# Patient Record
Sex: Male | Born: 1965 | Race: Black or African American | Hispanic: No | Marital: Single | Smoking: Current some day smoker
Health system: Southern US, Community
[De-identification: ages and names within clinical notes are randomized; demographics above are authoritative.]

## PROBLEM LIST (undated history)

## (undated) ENCOUNTER — Ambulatory Visit: Payer: Medicaid Other | Source: Home / Self Care

## (undated) DIAGNOSIS — R001 Bradycardia, unspecified: Secondary | ICD-10-CM

## (undated) DIAGNOSIS — I10 Essential (primary) hypertension: Secondary | ICD-10-CM

## (undated) DIAGNOSIS — R296 Repeated falls: Secondary | ICD-10-CM

## (undated) DIAGNOSIS — M1990 Unspecified osteoarthritis, unspecified site: Secondary | ICD-10-CM

## (undated) DIAGNOSIS — R600 Localized edema: Secondary | ICD-10-CM

## (undated) DIAGNOSIS — K439 Ventral hernia without obstruction or gangrene: Secondary | ICD-10-CM

## (undated) DIAGNOSIS — D649 Anemia, unspecified: Secondary | ICD-10-CM

## (undated) DIAGNOSIS — R0600 Dyspnea, unspecified: Secondary | ICD-10-CM

## (undated) DIAGNOSIS — K219 Gastro-esophageal reflux disease without esophagitis: Secondary | ICD-10-CM

## (undated) HISTORY — PX: ABDOMINAL SURGERY: SHX537

---

## 1997-06-26 ENCOUNTER — Emergency Department (HOSPITAL_COMMUNITY): Admission: EM | Admit: 1997-06-26 | Discharge: 1997-06-26 | Payer: Self-pay | Admitting: Emergency Medicine

## 1997-11-24 ENCOUNTER — Emergency Department (HOSPITAL_COMMUNITY): Admission: EM | Admit: 1997-11-24 | Discharge: 1997-11-24 | Payer: Self-pay | Admitting: Emergency Medicine

## 1999-07-16 ENCOUNTER — Emergency Department (HOSPITAL_COMMUNITY): Admission: EM | Admit: 1999-07-16 | Discharge: 1999-07-16 | Payer: Self-pay | Admitting: Emergency Medicine

## 1999-07-16 ENCOUNTER — Encounter: Payer: Self-pay | Admitting: Emergency Medicine

## 1999-08-19 ENCOUNTER — Emergency Department (HOSPITAL_COMMUNITY): Admission: EM | Admit: 1999-08-19 | Discharge: 1999-08-19 | Payer: Self-pay | Admitting: *Deleted

## 1999-08-31 ENCOUNTER — Emergency Department (HOSPITAL_COMMUNITY): Admission: EM | Admit: 1999-08-31 | Discharge: 1999-08-31 | Payer: Self-pay | Admitting: Emergency Medicine

## 1999-08-31 ENCOUNTER — Encounter: Payer: Self-pay | Admitting: Emergency Medicine

## 2002-01-07 ENCOUNTER — Emergency Department (HOSPITAL_COMMUNITY): Admission: EM | Admit: 2002-01-07 | Discharge: 2002-01-07 | Payer: Self-pay | Admitting: Emergency Medicine

## 2002-01-07 ENCOUNTER — Encounter: Payer: Self-pay | Admitting: *Deleted

## 2002-10-17 ENCOUNTER — Emergency Department (HOSPITAL_COMMUNITY): Admission: EM | Admit: 2002-10-17 | Discharge: 2002-10-17 | Payer: Self-pay | Admitting: Emergency Medicine

## 2003-02-15 ENCOUNTER — Emergency Department (HOSPITAL_COMMUNITY): Admission: EM | Admit: 2003-02-15 | Discharge: 2003-02-16 | Payer: Self-pay | Admitting: Emergency Medicine

## 2003-02-20 ENCOUNTER — Emergency Department (HOSPITAL_COMMUNITY): Admission: EM | Admit: 2003-02-20 | Discharge: 2003-02-21 | Payer: Self-pay | Admitting: Emergency Medicine
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
## 2003-03-14 ENCOUNTER — Emergency Department (HOSPITAL_COMMUNITY): Admission: EM | Admit: 2003-03-14 | Discharge: 2003-03-14 | Payer: Self-pay | Admitting: Emergency Medicine
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
## 2003-05-16 ENCOUNTER — Emergency Department (HOSPITAL_COMMUNITY): Admission: EM | Admit: 2003-05-16 | Discharge: 2003-05-16 | Payer: Self-pay | Admitting: Emergency Medicine

## 2003-08-21 ENCOUNTER — Emergency Department (HOSPITAL_COMMUNITY): Admission: EM | Admit: 2003-08-21 | Discharge: 2003-08-21 | Payer: Self-pay | Admitting: Emergency Medicine

## 2003-08-21 IMAGING — CR DG ABDOMEN ACUTE W/ 1V CHEST
3 series · 3 of 3 positions shown · non-contrast
Comparison: report from prior chest radiograph from [DATE].

CLINICAL DATA: lower abdominal pain
 ACUTE ABDOMINAL SERIES (INCLUDING ONE VIEW CHEST) [DATE]

[view not recorded (1 of 3)]
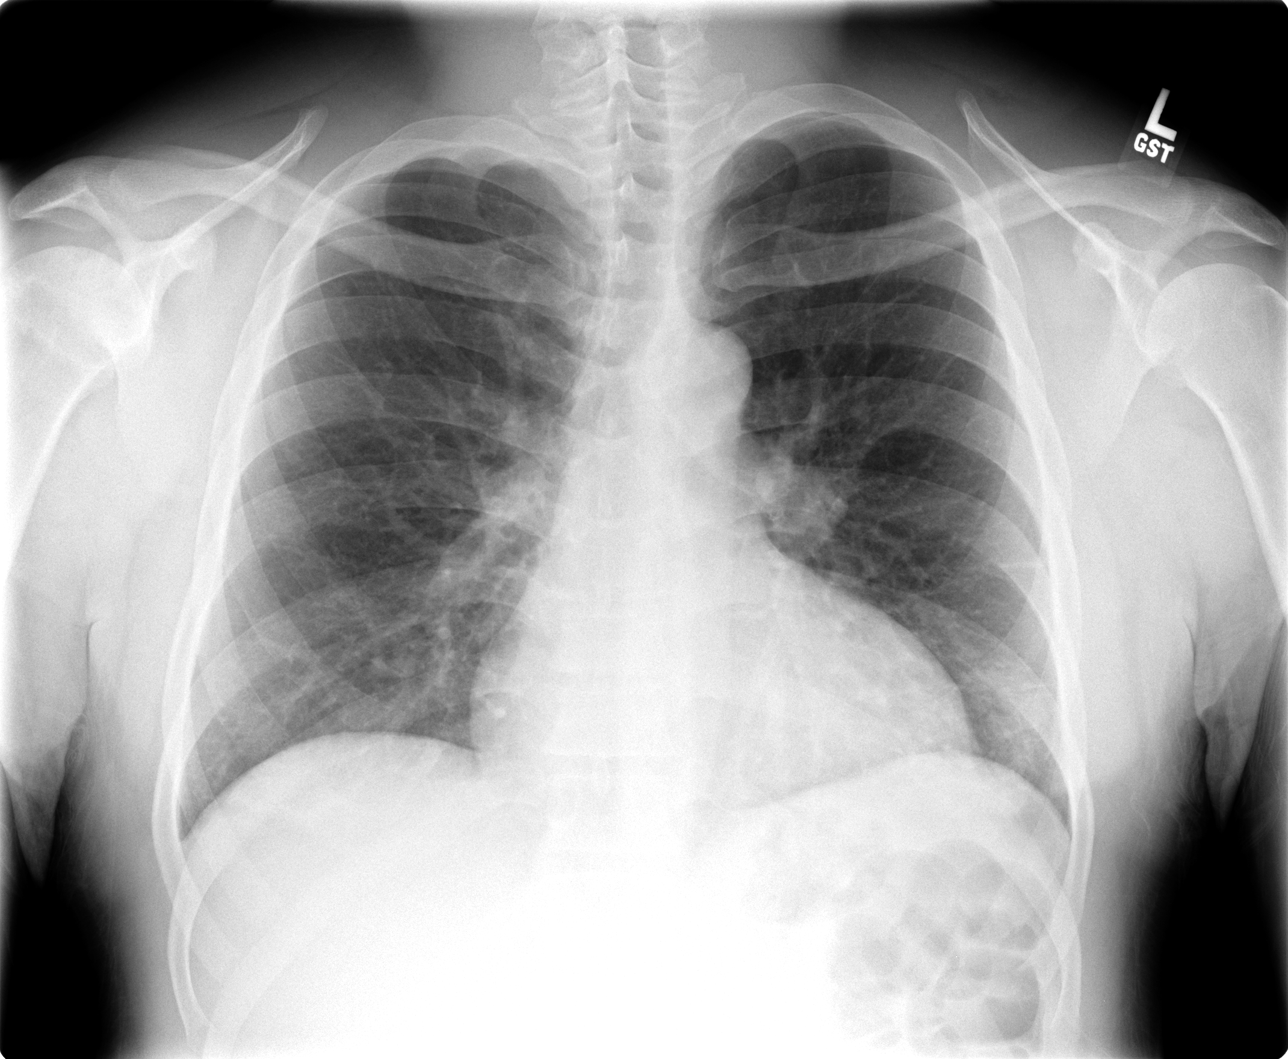

[view not recorded (2 of 3)]
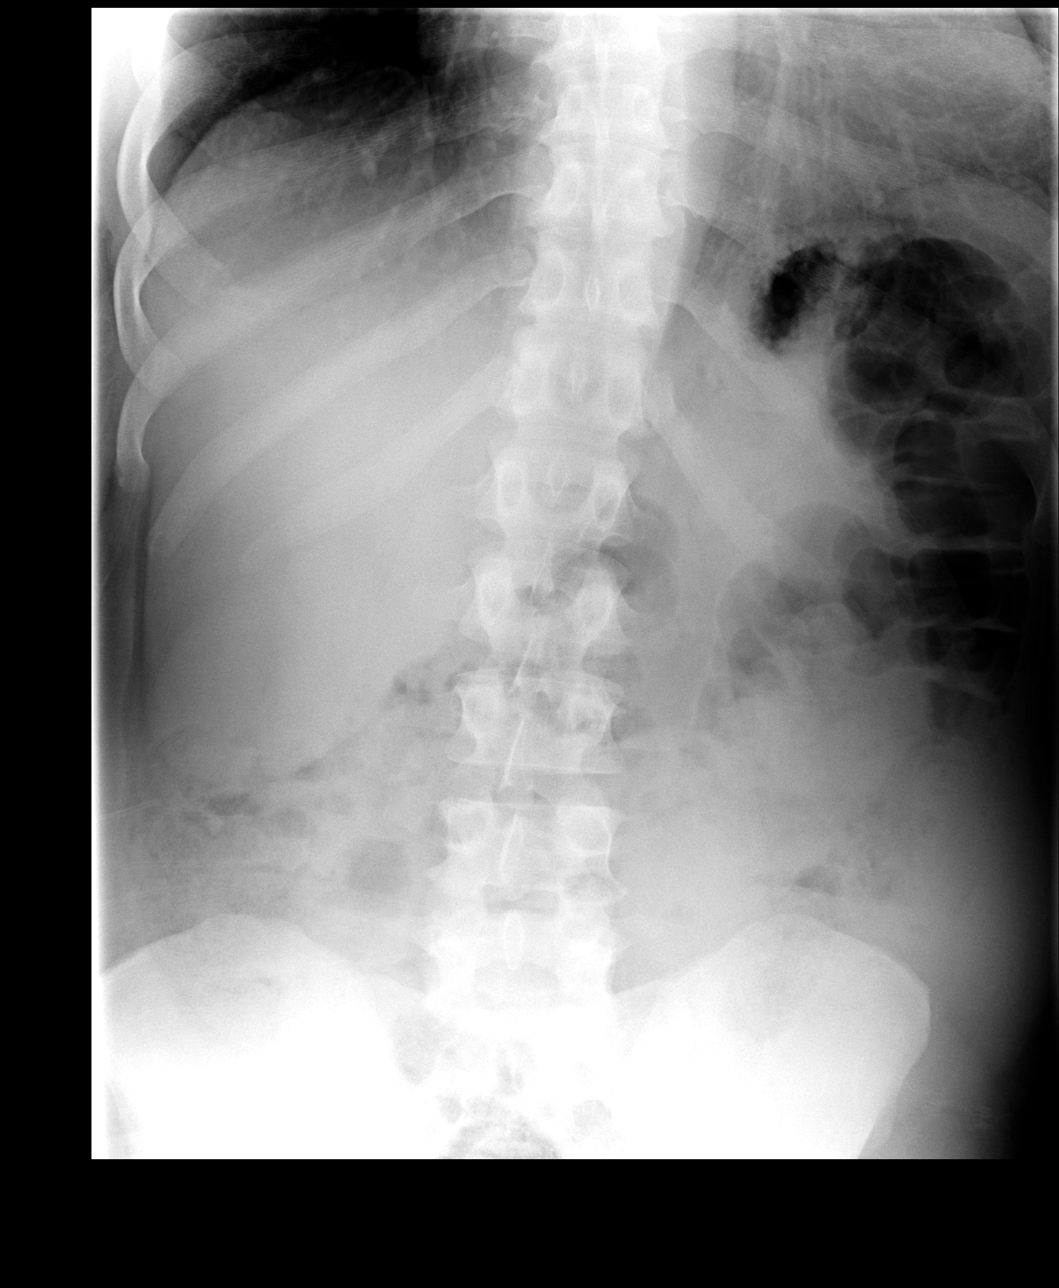

[view not recorded (3 of 3)]
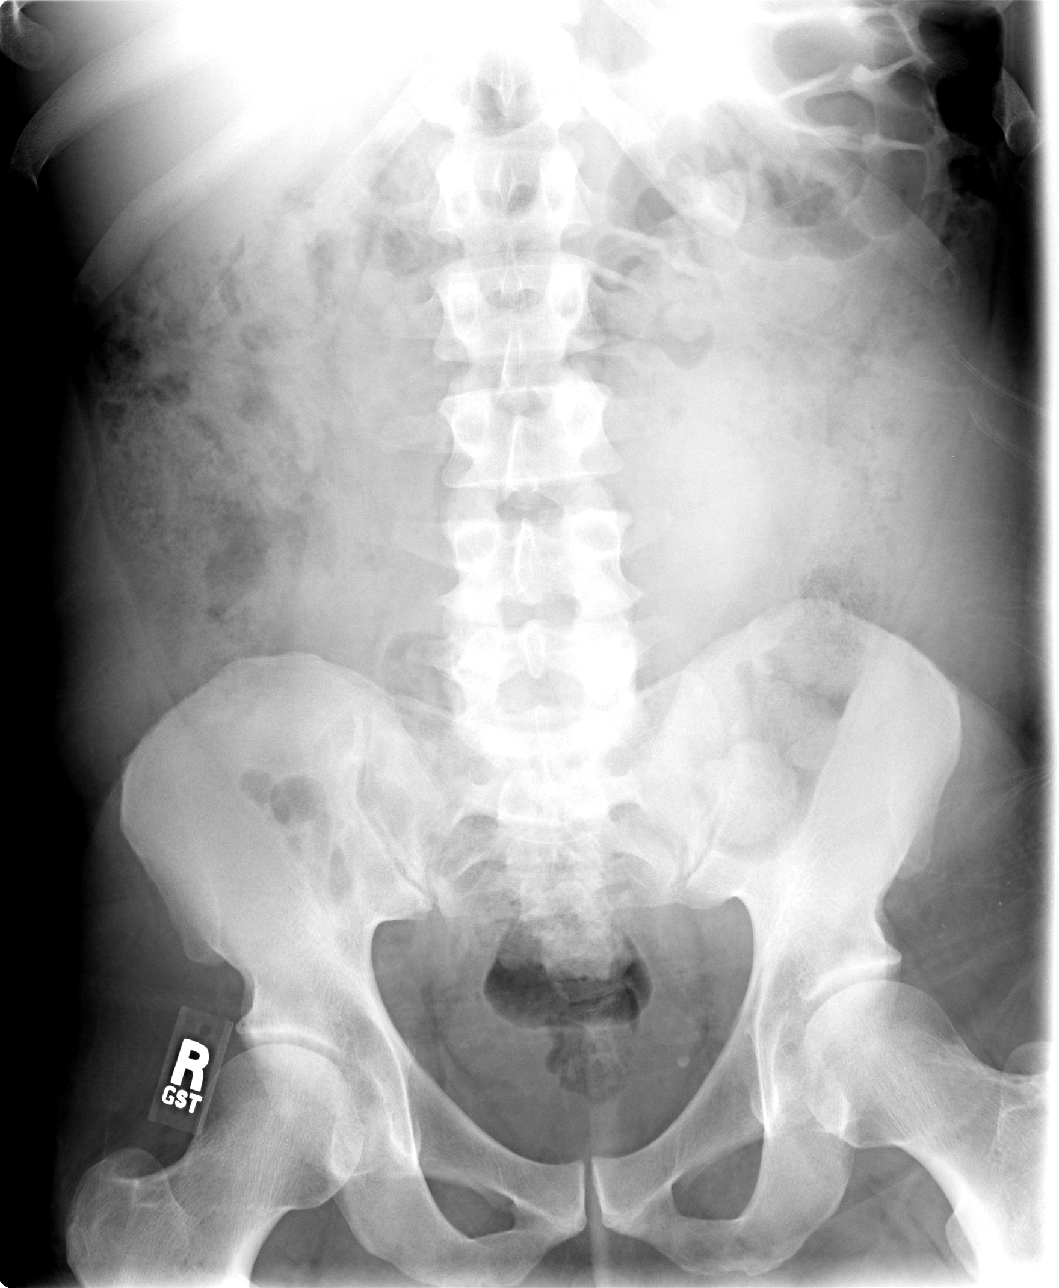

[3 of 3 positions shown; findings below may reference images not displayed]

FINDINGS: Mild cardiomegaly is present.  The lungs appear clear.  No free intraperitoneal gas is evident beneath the hemidiaphragms.
 Upright view of the abdomen demonstrates a moderate amount of stool throughout the colon consistent with constipation.  No dilated small bowel or abnormal air-fluid levels.  No significant abnormal calcific density is identified.  
 IMPRESSION
 Prominent stool throughout the colon most consistent with constipation.

## 2004-06-24 ENCOUNTER — Inpatient Hospital Stay (HOSPITAL_COMMUNITY): Admission: EM | Admit: 2004-06-24 | Discharge: 2004-06-28 | Payer: Self-pay | Admitting: Emergency Medicine

## 2004-06-24 IMAGING — CT CT PELVIS W/O CM
1 series · 15 of 32 positions shown, 19 images · IV contrast (agent unspecified)
Comparison: none

CLINICAL DATA: Abdominal pain and nausea and vomiting.  Previous history of small bowel obstructions.
TECHNIQUE: Multidetector CT imaging of the abdomen and pelvis was performed following the standard protocol without IV contrast.   Intravenous contrast could not be administered due to lack of IV access in this patient. Oral contrast was administered.  
ABDOMEN CT WITHOUT CONTRAST:
The abdominal parenchymal organs are unremarkable in appearance.  The gallbladder is also normal.  Motion artifact is seen through the lower portion of the abdomen.  No masses identified.  
A midline periumbilical anterior abdominal wall hernia is seen containing a loop of small bowel. There is mild dilatation of small bowel loops proximally, indicating a partial small bowel obstruction.   Several sets of delayed images were obtained to allow for more complete bowel opacification by oral contrast.  There is wall thickening involving the ascending colon above the level of the ileocecal valve and extending into the hepatic flexure.  There are also inflammatory changes in the pericolonic fat medial to the ascending colon.  This is consistent with right-sided diverticulitis or other colitis.

[Series 2: routine abdomen · axial · 0.70mm/px · z∈[-292,-82]mm · 15 of 46 slices shown, 19 images]
[im 3/46  soft-tissue]
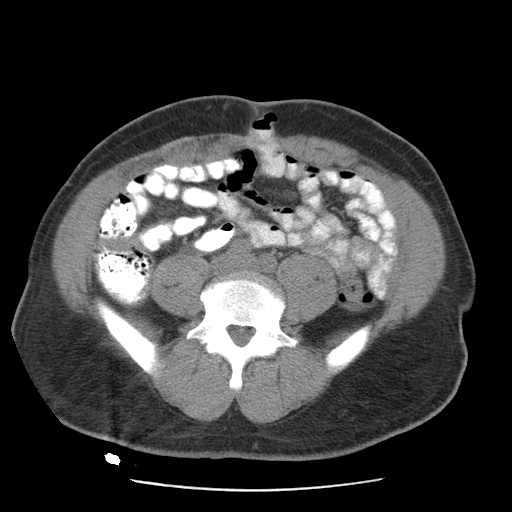
[im 3/46  bone]
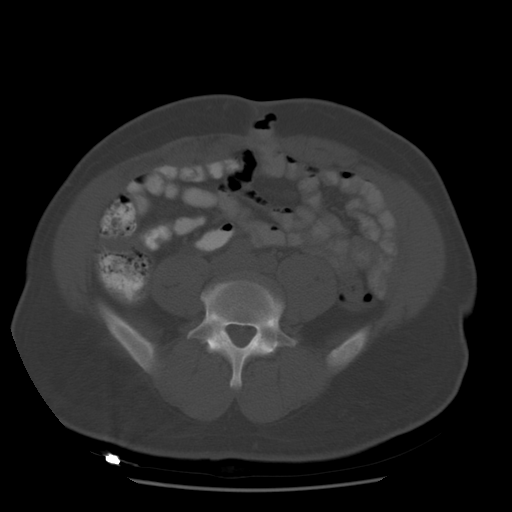
[im 6/46  soft-tissue]
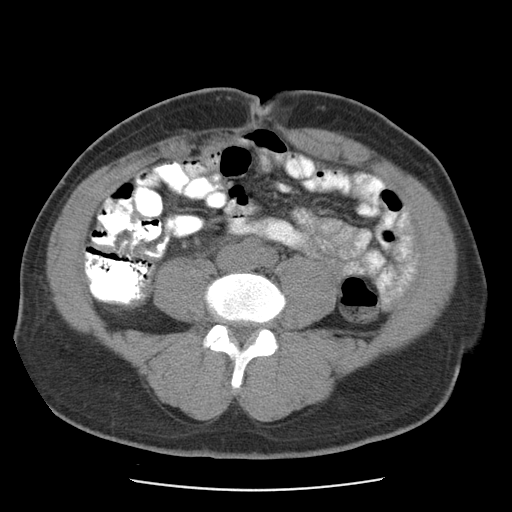
[im 9/46  soft-tissue]
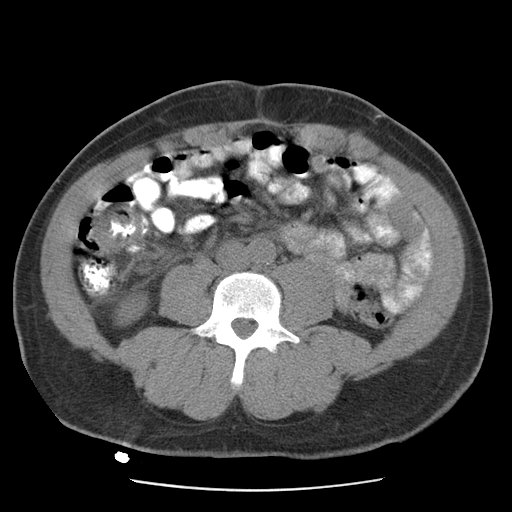
[im 14/46  soft-tissue]
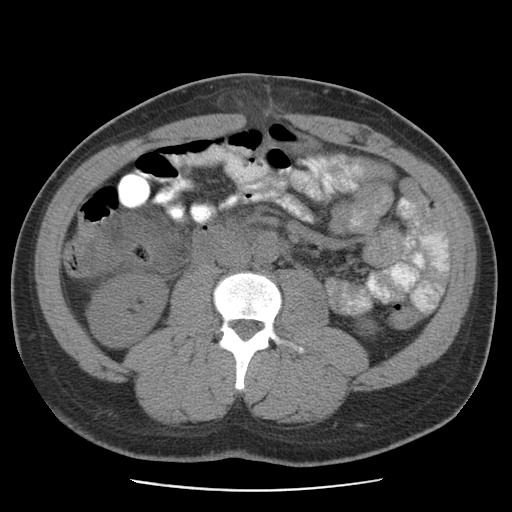
[im 16/46  soft-tissue]
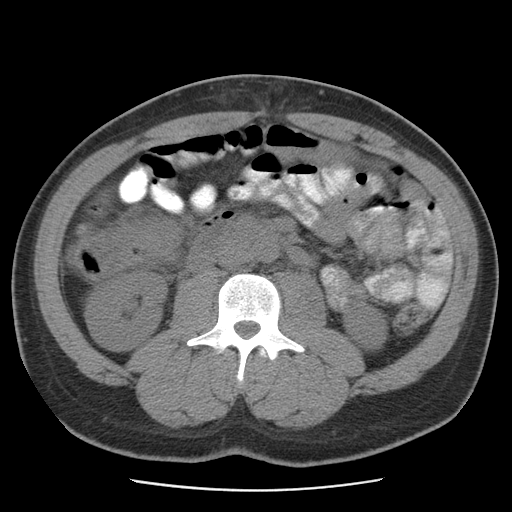
[im 19/46  soft-tissue]
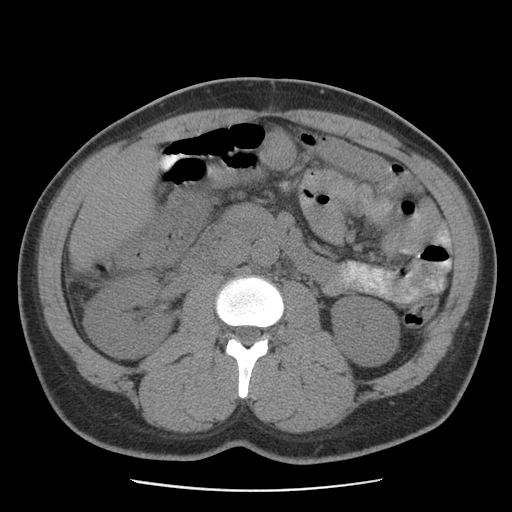
[im 24/46  soft-tissue]
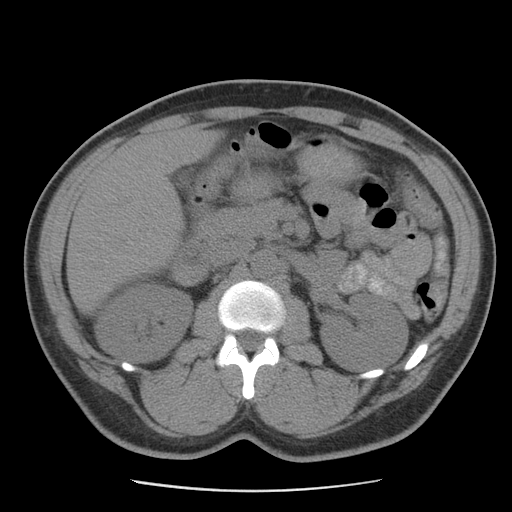
[im 27/46  soft-tissue]
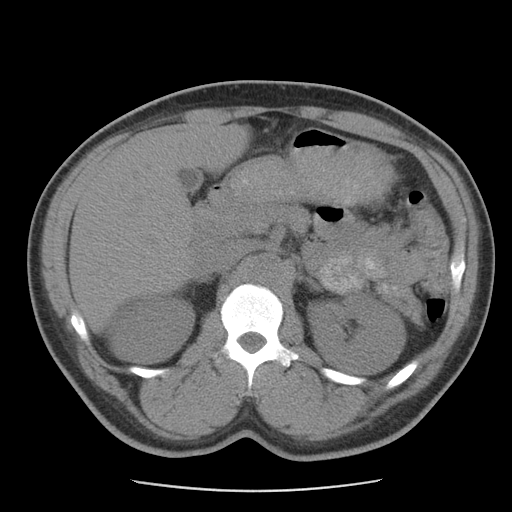
[im 30/46  soft-tissue]
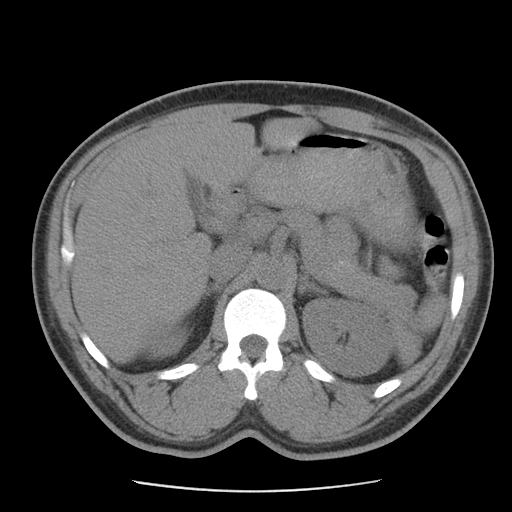
[im 30/46  bone]
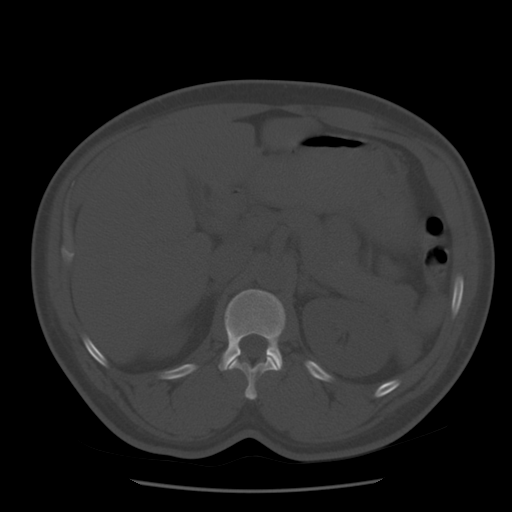
[im 32/46  soft-tissue]
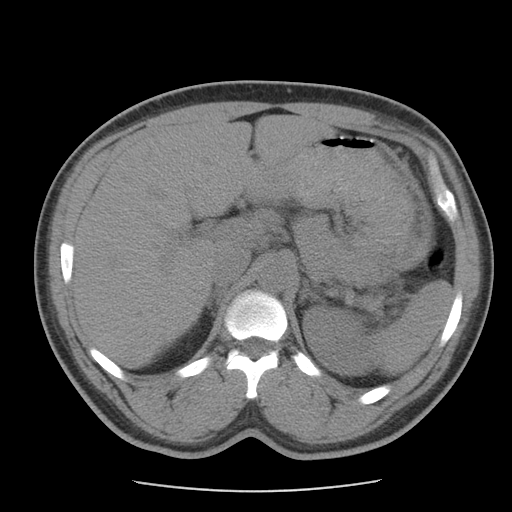
[im 37/46  soft-tissue]
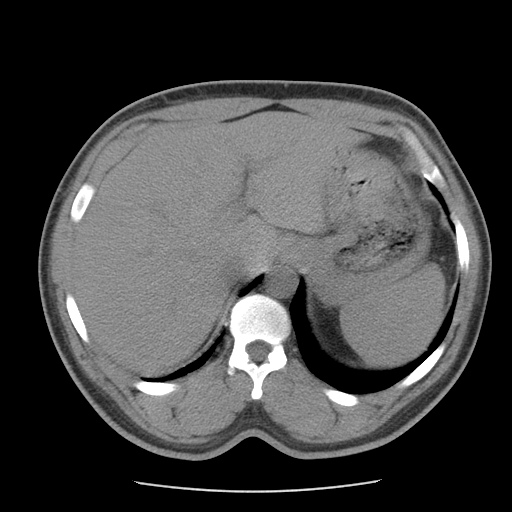
[im 40/46  soft-tissue]
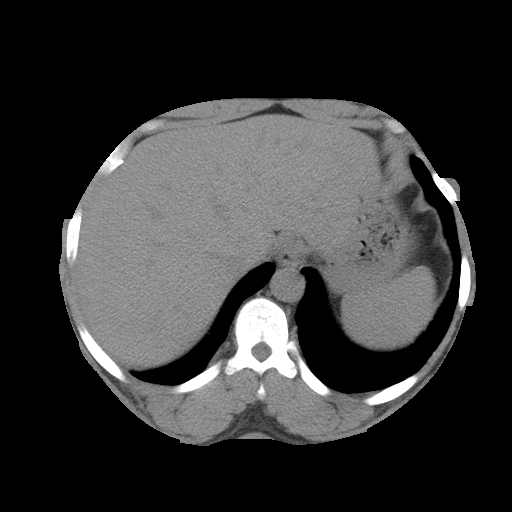
[im 40/46  lung]
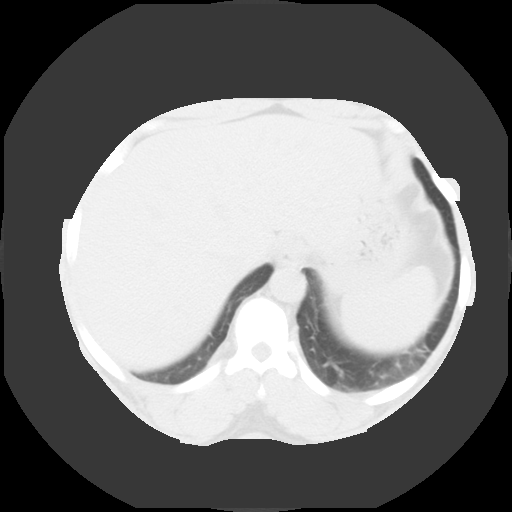
[im 41/46  lung]
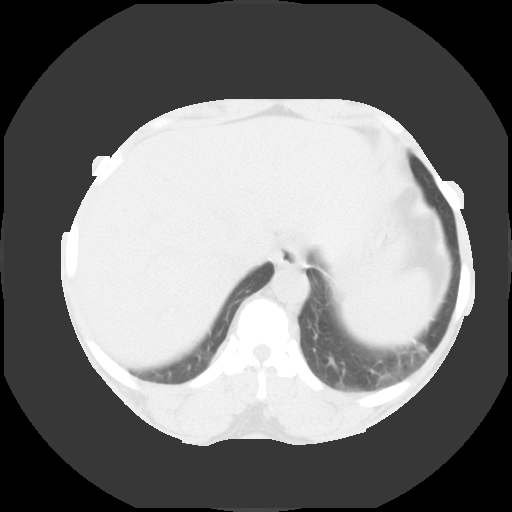
[im 43/46  soft-tissue]
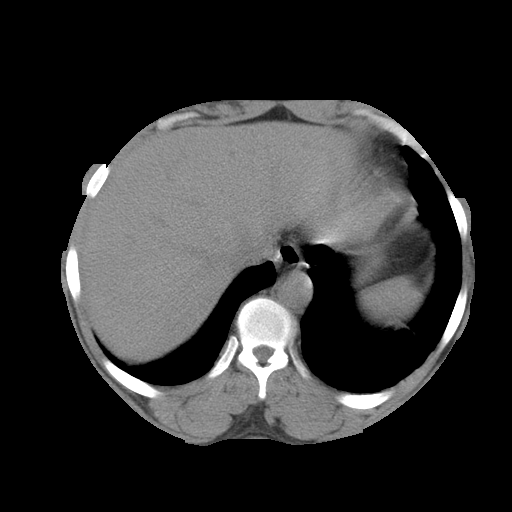
[im 43/46  lung]
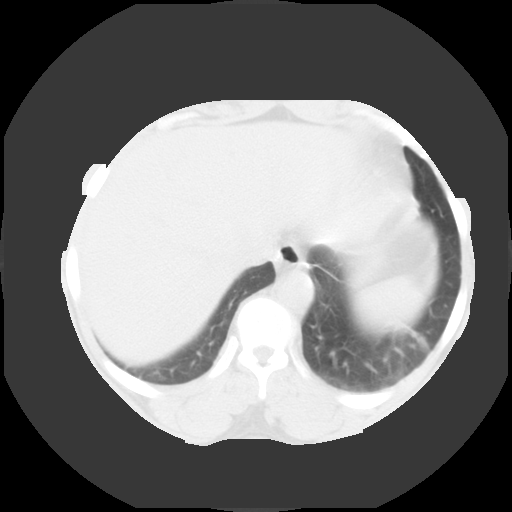
[im 44/46  lung]
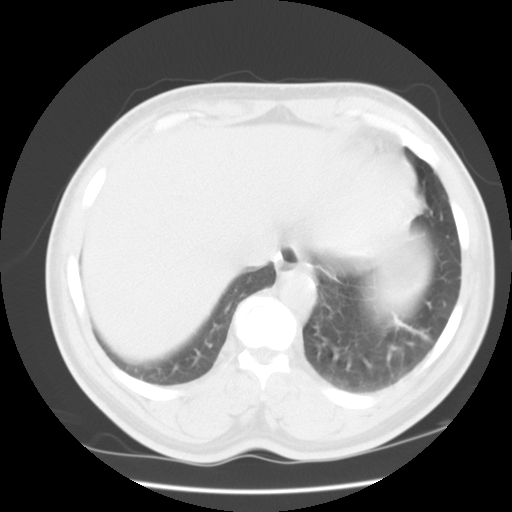

[15 of 32 positions shown; findings below may reference images not displayed]

IMPRESSION: 1.  Midline periumbilical anterior abdominal wall hernia containing a loop of small bowel.  This appears to result in partial small bowel obstruction.  
2.  Colonic wall thickening and pericolonic inflammatory changes involving the ascending colon and hepatic flexure.  This is consistent with diverticulitis or other cause of colitis.      
PELVIS CT WITHOUT CONTRAST:
There is no evidence of pelvic mass or inflammatory process.  No abnormal fluid collections are seen.
IMPRESSION: Negative pelvis CT.

## 2004-06-24 IMAGING — CT CT PELVIS W/O CM
1 series · 15 of 32 positions shown, 19 images · IV contrast (agent unspecified)
Comparison: none

CLINICAL DATA: Abdominal pain and nausea and vomiting.  Previous history of small bowel obstructions.
TECHNIQUE: Multidetector CT imaging of the abdomen and pelvis was performed following the standard protocol without IV contrast.   Intravenous contrast could not be administered due to lack of IV access in this patient. Oral contrast was administered.  
ABDOMEN CT WITHOUT CONTRAST:
The abdominal parenchymal organs are unremarkable in appearance.  The gallbladder is also normal.  Motion artifact is seen through the lower portion of the abdomen.  No masses identified.  
A midline periumbilical anterior abdominal wall hernia is seen containing a loop of small bowel. There is mild dilatation of small bowel loops proximally, indicating a partial small bowel obstruction.   Several sets of delayed images were obtained to allow for more complete bowel opacification by oral contrast.  There is wall thickening involving the ascending colon above the level of the ileocecal valve and extending into the hepatic flexure.  There are also inflammatory changes in the pericolonic fat medial to the ascending colon.  This is consistent with right-sided diverticulitis or other colitis.

[Series 2: routine abdomen · axial · 0.70mm/px · z∈[-444,-29]mm · 15 of 92 slices shown, 19 images]
[im 6/92  soft-tissue]
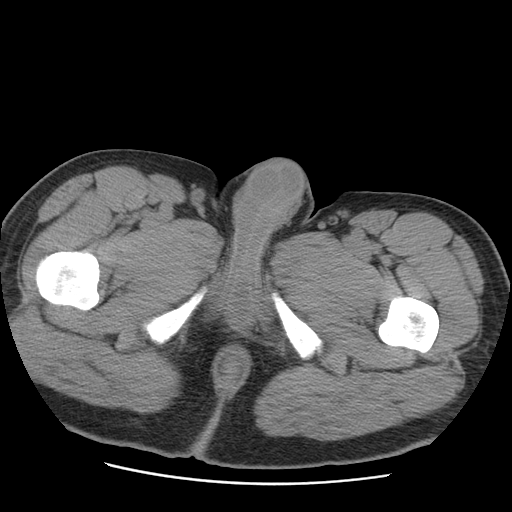
[im 6/92  bone]
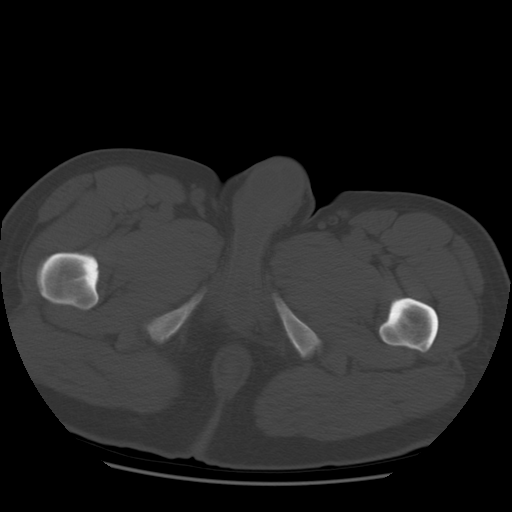
[im 12/92  soft-tissue]
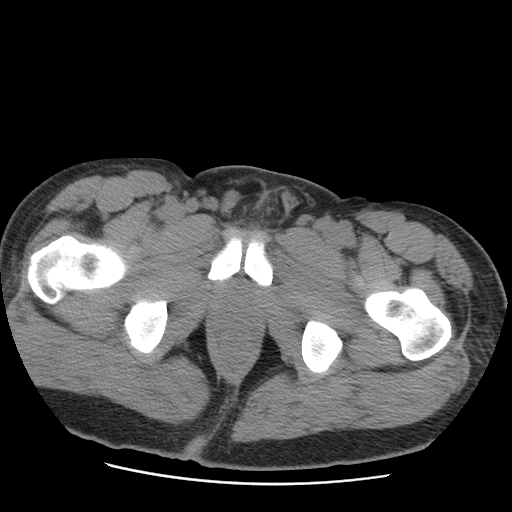
[im 18/92  soft-tissue]
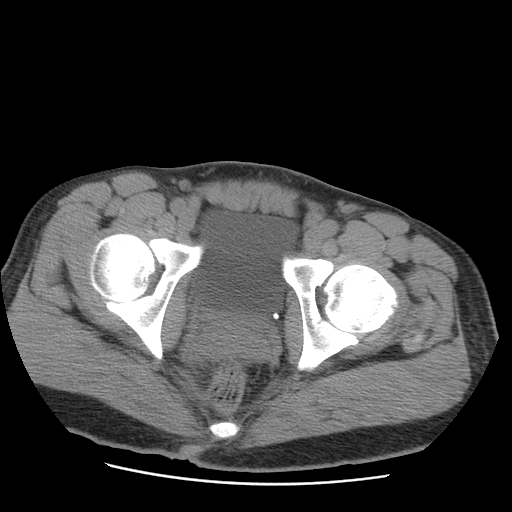
[im 27/92  soft-tissue]
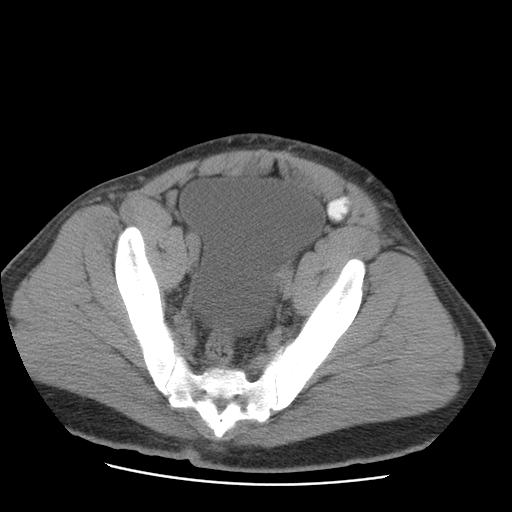
[im 33/92  soft-tissue]
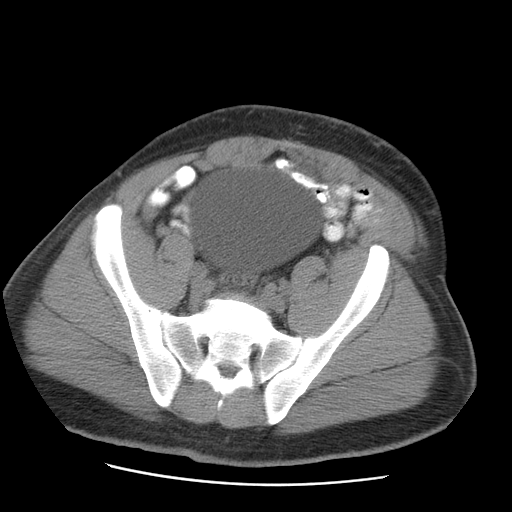
[im 39/92  soft-tissue]
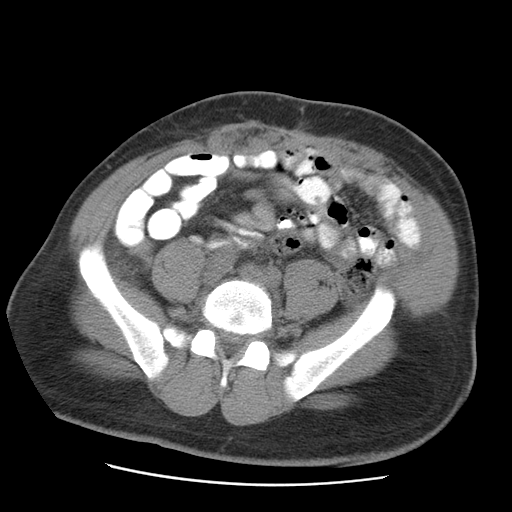
[im 47/92  soft-tissue]
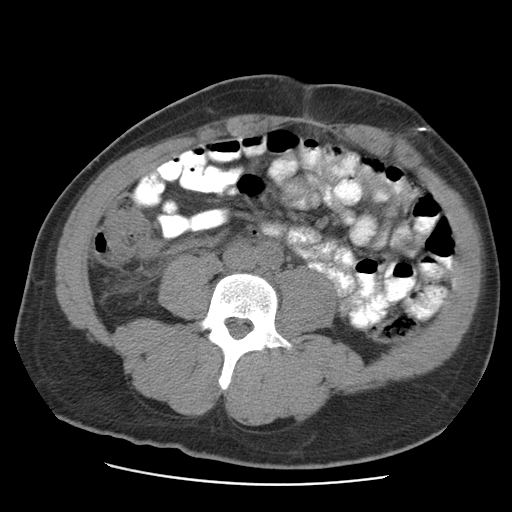
[im 53/92  soft-tissue]
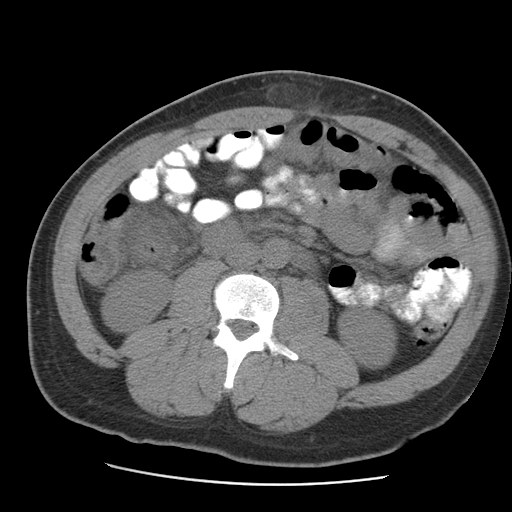
[im 59/92  soft-tissue]
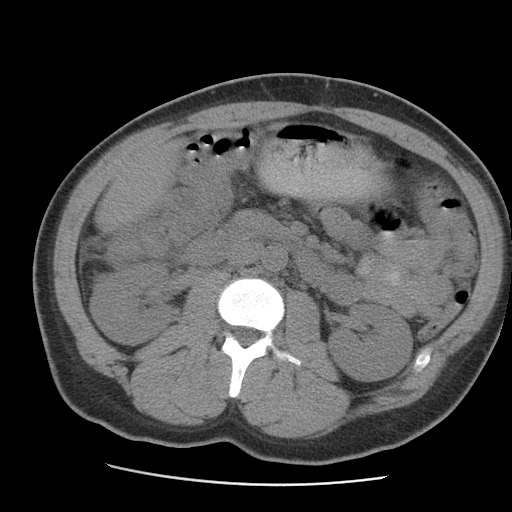
[im 59/92  bone]
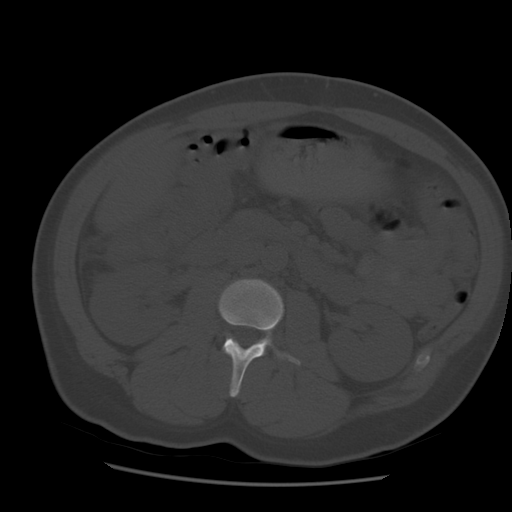
[im 65/92  soft-tissue]
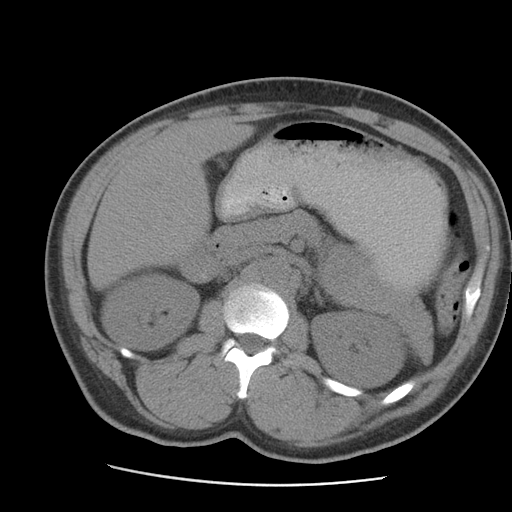
[im 74/92  soft-tissue]
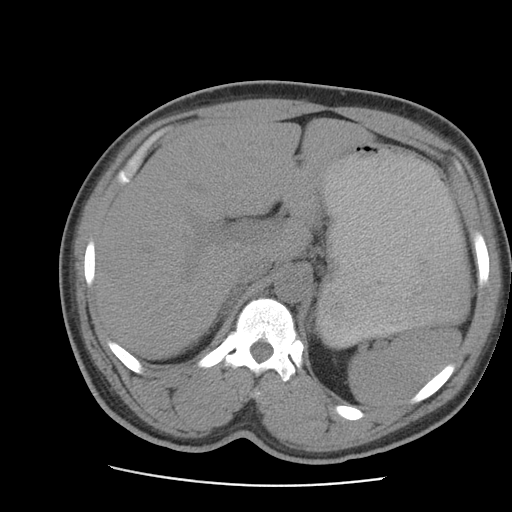
[im 80/92  soft-tissue]
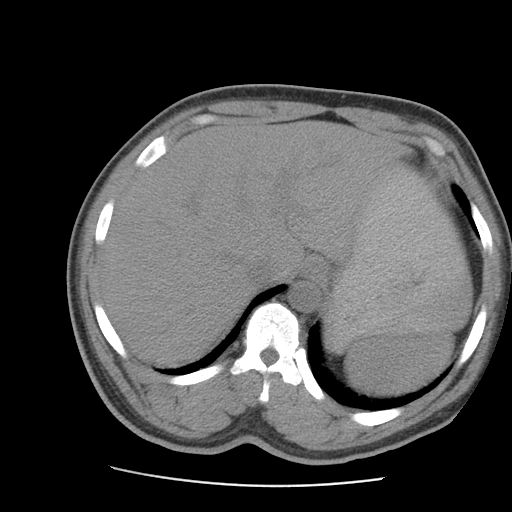
[im 80/92  lung]
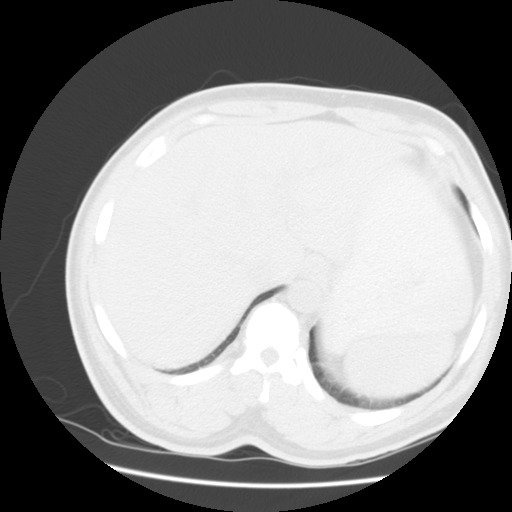
[im 83/92  lung]
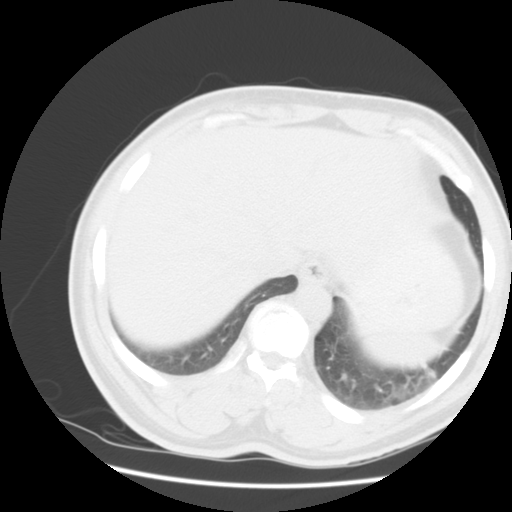
[im 86/92  soft-tissue]
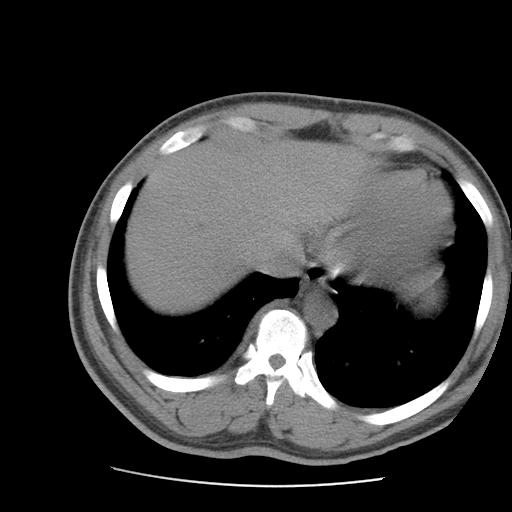
[im 86/92  lung]
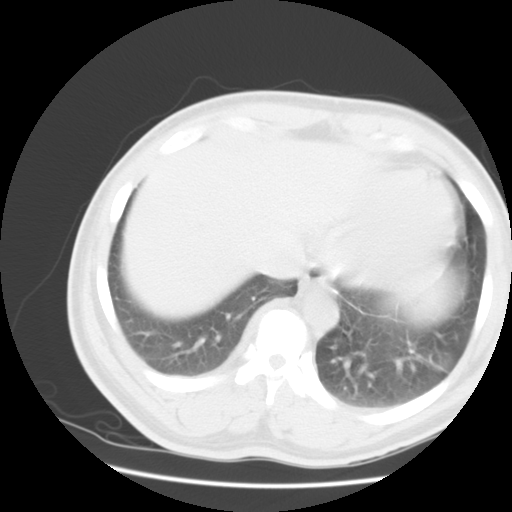
[im 89/92  lung]
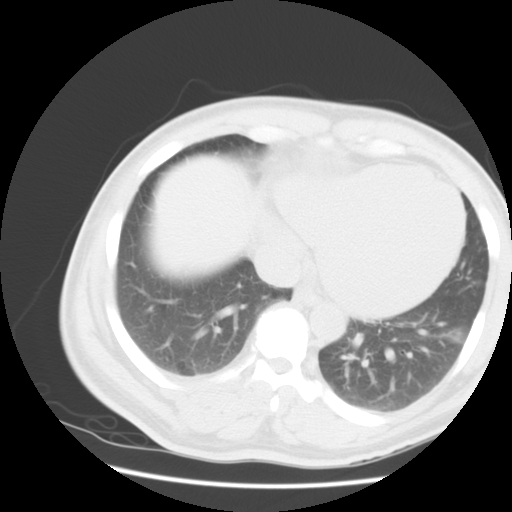

[15 of 32 positions shown; findings below may reference images not displayed]

IMPRESSION: 1.  Midline periumbilical anterior abdominal wall hernia containing a loop of small bowel.  This appears to result in partial small bowel obstruction.  
2.  Colonic wall thickening and pericolonic inflammatory changes involving the ascending colon and hepatic flexure.  This is consistent with diverticulitis or other cause of colitis.      
PELVIS CT WITHOUT CONTRAST:
There is no evidence of pelvic mass or inflammatory process.  No abnormal fluid collections are seen.
IMPRESSION: Negative pelvis CT.

## 2004-06-24 IMAGING — CT CT PELVIS W/O CM
1 of 2 series · 15 of 32 positions shown, 19 images · IV contrast (ORAL OMNI 350)
Comparison: none

CLINICAL DATA: Abdominal pain and nausea and vomiting.  Previous history of small bowel obstructions.
TECHNIQUE: Multidetector CT imaging of the abdomen and pelvis was performed following the standard protocol without IV contrast.   Intravenous contrast could not be administered due to lack of IV access in this patient. Oral contrast was administered.  
ABDOMEN CT WITHOUT CONTRAST:
The abdominal parenchymal organs are unremarkable in appearance.  The gallbladder is also normal.  Motion artifact is seen through the lower portion of the abdomen.  No masses identified.  
A midline periumbilical anterior abdominal wall hernia is seen containing a loop of small bowel. There is mild dilatation of small bowel loops proximally, indicating a partial small bowel obstruction.   Several sets of delayed images were obtained to allow for more complete bowel opacification by oral contrast.  There is wall thickening involving the ascending colon above the level of the ileocecal valve and extending into the hepatic flexure.  There are also inflammatory changes in the pericolonic fat medial to the ascending colon.  This is consistent with right-sided diverticulitis or other colitis.

[Series 2: routine abdomen · axial · 0.74mm/px · z∈[-519,-139]mm · 15 of 96 slices shown, 19 images]
[im 5/96  soft-tissue]
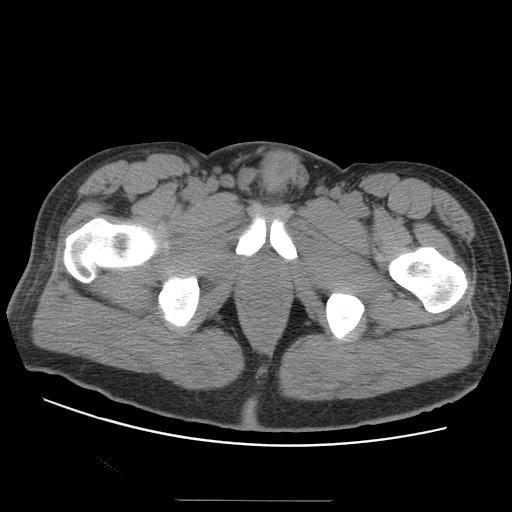
[im 5/96  bone]
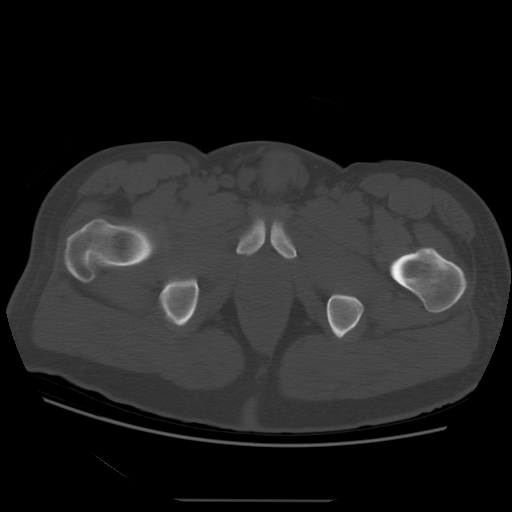
[im 13/96  soft-tissue]
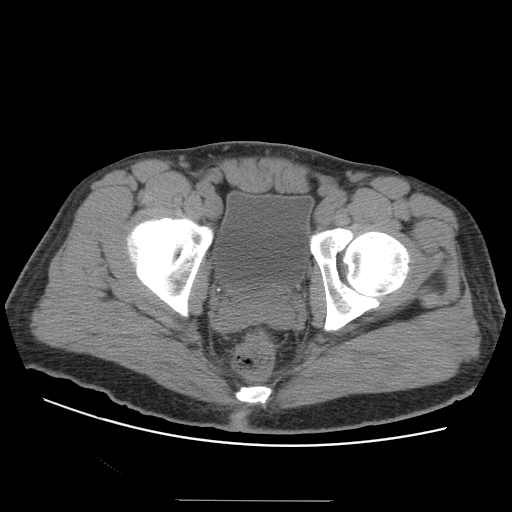
[im 22/96  soft-tissue]
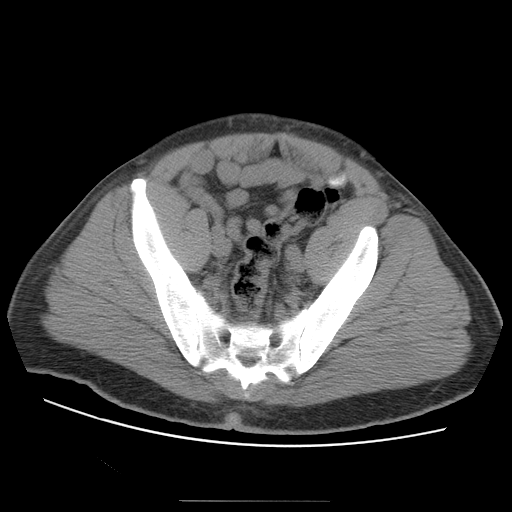
[im 26/96  soft-tissue]
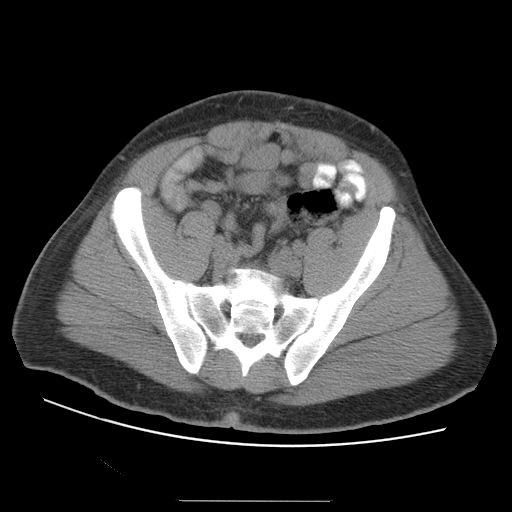
[im 35/96  soft-tissue]
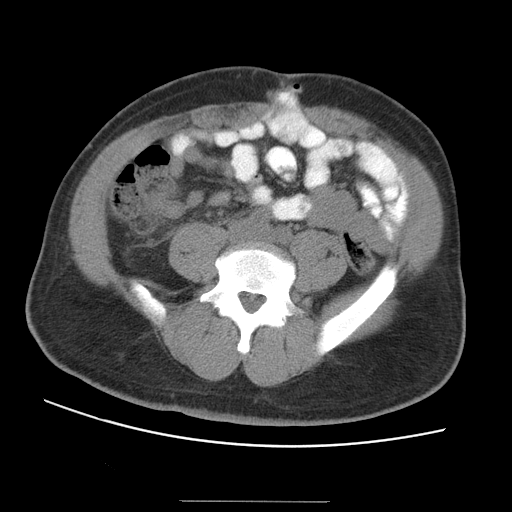
[im 39/96  soft-tissue]
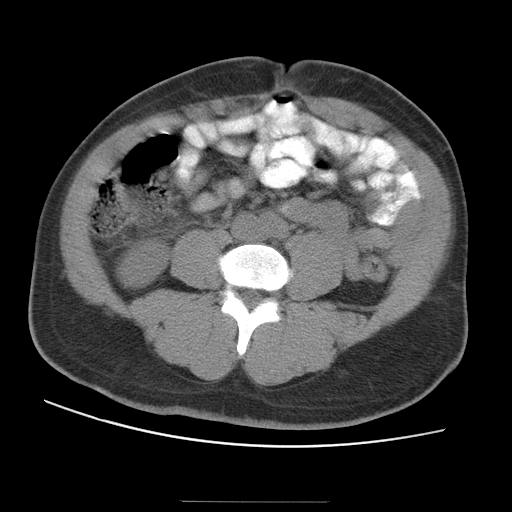
[im 48/96  soft-tissue]
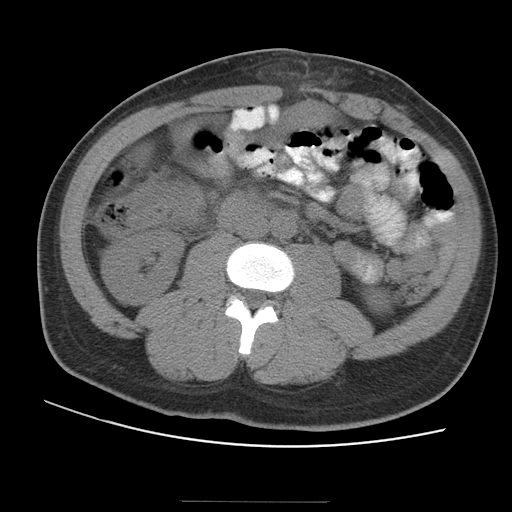
[im 57/96  soft-tissue]
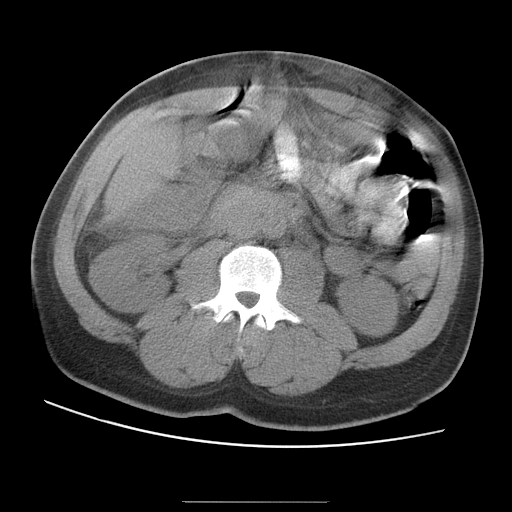
[im 61/96  soft-tissue]
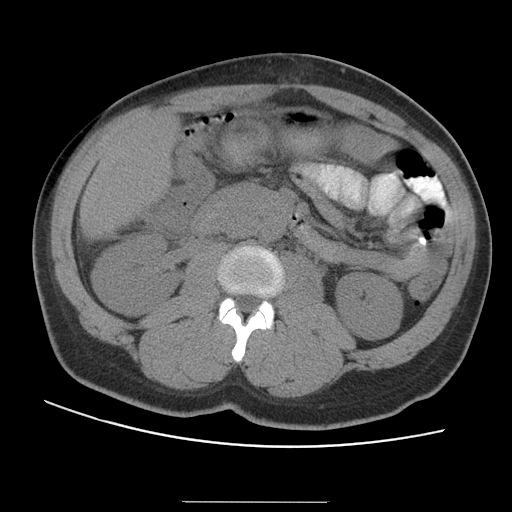
[im 61/96  bone]
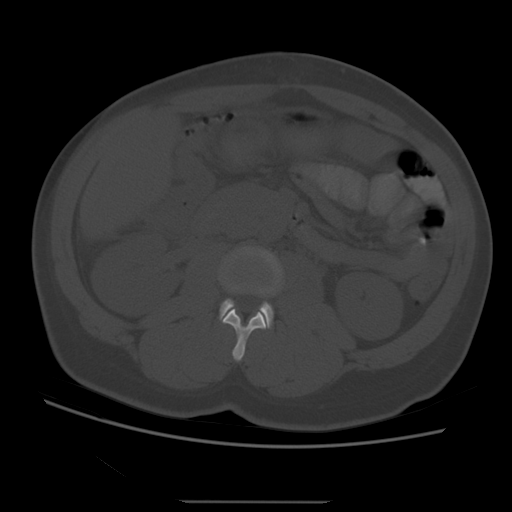
[im 70/96  soft-tissue]
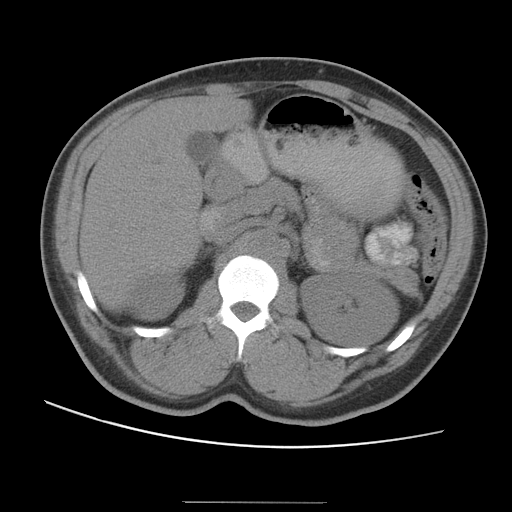
[im 74/96  soft-tissue]
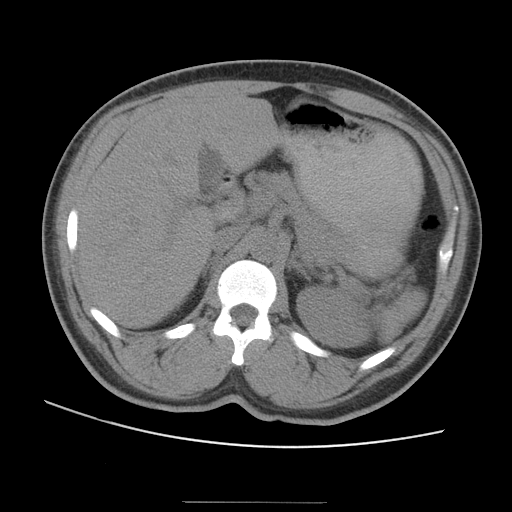
[im 78/96  lung]
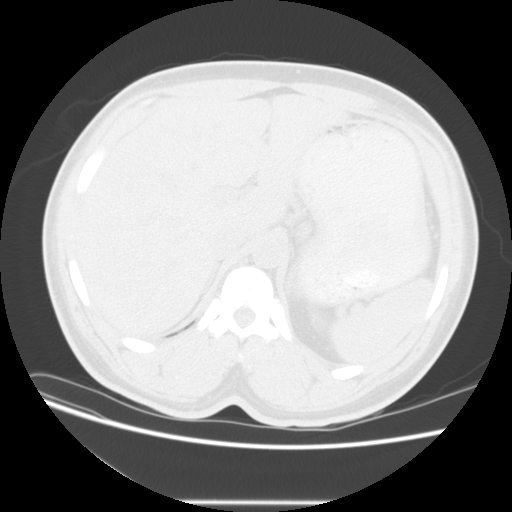
[im 83/96  soft-tissue]
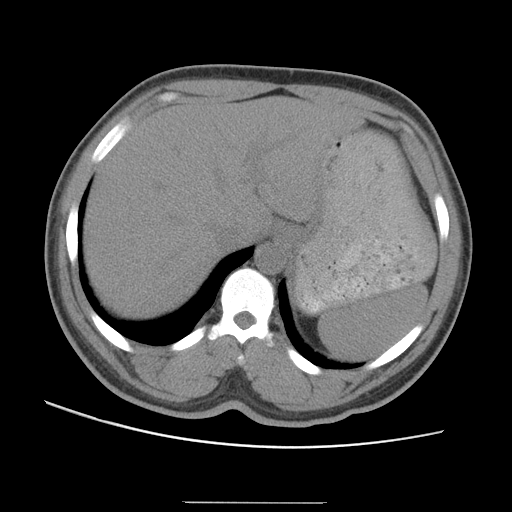
[im 83/96  lung]
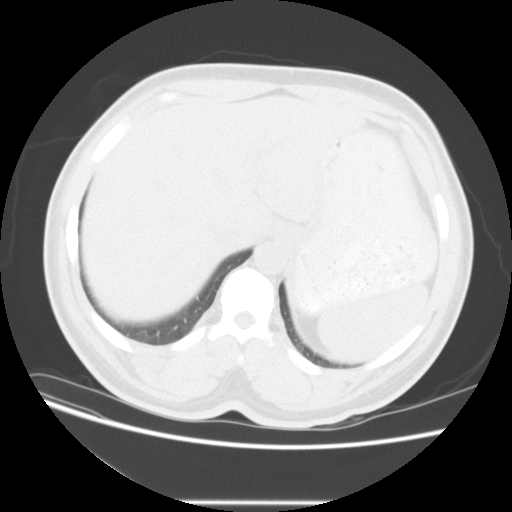
[im 87/96  lung]
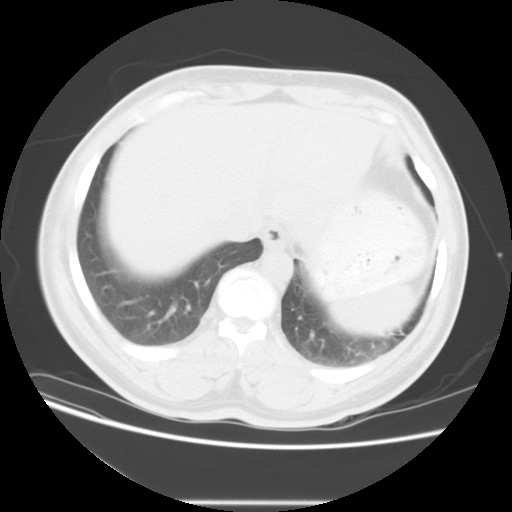
[im 91/96  soft-tissue]
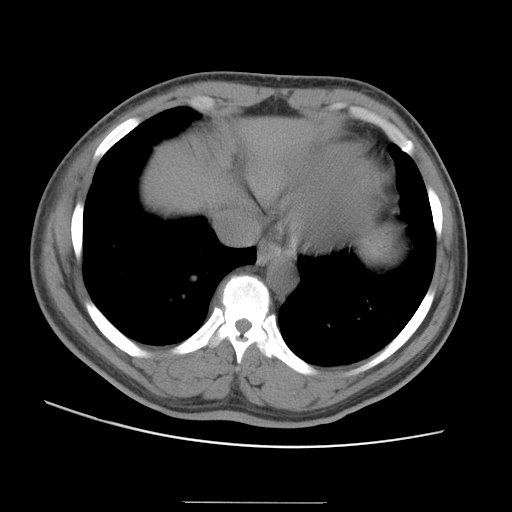
[im 91/96  lung]
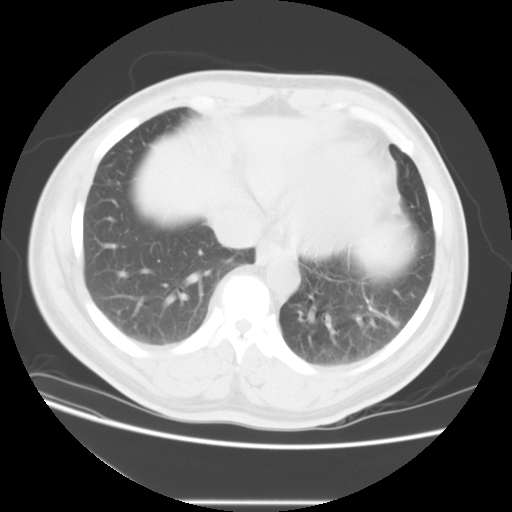

[15 of 32 positions shown; findings below may reference images not displayed]

IMPRESSION: 1.  Midline periumbilical anterior abdominal wall hernia containing a loop of small bowel.  This appears to result in partial small bowel obstruction.  
2.  Colonic wall thickening and pericolonic inflammatory changes involving the ascending colon and hepatic flexure.  This is consistent with diverticulitis or other cause of colitis.      
PELVIS CT WITHOUT CONTRAST:
There is no evidence of pelvic mass or inflammatory process.  No abnormal fluid collections are seen.
IMPRESSION: Negative pelvis CT.

## 2004-06-25 IMAGING — CR DG ABDOMEN 2V
2 series · 2 of 2 positions shown · non-contrast
Comparison: none

CLINICAL DATA: Partial small bowel obstruction.  History of diverticulitis.  
 ABDOMEN ? 2 VIEW:
 Flat and erect films of the abdomen are made and are compared to the previous CT scan of [DATE] and now show the contrast from the CT scan to have passed throughout the small bowel and into the sigmoid colon and rectum.  No definite small bowel obstruction is seen at this time.  There is mild ileus and a moderate amount of feces in the right colon.  No free air is seen beneath the diaphragm.  No abdominal mass or abnormal calcification is seen.  The bones of the lumbar spine and pelvis appear normal.

[w abdomen upright *]
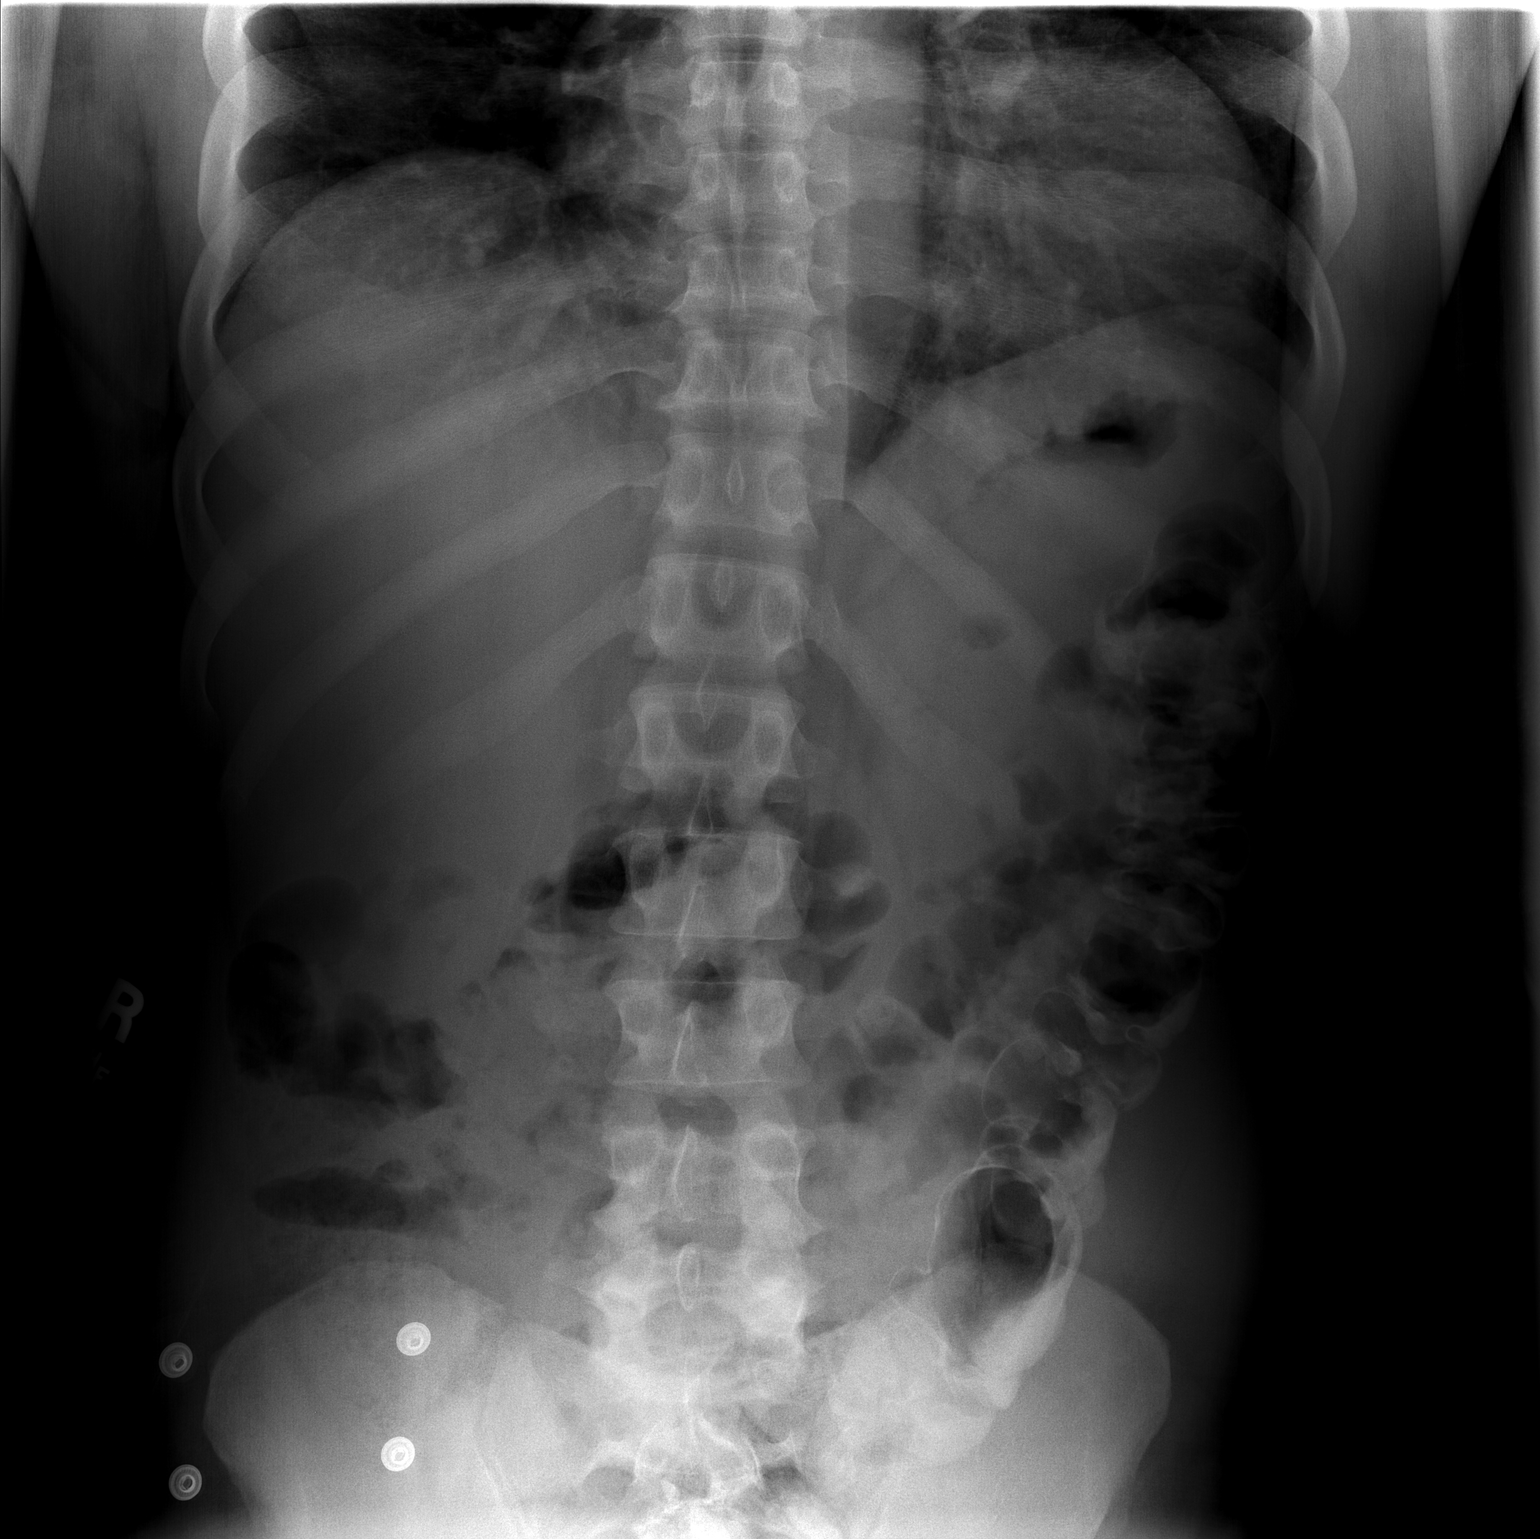

[t abdomen supine]
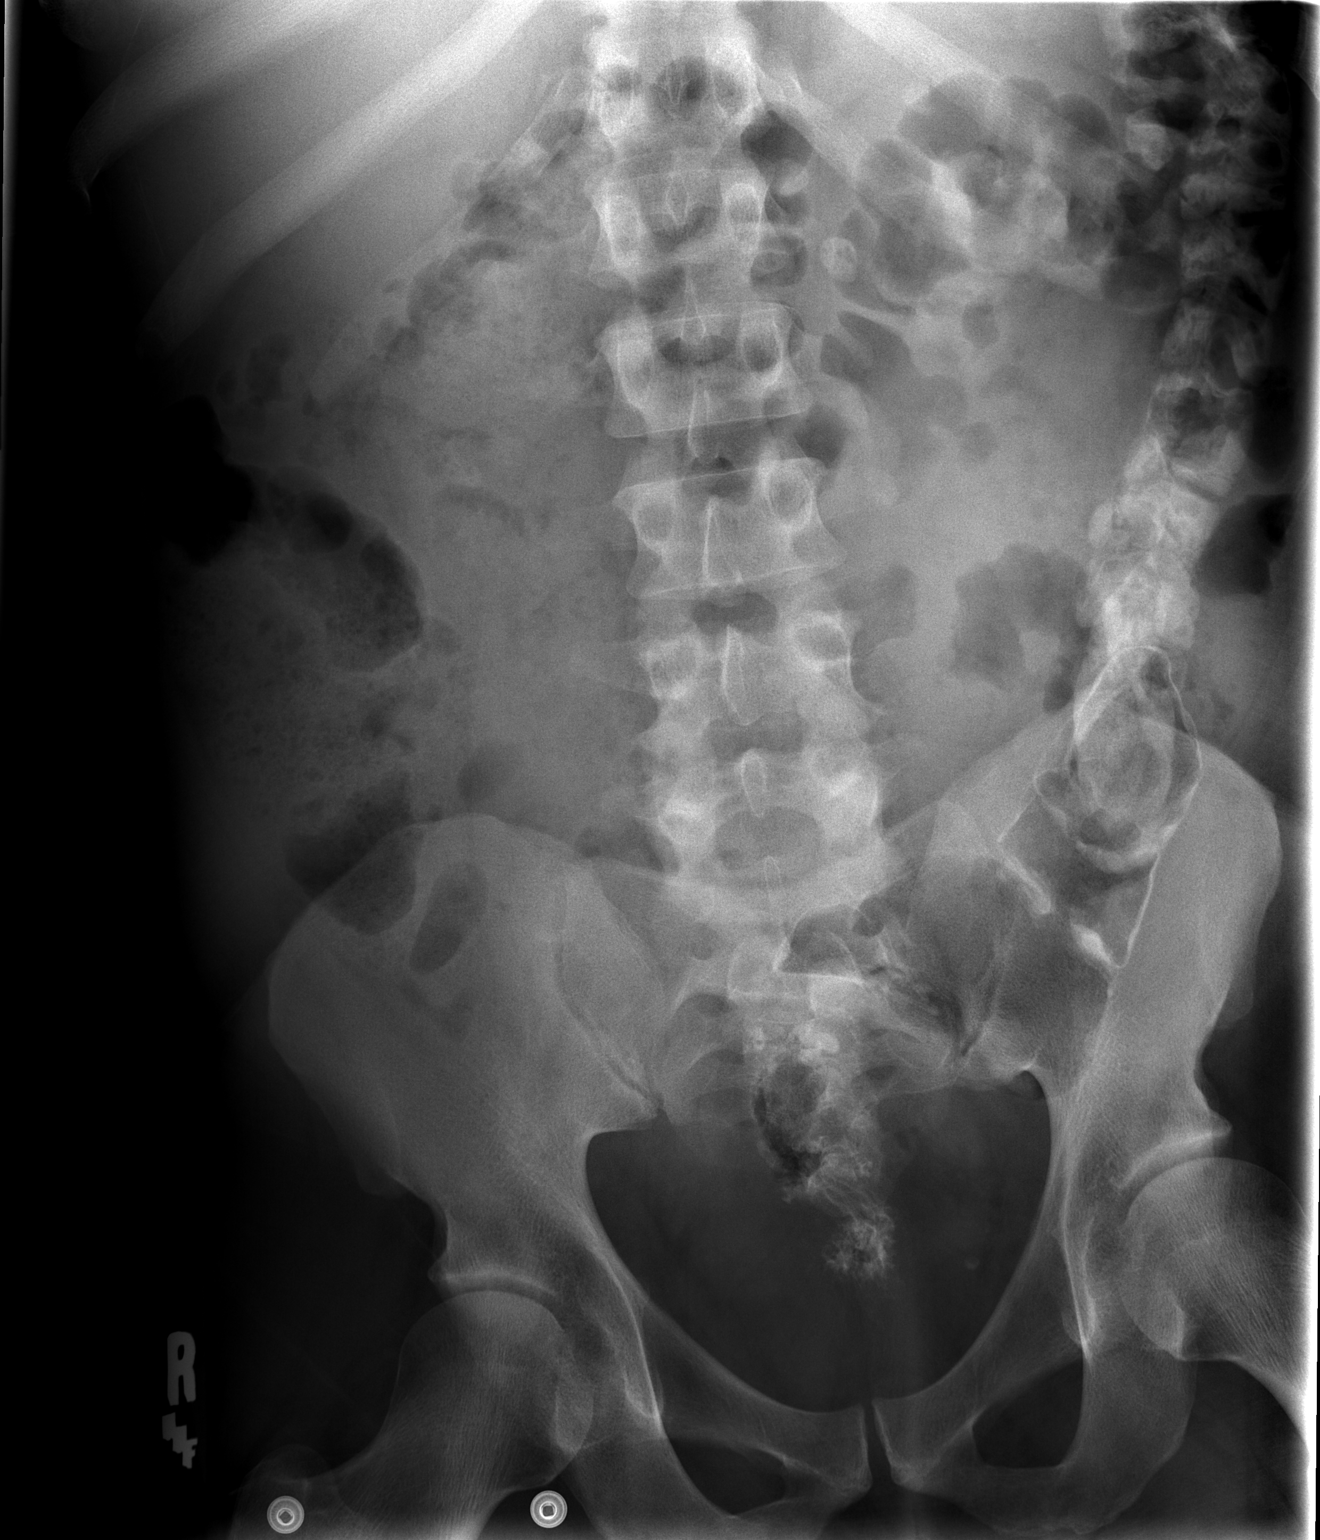

[2 of 2 positions shown; findings below may reference images not displayed]

IMPRESSION: No evidence of obstruction or perforation of the bowel.  The oral contrast from the previous CT scan has now passed into the left colon and rectum.

## 2004-06-27 IMAGING — CR DG ABDOMEN ACUTE W/ 1V CHEST
3 series · 3 of 3 positions shown · non-contrast
Comparison: none

CLINICAL DATA: Abdominal pain; colitis/diverticulitis
 ACUTE ABDOMINAL SERIES WITH CHEST - 3 VIEW:
 Cardiomegaly and mild pulmonary vascular congestion identified.  Mild peribronchial thickening is noted without focal air space disease.  Unremarkable bowel gas pattern noted.  Few colonic diverticula are present.  Degenerative changes in the right shoulder are noted.  There is no evidence of bowel obstruction or pneumoperitoneum.

[view not recorded (1 of 3)]
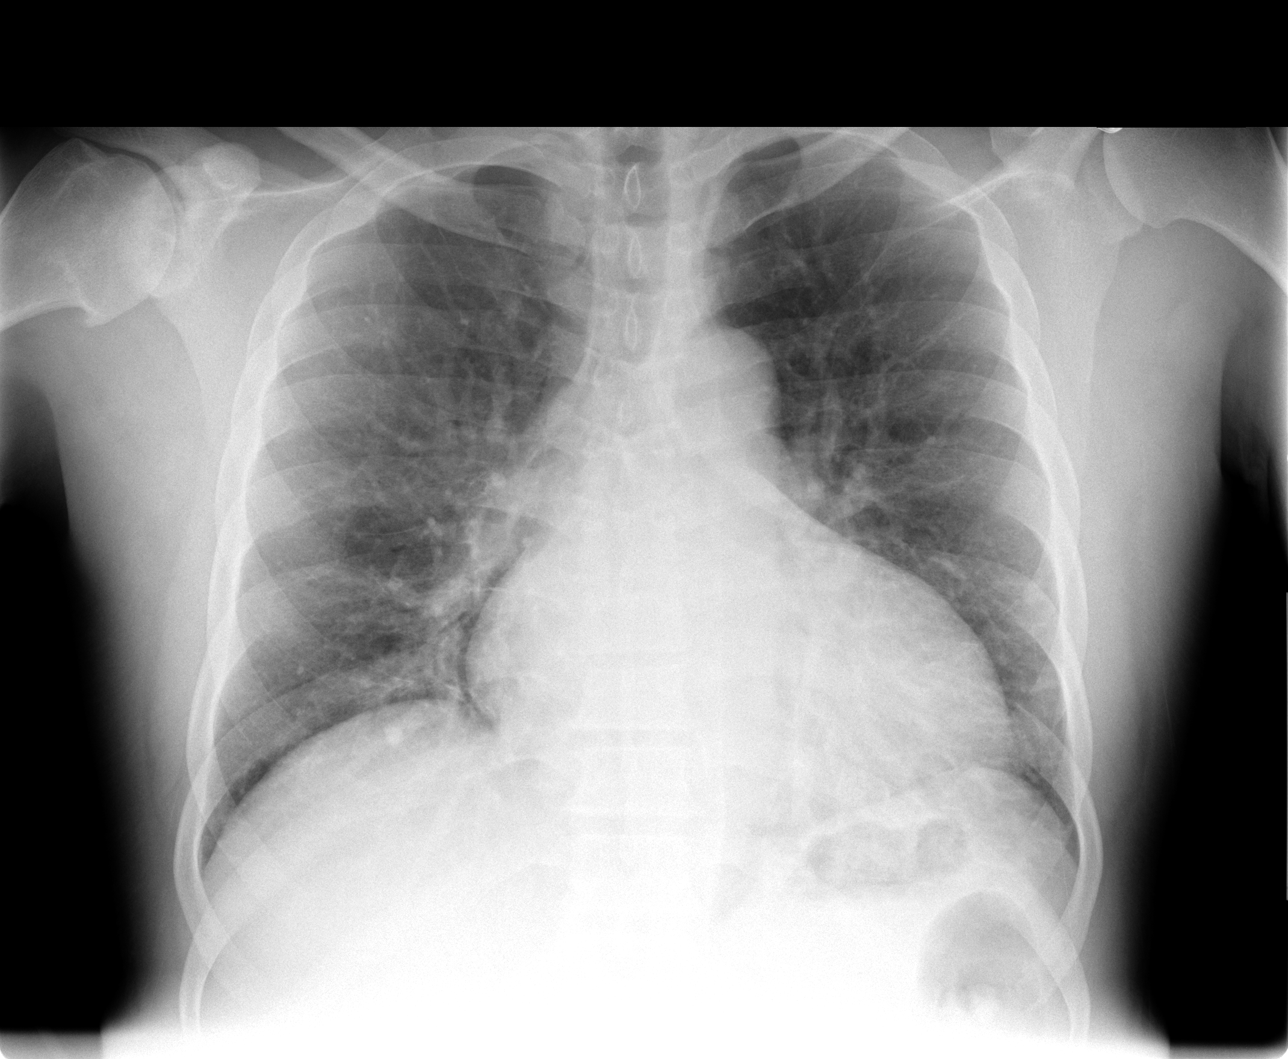

[view not recorded (2 of 3)]
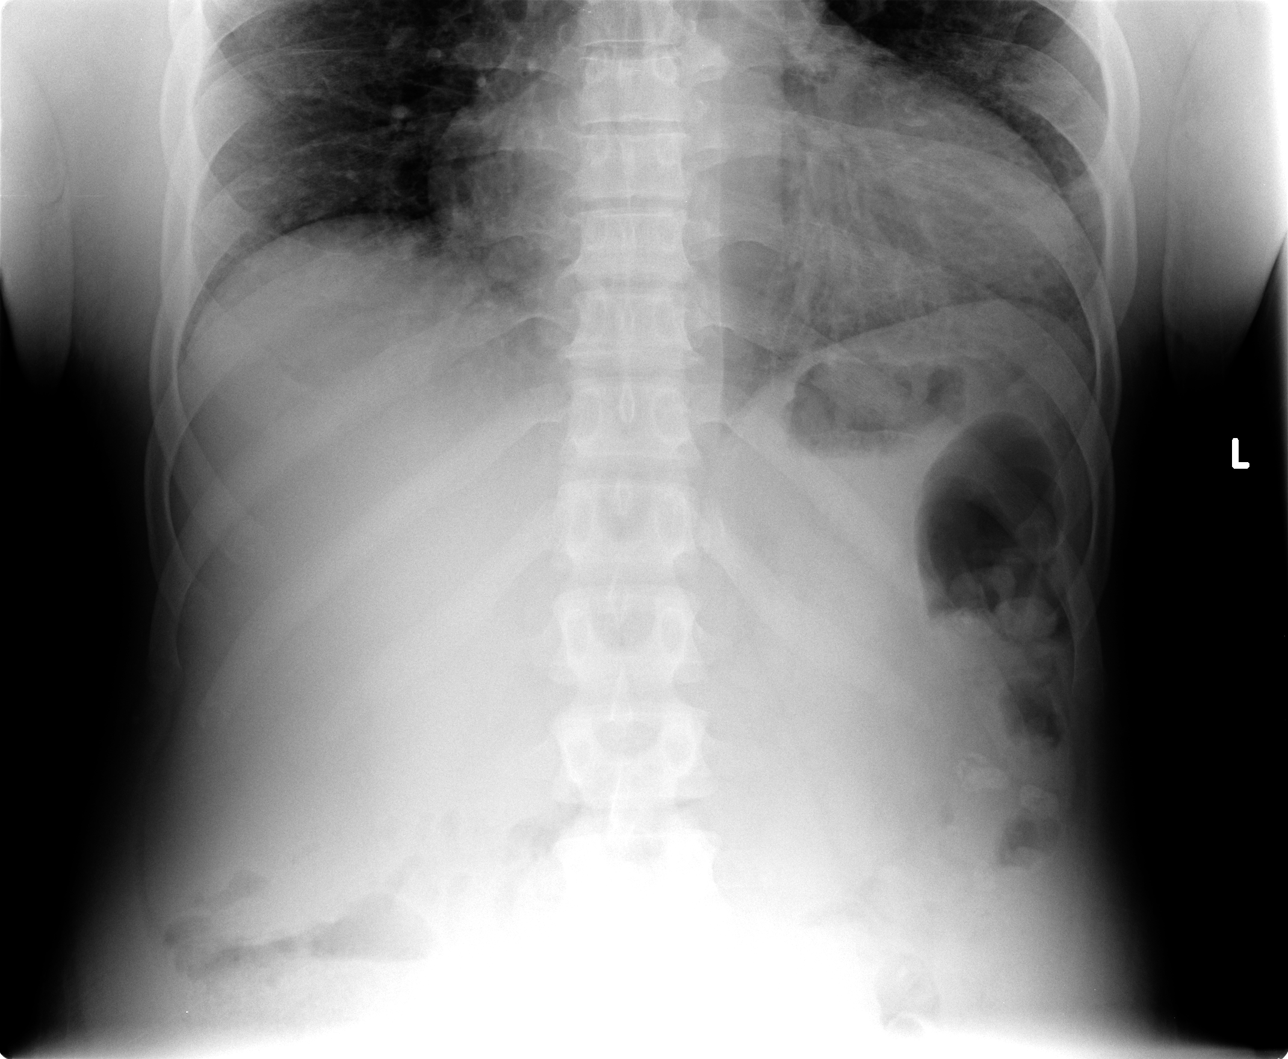

[view not recorded (3 of 3)]
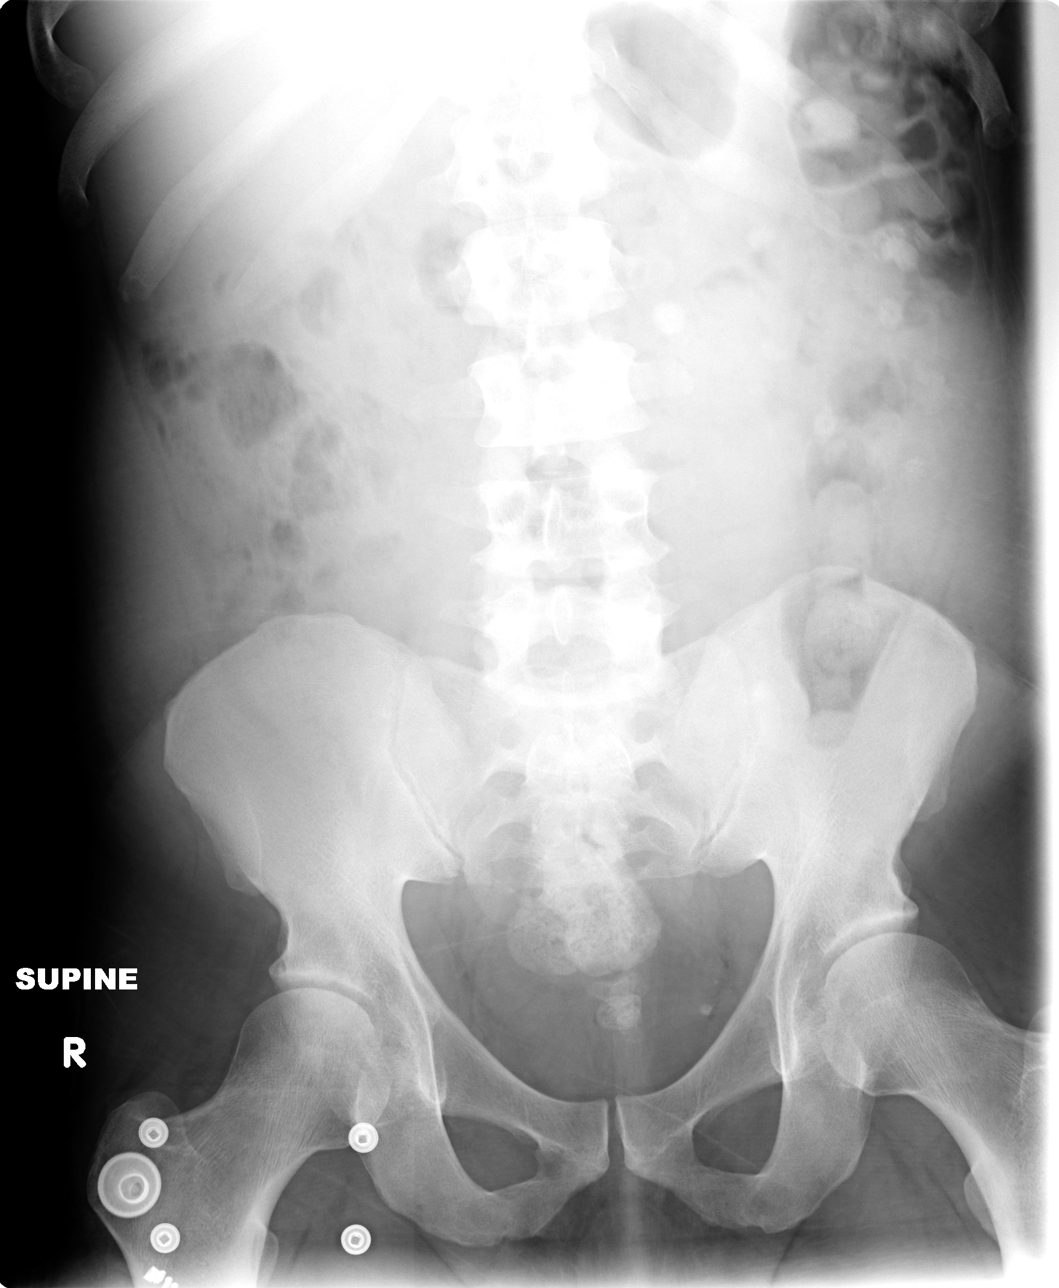

[3 of 3 positions shown; findings below may reference images not displayed]

IMPRESSION: 1.  Unremarkable bowel gas pattern.  
 2.  Cardiomegaly with mild pulmonary vascular congestion and mild peribronchial thickening.

## 2004-12-28 ENCOUNTER — Emergency Department (HOSPITAL_COMMUNITY): Admission: EM | Admit: 2004-12-28 | Discharge: 2004-12-28 | Payer: Self-pay | Admitting: Emergency Medicine

## 2004-12-28 IMAGING — CR DG LUMBAR SPINE COMPLETE 4+V
5 series · 5 of 5 positions shown · non-contrast
Comparison: none

CLINICAL DATA: Low back and left hip pain. No injury.

LUMBAR SPINE - 4 VIEW:

[view not recorded (1 of 5)]
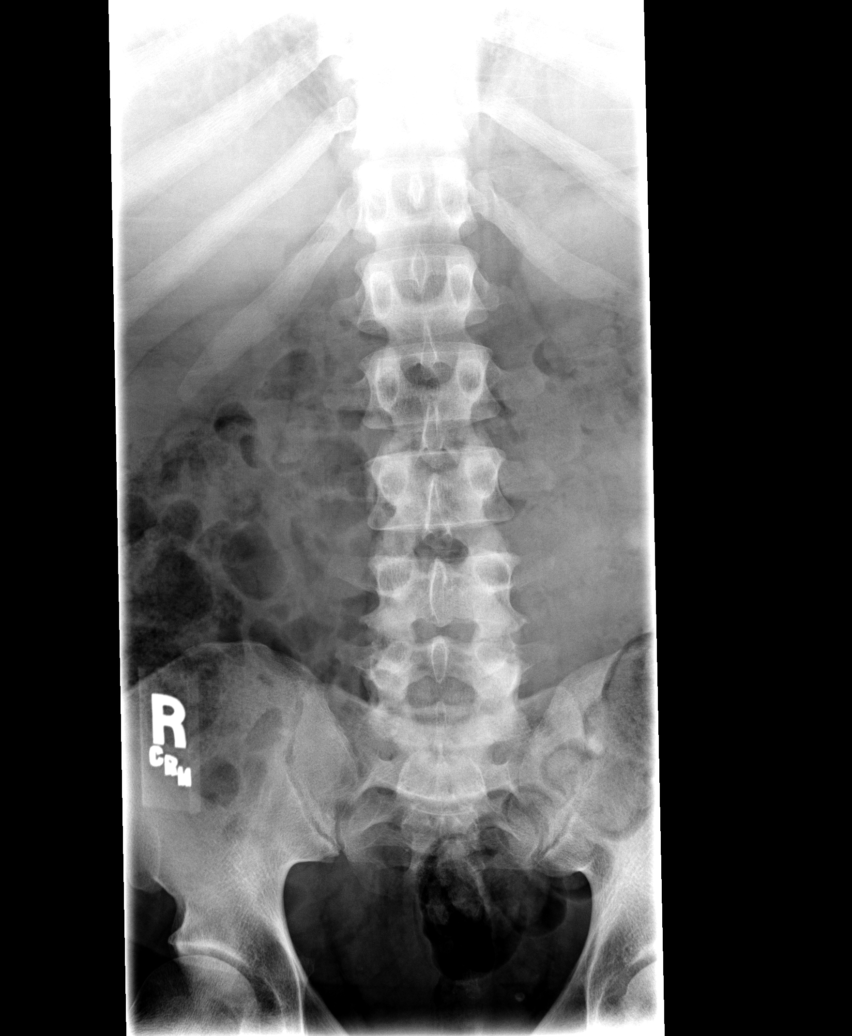

[view not recorded (2 of 5)]
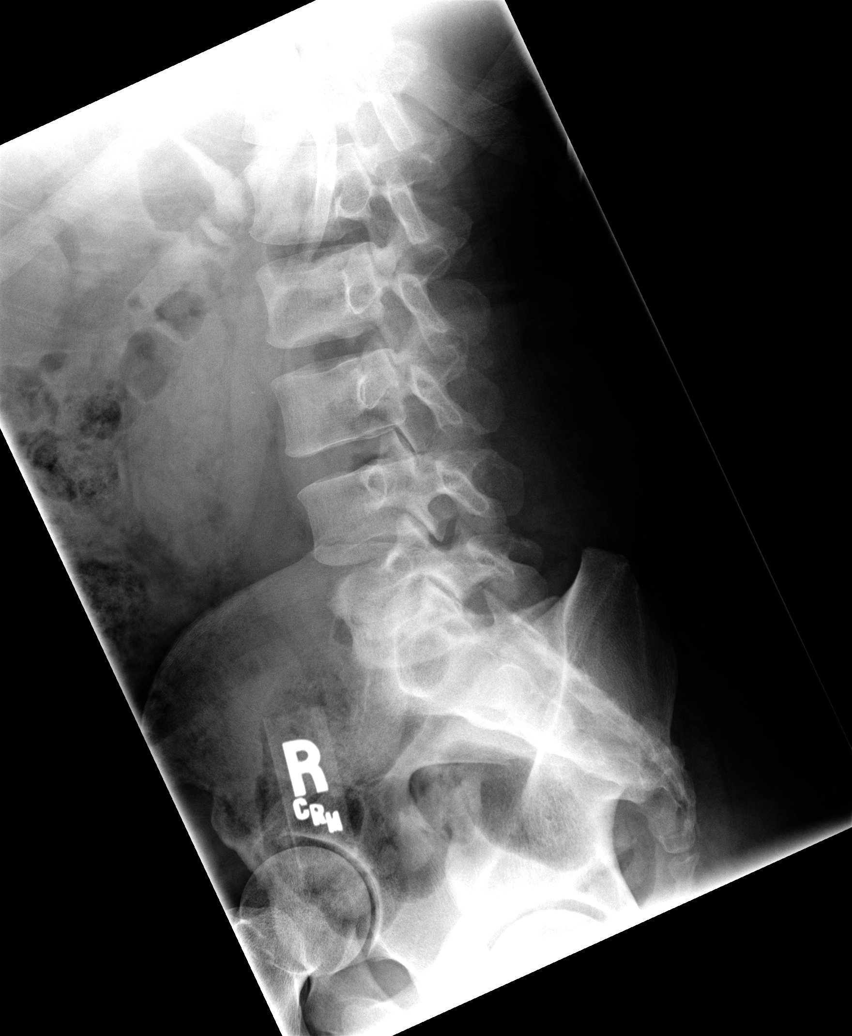

[view not recorded (3 of 5)]
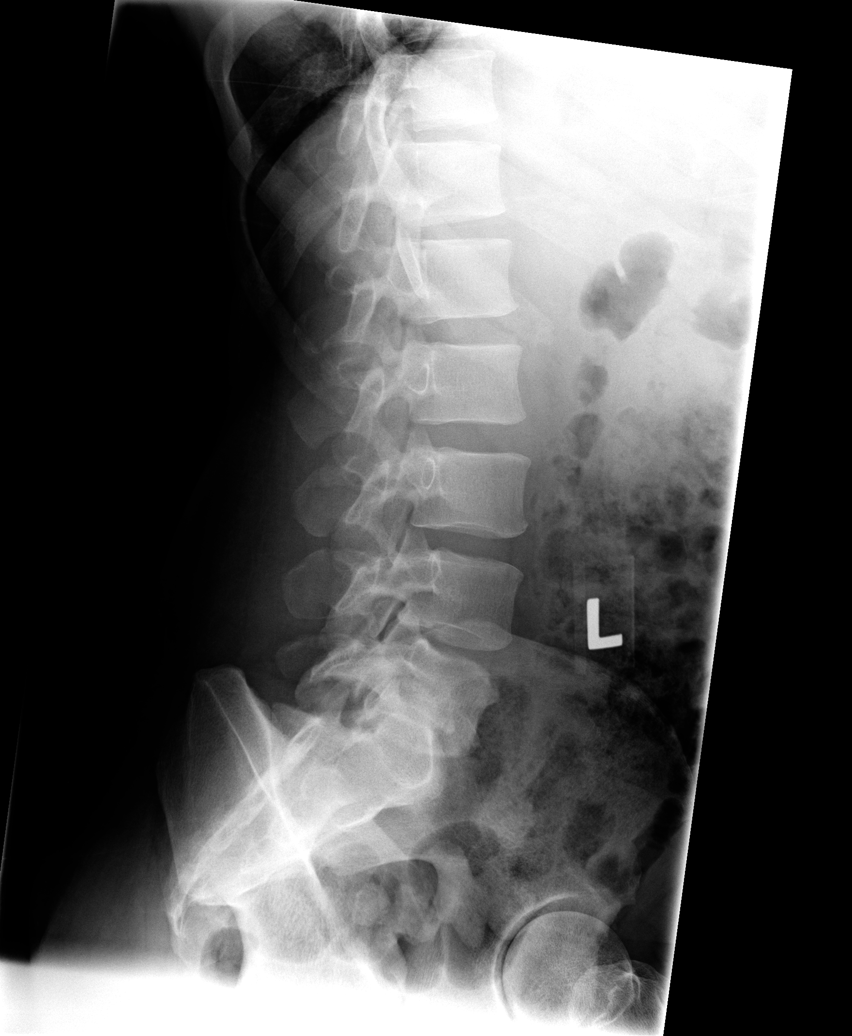

[view not recorded (4 of 5)]
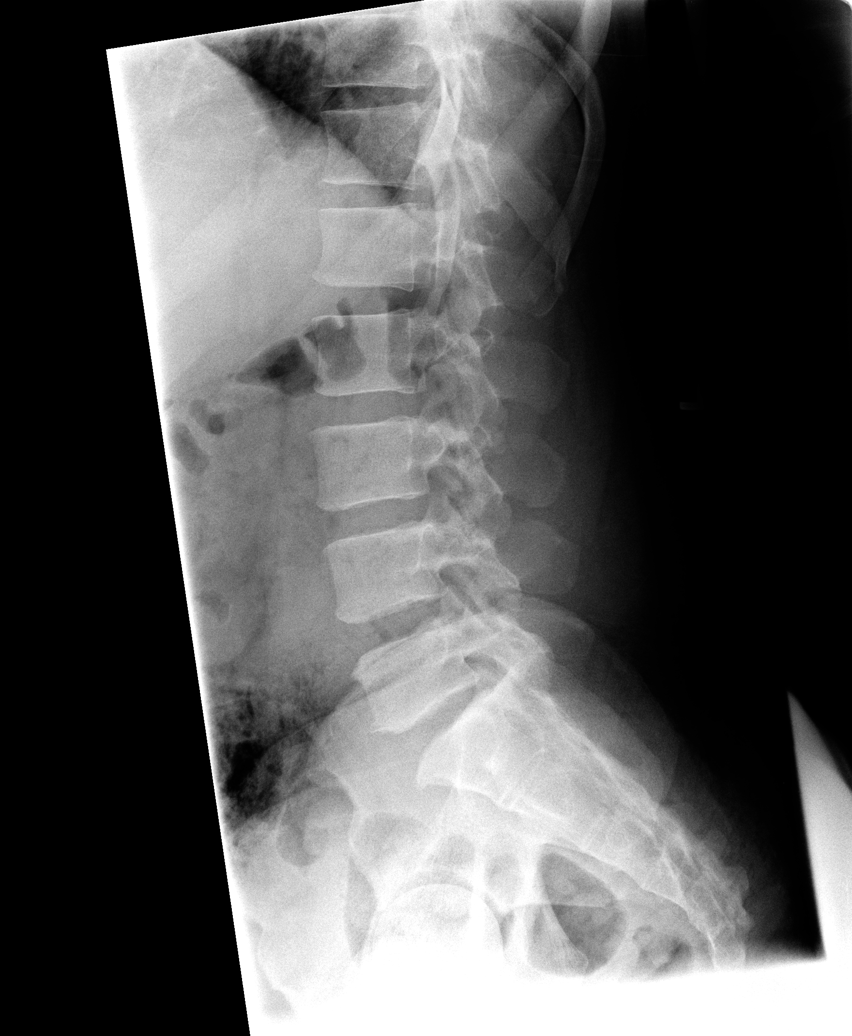

[view not recorded (5 of 5)]
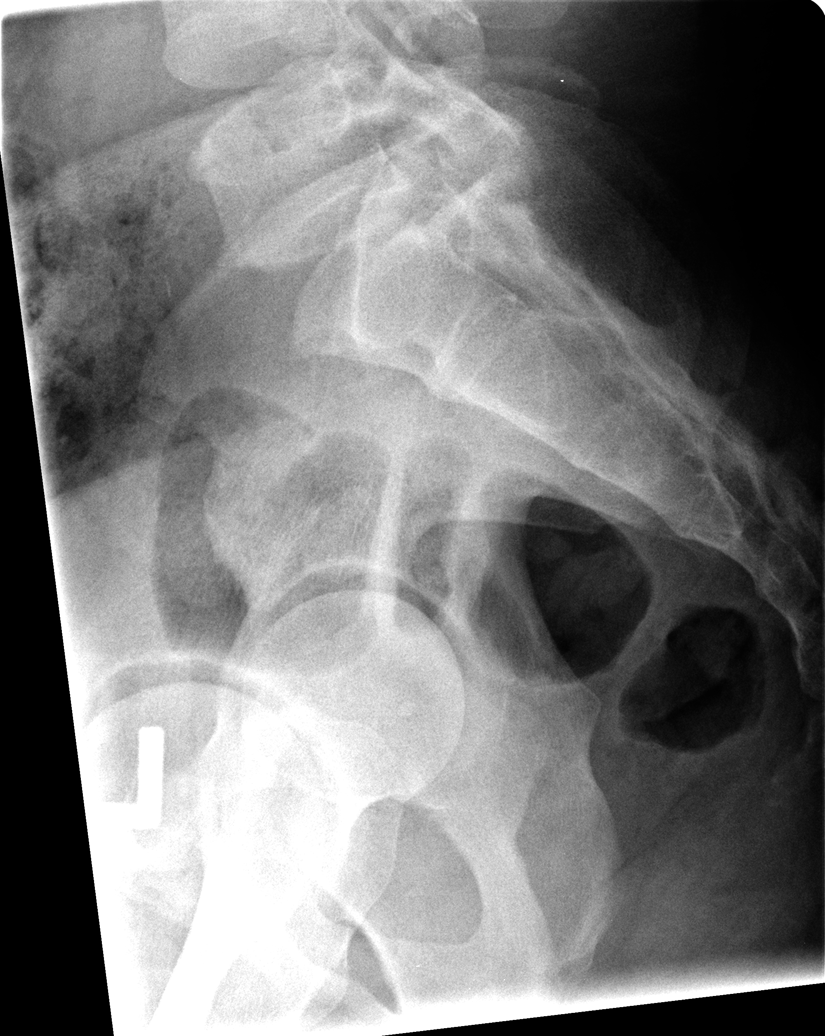

[5 of 5 positions shown; findings below may reference images not displayed]

FINDINGS: There is no evidence of lumbar spine fracture.  Alignment is normal. 
Intervertebral disc spaces are maintained, and no other significant bone
abnormalities are identified.
IMPRESSION: Negative lumbar spine radiographs.

LEFT HIP - 2  VIEW:
FINDINGS: There is no evidence of hip fracture or dislocation.  There is no
evidence of arthropathy or other focal bone abnormality.
IMPRESSION: Negative.
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
## 2010-10-30 ENCOUNTER — Emergency Department (HOSPITAL_COMMUNITY)
Admission: EM | Admit: 2010-10-30 | Discharge: 2010-10-30 | Disposition: A | Discharge status: Home / Self Care | Payer: Self-pay | Attending: Emergency Medicine | Admitting: Emergency Medicine

## 2010-10-30 DIAGNOSIS — M25569 Pain in unspecified knee: Secondary | ICD-10-CM | POA: Insufficient documentation

## 2010-10-30 DIAGNOSIS — M7120 Synovial cyst of popliteal space [Baker], unspecified knee: Secondary | ICD-10-CM | POA: Insufficient documentation

## 2010-10-30 DIAGNOSIS — M79609 Pain in unspecified limb: Secondary | ICD-10-CM

## 2010-10-30 DIAGNOSIS — F17200 Nicotine dependence, unspecified, uncomplicated: Secondary | ICD-10-CM | POA: Insufficient documentation

## 2014-12-10 ENCOUNTER — Telehealth: Payer: Self-pay

## 2014-12-10 NOTE — Telephone Encounter (Signed)
error 

## 2015-01-07 ENCOUNTER — Emergency Department (HOSPITAL_COMMUNITY): Payer: Self-pay

## 2015-01-07 ENCOUNTER — Encounter (HOSPITAL_COMMUNITY): Payer: Self-pay | Admitting: Neurology

## 2015-01-07 ENCOUNTER — Inpatient Hospital Stay (HOSPITAL_COMMUNITY)
Admission: EM | Admit: 2015-01-07 | Discharge: 2015-01-09 | DRG: 395 | Disposition: A | Discharge status: Home / Self Care | Payer: Self-pay | Attending: General Surgery | Admitting: General Surgery

## 2015-01-07 DIAGNOSIS — K432 Incisional hernia without obstruction or gangrene: Secondary | ICD-10-CM | POA: Diagnosis present

## 2015-01-07 DIAGNOSIS — K430 Incisional hernia with obstruction, without gangrene: Principal | ICD-10-CM | POA: Diagnosis present

## 2015-01-07 DIAGNOSIS — K56609 Unspecified intestinal obstruction, unspecified as to partial versus complete obstruction: Secondary | ICD-10-CM

## 2015-01-07 DIAGNOSIS — M1990 Unspecified osteoarthritis, unspecified site: Secondary | ICD-10-CM | POA: Diagnosis present

## 2015-01-07 DIAGNOSIS — I10 Essential (primary) hypertension: Secondary | ICD-10-CM | POA: Diagnosis present

## 2015-01-07 DIAGNOSIS — F17200 Nicotine dependence, unspecified, uncomplicated: Secondary | ICD-10-CM | POA: Diagnosis present

## 2015-01-07 DIAGNOSIS — Z9049 Acquired absence of other specified parts of digestive tract: Secondary | ICD-10-CM

## 2015-01-07 DIAGNOSIS — K460 Unspecified abdominal hernia with obstruction, without gangrene: Secondary | ICD-10-CM

## 2015-01-07 HISTORY — DX: Unspecified osteoarthritis, unspecified site: M19.90

## 2015-01-07 HISTORY — DX: Essential (primary) hypertension: I10

## 2015-01-07 LAB — CBC
HCT: 38.2 % — ABNORMAL LOW (ref 39.0–52.0)
HEMOGLOBIN: 13.1 g/dL (ref 13.0–17.0)
MCH: 40.4 pg — ABNORMAL HIGH (ref 26.0–34.0)
MCHC: 34.3 g/dL (ref 30.0–36.0)
MCV: 117.9 fL — ABNORMAL HIGH (ref 78.0–100.0)
PLATELETS: 296 10*3/uL (ref 150–400)
RBC: 3.24 MIL/uL — AB (ref 4.22–5.81)
RDW: 15.3 % (ref 11.5–15.5)
WBC: 9.7 10*3/uL (ref 4.0–10.5)

## 2015-01-07 LAB — COMPREHENSIVE METABOLIC PANEL
ALT: 22 U/L (ref 17–63)
AST: 20 U/L (ref 15–41)
Albumin: 3.9 g/dL (ref 3.5–5.0)
Alkaline Phosphatase: 75 U/L (ref 38–126)
Anion gap: 7 (ref 5–15)
BUN: 14 mg/dL (ref 6–20)
CHLORIDE: 110 mmol/L (ref 101–111)
CO2: 23 mmol/L (ref 22–32)
Calcium: 9 mg/dL (ref 8.9–10.3)
Creatinine, Ser: 0.88 mg/dL (ref 0.61–1.24)
GFR calc Af Amer: >60 mL/min (ref >60)
GFR calc non Af Amer: >60 mL/min (ref >60)
Glucose, Bld: 119 mg/dL — ABNORMAL HIGH (ref 65–99)
Potassium: 3.6 mmol/L (ref 3.5–5.1)
Sodium: 140 mmol/L (ref 135–145)
Total Bilirubin: 0.6 mg/dL (ref 0.3–1.2)
Total Protein: 7.2 g/dL (ref 6.5–8.1)

## 2015-01-07 LAB — URINE MICROSCOPIC-ADD ON

## 2015-01-07 LAB — URINALYSIS, ROUTINE W REFLEX MICROSCOPIC
Glucose, UA: NEGATIVE mg/dL
Hgb urine dipstick: NEGATIVE
Ketones, ur: 15 mg/dL — AB
NITRITE: NEGATIVE
Protein, ur: NEGATIVE mg/dL
Specific Gravity, Urine: 1.034 — ABNORMAL HIGH (ref 1.005–1.030)
pH: 5.5 (ref 5.0–8.0)

## 2015-01-07 LAB — LIPASE, BLOOD: Lipase: 22 U/L (ref 11–51)

## 2015-01-07 IMAGING — CT CT ABD-PELV W/ CM
2 of 5 series · 16 of 46 positions shown, 18 images · IV contrast (APPLIED)
Comparison: None available

CLINICAL DATA: Hernia, mid abdominal pain with nausea and vomiting

EXAM:
CT ABDOMEN AND PELVIS WITH CONTRAST
TECHNIQUE: Multidetector CT imaging of the abdomen and pelvis was performed
using the standard protocol following bolus administration of
intravenous contrast.
CONTRAST:  80mL OMNIPAQUE IOHEXOL 300 MG/ML  SOLN

[Series 2: abd/ pelvis 5.0 i30f 1 · axial · 0.84mm/px · z∈[+913,+1353]mm · 13 of 100 slices shown, 15 images]
[im 6/100  soft-tissue]
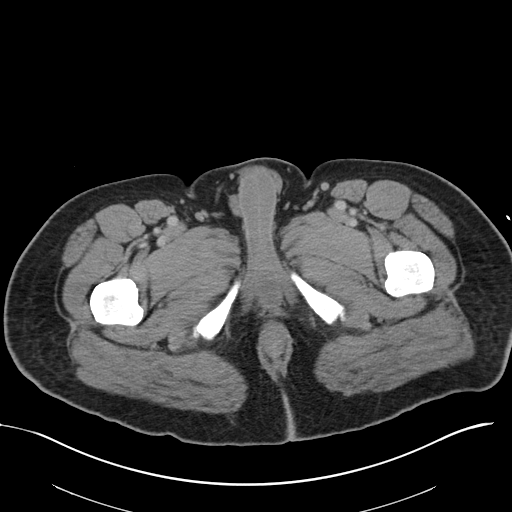
[im 6/100  bone]
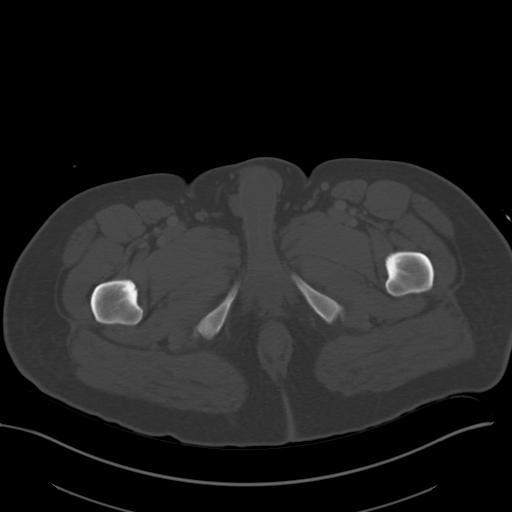
[im 12/100  soft-tissue]
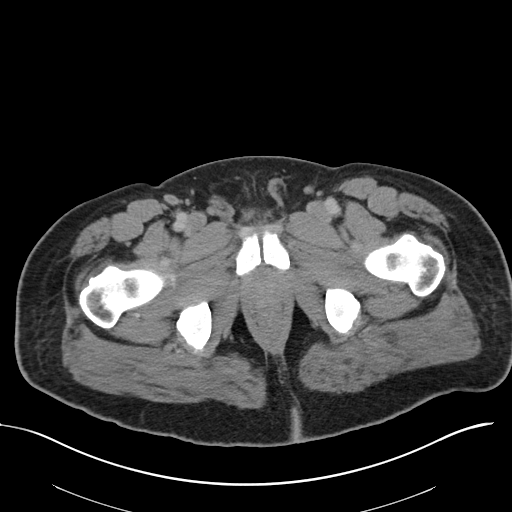
[im 23/100  soft-tissue]
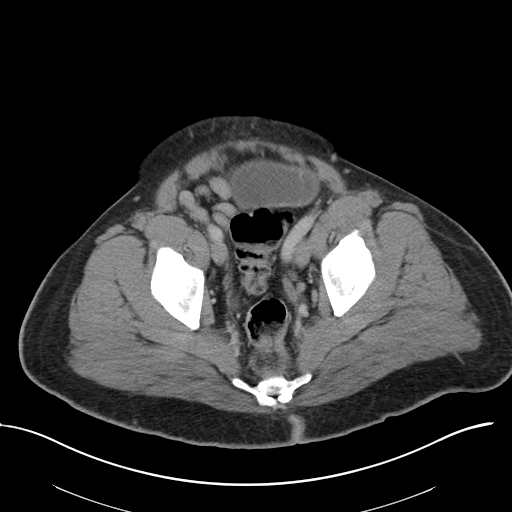
[im 28/100  soft-tissue]
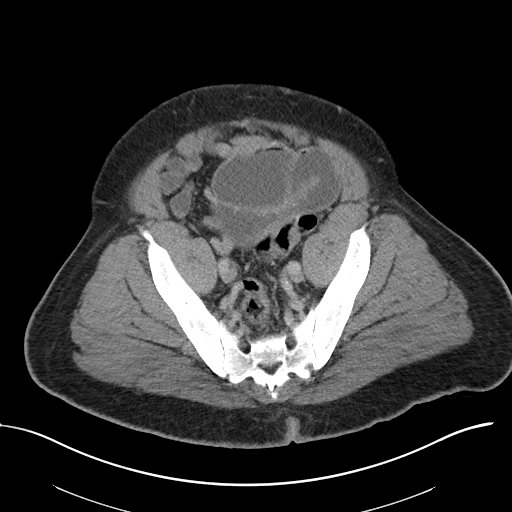
[im 34/100  soft-tissue]
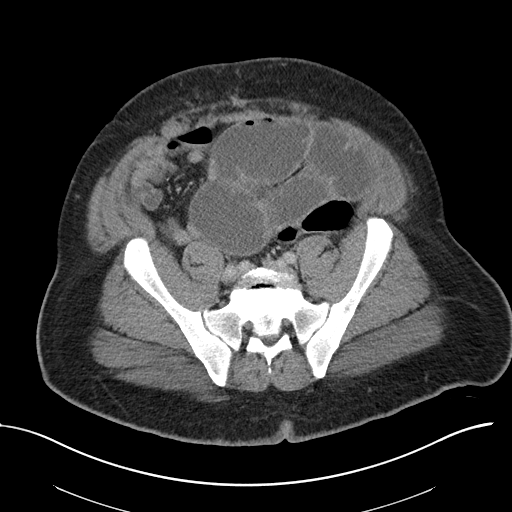
[im 45/100  soft-tissue]
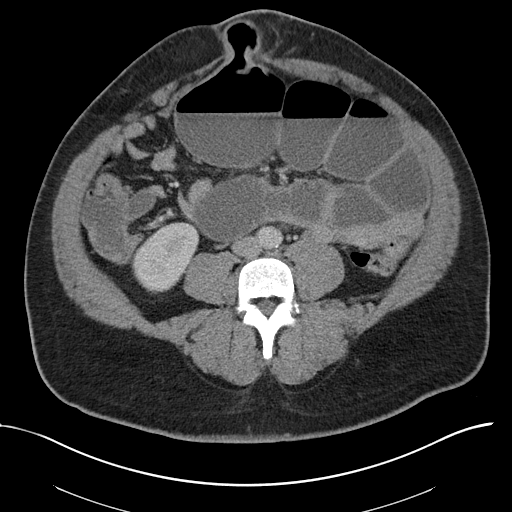
[im 50/100  soft-tissue]
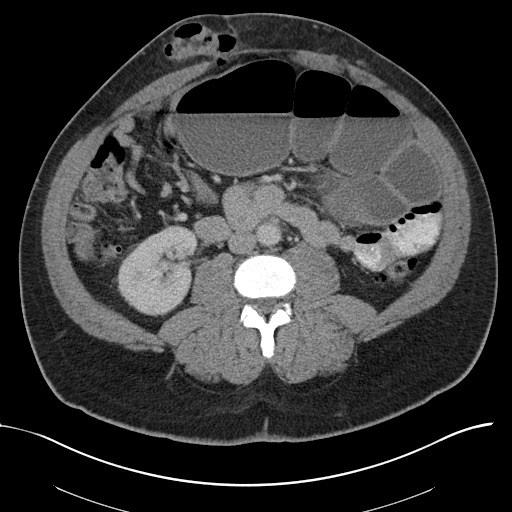
[im 56/100  soft-tissue]
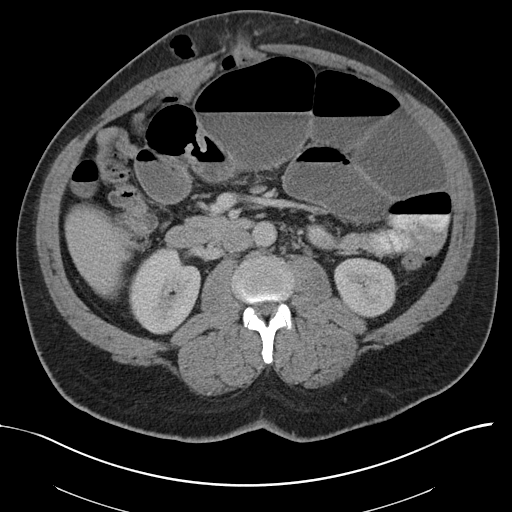
[im 67/100  soft-tissue]
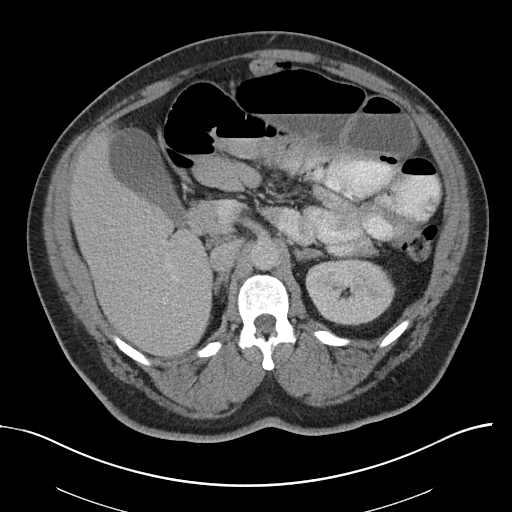
[im 67/100  bone]
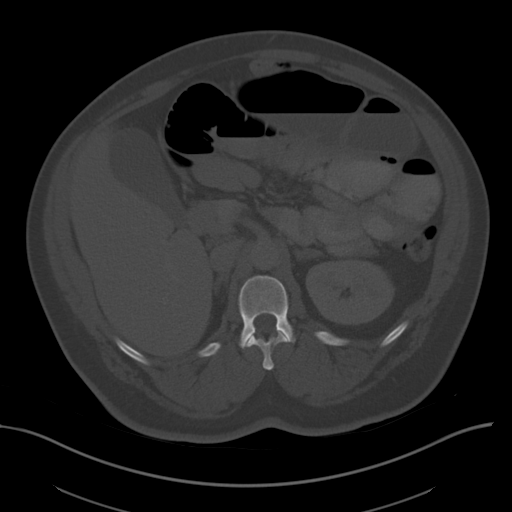
[im 72/100  soft-tissue]
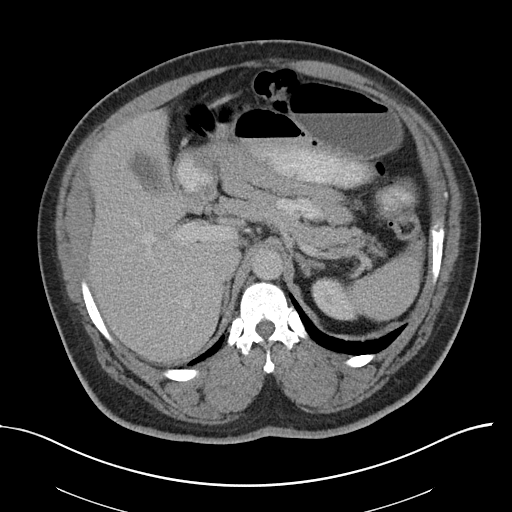
[im 78/100  soft-tissue]
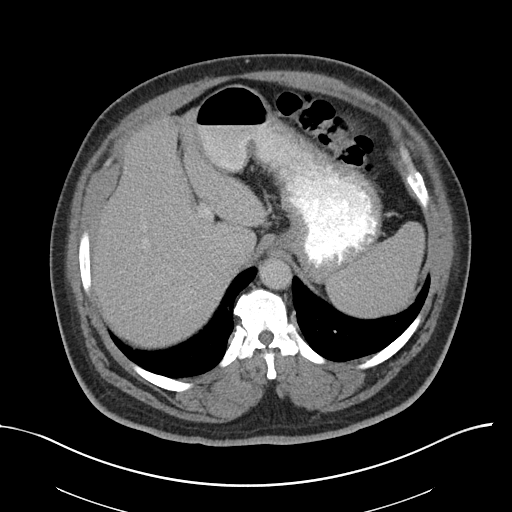
[im 89/100  soft-tissue]
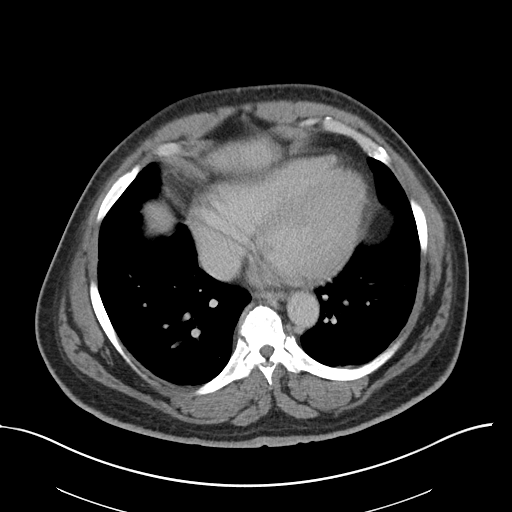
[im 94/100  soft-tissue]
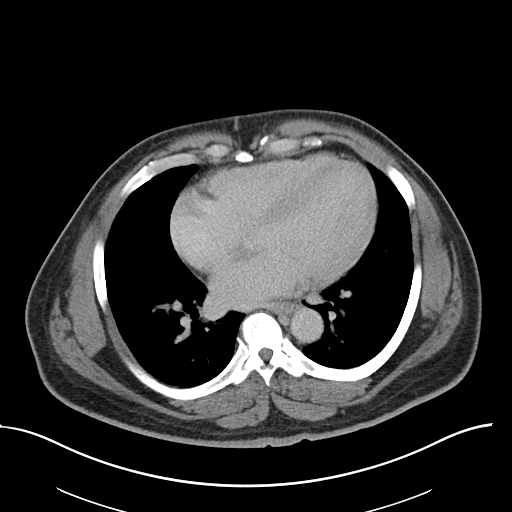

[Series 5: coronal soft tissue · coronal · 0.88mm/px · 3 of 118 slices shown]
[im 40/118  soft-tissue]
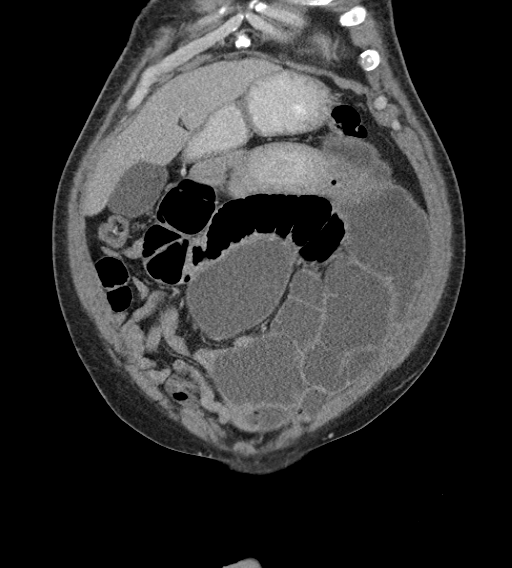
[im 53/118  soft-tissue]
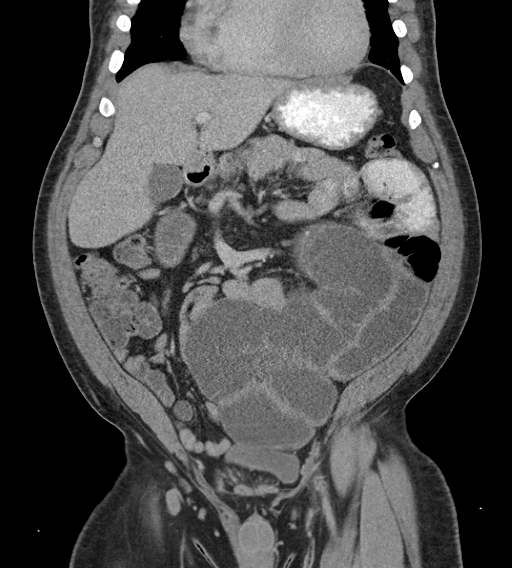
[im 66/118  soft-tissue]
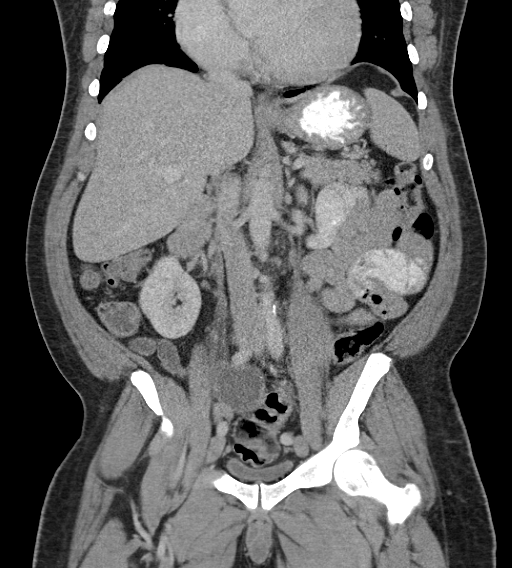

[16 of 46 positions shown; findings below may reference images not displayed]

FINDINGS: Lower chest: Minor dependent basilar atelectasis. Cardiomegaly
evident. No pericardial or pleural effusion. Small hiatal hernia
evident.

Hepatobiliary: No focal hepatic abnormality or biliary dilatation.
Patent hepatic and portal veins. Gallbladder and biliary system
unremarkable.

Pancreas: No mass, inflammatory changes, or other significant
abnormality.

Spleen: Within normal limits in size and appearance.

Adrenals/Urinary Tract: No masses identified. No evidence of
hydronephrosis.

Stomach/Bowel: There are several markedly dilated loops of jejunum
throughout the abdomen. Dilated jejunal loops lead directly into a
umbilical hernia and the exiting loop is completely collapsed.
Appearance is compatible with an incarcerated umbilical hernia and
associated small bowel obstruction. No pneumatosis, significant wall
thickening, or free air. No fluid collection or abscess. There are 2
additional small anterior abdominal ventral hernias containing
portions of colon without obstruction, just above the incarcerated
umbilical hernia. These are best demonstrated on the sagittal
reconstructions.

Vascular/Lymphatic: Minor aortic atherosclerosis. Negative for
aneurysm. No adenopathy.

Reproductive: No mass or other significant abnormality.

Other: No inguinal abnormality.

Musculoskeletal: No acute or significant osseous finding.
Degenerative disc disease at L5-S1.
IMPRESSION: Incarcerated umbilical hernia with small bowel obstruction.

Two additional small anterior abdominal ventral hernias containing
nondilated loops of colon.

Negative for abscess, perforation or free air.

These results were called by telephone at the time of interpretation
on [DATE] at [DATE] to Dr. JANISE, who verbally
acknowledged these results.

## 2015-01-07 MED ORDER — MORPHINE SULFATE (PF) 4 MG/ML IV SOLN
4.0000 mg | Freq: Once | INTRAVENOUS | Status: AC
  Administered 2015-01-07: 4 mg via INTRAVENOUS
  Filled 2015-01-07: qty 1

## 2015-01-07 MED ORDER — SODIUM CHLORIDE 0.9 % IV SOLN
INTRAVENOUS | Status: DC
  Administered 2015-01-07 – 2015-01-08 (×3): via INTRAVENOUS

## 2015-01-07 MED ORDER — PANTOPRAZOLE SODIUM 40 MG IV SOLR
40.0000 mg | Freq: Every day | INTRAVENOUS | Status: DC
  Administered 2015-01-07: 40 mg via INTRAVENOUS
  Filled 2015-01-07: qty 40

## 2015-01-07 MED ORDER — BARIUM SULFATE 2.1 % PO SUSP: 900.0000 mL | Freq: Once | ORAL | Status: DC

## 2015-01-07 MED ORDER — ACETAMINOPHEN 650 MG RE SUPP
650.0000 mg | Freq: Four times a day (QID) | RECTAL | Status: DC | PRN
Start: 2015-01-07 — End: 2015-01-09

## 2015-01-07 MED ORDER — ENOXAPARIN SODIUM 40 MG/0.4ML ~~LOC~~ SOLN
40.0000 mg | SUBCUTANEOUS | Status: DC
  Administered 2015-01-08 – 2015-01-09 (×2): 40 mg via SUBCUTANEOUS
  Filled 2015-01-07 (×2): qty 0.4

## 2015-01-07 MED ORDER — LIDOCAINE VISCOUS 2 % MT SOLN
15.0000 mL | Freq: Once | OROMUCOSAL | Status: AC
  Administered 2015-01-07: 15 mL via OROMUCOSAL
  Filled 2015-01-07: qty 15

## 2015-01-07 MED ORDER — MORPHINE SULFATE (PF) 2 MG/ML IV SOLN
2.0000 mg | INTRAVENOUS | Status: DC | PRN
  Administered 2015-01-08 (×2): 2 mg via INTRAVENOUS
  Filled 2015-01-07 (×2): qty 1

## 2015-01-07 MED ORDER — LORAZEPAM 2 MG/ML IJ SOLN
1.0000 mg | Freq: Once | INTRAMUSCULAR | Status: AC
  Administered 2015-01-07: 1 mg via INTRAVENOUS
  Filled 2015-01-07: qty 1

## 2015-01-07 MED ORDER — ACETAMINOPHEN 325 MG PO TABS: 650.0000 mg | ORAL_TABLET | Freq: Four times a day (QID) | ORAL | Status: DC | PRN

## 2015-01-07 MED ORDER — ONDANSETRON 4 MG PO TBDP: 4.0000 mg | ORAL_TABLET | Freq: Four times a day (QID) | ORAL | Status: DC | PRN

## 2015-01-07 MED ORDER — ONDANSETRON HCL 4 MG/2ML IJ SOLN
4.0000 mg | Freq: Once | INTRAMUSCULAR | Status: AC
  Administered 2015-01-07: 4 mg via INTRAVENOUS
  Filled 2015-01-07: qty 2

## 2015-01-07 MED ORDER — ONDANSETRON HCL 4 MG/2ML IJ SOLN
4.0000 mg | Freq: Four times a day (QID) | INTRAMUSCULAR | Status: DC | PRN
Start: 2015-01-07 — End: 2015-01-09

## 2015-01-07 MED ORDER — IOHEXOL 300 MG/ML  SOLN
80.0000 mL | Freq: Once | INTRAMUSCULAR | Status: AC | PRN
  Administered 2015-01-07: 80 mL via INTRAVENOUS

## 2015-01-07 MED ORDER — BARIUM SULFATE 2.1 % PO SUSP
ORAL | Status: AC
  Filled 2015-01-07: qty 1

## 2015-01-07 MED ORDER — BARIUM SULFATE 2.1 % PO SUSP: 450.0000 mL | Freq: Once | ORAL | Status: DC

## 2015-01-07 NOTE — ED Notes (Signed)
Dr. Dwain SarnaWakefield at bedside. Aware that pt did not tolerate NG tube and pt is refusing further attempts.

## 2015-01-07 NOTE — ED Provider Notes (Signed)
CSN: [id number]     Arrival date & time 01/07/15  1235 History   First MD Initiated Contact with Patient 01/07/15 1446     Chief Complaint  Patient presents with  . Hernia  . Abdominal Pain     (Consider location/radiation/quality/duration/timing/severity/associated sxs/prior Treatment) HPI Comments: Patient presents to the emergency department with chief complaint of abdominal pain. He states that he has a known umbilical hernia which she has had for several years. He states that he cannot afford surgery. 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 He states that the pain has been ongoing for the past month, but recently worsened last night. He reports associated nausea and vomiting that started this morning. Denies any diarrhea or constipation. He has not tried taking anything to alleviate his symptoms.  The history is provided by the patient. No language interpreter was used.    Past Medical History  Diagnosis Date  . Arthritis   . Hypertension    History reviewed. No pertinent past surgical history. No family history on file. Social History  Substance Use Topics  . Smoking status: Current Every Day Smoker  . Smokeless tobacco: None  . Alcohol Use: Yes    Review of Systems  Constitutional: Negative for fever and chills.  Respiratory: Negative for shortness of breath.   Cardiovascular: Negative for chest pain.  Gastrointestinal: Positive for nausea, vomiting and abdominal pain. Negative for diarrhea and constipation.  Genitourinary: Negative for dysuria.  All other systems reviewed and are negative.     Allergies  Review of patient's allergies indicates no known allergies.  Home Medications   Prior to Admission medications   Medication Sig Start Date End Date Taking? Authorizing Provider  Chlorphen-Pseudoephed-APAP (THERAFLU FLU/COLD PO) Take 1 packet by mouth 2 (two) times daily.   Yes  Historical Provider, MD   BP 159/104 mmHg  Pulse 80  Temp(Src) 98.1 F (36.7 C) (Oral)  Resp 18  SpO2 97% Physical Exam  Constitutional: He is oriented to person, place, and time. He appears well-developed and well-nourished.  HENT:  Head: Normocephalic and atraumatic.  Eyes: Conjunctivae and EOM are normal. Pupils are equal, round, and reactive to light. Right eye exhibits no discharge. Left eye exhibits no discharge. No scleral icterus.  Neck: Normal range of motion. Neck supple. No JVD present.  Cardiovascular: Normal rate, regular rhythm and normal heart sounds.  Exam reveals no gallop and no friction rub.   No murmur heard. Pulmonary/Chest: Effort normal and breath sounds normal. No respiratory distress. He has no wheezes. He has no rales. He exhibits no tenderness.  Abdominal: Soft. He exhibits no distension and no mass. There is tenderness. There is no rebound and no guarding.  Large umbilical hernia, no erythema, no induration, easily reduced, but quickly recurs  Musculoskeletal: Normal range of motion. He exhibits no edema or tenderness.  Neurological: He is alert and oriented to person, place, and time.  Skin: Skin is warm and dry.  Psychiatric: He has a normal mood and affect. His behavior is normal. Judgment and thought content normal.  Nursing note and vitals reviewed.   ED Course  Procedures (including critical care time) Results for orders placed or performed during the hospital encounter of 01/07/15  Lipase, blood  Result Value Ref Range   Lipase 22 11 - 51 U/L  Comprehensive metabolic panel  Result Value Ref Range   Sodium 140 135 - 145 mmol/L   Potassium 3.6 3.5 -  5.1 mmol/L   Chloride 110 101 - 111 mmol/L   CO2 23 22 - 32 mmol/L   Glucose, Bld 119 (H) 65 - 99 mg/dL   BUN 14 6 - 20 mg/dL   Creatinine, Ser 1.610.88 0.61 - 1.24 mg/dL   Calcium 9.0 8.9 - 09.610.3 mg/dL   Total Protein 7.2 6.5 - 8.1 g/dL   Albumin 3.9 3.5 - 5.0 g/dL   AST 20 15 - 41 U/L   ALT 22 17  - 63 U/L   Alkaline Phosphatase 75 38 - 126 U/L   Total Bilirubin 0.6 0.3 - 1.2 mg/dL   GFR calc non Af Amer >60 >60 mL/min   GFR calc Af Amer >60 >60 mL/min   Anion gap 7 5 - 15  CBC  Result Value Ref Range   WBC 9.7 4.0 - 10.5 K/uL   RBC 3.24 (L) 4.22 - 5.81 MIL/uL   Hemoglobin 13.1 13.0 - 17.0 g/dL   HCT 04.538.2 (L) 40.939.0 - 81.152.0 %   MCV 117.9 (H) 78.0 - 100.0 fL   MCH 40.4 (H) 26.0 - 34.0 pg   MCHC 34.3 30.0 - 36.0 g/dL   RDW 91.415.3 78.211.5 - 95.615.5 %   Platelets 296 150 - 400 K/uL  Urinalysis, Routine w reflex microscopic ([facility])  Result Value Ref Range   Color, Urine AMBER (A) YELLOW   APPearance CLEAR CLEAR   Specific Gravity, Urine 1.034 (H) 1.005 - 1.030   pH 5.5 5.0 - 8.0   Glucose, UA NEGATIVE NEGATIVE mg/dL   Hgb urine dipstick NEGATIVE NEGATIVE   Bilirubin Urine SMALL (A) NEGATIVE   Ketones, ur 15 (A) NEGATIVE mg/dL   Protein, ur NEGATIVE NEGATIVE mg/dL   Nitrite NEGATIVE NEGATIVE   Leukocytes, UA TRACE (A) NEGATIVE  Urine microscopic-add on  Result Value Ref Range   Squamous Epithelial / LPF 0-5 (A) NONE SEEN   WBC, UA 0-5 0 - 5 WBC/hpf   RBC / HPF 0-5 0 - 5 RBC/hpf   Bacteria, UA RARE (A) NONE SEEN   Urine-Other MUCOUS PRESENT    Ct Abdomen Pelvis W Contrast  01/07/2015  CLINICAL DATA:  Hernia, mid abdominal pain with nausea and vomiting EXAM: CT ABDOMEN AND PELVIS WITH CONTRAST TECHNIQUE: Multidetector CT imaging of the abdomen and pelvis was performed using the standard protocol following bolus administration of intravenous contrast. CONTRAST:  80mL OMNIPAQUE IOHEXOL 300 MG/ML  SOLN COMPARISON:  None available FINDINGS: Lower chest: Minor dependent basilar atelectasis. Cardiomegaly evident. No pericardial or pleural effusion. Small hiatal hernia evident. Hepatobiliary: No focal hepatic abnormality or biliary dilatation. Patent hepatic and portal veins. Gallbladder and biliary system unremarkable. Pancreas: No mass, inflammatory changes, or other significant  abnormality. Spleen: Within normal limits in size and appearance. Adrenals/Urinary Tract: No masses identified. No evidence of hydronephrosis. Stomach/Bowel: There are several markedly dilated loops of jejunum throughout the abdomen. Dilated jejunal loops lead directly into a umbilical hernia and the exiting loop is completely collapsed. Appearance is compatible with an incarcerated umbilical hernia and associated small bowel obstruction. No pneumatosis, significant wall thickening, or free air. No fluid collection or abscess. There are 2 additional small anterior abdominal ventral hernias containing portions of colon without obstruction, just above the incarcerated umbilical hernia. These are best demonstrated on the sagittal reconstructions. Vascular/Lymphatic: Minor aortic atherosclerosis. Negative for aneurysm. No adenopathy. Reproductive: No mass or other significant abnormality. Other: No inguinal abnormality. Musculoskeletal: No acute or significant osseous finding. Degenerative disc disease at L5-S1. IMPRESSION: Incarcerated  umbilical hernia with small bowel obstruction. Two additional small anterior abdominal ventral hernias containing nondilated loops of colon. Negative for abscess, perforation or free air. These results were called by telephone at the time of interpretation on 01/07/2015 at 5:10 pm to Dr. Rolland Porter, who verbally acknowledged these results. Electronically Signed   By: Judie Petit.  Shick M.D.   On: 01/07/2015 17:19      MDM   Final diagnoses:  Incarcerated hernia  SBO (small bowel obstruction) (HCC)    Patient with abdominal pain and multiple prior abdominal surgeries with current umbilical hernia.  CT findings consistent with incarcerated umbilical hernia and small bowel obstruction. Patient has had some vomiting today. Will insert NG tube. Patient discussed with Dr. Dwain Sarna from general surgery, who will come to see the patient. Pain is moderately well controlled now.   Roxy Horseman, PA-C 01/07/15 1804  Benjiman Core, MD 01/08/15 [phone]

## 2015-01-07 NOTE — ED Notes (Signed)
Pt has hernia below umbilicus for several years, has had hernia for several years but can't afford surgery. Has had pain for 1 month. Has not gotten bigger in size. Vomiting x 3 this morning with normal BM. Pt is a x 4. In NAD

## 2015-01-07 NOTE — H&P (Signed)
Logan Nelson is an 49 y.o. male.   Chief Complaint: abd pain vomiting known hernia HPI: 41 yom who states he has no medical problems but is a smoker and I think a hypertensive.  He has history of colectomy for what appears to be diverticulitis with colostomy at [facility] and then had a takedown at [facility].  He has had an incisional hernia at midline for years. This has been out a lot.  His daughter plays with it.  This occasionally causes some discomfort and some emesis.  He has not sought any repair of this. He smokes daily.  Last night began hurting more and had some emesis today. He states he is passing flatus and had a bm this am. It is not hurting now.  He underwent ct scan that showed a bowel obstruction at hernia and a likely swiss cheese defect.  He is now refusing an ng tube.    Past Medical History  Diagnosis Date  . Arthritis   . Hypertension    [facility] for diverticular disease with subsequent takedown.  No family history on file. Social History:  reports that he has been smoking.  He does not have any smokeless tobacco history on file. He reports that he drinks alcohol. His drug history is not on file.  Allergies: No Known Allergies  meds none regular  Results for orders placed or performed during the hospital encounter of 01/07/15 (from the past 48 hour(s))  Lipase, blood     Status: None   Collection Time: 01/07/15 12:50 PM  Result Value Ref Range   Lipase 22 11 - 51 U/L  Comprehensive metabolic panel     Status: Abnormal   Collection Time: 01/07/15 12:50 PM  Result Value Ref Range   Sodium 140 135 - 145 mmol/L   Potassium 3.6 3.5 - 5.1 mmol/L   Chloride 110 101 - 111 mmol/L   CO2 23 22 - 32 mmol/L   Glucose, Bld 119 (H) 65 - 99 mg/dL   BUN 14 6 - 20 mg/dL   Creatinine, Ser 0.88 0.61 - 1.24 mg/dL   Calcium 9.0 8.9 - 10.3 mg/dL   Total Protein 7.2 6.5 - 8.1 g/dL   Albumin 3.9 3.5 - 5.0 g/dL   AST 20 15 - 41 U/L   ALT 22 17 - 63 U/L   Alkaline Phosphatase 75 38 - 126  U/L   Total Bilirubin 0.6 0.3 - 1.2 mg/dL   GFR calc non Af Amer >60 >60 mL/min   GFR calc Af Amer >60 >60 mL/min    Comment: (NOTE) The eGFR has been calculated using the CKD EPI equation. This calculation has not been validated in all clinical situations. eGFR's persistently <60 mL/min signify possible Chronic Kidney Disease.    Anion gap 7 5 - 15  CBC     Status: Abnormal   Collection Time: 01/07/15 12:50 PM  Result Value Ref Range   WBC 9.7 4.0 - 10.5 K/uL   RBC 3.24 (L) 4.22 - 5.81 MIL/uL   Hemoglobin 13.1 13.0 - 17.0 g/dL   HCT 38.2 (L) 39.0 - 52.0 %   MCV 117.9 (H) 78.0 - 100.0 fL   MCH 40.4 (H) 26.0 - 34.0 pg   MCHC 34.3 30.0 - 36.0 g/dL   RDW 15.3 11.5 - 15.5 %   Platelets 296 150 - 400 K/uL  Urinalysis, Routine w reflex microscopic (not at [facility])     Status: Abnormal   Collection Time: 01/07/15  4:17 PM  Result Value Ref Range   Color, Urine AMBER (A) YELLOW    Comment: BIOCHEMICALS MAY BE AFFECTED BY COLOR   APPearance CLEAR CLEAR   Specific Gravity, Urine 1.034 (H) 1.005 - 1.030   pH 5.5 5.0 - 8.0   Glucose, UA NEGATIVE NEGATIVE mg/dL   Hgb urine dipstick NEGATIVE NEGATIVE   Bilirubin Urine SMALL (A) NEGATIVE   Ketones, ur 15 (A) NEGATIVE mg/dL   Protein, ur NEGATIVE NEGATIVE mg/dL   Nitrite NEGATIVE NEGATIVE   Leukocytes, UA TRACE (A) NEGATIVE  Urine microscopic-add on     Status: Abnormal   Collection Time: 01/07/15  4:17 PM  Result Value Ref Range   Squamous Epithelial / LPF 0-5 (A) NONE SEEN   WBC, UA 0-5 0 - 5 WBC/hpf   RBC / HPF 0-5 0 - 5 RBC/hpf   Bacteria, UA RARE (A) NONE SEEN   Urine-Other MUCOUS PRESENT    Ct Abdomen Pelvis W Contrast  01/07/2015  CLINICAL DATA:  Hernia, mid abdominal pain with nausea and vomiting EXAM: CT ABDOMEN AND PELVIS WITH CONTRAST TECHNIQUE: Multidetector CT imaging of the abdomen and pelvis was performed using the standard protocol following bolus administration of intravenous contrast. CONTRAST:  58m OMNIPAQUE IOHEXOL  300 MG/ML  SOLN COMPARISON:  None available FINDINGS: Lower chest: Minor dependent basilar atelectasis. Cardiomegaly evident. No pericardial or pleural effusion. Small hiatal hernia evident. Hepatobiliary: No focal hepatic abnormality or biliary dilatation. Patent hepatic and portal veins. Gallbladder and biliary system unremarkable. Pancreas: No mass, inflammatory changes, or other significant abnormality. Spleen: Within normal limits in size and appearance. Adrenals/Urinary Tract: No masses identified. No evidence of hydronephrosis. Stomach/Bowel: There are several markedly dilated loops of jejunum throughout the abdomen. Dilated jejunal loops lead directly into a umbilical hernia and the exiting loop is completely collapsed. Appearance is compatible with an incarcerated umbilical hernia and associated small bowel obstruction. No pneumatosis, significant wall thickening, or free air. No fluid collection or abscess. There are 2 additional small anterior abdominal ventral hernias containing portions of colon without obstruction, just above the incarcerated umbilical hernia. These are best demonstrated on the sagittal reconstructions. Vascular/Lymphatic: Minor aortic atherosclerosis. Negative for aneurysm. No adenopathy. Reproductive: No mass or other significant abnormality. Other: No inguinal abnormality. Musculoskeletal: No acute or significant osseous finding. Degenerative disc disease at L5-S1. IMPRESSION: Incarcerated umbilical hernia with small bowel obstruction. Two additional small anterior abdominal ventral hernias containing nondilated loops of colon. Negative for abscess, perforation or free air. These results were called by telephone at the time of interpretation on 01/07/2015 at 5:10 pm to Dr. MTanna Furry who verbally acknowledged these results. Electronically Signed   By: MJerilynn Mages  Shick M.D.   On: 01/07/2015 17:19    Review of Systems  Constitutional: Negative for fever and chills.  Respiratory:  Negative for cough and shortness of breath.   Cardiovascular: Negative for chest pain.  Gastrointestinal: Positive for nausea, vomiting and abdominal pain. Negative for blood in stool.  Genitourinary: Negative for dysuria and urgency.    Blood pressure 189/101, pulse 58, temperature 98.1 F (36.7 C), temperature source Oral, resp. rate 18, SpO2 97 %. Physical Exam  Vitals reviewed. Constitutional: He appears well-developed and well-nourished.  HENT:  Head: Normocephalic and atraumatic.  Eyes: No scleral icterus.  Neck: Neck supple.  Cardiovascular: Normal rate, regular rhythm and normal heart sounds.   Respiratory: Effort normal and breath sounds normal. He has no wheezes. He has no rales.  GI: Soft. He exhibits distension (mild). Bowel sounds are  decreased. There is no tenderness. A hernia is present. Hernia confirmed positive in the ventral area.       Assessment/Plan Incisional hernia  This hernia has been doing this for a while. This is just most significant episode.  He does not need urgent surgery. I do think he should just have this repaired likely although this is not ideal given weight and smoking.  I think that he needs ng decompression due to his distention and if he needs general anesthetic he should have this evacuated first as much as possible.  i will admit him, hopefully he will agree to decompression (I have spent some time explaining the rationale for this), and possible surgery this admission.    Deshanti Adcox 01/07/2015, 6:56 PM

## 2015-01-07 NOTE — ED Notes (Signed)
Attempted to give report 

## 2015-01-08 LAB — BASIC METABOLIC PANEL
ANION GAP: 5 (ref 5–15)
BUN: 11 mg/dL (ref 6–20)
CALCIUM: 8.5 mg/dL — AB (ref 8.9–10.3)
CO2: 27 mmol/L (ref 22–32)
CREATININE: 0.86 mg/dL (ref 0.61–1.24)
Chloride: 108 mmol/L (ref 101–111)
GLUCOSE: 91 mg/dL (ref 65–99)
Potassium: 3.2 mmol/L — ABNORMAL LOW (ref 3.5–5.1)
Sodium: 140 mmol/L (ref 135–145)

## 2015-01-08 LAB — CBC
HCT: 36.6 % — ABNORMAL LOW (ref 39.0–52.0)
HEMOGLOBIN: 12.4 g/dL — AB (ref 13.0–17.0)
MCH: 40 pg — ABNORMAL HIGH (ref 26.0–34.0)
MCHC: 33.9 g/dL (ref 30.0–36.0)
MCV: 118.1 fL — ABNORMAL HIGH (ref 78.0–100.0)
PLATELETS: 269 10*3/uL (ref 150–400)
RBC: 3.1 MIL/uL — ABNORMAL LOW (ref 4.22–5.81)
RDW: 15.4 % (ref 11.5–15.5)
WBC: 6.3 10*3/uL (ref 4.0–10.5)

## 2015-01-08 MED ORDER — PNEUMOCOCCAL VAC POLYVALENT 25 MCG/0.5ML IJ INJ
0.5000 mL | INJECTION | INTRAMUSCULAR | Status: AC
  Administered 2015-01-09: 0.5 mL via INTRAMUSCULAR
  Filled 2015-01-08: qty 0.5

## 2015-01-08 MED ORDER — HYDRALAZINE HCL 20 MG/ML IJ SOLN
10.0000 mg | Freq: Four times a day (QID) | INTRAMUSCULAR | Status: DC | PRN
  Administered 2015-01-09: 10 mg via INTRAVENOUS
  Filled 2015-01-08: qty 1

## 2015-01-08 MED ORDER — HYDROCHLOROTHIAZIDE 25 MG PO TABS
25.0000 mg | ORAL_TABLET | Freq: Every day | ORAL | Status: DC
Start: 2015-01-08 — End: 2015-01-09
  Administered 2015-01-08 – 2015-01-09 (×2): 25 mg via ORAL
  Filled 2015-01-08 (×2): qty 1

## 2015-01-08 MED ORDER — INFLUENZA VAC SPLIT QUAD 0.5 ML IM SUSY
0.5000 mL | PREFILLED_SYRINGE | INTRAMUSCULAR | Status: AC
  Administered 2015-01-09: 0.5 mL via INTRAMUSCULAR
  Filled 2015-01-08: qty 0.5

## 2015-01-08 MED ORDER — PANTOPRAZOLE SODIUM 40 MG PO TBEC: 40.0000 mg | DELAYED_RELEASE_TABLET | Freq: Every day | ORAL | Status: DC

## 2015-01-08 MED ORDER — CETYLPYRIDINIUM CHLORIDE 0.05 % MT LIQD
7.0000 mL | Freq: Two times a day (BID) | OROMUCOSAL | Status: DC
  Administered 2015-01-08 – 2015-01-09 (×2): 7 mL via OROMUCOSAL

## 2015-01-08 NOTE — Care Management Note (Signed)
Case Management Note  Patient Details  Name: Sherrill RaringDarryl Keith Roberson MRN: [id number] Date of Birth: 12/17/1965  Subjective/Objective:                    Action/Plan:   Expected Discharge Date:                  Expected Discharge Plan:  Home/Self Care  In-House Referral:     Discharge planning Services     Post Acute Care Choice:    Choice offered to:     DME Arranged:    DME Agency:     HH Arranged:    HH Agency:     Status of Service:  In process, will continue to follow  Medicare Important Message Given:    Date Medicare IM Given:    Medicare IM give by:    Date Additional Medicare IM Given:    Additional Medicare Important Message give by:     If discussed at Long Length of Stay Meetings, dates discussed:    Additional Comments: UR completed  Kingsley PlanWile, Aseret Hoffman Marie, RN 01/08/2015, 8:18 AM

## 2015-01-08 NOTE — Progress Notes (Signed)
Pt a/o, c/o abd pain PRN Morphine given as ordered, pts BP 175/99 on call Dr. Fredonia HighlandE. Wilson called and ordered PRN Hydralazine 10 mg IV to be given is SBP > 180 and/or DBP > 100, pt asymptomatic, pt resting comfortably

## 2015-01-08 NOTE — Progress Notes (Signed)
Patient ID: Logan Nelson, male   DOB: 01-17-1966, 49 y.o.   MRN: [id number]     [address] [place], [place] [address]    Phone: [phone] FAX: [phone]     Subjective: No n/v.  Had a BM.  abd pain resolved.  Normal WBC.  BP elevated, suspect he has undiagnosed HTN.   Objective:  Vital signs:  Filed Vitals:   01/07/15 2015 01/07/15 2026 01/08/15 0115 01/08/15 0536  BP: 142/80 150/85 150/89 139/94  Pulse: 64 65 67 65  Temp:  98.3 F (36.8 C) 98.2 F (36.8 C) 97.7 F (36.5 C)  TempSrc:  Oral Oral Oral  Resp:  20 18 19   Height:  5' 6"  (1.676 m)    Weight:  104.4 kg (230 lb 2.6 oz)    SpO2: 98% 94% 100% 99%    Last BM Date: 01/07/15 (per pt)  Intake/Output   Yesterday:  11/30 0701 - 12/01 0700 In: 871.7 [I.V.:871.7] Out: 1225 [Urine:275; Emesis/NG output:950] This shift:    I/O last 3 completed shifts: In: 871.7 [I.V.:871.7] Out: 1225 [Urine:275; Emesis/NG output:950]    Physical Exam: General: Pt awake/alert/oriented x4 in no acute distress Abdomen: Soft.  Nondistended. Bib tender.  Incisional hernia, soft and reducible.  No evidence of peritonitis.     Problem List:   Active Problems:   Incisional hernia    Results:   Labs: Results for orders placed or performed during the hospital encounter of 01/07/15 (from the past 48 hour(s))  Lipase, blood     Status: None   Collection Time: 01/07/15 12:50 PM  Result Value Ref Range   Lipase 22 11 - 51 U/L  Comprehensive metabolic panel     Status: Abnormal   Collection Time: 01/07/15 12:50 PM  Result Value Ref Range   Sodium 140 135 - 145 mmol/L   Potassium 3.6 3.5 - 5.1 mmol/L   Chloride 110 101 - 111 mmol/L   CO2 23 22 - 32 mmol/L   Glucose, Bld 119 (H) 65 - 99 mg/dL   BUN 14 6 - 20 mg/dL   Creatinine, Ser 0.88 0.61 - 1.24 mg/dL   Calcium 9.0 8.9 - 10.3 mg/dL   Total Protein 7.2 6.5 - 8.1 g/dL   Albumin 3.9 3.5 - 5.0 g/dL    AST 20 15 - 41 U/L   ALT 22 17 - 63 U/L   Alkaline Phosphatase 75 38 - 126 U/L   Total Bilirubin 0.6 0.3 - 1.2 mg/dL   GFR calc non Af Amer >60 >60 mL/min   GFR calc Af Amer >60 >60 mL/min    Comment: (NOTE) The eGFR has been calculated using the CKD EPI equation. This calculation has not been validated in all clinical situations. eGFR's persistently <60 mL/min signify possible Chronic Kidney Disease.    Anion gap 7 5 - 15  CBC     Status: Abnormal   Collection Time: 01/07/15 12:50 PM  Result Value Ref Range   WBC 9.7 4.0 - 10.5 K/uL   RBC 3.24 (L) 4.22 - 5.81 MIL/uL   Hemoglobin 13.1 13.0 - 17.0 g/dL   HCT 38.2 (L) 39.0 - 52.0 %   MCV 117.9 (H) 78.0 - 100.0 fL   MCH 40.4 (H) 26.0 - 34.0 pg   MCHC 34.3 30.0 - 36.0 g/dL   RDW 15.3 11.5 - 15.5 %   Platelets 296 150 - 400 K/uL  Urinalysis, Routine w reflex microscopic (not at [facility])     Status: Abnormal   Collection Time: 01/07/15  4:17 PM  Result Value Ref Range   Color, Urine AMBER (A) YELLOW    Comment: BIOCHEMICALS MAY BE AFFECTED BY COLOR   APPearance CLEAR CLEAR   Specific Gravity, Urine 1.034 (H) 1.005 - 1.030   pH 5.5 5.0 - 8.0   Glucose, UA NEGATIVE NEGATIVE mg/dL   Hgb urine dipstick NEGATIVE NEGATIVE   Bilirubin Urine SMALL (A) NEGATIVE   Ketones, ur 15 (A) NEGATIVE mg/dL   Protein, ur NEGATIVE NEGATIVE mg/dL   Nitrite NEGATIVE NEGATIVE   Leukocytes, UA TRACE (A) NEGATIVE  Urine microscopic-add on     Status: Abnormal   Collection Time: 01/07/15  4:17 PM  Result Value Ref Range   Squamous Epithelial / LPF 0-5 (A) NONE SEEN   WBC, UA 0-5 0 - 5 WBC/hpf   RBC / HPF 0-5 0 - 5 RBC/hpf   Bacteria, UA RARE (A) NONE SEEN   Urine-Other MUCOUS PRESENT   CBC     Status: Abnormal   Collection Time: 01/08/15  7:10 AM  Result Value Ref Range   WBC 6.3 4.0 - 10.5 K/uL   RBC 3.10 (L) 4.22 - 5.81 MIL/uL   Hemoglobin 12.4 (L) 13.0 - 17.0 g/dL   HCT 36.6 (L) 39.0 - 52.0 %   MCV 118.1 (H) 78.0 - 100.0 fL   MCH 40.0 (H) 26.0  - 34.0 pg   MCHC 33.9 30.0 - 36.0 g/dL   RDW 15.4 11.5 - 15.5 %   Platelets 269 150 - 400 K/uL    Imaging / Studies: Ct Abdomen Pelvis W Contrast  01/07/2015  CLINICAL DATA:  Hernia, mid abdominal pain with nausea and vomiting EXAM: CT ABDOMEN AND PELVIS WITH CONTRAST TECHNIQUE: Multidetector CT imaging of the abdomen and pelvis was performed using the standard protocol following bolus administration of intravenous contrast. CONTRAST:  42m OMNIPAQUE IOHEXOL 300 MG/ML  SOLN COMPARISON:  None available FINDINGS: Lower chest: Minor dependent basilar atelectasis. Cardiomegaly evident. No pericardial or pleural effusion. Small hiatal hernia evident. Hepatobiliary: No focal hepatic abnormality or biliary dilatation. Patent hepatic and portal veins. Gallbladder and biliary system unremarkable. Pancreas: No mass, inflammatory changes, or other significant abnormality. Spleen: Within normal limits in size and appearance. Adrenals/Urinary Tract: No masses identified. No evidence of hydronephrosis. Stomach/Bowel: There are several markedly dilated loops of jejunum throughout the abdomen. Dilated jejunal loops lead directly into a umbilical hernia and the exiting loop is completely collapsed. Appearance is compatible with an incarcerated umbilical hernia and associated small bowel obstruction. No pneumatosis, significant wall thickening, or free air. No fluid collection or abscess. There are 2 additional small anterior abdominal ventral hernias containing portions of colon without obstruction, just above the incarcerated umbilical hernia. These are best demonstrated on the sagittal reconstructions. Vascular/Lymphatic: Minor aortic atherosclerosis. Negative for aneurysm. No adenopathy. Reproductive: No mass or other significant abnormality. Other: No inguinal abnormality. Musculoskeletal: No acute or significant osseous finding. Degenerative disc disease at L5-S1. IMPRESSION: Incarcerated umbilical hernia with small  bowel obstruction. Two additional small anterior abdominal ventral hernias containing nondilated loops of colon. Negative for abscess, perforation or free air. These results were called by telephone at the time of interpretation on 01/07/2015 at 5:10 pm to Dr. MTanna Furry who verbally acknowledged these results. Electronically Signed   By: MJerilynn Mages  Shick M.D.   On: 01/07/2015 17:19    Medications / Allergies:  Scheduled Meds: . antiseptic  oral rinse  7 mL Mouth Rinse BID  . Barium Sulfate  450 mL Oral Once  . enoxaparin (LOVENOX) injection  40 mg Subcutaneous Q24H  . [START ON 01/09/2015] Influenza vac split quadrivalent PF  0.5 mL Intramuscular Tomorrow-1000  . pantoprazole (PROTONIX) IV  40 mg Intravenous QHS  . [START ON 01/09/2015] pneumococcal 23 valent vaccine  0.5 mL Intramuscular Tomorrow-1000   Continuous Infusions: . sodium chloride 100 mL/hr at 01/08/15 0529   PRN Meds:.acetaminophen **OR** acetaminophen, morphine injection, ondansetron **OR** ondansetron (ZOFRAN) IV  Antibiotics: Anti-infectives    None        Assessment/Plan Incisional hernia-soft and reducible, having BMs, benign abdominal exam.  DC NGT and allow for fulls.  Advance as tolerated.  Hopefully can avoid surgery in the acute setting.  He needs to stop smoking and lose weight. HTN-start HCTZ, may need ACE.  Establish in the community [facility] for further management Nicotine dependence-strongly recommended cessation VTE prophylaxis-SCD/lovenox Dispo-anticipate DC in AM  Erby Pian, [facility] Pager [phone](7A-4:30P) For consults and floor pages call [phone](7A-4:30P)  01/08/2015 9:12 AM

## 2015-01-09 DIAGNOSIS — I10 Essential (primary) hypertension: Secondary | ICD-10-CM | POA: Clinically undetermined

## 2015-01-09 MED ORDER — LISINOPRIL-HYDROCHLOROTHIAZIDE 10-12.5 MG PO TABS: 1.0000 | ORAL_TABLET | Freq: Every day | ORAL | Status: DC

## 2015-01-09 MED ORDER — LISINOPRIL 5 MG PO TABS
5.0000 mg | ORAL_TABLET | Freq: Every day | ORAL | Status: DC
  Administered 2015-01-09: 5 mg via ORAL
  Filled 2015-01-09: qty 1

## 2015-01-09 NOTE — Progress Notes (Signed)
pts BP @ 0009 167/104, PRN Hydralazine given, pt stated he has BP meds at home but does not always take them, Staff explained to pt if the doctor prescribes BP meds for him to take it is to keep his BP under control, pt verbalized understanding, will recheck BP in about an hour

## 2015-01-09 NOTE — Progress Notes (Signed)
AVS given to patient. IV removed. Meals tolerated well for discharge.  Instructions understood verbally.  Belongings packed. Transportation arranged by patient.

## 2015-01-09 NOTE — Care Management Note (Signed)
Case Management Note  Patient Details  Name: Logan Nelson MRN: [id number] Date of Birth: 11/09/1965  Subjective/Objective:                    Action/Plan:  Explained MATCH letter to patient . Patient states he does not have $3 co pay for Lisinopril - hydrochlorothiazide . Over ride for co pay entered . [facility] information given .   Patient voiced understanding of above  Expected Discharge Date:                  Expected Discharge Plan:  Home/Self Care  In-House Referral:     Discharge planning Services  CM Consult, [facility] Clinic, [facility], Medication Assistance  Post Acute Care Choice:    Choice offered to:  Patient  DME Arranged:    DME Agency:     HH Arranged:    HH Agency:     Status of Service:  Completed, signed off  Medicare Important Message Given:    Date Medicare IM Given:    Medicare IM give by:    Date Additional Medicare IM Given:    Additional Medicare Important Message give by:     If discussed at Long Length of Stay Meetings, dates discussed:    Additional Comments:  Logan Nelson, Logan Kobayashi Marie, RN 01/09/2015, 9:58 AM

## 2015-01-09 NOTE — Discharge Instructions (Signed)
DASH Eating Plan  DASH stands for "Dietary Approaches to Stop Hypertension." The DASH eating plan is a healthy eating plan that has been shown to reduce high blood pressure (hypertension). Additional health benefits may include reducing the risk of type 2 diabetes mellitus, heart disease, and stroke. The DASH eating plan may also help with weight loss.  WHAT DO I NEED TO KNOW ABOUT THE DASH EATING PLAN?  For the DASH eating plan, you will follow these general guidelines:  · Choose foods with a percent daily value for sodium of less than 5% (as listed on the food label).  · Use salt-free seasonings or herbs instead of table salt or sea salt.  · Check with your health care provider or pharmacist before using salt substitutes.  · Eat lower-sodium products, often labeled as "lower sodium" or "no salt added."  · Eat fresh foods.  · Eat more vegetables, fruits, and low-fat dairy products.  · Choose whole grains. Look for the word "whole" as the first word in the ingredient list.  · Choose fish and skinless chicken or turkey more often than red meat. Limit fish, poultry, and meat to 6 oz (170 g) each day.  · Limit sweets, desserts, sugars, and sugary drinks.  · Choose heart-healthy fats.  · Limit cheese to 1 oz (28 g) per day.  · Eat more home-cooked food and less restaurant, buffet, and fast food.  · Limit fried foods.  · Cook foods using methods other than frying.  · Limit canned vegetables. If you do use them, rinse them well to decrease the sodium.  · When eating at a restaurant, ask that your food be prepared with less salt, or no salt if possible.  WHAT FOODS CAN I EAT?  Seek help from a dietitian for individual calorie needs.  Grains  Whole grain or whole wheat bread. Brown rice. Whole grain or whole wheat pasta. Quinoa, bulgur, and whole grain cereals. Low-sodium cereals. Corn or whole wheat flour tortillas. Whole grain cornbread. Whole grain crackers. Low-sodium crackers.  Vegetables  Fresh or frozen vegetables  (raw, steamed, roasted, or grilled). Low-sodium or reduced-sodium tomato and vegetable juices. Low-sodium or reduced-sodium tomato sauce and paste. Low-sodium or reduced-sodium canned vegetables.   Fruits  All fresh, canned (in natural juice), or frozen fruits.  Meat and Other Protein Products  Ground beef (85% or leaner), grass-fed beef, or beef trimmed of fat. Skinless chicken or turkey. Ground chicken or turkey. Pork trimmed of fat. All fish and seafood. Eggs. Dried beans, peas, or lentils. Unsalted nuts and seeds. Unsalted canned beans.  Dairy  Low-fat dairy products, such as skim or 1% milk, 2% or reduced-fat cheeses, low-fat ricotta or cottage cheese, or plain low-fat yogurt. Low-sodium or reduced-sodium cheeses.  Fats and Oils  Tub margarines without trans fats. Light or reduced-fat mayonnaise and salad dressings (reduced sodium). Avocado. Safflower, olive, or canola oils. Natural peanut or almond butter.  Other  Unsalted popcorn and pretzels.  The items listed above may not be a complete list of recommended foods or beverages. Contact your dietitian for more options.  WHAT FOODS ARE NOT RECOMMENDED?  Grains  White bread. White pasta. White rice. Refined cornbread. Bagels and croissants. Crackers that contain trans fat.  Vegetables  Creamed or fried vegetables. Vegetables in a cheese sauce. Regular canned vegetables. Regular canned tomato sauce and paste. Regular tomato and vegetable juices.  Fruits  Dried fruits. Canned fruit in light or heavy syrup. Fruit juice.  Meat and Other Protein   Products  Fatty cuts of meat. Ribs, chicken wings, bacon, sausage, bologna, salami, chitterlings, fatback, hot dogs, bratwurst, and packaged luncheon meats. Salted nuts and seeds. Canned beans with salt.  Dairy  Whole or 2% milk, cream, half-and-half, and cream cheese. Whole-fat or sweetened yogurt. Full-fat cheeses or blue cheese. Nondairy creamers and whipped toppings. Processed cheese, cheese spreads, or cheese  curds.  Condiments  Onion and garlic salt, seasoned salt, table salt, and sea salt. Canned and packaged gravies. Worcestershire sauce. Tartar sauce. Barbecue sauce. Teriyaki sauce. Soy sauce, including reduced sodium. Steak sauce. Fish sauce. Oyster sauce. Cocktail sauce. Horseradish. Ketchup and mustard. Meat flavorings and tenderizers. Bouillon cubes. Hot sauce. Tabasco sauce. Marinades. Taco seasonings. Relishes.  Fats and Oils  Butter, stick margarine, lard, shortening, ghee, and bacon fat. Coconut, palm kernel, or palm oils. Regular salad dressings.  Other  Pickles and olives. Salted popcorn and pretzels.  The items listed above may not be a complete list of foods and beverages to avoid. Contact your dietitian for more information.  WHERE CAN I FIND MORE INFORMATION?  National Heart, Lung, and Blood Institute: www.nhlbi.nih.gov/health/health-topics/topics/dash/     This information is not intended to replace advice given to you by your health care provider. Make sure you discuss any questions you have with your health care provider.     Document Released: 01/13/2011 Document Revised: 02/14/2014 Document Reviewed: 11/28/2012  Elsevier Interactive Patient Education ©2016 Elsevier Inc.

## 2015-01-09 NOTE — Discharge Summary (Signed)
Physician Discharge Summary  Sherrill RaringDarryl Keith Birchard QIO:[id number] DOB: 10/28/1965 DOA: 01/07/2015  PCP: No PCP Per Patient  Consultation: none  Admit date: 01/07/2015 Discharge date: 01/09/2015  Recommendations for Outpatient Follow-up:   Follow-up Information    Follow up with [facility]. Schedule an appointment as soon as possible for a visit in 1 week.   Why:  to recheck your blood pressure and bloodwork   Contact information:   [address] [facility] [place] [phone]      Follow up with Emelia LoronWAKEFIELD,MATTHEW, MD.   Specialty:  General Surgery   Why:  to follow up on your hernia, once you stop smoking    Contact information:   [address] [place] [id number] [phone]      Discharge Diagnoses:  1. Incisional hernia 2. Hypertension 3. Nicotine dependence    Surgical Procedure: none  Discharge Condition: stable Disposition: home  Diet recommendation: heart healthy   Filed Weights   01/07/15 2026  Weight: 104.4 kg (230 lb 2.6 oz)    Filed Vitals:   01/09/15 0056 01/09/15 0517  BP: 151/88 144/84  Pulse:  57  Temp:  98.9 F (37.2 C)  Resp:  20      Hospital Course:  Meshilem Feliciana RossettiKeith Meskill is a 49 year old male with a history of tobacco use, obesity, colectomy for likely diverticulitis with subsequent takedown who [facility] with abdominal pain secondary to his known hernia.  CT scan showed a bowel obstruction.  He was admitted.  Refused a gastric tube.  He quickly improved and began having bowel movements.  Diet was advanced. He was noted to have an elevated blood pressure and anti-hypertensives were initiated.  I discussed with him diet changes, tobacco cessation and weight loss.  He is to follow up in the wellness clinic in 1 week for a BMP.  I discussed with him risks and benefits of HCTZ/Lisinopril including but not limited to hyper/hypokalemia, renal failure, cough.  He verbalizes  understanding.  CM was consulted to help with cost.   As for the hernia, he understands that he needs to stop smoking and lose weight before surgical consideration.  He can therefore follow up with Dr. Dwain SarnaWakefield after he has made these lifestyle changes.  We discussed warning signs that warrant immediate attention such as nausea, vomiting, constipation, abdominal pain, distention, redness at the site.     Physical Exam: General: Pt awake/alert/oriented x4 in no acute distress Abdomen: Soft. Nondistended. Bib tender. Incisional hernia, soft and reducible. No evidence of peritonitis.    Discharge Instructions     Medication List    TAKE these medications        lisinopril-hydrochlorothiazide 10-12.5 MG tablet  Commonly known as:  PRINZIDE,ZESTORETIC  Take 1 tablet by mouth daily.     THERAFLU FLU/COLD PO  Take 1 packet by mouth 2 (two) times daily.           Follow-up Information    Follow up with [facility]. Schedule an appointment as soon as possible for a visit in 1 week.   Why:  to recheck your blood pressure and bloodwork   Contact information:   [address] [facility] [place] [id number] [phone]      Follow up with Emelia LoronWAKEFIELD,MATTHEW, MD.   Specialty:  General Surgery   Why:  to follow up on your hernia, once you stop smoking    Contact information:  [address] [place] [address] [phone]        The results of significant diagnostics from this hospitalization (including imaging, microbiology, ancillary and laboratory) are listed below for reference.    Significant Diagnostic Studies: Ct Abdomen Pelvis W Contrast  01/07/2015  CLINICAL DATA:  Hernia, mid abdominal pain with nausea and vomiting EXAM: CT ABDOMEN AND PELVIS WITH CONTRAST TECHNIQUE: Multidetector CT imaging of the abdomen and pelvis was performed using the standard protocol following bolus administration of intravenous contrast.  CONTRAST:  80mL OMNIPAQUE IOHEXOL 300 MG/ML  SOLN COMPARISON:  None available FINDINGS: Lower chest: Minor dependent basilar atelectasis. Cardiomegaly evident. No pericardial or pleural effusion. Small hiatal hernia evident. Hepatobiliary: No focal hepatic abnormality or biliary dilatation. Patent hepatic and portal veins. Gallbladder and biliary system unremarkable. Pancreas: No mass, inflammatory changes, or other significant abnormality. Spleen: Within normal limits in size and appearance. Adrenals/Urinary Tract: No masses identified. No evidence of hydronephrosis. Stomach/Bowel: There are several markedly dilated loops of jejunum throughout the abdomen. Dilated jejunal loops lead directly into a umbilical hernia and the exiting loop is completely collapsed. Appearance is compatible with an incarcerated umbilical hernia and associated small bowel obstruction. No pneumatosis, significant wall thickening, or free air. No fluid collection or abscess. There are 2 additional small anterior abdominal ventral hernias containing portions of colon without obstruction, just above the incarcerated umbilical hernia. These are best demonstrated on the sagittal reconstructions. Vascular/Lymphatic: Minor aortic atherosclerosis. Negative for aneurysm. No adenopathy. Reproductive: No mass or other significant abnormality. Other: No inguinal abnormality. Musculoskeletal: No acute or significant osseous finding. Degenerative disc disease at L5-S1. IMPRESSION: Incarcerated umbilical hernia with small bowel obstruction. Two additional small anterior abdominal ventral hernias containing nondilated loops of colon. Negative for abscess, perforation or free air. These results were called by telephone at the time of interpretation on 01/07/2015 at 5:10 pm to Dr. Rolland Porter, who verbally acknowledged these results. Electronically Signed   By: Judie Petit.  Shick M.D.   On: 01/07/2015 17:19    Microbiology: No results found for this or any  previous visit (from the past 240 hour(s)).   Labs: Basic Metabolic Panel:  Recent Labs Lab 01/07/15 1250 01/08/15 0710  NA 140 140  K 3.6 3.2*  CL 110 108  CO2 23 27  GLUCOSE 119* 91  BUN 14 11  CREATININE 0.88 0.86  CALCIUM 9.0 8.5*   Liver Function Tests:  Recent Labs Lab 01/07/15 1250  AST 20  ALT 22  ALKPHOS 75  BILITOT 0.6  PROT 7.2  ALBUMIN 3.9    Recent Labs Lab 01/07/15 1250  LIPASE 22   No results for input(s): AMMONIA in the last 168 hours. CBC:  Recent Labs Lab 01/07/15 1250 01/08/15 0710  WBC 9.7 6.3  HGB 13.1 12.4*  HCT 38.2* 36.6*  MCV 117.9* 118.1*  PLT 296 269   Cardiac Enzymes: No results for input(s): CKTOTAL, CKMB, CKMBINDEX, TROPONINI in the last 168 hours. BNP: BNP (last 3 results) No results for input(s): BNP in the last 8760 hours.  ProBNP (last 3 results) No results for input(s): PROBNP in the last 8760 hours.  CBG: No results for input(s): GLUCAP in the last 168 hours.  Active Problems:   Incisional hernia   Time coordinating discharge: <30 mins   Signed:  Brylan Seubert, ANP-BC

## 2015-07-01 ENCOUNTER — Telehealth: Payer: Self-pay | Admitting: *Deleted

## 2015-07-01 ENCOUNTER — Emergency Department (HOSPITAL_COMMUNITY): Payer: Self-pay

## 2015-07-01 ENCOUNTER — Emergency Department (HOSPITAL_COMMUNITY)
Admission: EM | Admit: 2015-07-01 | Discharge: 2015-07-01 | Disposition: A | Discharge status: Home / Self Care | Payer: Self-pay | Attending: Emergency Medicine | Admitting: Emergency Medicine

## 2015-07-01 ENCOUNTER — Encounter (HOSPITAL_COMMUNITY): Payer: Self-pay | Admitting: Emergency Medicine

## 2015-07-01 DIAGNOSIS — F17200 Nicotine dependence, unspecified, uncomplicated: Secondary | ICD-10-CM | POA: Insufficient documentation

## 2015-07-01 DIAGNOSIS — Z791 Long term (current) use of non-steroidal anti-inflammatories (NSAID): Secondary | ICD-10-CM | POA: Insufficient documentation

## 2015-07-01 DIAGNOSIS — I10 Essential (primary) hypertension: Secondary | ICD-10-CM | POA: Insufficient documentation

## 2015-07-01 DIAGNOSIS — H6691 Otitis media, unspecified, right ear: Secondary | ICD-10-CM | POA: Insufficient documentation

## 2015-07-01 LAB — CBC WITH DIFFERENTIAL/PLATELET
BASOS ABS: 0 10*3/uL (ref 0.0–0.1)
Basophils Relative: 0 %
Eosinophils Absolute: 0.3 10*3/uL (ref 0.0–0.7)
Eosinophils Relative: 6 %
HEMATOCRIT: 33.9 % — AB (ref 39.0–52.0)
Hemoglobin: 11.8 g/dL — ABNORMAL LOW (ref 13.0–17.0)
LYMPHS ABS: 2.2 10*3/uL (ref 0.7–4.0)
Lymphocytes Relative: 39 %
MCH: 42 pg — ABNORMAL HIGH (ref 26.0–34.0)
MCHC: 34.8 g/dL (ref 30.0–36.0)
MCV: 120.6 fL — ABNORMAL HIGH (ref 78.0–100.0)
MONO ABS: 0.3 10*3/uL (ref 0.1–1.0)
MONOS PCT: 6 %
NEUTROS PCT: 49 %
Neutro Abs: 2.8 10*3/uL (ref 1.7–7.7)
PLATELETS: 253 10*3/uL (ref 150–400)
RBC: 2.81 MIL/uL — AB (ref 4.22–5.81)
RDW: 14.6 % (ref 11.5–15.5)
WBC: 5.6 10*3/uL (ref 4.0–10.5)

## 2015-07-01 LAB — BASIC METABOLIC PANEL
ANION GAP: 5 (ref 5–15)
BUN: 16 mg/dL (ref 6–20)
CALCIUM: 8.9 mg/dL (ref 8.9–10.3)
CO2: 21 mmol/L — ABNORMAL LOW (ref 22–32)
Chloride: 113 mmol/L — ABNORMAL HIGH (ref 101–111)
Creatinine, Ser: 0.8 mg/dL (ref 0.61–1.24)
Glucose, Bld: 100 mg/dL — ABNORMAL HIGH (ref 65–99)
Potassium: 4.2 mmol/L (ref 3.5–5.1)
Sodium: 139 mmol/L (ref 135–145)

## 2015-07-01 IMAGING — DX DG CHEST 2V
2 series · 2 of 2 positions shown · non-contrast
Comparison: [DATE]

CLINICAL DATA: Congestion for 2-3 days, pain RIGHT-side of face,
history asthma, smoking, hypertension

EXAM:
CHEST  2 VIEW

[chest pa]
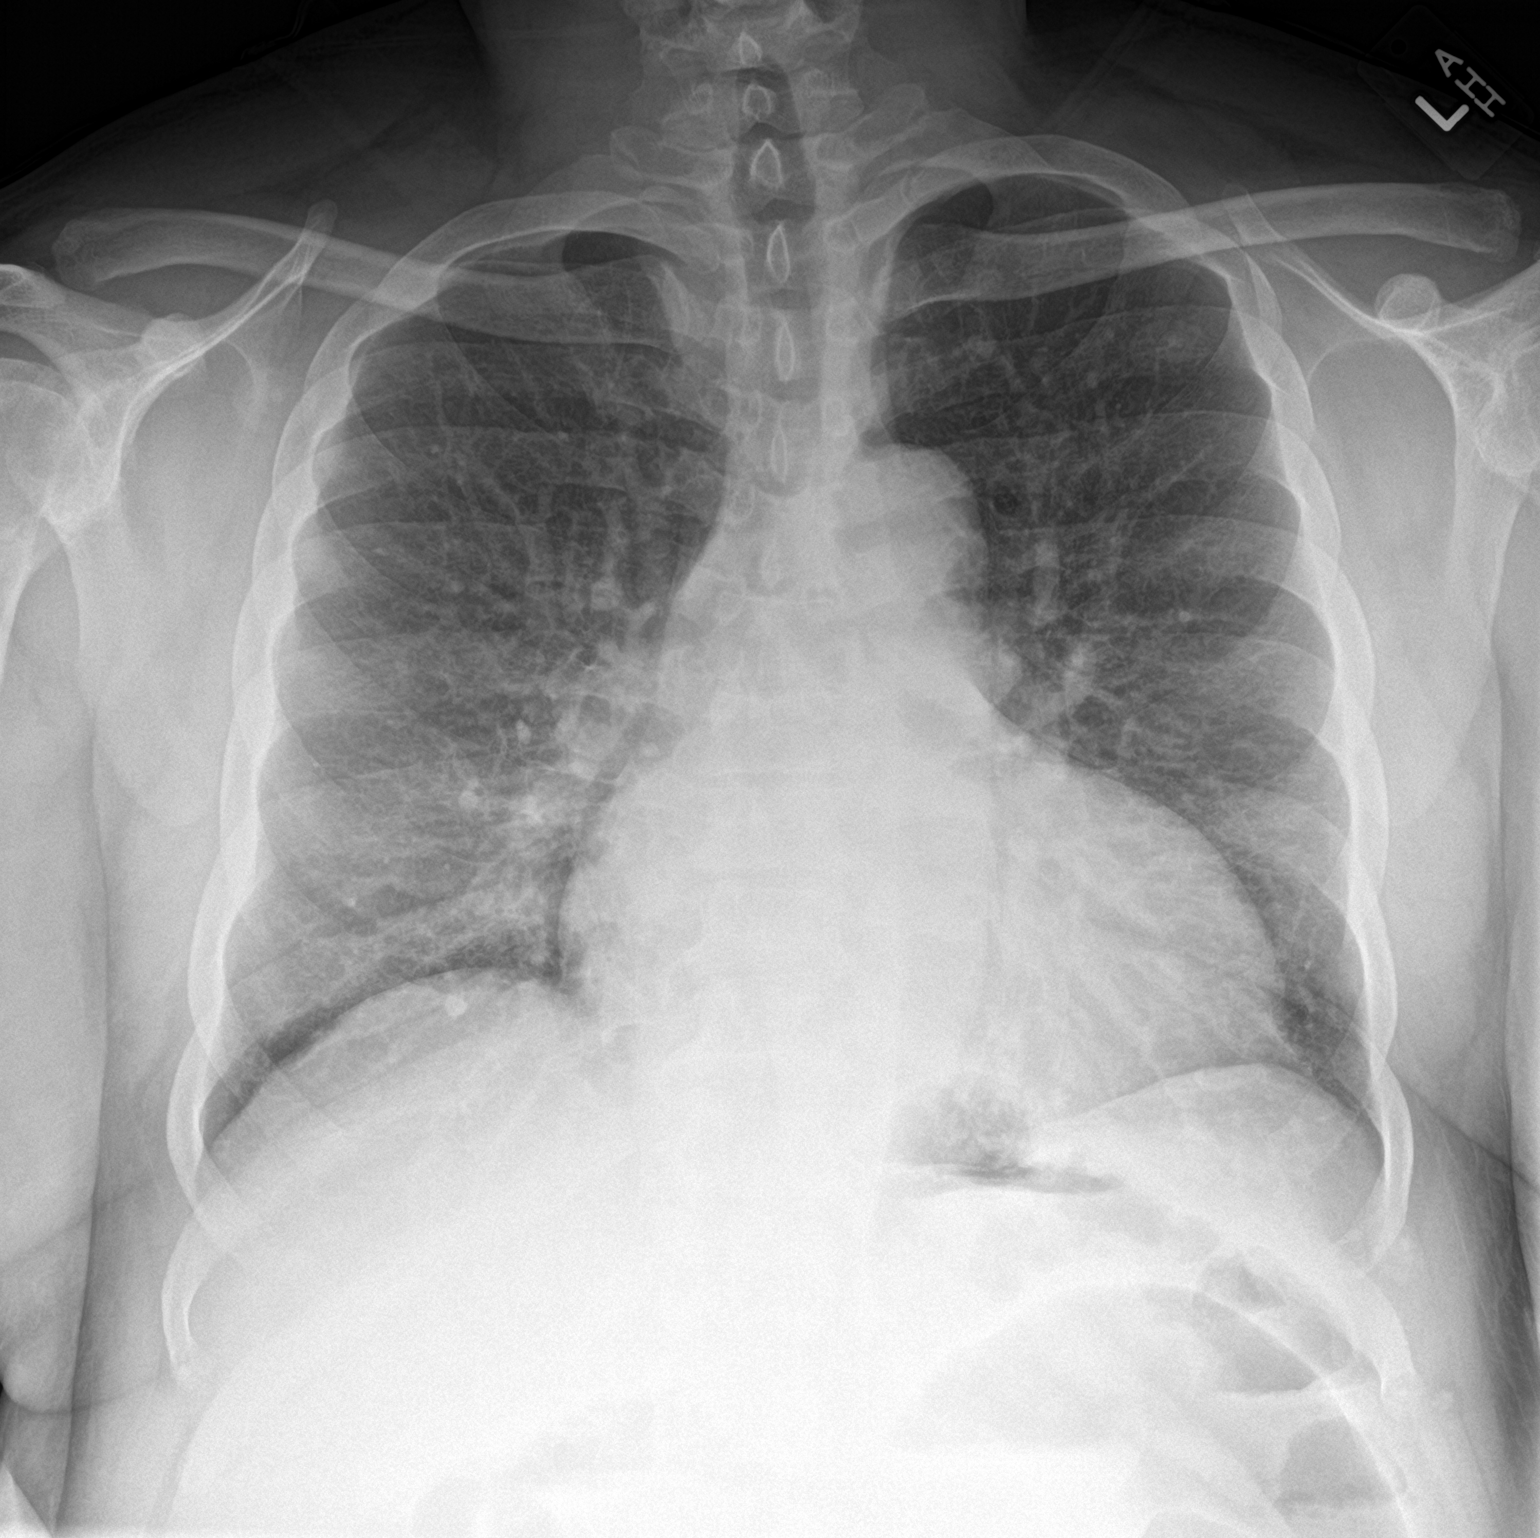

[chest lat]
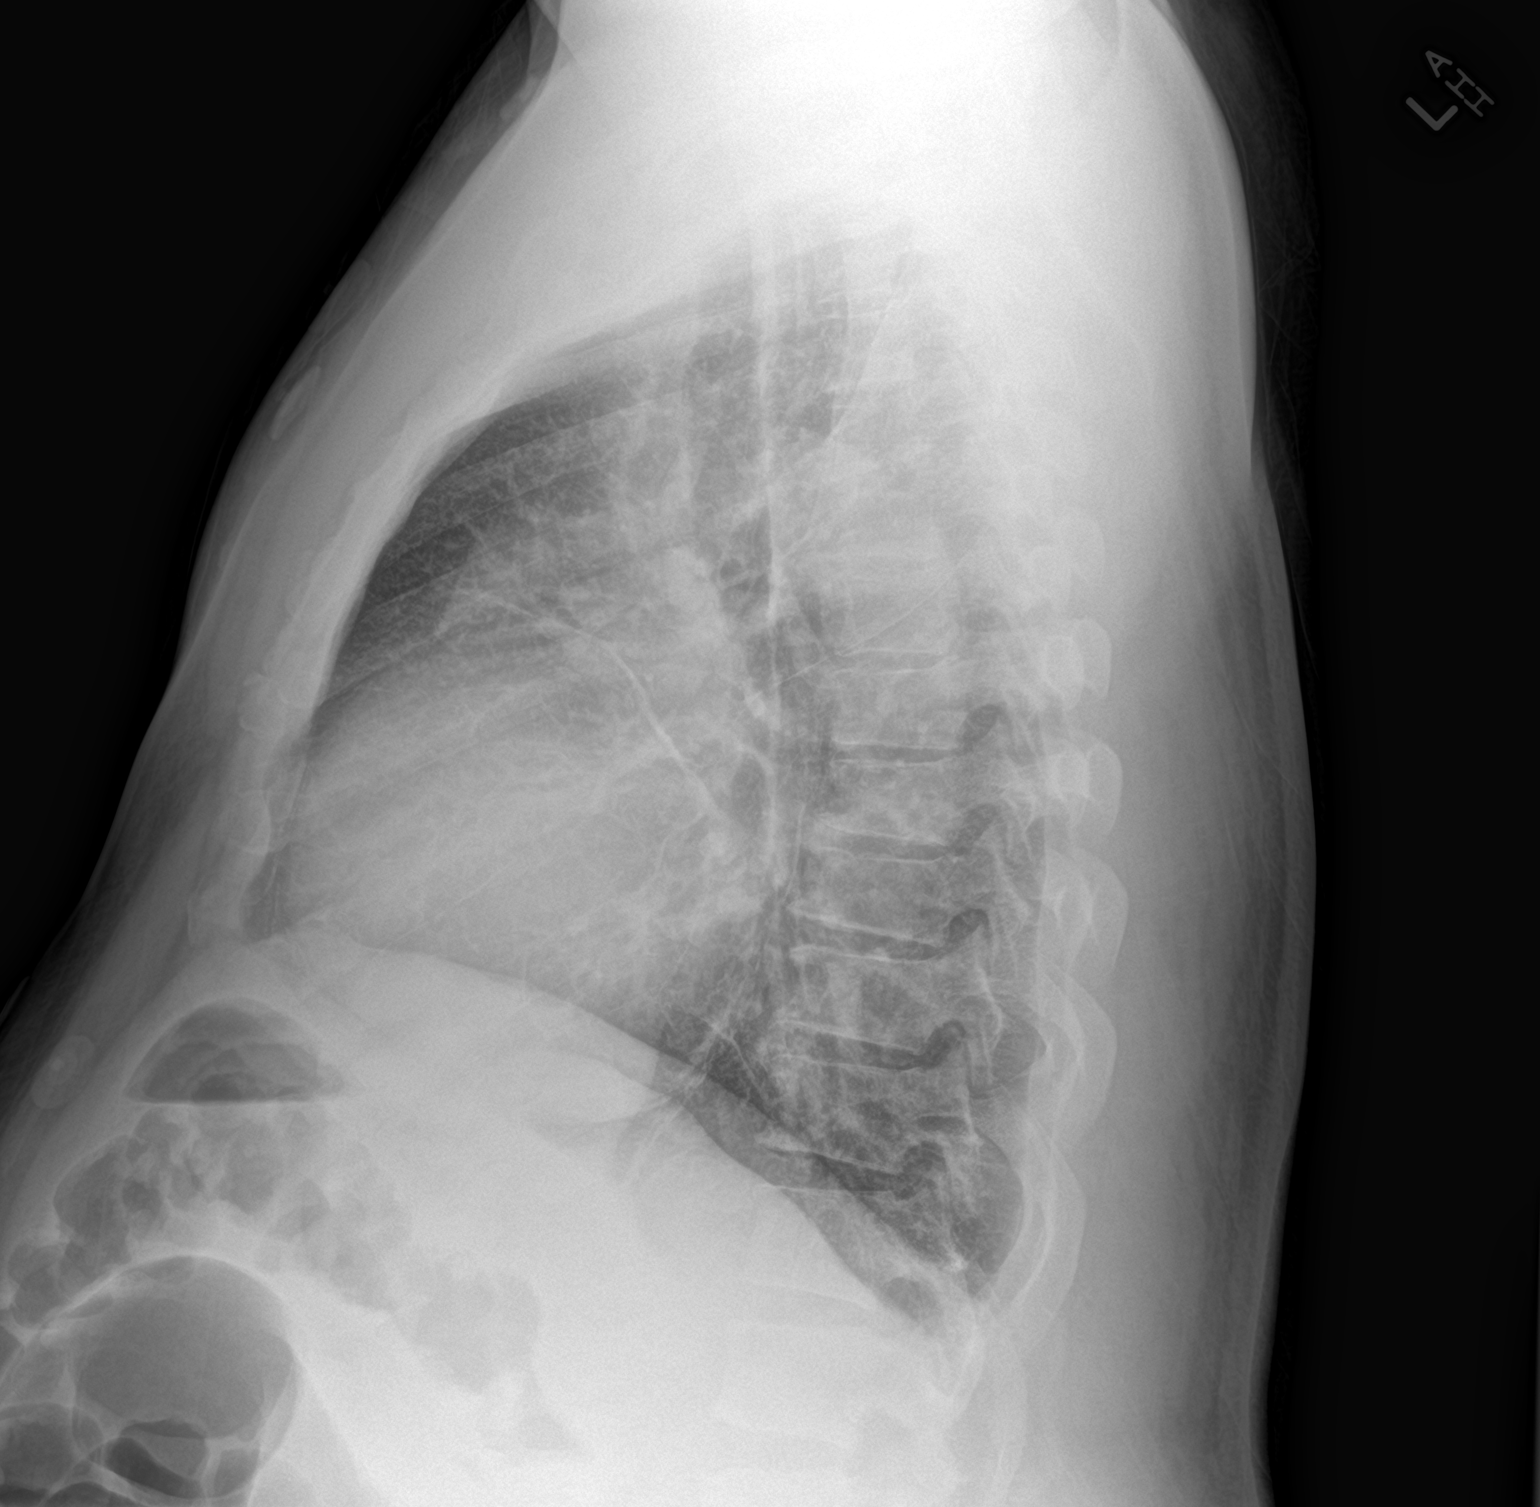

[2 of 2 positions shown; findings below may reference images not displayed]

FINDINGS: Enlargement of cardiac silhouette with pulmonary vascular
congestion.

Mediastinal contours normal.

Slight diffuse interstitial prominence, which may be chronic when
accounting for differences in technique.

Minimal central peribronchial thickening.

No segmental infiltrate, pleural effusion or pneumothorax.

Linear atelectasis versus scarring anterior mid lungs noted on
lateral view, favor RIGHT middle lobe.

Bones unremarkable.
IMPRESSION: Enlargement of cardiac silhouette with pulmonary vascular
congestion.

Bronchitic changes with diffuse interstitial prominence which may be
chronic.

Subsegmental atelectasis versus scarring likely RIGHT middle lobe.

## 2015-07-01 MED ORDER — OXYCODONE-ACETAMINOPHEN 5-325 MG PO TABS
1.0000 | ORAL_TABLET | Freq: Once | ORAL | Status: AC
  Administered 2015-07-01: 1 via ORAL
  Filled 2015-07-01: qty 1

## 2015-07-01 MED ORDER — AMOXICILLIN 500 MG PO CAPS: 500.0000 mg | ORAL_CAPSULE | Freq: Three times a day (TID) | ORAL | Status: DC

## 2015-07-01 MED ORDER — OXYCODONE-ACETAMINOPHEN 5-325 MG PO TABS: 1.0000 | ORAL_TABLET | Freq: Four times a day (QID) | ORAL | Status: DC | PRN

## 2015-07-01 MED ORDER — LISINOPRIL-HYDROCHLOROTHIAZIDE 10-12.5 MG PO TABS: 1.0000 | ORAL_TABLET | Freq: Every day | ORAL | Status: DC

## 2015-07-01 NOTE — Progress Notes (Signed)
CM consulted by ED unit secretary  [facility] with Edp Rubin Pay[facility]ering about consult for uninsured PCP and medication assist cm will check uninsured PCPs for possible availability available appointment for follow-up care

## 2015-07-01 NOTE — Progress Notes (Addendum)
ED CM [facility] Pt confirmed he had his prescriptions for lisinopril, oxycodone and amoxicillin CM discussed [facility] $4 list Cm reviewed with pt cost of medication needed on today Pt states he can call a male he works for to assist with getting what is needed today for cost of his prescriptions Cm discussed goodrx, needymeds, use of library if needed to get online and [place] med assist for future assist with medication Pt confirms having issues with reading related to visual issues Pt states his daughter can get him access to internet [employer] all resources Cm encouraged pt to review and get assist with from his daughter  Pt stated he was attempting to get disability, 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   CM spoke with pt who [place].  CM discussed and provided written information to assist pt with determining choice for uninsured accepting pcps, discussed the importance of pcp vs EDP services for f/u care, www.needymeds.org, www.goodrx.com, discounted pharmacies and other [place] resources such as [facility] , [facility], affordable care act, financial assistance, uninsured dental services, [place] med assist, DSS and  health department  Reviewed resources for [place] uninsured accepting pcps like [facility], family medicine at [address], [facility], [facility], local urgent care centers, [facility], [facility], general medical clinics, [facility], [facility] urgent care plus others, medication resources, [facility] out patient pharmacies and housing Pt voiced understanding and  appreciation of resources provided   Provided [facility] contact information Pt agreed to a referral Cm completed referral Pt to be contact by [facility] clinical liason Cm verified pt address and contact number in EPIC as correct Pt showed Cm his cell phone  Because of cost pt prefers a referral to [facility] for a more discounted pcp cost States he can not afford regularly a $50-$200 cost at some of the uninsured drs listed on [employer] list  Walked pt out of [facility]  Pt was not a MATCH candidate at this time -MATCHProgram was used by this pt on 01/09/15 at [facility] for HTN medication [place]([facility] [place]) Sent referral to [facility] CM

## 2015-07-01 NOTE — Discharge Instructions (Signed)
Hypertension °Hypertension, commonly called high blood pressure, is when the force of blood pumping through your arteries is too strong. Your arteries are the blood vessels that carry blood from your heart throughout your body. A blood pressure reading consists of a higher number over a lower number, such as 110/72. The higher number (systolic) is the pressure inside your arteries when your heart pumps. The lower number (diastolic) is the pressure inside your arteries when your heart relaxes. Ideally you want your blood pressure below 120/80. °Hypertension forces your heart to work harder to pump blood. Your arteries may become narrow or stiff. Having untreated or uncontrolled hypertension can cause heart attack, stroke, kidney disease, and other problems. °RISK FACTORS °Some risk factors for high blood pressure are controllable. Others are not.  °Risk factors you cannot control include:  °· Race. You may be at higher risk if you are African American. °· Age. Risk increases with age. °· Gender. Men are at higher risk than women before age 45 years. After age 65, women are at higher risk than men. °Risk factors you can control include: °· Not getting enough exercise or physical activity. °· Being overweight. °· Getting too much fat, sugar, calories, or salt in your diet. °· Drinking too much alcohol. °SIGNS AND SYMPTOMS °Hypertension does not usually cause signs or symptoms. Extremely high blood pressure (hypertensive crisis) may cause headache, anxiety, shortness of breath, and nosebleed. °DIAGNOSIS °To check if you have hypertension, your health care provider will measure your blood pressure while you are seated, with your arm held at the level of your heart. It should be measured at least twice using the same arm. Certain conditions can cause a difference in blood pressure between your right and left arms. A blood pressure reading that is higher than normal on one occasion does not mean that you need treatment. If  it is not clear whether you have high blood pressure, you may be asked to return on a different day to have your blood pressure checked again. Or, you may be asked to monitor your blood pressure at home for 1 or more weeks. °TREATMENT °Treating high blood pressure includes making lifestyle changes and possibly taking medicine. Living a healthy lifestyle can help lower high blood pressure. You may need to change some of your habits. °Lifestyle changes may include: °· Following the DASH diet. This diet is high in fruits, vegetables, and whole grains. It is low in salt, red meat, and added sugars. °· Keep your sodium intake below 2,300 mg per day. °· Getting at least 30-45 minutes of aerobic exercise at least 4 times per week. °· Losing weight if necessary. °· Not smoking. °· Limiting alcoholic beverages. °· Learning ways to reduce stress. °Your health care provider may prescribe medicine if lifestyle changes are not enough to get your blood pressure under control, and if one of the following is true: °· You are 18-59 years of age and your systolic blood pressure is above 140. °· You are 60 years of age or older, and your systolic blood pressure is above 150. °· Your diastolic blood pressure is above 90. °· You have diabetes, and your systolic blood pressure is over 140 or your diastolic blood pressure is over 90. °· You have kidney disease and your blood pressure is above 140/90. °· You have heart disease and your blood pressure is above 140/90. °Your personal target blood pressure may vary depending on your medical conditions, your age, and other factors. °HOME CARE INSTRUCTIONS °·   Have your blood pressure rechecked as directed by your health care provider.   °· Take medicines only as directed by your health care provider. Follow the directions carefully. Blood pressure medicines must be taken as prescribed. The medicine does not work as well when you skip doses. Skipping doses also puts you at risk for  problems. °· Do not smoke.   °· Monitor your blood pressure at home as directed by your health care provider.  °SEEK MEDICAL CARE IF:  °· You think you are having a reaction to medicines taken. °· You have recurrent headaches or feel dizzy. °· You have swelling in your ankles. °· You have trouble with your vision. °SEEK IMMEDIATE MEDICAL CARE IF: °· You develop a severe headache or confusion. °· You have unusual weakness, numbness, or feel faint. °· You have severe chest or abdominal pain. °· You vomit repeatedly. °· You have trouble breathing. °MAKE SURE YOU:  °· Understand these instructions. °· Will watch your condition. °· Will get help right away if you are not doing well or get worse. °  °This information is not intended to replace advice given to you by your health care provider. Make sure you discuss any questions you have with your health care provider. °  °Document Released: 01/24/2005 Document Revised: 06/10/2014 Document Reviewed: 11/16/2012 °Elsevier Interactive Patient Education ©2016 [employer] ° °Otitis Media, Adult °Otitis media is redness, soreness, and inflammation of the middle ear. Otitis media may be caused by allergies or, most commonly, by infection. Often it occurs as a complication of the common cold. °SIGNS AND SYMPTOMS °Symptoms of otitis media may include: °· Earache. °· Fever. °· Ringing in your ear. °· Headache. °· Leakage of fluid from the ear. °DIAGNOSIS °To diagnose otitis media, your health care provider will examine your ear with an otoscope. This is an instrument that allows your health care provider to see into your ear in order to examine your eardrum. Your health care provider also will ask you questions about your symptoms. °TREATMENT  °Typically, otitis media resolves on its own within 3-5 days. Your health care provider may prescribe medicine to ease your symptoms of pain. If otitis media does not resolve within 5 days or is recurrent, your health care provider may  prescribe antibiotic medicines if he or she suspects that a bacterial infection is the cause. °HOME CARE INSTRUCTIONS  °· If you were prescribed an antibiotic medicine, finish it all even if you start to feel better. °· Take medicines only as directed by your health care provider. °· Keep all follow-up visits as directed by your health care provider. °SEEK MEDICAL CARE IF: °· You have otitis media only in one ear, or bleeding from your nose, or both. °· You notice a lump on your neck. °· You are not getting better in 3-5 days. °· You feel worse instead of better. °SEEK IMMEDIATE MEDICAL CARE IF:  °· You have pain that is not controlled with medicine. °· You have swelling, redness, or pain around your ear or stiffness in your neck. °· You notice that part of your face is paralyzed. °· You notice that the bone behind your ear (mastoid) is tender when you touch it. °MAKE SURE YOU:  °· Understand these instructions. °· Will watch your condition. °· Will get help right away if you are not doing well or get worse. °  °This information is not intended to replace advice given to you by your health care provider. Make sure you discuss any questions you   have with your health care provider. °  °Document Released: 10/30/2003 Document Revised: 02/14/2014 Document Reviewed: 08/21/2012 °Elsevier Interactive Patient Education ©2016 [employer] ° °

## 2015-07-01 NOTE — ED Provider Notes (Signed)
CSN: [id number]     Arrival date & time 07/01/15  0726 History   First MD Initiated Contact with Patient 07/01/15 0740     Chief Complaint  Patient presents with  . Cough      Patient is a 50 y.o. male presenting with cough. The history is provided by the patient.  Cough Associated symptoms: ear pain and shortness of breath   Associated symptoms: no chest pain and no headaches   Patient has pain in the right side of his face. Had for last few days. States he has some trouble breathing with it at times. She's had a cough productive of sputum. No fevers or chills. He's also been off his medications for around 4 months. States he had 2 months medications with his hernia but does not have a doctor has no money. No abdominal pain. No weight loss. Has some nasal congestion. Pain is dull and constant. Has some pain teeth also. No chest pain.  Past Medical History  Diagnosis Date  . Arthritis   . Hypertension    History reviewed. No pertinent past surgical history. History reviewed. No pertinent family history. Social History  Substance Use Topics  . Smoking status: Current Every Day Smoker  . Smokeless tobacco: None  . Alcohol Use: Yes    Review of Systems  Constitutional: Negative for appetite change.  HENT: Positive for dental problem, ear pain and facial swelling. Negative for tinnitus.   Respiratory: Positive for cough and shortness of breath.   Cardiovascular: Negative for chest pain.  Gastrointestinal: Negative for abdominal pain.  Genitourinary: Negative for dysuria.  Musculoskeletal: Negative for back pain.  Skin: Negative for wound.  Neurological: Negative for headaches.  Hematological: Negative for adenopathy.      Allergies  Review of patient's allergies indicates no known allergies.  Home Medications   Prior to Admission medications   Medication Sig Start Date End Date Taking? Authorizing Provider  ibuprofen (ADVIL,MOTRIN) 200 MG tablet Take 400 mg by mouth  every 6 (six) hours as needed for moderate pain.   Yes Historical Provider, MD  amoxicillin (AMOXIL) 500 MG capsule Take 1 capsule (500 mg total) by mouth 3 (three) times daily. 07/01/15   Benjiman Core, MD  lisinopril-hydrochlorothiazide (PRINZIDE,ZESTORETIC) 10-12.5 MG tablet Take 1 tablet by mouth daily. 07/01/15   Benjiman Core, MD  oxyCODONE-acetaminophen (PERCOCET/ROXICET) 5-325 MG tablet Take 1-2 tablets by mouth every 6 (six) hours as needed for severe pain. 07/01/15   Benjiman Core, MD   BP 175/107 mmHg  Pulse 61  Temp(Src) 97.8 F (36.6 C) (Oral)  Resp 18  SpO2 99% Physical Exam  Constitutional: He appears well-developed.  HENT:  Head: Atraumatic.  Right TM erythematous with likely purulent behind it. Bulging. Somewhat poor dentition but no tenderness over teeth or jaw. Posterior pharynx with slight erythema without exudate or swelling.  Eyes: EOM are normal.  Neck: Neck supple. No thyromegaly present.  Cardiovascular: Normal rate.   Pulmonary/Chest:  Mildly harsh breath sounds, worse on right.  Abdominal: Soft. There is no tenderness.  Musculoskeletal: He exhibits no edema.  Neurological: He is alert.  Skin: Skin is warm.    ED Course  Procedures (including critical care time) Labs Review Labs Reviewed  CBC WITH DIFFERENTIAL/PLATELET - Abnormal; Notable for the following:    RBC 2.81 (*)    Hemoglobin 11.8 (*)    HCT 33.9 (*)    MCV 120.6 (*)    MCH 42.0 (*)    All other components within  normal limits  BASIC METABOLIC PANEL - Abnormal; Notable for the following:    Chloride 113 (*)    CO2 21 (*)    Glucose, Bld 100 (*)    All other components within normal limits    Imaging Review Dg Chest 2 View  07/01/2015  CLINICAL DATA:  Congestion for 2-3 days, pain RIGHT-side of face, history asthma, smoking, hypertension EXAM: CHEST  2 VIEW COMPARISON:  03/14/2003 FINDINGS: Enlargement of cardiac silhouette with pulmonary vascular congestion. Mediastinal  contours normal. Slight diffuse interstitial prominence, which may be chronic when accounting for differences in technique. Minimal central peribronchial thickening. No segmental infiltrate, pleural effusion or pneumothorax. Linear atelectasis versus scarring anterior mid lungs noted on lateral view, favor RIGHT middle lobe. Bones unremarkable. IMPRESSION: Enlargement of cardiac silhouette with pulmonary vascular congestion. Bronchitic changes with diffuse interstitial prominence which may be chronic. Subsegmental atelectasis versus scarring likely RIGHT middle lobe. Electronically Signed   By: Ulyses SouthwardMark  Boles M.D.   On: 07/01/2015 08:27   I have personally reviewed and evaluated these images and lab results as part of my medical decision-making.   EKG Interpretation   Date/Time:  Wednesday Jul 01 2015 08:00:23 EDT Ventricular Rate:  61 PR Interval:  179 QRS Duration: 89 QT Interval:  397 QTC Calculation: 400 R Axis:   -12 Text Interpretation:  Sinus rhythm Baseline wander in lead(s) III  Confirmed by Fortino Haag  MD, Denita Lun [phone] on 07/01/2015 8:42:14 AM      MDM   Final diagnoses:  Acute right otitis media, recurrence not specified, unspecified otitis media type  Essential hypertension    Patient with ear and facial pain. Has apparent otitis media. Also medication noncompliance and hypertension. Labs overall reassuring. EKG reassuring. X-ray does not show pneumonia or CHF. Discuss with case management will help arrange follow-up but states they can not get her medicines right now. [facility] Will discharge.  Benjiman CoreNathan Johnnye Sandford, MD 07/01/15 [phone]

## 2015-07-01 NOTE — ED Notes (Addendum)
Pt c/o swelling to right side of face x3 days. Denies injury. Pt also c/o congestion and productive cough. Denies fever, SOB and throat/tongue swelling.

## 2016-12-07 ENCOUNTER — Ambulatory Visit: Payer: Self-pay | Admitting: Internal Medicine

## 2018-10-10 ENCOUNTER — Other Ambulatory Visit: Payer: Self-pay

## 2018-10-10 ENCOUNTER — Emergency Department (HOSPITAL_COMMUNITY)
Admission: EM | Admit: 2018-10-10 | Discharge: 2018-10-10 | Disposition: A | Discharge status: Home / Self Care | Payer: Self-pay | Attending: Emergency Medicine | Admitting: Emergency Medicine

## 2018-10-10 ENCOUNTER — Emergency Department (HOSPITAL_COMMUNITY): Payer: Self-pay

## 2018-10-10 ENCOUNTER — Encounter (HOSPITAL_COMMUNITY): Payer: Self-pay

## 2018-10-10 DIAGNOSIS — I10 Essential (primary) hypertension: Secondary | ICD-10-CM | POA: Insufficient documentation

## 2018-10-10 DIAGNOSIS — F17210 Nicotine dependence, cigarettes, uncomplicated: Secondary | ICD-10-CM | POA: Insufficient documentation

## 2018-10-10 DIAGNOSIS — M79645 Pain in left finger(s): Secondary | ICD-10-CM | POA: Insufficient documentation

## 2018-10-10 IMAGING — DX DG FINGER THUMB 2+V*L*
3 series · 3 of 3 positions shown · non-contrast
Comparison: None.

CLINICAL DATA: Thumb pain

EXAM:
LEFT THUMB 2+V

[x finger pa left]
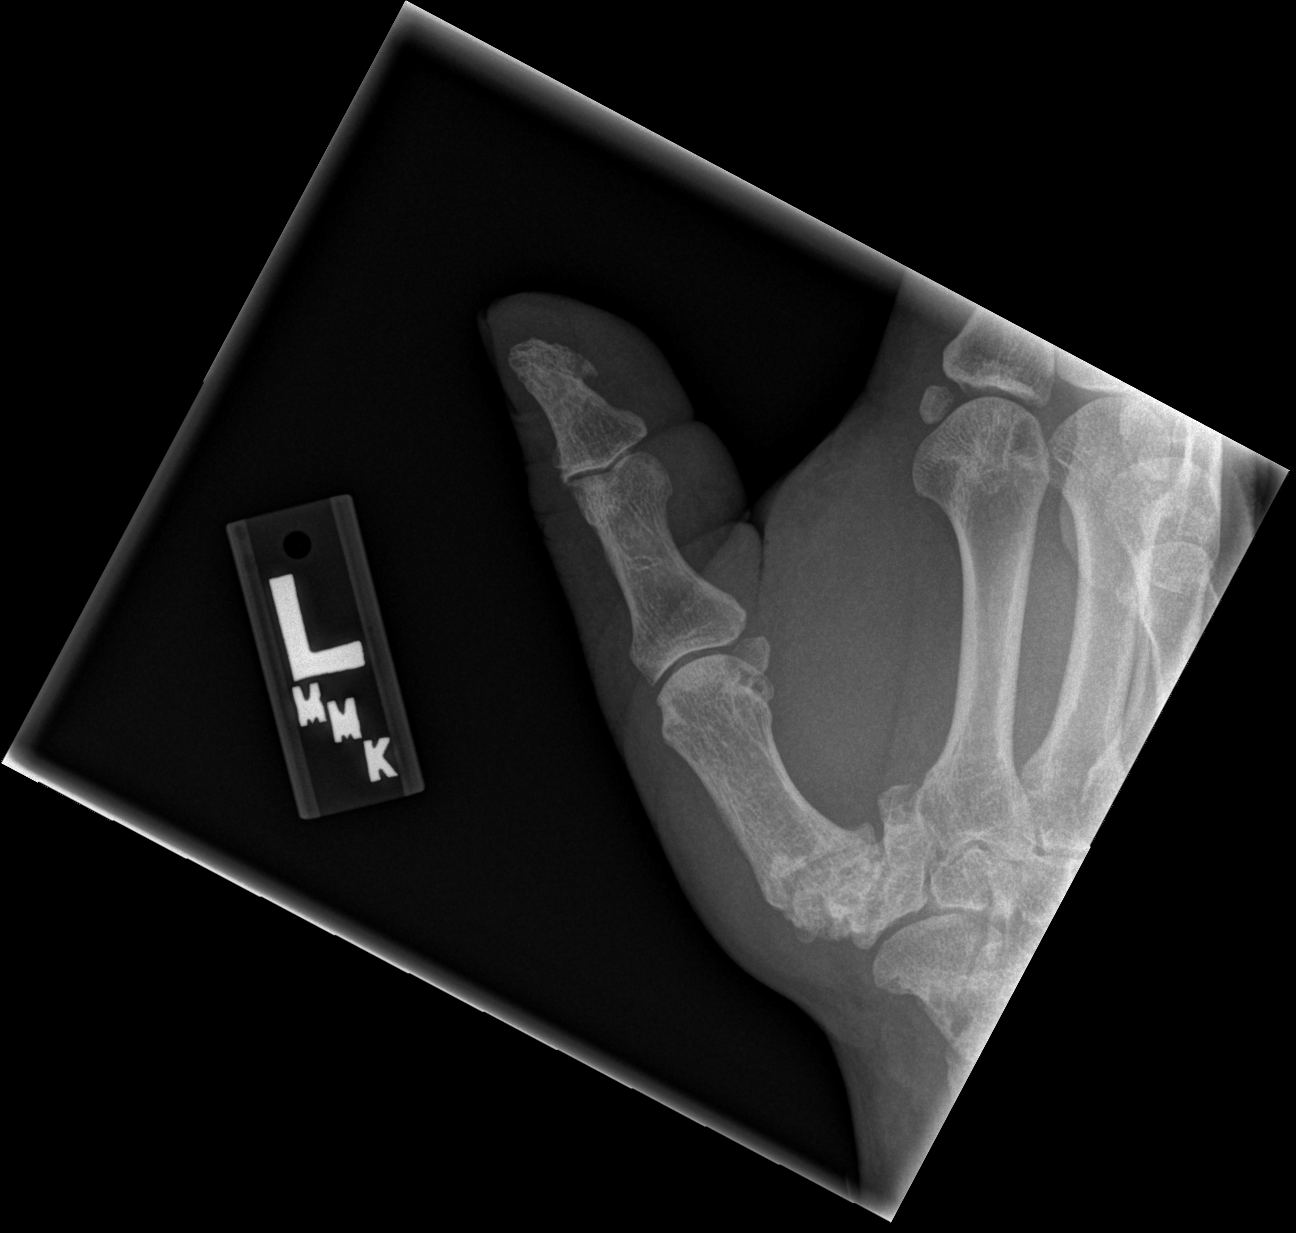

[x finger obl left]
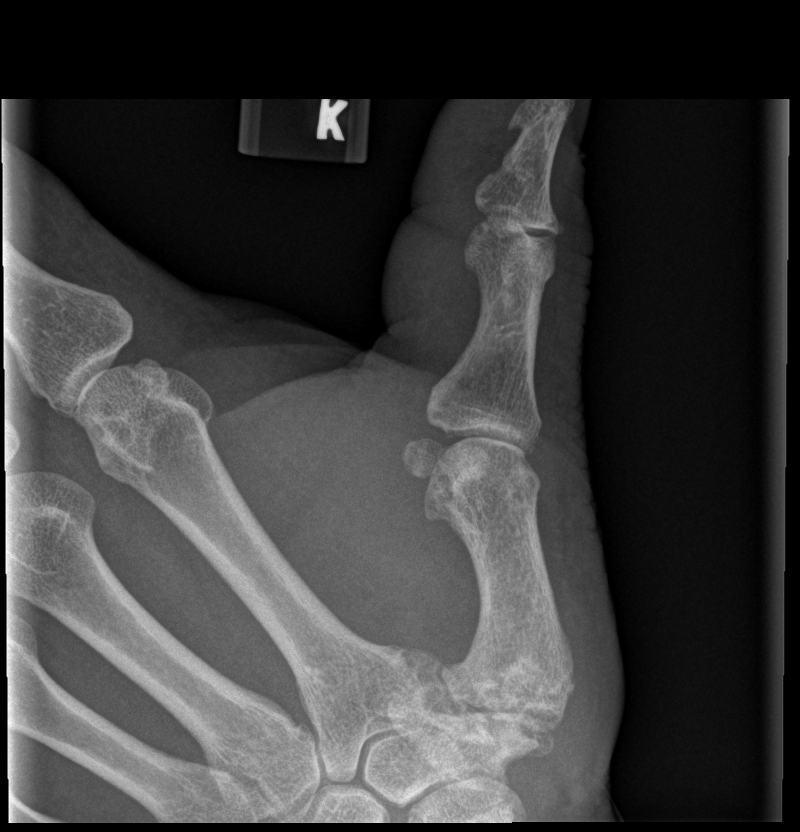

[x finger lat left]
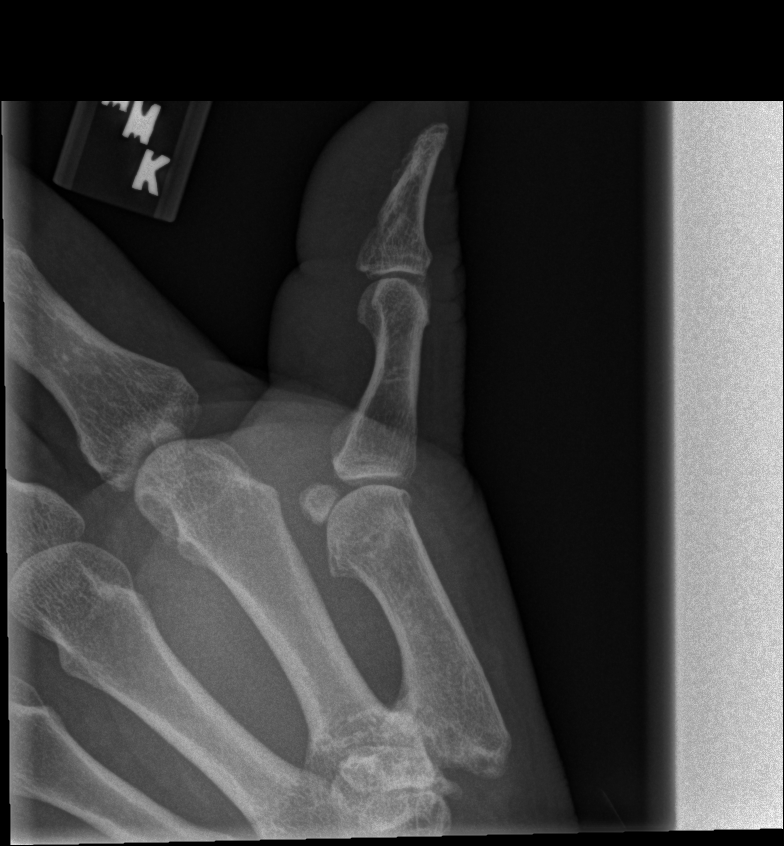

[3 of 3 positions shown; findings below may reference images not displayed]

FINDINGS: There is no acute displaced fracture or dislocation. There are
advanced degenerative changes of the first carpometacarpal joint.
There may be some degenerative subluxation at the first CMC joint,
however this is only appreciated on a single view and may simply be
artifactual in etiology.
IMPRESSION: 1. No acute osseous abnormality.
2. Advanced degenerative changes of the first carpometacarpal joint.

## 2018-10-10 MED ORDER — ACETAMINOPHEN 325 MG PO TABS: 650.0000 mg | ORAL_TABLET | Freq: Four times a day (QID) | ORAL | 0 refills | Status: DC | PRN

## 2018-10-10 NOTE — ED Triage Notes (Signed)
Pt stated that he woke up about 2 weeks ago & his Lt thumb joint is hard for him to move & since then he was not been able to move it without the joint popping in & out (per pt). Pain rated 10/10, Lt pinky finger feels numb on that same hand.

## 2018-10-10 NOTE — Discharge Instructions (Signed)
Please read attached information. If you experience any new or worsening signs or symptoms please return to the emergency room for evaluation. Please follow-up with your primary care provider or specialist as discussed. Please use medication prescribed only as directed and discontinue taking if you have any concerning signs or symptoms.   °

## 2018-10-10 NOTE — ED Provider Notes (Signed)
[facility] Provider Note   CSN: [id number] Arrival date & time: 10/10/18  1328     History   Chief Complaint No chief complaint on file.   HPI Logan Nelson is a 53 y.o. male.     HPI   53 year old male presents today with complaints of left thumb pain.  Patient notes that he feels as if his left thumb is popping in and out of socket.  Patient notes some pain at the metacarpal phalangeal joint.  With some minor swelling.  No loss of sensation, no trauma.  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    Past Medical History:  Diagnosis Date  . Arthritis   . Hypertension     Patient Active Problem List   Diagnosis Date Noted  . Hypertension 01/09/2015  . Incisional hernia 01/07/2015    History reviewed. No pertinent surgical history.      Home Medications    Prior to Admission medications   Medication Sig Start Date End Date Taking? Authorizing Provider  acetaminophen (TYLENOL) 325 MG tablet Take 2 tablets (650 mg total) by mouth every 6 (six) hours as needed. 10/10/18   Seniah Lawrence, Dellis Filbert, PA-C  amoxicillin (AMOXIL) 500 MG capsule Take 1 capsule (500 mg total) by mouth 3 (three) times daily. 07/01/15   Davonna Belling, MD  ibuprofen (ADVIL,MOTRIN) 200 MG tablet Take 400 mg by mouth every 6 (six) hours as needed for moderate pain.    [provider]  lisinopril-hydrochlorothiazide (PRINZIDE,ZESTORETIC) 10-12.5 MG tablet Take 1 tablet by mouth daily. 07/01/15   Davonna Belling, MD  oxyCODONE-acetaminophen (PERCOCET/ROXICET) 5-325 MG tablet Take 1-2 tablets by mouth every 6 (six) hours as needed for severe pain. 07/01/15   Davonna Belling, MD    Family History History reviewed. No pertinent family history.  Social History Social History   Tobacco Use  . Smoking status: Current Every Day Smoker  Substance Use Topics  . Alcohol use: Yes  . Drug use: Not on file     Allergies   Patient has no known allergies.   Review of Systems Review of Systems  All other systems reviewed and are negative.    Physical Exam Updated Vital Signs BP (!) 142/95 (BP Location: Right Arm)   Pulse (!) 58   Temp 98.4 F (36.9 C) (Oral)   Ht 5' 6.5" (1.689 m)   Wt 121.1 kg   SpO2 98%   BMI 42.45 kg/m   Physical Exam Vitals signs and nursing note reviewed.  Constitutional:      Appearance: He is well-developed.  HENT:     Head: Normocephalic and atraumatic.  Eyes:     General: No scleral icterus.       Right eye: No discharge.        Left eye: No discharge.     Conjunctiva/sclera: Conjunctivae normal.     Pupils: Pupils are equal, round, and reactive to light.  Neck:     Musculoskeletal: Normal range of motion.     Vascular: No JVD.     Trachea: No tracheal deviation.  Pulmonary:     Effort: Pulmonary effort is normal.     Breath sounds: No stridor.  Musculoskeletal:     Comments: Minor swelling noted at the metacarpophalangeal joint of the left thumb, no laxity noted no warmth or redness sensation intact limited flexion extension secondary to discomfort  Neurological:     Mental Status: He is alert and oriented to person, place, and  time.     Coordination: Coordination normal.  Psychiatric:        Behavior: Behavior normal.        Thought Content: Thought content normal.        Judgment: Judgment normal.      ED Treatments / Results  Labs (all labs ordered are listed, but only abnormal results are displayed) Labs Reviewed - No data to display  EKG None  Radiology Dg Finger Thumb Left  Result Date: 10/10/2018 CLINICAL DATA:  Thumb pain EXAM: LEFT THUMB 2+V COMPARISON:  None. FINDINGS: There is no acute displaced fracture or dislocation. There are advanced degenerative changes of the first carpometacarpal joint. There may be some degenerative subluxation at the first [facility] joint, however this is only appreciated on a single view and may simply be artifactual in etiology. IMPRESSION: 1. No  acute osseous abnormality. 2. 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 Electronically Signed   By: Katherine Mantlehristopher  Green M.D.   On: 10/10/2018 15:41    Procedures Procedures (including critical care time)  Medications Ordered in ED Medications - No data to display   Initial Impression / Assessment and Plan / ED Course  I have reviewed the triage vital signs and the nursing notes.  Pertinent labs & imaging results that were available during my care of the patient were reviewed by me and considered in my medical decision making (see chart for details).        53 year old male presents today with thumb pain.  He has no signs of infectious etiology, no signs of dislocation.  He does have significant arthritis in his hand.  Patient placed in a thumb spica encouraged to rest, anti-inflammatories, return if he develops any new or worsening signs or symptoms.  He verbalized understanding and agreement to today's plan.  Final Clinical Impressions(s) / ED Diagnoses   Final diagnoses:  Pain of left thumb    ED Discharge Orders         Ordered    acetaminophen (TYLENOL) 325 MG tablet  Every 6 hours PRN     10/10/18 1628           Eyvonne MechanicHedges, Alexx Mcburney, PA-C 10/10/18 1718    Melene PlanFloyd, Dan, DO 10/10/18 2049

## 2019-01-23 ENCOUNTER — Emergency Department (HOSPITAL_COMMUNITY): Payer: Self-pay

## 2019-01-23 ENCOUNTER — Encounter (HOSPITAL_COMMUNITY): Payer: Self-pay

## 2019-01-23 ENCOUNTER — Emergency Department (HOSPITAL_COMMUNITY)
Admission: EM | Admit: 2019-01-23 | Discharge: 2019-01-23 | Disposition: A | Discharge status: Home / Self Care | Payer: Self-pay | Attending: Emergency Medicine | Admitting: Emergency Medicine

## 2019-01-23 ENCOUNTER — Other Ambulatory Visit: Payer: Self-pay

## 2019-01-23 DIAGNOSIS — R05 Cough: Secondary | ICD-10-CM | POA: Insufficient documentation

## 2019-01-23 DIAGNOSIS — Z87891 Personal history of nicotine dependence: Secondary | ICD-10-CM | POA: Insufficient documentation

## 2019-01-23 DIAGNOSIS — R0789 Other chest pain: Secondary | ICD-10-CM | POA: Insufficient documentation

## 2019-01-23 DIAGNOSIS — K439 Ventral hernia without obstruction or gangrene: Secondary | ICD-10-CM | POA: Insufficient documentation

## 2019-01-23 DIAGNOSIS — Z20828 Contact with and (suspected) exposure to other viral communicable diseases: Secondary | ICD-10-CM | POA: Insufficient documentation

## 2019-01-23 DIAGNOSIS — Z79899 Other long term (current) drug therapy: Secondary | ICD-10-CM | POA: Insufficient documentation

## 2019-01-23 DIAGNOSIS — I10 Essential (primary) hypertension: Secondary | ICD-10-CM | POA: Insufficient documentation

## 2019-01-23 HISTORY — DX: Ventral hernia without obstruction or gangrene: K43.9

## 2019-01-23 LAB — BASIC METABOLIC PANEL
Anion gap: 7 (ref 5–15)
BUN: 12 mg/dL (ref 6–20)
CO2: 25 mmol/L (ref 22–32)
Calcium: 9 mg/dL (ref 8.9–10.3)
Chloride: 106 mmol/L (ref 98–111)
Creatinine, Ser: 0.85 mg/dL (ref 0.61–1.24)
GFR calc Af Amer: >60 mL/min (ref >60)
GFR calc non Af Amer: >60 mL/min (ref >60)
Glucose, Bld: 84 mg/dL (ref 70–99)
Potassium: 4.2 mmol/L (ref 3.5–5.1)
Sodium: 138 mmol/L (ref 135–145)

## 2019-01-23 LAB — CBC
HCT: 47.3 % (ref 39.0–52.0)
Hemoglobin: 15.2 g/dL (ref 13.0–17.0)
MCH: 30.9 pg (ref 26.0–34.0)
MCHC: 32.1 g/dL (ref 30.0–36.0)
MCV: 96.1 fL (ref 80.0–100.0)
Platelets: 284 10*3/uL (ref 150–400)
RBC: 4.92 MIL/uL (ref 4.22–5.81)
RDW: 13.5 % (ref 11.5–15.5)
WBC: 5.1 10*3/uL (ref 4.0–10.5)
nRBC: 0 % (ref 0.0–0.2)

## 2019-01-23 LAB — TROPONIN I (HIGH SENSITIVITY): Troponin I (High Sensitivity): 9 ng/L (ref <18)

## 2019-01-23 LAB — POC SARS CORONAVIRUS 2 AG -  ED: SARS Coronavirus 2 Ag: NEGATIVE

## 2019-01-23 IMAGING — CR DG CHEST 2V
2 series · 2 of 2 positions shown · non-contrast
Comparison: [DATE].

CLINICAL DATA: Chest pain, productive cough.

EXAM:
CHEST - 2 VIEW

[w chest pa]
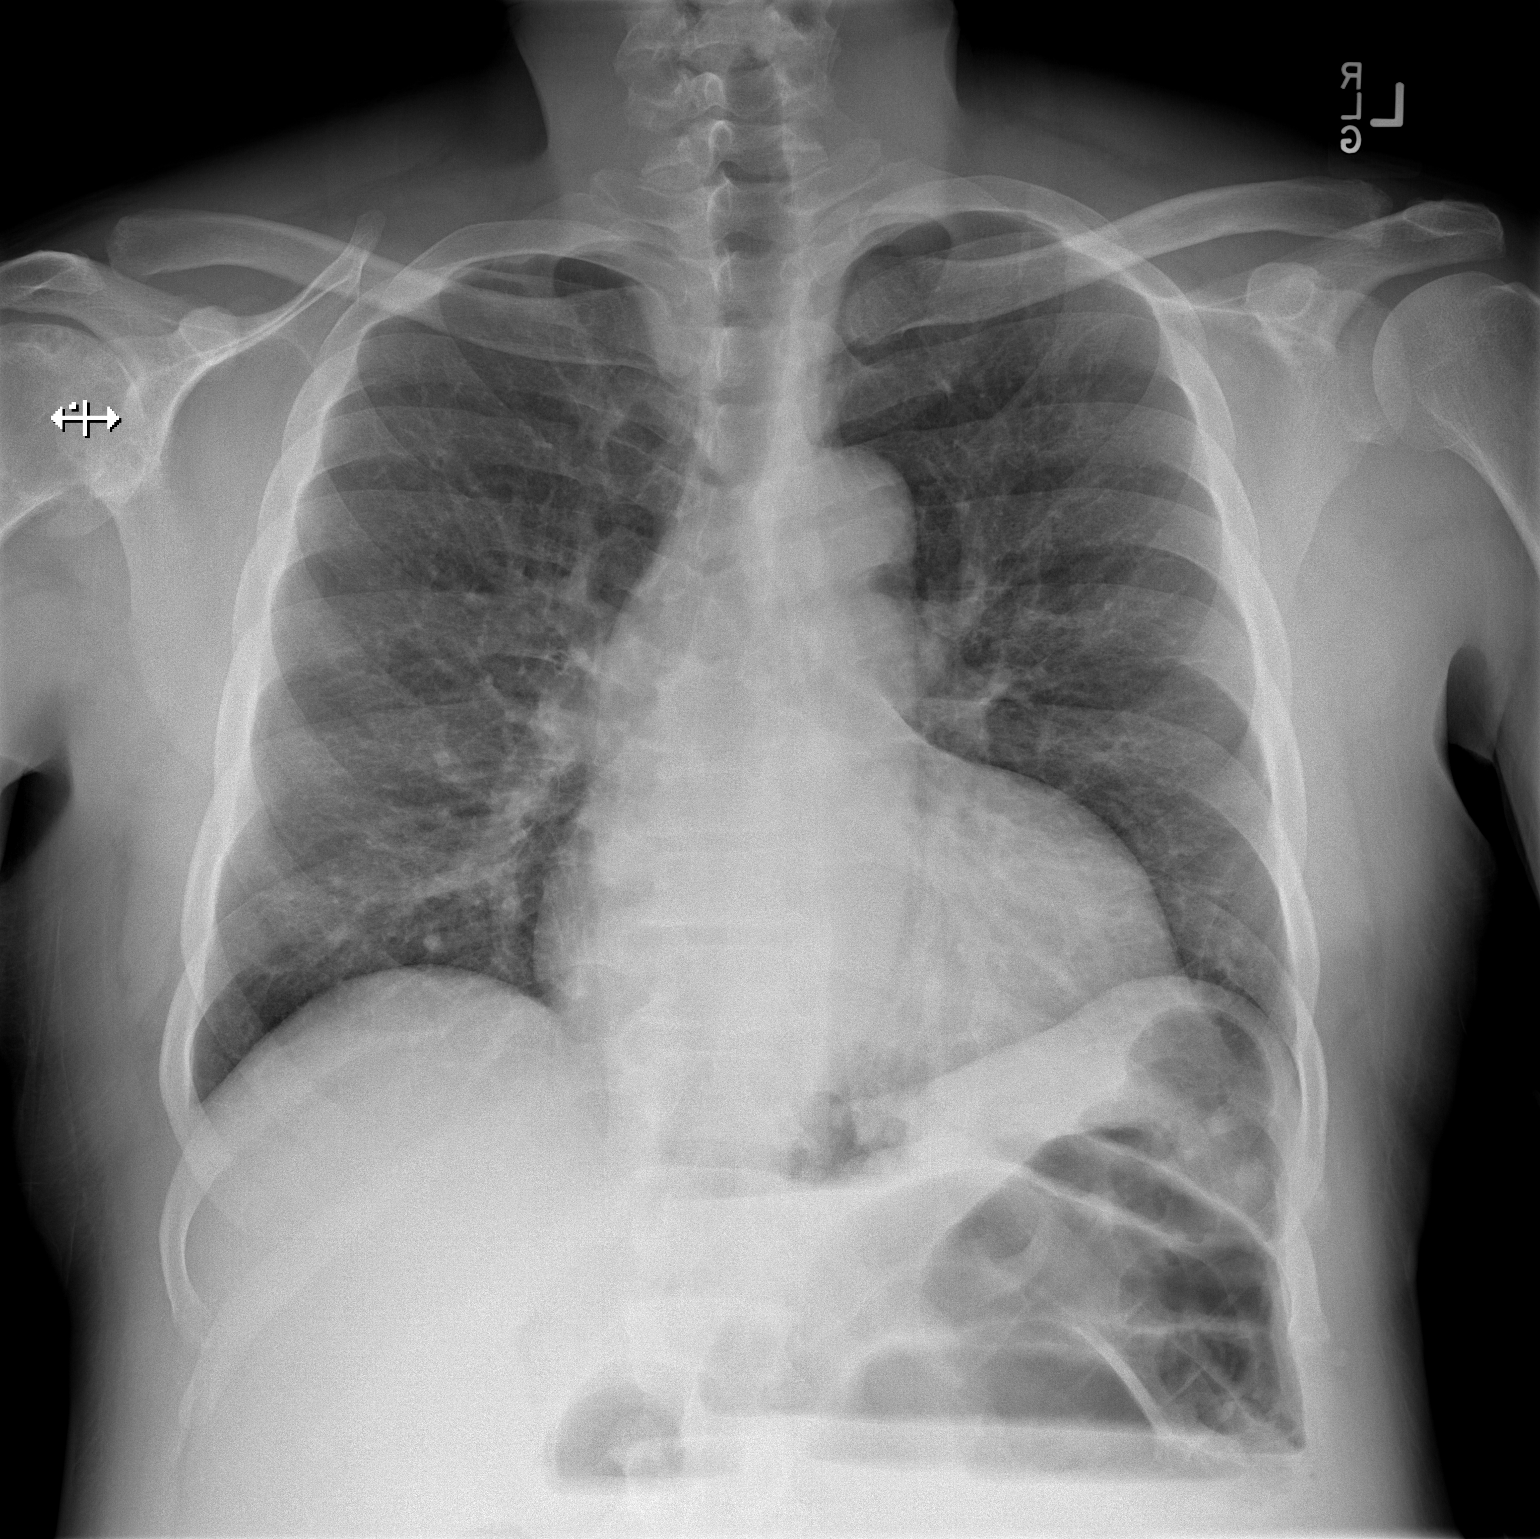

[w chest lat]
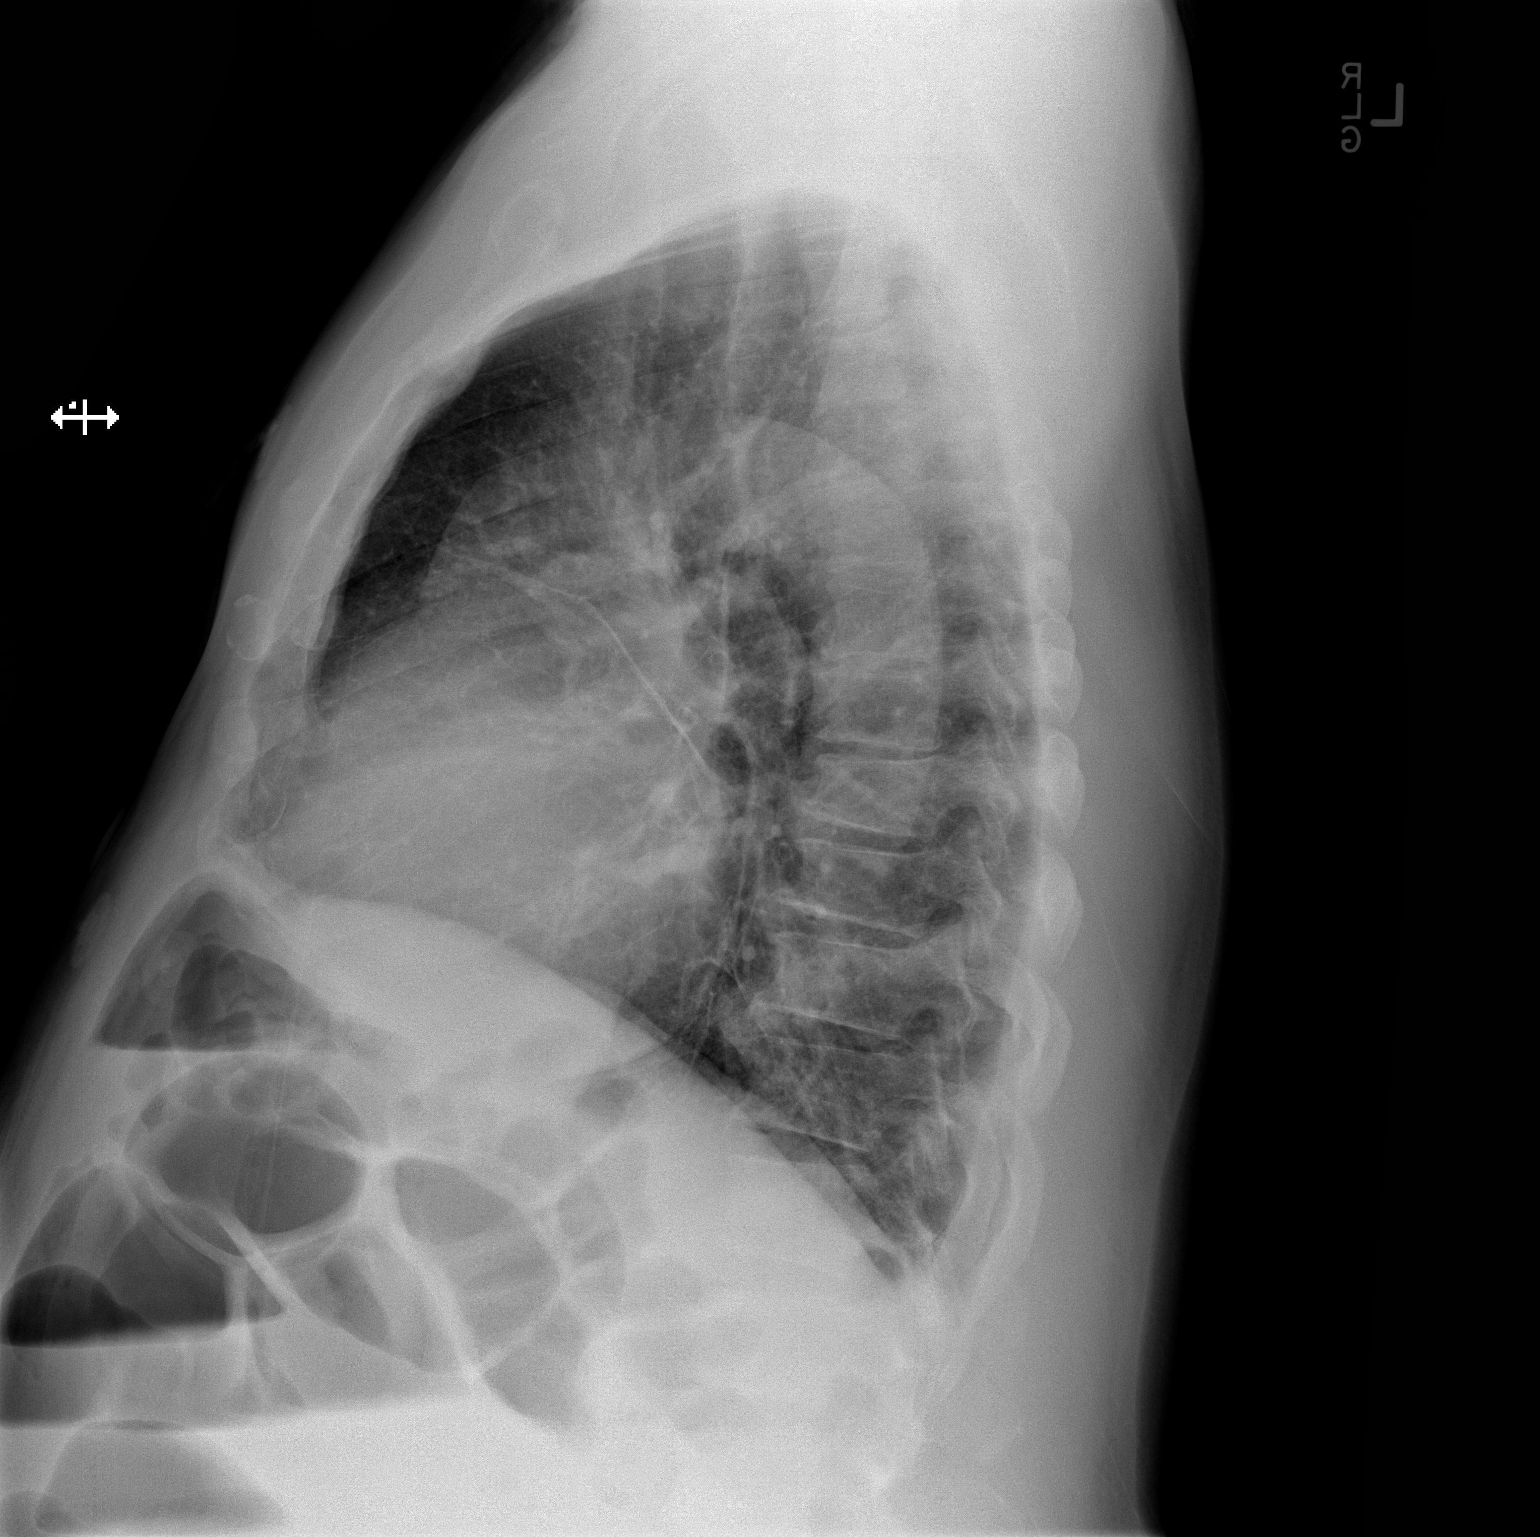

[2 of 2 positions shown; findings below may reference images not displayed]

FINDINGS: The heart size and mediastinal contours are within normal limits.
Both lungs are clear. No pneumothorax or pleural effusion is noted.
The visualized skeletal structures are unremarkable.
IMPRESSION: No active cardiopulmonary disease.

## 2019-01-23 MED ORDER — SODIUM CHLORIDE 0.9% FLUSH: 3.0000 mL | Freq: Once | INTRAVENOUS | Status: DC

## 2019-01-23 NOTE — ED Provider Notes (Signed)
[facility]-EMERGENCY DEPT Provider Note   CSN: [id number] Arrival date & time: 01/23/19  1318     History Chief Complaint  Patient presents with  . Chest Pain  . Cough    Logan Nelson is a 53 y.o. male.  HPI    53 year old male with chest pain.  Intermittent pain in the center of his chest over the past 2 to 3 days.  Typically has some pain with coughing but sometimes when not.  Pain is sometimes sharp and sometimes dull ache.  Cough is productive for yellow sputum.  Does not feel short of breath though.  No fevers or chills.  Patient reports intermittent vomiting but this is been ongoing since colostomy reversal a while ago.  Does not feel like it is acutely worse.  No acute abdominal pain.  No diarrhea.  He reports that his daughter had a "cold" about a month ago.  No sick contacts otherwise. No unusual leg pain or swelling.   Past Medical History:  Diagnosis Date  . Arthritis   . Hernia of abdominal wall   . Hypertension     Patient Active Problem List   Diagnosis Date Noted  . Hypertension 01/09/2015  . Incisional hernia 01/07/2015    Past Surgical History:  Procedure Laterality Date  . ABDOMINAL SURGERY        History reviewed. No pertinent family history.  Social History   Tobacco Use  . Smoking status: Former Smoker    Types: Cigarettes  . Smokeless tobacco: Never Used  Substance Use Topics  . Alcohol use: Yes  . Drug use: Not on file    Home Medications Prior to Admission medications   Medication Sig Start Date End Date Taking? Authorizing Provider  acetaminophen (TYLENOL) 325 MG tablet Take 2 tablets (650 mg total) by mouth every 6 (six) hours as needed. 10/10/18   Hedges, Tinnie Gens, PA-C  amoxicillin (AMOXIL) 500 MG capsule Take 1 capsule (500 mg total) by mouth 3 (three) times daily. 07/01/15   Benjiman Core, MD  ibuprofen (ADVIL,MOTRIN) 200 MG tablet Take 400 mg by mouth every 6 (six) hours as needed for moderate pain.     [provider]  lisinopril-hydrochlorothiazide (PRINZIDE,ZESTORETIC) 10-12.5 MG tablet Take 1 tablet by mouth daily. 07/01/15   Benjiman Core, MD  oxyCODONE-acetaminophen (PERCOCET/ROXICET) 5-325 MG tablet Take 1-2 tablets by mouth every 6 (six) hours as needed for severe pain. 07/01/15   Benjiman Core, MD    Allergies    Patient has no known allergies.  Review of Systems   Review of Systems All systems reviewed and negative, other than as noted in HPI.  Physical Exam Updated Vital Signs BP (!) 189/106   Pulse (!) 41   Temp 98 F (36.7 C) (Oral)   Resp 11   Ht 5' 6.5" (1.689 m)   Wt 91.4 kg   SpO2 98%   BMI 32.04 kg/m   Physical Exam Vitals and nursing note reviewed.  Constitutional:      General: He is not in acute distress.    Appearance: He is well-developed.  HENT:     Head: Normocephalic and atraumatic.  Eyes:     General:        Right eye: No discharge.        Left eye: No discharge.     Conjunctiva/sclera: Conjunctivae normal.  Cardiovascular:     Rate and Rhythm: Normal rate and regular rhythm.     Heart sounds: Normal heart sounds.  No murmur. No friction rub. No gallop.   Pulmonary:     Effort: Pulmonary effort is normal. No respiratory distress.     Breath sounds: Normal breath sounds.  Abdominal:     General: There is no distension.     Palpations: Abdomen is soft.     Tenderness: There is no abdominal tenderness.     Hernia: A hernia is present.     Comments: Large easily reducible ventral hernia.  Musculoskeletal:        General: No tenderness.     Cervical back: Neck supple.     Comments: Lower extremities symmetric as compared to each other. No calf tenderness. Negative Homan's. No palpable cords.   Skin:    General: Skin is warm and dry.  Neurological:     Mental Status: He is alert.  Psychiatric:        Behavior: Behavior normal.        Thought Content: Thought content normal.     ED Results / Procedures / Treatments    Labs (all labs ordered are listed, but only abnormal results are displayed) Labs Reviewed  BASIC METABOLIC PANEL  CBC  POC SARS CORONAVIRUS 2 AG -  ED  TROPONIN I (HIGH SENSITIVITY)  TROPONIN I (HIGH SENSITIVITY)    EKG EKG Interpretation  Date/Time:  Wednesday January 23 2019 13:27:34 EST Ventricular Rate:  58 PR Interval:  176 QRS Duration: 110 QT Interval:  414 QTC Calculation: 406 R Axis:   -55 Text Interpretation: Sinus bradycardia Left anterior fascicular block Anterior infarct , age undetermined Abnormal ECG Confirmed by Virgel Manifold [phone]) on 01/23/2019 3:31:39 PM   Radiology DG Chest 2 View  Result Date: 01/23/2019 CLINICAL DATA:  Chest pain, productive cough. EXAM: CHEST - 2 VIEW COMPARISON:  Jul 01, 2015. FINDINGS: The heart size and mediastinal contours are within normal limits. Both lungs are clear. No pneumothorax or pleural effusion is noted. The visualized skeletal structures are unremarkable. IMPRESSION: No active cardiopulmonary disease. Electronically Signed   By: Marijo Conception M.D.   On: 01/23/2019 13:50    Procedures Procedures (including critical care time)  Medications Ordered in ED Medications  sodium chloride flush (NS) 0.9 % injection 3 mL (0 mLs Intravenous Hold 01/23/19 1453)    ED Course  I have reviewed the triage vital signs and the nursing notes.  Pertinent labs & imaging results that were available during my care of the patient were reviewed by me and considered in my medical decision making (see chart for details).    MDM Rules/Calculators/A&P   53yM with CP. Atypical for ACS. Doubt PE, dissection or other emergent process. Has been having intermittent vomiting for months. Ventral hernia reduces easily. Abdomen otherwise benign. Needs outpt surgery FU.   Final Clinical Impression(s) / ED Diagnoses Final diagnoses:  Atypical chest pain  Ventral hernia without obstruction or gangrene    Rx / DC Orders ED Discharge Orders     None       Virgel Manifold, MD 01/24/19 1948

## 2019-01-23 NOTE — ED Triage Notes (Addendum)
Patient c/o intermittent mid chest pain and a productive cough with yellow/white sputum x 3 days Patient states he hs been been vomiting but states he has vomiting since having hernia repair.

## 2019-07-10 ENCOUNTER — Emergency Department (HOSPITAL_COMMUNITY): Payer: Self-pay

## 2019-07-10 ENCOUNTER — Encounter (HOSPITAL_COMMUNITY): Payer: Self-pay | Admitting: Emergency Medicine

## 2019-07-10 ENCOUNTER — Emergency Department (HOSPITAL_COMMUNITY)
Admission: EM | Admit: 2019-07-10 | Discharge: 2019-07-10 | Disposition: A | Discharge status: Home / Self Care | Payer: Self-pay | Attending: Emergency Medicine | Admitting: Emergency Medicine

## 2019-07-10 DIAGNOSIS — Y929 Unspecified place or not applicable: Secondary | ICD-10-CM | POA: Insufficient documentation

## 2019-07-10 DIAGNOSIS — X501XXA Overexertion from prolonged static or awkward postures, initial encounter: Secondary | ICD-10-CM | POA: Insufficient documentation

## 2019-07-10 DIAGNOSIS — M19011 Primary osteoarthritis, right shoulder: Secondary | ICD-10-CM | POA: Insufficient documentation

## 2019-07-10 DIAGNOSIS — S46911A Strain of unspecified muscle, fascia and tendon at shoulder and upper arm level, right arm, initial encounter: Secondary | ICD-10-CM | POA: Insufficient documentation

## 2019-07-10 DIAGNOSIS — Y9389 Activity, other specified: Secondary | ICD-10-CM | POA: Insufficient documentation

## 2019-07-10 DIAGNOSIS — I10 Essential (primary) hypertension: Secondary | ICD-10-CM | POA: Insufficient documentation

## 2019-07-10 DIAGNOSIS — Z87891 Personal history of nicotine dependence: Secondary | ICD-10-CM | POA: Insufficient documentation

## 2019-07-10 DIAGNOSIS — Y998 Other external cause status: Secondary | ICD-10-CM | POA: Insufficient documentation

## 2019-07-10 DIAGNOSIS — R079 Chest pain, unspecified: Secondary | ICD-10-CM

## 2019-07-10 IMAGING — DX DG CHEST 1V
1 series · 1 of 1 positions shown · non-contrast
Comparison: Prior chest radiographs [DATE] and earlier

CLINICAL DATA: Chest pain. Additional provided: Patient reports
right shoulder pain onset this morning when waking, pain with
movement and palpation.

EXAM:
CHEST  1 VIEW

[chest pa]
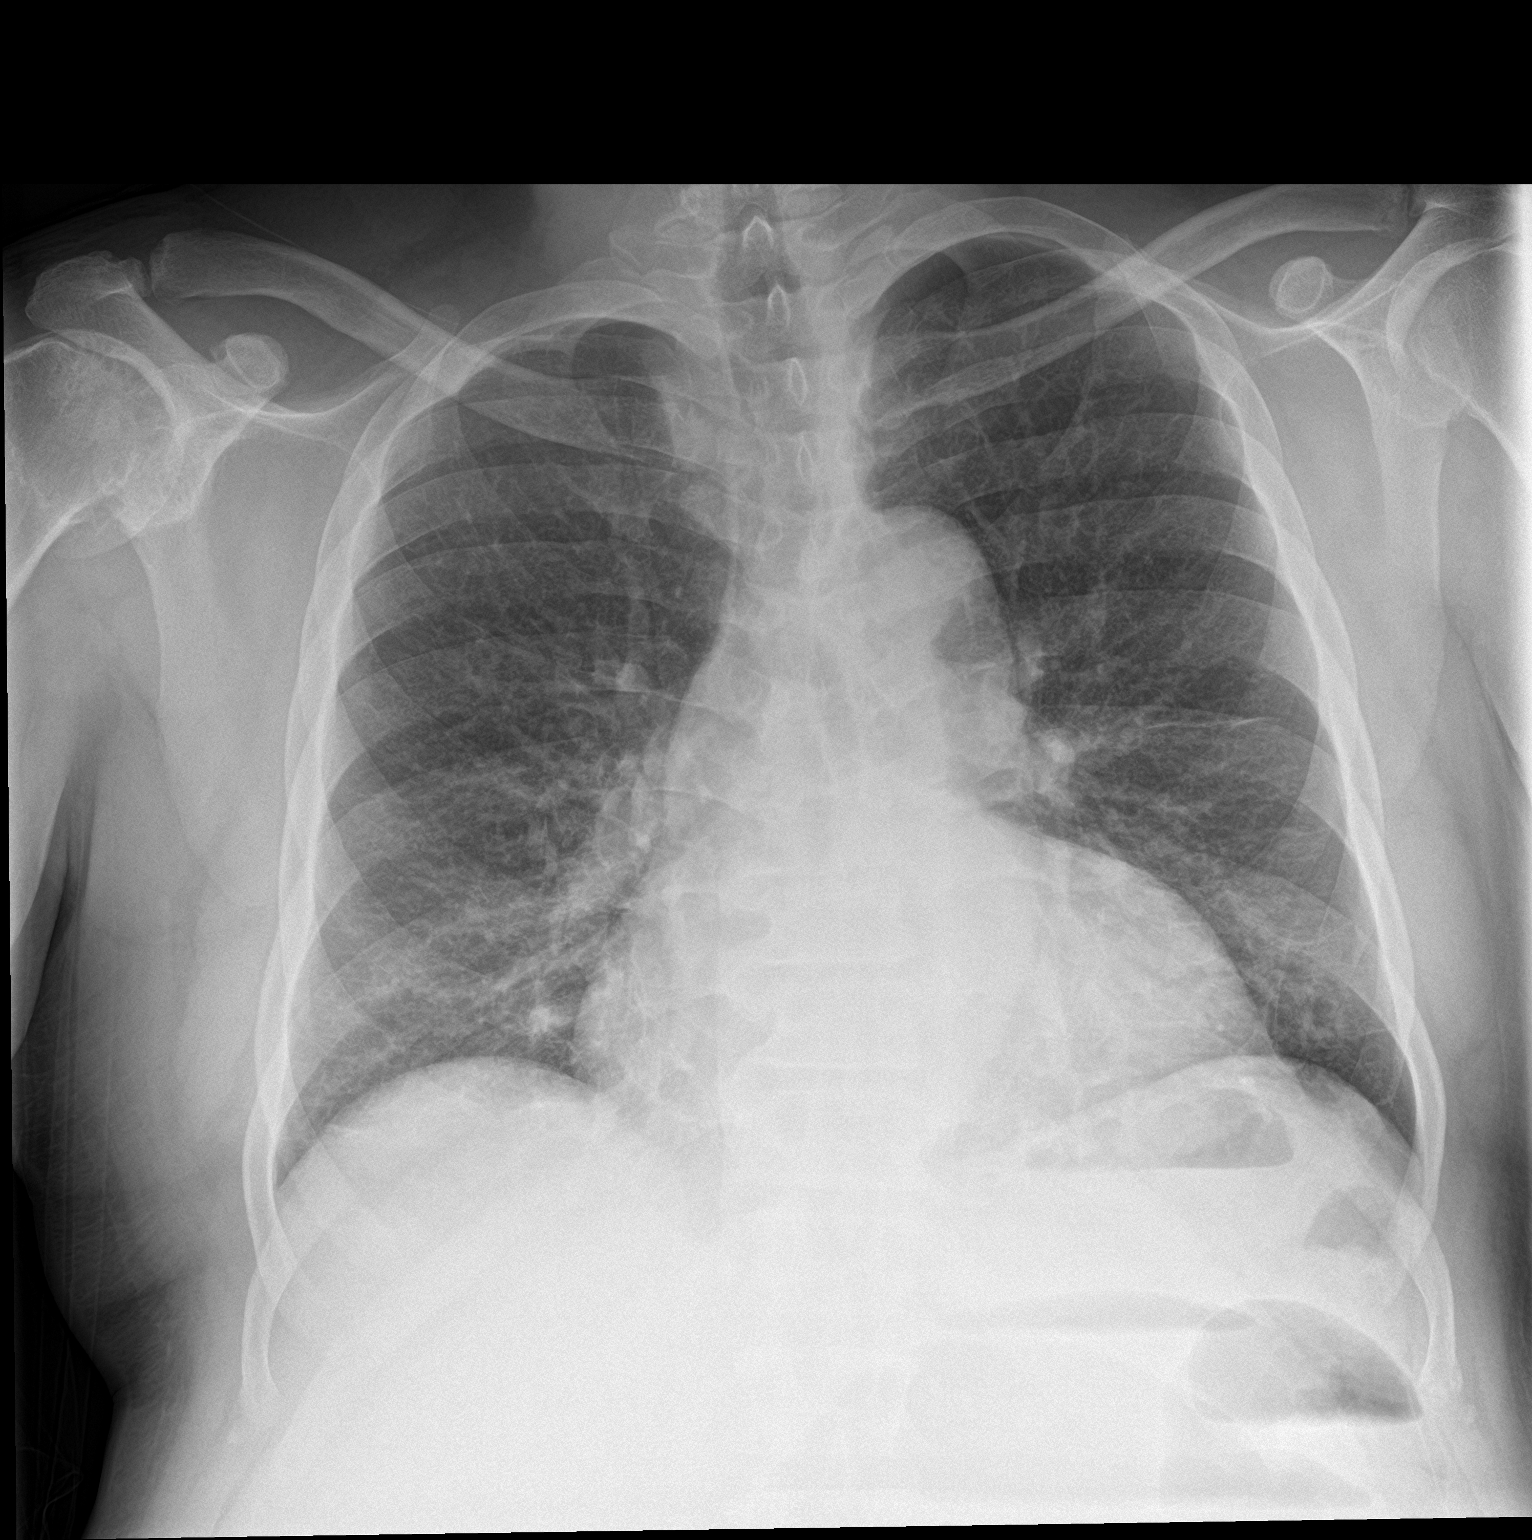

[1 of 1 positions shown; findings below may reference images not displayed]

FINDINGS: Heart size within normal limits. Ill-defined opacities within the
bilateral lung bases which may reflect atelectasis and/or scarring.
Pneumonia cannot be excluded. No evidence of pleural effusion or
pneumothorax. No acute bony abnormality is identified. Degenerative
changes of the right glenohumeral and bilateral acromioclavicular
joints.
IMPRESSION: Ill-defined opacities within the bilateral lung bases, which may
reflect atelectasis and/or scarring. Pneumonia cannot be excluded.

Degenerative changes of the right glenohumeral and bilateral
acromioclavicular joints.

## 2019-07-10 IMAGING — DX DG SHOULDER 2+V*R*
4 series · 4 of 4 positions shown · non-contrast
Comparison: None.

CLINICAL DATA: Pain

EXAM:
RIGHT SHOULDER - 2+ VIEW

[shoulder grashey]
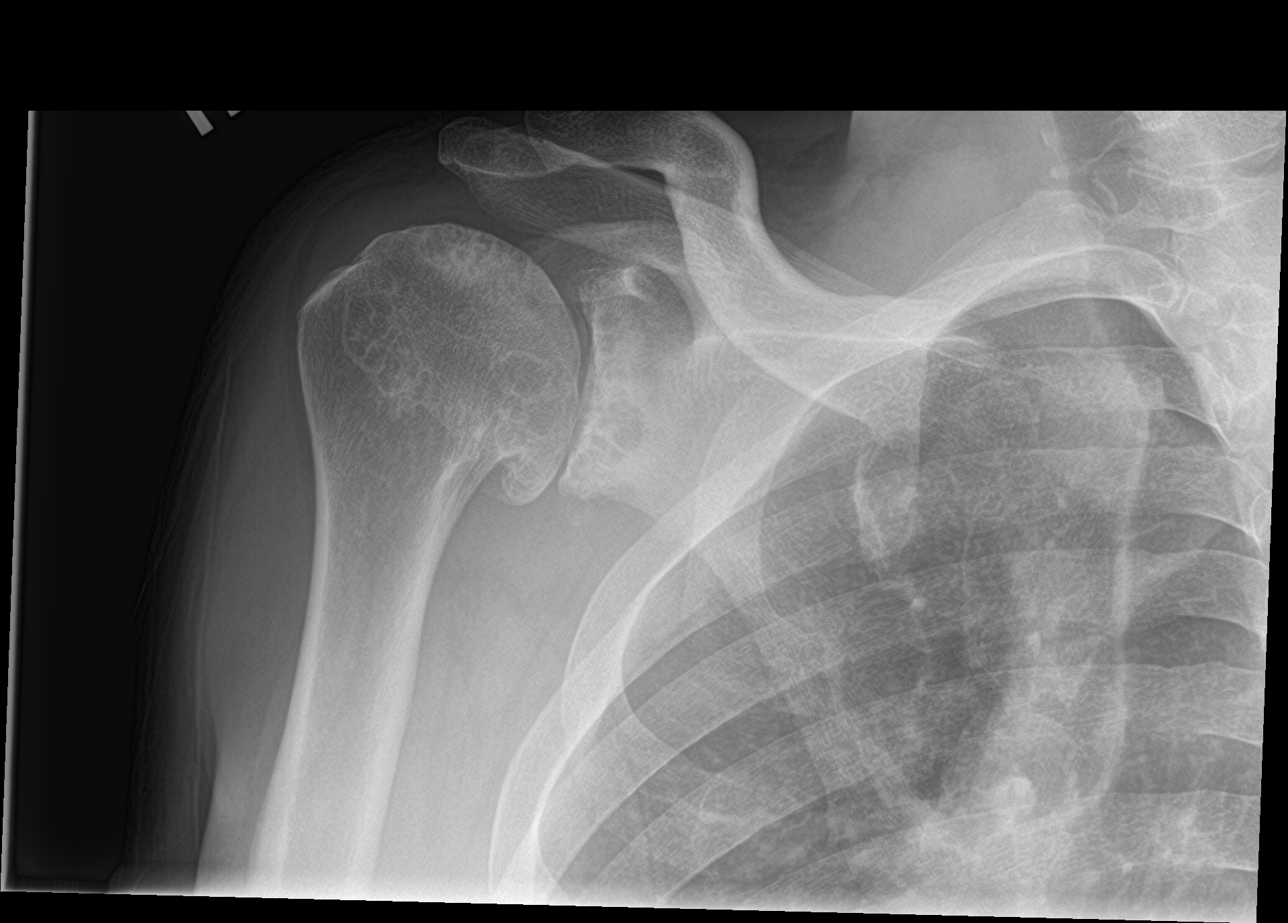

[shoulder y view]
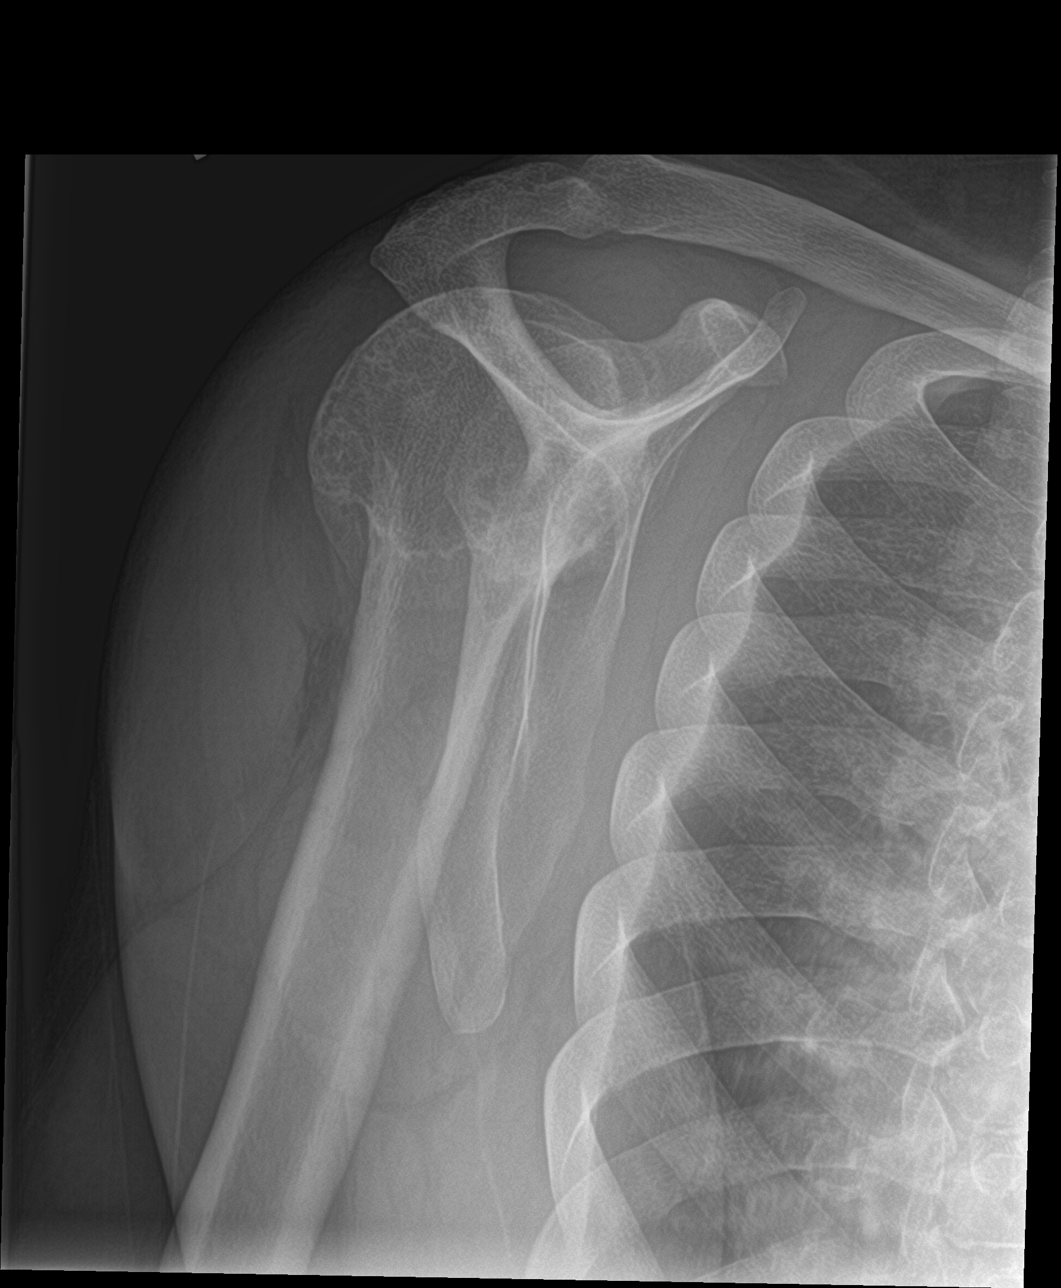

[shoulder axillary]
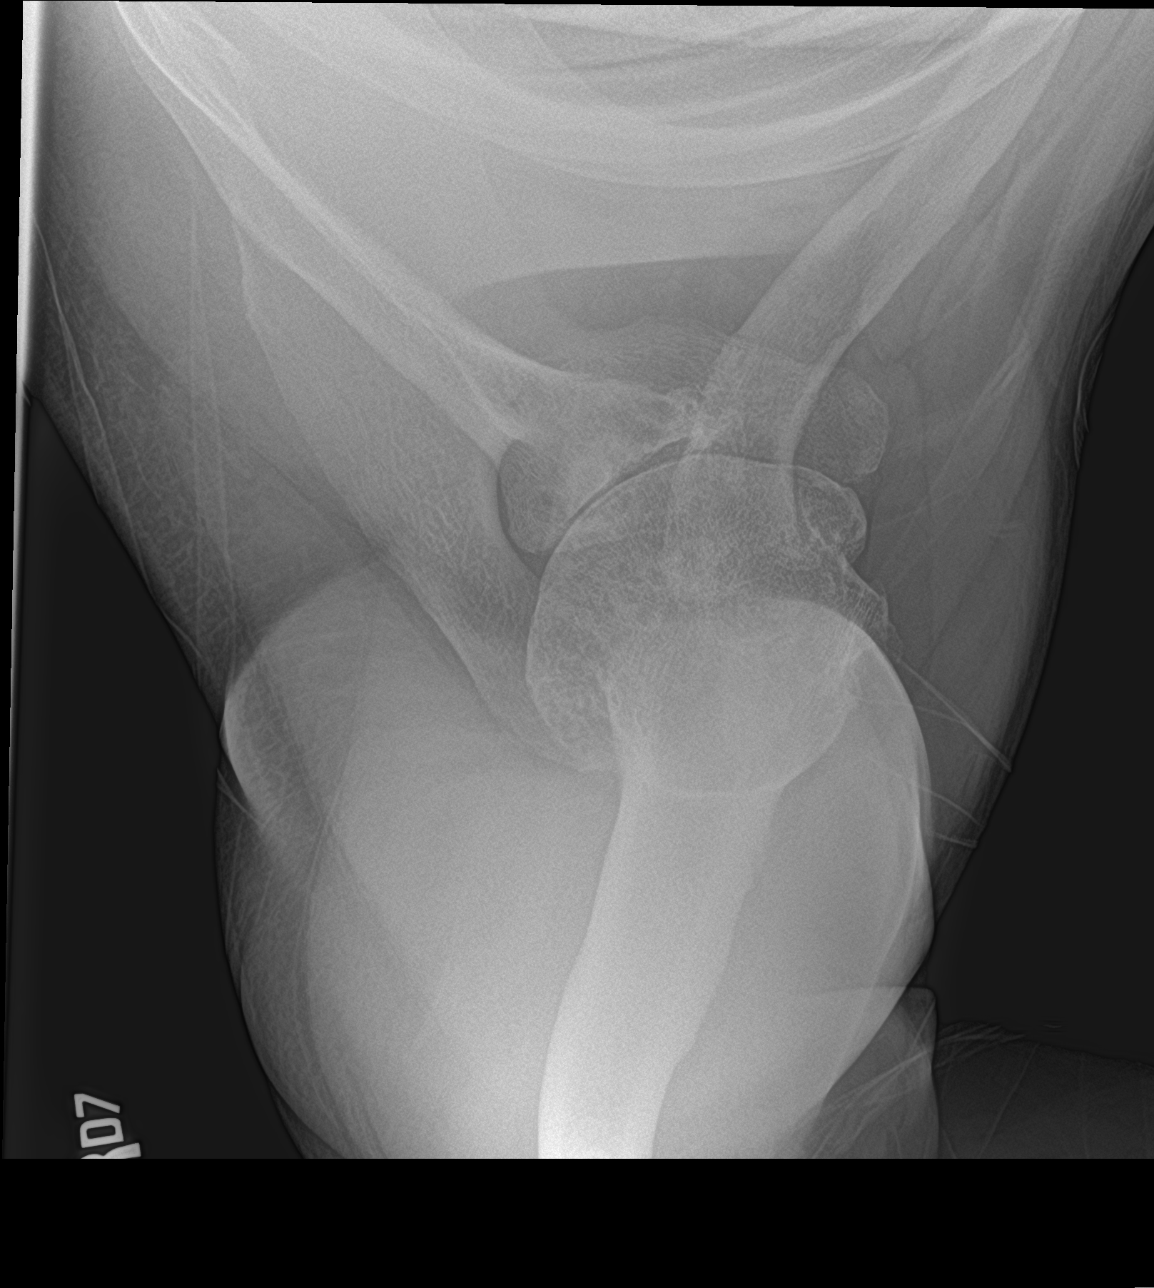

[shoulder ap neutral]
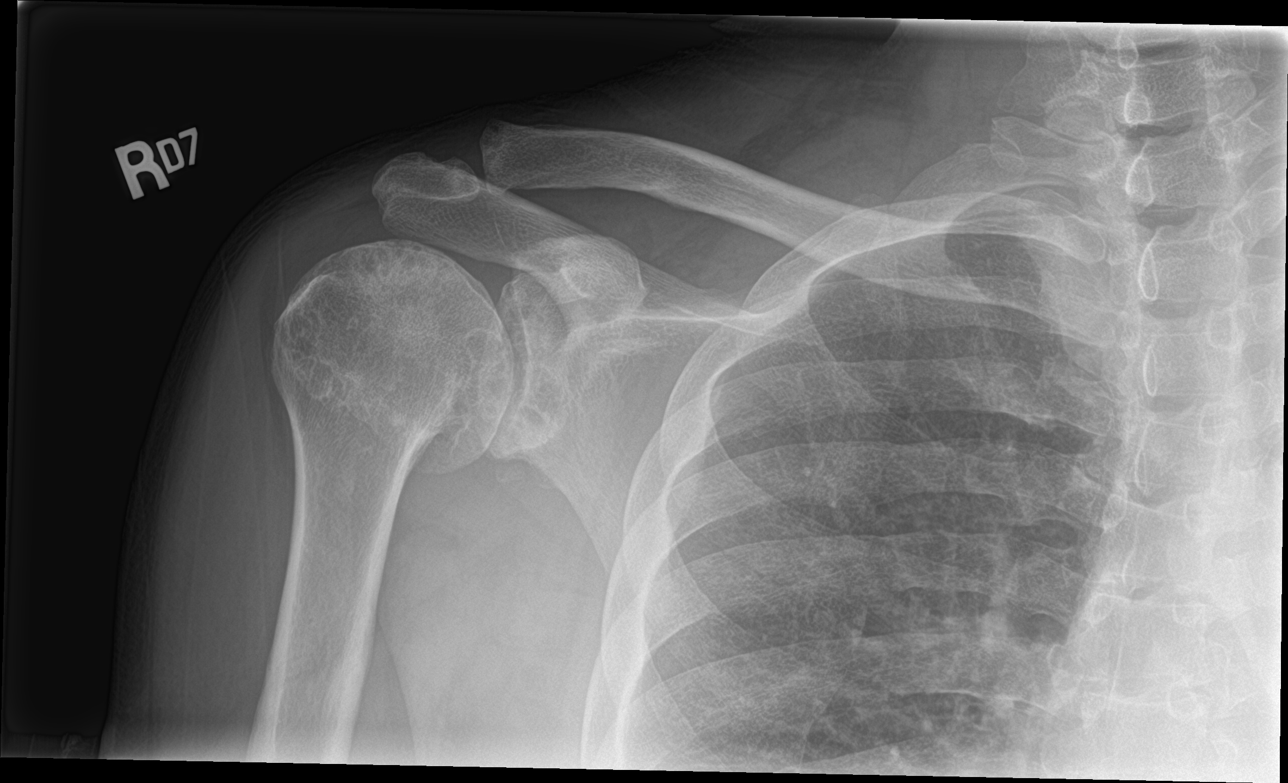

[4 of 4 positions shown; findings below may reference images not displayed]

FINDINGS: Oblique, Y scapular, and axillary images were obtained. No fracture
or dislocation. There is extensive osteoarthritis in the
glenohumeral joint with bony overgrowth along the humeral head as
well as multiple subchondral cysts. There is slight osteoarthritic
change in the acromioclavicular joint. No erosive change. Soft
tissue prominence in the lateral right shoulder may represent joint
effusion. Visualized right lung clear.
IMPRESSION: Extensive osteoarthritic change in the glenohumeral joint with much
milder osteoarthritic change in the acromioclavicular joint.
Question joint effusion. No fracture or dislocation. No erosion.

## 2019-07-10 MED ORDER — IBUPROFEN 400 MG PO TABS
600.0000 mg | ORAL_TABLET | Freq: Once | ORAL | Status: AC
  Administered 2019-07-10: 600 mg via ORAL
  Filled 2019-07-10: qty 1

## 2019-07-10 MED ORDER — HYDROCODONE-ACETAMINOPHEN 5-325 MG PO TABS
1.0000 | ORAL_TABLET | Freq: Once | ORAL | Status: AC
  Administered 2019-07-10: 1 via ORAL
  Filled 2019-07-10: qty 1

## 2019-07-10 MED ORDER — NAPROXEN 375 MG PO TABS: 375.0000 mg | ORAL_TABLET | Freq: Two times a day (BID) | ORAL | 0 refills | Status: DC | PRN

## 2019-07-10 NOTE — Discharge Instructions (Addendum)
Use ice, motrin and tylenol for pain. See your doctor for recheck of blood pressure later this week.

## 2019-07-10 NOTE — ED Triage Notes (Signed)
Patient in POV, reports R shoulder pain onset this AM when waking. Endorses pain with movement and palpation. Denies injury.

## 2019-07-10 NOTE — ED Provider Notes (Signed)
[facility] EMERGENCY DEPARTMENT Provider Note   CSN: [id number] Arrival date & time: 07/10/19  8101     History Chief Complaint  Patient presents with  . Shoulder Pain    Logan Nelson is a 54 y.o. male.  Patient with history of high blood pressure presents with right shoulder pain only with movement and palpation.  No injury.  Patient does work with his shoulders and arms as a Curator.  No direct trauma recently.  No history of shoulder issues in that joint recently.  No exertional component.  No fevers or chills.  No new shortness of breath.          Past Medical History:  Diagnosis Date  . Arthritis   . Hernia of abdominal wall   . Hypertension     Patient Active Problem List   Diagnosis Date Noted  . Hypertension 01/09/2015  . Incisional hernia 01/07/2015    Past Surgical History:  Procedure Laterality Date  . ABDOMINAL SURGERY         No family history on file.  Social History   Tobacco Use  . Smoking status: Former Smoker    Types: Cigarettes  . Smokeless tobacco: Never Used  Substance Use Topics  . Alcohol use: Yes  . Drug use: Not on file    Home Medications Prior to Admission medications   Medication Sig Start Date End Date Taking? Authorizing Provider  acetaminophen (TYLENOL) 325 MG tablet Take 2 tablets (650 mg total) by mouth every 6 (six) hours as needed. 10/10/18   Hedges, Tinnie Gens, PA-C  ibuprofen (ADVIL,MOTRIN) 200 MG tablet Take 400 mg by mouth every 6 (six) hours as needed for moderate pain.    [provider]  lisinopril-hydrochlorothiazide (PRINZIDE,ZESTORETIC) 10-12.5 MG tablet Take 1 tablet by mouth daily. 07/01/15   Benjiman Core, MD  naproxen (NAPROSYN) 375 MG tablet Take 1 tablet (375 mg total) by mouth 2 (two) times daily as needed. 07/10/19   Blane Ohara, MD    Allergies    Patient has no known allergies.  Review of Systems   Review of Systems  Constitutional: Negative for fever.    Respiratory: Negative for shortness of breath.   Cardiovascular: Negative for chest pain.  Gastrointestinal: Negative for abdominal pain and vomiting.  Genitourinary: Negative for flank pain.  Musculoskeletal: Positive for joint swelling. Negative for back pain, neck pain and neck stiffness.  Skin: Negative for rash.  Neurological: Negative for light-headedness and headaches.    Physical Exam Updated Vital Signs BP (!) 168/105 (BP Location: Left Arm)   Pulse 89   Temp 99 F (37.2 C) (Oral)   Resp 18   Ht 5\' 6"  (1.676 m)   Wt 91.4 kg   SpO2 99%   BMI 32.52 kg/m   Physical Exam Vitals and nursing note reviewed.  Constitutional:      Appearance: He is well-developed.  HENT:     Head: Normocephalic and atraumatic.  Eyes:     General:        Right eye: No discharge.        Left eye: No discharge.     Conjunctiva/sclera: Conjunctivae normal.  Neck:     Trachea: No tracheal deviation.  Cardiovascular:     Rate and Rhythm: Normal rate.  Pulmonary:     Effort: Pulmonary effort is normal.  Abdominal:     Palpations: Abdomen is soft.     Tenderness: There is no abdominal tenderness.  Musculoskeletal:  General: Tenderness present.     Cervical back: Normal range of motion and neck supple.     Comments: Patient has exquisite tenderness to palpation of anterior shoulder joint and sternoclavicular region.  Patient has decreased range of motion due to pain.  Patient has normal strength with abduction, external range of motion of right shoulder.  Patient has normal strength with extension flexion of wrist and fingers.    Skin:    General: Skin is warm.     Findings: No rash.  Neurological:     Mental Status: He is alert and oriented to person, place, and time.     ED Results / Procedures / Treatments   Labs (all labs ordered are listed, but only abnormal results are displayed) Labs Reviewed - No data to display  EKG None  Radiology DG Chest 1 View  Result Date:  07/10/2019 CLINICAL DATA:  Chest pain. Additional provided: Patient reports right shoulder pain onset this morning when waking, pain with movement and palpation. EXAM: CHEST  1 VIEW COMPARISON:  Prior chest radiographs 01/23/2019 and earlier FINDINGS: Heart size within normal limits. Ill-defined opacities within the bilateral lung bases which may reflect atelectasis and/or scarring. Pneumonia cannot be excluded. No evidence of pleural effusion or pneumothorax. No acute bony abnormality is identified. Degenerative changes of the right glenohumeral and bilateral acromioclavicular joints. IMPRESSION: Ill-defined opacities within the bilateral lung bases, which may reflect atelectasis and/or scarring. Pneumonia cannot be excluded. Degenerative changes of the right glenohumeral and bilateral acromioclavicular joints. Electronically Signed   By: Kellie Simmering DO   On: 07/10/2019 08:27   DG Shoulder Right  Result Date: 07/10/2019 CLINICAL DATA:  Pain EXAM: RIGHT SHOULDER - 2+ VIEW COMPARISON:  None. FINDINGS: Oblique, Y scapular, and axillary images were obtained. No fracture or dislocation. 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 There is slight osteoarthritic change in the acromioclavicular joint. No erosive change. Soft tissue prominence in the lateral right shoulder may represent joint effusion. Visualized right lung clear. 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 Question joint effusion. No fracture or dislocation. No erosion. Electronically Signed   By: Lowella Grip III M.D.   On: 07/10/2019 08:36    Procedures Procedures (including critical care time)  Medications Ordered in ED Medications  HYDROcodone-acetaminophen (NORCO/VICODIN) 5-325 MG per tablet 1 tablet (1 tablet Oral Given 07/10/19 0813)    ibuprofen (ADVIL) tablet 600 mg (600 mg Oral Given 07/10/19 0813)    ED Course  I have reviewed the triage vital signs and the nursing notes.  Pertinent labs & imaging results that were available during my care of the patient were reviewed by me and considered in my medical decision making (see chart for details).    MDM Rules/Calculators/A&P                      Patient presents with clinical concern for musculoskeletal origin of pain with exquisite tenderness with movement and palpation.  Anti-inflammatory and pain medicines given the ER.  X-rays reviewed showing significant arthritis.  Fup with sports medicine with NSAIDS, work note and fup bp outpt.   Final Clinical Impression(s) / ED Diagnoses Final diagnoses:  Right shoulder strain, initial encounter    Rx / DC Orders ED Discharge Orders         Ordered    naproxen (NAPROSYN)  375 MG tablet  2 times daily PRN     07/10/19 0844           Blane Ohara, MD 07/10/19 [phone]

## 2019-07-15 ENCOUNTER — Ambulatory Visit: Payer: Self-pay | Admitting: Family Medicine

## 2019-10-08 ENCOUNTER — Ambulatory Visit: Payer: Self-pay

## 2020-07-02 ENCOUNTER — Encounter (HOSPITAL_COMMUNITY): Payer: Self-pay | Admitting: Emergency Medicine

## 2020-10-12 ENCOUNTER — Emergency Department (HOSPITAL_COMMUNITY): Payer: Self-pay

## 2020-10-12 ENCOUNTER — Other Ambulatory Visit: Payer: Self-pay

## 2020-10-12 ENCOUNTER — Inpatient Hospital Stay (HOSPITAL_COMMUNITY)
Admission: EM | Admit: 2020-10-12 | Discharge: 2020-10-15 | DRG: 389 | Disposition: A | Discharge status: Home / Self Care | Payer: Self-pay | Attending: Internal Medicine | Admitting: Internal Medicine

## 2020-10-12 DIAGNOSIS — Z8719 Personal history of other diseases of the digestive system: Secondary | ICD-10-CM

## 2020-10-12 DIAGNOSIS — Z5320 Procedure and treatment not carried out because of patient's decision for unspecified reasons: Secondary | ICD-10-CM | POA: Diagnosis not present

## 2020-10-12 DIAGNOSIS — K432 Incisional hernia without obstruction or gangrene: Secondary | ICD-10-CM | POA: Diagnosis present

## 2020-10-12 DIAGNOSIS — E538 Deficiency of other specified B group vitamins: Secondary | ICD-10-CM | POA: Diagnosis present

## 2020-10-12 DIAGNOSIS — D61818 Other pancytopenia: Secondary | ICD-10-CM | POA: Diagnosis present

## 2020-10-12 DIAGNOSIS — E669 Obesity, unspecified: Secondary | ICD-10-CM | POA: Diagnosis present

## 2020-10-12 DIAGNOSIS — K760 Fatty (change of) liver, not elsewhere classified: Secondary | ICD-10-CM | POA: Diagnosis present

## 2020-10-12 DIAGNOSIS — Z6832 Body mass index (BMI) 32.0-32.9, adult: Secondary | ICD-10-CM

## 2020-10-12 DIAGNOSIS — K56609 Unspecified intestinal obstruction, unspecified as to partial versus complete obstruction: Secondary | ICD-10-CM | POA: Diagnosis present

## 2020-10-12 DIAGNOSIS — Z20822 Contact with and (suspected) exposure to covid-19: Secondary | ICD-10-CM | POA: Diagnosis present

## 2020-10-12 DIAGNOSIS — K439 Ventral hernia without obstruction or gangrene: Secondary | ICD-10-CM | POA: Diagnosis present

## 2020-10-12 DIAGNOSIS — G8929 Other chronic pain: Secondary | ICD-10-CM | POA: Diagnosis present

## 2020-10-12 DIAGNOSIS — K429 Umbilical hernia without obstruction or gangrene: Secondary | ICD-10-CM | POA: Diagnosis present

## 2020-10-12 DIAGNOSIS — K469 Unspecified abdominal hernia without obstruction or gangrene: Secondary | ICD-10-CM

## 2020-10-12 DIAGNOSIS — Z0189 Encounter for other specified special examinations: Secondary | ICD-10-CM

## 2020-10-12 DIAGNOSIS — I10 Essential (primary) hypertension: Secondary | ICD-10-CM | POA: Diagnosis present

## 2020-10-12 DIAGNOSIS — D519 Vitamin B12 deficiency anemia, unspecified: Secondary | ICD-10-CM | POA: Diagnosis present

## 2020-10-12 DIAGNOSIS — M1990 Unspecified osteoarthritis, unspecified site: Secondary | ICD-10-CM | POA: Diagnosis present

## 2020-10-12 DIAGNOSIS — R042 Hemoptysis: Secondary | ICD-10-CM

## 2020-10-12 DIAGNOSIS — Z9049 Acquired absence of other specified parts of digestive tract: Secondary | ICD-10-CM

## 2020-10-12 DIAGNOSIS — K5651 Intestinal adhesions [bands], with partial obstruction: Principal | ICD-10-CM | POA: Diagnosis present

## 2020-10-12 DIAGNOSIS — K4090 Unilateral inguinal hernia, without obstruction or gangrene, not specified as recurrent: Secondary | ICD-10-CM | POA: Diagnosis present

## 2020-10-12 DIAGNOSIS — F17210 Nicotine dependence, cigarettes, uncomplicated: Secondary | ICD-10-CM | POA: Diagnosis present

## 2020-10-12 LAB — COMPREHENSIVE METABOLIC PANEL
ALT: 48 U/L — ABNORMAL HIGH (ref 0–44)
AST: 116 U/L — ABNORMAL HIGH (ref 15–41)
Albumin: 3.7 g/dL (ref 3.5–5.0)
Alkaline Phosphatase: 90 U/L (ref 38–126)
Anion gap: 7 (ref 5–15)
BUN: 25 mg/dL — ABNORMAL HIGH (ref 6–20)
CO2: 21 mmol/L — ABNORMAL LOW (ref 22–32)
Calcium: 8.9 mg/dL (ref 8.9–10.3)
Chloride: 105 mmol/L (ref 98–111)
Creatinine, Ser: 1.26 mg/dL — ABNORMAL HIGH (ref 0.61–1.24)
GFR, Estimated: >60 mL/min (ref >60)
Glucose, Bld: 96 mg/dL (ref 70–99)
Potassium: 3.8 mmol/L (ref 3.5–5.1)
Sodium: 133 mmol/L — ABNORMAL LOW (ref 135–145)
Total Bilirubin: 0.8 mg/dL (ref 0.3–1.2)
Total Protein: 6.8 g/dL (ref 6.5–8.1)

## 2020-10-12 LAB — CBC WITH DIFFERENTIAL/PLATELET
Abs Immature Granulocytes: 0.02 10*3/uL (ref 0.00–0.07)
Basophils Absolute: 0 10*3/uL (ref 0.0–0.1)
Basophils Relative: 0 %
Eosinophils Absolute: 0.1 10*3/uL (ref 0.0–0.5)
Eosinophils Relative: 2 %
HCT: 18.9 % — ABNORMAL LOW (ref 39.0–52.0)
Hemoglobin: 6.4 g/dL — CL (ref 13.0–17.0)
Immature Granulocytes: 1 %
Lymphocytes Relative: 52 %
Lymphs Abs: 2 10*3/uL (ref 0.7–4.0)
MCH: 41.3 pg — ABNORMAL HIGH (ref 26.0–34.0)
MCHC: 33.9 g/dL (ref 30.0–36.0)
MCV: 121.9 fL — ABNORMAL HIGH (ref 80.0–100.0)
Monocytes Absolute: 0.1 10*3/uL (ref 0.1–1.0)
Monocytes Relative: 3 %
Neutro Abs: 1.6 10*3/uL — ABNORMAL LOW (ref 1.7–7.7)
Neutrophils Relative %: 42 %
Platelets: 100 10*3/uL — ABNORMAL LOW (ref 150–400)
RBC: 1.55 MIL/uL — ABNORMAL LOW (ref 4.22–5.81)
RDW: 19 % — ABNORMAL HIGH (ref 11.5–15.5)
WBC: 3.8 10*3/uL — ABNORMAL LOW (ref 4.0–10.5)
nRBC: 0.8 % — ABNORMAL HIGH (ref 0.0–0.2)

## 2020-10-12 LAB — IRON AND TIBC
Iron: 148 ug/dL (ref 45–182)
Saturation Ratios: 81 % — ABNORMAL HIGH (ref 17.9–39.5)
TIBC: 182 ug/dL — ABNORMAL LOW (ref 250–450)
UIBC: 34 ug/dL

## 2020-10-12 LAB — RETICULOCYTES
Immature Retic Fract: 20.4 % — ABNORMAL HIGH (ref 2.3–15.9)
RBC.: 1.55 MIL/uL — ABNORMAL LOW (ref 4.22–5.81)
Retic Count, Absolute: 11 10*3/uL — ABNORMAL LOW (ref 19.0–186.0)
Retic Ct Pct: 0.7 % (ref 0.4–3.1)

## 2020-10-12 LAB — LIPASE, BLOOD: Lipase: 23 U/L (ref 11–51)

## 2020-10-12 LAB — PREPARE RBC (CROSSMATCH)

## 2020-10-12 LAB — ABO/RH: ABO/RH(D): O POS

## 2020-10-12 LAB — FERRITIN: Ferritin: 1318 ng/mL — ABNORMAL HIGH (ref 24–336)

## 2020-10-12 LAB — FOLATE: Folate: 18.5 ng/mL (ref >5.9)

## 2020-10-12 LAB — VITAMIN B12: Vitamin B-12: <50 pg/mL — ABNORMAL LOW (ref 180–914)

## 2020-10-12 IMAGING — CT CT ABD-PELV W/ CM
2 of 5 series · 15 of 46 positions shown, 17 images · IV contrast (APPLIED)
Comparison: CT [DATE]

CLINICAL DATA: Acute abdominal pain.  Hernia.  Query obstruction.

EXAM:
CT ABDOMEN AND PELVIS WITH CONTRAST
TECHNIQUE: Multidetector CT imaging of the abdomen and pelvis was performed
using the standard protocol following bolus administration of
intravenous contrast.
CONTRAST:  75mL OMNIPAQUE IOHEXOL 350 MG/ML SOLN

[Series 3: abdomen 5.0 · axial · 0.79mm/px · z∈[-323,+37]mm · 12 of 84 slices shown, 14 images]
[im 6/84  soft-tissue]
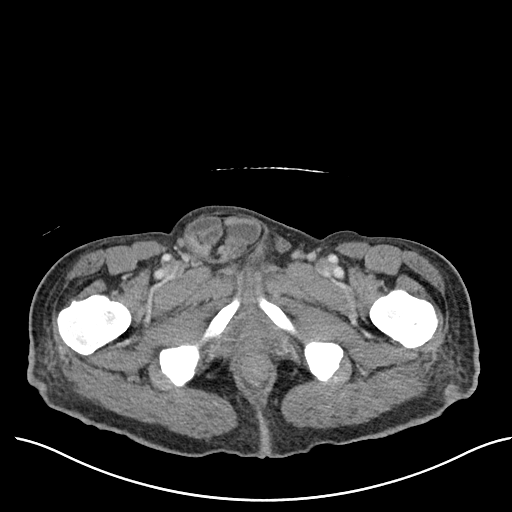
[im 6/84  bone]
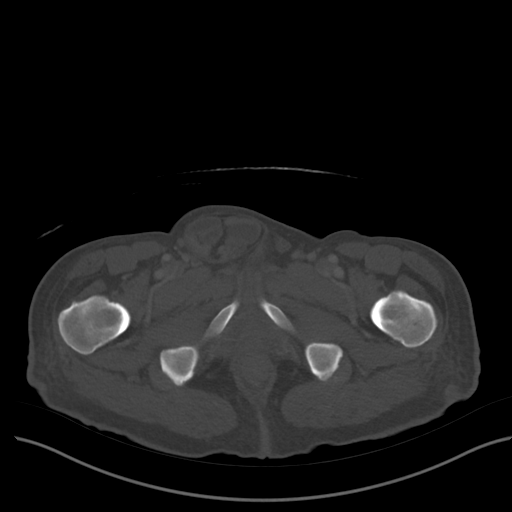
[im 12/84  soft-tissue]
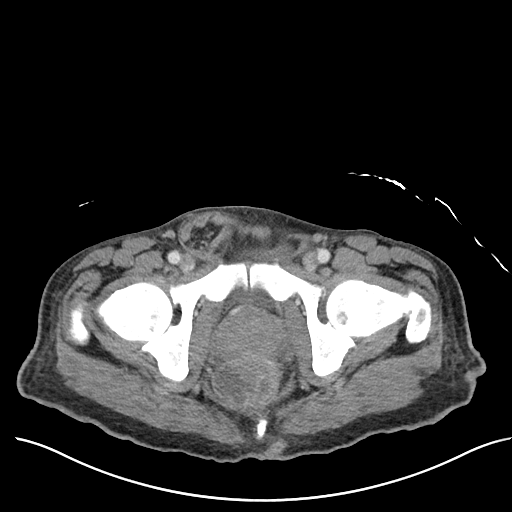
[im 17/84  soft-tissue]
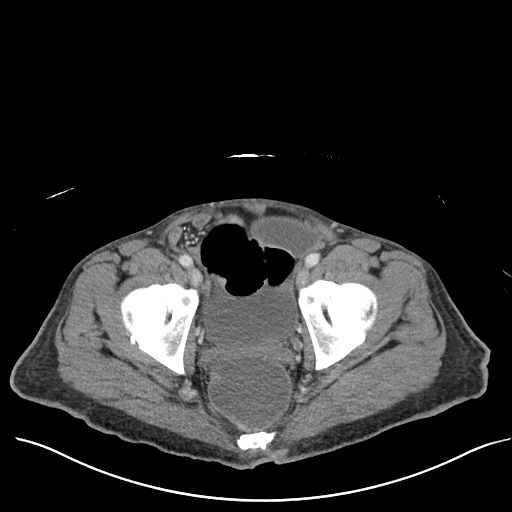
[im 28/84  soft-tissue]
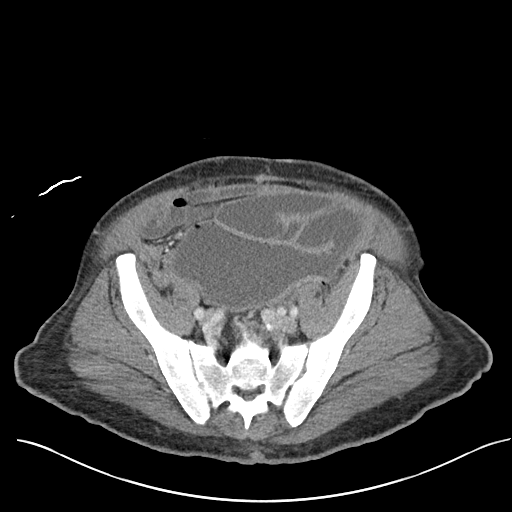
[im 34/84  soft-tissue]
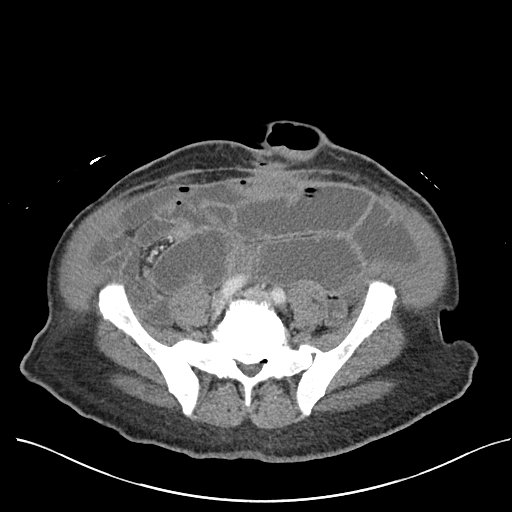
[im 39/84  soft-tissue]
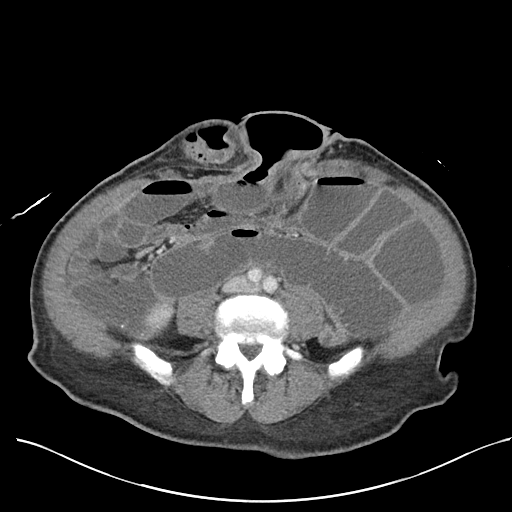
[im 45/84  soft-tissue]
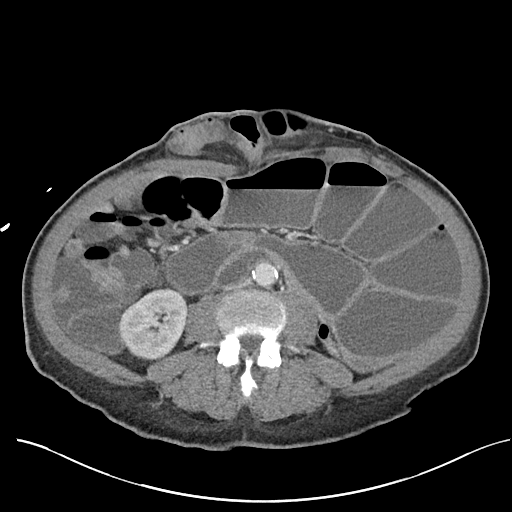
[im 50/84  soft-tissue]
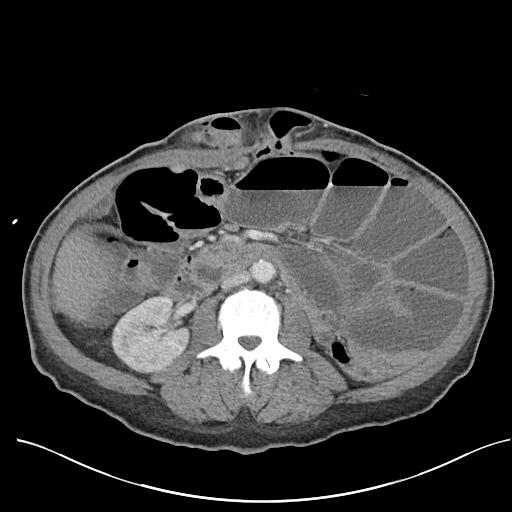
[im 56/84  soft-tissue]
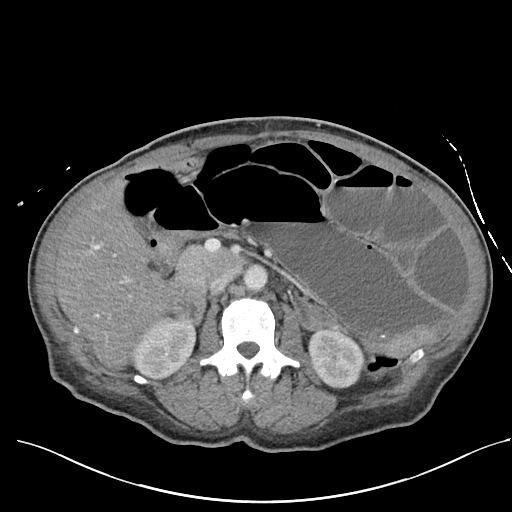
[im 56/84  bone]
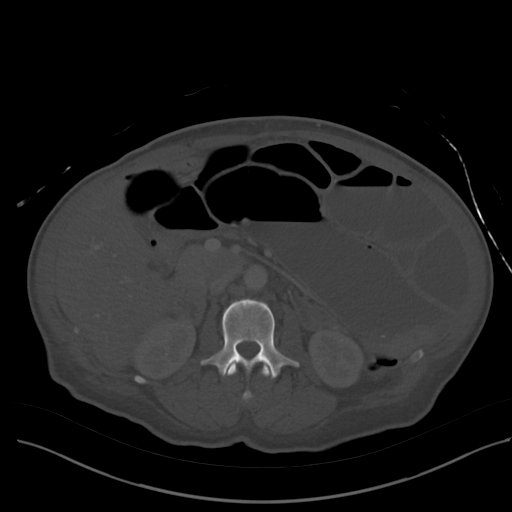
[im 67/84  soft-tissue]
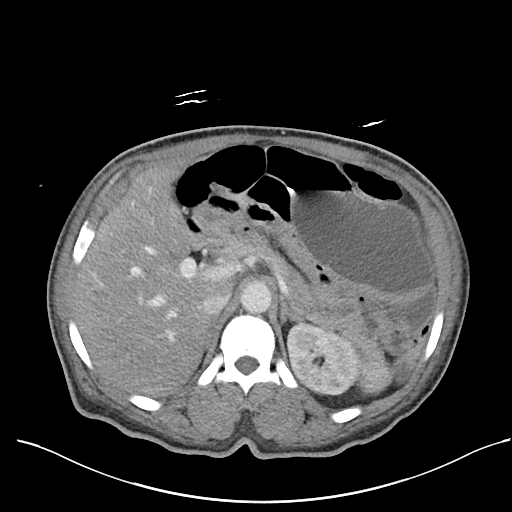
[im 72/84  soft-tissue]
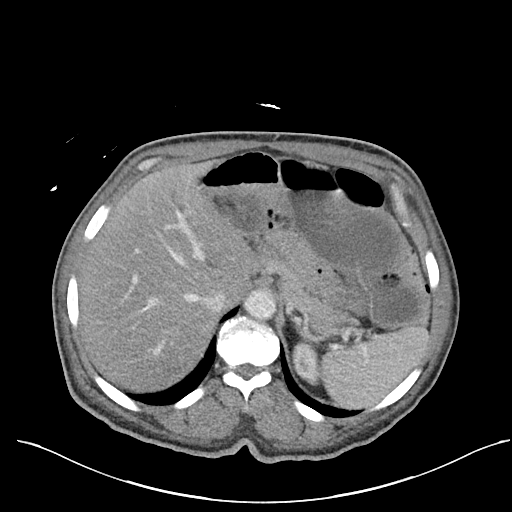
[im 78/84  soft-tissue]
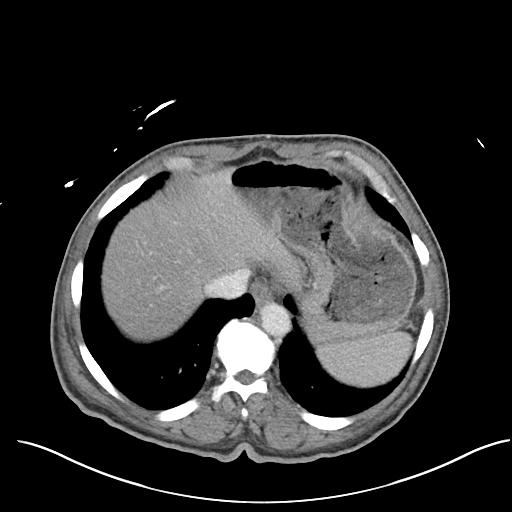

[Series 6: abdomen 3.0 mpr cor · coronal · 0.76mm/px · 3 of 98 slices shown]
[im 33/98  soft-tissue]
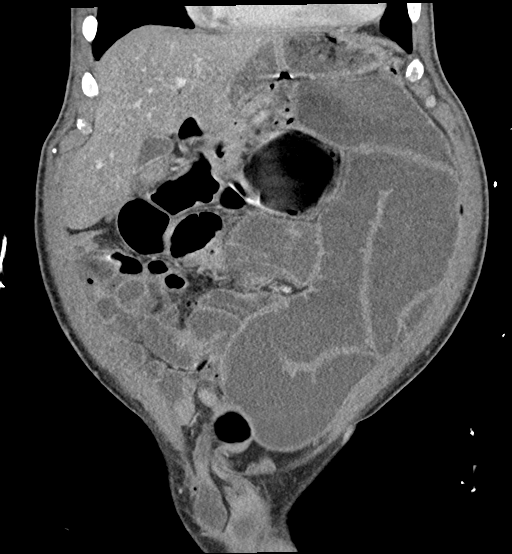
[im 44/98  soft-tissue]
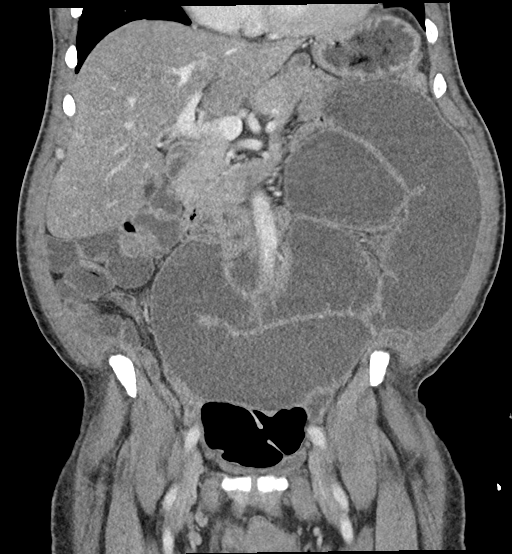
[im 54/98  soft-tissue]
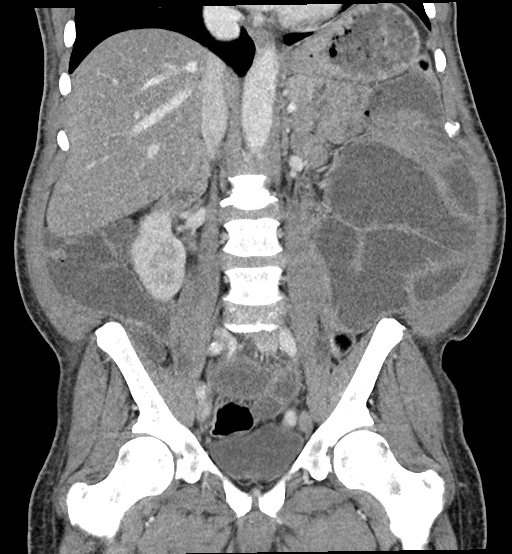

[15 of 46 positions shown; findings below may reference images not displayed]

FINDINGS: Lower chest: Mild heterogeneous pulmonary parenchyma at the lung
bases. No acute airspace disease or pleural effusion. Upper normal
heart size.

Hepatobiliary: Diffusely decreased hepatic density typical of
steatosis. Subcapsular cyst in the medial right hepatic lobe. No
suspicious liver lesion. Gallbladder physiologically distended, no
calcified stone. No biliary dilatation.

Pancreas: Grossly negative, partially obscured by adjacent bowel
loops. No evidence of pancreatic inflammation.

Spleen: Normal in size without focal abnormality.

Adrenals/Urinary Tract: No adrenal nodule. No hydronephrosis or
perinephric edema. Homogeneous renal enhancement with symmetric
excretion on delayed phase imaging. No evidence of focal renal
abnormality or stone. Urinary bladder is partially distended without
wall thickening.

Stomach/Bowel: Mid and upper abdominal small bowel are diffusely
dilated and fluid-filled. Complex umbilical hernia with a bilobed
appearance, bilobed hernia defect both above and separate hernia
below below the umbilicus. The more superior hernia defect involves
short segment of transverse colon, without evidence of colonic
obstruction. There is fluid within the cecum and proximal transverse
colon, but no abnormal distension, in the distal small bowel is
decompressed. The more inferior hernia contains a loop of small
bowel, with the exiting small bowel being decompressed, likely cause
of obstruction. Small bowel proximal to this is diffusely dilated
and fluid-filled. The stomach is distended with ingested contents.
There is also a right inguinal hernia, not entirely included in the
field of view, however appears to contain nonobstructed or inflamed
small bowel. The terminal ileum is difficult to define. Transverse
colon is nondistended. Fluid within the sigmoid colon. No evidence
of bowel pneumatosis, significant mesenteric edema, or inflammation.
No perforation. Appendix not visualized.

Vascular/Lymphatic: Normal caliber abdominal aorta. Patent portal
vein. No abdominopelvic adenopathy.

Reproductive: Prominent prostate gland spans 5.2 cm transverse.

Other: Abdominal wall hernias as described.  No free air or ascites.

Musculoskeletal: L5-S1 degenerative disc disease. Areas of endplate
sclerosis involving the lower thoracic and lumbar spine are felt to
be degenerative. There are no acute or suspicious osseous
abnormalities.
IMPRESSION: 1. Small bowel obstruction related to infraumbilical hernia. No
evidence of ischemia or perforation.
2. There is an adjacent bilobed supraumbilical hernia containing
transverse colon. No obstruction related to this hernia.
3. Right inguinal hernia, not entirely included in the field of
view, however appears to contain nonobstructed or inflamed small
bowel.
4. Hepatic steatosis.
5. Enlarged prostate gland.

## 2020-10-12 MED ORDER — SODIUM CHLORIDE 0.9 % IV SOLN
10.0000 mL/h | Freq: Once | INTRAVENOUS | Status: AC
  Administered 2020-10-13: 10 mL/h via INTRAVENOUS

## 2020-10-12 MED ORDER — ONDANSETRON HCL 4 MG/2ML IJ SOLN
4.0000 mg | Freq: Once | INTRAMUSCULAR | Status: AC
  Administered 2020-10-12: 4 mg via INTRAVENOUS
  Filled 2020-10-12: qty 2

## 2020-10-12 MED ORDER — MORPHINE SULFATE (PF) 4 MG/ML IV SOLN
4.0000 mg | Freq: Once | INTRAVENOUS | Status: AC
  Administered 2020-10-12: 4 mg via INTRAVENOUS
  Filled 2020-10-12: qty 1

## 2020-10-12 MED ORDER — SODIUM CHLORIDE 0.9 % IV BOLUS
1000.0000 mL | Freq: Once | INTRAVENOUS | Status: AC
  Administered 2020-10-12: 1000 mL via INTRAVENOUS

## 2020-10-12 MED ORDER — IOHEXOL 350 MG/ML SOLN
75.0000 mL | Freq: Once | INTRAVENOUS | Status: AC | PRN
  Administered 2020-10-12: 75 mL via INTRAVENOUS

## 2020-10-12 NOTE — ED Triage Notes (Signed)
Pt c/p pain from a hernia on his abdomen.

## 2020-10-12 NOTE — ED Provider Notes (Signed)
[facility] EMERGENCY DEPARTMENT Provider Note   CSN: [id number] Arrival date & time: 10/12/20  1859     History Chief Complaint  Patient presents with   Hernia    Logan Nelson is a 55 y.o. male.  HPI     55 year old male with history of colectomy for likely diverticulitis with subsequent takedown, prior admission to [facility] in 2016 for small bowel obstruction related to hernias, hypertension, who presents with concern for worsening abdominal pain, nausea and vomiting.  Presents with concern for abdominal pain, over 6 months,worsening over last 4 days, will wax and wane over time and tired of it.  Nausea and vomiting today,gets it when tries to eat too much Weight loss unintentional because of inability to eat Has been like this since surgery Can't live with this pain, across the whole abdomen Does report that his nausea vomiting is somewhat chronic.  He has passed flatus earlier today.  Reports feeling constipated but having loose stool.  Multiple prior surgeries including here and [facility]   No hx of bleeding, no black or bloody stools, no hematemesis No hematuria, no trauma Denies regular alcohol use, NSAID use No fever or urinary symptoms. Did have syncopal event 2 days ago. No shortness of breath or chest pain  Past Medical History:  Diagnosis Date   Arthritis    Hernia of abdominal wall    Hypertension     Patient Active Problem List   Diagnosis Date Noted   Small bowel obstruction (HCC) 10/13/2020   Hypertension 01/09/2015   Incisional hernia 01/07/2015    Past Surgical History:  Procedure Laterality Date   ABDOMINAL SURGERY         No family history on file.  Social History   Tobacco Use   Smoking status: Former    Types: Cigarettes   Smokeless tobacco: Never  Vaping Use   Vaping Use: Never used  Substance Use Topics   Alcohol use: Yes    Home Medications Prior to Admission medications   Medication Sig Start Date  End Date Taking? Authorizing Provider  acetaminophen (TYLENOL) 325 MG tablet Take 2 tablets (650 mg total) by mouth every 6 (six) hours as needed. Patient not taking: Reported on 10/13/2020 10/10/18   Hedges, Tinnie Gens, PA-C  lisinopril-hydrochlorothiazide (PRINZIDE,ZESTORETIC) 10-12.5 MG tablet Take 1 tablet by mouth daily. Patient not taking: Reported on 10/13/2020 07/01/15   Benjiman Core, MD  naproxen (NAPROSYN) 375 MG tablet Take 1 tablet (375 mg total) by mouth 2 (two) times daily as needed. Patient not taking: Reported on 10/13/2020 07/10/19   Blane Ohara, MD    Allergies    Patient has no known allergies.  Review of Systems   Review of Systems  Constitutional:  Negative for fever.  HENT:  Negative for sore throat.   Eyes:  Negative for visual disturbance.  Respiratory:  Negative for cough and shortness of breath.   Cardiovascular:  Negative for chest pain.  Gastrointestinal:  Positive for abdominal pain, constipation, nausea and vomiting. Negative for anal bleeding and blood in stool.  Genitourinary:  Negative for difficulty urinating.  Musculoskeletal:  Negative for back pain and neck stiffness.  Skin:  Negative for rash.  Neurological:  Negative for syncope and headaches.   Physical Exam Updated Vital Signs BP 108/62   Pulse 61   Temp 98.3 F (36.8 C) (Oral)   Resp 17   Ht 5\' 6"  (1.676 m)   Wt 92 kg   SpO2 96%  BMI 32.74 kg/m   Physical Exam Vitals and nursing note reviewed. Exam conducted with a chaperone present.  Constitutional:      General: He is not in acute distress.    Appearance: He is well-developed. He is not diaphoretic.  HENT:     Head: Normocephalic and atraumatic.  Eyes:     Conjunctiva/sclera: Conjunctivae normal.  Cardiovascular:     Rate and Rhythm: Normal rate and regular rhythm.     Heart sounds: Normal heart sounds. No murmur heard.   No friction rub. No gallop.  Pulmonary:     Effort: Pulmonary effort is normal. No respiratory distress.      Breath sounds: Normal breath sounds. No wheezing or rales.  Abdominal:     General: There is no distension.     Palpations: Abdomen is soft.     Tenderness: There is no abdominal tenderness. There is no guarding.     Hernia: A hernia is present. Hernia is present in the right inguinal area.  Genitourinary:    Comments: Rectal pain Yellow-brown stool, no gross bleeding  Musculoskeletal:     Cervical back: Normal range of motion.  Skin:    General: Skin is warm and dry.  Neurological:     Mental Status: He is alert and oriented to person, place, and time.    ED Results / Procedures / Treatments   Labs (all labs ordered are listed, but only abnormal results are displayed) Labs Reviewed  COMPREHENSIVE METABOLIC PANEL - Abnormal; Notable for the following components:      Result Value   Sodium 133 (*)    CO2 21 (*)    BUN 25 (*)    Creatinine, Ser 1.26 (*)    AST 116 (*)    ALT 48 (*)    All other components within normal limits  CBC WITH DIFFERENTIAL/PLATELET - Abnormal; Notable for the following components:   WBC 3.8 (*)    RBC 1.55 (*)    Hemoglobin 6.4 (*)    HCT 18.9 (*)    MCV 121.9 (*)    MCH 41.3 (*)    RDW 19.0 (*)    Platelets 100 (*)    nRBC 0.8 (*)    Neutro Abs 1.6 (*)    All other components within normal limits  VITAMIN B12 - Abnormal; Notable for the following components:   Vitamin B-12 <50 (*)    All other components within normal limits  IRON AND TIBC - Abnormal; Notable for the following components:   TIBC 182 (*)    Saturation Ratios 81 (*)    All other components within normal limits  FERRITIN - Abnormal; Notable for the following components:   Ferritin 1,318 (*)    All other components within normal limits  RETICULOCYTES - Abnormal; Notable for the following components:   RBC. 1.55 (*)    Retic Count, Absolute 11.0 (*)    Immature Retic Fract 20.4 (*)    All other components within normal limits  RESP PANEL BY RT-PCR (FLU A&B, COVID)  ARPGX2  LIPASE, BLOOD  FOLATE  POC OCCULT BLOOD, ED  TYPE AND SCREEN  PREPARE RBC (CROSSMATCH)  ABO/RH    EKG None  Radiology CT ABDOMEN PELVIS W CONTRAST  Result Date: 10/12/2020 CLINICAL DATA:  Acute abdominal pain.  Hernia.  Query obstruction. EXAM: CT ABDOMEN AND PELVIS WITH CONTRAST TECHNIQUE: Multidetector CT imaging of the abdomen and pelvis was performed using the standard protocol following bolus administration of intravenous contrast. CONTRAST:  4mL OMNIPAQUE IOHEXOL 350 MG/ML SOLN COMPARISON:  CT 01/07/2015 FINDINGS: Lower chest: Mild heterogeneous pulmonary parenchyma at the lung bases. No acute airspace disease or pleural effusion. Upper normal heart size. Hepatobiliary: Diffusely decreased hepatic density typical of steatosis. Subcapsular cyst in the medial right hepatic lobe. No suspicious liver lesion. Gallbladder physiologically distended, no calcified stone. No biliary dilatation. Pancreas: Grossly negative, partially obscured by adjacent bowel loops. No evidence of pancreatic inflammation. Spleen: Normal in size without focal abnormality. Adrenals/Urinary Tract: No adrenal nodule. No hydronephrosis or perinephric edema. Homogeneous renal enhancement with symmetric excretion on delayed phase imaging. No evidence of focal renal abnormality or stone. Urinary bladder is partially distended without wall thickening. Stomach/Bowel: Mid and upper abdominal small bowel are diffusely dilated and fluid-filled. Complex umbilical hernia with a bilobed appearance, bilobed hernia defect both above and separate hernia below below the umbilicus. The more superior hernia defect involves short segment of transverse colon, without evidence of colonic obstruction. There is fluid within the cecum and proximal transverse colon, but no abnormal distension, in the distal small bowel is decompressed. The more inferior hernia contains a loop of small bowel, with the exiting small bowel being decompressed,  likely cause of obstruction. Small bowel proximal to this is diffusely dilated and fluid-filled. The stomach is distended with ingested contents. There is also a right inguinal hernia, not entirely included in the field of view, however appears to contain nonobstructed or inflamed small bowel. The terminal ileum is difficult to define. Transverse colon is nondistended. Fluid within the sigmoid colon. No evidence of bowel pneumatosis, significant mesenteric edema, or inflammation. No perforation. Appendix not visualized. Vascular/Lymphatic: Normal caliber abdominal aorta. Patent portal vein. No abdominopelvic adenopathy. Reproductive: Prominent prostate gland spans 5.2 cm transverse. Other: Abdominal wall hernias as described.  No free air or ascites. Musculoskeletal: L5-S1 degenerative disc disease. Areas of endplate sclerosis involving the lower thoracic and lumbar spine are felt to be degenerative. There are no acute or suspicious osseous abnormalities. IMPRESSION: 1. Small bowel obstruction related to infraumbilical hernia. No evidence of ischemia or perforation. 2. There is an adjacent bilobed supraumbilical hernia containing transverse colon. No obstruction related to this hernia. 3. Right inguinal hernia, not entirely included in the field of view, however appears to contain nonobstructed or inflamed small bowel. 4. Hepatic steatosis. 5. Enlarged prostate gland. Electronically Signed   By: Narda Rutherford M.D.   On: 10/12/2020 23:10    Procedures .Critical Care  Date/Time: 10/13/2020 12:59 AM Performed by: Alvira Monday, MD Authorized by: Alvira Monday, MD   Critical care provider statement:    Critical care time (minutes):  45   Critical care was time spent personally by me on the following activities:  Discussions with consultants, evaluation of patient's response to treatment, examination of patient, ordering and performing treatments and interventions, ordering and review of laboratory  studies, ordering and review of radiographic studies, pulse oximetry, re-evaluation of patient's condition, obtaining history from patient or surrogate and review of old charts   Medications Ordered in ED Medications  0.9 %  sodium chloride infusion (has no administration in time range)  ondansetron (ZOFRAN) injection 4 mg (4 mg Intravenous Given 10/12/20 2353)  morphine 4 MG/ML injection 4 mg (4 mg Intravenous Given 10/12/20 2353)  iohexol (OMNIPAQUE) 350 MG/ML injection 75 mL (75 mLs Intravenous Contrast Given 10/12/20 2300)  sodium chloride 0.9 % bolus 1,000 mL (1,000 mLs Intravenous New Bag/Given 10/12/20 2353)    ED Course  I have reviewed the triage vital  signs and the nursing notes.  Pertinent labs & imaging results that were available during my care of the patient were reviewed by me and considered in my medical decision making (see chart for details).    MDM Rules/Calculators/A&P                            55 year old male with history of colectomy for likely diverticulitis with subsequent takedown, prior admission to [facility] in 2016 for small bowel obstruction related to hernias, hypertension, who presents with concern for worsening abdominal pain, nausea and vomiting.  Labs show hemoglobin of 6.4, decreased from previous of 15 in 2020.  History is not consistent with acute GI bleed, and Hemoccult obtained with yellow-brown stool.  He has elevated MCV, low B12, consistent with likely vitamin deficiency anemia  CT abdomen pelvis was obtained which showed evidence of small bowel obstruction related to infraumbilical hernia, adjacent bilobed supraumbilical hernia containing transverse colon without obstruction, right inguinal hernia appearing to contain nonobstructed or inflamed small bowel.  Consulted general surgery, Dr. Freida Busman regarding findings related to small bowel obstruction and hernia.  Will admit to hospitalist service for continued care.  Consulted patient and ordered 1  unit of blood for anemia.   Final Clinical Impression(s) / ED Diagnoses Final diagnoses:  Hernia of abdominal cavity  SBO (small bowel obstruction) (HCC)  Anemia due to vitamin B12 deficiency, unspecified B12 deficiency type    Rx / DC Orders ED Discharge Orders     None        Alvira Monday, MD 10/13/20 0100

## 2020-10-12 NOTE — ED Notes (Signed)
Partner Deanna Artis [phone] would like an update

## 2020-10-12 NOTE — ED Provider Notes (Signed)
Emergency Medicine Provider Triage Evaluation Note  Logan Nelson , a 55 y.o. male  was evaluated in triage.  Pt complains of hernia problem.  This has been worsening for the past four days.  He says the pain is worse.  Last BM was today.  He reports less appetite than usual.   Review of Systems  Positive: Abdominal pain, hernia into testicle Negative:   Physical Exam  BP 114/78 (BP Location: Right Arm)   Pulse 72   Temp 98.3 F (36.8 C) (Oral)   Resp 18   SpO2 100%  Gen:   Awake, no distress   Resp:  Normal effort  MSK:   Moves extremities without difficulty  Other:  Abdomen is making audible sounds  Medical Decision Making  Medically screening exam initiated at 7:36 PM.  Appropriate orders placed.  Logan Nelson was informed that the remainder of the evaluation will be completed by another provider, this initial triage assessment does not replace that evaluation, and the importance of remaining in the ED until their evaluation is complete.  Note: Portions of this report may have been transcribed using voice recognition software. Every effort was made to ensure accuracy; however, inadvertent computerized transcription errors may be present    Logan Nelson 10/12/20 1940    Linwood Dibbles, MD 10/13/20 2124

## 2020-10-13 ENCOUNTER — Inpatient Hospital Stay (HOSPITAL_COMMUNITY): Payer: Self-pay

## 2020-10-13 DIAGNOSIS — I10 Essential (primary) hypertension: Secondary | ICD-10-CM

## 2020-10-13 DIAGNOSIS — K56609 Unspecified intestinal obstruction, unspecified as to partial versus complete obstruction: Secondary | ICD-10-CM

## 2020-10-13 DIAGNOSIS — D519 Vitamin B12 deficiency anemia, unspecified: Secondary | ICD-10-CM

## 2020-10-13 LAB — POC OCCULT BLOOD, ED: Fecal Occult Bld: NEGATIVE

## 2020-10-13 LAB — COMPREHENSIVE METABOLIC PANEL
ALT: 43 U/L (ref 0–44)
AST: 97 U/L — ABNORMAL HIGH (ref 15–41)
Albumin: 3.3 g/dL — ABNORMAL LOW (ref 3.5–5.0)
Alkaline Phosphatase: 88 U/L (ref 38–126)
Anion gap: 6 (ref 5–15)
BUN: 26 mg/dL — ABNORMAL HIGH (ref 6–20)
CO2: 22 mmol/L (ref 22–32)
Calcium: 8.6 mg/dL — ABNORMAL LOW (ref 8.9–10.3)
Chloride: 105 mmol/L (ref 98–111)
Creatinine, Ser: 1.09 mg/dL (ref 0.61–1.24)
GFR, Estimated: >60 mL/min (ref >60)
Glucose, Bld: 84 mg/dL (ref 70–99)
Potassium: 3.8 mmol/L (ref 3.5–5.1)
Sodium: 133 mmol/L — ABNORMAL LOW (ref 135–145)
Total Bilirubin: 0.6 mg/dL (ref 0.3–1.2)
Total Protein: 6.2 g/dL — ABNORMAL LOW (ref 6.5–8.1)

## 2020-10-13 LAB — CBC
HCT: 19.1 % — ABNORMAL LOW (ref 39.0–52.0)
Hemoglobin: 6.6 g/dL — CL (ref 13.0–17.0)
MCH: 40 pg — ABNORMAL HIGH (ref 26.0–34.0)
MCHC: 34.6 g/dL (ref 30.0–36.0)
MCV: 115.8 fL — ABNORMAL HIGH (ref 80.0–100.0)
Platelets: 81 10*3/uL — ABNORMAL LOW (ref 150–400)
RBC: 1.65 MIL/uL — ABNORMAL LOW (ref 4.22–5.81)
RDW: 22.5 % — ABNORMAL HIGH (ref 11.5–15.5)
WBC: 3.9 10*3/uL — ABNORMAL LOW (ref 4.0–10.5)
nRBC: 0.5 % — ABNORMAL HIGH (ref 0.0–0.2)

## 2020-10-13 LAB — HEMOGLOBIN AND HEMATOCRIT, BLOOD
HCT: 23.2 % — ABNORMAL LOW (ref 39.0–52.0)
Hemoglobin: 8.2 g/dL — ABNORMAL LOW (ref 13.0–17.0)

## 2020-10-13 LAB — RESP PANEL BY RT-PCR (FLU A&B, COVID) ARPGX2
Influenza A by PCR: NEGATIVE
Influenza B by PCR: NEGATIVE
SARS Coronavirus 2 by RT PCR: NEGATIVE

## 2020-10-13 LAB — HIV ANTIBODY (ROUTINE TESTING W REFLEX): HIV Screen 4th Generation wRfx: NONREACTIVE

## 2020-10-13 LAB — PREPARE RBC (CROSSMATCH)

## 2020-10-13 IMAGING — DX DG CHEST 1V PORT
1 series · 1 of 1 positions shown · non-contrast
Comparison: [DATE]

CLINICAL DATA: Hemoptysis

EXAM:
PORTABLE CHEST 1 VIEW

[chest]
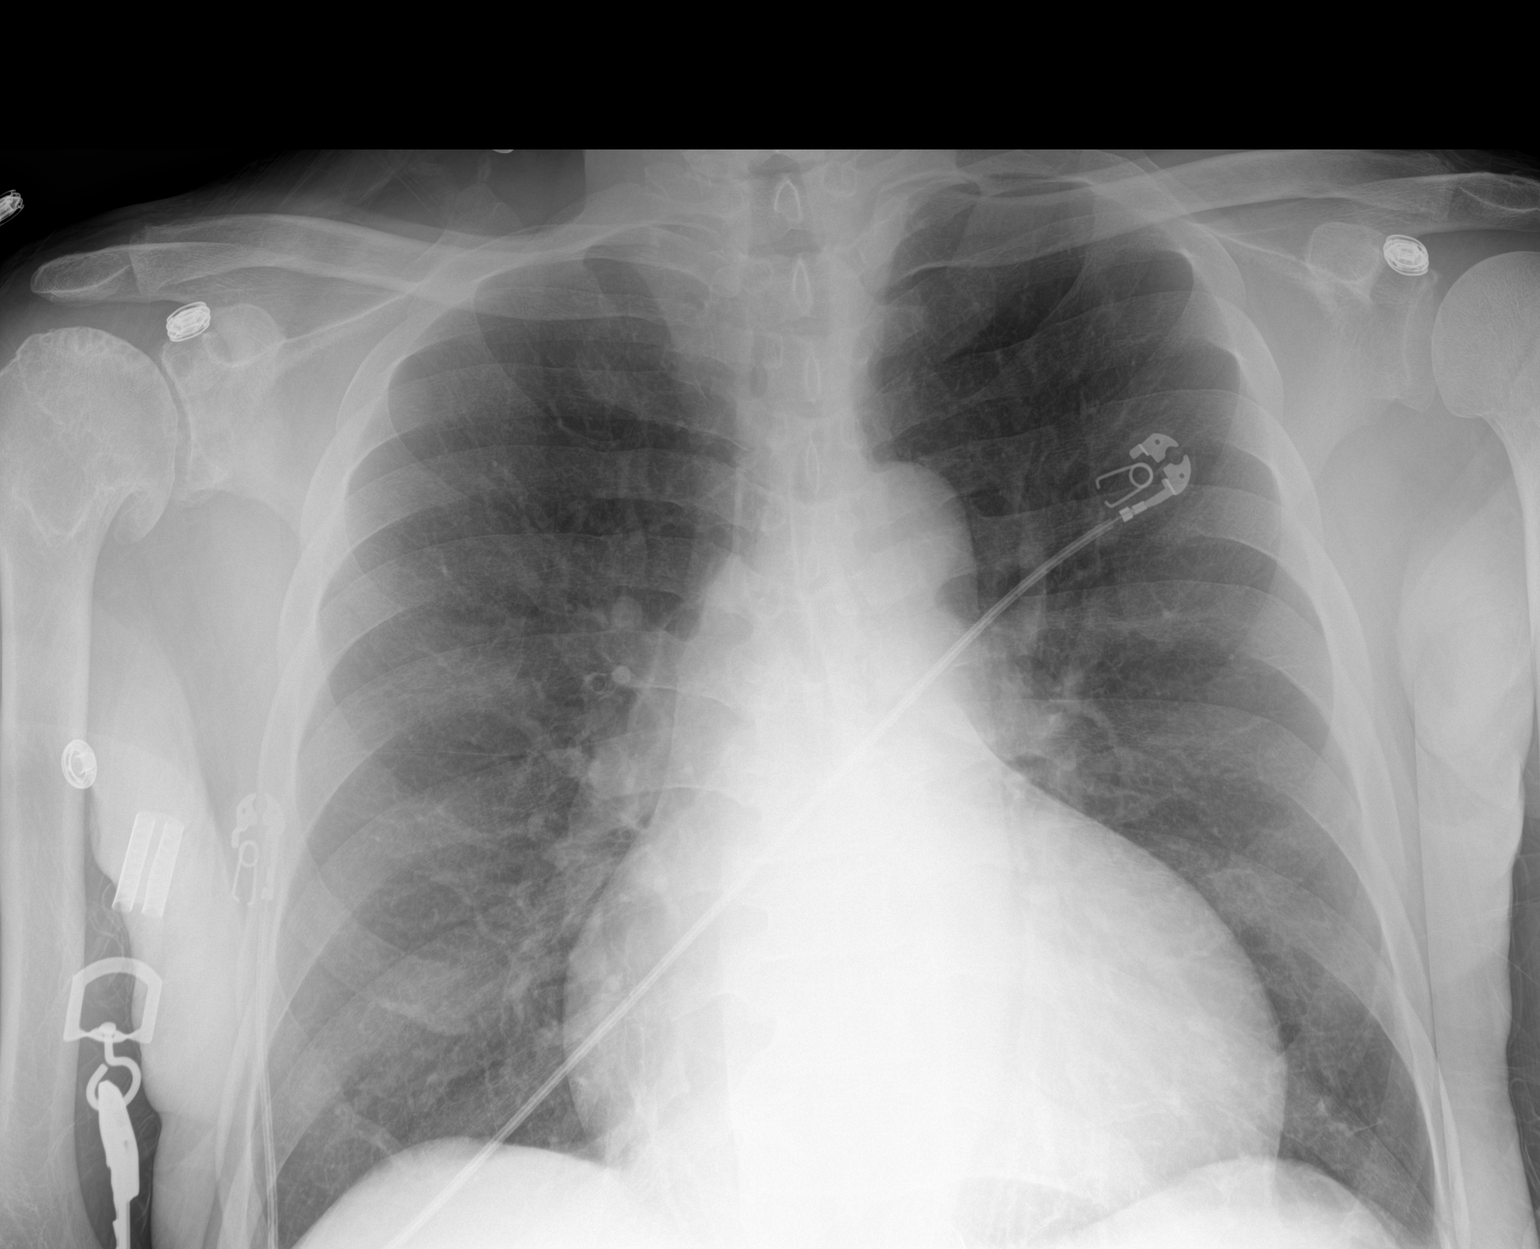

[1 of 1 positions shown; findings below may reference images not displayed]

FINDINGS: Unchanged mild cardiomegaly. Interval improvement of pulmonary
vascular congestion. Lungs are clear. Advanced degenerative changes
of the right glenohumeral joint.
IMPRESSION: Unchanged mild cardiomegaly.

Interval improvement of pulmonary vascular congestion.

## 2020-10-13 MED ORDER — ONDANSETRON HCL 4 MG/2ML IJ SOLN: 4.0000 mg | Freq: Four times a day (QID) | INTRAMUSCULAR | Status: DC | PRN

## 2020-10-13 MED ORDER — FUROSEMIDE 10 MG/ML IJ SOLN: 20.0000 mg | Freq: Once | INTRAMUSCULAR | Status: DC

## 2020-10-13 MED ORDER — LACTATED RINGERS IV SOLN: INTRAVENOUS | Status: AC

## 2020-10-13 MED ORDER — ALBUTEROL SULFATE (2.5 MG/3ML) 0.083% IN NEBU: 2.5000 mg | INHALATION_SOLUTION | Freq: Four times a day (QID) | RESPIRATORY_TRACT | Status: DC | PRN

## 2020-10-13 MED ORDER — DIATRIZOATE MEGLUMINE & SODIUM 66-10 % PO SOLN
90.0000 mL | Freq: Once | ORAL | Status: AC
  Administered 2020-10-13: 90 mL via NASOGASTRIC
  Filled 2020-10-13: qty 90

## 2020-10-13 MED ORDER — SODIUM CHLORIDE 0.9% IV SOLUTION: Freq: Once | INTRAVENOUS | Status: AC

## 2020-10-13 MED ORDER — ACETAMINOPHEN 325 MG PO TABS
650.0000 mg | ORAL_TABLET | Freq: Once | ORAL | Status: AC
  Administered 2020-10-13: 650 mg via ORAL
  Filled 2020-10-13: qty 2

## 2020-10-13 MED ORDER — CYANOCOBALAMIN 1000 MCG/ML IJ SOLN
1000.0000 ug | Freq: Once | INTRAMUSCULAR | Status: AC
  Administered 2020-10-13: 1000 ug via INTRAMUSCULAR
  Filled 2020-10-13 (×2): qty 1

## 2020-10-13 MED ORDER — MORPHINE SULFATE (PF) 2 MG/ML IV SOLN
1.0000 mg | INTRAVENOUS | Status: DC | PRN
  Administered 2020-10-13 – 2020-10-15 (×3): 1 mg via INTRAVENOUS
  Filled 2020-10-13 (×3): qty 1

## 2020-10-13 MED ORDER — ONDANSETRON HCL 4 MG PO TABS: 4.0000 mg | ORAL_TABLET | Freq: Four times a day (QID) | ORAL | Status: DC | PRN

## 2020-10-13 MED ORDER — DIPHENHYDRAMINE HCL 50 MG/ML IJ SOLN
25.0000 mg | Freq: Once | INTRAMUSCULAR | Status: AC
  Administered 2020-10-13: 25 mg via INTRAVENOUS
  Filled 2020-10-13: qty 1

## 2020-10-13 MED ORDER — ACETAMINOPHEN 325 MG PO TABS
650.0000 mg | ORAL_TABLET | Freq: Four times a day (QID) | ORAL | Status: DC | PRN
  Administered 2020-10-14: 650 mg via ORAL
  Filled 2020-10-13: qty 2

## 2020-10-13 MED ORDER — CYANOCOBALAMIN 1000 MCG/ML IJ SOLN
1000.0000 ug | Freq: Every day | INTRAMUSCULAR | Status: DC
  Administered 2020-10-13 – 2020-10-15 (×3): 1000 ug via INTRAMUSCULAR
  Filled 2020-10-13 (×3): qty 1

## 2020-10-13 NOTE — ED Notes (Signed)
Dr. Jerral Ralph aware that pt keeps refusing NG tube. Says just to keep documenting it. He said to let him know if he has nausea/vomiting.

## 2020-10-13 NOTE — Progress Notes (Signed)
PROGRESS NOTE        PATIENT DETAILS Name: Logan Nelson Age: 55 y.o. Sex: male Date of Birth: 03-15-1965 Admit Date: 10/12/2020 Admitting Physician Charlsie Quest, MD [id number], No Pcp Per (Inactive)  Brief Narrative: Patient is a 55 y.o. male with hx of HTN, chronic ventral incisional hernia-presented with nausea/vomiting/abdominal pain-he was found to have SBO and a hemoglobin of 6.4 subsequently admitted to the hospitalist service  Significant events: 9/5>> admit for SBO with a hemoglobin of 6.4.  Significant studies: 9/5>> vitamin B12:<50 9/5>> CT abdomen/pelvis: SBO related to infraumbilical hernia 9/6>> CXR: No pneumonia-mild pulmonary congestion.  Antimicrobial therapy: None  Microbiology data: 9/5>> COVID/influenza PCR: Negative  Procedures : None  Consults: General surgery  DVT Prophylaxis : SCDs Start: 10/13/20 0108   Subjective: No vomiting-lying comfortably in bed.  Refused NG tube last night.   Assessment/Plan: SBO: Remains unchanged overnight-no flatus or BM yet.  Abdomen exam is benign.  Refused NG tube decompression.  Keep n.p.o.-other supportive care-await further recommendations from general surgery.  Macrocytic anemia due to severe vitamin B12 deficiency: Probably nutritional-given prior history of bowel resection.  Hemoglobin still at 6.6 even after 1 unit of PRBC transfusion-we will go ahead and transfuse a second unit today.  Have started vitamin B12 supplementation.  Leukopenia/thrombocytopenia: Likely due to vitamin B12 deficiency-follow CBC.  HTN: BP stable-antihypertensives on hold.  Resume when able.  Obesity: Estimated body mass index is 32.74 kg/m as calculated from the following:   Height as of this encounter: 5\' 6"  (1.676 m).   Weight as of this encounter: 92 kg.    Diet: Diet Order             Diet NPO time specified Except for: Ice Chips  Diet effective now                    Code  Status: Full code   Family Communication: None at bedside  Disposition Plan: Status is: Inpatient  Remains inpatient appropriate because:Inpatient level of care appropriate due to severity of illness  Dispo: The patient is from: Home              Anticipated d/c is to: Home              Patient currently is not medically stable to d/c.   Difficult to place patient No    Barriers to Discharge: SBO-severe symptomatic anemia-on IVF/NPO.  Not yet stable for discharge as bowel function has not come back yet.  Antimicrobial agents: Anti-infectives (From admission, onward)    None        Time spent: 35 minutes-Greater than 50% of this time was spent in counseling, explanation of diagnosis, planning of further management, and coordination of care.  MEDICATIONS: Scheduled Meds:  cyanocobalamin  1,000 mcg Intramuscular Daily   Continuous Infusions:  lactated ringers 125 mL/hr at 10/13/20 0448   PRN Meds:.morphine injection, ondansetron **OR** ondansetron (ZOFRAN) IV   PHYSICAL EXAM: Vital signs: Vitals:   10/13/20 0600 10/13/20 0700 10/13/20 0800 10/13/20 1100  BP: 96/61 91/60 102/70 103/70  Pulse: (!) 51 (!) 54 (!) 49 (!) 52  Resp:   18 18  Temp:      TempSrc:      SpO2: 96% 99% 99% 100%  Weight:      Height:  Filed Weights   10/12/20 1937  Weight: 92 kg   Body mass index is 32.74 kg/m.   Gen Exam:Alert awake-not in any distress HEENT:atraumatic, normocephalic Chest: B/L clear to auscultation anteriorly CVS:S1S2 regular Abdomen:soft-and nondistended-ventral hernia in place. Extremities:no edema Neurology: Non focal Skin: no rash  I have personally reviewed following labs and imaging studies  LABORATORY DATA: CBC: Recent Labs  Lab 10/12/20 1944 10/13/20 0552  WBC 3.8* 3.9*  NEUTROABS 1.6*  --   HGB 6.4* 6.6*  HCT 18.9* 19.1*  MCV 121.9* 115.8*  PLT 100* 81*    Basic Metabolic Panel: Recent Labs  Lab 10/12/20 1944 10/13/20 0552  NA  133* 133*  K 3.8 3.8  CL 105 105  CO2 21* 22  GLUCOSE 96 84  BUN 25* 26*  CREATININE 1.26* 1.09  CALCIUM 8.9 8.6*    GFR: Estimated Creatinine Clearance: 79.4 mL/min (by C-G formula based on SCr of 1.09 mg/dL).  Liver Function Tests: Recent Labs  Lab 10/12/20 1944 10/13/20 0552  AST 116* 97*  ALT 48* 43  ALKPHOS 90 88  BILITOT 0.8 0.6  PROT 6.8 6.2*  ALBUMIN 3.7 3.3*   Recent Labs  Lab 10/12/20 1944  LIPASE 23   No results for input(s): AMMONIA in the last 168 hours.  Coagulation Profile: No results for input(s): INR, PROTIME in the last 168 hours.  Cardiac Enzymes: No results for input(s): CKTOTAL, CKMB, CKMBINDEX, TROPONINI in the last 168 hours.  BNP (last 3 results) No results for input(s): PROBNP in the last 8760 hours.  Lipid Profile: No results for input(s): CHOL, HDL, LDLCALC, TRIG, CHOLHDL, LDLDIRECT in the last 72 hours.  Thyroid Function Tests: No results for input(s): TSH, T4TOTAL, FREET4, T3FREE, THYROIDAB in the last 72 hours.  Anemia Panel: Recent Labs    10/12/20 2204  VITAMINB12 <50*  FOLATE 18.5  FERRITIN 1,318*  TIBC 182*  IRON 148  RETICCTPCT 0.7    Urine analysis:    Component Value Date/Time   COLORURINE AMBER (A) 01/07/2015 1617   APPEARANCEUR CLEAR 01/07/2015 1617   LABSPEC 1.034 (H) 01/07/2015 1617   PHURINE 5.5 01/07/2015 1617   GLUCOSEU NEGATIVE 01/07/2015 1617   HGBUR NEGATIVE 01/07/2015 1617   BILIRUBINUR SMALL (A) 01/07/2015 1617   KETONESUR 15 (A) 01/07/2015 1617   PROTEINUR NEGATIVE 01/07/2015 1617   NITRITE NEGATIVE 01/07/2015 1617   LEUKOCYTESUR TRACE (A) 01/07/2015 1617    Sepsis Labs: Lactic Acid, Venous No results found for: LATICACIDVEN  MICROBIOLOGY: Recent Results (from the past 240 hour(s))  Resp Panel by RT-PCR (Flu A&B, Covid) Nasopharyngeal Swab     Status: None   Collection Time: 10/12/20 11:59 PM   Specimen: Nasopharyngeal Swab; Nasopharyngeal(NP) swabs in vial transport medium  Result  Value Ref Range Status   SARS Coronavirus 2 by RT PCR NEGATIVE NEGATIVE Final    Comment: (NOTE) SARS-CoV-2 target nucleic acids are NOT DETECTED.  The SARS-CoV-2 RNA is generally detectable in upper respiratory specimens during the acute phase of infection. The lowest concentration of SARS-CoV-2 viral copies this assay can detect is 138 copies/mL. A negative result does not preclude SARS-Cov-2 infection and should not be used as the sole basis for treatment or other patient management decisions. A negative result may occur with  improper specimen collection/handling, submission of specimen other than nasopharyngeal swab, presence of viral mutation(s) within the areas targeted by this assay, and inadequate number of viral copies(<138 copies/mL). A negative result must be combined with clinical observations, patient history, and  epidemiological information. The expected result is Negative.  Fact Sheet for Patients:  BloggerCourse.com  Fact Sheet for Healthcare Providers:  SeriousBroker.it  This test is no t yet approved or cleared by the [place] FDA and  has been authorized for detection and/or diagnosis of SARS-CoV-2 by FDA under an Emergency Use Authorization (EUA). This EUA will remain  in effect (meaning this test can be used) for the duration of the COVID-19 declaration under Section 564(b)(1) of the Act, 21 U.S.C.section 360bbb-3(b)(1), unless the authorization is terminated  or revoked sooner.       Influenza A by PCR NEGATIVE NEGATIVE Final   Influenza B by PCR NEGATIVE NEGATIVE Final    Comment: (NOTE) The Xpert Xpress SARS-CoV-2/FLU/RSV plus assay is intended as an aid in the diagnosis of influenza from Nasopharyngeal swab specimens and should not be used as a sole basis for treatment. Nasal washings and aspirates are unacceptable for Xpert Xpress SARS-CoV-2/FLU/RSV testing.  Fact Sheet for  Patients: BloggerCourse.com  Fact Sheet for Healthcare Providers: SeriousBroker.it  This test is not yet approved or cleared by the [place] FDA and has been authorized for detection and/or diagnosis of SARS-CoV-2 by FDA under an Emergency Use Authorization (EUA). This EUA will remain in effect (meaning this test can be used) for the duration of the COVID-19 declaration under Section 564(b)(1) of the Act, 21 U.S.C. section 360bbb-3(b)(1), unless the authorization is terminated or revoked.  Performed at [facility], [address], [place], [place] [address]     RADIOLOGY STUDIES/RESULTS: CT ABDOMEN PELVIS W CONTRAST  Result Date: 10/12/2020 CLINICAL DATA:  Acute abdominal pain.  Hernia.  Query obstruction. EXAM: CT ABDOMEN AND PELVIS WITH CONTRAST TECHNIQUE: Multidetector CT imaging of the abdomen and pelvis was performed using the standard protocol following bolus administration of intravenous contrast. CONTRAST:  89mL OMNIPAQUE IOHEXOL 350 MG/ML SOLN COMPARISON:  CT 01/07/2015 FINDINGS: Lower chest: Mild heterogeneous pulmonary parenchyma at the lung bases. No acute airspace disease or pleural effusion. Upper normal heart size. Hepatobiliary: Diffusely decreased hepatic density typical of steatosis. Subcapsular cyst in the medial right hepatic lobe. No suspicious liver lesion. Gallbladder physiologically distended, no calcified stone. No biliary dilatation. Pancreas: Grossly negative, partially obscured by adjacent bowel loops. No evidence of pancreatic inflammation. Spleen: Normal in size without focal abnormality. Adrenals/Urinary Tract: No adrenal nodule. No hydronephrosis or perinephric edema. Homogeneous renal enhancement with symmetric excretion on delayed phase imaging. No evidence of focal renal abnormality or stone. Urinary bladder is partially distended without wall thickening. Stomach/Bowel: Mid and upper abdominal small  bowel are diffusely dilated and fluid-filled. Complex umbilical hernia with a bilobed appearance, bilobed hernia defect both above and separate hernia below below the umbilicus. The more superior hernia defect involves short segment of transverse colon, without evidence of colonic obstruction. There is fluid within the cecum and proximal transverse colon, but no abnormal distension, in the distal small bowel is decompressed. The more inferior hernia contains a loop of small bowel, with the exiting small bowel being decompressed, likely cause of obstruction. Small bowel proximal to this is diffusely dilated and fluid-filled. The stomach is distended with ingested contents. There is also a right inguinal hernia, not entirely included in the field of view, however appears to contain nonobstructed or inflamed small bowel. The terminal ileum is difficult to define. Transverse colon is nondistended. Fluid within the sigmoid colon. No evidence of bowel pneumatosis, significant mesenteric edema, or inflammation. No perforation. Appendix not visualized. Vascular/Lymphatic: Normal caliber abdominal aorta.  Patent portal vein. No abdominopelvic adenopathy. Reproductive: Prominent prostate gland spans 5.2 cm transverse. Other: Abdominal wall hernias as described.  No free air or ascites. Musculoskeletal: L5-S1 degenerative disc disease. Areas of endplate sclerosis involving the lower thoracic and lumbar spine are felt to be degenerative. There are no acute or suspicious osseous abnormalities. IMPRESSION: 1. Small bowel obstruction related to infraumbilical hernia. No evidence of ischemia or perforation. 2. There is an adjacent bilobed supraumbilical hernia containing transverse colon. No obstruction related to this hernia. 3. Right inguinal hernia, not entirely included in the field of view, however appears to contain nonobstructed or inflamed small bowel. 4. Hepatic steatosis. 5. Enlarged prostate gland. Electronically Signed    By: Narda RutherfordMelanie  Sanford M.D.   On: 10/12/2020 23:10   DG CHEST PORT 1 VIEW  Result Date: 10/13/2020 CLINICAL DATA:  Hemoptysis EXAM: PORTABLE CHEST 1 VIEW COMPARISON:  07/10/2019 FINDINGS: Unchanged mild cardiomegaly. Interval improvement of pulmonary vascular congestion. Lungs are clear. Advanced degenerative changes of the right glenohumeral joint. IMPRESSION: Unchanged mild cardiomegaly. Interval improvement of pulmonary vascular congestion. Electronically Signed   By: Acquanetta BellingFarhaan  Mir M.D.   On: 10/13/2020 07:54     LOS: 0 days   Jeoffrey MassedShanker Antania Hoefling, MD  [facility]    To contact the attending provider between 7A-7P or the covering provider during after hours 7P-7A, please log into the web site [email or web address] and access using universal [place] password for that web site. If you do not have the password, please call the hospital operator.  10/13/2020, 1:41 PM

## 2020-10-13 NOTE — ED Notes (Signed)
Pt aware he needs to all when he's finished with gastrografin.

## 2020-10-13 NOTE — ED Notes (Signed)
Pt ambulatory to restroom. Steady on feet. Denies further needs. 

## 2020-10-13 NOTE — ED Notes (Signed)
Report given to Jennifer, RN

## 2020-10-13 NOTE — ED Notes (Signed)
I called xray to let them know that pt finished his gastrograf. They will xray him in 8 hours.

## 2020-10-13 NOTE — Progress Notes (Signed)
Received pt from ED A/O x 4. Oriented to room and call bell. Made aware that he's NPO and can only have ice chips and sips of water with medicine. Refuses NGT, as per ED Rn report and Surgery Md's aware. Denies nausea and pain rt now. Will continue to monitor pt.

## 2020-10-13 NOTE — H&P (Signed)
History and Physical    Logan Nelson WVP:[id number] DOB: Jun 19, 1965 DOA: 10/12/2020  PCP: Patient, No Pcp Per (Inactive)  Patient coming from: Home  I have personally briefly reviewed patient's old medical records in [place]  Chief Complaint: Abdominal pain  HPI: Logan Nelson is a 55 y.o. male with medical history significant for hypertension, tobacco use, prior colectomy likely for diverticulitis with subsequent takedown who presented to the ED for evaluation of abdominal pain.  Patient reports chronic abdominal pain related to his hernias.  Pain worsened acutely, described as all over his abdomen and related to his hernias.  He is also noted swelling in his right groin.  He has been nauseous and threw up day prior to admission.  He did have a reported normal-appearing bowel movement in the morning and is still passing gas.  He denies any obvious bleeding.  He reports smoking 2-3 cigarettes/day.  He denies any alcohol use.  He denies any illicit drug use.  ED Course:  Initial vitals showed BP 114/78, pulse 72, RR 18, temp 98.3 F, SPO2 100% on room air.  Labs show hemoglobin 6.4 (previously 15.2 on 01/23/2019), WBC 3.8, hematocrit 18.9, MCV 121.9, RDW 19, platelets 100,000, lipase 23, sodium 133, potassium 3.8, bicarb 21, BUN 25, creatinine 1.26 (previously 0.8 12 January 2019), AST 116, ALT 40, alk phos 90, total bilirubin 0.8.  Vitamin B12 <50, folate 18.5, iron 148, TIBC 182, saturation ratios 81%, ferritin 1318.  SARS-CoV-2 PCR in process.  CT abdomen/pelvis with contrast showed small bowel obstruction felt related pain from umbilical hernia without evidence of ischemia or perforation.  Adjacent bilobed supraumbilical hernia containing transverse colon seen without obstruction related to this hernia.  Right inguinal hernia also noted and appears to contain nonobstructed or inflamed small bowel.  Hepatic steatosis noted.  Patient was given 1 L normal saline, IV  Zofran, IV morphine, in order to receive 1 unit PRBC transfusion.  General surgery was consulted and evaluated patient.  Obstruction felt due to adhesions and not hernia and no surgical intervention planned at this time.  The hospitalist service was consulted to admit for further evaluation and management.  Review of Systems: All systems reviewed and are negative except as documented in history of present illness above.   Past Medical History:  Diagnosis Date   Arthritis    Hernia of abdominal wall    Hypertension     Past Surgical History:  Procedure Laterality Date   ABDOMINAL SURGERY      Social History:  reports that he has quit smoking. His smoking use included cigarettes. He has never used smokeless tobacco. He reports current alcohol use. No history on file for drug use.  No Known Allergies  No family history on file.   Prior to Admission medications   Medication Sig Start Date End Date Taking? Authorizing Provider  acetaminophen (TYLENOL) 325 MG tablet Take 2 tablets (650 mg total) by mouth every 6 (six) hours as needed. Patient not taking: Reported on 10/13/2020 10/10/18   Hedges, Dellis Filbert, PA-C  lisinopril-hydrochlorothiazide (PRINZIDE,ZESTORETIC) 10-12.5 MG tablet Take 1 tablet by mouth daily. Patient not taking: Reported on 10/13/2020 07/01/15   Davonna Belling, MD  naproxen (NAPROSYN) 375 MG tablet Take 1 tablet (375 mg total) by mouth 2 (two) times daily as needed. Patient not taking: Reported on 10/13/2020 07/10/19   Elnora Morrison, MD    Physical Exam: Vitals:   10/12/20 2145 10/12/20 2200 10/13/20 0000 10/13/20 0015  BP: 110/75 120/73 106/69  108/62  Pulse: 80 64 71 61  Resp:  18 17 17   Temp:      TempSrc:      SpO2: 100% 98% 100% 96%  Weight:      Height:       Constitutional: Resting supine in bed, NAD, calm, comfortable Eyes: PERRL, lids and conjunctivae normal ENMT: Mucous membranes are dry. Posterior pharynx clear of any exudate or lesions.poor dentition.   Neck: normal, supple, no masses. Respiratory: clear to auscultation bilaterally, no wheezing, no crackles. Normal respiratory effort. No accessory muscle use.  Cardiovascular: Regular rate and rhythm, no murmurs / rubs / gallops. No extremity edema. 2+ pedal pulses. Abdomen: Soft, nontender, diminished bowel sounds.  Multiple hernias near the umbilicus which are soft and without open wounds or tender to palpation.  Large right inguinal hernia also present which is soft. Musculoskeletal: no clubbing / cyanosis. No joint deformity upper and lower extremities. Good ROM, no contractures. Normal muscle tone.  Skin: no rashes, lesions, ulcers. No induration Neurologic: CN 2-12 grossly intact. Sensation intact. Strength 5/5 in all 4.  Psychiatric: Alert and oriented x 3. Normal mood.   Labs on Admission: I have personally reviewed following labs and imaging studies  CBC: Recent Labs  Lab 10/12/20 1944  WBC 3.8*  NEUTROABS 1.6*  HGB 6.4*  HCT 18.9*  MCV 121.9*  PLT 701*   Basic Metabolic Panel: Recent Labs  Lab 10/12/20 1944  NA 133*  K 3.8  CL 105  CO2 21*  GLUCOSE 96  BUN 25*  CREATININE 1.26*  CALCIUM 8.9   GFR: Estimated Creatinine Clearance: 68.7 mL/min (A) (by C-G formula based on SCr of 1.26 mg/dL (H)). Liver Function Tests: Recent Labs  Lab 10/12/20 1944  AST 116*  ALT 48*  ALKPHOS 90  BILITOT 0.8  PROT 6.8  ALBUMIN 3.7   Recent Labs  Lab 10/12/20 1944  LIPASE 23   No results for input(s): AMMONIA in the last 168 hours. Coagulation Profile: No results for input(s): INR, PROTIME in the last 168 hours. Cardiac Enzymes: No results for input(s): CKTOTAL, CKMB, CKMBINDEX, TROPONINI in the last 168 hours. BNP (last 3 results) No results for input(s): PROBNP in the last 8760 hours. HbA1C: No results for input(s): HGBA1C in the last 72 hours. CBG: No results for input(s): GLUCAP in the last 168 hours. Lipid Profile: No results for input(s): CHOL, HDL,  LDLCALC, TRIG, CHOLHDL, LDLDIRECT in the last 72 hours. Thyroid Function Tests: No results for input(s): TSH, T4TOTAL, FREET4, T3FREE, THYROIDAB in the last 72 hours. Anemia Panel: Recent Labs    10/12/20 2204  VITAMINB12 <50*  FOLATE 18.5  FERRITIN 1,318*  TIBC 182*  IRON 148  RETICCTPCT 0.7   Urine analysis:    Component Value Date/Time   COLORURINE AMBER (A) 01/07/2015 1617   APPEARANCEUR CLEAR 01/07/2015 1617   LABSPEC 1.034 (H) 01/07/2015 1617   PHURINE 5.5 01/07/2015 1617   GLUCOSEU NEGATIVE 01/07/2015 1617   HGBUR NEGATIVE 01/07/2015 1617   BILIRUBINUR SMALL (A) 01/07/2015 1617   KETONESUR 15 (A) 01/07/2015 1617   PROTEINUR NEGATIVE 01/07/2015 1617   NITRITE NEGATIVE 01/07/2015 1617   LEUKOCYTESUR TRACE (A) 01/07/2015 1617    Radiological Exams on Admission: CT ABDOMEN PELVIS W CONTRAST  Result Date: 10/12/2020 CLINICAL DATA:  Acute abdominal pain.  Hernia.  Query obstruction. EXAM: CT ABDOMEN AND PELVIS WITH CONTRAST TECHNIQUE: Multidetector CT imaging of the abdomen and pelvis was performed using the standard protocol following bolus administration of intravenous  contrast. CONTRAST:  81m OMNIPAQUE IOHEXOL 350 MG/ML SOLN COMPARISON:  CT 01/07/2015 FINDINGS: Lower chest: Mild heterogeneous pulmonary parenchyma at the lung bases. No acute airspace disease or pleural effusion. Upper normal heart size. Hepatobiliary: Diffusely decreased hepatic density typical of steatosis. Subcapsular cyst in the medial right hepatic lobe. No suspicious liver lesion. Gallbladder physiologically distended, no calcified stone. No biliary dilatation. Pancreas: Grossly negative, partially obscured by adjacent bowel loops. No evidence of pancreatic inflammation. Spleen: Normal in size without focal abnormality. Adrenals/Urinary Tract: No adrenal nodule. No hydronephrosis or perinephric edema. Homogeneous renal enhancement with symmetric excretion on delayed phase imaging. No evidence of focal renal  abnormality or stone. Urinary bladder is partially distended without wall thickening. Stomach/Bowel: Mid and upper abdominal small bowel are diffusely dilated and fluid-filled. Complex umbilical hernia with a bilobed appearance, bilobed hernia defect both above and separate hernia below below the umbilicus. The more superior hernia defect involves short segment of transverse colon, without evidence of colonic obstruction. There is fluid within the cecum and proximal transverse colon, but no abnormal distension, in the distal small bowel is decompressed. The more inferior hernia contains a loop of small bowel, with the exiting small bowel being decompressed, likely cause of obstruction. Small bowel proximal to this is diffusely dilated and fluid-filled. The stomach is distended with ingested contents. There is also a right inguinal hernia, not entirely included in the field of view, however appears to contain nonobstructed or inflamed small bowel. The terminal ileum is difficult to define. Transverse colon is nondistended. Fluid within the sigmoid colon. No evidence of bowel pneumatosis, significant mesenteric edema, or inflammation. No perforation. Appendix not visualized. Vascular/Lymphatic: Normal caliber abdominal aorta. Patent portal vein. No abdominopelvic adenopathy. Reproductive: Prominent prostate gland spans 5.2 cm transverse. Other: Abdominal wall hernias as described.  No free air or ascites. Musculoskeletal: L5-S1 degenerative disc disease. Areas of endplate sclerosis involving the lower thoracic and lumbar spine are felt to be degenerative. There are no acute or suspicious osseous abnormalities. IMPRESSION: 1. Small bowel obstruction related to infraumbilical hernia. No evidence of ischemia or perforation. 2. There is an adjacent bilobed supraumbilical hernia containing transverse colon. No obstruction related to this hernia. 3. Right inguinal hernia, not entirely included in the field of view, however  appears to contain nonobstructed or inflamed small bowel. 4. Hepatic steatosis. 5. Enlarged prostate gland. Electronically Signed   By: MKeith RakeM.D.   On: 10/12/2020 23:10    EKG: Not performed.  Assessment/Plan Principal Problem:   Small bowel obstruction (HCC) Active Problems:   Incisional hernia   Hypertension   Anemia due to vitamin B12 deficiency   Logan KNilan Iddingsis a 55y.o. male with medical history significant for hypertension, tobacco use, prior colectomy likely for diverticulitis with subsequent takedown who is admitted with SBO.  Small bowel obstruction felt due to adhesions Infraumbilical, supraumbilical, and right inguinal hernias: -General surgery following, continue conservative management -Patient declined NG tube -Keep n.p.o. -Continue IV fluid hydration overnight -IV morphine as needed for pain  Anemia due to severe B12 deficiency: Hemoglobin 6.4 on admission.  Denies any obvious bleeding.  Has associated mild thrombocytopenia and leukopenia.  Likely related to poor nutrition/absorption.  Denies any alcohol use. -To receive 1 unit PRBC transfusion -Give vitamin B12 1000 mcg IM -TOC consult for PCP needs for further outpatient management  Hypertension: Currently stable, holding home lisinopril-HCTZ while NPO.  Tobacco use: Reports smoking 2-3 cigarettes/day.  DVT prophylaxis: SCDs Code Status: Full code, confirmed with  patient on admission Family Communication: Discussed with patient Disposition Plan: From home and likely discharge to home pending clinical progress Consults called: General surgery Level of care: Med-Surg Admission status:  Status is: Inpatient  Remains inpatient appropriate because:IV treatments appropriate due to intensity of illness or inability to take PO and Inpatient level of care appropriate due to severity of illness  Dispo: The patient is from: Home              Anticipated d/c is to: Home              Patient  currently is not medically stable to d/c.   Difficult to place patient No   Zada Finders MD [facility]  If 7PM-7AM, please contact night-coverage [email or web address]  10/13/2020, 1:10 AM

## 2020-10-13 NOTE — Plan of Care (Signed)
  Problem: Education: Goal: Knowledge of General Education information will improve Description: Including pain rating scale, medication(s)/side effects and non-pharmacologic comfort measures Outcome: Progressing   Problem: Activity: Goal: Risk for activity intolerance will decrease Outcome: Progressing   

## 2020-10-13 NOTE — ED Notes (Signed)
Pt AxO x4. Given a blanket because he's cold. Premedicated for blood transfusion. GCS 15. Pt asking for pain medication, aware his HR too low for narcotics. I updated pt's SO. Pt aware he's still NPO. Denies further needs. Pt states he's been passing gas and he's had 2 BM (unwitnessed).

## 2020-10-13 NOTE — ED Notes (Signed)
I stayed with pt for first 15 minutes of blood transfusion. 

## 2020-10-13 NOTE — ED Notes (Signed)
Attempted to give report on pt. They said to call back.

## 2020-10-13 NOTE — ED Notes (Signed)
Admitting team at bedside.

## 2020-10-13 NOTE — ED Notes (Signed)
PA for surgery said to give pt gastrograf PO since he doesn't want the NG tube and denies pain/nausea. She said she will d/c the NG tube order.

## 2020-10-13 NOTE — ED Notes (Signed)
Pt resting in bed comfortably. Aware we're waiting on xray 8 hours after gastrograf was completed. Blood finished. Pt also aware I'll have to draw blood in 2 hours to recheck hgb & hct levels. Resting in bed comfortably. Pt denies further needs.

## 2020-10-13 NOTE — ED Notes (Signed)
Pt attempted to have BM, ambulated to bathroom independently. Pt reports pain and unable to pass stool at this time.

## 2020-10-13 NOTE — ED Notes (Signed)
I introduced myself to pt. Pt AxO x4. GCS 15. Pt asked for ice chips. Eulis Foster, RN said admitting team said pt could have a few so given a small cup. Pt given a new blanket and a blanket for behind his head. Denies further needs.

## 2020-10-13 NOTE — Consult Note (Signed)
Sherrill RaringDarryl Keith Whisenant 12/13/1965  [id number].    Requesting MD: Dr. Dalene SeltzerSchlossman Chief Complaint/Reason for Consult: ventral hernia with small bowel obstruction  HPI:  Mr. Laural BenesJohnson is a 55 year old male with a chronic ventral incisional hernia who presented to the ED today with nausea and vomiting.  He has had a ventral hernia for many years, and previously had a colon resection and later colostomy takedown.  He was previously admitted in 2016 with a small bowel obstruction, which resolved with nonoperative management.  He reports that he has frequent discomfort from his hernia, and for the last few days has been having nausea and vomiting.  He also says that he had a bowel movement this morning.  His labs are significant for hemoglobin of 6.4 but a normal WBC.  CT scan showed multiple hernia defects at the midline containing small bowel and colon, with dilation of the proximal small bowel and stomach.  General surgery was consulted.  ROS: Review of Systems  Constitutional:  Negative for chills and fever.  Respiratory:  Negative for shortness of breath.   Gastrointestinal:  Positive for abdominal pain, nausea and vomiting. Negative for constipation.   No family history on file.  Past Medical History:  Diagnosis Date   Arthritis    Hernia of abdominal wall    Hypertension     Past Surgical History:  Procedure Laterality Date   ABDOMINAL SURGERY      Social History:  reports that he has quit smoking. His smoking use included cigarettes. He has never used smokeless tobacco. He reports current alcohol use. No history on file for drug use.  Allergies: No Known Allergies  (Not in a hospital admission)    Physical Exam: Blood pressure 108/62, pulse 61, temperature 98.3 F (36.8 C), temperature source Oral, resp. rate 17, height 5\' 6"  (1.676 m), weight 92 kg, SpO2 96 %. General: resting comfortably, appears stated age, no apparent distress Neurological: alert and oriented, no focal  deficits, cranial nerves grossly in tact HEENT: normocephalic, atraumatic, oropharynx clear, no scleral icterus CV: regular rate and rhythm, extremities warm and well-perfused Respiratory: normal work of breathing on room air, symmetric chest wall expansion Abdomen: soft, mildly distended, nontender to palpation. Midline surgical scar with multiple hernias near the umbilicus, soft and reducible with no overlying skin changes or tenderness to palpation. GU: Large right inguinal hernia, soft and reducible in the supine position. Extremities: warm and well-perfused, no deformities, moving all extremities spontaneously Psychiatric: normal mood and affect Skin: warm and dry, no jaundice, no rashes or lesions   Results for orders placed or performed during the hospital encounter of 10/12/20 (from the past 48 hour(s))  Comprehensive metabolic panel     Status: Abnormal   Collection Time: 10/12/20  7:44 PM  Result Value Ref Range   Sodium 133 (L) 135 - 145 mmol/L   Potassium 3.8 3.5 - 5.1 mmol/L   Chloride 105 98 - 111 mmol/L   CO2 21 (L) 22 - 32 mmol/L   Glucose, Bld 96 70 - 99 mg/dL    Comment: Glucose reference range applies only to samples taken after fasting for at least 8 hours.   BUN 25 (H) 6 - 20 mg/dL   Creatinine, Ser 8.291.26 (H) 0.61 - 1.24 mg/dL   Calcium 8.9 8.9 - 56.210.3 mg/dL   Total Protein 6.8 6.5 - 8.1 g/dL   Albumin 3.7 3.5 - 5.0 g/dL   AST 130116 (H) 15 - 41 U/L   ALT 48 (  H) 0 - 44 U/L   Alkaline Phosphatase 90 38 - 126 U/L   Total Bilirubin 0.8 0.3 - 1.2 mg/dL   GFR, Estimated >27 >74 mL/min    Comment: (NOTE) Calculated using the CKD-EPI Creatinine Equation (2021)    Anion gap 7 5 - 15    Comment: Performed at [facility], [address] [place] [address], [place], [place] [address]  Lipase, blood     Status: None   Collection Time: 10/12/20  7:44 PM  Result Value Ref Range   Lipase 23 11 - 51 U/L    Comment: Performed at [facility], [address], [place], [place]  [address]  CBC with Differential     Status: Abnormal   Collection Time: 10/12/20  7:44 PM  Result Value Ref Range   WBC 3.8 (L) 4.0 - 10.5 K/uL   RBC 1.55 (L) 4.22 - 5.81 MIL/uL   Hemoglobin 6.4 (LL) 13.0 - 17.0 g/dL    Comment: REPEATED TO VERIFY THIS CRITICAL RESULT HAS VERIFIED AND BEEN CALLED TO K MUMNETT RN BY KYUNG BAEK ON 09 05 2022 AT 2032, AND HAS BEEN READ BACK.     HCT 18.9 (L) 39.0 - 52.0 %   MCV 121.9 (H) 80.0 - 100.0 fL   MCH 41.3 (H) 26.0 - 34.0 pg   MCHC 33.9 30.0 - 36.0 g/dL   RDW 09.4 (H) 70.9 - 62.8 %   Platelets 100 (L) 150 - 400 K/uL    Comment: Immature Platelet Fraction may be clinically indicated, consider ordering this additional test [id number] REPEATED TO VERIFY PLATELET COUNT CONFIRMED BY SMEAR    nRBC 0.8 (H) 0.0 - 0.2 %   Neutrophils Relative % 42 %   Neutro Abs 1.6 (L) 1.7 - 7.7 K/uL   Lymphocytes Relative 52 %   Lymphs Abs 2.0 0.7 - 4.0 K/uL   Monocytes Relative 3 %   Monocytes Absolute 0.1 0.1 - 1.0 K/uL   Eosinophils Relative 2 %   Eosinophils Absolute 0.1 0.0 - 0.5 K/uL   Basophils Relative 0 %   Basophils Absolute 0.0 0.0 - 0.1 K/uL   Immature Granulocytes 1 %   Abs Immature Granulocytes 0.02 0.00 - 0.07 K/uL    Comment: Performed at [facility], [address], [place], [place] [address]  ABO/Rh     Status: None   Collection Time: 10/12/20  7:44 PM  Result Value Ref Range   ABO/RH(D)      O POS Performed at [facility], [address], [place], [place] [address]   Type and screen [facility]     Status: None (Preliminary result)   Collection Time: 10/12/20 10:04 PM  Result Value Ref Range   ABO/RH(D) O POS    Antibody Screen NEG    Sample Expiration      10/15/2020,2359 Performed at [facility], [address] [place], [place] [address]    Unit Number [id number]    Blood Component Type RED CELLS,LR    Unit division 00    Status of Unit ALLOCATED    Transfusion Status OK TO TRANSFUSE     Crossmatch Result Compatible   Prepare RBC (crossmatch)     Status: None   Collection Time: 10/12/20 10:04 PM  Result Value Ref Range   Order Confirmation      ORDER PROCESSED BY BLOOD BANK Performed at [facility], [address], [place], [place] [address]  Vitamin B12     Status: Abnormal   Collection Time: 10/12/20 10:04 PM  Result Value Ref Range   Vitamin B-12 <50 (L) 180 - 914 pg/mL    Comment: (NOTE) This assay is not validated for testing neonatal or myeloproliferative syndrome specimens for Vitamin B12 levels. Performed at [facility], [address], [place], [place] [address]   Folate     Status: None   Collection Time: 10/12/20 10:04 PM  Result Value Ref Range   Folate 18.5 >5.9 ng/mL    Comment: Performed at [facility], [address], [place], [place] [address]  Iron and TIBC     Status: Abnormal   Collection Time: 10/12/20 10:04 PM  Result Value Ref Range   Iron 148 45 - 182 ug/dL   TIBC 098 (L) 119 - 147 ug/dL   Saturation Ratios 81 (H) 17.9 - 39.5 %   UIBC 34 ug/dL    Comment: Performed at [facility], [address] [place], [place] [address]  Ferritin     Status: Abnormal   Collection Time: 10/12/20 10:04 PM  Result Value Ref Range   Ferritin 1,318 (H) 24 - 336 ng/mL    Comment: Performed at [facility], [address] [place], [place] [address]  Reticulocytes     Status: Abnormal   Collection Time: 10/12/20 10:04 PM  Result Value Ref Range   Retic Ct Pct 0.7 0.4 - 3.1 %   RBC. 1.55 (L) 4.22 - 5.81 MIL/uL   Retic Count, Absolute 11.0 (L) 19.0 - 186.0 K/uL   Immature Retic Fract 20.4 (H) 2.3 - 15.9 %    Comment: Performed at [facility], [address], [place], [place] [address]  Resp Panel by RT-PCR (Flu A&B, Covid) Nasopharyngeal Swab     Status: None   Collection Time: 10/12/20 11:59 PM   Specimen: Nasopharyngeal Swab; Nasopharyngeal(NP) swabs in vial transport medium  Result Value Ref Range   SARS  Coronavirus 2 by RT PCR NEGATIVE NEGATIVE    Comment: (NOTE) SARS-CoV-2 target nucleic acids are NOT DETECTED.  The SARS-CoV-2 RNA is generally detectable in upper respiratory specimens during the acute phase of infection. The lowest concentration of SARS-CoV-2 viral copies this assay can detect is 138 copies/mL. A negative result does not preclude SARS-Cov-2 infection and should not be used as the sole basis for treatment or other patient management decisions. A negative result may occur with  improper specimen collection/handling, submission of specimen other than nasopharyngeal swab, presence of viral mutation(s) within the areas targeted by this assay, and inadequate number of viral copies(<138 copies/mL). A negative result must be combined with clinical observations, patient history, and epidemiological information. The expected result is Negative.  Fact Sheet for Patients:  BloggerCourse.com  Fact Sheet for Healthcare Providers:  SeriousBroker.it  This test is no t yet approved or cleared by the [place] FDA and  has been authorized for detection and/or diagnosis of SARS-CoV-2 by FDA under an Emergency Use Authorization (EUA). This EUA will remain  in effect (meaning this test can be used) for the duration of the COVID-19 declaration under Section 564(b)(1) of the Act, 21 U.S.C.section 360bbb-3(b)(1), unless the authorization is terminated  or revoked sooner.       Influenza A by PCR NEGATIVE NEGATIVE   Influenza B by PCR NEGATIVE NEGATIVE    Comment: (NOTE) The Xpert Xpress SARS-CoV-2/FLU/RSV plus assay is intended as an aid in the diagnosis  of influenza from Nasopharyngeal swab specimens and should not be used as a sole basis for treatment. Nasal washings and aspirates are unacceptable for Xpert Xpress SARS-CoV-2/FLU/RSV testing.  Fact Sheet for Patients: BloggerCourse.com  Fact Sheet  for Healthcare Providers: SeriousBroker.it  This test is not yet approved or cleared by the [place] FDA and has been authorized for detection and/or diagnosis of SARS-CoV-2 by FDA under an Emergency Use Authorization (EUA). This EUA will remain in effect (meaning this test can be used) for the duration of the COVID-19 declaration under Section 564(b)(1) of the Act, 21 U.S.C. section 360bbb-3(b)(1), unless the authorization is terminated or revoked.  Performed at [facility], [address] [place], [place] [address]    CT ABDOMEN PELVIS W CONTRAST  Result Date: 10/12/2020 CLINICAL DATA:  Acute abdominal pain.  Hernia.  Query obstruction. EXAM: CT ABDOMEN AND PELVIS WITH CONTRAST TECHNIQUE: Multidetector CT imaging of the abdomen and pelvis was performed using the standard protocol following bolus administration of intravenous contrast. CONTRAST:  51mL OMNIPAQUE IOHEXOL 350 MG/ML SOLN COMPARISON:  CT 01/07/2015 FINDINGS: Lower chest: Mild heterogeneous pulmonary parenchyma at the lung bases. No acute airspace disease or pleural effusion. Upper normal heart size. Hepatobiliary: Diffusely decreased hepatic density typical of steatosis. Subcapsular cyst in the medial right hepatic lobe. No suspicious liver lesion. Gallbladder physiologically distended, no calcified stone. No biliary dilatation. Pancreas: Grossly negative, partially obscured by adjacent bowel loops. No evidence of pancreatic inflammation. Spleen: Normal in size without focal abnormality. Adrenals/Urinary Tract: No adrenal nodule. No hydronephrosis or perinephric edema. Homogeneous renal enhancement with symmetric excretion on delayed phase imaging. No evidence of focal renal abnormality or stone. Urinary bladder is partially distended without wall thickening. Stomach/Bowel: Mid and upper abdominal small bowel are diffusely dilated and fluid-filled. Complex umbilical hernia with a bilobed appearance,  bilobed hernia defect both above and separate hernia below below the umbilicus. The more superior hernia defect involves short segment of transverse colon, without evidence of colonic obstruction. There is fluid within the cecum and proximal transverse colon, but no abnormal distension, in the distal small bowel is decompressed. The more inferior hernia contains a loop of small bowel, with the exiting small bowel being decompressed, likely cause of obstruction. Small bowel proximal to this is diffusely dilated and fluid-filled. The stomach is distended with ingested contents. There is also a right inguinal hernia, not entirely included in the field of view, however appears to contain nonobstructed or inflamed small bowel. The terminal ileum is difficult to define. Transverse colon is nondistended. Fluid within the sigmoid colon. No evidence of bowel pneumatosis, significant mesenteric edema, or inflammation. No perforation. Appendix not visualized. Vascular/Lymphatic: Normal caliber abdominal aorta. Patent portal vein. No abdominopelvic adenopathy. Reproductive: Prominent prostate gland spans 5.2 cm transverse. Other: Abdominal wall hernias as described.  No free air or ascites. Musculoskeletal: L5-S1 degenerative disc disease. Areas of endplate sclerosis involving the lower thoracic and lumbar spine are felt to be degenerative. There are no acute or suspicious osseous abnormalities. IMPRESSION: 1. Small bowel obstruction related to infraumbilical hernia. No evidence of ischemia or perforation. 2. There is an adjacent bilobed supraumbilical hernia containing transverse colon. No obstruction related to this hernia. 3. Right inguinal hernia, not entirely included in the field of view, however appears to contain nonobstructed or inflamed small bowel. 4. Hepatic steatosis. 5. Enlarged prostate gland. Electronically Signed   By: Narda Rutherford M.D.   On: 10/12/2020 23:10      Assessment/Plan This is a  55 year old  male with a longstanding ventral incisional hernia presenting with nausea and vomiting.  I reviewed his CT scan which shows proximal dilation of the small bowel and stomach, consistent with small bowel obstruction, likely partial given the patient's recent bowel movement.  His hernias are nontender and reducible on exam, this I think his obstruction is more likely adhesive and not secondary to the hernia defects themselves.  He does not have signs of peritonitis on exam and no signs of bowel ischemia on imaging.  I discussed with the patient that I would recommend NG decompression for treatment of his obstruction, but he declines at this time.  Recommend bowel rest and IV fluid hydration for now.  Patient would benefit from an elective hernia repair, however he is still smoking daily, which puts him at increased risk of hernia recurrence and mesh infection, which I discussed with him today.  He does not currently have any indications for emergent repair.  Surgery will follow along.   Sophronia Simas, MD [facility] Surgery General, Hepatobiliary and Pancreatic Surgery 10/13/20 12:56 AM

## 2020-10-13 NOTE — ED Notes (Signed)
Patient given ice chips and lights dimmed per patient request.

## 2020-10-13 NOTE — ED Notes (Signed)
Pt ambulated to restroom and reports that he had a large bowel movement, softer than usual but large. Pt also continues to refuse NG tube.

## 2020-10-13 NOTE — ED Notes (Signed)
Pt ambulatory to restroom prior to going upstairs.  Pt transported upstairs via Psychiatric nurse.

## 2020-10-14 ENCOUNTER — Inpatient Hospital Stay (HOSPITAL_COMMUNITY): Payer: Self-pay

## 2020-10-14 LAB — TYPE AND SCREEN
ABO/RH(D): O POS
Antibody Screen: NEGATIVE
Unit division: 0
Unit division: 0

## 2020-10-14 LAB — CBC
HCT: 24.6 % — ABNORMAL LOW (ref 39.0–52.0)
Hemoglobin: 8.7 g/dL — ABNORMAL LOW (ref 13.0–17.0)
MCH: 38.3 pg — ABNORMAL HIGH (ref 26.0–34.0)
MCHC: 35.4 g/dL (ref 30.0–36.0)
MCV: 108.4 fL — ABNORMAL HIGH (ref 80.0–100.0)
Platelets: 77 10*3/uL — ABNORMAL LOW (ref 150–400)
RBC: 2.27 MIL/uL — ABNORMAL LOW (ref 4.22–5.81)
RDW: 23.2 % — ABNORMAL HIGH (ref 11.5–15.5)
WBC: 3.9 10*3/uL — ABNORMAL LOW (ref 4.0–10.5)
nRBC: 0.8 % — ABNORMAL HIGH (ref 0.0–0.2)

## 2020-10-14 LAB — BASIC METABOLIC PANEL
Anion gap: 7 (ref 5–15)
BUN: 25 mg/dL — ABNORMAL HIGH (ref 6–20)
CO2: 22 mmol/L (ref 22–32)
Calcium: 9.1 mg/dL (ref 8.9–10.3)
Chloride: 107 mmol/L (ref 98–111)
Creatinine, Ser: 1.15 mg/dL (ref 0.61–1.24)
GFR, Estimated: >60 mL/min (ref >60)
Glucose, Bld: 82 mg/dL (ref 70–99)
Potassium: 4.1 mmol/L (ref 3.5–5.1)
Sodium: 136 mmol/L (ref 135–145)

## 2020-10-14 LAB — BPAM RBC
Blood Product Expiration Date: 202210062359
Blood Product Expiration Date: 202210062359
ISSUE DATE / TIME: 202209060124
ISSUE DATE / TIME: 202209061549
Unit Type and Rh: 5100
Unit Type and Rh: 5100

## 2020-10-14 IMAGING — DX DG ABD PORTABLE 1V
2 series · 2 of 2 positions shown · non-contrast
Comparison: CT [DATE]

CLINICAL DATA: Small-bowel obstruction

EXAM:
PORTABLE ABDOMEN - 1 VIEW

[abdomen supine (1 of 2)]
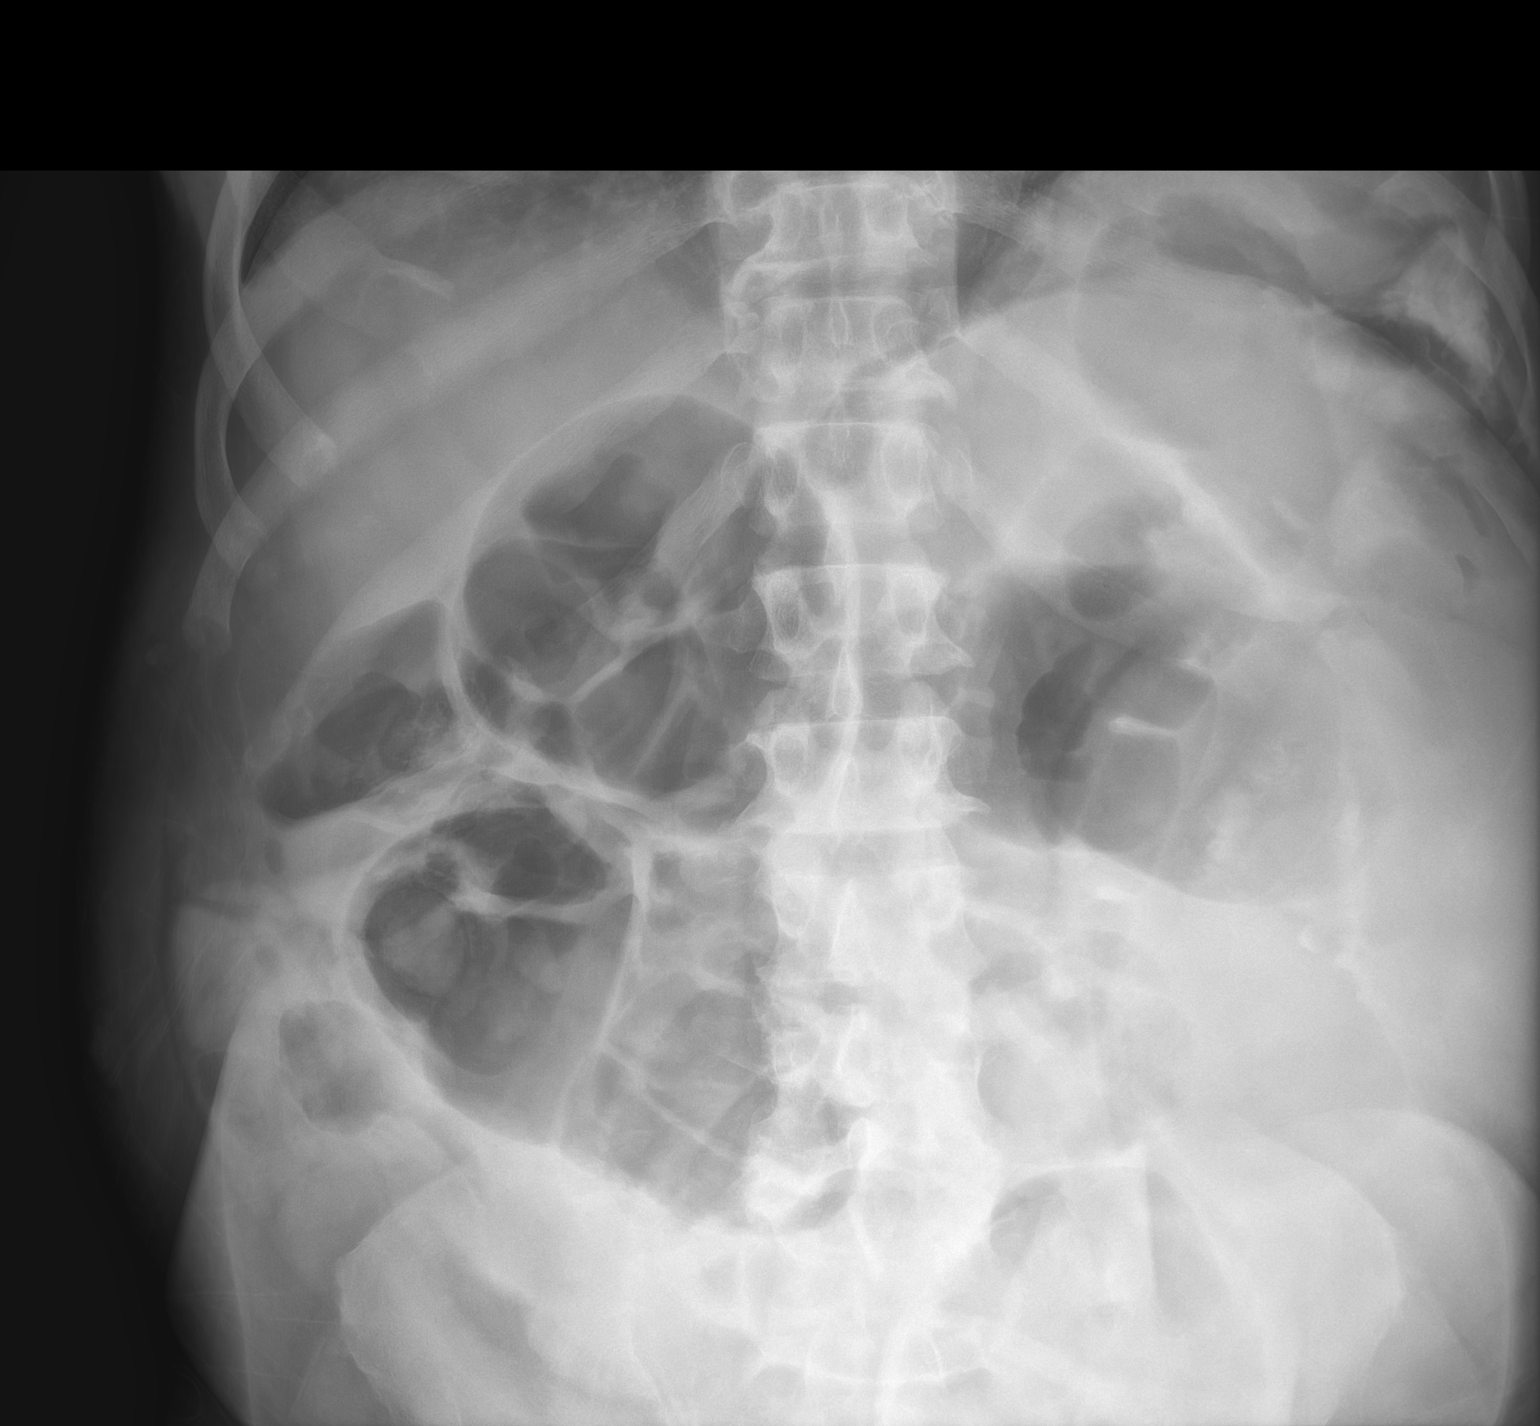

[abdomen supine (2 of 2)]
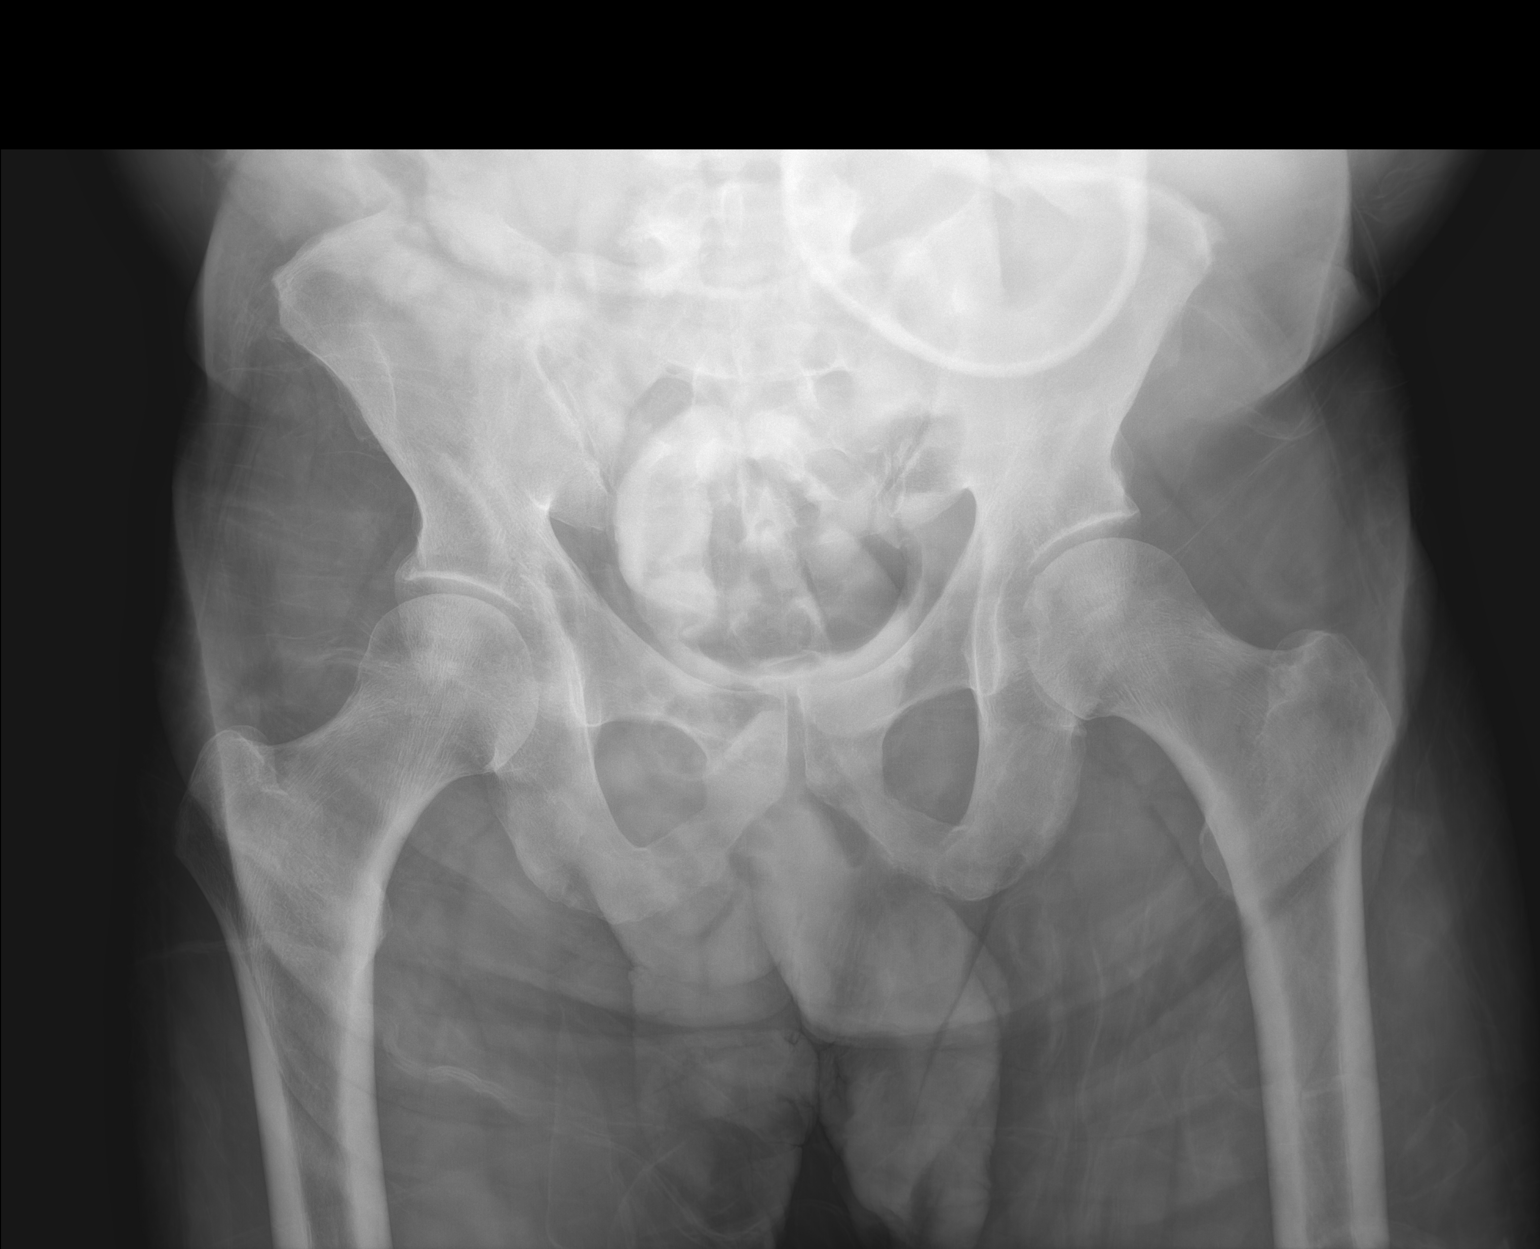

[2 of 2 positions shown; findings below may reference images not displayed]

FINDINGS: Multiple gas-filled dilated loops of small bowel are again seen
throughout the mid abdomen in keeping with a underlying small-bowel
obstruction. Intraluminal contrast is poorly visualized, likely
diluted by extensive intraluminal fluid within the small bowel noted
on prior CT examination. No gross free intraperitoneal gas.
IMPRESSION: Persistent small bowel obstruction.

## 2020-10-14 NOTE — Plan of Care (Signed)

## 2020-10-14 NOTE — Progress Notes (Signed)
PROGRESS NOTE        PATIENT DETAILS Name: Logan Nelson Age: 55 y.o. Sex: male Date of Birth: 12/08/1965 Admit Date: 10/12/2020 Admitting Physician Charlsie Quest, MD VCB:SWHQPRF, No Pcp Per (Inactive)  Brief Narrative: Patient is a 55 y.o. male with hx of HTN, chronic ventral incisional hernia-presented with nausea/vomiting/abdominal pain-he was found to have SBO and a hemoglobin of 6.4 subsequently admitted to the hospitalist service  Significant events: 9/5>> admit for SBO with a hemoglobin of 6.4.  Significant studies: 9/5>> vitamin B12:<50 9/5>> CT abdomen/pelvis: SBO related to infraumbilical hernia 9/6>> CXR: No pneumonia-mild pulmonary congestion.  Antimicrobial therapy: None  Microbiology data: 9/5>> COVID/influenza PCR: Negative  Procedures : None  Consults: General surgery  DVT Prophylaxis : SCDs Start: 10/13/20 0108   Subjective: Past BM earlier this morning.  No abdominal pain.   Assessment/Plan: SBO: Improving-(refused NG tube)-had BM this morning-abdominal exam remains benign-trial of clear liquids being started by general surgery.   Macrocytic anemia due to severe vitamin B12 deficiency: Probably nutritional-given prior history of bowel resection.  Hemoglobin stable-s/p 2 units of PRBC-started on vitamin B12 supplementation.    Leukopenia/thrombocytopenia: Likely due to vitamin B12 deficiency-follow CBC.  HTN: BP stable-antihypertensives on hold.  Resume when able.  Obesity: Estimated body mass index is 32.74 kg/m as calculated from the following:   Height as of this encounter: 5\' 6"  (1.676 m).   Weight as of this encounter: 92 kg.    Diet: Diet Order             Diet clear liquid Room service appropriate? Yes; Fluid consistency: Thin  Diet effective now                    Code Status: Full code   Family Communication: None at bedside  Disposition Plan: Status is: Inpatient  Remains  inpatient appropriate because:Inpatient level of care appropriate due to severity of illness  Dispo: The patient is from: Home              Anticipated d/c is to: Home              Patient currently is not medically stable to d/c.   Difficult to place patient No    Barriers to Discharge: SBO-severe symptomatic anemia-on IVF/NPO.  Not yet stable for discharge as bowel function has not come back yet.  Antimicrobial agents: Anti-infectives (From admission, onward)    None        Time spent: 25 minutes-Greater than 50% of this time was spent in counseling, explanation of diagnosis, planning of further management, and coordination of care.  MEDICATIONS: Scheduled Meds:  cyanocobalamin  1,000 mcg Intramuscular Daily   furosemide  20 mg Intravenous Once   Continuous Infusions:   PRN Meds:.acetaminophen, albuterol, morphine injection, ondansetron **OR** ondansetron (ZOFRAN) IV   PHYSICAL EXAM: Vital signs: Vitals:   10/13/20 2016 10/14/20 0431 10/14/20 0824 10/14/20 1209  BP: 115/75 109/63 112/72 110/70  Pulse: (!) 55  (!) 53 60  Resp: 19  17 17   Temp: 98.4 F (36.9 C) 98 F (36.7 C) 98.3 F (36.8 C) 98.2 F (36.8 C)  TempSrc: Oral Oral Oral Oral  SpO2: 100% 99% 98% 100%  Weight:      Height:       Filed Weights   10/12/20 1937  Weight: 92 kg  Body mass index is 32.74 kg/m.   Gen Exam:Alert awake-not in any distress HEENT:atraumatic, normocephalic Chest: B/L clear to auscultation anteriorly CVS:S1S2 regular Abdomen:soft non tender, non distended-ventral hernia in place. Extremities:no edema Neurology: Non focal Skin: no rash   I have personally reviewed following labs and imaging studies  LABORATORY DATA: CBC: Recent Labs  Lab 10/12/20 1944 10/13/20 0552 10/13/20 2124 10/14/20 0118  WBC 3.8* 3.9*  --  3.9*  NEUTROABS 1.6*  --   --   --   HGB 6.4* 6.6* 8.2* 8.7*  HCT 18.9* 19.1* 23.2* 24.6*  MCV 121.9* 115.8*  --  108.4*  PLT 100* 81*  --  77*      Basic Metabolic Panel: Recent Labs  Lab 10/12/20 1944 10/13/20 0552 10/14/20 0118  NA 133* 133* 136  K 3.8 3.8 4.1  CL 105 105 107  CO2 21* 22 22  GLUCOSE 96 84 82  BUN 25* 26* 25*  CREATININE 1.26* 1.09 1.15  CALCIUM 8.9 8.6* 9.1     GFR: Estimated Creatinine Clearance: 75.3 mL/min (by C-G formula based on SCr of 1.15 mg/dL).  Liver Function Tests: Recent Labs  Lab 10/12/20 1944 10/13/20 0552  AST 116* 97*  ALT 48* 43  ALKPHOS 90 88  BILITOT 0.8 0.6  PROT 6.8 6.2*  ALBUMIN 3.7 3.3*    Recent Labs  Lab 10/12/20 1944  LIPASE 23    No results for input(s): AMMONIA in the last 168 hours.  Coagulation Profile: No results for input(s): INR, PROTIME in the last 168 hours.  Cardiac Enzymes: No results for input(s): CKTOTAL, CKMB, CKMBINDEX, TROPONINI in the last 168 hours.  BNP (last 3 results) No results for input(s): PROBNP in the last 8760 hours.  Lipid Profile: No results for input(s): CHOL, HDL, LDLCALC, TRIG, CHOLHDL, LDLDIRECT in the last 72 hours.  Thyroid Function Tests: No results for input(s): TSH, T4TOTAL, FREET4, T3FREE, THYROIDAB in the last 72 hours.  Anemia Panel: Recent Labs    10/12/20 2204  VITAMINB12 <50*  FOLATE 18.5  FERRITIN 1,318*  TIBC 182*  IRON 148  RETICCTPCT 0.7     Urine analysis:    Component Value Date/Time   COLORURINE AMBER (A) 01/07/2015 1617   APPEARANCEUR CLEAR 01/07/2015 1617   LABSPEC 1.034 (H) 01/07/2015 1617   PHURINE 5.5 01/07/2015 1617   GLUCOSEU NEGATIVE 01/07/2015 1617   HGBUR NEGATIVE 01/07/2015 1617   BILIRUBINUR SMALL (A) 01/07/2015 1617   KETONESUR 15 (A) 01/07/2015 1617   PROTEINUR NEGATIVE 01/07/2015 1617   NITRITE NEGATIVE 01/07/2015 1617   LEUKOCYTESUR TRACE (A) 01/07/2015 1617    Sepsis Labs: Lactic Acid, Venous No results found for: LATICACIDVEN  MICROBIOLOGY: Recent Results (from the past 240 hour(s))  Resp Panel by RT-PCR (Flu A&B, Covid) Nasopharyngeal Swab     Status:  None   Collection Time: 10/12/20 11:59 PM   Specimen: Nasopharyngeal Swab; Nasopharyngeal(NP) swabs in vial transport medium  Result Value Ref Range Status   SARS Coronavirus 2 by RT PCR NEGATIVE NEGATIVE Final    Comment: (NOTE) SARS-CoV-2 target nucleic acids are NOT DETECTED.  The SARS-CoV-2 RNA is generally detectable in upper respiratory specimens during the acute phase of infection. The lowest concentration of SARS-CoV-2 viral copies this assay can detect is 138 copies/mL. A negative result does not preclude SARS-Cov-2 infection and should not be used as the sole basis for treatment or other patient management decisions. A negative result may occur with  improper specimen collection/handling, submission of specimen other  than nasopharyngeal swab, presence of viral mutation(s) within the areas targeted by this assay, and inadequate number of viral copies(<138 copies/mL). A negative result must be combined with clinical observations, patient history, and epidemiological information. The expected result is Negative.  Fact Sheet for Patients:  BloggerCourse.com  Fact Sheet for Healthcare Providers:  SeriousBroker.it  This test is no t yet approved or cleared by the [place] FDA and  has been authorized for detection and/or diagnosis of SARS-CoV-2 by FDA under an Emergency Use Authorization (EUA). This EUA will remain  in effect (meaning this test can be used) for the duration of the COVID-19 declaration under Section 564(b)(1) of the Act, 21 U.S.C.section 360bbb-3(b)(1), unless the authorization is terminated  or revoked sooner.       Influenza A by PCR NEGATIVE NEGATIVE Final   Influenza B by PCR NEGATIVE NEGATIVE Final    Comment: (NOTE) The Xpert Xpress SARS-CoV-2/FLU/RSV plus assay is intended as an aid in the diagnosis of influenza from Nasopharyngeal swab specimens and should not be used as a sole basis for  treatment. Nasal washings and aspirates are unacceptable for Xpert Xpress SARS-CoV-2/FLU/RSV testing.  Fact Sheet for Patients: BloggerCourse.com  Fact Sheet for Healthcare Providers: SeriousBroker.it  This test is not yet approved or cleared by the [place] FDA and has been authorized for detection and/or diagnosis of SARS-CoV-2 by FDA under an Emergency Use Authorization (EUA). This EUA will remain in effect (meaning this test can be used) for the duration of the COVID-19 declaration under Section 564(b)(1) of the Act, 21 U.S.C. section 360bbb-3(b)(1), unless the authorization is terminated or revoked.  Performed at [facility], [address], [place], [place] [address]     RADIOLOGY STUDIES/RESULTS: CT ABDOMEN PELVIS W CONTRAST  Result Date: 10/12/2020 CLINICAL DATA:  Acute abdominal pain.  Hernia.  Query obstruction. EXAM: CT ABDOMEN AND PELVIS WITH CONTRAST TECHNIQUE: Multidetector CT imaging of the abdomen and pelvis was performed using the standard protocol following bolus administration of intravenous contrast. CONTRAST:  69mL OMNIPAQUE IOHEXOL 350 MG/ML SOLN COMPARISON:  CT 01/07/2015 FINDINGS: Lower chest: Mild heterogeneous pulmonary parenchyma at the lung bases. No acute airspace disease or pleural effusion. Upper normal heart size. Hepatobiliary: Diffusely decreased hepatic density typical of steatosis. Subcapsular cyst in the medial right hepatic lobe. No suspicious liver lesion. Gallbladder physiologically distended, no calcified stone. No biliary dilatation. Pancreas: Grossly negative, partially obscured by adjacent bowel loops. No evidence of pancreatic inflammation. Spleen: Normal in size without focal abnormality. Adrenals/Urinary Tract: No adrenal nodule. No hydronephrosis or perinephric edema. Homogeneous renal enhancement with symmetric excretion on delayed phase imaging. No evidence of focal renal  abnormality or stone. Urinary bladder is partially distended without wall thickening. Stomach/Bowel: Mid and upper abdominal small bowel are diffusely dilated and fluid-filled. Complex umbilical hernia with a bilobed appearance, bilobed hernia defect both above and separate hernia below below the umbilicus. The more superior hernia defect involves short segment of transverse colon, without evidence of colonic obstruction. There is fluid within the cecum and proximal transverse colon, but no abnormal distension, in the distal small bowel is decompressed. The more inferior hernia contains a loop of small bowel, with the exiting small bowel being decompressed, likely cause of obstruction. Small bowel proximal to this is diffusely dilated and fluid-filled. The stomach is distended with ingested contents. There is also a right inguinal hernia, not entirely included in the field of view, however appears to contain nonobstructed or inflamed small bowel. The terminal ileum  is difficult to define. Transverse colon is nondistended. Fluid within the sigmoid colon. No evidence of bowel pneumatosis, significant mesenteric edema, or inflammation. No perforation. Appendix not visualized. Vascular/Lymphatic: Normal caliber abdominal aorta. Patent portal vein. No abdominopelvic adenopathy. Reproductive: Prominent prostate gland spans 5.2 cm transverse. Other: Abdominal wall hernias as described.  No free air or ascites. Musculoskeletal: L5-S1 degenerative disc disease. Areas of endplate sclerosis involving the lower thoracic and lumbar spine are felt to be degenerative. There are no acute or suspicious osseous abnormalities. IMPRESSION: 1. Small bowel obstruction related to infraumbilical hernia. No evidence of ischemia or perforation. 2. There is an adjacent bilobed supraumbilical hernia containing transverse colon. No obstruction related to this hernia. 3. Right inguinal hernia, not entirely included in the field of view, however  appears to contain nonobstructed or inflamed small bowel. 4. Hepatic steatosis. 5. Enlarged prostate gland. Electronically Signed   By: Narda Rutherford M.D.   On: 10/12/2020 23:10   DG CHEST PORT 1 VIEW  Result Date: 10/13/2020 CLINICAL DATA:  Hemoptysis EXAM: PORTABLE CHEST 1 VIEW COMPARISON:  07/10/2019 FINDINGS: Unchanged mild cardiomegaly. Interval improvement of pulmonary vascular congestion. Lungs are clear. Advanced degenerative changes of the right glenohumeral joint. IMPRESSION: Unchanged mild cardiomegaly. Interval improvement of pulmonary vascular congestion. Electronically Signed   By: Acquanetta Belling M.D.   On: 10/13/2020 07:54   DG Abd Portable 1V-Small Bowel Obstruction Protocol-initial, 8 hr delay  Result Date: 10/14/2020 CLINICAL DATA:  Small-bowel obstruction EXAM: PORTABLE ABDOMEN - 1 VIEW COMPARISON:  CT 10/12/2020 FINDINGS: Multiple gas-filled dilated loops of small bowel are again seen throughout the mid abdomen in keeping with a underlying small-bowel obstruction. Intraluminal contrast is poorly visualized, likely diluted by extensive intraluminal fluid within the small bowel noted on prior CT examination. No gross free intraperitoneal gas. IMPRESSION: Persistent small bowel obstruction. Electronically Signed   By: Helyn Numbers M.D.   On: 10/14/2020 03:46     LOS: 1 day   Jeoffrey Massed, MD  [facility]    To contact the attending provider between 7A-7P or the covering provider during after hours 7P-7A, please log into the web site [email or web address] and access using universal [place] password for that web site. If you do not have the password, please call the hospital operator.  10/14/2020, 2:18 PM

## 2020-10-14 NOTE — Progress Notes (Signed)
Subjective: CC: Patient reports that he has no current abdominal pain. Denies n/v. Feels less bloated. Passing flatus. Had a loose bm this am.   Objective: Vital signs in last 24 hours: Temp:  [97.6 F (36.4 C)-98.6 F (37 C)] 98.3 F (36.8 C) (09/07 0824) Pulse Rate:  [47-61] 53 (09/07 0824) Resp:  [11-19] 17 (09/07 0824) BP: (100-115)/(63-75) 112/72 (09/07 0824) SpO2:  [98 %-100 %] 98 % (09/07 0824) Last BM Date: 10/13/20 (Has 2 in ED per pt)  Intake/Output from previous day: 09/06 0701 - 09/07 0700 In: 855 [P.O.:75; I.V.:80; Blood:700] Out: 400 [Urine:400] Intake/Output this shift: No intake/output data recorded.  PE: Gen:  Alert, NAD, pleasant Lungs: Normal rate and effort  Abd: Soft, ND, nontender to palpation. Midline surgical scar with multiple hernias near the umbilicus that are soft and reducible with no overlying skin changes or tenderness to palpation. He also has a large right inguinal hernia, soft and reducible in the supine position Psych: A&Ox3  Skin: no rashes noted, warm and dry  Lab Results:  Recent Labs    10/13/20 0552 10/13/20 2124 10/14/20 0118  WBC 3.9*  --  3.9*  HGB 6.6* 8.2* 8.7*  HCT 19.1* 23.2* 24.6*  PLT 81*  --  77*   BMET Recent Labs    10/13/20 0552 10/14/20 0118  NA 133* 136  K 3.8 4.1  CL 105 107  CO2 22 22  GLUCOSE 84 82  BUN 26* 25*  CREATININE 1.09 1.15  CALCIUM 8.6* 9.1   PT/INR No results for input(s): LABPROT, INR in the last 72 hours. CMP     Component Value Date/Time   NA 136 10/14/2020 0118   K 4.1 10/14/2020 0118   CL 107 10/14/2020 0118   CO2 22 10/14/2020 0118   GLUCOSE 82 10/14/2020 0118   BUN 25 (H) 10/14/2020 0118   CREATININE 1.15 10/14/2020 0118   CALCIUM 9.1 10/14/2020 0118   PROT 6.2 (L) 10/13/2020 0552   ALBUMIN 3.3 (L) 10/13/2020 0552   AST 97 (H) 10/13/2020 0552   ALT 43 10/13/2020 0552   ALKPHOS 88 10/13/2020 0552   BILITOT 0.6 10/13/2020 0552   GFRNONAA >60 10/14/2020 0118    GFRAA >60 01/23/2019 1523   Lipase     Component Value Date/Time   LIPASE 23 10/12/2020 1944    Studies/Results: CT ABDOMEN PELVIS W CONTRAST  Result Date: 10/12/2020 CLINICAL DATA:  Acute abdominal pain.  Hernia.  Query obstruction. EXAM: CT ABDOMEN AND PELVIS WITH CONTRAST TECHNIQUE: Multidetector CT imaging of the abdomen and pelvis was performed using the standard protocol following bolus administration of intravenous contrast. CONTRAST:  69mL OMNIPAQUE IOHEXOL 350 MG/ML SOLN COMPARISON:  CT 01/07/2015 FINDINGS: Lower chest: Mild heterogeneous pulmonary parenchyma at the lung bases. No acute airspace disease or pleural effusion. Upper normal heart size. Hepatobiliary: Diffusely decreased hepatic density typical of steatosis. Subcapsular cyst in the medial right hepatic lobe. No suspicious liver lesion. Gallbladder physiologically distended, no calcified stone. No biliary dilatation. Pancreas: Grossly negative, partially obscured by adjacent bowel loops. No evidence of pancreatic inflammation. Spleen: Normal in size without focal abnormality. Adrenals/Urinary Tract: No adrenal nodule. No hydronephrosis or perinephric edema. Homogeneous renal enhancement with symmetric excretion on delayed phase imaging. No evidence of focal renal abnormality or stone. Urinary bladder is partially distended without wall thickening. Stomach/Bowel: Mid and upper abdominal small bowel are diffusely dilated and fluid-filled. Complex umbilical hernia with a bilobed appearance, bilobed hernia defect both above  and separate hernia below below the umbilicus. The more superior hernia defect involves short segment of transverse colon, without evidence of colonic obstruction. There is fluid within the cecum and proximal transverse colon, but no abnormal distension, in the distal small bowel is decompressed. The more inferior hernia contains a loop of small bowel, with the exiting small bowel being decompressed, likely cause of  obstruction. Small bowel proximal to this is diffusely dilated and fluid-filled. The stomach is distended with ingested contents. There is also a right inguinal hernia, not entirely included in the field of view, however appears to contain nonobstructed or inflamed small bowel. The terminal ileum is difficult to define. Transverse colon is nondistended. Fluid within the sigmoid colon. No evidence of bowel pneumatosis, significant mesenteric edema, or inflammation. No perforation. Appendix not visualized. Vascular/Lymphatic: Normal caliber abdominal aorta. Patent portal vein. No abdominopelvic adenopathy. Reproductive: Prominent prostate gland spans 5.2 cm transverse. Other: Abdominal wall hernias as described.  No free air or ascites. Musculoskeletal: L5-S1 degenerative disc disease. Areas of endplate sclerosis involving the lower thoracic and lumbar spine are felt to be degenerative. There are no acute or suspicious osseous abnormalities. IMPRESSION: 1. Small bowel obstruction related to infraumbilical hernia. No evidence of ischemia or perforation. 2. There is an adjacent bilobed supraumbilical hernia containing transverse colon. No obstruction related to this hernia. 3. Right inguinal hernia, not entirely included in the field of view, however appears to contain nonobstructed or inflamed small bowel. 4. Hepatic steatosis. 5. Enlarged prostate gland. Electronically Signed   By: Narda Rutherford M.D.   On: 10/12/2020 23:10   DG CHEST PORT 1 VIEW  Result Date: 10/13/2020 CLINICAL DATA:  Hemoptysis EXAM: PORTABLE CHEST 1 VIEW COMPARISON:  07/10/2019 FINDINGS: Unchanged mild cardiomegaly. Interval improvement of pulmonary vascular congestion. Lungs are clear. Advanced degenerative changes of the right glenohumeral joint. IMPRESSION: Unchanged mild cardiomegaly. Interval improvement of pulmonary vascular congestion. Electronically Signed   By: Acquanetta Belling M.D.   On: 10/13/2020 07:54   DG Abd Portable 1V-Small  Bowel Obstruction Protocol-initial, 8 hr delay  Result Date: 10/14/2020 CLINICAL DATA:  Small-bowel obstruction EXAM: PORTABLE ABDOMEN - 1 VIEW COMPARISON:  CT 10/12/2020 FINDINGS: Multiple gas-filled dilated loops of small bowel are again seen throughout the mid abdomen in keeping with a underlying small-bowel obstruction. Intraluminal contrast is poorly visualized, likely diluted by extensive intraluminal fluid within the small bowel noted on prior CT examination. No gross free intraperitoneal gas. IMPRESSION: Persistent small bowel obstruction. Electronically Signed   By: Helyn Numbers M.D.   On: 10/14/2020 03:46    Anti-infectives: Anti-infectives (From admission, onward)    None        Assessment/Plan SBO Ventral Incisional Hernia (Prior hx of colon resection and later colostomy takedown) Right Inguinal Hernia - Patients hernias are soft, completely reducible and without skins changes on exam. Patient symptomatically has improved this am and has no abdominal pain or n/v. He is passing flatus and had a BM this AM. He did have some distension of the SB noted on xray around 2am but given his symptomatic improvement, he is passing flatus, having BM's and his reassuring exam - will initiate trial of clears.   FEN - CLD VTE - SCDs, per TR[facility] ID - None  Vit B12 deficiency, ABL anemia - FOBT negative, received 2U PRBC HTN Tobacco use    LOS: 1 day    Jacinto Halim , [facility] 10/14/2020, 9:00 AM Please see Amion for pager number during day  hours 7:00am-4:30pm

## 2020-10-14 NOTE — TOC Initial Note (Addendum)
Transition of Care [facility]) - Initial/Assessment Note    Patient Details  Name: Logan Nelson MRN: [id number] Date of Birth: 11-19-1965  [facility]) CM/SW Contact:    Kingsley Plan, RN Phone Number: 10/14/2020, 11:24 AM  Clinical Narrative:                 Spoke to patient at bedside . Confirmed face sheet information. Patient lives with "grand dad".   Patient does not have PCP . Discussed [facility] patient in agreement with the first available appointment. NCM scheduled appointment at [facility] at [facility], November 04, 2020 at 1:30 pm.    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 At discahrge NCM will see if discharge prescriptions are covered by [facility]. Pharmacy changed to [facility] pharmacy.    Patient does not use any DME. Patient has transportation.  Expected Discharge Plan: Home/Self Care     Patient Goals and CMS Choice Patient states their goals for this hospitalization and ongoing recovery are:: to return to home CMS Medicare.gov Compare Post Acute Care list provided to:: Patient Choice offered to / list presented to : Patient  Expected Discharge Plan and Services Expected Discharge Plan: Home/Self Care In-house Referral: Artist, PCP / [facility] Discharge Planning Services: CM Consult   Living arrangements for the past 2 months: Single Family Home                   DME Agency: NA       HH Arranged: NA HH Agency: NA        Prior Living Arrangements/Services Living arrangements for the past 2 months: Single Family Home Lives with:: Relatives (grand dad) Patient language and need for interpreter reviewed:: Yes Do you feel safe going back to the place where you live?: Yes      Need for Family Participation in Patient Care: Yes (Comment) Care giver support system in place?: Yes (comment)   Criminal Activity/Legal Involvement Pertinent to Current Situation/Hospitalization: No - Comment as  needed  Activities of Daily Living      Permission Sought/Granted   Permission granted to share information with : No              Emotional Assessment Appearance:: Appears stated age Attitude/Demeanor/Rapport: Engaged Affect (typically observed): Accepting Orientation: : Oriented to Place, Oriented to Self, Oriented to  Time, Oriented to Situation Alcohol / Substance Use: Not Applicable Psych Involvement: No (comment)  Admission diagnosis:  Hernia of abdominal cavity [K46.9] Small bowel obstruction (HCC) [K56.609] SBO (small bowel obstruction) (HCC) [K56.609] Encounter for imaging study to confirm nasogastric (NG) tube placement [Z01.89] Hemoptysis [R04.2] Anemia due to vitamin B12 deficiency, unspecified B12 deficiency type [D51.9] Patient Active Problem List   Diagnosis Date Noted   Small bowel obstruction (HCC) 10/13/2020   Anemia due to vitamin B12 deficiency 10/13/2020   Hypertension 01/09/2015   Incisional hernia 01/07/2015   PCP:  Patient, No Pcp Per (Inactive) Pharmacy:   [facility] [address] [place] [address] Phone: [phone] Fax: [phone]     Social Determinants of Health (SDOH) Interventions    Readmission Risk Interventions No flowsheet data found.

## 2020-10-15 ENCOUNTER — Other Ambulatory Visit (HOSPITAL_COMMUNITY): Payer: Self-pay

## 2020-10-15 MED ORDER — VITAMIN B-12 1000 MCG PO TABS
1000.0000 ug | ORAL_TABLET | Freq: Every day | ORAL | 0 refills | Status: DC
  Filled 2020-10-15: qty 30, 30d supply, fill #0

## 2020-10-15 MED ORDER — AMLODIPINE BESYLATE 2.5 MG PO TABS
2.5000 mg | ORAL_TABLET | Freq: Every day | ORAL | 0 refills | Status: DC
  Filled 2020-10-15: qty 30, 30d supply, fill #0

## 2020-10-15 MED ORDER — CYANOCOBALAMIN 1000 MCG/ML IJ SOLN
1000.0000 ug | INTRAMUSCULAR | 0 refills | Status: DC
  Filled 2020-10-15: qty 4, 28d supply, fill #0

## 2020-10-15 NOTE — Progress Notes (Signed)
PROGRESS NOTE        PATIENT DETAILS Name: Logan Nelson Age: 55 y.o. Sex: male Date of Birth: [birth year] Admit Date: 10/12/2020 Admitting Physician Charlsie Quest, MD GLO:VFIEPPI, No Pcp Per (Inactive)  Brief Narrative: Patient is a 55 y.o. male with hx of HTN, chronic ventral incisional hernia-presented with nausea/vomiting/abdominal pain-he was found to have SBO and a hemoglobin of 6.4 subsequently admitted to the hospitalist service  Significant events: 9/5>> admit for SBO with a hemoglobin of 6.4.  Significant studies: 9/5>> vitamin B12:<50 9/5>> CT abdomen/pelvis: SBO related to infraumbilical hernia 9/6>> CXR: No pneumonia-mild pulmonary congestion.  Antimicrobial therapy: None  Microbiology data: 9/5>> COVID/influenza PCR: Negative  Procedures : None  Consults: General surgery  DVT Prophylaxis : SCDs Start: 10/13/20 0108   Subjective: Tolerating liquids-had a small BM earlier.  Feels better.   Assessment/Plan: SBO: Improving with supportive care-tolerating clear liquids-plan is to advance diet to soft diet yesterday-and if he does well-should be able to discharge home.    Macrocytic anemia due to severe vitamin B12 deficiency: Probably nutritional-given prior history of bowel resection.  Hemoglobin stable-s/p 2 units of PRBC-started on vitamin B12 supplementation.    Leukopenia/thrombocytopenia: Likely due to vitamin B12 deficiency-follow CBC.  HTN: BP stable-antihypertensives on hold.  Resume when able.  Obesity: Estimated body mass index is 32.74 kg/m as calculated from the following:   Height as of this encounter: 5\' 6"  (1.676 m).   Weight as of this encounter: 92 kg.    Diet: Diet Order             DIET SOFT Room service appropriate? Yes; Fluid consistency: Thin  Diet effective now                    Code Status: Full code   Family Communication: None at bedside  Disposition Plan: Status is:  Inpatient  Remains inpatient appropriate because:Inpatient level of care appropriate due to severity of illness  Dispo: The patient is from: Home              Anticipated d/c is to: Home              Patient currently is not medically stable to d/c.   Difficult to place patient No    Barriers to Discharge: SBO-severe symptomatic anemia-on IVF/NPO.  Not yet stable for discharge as bowel function has not come back yet.  Antimicrobial agents: Anti-infectives (From admission, onward)    None        Time spent: 25 minutes-Greater than 50% of this time was spent in counseling, explanation of diagnosis, planning of further management, and coordination of care.  MEDICATIONS: Scheduled Meds:  cyanocobalamin  1,000 mcg Intramuscular Daily   furosemide  20 mg Intravenous Once   Continuous Infusions:   PRN Meds:.acetaminophen, albuterol, morphine injection, ondansetron **OR** ondansetron (ZOFRAN) IV   PHYSICAL EXAM: Vital signs: Vitals:   10/14/20 1936 10/15/20 0535 10/15/20 0839 10/15/20 0902  BP:  112/78 111/80 109/79  Pulse:  (!) 56 (!) 52 (!) 56  Resp: 16 17 14 14   Temp:  98 F (36.7 C) 97.7 F (36.5 C) 97.9 F (36.6 C)  TempSrc: Oral  Oral Oral  SpO2:  98% 99% 100%  Weight:      Height:       Filed Weights   10/12/20 1937  Weight: 92 kg   Body mass index is 32.74 kg/m.   Gen Exam:Alert awake-not in any distress HEENT:atraumatic, normocephalic Chest: B/L clear to auscultation anteriorly CVS:S1S2 regular Abdomen:soft non tender, non distended Extremities:no edema Neurology: Non focal Skin: no rash   I have personally reviewed following labs and imaging studies  LABORATORY DATA: CBC: Recent Labs  Lab 10/12/20 1944 10/13/20 0552 10/13/20 2124 10/14/20 0118  WBC 3.8* 3.9*  --  3.9*  NEUTROABS 1.6*  --   --   --   HGB 6.4* 6.6* 8.2* 8.7*  HCT 18.9* 19.1* 23.2* 24.6*  MCV 121.9* 115.8*  --  108.4*  PLT 100* 81*  --  77*     Basic Metabolic  Panel: Recent Labs  Lab 10/12/20 1944 10/13/20 0552 10/14/20 0118  NA 133* 133* 136  K 3.8 3.8 4.1  CL 105 105 107  CO2 21* 22 22  GLUCOSE 96 84 82  BUN 25* 26* 25*  CREATININE 1.26* 1.09 1.15  CALCIUM 8.9 8.6* 9.1     GFR: Estimated Creatinine Clearance: 75.3 mL/min (by C-G formula based on SCr of 1.15 mg/dL).  Liver Function Tests: Recent Labs  Lab 10/12/20 1944 10/13/20 0552  AST 116* 97*  ALT 48* 43  ALKPHOS 90 88  BILITOT 0.8 0.6  PROT 6.8 6.2*  ALBUMIN 3.7 3.3*    Recent Labs  Lab 10/12/20 1944  LIPASE 23    No results for input(s): AMMONIA in the last 168 hours.  Coagulation Profile: No results for input(s): INR, PROTIME in the last 168 hours.  Cardiac Enzymes: No results for input(s): CKTOTAL, CKMB, CKMBINDEX, TROPONINI in the last 168 hours.  BNP (last 3 results) No results for input(s): PROBNP in the last 8760 hours.  Lipid Profile: No results for input(s): CHOL, HDL, LDLCALC, TRIG, CHOLHDL, LDLDIRECT in the last 72 hours.  Thyroid Function Tests: No results for input(s): TSH, T4TOTAL, FREET4, T3FREE, THYROIDAB in the last 72 hours.  Anemia Panel: Recent Labs    10/12/20 2204  VITAMINB12 <50*  FOLATE 18.5  FERRITIN 1,318*  TIBC 182*  IRON 148  RETICCTPCT 0.7     Urine analysis:    Component Value Date/Time   COLORURINE AMBER (A) 01/07/2015 1617   APPEARANCEUR CLEAR 01/07/2015 1617   LABSPEC 1.034 (H) 01/07/2015 1617   PHURINE 5.5 01/07/2015 1617   GLUCOSEU NEGATIVE 01/07/2015 1617   HGBUR NEGATIVE 01/07/2015 1617   BILIRUBINUR SMALL (A) 01/07/2015 1617   KETONESUR 15 (A) 01/07/2015 1617   PROTEINUR NEGATIVE 01/07/2015 1617   NITRITE NEGATIVE 01/07/2015 1617   LEUKOCYTESUR TRACE (A) 01/07/2015 1617    Sepsis Labs: Lactic Acid, Venous No results found for: LATICACIDVEN  MICROBIOLOGY: Recent Results (from the past 240 hour(s))  Resp Panel by RT-PCR (Flu A&B, Covid) Nasopharyngeal Swab     Status: None   Collection  Time: 10/12/20 11:59 PM   Specimen: Nasopharyngeal Swab; Nasopharyngeal(NP) swabs in vial transport medium  Result Value Ref Range Status   SARS Coronavirus 2 by RT PCR NEGATIVE NEGATIVE Final    Comment: (NOTE) SARS-CoV-2 target nucleic acids are NOT DETECTED.  The SARS-CoV-2 RNA is generally detectable in upper respiratory specimens during the acute phase of infection. The lowest concentration of SARS-CoV-2 viral copies this assay can detect is 138 copies/mL. A negative result does not preclude SARS-Cov-2 infection and should not be used as the sole basis for treatment or other patient management decisions. A negative result may occur with  improper specimen collection/handling, submission of  specimen other than nasopharyngeal swab, presence of viral mutation(s) within the areas targeted by this assay, and inadequate number of viral copies(<138 copies/mL). A negative result must be combined with clinical observations, patient history, and epidemiological information. The expected result is Negative.  Fact Sheet for Patients:  BloggerCourse.com  Fact Sheet for Healthcare Providers:  SeriousBroker.it  This test is no t yet approved or cleared by the [place] FDA and  has been authorized for detection and/or diagnosis of SARS-CoV-2 by FDA under an Emergency Use Authorization (EUA). This EUA will remain  in effect (meaning this test can be used) for the duration of the COVID-19 declaration under Section 564(b)(1) of the Act, 21 U.S.C.section 360bbb-3(b)(1), unless the authorization is terminated  or revoked sooner.       Influenza A by PCR NEGATIVE NEGATIVE Final   Influenza B by PCR NEGATIVE NEGATIVE Final    Comment: (NOTE) The Xpert Xpress SARS-CoV-2/FLU/RSV plus assay is intended as an aid in the diagnosis of influenza from Nasopharyngeal swab specimens and should not be used as a sole basis for treatment. Nasal washings  and aspirates are unacceptable for Xpert Xpress SARS-CoV-2/FLU/RSV testing.  Fact Sheet for Patients: BloggerCourse.com  Fact Sheet for Healthcare Providers: SeriousBroker.it  This test is not yet approved or cleared by the [place] FDA and has been authorized for detection and/or diagnosis of SARS-CoV-2 by FDA under an Emergency Use Authorization (EUA). This EUA will remain in effect (meaning this test can be used) for the duration of the COVID-19 declaration under Section 564(b)(1) of the Act, 21 U.S.C. section 360bbb-3(b)(1), unless the authorization is terminated or revoked.  Performed at [facility], [address], [place], [place] [address]     RADIOLOGY STUDIES/RESULTS: DG Abd Portable 1V-Small Bowel Obstruction Protocol-initial, 8 hr delay  Result Date: 10/14/2020 CLINICAL DATA:  Small-bowel obstruction EXAM: PORTABLE ABDOMEN - 1 VIEW COMPARISON:  CT 10/12/2020 FINDINGS: Multiple gas-filled dilated loops of small bowel are again seen throughout the mid abdomen in keeping with a underlying small-bowel obstruction. Intraluminal contrast is poorly visualized, likely diluted by extensive intraluminal fluid within the small bowel noted on prior CT examination. No gross free intraperitoneal gas. IMPRESSION: Persistent small bowel obstruction. Electronically Signed   By: Helyn Numbers M.D.   On: 10/14/2020 03:46     LOS: 2 days   Jeoffrey Massed, MD  [facility]    To contact the attending provider between 7A-7P or the covering provider during after hours 7P-7A, please log into the web site [email or web address] and access using universal [place] password for that web site. If you do not have the password, please call the hospital operator.  10/15/2020, 2:20 PM

## 2020-10-15 NOTE — Progress Notes (Signed)
Subjective: CC: Doing well. No abdominal pain or nausea this am. Tolerating fld. No emesis. Passing flatus. Loose BM this am.   Objective: Vital signs in last 24 hours: Temp:  [98 F (36.7 C)-98.3 F (36.8 C)] 98 F (36.7 C) (09/08 0535) Pulse Rate:  [52-60] 52 (09/08 0800) Resp:  [16-18] 17 (09/08 0535) BP: (107-112)/(70-92) 112/78 (09/08 0535) SpO2:  [98 %-100 %] 99 % (09/08 0800) Last BM Date: 10/14/20  Intake/Output from previous day: 09/07 0701 - 09/08 0700 In: 1080 [P.O.:1080] Out: -  Intake/Output this shift: No intake/output data recorded.  PE: Gen:  Alert, NAD, pleasant Lungs: Normal rate and effort  Abd: Soft, ND, nontender to palpation. Midline surgical scar with multiple hernias near the umbilicus that are soft and reducible with no overlying skin changes or tenderness to palpation. He also has a large right inguinal hernia, soft and reducible in the supine position Psych: A&Ox3  Skin: no rashes noted, warm and dry  Lab Results:  Recent Labs    10/13/20 0552 10/13/20 2124 10/14/20 0118  WBC 3.9*  --  3.9*  HGB 6.6* 8.2* 8.7*  HCT 19.1* 23.2* 24.6*  PLT 81*  --  77*   BMET Recent Labs    10/13/20 0552 10/14/20 0118  NA 133* 136  K 3.8 4.1  CL 105 107  CO2 22 22  GLUCOSE 84 82  BUN 26* 25*  CREATININE 1.09 1.15  CALCIUM 8.6* 9.1   PT/INR No results for input(s): LABPROT, INR in the last 72 hours. CMP     Component Value Date/Time   NA 136 10/14/2020 0118   K 4.1 10/14/2020 0118   CL 107 10/14/2020 0118   CO2 22 10/14/2020 0118   GLUCOSE 82 10/14/2020 0118   BUN 25 (H) 10/14/2020 0118   CREATININE 1.15 10/14/2020 0118   CALCIUM 9.1 10/14/2020 0118   PROT 6.2 (L) 10/13/2020 0552   ALBUMIN 3.3 (L) 10/13/2020 0552   AST 97 (H) 10/13/2020 0552   ALT 43 10/13/2020 0552   ALKPHOS 88 10/13/2020 0552   BILITOT 0.6 10/13/2020 0552   GFRNONAA >60 10/14/2020 0118   GFRAA >60 01/23/2019 1523   Lipase     Component Value Date/Time    LIPASE 23 10/12/2020 1944    Studies/Results: DG Abd Portable 1V-Small Bowel Obstruction Protocol-initial, 8 hr delay  Result Date: 10/14/2020 CLINICAL DATA:  Small-bowel obstruction EXAM: PORTABLE ABDOMEN - 1 VIEW COMPARISON:  CT 10/12/2020 FINDINGS: Multiple gas-filled dilated loops of small bowel are again seen throughout the mid abdomen in keeping with a underlying small-bowel obstruction. Intraluminal contrast is poorly visualized, likely diluted by extensive intraluminal fluid within the small bowel noted on prior CT examination. No gross free intraperitoneal gas. IMPRESSION: Persistent small bowel obstruction. Electronically Signed   By: Helyn Numbers M.D.   On: 10/14/2020 03:46    Anti-infectives: Anti-infectives (From admission, onward)    None        Assessment/Plan SBO Ventral Incisional Hernia (Prior hx of colon resection and later colostomy takedown) Right Inguinal Hernia - Patients hernias are soft, completely reducible and without skins changes on exam. Patient symptomatically has improved and has no abdominal pain or n/v. He is tolerating fld. He is passing flatus and had a BM this AM. Adv to soft diet. If tolerates, can be d/c'd from our standpoint. Can follow up in the office with one our surgeons that specializes in hernia's if he would like to discuss elective repair.  Counseled he should quit smoking. I have reached out to [facility] with recs. Please call back if we can be of any further help.    FEN - Soft VTE - SCDs, per [facility] ID - None   Vit B12 deficiency, ABL anemia - FOBT negative, received 2U PRBC HTN Tobacco use   LOS: 2 days    Jacinto Halim , [facility] Surgery 10/15/2020, 8:35 AM Please see Amion for pager number during day hours 7:00am-4:30pm

## 2020-10-15 NOTE — Plan of Care (Signed)

## 2020-10-15 NOTE — Progress Notes (Signed)
PROGRESS NOTE        PATIENT DETAILS Name: Sebastien Rafan Sanders Age: 55 y.o. Sex: male Date of Birth: [birth year] Admit Date: 10/12/2020 Admitting Physician Charlsie Quest, MD GLO:VFIEPPI, No Pcp Per (Inactive)  Brief Narrative: Patient is a 55 y.o. male with hx of HTN, chronic ventral incisional hernia-presented with nausea/vomiting/abdominal pain-he was found to have SBO and a hemoglobin of 6.4 subsequently admitted to the hospitalist service  Significant events: 9/5>> admit for SBO with a hemoglobin of 6.4.  Significant studies: 9/5>> vitamin B12:<50 9/5>> CT abdomen/pelvis: SBO related to infraumbilical hernia 9/6>> CXR: No pneumonia-mild pulmonary congestion.  Antimicrobial therapy: None  Microbiology data: 9/5>> COVID/influenza PCR: Negative  Procedures : None  Consults: General surgery  DVT Prophylaxis : SCDs Start: 10/13/20 0108   Subjective: Tolerating liquids-had a small BM earlier.  Feels better.   Assessment/Plan: SBO: Improving with supportive care-tolerating clear liquids-plan is to advance diet to soft diet yesterday-and if he does well-should be able to discharge home.    Macrocytic anemia due to severe vitamin B12 deficiency: Probably nutritional-given prior history of bowel resection.  Hemoglobin stable-s/p 2 units of PRBC-started on vitamin B12 supplementation.    Leukopenia/thrombocytopenia: Likely due to vitamin B12 deficiency-follow CBC.  HTN: BP stable-antihypertensives on hold.  Resume when able.  Obesity: Estimated body mass index is 32.74 kg/m as calculated from the following:   Height as of this encounter: 5\' 6"  (1.676 m).   Weight as of this encounter: 92 kg.    Diet: Diet Order             DIET SOFT Room service appropriate? Yes; Fluid consistency: Thin  Diet effective now                    Code Status: Full code   Family Communication: None at bedside  Disposition Plan: Status is:  Inpatient  Remains inpatient appropriate because:Inpatient level of care appropriate due to severity of illness  Dispo: The patient is from: Home              Anticipated d/c is to: Home              Patient currently is not medically stable to d/c.   Difficult to place patient No    Barriers to Discharge: SBO-severe symptomatic anemia-on IVF/NPO.  Not yet stable for discharge as bowel function has not come back yet.  Antimicrobial agents: Anti-infectives (From admission, onward)    None        Time spent: 25 minutes-Greater than 50% of this time was spent in counseling, explanation of diagnosis, planning of further management, and coordination of care.  MEDICATIONS: Scheduled Meds:  cyanocobalamin  1,000 mcg Intramuscular Daily   furosemide  20 mg Intravenous Once   Continuous Infusions:   PRN Meds:.acetaminophen, albuterol, morphine injection, ondansetron **OR** ondansetron (ZOFRAN) IV   PHYSICAL EXAM: Vital signs: Vitals:   10/14/20 1936 10/15/20 0535 10/15/20 0839 10/15/20 0902  BP:  112/78 111/80 109/79  Pulse:  (!) 56 (!) 52 (!) 56  Resp: 16 17 14 14   Temp:  98 F (36.7 C) 97.7 F (36.5 C) 97.9 F (36.6 C)  TempSrc: Oral  Oral Oral  SpO2:  98% 99% 100%  Weight:      Height:       Filed Weights   10/12/20 1937  Weight: 92 kg   Body mass index is 32.74 kg/m.   Gen Exam:Alert awake-not in any distress HEENT:atraumatic, normocephalic Chest: B/L clear to auscultation anteriorly CVS:S1S2 regular Abdomen:soft non tender, non distended Extremities:no edema Neurology: Non focal Skin: no rash   I have personally reviewed following labs and imaging studies  LABORATORY DATA: CBC: Recent Labs  Lab 10/12/20 1944 10/13/20 0552 10/13/20 2124 10/14/20 0118  WBC 3.8* 3.9*  --  3.9*  NEUTROABS 1.6*  --   --   --   HGB 6.4* 6.6* 8.2* 8.7*  HCT 18.9* 19.1* 23.2* 24.6*  MCV 121.9* 115.8*  --  108.4*  PLT 100* 81*  --  77*     Basic Metabolic  Panel: Recent Labs  Lab 10/12/20 1944 10/13/20 0552 10/14/20 0118  NA 133* 133* 136  K 3.8 3.8 4.1  CL 105 105 107  CO2 21* 22 22  GLUCOSE 96 84 82  BUN 25* 26* 25*  CREATININE 1.26* 1.09 1.15  CALCIUM 8.9 8.6* 9.1     GFR: Estimated Creatinine Clearance: 75.3 mL/min (by C-G formula based on SCr of 1.15 mg/dL).  Liver Function Tests: Recent Labs  Lab 10/12/20 1944 10/13/20 0552  AST 116* 97*  ALT 48* 43  ALKPHOS 90 88  BILITOT 0.8 0.6  PROT 6.8 6.2*  ALBUMIN 3.7 3.3*    Recent Labs  Lab 10/12/20 1944  LIPASE 23    No results for input(s): AMMONIA in the last 168 hours.  Coagulation Profile: No results for input(s): INR, PROTIME in the last 168 hours.  Cardiac Enzymes: No results for input(s): CKTOTAL, CKMB, CKMBINDEX, TROPONINI in the last 168 hours.  BNP (last 3 results) No results for input(s): PROBNP in the last 8760 hours.  Lipid Profile: No results for input(s): CHOL, HDL, LDLCALC, TRIG, CHOLHDL, LDLDIRECT in the last 72 hours.  Thyroid Function Tests: No results for input(s): TSH, T4TOTAL, FREET4, T3FREE, THYROIDAB in the last 72 hours.  Anemia Panel: Recent Labs    10/12/20 2204  VITAMINB12 <50*  FOLATE 18.5  FERRITIN 1,318*  TIBC 182*  IRON 148  RETICCTPCT 0.7     Urine analysis:    Component Value Date/Time   COLORURINE AMBER (A) 01/07/2015 1617   APPEARANCEUR CLEAR 01/07/2015 1617   LABSPEC 1.034 (H) 01/07/2015 1617   PHURINE 5.5 01/07/2015 1617   GLUCOSEU NEGATIVE 01/07/2015 1617   HGBUR NEGATIVE 01/07/2015 1617   BILIRUBINUR SMALL (A) 01/07/2015 1617   KETONESUR 15 (A) 01/07/2015 1617   PROTEINUR NEGATIVE 01/07/2015 1617   NITRITE NEGATIVE 01/07/2015 1617   LEUKOCYTESUR TRACE (A) 01/07/2015 1617    Sepsis Labs: Lactic Acid, Venous No results found for: LATICACIDVEN  MICROBIOLOGY: Recent Results (from the past 240 hour(s))  Resp Panel by RT-PCR (Flu A&B, Covid) Nasopharyngeal Swab     Status: None   Collection  Time: 10/12/20 11:59 PM   Specimen: Nasopharyngeal Swab; Nasopharyngeal(NP) swabs in vial transport medium  Result Value Ref Range Status   SARS Coronavirus 2 by RT PCR NEGATIVE NEGATIVE Final    Comment: (NOTE) SARS-CoV-2 target nucleic acids are NOT DETECTED.  The SARS-CoV-2 RNA is generally detectable in upper respiratory specimens during the acute phase of infection. The lowest concentration of SARS-CoV-2 viral copies this assay can detect is 138 copies/mL. A negative result does not preclude SARS-Cov-2 infection and should not be used as the sole basis for treatment or other patient management decisions. A negative result may occur with  improper specimen collection/handling, submission of  specimen other than nasopharyngeal swab, presence of viral mutation(s) within the areas targeted by this assay, and inadequate number of viral copies(<138 copies/mL). A negative result must be combined with clinical observations, patient history, and epidemiological information. The expected result is Negative.  Fact Sheet for Patients:  BloggerCourse.com  Fact Sheet for Healthcare Providers:  SeriousBroker.it  This test is no t yet approved or cleared by the [place] FDA and  has been authorized for detection and/or diagnosis of SARS-CoV-2 by FDA under an Emergency Use Authorization (EUA). This EUA will remain  in effect (meaning this test can be used) for the duration of the COVID-19 declaration under Section 564(b)(1) of the Act, 21 U.S.C.section 360bbb-3(b)(1), unless the authorization is terminated  or revoked sooner.       Influenza A by PCR NEGATIVE NEGATIVE Final   Influenza B by PCR NEGATIVE NEGATIVE Final    Comment: (NOTE) The Xpert Xpress SARS-CoV-2/FLU/RSV plus assay is intended as an aid in the diagnosis of influenza from Nasopharyngeal swab specimens and should not be used as a sole basis for treatment. Nasal washings  and aspirates are unacceptable for Xpert Xpress SARS-CoV-2/FLU/RSV testing.  Fact Sheet for Patients: BloggerCourse.com  Fact Sheet for Healthcare Providers: SeriousBroker.it  This test is not yet approved or cleared by the [place] FDA and has been authorized for detection and/or diagnosis of SARS-CoV-2 by FDA under an Emergency Use Authorization (EUA). This EUA will remain in effect (meaning this test can be used) for the duration of the COVID-19 declaration under Section 564(b)(1) of the Act, 21 U.S.C. section 360bbb-3(b)(1), unless the authorization is terminated or revoked.  Performed at [facility], [address], [place], [place] [address]     RADIOLOGY STUDIES/RESULTS: DG Abd Portable 1V-Small Bowel Obstruction Protocol-initial, 8 hr delay  Result Date: 10/14/2020 CLINICAL DATA:  Small-bowel obstruction EXAM: PORTABLE ABDOMEN - 1 VIEW COMPARISON:  CT 10/12/2020 FINDINGS: Multiple gas-filled dilated loops of small bowel are again seen throughout the mid abdomen in keeping with a underlying small-bowel obstruction. Intraluminal contrast is poorly visualized, likely diluted by extensive intraluminal fluid within the small bowel noted on prior CT examination. No gross free intraperitoneal gas. IMPRESSION: Persistent small bowel obstruction. Electronically Signed   By: Helyn Numbers M.D.   On: 10/14/2020 03:46     LOS: 2 days   Jeoffrey Massed, MD  [facility]    To contact the attending provider between 7A-7P or the covering provider during after hours 7P-7A, please log into the web site [email or web address] and access using universal [place] password for that web site. If you do not have the password, please call the hospital operator.  10/15/2020, 3:00 PM

## 2020-10-15 NOTE — Progress Notes (Signed)
Tolerated soft diet-Per nursing staff-patient wanting to go home today. See discharge summary for details.

## 2020-10-15 NOTE — Progress Notes (Signed)
Logan Nelson to be D/C'd per MD order. Discussed with the patient and all questions fully answered. ? VSS, Skin clean, dry and intact without evidence of skin break down, no evidence of skin tears noted. ? IV catheter discontinued intact. Site without signs and symptoms of complications. Dressing and pressure applied. ? An After Visit Summary was printed and given to the patient. Patient educated on how to draw up and administer B-12 injections. Patient verbalizes understanding and able to teach back. ? D/c education completed with patient/family including follow up instructions, medication list, d/c activities limitations if indicated, with other d/c instructions as indicated by MD - patient able to verbalize understanding, all questions fully answered.  ? Patient instructed to return to ED, call 911, or call MD for any changes in condition.  ? Patient to be escorted via WC, and D/C home via private auto.

## 2020-10-15 NOTE — TOC Progression Note (Addendum)
Transition of Care [facility]) - Progression Note    Patient Details  Name: Othello Dickenson MRN: [id number] Date of Birth: 18-Oct-1965  Transition of Care [facility]) CM/SW Contact  Flem Enderle, Adria Devon, RN Phone Number: 10/15/2020, 2:43 PM  Clinical Narrative:    First available hospital appointment follow up and to establish care is at Primary [facility] at [facility], November 04, 2020 at 1:30 pm.  MD asking if they can give weekly Vitamin  B12 injections prior to appointment.  NCM called spoke to Eve at [facility]. She will ask Dr Andrey Campanile if patient can receive injections before being seen at clinic. They do not have any appointments prior to September 18, if approved then she will see if their mobile clinic can give injections. Awaiting call back. They are unable to give Vitamin B12 Injections. Nurse at hospital will provide education on giving injections.   Entered in [facility] with no co pays   Expected Discharge Plan: Home/Self Care    Expected Discharge Plan and Services Expected Discharge Plan: Home/Self Care In-house Referral: Artist, PCP / [facility] Discharge Planning Services: CM Consult   Living arrangements for the past 2 months: Single Family Home                   DME Agency: NA       HH Arranged: NA HH Agency: NA         Social Determinants of Health (SDOH) Interventions    Readmission Risk Interventions No flowsheet data found.

## 2020-10-15 NOTE — Discharge Summary (Signed)
PATIENT DETAILS Name: Logan Nelson Age: 55 y.o. Sex: male Date of Birth: 03/19/1965 MRN: [id number]. Admitting Physician: Charlsie QuestVishal R Patel, MD LOV:FIEPPIRPCP:Patient, No Pcp Per (Inactive)  Admit Date: 10/12/2020 Discharge date: 10/15/2020  Recommendations for Outpatient Follow-up:  Follow up with PCP in 1-2 weeks Please obtain CMP/CBC in one week Please repeat vitamin B12 levels in 6 weeks.  Admitted From:  Home  Disposition: Home   Home Health: No  Equipment/Devices: None  Discharge Condition: Stable  CODE STATUS: FULL CODE  Diet recommendation:  Diet Order             DIET SOFT Room service appropriate? Yes; Fluid consistency: Thin  Diet effective now           Diet - low sodium heart healthy                    Brief Narrative: Patient is a 55 y.o. male with hx of HTN, chronic ventral incisional hernia-presented with nausea/vomiting/abdominal pain-he was found to have SBO and a hemoglobin of 6.4 subsequently admitted to the hospitalist service   Significant events: 9/5>> admit for SBO with a hemoglobin of 6.4.   Significant studies: 9/5>> vitamin B12:<50 9/5>> CT abdomen/pelvis: SBO related to infraumbilical hernia 9/6>> CXR: No pneumonia-mild pulmonary congestion.   Antimicrobial therapy: None   Microbiology data: 9/5>> COVID/influenza PCR: Negative   Procedures : None   Consults: General surgery  Brief Hospital Course: SBO: Managed with supportive care-diet gradually advanced-has tolerated soft diet this afternoon and is anxious to go home.  Followed by general surgery during this hospital stay as well.    Macrocytic anemia due to severe vitamin B12 deficiency: Probably nutritional-given prior history of bowel resection.  Hemoglobin stable-s/p 2 units of PRBC-started on vitamin B12 supplementation.  Patient does not have insurance-not does he have a primary care practitioner, he has been set up with a PCP on 9/28 where vitamin B12 levels should  be checked.  He will be taught how to inject vitamin B12 by the RN before his discharge (patient feels comfortable with this plan)-he will continue weekly shots of vitamin B12 for 4 weeks, then monthly thereafter.   Leukopenia/thrombocytopenia: Likely due to vitamin B12 deficiency-follow CBC.  PCP to follow CBC periodically.  If platelet count/WBC count do not improve following vitamin B12 supplementation-consider hematology referral.   HTN: BP stable-we will switch to low-dose amlodipine on discharge.  PCP to follow and optimize.   Obesity: Estimated body mass index is 32.74 kg/m as calculated from the following:   Height as of this encounter: 5\' 6"  (1.676 m).   Weight as of this encounter: 92 kg.     Procedures None  Discharge Diagnoses:  Principal Problem:   Small bowel obstruction (HCC) Active Problems:   Incisional hernia   Hypertension   Anemia due to vitamin B12 deficiency   Discharge Instructions:  Activity:  As tolerated   Discharge Instructions     Call MD for:  severe uncontrolled pain   Complete by: As directed    Diet - low sodium heart healthy   Complete by: As directed    Discharge instructions   Complete by: As directed    1.  You will need vitamin B12 injections indefinitely-please talk to your primary Care practitioner at your next visit.   Follow with Primary MD  Patient, No Pcp Per (Inactive) in 1-2 weeks  Please get a complete blood count and chemistry panel checked by your Primary MD  at your next visit, and again as instructed by your Primary MD.  Get Medicines reviewed and adjusted: Please take all your medications with you for your next visit with your Primary MD  Laboratory/radiological data: Please request your Primary MD to go over all hospital tests and procedure/radiological results at the follow up, please ask your Primary MD to get all Hospital records sent to his/her office.  In some cases, they will be blood work, cultures and biopsy  results pending at the time of your discharge. Please request that your primary care M.D. follows up on these results.  Also Note the following: If you experience worsening of your admission symptoms, develop shortness of breath, life threatening emergency, suicidal or homicidal thoughts you must seek medical attention immediately by calling 911 or calling your MD immediately  if symptoms less severe.  You must read complete instructions/literature along with all the possible adverse reactions/side effects for all the Medicines you take and that have been prescribed to you. Take any new Medicines after you have completely understood and accpet all the possible adverse reactions/side effects.   Do not drive when taking Pain medications or sleeping medications (Benzodaizepines)  Do not take more than prescribed Pain, Sleep and Anxiety Medications. It is not advisable to combine anxiety,sleep and pain medications without talking with your primary care practitioner  Special Instructions: If you have smoked or chewed Tobacco  in the last 2 yrs please stop smoking, stop any regular Alcohol  and or any Recreational drug use.  Wear Seat belts while driving.  Please note: You were cared for by a hospitalist during your hospital stay. Once you are discharged, your primary care physician will handle any further medical issues. Please note that NO REFILLS for any discharge medications will be authorized once you are discharged, as it is imperative that you return to your primary care physician (or establish a relationship with a primary care physician if you do not have one) for your post hospital discharge needs so that they can reassess your need for medications and monitor your lab values.   Increase activity slowly   Complete by: As directed       Allergies as of 10/15/2020   No Known Allergies      Medication List     STOP taking these medications    acetaminophen 325 MG tablet Commonly known  as: Tylenol   lisinopril-hydrochlorothiazide 10-12.5 MG tablet Commonly known as: ZESTORETIC   naproxen 375 MG tablet Commonly known as: NAPROSYN       TAKE these medications    amLODipine 2.5 MG tablet Commonly known as: NORVASC Take 1 tablet (2.5 mg total) by mouth daily.   cyanocobalamin 1000 MCG/ML injection Commonly known as: (VITAMIN B-12) Inject 1 mL (1,000 mcg total) into the muscle once a week. Once a week for 4 weeks, then once a month thereafter.        Follow-up Information     [name], MD Follow up.   Specialty: Family Medicine Why: November 04, 2020 at 1:30 pm Contact information: [address] [place] [address] [phone]         Stechschulte, Hyman Hopes, MD. Schedule an appointment as soon as possible for a visit.   Specialty: Surgery Why: If you would like to discuss elective repair of your hernia's Contact information: [address] [place] [address] [phone]                No  Known Allergies    Consultations:  CCS   Other Procedures/Studies: CT ABDOMEN PELVIS W CONTRAST  Result Date: 10/12/2020 CLINICAL DATA:  Acute abdominal pain.  Hernia.  Query obstruction. EXAM: CT ABDOMEN AND PELVIS WITH CONTRAST TECHNIQUE: Multidetector CT imaging of the abdomen and pelvis was performed using the standard protocol following bolus administration of intravenous contrast. CONTRAST:  78mL OMNIPAQUE IOHEXOL 350 MG/ML SOLN COMPARISON:  CT 01/07/2015 FINDINGS: Lower chest: Mild heterogeneous pulmonary parenchyma at the lung bases. No acute airspace disease or pleural effusion. Upper normal heart size. Hepatobiliary: Diffusely decreased hepatic density typical of steatosis. Subcapsular cyst in the medial right hepatic lobe. No suspicious liver lesion. Gallbladder physiologically distended, no calcified stone. No biliary dilatation. Pancreas: Grossly negative, partially obscured by adjacent bowel loops. No  evidence of pancreatic inflammation. Spleen: Normal in size without focal abnormality. Adrenals/Urinary Tract: No adrenal nodule. No hydronephrosis or perinephric edema. Homogeneous renal enhancement with symmetric excretion on delayed phase imaging. No evidence of focal renal abnormality or stone. Urinary bladder is partially distended without wall thickening. Stomach/Bowel: Mid and upper abdominal small bowel are diffusely dilated and fluid-filled. Complex umbilical hernia with a bilobed appearance, bilobed hernia defect both above and separate hernia below below the umbilicus. The more superior hernia defect involves short segment of transverse colon, without evidence of colonic obstruction. There is fluid within the cecum and proximal transverse colon, but no abnormal distension, in the distal small bowel is decompressed. The more inferior hernia contains a loop of small bowel, with the exiting small bowel being decompressed, likely cause of obstruction. Small bowel proximal to this is diffusely dilated and fluid-filled. The stomach is distended with ingested contents. There is also a right inguinal hernia, not entirely included in the field of view, however appears to contain nonobstructed or inflamed small bowel. The terminal ileum is difficult to define. Transverse colon is nondistended. Fluid within the sigmoid colon. No evidence of bowel pneumatosis, significant mesenteric edema, or inflammation. No perforation. Appendix not visualized. Vascular/Lymphatic: Normal caliber abdominal aorta. Patent portal vein. No abdominopelvic adenopathy. Reproductive: Prominent prostate gland spans 5.2 cm transverse. Other: Abdominal wall hernias as described.  No free air or ascites. Musculoskeletal: L5-S1 degenerative disc disease. Areas of endplate sclerosis involving the lower thoracic and lumbar spine are felt to be degenerative. There are no acute or suspicious osseous abnormalities. IMPRESSION: 1. Small bowel  obstruction related to infraumbilical hernia. No evidence of ischemia or perforation. 2. There is an adjacent bilobed supraumbilical hernia containing transverse colon. No obstruction related to this hernia. 3. Right inguinal hernia, not entirely included in the field of view, however appears to contain nonobstructed or inflamed small bowel. 4. Hepatic steatosis. 5. Enlarged prostate gland. Electronically Signed   By: Narda Rutherford M.D.   On: 10/12/2020 23:10   DG CHEST PORT 1 VIEW  Result Date: 10/13/2020 CLINICAL DATA:  Hemoptysis EXAM: PORTABLE CHEST 1 VIEW COMPARISON:  07/10/2019 FINDINGS: Unchanged mild cardiomegaly. Interval improvement of pulmonary vascular congestion. Lungs are clear. Advanced degenerative changes of the right glenohumeral joint. IMPRESSION: Unchanged mild cardiomegaly. Interval improvement of pulmonary vascular congestion. Electronically Signed   By: Acquanetta Belling M.D.   On: 10/13/2020 07:54   DG Abd Portable 1V-Small Bowel Obstruction Protocol-initial, 8 hr delay  Result Date: 10/14/2020 CLINICAL DATA:  Small-bowel obstruction EXAM: PORTABLE ABDOMEN - 1 VIEW COMPARISON:  CT 10/12/2020 FINDINGS: Multiple gas-filled dilated loops of small bowel are again seen throughout the mid abdomen in keeping with a underlying small-bowel obstruction. Intraluminal contrast  is poorly visualized, likely diluted by extensive intraluminal fluid within the small bowel noted on prior CT examination. No gross free intraperitoneal gas. IMPRESSION: Persistent small bowel obstruction. Electronically Signed   By: Helyn Numbers M.D.   On: 10/14/2020 03:46     TODAY-DAY OF DISCHARGE:  Subjective:   Logan Nelson today has no headache,no chest abdominal pain,no new weakness tingling or numbness, feels much better wants to go home today.  Objective:   Blood pressure 109/79, pulse (!) 56, temperature 97.9 F (36.6 C), temperature source Oral, resp. rate 14, height  (1.676 m), weight 92 kg,  SpO2 100 %.  Intake/Output Summary (Last 24 hours) at 10/15/2020 1523 Last data filed at 10/15/2020 1100 Gross per 24 hour  Intake 680 ml  Output --  Net 680 ml   Filed Weights   10/12/20 1937  Weight: 92 kg    Exam: Awake Alert, Oriented *3, No new F.N deficits, Normal affect [place].AT,PERRAL Supple Neck,No JVD, No cervical lymphadenopathy appriciated.  Symmetrical Chest wall movement, Good air movement bilaterally, CTAB RRR,No Gallops,Rubs or new Murmurs, No Parasternal Heave +ve B.Sounds, Abd Soft, Non tender, No organomegaly appriciated, No rebound -guarding or rigidity. No Cyanosis, Clubbing or edema, No new Rash or bruise   PERTINENT RADIOLOGIC STUDIES: DG Abd Portable 1V-Small Bowel Obstruction Protocol-initial, 8 hr delay  Result Date: 10/14/2020 CLINICAL DATA:  Small-bowel obstruction EXAM: PORTABLE ABDOMEN - 1 VIEW COMPARISON:  CT 10/12/2020 FINDINGS: Multiple gas-filled dilated loops of small bowel are again seen throughout the mid abdomen in keeping with a underlying small-bowel obstruction. Intraluminal contrast is poorly visualized, likely diluted by extensive intraluminal fluid within the small bowel noted on prior CT examination. No gross free intraperitoneal gas. IMPRESSION: Persistent small bowel obstruction. Electronically Signed   By: Helyn Numbers M.D.   On: 10/14/2020 03:46     PERTINENT LAB RESULTS: CBC: Recent Labs    10/13/20 0552 10/13/20 2124 10/14/20 0118  WBC 3.9*  --  3.9*  HGB 6.6* 8.2* 8.7*  HCT 19.1* 23.2* 24.6*  PLT 81*  --  77*   CMET CMP     Component Value Date/Time   NA 136 10/14/2020 0118   K 4.1 10/14/2020 0118   CL 107 10/14/2020 0118   CO2 22 10/14/2020 0118   GLUCOSE 82 10/14/2020 0118   BUN 25 (H) 10/14/2020 0118   CREATININE 1.15 10/14/2020 0118   CALCIUM 9.1 10/14/2020 0118   PROT 6.2 (L) 10/13/2020 0552   ALBUMIN 3.3 (L) 10/13/2020 0552   AST 97 (H) 10/13/2020 0552   ALT 43 10/13/2020 0552   ALKPHOS 88 10/13/2020 0552    BILITOT 0.6 10/13/2020 0552   GFRNONAA >60 10/14/2020 0118   GFRAA >60 01/23/2019 1523    GFR Estimated Creatinine Clearance: 75.3 mL/min (by C-G formula based on SCr of 1.15 mg/dL). Recent Labs    10/12/20 1944  LIPASE 23   No results for input(s): CKTOTAL, CKMB, CKMBINDEX, TROPONINI in the last 72 hours. Invalid input(s): POCBNP No results for input(s): DDIMER in the last 72 hours. No results for input(s): HGBA1C in the last 72 hours. No results for input(s): CHOL, HDL, LDLCALC, TRIG, CHOLHDL, LDLDIRECT in the last 72 hours. No results for input(s): TSH, T4TOTAL, T3FREE, THYROIDAB in the last 72 hours.  Invalid input(s): FREET3 Recent Labs    10/12/20 2204  VITAMINB12 <50*  FOLATE 18.5  FERRITIN 1,318*  TIBC 182*  IRON 148  RETICCTPCT 0.7   Coags: No results for input(s): INR  in the last 72 hours.  Invalid input(s): PT Microbiology: Recent Results (from the past 240 hour(s))  Resp Panel by RT-PCR (Flu A&B, Covid) Nasopharyngeal Swab     Status: None   Collection Time: 10/12/20 11:59 PM   Specimen: Nasopharyngeal Swab; Nasopharyngeal(NP) swabs in vial transport medium  Result Value Ref Range Status   SARS Coronavirus 2 by RT PCR NEGATIVE NEGATIVE Final    Comment: (NOTE) SARS-CoV-2 target nucleic acids are NOT DETECTED.  The SARS-CoV-2 RNA is generally detectable in upper respiratory specimens during the acute phase of infection. The lowest concentration of SARS-CoV-2 viral copies this assay can detect is 138 copies/mL. A negative result does not preclude SARS-Cov-2 infection and should not be used as the sole basis for treatment or other patient management decisions. A negative result may occur with  improper specimen collection/handling, submission of specimen other than nasopharyngeal swab, presence of viral mutation(s) within the areas targeted by this assay, and inadequate number of viral copies(<138 copies/mL). A negative result must be combined  with clinical observations, patient history, and epidemiological information. The expected result is Negative.  Fact Sheet for Patients:  BloggerCourse.com  Fact Sheet for Healthcare Providers:  SeriousBroker.it  This test is no t yet approved or cleared by the [place] FDA and  has been authorized for detection and/or diagnosis of SARS-CoV-2 by FDA under an Emergency Use Authorization (EUA). This EUA will remain  in effect (meaning this test can be used) for the duration of the COVID-19 declaration under Section 564(b)(1) of the Act, 21 U.S.C.section 360bbb-3(b)(1), unless the authorization is terminated  or revoked sooner.       Influenza A by PCR NEGATIVE NEGATIVE Final   Influenza B by PCR NEGATIVE NEGATIVE Final    Comment: (NOTE) The Xpert Xpress SARS-CoV-2/FLU/RSV plus assay is intended as an aid in the diagnosis of influenza from Nasopharyngeal swab specimens and should not be used as a sole basis for treatment. Nasal washings and aspirates are unacceptable for Xpert Xpress SARS-CoV-2/FLU/RSV testing.  Fact Sheet for Patients: BloggerCourse.com  Fact Sheet for Healthcare Providers: SeriousBroker.it  This test is not yet approved or cleared by the [place] FDA and has been authorized for detection and/or diagnosis of SARS-CoV-2 by FDA under an Emergency Use Authorization (EUA). This EUA will remain in effect (meaning this test can be used) for the duration of the COVID-19 declaration under Section 564(b)(1) of the Act, 21 U.S.C. section 360bbb-3(b)(1), unless the authorization is terminated or revoked.  Performed at [facility], [address] [place], [place] [address]     FURTHER DISCHARGE INSTRUCTIONS:  Get Medicines reviewed and adjusted: Please take all your medications with you for your next visit with your Primary  MD  Laboratory/radiological data: Please request your Primary MD to go over all hospital tests and procedure/radiological results at the follow up, please ask your Primary MD to get all Hospital records sent to his/her office.  In some cases, they will be blood work, cultures and biopsy results pending at the time of your discharge. Please request that your primary care M.D. goes through all the records of your hospital data and follows up on these results.  Also Note the following: If you experience worsening of your admission symptoms, develop shortness of breath, life threatening emergency, suicidal or homicidal thoughts you must seek medical attention immediately by calling 911 or calling your MD immediately  if symptoms less severe.  You must read complete instructions/literature along with all the  possible adverse reactions/side effects for all the Medicines you take and that have been prescribed to you. Take any new Medicines after you have completely understood and accpet all the possible adverse reactions/side effects.   Do not drive when taking Pain medications or sleeping medications (Benzodaizepines)  Do not take more than prescribed Pain, Sleep and Anxiety Medications. It is not advisable to combine anxiety,sleep and pain medications without talking with your primary care practitioner  Special Instructions: If you have smoked or chewed Tobacco  in the last 2 yrs please stop smoking, stop any regular Alcohol  and or any Recreational drug use.  Wear Seat belts while driving.  Please note: You were cared for by a hospitalist during your hospital stay. Once you are discharged, your primary care physician will handle any further medical issues. Please note that NO REFILLS for any discharge medications will be authorized once you are discharged, as it is imperative that you return to your primary care physician (or establish a relationship with a primary care physician if you do not have  one) for your post hospital discharge needs so that they can reassess your need for medications and monitor your lab values.  Total Time spent coordinating discharge including counseling, education and face to face time equals 35 minutes.  SignedJeoffrey Massed 10/15/2020 3:23 PM

## 2020-11-04 ENCOUNTER — Ambulatory Visit (INDEPENDENT_AMBULATORY_CARE_PROVIDER_SITE_OTHER): Payer: Self-pay | Admitting: Family Medicine

## 2020-11-04 ENCOUNTER — Other Ambulatory Visit: Payer: Self-pay

## 2020-11-04 ENCOUNTER — Encounter: Payer: Self-pay | Admitting: Family Medicine

## 2020-11-04 VITALS — BP 118/77 | HR 48 | Temp 97.6°F | Resp 16 | Ht 67.2 in | Wt 171.2 lb

## 2020-11-04 DIAGNOSIS — R001 Bradycardia, unspecified: Secondary | ICD-10-CM

## 2020-11-04 DIAGNOSIS — I10 Essential (primary) hypertension: Secondary | ICD-10-CM

## 2020-11-04 DIAGNOSIS — D519 Vitamin B12 deficiency anemia, unspecified: Secondary | ICD-10-CM

## 2020-11-04 DIAGNOSIS — Z7689 Persons encountering health services in other specified circumstances: Secondary | ICD-10-CM

## 2020-11-04 MED ORDER — AMLODIPINE BESYLATE 2.5 MG PO TABS
2.5000 mg | ORAL_TABLET | Freq: Every day | ORAL | 0 refills | Status: DC
  Filled 2020-11-04: qty 90, 90d supply, fill #0
  Filled 2020-11-06: qty 30, 30d supply, fill #0

## 2020-11-04 MED ORDER — CYANOCOBALAMIN 1000 MCG/ML IJ SOLN
1000.0000 ug | INTRAMUSCULAR | 0 refills | Status: DC
  Filled 2020-11-04: qty 10, 70d supply, fill #0
  Filled 2020-11-06: qty 4, 28d supply, fill #0

## 2020-11-04 NOTE — Progress Notes (Signed)
New Patient Office Visit  Subjective:  Patient ID: Logan Nelson, male    DOB: 26-May-1965  Age: 55 y.o. MRN: [id number]  CC:  Chief Complaint  Patient presents with   Establish Care   Follow-up   Hypertension   Medication Refill    HPI Logan Nelson presents for establish care.  Patient was recently hospitalized and noted to have macrocytic anemia secondary to severe vitamin B12 deficiency.  Patient reports that he has been unable to give the medication as recommended for the past 2 weeks because he initially wasted a couple of doses and not knowing how to give the medications.  He now knows how to give himself the medication.  Past Medical History:  Diagnosis Date   Arthritis    Hernia of abdominal wall    Hypertension     Past Surgical History:  Procedure Laterality Date   ABDOMINAL SURGERY      History reviewed. No pertinent family history.  Social History   Socioeconomic History   Marital status: Single    Spouse name: Not on file   Number of children: Not on file   Years of education: Not on file   Highest education level: Not on file  Occupational History   Not on file  Tobacco Use   Smoking status: Former    Types: Cigarettes   Smokeless tobacco: Never  Vaping Use   Vaping Use: Never used  Substance and Sexual Activity   Alcohol use: Not Currently   Drug use: Not Currently   Sexual activity: Not Currently  Other Topics Concern   Not on file  Social History Narrative   ** Merged History Encounter **       Social Determinants of Health   Financial Resource Strain: Not on file  Food Insecurity: Not on file  Transportation Needs: Not on file  Physical Activity: Not on file  Stress: Not on file  Social Connections: Not on file  Intimate Partner Violence: Not on file    ROS Review of Systems  Constitutional:  Negative for fatigue.  Respiratory:  Negative for shortness of breath.   Cardiovascular:  Negative for chest pain and leg  swelling.  All other systems reviewed and are negative.  Objective:   Today's Vitals: BP 118/77 (BP Location: Right Arm, Patient Position: Sitting, Cuff Size: Normal)   Pulse (!) 48   Temp 97.6 F (36.4 C) (Oral)   Resp 16   Ht 5' 7.2" (1.707 m)   Wt 171 lb 3.2 oz (77.7 kg)   SpO2 99%   BMI 26.65 kg/m   Physical Exam Vitals and nursing note reviewed.  Constitutional:      General: He is not in acute distress. Cardiovascular:     Rate and Rhythm: Regular rhythm. Bradycardia present.  Pulmonary:     Effort: Pulmonary effort is normal.     Breath sounds: Normal breath sounds.  Abdominal:     Palpations: Abdomen is soft.     Tenderness: There is no abdominal tenderness.  Musculoskeletal:     Right lower leg: No edema.     Left lower leg: No edema.  Neurological:     General: No focal deficit present.     Mental Status: He is alert and oriented to person, place, and time.    Assessment & Plan:   1. Primary hypertension Appears stable with present management.  Continue - amLODipine (NORVASC) 2.5 MG tablet; Take 1 tablet (2.5 mg total) by mouth daily.  Dispense: 90 tablet; Refill: 0  2. Anemia due to vitamin B12 deficiency, unspecified B12 deficiency type Medication refill.  Patient to follow-up for monitoring labs in 1 month as recommended. - cyanocobalamin (,VITAMIN B-12,) 1000 MCG/ML injection; Inject 1 mL (1,000 mcg total) into the muscle once a week. Once a week for 4 weeks, then once a month thereafter.  Dispense: 10 mL; Refill: 0  3. Bradycardia Low heart rate persist as noted on discharge summary Hospital notes and here.  Patient referred to cardiology for further evaluation and management. - Ambulatory referral to Cardiology  4. Encounter to establish care     Outpatient Encounter Medications as of 11/04/2020  Medication Sig   amLODipine (NORVASC) 2.5 MG tablet Take 1 tablet (2.5 mg total) by mouth daily.   cyanocobalamin (,VITAMIN B-12,) 1000 MCG/ML  injection Inject 1 mL (1,000 mcg total) into the muscle once a week. Once a week for 4 weeks, then once a month thereafter.   No facility-administered encounter medications on file as of 11/04/2020.    Follow-up: No follow-ups on file.   Tommie Raymond, MD

## 2020-11-05 ENCOUNTER — Encounter: Payer: Self-pay | Admitting: Family Medicine

## 2020-11-05 ENCOUNTER — Telehealth: Payer: Self-pay | Admitting: Family Medicine

## 2020-11-05 ENCOUNTER — Other Ambulatory Visit: Payer: Self-pay

## 2020-11-05 NOTE — Telephone Encounter (Signed)
Patient called in stating heart pills were never sent to the pharmacy. Patient is asking that the medication be sent to [facility]

## 2020-11-05 NOTE — Telephone Encounter (Signed)
Patient request her medication

## 2020-11-05 NOTE — Telephone Encounter (Signed)
Created in error

## 2020-11-06 ENCOUNTER — Other Ambulatory Visit: Payer: Self-pay

## 2020-11-09 ENCOUNTER — Other Ambulatory Visit: Payer: Self-pay

## 2020-12-02 ENCOUNTER — Ambulatory Visit: Payer: Self-pay | Admitting: Family Medicine

## 2020-12-03 ENCOUNTER — Telehealth: Payer: Self-pay | Admitting: Family Medicine

## 2020-12-04 ENCOUNTER — Ambulatory Visit: Payer: Self-pay | Admitting: Family Medicine

## 2020-12-08 ENCOUNTER — Ambulatory Visit (INDEPENDENT_AMBULATORY_CARE_PROVIDER_SITE_OTHER): Payer: Self-pay | Admitting: Family Medicine

## 2020-12-08 ENCOUNTER — Encounter: Payer: Self-pay | Admitting: Family Medicine

## 2020-12-08 ENCOUNTER — Other Ambulatory Visit: Payer: Self-pay

## 2020-12-08 VITALS — BP 115/80 | HR 56 | Temp 98.0°F | Resp 16 | Wt 161.6 lb

## 2020-12-08 DIAGNOSIS — R001 Bradycardia, unspecified: Secondary | ICD-10-CM

## 2020-12-08 DIAGNOSIS — D519 Vitamin B12 deficiency anemia, unspecified: Secondary | ICD-10-CM

## 2020-12-08 DIAGNOSIS — I10 Essential (primary) hypertension: Secondary | ICD-10-CM

## 2020-12-08 NOTE — Progress Notes (Signed)
   Established Patient Office Visit  Subjective:  Patient ID: Logan Nelson, male    DOB: 03-Mar-1965  Age: 55 y.o. MRN: [id number]  CC:  Chief Complaint  Patient presents with   Follow-up    HPI [name] presents for follow up of hypertension and vitamin B12 deficiency. Patient denies acute complaints.   Past Medical History:  Diagnosis Date   Arthritis    Hernia of abdominal wall    Hypertension     Past Surgical History:  Procedure Laterality Date   ABDOMINAL SURGERY      No family history on file.  Social History   Socioeconomic History   Marital status: Single    Spouse name: Not on file   Number of children: Not on file   Years of education: Not on file   Highest education level: Not on file  Occupational History   Not on file  Tobacco Use   Smoking status: Former    Types: Cigarettes   Smokeless tobacco: Never  Vaping Use   Vaping Use: Never used  Substance and Sexual Activity   Alcohol use: Not Currently   Drug use: Not Currently   Sexual activity: Not Currently  Other Topics Concern   Not on file  Social History Narrative   ** Merged History Encounter **       Social Determinants of Health   Financial Resource Strain: Not on file  Food Insecurity: Not on file  Transportation Needs: Not on file  Physical Activity: Not on file  Stress: Not on file  Social Connections: Not on file  Intimate Partner Violence: Not on file    ROS Review of Systems  All other systems reviewed and are negative.  Objective:   Today's Vitals: BP 115/80   Pulse (!) 56   Temp 98 F (36.7 C) (Oral)   Resp 16   Wt 161 lb 9.6 oz (73.3 kg)   SpO2 99%   BMI 25.16 kg/m   Physical Exam Vitals and nursing note reviewed.  Constitutional:      General: He is not in acute distress. Cardiovascular:     Rate and Rhythm: Regular rhythm. Bradycardia present.  Pulmonary:     Effort: Pulmonary effort is normal.     Breath sounds: Normal breath  sounds.  Abdominal:     Palpations: Abdomen is soft.     Tenderness: There is no abdominal tenderness.  Musculoskeletal:     Right lower leg: No edema.     Left lower leg: No edema.  Neurological:     General: No focal deficit present.     Mental Status: He is alert and oriented to person, place, and time.    Assessment & Plan:   1. Bradycardia Some improvement in rate off of amlodipine. Keep scheduled appt with consultation.   2. Anemia due to vitamin B12 deficiency, unspecified B12 deficiency type Monitoring labs ordered. - CMP14+EGFR - CBC with Differential - Vitamin B12  3. Primary hypertension Readings at goal. Continue off amlodipine and monitor - CMP14+EGFR    Outpatient Encounter Medications as of 12/08/2020  Medication Sig   amLODipine (NORVASC) 2.5 MG tablet Take 1 tablet (2.5 mg total) by mouth daily.   cyanocobalamin (,VITAMIN B-12,) 1000 MCG/ML injection Inject 1 mL Once a week for 4 weeks, then once a month thereafter.   No facility-administered encounter medications on file as of 12/08/2020.    Follow-up: No follow-ups on file.   Becky Sax, MD

## 2020-12-09 ENCOUNTER — Encounter: Payer: Self-pay | Admitting: Family Medicine

## 2020-12-09 LAB — CMP14+EGFR
ALT: 17 IU/L (ref 0–44)
AST: 15 IU/L (ref 0–40)
Albumin/Globulin Ratio: 1.3 (ref 1.2–2.2)
Albumin: 3.8 g/dL (ref 3.8–4.9)
Alkaline Phosphatase: 122 IU/L — ABNORMAL HIGH (ref 44–121)
BUN/Creatinine Ratio: 15 (ref 9–20)
BUN: 14 mg/dL (ref 6–24)
Bilirubin Total: 0.2 mg/dL (ref 0.0–1.2)
CO2: 17 mmol/L — ABNORMAL LOW (ref 20–29)
Calcium: 8.8 mg/dL (ref 8.7–10.2)
Chloride: 116 mmol/L (ref 96–106)
Creatinine, Ser: 0.96 mg/dL (ref 0.76–1.27)
Globulin, Total: 2.9 g/dL (ref 1.5–4.5)
Glucose: 79 mg/dL (ref 70–99)
Potassium: 5.1 mmol/L (ref 3.5–5.2)
Sodium: 143 mmol/L (ref 134–144)
Total Protein: 6.7 g/dL (ref 6.0–8.5)
eGFR: 92 mL/min/{1.73_m2} (ref >59)

## 2020-12-09 LAB — CBC WITH DIFFERENTIAL/PLATELET
Basophils Absolute: 0 10*3/uL (ref 0.0–0.2)
Basos: 1 %
EOS (ABSOLUTE): 0.1 10*3/uL (ref 0.0–0.4)
Eos: 2 %
Hematocrit: 38.6 % (ref 37.5–51.0)
Hemoglobin: 12.3 g/dL — ABNORMAL LOW (ref 13.0–17.7)
Immature Grans (Abs): 0 10*3/uL (ref 0.0–0.1)
Immature Granulocytes: 0 %
Lymphocytes Absolute: 1.7 10*3/uL (ref 0.7–3.1)
Lymphs: 38 %
MCH: 29.7 pg (ref 26.6–33.0)
MCHC: 31.9 g/dL (ref 31.5–35.7)
MCV: 93 fL (ref 79–97)
Monocytes Absolute: 0.5 10*3/uL (ref 0.1–0.9)
Monocytes: 12 %
Neutrophils Absolute: 2.1 10*3/uL (ref 1.4–7.0)
Neutrophils: 47 %
Platelets: 259 10*3/uL (ref 150–450)
RBC: 4.14 x10E6/uL (ref 4.14–5.80)
RDW: 12.7 % (ref 11.6–15.4)
WBC: 4.4 10*3/uL (ref 3.4–10.8)

## 2020-12-09 LAB — VITAMIN B12: Vitamin B-12: 730 pg/mL (ref 232–1245)

## 2020-12-17 ENCOUNTER — Emergency Department (HOSPITAL_COMMUNITY): Payer: Self-pay

## 2020-12-17 ENCOUNTER — Other Ambulatory Visit: Payer: Self-pay

## 2020-12-17 ENCOUNTER — Emergency Department (HOSPITAL_COMMUNITY)
Admission: EM | Admit: 2020-12-17 | Discharge: 2020-12-17 | Disposition: A | Discharge status: Home / Self Care | Payer: Self-pay | Attending: Emergency Medicine | Admitting: Emergency Medicine

## 2020-12-17 ENCOUNTER — Encounter (HOSPITAL_COMMUNITY): Payer: Self-pay | Admitting: Oncology

## 2020-12-17 DIAGNOSIS — I10 Essential (primary) hypertension: Secondary | ICD-10-CM | POA: Insufficient documentation

## 2020-12-17 DIAGNOSIS — Z79899 Other long term (current) drug therapy: Secondary | ICD-10-CM | POA: Insufficient documentation

## 2020-12-17 DIAGNOSIS — K760 Fatty (change of) liver, not elsewhere classified: Secondary | ICD-10-CM | POA: Insufficient documentation

## 2020-12-17 DIAGNOSIS — Z87891 Personal history of nicotine dependence: Secondary | ICD-10-CM | POA: Insufficient documentation

## 2020-12-17 DIAGNOSIS — K436 Other and unspecified ventral hernia with obstruction, without gangrene: Secondary | ICD-10-CM | POA: Insufficient documentation

## 2020-12-17 LAB — COMPREHENSIVE METABOLIC PANEL
ALT: 27 U/L (ref 0–44)
AST: 27 U/L (ref 15–41)
Albumin: 4.3 g/dL (ref 3.5–5.0)
Alkaline Phosphatase: 114 U/L (ref 38–126)
Anion gap: 9 (ref 5–15)
BUN: 21 mg/dL — ABNORMAL HIGH (ref 6–20)
CO2: 30 mmol/L (ref 22–32)
Calcium: 9.2 mg/dL (ref 8.9–10.3)
Chloride: 99 mmol/L (ref 98–111)
Creatinine, Ser: 1.05 mg/dL (ref 0.61–1.24)
GFR, Estimated: >60 mL/min (ref >60)
Glucose, Bld: 103 mg/dL — ABNORMAL HIGH (ref 70–99)
Potassium: 3.6 mmol/L (ref 3.5–5.1)
Sodium: 138 mmol/L (ref 135–145)
Total Bilirubin: 0.5 mg/dL (ref 0.3–1.2)
Total Protein: 8.2 g/dL — ABNORMAL HIGH (ref 6.5–8.1)

## 2020-12-17 LAB — CBC
HCT: 43.2 % (ref 39.0–52.0)
Hemoglobin: 14.1 g/dL (ref 13.0–17.0)
MCH: 29.3 pg (ref 26.0–34.0)
MCHC: 32.6 g/dL (ref 30.0–36.0)
MCV: 89.8 fL (ref 80.0–100.0)
Platelets: 310 10*3/uL (ref 150–400)
RBC: 4.81 MIL/uL (ref 4.22–5.81)
RDW: 14.2 % (ref 11.5–15.5)
WBC: 6.8 10*3/uL (ref 4.0–10.5)
nRBC: 0 % (ref 0.0–0.2)

## 2020-12-17 LAB — LIPASE, BLOOD: Lipase: 28 U/L (ref 11–51)

## 2020-12-17 IMAGING — CT CT ABD-PELV W/ CM
2 of 5 series · 16 of 46 positions shown, 18 images · IV contrast (OMNIPAQUE 350)
Comparison: [DATE]

CLINICAL DATA: Bowel obstruction suspected

EXAM:
CT ABDOMEN AND PELVIS WITH CONTRAST
TECHNIQUE: Multidetector CT imaging of the abdomen and pelvis was performed
using the standard protocol following bolus administration of
intravenous contrast.
CONTRAST:  75mL OMNIPAQUE IOHEXOL 350 MG/ML SOLN

[Series 2: axial st · axial · 0.79mm/px · z∈[-515,-50]mm · 13 of 109 slices shown, 15 images]
[im 8/109  soft-tissue]
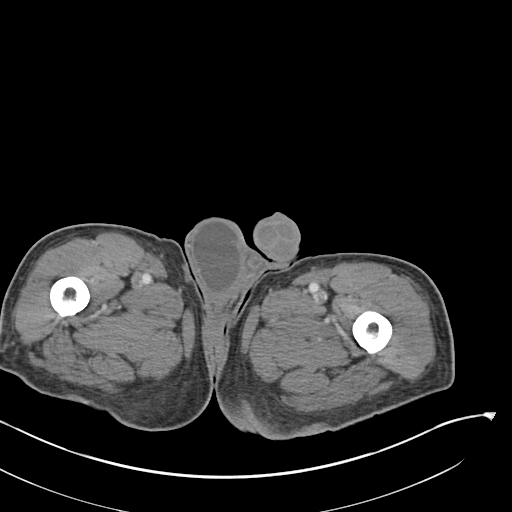
[im 8/109  bone]
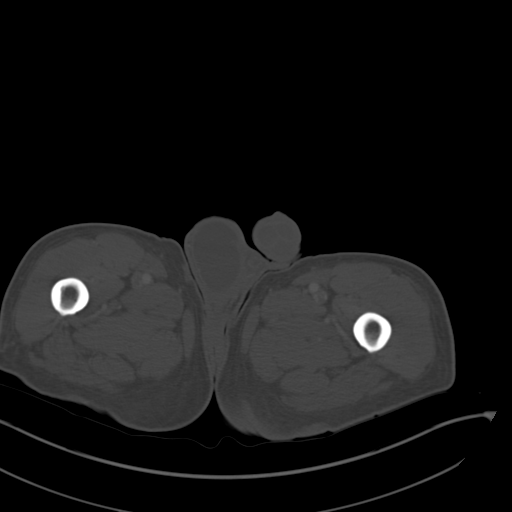
[im 15/109  soft-tissue]
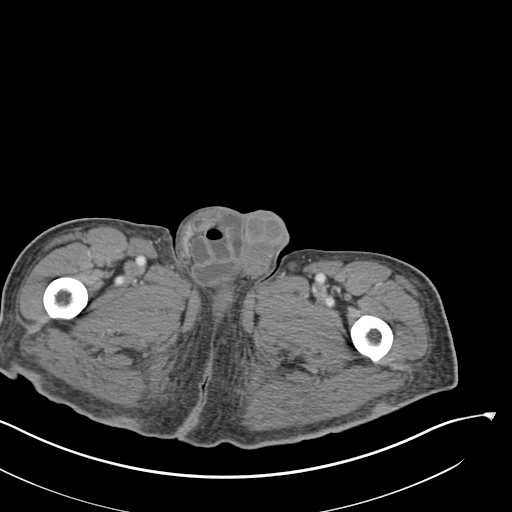
[im 22/109  soft-tissue]
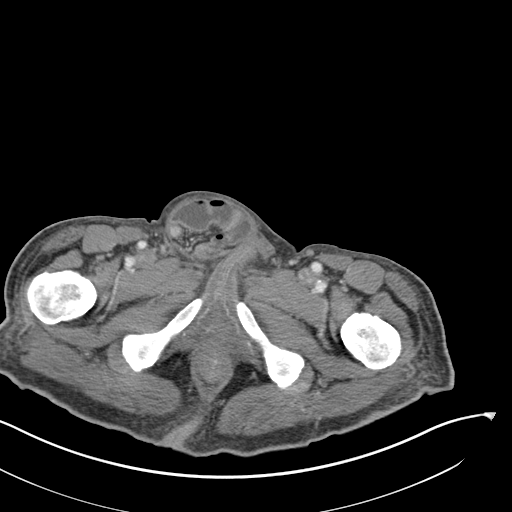
[im 29/109  soft-tissue]
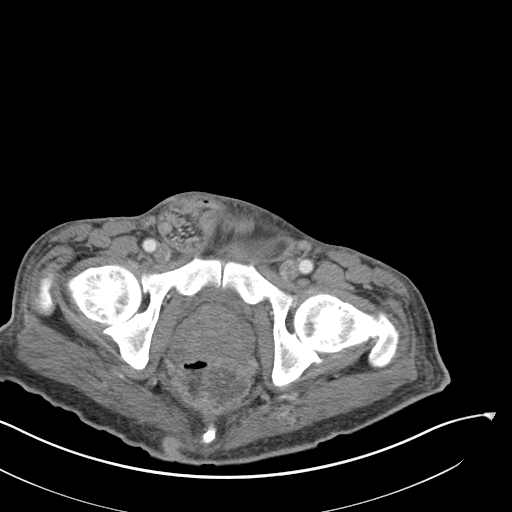
[im 37/109  soft-tissue]
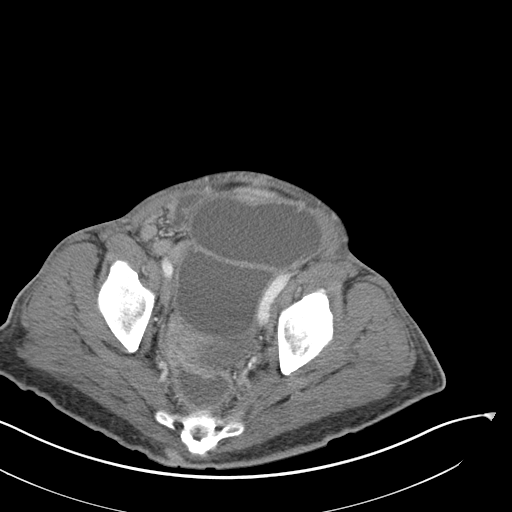
[im 44/109  soft-tissue]
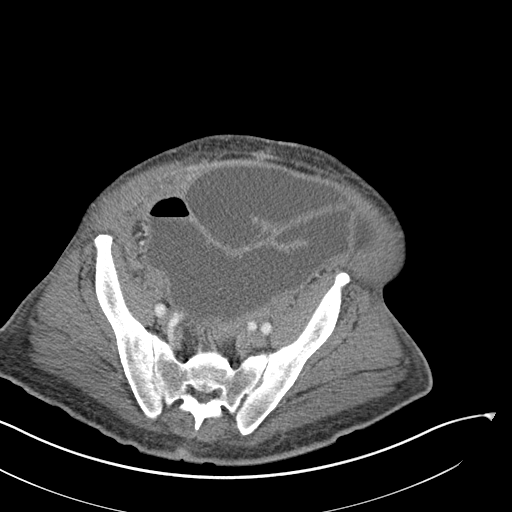
[im 58/109  soft-tissue]
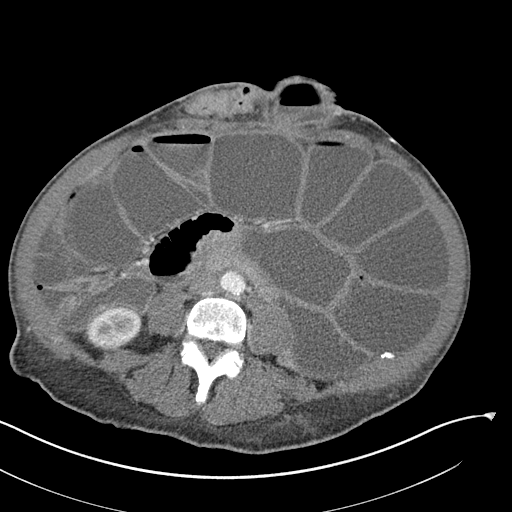
[im 65/109  soft-tissue]
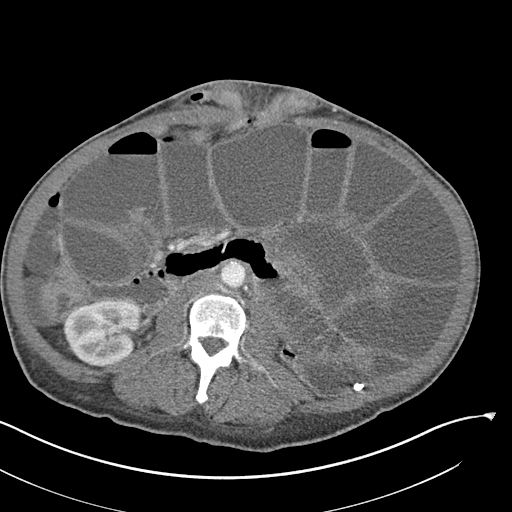
[im 73/109  soft-tissue]
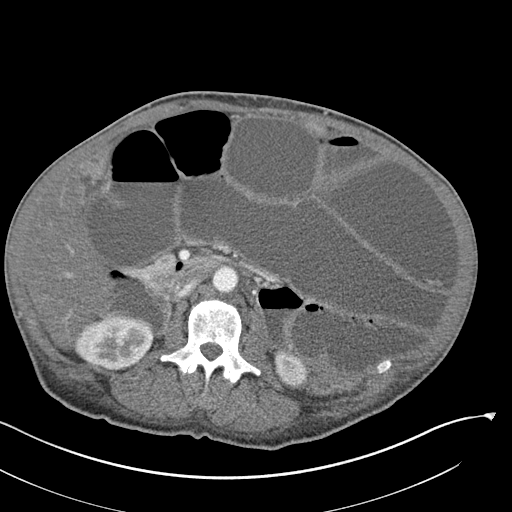
[im 73/109  bone]
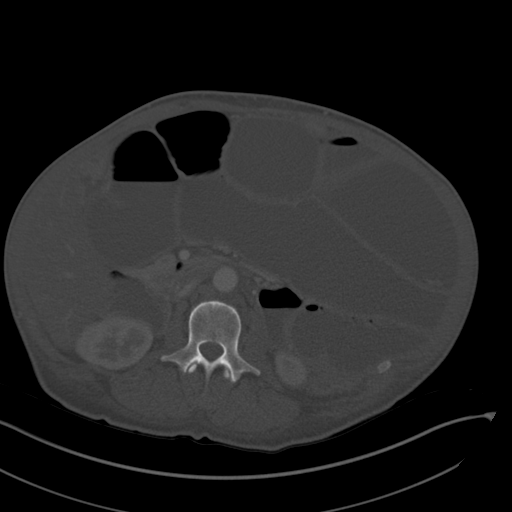
[im 80/109  soft-tissue]
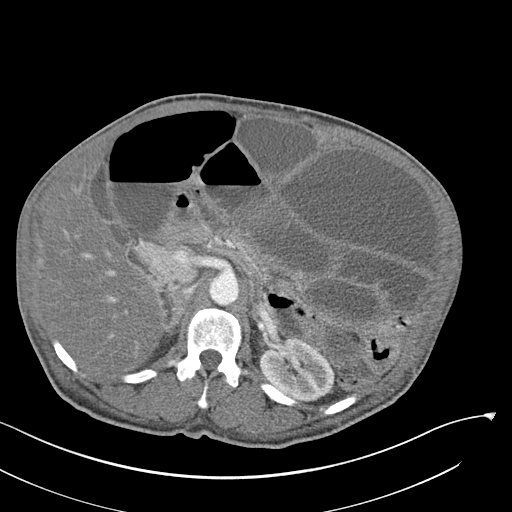
[im 87/109  soft-tissue]
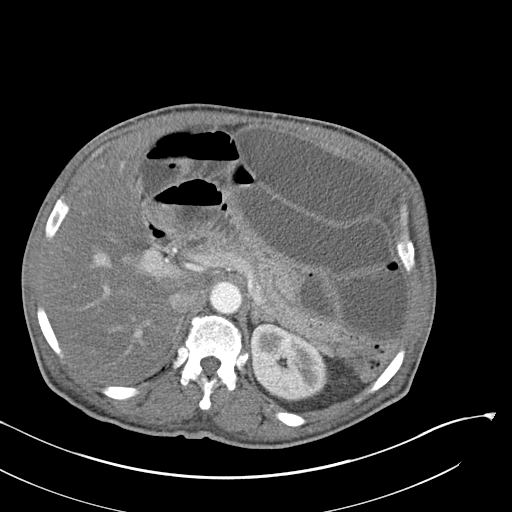
[im 94/109  soft-tissue]
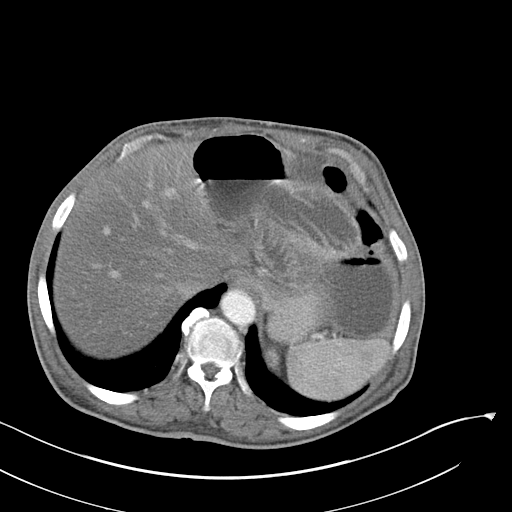
[im 101/109  soft-tissue]
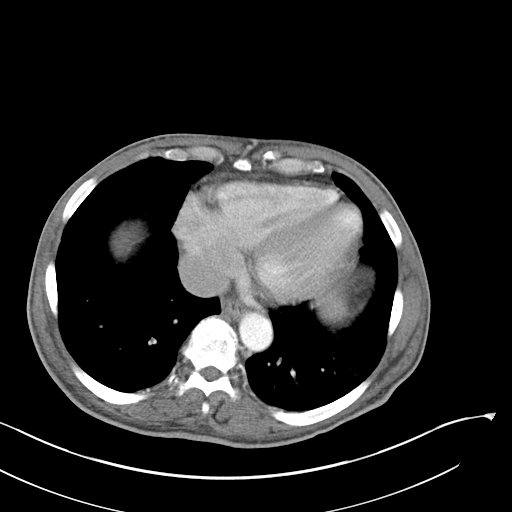

[Series 4: coronal st · coronal · 0.96mm/px · 3 of 179 slices shown]
[im 60/179  soft-tissue]
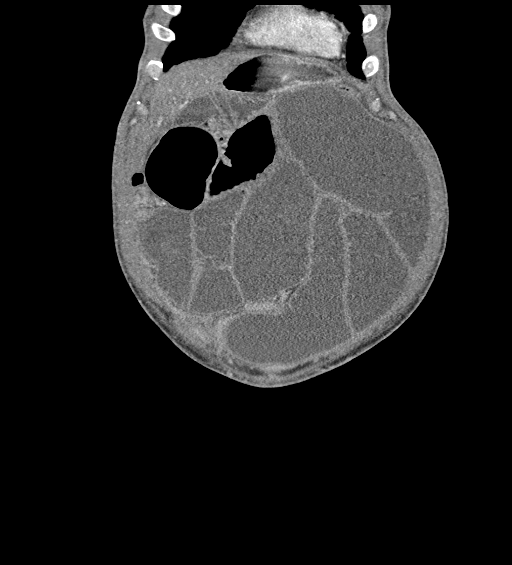
[im 80/179  soft-tissue]
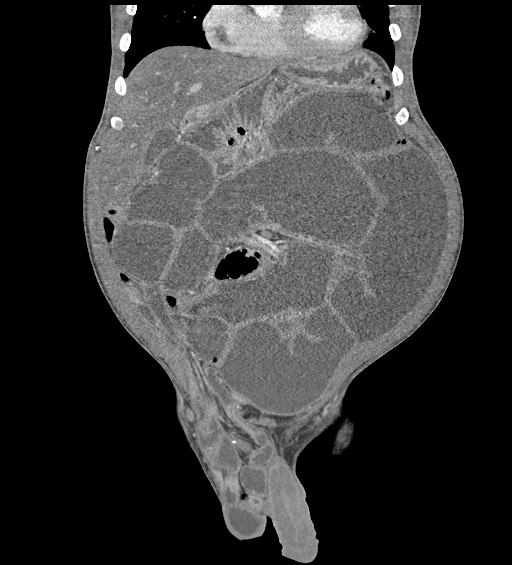
[im 99/179  soft-tissue]
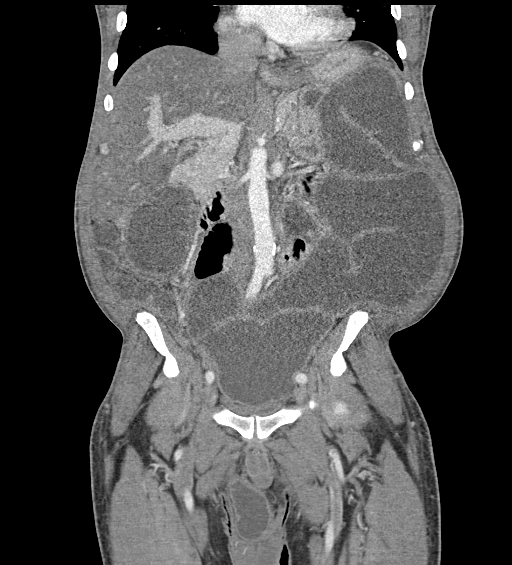

[16 of 46 positions shown; findings below may reference images not displayed]

FINDINGS: Lower chest: No acute abnormality.  Cardiomegaly.

Hepatobiliary: No solid liver abnormality is seen. Hepatic
steatosis. No gallstones, gallbladder wall thickening, or biliary
dilatation.

Pancreas: Unremarkable. No pancreatic ductal dilatation or
surrounding inflammatory changes.

Spleen: Normal in size without significant abnormality.

Adrenals/Urinary Tract: Adrenal glands are unremarkable. Kidneys are
normal, without renal calculi, solid lesion, or hydronephrosis.
Bladder is unremarkable.

Stomach/Bowel: Stomach is within normal limits. Similar appearance
of the small bowel, which is diffusely fluid filled and distended,
largest loops measuring up to 7.0 cm in the central abdomen.

Vascular/Lymphatic: Aortic atherosclerosis. No enlarged abdominal or
pelvic lymph nodes.

Reproductive: Prostatomegaly.

Other: There is a redemonstrated large umbilical hernia containing
an obstructed loop of mid to distal small bowel (series 2, image
57). There is a sharp transition point exiting this hernia and there
is scattered air and fluid present throughout the remaining distal
small bowel and colon. Additional epigastric hernia containing
decompressed transverse colon (series 2, image 47). Large right
inguinal hernia containing multiple loops of generally decompressed
distal small bowel, distal to the suspected obstruction point noted
above. No abdominopelvic ascites.

Musculoskeletal: No acute or significant osseous findings.
IMPRESSION: 1. Similar appearance of the small bowel, which is diffusely fluid
filled and distended, largest loops measuring up to 7.0 cm in the
central abdomen.
2. There is a redemonstrated large umbilical hernia containing an
obstructed loop of mid to distal small bowel. There is a sharp
transition point exiting this hernia and there is scattered air and
fluid present throughout the remaining distal small bowel and colon.
Findings are consistent with small bowel obstruction, possibly
partial and chronic given very similar appearance to prior
examination.
3. Large right inguinal hernia containing multiple loops of
generally decompressed distal small bowel, distal to the suspected
obstruction point noted above.
4. Additional epigastric hernia containing decompressed transverse
colon.
5. Prostatomegaly.
6. Hepatic steatosis.

Aortic Atherosclerosis ([8H]-[8H]).

## 2020-12-17 IMAGING — CR DG ABDOMEN ACUTE W/ 1V CHEST
4 series · 4 of 4 positions shown · non-contrast
Comparison: Previous studies including the CT done on [DATE]

CLINICAL DATA: Abdominal pain and distention

EXAM:
DG ABDOMEN ACUTE WITH 1 VIEW CHEST

[w chest pa]
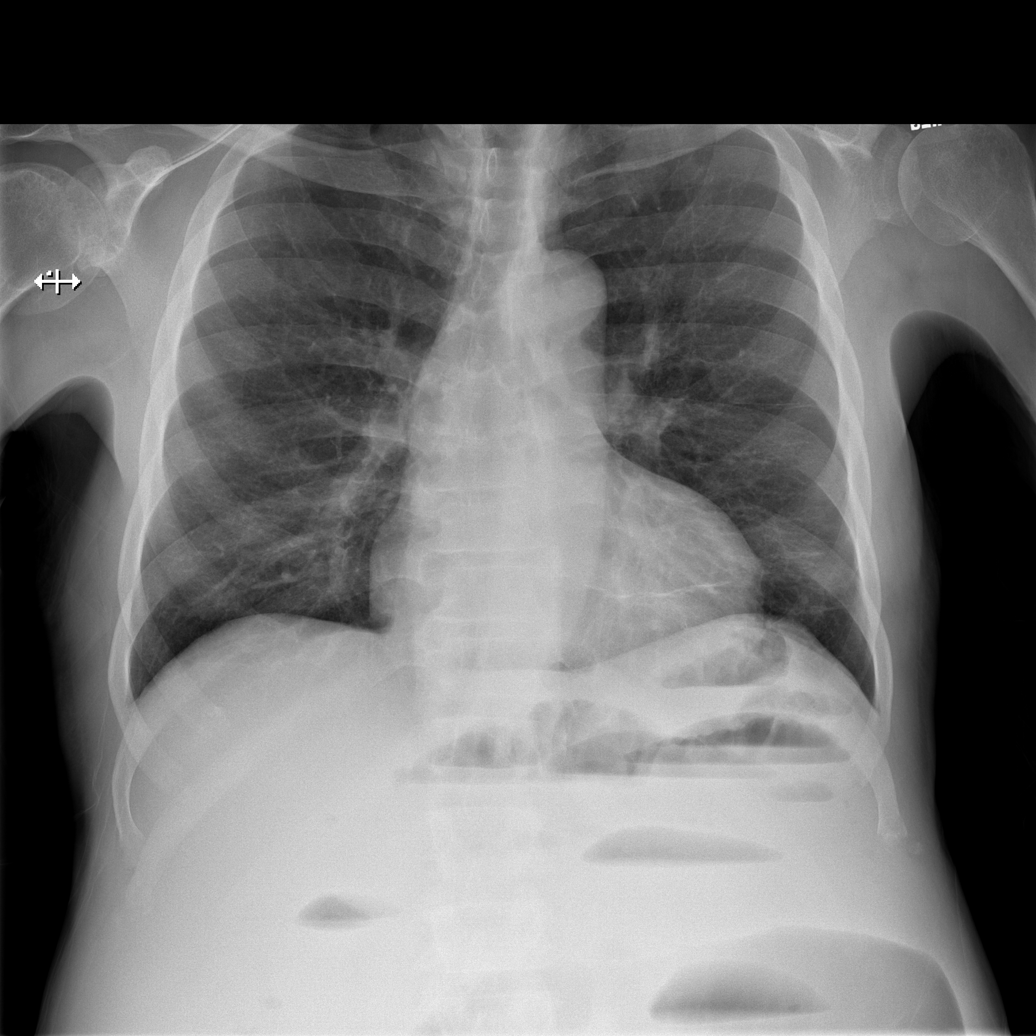

[w abdomen upright]
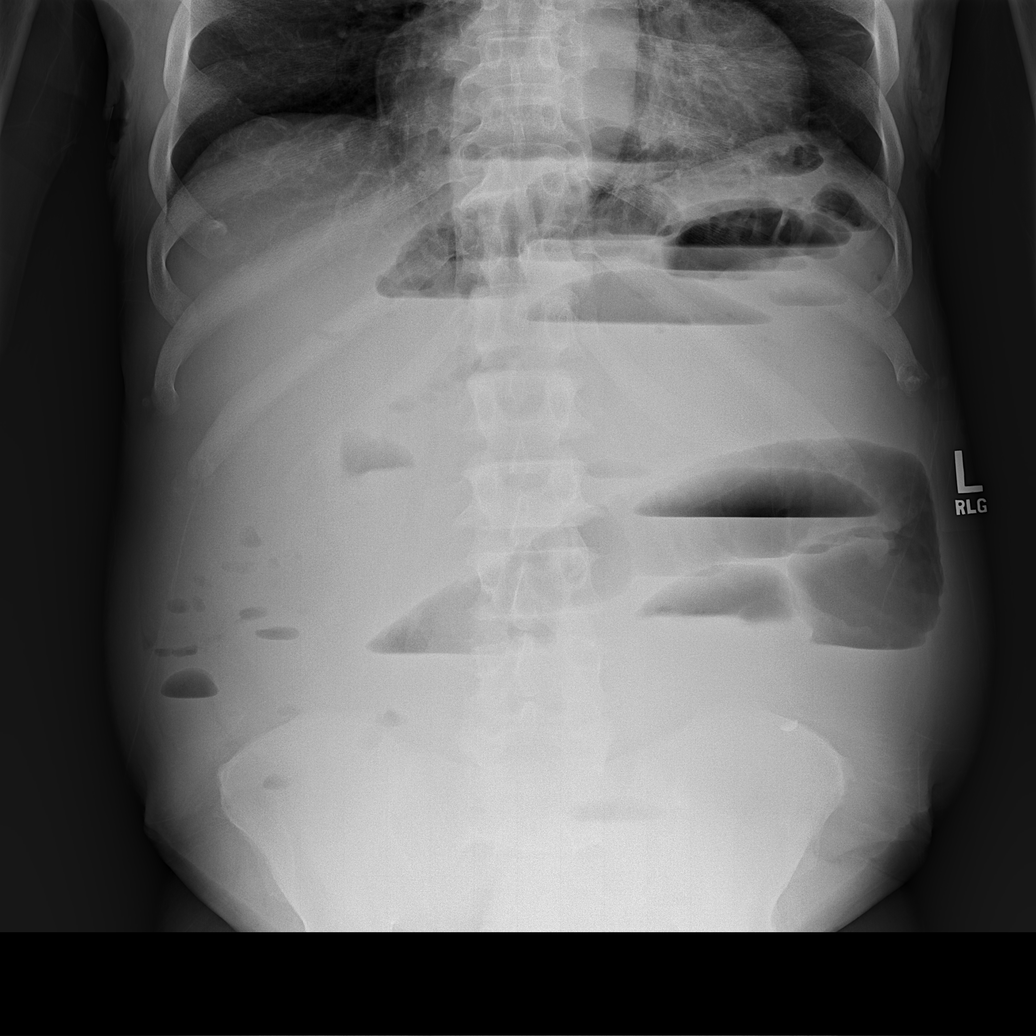

[t abdomen supine (1 of 2)]
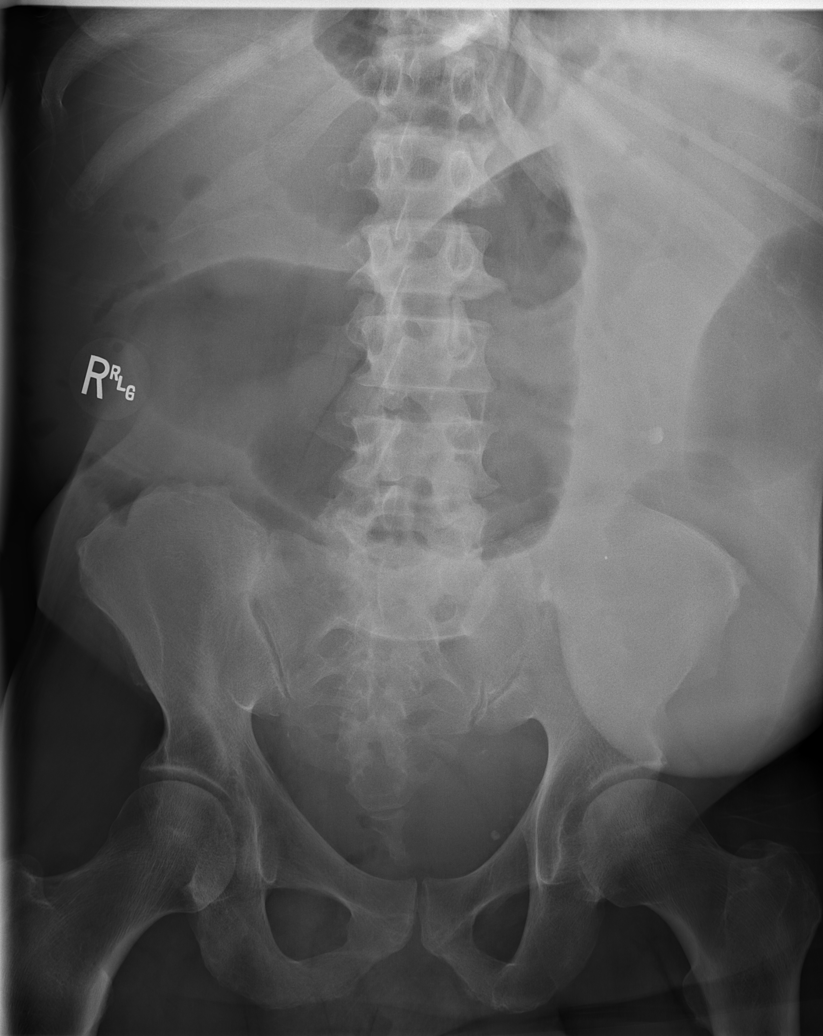

[t abdomen supine (2 of 2)]
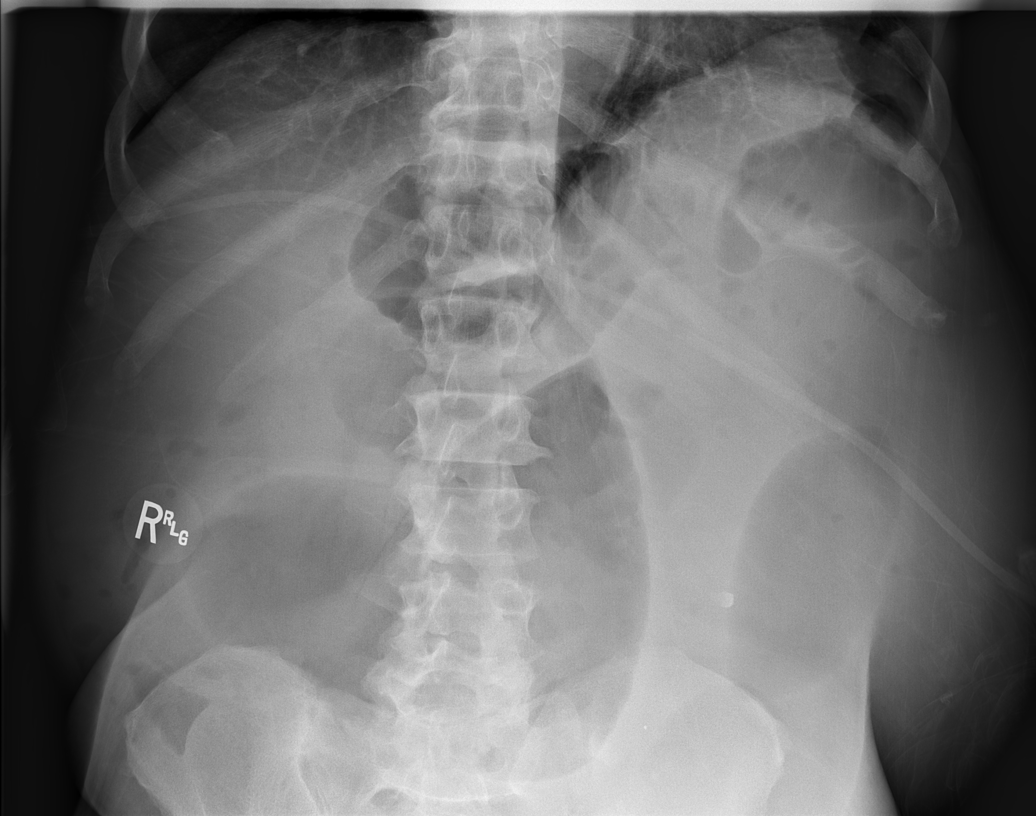

[4 of 4 positions shown; findings below may reference images not displayed]

FINDINGS: Transverse diameter of heart is slightly increased. There are no
signs of alveolar pulmonary edema or focal pulmonary consolidation.
Small transverse linear densities in the left lower lung fields may
suggest scarring or subsegmental atelectasis. There is no pleural
effusion or pneumothorax. There are multiple air-fluid levels in
dilated small bowel loops. There is no demonstrable
pneumoperitoneum. There is diffuse haziness in the abdomen and
pelvis suggesting possible presence of ascites or fluid-filled bowel
loops. There was no evidence of ascites in the immediate previous
CT.
IMPRESSION: There are air-fluid levels in dilated small bowel loops suggesting
high-grade obstruction. There is diffuse haziness in the abdomen and
pelvis which may suggest ascites or fluid-filled bowel loops.
Follow-up CT as clinically warranted should be considered.

There are no signs of pulmonary edema or focal pulmonary
consolidation. Small linear densities in the left lower lung fields
suggest possible scarring.

## 2020-12-17 MED ORDER — IOHEXOL 350 MG/ML SOLN
75.0000 mL | Freq: Once | INTRAVENOUS | Status: AC | PRN
  Administered 2020-12-17: 75 mL via INTRAVENOUS

## 2020-12-17 MED ORDER — SODIUM CHLORIDE 0.9 % IV BOLUS
1000.0000 mL | Freq: Once | INTRAVENOUS | Status: AC
  Administered 2020-12-17: 1000 mL via INTRAVENOUS

## 2020-12-17 MED ORDER — ONDANSETRON HCL 4 MG/2ML IJ SOLN
4.0000 mg | Freq: Once | INTRAMUSCULAR | Status: AC
  Administered 2020-12-17: 4 mg via INTRAVENOUS
  Filled 2020-12-17: qty 2

## 2020-12-17 NOTE — Consult Note (Signed)
[facility] Surgery Consult Note  Logan Nelson Sep 15, 1965  [id number].    Requesting MD: Dr. Davonna Belling Chief Complaint/Reason for Consult: SBO, Hernia  HPI: Logan Nelson is a 55 y.o. male with a hx of anemia secondary to B12 deficiency as well as known ventral incisional hernia and right inguinal hernia who presented to the ED for abdominal pain, nausea and vomiting.    Patient has a remote hx of colon resection and later colostomy takedown.  He since developed known ventral incisional hernias.  He was admitted at [facility] for SBO on 9/6 - 9/8 for SBO.  At that time the hernias were reducible and was felt that his SBO was likely secondary to adhesions.  His symptoms resolved and he had return of bowel function.  He was discharged home with recommendation to follow up with a surgeon in the office.   Today he presents with 2 days hx of crampy, generalized abdominal pain with associated nausea and multiple episodes of vomiting.  Reports no flatus or BM in the last 2 days.  He reports his hernias are reducible but more difficult since onset of his symptoms. Nothing seems to make this better or worse. This feels similar to his prior episodes of sbo. Since discharge from the hospital 2 months ago he has been taking antinausea medication with no issues, he has been having bowel function and tolerating a diet. He ran out of this medication 2 days ago and that is when his symptoms recurred.   In the ED patients vitals are stable without fever, tachycardia or hypotension.  WBC 6.8.  CT scan with diffuse fluid-filled, distended small bowel; large umbilical hernia containing an obstructed loop of mid to distal small bowel; a large right inguinal hernia containing multiple loops of generally decompressed distal small bowel; an epigastric hernia containing decompressed transverse colon.  We were asked to see.   Since arriving to the ED he reports that he has received nausea medication and his  n/v has resolved. He has had return of bowel function with flatus and 2 BM's. He is wishing to go home.   Abdominal surgical history: colon resection and later colostomy takedown Last colonoscopy None on file Anticoagulants: None Tbcc use: Everyday (2-3 cigarette's a day) Alc: None Employment: Unemployed   ROS: Review of Systems  Respiratory: Negative.    Cardiovascular: Negative.   Gastrointestinal:  Positive for abdominal pain, constipation, nausea and vomiting.   All systems reviewed and otherwise negative except for as above  No family history on file.  Past Medical History:  Diagnosis Date   Arthritis    Hernia of abdominal wall    Hypertension     Past Surgical History:  Procedure Laterality Date   ABDOMINAL SURGERY      Social History:  reports that he has quit smoking. His smoking use included cigarettes. He has never used smokeless tobacco. He reports that he does not currently use alcohol. He reports that he does not currently use drugs.  Allergies: No Known Allergies  (Not in a hospital admission)   Prior to Admission medications   Medication Sig Start Date End Date Taking? Authorizing Provider  amLODipine (NORVASC) 2.5 MG tablet Take 1 tablet (2.5 mg total) by mouth daily. 11/04/20 11/04/21  [name], MD  cyanocobalamin (,VITAMIN B-12,) 1000 MCG/ML injection Inject 1 mL Once a week for 4 weeks, then once a month thereafter. 11/04/20   [name], MD    Blood pressure (!) 135/91, pulse 63,  temperature 97.6 F (36.4 C), temperature source Oral, resp. rate 18, height 5' 6.5" (1.689 m), SpO2 97 %. Physical Exam: General: pleasant, WD/WN male who is laying in bed in NAD HEENT: head is normocephalic, atraumatic.  Sclera are noninjected.  Pupils equal and round.  Ears and nose without any masses or lesions.  Mouth is pink and moist. Poor dentition Heart: bradycardia.  Normal s1,s2. No obvious murmurs, gallops, or rubs noted.  Palpable pedal pulses  bilaterally  Lungs: CTAB, no wheezes, rhonchi, or rales noted.  Respiratory effort nonlabored Abd: soft, mild distension, abdomen completely nontender,  +BS, no masses or organomegaly. Multiple ventral hernias near umbilicus that are soft and fully reducible MS: no BUE/BLE edema, calves soft and nontender Skin: warm and dry with no masses, lesions, or rashes Psych: A&Ox4 with an appropriate affect Neuro: cranial nerves grossly intact, equal strength in BUE/BLE bilaterally, normal speech, thought process intact  Results for orders placed or performed during the hospital encounter of 12/17/20 (from the past 48 hour(s))  Lipase, blood     Status: None   Collection Time: 12/17/20  9:24 AM  Result Value Ref Range   Lipase 28 11 - 51 U/L    Comment: Performed at [facility], [place] [address], [place], [place] [address]  Comprehensive metabolic panel     Status: Abnormal   Collection Time: 12/17/20  9:24 AM  Result Value Ref Range   Sodium 138 135 - 145 mmol/L   Potassium 3.6 3.5 - 5.1 mmol/L   Chloride 99 98 - 111 mmol/L   CO2 30 22 - 32 mmol/L   Glucose, Bld 103 (H) 70 - 99 mg/dL    Comment: Glucose reference range applies only to samples taken after fasting for at least 8 hours.   BUN 21 (H) 6 - 20 mg/dL   Creatinine, Ser 1.05 0.61 - 1.24 mg/dL   Calcium 9.2 8.9 - 10.3 mg/dL   Total Protein 8.2 (H) 6.5 - 8.1 g/dL   Albumin 4.3 3.5 - 5.0 g/dL   AST 27 15 - 41 U/L   ALT 27 0 - 44 U/L   Alkaline Phosphatase 114 38 - 126 U/L   Total Bilirubin 0.5 0.3 - 1.2 mg/dL   GFR, Estimated >60 >60 mL/min    Comment: (NOTE) Calculated using the CKD-EPI Creatinine Equation (2021)    Anion gap 9 5 - 15    Comment: Performed at [facility], [place] [address] [place], [place] [address]  CBC     Status: None   Collection Time: 12/17/20  9:24 AM  Result Value Ref Range   WBC 6.8 4.0 - 10.5 K/uL   RBC 4.81 4.22 - 5.81 MIL/uL   Hemoglobin 14.1 13.0 - 17.0 g/dL    HCT 43.2 39.0 - 52.0 %   MCV 89.8 80.0 - 100.0 fL   MCH 29.3 26.0 - 34.0 pg   MCHC 32.6 30.0 - 36.0 g/dL   RDW 14.2 11.5 - 15.5 %   Platelets 310 150 - 400 K/uL   nRBC 0.0 0.0 - 0.2 %    Comment: Performed at [facility], [place] [address], [place],  [address]   CT ABDOMEN PELVIS W CONTRAST  Result Date: 12/17/2020 CLINICAL DATA:  Bowel obstruction suspected EXAM: CT ABDOMEN AND PELVIS WITH CONTRAST TECHNIQUE: Multidetector CT imaging of the abdomen and pelvis was performed using the standard protocol following bolus administration of intravenous contrast. CONTRAST:  38mL OMNIPAQUE IOHEXOL 350 MG/ML  SOLN COMPARISON:  10/25/2020 FINDINGS: Lower chest: No acute abnormality.  Cardiomegaly. Hepatobiliary: No solid liver abnormality is seen. Hepatic steatosis. No gallstones, gallbladder wall thickening, or biliary dilatation. Pancreas: Unremarkable. No pancreatic ductal dilatation or surrounding inflammatory changes. Spleen: Normal in size without significant abnormality. Adrenals/Urinary Tract: Adrenal glands are unremarkable. Kidneys are normal, without renal calculi, solid lesion, or hydronephrosis. Bladder is unremarkable. Stomach/Bowel: Stomach is within normal limits. Similar appearance of the small bowel, which is diffusely fluid filled and distended, largest loops measuring up to 7.0 cm in the central abdomen. Vascular/Lymphatic: Aortic atherosclerosis. No enlarged abdominal or pelvic lymph nodes. Reproductive: Prostatomegaly. Other: There is a redemonstrated large umbilical hernia containing an obstructed loop of mid to distal small bowel (series 2, image 57). There is a sharp transition point exiting this hernia and there is scattered air and fluid present throughout the remaining distal small bowel and colon. Additional epigastric hernia containing decompressed transverse colon (series 2, image 47). Large right inguinal hernia containing multiple loops of generally  decompressed distal small bowel, distal to the suspected obstruction point noted above. No abdominopelvic ascites. Musculoskeletal: No acute or significant osseous findings. IMPRESSION: 1. Similar appearance of the small bowel, which is diffusely fluid filled and distended, largest loops measuring up to 7.0 cm in the central abdomen. 2. There is a redemonstrated large umbilical hernia containing an obstructed loop of mid to distal small bowel. There is a sharp transition point exiting this hernia and there is scattered air and fluid present throughout the remaining distal small bowel and colon. Findings are consistent with small bowel obstruction, possibly partial and chronic given very similar appearance to prior examination. 3. Large right inguinal hernia containing multiple loops of generally decompressed distal small bowel, distal to the suspected obstruction point noted above. 4. Additional epigastric hernia containing decompressed transverse colon. 5. Prostatomegaly. 6. Hepatic steatosis. Aortic Atherosclerosis (ICD10-I70.0). Electronically Signed   By: Delanna Ahmadi M.D.   On: 12/17/2020 11:09   DG Abdomen Acute W/Chest  Result Date: 12/17/2020 CLINICAL DATA:  Abdominal pain and distention EXAM: DG ABDOMEN ACUTE WITH 1 VIEW CHEST COMPARISON:  Previous studies including the CT done on 2020/10/25 FINDINGS: Transverse diameter of heart is slightly increased. There are no signs of alveolar pulmonary edema or focal pulmonary consolidation. Small transverse linear densities in the left lower lung fields may suggest scarring or subsegmental atelectasis. There is no pleural effusion or pneumothorax. There are multiple air-fluid levels in dilated small bowel loops. There is no demonstrable pneumoperitoneum. There is diffuse haziness in the abdomen and pelvis suggesting possible presence of ascites or fluid-filled bowel loops. There was no evidence of ascites in the immediate previous CT. IMPRESSION: There are  air-fluid levels in dilated small bowel loops suggesting high-grade obstruction. There is diffuse haziness in the abdomen and pelvis which may suggest ascites or fluid-filled bowel loops. Follow-up CT as clinically warranted should be considered. There are no signs of pulmonary edema or focal pulmonary consolidation. Small linear densities in the left lower lung fields suggest possible scarring. Electronically Signed   By: Elmer Picker M.D.   On: 12/17/2020 10:08      Assessment/Plan Chronic partial SBO Ventral incisional hernias Right inguinal hernia - CT scan concerning with dilated small bowel, large umbilical hernia containing small bowel with transition point exiting the hernia. Patient's hernias are soft and completely reducible, suspect SBO is due to adhesive disease. Since arrival in the ED his symptoms have resolved. He is passing flatus and has had 2  bowel movements. He is adamant that he would like to go home. Would recommend admission for observation but patient declines. Will PO challenge in the ED. Patient was supposed to follow up in our office to discuss surgical repair, but because he did not have insurance he could not afford the appointment. He is currently working on applying for [employer]. We will ask TOC team to consult for any assistance they may provide in his insurance application process. He is working on smoking cessation and is down to 2-3 cigarettes/day.   Vitamin B12 deficiency Anemia Tobacco abuse Bradycardia - sees cardiology 12/22/20   Franne Forts, [facility] Surgery 12/17/2020, 11:44 AM Please see Amion for pager number during day hours 7:00am-4:30pm

## 2020-12-17 NOTE — Discharge Instructions (Signed)
Follow-up with the surgeons.  Return for worsening abdominal pain or more vomiting.

## 2020-12-17 NOTE — ED Triage Notes (Signed)
Pt presents d/t abdominal pain.  Large known mass present on abdomen.  Pt states was taken off of all medications until he goes to see cardiologist.  Pt states he has poor po intake and N/V.

## 2020-12-17 NOTE — ED Notes (Signed)
Pt stated he needed to leave to go pick up his daughter by 1330. Upon entering patients room, Patient had already disconnected himself from the monitor and pulled out his IV. Rubin Payor, MD notified.

## 2020-12-17 NOTE — ED Provider Notes (Signed)
[place] DEPT Provider Note   CSN: [id number] Arrival date & time: 12/17/20  [id number]     History Chief Complaint  Patient presents with   Abdominal Pain    Logan Nelson is a 55 y.o. male.   Abdominal Pain Associated symptoms: constipation, nausea and vomiting   Associated symptoms: no chest pain, no fever and no shortness of breath   Patient presents with nausea and vomiting.  Known multiple abdominal hernias and right inguinal hernia.  States for last 2 days has had more abdominal pain.  Crampy over the abdomen.  States he has been having nausea and vomiting.  States he is not having a bowel movement and states he has not passed gas in 2 days either.  States that hernias are more difficult to reduce.  Not really hurting that much different than normal.  No fevers.  States he went to see the surgeon after previous bowel obstruction they said until he got a job they could not fix him.    Past Medical History:  Diagnosis Date   Arthritis    Hernia of abdominal wall    Hypertension     Patient Active Problem List   Diagnosis Date Noted   Small bowel obstruction ([place]) 10/13/2020   Anemia due to vitamin B12 deficiency 10/13/2020   Hypertension 01/09/2015   Incisional hernia 01/07/2015    Past Surgical History:  Procedure Laterality Date   ABDOMINAL SURGERY         No family history on file.  Social History   Tobacco Use   Smoking status: Former    Types: Cigarettes   Smokeless tobacco: Never  Vaping Use   Vaping Use: Never used  Substance Use Topics   Alcohol use: Not Currently   Drug use: Not Currently    Home Medications Prior to Admission medications   Medication Sig Start Date End Date Taking? Authorizing Provider  amLODipine (NORVASC) 2.5 MG tablet Take 1 tablet (2.5 mg total) by mouth daily. 11/04/20 11/04/21  [name], MD  cyanocobalamin (,VITAMIN B-12,) 1000 MCG/ML injection Inject 1 mL Once a week for 4  weeks, then once a month thereafter. 11/04/20   [name], MD    Allergies    Patient has no known allergies.  Review of Systems   Review of Systems  Constitutional:  Positive for appetite change. Negative for fever.  HENT:  Negative for congestion.   Respiratory:  Negative for shortness of breath.   Cardiovascular:  Negative for chest pain.  Gastrointestinal:  Positive for abdominal pain, constipation, nausea and vomiting.  Genitourinary:  Negative for flank pain.  Musculoskeletal:  Negative for back pain.  Skin:  Negative for rash.  Psychiatric/Behavioral:  Negative for confusion.    Physical Exam Updated Vital Signs BP 117/77   Pulse (!) 55   Temp 97.6 F (36.4 C) (Oral)   Resp 16   Ht 5' 6.5" (1.689 m)   SpO2 99%   BMI 25.69 kg/m   Physical Exam Vitals and nursing note reviewed.  HENT:     Head: Normocephalic.  Cardiovascular:     Rate and Rhythm: Regular rhythm.  Pulmonary:     Breath sounds: Normal breath sounds.  Abdominal:     General: Abdomen is protuberant. There is distension.     Tenderness: There is no abdominal tenderness.     Hernia: A hernia is present.     Comments: Multiple ventral hernias.  Infraumbilical large chronic hernia.  Has abrasion  on the skin.  Does reduce.  Also more hernia superior and superior right lateral to the umbilicus.  Also reducible but with more difficulty.  Also right inguinal hernia that also reduces but with some difficulty.  Abdomen mildly distended.  Skin:    General: Skin is warm.     Capillary Refill: Capillary refill takes less than 2 seconds.  Neurological:     Mental Status: He is alert and oriented to person, place, and time.    ED Results / Procedures / Treatments   Labs (all labs ordered are listed, but only abnormal results are displayed) Labs Reviewed  COMPREHENSIVE METABOLIC PANEL - Abnormal; Notable for the following components:      Result Value   Glucose, Bld 103 (*)    BUN 21 (*)    Total  Protein 8.2 (*)    All other components within normal limits  LIPASE, BLOOD  CBC  URINALYSIS, ROUTINE W REFLEX MICROSCOPIC    EKG None  Radiology CT ABDOMEN PELVIS W CONTRAST  Result Date: 12/17/2020 CLINICAL DATA:  Bowel obstruction suspected EXAM: CT ABDOMEN AND PELVIS WITH CONTRAST TECHNIQUE: Multidetector CT imaging of the abdomen and pelvis was performed using the standard protocol following bolus administration of intravenous contrast. CONTRAST:  30mL OMNIPAQUE IOHEXOL 350 MG/ML SOLN COMPARISON:  Oct 26, 2020 FINDINGS: Lower chest: No acute abnormality.  Cardiomegaly. Hepatobiliary: No solid liver abnormality is seen. Hepatic steatosis. No gallstones, gallbladder wall thickening, or biliary dilatation. Pancreas: Unremarkable. No pancreatic ductal dilatation or surrounding inflammatory changes. Spleen: Normal in size without significant abnormality. Adrenals/Urinary Tract: Adrenal glands are unremarkable. Kidneys are normal, without renal calculi, solid lesion, or hydronephrosis. Bladder is unremarkable. Stomach/Bowel: Stomach is within normal limits. Similar appearance of the small bowel, which is diffusely fluid filled and distended, largest loops measuring up to 7.0 cm in the central abdomen. Vascular/Lymphatic: Aortic atherosclerosis. No enlarged abdominal or pelvic lymph nodes. Reproductive: Prostatomegaly. Other: There is a redemonstrated large umbilical hernia containing an obstructed loop of mid to distal small bowel (series 2, image 57). There is a sharp transition point exiting this hernia and there is scattered air and fluid present throughout the remaining distal small bowel and colon. Additional epigastric hernia containing decompressed transverse colon (series 2, image 47). Large right inguinal hernia containing multiple loops of generally decompressed distal small bowel, distal to the suspected obstruction point noted above. No abdominopelvic ascites. Musculoskeletal: No acute or  significant osseous findings. IMPRESSION: 1. Similar appearance of the small bowel, which is diffusely fluid filled and distended, largest loops measuring up to 7.0 cm in the central abdomen. 2. There is a redemonstrated large umbilical hernia containing an obstructed loop of mid to distal small bowel. There is a sharp transition point exiting this hernia and there is scattered air and fluid present throughout the remaining distal small bowel and colon. Findings are consistent with small bowel obstruction, possibly partial and chronic given very similar appearance to prior examination. 3. Large right inguinal hernia containing multiple loops of generally decompressed distal small bowel, distal to the suspected obstruction point noted above. 4. Additional epigastric hernia containing decompressed transverse colon. 5. Prostatomegaly. 6. Hepatic steatosis. Aortic Atherosclerosis (ICD10-I70.0). Electronically Signed   By: Jearld Lesch M.D.   On: 12/17/2020 11:09   DG Abdomen Acute W/Chest  Result Date: 12/17/2020 CLINICAL DATA:  Abdominal pain and distention EXAM: DG ABDOMEN ACUTE WITH 1 VIEW CHEST COMPARISON:  Previous studies including the CT done on 2020/10/26 FINDINGS: Transverse diameter of heart is slightly increased.  There are no signs of alveolar pulmonary edema or focal pulmonary consolidation. Small transverse linear densities in the left lower lung fields may suggest scarring or subsegmental atelectasis. There is no pleural effusion or pneumothorax. There are multiple air-fluid levels in dilated small bowel loops. There is no demonstrable pneumoperitoneum. There is diffuse haziness in the abdomen and pelvis suggesting possible presence of ascites or fluid-filled bowel loops. There was no evidence of ascites in the immediate previous CT. IMPRESSION: There are air-fluid levels in dilated small bowel loops suggesting high-grade obstruction. There is diffuse haziness in the abdomen and pelvis which may  suggest ascites or fluid-filled bowel loops. Follow-up CT as clinically warranted should be considered. There are no signs of pulmonary edema or focal pulmonary consolidation. Small linear densities in the left lower lung fields suggest possible scarring. Electronically Signed   By: Elmer Picker M.D.   On: 12/17/2020 10:08    Procedures Procedures   Medications Ordered in ED Medications  sodium chloride 0.9 % bolus 1,000 mL (0 mLs Intravenous Stopped 12/17/20 1058)  ondansetron (ZOFRAN) injection 4 mg (4 mg Intravenous Given 12/17/20 0934)  iohexol (OMNIPAQUE) 350 MG/ML injection 75 mL (75 mLs Intravenous Contrast Given 12/17/20 1042)    ED Course  I have reviewed the triage vital signs and the nursing notes.  Pertinent labs & imaging results that were available during my care of the patient were reviewed by me and considered in my medical decision making (see chart for details).    MDM Rules/Calculators/A&P                           Patient with abdominal pain vomiting.  Decreased colonic output.  Found to have likely bowel obstruction.  Has persistent hernias.  Although they do reduce.  Feeling better and has had bowel movements after the reduction been done but they have recurred.  Has been seen by general surgery.  Plan was for an oral trial recheck here but patient will not wait.  States he has to go pick up his son.  Will discharge home.  Follow-up with surgery has been arranged. Final Clinical Impression(s) / ED Diagnoses Final diagnoses:  Ventral hernia with obstruction and without gangrene    Rx / DC Orders ED Discharge Orders     None        Davonna Belling, MD 12/17/20 1251

## 2020-12-17 NOTE — ED Notes (Signed)
Patient refused to give urine sample

## 2020-12-17 NOTE — ED Notes (Signed)
Patient signed AMA form and received his discharge papers as he was leaving.

## 2020-12-21 NOTE — Progress Notes (Signed)
Cardiology Office Note:    Date:  12/22/2020   ID:  Sherrill Raring, DOB [birth year], MRN [id number]  PCP:  [name], MD  Cardiologist:  None  Electrophysiologist:  None   Referring MD: [name], MD   Chief Complaint/Reason for Referral: Bradycardia  History of Present Illness:    Logan Nelson is a 55 y.o. male with the indicated history here for recommendations regarding bradycardia. The patient saw his PCP recently prior to an ED presentation.  At that appointment, his HR was noted to be in the high 50s.  Patient then presented to the emergency department with abdominal pain.  He has a history of abdominal hernias. He was evaluated by Dr. Almon Register of general surgery and ultimately discharged with plans for outpatient surgery follow-up.  In terms of symptoms the patient tells me that when his hernia hurts him he will get lower chest pain.  He is not able to specify the character of his pain after questioning.  He also tells me he gets short of breath but it is unclear whether this occurs due to abdominal pain or separately.  He tells me that he has been having abdominal pain chest pain and shortness of breath every day for a long long time.  It does not seem to be associated with exertion or worse with exertion but again he is unable to really specify this for me.  He denies any severe bleeding or bruising, presyncope, syncope, palpitations, or paroxysmal nocturnal dyspnea.  He has had no edema.    Past Medical History:  Diagnosis Date   Arthritis    Hernia of abdominal wall    Hypertension     Past Surgical History:  Procedure Laterality Date   ABDOMINAL SURGERY      Current Medications: No outpatient medications have been marked as taking for the 12/22/20 encounter (Office Visit) with Orbie Pyo, MD.     Allergies:   No Known Allergies  Social History   Tobacco Use   Smoking status: Former    Types: Cigarettes   Smokeless tobacco: Never   Vaping Use   Vaping Use: Never used  Substance Use Topics   Alcohol use: Not Currently   Drug use: Not Currently     Family History: History reviewed. No pertinent family history.   ROS:   Please see the history of present illness.    All other systems reviewed and are negative.  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  Imaging studies that I have independently reviewed today: CT abdomen  Recent Labs: 12/17/2020: ALT 27; BUN 21; Creatinine, Ser 1.05; Hemoglobin 14.1; Platelets 310; Potassium 3.6; Sodium 138  Recent Lipid Panel No results found for: CHOL, TRIG, HDL, CHOLHDL, VLDL, LDLCALC, LDLDIRECT  Physical Exam:    VS:  BP 100/70   Pulse 79   Ht 5' 6.5" (1.689 m)   Wt 164 lb 6.4 oz (74.6 kg)   SpO2 99%   BMI 26.14 kg/m     Wt Readings from Last 5 Encounters:  12/22/20 164 lb 6.4 oz (74.6 kg)  12/08/20 161 lb 9.6 oz (73.3 kg)  11/04/20 171 lb 3.2 oz (77.7 kg)  10/12/20 202 lb 13.2 oz (92 kg)  07/10/19 201 lb 8 oz (91.4 kg)    GENERAL:  No apparent distress, AOx3 HEENT:  No carotid bruits, +2  carotid impulses, no scleral icterus CAR: RRR no murmurs, gallops, rubs, or thrills RES:  Clear to auscultation bilaterally ABD:  Soft, large ventral hernia, +BS VASC:  +2 radial pulses, +2 carotid pulses, palpable pedal pulses NEURO:  CN 2-12 grossly intact; motor and sensory grossly intact PSYCH:  No active depression or anxiety EXT:  No edema, ecchymosis, or cyanosis  ASSESSMENT:    1. Bradycardia, sinus   2. Precordial pain   3. Dyspnea, unspecified type    PLAN:    Bradycardia, sinus EKG demonstrates normal sinus rhythm with left anterior fascicular block.  He is having no signs or symptoms related to bradycardia arrhythmias.  No further evaluation is necessary other than monitoring.  Precordial pain Will  obtain dobutamine stress echocardiogram to get baseline LV function and assess for ischemia.  Dyspnea, unspecified type Will obtain dobutamine stress echocardiogram with baseline imaging to evaluate LV function.  Otherwise, will keep follow up in 6 months.   Shared Decision Making/Informed Consent:   Shared Decision Making/Informed Consent The risks [chest pain, shortness of breath, cardiac arrhythmias, dizziness, blood pressure fluctuations, myocardial infarction, stroke/transient ischemic attack, and life-threatening complications (estimated to be 1 in 10,000)], benefits (risk stratification, diagnosing coronary artery disease, treatment guidance) and alternatives of a stress or dobutamine stress echocardiogram were discussed in detail with Logan Nelson and he agrees to proceed.   Medication Adjustments/Labs and Tests Ordered: Current medicines are reviewed at length with the patient today.  Concerns regarding medicines are outlined above.   Orders Placed This Encounter  Procedures   EKG 12-Lead   ECHOCARDIOGRAM STRESS TEST     No orders of the defined types were placed in this encounter.   Patient Instructions  Medication Instructions:  No changes  Lab Work: none  Testing/Procedures: Your physician has requested that you have a dobutamine echocardiogram. For further information please visit [email or web address] Please follow instruction sheet as given.   Follow-Up: At [facility], you and your health needs are our priority.  As part of our continuing mission to provide you with exceptional heart care, we have created designated Provider Care Teams.  These Care Teams include your primary Cardiologist (physician) and Advanced Practice Providers (APPs -  Physician Assistants and Nurse Practitioners) who all work together to provide you with the care you need, when you need it.  We recommend signing up for the patient portal called "MyChart".  Sign up information is provided on  this After Visit Summary.  MyChart is used to connect with patients for Virtual Visits (Telemedicine).  Patients are able to view lab/test results, encounter notes, upcoming appointments, etc.  Non-urgent messages can be sent to your provider as well.   To learn more about what you can do with MyChart, go to [email or web address].    Your next appointment:   6 month(s)  The format for your next appointment:   In Person  Provider:   Alverda Skeans, MD   Other Instructions

## 2020-12-22 ENCOUNTER — Encounter: Payer: Self-pay | Admitting: Internal Medicine

## 2020-12-22 ENCOUNTER — Other Ambulatory Visit: Payer: Self-pay

## 2020-12-22 ENCOUNTER — Ambulatory Visit (INDEPENDENT_AMBULATORY_CARE_PROVIDER_SITE_OTHER): Payer: Self-pay | Admitting: Internal Medicine

## 2020-12-22 VITALS — BP 100/70 | HR 79 | Ht 66.5 in | Wt 164.4 lb

## 2020-12-22 DIAGNOSIS — R072 Precordial pain: Secondary | ICD-10-CM

## 2020-12-22 DIAGNOSIS — R0600 Dyspnea, unspecified: Secondary | ICD-10-CM

## 2020-12-22 DIAGNOSIS — R001 Bradycardia, unspecified: Secondary | ICD-10-CM

## 2020-12-22 NOTE — Patient Instructions (Addendum)
Medication Instructions:  No changes  Lab Work: none  Testing/Procedures: Your physician has requested that you have a dobutamine echocardiogram. For further information please visit [email or web address] Please follow instruction sheet as given.   Follow-Up: At [facility], you and your health needs are our priority.  As part of our continuing mission to provide you with exceptional heart care, we have created designated Provider Care Teams.  These Care Teams include your primary Cardiologist (physician) and Advanced Practice Providers (APPs -  Physician Assistants and Nurse Practitioners) who all work together to provide you with the care you need, when you need it.  We recommend signing up for the patient portal called "MyChart".  Sign up information is provided on this After Visit Summary.  MyChart is used to connect with patients for Virtual Visits (Telemedicine).  Patients are able to view lab/test results, encounter notes, upcoming appointments, etc.  Non-urgent messages can be sent to your provider as well.   To learn more about what you can do with MyChart, go to [email or web address].    Your next appointment:   6 month(s)  The format for your next appointment:   In Person  Provider:   Alverda Skeans, MD   12/22/20 12:10 pm: Addendum: call placed to Dr. Gordy Savers office [facility]) to adv patient is needing follow up for his abdominal hernia.  The patient is already scheduled at their office 12/30/20 at 10:20 am.  I called the patient and him know this.  The pt also was asking for medication for nausea and vomiting r/t his hernia.  I adv him that per Dr. Lynnette Caffey he should contact is PCP if he is need of this before going to [facility] appointment.  MWilson, Charity fundraiser.

## 2020-12-30 ENCOUNTER — Encounter (HOSPITAL_COMMUNITY): Payer: Self-pay | Admitting: Surgery

## 2020-12-30 ENCOUNTER — Ambulatory Visit: Payer: Self-pay | Admitting: Surgery

## 2020-12-30 ENCOUNTER — Encounter (HOSPITAL_COMMUNITY): Payer: Self-pay | Admitting: Vascular Surgery

## 2020-12-30 ENCOUNTER — Other Ambulatory Visit: Payer: Self-pay

## 2020-12-30 NOTE — Progress Notes (Signed)
Anesthesia Chart Review: Logan Nelson  Case: [id number] Date/Time: 01/01/21 1045   Procedures:      OPEN INCISIONAL HERNIA REPAIR     RIGHT INGUINAL HERNIA REPAIR WITH MESH (Right)   Anesthesia type: General   Pre-op diagnosis: INCISIONAL HERNIA AND RIGHT INGUINAL HERNIA   Location: [place] OR ROOM 01 / [place] OR   Surgeons: Stechschulte, Nickola Major, MD       DISCUSSION: Patient is a 55 year old male scheduled for the above procedure.   History includes former smoker (quit 04/28/20), HTN, abdominal surgery ("colectomy for likely diverticulitis with subsequent takedown", hernias (incision, right inguinal).  - Admission 10/12/20-10/15/20 for SBO related to infraumbilical hernia and with HGB of 6.4. S/p 2 units PRBC and started on Vitamin B12 for macrocytic anemia due to severe B12 deficiency. SBO improved with supportive care. No indication for emergent repair at that time. Out-patient general surgery follow-up planned.  - Established primary care with [name], MD on 11/04/20. HTN stable on amlodipine. Continue B12 for anemia with B12 deficiency. Referred to cardiology for bradycardia.  - He had recent cardiology evaluation by Dr. Ali Lowe on 12/22/20 for bradycardia, but also reported dyspnea and precordial pain. Dr. Ali Lowe also noted recent ED evaluation 12/18/19 for abdominal pain with hernia and pending general surgery out-patient follow-up. Given symptoms, he ordered a stress echo which is scheduled for 01/21/21. EKG at the office showed NSR, LAFB. He had no symptoms related to bradycardia, so continued monitoring recommended.   - He had out-patient general surgery follow-up with Dr. Thermon Leyland on 12/30/20. Note is not yet viewable in [facility], but above surgery posted. Patient had an ED visit 12/17/20 for abdominal pain with vomiting, Persistent hernia, but able to reduce. Felt better after BM.   Discussed case with anesthesiologist Suella Broad, MD. If case is not considered more  emergent, then would recommend postponing surgery until he has completed his cardiac evaluation/work-up. I have communicated this to Dr. Thermon Leyland, and he plans to reschedule surgery. He communicated that his staff will contact the patient and OR scheduling.   VS:  BP Readings from Last 3 Encounters:  12/22/20 100/70  12/17/20 117/77  12/08/20 115/80   Pulse Readings from Last 3 Encounters:  12/22/20 79  12/17/20 (!) 55  12/08/20 (!) 56     PROVIDERS: [name], MD is PCP  Lenna Sciara, MD is cardiologist   LABS: Currently, most recent lab results include: Lab Results  Component Value Date   WBC 6.8 12/17/2020   HGB 14.1 12/17/2020   HCT 43.2 12/17/2020   PLT 310 12/17/2020   GLUCOSE 103 (H) 12/17/2020   ALT 27 12/17/2020   AST 27 12/17/2020   NA 138 12/17/2020   K 3.6 12/17/2020   CL 99 12/17/2020   CREATININE 1.05 12/17/2020   BUN 21 (H) 12/17/2020   CO2 30 12/17/2020     IMAGES: CT Abd/pelvis 12/17/20: IMPRESSION: 1. Similar appearance of the small bowel, which is diffusely fluid filled and distended, largest loops measuring up to 7.0 cm in the central abdomen. 2. There is a redemonstrated large umbilical hernia containing an obstructed loop of mid to distal small bowel. There is a sharp transition point exiting this hernia and there is scattered air and fluid present throughout the remaining distal small bowel and colon. Findings are consistent with small bowel obstruction, possibly partial and chronic given very similar appearance to prior examination. 3. Large right inguinal hernia containing multiple loops of generally decompressed distal small bowel, distal  to the suspected obstruction point noted above. 4. Additional epigastric hernia containing decompressed transverse colon. 5. Prostatomegaly. 6. Hepatic steatosis. - Aortic Atherosclerosis (ICD10-I70.0).  Xray Abd acute with 1V CXR 12/17/20: IMPRESSION: - There are air-fluid levels in  dilated small bowel loops suggesting high-grade obstruction. There is diffuse haziness in the abdomen and pelvis which may suggest ascites or fluid-filled bowel loops. Follow-up CT as clinically warranted should be considered. - There are no signs of pulmonary edema or focal pulmonary consolidation. Small linear densities in the left lower lung fields suggest possible scarring.    EKG: 12/22/20: NSR. LAFB.   CV: Pending stress echo scheduled for 01/21/21.   Past Medical History:  Diagnosis Date   Arthritis    Hernia of abdominal wall    Hypertension     Past Surgical History:  Procedure Laterality Date   ABDOMINAL SURGERY      MEDICATIONS: No current facility-administered medications for this encounter.    amLODipine (NORVASC) 2.5 MG tablet   cyanocobalamin (,VITAMIN B-12,) 1000 MCG/ML injection    Shonna Chock, PA-C Surgical Short Stay/Anesthesiology [facility] Phone [phone] [facility] Phone [phone] 12/30/2020 3:55 PM

## 2020-12-30 NOTE — Progress Notes (Signed)
PCP - Dr. [name] Cardiologist - Alverda Skeans, MD  PPM/ICD - denies   Chest x-ray - 10/13/20 EKG - 12/22/20 Stress Test - denies ECHO - denies Cardiac Cath - denies  Sleep Study - denies   DM- denies  Blood Thinner Instructions: n/a Aspirin Instructions: n/a  ERAS Protcol - yes, no drink   COVID TEST- needs test DOS   Anesthesia review: yes, clearance?     All instructions explained to the patient, with a verbal understanding of the material. The opportunity to ask questions was provided.

## 2021-01-01 ENCOUNTER — Inpatient Hospital Stay (HOSPITAL_COMMUNITY): Admission: RE | Admit: 2021-01-01 | Payer: Self-pay | Source: Home / Self Care | Admitting: Surgery

## 2021-01-01 SURGERY — REPAIR, HERNIA, INCISIONAL
Anesthesia: General | Laterality: Right

## 2021-01-13 ENCOUNTER — Ambulatory Visit (INDEPENDENT_AMBULATORY_CARE_PROVIDER_SITE_OTHER): Payer: Self-pay | Admitting: Primary Care

## 2021-01-14 ENCOUNTER — Telehealth (HOSPITAL_COMMUNITY): Payer: Self-pay | Admitting: *Deleted

## 2021-01-14 NOTE — Telephone Encounter (Signed)
Attempted to call patient regarding upcoming appointment- no answer, unable to leave a message.  Logan Nelson  

## 2021-01-19 ENCOUNTER — Telehealth (HOSPITAL_COMMUNITY): Payer: Self-pay | Admitting: *Deleted

## 2021-01-19 NOTE — Telephone Encounter (Signed)
Left message on voicemail per DPR in reference to upcoming appointment scheduled on 01/21/21 at 1400 with detailed instructions given per Myocardial Perfusion Study Information Sheet for the test. LM to arrive 15 minutes early, and that it is imperative to arrive on time for appointment to keep from having the test rescheduled. If you need to cancel or reschedule your appointment, please call the office within 24 hours of your appointment. Failure to do so may result in a cancellation of your appointment, and a $50 no show fee. Phone number given for call back for any questions. Karle Desrosier, Adelene Idler

## 2021-01-21 ENCOUNTER — Other Ambulatory Visit: Payer: Self-pay

## 2021-01-21 ENCOUNTER — Ambulatory Visit (HOSPITAL_COMMUNITY): Payer: Self-pay | Attending: Cardiology

## 2021-01-21 ENCOUNTER — Ambulatory Visit (HOSPITAL_COMMUNITY): Payer: Self-pay

## 2021-01-21 DIAGNOSIS — R072 Precordial pain: Secondary | ICD-10-CM

## 2021-01-21 DIAGNOSIS — R001 Bradycardia, unspecified: Secondary | ICD-10-CM

## 2021-01-21 DIAGNOSIS — R0600 Dyspnea, unspecified: Secondary | ICD-10-CM

## 2021-01-21 LAB — ECHOCARDIOGRAM STRESS TEST
Area-P 1/2: 3.39 cm2
S' Lateral: 3.1 cm

## 2021-01-21 MED ORDER — SODIUM CHLORIDE 0.9 % IV SOLN
10.0000 ug/kg/min | INTRAVENOUS | Status: AC
  Administered 2021-01-21: 40 ug/kg/min via INTRAVENOUS

## 2021-01-21 MED ORDER — PERFLUTREN LIPID MICROSPHERE
1.0000 mL | INTRAVENOUS | Status: AC | PRN
Start: 2021-01-21 — End: 2021-01-21
  Administered 2021-01-21: 1 mL via INTRAVENOUS
  Administered 2021-01-21: 2 mL via INTRAVENOUS
  Administered 2021-01-21: 1 mL via INTRAVENOUS
  Administered 2021-01-21: 2 mL via INTRAVENOUS

## 2021-01-25 ENCOUNTER — Telehealth: Payer: Self-pay | Admitting: *Deleted

## 2021-01-25 NOTE — Telephone Encounter (Signed)
Called patient w stress echo result. The patient asks if he is cleared for surgery. He said a hearnia repair surgery was scheduled previously but cancelled because of seeing cardiology.  I contacted [facility] and advised that if a cardiac clearance is required, we need a request sent to our office.  A message was left for the nurse to look into this and let us know what is needed.

## 2021-01-25 NOTE — Telephone Encounter (Signed)
After reviewing the chart I found what to be was a surgery that was cancelled last month for hernia repair. Pt's message today did not state who or where was doing his hernia repair. I decided to call the office who was going to do the previous surgery for hernia repair which was Dr. Danise Edge office. By luck I was able to contact the correct office. I s/w Logan Nelson at Dr. Danise Edge office and she confirmed for me that pt still needing surgery. Surgeon's office to fax over a clearance to our office today [phone] attn: pre op team/Logan Nelson. Once I have the fax for the clearance I will forward to the pre op pool.

## 2021-01-25 NOTE — Telephone Encounter (Signed)
-----   Message from Orbie Pyo, MD sent at 01/21/2021  3:47 PM EST ----- Let him know his stress test demonstrated no abnormalities indicative of heart blockages.

## 2021-01-25 NOTE — Telephone Encounter (Signed)
° °  Pre-operative Risk Assessment    Patient Name: Logan Nelson  DOB: [birth year] MRN: [id number]      Request for Surgical Clearance    Procedure:   HERNIA SURGERY  Date of Surgery:  Clearance TBD                                 Surgeon:  DR. PAUL STECHSCHULTE Surgeon's Group or Practice Name:  [facility] Phone number:  [phone] Fax number:  [phone] ATTN: Brendia Sacks, MA   Type of Clearance Requested:   - Medical    Type of Anesthesia:  General    Additional requests/questions:    Elpidio Anis   01/25/2021, 3:45 PM

## 2021-01-25 NOTE — Telephone Encounter (Signed)
I will forward this over to pre op provider to review clearance.

## 2021-01-25 NOTE — Telephone Encounter (Signed)
Preoperative team please contact requesting office and have them submit a formal preoperative cardiac clearance form.  Once we have clearance request we will be able to provide recommendations for cardiac clearance.   Thank you.  Joylene Grapes, NP

## 2021-01-26 NOTE — Telephone Encounter (Signed)
° °  Primary Cardiologist: Orbie Pyo, MD  Chart reviewed as part of pre-operative protocol coverage. Given past medical history and time since last visit, based on ACC/AHA guidelines, Santiel Katsumi Wisler would be at acceptable risk for the planned procedure without further cardiovascular testing.   Dobutamine stress echo on 01/21/2021 was low risk, negative for ischemia.   RCRI is class II risk, 0.9% risk of major cardiac event.   I will route this recommendation to the requesting party via Epic fax function and remove from pre-op pool.  Please call with questions.  Joylene Grapes, NP 01/26/2021, 9:11 AM

## 2021-01-28 NOTE — Telephone Encounter (Signed)
Pt has been made aware that his clearance has been granted and he can have his surgery rescheduled. Pt was thankful for the information.

## 2021-01-28 NOTE — Telephone Encounter (Signed)
Preoperative team, please contact patient and let him know that his preoperative cardiac evaluation has been approved.  It has been sent back to the requesting office.  I will remove him from the preoperative pool.  Thank you for your help.  Thomasene Ripple. Wynter Grave NP-C    01/28/2021, 12:31 PM [facility] [address] Office [phone] Fax [phone]

## 2021-01-28 NOTE — Telephone Encounter (Signed)
Pt is f/u on clearance

## 2021-03-14 ENCOUNTER — Emergency Department (HOSPITAL_COMMUNITY)
Admission: EM | Admit: 2021-03-14 | Discharge: 2021-03-14 | Disposition: A | Discharge status: Home / Self Care | Payer: Medicaid Other | Attending: Emergency Medicine | Admitting: Emergency Medicine

## 2021-03-14 ENCOUNTER — Encounter (HOSPITAL_COMMUNITY): Payer: Self-pay | Admitting: Emergency Medicine

## 2021-03-14 ENCOUNTER — Other Ambulatory Visit: Payer: Self-pay

## 2021-03-14 DIAGNOSIS — K439 Ventral hernia without obstruction or gangrene: Secondary | ICD-10-CM

## 2021-03-14 DIAGNOSIS — K469 Unspecified abdominal hernia without obstruction or gangrene: Secondary | ICD-10-CM | POA: Diagnosis not present

## 2021-03-14 DIAGNOSIS — Z79899 Other long term (current) drug therapy: Secondary | ICD-10-CM | POA: Insufficient documentation

## 2021-03-14 DIAGNOSIS — R109 Unspecified abdominal pain: Secondary | ICD-10-CM | POA: Diagnosis present

## 2021-03-14 MED ORDER — ONDANSETRON 8 MG PO TBDP: 8.0000 mg | ORAL_TABLET | Freq: Three times a day (TID) | ORAL | 0 refills | Status: DC | PRN

## 2021-03-14 MED ORDER — LACTATED RINGERS IV SOLN: INTRAVENOUS | Status: DC

## 2021-03-14 MED ORDER — LACTATED RINGERS IV BOLUS: 1000.0000 mL | Freq: Once | INTRAVENOUS | Status: DC

## 2021-03-14 MED ORDER — TRAMADOL HCL 50 MG PO TABS: 50.0000 mg | ORAL_TABLET | Freq: Four times a day (QID) | ORAL | 0 refills | Status: DC | PRN

## 2021-03-14 NOTE — ED Provider Notes (Signed)
[facility]-EMERGENCY DEPT Provider Note   CSN: [id number] Arrival date & time: 03/14/21  1730     History  Chief Complaint  Patient presents with   Abdominal Pain   Emesis    Logan Nelson is a 56 y.o. male.  56 year old male presents with chronic abdominal pain due to his known ventral wall hernias.  Has not had any emesis or fever today.  States he feels at his baseline.  Scheduled for surgery for repair of these hernias next month.  Has not been taking medications at this time.  States he feels at his baseline.      Home Medications Prior to Admission medications   Medication Sig Start Date End Date Taking? Authorizing Provider  amLODipine (NORVASC) 2.5 MG tablet Take 1 tablet (2.5 mg total) by mouth daily. 11/04/20 11/04/21  [name], MD  cyanocobalamin (,VITAMIN B-12,) 1000 MCG/ML injection Inject 1 mL Once a week for 4 weeks, then once a month thereafter. 11/04/20   [name], MD      Allergies    Patient has no known allergies.    Review of Systems   Review of Systems  All other systems reviewed and are negative.  Physical Exam Updated Vital Signs BP (!) 124/97    Pulse 89    Temp 97.7 F (36.5 C) (Oral)    Resp 16    SpO2 100%  Physical Exam Vitals and nursing note reviewed.  Constitutional:      General: He is not in acute distress.    Appearance: Normal appearance. He is well-developed. He is not toxic-appearing.  HENT:     Head: Normocephalic and atraumatic.  Eyes:     General: Lids are normal.     Conjunctiva/sclera: Conjunctivae normal.     Pupils: Pupils are equal, round, and reactive to light.  Neck:     Thyroid: No thyroid mass.     Trachea: No tracheal deviation.  Cardiovascular:     Rate and Rhythm: Normal rate and regular rhythm.     Heart sounds: Normal heart sounds. No murmur heard.   No gallop.  Pulmonary:     Effort: Pulmonary effort is normal. No respiratory distress.     Breath sounds: Normal  breath sounds. No stridor. No decreased breath sounds, wheezing, rhonchi or rales.  Abdominal:     General: There is no distension.     Palpations: Abdomen is soft.     Tenderness: There is no abdominal tenderness. There is no rebound.    Musculoskeletal:        General: No tenderness. Normal range of motion.     Cervical back: Normal range of motion and neck supple.  Skin:    General: Skin is warm and dry.     Findings: No abrasion or rash.  Neurological:     Mental Status: He is alert and oriented to person, place, and time. Mental status is at baseline.     GCS: GCS eye subscore is 4. GCS verbal subscore is 5. GCS motor subscore is 6.     Cranial Nerves: No cranial nerve deficit.     Sensory: No sensory deficit.     Motor: Motor function is intact.  Psychiatric:        Attention and Perception: Attention normal.        Speech: Speech normal.        Behavior: Behavior normal.    ED Results / Procedures / Treatments   Labs (all  labs ordered are listed, but only abnormal results are displayed) Labs Reviewed  LIPASE, BLOOD  COMPREHENSIVE METABOLIC PANEL  CBC  URINALYSIS, ROUTINE W REFLEX MICROSCOPIC    EKG None  Radiology No results found.  Procedures Procedures    Medications Ordered in ED Medications  lactated ringers bolus 1,000 mL (has no administration in time range)  lactated ringers infusion (has no administration in time range)    ED Course/ Medical Decision Making/ A&P                           Medical Decision Making Amount and/or Complexity of Data Reviewed Labs: ordered.  Risk Prescription drug management.  Old records reviewed. Patient with chronic abdominal wall hernias.  No evidence of obstruction or incarceration at this time.  Patient states he feels at his baseline at this time although he describes discomfort from the hernia sliding in and out.  Do not feel that he needs labs or imaging at this time.  No concern that he has an acute  surgical process.  Will prescribe Zofran and tramadol for his discomfort and have encouraged him to follow-up with his physician next week return precautions given        Final Clinical Impression(s) / ED Diagnoses Final diagnoses:  None    Rx / DC Orders ED Discharge Orders     None         Lorre Nick, MD 03/14/21 1827

## 2021-03-14 NOTE — ED Triage Notes (Addendum)
Patient Presents due to abdominal pain caused by and ulcer. Pain has also caused a decreased in appetite. He is scheduled to have surgery March 15th, but does not believe he can wait that long.Patient reports pain with eating. Vomiting usually helps to relieve that pain.

## 2021-03-29 ENCOUNTER — Other Ambulatory Visit: Payer: Self-pay

## 2021-03-29 ENCOUNTER — Emergency Department (HOSPITAL_BASED_OUTPATIENT_CLINIC_OR_DEPARTMENT_OTHER): Payer: Medicaid Other

## 2021-03-29 ENCOUNTER — Encounter (HOSPITAL_COMMUNITY): Payer: Self-pay

## 2021-03-29 ENCOUNTER — Emergency Department (HOSPITAL_COMMUNITY)
Admission: EM | Admit: 2021-03-29 | Discharge: 2021-03-30 | Discharge status: Against Medical Advice | Payer: Medicaid Other | Attending: Emergency Medicine | Admitting: Emergency Medicine

## 2021-03-29 DIAGNOSIS — W19XXXA Unspecified fall, initial encounter: Secondary | ICD-10-CM | POA: Insufficient documentation

## 2021-03-29 DIAGNOSIS — R109 Unspecified abdominal pain: Secondary | ICD-10-CM | POA: Diagnosis not present

## 2021-03-29 DIAGNOSIS — R609 Edema, unspecified: Secondary | ICD-10-CM

## 2021-03-29 DIAGNOSIS — R110 Nausea: Secondary | ICD-10-CM | POA: Insufficient documentation

## 2021-03-29 DIAGNOSIS — R2243 Localized swelling, mass and lump, lower limb, bilateral: Secondary | ICD-10-CM | POA: Diagnosis not present

## 2021-03-29 DIAGNOSIS — Z5321 Procedure and treatment not carried out due to patient leaving prior to being seen by health care provider: Secondary | ICD-10-CM | POA: Diagnosis not present

## 2021-03-29 DIAGNOSIS — R209 Unspecified disturbances of skin sensation: Secondary | ICD-10-CM | POA: Diagnosis not present

## 2021-03-29 LAB — CBC WITH DIFFERENTIAL/PLATELET
Abs Immature Granulocytes: 0.01 10*3/uL (ref 0.00–0.07)
Basophils Absolute: 0 10*3/uL (ref 0.0–0.1)
Basophils Relative: 0 %
Eosinophils Absolute: 0 10*3/uL (ref 0.0–0.5)
Eosinophils Relative: 0 %
HCT: 30.3 % — ABNORMAL LOW (ref 39.0–52.0)
Hemoglobin: 10.4 g/dL — ABNORMAL LOW (ref 13.0–17.0)
Immature Granulocytes: 0 %
Lymphocytes Relative: 23 %
Lymphs Abs: 1.1 10*3/uL (ref 0.7–4.0)
MCH: 30.1 pg (ref 26.0–34.0)
MCHC: 34.3 g/dL (ref 30.0–36.0)
MCV: 87.6 fL (ref 80.0–100.0)
Monocytes Absolute: 0.6 10*3/uL (ref 0.1–1.0)
Monocytes Relative: 12 %
Neutro Abs: 3.2 10*3/uL (ref 1.7–7.7)
Neutrophils Relative %: 65 %
Platelets: 307 10*3/uL (ref 150–400)
RBC: 3.46 MIL/uL — ABNORMAL LOW (ref 4.22–5.81)
RDW: 18.1 % — ABNORMAL HIGH (ref 11.5–15.5)
WBC: 4.9 10*3/uL (ref 4.0–10.5)
nRBC: 0 % (ref 0.0–0.2)

## 2021-03-29 LAB — BASIC METABOLIC PANEL
Anion gap: 5 (ref 5–15)
BUN: 19 mg/dL (ref 6–20)
CO2: 22 mmol/L (ref 22–32)
Calcium: 8.1 mg/dL — ABNORMAL LOW (ref 8.9–10.3)
Chloride: 111 mmol/L (ref 98–111)
Creatinine, Ser: 0.66 mg/dL (ref 0.61–1.24)
GFR, Estimated: >60 mL/min (ref >60)
Glucose, Bld: 100 mg/dL — ABNORMAL HIGH (ref 70–99)
Potassium: 2.9 mmol/L — ABNORMAL LOW (ref 3.5–5.1)
Sodium: 138 mmol/L (ref 135–145)

## 2021-03-29 LAB — PROTIME-INR
INR: 1.2 (ref 0.8–1.2)
Prothrombin Time: 15.3 seconds — ABNORMAL HIGH (ref 11.4–15.2)

## 2021-03-29 LAB — APTT: aPTT: 36 seconds (ref 24–36)

## 2021-03-29 NOTE — Progress Notes (Signed)
BLE venous duplex has been completed.  Preliminary results given to [employer], PA-C.  Results can be found under chart review under CV PROC. 03/29/2021 6:04 PM Soma Lizak RVT, RDMS

## 2021-03-29 NOTE — ED Triage Notes (Addendum)
Patient c/o abdominal pain "all over." When asked about the abdominal pain the patient became angry and stated that he did not want to talk about it. After explaining to patient about triage, he angrily stated that the medicine that he was given for pain and nausea did not work.  Patient has swelling to both legs and feet and states he has had 2 falls yesterday and 1 fall today. Patient denies LOC or hitting his head.  Patient also reports numbness and tingling to the right hand. Patient states he has had numbness and tingling in his left hand which is old,but the right hand is new.

## 2021-03-29 NOTE — ED Provider Triage Note (Signed)
Emergency Medicine Provider Triage Evaluation Note  Logan Nelson , Nelson 56 y.o. male  was evaluated in triage.  Pt complains of bilateral lower extremity leg swelling.  Has not tried any medications for his symptoms.  Denies history of heart failure or taking diuretics.  Denies past medical history of DVT/PE or anticoagulant use.   Review of Systems  Positive: As per HPI above Negative:   Physical Exam  BP 119/88 (BP Location: Left Arm)    Pulse 74    Temp (!) 97.5 F (36.4 C) (Oral)    Resp 16    Ht 5' 6.5" (1.689 m)    Wt 73 kg    SpO2 99%    BMI 25.60 kg/m  Gen:   Awake, no distress   Resp:  Normal effort  MSK:   Moves extremities without difficulty  Other:  Positive Homans' sign to left lower extremity.  Left lower extremity measuring approximately 42-1/2 cm.  Right lower extremity measuring approximately 38-1/2 cm.  See picture below.    Medical Decision Making  Medically screening exam initiated at 4:33 PM.  Appropriate orders placed.  Logan Nelson was informed that the remainder of the evaluation will be completed by another provider, this initial triage assessment does not replace that evaluation, and the importance of remaining in the ED until their evaluation is complete.    Logan Cahoon A, PA-C 03/29/21 1641

## 2021-03-30 ENCOUNTER — Encounter (HOSPITAL_COMMUNITY): Payer: Self-pay

## 2021-03-30 ENCOUNTER — Emergency Department (HOSPITAL_COMMUNITY)
Admission: EM | Admit: 2021-03-30 | Discharge: 2021-03-30 | Disposition: A | Discharge status: Home / Self Care | Payer: Medicaid Other | Attending: Emergency Medicine | Admitting: Emergency Medicine

## 2021-03-30 DIAGNOSIS — K469 Unspecified abdominal hernia without obstruction or gangrene: Secondary | ICD-10-CM | POA: Diagnosis not present

## 2021-03-30 DIAGNOSIS — R2243 Localized swelling, mass and lump, lower limb, bilateral: Secondary | ICD-10-CM | POA: Insufficient documentation

## 2021-03-30 DIAGNOSIS — Z5321 Procedure and treatment not carried out due to patient leaving prior to being seen by health care provider: Secondary | ICD-10-CM | POA: Diagnosis not present

## 2021-03-30 LAB — COMPREHENSIVE METABOLIC PANEL
ALT: 100 U/L — ABNORMAL HIGH (ref 0–44)
AST: 59 U/L — ABNORMAL HIGH (ref 15–41)
Albumin: 2.9 g/dL — ABNORMAL LOW (ref 3.5–5.0)
Alkaline Phosphatase: 102 U/L (ref 38–126)
Anion gap: 7 (ref 5–15)
BUN: 22 mg/dL — ABNORMAL HIGH (ref 6–20)
CO2: 21 mmol/L — ABNORMAL LOW (ref 22–32)
Calcium: 8.2 mg/dL — ABNORMAL LOW (ref 8.9–10.3)
Chloride: 112 mmol/L — ABNORMAL HIGH (ref 98–111)
Creatinine, Ser: 0.72 mg/dL (ref 0.61–1.24)
GFR, Estimated: >60 mL/min (ref >60)
Glucose, Bld: 83 mg/dL (ref 70–99)
Potassium: 2.9 mmol/L — ABNORMAL LOW (ref 3.5–5.1)
Sodium: 140 mmol/L (ref 135–145)
Total Bilirubin: 1.2 mg/dL (ref 0.3–1.2)
Total Protein: 6.1 g/dL — ABNORMAL LOW (ref 6.5–8.1)

## 2021-03-30 LAB — CBC WITH DIFFERENTIAL/PLATELET
Abs Immature Granulocytes: 0.02 10*3/uL (ref 0.00–0.07)
Basophils Absolute: 0 10*3/uL (ref 0.0–0.1)
Basophils Relative: 0 %
Eosinophils Absolute: 0 10*3/uL (ref 0.0–0.5)
Eosinophils Relative: 0 %
HCT: 32.1 % — ABNORMAL LOW (ref 39.0–52.0)
Hemoglobin: 11 g/dL — ABNORMAL LOW (ref 13.0–17.0)
Immature Granulocytes: 0 %
Lymphocytes Relative: 31 %
Lymphs Abs: 1.6 10*3/uL (ref 0.7–4.0)
MCH: 29.7 pg (ref 26.0–34.0)
MCHC: 34.3 g/dL (ref 30.0–36.0)
MCV: 86.8 fL (ref 80.0–100.0)
Monocytes Absolute: 0.5 10*3/uL (ref 0.1–1.0)
Monocytes Relative: 10 %
Neutro Abs: 3 10*3/uL (ref 1.7–7.7)
Neutrophils Relative %: 59 %
Platelets: 335 10*3/uL (ref 150–400)
RBC: 3.7 MIL/uL — ABNORMAL LOW (ref 4.22–5.81)
RDW: 18 % — ABNORMAL HIGH (ref 11.5–15.5)
WBC: 5.2 10*3/uL (ref 4.0–10.5)
nRBC: 0 % (ref 0.0–0.2)

## 2021-03-30 LAB — BRAIN NATRIURETIC PEPTIDE: B Natriuretic Peptide: 63.5 pg/mL (ref 0.0–100.0)

## 2021-03-30 NOTE — ED Triage Notes (Signed)
Pt arrived via POV, states he was seen yesterday, left before given results. C/o diffuse abd pain, and swelling in legs.

## 2021-03-30 NOTE — ED Provider Triage Note (Signed)
Emergency Medicine Provider Triage Evaluation Note  Logan Nelson , a 56 y.o. male  was evaluated in triage.  Pt complains of bilateral leg swelling, ongoing large abdominal hernia. Was seen yesterday and received imaging. Bilaterally no DVT, some question of SBO / chronic SBO. Patient reports he did have a bowel movement this morning, some bloody stool, no active bleeding.  Review of Systems  Positive: As above Negative: As above  Physical Exam  BP (!) 142/63 (BP Location: Left Arm)    Pulse 89    Temp 98.8 F (37.1 C) (Oral)    Resp 16    SpO2 95%  Gen:   Awake, no distress   Resp:  Normal effort  MSK:   Moves extremities without difficulty  Other:  Large abdominal hernia, bilateral edema 2+  Medical Decision Making  Medically screening exam initiated at 11:16 AM.  Appropriate orders placed.  Logan Nelson was informed that the remainder of the evaluation will be completed by another provider, this initial triage assessment does not replace that evaluation, and the importance of remaining in the ED until their evaluation is complete.  Workup from yesterday complete, repeat basic labs and covid   Olene Floss, [place] 03/30/21 1118

## 2021-04-07 ENCOUNTER — Other Ambulatory Visit (HOSPITAL_COMMUNITY): Payer: Self-pay

## 2021-04-08 NOTE — Patient Instructions (Addendum)
DUE TO COVID-19 ONLY ONE VISITOR IS ALLOWED TO COME WITH YOU AND STAY IN THE WAITING ROOM ONLY DURING PRE OP AND PROCEDURE.   ?**NO VISITORS ARE ALLOWED IN THE SHORT STAY AREA OR RECOVERY ROOM!!** ? ? ?IF YOU WILL BE ADMITTED INTO THE HOSPITAL YOU ARE ALLOWED ONLY TWO SUPPORT PEOPLE DURING VISITATION HOURS ONLY (7 AM -8PM)   ? ?Up to two visitors ages 16+ are allowed at one time in a patient's room.  The visitors may rotate out with other people throughout the day.  Additionally, up to two children between the ages of 32 and 12 are allowed and do not count toward the number of allowed visitors.  Children within this age range must be accompanied by an adult visitor.  One adult visitor may remain with the patient overnight and must be in the room by 8 PM. ? ?The support person(s) must pass our screening, gel in and out, and wear a mask at all times, including in the patient?s room. ?Patients must also wear a mask when staff or their support person are in the room. ?Visitors GUEST BADGE MUST BE WORN VISIBLY  ?One adult visitor may remain with you overnight and MUST be in the room by 8 P.M. ? ?No visitors under the age of 11. Any visitor under the age of 41 must be accompanied by an adult.  ? ?You are not required to quarantine, however you are required to wear a well-fitted mask when you are out and around people not in your household.  ?Hand Hygiene often ?Do NOT share personal items ?Notify your provider if you are in close contact with someone who has COVID or you develop fever 100.4 or greater, new onset of sneezing, cough, sore throat, shortness of breath or body aches. ? ?     ? Your procedure is scheduled on: Wednesday, 04-21-21 ? ? Report to [facility] Main Entrance ? ?  Report to admitting a 5:15 AM ? ? Call this number if you have problems the morning of surgery [phone] ? ? Do not eat food :After Midnight. ? ? After Midnight you may have the following liquids until 5:00 AM PM DAY OF  SURGERY ? ?Water ?Black Coffee (sugar ok, NO MILK/CREAM OR CREAMERS)  ?Tea (sugar ok, NO MILK/CREAM OR CREAMERS) regular and decaf                             ?Plain Jell-O (NO RED)                                           ?Fruit ices (not with fruit pulp, NO RED)                                     ?Popsicles (NO RED)                                                                  ?Juice: apple, WHITE grape, WHITE cranberry ?Sports drinks like Gatorade (NO RED) ?Clear broth(vegetable,chicken,beef)         ?  ?  ?  Oral Hygiene is also important to reduce your risk of infection.                                    ?Remember - BRUSH YOUR TEETH THE MORNING OF SURGERY WITH YOUR REGULAR TOOTHPASTE ? ? Do NOT smoke after Midnight ? ? Take these medicines the morning of surgery with A SIP OF WATER: None ?                  ?           You may not have any metal on your body including  jewelry, and body piercing ? ?           Do not wear lotions, powders, cologne, or deodorant ? ?            Men may shave face and neck.  ? ? Contacts, dentures or bridgework may not be worn into surgery. ? ? Bring small overnight bag day of surgery. ? ? Do not bring valuables to the hospital. [facility] IS NOT RESPONSIBLE   FOR VALUABLES. ?  ?Please read over the following fact sheets you were given: IF YOU HAVE QUESTIONS ABOUT YOUR PRE-OP INSTRUCTIONS PLEASE CALL [phone] [facility]  ? ? [place] - Preparing for Surgery ?Before surgery, you can play an important role.  Because skin is not sterile, your skin needs to be as free of germs as possible.  You can reduce the number of germs on your skin by washing with CHG (chlorahexidine gluconate) soap before surgery.  CHG is an antiseptic cleaner which kills germs and bonds with the skin to continue killing germs even after washing. ?Please DO NOT use if you have an allergy to CHG or antibacterial soaps.  If your skin becomes reddened/irritated stop using the CHG and inform your nurse when you  arrive at Short Stay. ?Do not shave (including legs and underarms) for at least 48 hours prior to the first CHG shower.  You may shave your face/neck. ? ?Please follow these instructions carefully: ? 1.  Shower with CHG Soap the night before surgery and the  morning of surgery. ? 2.  If you choose to wash your hair, wash your hair first as usual with your normal  shampoo. ? 3.  After you shampoo, rinse your hair and body thoroughly to remove the shampoo.                            ? 4.  Use CHG as you would any other liquid soap.  You can apply chg directly to the skin and wash.  Gently with a scrungie or clean washcloth. ? 5.  Apply the CHG Soap to your body ONLY FROM THE NECK DOWN.   Do   not use on face/ open      ?                     Wound or open sores. Avoid contact with eyes, ears mouth and   genitals (private parts).  ?                     Engineering geologist,  Genitals (private parts) with your normal soap. ?            6.  Wash thoroughly, paying special attention to the  area where your    surgery  will be performed. ? 7.  Thoroughly rinse your body with warm water from the neck down. ? 8.  DO NOT shower/wash with your normal soap after using and rinsing off the CHG Soap. ?               9.  Pat yourself dry with a clean towel. ?           10.  Wear clean pajamas. ?           11.  Place clean sheets on your bed the night of your first shower and do not  sleep with pets. ?Day of Surgery : ?Do not apply any lotions/deodorants the morning of surgery.  Please wear clean clothes to the hospital/surgery center. ? ?FAILURE TO FOLLOW THESE INSTRUCTIONS MAY RESULT IN THE CANCELLATION OF YOUR SURGERY ? ?PATIENT SIGNATURE_________________________________ ? ?NURSE SIGNATURE__________________________________ ? ?________________________________________________________________________  ?

## 2021-04-08 NOTE — Progress Notes (Addendum)
COVID swab appointment: Same Day Covid Test due to transportation ? ?COVID Vaccine Completed: Yes x2 ?Date COVID Vaccine completed: ?Has received booster: ?COVID vaccine manufacturer: [employer]  ? ?Date of COVID positive in last 90 days:  No ? ?PCP - [name], MD ?Cardiologist - Alverda Skeans, MD ? ?Cardiac clearance in Epic dated 01-26-21 by Bernadene Person, NP ? ?Chest x-ray - 10-13-20 Epic ?EKG - 12-22-20 Epic ?Stress Test - 01-21-21 Epic ?ECHO - 01-21-21 Epic ?Cardiac Cath - Unsure ?Pacemaker/ICD device last checked: ?Spinal Cord Stimulator: ? ?Bowel Prep - N/A ? ?Sleep Study - N/A ?CPAP -  ? ?Fasting Blood Sugar - N/A ?Checks Blood Sugar _____ times a day ? ?Blood Thinner Instructions: N/A ?Aspirin Instructions: ?Last Dose: ? ?Activity level:   Can slowly go up a flight of stairs and perform activities of daily living without  symptoms of chest pain.  Patient stated that he does have shortness of breath at times with activity. ?   ?Anesthesia review:  Bradycardia and dyspnea evaluated by cardiology.  Patient stated that he has been having lower extremity edema which has been evaluated.  Patient complains of weakness, loss of appetite and weight loss which has also been evaluated.   ? ?Patient denies shortness of breath, fever, cough and chest pain at PAT appointment ? ? ?Patient verbalized understanding of instructions that were given to them at the PAT appointment. Patient was also instructed that they will need to review over the PAT instructions again at home before surgery.  ?

## 2021-04-09 ENCOUNTER — Encounter (HOSPITAL_COMMUNITY): Payer: Self-pay

## 2021-04-09 ENCOUNTER — Other Ambulatory Visit: Payer: Self-pay

## 2021-04-09 ENCOUNTER — Encounter (HOSPITAL_COMMUNITY)
Admission: RE | Admit: 2021-04-09 | Discharge: 2021-04-09 | Disposition: A | Discharge status: Home / Self Care | Payer: Medicaid Other | Source: Ambulatory Visit | Attending: Surgery | Admitting: Surgery

## 2021-04-09 VITALS — BP 115/85 | HR 70 | Temp 98.3°F | Resp 20 | Ht 66.5 in | Wt 142.0 lb

## 2021-04-09 DIAGNOSIS — Z01818 Encounter for other preprocedural examination: Secondary | ICD-10-CM

## 2021-04-09 DIAGNOSIS — I2510 Atherosclerotic heart disease of native coronary artery without angina pectoris: Secondary | ICD-10-CM | POA: Diagnosis not present

## 2021-04-09 DIAGNOSIS — Z01812 Encounter for preprocedural laboratory examination: Secondary | ICD-10-CM | POA: Insufficient documentation

## 2021-04-09 HISTORY — DX: Repeated falls: R29.6

## 2021-04-09 HISTORY — DX: Localized edema: R60.0

## 2021-04-09 HISTORY — DX: Bradycardia, unspecified: R00.1

## 2021-04-09 HISTORY — DX: Anemia, unspecified: D64.9

## 2021-04-09 HISTORY — DX: Dyspnea, unspecified: R06.00

## 2021-04-09 HISTORY — DX: Gastro-esophageal reflux disease without esophagitis: K21.9

## 2021-04-09 LAB — BASIC METABOLIC PANEL
Anion gap: 5 (ref 5–15)
BUN: 24 mg/dL — ABNORMAL HIGH (ref 6–20)
CO2: 24 mmol/L (ref 22–32)
Calcium: 7.8 mg/dL — ABNORMAL LOW (ref 8.9–10.3)
Chloride: 110 mmol/L (ref 98–111)
Creatinine, Ser: 0.59 mg/dL — ABNORMAL LOW (ref 0.61–1.24)
GFR, Estimated: >60 mL/min (ref >60)
Glucose, Bld: 74 mg/dL (ref 70–99)
Potassium: 4 mmol/L (ref 3.5–5.1)
Sodium: 139 mmol/L (ref 135–145)

## 2021-04-11 ENCOUNTER — Observation Stay (HOSPITAL_COMMUNITY): Payer: Medicaid Other

## 2021-04-11 ENCOUNTER — Inpatient Hospital Stay (HOSPITAL_COMMUNITY)
Admission: EM | Admit: 2021-04-11 | Discharge: 2021-04-14 | DRG: 641 | Disposition: A | Discharge status: Home / Self Care | Payer: Medicaid Other | Attending: Internal Medicine | Admitting: Internal Medicine

## 2021-04-11 ENCOUNTER — Emergency Department (HOSPITAL_COMMUNITY): Payer: Medicaid Other

## 2021-04-11 ENCOUNTER — Encounter (HOSPITAL_COMMUNITY): Payer: Self-pay | Admitting: Emergency Medicine

## 2021-04-11 ENCOUNTER — Other Ambulatory Visit: Payer: Self-pay

## 2021-04-11 DIAGNOSIS — R278 Other lack of coordination: Secondary | ICD-10-CM

## 2021-04-11 DIAGNOSIS — R64 Cachexia: Secondary | ICD-10-CM | POA: Diagnosis present

## 2021-04-11 DIAGNOSIS — I639 Cerebral infarction, unspecified: Secondary | ICD-10-CM

## 2021-04-11 DIAGNOSIS — G8929 Other chronic pain: Secondary | ICD-10-CM | POA: Diagnosis present

## 2021-04-11 DIAGNOSIS — K4090 Unilateral inguinal hernia, without obstruction or gangrene, not specified as recurrent: Secondary | ICD-10-CM | POA: Diagnosis present

## 2021-04-11 DIAGNOSIS — R531 Weakness: Secondary | ICD-10-CM | POA: Diagnosis not present

## 2021-04-11 DIAGNOSIS — Z8249 Family history of ischemic heart disease and other diseases of the circulatory system: Secondary | ICD-10-CM

## 2021-04-11 DIAGNOSIS — M1990 Unspecified osteoarthritis, unspecified site: Secondary | ICD-10-CM | POA: Diagnosis present

## 2021-04-11 DIAGNOSIS — E46 Unspecified protein-calorie malnutrition: Secondary | ICD-10-CM

## 2021-04-11 DIAGNOSIS — R471 Dysarthria and anarthria: Secondary | ICD-10-CM | POA: Diagnosis present

## 2021-04-11 DIAGNOSIS — E538 Deficiency of other specified B group vitamins: Secondary | ICD-10-CM | POA: Diagnosis present

## 2021-04-11 DIAGNOSIS — Z20822 Contact with and (suspected) exposure to covid-19: Secondary | ICD-10-CM | POA: Diagnosis present

## 2021-04-11 DIAGNOSIS — E43 Unspecified severe protein-calorie malnutrition: Secondary | ICD-10-CM | POA: Diagnosis not present

## 2021-04-11 DIAGNOSIS — E8809 Other disorders of plasma-protein metabolism, not elsewhere classified: Secondary | ICD-10-CM

## 2021-04-11 DIAGNOSIS — Z87891 Personal history of nicotine dependence: Secondary | ICD-10-CM

## 2021-04-11 DIAGNOSIS — I444 Left anterior fascicular block: Secondary | ICD-10-CM | POA: Diagnosis present

## 2021-04-11 DIAGNOSIS — I10 Essential (primary) hypertension: Secondary | ICD-10-CM | POA: Diagnosis not present

## 2021-04-11 DIAGNOSIS — N400 Enlarged prostate without lower urinary tract symptoms: Secondary | ICD-10-CM | POA: Diagnosis not present

## 2021-04-11 DIAGNOSIS — E785 Hyperlipidemia, unspecified: Secondary | ICD-10-CM | POA: Diagnosis present

## 2021-04-11 DIAGNOSIS — K760 Fatty (change of) liver, not elsewhere classified: Secondary | ICD-10-CM

## 2021-04-11 DIAGNOSIS — F1011 Alcohol abuse, in remission: Secondary | ICD-10-CM | POA: Diagnosis present

## 2021-04-11 DIAGNOSIS — K429 Umbilical hernia without obstruction or gangrene: Secondary | ICD-10-CM | POA: Diagnosis present

## 2021-04-11 DIAGNOSIS — K439 Ventral hernia without obstruction or gangrene: Secondary | ICD-10-CM

## 2021-04-11 DIAGNOSIS — K432 Incisional hernia without obstruction or gangrene: Secondary | ICD-10-CM | POA: Diagnosis present

## 2021-04-11 DIAGNOSIS — K219 Gastro-esophageal reflux disease without esophagitis: Secondary | ICD-10-CM | POA: Diagnosis present

## 2021-04-11 LAB — RESP PANEL BY RT-PCR (FLU A&B, COVID) ARPGX2
Influenza A by PCR: NEGATIVE
Influenza B by PCR: NEGATIVE
SARS Coronavirus 2 by RT PCR: NEGATIVE

## 2021-04-11 LAB — CBC
HCT: 35.7 % — ABNORMAL LOW (ref 39.0–52.0)
Hemoglobin: 11.6 g/dL — ABNORMAL LOW (ref 13.0–17.0)
MCH: 29.7 pg (ref 26.0–34.0)
MCHC: 32.5 g/dL (ref 30.0–36.0)
MCV: 91.5 fL (ref 80.0–100.0)
Platelets: 334 10*3/uL (ref 150–400)
RBC: 3.9 MIL/uL — ABNORMAL LOW (ref 4.22–5.81)
RDW: 17.1 % — ABNORMAL HIGH (ref 11.5–15.5)
WBC: 5.6 10*3/uL (ref 4.0–10.5)
nRBC: 0 % (ref 0.0–0.2)

## 2021-04-11 LAB — BASIC METABOLIC PANEL
Anion gap: 6 (ref 5–15)
BUN: 19 mg/dL (ref 6–20)
CO2: 23 mmol/L (ref 22–32)
Calcium: 8.1 mg/dL — ABNORMAL LOW (ref 8.9–10.3)
Chloride: 111 mmol/L (ref 98–111)
Creatinine, Ser: 0.79 mg/dL (ref 0.61–1.24)
GFR, Estimated: >60 mL/min (ref >60)
Glucose, Bld: 102 mg/dL — ABNORMAL HIGH (ref 70–99)
Potassium: 3.2 mmol/L — ABNORMAL LOW (ref 3.5–5.1)
Sodium: 140 mmol/L (ref 135–145)

## 2021-04-11 LAB — HEPATIC FUNCTION PANEL
ALT: 94 U/L — ABNORMAL HIGH (ref 0–44)
AST: 66 U/L — ABNORMAL HIGH (ref 15–41)
Albumin: 2.6 g/dL — ABNORMAL LOW (ref 3.5–5.0)
Alkaline Phosphatase: 120 U/L (ref 38–126)
Bilirubin, Direct: 0.3 mg/dL — ABNORMAL HIGH (ref 0.0–0.2)
Indirect Bilirubin: 0.3 mg/dL (ref 0.3–0.9)
Total Bilirubin: 0.6 mg/dL (ref 0.3–1.2)
Total Protein: 5.7 g/dL — ABNORMAL LOW (ref 6.5–8.1)

## 2021-04-11 LAB — TROPONIN I (HIGH SENSITIVITY)
Troponin I (High Sensitivity): 13 ng/L (ref <18)
Troponin I (High Sensitivity): 11 ng/L (ref <18)

## 2021-04-11 LAB — PROTIME-INR
INR: 1.3 — ABNORMAL HIGH (ref 0.8–1.2)
Prothrombin Time: 16.3 seconds — ABNORMAL HIGH (ref 11.4–15.2)

## 2021-04-11 LAB — APTT: aPTT: 36 seconds (ref 24–36)

## 2021-04-11 IMAGING — DX DG CHEST 2V
2 series · 2 of 2 positions shown · non-contrast
Comparison: [DATE]

CLINICAL DATA: SOB

EXAM:
CHEST - 2 VIEW

[chest lat]
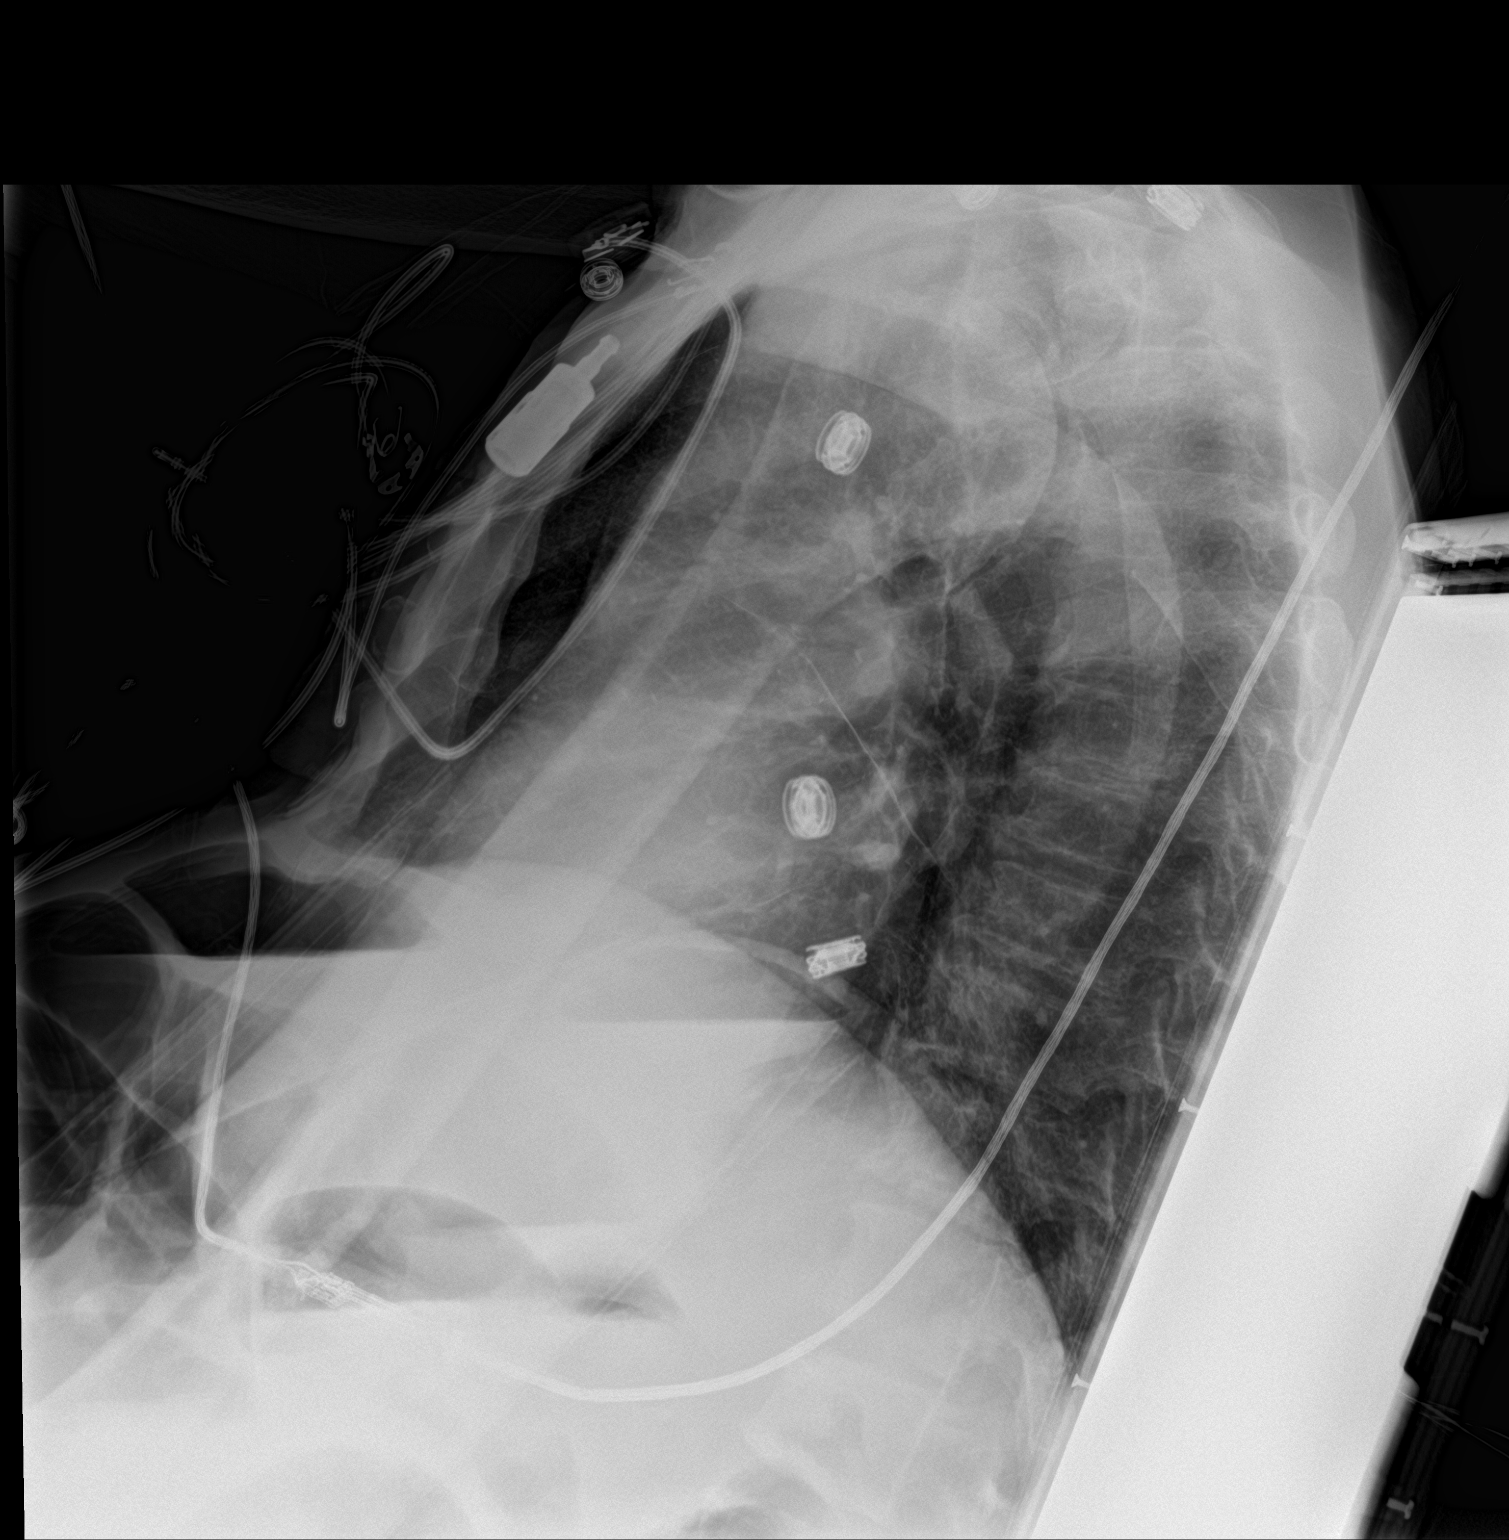

[chest ap]
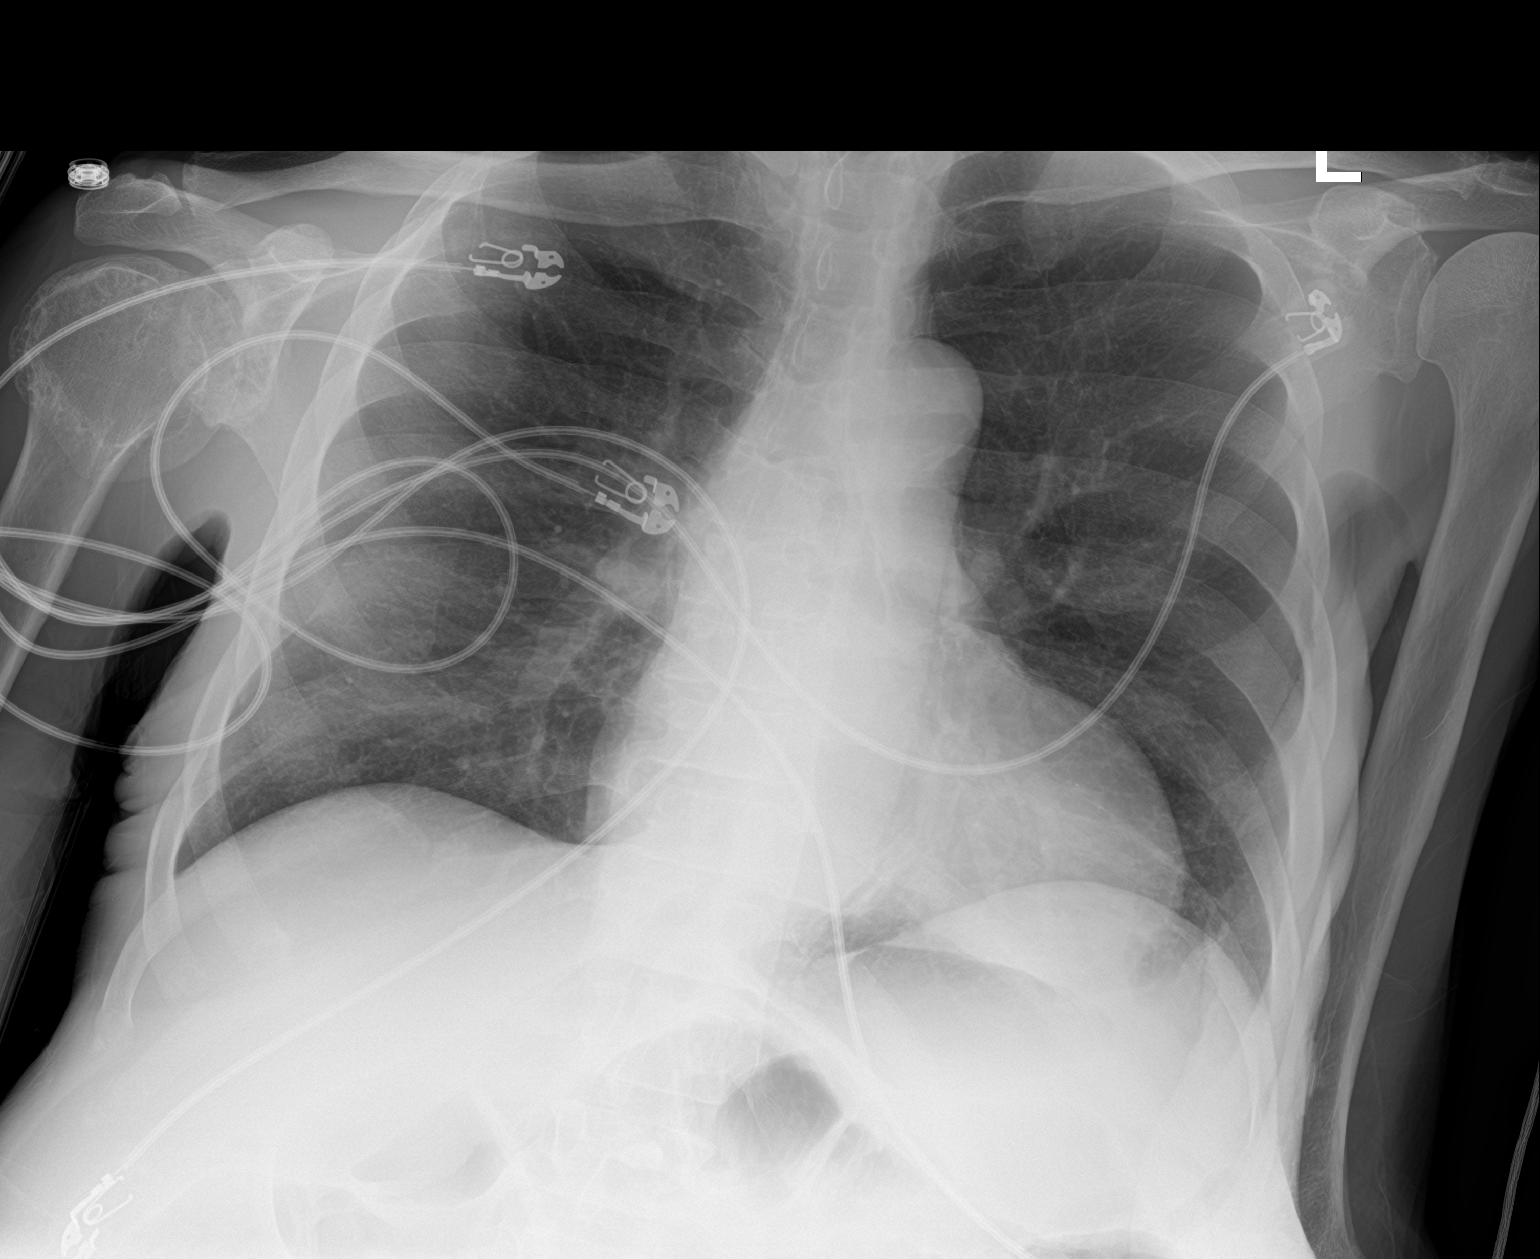

[2 of 2 positions shown; findings below may reference images not displayed]

FINDINGS: The cardiomediastinal silhouette is unchanged in contour. No pleural
effusion. No pneumothorax. No acute pleuroparenchymal abnormality.
Gaseous distension of bowel beneath the hemidiaphragms. Multilevel
degenerative changes of the thoracic spine. Limited assessment on
lateral radiograph secondary to arm positioning.
IMPRESSION: No acute cardiopulmonary abnormality.

## 2021-04-11 IMAGING — DX DG ABDOMEN 1V
1 series · 2 of 2 positions shown · non-contrast
Comparison: Abdominopelvic CT [DATE]

CLINICAL DATA: MRI clearance.  Ventral hernia.

EXAM:
ABDOMEN - 1 VIEW

[Series 1: abdomen · 0.14mm/px · 2 of 2 slices shown]
[im 1/2]
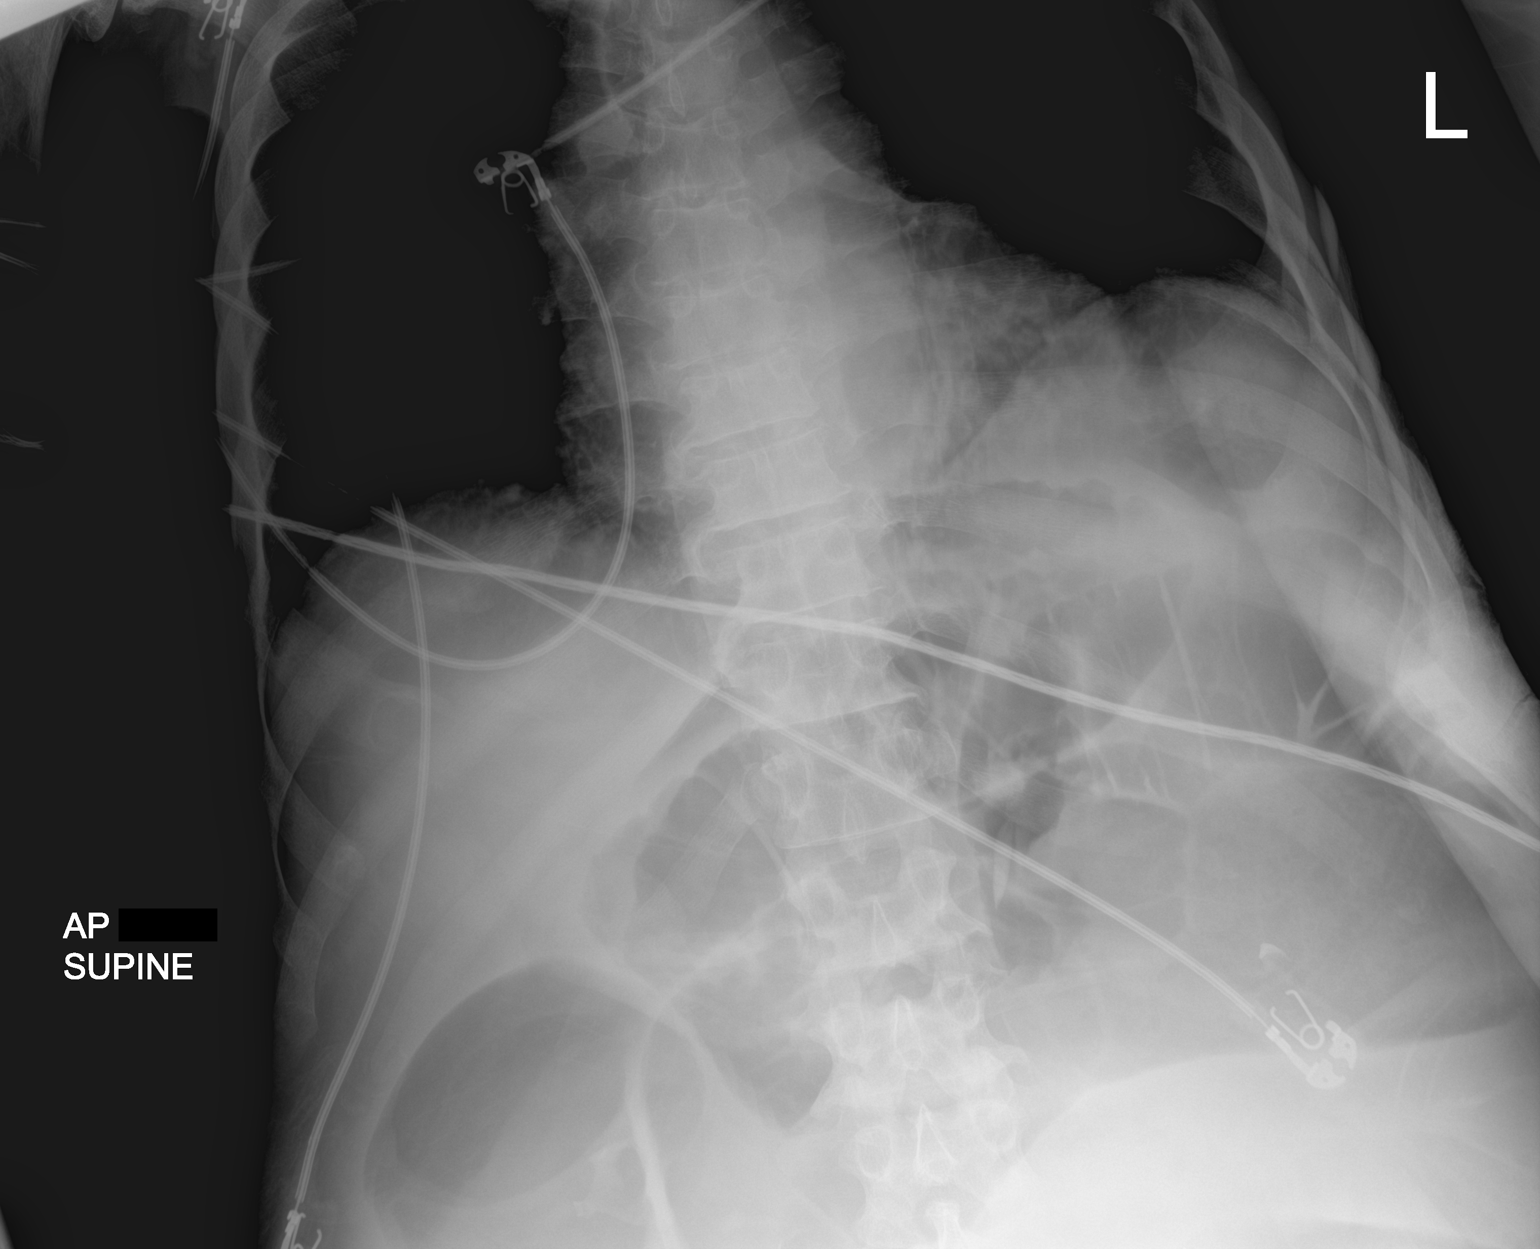
[im 2/2]
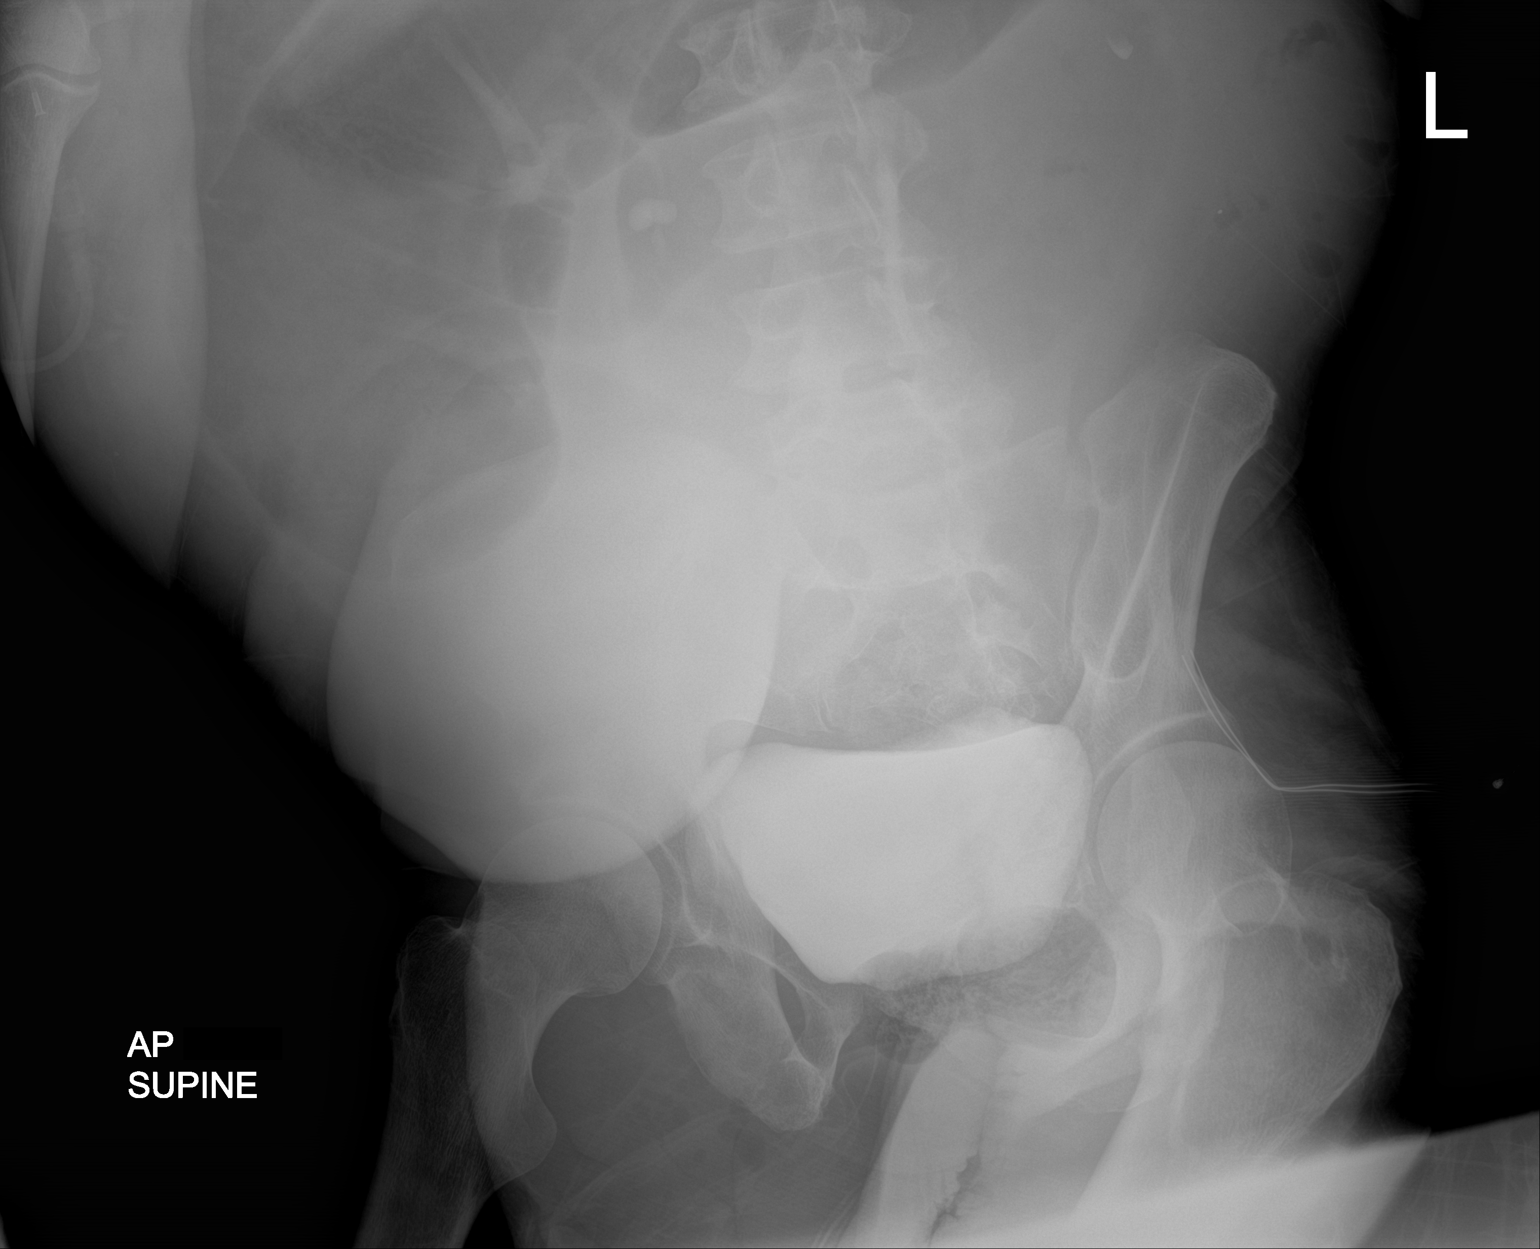

[2 of 2 positions shown; findings below may reference images not displayed]

FINDINGS: No visualized radiopaque foreign body or implanted medical device.
Excreted IV contrast within both renal collecting systems in the
urinary bladder. Gaseous distention of bowel in the upper abdomen
with small-bowel distention of at least 6.3 cm. Additional bowel
distension may be colonic. Right abdominal wall appears vertebra in,
suboptimally assessed on this frontal view.
IMPRESSION: 1. No visualized radiopaque foreign body or implanted medical device
to preclude MRI imaging.
2. Gaseous distention of bowel in the upper abdomen with small-bowel
distention of at least 6.3 cm. Additional bowel distension may be
colonic.

## 2021-04-11 IMAGING — CT CT ANGIO HEAD-NECK (W OR W/O PERF)
2 of 7 series · 8 of 33 positions shown · non-contrast
Comparison: Same day head CT [DATE].

CLINICAL DATA: Neuro deficit, acute, stroke suspected.

EXAM:
CT ANGIOGRAPHY HEAD AND NECK
TECHNIQUE: Multidetector CT imaging of the head and neck was performed using
the standard protocol during bolus administration of intravenous
contrast. Multiplanar CT image reconstructions and MIPs were
obtained to evaluate the vascular anatomy. Carotid stenosis
measurements (when applicable) are obtained utilizing NASCET
criteria, using the distal internal carotid diameter as the
denominator.

[Series 5: cta neck/head · axial · 0.48mm/px · z∈[-167,-59]mm · 2 of 162 slices shown]
[im 54/162  soft-tissue]
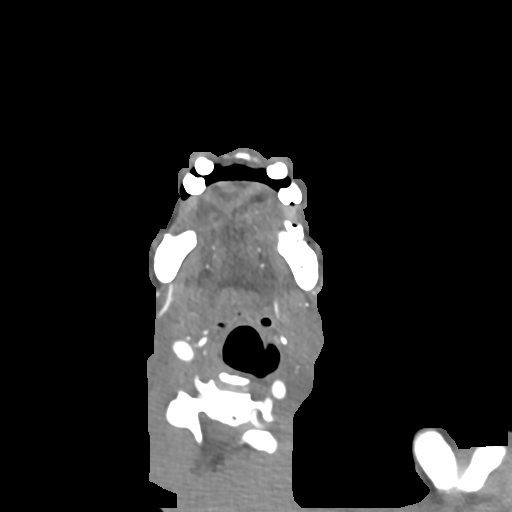
[im 108/162  bone]
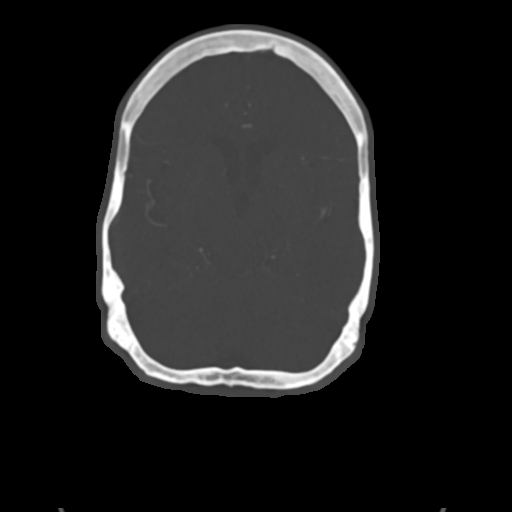

[Series 7: ax thins · axial · 0.39mm/px · z∈[-267,-68]mm · 6 of 325 slices shown]
[im 47/325  soft-tissue]
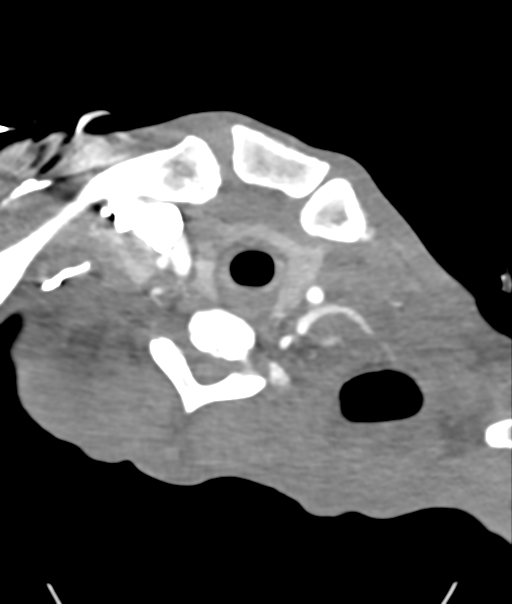
[im 93/325  soft-tissue]
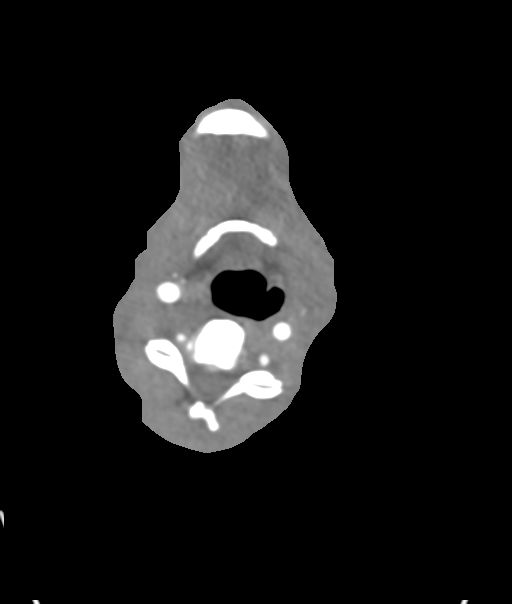
[im 139/325  soft-tissue]
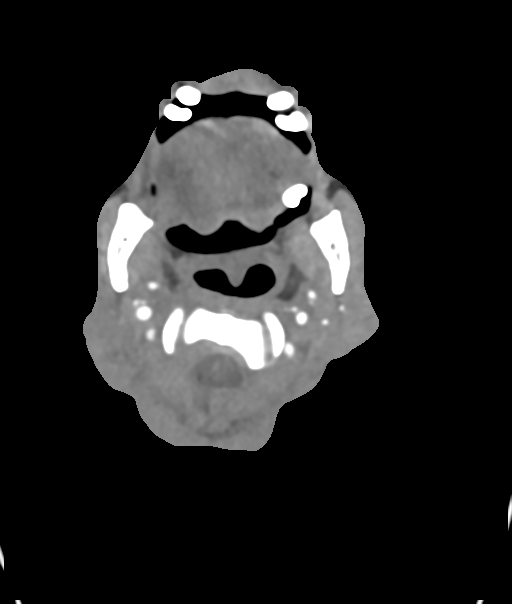
[im 186/325  soft-tissue]
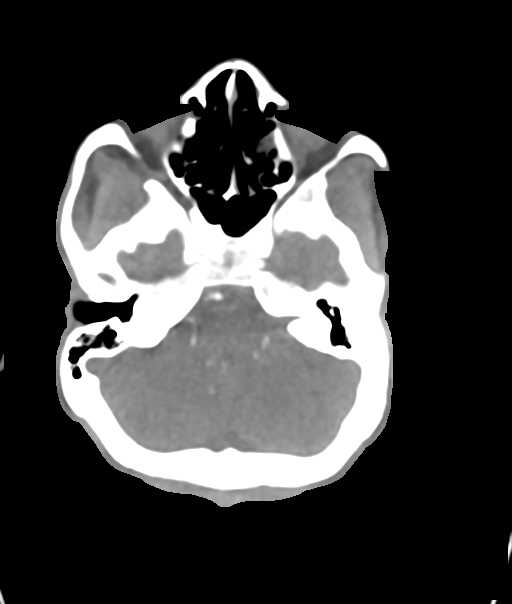
[im 232/325  soft-tissue]
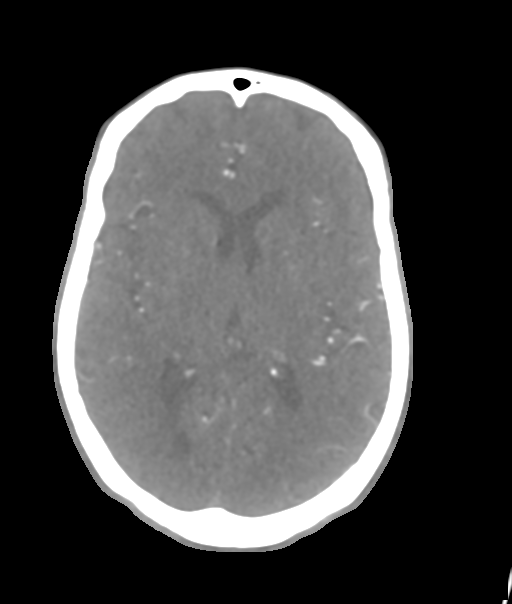
[im 278/325  soft-tissue]
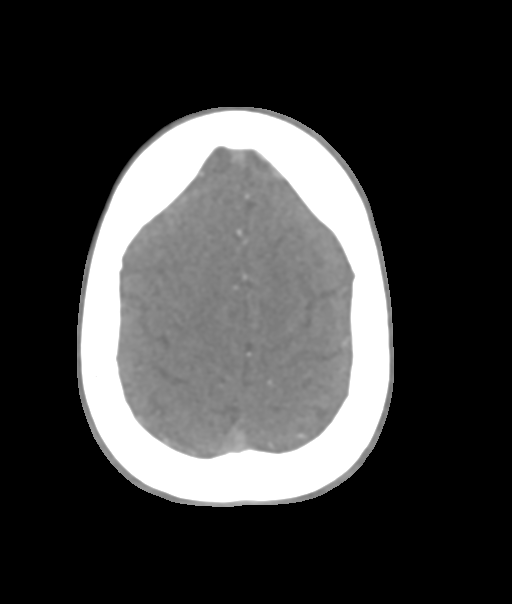

[8 of 33 positions shown; findings below may reference images not displayed]

RADIATION DOSE REDUCTION: This exam was performed according to the
departmental dose-optimization program which includes automated
exposure control, adjustment of the mA and/or kV according to
patient size and/or use of iterative reconstruction technique.

CONTRAST:  100mL OMNIPAQUE IOHEXOL 350 MG/ML SOLN
FINDINGS: CTA NECK FINDINGS

Aortic arch: Standard aortic branching. No hemodynamically
significant innominate or proximal subclavian artery stenosis.

Right carotid system: CCA and ICA patent within the neck without
stenosis or significant atherosclerotic disease.

Left carotid system: CCA and ICA patent within the neck without
stenosis or significant atherosclerotic disease.

Vertebral arteries: Vertebral arteries patent within the neck
without stenosis or significant atherosclerotic disease. The left
vertebral artery is dominant.

Skeleton: Reversal of the expected cervical lordosis. No acute bony
abnormality or aggressive osseous lesion.

Other neck: No appreciable neck mass or cervical lymphadenopathy.

Upper chest: No consolidation within the imaged lung apices.

Review of the MIP images confirms the above findings

CTA HEAD FINDINGS

Anterior circulation:

The intracranial internal carotid arteries are patent. The M1 middle
cerebral arteries are patent. No M2 proximal branch occlusion or
high-grade proximal stenosis is identified. The anterior cerebral
arteries are patent. No intracranial aneurysm is identified.

Posterior circulation:

The intracranial vertebral arteries are patent. The basilar artery
is patent. The posterior cerebral arteries are patent. Posterior
communicating arteries are present bilaterally.

Venous sinuses: The dural venous sinuses are poorly assessed due to
contrast timing.

Anatomic variants: None significant.

Review of the MIP images confirms the above findings
IMPRESSION: CTA neck:

The common carotid, internal carotid and vertebral arteries are
patent within the neck without stenosis.

CTA head:

No intracranial large vessel occlusion or proximal high-grade
arterial stenosis.

## 2021-04-11 IMAGING — MR MR CERVICAL SPINE W/O CM
4 of 5 series · 19 of 48 positions shown · non-contrast
Comparison: No prior MRI of the cervical spine, correlation is made
with [DATE] CTA head and neck

CLINICAL DATA: Myelopathy, acute

EXAM:
MRI CERVICAL SPINE WITHOUT CONTRAST
TECHNIQUE: Multiplanar, multisequence MR imaging of the cervical spine was
performed. No intravenous contrast was administered.

[Series 15: T2 · sagittal · 3.0mm · 0.43mm/px · 4 of 17 slices shown (1 of 2)]
[im 1/17]
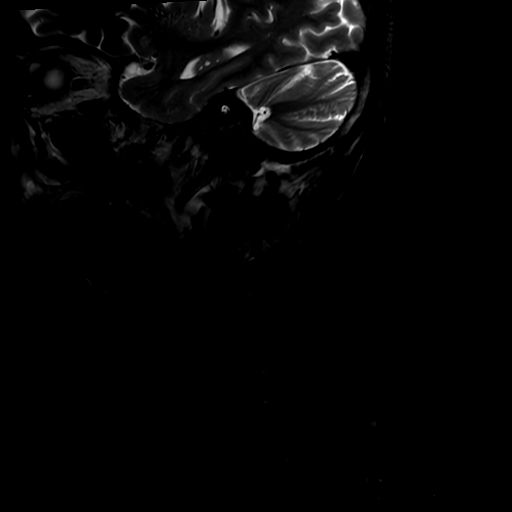
[im 6/17]
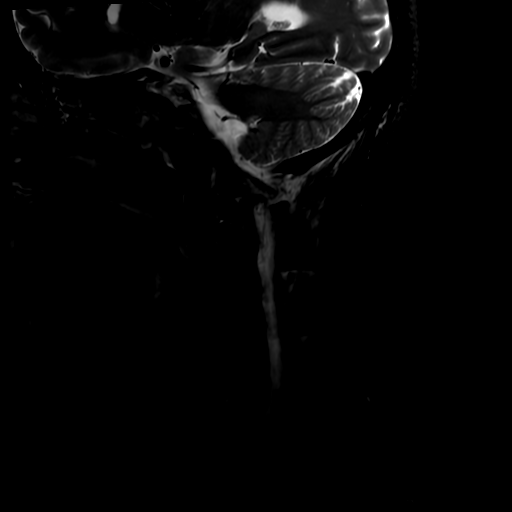
[im 11/17]
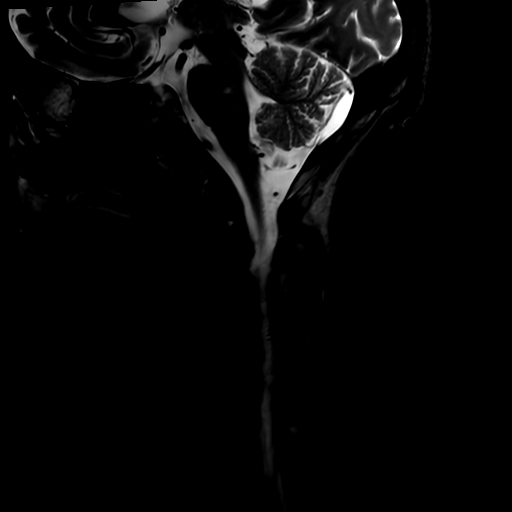
[im 17/17]
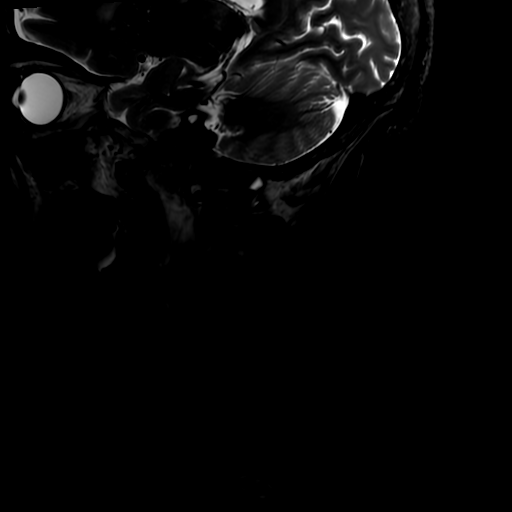

[Series 16: FLAIR · sagittal · 3.0mm · 0.43mm/px · 3 of 17 slices shown]
[im 1/17]
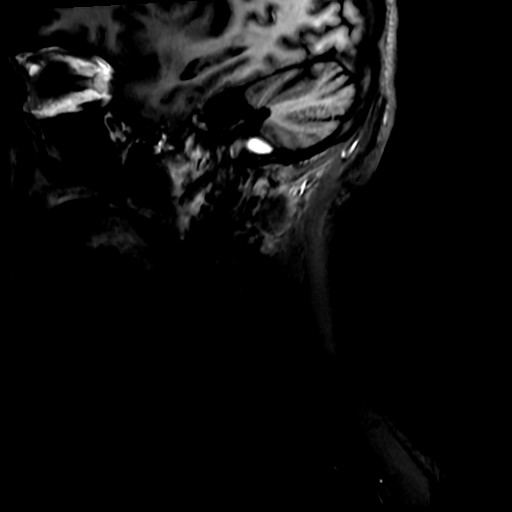
[im 11/17]
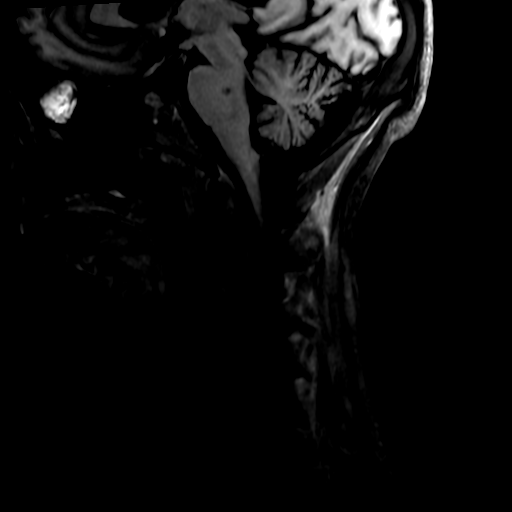
[im 17/17]
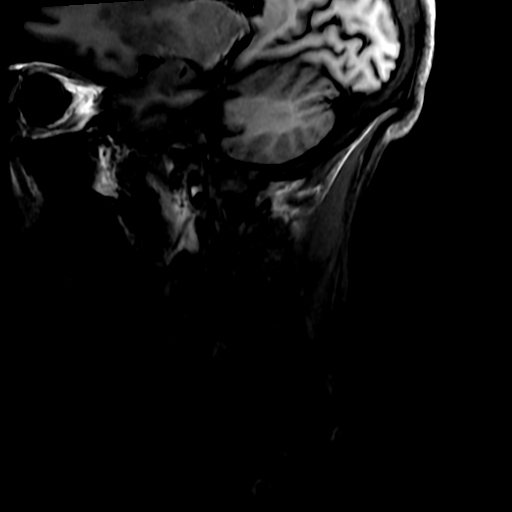

[Series 17: STIR · sagittal · 3.0mm · 0.43mm/px · 3 of 17 slices shown]
[im 1/17]
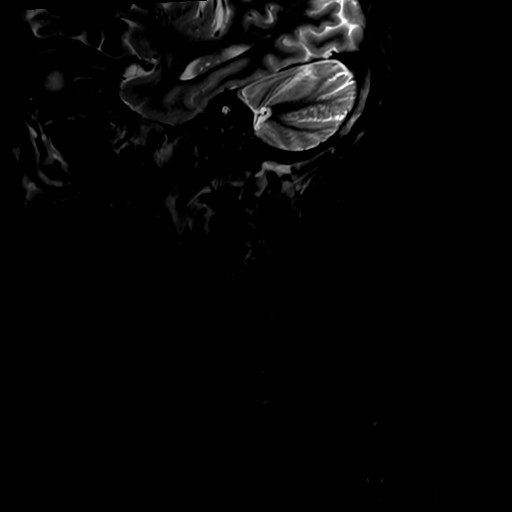
[im 11/17]
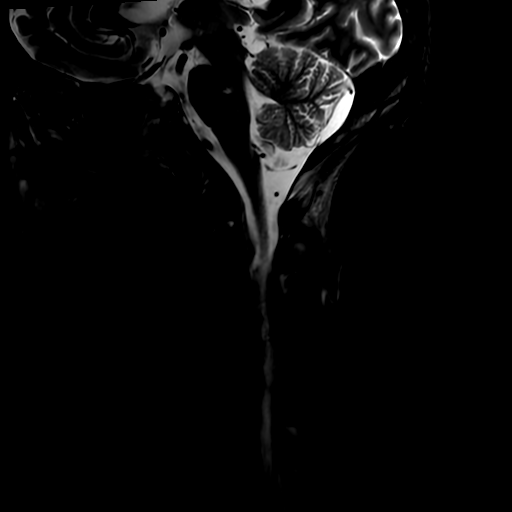
[im 17/17]
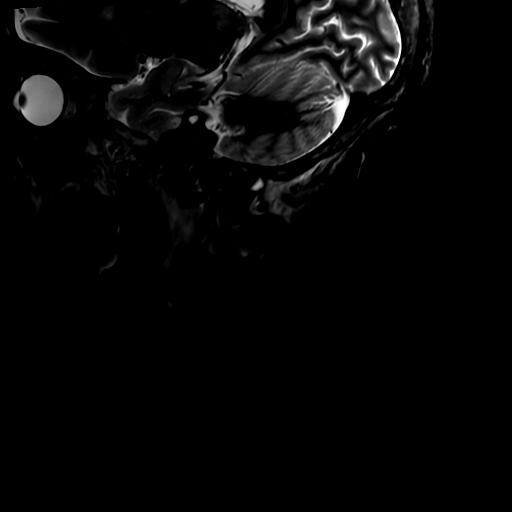

[Series 20: T2 · axial · 3.0mm · 0.35mm/px · z∈[-211,-83]mm · 9 of 40 slices shown (2 of 2)]
[im 1/40]
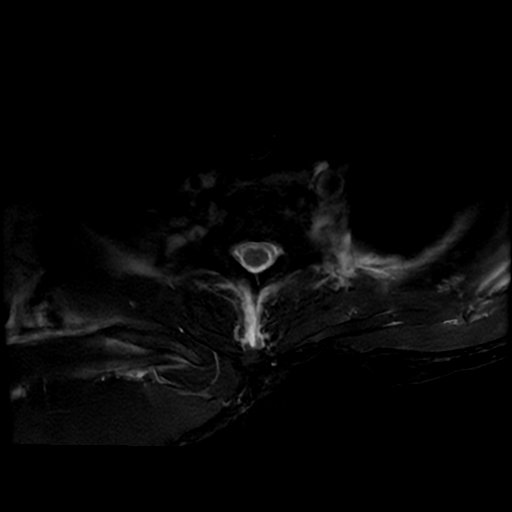
[im 5/40]
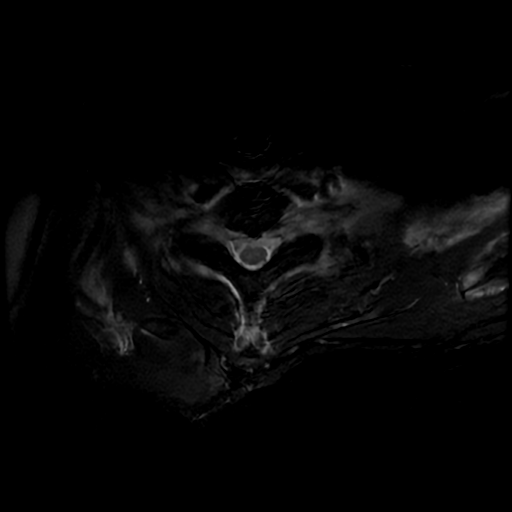
[im 10/40]
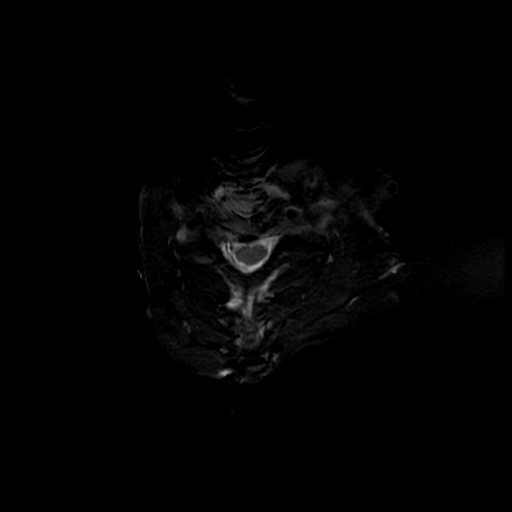
[im 15/40]
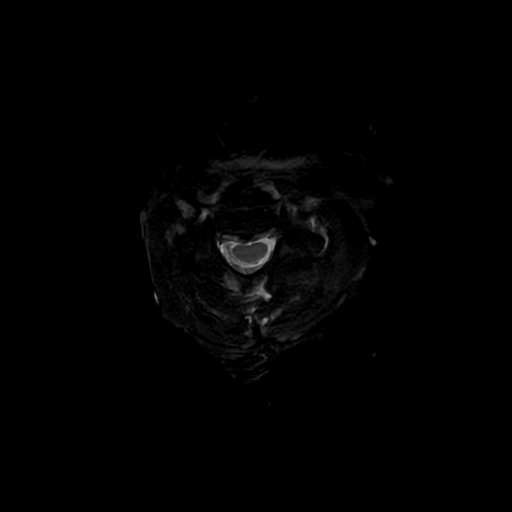
[im 20/40]
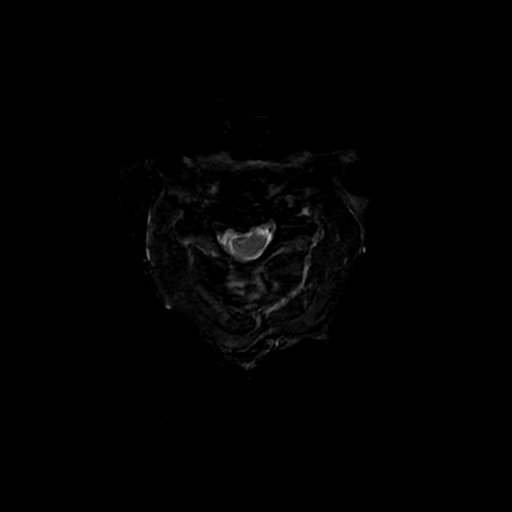
[im 25/40]
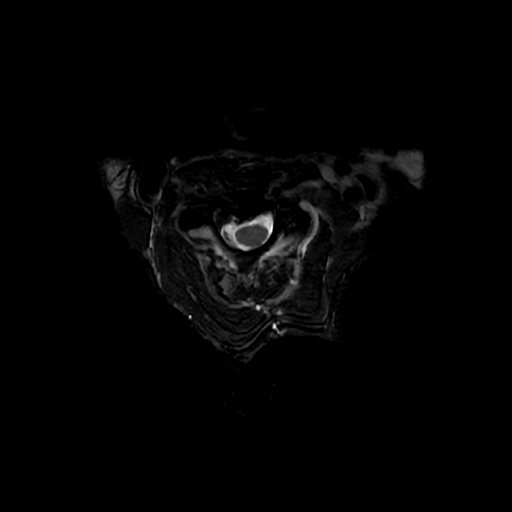
[im 30/40]
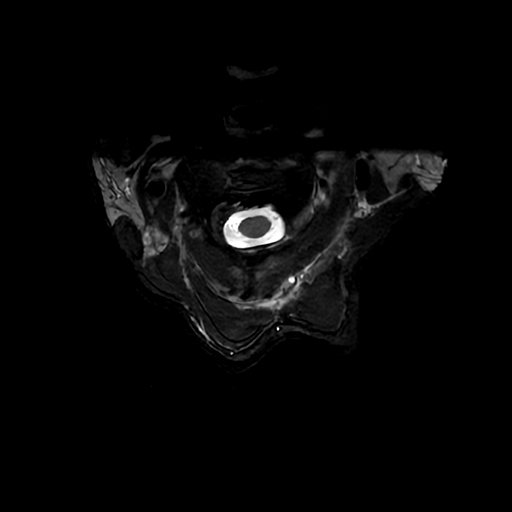
[im 35/40]
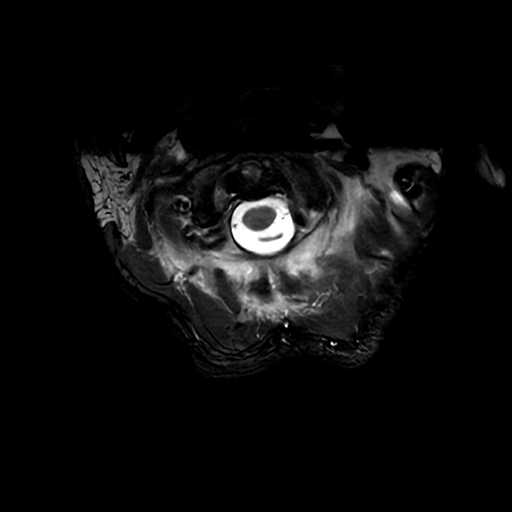
[im 40/40]
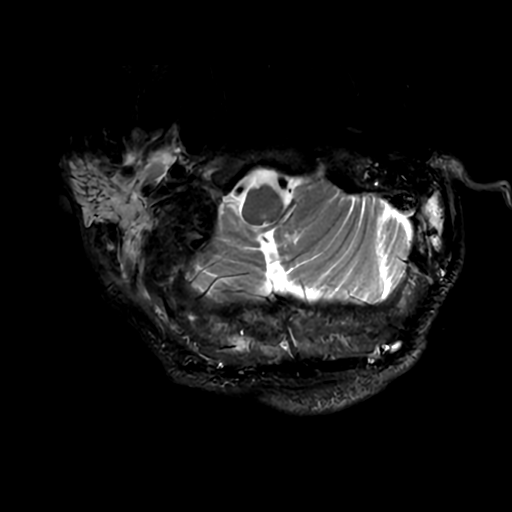

[19 of 48 positions shown; findings below may reference images not displayed]

FINDINGS: Evaluation is somewhat limited by motion artifact.

Alignment: Reversal of the normal cervical lordosis. No significant
listhesis.

Vertebrae: No acute fracture or suspicious osseous lesion.

Cord: Normal signal and morphology.

Posterior Fossa, vertebral arteries, paraspinal tissues: Lacunar
infarct in the right pons, better evaluated on the same-day MRI the
head. Cystic lesion in the posterior sella, favored to represent a
Rathke's cleft cyst.

Disc levels:

C2-C3: Mild disc bulge. No spinal canal stenosis. Facet arthropathy.
Mild right neural foraminal narrowing.

C3-C4: Mild disc bulge with small right paracentral disc protrusion.
Uncovertebral and facet arthropathy. No spinal canal stenosis. Mild
bilateral neural foraminal narrowing.

C4-C5: Mild disc bulge with small central disc protrusion, which
indents the ventral spinal cord. No spinal canal stenosis.
Uncovertebral and facet arthropathy. Mild-to-moderate bilateral
neural foraminal narrowing.

C5-C6: No definite disc bulge. No spinal canal stenosis.
Uncovertebral and facet arthropathy. Mild right neural foraminal
narrowing.

C6-C7: No significant disc bulge. No spinal canal stenosis or
neuroforaminal narrowing.

C7-T1: No significant disc bulge. No spinal canal stenosis or
neuroforaminal narrowing.
IMPRESSION: Evaluation is limited by motion artifact. Within this limitation, no
definite spinal canal stenosis and up to mild-to-moderate neural
foraminal narrowing, at C4-C5, as described above.

## 2021-04-11 IMAGING — MR MR HEAD W/O CM
7 of 10 series · 28 of 48 positions shown · non-contrast
Comparison: No prior MRI, correlation is made with [DATE] CT
head and CTA head and neck
COMPARISON: No prior MRI, correlation is made with [DATE] CT
head and CTA head and neck

Addendum:
CLINICAL DATA: Stroke suspected

EXAM:
MRI HEAD WITHOUT CONTRAST
TECHNIQUE: Multiplanar, multiecho pulse sequences of the brain and surrounding
structures were obtained without intravenous contrast.

[Series 3: DWI · axial · 3.0mm · 0.94mm/px · z∈[-130,+12]mm · 8 of 106 slices shown (1 of 2)]
[im 1/106]
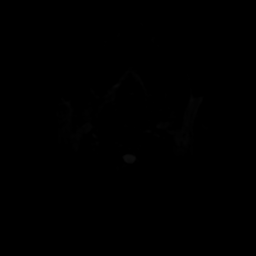
[im 12/106]
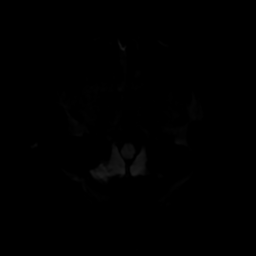
[im 36/106]
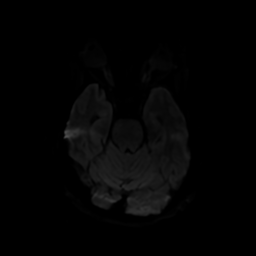
[im 47/106]
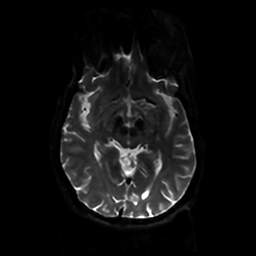
[im 59/106]
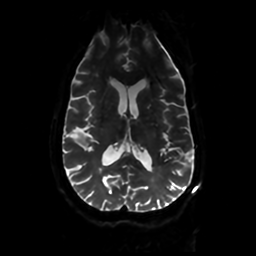
[im 71/106]
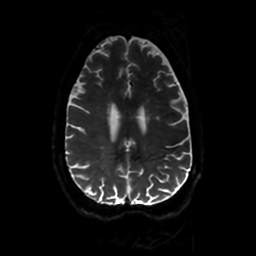
[im 94/106]
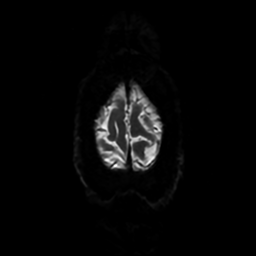
[im 106/106]
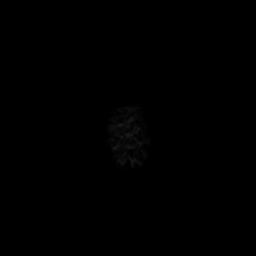

[Series 4: DWI · coronal · 4.0mm · 0.94mm/px · 7 of 78 slices shown (2 of 2)]
[im 1/78]
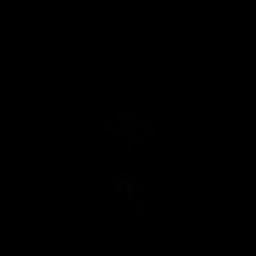
[im 13/78]
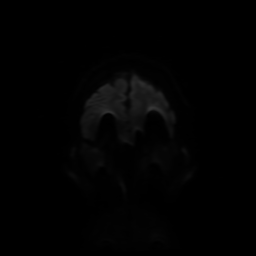
[im 26/78]
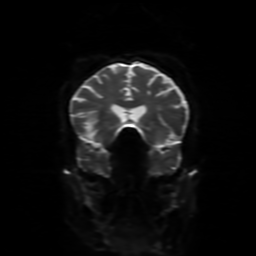
[im 39/78]
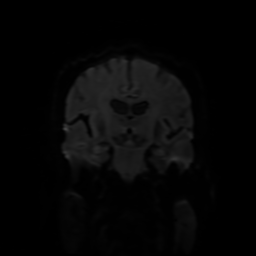
[im 52/78]
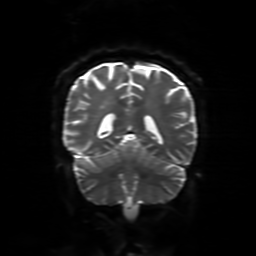
[im 65/78]
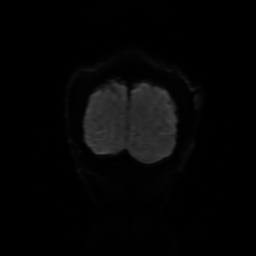
[im 78/78]
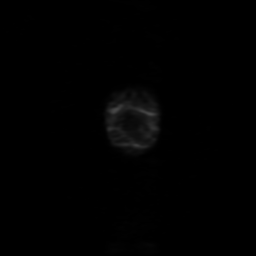

[Series 5: FLAIR · axial · 5.0mm · 0.47mm/px · z∈[-129,+11]mm · 3 of 27 slices shown (1 of 2)]
[im 1/27]
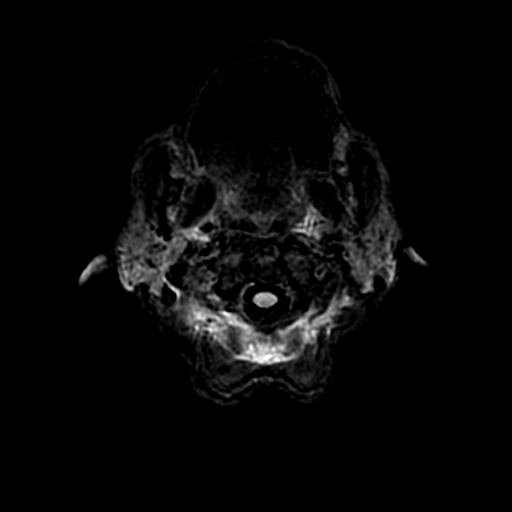
[im 14/27]
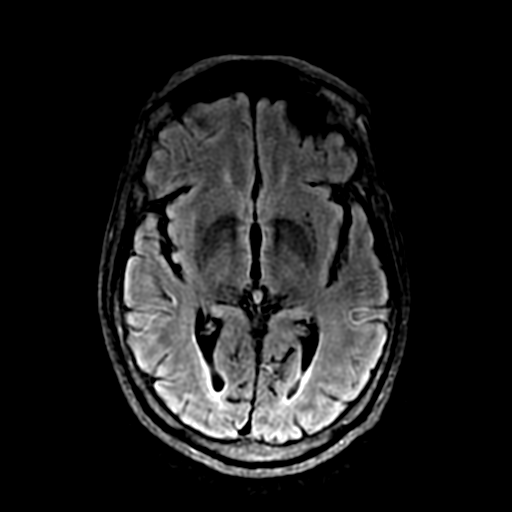
[im 27/27]
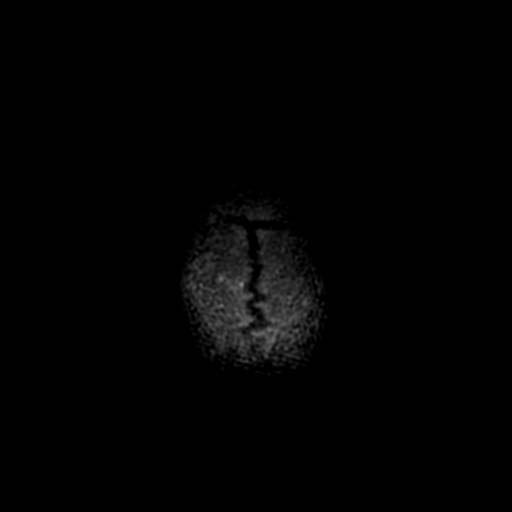

[Series 7: FLAIR · sagittal · 5.0mm · 0.23mm/px · 2 of 23 slices shown (2 of 2)]
[im 1/23]
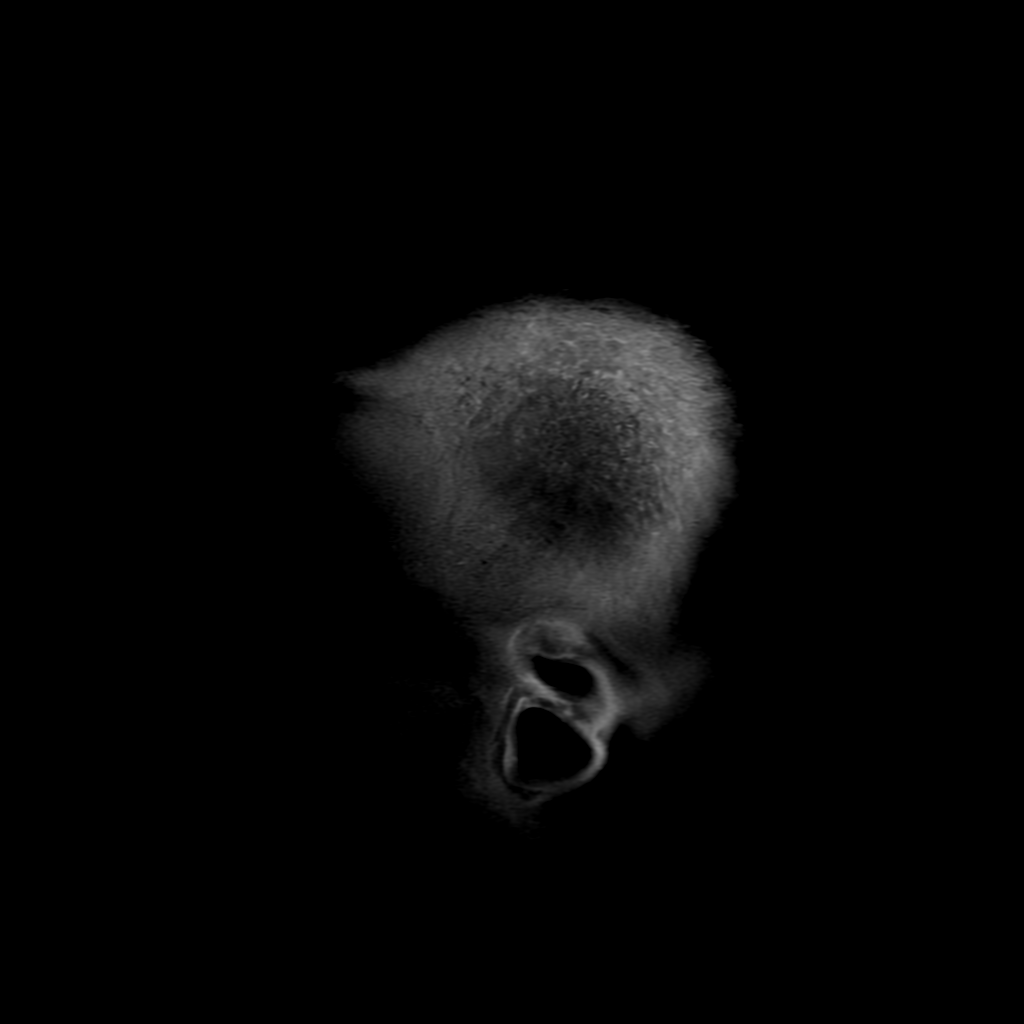
[im 23/23]
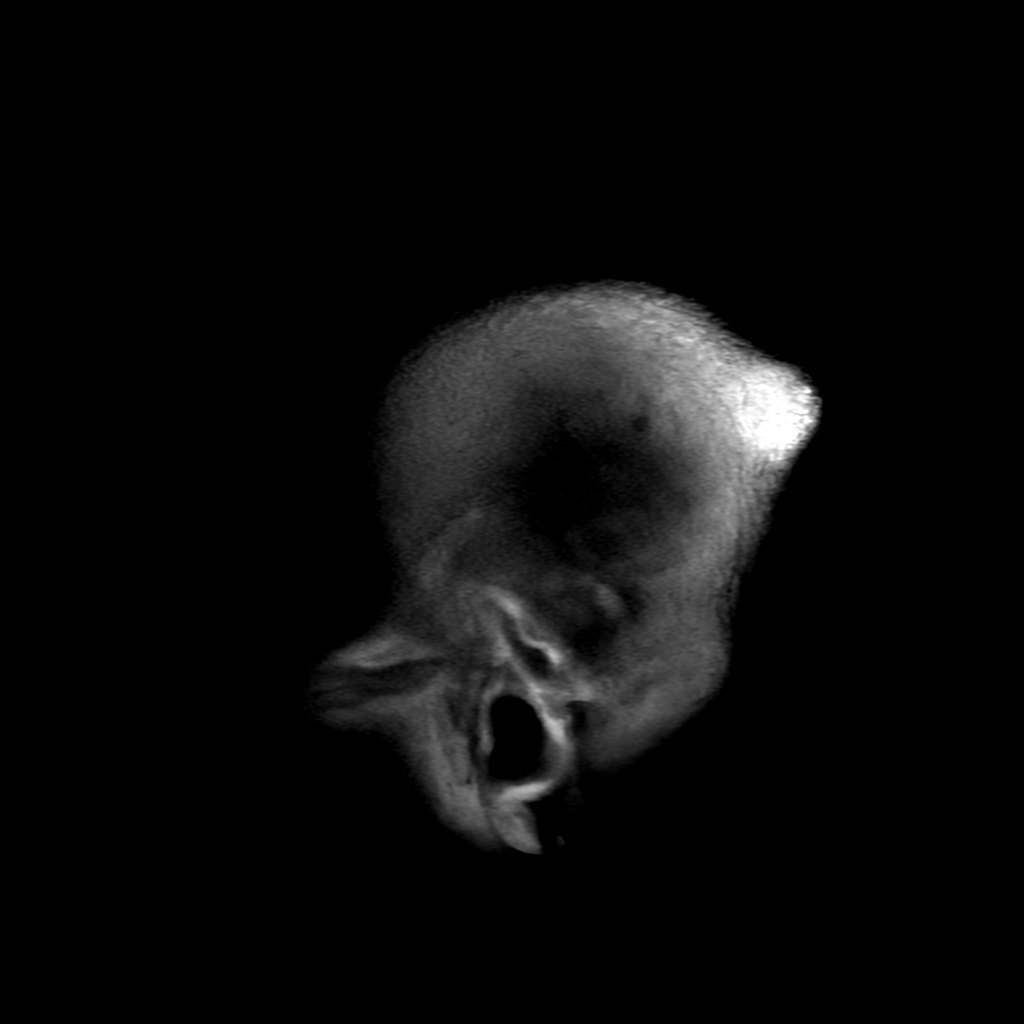

[Series 10: T2 · coronal · 5.0mm · 0.47mm/px · 1 of 33 slices shown]
[im 1/33]
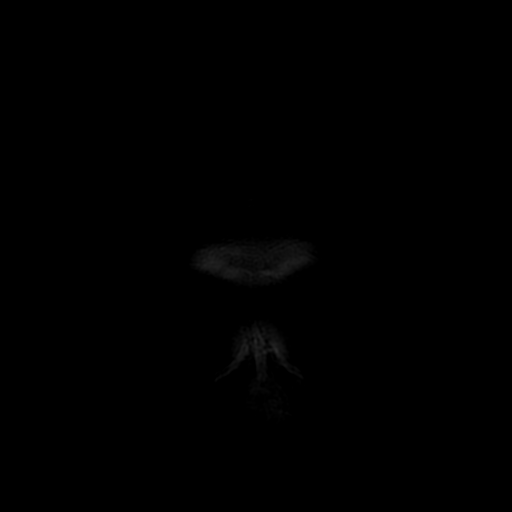

[Series 350: ADC · axial · 3.0mm · 0.94mm/px · z∈[-130,+12]mm · 4 of 47 slices shown (1 of 2)]
[im 1/47]
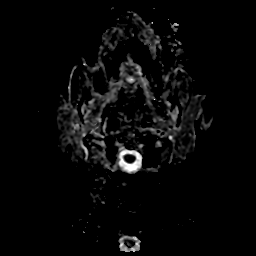
[im 16/47]
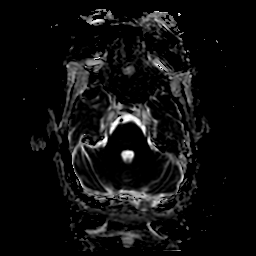
[im 31/47]
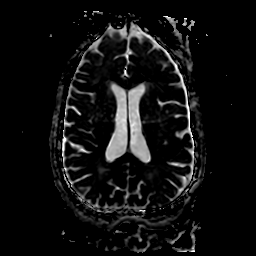
[im 47/47]
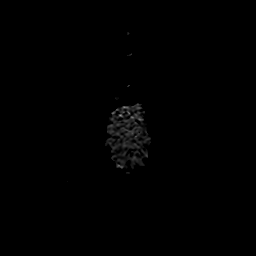

[Series 450: ADC · coronal · 4.0mm · 0.94mm/px · 3 of 36 slices shown (2 of 2)]
[im 1/36]
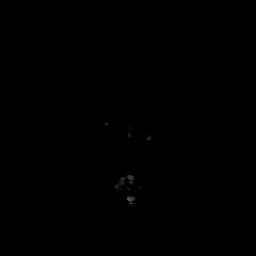
[im 18/36]
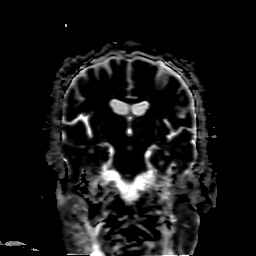
[im 36/36]
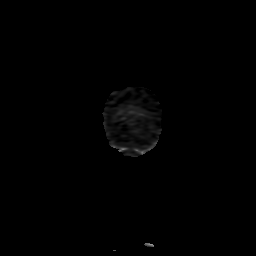

[28 of 48 positions shown; findings below may reference images not displayed]

FINDINGS: Evaluation is somewhat limited by motion artifact.

Brain: No restricted diffusion to suggest acute or subacute infarct.
No acute hemorrhage, mass, mass effect, or midline shift. No
hydrocephalus or extra-axial collection. Scattered T2 hyperintense
signal in the periventricular white matter, likely the sequela of
chronic small vessel ischemic disease. Lacunar infarct in the right
pons.

Vascular: Normal flow voids.

Skull and upper cervical spine: Normal marrow signal. Subcutaneous
lesions in the left parietal scalp (series 9, image 18), possibly
epidermal inclusion cysts.

Sinuses/Orbits: Remote left lamina papyracea fracture. The orbits
and paranasal sinuses are otherwise unremarkable.

Other: Trace fluid in right mastoid air cells.
IMPRESSION: Evaluation is limited by motion artifact. Within this limitation, no
acute intracranial process.

ADDENDUM:
Incidental note is made of a small T1 hypointense, T2 hyperintense
lesion at midline posterior to the pituitary gland in the sella,
which is somewhat better visualized on the same-day MRI of the
cervical spine. This lesion measures up to 4 x 4 x 4 mm (series 11,
image 10 and series 7, image 12). This is favored to represent a
Rathke's cleft cyst, of doubtful clinical significance.

*** End of Addendum ***
FINDINGS: Evaluation is somewhat limited by motion artifact.

Brain: No restricted diffusion to suggest acute or subacute infarct.
No acute hemorrhage, mass, mass effect, or midline shift. No
hydrocephalus or extra-axial collection. Scattered T2 hyperintense
signal in the periventricular white matter, likely the sequela of
chronic small vessel ischemic disease. Lacunar infarct in the right
pons.

Vascular: Normal flow voids.

Skull and upper cervical spine: Normal marrow signal. Subcutaneous
lesions in the left parietal scalp (series 9, image 18), possibly
epidermal inclusion cysts.

Sinuses/Orbits: Remote left lamina papyracea fracture. The orbits
and paranasal sinuses are otherwise unremarkable.

Other: Trace fluid in right mastoid air cells.
IMPRESSION: Evaluation is limited by motion artifact. Within this limitation, no
acute intracranial process.

## 2021-04-11 IMAGING — CT CT HEAD W/O CM
4 series · 16 of 47 positions shown, 18 images · non-contrast
Comparison: [DATE]

CLINICAL DATA: Concern for stroke a few days ago with dropping
things out of the patient's right hand. Also reports blunt trauma to
the face.

EXAM:
CT HEAD WITHOUT CONTRAST
CT MAXILLOFACIAL WITHOUT CONTRAST
TECHNIQUE: Multidetector CT imaging of the head and maxillofacial structures
were performed using the standard protocol without intravenous
contrast. Multiplanar CT image reconstructions of the maxillofacial
structures were also generated.
RADIATION DOSE REDUCTION: This exam was performed according to the
departmental dose-optimization program which includes automated
exposure control, adjustment of the mA and/or kV according to
patient size and/or use of iterative reconstruction technique.

[Series 3: head wo · axial · 0.45mm/px · z∈[-87,+33]mm · 7 of 33 slices shown, 9 images]
[im 5/33  brain]
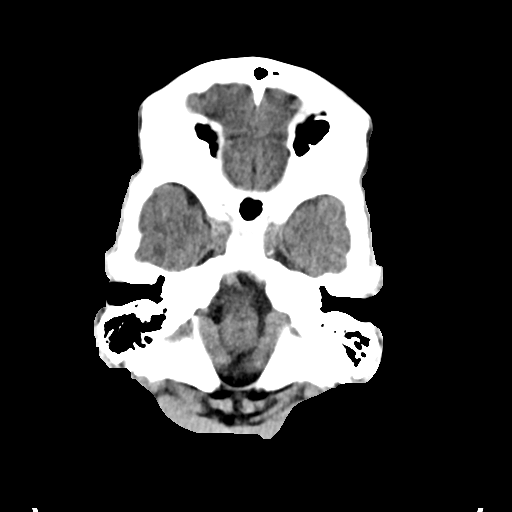
[im 5/33  bone]
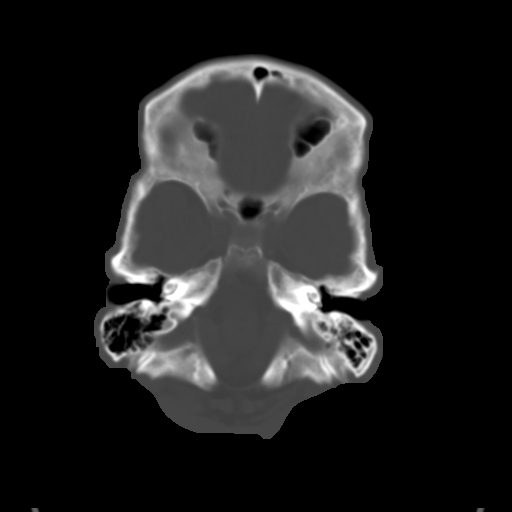
[im 9/33  brain]
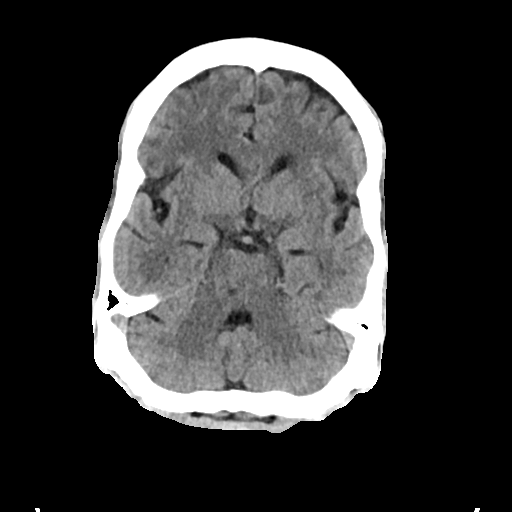
[im 13/33  brain]
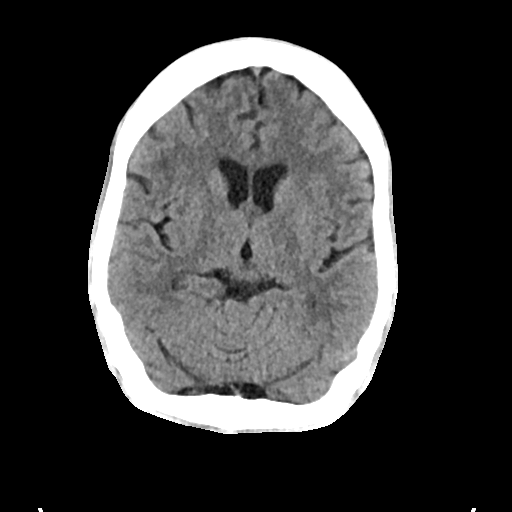
[im 17/33  brain]
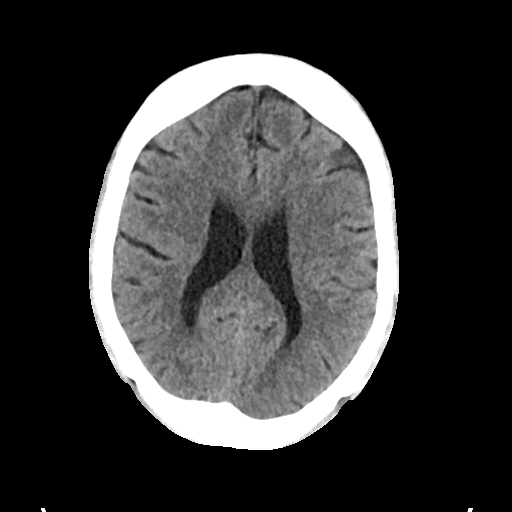
[im 21/33  brain]
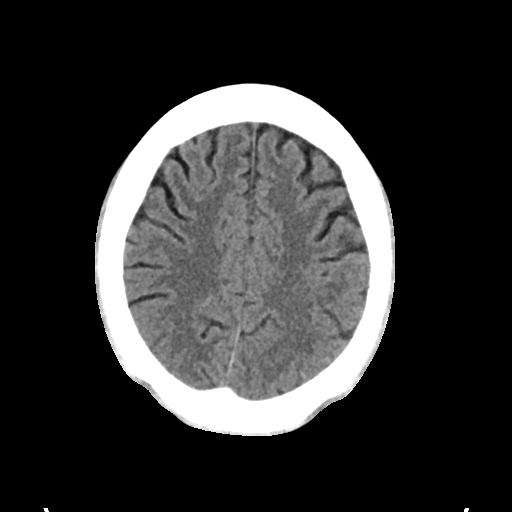
[im 21/33  bone]
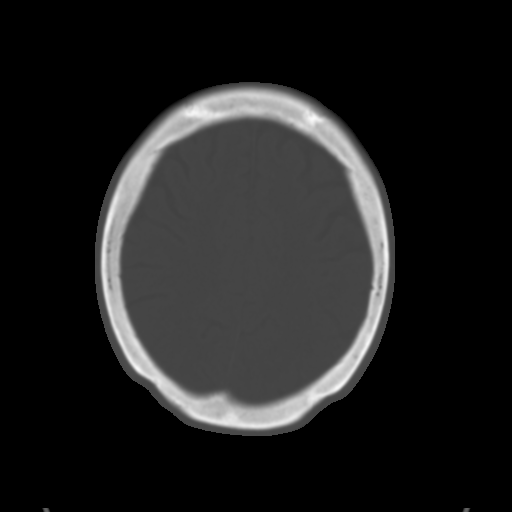
[im 25/33  brain]
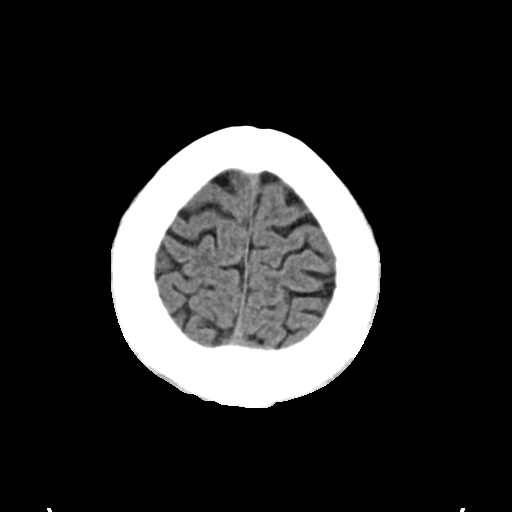
[im 29/33  brain]
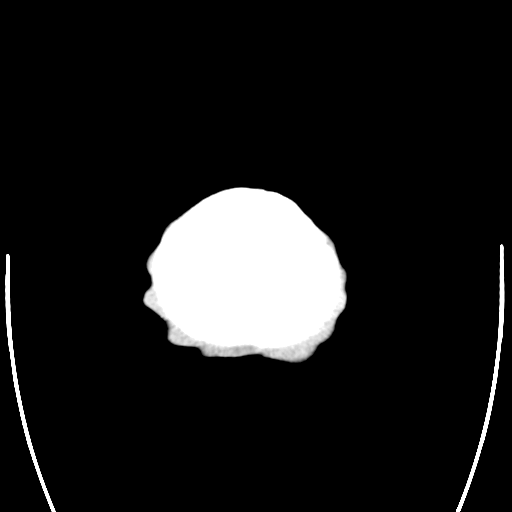

[Series 4: head bone · axial · 0.45mm/px · z∈[-91,-59]mm · 3 of 81 slices shown]
[im 9/81  bone]
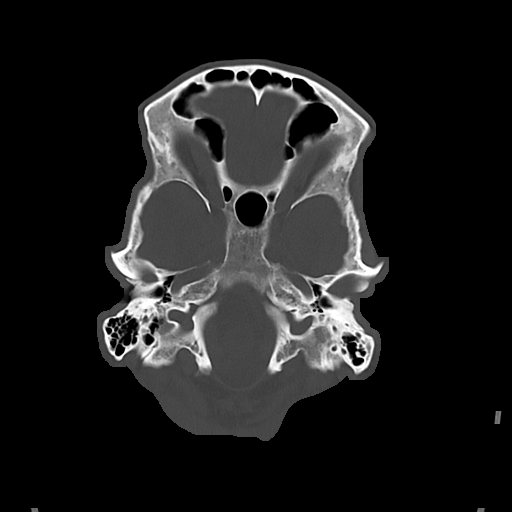
[im 17/81  bone]
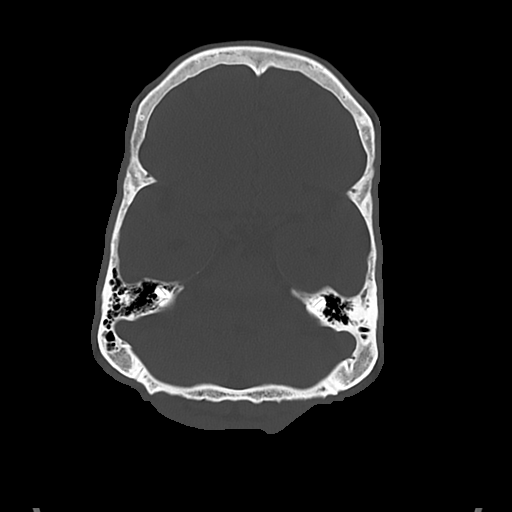
[im 25/81  bone]
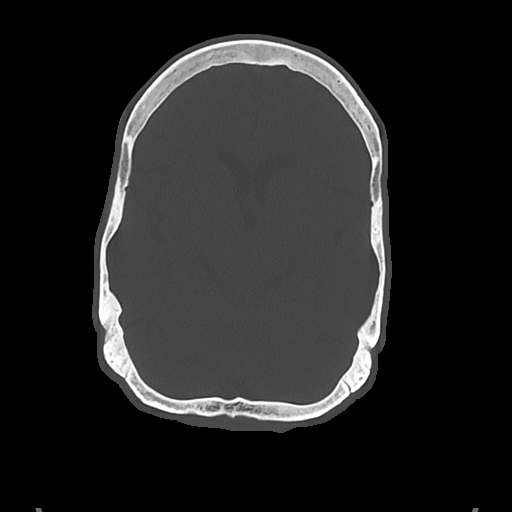

[Series 5: cor soft · coronal · 0.34mm/px · 3 of 65 slices shown]
[im 22/65  brain]
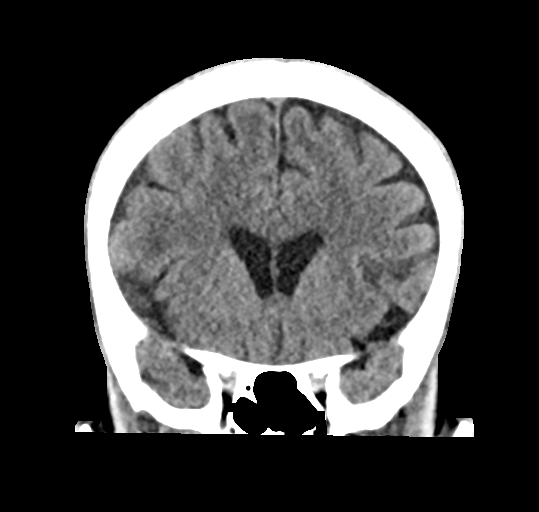
[im 29/65  brain]
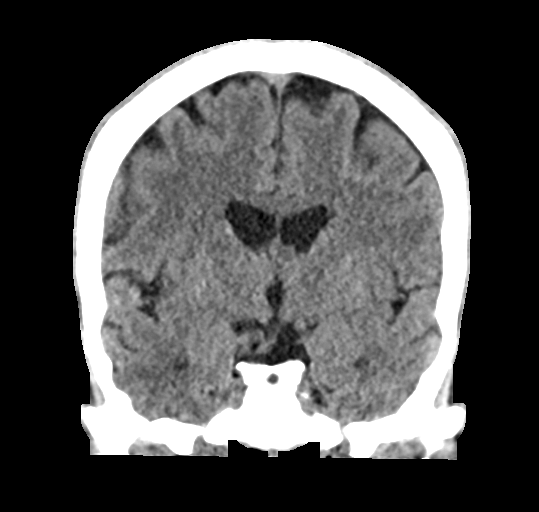
[im 36/65  brain]
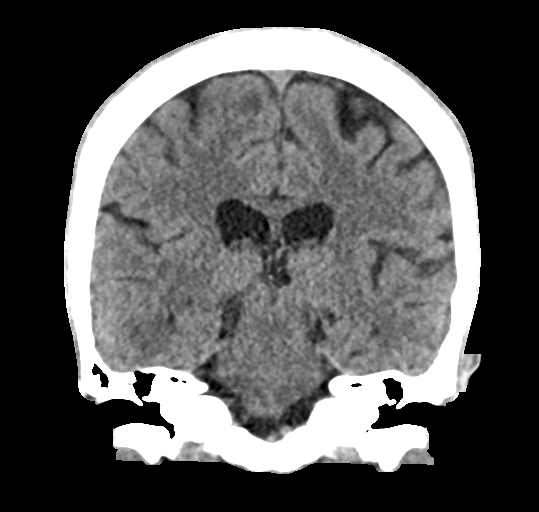

[Series 6: sag soft · sagittal · 0.34mm/px · 3 of 61 slices shown]
[im 21/61  brain]
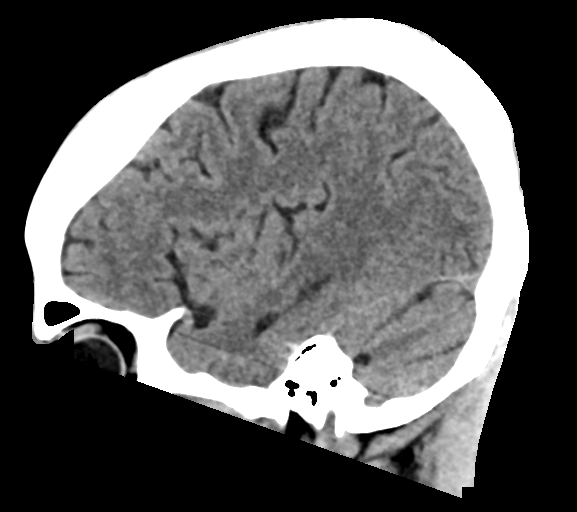
[im 31/61  brain]
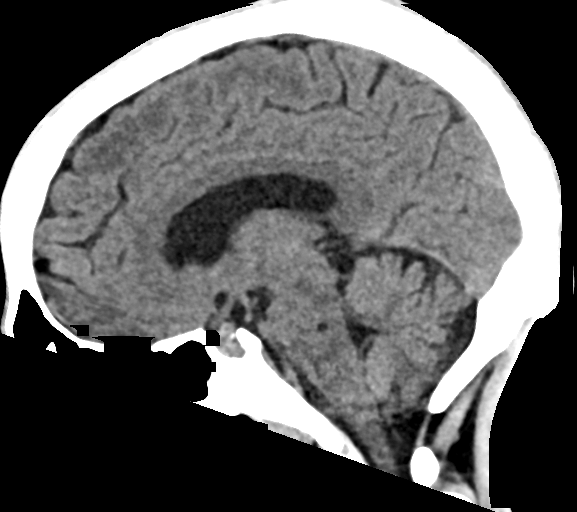
[im 41/61  brain]
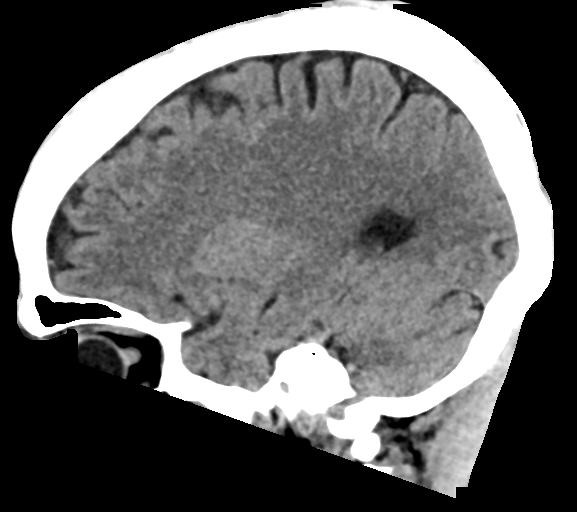

[16 of 47 positions shown; findings below may reference images not displayed]

FINDINGS: CT HEAD FINDINGS

Brain: No evidence of acute infarction, hemorrhage, hydrocephalus,
extra-axial collection or mass lesion/mass effect.

Small well-defined focus of hypoattenuation in the right posterior
pons, new from the prior CT, consistent with an old lacunar infarct.

Vascular: No hyperdense vessel or unexpected calcification.

Skull: Normal. Negative for fracture or focal lesion.

Other: None.

CT MAXILLOFACIAL FINDINGS

Osseous: No acute fracture or mandibular dislocation. No destructive
process. Old fracture of the medial left orbital wall.

Orbits: Negative. No traumatic or inflammatory finding.

Sinuses: Clear.

Soft tissues: No soft tissue contusion or hematoma.
IMPRESSION: HEAD CT

1. No acute intracranial abnormalities.
2. Small well-defined focus of hypoattenuation in the right
posterior pons consistent with an old lacunar infarct, new since
[5D].

MAXILLOFACIAL CT

1. No acute fracture or acute finding.

## 2021-04-11 MED ORDER — ACETAMINOPHEN 650 MG RE SUPP: 650.0000 mg | Freq: Four times a day (QID) | RECTAL | Status: DC | PRN

## 2021-04-11 MED ORDER — LORAZEPAM 2 MG/ML IJ SOLN: 0.5000 mg | Freq: Once | INTRAMUSCULAR | Status: DC

## 2021-04-11 MED ORDER — SENNOSIDES-DOCUSATE SODIUM 8.6-50 MG PO TABS: 1.0000 | ORAL_TABLET | Freq: Every evening | ORAL | Status: DC | PRN

## 2021-04-11 MED ORDER — ASPIRIN 81 MG PO CHEW
81.0000 mg | CHEWABLE_TABLET | Freq: Every day | ORAL | Status: DC
  Administered 2021-04-12 – 2021-04-14 (×3): 81 mg via ORAL
  Filled 2021-04-11 (×2): qty 1

## 2021-04-11 MED ORDER — IOHEXOL 350 MG/ML SOLN
100.0000 mL | Freq: Once | INTRAVENOUS | Status: AC | PRN
  Administered 2021-04-11: 100 mL via ORAL

## 2021-04-11 MED ORDER — LORAZEPAM 2 MG/ML IJ SOLN
1.0000 mg | Freq: Once | INTRAMUSCULAR | Status: AC
  Administered 2021-04-11: 1 mg via INTRAVENOUS
  Filled 2021-04-11: qty 1

## 2021-04-11 MED ORDER — ACETAMINOPHEN 325 MG PO TABS
650.0000 mg | ORAL_TABLET | Freq: Four times a day (QID) | ORAL | Status: DC | PRN
  Administered 2021-04-13: 650 mg via ORAL
  Filled 2021-04-11: qty 2

## 2021-04-11 NOTE — TOC Initial Note (Signed)
Transition of Care (TOC) - Initial/Assessment Note  ? ? ?Patient Details  ?Name: Logan Nelson ?MRN: [id number] ?Date of Birth: 19-Jan-1966 ? ?Transition of Care (TOC) CM/SW Contact:    ?Lockie Pares, RN ?Phone Number: ?04/11/2021, 5:10 PM ? ?Clinical Narrative:                 ? ?Transition of Care Department [facility]) has reviewed patient and no TOC needs have been identified at this time. We will continue to monitor patient advancement through interdisciplinary progression rounds. If new patient transition needs arise, please place a TOC consult. ?  ?  ?  ?  ? ? ?Patient Goals and CMS Choice ?  ?  ?  ? ?Expected Discharge Plan and Services ?  ?  ?  ?  ?  ?                ?  ?  ?  ?  ?  ?  ?  ?  ?  ?  ? ?Prior Living Arrangements/Services ?  ?  ?  ?       ?  ?  ?  ?  ? ?Activities of Daily Living ?  ?  ? ?Permission Sought/Granted ?  ?  ?   ?   ?   ?   ? ?Emotional Assessment ?  ?  ?  ?  ?  ?  ? ?Admission diagnosis:  Stroke [facility]) [I63.9] ?Patient Active Problem List  ? Diagnosis Date Noted  ? Stroke [facility]) 04/11/2021  ? Small bowel obstruction (HCC) 10/13/2020  ? Anemia due to vitamin B12 deficiency 10/13/2020  ? Hypertension 01/09/2015  ? 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 ?PCP:  [name], MD ?Pharmacy:   ?[id number] - [place], [place] - [address] ?[address] ?[place] [address] ?Phone: [phone] Fax: [phone] ? ? ? ? ?Social Determinants of Health (SDOH) Interventions ?  ? ?Readmission Risk Interventions ?No flowsheet data found. ? ? ?

## 2021-04-11 NOTE — ED Triage Notes (Signed)
Pt states, "I think I had a stroke a few days ago.  I'm dropping stuff out of my R hand.  I went to [employer] and they couldn't figure out what is wrong with me."  Asked pt to raise arms to assess drift and he states his arm hurts too bad to raise it.  Reports pain all over.  C/o SOB and bilateral ankle swelling.  Abd distended.  Asked pt if it was distended more than usual and he says he doesn't know and that he is supposed to have surgery on his ulcers. ?

## 2021-04-11 NOTE — ED Provider Notes (Signed)
[name] EMERGENCY DEPARTMENT Provider Note  History   Chief Complaint  Patient presents with   Shortness of Breath   Leg Swelling   Logan Nelson is a 56 y.o. male w/ h/o chronic abdominal pain with ventral wall hernias, HTN who p/w right-sided weakness x 2 days.   The history is provided by the patient.  Illness Location:  RUE Quality:  Weakness Severity:  Unable to specify Onset quality:  Sudden Duration:  2 days Timing:  Constant Progression:  Unchanged Chronicity:  New Context:  See MDM Associated symptoms: abdominal pain (chronic), nausea (chronic) and vomiting (chronic)   Associated symptoms: no chest pain, no congestion, no diarrhea, no fever, no rash, no shortness of breath and no sore throat     Past Medical History:  Diagnosis Date   Anemia    Arthritis    Bradycardia    Dyspnea    Falling episodes    GERD (gastroesophageal reflux disease)    Hernia of abdominal wall    Hypertension    Lower extremity edema     Social History   Tobacco Use   Smoking status: Former    Types: Cigarettes    Quit date: 04/28/2020    Years since quitting: 0.9   Smokeless tobacco: Never  Vaping Use   Vaping Use: Never used  Substance Use Topics   Alcohol use: Not Currently   Drug use: Not Currently     No family history on file.  Review of Systems  Constitutional:  Negative for chills and fever.  HENT:  Negative for congestion and sore throat.   Eyes: Negative.   Respiratory:  Negative for chest tightness and shortness of breath.   Cardiovascular:  Negative for chest pain and palpitations.  Gastrointestinal:  Positive for abdominal pain (chronic), nausea (chronic) and vomiting (chronic). Negative for blood in stool and diarrhea.  Endocrine: Negative.   Genitourinary:  Negative for dysuria, flank pain, hematuria and testicular pain.  Musculoskeletal:  Negative for neck pain and neck stiffness.  Skin:  Negative for rash and wound.   Allergic/Immunologic: Negative.   Neurological:  Positive for weakness. Negative for dizziness, seizures, syncope and speech difficulty.  Hematological: Negative.   Psychiatric/Behavioral: Negative.      Physical Exam   Today's Vitals   04/11/21 1345 04/11/21 1400 04/11/21 1415 04/11/21 1430  BP: 110/88 115/84 (!) 116/91 107/81  Pulse: 65 61 64 64  Resp: 17 16 17 17   Temp:      SpO2: 98% 100% 99% 99%  PainSc:         Physical Exam Vitals reviewed.  Constitutional:      General: He is not in acute distress.    Comments: Chronically ill appearing  HENT:     Head: Normocephalic and atraumatic.     Nose: Nose normal. No congestion.     Mouth/Throat:     Mouth: Mucous membranes are moist.     Pharynx: Oropharynx is clear. No oropharyngeal exudate.  Eyes:     Extraocular Movements: Extraocular movements intact.     Pupils: Pupils are equal, round, and reactive to light.  Cardiovascular:     Rate and Rhythm: Normal rate and regular rhythm.     Pulses: Normal pulses.     Heart sounds: Normal heart sounds. No murmur heard. Pulmonary:     Effort: Pulmonary effort is normal. No respiratory distress.     Breath sounds: Normal breath sounds. No stridor. No wheezing, rhonchi or rales.  Abdominal:     Palpations: Abdomen is soft.     Tenderness: There is no abdominal tenderness. There is no guarding or rebound.     Hernia: A hernia (reproducible) is present.  Musculoskeletal:        General: Normal range of motion.     Cervical back: Normal range of motion and neck supple.     Right lower leg: Edema present.     Left lower leg: Edema present.     Comments: LLE>RLE swelling  Skin:    General: Skin is warm and dry.     Capillary Refill: Capillary refill takes less than 2 seconds.     Findings: No rash.  Neurological:     Mental Status: He is alert and oriented to person, place, and time.     GCS: GCS eye subscore is 4. GCS verbal subscore is 5. GCS motor subscore is 6.      Cranial Nerves: Cranial nerves 2-12 are intact. No dysarthria or facial asymmetry.     Sensory: Sensory deficit (RUE) present.     Motor: Weakness ((RUE, RLE)) present. No abnormal muscle tone.     Coordination: Coordination abnormal. Finger-Nose-Finger Test abnormal (RUE).  Psychiatric:        Mood and Affect: Mood normal.        Behavior: Behavior normal.    ED Course  Procedures  Medical Decision Making:  Logan Nelson is a 56 y.o. male w/ h/o chronic abdominal pain with ventral wall hernias, HTN who p/w right-sided weakness x 2 days.   No history of stroke Not on anticoagulation Noted that he is having difficulty with fine motor skills in his right upper extremity, worsening leg swelling with associated leg weakness (worse on the right) No preceding trauma.  However 2 days ago, the patient was ambulating when his "legs gave out from underneath him" and he fell face first onto the pavement, he did not lose consciousness and was not evaluated after this fall. Patient reports he has residual pain along his maxilla Presents today reporting "I am concerned that I had a stroke" On 2/20, he was evaluated for bilateral lower extremity swelling with a negative DVT study at this time Denies shortness of breath, denies chest pain.  BP 117/86, HR 66, RR 13, SPO2 100% on room air Has chronic abdominal pain with ventral hernias, reports scheduled surgery on 3/15 for repair, denies diarrhea, last BM yesterday, denies dysuria, endorses intermittent chronic emesis (no change from prior)  On examination: Dysmetria on RUE finger-nose, decreased strength in RUE and RLE, no cranial nerve abnormalities appreciable, decreased sensation in RUE.  LLE swelling >RLE.  The patient was protecting/maintaining their own airway.  The patient was not a candidate for thrombolytics. Blood pressure was well-controlled. Antihypertensives were not required.  ER provider interpretation of Imaging / Radiology:   CXR: Without acute cardiopulmonary abnormality CT head: No acute intracranial abnormality, old lacunar infarct in right posterior pons CT face: No acute fracture or acute finding CTA head/neck: No intracranial large vessel occlusion or proximal high-grade arterial stenosis  ER provider interpretation of EKG:  Normal sinus rhythm, left anterior fascicular block (known)  ER provider interpretation of Labs:  CBC: No leukocytosis, no acute anemia BMP: K3.2, BG 102, no AKI HFP: No change in AST/ALT compared to 12 days ago Coags: PT 16.3, INR 1.3, aPTT 36 Trop: 13  Key medications administered in the ER:  Medications  iohexol (OMNIPAQUE) 350 MG/ML injection 100 mL (100 mLs Oral  Contrast Given 04/11/21 1233)    Diagnoses considered:  Initial c/f stroke, CT w/ old lacunar infarct in R pons which does not correlate w/ exam. Admit for TIA w/up.   Stroke mimics:  Unlikely seizures / postictal paralysis (no sz-like activity preceeding, no h/o sz) Unlikely syncope (as has persistent  or associated neuro sxs) Unlikely meningitis / encephalitis (no fever, no meningismus) Unlikely complicated migraine (no h/o similar episodes, no preceding aura, no HA) Unlikely brain neoplasm/abscess (no signs of infection, not c/w CTH) Unlikely epidural / subdural hematoma (not c/w CTH) Unlikely SAH (no sudden onset worst HA of life) Unlikely hypoglycemia (no h/o DM, FSBS 102) Unlikely hyponatremia (no h/o diuretic use, no excessive free water intake, Na 140) Unlikely HTN encephalopathy (no global cerebral dysfunction, not gradual onset, BP 110/88) Unlikely Wernicke's encephalopathy (no h/o alcoholism or malnutrition, no triad of ataxia / ophthalmoplegia / confusion) Unlikely labyrinthitis (not predominately vestibular symptoms, has other focal findings) Unlikely Bell's palsy (no isolated peripheral 7th nerve palsy) Unlikely Meniere's disease (no h/o recurrent episodes dominated by vertigo sxs, tinnitus,  deafness) Unlikely demyelinating disease such as MS (no gradual onset, no h/o multiple episodes of neuro findings in multifocal anatomic distributions)  Consulted: None thus far, will likely require neuro consultation after MRI  CT findings not c/w exam, will admit for TIA w/up in setting of persistent neuro findings.   Admit for stroke work-up  Patient seen in conjunction with Dr. Duayne Cal medical dictation software was used in the creation of this note.   Electronically signed by: Wynetta Fines, MD on 04/11/2021 at 2:54 PM  Clinical Impression:  1. Dysmetria     Dispo: Imagene Riches, MD 04/12/21 Sanjuan Dame, MD 04/13/21 [id number]

## 2021-04-11 NOTE — Consult Note (Signed)
Neurology Consult   Logan Nelson MR# [id number] 04/11/2021   CC: 56 year old AA male presented to [facility] ED with c/o right sided weakness and pain as well as severe dysmetria or impairment to extremity coordination. He fell face first two days ago due to the dysmetria. He said he thinks he had a stroke a few days ago.   History is obtained from: patient, nurse and chart.  HPI: Logan Nelson is a 56 y.o. male PMHx of hypertension, abdominal wall hernias, GERD, and B12 deficiency. Prior history of stroke in 2005. Presented to ED with right sided weakness for a few days with concern for stroke. He describes having a hard time walking and keeping his BLE coordinated these past few days and attributes his BLE weakness to his pain.   Per H&P: Both of his lower extremities are edematous and he is unsure how long they have been swollen for, although he notes that this makes him experience a lot of pain. His left leg is more swollen than the right. He denies any history of blood clots that he knows of, any recent surgeries or travel. He also states that his right arm has been weak for the past 2 days. He has been dropping things out of his right hand and can't keep his arm raised. He could not even pick up a milk carton and states that he was dropping his keys, as well. He also notes that he fell 2 days ago because of his weakness and felt like his legs gave out from underneath him. He states that he fell face first onto the pavement and did not lose consciousness at that time.    The patient denies any fevers, chills, blurry vision, dizziness, chest pain, palpitations, shortness of breath, vomiting, diarrhea, constipation, melena, hematochezia, or any urinary symptoms at this time. He does endorse chronic abdominal pain and nausea secondary to his known abdominal wall hernia. The patient's appetite has been stable and he has had no difficulties swallowing.     LKW: 3 or more days ago tNK  given: No outside of window IR Thrombectomy No, not indicated Modified Rankin Scale: 2-Slight disability-UNABLE to perform all activities but does not need assistance NIHSS:8 Level of Consciousness (1a.)   : Alert, keenly responsive (03/05 1123) LOC Questions (1b. )   +: Answers both questions correctly (03/05 1123) LOC Commands (1c. )   + : Performs both tasks correctly (03/05 1123) Best Gaze (2. )  +: Normal (03/05 1123) Visual (3. )  +: No visual loss (03/05 1123) Facial Palsy (4. )    : Normal symmetrical movements (03/05 1123) Motor Arm, Left (5a. )   +: No drift (03/05 1123) Motor Arm, Right (5b. )   +: Some effort against gravity (03/05 1123) Motor Leg, Left (6a. )   +: No effort against gravity (03/05 1123) Motor Leg, Right (6b. )   +: No effort against gravity (03/05 1123) Limb Ataxia (7. ): Absent (03/05 1123) Sensory (8. )   +: Normal, no sensory loss (03/05 1123) Best Language (9. )   +: No aphasia (03/05 1123) Dysarthria (10. ): Normal (03/05 1123) Extinction/Inattention (11.)   +: No Abnormality (03/05 1123) Modified SS Total  +: 8 (03/05 1123) Complete NIHSS TOTAL: 8 (03/05 1123)  ROS: A complete ROS was performed and is negative except as noted in the HPI.   Past Medical History:  Diagnosis Date   Anemia    Arthritis  Bradycardia    Dyspnea    Falling episodes    GERD (gastroesophageal reflux disease)    Hernia of abdominal wall    Hypertension    Lower extremity edema      No family history on file.  Social History:  reports that he quit smoking about a year ago. His smoking use included cigarettes. He has never used smokeless tobacco. He reports that he does not currently use alcohol. He reports that he does not currently use drugs.   Prior to Admission medications   Medication Sig Start Date End Date Taking? Authorizing Provider  amLODipine (NORVASC) 2.5 MG tablet Take 1 tablet (2.5 mg total) by mouth daily. Patient not taking: Reported on 04/06/2021  11/04/20 11/04/21  [name], MD  cyanocobalamin (,VITAMIN B-12,) 1000 MCG/ML injection Inject 1 mL Once a week for 4 weeks, then once a month thereafter. Patient not taking: Reported on 04/06/2021 11/04/20   [name], MD  ondansetron (ZOFRAN-ODT) 8 MG disintegrating tablet Take 1 tablet (8 mg total) by mouth every 8 (eight) hours as needed for nausea or vomiting. Patient not taking: Reported on 04/06/2021 03/14/21   Lacretia Leigh, MD  traMADol (ULTRAM) 50 MG tablet Take 1 tablet (50 mg total) by mouth every 6 (six) hours as needed. Patient not taking: Reported on 04/06/2021 03/14/21   Lacretia Leigh, MD    Exam: Current vital signs: BP 108/87    Pulse 86    Temp 98.9 F (37.2 C)    Resp 19    SpO2 99%   Physical Exam  Constitutional: Appears thin, malnourished, unkempt Psych: sedated Eyes: No scleral injection HENT: Very poor dentition with several missing teeth Head: Normocephalic.  Cardiovascular: Normal rate and regular rhythm.  Respiratory: Effort normal, symmetric excursions bilaterally, no audible wheezing. GI/GU: Soft.  Distension. There is no tenderness. Hernia noted. It is also of note, during CT abdomen contrast was still seen Skin: WDI  Neuro: Mental Status: Patient is sedated at this time. He will wake with vigorous noxious stimuli, yell "what?"and soon fall back asleep.Received 1mg  IV ativan for MRI at 1613 Patient is unable to give a clear and coherent history.Due to sedation Speech is fluent, intact comprehension when he awakes and speaks Pupils are equal, round, and reactive to light. EOMI without ptosis or diploplia.  Facial movement is symmetric.  Hearing is intact to voice when he is not sedated Uvula midline and palate elevates symmetrically. Shoulder shrug is not symmetric. Tongue is midline without atrophy or fasciculations.  5/5 strength was not present in all four extremities.Right sided weakness noted. Unable to test sensation as he would fall asleep  and not answer me when I tested his extremities.  Toes are downgoing bilaterally. he would not perform FNF/HKS for me, however nurse at bedside endorses dysmetria right side.  Gait - Deferred  I have reviewed labs in epic and the pertinent results are: Potassium- 3.2, calcium 8.1, H&H-11.6/35.7, PT-16.3, INR-1.3, protein-5.7,   I have reviewed the images obtained: NCT head showed No acute intracranial abnormalities.Small well-defined focus of hypoattenuation in the right posterior pons consistent with an old lacunar infarct, new since 2005. Maxillofacial CT showed no fractures or acute findings CTA head and neck showed The common carotid, internal carotid and vertebral arteries are patent within the neck without stenosis.No intracranial large vessel occlusion or proximal high-grade arterial stenosis.    Assessment: Logan Nelson is a 56 y.o. male PMHx hypertension, abdominal wall hernias, GERD, and B12 deficiency. Prior history  of stroke. Chief complaint is a few days of pain and weakness and dysmetria on the right side. Patient's with prior lacunar infarct that is new on imaging since 2005.   Recommended aspirin 324mg  now.  Impression:  Concern for new stroke versus reactivation of old stroke  Plan: - MRI brain without contrast. - Recommend TTE. - Recommend labs: HbA1c, lipid panel, TSH. - Recommend Statin if LDL > 70 - Aspirin 81mg  daily. - Clopidogrel 75mg  daily for 3 weeks. - SBP goal <normotension. - Telemetry monitoring for arrhythmia. - Recommend bedside Swallow screen. - Recommend Stroke education. - Recommend PT/OT/SLP consult.  This patient is critically ill and at significant risk of neurological worsening, death and care requires constant monitoring of vital signs, hemodynamics,respiratory and cardiac monitoring, neurological assessment, discussion with family, other specialists and medical decision making of high complexity. I spent 50 minutes of neurocritical  care time  in the care of  this patient.   Electronically signed by:  Parke Poisson, Neuro NP  Available via Epic  04/11/2021, 4:58 PM  Attending Attestation:  Patient seen, examined, labs,vitals and notes reviewed. Discussed plan with Parke Poisson, NP and agree with assessment and plan as documented above. I have independently reviewed the chart, obtained history, review of systems and examined the patient.  Electronically signed by:  Lynnae Sandhoff, MD Page: [phone] 04/12/2021, 5:18 AM   If 7pm- 7am, please page neurology on call as listed in [place].

## 2021-04-11 NOTE — ED Notes (Addendum)
Pt said he was claustrophobic and RN requested medication to help pt with MRI. RN administered medication prior to transport. Pt went to MRI and was unable to complete MRI due to not being able to answer orientation questions and resting with his eyes closed. RN went to MRI, sternal rubbed pt chest and pt woke up. Pt was able to tell RN his name, DOB, where he was at and what year it is. RN transported pt back to room to monitor. Admitting MD made aware. MRI rescheduled for a later time. BP 108/87, HR 86, RR 19, O2 99% on RA. Pt resting comfortably in bed, rise and fall of pt chest noted.  ?

## 2021-04-11 NOTE — Hospital Course (Addendum)
#Right upper extremity weakness, stroke rule out ?#Severe malnutrition ?#60 lb unintentional weight loss ?#Large umbilical hernia, rule out incarcerated hernia ?#Right inguinal hernia ?Patient presented with 2 days of RUE weakness, as well as generalized lower extremity weakness. On initial neuro exam, the patient has 2-3/5 RUE strength and has abnormal finger to nose testing, concerning for an infarction. CT Head with no acute intracranial abnormalities but did not a small hypoattenuation in the R posterior pons consistent with an old lacunar infarct, although patient does not remember ever being told he had a stroke. 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 MRI cervical spine showed moderate neural foraminal narrowing at multiple levels, although this is likely not the cause of the patient's weakness. Weakness is more likely related to the patient's overall severe malnutrition. A1c 4.0, TSH wnl, and LDL 36- no need for statin therapy initiation at this time. Vitamin D level was very low at 6 and the patient was started on vitamin D 50,000U weekly. He is recommended to drink Ensure Enlive po TID and Mighty Shake TID with meals. Additionally, patient has unintentionally lost ~60 lbs since September. Patient was ~200 lbs in September and is now weighing about 140 lbs. Patient also has 2 large hernias that have caused him severe nausea (which could be contributing to his weight loss) with surgery scheduled on 3/15, however, with his worsening abdominal pain today and continued nausea, we asked general surgery to evaluate this patient. They have noted that with his smoking history and drug use, as well as his poor nutritional  state, that he would not be a good candidate for surgery and does not require it emergently. Patient was strongly recommended to get a CT Chest/Abd/Pelvis to evaluate his hernias further (and to rule out incarcerated hernia), as well as to evaluate for some underlying malignancy that could be contributing to his weight loss, however, he refused this. He adamantly wanted to be discharged to home without the CT scans, although we recommended he stay to get them and could be discharged after completion of the scans. Will recommend he continue the workup for unintentional weight loss as an outpatient. He will likely need a CT chest/abd/pelvis and an outpatient EGD/colonoscopy. CT chest should be obtained to rule out lung cancer, given his history of tobacco use. PSA was within normal limits, making prostate cancer less likely for him.  ? ?#Hx of hypertension ?Previously on amlodipine 2.5 mg daily, however, patient states that someone took him off of this medicine and he does not know why. BP normotensive off medications.   ? ?#Bilateral lower extremity edema L>R ?Patient with bilateral lower extremity edema, worse on the left than the right. The patient states that this has been causing pain in his legs and has given him trouble ambulating. He is unsure when the swelling started. He denies any hx of blood clots that he knows of, any recent surgeries, or any recent travel. Unlikely to be related to heart failure as the patient had a normal echo in December. Bilateral LE ultrasound ruled out DVT. Suspect that LE edema is secondary to hypoalbuminemia.  ? ?#Transaminitis ?#Hepatic steatosis ?#Hypoalbuminemia ?Patient with AST/ALT elevation, although stable. This morning, AST/ALT 72/91 and albumin level low at  2.3. HIV and HCV Abs negative. INR slightly elevated at 1.4. Hypoalbumina is likely the cause of the patient's LE edema and DVTs were ruled out with ultrasounds.  ?Ferritin level is elevated at 763, raising concern for  possible hemochromatosis. Will need to evaluate this further in the outpatient setting. ? ?#Hx of B12 deficiency ?Patient previously treated with weekly B12 injections.  ? ?#Large umbilical hernia ?#Large right inguinal hernia ?Patient has followed with [facility] for his hernias. He has had significant abdominal pain and nausea. Plan for open incisional hernia repair and right inguinal hernia repair with mesh later this month, on 3/15. Patient will need to follow up with [facility] regarding this, as he was noted to not be a candidate for surgery given his poor nutritional status.  ?

## 2021-04-11 NOTE — ED Notes (Signed)
Dinner ordered--Regular Diet ?

## 2021-04-11 NOTE — Progress Notes (Signed)
Regarding pt's currently ordered MRIs. Pt given meds for claustrophobia prior to coming down for MRI by RN. Pt arrived not alert and oriented. Able to rouse pt enough to provide first name only. Unable to obtain any required MRI safety screening information in current state. Pt without viable family contact to provide screening info on pt's behalf. Sent pt back to ED. Will contact ordering provider to order KUB xray to fully clear pt for MRI safety via prior imaging. RN made aware. ?

## 2021-04-11 NOTE — H&P (Addendum)
? ? ? ?Date: 04/11/2021     ?     ?     ?Patient Name:  Logan Nelson MRN: [id number]  ?DOB: Feb 18, 1965 Age / Sex: 56 y.o., male   ?PCP: [name], MD    ?     ?Medical Service: Internal Medicine Teaching Service    ?     ?Attending Physician: Dr. Inez Catalina, MD    ?First Contact: Dr. Ned Card Pager: [phone]  ?Second Contact: Dr. Kirke Corin Pager: [phone]  ?     ?After Hours (After 5p/  First Contact Pager: [phone]  ?weekends / holidays): Second Contact Pager: [phone]  ? ?Chief Complaint: Right sided weakness ? ?History of Present Illness: Logan Nelson is a 56 yo male with hypertension, abdominal wall hernias, GERD, and B12 deficiency who presents to the hospital with a chief complaint of right sided weakness. ? ?The patient states that he thinks he had a stroke a few days ago. He has had difficulty walking and feels weak in his both of his legs, although he attributes this to the pain he has in his legs. Both of his lower extremities are edematous and he is unsure how long they have been swollen for, although he notes that this makes him experience a lot of pain. His left leg is more swollen than the right. He denies any history of blood clots that he knows of, any recent surgeries or travel. He also states that his right arm has been weak for the past 2 days. He has been dropping things out of his right hand and can't keep his arm raised. He could not even pick up a milk carton and states that he was dropping his keys, as well. He also notes that he fell 2 days ago because of his weakness and felt like his legs gave out from underneath him. He states that he fell face first onto the pavement and did not lose consciousness at that time.  ? ?The patient denies any fevers, chills, blurry vision, dizziness, chest pain, palpitations, shortness of breath, vomiting, diarrhea, constipation, melena, hematochezia, or any urinary symptoms at this time. He does endorse chronic abdominal pain and nausea  secondary to his known abdominal wall hernia. The patient's appetite has been stable and he has had no difficulties swallowing.  ? ?Meds:  ?Previously on amlodipine 2.5 mg and B12 injections ?No outpatient medications have been marked as taking for the 04/11/21 encounter [facility] Encounter).  ? ? ? ?Allergies: ?Allergies as of 04/11/2021  ? (No Known Allergies)  ? ?Past Medical History:  ?Diagnosis Date  ? Anemia   ? Arthritis   ? Bradycardia   ? Dyspnea   ? Falling episodes   ? GERD (gastroesophageal reflux disease)   ? Hernia of abdominal wall   ? Hypertension   ? Lower extremity edema   ? ? ?Family History: Family history of HTN ? ?Social History: Lives in [place] with his granddad who helps him with meals- otherwise independent of ADL and IADLs. Has not seen a PCP in over a year.  ?Previously smoked cigarettes, unsure when he quit. No alcohol or illicit drug use.  ? ?Review of Systems: ?A complete ROS was negative except as per HPI.  ? ?Physical Exam: ?Blood pressure 118/80, pulse 68, temperature 98.9 ?F (37.2 ?C), resp. rate 16, SpO2 100 %. ?Physical Exam ?Constitutional:   ?   General: He is not in acute distress. ?HENT:  ?   Mouth/Throat:  ?  Comments: Poor dentition with many missing teeth  ?Eyes:  ?   Extraocular Movements: Extraocular movements intact.  ?   Pupils: Pupils are equal, round, and reactive to light.  ?Cardiovascular:  ?   Rate and Rhythm: Normal rate and regular rhythm.  ?   Heart sounds: No murmur heard. ?Pulmonary:  ?   Effort: Pulmonary effort is normal. No respiratory distress.  ?   Breath sounds: Normal breath sounds. No wheezing, rhonchi or rales.  ?Abdominal:  ?   General: Bowel sounds are normal. There is distension.  ?   Palpations: Abdomen is soft.  ?   Tenderness: There is no abdominal tenderness.  ?   Hernia: A hernia is present.  ?   Comments: Large umbilical hernia in central abdomen. Large right inguinal hernia- both are soft, but not very reducible   ?Musculoskeletal:  ?    Right lower leg: Edema present.  ?   Left lower leg: Edema present.  ?   Comments: 2+ RLE pitting edema and 3+ pitting LLE edema  ?Skin: ?   General: Skin is warm and dry.  ?Neurological:  ?   Mental Status: He is alert and oriented to person, place, and time.  ?   Cranial Nerves: No cranial nerve deficit.  ?   Sensory: No sensory deficit.  ?   Motor: Weakness present.  ?   Coordination: Coordination abnormal.  ?   Comments: Abnml finger to nose testing on right ?2-3/5 RUE strength, 4/5 LUE strength ?3/5 RLE and LLE strength   ?Psychiatric:     ?   Mood and Affect: Mood normal.     ?   Behavior: Behavior normal.  ? ? ?EKG: personally reviewed my interpretation is normal sinus rhythm with LAFB ? ?CXR: personally reviewed my interpretation is no acute process ? ?Assessment & Plan by Problem: ?Principal Problem: ?  Stroke [facility]) ? ?#Right upper extremity weakness, stroke rule out ?Patient presented with 2 days of RUE weakness, as well as generalized lower extremity weakness. On neuro exam, the patient has 2-3/5 RUE strength and has abnormal finger to nose testing, concerning for an infarction. CT Head with no acute intracranial abnormalities but did not a small hypoattenuation in the R posterior pons consistent with an old lacunar infarct, although patient does not remember ever being told he had a stroke. MRI brain pending.  ?- Neurology consulted, appreciate recs ?- MRI brain and cervical spine pending ?- Aspirin 81 mg daily  ?- A1c, lipid panel, TSH ?- Consider statin if LDL elevated  ?- PT/OT/SLP  ?- Neuro checks q4h  ? ?#Hx of hypertension ?Previously on amlodipine 2.5 mg daily, however, patient states that someone took him off of this medicine and he does not know why. BP normotensive. Will continue to monitor.  ? ?#Bilateral lower extremity edema L>R ?Patient with bilateral lower extremity edema, worse on the left than the right. The patient states that this has been causing pain in his legs and has given him  trouble ambulating. He is unsure when the swelling started. He denies any hx of blood clots that he knows of, any recent surgeries, or any recent travel. Unlikely to be related to heart failure as the patient had a normal echo in December. Could be secondary to DVT vs venous insufficiency.  ?- Bilateral lower extremity ultrasound to rule out DVT ?- Compression stockings ordered ? ?#Hx of B12 deficiency ?Patient previously treated with weekly B12 injections.  ?- Check B12 level  ? ?#  Large umbilical hernia ?#Large right inguinal hernia ?Patient has followed with [facility] for his hernias. He has had significant abdominal pain and nausea. Plan for open incisional hernia repair and right inguinal hernia repair with mesh later this month, on 3/15.  ?- KUB xray to evaluate for MRI safety  ? ? ?Best practices: ?Code: Full ?Diet: Regular ?VTE: held  ?Dispo: Admit patient to Observation with expected length of stay less than 2 midnights. ? ?Signed: ?Chauncey Mann, DO ?04/11/2021, 3:39 PM  ?Pager: [phone] ?After 5pm on weekdays and 1pm on weekends: On Call pager: [phone] ? ?

## 2021-04-12 ENCOUNTER — Observation Stay (HOSPITAL_COMMUNITY): Payer: Self-pay

## 2021-04-12 ENCOUNTER — Observation Stay (HOSPITAL_COMMUNITY): Payer: Medicaid Other

## 2021-04-12 ENCOUNTER — Encounter (HOSPITAL_COMMUNITY): Payer: Self-pay | Admitting: Physician Assistant

## 2021-04-12 DIAGNOSIS — E440 Moderate protein-calorie malnutrition: Secondary | ICD-10-CM | POA: Diagnosis not present

## 2021-04-12 DIAGNOSIS — K439 Ventral hernia without obstruction or gangrene: Secondary | ICD-10-CM | POA: Diagnosis not present

## 2021-04-12 DIAGNOSIS — K760 Fatty (change of) liver, not elsewhere classified: Secondary | ICD-10-CM | POA: Diagnosis not present

## 2021-04-12 DIAGNOSIS — E8809 Other disorders of plasma-protein metabolism, not elsewhere classified: Secondary | ICD-10-CM | POA: Diagnosis not present

## 2021-04-12 DIAGNOSIS — E43 Unspecified severe protein-calorie malnutrition: Secondary | ICD-10-CM | POA: Diagnosis not present

## 2021-04-12 DIAGNOSIS — N400 Enlarged prostate without lower urinary tract symptoms: Secondary | ICD-10-CM | POA: Diagnosis not present

## 2021-04-12 DIAGNOSIS — E46 Unspecified protein-calorie malnutrition: Secondary | ICD-10-CM

## 2021-04-12 DIAGNOSIS — I639 Cerebral infarction, unspecified: Secondary | ICD-10-CM | POA: Diagnosis not present

## 2021-04-12 DIAGNOSIS — I10 Essential (primary) hypertension: Secondary | ICD-10-CM | POA: Diagnosis not present

## 2021-04-12 LAB — LIPID PANEL
Cholesterol: 75 mg/dL (ref 0–200)
HDL: 24 mg/dL — ABNORMAL LOW (ref >40)
LDL Cholesterol: 36 mg/dL (ref 0–99)
Total CHOL/HDL Ratio: 3.1 RATIO
Triglycerides: 73 mg/dL (ref <150)
VLDL: 15 mg/dL (ref 0–40)

## 2021-04-12 LAB — CBC
HCT: 35.5 % — ABNORMAL LOW (ref 39.0–52.0)
Hemoglobin: 11.9 g/dL — ABNORMAL LOW (ref 13.0–17.0)
MCH: 29.8 pg (ref 26.0–34.0)
MCHC: 33.5 g/dL (ref 30.0–36.0)
MCV: 88.8 fL (ref 80.0–100.0)
Platelets: 315 10*3/uL (ref 150–400)
RBC: 4 MIL/uL — ABNORMAL LOW (ref 4.22–5.81)
RDW: 17 % — ABNORMAL HIGH (ref 11.5–15.5)
WBC: 6.5 10*3/uL (ref 4.0–10.5)
nRBC: 0 % (ref 0.0–0.2)

## 2021-04-12 LAB — COMPREHENSIVE METABOLIC PANEL
ALT: 97 U/L — ABNORMAL HIGH (ref 0–44)
AST: 69 U/L — ABNORMAL HIGH (ref 15–41)
Albumin: 2.6 g/dL — ABNORMAL LOW (ref 3.5–5.0)
Alkaline Phosphatase: 125 U/L (ref 38–126)
Anion gap: 9 (ref 5–15)
BUN: 21 mg/dL — ABNORMAL HIGH (ref 6–20)
CO2: 24 mmol/L (ref 22–32)
Calcium: 8.2 mg/dL — ABNORMAL LOW (ref 8.9–10.3)
Chloride: 108 mmol/L (ref 98–111)
Creatinine, Ser: 0.78 mg/dL (ref 0.61–1.24)
GFR, Estimated: >60 mL/min (ref >60)
Glucose, Bld: 65 mg/dL — ABNORMAL LOW (ref 70–99)
Potassium: 3.4 mmol/L — ABNORMAL LOW (ref 3.5–5.1)
Sodium: 141 mmol/L (ref 135–145)
Total Bilirubin: 1 mg/dL (ref 0.3–1.2)
Total Protein: 5.8 g/dL — ABNORMAL LOW (ref 6.5–8.1)

## 2021-04-12 LAB — RAPID URINE DRUG SCREEN, HOSP PERFORMED
Amphetamines: NOT DETECTED
Barbiturates: NOT DETECTED
Benzodiazepines: POSITIVE — AB
Cocaine: POSITIVE — AB
Opiates: NOT DETECTED
Tetrahydrocannabinol: POSITIVE — AB

## 2021-04-12 LAB — HIV ANTIBODY (ROUTINE TESTING W REFLEX): HIV Screen 4th Generation wRfx: NONREACTIVE

## 2021-04-12 LAB — VITAMIN B12: Vitamin B-12: 929 pg/mL — ABNORMAL HIGH (ref 180–914)

## 2021-04-12 LAB — TSH: TSH: 2.315 u[IU]/mL (ref 0.350–4.500)

## 2021-04-12 LAB — HEMOGLOBIN A1C
Hgb A1c MFr Bld: 4 % — ABNORMAL LOW (ref 4.8–5.6)
Mean Plasma Glucose: 68.1 mg/dL

## 2021-04-12 LAB — VITAMIN D 25 HYDROXY (VIT D DEFICIENCY, FRACTURES): Vit D, 25-Hydroxy: 6.29 ng/mL — ABNORMAL LOW (ref 30–100)

## 2021-04-12 LAB — HEPATITIS C ANTIBODY: HCV Ab: NONREACTIVE

## 2021-04-12 LAB — PROTIME-INR
INR: 1.4 — ABNORMAL HIGH (ref 0.8–1.2)
Prothrombin Time: 17.5 seconds — ABNORMAL HIGH (ref 11.4–15.2)

## 2021-04-12 MED ORDER — ONDANSETRON HCL 4 MG/2ML IJ SOLN
4.0000 mg | Freq: Once | INTRAMUSCULAR | Status: AC
  Administered 2021-04-12: 4 mg via INTRAVENOUS
  Filled 2021-04-12: qty 2

## 2021-04-12 MED ORDER — ENOXAPARIN SODIUM 40 MG/0.4ML IJ SOSY
40.0000 mg | PREFILLED_SYRINGE | INTRAMUSCULAR | Status: DC
  Administered 2021-04-12 – 2021-04-13 (×2): 40 mg via SUBCUTANEOUS
  Filled 2021-04-12 (×2): qty 0.4

## 2021-04-12 NOTE — TOC Initial Note (Signed)
Transition of Care (TOC) - Initial/Assessment Note  ? ? ?Patient Details  ?Name: Logan Nelson ?MRN: [id number] ?Date of Birth: 07-07-1965 ? ?Transition of Care (TOC) CM/SW Contact:    ?Logan Balo, RN ?Phone Number: ?04/12/2021, 2:01 PM ? ?Clinical Narrative:                 ?Patient is from home with his grandfather that provides the patient some assistance at home. Pt usually drives self to appointments but has become weak and has been having issues driving self. States his Logan Nelson can provide transportation assistance. ?Pt says he isnt taking any medications at home #1 due to them taking him off them #2 he has trouble affording ?Pt is scheduled to have a hernia surgery next week and is hoping to not have this postponed again.  ?Pt has a SW through the [facility] that has been assisting him to find housing and obtaining disability.  ?Pt has PcP through Primary care at [facility] but he is not happy with this clinic and asked to have another [facility]. Cm called [facility] and they would be able to see him but he would have to establish as a "new Pt".  ?TOC following for d/c plans as pt denied for CIR as he lacks medical necessity.  ?Pt will need a new PCP with [facility].  ? ?Expected Discharge Plan: Home w Home Health Services ?Barriers to Discharge: Continued Medical Work up, Inadequate or no insurance ? ? ?Patient Goals and CMS Choice ?  ?  ?  ? ?Expected Discharge Plan and Services ?Expected Discharge Plan: Home w Home Health Services ?  ?Discharge Planning Services: CM Consult ?  ?Living arrangements for the past 2 months: Single Family Home ?                ?  ?  ?  ?  ?  ?  ?  ?  ?  ?  ? ?Prior Living Arrangements/Services ?Living arrangements for the past 2 months: Single Family Home ?Lives with:: Relatives (Grandfather) ?Patient language and need for interpreter reviewed:: Yes ?Do you feel safe going back to the place where you live?: Yes      ?  ?  ?Current home services: DME (cane) ?Criminal  Activity/Legal Involvement Pertinent to Current Situation/Hospitalization: No - Comment as needed ? ?Activities of Daily Living ?  ?  ? ?Permission Sought/Granted ?  ?  ?   ?   ?   ?   ? ?Emotional Assessment ?Appearance:: Appears stated age ?Attitude/Demeanor/Rapport: Engaged ?Affect (typically observed): Agitated ?Orientation: : Oriented to Self, Oriented to Place, Oriented to  Time, Oriented to Situation ?  ?Psych Involvement: No (comment) ? ?Admission diagnosis:  Dysmetria [R27.8] ?Ventral hernia [K43.9] ?Stroke [facility]) [I63.9] ?Patient Active Problem List  ? Diagnosis Date Noted  ? Protein-calorie malnutrition (HCC) 04/12/2021  ? Hepatic steatosis 04/12/2021  ? Stroke [facility]) 04/11/2021  ? Small bowel obstruction (HCC) 10/13/2020  ? Anemia due to vitamin B12 deficiency 10/13/2020  ? Hypertension 01/09/2015  ? 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 ?PCP:  Logan Skeans, MD ?Pharmacy:   ?[id number] - [place], [place] - [address] ?[address] ?[place] [address] ?Phone: [phone] Fax: [phone] ? ? ? ? ?Social Determinants of Health (SDOH) Interventions ?  ? ?Readmission Risk Interventions ?No flowsheet data found. ? ? ?

## 2021-04-12 NOTE — Evaluation (Signed)
Clinical/Bedside Swallow Evaluation ?Patient Details  ?Name: Logan Nelson ?MRN: [id number] ?Date of Birth: [birth year] ? ?Today's Date: 04/12/2021 ?Time: SLP Start Time (ACUTE ONLY): [id number] SLP Stop Time (ACUTE ONLY): 1330 ?SLP Time Calculation (min) (ACUTE ONLY): 15 min ? ?Past Medical History:  ?Past Medical History:  ?Diagnosis Date  ? Anemia   ? Arthritis   ? Bradycardia   ? Dyspnea   ? Falling episodes   ? GERD (gastroesophageal reflux disease)   ? Hernia of abdominal wall   ? Hypertension   ? Lower extremity edema   ? ?Past Surgical History:  ?Past Surgical History:  ?Procedure Laterality Date  ? ABDOMINAL SURGERY    ? ?HPI:  ?56yo AA male admitted 04/11/21 with right side weakness/swelling. PMH: HTN, abdominal wall hernias, GERD, B12 deficiency, anemia, bradycardia, dyspnea, falls, edema  ?  ?Assessment / Plan / Recommendation  ?Clinical Impression ? Pt seen at bedside for assessment of swallow function and safety. Pt presents with unremarkable CN exam. He has some teeth, but is missing several. He reports limiting his solids to "really soft foods" due to poor dentition. Pt consumed several large consecutive boluses of water (>3oz) without overt s/s aspiration. Pt also tolerated trials of puree and solid textures without obvious oral issues or overt s/s aspiration. Will downgrade diet to dys 2 to facilitate effective mastication. If pt reports this diet is too restricted, may advance to mechanical soft without reassessment. ST signing off at this time. Please reconsult if needs arise. ? ?SLP Visit Diagnosis: Dysphagia, unspecified (R13.10) ?   ?Aspiration Risk ? Mild aspiration risk  ?  ?Diet Recommendation Dysphagia 2 (Fine chop);Thin liquid  ? ?Liquid Administration via: Cup;Straw ?Medication Administration: Whole meds with liquid ?Supervision: Patient able to self feed ?Compensations: Slow rate;Small sips/bites ?Postural Changes: Seated upright at 90 degrees;Remain upright for at least 30 minutes after po  intake  ?  ?Other  Recommendations Oral Care Recommendations: Oral care BID   ? ?Recommendations for follow up therapy are one component of a multi-disciplinary discharge planning process, led by the attending physician.  Recommendations may be updated based on patient status, additional functional criteria and insurance authorization. ? ?Follow up Recommendations No SLP follow up  ? ? ?  ?Assistance Recommended at Discharge None  ?Functional Status Assessment Patient has not had a recent decline in their functional status  ?   ? ?Prognosis Prognosis for Safe Diet Advancement: Fair ?Barriers to Reach Goals: Other (Comment) (poor dentition)  ? ?  ? ?Swallow Study   ?General Date of Onset: 04/11/21 ?HPI: 56yo AA male admitted 04/11/21 with right side weakness/swelling. PMH: HTN, abdominal wall hernias, GERD, B12 deficiency, anemia, bradycardia, dyspnea, falls, edema ?Type of Study: Bedside Swallow Evaluation ?Previous Swallow Assessment: none ?Diet Prior to this Study: Regular;Thin liquids ?Temperature Spikes Noted: No ?Respiratory Status: Room air ?History of Recent Intubation: No ?Behavior/Cognition: Alert;Cooperative ?Oral Cavity Assessment: Within Functional Limits ?Oral Care Completed by SLP: No ?Oral Cavity - Dentition: Missing dentition;Poor condition ?Vision: Functional for self-feeding ?Self-Feeding Abilities: Able to feed self;Needs set up ?Patient Positioning: Upright in bed ?Baseline Vocal Quality: Normal ?Volitional Cough: Strong ?Volitional Swallow: Able to elicit  ?  ?Oral/Motor/Sensory Function Overall Oral Motor/Sensory Function: Within functional limits   ?Ice Chips Ice chips: Not tested   ?Thin Liquid Thin Liquid: Within functional limits ?Presentation: Straw  ?  ?Nectar Thick Nectar Thick Liquid: Not tested   ?Honey Thick Honey Thick Liquid: Not tested   ?Puree Puree: Within functional limits ?Presentation:  Self Fed;Spoon   ?Solid ? ? ?  Solid: Within functional limits ?Presentation: Self Fed ?Other  Comments: Pt reports he eats soft foods due to poor dentition  ? ?  ?Florentina Marquart B. Ahrianna Siglin, MSP, CCC-SLP ?Speech Language Pathologist ?Office: [phone] ? ?Shonna Chock ?04/12/2021,1:38 PM ? ? ? ?

## 2021-04-12 NOTE — TOC CAGE-AID Note (Signed)
Transition of Care (TOC) - CAGE-AID Screening ? ? ?Patient Details  ?Name: Logan Nelson ?MRN: [id number] ?Date of Birth: [birth year] ? ?Transition of Care (TOC) CM/SW Contact:    ?Remmy Crass C Tarpley-Carter, LCSWA ?Phone Number: ?04/12/2021, 12:32 PM ? ? ?Clinical Narrative: ?Pt participated in Cage-Aid.  Pt stated he does not use substance or ETOH.  Pt was offered resources, due to substance detected.   ? ?Insurance underwriter, MSW, LCSW-A ?Pronouns:  She/Her/Hers ?[facility] ?Clinical Social Worker ?Direct Number:  [phone] ?[email or web address]  ? ? ? ?CAGE-AID Screening: ?  ? ?Have You Ever Felt You Ought to Cut Down on Your Drinking or Drug Use?: No ?Have People Annoyed You By Critizing Your Drinking Or Drug Use?: No ?Have You Felt Bad Or Guilty About Your Drinking Or Drug Use?: No ?Have You Ever Had a Drink or Used Drugs First Thing In The Morning to Steady Your Nerves or to Get Rid of a Hangover?: No ?CAGE-AID Score: 0 ? ?Substance Abuse Education Offered: Yes ? ?Substance abuse interventions: Educational Materials ? ? ? ? ? ? ?

## 2021-04-12 NOTE — Progress Notes (Signed)
Patient ID: Logan Nelson, male   DOB: 1965/03/31, 56 y.o.   MRN: [id number] ?BP 115/86 (BP Location: Left Arm)   Pulse 70   Temp 98.5 ?F (36.9 ?C) (Oral)   Resp 18   SpO2 100%  ?I have seen and evaluated [name]. His an extraordinarily bad historian. He can not offer a last time normal. He is unable to state when various components of his weakness, and coordination difficulties.  ?He has 3/5 strength in the right upper extremity, and 4+/5- strength in the left upper extremity. ?There is no correlation between his presentation and MRI. I do not believe the imaging supports any operative options. I have explained this to he and his ex wife.  ? ?

## 2021-04-12 NOTE — Progress Notes (Signed)
Inpatient Rehab Admissions Coordinator:  ? ?Per therapy recommendation, patient was screened for CIR candidacy by Giliana Vantil, MS, CCC-SLP. At this time, Pt. does not appear to demonstrate medical necessity to justify in hospital rehabilitation/CIR. will not pursue a rehab consult for this Pt.   Recommend other rehab venues to be pursued.  Please contact me with any questions. ? ?Loris Seelye, MS, CCC-SLP ?Rehab Admissions Coordinator  ?[phone] (celll) ?[phone] (office) ? ?

## 2021-04-12 NOTE — Evaluation (Signed)
Occupational Therapy Evaluation Patient Details Name: Logan Nelson MRN: [id number] DOB: [birth year] Today's Date: 04/12/2021   History of Present Illness 56 year old AA male presented [facility] ED with c/o right sided weakness and pain as well as severe dysmetria or impairment to extremity coordination, he fell two days prior. CT Head with no acute intracranial abnormalities, MRI pending. PMHx: anemia, arthritis, dyspnea, GERD, HTN, LE edema   Clinical Impression   Pervis was evaluated s/p the above admission list, stroke work up pending. He stated he is mod I at baseline with use of SPC intermittently, however he also stated he needs help with self care. Conflicting home set up and PLOF report given this date. Overall pt required increased time and cues for all tasks assessed this session. He was min A for functional mobility and up to mod A for ADLs. He is limited by generalized weakness, poor ROM of RUE, impaired balance, pain and impaired cognition. He will benefit from OT acutely to address the limitations listed below. Recommend CIR initially based on pt's report of indep baseline, pending confirmation of home support and medical work up.      Recommendations for follow up therapy are one component of a multi-disciplinary discharge planning process, led by the attending physician.  Recommendations may be updated based on patient status, additional functional criteria and insurance authorization.   Follow Up Recommendations  Acute inpatient rehab (3hours/day)    Assistance Recommended at Discharge Frequent or constant Supervision/Assistance  Patient can return home with the following A little help with walking and/or transfers;A little help with bathing/dressing/bathroom;Assistance with cooking/housework;Assist for transportation;Help with stairs or ramp for entrance    Functional Status Assessment  Patient has had a recent decline in their functional status and demonstrates the  ability to make significant improvements in function in a reasonable and predictable amount of time.  Equipment Recommendations  Tub/shower seat (RW)    Recommendations for Other Services Rehab consult     Precautions / Restrictions Precautions Precautions: Fall Restrictions Weight Bearing Restrictions: No      Mobility Bed Mobility Overal bed mobility: Needs Assistance Bed Mobility: Supine to Sit     Supine to sit: Min assist     General bed mobility comments: cues, heavy rail use and incr time    Transfers Overall transfer level: Needs assistance Equipment used: Rolling walker (2 wheels) Transfers: Sit to/from Stand Sit to Stand: Min guard           General transfer comment: incr time      Balance Overall balance assessment: Needs assistance Sitting-balance support: Feet supported       Standing balance support: Bilateral upper extremity supported, During functional activity Standing balance-Leahy Scale: Poor                             ADL either performed or assessed with clinical judgement   ADL Overall ADL's : Needs assistance/impaired Eating/Feeding: Set up;Sitting   Grooming: Set up;Sitting   Upper Body Bathing: Minimal assistance;Sitting   Lower Body Bathing: Moderate assistance;Sit to/from stand   Upper Body Dressing : Sitting;Min guard   Lower Body Dressing: Moderate assistance;Sit to/from stand Lower Body Dressing Details (indicate cue type and reason): donned PJ pants Toilet Transfer: Minimal assistance;Rolling walker (2 wheels);Ambulation   Toileting- Clothing Manipulation and Hygiene: Min guard;Sitting/lateral lean       Functional mobility during ADLs: Minimal assistance;Rolling walker (2 wheels) General ADL Comments:  pt requried significantly incr time and cues to complete all tasks. limited by global weakness     Vision Baseline Vision/History: 0 No visual deficits Vision Assessment?: No apparent visual deficits             Pertinent Vitals/Pain Pain Assessment Pain Assessment: Faces Faces Pain Scale: Hurts a little bit Pain Location: "everywhere" Pain Descriptors / Indicators: Discomfort Pain Intervention(s): Limited activity within patient's tolerance, Monitored during session     Hand Dominance Right   Extremity/Trunk Assessment Upper Extremity Assessment Upper Extremity Assessment: RUE deficits/detail;LUE deficits/detail RUE Deficits / Details: very limited shoulder ROM, able to move elbow, wrist and hand fully against greavity but strength is generally 4-/5. guarding RUE, flexed posture unless cued to use functionally. sligth edema noted RUE Sensation: decreased light touch RUE Coordination: decreased fine motor;decreased gross motor LUE Deficits / Details: Full ROM, strength is generally 4/5, weaker distally vs proximal LUE Sensation: WNL LUE Coordination: decreased gross motor;decreased fine motor   Lower Extremity Assessment Lower Extremity Assessment: Defer to PT evaluation   Cervical / Trunk Assessment Cervical / Trunk Assessment: Kyphotic   Communication Communication Communication: Expressive difficulties (difficult to understand at times)   Cognition Arousal/Alertness: Awake/alert Behavior During Therapy: Flat affect Overall Cognitive Status: No family/caregiver present to determine baseline cognitive functioning Area of Impairment: Attention, Memory, Following commands, Safety/judgement, Awareness, Problem solving, Orientation                 Orientation Level: Disoriented to (pt not answering orientation questions) Current Attention Level: Selective Memory: Decreased recall of precautions Following Commands: Follows one step commands with increased time Safety/Judgement: Decreased awareness of safety Awareness: Intellectual Problem Solving: Slow processing, Decreased initiation, Requires verbal cues       General Comments  VSS on RA - edema in all  extremeties     Home Living Family/patient expects to be discharged to:: Private residence Living Arrangements: Non-relatives/Friends ("Mr. Marshell Levan") Available Help at Discharge: Friend(s);Available 24 hours/day Type of Home: Apartment Home Access: Level entry     Home Layout: One level               Home Equipment: Cane - single point   Additional Comments: Pt states he lives with a friend, chart review states he lives wtih his grandfather.      Prior Functioning/Environment Prior Level of Function : Driving;Needs assist             Mobility Comments: intermittently uses SPC ADLs Comments: pt said he needs help with self care but did not elaborate        OT Problem List: Decreased strength;Decreased range of motion;Impaired balance (sitting and/or standing);Decreased activity tolerance;Decreased knowledge of use of DME or AE;Decreased safety awareness;Decreased knowledge of precautions;Pain      OT Treatment/Interventions:      OT Goals(Current goals can be found in the care plan section) Acute Rehab OT Goals Patient Stated Goal: to get some rest OT Goal Formulation: With patient Time For Goal Achievement: 04/26/21 Potential to Achieve Goals: Good ADL Goals Pt Will Perform Grooming: with modified independence;standing Pt Will Perform Lower Body Dressing: with modified independence;sit to/from stand Pt Will Transfer to Toilet: with modified independence;ambulating Pt/caregiver will Perform Home Exercise Program: Increased ROM;Increased strength;Both right and left upper extremity;With written HEP provided  OT Frequency: Min 2X/week       AM-PAC OT "6 Clicks" Daily Activity     Outcome Measure Help from another person eating meals?: A Little Help from another person  taking care of personal grooming?: A Little Help from another person toileting, which includes using toliet, bedpan, or urinal?: A Little Help from another person bathing (including washing,  rinsing, drying)?: A Lot Help from another person to put on and taking off regular upper body clothing?: A Little Help from another person to put on and taking off regular lower body clothing?: A Lot 6 Click Score: 16   End of Session Equipment Utilized During Treatment: Rolling walker (2 wheels);Gait belt Nurse Communication: Mobility status  Activity Tolerance: Patient tolerated treatment well Patient left: in chair;with call bell/phone within reach;with chair alarm set  OT Visit Diagnosis: Unsteadiness on feet (R26.81);Other abnormalities of gait and mobility (R26.89);Muscle weakness (generalized) (M62.81);History of falling (Z91.81);Hemiplegia and hemiparesis Hemiplegia - Right/Left: Right Hemiplegia - dominant/non-dominant: Dominant Hemiplegia - caused by: Unspecified                Time: [id number] OT Time Calculation (min): 23 min Charges:  OT General Charges $OT Visit: 1 Visit OT Evaluation $OT Eval Moderate Complexity: 1 Mod  Vernona Peake A Estle Sabella 04/12/2021, 9:10 AM

## 2021-04-12 NOTE — Evaluation (Signed)
Physical Therapy Evaluation ?Patient Details ?Name: Logan Nelson ?MRN: [id number] ?DOB: [birth year] ?Today's Date: 04/12/2021 ? ?History of Present Illness ? 56 year old AA male presented to [facility] ED with c/o right sided weakness and pain as well as severe dysmetria or impairment to extremity coordination, he fell two days prior. CT Head with no acute intracranial abnormalities, MRI pending. PMHx: anemia, arthritis, dyspnea, GERD, HTN, LE edema  ?Clinical Impression ? Pt required max encouragement to participate and several attempts to see. Took ~ 5 minutes for him to come to sitting on EOB after initially asking him to sit up. Once moving he was able to amb in hallway with walker. Recommend return home with grandfather and HHPT if possible. Pt's fatigue along with self limiting behavior will likely make progress slow.    ?   ? ?Recommendations for follow up therapy are one component of a multi-disciplinary discharge planning process, led by the attending physician.  Recommendations may be updated based on patient status, additional functional criteria and insurance authorization. ? ?Follow Up Recommendations Home health PT ? ?  ?Assistance Recommended at Discharge Intermittent Supervision/Assistance  ?Patient can return home with the following ? Assist for transportation ? ?  ?Equipment Recommendations Rolling walker (2 wheels)  ?Recommendations for Other Services ?    ?  ?Functional Status Assessment Patient has had a recent decline in their functional status and demonstrates the ability to make significant improvements in function in a reasonable and predictable amount of time.  ? ?  ?Precautions / Restrictions Precautions ?Precautions: Fall ?Restrictions ?Weight Bearing Restrictions: No  ? ?  ? ?Mobility ? Bed Mobility ?Overal bed mobility: Needs Assistance ?Bed Mobility: Supine to Sit, Sit to Sidelying ?  ?  ?Supine to sit: Min assist ?  ?Sit to sidelying: Supervision ?General bed mobility comments:  Assist to elevate trunk into sitting. Pt back into bed frontwards onto all 4's and then down to side ?  ? ?Transfers ?Overall transfer level: Needs assistance ?Equipment used: Rolling walker (2 wheels) ?Transfers: Sit to/from Stand ?Sit to Stand: Min guard ?  ?  ?  ?  ?  ?General transfer comment: incr time ?  ? ?Ambulation/Gait ?Ambulation/Gait assistance: Min guard ?Gait Distance (Feet): 100 Feet ?Assistive device: Rolling walker (2 wheels) ?Gait Pattern/deviations: Step-through pattern, Decreased stride length, Trunk flexed ?Gait velocity: decr ?Gait velocity interpretation: <1.31 ft/sec, indicative of household ambulator ?  ?General Gait Details: Assist for safety ? ?Stairs ?  ?  ?  ?  ?  ? ?Wheelchair Mobility ?  ? ?Modified Rankin (Stroke Patients Only) ?  ? ?  ? ?Balance Overall balance assessment: Needs assistance ?Sitting-balance support: Feet supported ?  ?  ?  ?Standing balance support: Bilateral upper extremity supported, During functional activity ?Standing balance-Leahy Scale: Poor ?Standing balance comment: walker and min guard for static standing ?  ?  ?  ?  ?  ?  ?  ?  ?  ?  ?  ?   ? ? ? ?Pertinent Vitals/Pain Pain Assessment ?Pain Assessment: Faces ?Faces Pain Scale: Hurts a little bit ?Pain Location: "everywhere" ?Pain Descriptors / Indicators: Discomfort ?Pain Intervention(s): Limited activity within patient's tolerance  ? ? ?Home Living Family/patient expects to be discharged to:: Private residence ?Living Arrangements: Other relatives (grandfather) ?Available Help at Discharge: Available 24 hours/day;Family ?Type of Home: Apartment ?Home Access: Level entry ?  ?  ?  ?Home Layout: One level ?Home Equipment: Gilmer Mor - single point ?   ?  ?  Prior Function Prior Level of Function : Driving;Needs assist ?  ?  ?  ?  ?  ?  ?Mobility Comments: intermittently uses SPC ?ADLs Comments: pt said he needs help with self care but did not elaborate ?  ? ? ?Hand Dominance  ? Dominant Hand: Right ? ?   ?Extremity/Trunk Assessment  ? Upper Extremity Assessment ?Upper Extremity Assessment: Defer to OT evaluation ?  ? ?Lower Extremity Assessment ?Lower Extremity Assessment: Generalized weakness ?  ? ?Cervical / Trunk Assessment ?Cervical / Trunk Assessment: Kyphotic  ?Communication  ? Communication: Expressive difficulties (difficult to understand at times)  ?Cognition Arousal/Alertness: Awake/alert ?Behavior During Therapy: Flat affect ?Overall Cognitive Status: No family/caregiver present to determine baseline cognitive functioning ?Area of Impairment: Attention, Memory, Following commands, Safety/judgement, Awareness, Problem solving ?  ?  ?  ?  ?  ?  ?  ?  ?  ?Current Attention Level: Selective ?Memory: Decreased recall of precautions ?Following Commands: Follows one step commands with increased time ?Safety/Judgement: Decreased awareness of safety ?Awareness: Intellectual ?Problem Solving: Slow processing, Decreased initiation, Requires verbal cues ?General Comments: unsure of cognitive issues vs participation issues ?  ?  ? ?  ?General Comments   ? ?  ?Exercises    ? ?Assessment/Plan  ?  ?PT Assessment Patient needs continued PT services  ?PT Problem List Decreased strength;Decreased activity tolerance;Decreased balance;Decreased mobility;Decreased cognition ? ?   ?  ?PT Treatment Interventions DME instruction;Gait training;Functional mobility training;Therapeutic activities;Therapeutic exercise;Balance training;Cognitive remediation;Patient/family education;Stair training   ? ?PT Goals (Current goals can be found in the Care Plan section)  ?Acute Rehab PT Goals ?Patient Stated Goal: not stated ?PT Goal Formulation: With patient ?Time For Goal Achievement: 04/26/21 ?Potential to Achieve Goals: Good ? ?  ?Frequency Min 3X/week ?  ? ? ?Co-evaluation   ?  ?  ?  ?  ? ? ?  ?AM-PAC PT "6 Clicks" Mobility  ?Outcome Measure Help needed turning from your back to your side while in a flat bed without using bedrails?:  None ?Help needed moving from lying on your back to sitting on the side of a flat bed without using bedrails?: A Little ?Help needed moving to and from a bed to a chair (including a wheelchair)?: A Little ?Help needed standing up from a chair using your arms (e.g., wheelchair or bedside chair)?: A Little ?Help needed to walk in hospital room?: A Little ?Help needed climbing 3-5 steps with a railing? : A Lot ?6 Click Score: 18 ? ?  ?End of Session Equipment Utilized During Treatment: Gait belt ?Activity Tolerance: Other (comment);Patient limited by fatigue (self limiting) ?Patient left: in bed;with call bell/phone within reach;with bed alarm set ?Nurse Communication: Mobility status ?PT Visit Diagnosis: Other abnormalities of gait and mobility (R26.89);Muscle weakness (generalized) (M62.81);History of falling (Z91.81) ?  ? ?Time: 2595-6387 ?PT Time Calculation (min) (ACUTE ONLY): 16 min ? ? ?Charges:   PT Evaluation ?$PT Eval Moderate Complexity: 1 Mod ?  ?  ?   ? ? ?[facility] PT ?Acute Rehabilitation Services ?Pager [phone] ?Office [phone] ? ? ?Angelina Ok [facility] ?04/12/2021, 5:44 PM ? ?

## 2021-04-12 NOTE — Progress Notes (Addendum)
? ?HD#1 ?SUBJECTIVE:  ?Patient Summary: Logan Nelson is a 56 y.o. with a pertinent PMH of hypertension and abdominal wall hernias, who presented with right sided weakness and admitted for stroke rule out and found to have neural foraminal narrowing at C4-C5.  ? ?Overnight Events: Patient reporting weakness although still able to use his extremities. Was also nauseous overnight, improved with zofran.  ? ?Interim History: This is hospital day 1 for this patient who was seen and evaluated at the bedside this morning. Feels better this morning, just needed some rest. R arm feels about the same as it did yesterday. Moves but grunts with effort. He does endorse some RUE numbness/tingling.  ? ?OBJECTIVE:  ?Vital Signs: ?Vitals:  ? 04/11/21 2350 04/12/21 0319 04/12/21 0814 04/12/21 1250  ?BP: (!) 113/97 114/88 107/76 115/86  ?Pulse: 73 84 68 70  ?Resp: 16 16 16 18   ?Temp: 98.3 ?F (36.8 ?C) 98.1 ?F (36.7 ?C) 98.2 ?F (36.8 ?C) 98.5 ?F (36.9 ?C)  ?TempSrc: Oral Oral  Oral  ?SpO2: 99% 100% 100% 100%  ? ?Supplemental O2: Room Air ?SpO2: 100 % ? ?There were no vitals filed for this visit. ? ?No intake or output data in the 24 hours ending 04/12/21 1357 ?Net IO Since Admission: No IO data has been entered for this period [04/12/21 1357] ? ?Physical Exam: ?General: No acute distress. Poor dentition noted. Cachectic in appearance. ?CV: RRR. No murmurs, rubs, or gallops.  ?Pulmonary: Lungs CTAB. Normal effort. No wheezing or rales. ?Abdominal: Soft. Abdominal distension with large umbilical hernia in central abdomen and large right inguinal hernia.  ?Extremities: Palpable radial and DP pulses. 2+ RLE pitting edema and 3+ LLE pitting edema. Dependent/disuse edema of RUE.  ?Skin: Warm and dry.  ?Neuro: A&Ox3. No dysarthria. Pins and needles sensation in right hand. 3/5 strength RUE. 3/5 strength BLE. CN II-XII intact. Deferred gait exam.  ?Psych: Normal mood and affect ? ? ? ? ?ASSESSMENT/PLAN:  ?Assessment: ?Principal  Problem: ?  Protein-calorie malnutrition (HCC) ?Active Problems: ?  Hypertension ?  Hepatic steatosis ? ? ?Plan: ?#Right upper extremity weakness, cerebral infarction ruled out ?#Malnutrition ?Right upper extremity weakness is less likely secondary to cervical spine narrowing as seen on MRI cervical spine, but more likely related to malnutrition. 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 A1c 4.0, TSH wnl, and LDL 36- no need for statin therapy initiation at this time. Will order some labs to further evaluate malnutrition/cachexia as this could likely be the cause of the patient's weakness. ?- Nutrition consulted, appreciate assistance ?- Echo pending ?- B1 pending; B12 wnl ?- Vitamin D pending ?- PT/OT/SLP ? ?#Hypertension ?BP well controlled off of medications. Will continue to monitor ? ?#Bilateral lower extremity edema, L>R ?LE edema has contributed to the patient's difficulty ambulating. Could be secondary to DVT, as bilateral ultrasounds have not been performed yet. Could also be secondary to venous insufficiency. Doubt this is heart failure, as the patient had a normal echo a few months ago. ?- LE ultrasounds pending ?- Continue compression stockings ? ?#Hx of B12 deficiency ?B12 within normal limits.  ? ?#Hepatic steatosis  ?#Elevated LFTs ?Patient with AST/ALT elevation, although stable. AST/ALT 69/97 this morning and bilirubin normal at 1.0. Albumin is low at 2.6. He has been noted to have hepatic steatosis on prior CT imaging.  ?- HIV, hep C Ab, and INR pending ? ?  Best Practice: ?Diet: Regular diet ?IVF: Fluids: none ?VTE: Place TED hose Start: 04/11/21 1537 ?Code: Full ?AB: none ?Therapy Recs: Pending ?Family Contact: Marcie Mowers, to be notified. ?DISPO: Anticipated discharge to Home pending Medical stability. ? ?Signature: ?Lesieli Bresee,  D.O.  ?Internal Medicine Resident, PGY-1 ?[facility]  ?Pager: [phone] ?1:57 PM, 04/12/2021  ? ?Please contact the on call pager after 5 pm and on weekends at [phone]. ? ?

## 2021-04-12 NOTE — Progress Notes (Addendum)
STROKE TEAM PROGRESS NOTE   ATTENDING NOTE: I reviewed above note and agree with the assessment and plan. Pt was seen and examined.   56 year old male with history of hypertension, B12 deficiency, smoking, alcohol abuse, substance abuse, abdominal wall hernia admitted for difficulty walking, leg swelling/pain, arm weakness right more than left.  CT no acute abnormality.  MRI showed no acute infarct.  CTA head and neck unremarkable.  EF 55% in 01/2021.  Current 2D echo pending.  LE venous Doppler pending.  LDL 36, A1c 4.0.  Creatinine 0.78.  AST/ALT 69/97, INR 1.3.  UDS showed positive for cocaine, THC and benzo.  On exam, patient lethargic, lying in bed, no family at bedside.  He is awake alert, orientated to place, age but not to time.  Moderate to severe dysarthria, no aphasia, paucity of speech, follows some commands.  Visual field full, no gaze palsy, facial symmetric.  Tongue midline.  Left upper extremity 3+/5, right upper extremity 3/5, bilateral lower extremity 2/5.  Sensation decreased bilaterally.  Finger-to-nose bilaterally with ataxia but not out of proportion to the weakness.  Etiology for patient whole body weakness likely due to cachexia, severe malnutrition and substance abuse.  No acute infarct or spinal cord compression on imaging studies.  Recommend further malnutrition work-up including vitamin deficiency, GI symptoms from abdominal wall hernia.  Substance cessation education provided.  Continue aspirin given history of stroke on imaging.  For detailed assessment and plan, please refer to above as I have made changes wherever appropriate.    Marvel Plan, MD PhD Stroke Neurology 04/12/2021 6:02 PM    INTERVAL HISTORY Patient is seen in his room with no family at the bedside.  Yesterday, he presented to the ED with right sided weakness, leg swelling and bilateral arm dysmetria that had been present for about two days.  He did have a fall two days prior to admission due to these  symptoms.  MRI reveals no acute abnormality.  Vitals:   04/11/21 1952 04/11/21 2350 04/12/21 0319 04/12/21 0814  BP: (!) 110/95 (!) 113/97 114/88 107/76  Pulse: 76 73 84 68  Resp: 18 16 16 16   Temp: 98.5 F (36.9 C) 98.3 F (36.8 C) 98.1 F (36.7 C)   TempSrc: Oral Oral Oral   SpO2: 98% 99% 100% 100%   CBC:  Recent Labs  Lab 04/11/21 1031 04/12/21 0345  WBC 5.6 6.5  HGB 11.6* 11.9*  HCT 35.7* 35.5*  MCV 91.5 88.8  PLT 334 315   Basic Metabolic Panel:  Recent Labs  Lab 04/11/21 1031 04/12/21 0345  NA 140 141  K 3.2* 3.4*  CL 111 108  CO2 23 24  GLUCOSE 102* 65*  BUN 19 21*  CREATININE 0.79 0.78  CALCIUM 8.1* 8.2*   Lipid Panel:  Recent Labs  Lab 04/12/21 0345  CHOL 75  TRIG 73  HDL 24*  CHOLHDL 3.1  VLDL 15  LDLCALC 36   HgbA1c:  Recent Labs  Lab 04/12/21 0345  HGBA1C 4.0*   Urine Drug Screen:  Recent Labs  Lab 04/12/21 0801  LABOPIA NONE DETECTED  COCAINSCRNUR POSITIVE*  LABBENZ POSITIVE*  AMPHETMU NONE DETECTED  THCU POSITIVE*  LABBARB NONE DETECTED    Alcohol Level No results for input(s): ETH in the last 168 hours.  IMAGING past 24 hours CT ANGIO HEAD NECK W WO CM  Result Date: 04/11/2021 CLINICAL DATA:  Neuro deficit, acute, stroke suspected. EXAM: CT ANGIOGRAPHY HEAD AND NECK TECHNIQUE: Multidetector CT imaging of the head  and neck was performed using the standard protocol during bolus administration of intravenous contrast. Multiplanar CT image reconstructions and MIPs were obtained to evaluate the vascular anatomy. Carotid stenosis measurements (when applicable) are obtained utilizing NASCET criteria, using the distal internal carotid diameter as the denominator. RADIATION DOSE REDUCTION: This exam was performed according to the departmental dose-optimization program which includes automated exposure control, adjustment of the mA and/or kV according to patient size and/or use of iterative reconstruction technique. CONTRAST:  183mL  OMNIPAQUE IOHEXOL 350 MG/ML SOLN COMPARISON:  Same day head CT 04/11/2021. FINDINGS: CTA NECK FINDINGS Aortic arch: Standard aortic branching. No hemodynamically significant innominate or proximal subclavian artery stenosis. Right carotid system: CCA and ICA patent within the neck without stenosis or significant atherosclerotic disease. Left carotid system: CCA and ICA patent within the neck without stenosis or significant atherosclerotic disease. Vertebral arteries: Vertebral arteries patent within the neck without stenosis or significant atherosclerotic disease. The left vertebral artery is dominant. Skeleton: Reversal of the expected cervical lordosis. No acute bony abnormality or aggressive osseous lesion. Other neck: No appreciable neck mass or cervical lymphadenopathy. Upper chest: No consolidation within the imaged lung apices. Review of the MIP images confirms the above findings CTA HEAD FINDINGS Anterior circulation: The intracranial internal carotid arteries are patent. The M1 middle cerebral arteries are patent. No M2 proximal branch occlusion or high-grade proximal stenosis is identified. The anterior cerebral arteries are patent. No intracranial aneurysm is identified. Posterior circulation: The intracranial vertebral arteries are patent. The basilar artery is patent. The posterior cerebral arteries are patent. Posterior communicating arteries are present bilaterally. Venous sinuses: The dural venous sinuses are poorly assessed due to contrast timing. Anatomic variants: None significant. Review of the MIP images confirms the above findings IMPRESSION: CTA neck: The common carotid, internal carotid and vertebral arteries are patent within the neck without stenosis. CTA head: No intracranial large vessel occlusion or proximal high-grade arterial stenosis. Electronically Signed   By: Kellie Simmering D.O.   On: 04/11/2021 12:45   DG Abd 1 View  Result Date: 04/11/2021 CLINICAL DATA:  MRI clearance.  Ventral  hernia. EXAM: ABDOMEN - 1 VIEW COMPARISON:  Abdominopelvic CT 12/17/2020 FINDINGS: No visualized radiopaque foreign body or implanted medical device. Excreted IV contrast within both renal collecting systems in the urinary bladder. Gaseous distention of bowel in the upper abdomen with small-bowel distention of at least 6.3 cm. Additional bowel distension may be colonic. Right abdominal wall appears vertebra in, suboptimally assessed on this frontal view. IMPRESSION: 1. No visualized radiopaque foreign body or implanted medical device to preclude MRI imaging. 2. Gaseous distention of bowel in the upper abdomen with small-bowel distention of at least 6.3 cm. Additional bowel distension may be colonic. Electronically Signed   By: Keith Rake M.D.   On: 04/11/2021 17:44   CT HEAD WO CONTRAST (5MM)  Result Date: 04/11/2021 CLINICAL DATA:  Concern for stroke a few days ago with dropping things out of the patient's right hand. Also reports blunt trauma to the face. EXAM: CT HEAD WITHOUT CONTRAST CT MAXILLOFACIAL WITHOUT CONTRAST TECHNIQUE: Multidetector CT imaging of the head and maxillofacial structures were performed using the standard protocol without intravenous contrast. Multiplanar CT image reconstructions of the maxillofacial structures were also generated. RADIATION DOSE REDUCTION: This exam was performed according to the departmental dose-optimization program which includes automated exposure control, adjustment of the mA and/or kV according to patient size and/or use of iterative reconstruction technique. COMPARISON:  03/14/2003 FINDINGS: CT HEAD FINDINGS Brain: No  evidence of acute infarction, hemorrhage, hydrocephalus, extra-axial collection or mass lesion/mass effect. Small well-defined focus of hypoattenuation in the right posterior pons, new from the prior CT, consistent with an old lacunar infarct. Vascular: No hyperdense vessel or unexpected calcification. Skull: Normal. Negative for fracture or  focal lesion. Other: None. CT MAXILLOFACIAL FINDINGS Osseous: No acute fracture or mandibular dislocation. No destructive process. Old fracture of the medial left orbital wall. Orbits: Negative. No traumatic or inflammatory finding. Sinuses: Clear. Soft tissues: No soft tissue contusion or hematoma. IMPRESSION: HEAD CT 1. No acute intracranial abnormalities. 2. Small well-defined focus of hypoattenuation in the right posterior pons consistent with an old lacunar infarct, new since 2005. MAXILLOFACIAL CT 1. No acute fracture or acute finding. Electronically Signed   By: Lajean Manes M.D.   On: 04/11/2021 12:41   MR BRAIN WO CONTRAST  Addendum Date: 04/11/2021   ADDENDUM REPORT: 04/11/2021 20:06 ADDENDUM: Incidental note is made of a small T1 hypointense, T2 hyperintense lesion at midline posterior to the pituitary gland in the sella, which is somewhat better visualized on the same-day MRI of the cervical spine. This lesion measures up to 4 x 4 x 4 mm (series 11, image 10 and series 7, image 12). 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 Electronically Signed   By: Merilyn Baba M.D.   On: 04/11/2021 20:06   Result Date: 04/11/2021 CLINICAL DATA:  Stroke suspected EXAM: MRI HEAD WITHOUT CONTRAST TECHNIQUE: Multiplanar, multiecho pulse sequences of the brain and surrounding structures were obtained without intravenous contrast. COMPARISON:  No prior MRI, correlation is made with 06/11/2021 CT head and CTA head and neck FINDINGS: Evaluation is somewhat limited by motion artifact. Brain: No restricted diffusion to suggest acute or subacute infarct. No acute hemorrhage, mass, mass effect, or midline shift. No hydrocephalus or extra-axial collection. Scattered T2 hyperintense signal in the periventricular white matter, likely the sequela of chronic small vessel ischemic disease. Lacunar infarct in the right pons. Vascular: Normal flow voids. Skull and upper cervical spine:  Normal marrow signal. Subcutaneous lesions in the left parietal scalp (series 9, image 18), possibly epidermal inclusion cysts. Sinuses/Orbits: Remote left lamina papyracea fracture. The orbits and paranasal sinuses are otherwise unremarkable. Other: Trace fluid in right mastoid air cells. IMPRESSION: Evaluation is limited by motion artifact. Within this limitation, no acute intracranial process. Electronically Signed: By: Merilyn Baba M.D. On: 04/11/2021 19:54   MR CERVICAL SPINE WO CONTRAST  Result Date: 04/11/2021 CLINICAL DATA:  Myelopathy, acute EXAM: MRI CERVICAL SPINE WITHOUT CONTRAST TECHNIQUE: Multiplanar, multisequence MR imaging of the cervical spine was performed. No intravenous contrast was administered. COMPARISON:  No prior MRI of the cervical spine, correlation is made with 06/11/2021 CTA head and neck FINDINGS: Evaluation is somewhat limited by motion artifact. Alignment: Reversal of the normal cervical lordosis. No significant listhesis. Vertebrae: No acute fracture or suspicious osseous lesion. Cord: Normal signal and morphology. Posterior Fossa, vertebral arteries, paraspinal tissues: Lacunar infarct in the right pons, better evaluated on the same-day MRI the head. 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 Disc levels: C2-C3: Mild disc bulge. No spinal canal stenosis. Facet arthropathy. Mild right neural foraminal narrowing. C3-C4: Mild disc bulge with small right paracentral disc protrusion. Uncovertebral and facet arthropathy. No spinal canal stenosis. Mild bilateral neural foraminal narrowing. C4-C5: Mild disc bulge with small central disc protrusion, which indents the ventral spinal cord. No spinal canal stenosis. Uncovertebral and facet arthropathy.  Mild-to-moderate bilateral neural foraminal narrowing. C5-C6: No definite disc bulge. No spinal canal stenosis. Uncovertebral and facet arthropathy. Mild right neural foraminal narrowing. C6-C7: No  significant disc bulge. No spinal canal stenosis or neuroforaminal narrowing. C7-T1: No significant disc bulge. No spinal canal stenosis or neuroforaminal narrowing. IMPRESSION: Evaluation is limited by motion artifact. Within this limitation, no definite spinal canal stenosis and up to mild-to-moderate neural foraminal narrowing, at C4-C5, as described above. Electronically Signed   By: Merilyn Baba M.D.   On: 04/11/2021 20:15   CT Maxillofacial Wo Contrast  Result Date: 04/11/2021 CLINICAL DATA:  Concern for stroke a few days ago with dropping things out of the patient's right hand. Also reports blunt trauma to the face. EXAM: CT HEAD WITHOUT CONTRAST CT MAXILLOFACIAL WITHOUT CONTRAST TECHNIQUE: Multidetector CT imaging of the head and maxillofacial structures were performed using the standard protocol without intravenous contrast. Multiplanar CT image reconstructions of the maxillofacial structures were also generated. RADIATION DOSE REDUCTION: This exam was performed according to the departmental dose-optimization program which includes automated exposure control, adjustment of the mA and/or kV according to patient size and/or use of iterative reconstruction technique. COMPARISON:  03/14/2003 FINDINGS: CT HEAD FINDINGS Brain: No evidence of acute infarction, hemorrhage, hydrocephalus, extra-axial collection or mass lesion/mass effect. Small well-defined focus of hypoattenuation in the right posterior pons, new from the prior CT, consistent with an old lacunar infarct. Vascular: No hyperdense vessel or unexpected calcification. Skull: Normal. Negative for fracture or focal lesion. Other: None. CT MAXILLOFACIAL FINDINGS Osseous: No acute fracture or mandibular dislocation. No destructive process. Old fracture of the medial left orbital wall. Orbits: Negative. No traumatic or inflammatory finding. Sinuses: Clear. Soft tissues: No soft tissue contusion or hematoma. IMPRESSION: HEAD CT 1. No acute intracranial  abnormalities. 2. Small well-defined focus of hypoattenuation in the right posterior pons consistent with an old lacunar infarct, new since 2005. MAXILLOFACIAL CT 1. No acute fracture or acute finding. Electronically Signed   By: Lajean Manes M.D.   On: 04/11/2021 12:41    PHYSICAL EXAM General:  Drowsy, thin-appearing patient in no acute distress, bilateral lower extremity edema noted Respiratory:  Regular, unlabored respirations on room air  NEURO:  Mental Status: AA&Ox3  Speech/Language: speech is without dysarthria or aphasia.    Cranial Nerves:  II: PERRL.  III, IV, VI: EOMI. Eyelids elevate symmetrically.  V: Sensation is intact to light touch  VII: Smile is symmetrical.  VIII: hearing intact to voice. IX, X: Phonation is normal.  XII: tongue is midline without fasciculations. Motor: 4/5 strength to BUE, 2/5 strength to BLE Sensation- Intact to light touch bilaterally with pins and needles sensation in bilateral hands Coordination: FTN shows bilateral dysmetria Gait- deferred   ASSESSMENT/PLAN Mr. Nil Amirian is a 56 y.o. male with history of HTN, abdominal wall hernia, GERD, stroke and B12 deficiency presenting with right sided weakness, leg swelling and bilateral arm dysmetria that had been present for about two days.  He did have a fall two days prior to admission due to these symptoms.  MRI reveals no acute abnormality.  Neurological manifestations from malnutrition and cachexia and substance abuse Exam showed whole body weakness and ataxia not out of proportion to weakness Significant malnutrition and cachexia, recommend further work-up for malnutrition and vitamin deficiency CT head no acute abnormality, small, well-defined focus of hypoattenuation in right posterior pons consistent with old infarct CTA head & neck no LVO or significant stenosis MRI  motion degraded. No acute abnormality 2D Echo  pending LDL 36 HgbA1c 4.0 VTE prophylaxis -SCDs No  antithrombotic prior to admission, now on aspirin 81 mg daily.  Continue on discharge given old pontine infarcts. Therapy recommendations:  CIR Disposition:  pending  Substance abuse Patient stated that he quit smoking 2 months ago Stated that remote alcohol abuse UDS positive for cocaine and THC Sensation education provided  Hypertension Home meds:  amlodipine 2.5 mg daily Stable Long-term BP goal normotensive  Hyperlipidemia Home meds:  none LDL 36, goal < 70 High intensity statin not indicated as LDL below goal  Other Stroke Risk Factors Former Cigarette smoker Hx stroke -CT showed chronic pontine infarct  Other Active Problems Bilateral lower extremity edema Lower extremity ultrasound pending  Compression stockings Abdominal wall hernias Likely one of the source of malnutrition with GI symptoms  Hospital day # [place] , MSN, AGACNP-BC [facility] See Amion for schedule and pager information 04/12/2021 12:14 PM    To contact Stroke Continuity provider, please refer to [email or web address] After hours, contact General Neurology

## 2021-04-13 ENCOUNTER — Observation Stay (HOSPITAL_BASED_OUTPATIENT_CLINIC_OR_DEPARTMENT_OTHER): Payer: Medicaid Other

## 2021-04-13 ENCOUNTER — Observation Stay (HOSPITAL_COMMUNITY): Payer: Medicaid Other

## 2021-04-13 DIAGNOSIS — I10 Essential (primary) hypertension: Secondary | ICD-10-CM | POA: Diagnosis not present

## 2021-04-13 DIAGNOSIS — K429 Umbilical hernia without obstruction or gangrene: Secondary | ICD-10-CM | POA: Diagnosis present

## 2021-04-13 DIAGNOSIS — K219 Gastro-esophageal reflux disease without esophagitis: Secondary | ICD-10-CM | POA: Diagnosis present

## 2021-04-13 DIAGNOSIS — E785 Hyperlipidemia, unspecified: Secondary | ICD-10-CM | POA: Diagnosis present

## 2021-04-13 DIAGNOSIS — Z87891 Personal history of nicotine dependence: Secondary | ICD-10-CM | POA: Diagnosis not present

## 2021-04-13 DIAGNOSIS — K4090 Unilateral inguinal hernia, without obstruction or gangrene, not specified as recurrent: Secondary | ICD-10-CM | POA: Diagnosis present

## 2021-04-13 DIAGNOSIS — R471 Dysarthria and anarthria: Secondary | ICD-10-CM | POA: Diagnosis present

## 2021-04-13 DIAGNOSIS — R64 Cachexia: Secondary | ICD-10-CM | POA: Diagnosis present

## 2021-04-13 DIAGNOSIS — G8929 Other chronic pain: Secondary | ICD-10-CM | POA: Diagnosis present

## 2021-04-13 DIAGNOSIS — I444 Left anterior fascicular block: Secondary | ICD-10-CM | POA: Diagnosis present

## 2021-04-13 DIAGNOSIS — E440 Moderate protein-calorie malnutrition: Secondary | ICD-10-CM | POA: Diagnosis not present

## 2021-04-13 DIAGNOSIS — K439 Ventral hernia without obstruction or gangrene: Secondary | ICD-10-CM | POA: Diagnosis not present

## 2021-04-13 DIAGNOSIS — Z20822 Contact with and (suspected) exposure to covid-19: Secondary | ICD-10-CM | POA: Diagnosis present

## 2021-04-13 DIAGNOSIS — M7989 Other specified soft tissue disorders: Secondary | ICD-10-CM

## 2021-04-13 DIAGNOSIS — E46 Unspecified protein-calorie malnutrition: Secondary | ICD-10-CM | POA: Diagnosis present

## 2021-04-13 DIAGNOSIS — N400 Enlarged prostate without lower urinary tract symptoms: Secondary | ICD-10-CM | POA: Diagnosis not present

## 2021-04-13 DIAGNOSIS — E538 Deficiency of other specified B group vitamins: Secondary | ICD-10-CM | POA: Diagnosis present

## 2021-04-13 DIAGNOSIS — K432 Incisional hernia without obstruction or gangrene: Secondary | ICD-10-CM | POA: Diagnosis present

## 2021-04-13 DIAGNOSIS — F1011 Alcohol abuse, in remission: Secondary | ICD-10-CM | POA: Diagnosis present

## 2021-04-13 DIAGNOSIS — E8809 Other disorders of plasma-protein metabolism, not elsewhere classified: Secondary | ICD-10-CM | POA: Diagnosis not present

## 2021-04-13 DIAGNOSIS — E43 Unspecified severe protein-calorie malnutrition: Secondary | ICD-10-CM | POA: Diagnosis not present

## 2021-04-13 DIAGNOSIS — K760 Fatty (change of) liver, not elsewhere classified: Secondary | ICD-10-CM | POA: Diagnosis not present

## 2021-04-13 DIAGNOSIS — M1990 Unspecified osteoarthritis, unspecified site: Secondary | ICD-10-CM | POA: Diagnosis present

## 2021-04-13 DIAGNOSIS — I6389 Other cerebral infarction: Secondary | ICD-10-CM

## 2021-04-13 DIAGNOSIS — R0602 Shortness of breath: Secondary | ICD-10-CM | POA: Diagnosis present

## 2021-04-13 DIAGNOSIS — R278 Other lack of coordination: Secondary | ICD-10-CM | POA: Diagnosis present

## 2021-04-13 DIAGNOSIS — Z8249 Family history of ischemic heart disease and other diseases of the circulatory system: Secondary | ICD-10-CM | POA: Diagnosis not present

## 2021-04-13 DIAGNOSIS — I639 Cerebral infarction, unspecified: Secondary | ICD-10-CM | POA: Diagnosis not present

## 2021-04-13 LAB — CBC WITH DIFFERENTIAL/PLATELET
Abs Immature Granulocytes: 0.02 10*3/uL (ref 0.00–0.07)
Basophils Absolute: 0 10*3/uL (ref 0.0–0.1)
Basophils Relative: 0 %
Eosinophils Absolute: 0 10*3/uL (ref 0.0–0.5)
Eosinophils Relative: 0 %
HCT: 31.4 % — ABNORMAL LOW (ref 39.0–52.0)
Hemoglobin: 11 g/dL — ABNORMAL LOW (ref 13.0–17.0)
Immature Granulocytes: 0 %
Lymphocytes Relative: 28 %
Lymphs Abs: 1.7 10*3/uL (ref 0.7–4.0)
MCH: 31.2 pg (ref 26.0–34.0)
MCHC: 35 g/dL (ref 30.0–36.0)
MCV: 89 fL (ref 80.0–100.0)
Monocytes Absolute: 0.5 10*3/uL (ref 0.1–1.0)
Monocytes Relative: 9 %
Neutro Abs: 3.8 10*3/uL (ref 1.7–7.7)
Neutrophils Relative %: 63 %
Platelets: 256 10*3/uL (ref 150–400)
RBC: 3.53 MIL/uL — ABNORMAL LOW (ref 4.22–5.81)
RDW: 16.9 % — ABNORMAL HIGH (ref 11.5–15.5)
WBC: 6 10*3/uL (ref 4.0–10.5)
nRBC: 0 % (ref 0.0–0.2)

## 2021-04-13 LAB — RPR: RPR Ser Ql: NONREACTIVE

## 2021-04-13 LAB — COMPREHENSIVE METABOLIC PANEL
ALT: 91 U/L — ABNORMAL HIGH (ref 0–44)
AST: 72 U/L — ABNORMAL HIGH (ref 15–41)
Albumin: 2.3 g/dL — ABNORMAL LOW (ref 3.5–5.0)
Alkaline Phosphatase: 112 U/L (ref 38–126)
Anion gap: 8 (ref 5–15)
BUN: 25 mg/dL — ABNORMAL HIGH (ref 6–20)
CO2: 23 mmol/L (ref 22–32)
Calcium: 7.7 mg/dL — ABNORMAL LOW (ref 8.9–10.3)
Chloride: 107 mmol/L (ref 98–111)
Creatinine, Ser: 0.81 mg/dL (ref 0.61–1.24)
GFR, Estimated: >60 mL/min (ref >60)
Glucose, Bld: 71 mg/dL (ref 70–99)
Potassium: 3.2 mmol/L — ABNORMAL LOW (ref 3.5–5.1)
Sodium: 138 mmol/L (ref 135–145)
Total Bilirubin: 1 mg/dL (ref 0.3–1.2)
Total Protein: 5 g/dL — ABNORMAL LOW (ref 6.5–8.1)

## 2021-04-13 LAB — IRON AND TIBC: Iron: 50 ug/dL (ref 45–182)

## 2021-04-13 LAB — ECHOCARDIOGRAM COMPLETE
AR max vel: 3.6 cm2
AV Area VTI: 3.19 cm2
AV Area mean vel: 3.11 cm2
AV Mean grad: 2 mmHg
AV Peak grad: 3.5 mmHg
Ao pk vel: 0.94 m/s
Area-P 1/2: 3.83 cm2
S' Lateral: 3.1 cm

## 2021-04-13 LAB — PSA: Prostatic Specific Antigen: 1.31 ng/mL (ref 0.00–4.00)

## 2021-04-13 LAB — FERRITIN: Ferritin: 763 ng/mL — ABNORMAL HIGH (ref 24–336)

## 2021-04-13 MED ORDER — ONDANSETRON HCL 4 MG/2ML IJ SOLN
4.0000 mg | Freq: Four times a day (QID) | INTRAMUSCULAR | Status: DC | PRN
  Administered 2021-04-13 – 2021-04-14 (×3): 4 mg via INTRAVENOUS
  Filled 2021-04-13 (×3): qty 2

## 2021-04-13 MED ORDER — VITAMIN D (ERGOCALCIFEROL) 1.25 MG (50000 UNIT) PO CAPS
50000.0000 [IU] | ORAL_CAPSULE | ORAL | Status: DC
  Administered 2021-04-13: 50000 [IU] via ORAL
  Filled 2021-04-13: qty 1

## 2021-04-13 MED ORDER — POTASSIUM CHLORIDE 20 MEQ PO PACK
40.0000 meq | PACK | Freq: Two times a day (BID) | ORAL | Status: AC
  Administered 2021-04-13 (×2): 40 meq via ORAL
  Filled 2021-04-13 (×2): qty 2

## 2021-04-13 NOTE — Progress Notes (Signed)
Echocardiogram ?2D Echocardiogram has been performed. ? ?Logan Nelson ?04/13/2021, 10:47 AM ?

## 2021-04-13 NOTE — Progress Notes (Signed)
? ?HD#2 ?SUBJECTIVE:  ?Patient Summary: Logan Nelson is a 56 y.o. with a pertinent PMH of hypertension and abdominal wall hernias, who presented with right sided weakness and admitted for stroke rule out and found to have neural foraminal narrowing at C4-C5.  ? ?Overnight Events: Patient was nauseous overnight, improved with zofran. ? ?Interim History: This is hospital day 2 for Logan Nelson who was seen and evaluated at the bedside. He states that he is feeling cold. Having a lot of abdominal pain.feels like leg swelling is better. He still feels weak though.  ? ?OBJECTIVE:  ?Vital Signs: ?Vitals:  ? 04/12/21 1454 04/12/21 1952 04/12/21 2326 04/13/21 0358  ?BP: 116/89 (!) 110/93 115/88 105/89  ?Pulse: 66 (!) 104 70 82  ?Resp: _0 ?Temp: 98.3 ?F (36.8 ?C) 98.8 ?F (37.1 ?C) 98.7 ?F (37.1 ?C) 97.6 ?F (36.4 ?C)  ?TempSrc:  Oral Oral Oral  ?SpO2: 100% 100% 99% 98%  ? ?Supplemental O2: Room Air ?SpO2: 98 % ? ?There were no vitals filed for this visit. ? ? ?Intake/Output Summary (Last 24 hours) at 04/13/2021 0553 ?Last data filed at 04/13/2021 0400 ?Gross per 24 hour  ?Intake 150 ml  ?Output --  ?Net 150 ml  ? ?Net IO Since Admission: 150 mL [04/13/21 0553] ? ?Physical Exam: ?General: Appears older than stated age. Patient is cachectic with poor dentition. No acute distress.  ?CV: RRR. No murmurs, rubs, or gallops.  ?Pulmonary: Lungs CTAB. Normal effort. No wheezing or rales. ?Abdominal: Soft with abdominal distension. Large umbilical hernia and right inguinal hernia.  ?Extremities: Palpable radial and DP pulses. 1+ bilateral LE edema. ?Skin: Warm and dry. No obvious rash or lesions. ?Neuro: A&Ox3. 3/5 strength RUE. 3/5 strength BLE. Pins and needles sensation in both hands ?Psych: Normal mood and affect ? ? ? ? ?ASSESSMENT/PLAN:  ?Assessment: ?Principal Problem: ?  Protein-calorie malnutrition ([place]) ?Active Problems: ?  Hypertension ?  Hepatic steatosis ? ? ?Plan: ?#Right upper extremity  weakness ?#Severe Malnutrition ?Acute cerebral infarction ruled out as the etiology of this patient's weakness. Weakness is more likely related to severe protein-calorie malnutrition. Vitamin D level very low at 6, potassium also low at 3.2. Vitamin B12 wnl (patient received B12 injections in the past) and B1 level in process. Will obtain further labs to evaluate for Multiple Myeloma as the cause of this patient's weakness.  ?- Vitamin D 50,000U weekly ?- Kclor 40 mEq x 2 doses ?- Echo still pending ?- B1 in process ?- SPEP and urine light chains ?- PT/OT/SLP ? ?#Hypertension ?BP well controlled off of medications. Will continue to monitor ?  ?#Transaminitis ?#Hepatic steatosis ?#Hypoalbuminemia ?Patient with AST/ALT elevation, although stable. This morning, AST/ALT 72/91 and albumin level low at 2.3. HIV and HCV Abs negative. INR slightly elevated at 1.4. Hypoalbumina is likely the cause of the patient's LE edema and DVTs were ruled out with ultrasounds.  ?- Ferritin, iron and TIBC to assess for hemochromatosis ?- Daily CMP ? ?#Prostatomegaly ?Prostatomegaly noted on CT. Will obtain PSA level to evaluate for possible prostate cancer as a source of his malnutrition. ?- PSA pending ? ?Best Practice: ?Diet: Dysphagia 2 ?IVF: Fluids: none ?VTE: enoxaparin (LOVENOX) injection 40 mg Start: 04/12/21 1730 ?Place TED hose Start: 04/11/21 1537 ?Code: Full ?AB: none ?Therapy Recs: Home Health, DME: other rolling walker ?Family Contact: Barnie Alderman, to be notified. ?DISPO: Anticipated discharge in 1-2 days to Home pending Medical stability. ? ?Signature: ?Makeba Delcastillo, D.O.  ?Internal Medicine Resident,  PGY-1 ?[facility]  ?Pager: [phone] ?5:53 AM, 04/13/2021  ? ?Please contact the on call pager after 5 pm and on weekends at [phone]. ? ?

## 2021-04-13 NOTE — Progress Notes (Signed)
STROKE TEAM PROGRESS NOTE   INTERVAL HISTORY No family at bedside.  Patient sitting bed, with both arms supporting his body, symmetrical on both upper extremity strength.  He denies smoking or cocaine this time but after I told him UDS positive for cocaine, he said he may just receive 1 cigarette from others containing cocaine.  He felt he is getting better and was able to eat more, eager to go home.  Vitals:   04/12/21 2326 04/13/21 0358 04/13/21 0828 04/13/21 1138  BP: 115/88 105/89 102/81 120/90  Pulse: 70 82 69 (!) 57  Resp: 20 17 18 17   Temp: 98.7 F (37.1 C) 97.6 F (36.4 C) 98.2 F (36.8 C) 98 F (36.7 C)  TempSrc: Oral Oral Oral   SpO2: 99% 98% 98% 100%   CBC:  Recent Labs  Lab 04/12/21 0345 04/13/21 1037  WBC 6.5 6.0  NEUTROABS  --  3.8  HGB 11.9* 11.0*  HCT 35.5* 31.4*  MCV 88.8 89.0  PLT 315 [id number]   Basic Metabolic Panel:  Recent Labs  Lab 04/12/21 0345 04/13/21 0103  NA 141 138  K 3.4* 3.2*  CL 108 107  CO2 24 23  GLUCOSE 65* 71  BUN 21* 25*  CREATININE 0.78 0.81  CALCIUM 8.2* 7.7*   Lipid Panel:  Recent Labs  Lab 04/12/21 0345  CHOL 75  TRIG 73  HDL 24*  CHOLHDL 3.1  VLDL 15  LDLCALC 36   HgbA1c:  Recent Labs  Lab 04/12/21 0345  HGBA1C 4.0*   Urine Drug Screen:  Recent Labs  Lab 04/12/21 0801  LABOPIA NONE DETECTED  COCAINSCRNUR POSITIVE*  LABBENZ POSITIVE*  AMPHETMU NONE DETECTED  THCU POSITIVE*  LABBARB NONE DETECTED    Alcohol Level No results for input(s): ETH in the last 168 hours.  IMAGING past 24 hours ECHOCARDIOGRAM COMPLETE  Result Date: 04/13/2021    ECHOCARDIOGRAM REPORT   Patient Name:   Logan Nelson Date of Exam: 04/13/2021 Medical Rec #:  [id number]            Height:       66.5 in Accession #:    [id number]           Weight:       142.0 lb Date of Birth:  [birth year]             BSA:          1.739 m Patient Age:    56 years             BP:           102/81 mmHg Patient Gender: M                    HR:            69 bpm. Exam Location:  Inpatient Procedure: 2D Echo Indications:    Stroke  History:        Patient has no prior history of Echocardiogram examinations.                 Arrythmias:Bradycardia, Signs/Symptoms:Shortness of Breath; Risk                 Factors:Hypertension.  Sonographer:    Arlyss Gandy Referring Phys: [id number] [place]  1. Left ventricular ejection fraction, by estimation, is 60 to 65%. The left ventricle has normal function. The left ventricle has no regional wall motion abnormalities. There is mild left ventricular hypertrophy. Left ventricular  diastolic parameters were normal.  2. Right ventricular systolic function is normal. The right ventricular size is normal. There is normal pulmonary artery systolic pressure. The estimated right ventricular systolic pressure is 123XX123 mmHg.  3. Left atrial size was mildly dilated.  4. Cannot exclude a flail A2 leaflet. The mitral valve is myxomatous. Moderate to severe mitral valve regurgitation. 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  5. The aortic valve is tricuspid. Aortic valve regurgitation is not visualized.  6. Aortic dilatation noted. There is borderline dilatation of the aortic root, measuring 38 mm.  7. The inferior vena cava is normal in size with greater than 50% respiratory variability, suggesting right atrial pressure of 3 mmHg. Comparison(s): No prior Echocardiogram. Changes from prior study are noted. 01/2021: Stress echo showed normal LVEF 55%, mild MR. Conclusion(s)/Recommendation(s): No intracardiac source of embolism detected on this transthoracic study. Consider a transesophageal echocardiogram to exclude cardiac source of embolism as well as to further evaluate the mitral valve and associated regurgitation. FINDINGS  Left Ventricle: Left ventricular ejection fraction, by estimation, is 60 to 65%. The left ventricle has normal function. The left ventricle  has no regional wall motion abnormalities. The left ventricular internal cavity size was normal in size. There is  mild left ventricular hypertrophy. Left ventricular diastolic parameters were normal. Right Ventricle: The right ventricular size is normal. No increase in right ventricular wall thickness. Right ventricular systolic function is normal. There is normal pulmonary artery systolic pressure. The tricuspid regurgitant velocity is 2.61 m/s, and  with an assumed right atrial pressure of 3 mmHg, the estimated right ventricular systolic pressure is 123XX123 mmHg. Left Atrium: Left atrial size was mildly dilated. Right Atrium: Right atrial size was normal in size. Pericardium: There is no evidence of pericardial effusion. Mitral Valve: Cannot exclude a flail A2 leaflet. 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 There is moderate thickening of the mitral valve leaflet(s). Moderate to severe mitral valve regurgitation, with posteriorly-directed jet. Tricuspid Valve: The tricuspid valve is grossly normal. Tricuspid valve regurgitation is trivial. Aortic Valve: The aortic valve is tricuspid. Aortic valve regurgitation is not visualized. Aortic valve mean gradient measures 2.0 mmHg. Aortic valve peak gradient measures 3.5 mmHg. Aortic valve area, by VTI measures 3.19 cm. Pulmonic Valve: The pulmonic valve was grossly normal. Pulmonic valve regurgitation is not visualized. Aorta: Aortic dilatation noted. There is borderline dilatation of the aortic root, measuring 38 mm. Venous: The inferior vena cava is normal in size with greater than 50% respiratory variability, suggesting right atrial pressure of 3 mmHg. IAS/Shunts: No atrial level shunt detected by color flow Doppler.  LEFT VENTRICLE PLAX 2D LVIDd:         4.50 cm   Diastology LVIDs:         3.10 cm   LV e' medial:    7.51 cm/s LV PW:         1.10 cm   LV E/e' medial:   12.0 LV IVS:        1.10 cm   LV e' lateral:   9.36 cm/s LVOT diam:     2.50 cm   LV E/e' lateral: 9.7 LV SV:         67 LV SV Index:   38 LVOT Area:     4.91 cm  RIGHT VENTRICLE RV Basal diam:  3.70 cm RV Mid diam:    3.10 cm RV S prime:     13.10 cm/s TAPSE (M-mode): 1.6 cm LEFT ATRIUM             Index        RIGHT ATRIUM           Index LA diam:        3.80 cm 2.19 cm/m   RA Area:     15.50 cm LA Vol (A2C):   67.0 ml 38.54 ml/m  RA Volume:   38.30 ml  22.03 ml/m LA Vol (A4C):   58.9 ml 33.88 ml/m LA Biplane Vol: 64.2 ml 36.93 ml/m  AORTIC VALVE AV Area (Vmax):    3.60 cm AV Area (Vmean):   3.11 cm AV Area (VTI):     3.19 cm AV Vmax:           93.70 cm/s AV Vmean:          68.900 cm/s AV VTI:            0.209 m AV Peak Grad:      3.5 mmHg AV Mean Grad:      2.0 mmHg LVOT Vmax:         68.70 cm/s LVOT Vmean:        43.700 cm/s LVOT VTI:          0.136 m LVOT/AV VTI ratio: 0.65  AORTA Ao Root diam: 3.80 cm Ao Asc diam:  3.60 cm MITRAL VALVE               TRICUSPID VALVE MV Area (PHT): 3.83 cm    TR Peak grad:   27.2 mmHg MV Decel Time: 198 msec    TR Vmax:        261.00 cm/s MV E velocity: 90.40 cm/s MV A velocity: 58.70 cm/s  SHUNTS MV E/A ratio:  1.54        Systemic VTI:  0.14 m                            Systemic Diam: 2.50 cm Lyman Bishop MD Electronically signed by Lyman Bishop MD Signature Date/Time: 04/13/2021/1:54:19 PM    Final    VAS [name] LOWER EXTREMITY VENOUS (DVT)  Result Date: 04/13/2021  Lower Venous DVT Study Patient Name:  Logan Nelson  Date of Exam:   04/13/2021 Medical Rec #: [id number]             Accession #:    [id number] Date of Birth: 02-15-1965              Patient Gender: M Patient Age:   36 years Exam Location:  [facility] Procedure:      VAS [name] LOWER EXTREMITY VENOUS (DVT) Referring Phys: Davonna Belling --------------------------------------------------------------------------------  Indications: Swelling.  Risk Factors: None identified. Comparison Study:  03/29/2021 - BILATERAL:                   - No evidence of deep vein thrombosis seen in the lower                   extremities,                   bilaterally.                   - No evidence of superficial venous thrombosis in the lower  extremities,                   bilaterally.                   -No evidence of popliteal cyst, bilaterally. Subcutaneous                   edema throughout                   bilateral lower extremities. Performing Technologist: Oliver Hum RVT  Examination Guidelines: A complete evaluation includes B-mode imaging, spectral Doppler, color Doppler, and power Doppler as needed of all accessible portions of each vessel. Bilateral testing is considered an integral part of a complete examination. Limited examinations for reoccurring indications may be performed as noted. The reflux portion of the exam is performed with the patient in reverse Trendelenburg.  +---------+---------------+---------+-----------+----------+--------------+  RIGHT     Compressibility Phasicity Spontaneity Properties Thrombus Aging  +---------+---------------+---------+-----------+----------+--------------+  CFV       Full            Yes       Yes                                    +---------+---------------+---------+-----------+----------+--------------+  SFJ       Full                                                             +---------+---------------+---------+-----------+----------+--------------+  FV Prox   Full                                                             +---------+---------------+---------+-----------+----------+--------------+  FV Mid    Full                                                             +---------+---------------+---------+-----------+----------+--------------+  FV Distal Full                                                             +---------+---------------+---------+-----------+----------+--------------+  PFV       Full                                                              +---------+---------------+---------+-----------+----------+--------------+  POP       Full            Yes       Yes                                    +---------+---------------+---------+-----------+----------+--------------+  PTV       Full                                                             +---------+---------------+---------+-----------+----------+--------------+  PERO      Full                                                             +---------+---------------+---------+-----------+----------+--------------+   +---------+---------------+---------+-----------+----------+--------------+  LEFT      Compressibility Phasicity Spontaneity Properties Thrombus Aging  +---------+---------------+---------+-----------+----------+--------------+  CFV       Full            Yes       Yes                                    +---------+---------------+---------+-----------+----------+--------------+  SFJ       Full                                                             +---------+---------------+---------+-----------+----------+--------------+  FV Prox   Full                                                             +---------+---------------+---------+-----------+----------+--------------+  FV Mid    Full                                                             +---------+---------------+---------+-----------+----------+--------------+  FV Distal Full                                                             +---------+---------------+---------+-----------+----------+--------------+  PFV       Full                                                             +---------+---------------+---------+-----------+----------+--------------+  POP       Full            Yes       Yes                                    +---------+---------------+---------+-----------+----------+--------------+  PTV       Full                                                              +---------+---------------+---------+-----------+----------+--------------+  PERO      Full                                                             +---------+---------------+---------+-----------+----------+--------------+     Summary: RIGHT: - There is no evidence of deep vein thrombosis in the lower extremity.  - No cystic structure found in the popliteal fossa.  LEFT: - There is no evidence of deep vein thrombosis in the lower extremity.  - No cystic structure found in the popliteal fossa.  *See table(s) above for measurements and observations. Electronically signed by Deitra Mayo MD on 04/13/2021 at 1:58:26 PM.    Final     PHYSICAL EXAM General:  Drowsy, thin-appearing patient in no acute distress, bilateral lower extremity edema noted Respiratory:  Regular, unlabored respirations on room air  NEURO:  Mental Status: AA&Ox3  Speech/Language: speech is without aphasia but mild dysarthria.    Cranial Nerves:  II: PERRL.  III, IV, VI: EOMI. Eyelids elevate symmetrically.  V: Sensation is intact to light touch  VII: Smile is symmetrical.  VIII: hearing intact to voice. IX, X: Phonation is normal.  XII: tongue is midline without fasciculations. Motor: 4/5 strength to BUE, 3/5 strength to BLE Sensation- Intact to light touch bilaterally with pins and needles sensation in bilateral hands Coordination: FTN shows bilateral mild dysmetria Gait- deferred   ASSESSMENT/PLAN Logan Nelson is a 56 y.o. male with history of HTN, abdominal wall hernia, GERD, stroke and B12 deficiency presenting with right sided weakness, leg swelling and bilateral arm dysmetria that had been present for about two days.  He did have a fall two days prior to admission due to these symptoms.  MRI reveals no acute abnormality.  Neurological manifestations from malnutrition and cachexia Exam showed whole body weakness and ataxia not out of proportion to weakness Significant malnutrition and cachexia,  recommend further work-up for malnutrition and vitamin deficiency CT head no acute abnormality, small, well-defined focus of hypoattenuation in right posterior pons consistent with old infarct CTA head & neck no LVO or significant stenosis MRI  motion degraded. No acute abnormality 2D Echo EF 60 to 65% LE venous Doppler no DVT LDL 36 HgbA1c 4.0 VTE prophylaxis -SCDs No antithrombotic prior to admission, now on aspirin 81 mg daily.  Continue on discharge given old pontine infarcts. Therapy recommendations:  CIR Disposition:  pending  Substance abuse Patient stated that he quit smoking 2 months ago Stated that remote alcohol abuse UDS positive for cocaine and THC Sensation education provided  Hypertension Home meds:  amlodipine 2.5 mg daily Stable Long-term BP goal normotensive  Other Stroke Risk Factors Former Cigarette smoker Hx stroke -CT showed chronic pontine infarct  Other Active Problems Bilateral lower extremity edema Lower extremity ultrasound no DVT Compression stockings Abdominal wall hernias Likely one of the source of malnutrition with GI symptoms  Hospital day # 0  Neurology will sign off. Please call with questions.  No neuro follow-up needed at this time.  Thanks for the consult.   Rosalin Hawking, MD PhD Stroke Neurology 04/13/2021 3:45 PM     To contact Stroke Continuity provider, please refer to [email or web address] After hours, contact General Neurology

## 2021-04-13 NOTE — Progress Notes (Signed)
Bilateral lower extremity venous duplex has been completed. ?Preliminary results can be found in CV Proc through chart review.  ? ?04/13/21 9:02 AM ?Olen Cordial RVT   ?

## 2021-04-14 ENCOUNTER — Other Ambulatory Visit (HOSPITAL_COMMUNITY): Payer: Self-pay

## 2021-04-14 DIAGNOSIS — E43 Unspecified severe protein-calorie malnutrition: Secondary | ICD-10-CM | POA: Insufficient documentation

## 2021-04-14 LAB — COMPREHENSIVE METABOLIC PANEL
ALT: 84 U/L — ABNORMAL HIGH (ref 0–44)
AST: 68 U/L — ABNORMAL HIGH (ref 15–41)
Albumin: 2.2 g/dL — ABNORMAL LOW (ref 3.5–5.0)
Alkaline Phosphatase: 104 U/L (ref 38–126)
Anion gap: 7 (ref 5–15)
BUN: 26 mg/dL — ABNORMAL HIGH (ref 6–20)
CO2: 20 mmol/L — ABNORMAL LOW (ref 22–32)
Calcium: 7.4 mg/dL — ABNORMAL LOW (ref 8.9–10.3)
Chloride: 107 mmol/L (ref 98–111)
Creatinine, Ser: 0.7 mg/dL (ref 0.61–1.24)
GFR, Estimated: >60 mL/min (ref >60)
Glucose, Bld: 75 mg/dL (ref 70–99)
Potassium: 3.7 mmol/L (ref 3.5–5.1)
Sodium: 134 mmol/L — ABNORMAL LOW (ref 135–145)
Total Bilirubin: 1 mg/dL (ref 0.3–1.2)
Total Protein: 4.9 g/dL — ABNORMAL LOW (ref 6.5–8.1)

## 2021-04-14 LAB — PREALBUMIN: Prealbumin: 7.8 mg/dL — ABNORMAL LOW (ref 18–38)

## 2021-04-14 LAB — VITAMIN B1: Vitamin B1 (Thiamine): 104.2 nmol/L (ref 66.5–200.0)

## 2021-04-14 MED ORDER — ADULT MULTIVITAMIN W/MINERALS CH: 1.0000 | ORAL_TABLET | Freq: Every day | ORAL | Status: DC

## 2021-04-14 MED ORDER — ONDANSETRON HCL 4 MG PO TABS
4.0000 mg | ORAL_TABLET | Freq: Three times a day (TID) | ORAL | 0 refills | Status: DC | PRN
  Filled 2021-04-14: qty 30, 10d supply, fill #0

## 2021-04-14 MED ORDER — CERTAVITE/ANTIOXIDANTS PO TABS
1.0000 | ORAL_TABLET | Freq: Every day | ORAL | 0 refills | Status: AC
  Filled 2021-04-14: qty 30, 30d supply, fill #0

## 2021-04-14 MED ORDER — ENSURE MAX PROTEIN PO LIQD
11.0000 [oz_av] | Freq: Two times a day (BID) | ORAL | Status: DC
  Filled 2021-04-14 (×2): qty 330

## 2021-04-14 MED ORDER — VITAMIN D (ERGOCALCIFEROL) 1.25 MG (50000 UNIT) PO CAPS: 50000.0000 [IU] | ORAL_CAPSULE | ORAL | 0 refills | Status: AC

## 2021-04-14 MED ORDER — ENSURE ENLIVE PO LIQD: 237.0000 mL | Freq: Three times a day (TID) | ORAL | Status: DC

## 2021-04-14 MED ORDER — ASPIRIN 81 MG PO CHEW
81.0000 mg | CHEWABLE_TABLET | Freq: Every day | ORAL | 0 refills | Status: AC
  Filled 2021-04-14: qty 30, 30d supply, fill #0

## 2021-04-14 MED ORDER — IOHEXOL 9 MG/ML PO SOLN
ORAL | Status: AC
  Filled 2021-04-14: qty 1000

## 2021-04-14 MED ORDER — ENSURE ENLIVE PO LIQD
237.0000 mL | Freq: Three times a day (TID) | ORAL | 12 refills | Status: DC
  Filled 2021-04-14: qty 237, 1d supply, fill #0

## 2021-04-14 NOTE — Progress Notes (Signed)
? ?HD#3 ?SUBJECTIVE:  ?Patient Summary: Logan Nelson is a 56 y.o. with a pertinent PMH of hypertension and abdominal wall hernias, who presented with right sided weakness and admitted for stroke rule out and found to have 60 lb unintentional weight loss and severe protein-calorie malnutrition.  ? ?Overnight Events: No acute events overnight  ? ?Interim History: Logan Nelson was seen and evaluated at the bedside this morning. He reports that he is having a lot of abdominal pain and continued nausea today. He has some vomiting after eating, but not consistently. He feels like his leg swelling is better.  ? ?OBJECTIVE:  ?Vital Signs: ?Vitals:  ? 04/14/21 0500 04/14/21 0818 04/14/21 0819 04/14/21 1244  ?BP: 96/76 123/88  108/90  ?Pulse: 60 (!) 55 (!) 55 79  ?Resp: 15 14  18   ?Temp: 97.7 ?F (36.5 ?C) 97.9 ?F (36.6 ?C)  97.9 ?F (36.6 ?C)  ?TempSrc: Oral Oral  Oral  ?SpO2: 97% 99% 98% 100%  ? ?Supplemental O2: Room Air ?SpO2: 100 % ? ?There were no vitals filed for this visit. ? ? ?Intake/Output Summary (Last 24 hours) at 04/14/2021 1249 ?Last data filed at 04/14/2021 0300 ?Gross per 24 hour  ?Intake --  ?Output 350 ml  ?Net -350 ml  ? ?Net IO Since Admission: -250 mL [04/14/21 1249] ? ?Physical Exam: ?General: Appears older than stated age. No acute distress, but appears uncomfortable. ?CV: RRR. No murmurs, rubs, or gallops. ?Pulmonary: Lungs CTAB. Normal effort.  ?Abdominal: Soft, distended. Large umbilical hernia is soft, but not reducible. Right inguinal hernia is large and soft, not reducible.  ?Extremities: Palpable radial and DP pulses. 1+ bilateral LE edema and dependent LE edema.  ?Skin: Warm and dry.  ?Neuro: A&Ox3. 3-4/5 bilateral upper and lower extremity strength.  ?Psych: Normal mood and affect ? ? ? ?ASSESSMENT/PLAN:  ?Assessment: ?Principal Problem: ?  Protein-calorie malnutrition (HCC) ?Active Problems: ?  Hypertension ?  Hepatic steatosis ?  Protein calorie malnutrition  (HCC) ? ? ?Plan: ?#Severe malnutrition ?#60 lb unintentional weight loss ?#Large umbilical hernia, rule out incarcerated hernia ?#Right inguinal hernia ?Patient was ~200 lbs in September and is now weighing about 140 lbs. Suspect that his weakness is secondary to severe malnutrition. Will continue to workup this patient's malnutrition, most concerning, we will need to rule out underlying malignancy. Patient also has 2 large hernias with surgery scheduled on 3/15, however, with his worsening abdominal pain today and continued nausea, we have asked general surgery to evaluate this patient. His exam is concerning for the possibility of an incarcerated hernia, as his hernias are not reducible and he is having worsening of his abdominal pain.  ?- CT chest/abd/pelvis pending to evaluate for malignancy and incarcerated hernia ?- General surgery consulted, appreciate recs ?- Continue Vitamin D 50,000 U weekly  ?- Will likely need outpatient EGD/colonoscopy  ? ?#Hypertension ?Remains stable off of medications. Continue to monitor  ? ?#Transaminitis ?#Hepatic steatosis ?#Hypoalbuminemia ?AST/ALT remains stable/slightly improved this morning. Ferritin level is elevated at 763, raising concern for possible hemochromatosis. Will need to evaluate this further in the outpatient setting. ? - Daily CMP ? ?#Prostatomegaly ?Prostatomegaly noted on CT. PSA level within normal limits at 1.3.  ? ?Best Practice: ?Diet: Dysphagia 2 ?IVF: Fluids: none ?VTE: enoxaparin (LOVENOX) injection 40 mg Start: 04/12/21 1730 ?Place TED hose Start: 04/11/21 1537 ?Code: Full ?AB: none ?Therapy Recs: Home Health, DME: other rolling walker ?Family Contact: 06/11/21, to be notified. ?DISPO: Anticipated discharge to Home pending Medical stability  and surgery evaluation . ? ?Signature: ?Elza Rafter, D.O.  ?Internal Medicine Resident, PGY-1 ?[facility]  ?Pager: [phone] ?12:49 PM, 04/14/2021  ? ?Please contact the on  call pager after 5 pm and on weekends at [phone]. ? ?

## 2021-04-14 NOTE — Consult Note (Addendum)
Logan Nelson 1965-10-28  [id number].    Requesting MD: Dr. Debe Coder Chief Complaint/Reason for Consult: Multiple Hernia's   HPI: Logan Nelson is a male with a hx of CVA (2005), HTN, GERD and b12 deficiency who presented to [facility] ED with right upper extremity weakness and dysmetria. He was admitted to internal medicine and neurology was consulted.  He underwent extensive work-up that was negative for CVA.  Neurologic manifestations were felt to be secondary to malnutrition and cachexia.  He is undergoing further work-up for malnutrition and vitamin deficiency (vitamin D level 6.29).  During admission patient complained of pain at his known ventral and R inguinal hernia sites has as well as intermittent nausea and vomiting.  Sounds like he has intermittent episodes of nausea and vomiting several hours after eating which last occurred this morning.  After receiving antinausea medication this morning he has been able to tolerate liquids without further episodes of emesis and his nausea has resolved.  He reports he always has pain at his hernia sites and this appears at baseline.  He continues to pass flatus.  Last BM yesterday.  He has a remote hx of colon resection and later colostomy takedown.  He since developed known ventral incisional hernias. Previously seen by Dr. Dossie Der in the office on 12/30/20 and was recommended for open incisional and right inguinal hernia repair with mesh which is tentatively planned for 04/21/21. Admission UDS positive for cocaine (and benzo's + THC). He denies any illicit drug use and is not sure how this ended up in his system. He initial reported he has not smoked in months but on further questioning reports he may have smoked a cigarette that could have had cocaine in it a month ago. His albumin is 2.2. He has a CT A/P pending.   ROS: Review of Systems  Constitutional:  Positive for weight loss. Negative for chills and fever.  Respiratory:   Negative for shortness of breath.   Cardiovascular:  Positive for leg swelling. Negative for chest pain.  Gastrointestinal:  Positive for abdominal pain, nausea and vomiting.  Psychiatric/Behavioral:  Positive for substance abuse.    No family history on file.  Past Medical History:  Diagnosis Date   Anemia    Arthritis    Bradycardia    Dyspnea    Falling episodes    GERD (gastroesophageal reflux disease)    Hernia of abdominal wall    Hypertension    Lower extremity edema     Past Surgical History:  Procedure Laterality Date   ABDOMINAL SURGERY      Social History:  reports that he quit smoking about a year ago. His smoking use included cigarettes. He has never used smokeless tobacco. He reports that he does not currently use alcohol. He reports that he does not currently use drugs.  Allergies: No Known Allergies  Medications Prior to Admission  Medication Sig Dispense Refill   amLODipine (NORVASC) 2.5 MG tablet Take 1 tablet (2.5 mg total) by mouth daily. (Patient not taking: Reported on 04/06/2021) 90 tablet 0   cyanocobalamin (,VITAMIN B-12,) 1000 MCG/ML injection Inject 1 mL Once a week for 4 weeks, then once a month thereafter. (Patient not taking: Reported on 04/06/2021) 10 mL 0   ondansetron (ZOFRAN-ODT) 8 MG disintegrating tablet Take 1 tablet (8 mg total) by mouth every 8 (eight) hours as needed for nausea or vomiting. (Patient not taking: Reported on 04/06/2021) 20 tablet 0   traMADol (ULTRAM) 50 MG  tablet Take 1 tablet (50 mg total) by mouth every 6 (six) hours as needed. (Patient not taking: Reported on 04/06/2021) 15 tablet 0     Physical Exam: Blood pressure 108/90, pulse 79, temperature 97.9 F (36.6 C), temperature source Oral, resp. rate 18, SpO2 100 %. General: Cachectic appearing male who is laying in bed in NAD HEENT: head is normocephalic, atraumatic.  Sclera are noninjected.  PERRL.  Ears and nose without any masses or lesions.  Mouth is pink and moist.  Dentition poor Lungs: Respiratory effort normal and nonlabored Abd: Soft, mild distension, NT, right inguinal hernia reducible but recurs. Multiple ventral hernia near umbilical that are soft and fully reducible. Prior incisional scars noted and healed.   MS: Trace bilateral lower extremity edema Skin: warm and dry with no masses, lesions, or rashes Psych: A&Ox4 with an appropriate affect Neuro: cranial nerves grossly intact, moves all extremities, able speech, thought process intact, gait not assessed   Results for orders placed or performed during the hospital encounter of 04/11/21 (from the past 48 hour(s))  HIV Antibody (routine testing w rflx)     Status: None   Collection Time: 04/12/21  2:03 PM  Result Value Ref Range   HIV Screen 4th Generation wRfx Non Reactive Non Reactive    Comment: Performed at [facility], [address], [place], [place] [address]  Hepatitis C antibody     Status: None   Collection Time: 04/12/21  2:03 PM  Result Value Ref Range   HCV Ab NON REACTIVE NON REACTIVE    Comment: (NOTE) Nonreactive HCV antibody screen is consistent with no HCV infections,  unless recent infection is suspected or other evidence exists to indicate HCV infection.  Performed at [facility], [address] [place], [place] [address]   Protime-INR     Status: Abnormal   Collection Time: 04/12/21  2:03 PM  Result Value Ref Range   Prothrombin Time 17.5 (H) 11.4 - 15.2 seconds   INR 1.4 (H) 0.8 - 1.2    Comment: (NOTE) INR goal varies based on device and disease states. Performed at [facility], [address], [place], [place] [address]   RPR     Status: None   Collection Time: 04/12/21  2:03 PM  Result Value Ref Range   RPR Ser Ql NON REACTIVE NON REACTIVE    Comment: Performed at [facility], [address], [place], [place] [address]  VITAMIN D 25 Hydroxy (Vit-D Deficiency, Fractures)     Status: Abnormal   Collection Time: 04/12/21  2:03  PM  Result Value Ref Range   Vit D, 25-Hydroxy 6.29 (L) 30 - 100 ng/mL    Comment: (NOTE) Vitamin D deficiency has been defined by the Institute of Medicine  and an Endocrine Society practice guideline as a level of serum 25-OH  vitamin D less than 20 ng/mL (1,2). The Endocrine Society went on to  further define vitamin D insufficiency as a level between 21 and 29  ng/mL (2).  1. IOM (Institute of Medicine). 2010. Dietary reference intakes for  calcium and D. [place]: [employer]. 2. Holick MF, Binkley [place], Bischoff-Ferrari HA, et al. Evaluation,  treatment, and prevention of vitamin D deficiency: an Endocrine  Society clinical practice guideline, JCEM. 2011 Jul; 96(7): 1911-30.  Performed at [facility], [address] [name], [place] [address]   Comprehensive metabolic panel     Status: Abnormal  Collection Time: 04/13/21  1:03 AM  Result Value Ref Range   Sodium 138 135 - 145 mmol/L   Potassium 3.2 (L) 3.5 - 5.1 mmol/L   Chloride 107 98 - 111 mmol/L   CO2 23 22 - 32 mmol/L   Glucose, Bld 71 70 - 99 mg/dL    Comment: Glucose reference range applies only to samples taken after fasting for at least 8 hours.   BUN 25 (H) 6 - 20 mg/dL   Creatinine, Ser 3.24 0.61 - 1.24 mg/dL   Calcium 7.7 (L) 8.9 - 10.3 mg/dL   Total Protein 5.0 (L) 6.5 - 8.1 g/dL   Albumin 2.3 (L) 3.5 - 5.0 g/dL   AST 72 (H) 15 - 41 U/L   ALT 91 (H) 0 - 44 U/L   Alkaline Phosphatase 112 38 - 126 U/L   Total Bilirubin 1.0 0.3 - 1.2 mg/dL   GFR, Estimated >40 >10 mL/min    Comment: (NOTE) Calculated using the CKD-EPI Creatinine Equation (2021)    Anion gap 8 5 - 15    Comment: Performed at [facility], [address], [place], [place] [address]  CBC with Differential/Platelet     Status: Abnormal   Collection Time: 04/13/21 10:37 AM  Result Value Ref Range   WBC 6.0 4.0 - 10.5 K/uL   RBC 3.53 (L) 4.22 - 5.81 MIL/uL   Hemoglobin 11.0 (L) 13.0 - 17.0 g/dL   HCT 66.4  (L) 40.3 - 52.0 %   MCV 89.0 80.0 - 100.0 fL   MCH 31.2 26.0 - 34.0 pg   MCHC 35.0 30.0 - 36.0 g/dL   RDW 47.4 (H) 25.9 - 56.3 %   Platelets 256 150 - 400 K/uL   nRBC 0.0 0.0 - 0.2 %   Neutrophils Relative % 63 %   Neutro Abs 3.8 1.7 - 7.7 K/uL   Lymphocytes Relative 28 %   Lymphs Abs 1.7 0.7 - 4.0 K/uL   Monocytes Relative 9 %   Monocytes Absolute 0.5 0.1 - 1.0 K/uL   Eosinophils Relative 0 %   Eosinophils Absolute 0.0 0.0 - 0.5 K/uL   Basophils Relative 0 %   Basophils Absolute 0.0 0.0 - 0.1 K/uL   Immature Granulocytes 0 %   Abs Immature Granulocytes 0.02 0.00 - 0.07 K/uL    Comment: Performed at [facility], [address] [place], [place] [address]  Iron and TIBC     Status: None   Collection Time: 04/13/21 10:37 AM  Result Value Ref Range   Iron 50 45 - 182 ug/dL   TIBC NOT CALCULATED 332 - 450 ug/dL   Saturation Ratios NOT CALCULATED 17.9 - 39.5 %   UIBC NOT CALCULATED ug/dL    Comment: Performed at [facility], [address] [place], [place] [address]  Ferritin     Status: Abnormal   Collection Time: 04/13/21 10:37 AM  Result Value Ref Range   Ferritin 763 (H) 24 - 336 ng/mL    Comment: Performed at [facility], [address], [place], [place] [address]  PSA     Status: None   Collection Time: 04/13/21 10:37 AM  Result Value Ref Range   Prostatic Specific Antigen 1.31 0.00 - 4.00 ng/mL    Comment: (NOTE) While PSA levels of <=4.0 ng/ml are reported as reference range, some men with levels below 4.0 ng/ml can have prostate cancer and many men with PSA above 4.0 ng/ml do not  have prostate cancer.  Other tests such as free PSA, age specific reference ranges, PSA velocity and PSA doubling time may be helpful especially in men less than 20 years old. Performed at [facility], [address], [place], [place] [address]   Comprehensive metabolic panel     Status: Abnormal   Collection Time: 04/14/21  1:58 AM  Result Value Ref Range    Sodium 134 (L) 135 - 145 mmol/L   Potassium 3.7 3.5 - 5.1 mmol/L   Chloride 107 98 - 111 mmol/L   CO2 20 (L) 22 - 32 mmol/L   Glucose, Bld 75 70 - 99 mg/dL    Comment: Glucose reference range applies only to samples taken after fasting for at least 8 hours.   BUN 26 (H) 6 - 20 mg/dL   Creatinine, Ser 6.04 0.61 - 1.24 mg/dL   Calcium 7.4 (L) 8.9 - 10.3 mg/dL   Total Protein 4.9 (L) 6.5 - 8.1 g/dL   Albumin 2.2 (L) 3.5 - 5.0 g/dL   AST 68 (H) 15 - 41 U/L   ALT 84 (H) 0 - 44 U/L   Alkaline Phosphatase 104 38 - 126 U/L   Total Bilirubin 1.0 0.3 - 1.2 mg/dL   GFR, Estimated >54 >09 mL/min    Comment: (NOTE) Calculated using the CKD-EPI Creatinine Equation (2021)    Anion gap 7 5 - 15    Comment: Performed at [facility], [address], [place], [place] [address]   ECHOCARDIOGRAM COMPLETE  Result Date: 04/13/2021    ECHOCARDIOGRAM REPORT   Patient Name:   Logan Nelson Date of Exam: 04/13/2021 Medical Rec #:  [id number]            Height:       66.5 in Accession #:    [id number]           Weight:       142.0 lb Date of Birth:  03-Nov-1965             BSA:          1.739 m Patient Age:    55 years             BP:           102/81 mmHg Patient Gender: M                    HR:           69 bpm. Exam Location:  Inpatient Procedure: 2D Echo Indications:    Stroke  History:        Patient has no prior history of Echocardiogram examinations.                 Arrythmias:Bradycardia, Signs/Symptoms:Shortness of Breath; Risk                 Factors:Hypertension.  Sonographer:    Devonne Doughty Referring Phys: [id number] JINDONG XU IMPRESSIONS  1. Left ventricular ejection fraction, by estimation, is 60 to 65%. The left ventricle has normal function. The left ventricle has no regional wall motion abnormalities. There is mild left ventricular hypertrophy. Left ventricular diastolic parameters were normal.  2. Right ventricular systolic function is normal. The right ventricular size is normal. There is  normal pulmonary artery systolic pressure. The estimated right ventricular systolic pressure is 30.2 mmHg.  3. Left atrial size was mildly dilated.  4. Cannot exclude a flail A2 leaflet. The mitral valve is  myxomatous. Moderate to severe mitral valve regurgitation. 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  5. The aortic valve is tricuspid. Aortic valve regurgitation is not visualized.  6. Aortic dilatation noted. There is borderline dilatation of the aortic root, measuring 38 mm.  7. The inferior vena cava is normal in size with greater than 50% respiratory variability, suggesting right atrial pressure of 3 mmHg. Comparison(s): No prior Echocardiogram. Changes from prior study are noted. 01/2021: Stress echo showed normal LVEF 55%, mild MR. Conclusion(s)/Recommendation(s): No intracardiac source of embolism detected on this transthoracic study. Consider a transesophageal echocardiogram to exclude cardiac source of embolism as well as to further evaluate the mitral valve and associated regurgitation. FINDINGS  Left Ventricle: Left ventricular ejection fraction, by estimation, is 60 to 65%. The left ventricle has normal function. The left ventricle has no regional wall motion abnormalities. The left ventricular internal cavity size was normal in size. There is  mild left ventricular hypertrophy. Left ventricular diastolic parameters were normal. Right Ventricle: The right ventricular size is normal. No increase in right ventricular wall thickness. Right ventricular systolic function is normal. There is normal pulmonary artery systolic pressure. The tricuspid regurgitant velocity is 2.61 m/s, and  with an assumed right atrial pressure of 3 mmHg, the estimated right ventricular systolic pressure is 30.2 mmHg. Left Atrium: Left atrial size was mildly dilated. Right Atrium: Right atrial size was normal in size. Pericardium: There is no evidence of  pericardial effusion. Mitral Valve: Cannot exclude a flail A2 leaflet. 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 There is moderate thickening of the mitral valve leaflet(s). Moderate to severe mitral valve regurgitation, with posteriorly-directed jet. Tricuspid Valve: The tricuspid valve is grossly normal. Tricuspid valve regurgitation is trivial. Aortic Valve: The aortic valve is tricuspid. Aortic valve regurgitation is not visualized. Aortic valve mean gradient measures 2.0 mmHg. Aortic valve peak gradient measures 3.5 mmHg. Aortic valve area, by VTI measures 3.19 cm. Pulmonic Valve: The pulmonic valve was grossly normal. Pulmonic valve regurgitation is not visualized. Aorta: Aortic dilatation noted. There is borderline dilatation of the aortic root, measuring 38 mm. Venous: The inferior vena cava is normal in size with greater than 50% respiratory variability, suggesting right atrial pressure of 3 mmHg. IAS/Shunts: No atrial level shunt detected by color flow Doppler.  LEFT VENTRICLE PLAX 2D LVIDd:         4.50 cm   Diastology LVIDs:         3.10 cm   LV e' medial:    7.51 cm/s LV PW:         1.10 cm   LV E/e' medial:  12.0 LV IVS:        1.10 cm   LV e' lateral:   9.36 cm/s LVOT diam:     2.50 cm   LV E/e' lateral: 9.7 LV SV:         67 LV SV Index:   38 LVOT Area:     4.91 cm  RIGHT VENTRICLE RV Basal diam:  3.70 cm RV Mid diam:    3.10 cm RV S prime:     13.10 cm/s TAPSE (M-mode): 1.6 cm LEFT ATRIUM             Index        RIGHT ATRIUM  Index LA diam:        3.80 cm 2.19 cm/m   RA Area:     15.50 cm LA Vol (A2C):   67.0 ml 38.54 ml/m  RA Volume:   38.30 ml  22.03 ml/m LA Vol (A4C):   58.9 ml 33.88 ml/m LA Biplane Vol: 64.2 ml 36.93 ml/m  AORTIC VALVE AV Area (Vmax):    3.60 cm AV Area (Vmean):   3.11 cm AV Area (VTI):     3.19 cm AV Vmax:           93.70 cm/s AV Vmean:          68.900 cm/s AV  VTI:            0.209 m AV Peak Grad:      3.5 mmHg AV Mean Grad:      2.0 mmHg LVOT Vmax:         68.70 cm/s LVOT Vmean:        43.700 cm/s LVOT VTI:          0.136 m LVOT/AV VTI ratio: 0.65  AORTA Ao Root diam: 3.80 cm Ao Asc diam:  3.60 cm MITRAL VALVE               TRICUSPID VALVE MV Area (PHT): 3.83 cm    TR Peak grad:   27.2 mmHg MV Decel Time: 198 msec    TR Vmax:        261.00 cm/s MV E velocity: 90.40 cm/s MV A velocity: 58.70 cm/s  SHUNTS MV E/A ratio:  1.54        Systemic VTI:  0.14 m                            Systemic Diam: 2.50 cm Zoila Shutter MD Electronically signed by Zoila Shutter MD Signature Date/Time: 04/13/2021/1:54:19 PM    Final    VAS [name] LOWER EXTREMITY VENOUS (DVT)  Result Date: 04/13/2021  Lower Venous DVT Study Patient Name:  Logan Nelson  Date of Exam:   04/13/2021 Medical Rec #: [id number]             Accession #:    [id number] Date of Birth: 03-Sep-1965              Patient Gender: M Patient Age:   65 years Exam Location:  [facility] Procedure:      VAS [name] LOWER EXTREMITY VENOUS (DVT) Referring Phys: Benjiman Core --------------------------------------------------------------------------------  Indications: Swelling.  Risk Factors: None identified. Comparison Study: 03/29/2021 - BILATERAL:                   - No evidence of deep vein thrombosis seen in the lower                   extremities,                   bilaterally.                   - No evidence of superficial venous thrombosis in the lower                   extremities,                   bilaterally.                   -No evidence of  popliteal cyst, bilaterally. Subcutaneous                   edema throughout                   bilateral lower extremities. Performing Technologist: Chanda Busing RVT  Examination Guidelines: A complete evaluation includes B-mode imaging, spectral Doppler, color Doppler, and power Doppler as needed of all accessible portions of each vessel. Bilateral testing is considered an  integral part of a complete examination. Limited examinations for reoccurring indications may be performed as noted. The reflux portion of the exam is performed with the patient in reverse Trendelenburg.  +---------+---------------+---------+-----------+----------+--------------+  RIGHT     Compressibility Phasicity Spontaneity Properties Thrombus Aging  +---------+---------------+---------+-----------+----------+--------------+  CFV       Full            Yes       Yes                                    +---------+---------------+---------+-----------+----------+--------------+  SFJ       Full                                                             +---------+---------------+---------+-----------+----------+--------------+  FV Prox   Full                                                             +---------+---------------+---------+-----------+----------+--------------+  FV Mid    Full                                                             +---------+---------------+---------+-----------+----------+--------------+  FV Distal Full                                                             +---------+---------------+---------+-----------+----------+--------------+  PFV       Full                                                             +---------+---------------+---------+-----------+----------+--------------+  POP       Full            Yes       Yes                                    +---------+---------------+---------+-----------+----------+--------------+  PTV  Full                                                             +---------+---------------+---------+-----------+----------+--------------+  PERO      Full                                                             +---------+---------------+---------+-----------+----------+--------------+   +---------+---------------+---------+-----------+----------+--------------+  LEFT      Compressibility Phasicity Spontaneity Properties Thrombus  Aging  +---------+---------------+---------+-----------+----------+--------------+  CFV       Full            Yes       Yes                                    +---------+---------------+---------+-----------+----------+--------------+  SFJ       Full                                                             +---------+---------------+---------+-----------+----------+--------------+  FV Prox   Full                                                             +---------+---------------+---------+-----------+----------+--------------+  FV Mid    Full                                                             +---------+---------------+---------+-----------+----------+--------------+  FV Distal Full                                                             +---------+---------------+---------+-----------+----------+--------------+  PFV       Full                                                             +---------+---------------+---------+-----------+----------+--------------+  POP       Full            Yes       Yes                                    +---------+---------------+---------+-----------+----------+--------------+  PTV       Full                                                             +---------+---------------+---------+-----------+----------+--------------+  PERO      Full                                                             +---------+---------------+---------+-----------+----------+--------------+     Summary: RIGHT: - There is no evidence of deep vein thrombosis in the lower extremity.  - No cystic structure found in the popliteal fossa.  LEFT: - There is no evidence of deep vein thrombosis in the lower extremity.  - No cystic structure found in the popliteal fossa.  *See table(s) above for measurements and observations. Electronically signed by Waverly Ferrari MD on 04/13/2021 at 1:58:26 PM.    Final     Anti-infectives (From admission, onward)    None        Assessment/Plan Ventral incisional hernias Right inguinal hernia - Patient known to our service with multiple abdominal wall/incisional hernias and right inguinal hernia. Previously seen by Dr. Dossie Der in the office on 12/30/20 and was recommended for open incisional and right inguinal hernia repair with mesh which is tentatively planned for 04/21/21.  We were called for pain at hernia sites as well as nausea/vomiting.  Patient is currently tolerating liquids and still having bowel function (passing flatus, BM yesterday). His hernia's are completely reducible but do recur.  He has a CT A/P pending. Will follow up on results. Unless there is an indication for emergency surgery, which I have a low suspicion for based on his history and exam, would not recommend any operative intervention during admission. Admission UDS positive for cocaine which would put him at increased risk for anesthesia. His albumin is 2.2 and it is unclear if he has quit smoking.  He will need to stop using illicit drugs, quit smoking and improve his nutrition before hernia repair to prevent his repair from failing. Recommend the medical team providing resources for quitting drugs and smoking. Will add ensure max to help with nutrition.    FEN - Dys2, IVF per primary  VTE - SCDs, Lovenox., ASA 81mg  ID - None  HTN Substance abuse  Neurological manifestations from malnutrition and cachexia Vitamin B12 deficiency Anemia Tobacco abuse   Medical Decision Making: High  I reviewed: Vitals: Last 24 hours Labs: CBC + CMP trend, admission UDS I/O: Last 48 hours Imaging: Abd xray from 3/5 was reviewed Provider Notes: EDP, IM, Nuerology Communication: Will secure chat primary team   Jacinto Halim, PA-C [facility] 04/14/2021, 1:34 PM Please see Amion for pager number during day hours 7:00am-4:30pm

## 2021-04-14 NOTE — Discharge Instructions (Signed)
Dear Logan Nelson, ? ?You were hospitalized because you felt weak, which is thought to be because of your low protein and nutrition. You did not have a stroke. Your low protein level is likely because of your abdominal pain from the hernias. To increase your protein, we recommend you get Ensure Enlive drinks, as this will help your nutritional status. You also should take a once a week vitamin D supplement, which we sent to your pharmacy, as well as a daily aspirin 81 mg.  ? ?Additionally, you will need to follow up with Dr. Renae Fickle [place] [phone]  regarding your upcoming surgery. We have also made you an appointment in our Internal Medicine Clinic on 3/15 at 3:15 PM. This is on the ground floor of the hospital.  ?

## 2021-04-14 NOTE — Progress Notes (Signed)
Physical Therapy Treatment ?Patient Details ?Name: Logan Nelson ?MRN: [id number] ?DOB: 28-May-1965 ?Today's Date: 04/14/2021 ? ? ?History of Present Illness 56 year old AA male presented to [facility] ED with c/o right sided weakness and pain as well as severe dysmetria or impairment to extremity coordination, he fell two days prior. CT Head with no acute intracranial abnormalities, MRI pending. PMHx: anemia, arthritis, dyspnea, GERD, HTN, LE edema ? ?  ?PT Comments  ? ? Pt admitted with above diagnosis. Pt was able to ambulate in hallway and tested balance quite a bit as well.  Pt ambulates well with RW without LOB with the RW. Scored 15/24 on DGI suggesting pt does need to use RW.  Will continue to progress pt as able.  Pt currently with functional limitations due to balance and endurance deficits. Pt will benefit from skilled PT to increase their independence and safety with mobility to allow discharge to the venue listed below.      ?Recommendations for follow up therapy are one component of a multi-disciplinary discharge planning process, led by the attending physician.  Recommendations may be updated based on patient status, additional functional criteria and insurance authorization. ? ?Follow Up Recommendations ? Home health PT ?  ?  ?Assistance Recommended at Discharge Intermittent Supervision/Assistance  ?Patient can return home with the following Assist for transportation ?  ?Equipment Recommendations ? Rolling walker (2 wheels)  ?  ?Recommendations for Other Services   ? ? ?  ?Precautions / Restrictions Precautions ?Precautions: Fall ?Restrictions ?Weight Bearing Restrictions: No  ?  ? ?Mobility ? Bed Mobility ?Overal bed mobility: Needs Assistance ?Bed Mobility: Supine to Sit, Sit to Sidelying ?  ?  ?Supine to sit: Min assist ?  ?Sit to sidelying: Supervision ?General bed mobility comments: Assist to elevate trunk into sitting. Pt back into bed frontwards onto all 4's and then down to side ?   ? ?Transfers ?Overall transfer level: Needs assistance ?Equipment used: Rolling walker (2 wheels) ?Transfers: Sit to/from Stand ?Sit to Stand: Min guard ?  ?  ?  ?  ?  ?General transfer comment: incr time ?  ? ?Ambulation/Gait ?Ambulation/Gait assistance: Min guard ?Gait Distance (Feet): 350 Feet ?Assistive device: Rolling walker (2 wheels) ?Gait Pattern/deviations: Step-through pattern, Decreased stride length, Trunk flexed ?Gait velocity: decr ?Gait velocity interpretation: <1.31 ft/sec, indicative of household ambulator ?  ?General Gait Details: Assist for safety to stay close to rW at times.  Able to withstand challenges to balance with RW. ? ? ?Stairs ?Stairs: Yes ?Stairs assistance: Min guard ?Stair Management: One rail Right, Step to pattern, Forwards, Two rails ?Number of Stairs: 3 ?General stair comments: Pt safe ascedning and descending stairs ? ? ?Wheelchair Mobility ?  ? ?Modified Rankin (Stroke Patients Only) ?  ? ? ?  ?Balance Overall balance assessment: Needs assistance ?Sitting-balance support: Feet supported, No upper extremity supported ?Sitting balance-Leahy Scale: Fair ?  ?  ?Standing balance support: Bilateral upper extremity supported, During functional activity ?Standing balance-Leahy Scale: Poor ?Standing balance comment: walker and min guard for static standing ?  ?  ?  ?  ?  ?  ?  ?  ?Standardized Balance Assessment ?Standardized Balance Assessment : Dynamic Gait Index ?  ?Dynamic Gait Index ?Level Surface: Mild Impairment ?Change in Gait Speed: Mild Impairment ?Gait with Horizontal Head Turns: Mild Impairment ?Gait with Vertical Head Turns: Mild Impairment ?Gait and Pivot Turn: Mild Impairment ?Step Over Obstacle: Mild Impairment ?Step Around Obstacles: Mild Impairment ?Steps: Moderate Impairment ?Total Score:  15 ?  ? ?  ?Cognition Arousal/Alertness: Awake/alert ?Behavior During Therapy: Flat affect ?Overall Cognitive Status: No family/caregiver present to determine baseline cognitive  functioning ?Area of Impairment: Attention, Memory, Following commands, Safety/judgement, Awareness, Problem solving ?  ?  ?  ?  ?  ?  ?  ?  ?Orientation Level: Disoriented to (pt not answering orientation questions) ?Current Attention Level: Selective ?Memory: Decreased recall of precautions ?Following Commands: Follows one step commands with increased time ?Safety/Judgement: Decreased awareness of safety ?Awareness: Intellectual ?Problem Solving: Slow processing, Decreased initiation, Requires verbal cues ?General Comments: unsure of cognitive issues vs participation issues ?  ?  ? ?  ?Exercises   ? ?  ?General Comments General comments (skin integrity, edema, etc.): VSS on RA, edema all extremities ?  ?  ? ?Pertinent Vitals/Pain Pain Assessment ?Pain Assessment: No/denies pain  ? ? ?Home Living   ?  ?  ?  ?  ?  ?  ?  ?  ?  ?   ?  ?Prior Function    ?  ?  ?   ? ?PT Goals (current goals can now be found in the care plan section) Acute Rehab PT Goals ?Patient Stated Goal: not stated ?Progress towards PT goals: Progressing toward goals ? ?  ?Frequency ? ? ? Min 3X/week ? ? ? ?  ?PT Plan Current plan remains appropriate  ? ? ?Co-evaluation   ?  ?  ?  ?  ? ?  ?AM-PAC PT "6 Clicks" Mobility   ?Outcome Measure ? Help needed turning from your back to your side while in a flat bed without using bedrails?: None ?Help needed moving from lying on your back to sitting on the side of a flat bed without using bedrails?: A Little ?Help needed moving to and from a bed to a chair (including a wheelchair)?: A Little ?Help needed standing up from a chair using your arms (e.g., wheelchair or bedside chair)?: A Little ?Help needed to walk in hospital room?: A Little ?Help needed climbing 3-5 steps with a railing? : A Lot ?6 Click Score: 18 ? ?  ?End of Session Equipment Utilized During Treatment: Gait belt ?Activity Tolerance: Other (comment);Patient limited by fatigue (self limiting) ?Patient left: with call bell/phone within reach;in  chair;with chair alarm set ?Nurse Communication: Mobility status ?PT Visit Diagnosis: Other abnormalities of gait and mobility (R26.89);Muscle weakness (generalized) (M62.81);History of falling (Z91.81) ?  ? ? ?Time: 3810-1751 ?PT Time Calculation (min) (ACUTE ONLY): 20 min ? ?Charges:  $Gait Training: 8-22 mins          ?          ? ?Talis Iwan M,PT ?Acute Rehab Services ?[phone] ?[phone] (pager)  ? ? ?Bevelyn Buckles ?04/14/2021, 12:23 PM ? ?

## 2021-04-14 NOTE — TOC Progression Note (Signed)
Transition of Care (TOC) - Progression Note  ? ? ?Patient Details  ?Name: Logan Nelson ?MRN: [id number] ?Date of Birth: [birth year] ? ?Transition of Care (TOC) CM/SW Contact  ?Lawerance Sabal, RN ?Phone Number: ?04/14/2021, 2:28 PM ? ?Clinical Narrative:    ?Spoke w patient at bedside. He is disappointed stating that his surgery is getting postponed again. ?We discussed his DC plan and he is living with his grandfather. He states that he has transportation from him and/ or a friend as needed.  ?Discussed HH services, charity provider unable to take, and he is agreeable to outpatient services, walking 350 feet with a walker, and RW will be delivered to the room today for home use. ?Discussed outpatient options and he would like [facility]. A referral has been placed electronically and he understands to call to get a faster appointment, information added to AVS.  ?Patient states he has a ride for DC, please send any scripts through [facility] pharmacy so they can be sent home w patient.  ? ? ? ?Expected Discharge Plan: Home/Self Care ?Barriers to Discharge: Continued Medical Work up ? ?Expected Discharge Plan and Services ?Expected Discharge Plan: Home/Self Care ?  ?Discharge Planning Services: CM Consult ?Post Acute Care Choice: Durable Medical Equipment ?Living arrangements for the past 2 months: Single Family Home ?                ?  ?  ?  ?  ?  ?  ?  ?  ?  ?  ? ? ?Social Determinants of Health (SDOH) Interventions ?  ? ?Readmission Risk Interventions ?No flowsheet data found. ? ?

## 2021-04-14 NOTE — Progress Notes (Signed)
Occupational Therapy Treatment ?Patient Details ?Name: Logan Nelson ?MRN: [id number] ?DOB: Oct 07, 1965 ?Today's Date: 04/14/2021 ? ? ?History of present illness 56 year old AA male presented to [facility] ED with c/o right sided weakness and pain as well as severe dysmetria or impairment to extremity coordination, he fell two days prior. CT Head with no acute intracranial abnormalities, MRI pending. PMHx: anemia, arthritis, dyspnea, GERD, HTN, LE edema ?  ?OT comments ? Logan Nelson is progressing well with improved ability to complete BADLs and functional mobility. Pt reported general discomfort in abdomen, and frustration due to recent medical news (no sx). Pt complete BADLs (dressing, grooming, toileting) with min guard-supervision A, he was slow and deliberate to move and reaching for external surfaces for support. Encouraged use of RW, pt ignoring. Pt verbalized want to d/c home today. Recommend 24/7 direct supervision A for d/c home.   ? ?Recommendations for follow up therapy are one component of a multi-disciplinary discharge planning process, led by the attending physician.  Recommendations may be updated based on patient status, additional functional criteria and insurance authorization. ?   ?Follow Up Recommendations ? Home health OT  ?  ?Assistance Recommended at Discharge Frequent or constant Supervision/Assistance  ?Patient can return home with the following ? A little help with walking and/or transfers;A little help with bathing/dressing/bathroom;Assistance with cooking/housework;Assist for transportation;Help with stairs or ramp for entrance ?  ?Equipment Recommendations ? Tub/shower seat  ?  ?Recommendations for Other Services   ? ?  ?Precautions / Restrictions Precautions ?Precautions: Fall ?Restrictions ?Weight Bearing Restrictions: No  ? ? ?  ? ?Mobility Bed Mobility ?Overal bed mobility: Needs Assistance ?  ?  ?  ?  ?  ?  ?General bed mobility comments: pt sitting EOB upon arrival ?   ? ?Transfers ?Overall transfer level: Needs assistance ?Equipment used: Rolling walker (2 wheels) ?Transfers: Sit to/from Stand ?Sit to Stand: Min guard, Supervision ?  ?  ?  ?  ?  ?General transfer comment: incr time ?  ?  ?Balance Overall balance assessment: Needs assistance ?Sitting-balance support: Feet supported, No upper extremity supported ?Sitting balance-Leahy Scale: Fair ?  ?  ?Standing balance support: During functional activity, Single extremity supported ?Standing balance-Leahy Scale: Fair ?Standing balance comment: pt furniture walking ?  ?  ?  ?  ?  ?  ?  ?  ?  ?  ?  ?   ? ?ADL either performed or assessed with clinical judgement  ? ?ADL Overall ADL's : Needs assistance/impaired ?  ?  ?Grooming: Standing;Supervision/safety ?  ?  ?  ?  ?  ?Upper Body Dressing : Set up;Sitting ?  ?Lower Body Dressing: Supervision/safety;Sit to/from stand ?  ?Toilet Transfer: Supervision/safety;Ambulation ?Toilet Transfer Details (indicate cue type and reason): no AD despite encouragement. pt reaching for external support ?  ?  ?  ?  ?Functional mobility during ADLs: Supervision/safety;Rolling walker (2 wheels) ?General ADL Comments: ADLs to prepare for home. overall no physical assist required, benefitted from cues fro safety. Pt agitated about recent news, ignorign safety cues and AD use. ?  ? ?Extremity/Trunk Assessment Upper Extremity Assessment ?Upper Extremity Assessment: Overall WFL for tasks assessed ?RUE Deficits / Details: improved ROM this session, using functionally. increased edema noted ?LUE Deficits / Details: Full ROM, strength is generally 4/5, weaker distally vs proximal. increased edema noted ?  ?Lower Extremity Assessment ?Lower Extremity Assessment: Defer to PT evaluation ?  ?  ?  ? ?Vision   ?Vision Assessment?: No apparent visual  deficits ?  ?Perception Perception ?Perception: Within Functional Limits ?  ?Praxis Praxis ?Praxis: Intact ?  ? ?Cognition Arousal/Alertness: Awake/alert ?Behavior During  Therapy: Flat affect ?Overall Cognitive Status: No family/caregiver present to determine baseline cognitive functioning ?Area of Impairment: Attention, Memory, Following commands, Safety/judgement, Awareness, Problem solving ?  ?  ?  ?  ?  ?  ?  ?  ?Orientation Level: Disoriented to ?Current Attention Level: Selective ?Memory: Decreased recall of precautions ?Following Commands: Follows one step commands with increased time ?Safety/Judgement: Decreased awareness of safety ?Awareness: Intellectual ?Problem Solving: Slow processing, Decreased initiation, Requires verbal cues ?General Comments: agitated 2/2 news of no urgent surgery, and MD concern for nutritional intake. Pt adamant on going home. ?  ?  ?   ?   ?   ?General Comments VSS on RA, edema in all extremities  ? ? ?Pertinent Vitals/ Pain       Pain Assessment ?Pain Assessment: Faces ?Faces Pain Scale: Hurts a little bit ?Pain Location: abdomen ?Pain Descriptors / Indicators: Discomfort ?Pain Intervention(s): Limited activity within patient's tolerance, Monitored during session ? ? ?Frequency ? Min 2X/week  ? ? ? ? ?  ?Progress Toward Goals ? ?OT Goals(current goals can now be found in the care plan section) ? Progress towards OT goals: Progressing toward goals ? ?Acute Rehab OT Goals ?Patient Stated Goal: home today ?OT Goal Formulation: With patient ?Time For Goal Achievement: 04/26/21 ?Potential to Achieve Goals: Good ?ADL Goals ?Pt Will Perform Grooming: with modified independence;standing ?Pt Will Perform Lower Body Dressing: with modified independence;sit to/from stand ?Pt Will Transfer to Toilet: with modified independence;ambulating ?Pt/caregiver will Perform Home Exercise Program: Increased ROM;Increased strength;Both right and left upper extremity;With written HEP provided  ?Plan Discharge plan needs to be updated   ? ?   ?AM-PAC OT "6 Clicks" Daily Activity     ?Outcome Measure ? ? Help from another person eating meals?: A Little ?Help from another  person taking care of personal grooming?: A Little ?Help from another person toileting, which includes using toliet, bedpan, or urinal?: A Little ?Help from another person bathing (including washing, rinsing, drying)?: A Lot ?Help from another person to put on and taking off regular upper body clothing?: A Little ?Help from another person to put on and taking off regular lower body clothing?: A Lot ?6 Click Score: 16 ? ?  ?End of Session Equipment Utilized During Treatment: Rolling walker (2 wheels);Gait belt ? ?OT Visit Diagnosis: Unsteadiness on feet (R26.81);Other abnormalities of gait and mobility (R26.89);Muscle weakness (generalized) (M62.81);History of falling (Z91.81);Hemiplegia and hemiparesis ?Hemiplegia - Right/Left: Right ?Hemiplegia - dominant/non-dominant: Dominant ?Hemiplegia - caused by: Unspecified ?  ?Activity Tolerance Patient tolerated treatment well ?  ?Patient Left in bed;with call bell/phone within reach ?  ?Nurse Communication Mobility status ?  ? ?   ? ?Time: 1400-1420 ?OT Time Calculation (min): 20 min ? ?Charges: OT General Charges ?$OT Visit: 1 Visit ?OT Treatments ?$Self Care/Home Management : 8-22 mins ? ? ?Brianah Hopson A Katarina Riebe ?04/14/2021, 3:59 PM ?

## 2021-04-14 NOTE — Progress Notes (Signed)
Initial Nutrition Assessment ? ?DOCUMENTATION CODES:  ? ?Severe malnutrition in context of chronic illness ? ?INTERVENTION:  ?MVI with minerals daily ?Ensure Enlive po TID, each supplement provides 350 kcal and 20 grams of protein. ?Mighty Shake TID with meals, each supplement provides 330 kcals and 9 grams of protein ?HS snack  ?Request weight this admission  ?Encourage adequate PO intake  ? ?NUTRITION DIAGNOSIS:  ? ?Severe Malnutrition related to chronic illness (chronic nausea and vomiting) as evidenced by energy intake < or equal to 75% for > or equal to 1 month, percent weight loss, severe fat depletion, severe muscle depletion (30% weight loss within the last 6 months). ? ? ?GOAL:  ? ?Patient will meet greater than or equal to 90% of their needs ? ? ?MONITOR:  ? ?PO intake, Supplement acceptance, Labs, Weight trends ? ?REASON FOR ASSESSMENT:  ? ?Consult ?Assessment of nutrition requirement/status, Poor PO ? ?ASSESSMENT:  ? ? Pt is a 56 year old male with past medical history significant of HTN, GERD, abdominal wall hernia, anemia, lower extremity edema, prior stroke in 2005, who presented to the ED with right-sided weakness with concern for new stroke and was admitted for stroke rule out and found to have neural foraminal narrowing at C4-C5 and weakness related to severe protein-calorie malnutrition.  ? ?Pt is currently on a Dysphagia 2 diet with no meal completions documented at this time.  ? ?Met with pt at bedside. Per pt, pt endorses a fair appetite today and reports that he ate 100% of his breakfast that consisted of eggs, sausage, oatmeal, coffee and juice. Pt denies any vomiting today and tolerated breakfast well today. Per pt, pt complains of chronic nausea and vomiting. Pt had a trash can nearby and a vomit bag during visit. Pt reports that they have been giving him nausea medication since being in the hospital and this has helped with his nausea/vomiting symptoms. Prior to admission, pt reports a  poor appetite and poor PO intake due to chronic N/V and reports that he been dealing with this for years. Pt also states that he occasionally has swallowing difficulties. Pt reports that for the last few months he has been eating bland foods such as pasta and rice. Per pt, he lives with family and uses a cane to ambulate at baseline. Pt reports that he is working with social work to try to get a walker. Pt complains of pain in legs due to swelling and states that the swelling has gone down since admission. Pt reports that he has been losing weight and this has been ongoing for several years. Pt unable to provide UBW due to consistent weight loss over time.  ? ?Discussed with pt the importance of adequate PO intake to maintain energy/strength and to meet nutritional needs. Encouraged pt to consume consistent meals throughout the day and the use of oral nutrition supplements to aid in caloric and protein intake. Pt agreeable to trying Ensure (chocolate-flavored). Discussed MVI supplementation with pt in the case of vitamin and mineral deficiencies and pt agreeable to taking a MVI. Discussed providing HS snack for pt and pt interested in trying a tuna salad sandwich on white bread.  ? ?Current wt: 64.4 kg  ?Pt's weight on 03/29/21 was 73 kg. Pt has experienced a 12% weight loss within the last 17 days. Per pt's weight history, pt's weight on 10/12/20 was 92 kg and pt has experienced a 30% weight loss within the last 6 months, which is significant for time frame.  Per chart review, pt is being further evaluated for possible myeloma or prostate CA.  ? ? ?Scheduled Meds: ? Vitamin D (Ergocalciferol)  50,000 Units Oral Q7 days  ? ?PRN Meds: senna-docusate  ? ?Labs reviewed and include:  ?Na: 134, BUN: 26, Corrected calcium: 8.8, Vitamin D: 6.29 (low), Vitamin B12: 929 ? ?Noted Vitamin D is low and replacement already ordered.  ? ? ?NUTRITION - FOCUSED PHYSICAL EXAM: ? ?Flowsheet Row Most Recent Value  ?Orbital Region Severe  depletion  ?Upper Arm Region Severe depletion  ?Thoracic and Lumbar Region Severe depletion  ?Buccal Region Severe depletion  ?Temple Region Severe depletion  ?Clavicle Bone Region Severe depletion  ?Clavicle and Acromion Bone Region Severe depletion  ?Scapular Bone Region Severe depletion  ?Dorsal Hand Unable to assess  [d/t edema of both hands]  ?Patellar Region Severe depletion  ?Anterior Thigh Region Severe depletion  ?Posterior Calf Region Severe depletion  ?Edema (RD Assessment) Moderate  ?Hair Reviewed  ?Eyes Reviewed  ?Mouth Reviewed  [missing teeth]  ?Skin Reviewed  ?Nails Reviewed  ? ?  ? ? ?Diet Order:   ?Diet Order   ? ?       ?  DIET DYS 2 Room service appropriate? Yes; Fluid consistency: Thin  Diet effective now       ?  ? ?  ?  ? ?  ? ? ?EDUCATION NEEDS:  ? ?Education needs have been addressed ? ?Skin:  Skin Assessment: Reviewed RN Assessment ? ?Last BM:  3/6; type 6 ? ?Height:  ? ?Ht Readings from Last 1 Encounters:  ?04/09/21 5' 6.5" (1.689 m)  ? ? ?Weight:  ? ?Wt Readings from Last 1 Encounters:  ?04/09/21 64.4 kg  ? ? ?BMI:  There is no height or weight on file to calculate BMI. ? ?Estimated Nutritional Needs:  ? ?Kcal:  2050 - 2250 ? ?Protein:  100 - 115 grams ? ?Fluid:  >/= 2 L ? ? ? ?Maryruth Hancock, Dietetic Intern ?04/14/2021 2:29 PM ?

## 2021-04-14 NOTE — Progress Notes (Signed)
OT Cancellation Note ? ?Patient Details ?Name: Logan Nelson ?MRN: [id number] ?DOB: Sep 26, 1965 ? ? ?Cancelled Treatment:    Reason Eval/Treat Not Completed: Patient declined, no reason specified (Pt stating, "I just woke up, give me a minute." Continues to decline despite encouragement. OT treatment to f/u as appropriate.) ? ?Bryahna Lesko A Nikka Hakimian ?04/14/2021, 10:26 AM ?

## 2021-04-14 NOTE — Discharge Summary (Signed)
Name: Logan Nelson MRN: [id number] DOB: Jun 12, 1965 56 y.o. PCP: [name], MD  Date of Admission: 04/11/2021 10:10 AM Date of Discharge:  04/14/2021 Attending Physician: Dr. Dareen Piano  DISCHARGE DIAGNOSIS:  Primary Problem: Protein-calorie malnutrition [facility])   Hospital Problems: Principal Problem:   Protein-calorie malnutrition ([place]) Active Problems:   Hypertension   Hepatic steatosis   Protein calorie malnutrition ([place])   Protein-calorie malnutrition, severe    DISCHARGE MEDICATIONS:   Allergies as of 04/14/2021   No Known Allergies      Medication List     STOP taking these medications    amLODipine 2.5 MG tablet Commonly known as: NORVASC   cyanocobalamin 1000 MCG/ML injection Commonly known as: (VITAMIN B-12)   ondansetron 8 MG disintegrating tablet Commonly known as: ZOFRAN-ODT   traMADol 50 MG tablet Commonly known as: ULTRAM       TAKE these medications    aspirin 81 MG chewable tablet Chew 1 tablet (81 mg total) by mouth daily.   CertaVite/Antioxidants Tabs Take 1 tablet by mouth daily.   feeding supplement Liqd Take 237 mLs by mouth 3 (three) times daily between meals.   ondansetron 4 MG tablet Commonly known as: Zofran Take 1 tablet (4 mg total) by mouth every 8 (eight) hours as needed for nausea or vomiting.   Vitamin D (Ergocalciferol) 1.25 MG (50000 UNIT) Caps capsule Commonly known as: DRISDOL Take 1 capsule (50,000 Units total) by mouth every 7 (seven) days. Start taking on: April 20, 2021               Durable Medical Equipment  (From admission, onward)           Start     Ordered   04/14/21 1426  For home use only DME Walker rolling  Once       Question Answer Comment  Walker: With 5 Inch Wheels   Patient needs a walker to treat with the following condition Weakness      04/14/21 1426            DISPOSITION AND FOLLOW-UP:  Logan Nelson was discharged from [facility]  in [place] condition. At the hospital follow up visit please address:  Poor nutritional status: Recommended to drink Ensure Enlive to increase protein intake. Poor nutritional status was likely the cause of this patient's weakness. Will also discharge with [facility] PT/OT.  Hernias: Poor po intake/nutrition is likely secondary to significant abdominal hernias causing abd pain and nausea. Recommend follow up with general surgery and repeat CT imaging, as we attempted to do in the hospital.  Unintentional weight loss: Patient declined CT chest/abd/pelvis while hospitalized- would recommend this to evaluate for underlying malignancy. Also will need outpatient EGD/colonoscopy to complete this workup  Follow-up Recommendations: Consults: General surgery Labs: Comprehensive Metabolic Panel Studies: CT Chest/Abd Pelvis; EGD/colonoscopy (to evaluate for underlying malignancy) Medications: Vitamin D 50,000U weekly  Aspirin 81 mg daily   Follow-up Appointments:  Follow-up Information     Medicaid. Call.   Contact information: [email or web address] or call [phone]        [facility] Follow up on 05/12/2021.   Why: Your appointment is with Dr Margarita Rana at 2:30 pm. Please arrive early and bring a photo ID and your current medications Contact information: [address] [place], [place] [address]  [phone]        [facility] Follow up.   Specialty: Rehabilitation Why: For physical therapy. Call to schedule  an appointment. A referreal has been placed electronically for you. Contact information: [address] [id number] [place] [address] [place]        Lajean Manes, MD .   Specialty: Internal Medicine Contact information: [place] [address] [place]:  Patient Summary: #Right upper extremity weakness, stroke rule out #Severe  malnutrition #60 lb unintentional weight loss #Large umbilical hernia, rule out incarcerated hernia #Right inguinal hernia Patient presented with 2 days of RUE weakness, as well as generalized lower extremity weakness. On initial neuro exam, the patient has 2-3/5 RUE strength and has abnormal finger to nose testing, concerning for an infarction. CT Head with no acute intracranial abnormalities but did not a small hypoattenuation in the R posterior pons consistent with an old lacunar infarct, although patient does not remember ever being told he had a stroke. 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 MRI cervical spine showed moderate neural foraminal narrowing at multiple levels, although this is likely not the cause of the patient's weakness. Weakness is more likely related to the patient's overall severe malnutrition. A1c 4.0, TSH wnl, and LDL 36- no need for statin therapy initiation at this time. Vitamin D level was very low at 6 and the patient was started on vitamin D 50,000U weekly. He is recommended to drink Ensure Enlive po TID and Mighty Shake TID with meals. Additionally, patient has unintentionally lost ~60 lbs since September. Patient was ~200 lbs in September and is now weighing about 140 lbs. Patient also has 2 large hernias that have caused him severe nausea (which could be contributing to his weight loss) with surgery scheduled on 3/15, however, with his worsening abdominal pain today and continued nausea, we asked general surgery to evaluate this patient. They have noted that with his smoking history and drug use, as well as his poor nutritional state, that he would not be a good candidate for surgery  and does not require it emergently. Patient was strongly recommended to get a CT Chest/Abd/Pelvis to evaluate his hernias further (and to rule out incarcerated hernia), as well as to evaluate for some underlying malignancy that could be contributing to his weight loss, however, he refused this. He adamantly wanted to be discharged to home without the CT scans, although we recommended he stay to get them and could be discharged after completion of the scans. Will recommend he continue the workup for unintentional weight loss as an outpatient. He will likely need a CT chest/abd/pelvis and an outpatient EGD/colonoscopy. CT chest should be obtained to rule out lung cancer, given his history of tobacco use. PSA was within normal limits, making prostate cancer less likely for him.   #Hx of hypertension Previously on amlodipine 2.5 mg daily, however, patient states that someone took him off of this medicine and he does not know why. BP normotensive off medications.    #Bilateral lower extremity edema L>R Patient with bilateral lower extremity edema, worse on the left than the right. The patient states that this has been causing pain in his legs and has given him trouble ambulating. He is unsure when the swelling  started. He denies any hx of blood clots that he knows of, any recent surgeries, or any recent travel. Unlikely to be related to heart failure as the patient had a normal echo in December. Bilateral LE ultrasound ruled out DVT. Suspect that LE edema is secondary to hypoalbuminemia.   #Transaminitis #Hepatic steatosis #Hypoalbuminemia Patient with AST/ALT elevation, although stable. This morning, AST/ALT 72/91 and albumin level low at 2.3. HIV and HCV Abs negative. INR slightly elevated at 1.4. Hypoalbumina is likely the cause of the patient's LE edema and DVTs were ruled out with ultrasounds.  Ferritin level is elevated at 763, raising concern for possible hemochromatosis. Will need to evaluate this  further in the outpatient setting.  #Hx of B12 deficiency Patient previously treated with weekly B12 injections.   #Large umbilical hernia #Large right inguinal hernia Patient has followed with [facility] for his hernias. He has had significant abdominal pain and nausea. Plan for open incisional hernia repair and right inguinal hernia repair with mesh later this month, on 3/15. Patient will need to follow up with [facility] regarding this, as he was noted to not be a candidate for surgery given his poor nutritional status.    DISCHARGE INSTRUCTIONS:   Discharge Instructions     Ambulatory referral to Occupational Therapy   Complete by: As directed    Ambulatory referral to Physical Therapy   Complete by: As directed    Diet - low sodium heart healthy   Complete by: As directed    Face-to-face encounter (required for Medicare/Medicaid patients)   Complete by: As directed    I Zedric Deroy N Devlin Brink certify that this patient is under my care and that I, or a nurse practitioner or physician's assistant working with me, had a face-to-face encounter that meets the physician face-to-face encounter requirements with this patient on 04/14/2021. The encounter with the patient was in whole, or in part for the following medical condition(s) which is the primary reason for home health care (List medical condition): deconditioning   The encounter with the patient was in whole, or in part, for the following medical condition, which is the primary reason for home health care: deconditioning   I certify that, based on my findings, the following services are medically necessary home health services: Physical therapy   Reason for Medically Necessary Home Health Services: Therapy- Therapeutic Exercises to Increase Strength and Endurance   My clinical findings support the need for the above services: Unsafe ambulation due to balance issues   Further, I certify that my clinical findings support that  this patient is homebound due to: Unsafe ambulation due to balance issues   Home Health   Complete by: As directed    To provide the following care/treatments: PT   Increase activity slowly   Complete by: As directed       Dear Logan Nelson,  You were hospitalized because you felt weak, which is thought to be because of your low protein and nutrition. You did not have a stroke. Your low protein level is likely because of your abdominal pain from the hernias. To increase your protein, we recommend you get Ensure Enlive drinks, as this will help your nutritional status. You also should take a once a week vitamin D supplement, which we sent to your pharmacy, as well as a daily aspirin 81 mg.   Additionally, you will need to follow up with Dr. Eddie Dibbles [place] [phone]  regarding your upcoming surgery. We have also made you  an appointment in our [facility] on 3/15 at 3:15 PM. This is on the ground floor of the hospital.   SUBJECTIVE:  Patient was seen and evaluated at the bedside. He is still having abdominal pain and nausea and is upset that the surgeons will not repair his hernias while he is inpatient. He continues to have poor appetite because of his pain. Leg swelling is improved.   Discharge Vitals:   BP 115/86 (BP Location: Right Arm)    Pulse (!) 59    Temp 98.3 F (36.8 C) (Oral)    Resp 16    SpO2 100%   OBJECTIVE:   General: Appears older than stated age. No acute distress, but appears uncomfortable. CV: RRR. No murmurs, rubs, or gallops. Pulmonary: Lungs CTAB. Normal effort.  Abdominal: Soft, distended. Large umbilical hernia is soft, but not reducible. Right inguinal hernia is large and soft, not reducible.  Extremities: Palpable radial and DP pulses. 1+ bilateral LE edema and dependent LE edema.  Skin: Warm and dry.  Neuro: A&Ox3. 3-4/5 bilateral upper and lower extremity strength.  Psych: Normal mood and affect    Pertinent Labs, Studies, and  Procedures:  CBC Latest Ref Rng & Units 04/13/2021 04/12/2021 04/11/2021  WBC 4.0 - 10.5 K/uL 6.0 6.5 5.6  Hemoglobin 13.0 - 17.0 g/dL 11.0(L) 11.9(L) 11.6(L)  Hematocrit 39.0 - 52.0 % 31.4(L) 35.5(L) 35.7(L)  Platelets 150 - 400 K/uL 256 315 334    CMP Latest Ref Rng & Units 04/14/2021 04/13/2021 04/12/2021  Glucose 70 - 99 mg/dL 75 71 65(L)  BUN 6 - 20 mg/dL 26(H) 25(H) 21(H)  Creatinine 0.61 - 1.24 mg/dL 0.70 0.81 0.78  Sodium 135 - 145 mmol/L 134(L) 138 141  Potassium 3.5 - 5.1 mmol/L 3.7 3.2(L) 3.4(L)  Chloride 98 - 111 mmol/L 107 107 108  CO2 22 - 32 mmol/L 20(L) 23 24  Calcium 8.9 - 10.3 mg/dL 7.4(L) 7.7(L) 8.2(L)  Total Protein 6.5 - 8.1 g/dL 4.9(L) 5.0(L) 5.8(L)  Total Bilirubin 0.3 - 1.2 mg/dL 1.0 1.0 1.0  Alkaline Phos 38 - 126 U/L 104 112 125  AST 15 - 41 U/L 68(H) 72(H) 69(H)  ALT 0 - 44 U/L 84(H) 91(H) 97(H)    CT ANGIO HEAD NECK W WO CM  Result Date: 04/11/2021 CLINICAL DATA:  Neuro deficit, acute, stroke suspected. EXAM: CT ANGIOGRAPHY HEAD AND NECK TECHNIQUE: Multidetector CT imaging of the head and neck was performed using the standard protocol during bolus administration of intravenous contrast. Multiplanar CT image reconstructions and MIPs were obtained to evaluate the vascular anatomy. Carotid stenosis measurements (when applicable) are obtained utilizing NASCET criteria, using the distal internal carotid diameter as the denominator. RADIATION DOSE REDUCTION: This exam was performed according to the departmental dose-optimization program which includes automated exposure control, adjustment of the mA and/or kV according to patient size and/or use of iterative reconstruction technique. CONTRAST:  180mL OMNIPAQUE IOHEXOL 350 MG/ML SOLN COMPARISON:  Same day head CT 04/11/2021. FINDINGS: CTA NECK FINDINGS Aortic arch: Standard aortic branching. No hemodynamically significant innominate or proximal subclavian artery stenosis. Right carotid system: CCA and ICA patent within the neck  without stenosis or significant atherosclerotic disease. Left carotid system: CCA and ICA patent within the neck without stenosis or significant atherosclerotic disease. Vertebral arteries: Vertebral arteries patent within the neck without stenosis or significant atherosclerotic disease. The left vertebral artery is dominant. Skeleton: Reversal of the expected cervical lordosis. No acute bony abnormality or aggressive osseous lesion. Other neck: No appreciable  neck mass or cervical lymphadenopathy. Upper chest: No consolidation within the imaged lung apices. Review of the MIP images confirms the above findings CTA HEAD FINDINGS Anterior circulation: The intracranial internal carotid arteries are patent. The M1 middle cerebral arteries are patent. No M2 proximal branch occlusion or high-grade proximal stenosis is identified. The anterior cerebral arteries are patent. No intracranial aneurysm is identified. Posterior circulation: The intracranial vertebral arteries are patent. The basilar artery is patent. The posterior cerebral arteries are patent. Posterior communicating arteries are present bilaterally. Venous sinuses: The dural venous sinuses are poorly assessed due to contrast timing. Anatomic variants: None significant. Review of the MIP images confirms the above findings IMPRESSION: CTA neck: The common carotid, internal carotid and vertebral arteries are patent within the neck without stenosis. CTA head: No intracranial large vessel occlusion or proximal high-grade arterial stenosis. Electronically Signed   By: Kellie Simmering D.O.   On: 04/11/2021 12:45   DG Chest 2 View  Result Date: 04/11/2021 CLINICAL DATA:  SOB EXAM: CHEST - 2 VIEW COMPARISON:  October 13, 2020 FINDINGS: The cardiomediastinal silhouette is unchanged in contour. No pleural effusion. No pneumothorax. No acute pleuroparenchymal abnormality. Gaseous distension of bowel beneath the hemidiaphragms. Multilevel degenerative changes of the  thoracic spine. Limited assessment on lateral radiograph secondary to arm positioning. IMPRESSION: No acute cardiopulmonary abnormality. Electronically Signed   By: Valentino Saxon M.D.   On: 04/11/2021 11:26   DG Abd 1 View  Result Date: 04/11/2021 CLINICAL DATA:  MRI clearance.  Ventral hernia. EXAM: ABDOMEN - 1 VIEW COMPARISON:  Abdominopelvic CT 12/17/2020 FINDINGS: No visualized radiopaque foreign body or implanted medical device. Excreted IV contrast within both renal collecting systems in the urinary bladder. Gaseous distention of bowel in the upper abdomen with small-bowel distention of at least 6.3 cm. Additional bowel distension may be colonic. Right abdominal wall appears vertebra in, suboptimally assessed on this frontal view. IMPRESSION: 1. No visualized radiopaque foreign body or implanted medical device to preclude MRI imaging. 2. Gaseous distention of bowel in the upper abdomen with small-bowel distention of at least 6.3 cm. Additional bowel distension may be colonic. Electronically Signed   By: Keith Rake M.D.   On: 04/11/2021 17:44   CT HEAD WO CONTRAST (5MM)  Result Date: 04/11/2021 CLINICAL DATA:  Concern for stroke a few days ago with dropping things out of the patient's right hand. Also reports blunt trauma to the face. EXAM: CT HEAD WITHOUT CONTRAST CT MAXILLOFACIAL WITHOUT CONTRAST TECHNIQUE: Multidetector CT imaging of the head and maxillofacial structures were performed using the standard protocol without intravenous contrast. Multiplanar CT image reconstructions of the maxillofacial structures were also generated. RADIATION DOSE REDUCTION: This exam was performed according to the departmental dose-optimization program which includes automated exposure control, adjustment of the mA and/or kV according to patient size and/or use of iterative reconstruction technique. COMPARISON:  03/14/2003 FINDINGS: CT HEAD FINDINGS Brain: No evidence of acute infarction, hemorrhage,  hydrocephalus, extra-axial collection or mass lesion/mass effect. Small well-defined focus of hypoattenuation in the right posterior pons, new from the prior CT, consistent with an old lacunar infarct. Vascular: No hyperdense vessel or unexpected calcification. Skull: Normal. Negative for fracture or focal lesion. Other: None. CT MAXILLOFACIAL FINDINGS Osseous: No acute fracture or mandibular dislocation. No destructive process. Old fracture of the medial left orbital wall. Orbits: Negative. No traumatic or inflammatory finding. Sinuses: Clear. Soft tissues: No soft tissue contusion or hematoma. IMPRESSION: HEAD CT 1. No acute intracranial abnormalities. 2. Small well-defined focus of  hypoattenuation in the right posterior pons consistent with an old lacunar infarct, new since 2005. MAXILLOFACIAL CT 1. No acute fracture or acute finding. Electronically Signed   By: Lajean Manes M.D.   On: 04/11/2021 12:41   MR BRAIN WO CONTRAST  Addendum Date: 04/11/2021   ADDENDUM REPORT: 04/11/2021 20:06 ADDENDUM: Incidental note is made of a small T1 hypointense, T2 hyperintense lesion at midline posterior to the pituitary gland in the sella, which is somewhat better visualized on the same-day MRI of the cervical spine. This lesion measures up to 4 x 4 x 4 mm (series 11, image 10 and series 7, image 12). 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 Electronically Signed   By: Merilyn Baba M.D.   On: 04/11/2021 20:06   Result Date: 04/11/2021 CLINICAL DATA:  Stroke suspected EXAM: MRI HEAD WITHOUT CONTRAST TECHNIQUE: Multiplanar, multiecho pulse sequences of the brain and surrounding structures were obtained without intravenous contrast. COMPARISON:  No prior MRI, correlation is made with 06/11/2021 CT head and CTA head and neck FINDINGS: Evaluation is somewhat limited by motion artifact. Brain: No restricted diffusion to suggest acute or subacute infarct. No acute hemorrhage, mass,  mass effect, or midline shift. No hydrocephalus or extra-axial collection. Scattered T2 hyperintense signal in the periventricular white matter, likely the sequela of chronic small vessel ischemic disease. Lacunar infarct in the right pons. Vascular: Normal flow voids. Skull and upper cervical spine: Normal marrow signal. Subcutaneous lesions in the left parietal scalp (series 9, image 18), possibly epidermal inclusion cysts. Sinuses/Orbits: Remote left lamina papyracea fracture. The orbits and paranasal sinuses are otherwise unremarkable. Other: Trace fluid in right mastoid air cells. IMPRESSION: Evaluation is limited by motion artifact. Within this limitation, no acute intracranial process. Electronically Signed: By: Merilyn Baba M.D. On: 04/11/2021 19:54   MR CERVICAL SPINE WO CONTRAST  Result Date: 04/11/2021 CLINICAL DATA:  Myelopathy, acute EXAM: MRI CERVICAL SPINE WITHOUT CONTRAST TECHNIQUE: Multiplanar, multisequence MR imaging of the cervical spine was performed. No intravenous contrast was administered. COMPARISON:  No prior MRI of the cervical spine, correlation is made with 06/11/2021 CTA head and neck FINDINGS: Evaluation is somewhat limited by motion artifact. Alignment: Reversal of the normal cervical lordosis. No significant listhesis. Vertebrae: No acute fracture or suspicious osseous lesion. Cord: Normal signal and morphology. Posterior Fossa, vertebral arteries, paraspinal tissues: Lacunar infarct in the right pons, better evaluated on the same-day MRI the head. 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 Disc levels: C2-C3: Mild disc bulge. No spinal canal stenosis. Facet arthropathy. Mild right neural foraminal narrowing. C3-C4: Mild disc bulge with small right paracentral disc protrusion. Uncovertebral and facet arthropathy. No spinal canal stenosis. Mild bilateral neural foraminal narrowing. C4-C5: Mild disc bulge with small central disc protrusion,  which indents the ventral spinal cord. No spinal canal stenosis. Uncovertebral and facet arthropathy. Mild-to-moderate bilateral neural foraminal narrowing. C5-C6: No definite disc bulge. No spinal canal stenosis. Uncovertebral and facet arthropathy. Mild right neural foraminal narrowing. C6-C7: No significant disc bulge. No spinal canal stenosis or neuroforaminal narrowing. C7-T1: No significant disc bulge. No spinal canal stenosis or neuroforaminal narrowing. IMPRESSION: Evaluation is limited by motion artifact. Within this limitation, no definite spinal canal stenosis and up to mild-to-moderate neural foraminal narrowing, at C4-C5, as described above. Electronically Signed   By: Merilyn Baba M.D.   On: 04/11/2021 20:15   CT Maxillofacial Wo Contrast  Result Date: 04/11/2021 CLINICAL DATA:  Concern for stroke a few days ago with dropping things out of the patient's right hand. Also reports blunt trauma to the face. EXAM: CT HEAD WITHOUT CONTRAST CT MAXILLOFACIAL WITHOUT CONTRAST TECHNIQUE: Multidetector CT imaging of the head and maxillofacial structures were performed using the standard protocol without intravenous contrast. Multiplanar CT image reconstructions of the maxillofacial structures were also generated. RADIATION DOSE REDUCTION: This exam was performed according to the departmental dose-optimization program which includes automated exposure control, adjustment of the mA and/or kV according to patient size and/or use of iterative reconstruction technique. COMPARISON:  03/14/2003 FINDINGS: CT HEAD FINDINGS Brain: No evidence of acute infarction, hemorrhage, hydrocephalus, extra-axial collection or mass lesion/mass effect. Small well-defined focus of hypoattenuation in the right posterior pons, new from the prior CT, consistent with an old lacunar infarct. Vascular: No hyperdense vessel or unexpected calcification. Skull: Normal. Negative for fracture or focal lesion. Other: None. CT MAXILLOFACIAL  FINDINGS Osseous: No acute fracture or mandibular dislocation. No destructive process. Old fracture of the medial left orbital wall. Orbits: Negative. No traumatic or inflammatory finding. Sinuses: Clear. Soft tissues: No soft tissue contusion or hematoma. IMPRESSION: HEAD CT 1. No acute intracranial abnormalities. 2. Small well-defined focus of hypoattenuation in the right posterior pons consistent with an old lacunar infarct, new since 2005. MAXILLOFACIAL CT 1. No acute fracture or acute finding. Electronically Signed   By: Lajean Manes M.D.   On: 04/11/2021 12:41     Signed: Buddy Duty, D.O.  Internal Medicine Resident, PGY-1 [facility]  Pager: [phone] 4:33 PM, 04/14/2021

## 2021-04-15 ENCOUNTER — Telehealth: Payer: Self-pay

## 2021-04-15 LAB — PROTEIN ELECTROPHORESIS, SERUM
A/G Ratio: 1 (ref 0.7–1.7)
Albumin ELP: 2.6 g/dL — ABNORMAL LOW (ref 2.9–4.4)
Alpha-1-Globulin: 0.2 g/dL (ref 0.0–0.4)
Alpha-2-Globulin: 0.4 g/dL (ref 0.4–1.0)
Beta Globulin: 0.6 g/dL — ABNORMAL LOW (ref 0.7–1.3)
Gamma Globulin: 1.4 g/dL (ref 0.4–1.8)
Globulin, Total: 2.6 g/dL (ref 2.2–3.9)
Total Protein ELP: 5.2 g/dL — ABNORMAL LOW (ref 6.0–8.5)

## 2021-04-15 NOTE — Telephone Encounter (Signed)
Transition Care Management Unsuccessful Follow-up Telephone Call ? ?Date of discharge and from where:  04/14/2021, [facility]  ? ?Attempts:  1st Attempt ? ?Reason for unsuccessful TCM follow-up call:  Unable to leave message - # [phone], just rings, no option to leave a message.  ? ?Patient has appointment with Dr Alvis Lemmings 05/12/2021 at [facility]. Need to inquire if he would like to change appointment to follow up with his PCP- Dr Andrey Campanile @ [facility] ? ? ? ?

## 2021-04-16 ENCOUNTER — Telehealth: Payer: Self-pay

## 2021-04-16 NOTE — Telephone Encounter (Signed)
Transition Care Management Follow-up Telephone Call ?  ?Date of discharge and from where:[facility] 04/14/2021 ?How have you been since you were released from the hospital? ok ?Any questions or concerns? No questions/concerns reported.  ?Items Reviewed: ?Did the pt receive and understand the discharge instructions provided? Has the instructions and have no questions.  ?Medications obtained and verified? he said he have the medication list  and the hospital staff reviewed them in detail prior to discharge. He said that he has all of the medications and have no questions.  ?Any new allergies since your discharge? None reported  ?Do you have support at home? Yes, granddaughter ?Other (ie: DME, Home Health, etc)      ? None ?Functional Questionnaire: (I = Independent and D = Dependent) ?ADL's:  Independent.    ?  ?  ?Follow up appointments reviewed: ?  ??PCP Hospital f/u appt confirmed? MD Redmond Pulling 04/23/2021 1050.  ??[facility] f/u appt confirmed? None scheduled at this time  ??Are transportation arrangements needed? have transportation   ??If their condition worsens, is the pt aware to call  their PCP or go to the ED? yes ??Was the patient provided with contact information for the PCP's office or ED? He has the phone number  ??Was the pt encouraged to call back with questions or concerns?yes ?

## 2021-04-21 ENCOUNTER — Inpatient Hospital Stay (HOSPITAL_COMMUNITY): Admission: RE | Admit: 2021-04-21 | Payer: Self-pay | Source: Ambulatory Visit | Admitting: Surgery

## 2021-04-21 ENCOUNTER — Ambulatory Visit: Payer: Self-pay | Admitting: Internal Medicine

## 2021-04-21 ENCOUNTER — Encounter (HOSPITAL_COMMUNITY): Admission: RE | Payer: Self-pay | Source: Ambulatory Visit

## 2021-04-21 SURGERY — REPAIR, HERNIA, INCISIONAL
Anesthesia: General | Laterality: Right

## 2021-04-23 ENCOUNTER — Other Ambulatory Visit: Payer: Self-pay

## 2021-04-23 ENCOUNTER — Ambulatory Visit: Payer: Self-pay | Admitting: Family Medicine

## 2021-04-23 ENCOUNTER — Inpatient Hospital Stay (HOSPITAL_COMMUNITY)
Admission: AD | Admit: 2021-04-23 | Discharge: 2021-05-05 | DRG: 335 | Discharge status: Against Medical Advice | Payer: Medicaid Other | Source: Ambulatory Visit | Attending: General Surgery | Admitting: General Surgery

## 2021-04-23 ENCOUNTER — Inpatient Hospital Stay: Payer: Self-pay

## 2021-04-23 DIAGNOSIS — Z7982 Long term (current) use of aspirin: Secondary | ICD-10-CM

## 2021-04-23 DIAGNOSIS — K4090 Unilateral inguinal hernia, without obstruction or gangrene, not specified as recurrent: Secondary | ICD-10-CM | POA: Diagnosis not present

## 2021-04-23 DIAGNOSIS — N400 Enlarged prostate without lower urinary tract symptoms: Secondary | ICD-10-CM | POA: Diagnosis present

## 2021-04-23 DIAGNOSIS — J95821 Acute postprocedural respiratory failure: Secondary | ICD-10-CM | POA: Diagnosis not present

## 2021-04-23 DIAGNOSIS — I10 Essential (primary) hypertension: Secondary | ICD-10-CM | POA: Diagnosis not present

## 2021-04-23 DIAGNOSIS — D638 Anemia in other chronic diseases classified elsewhere: Secondary | ICD-10-CM | POA: Diagnosis present

## 2021-04-23 DIAGNOSIS — K430 Incisional hernia with obstruction, without gangrene: Secondary | ICD-10-CM | POA: Diagnosis present

## 2021-04-23 DIAGNOSIS — K4030 Unilateral inguinal hernia, with obstruction, without gangrene, not specified as recurrent: Secondary | ICD-10-CM | POA: Diagnosis present

## 2021-04-23 DIAGNOSIS — Z5329 Procedure and treatment not carried out because of patient's decision for other reasons: Secondary | ICD-10-CM | POA: Diagnosis present

## 2021-04-23 DIAGNOSIS — J9601 Acute respiratory failure with hypoxia: Secondary | ICD-10-CM | POA: Diagnosis not present

## 2021-04-23 DIAGNOSIS — K660 Peritoneal adhesions (postprocedural) (postinfection): Secondary | ICD-10-CM | POA: Diagnosis present

## 2021-04-23 DIAGNOSIS — R634 Abnormal weight loss: Secondary | ICD-10-CM | POA: Diagnosis present

## 2021-04-23 DIAGNOSIS — D62 Acute posthemorrhagic anemia: Secondary | ICD-10-CM | POA: Diagnosis not present

## 2021-04-23 DIAGNOSIS — G9341 Metabolic encephalopathy: Secondary | ICD-10-CM | POA: Diagnosis present

## 2021-04-23 DIAGNOSIS — Z20822 Contact with and (suspected) exposure to covid-19: Secondary | ICD-10-CM | POA: Diagnosis present

## 2021-04-23 DIAGNOSIS — D509 Iron deficiency anemia, unspecified: Secondary | ICD-10-CM | POA: Diagnosis present

## 2021-04-23 DIAGNOSIS — K420 Umbilical hernia with obstruction, without gangrene: Secondary | ICD-10-CM | POA: Diagnosis not present

## 2021-04-23 DIAGNOSIS — K439 Ventral hernia without obstruction or gangrene: Secondary | ICD-10-CM | POA: Diagnosis not present

## 2021-04-23 DIAGNOSIS — Z5900 Homelessness unspecified: Secondary | ICD-10-CM

## 2021-04-23 DIAGNOSIS — R231 Pallor: Secondary | ICD-10-CM | POA: Diagnosis present

## 2021-04-23 DIAGNOSIS — F17200 Nicotine dependence, unspecified, uncomplicated: Secondary | ICD-10-CM | POA: Diagnosis present

## 2021-04-23 DIAGNOSIS — K4010 Bilateral inguinal hernia, with gangrene, not specified as recurrent: Secondary | ICD-10-CM | POA: Diagnosis not present

## 2021-04-23 DIAGNOSIS — K219 Gastro-esophageal reflux disease without esophagitis: Secondary | ICD-10-CM | POA: Diagnosis present

## 2021-04-23 DIAGNOSIS — K4020 Bilateral inguinal hernia, without obstruction or gangrene, not specified as recurrent: Principal | ICD-10-CM

## 2021-04-23 DIAGNOSIS — R739 Hyperglycemia, unspecified: Secondary | ICD-10-CM | POA: Diagnosis not present

## 2021-04-23 DIAGNOSIS — I9581 Postprocedural hypotension: Secondary | ICD-10-CM | POA: Diagnosis present

## 2021-04-23 DIAGNOSIS — E43 Unspecified severe protein-calorie malnutrition: Secondary | ICD-10-CM | POA: Diagnosis present

## 2021-04-23 DIAGNOSIS — E876 Hypokalemia: Secondary | ICD-10-CM | POA: Diagnosis not present

## 2021-04-23 DIAGNOSIS — D649 Anemia, unspecified: Secondary | ICD-10-CM | POA: Diagnosis not present

## 2021-04-23 DIAGNOSIS — Z79899 Other long term (current) drug therapy: Secondary | ICD-10-CM

## 2021-04-23 LAB — COMPREHENSIVE METABOLIC PANEL
ALT: 64 U/L — ABNORMAL HIGH (ref 0–44)
AST: 46 U/L — ABNORMAL HIGH (ref 15–41)
Albumin: 2 g/dL — ABNORMAL LOW (ref 3.5–5.0)
Alkaline Phosphatase: 95 U/L (ref 38–126)
Anion gap: 7 (ref 5–15)
BUN: 11 mg/dL (ref 6–20)
CO2: 24 mmol/L (ref 22–32)
Calcium: 7.2 mg/dL — ABNORMAL LOW (ref 8.9–10.3)
Chloride: 107 mmol/L (ref 98–111)
Creatinine, Ser: 0.53 mg/dL — ABNORMAL LOW (ref 0.61–1.24)
GFR, Estimated: >60 mL/min (ref >60)
Glucose, Bld: 75 mg/dL (ref 70–99)
Potassium: 2.5 mmol/L — CL (ref 3.5–5.1)
Sodium: 138 mmol/L (ref 135–145)
Total Bilirubin: 0.8 mg/dL (ref 0.3–1.2)
Total Protein: 5 g/dL — ABNORMAL LOW (ref 6.5–8.1)

## 2021-04-23 LAB — FERRITIN: Ferritin: 831 ng/mL — ABNORMAL HIGH (ref 24–336)

## 2021-04-23 LAB — LIPID PANEL
Cholesterol: 62 mg/dL (ref 0–200)
HDL: 26 mg/dL — ABNORMAL LOW (ref >40)
LDL Cholesterol: 26 mg/dL (ref 0–99)
Total CHOL/HDL Ratio: 2.4 RATIO
Triglycerides: 48 mg/dL (ref <150)
VLDL: 10 mg/dL (ref 0–40)

## 2021-04-23 LAB — MAGNESIUM: Magnesium: 1.4 mg/dL — ABNORMAL LOW (ref 1.7–2.4)

## 2021-04-23 LAB — PHOSPHORUS: Phosphorus: 2.5 mg/dL (ref 2.5–4.6)

## 2021-04-23 LAB — RESP PANEL BY RT-PCR (FLU A&B, COVID) ARPGX2
Influenza A by PCR: NEGATIVE
Influenza B by PCR: NEGATIVE
SARS Coronavirus 2 by RT PCR: NEGATIVE

## 2021-04-23 LAB — VITAMIN D 25 HYDROXY (VIT D DEFICIENCY, FRACTURES): Vit D, 25-Hydroxy: 8.19 ng/mL — ABNORMAL LOW (ref 30–100)

## 2021-04-23 LAB — CBC
HCT: 26.1 % — ABNORMAL LOW (ref 39.0–52.0)
Hemoglobin: 8.8 g/dL — ABNORMAL LOW (ref 13.0–17.0)
MCH: 30.3 pg (ref 26.0–34.0)
MCHC: 33.7 g/dL (ref 30.0–36.0)
MCV: 90 fL (ref 80.0–100.0)
Platelets: 252 10*3/uL (ref 150–400)
RBC: 2.9 MIL/uL — ABNORMAL LOW (ref 4.22–5.81)
RDW: 15.8 % — ABNORMAL HIGH (ref 11.5–15.5)
WBC: 4.4 10*3/uL (ref 4.0–10.5)
nRBC: 0 % (ref 0.0–0.2)

## 2021-04-23 LAB — PROTIME-INR
INR: 1.2 (ref 0.8–1.2)
Prothrombin Time: 15.6 seconds — ABNORMAL HIGH (ref 11.4–15.2)

## 2021-04-23 LAB — FOLATE: Folate: 33.3 ng/mL (ref >5.9)

## 2021-04-23 LAB — PREALBUMIN: Prealbumin: 5.5 mg/dL — ABNORMAL LOW (ref 18–38)

## 2021-04-23 MED ORDER — POTASSIUM CHLORIDE 10 MEQ/100ML IV SOLN
10.0000 meq | INTRAVENOUS | Status: AC
  Administered 2021-04-23 – 2021-04-24 (×5): 10 meq via INTRAVENOUS
  Filled 2021-04-23 (×6): qty 100

## 2021-04-23 MED ORDER — POTASSIUM PHOSPHATES 15 MMOLE/5ML IV SOLN
15.0000 mmol | Freq: Once | INTRAVENOUS | Status: AC
  Administered 2021-04-24: 15 mmol via INTRAVENOUS
  Filled 2021-04-23: qty 5

## 2021-04-23 MED ORDER — ENSURE PRE-SURGERY PO LIQD
296.0000 mL | Freq: Once | ORAL | Status: DC
  Filled 2021-04-23: qty 296

## 2021-04-23 MED ORDER — ORAL CARE MOUTH RINSE
15.0000 mL | Freq: Two times a day (BID) | OROMUCOSAL | Status: DC
  Administered 2021-04-24 – 2021-05-04 (×19): 15 mL via OROMUCOSAL

## 2021-04-23 MED ORDER — ENOXAPARIN SODIUM 40 MG/0.4ML IJ SOSY
40.0000 mg | PREFILLED_SYRINGE | INTRAMUSCULAR | Status: DC
  Administered 2021-04-23 – 2021-04-29 (×7): 40 mg via SUBCUTANEOUS
  Filled 2021-04-23 (×7): qty 0.4

## 2021-04-23 MED ORDER — ONDANSETRON HCL 4 MG/2ML IJ SOLN
4.0000 mg | Freq: Four times a day (QID) | INTRAMUSCULAR | Status: DC
  Administered 2021-04-24: 4 mg via INTRAVENOUS
  Filled 2021-04-23: qty 2

## 2021-04-23 MED ORDER — METHOCARBAMOL 1000 MG/10ML IJ SOLN
500.0000 mg | Freq: Four times a day (QID) | INTRAVENOUS | Status: DC | PRN
  Administered 2021-04-23 – 2021-05-05 (×6): 500 mg via INTRAVENOUS
  Filled 2021-04-23 (×2): qty 500
  Filled 2021-04-23 (×2): qty 5
  Filled 2021-04-23 (×3): qty 500

## 2021-04-23 MED ORDER — MAGNESIUM SULFATE 2 GM/50ML IV SOLN
2.0000 g | Freq: Once | INTRAVENOUS | Status: AC
  Administered 2021-04-24: 2 g via INTRAVENOUS
  Filled 2021-04-23 (×2): qty 50

## 2021-04-23 NOTE — Progress Notes (Addendum)
PHARMACY - TOTAL PARENTERAL NUTRITION CONSULT NOTE  ? ?Indication:  Partial bowel obstruction ? ?Patient Measurements: ?Height: 5' 6.5" (168.9 cm) ?Weight: 63.8 kg (140 lb 10.5 oz) ?IBW/kg (Calculated) : 64.95 ?TPN AdjBW (KG): 63.8 ?Body mass index is 22.36 kg/m?. ? ?Assessment: 56 years of age male with abdominal wall hernias, GERD, and B12 deficiency status post recent admission for severe protein calorie malnutrition who presents as direct admission from surgical office for chronic partial bowel obstruction with nausea, vomiting, inability to eat and weight loss. Per surgeon note, intake has been some Ensures but other wise very poor oral intake. Likely to need surgery. Pharmacy consulted for TPN.  ? ?Glucose / Insulin: no current data ?Electrolytes: no current data ?Renal:  ?Hepatic:  ?Intake / Output; MIVF: Last BM 3/17. Passing flatus.  ?GI Imaging: ?GI Surgeries / Procedures:  ? ?Central access: pending ?TPN start date: pending central access, likely 3/18 ? ?Nutritional Goals: pending ? ?RD Assessment: pending ?  ? ?Current Nutrition:  ?Regular diet - Poor intake, starting calorie counts ?Recommended for short term TPN pre-seeding possible surgery ? ?Plan:  ?Ordered CMP, Mag and Phos this PM to evaluate lytes and replace if needed per TPN protocol.  ?TPN ordered after institution preparation time - TPN will start on 3/18 once central access with dedicated port for TPN established.  ?TPN labs ordered.  ?If hypoglycemia is an issue, consider D5 or D10 infusion overnight ? ?Link Snuffer, PharmD, BCPS, BCCCP ?Clinical Pharmacist ?Please refer to [facility] for [facility] numbers ?04/23/2021,5:02 PM ? ?Addendum: ?Phos low at 2.5 - will give replacement due to high risk of refeeding with KPhos ?Magnesium low at 1.4 - 2 grams IV ordered per TPN protocol.  ?Potassium low at 2.5 - critical called to MD by RN and 6 runs of KCl 10 mEq ordered. Will receive additonal ~28mEq from [facility].  ?F/up AM labs.  ?

## 2021-04-23 NOTE — Progress Notes (Signed)
Pt received in room 6N05 as a direct admission. Admission history obtained. As much information that pt would allow, obtained. Pt refused to have pants off and skin assessed. Pt noted to be in an agitated mood and making multiple requests for food and something to drink. MD's office made aware of pt's arrival. Receiver on other line to notify Provider of Pt's arrival.Pt made aware of this notification. ?

## 2021-04-23 NOTE — H&P (Signed)
? ?Admitting Physician: Hyman Hopes Ranyah Groeneveld ? ?Service: General surgery ? ?CC: Hernias ? ?Subjective  ? ?HPI: ?Logan Nelson is an 56 y.o. male who is here for hernias.  He has had chronically incarcerated ventral and inguinal hernias which are partially obstructed.  He has had significant weight loss over the last year consistent with malnutrition related to partial obstruction of these hernias.  He was recently admitted due to malnutrition to the medicine service.  Plans were for him to drink ensures and follow-up once his nutrition improved for surgical intervention.  Unfortunately continues to have intolerance of p.o. intake as an outpatient.  I saw him in office earlier this week and recommended admission to the hospital for total parenteral nutrition and treatment of his malnutrition, followed by hernia repair during this admission.  He presents today as a direct admission. ? ?Past Medical History:  ?Diagnosis Date  ? Anemia   ? Arthritis   ? Bradycardia   ? Dyspnea   ? Falling episodes   ? GERD (gastroesophageal reflux disease)   ? Hernia of abdominal wall   ? Hypertension   ? Lower extremity edema   ? ? ?Past Surgical History:  ?Procedure Laterality Date  ? ABDOMINAL SURGERY    ? ? ?No family history on file. ? ?Social:  reports that he quit smoking about a year ago. His smoking use included cigarettes. He has never used smokeless tobacco. He reports that he does not currently use alcohol. He reports that he does not currently use drugs. ? ?Allergies: No Known Allergies ? ?Medications: ?Current Outpatient Medications  ?Medication Instructions  ? Aspirin Low Dose 81 mg, Oral, Daily  ? feeding supplement (ENSURE ENLIVE / ENSURE PLUS) LIQD 237 mLs, Oral, 3 times daily between meals  ? Multiple Vitamins-Minerals (CERTAVITE/ANTIOXIDANTS) TABS 1 tablet, Oral, Daily  ? ondansetron (ZOFRAN) 4 mg, Oral, Every 8 hours PRN  ? Vitamin D (Ergocalciferol) (DRISDOL) 50,000 Units, Oral, Every 7 days  ? ? ?ROS - all  of the below systems have been reviewed with the patient and positives are indicated with bold text ?General: chills, fever or night sweats ?Eyes: blurry vision or double vision ?ENT: epistaxis or sore throat ?Allergy/Immunology: itchy/watery eyes or nasal congestion ?Hematologic/Lymphatic: bleeding problems, blood clots or swollen lymph nodes ?Endocrine: temperature intolerance or unexpected weight changes ?Breast: new or changing breast lumps or nipple discharge ?Resp: cough, shortness of breath, or wheezing ?CV: chest pain or dyspnea on exertion ?GI: as per HPI ?GU: dysuria, trouble voiding, or hematuria ?MSK: joint pain or joint stiffness ?Neuro: TIA or stroke symptoms ?Derm: pruritus and skin lesion changes ?Psych: anxiety and depression ? ?Objective  ? ?PE ?Blood pressure 104/75, pulse 96, temperature 97.7 ?F (36.5 ?C), temperature source Oral, resp. rate 19, height 5' 6.5" (1.689 m), weight 63.8 kg, SpO2 100 %. ?General appearance - Consistent with stated age. Normal posture. Voice Normal. ?Mental status - alert and oriented ?Integumentary - No rash or lesion on limited exam ?Head - Normocephalic, atraumatic ?Face - Strength and tone intact ?Eyes - extraocular movement intact, sclera anicteric ?Chest - quiet, even and easy respiratory effort with no use of accessory muscles ?Neurological - able to articulate well with normal speech/language, rate, volume and coherence. ?Mood/affect - normal ?Judgement and insight -  insight is appropriate concerning matters relevant to self and the patient displays appropriate judgment regarding every day activities. ?Thought Processes/Cognitive Function - aware of current events. ?Musculoskeletal - strength symmetrical throughout, no deformity ?Abdomen -large protuberant incisional  hernia which is easily reducible.  Second fascial defect under the same incision.  Large inguinal scrotal hernia on the right. ? ? ?No results found for this or any previous visit (from the past  24 hour(s)). ? ? ?Imaging Orders    ?     [name] EKG SITE RITE    ?CT abd/pel 03/23/1963 ?IMPRESSION: ?1. Similar appearance of the small bowel, which is diffusely fluid ?filled and distended, largest loops measuring up to 7.0 cm in the ?central abdomen. ?2. There is a redemonstrated large umbilical hernia containing an ?obstructed loop of mid to distal small bowel. There is a sharp ?transition point exiting this hernia and there is scattered air and ?fluid present throughout the remaining distal small bowel and colon. ?Findings are consistent with small bowel obstruction, possibly ?partial and chronic given very similar appearance to prior ?examination. ?3. Large right inguinal hernia containing multiple loops of ?generally decompressed distal small bowel, distal to the suspected ?obstruction point noted above. ?4. Additional epigastric hernia containing decompressed transverse ?colon. ?5. Prostatomegaly. ?6. Hepatic steatosis. ? ? ? ? ?10.7cm tall x 5.8cm wide ventral hernia with two adjacent defects ? ?Assessment and Plan  ? ?Incisional hernia with obstruction but no gangrene ?  ?Right inguinal hernia ?  ?Severe protein-calorie malnutrition (CMS-HCC) ?  ?Logan Nelson has malnutrition related to partial obstruction of his intestines and a incisional and inguinal hernia.  He has been admitted for treatment of his malnutrition followed by hernia repair.  He has failed to be able to stay nourished with enteral intake alone, so I we will start total parenteral nutrition.  He will still be allowed to eat some and we will monitor his p.o. intake.  We will check nutritional parameters and replace these in attempts to get him in the best shape possible prior to surgery.  Unfortunately, due to his nutritional issues, I feel he needs to stay in the hospital till this hernia is repaired.  It will likely take at least 1 week of TPN to get him ready for surgery. ?  ?  ?Logan Nelson Logan Darragh Nay, MD  ? ?[facility] ?Use  [email or web address] to contact on call provider ? ?New Patient Billing: ?[phone] - High MDM ? ?

## 2021-04-24 LAB — COMPREHENSIVE METABOLIC PANEL
ALT: 52 U/L — ABNORMAL HIGH (ref 0–44)
AST: 34 U/L (ref 15–41)
Albumin: 1.6 g/dL — ABNORMAL LOW (ref 3.5–5.0)
Alkaline Phosphatase: 81 U/L (ref 38–126)
Anion gap: 4 — ABNORMAL LOW (ref 5–15)
BUN: 13 mg/dL (ref 6–20)
CO2: 24 mmol/L (ref 22–32)
Calcium: 6.8 mg/dL — ABNORMAL LOW (ref 8.9–10.3)
Chloride: 110 mmol/L (ref 98–111)
Creatinine, Ser: 0.51 mg/dL — ABNORMAL LOW (ref 0.61–1.24)
GFR, Estimated: >60 mL/min (ref >60)
Glucose, Bld: 82 mg/dL (ref 70–99)
Potassium: 2.9 mmol/L — ABNORMAL LOW (ref 3.5–5.1)
Sodium: 138 mmol/L (ref 135–145)
Total Bilirubin: 0.6 mg/dL (ref 0.3–1.2)
Total Protein: 4.1 g/dL — ABNORMAL LOW (ref 6.5–8.1)

## 2021-04-24 LAB — POTASSIUM: Potassium: 3.8 mmol/L (ref 3.5–5.1)

## 2021-04-24 LAB — PHOSPHORUS: Phosphorus: 2.2 mg/dL — ABNORMAL LOW (ref 2.5–4.6)

## 2021-04-24 LAB — IRON AND TIBC: Iron: 14 ug/dL — ABNORMAL LOW (ref 45–182)

## 2021-04-24 LAB — MAGNESIUM: Magnesium: 1.4 mg/dL — ABNORMAL LOW (ref 1.7–2.4)

## 2021-04-24 LAB — GLUCOSE, CAPILLARY
Glucose-Capillary: 85 mg/dL (ref 70–99)
Glucose-Capillary: 116 mg/dL — ABNORMAL HIGH (ref 70–99)

## 2021-04-24 LAB — TRIGLYCERIDES: Triglycerides: 43 mg/dL (ref <150)

## 2021-04-24 MED ORDER — SODIUM CHLORIDE 0.9% FLUSH
10.0000 mL | INTRAVENOUS | Status: DC | PRN
  Administered 2021-04-27: 10 mL

## 2021-04-24 MED ORDER — POTASSIUM CHLORIDE 10 MEQ/100ML IV SOLN: 10.0000 meq | INTRAVENOUS | Status: DC

## 2021-04-24 MED ORDER — TRAVASOL 10 % IV SOLN
INTRAVENOUS | Status: AC
  Filled 2021-04-24: qty 480

## 2021-04-24 MED ORDER — INSULIN ASPART 100 UNIT/ML IJ SOLN
0.0000 [IU] | Freq: Four times a day (QID) | INTRAMUSCULAR | Status: DC
  Administered 2021-04-25: 1 [IU] via SUBCUTANEOUS

## 2021-04-24 MED ORDER — HYDROMORPHONE HCL 1 MG/ML IJ SOLN
0.5000 mg | INTRAMUSCULAR | Status: DC | PRN
  Administered 2021-04-24 – 2021-04-30 (×25): 0.5 mg via INTRAVENOUS
  Filled 2021-04-24 (×25): qty 0.5

## 2021-04-24 MED ORDER — SODIUM CHLORIDE 0.9% FLUSH
10.0000 mL | Freq: Two times a day (BID) | INTRAVENOUS | Status: DC
  Administered 2021-04-24 – 2021-05-05 (×19): 10 mL

## 2021-04-24 MED ORDER — ONDANSETRON HCL 4 MG/2ML IJ SOLN
4.0000 mg | Freq: Four times a day (QID) | INTRAMUSCULAR | Status: DC | PRN
  Administered 2021-04-25 – 2021-05-05 (×14): 4 mg via INTRAVENOUS
  Filled 2021-04-24 (×15): qty 2

## 2021-04-24 MED ORDER — CHLORHEXIDINE GLUCONATE CLOTH 2 % EX PADS
6.0000 | MEDICATED_PAD | Freq: Every day | CUTANEOUS | Status: DC
  Administered 2021-04-24 – 2021-05-05 (×10): 6 via TOPICAL

## 2021-04-24 MED ORDER — SODIUM CHLORIDE 0.9 % IV SOLN
250.0000 mg | Freq: Once | INTRAVENOUS | Status: AC
  Administered 2021-04-24: 250 mg via INTRAVENOUS
  Filled 2021-04-24: qty 20

## 2021-04-24 MED ORDER — FUROSEMIDE 10 MG/ML IJ SOLN
40.0000 mg | Freq: Once | INTRAMUSCULAR | Status: AC
  Administered 2021-04-24: 40 mg via INTRAVENOUS
  Filled 2021-04-24: qty 4

## 2021-04-24 MED ORDER — POTASSIUM CHLORIDE 10 MEQ/50ML IV SOLN
10.0000 meq | INTRAVENOUS | Status: AC
  Administered 2021-04-24 (×6): 10 meq via INTRAVENOUS
  Filled 2021-04-24 (×6): qty 50

## 2021-04-24 NOTE — Progress Notes (Signed)
Peripherally Inserted Central Catheter Placement ? ?The IV Nurse has discussed with the patient and/or persons authorized to consent for the patient, the purpose of this procedure and the potential benefits and risks involved with this procedure.  The benefits include less needle sticks, lab draws from the catheter, and the patient may be discharged home with the catheter. Risks include, but not limited to, infection, bleeding, blood clot (thrombus formation), and puncture of an artery; nerve damage and irregular heartbeat and possibility to perform a PICC exchange if needed/ordered by physician.  Alternatives to this procedure were also discussed.  Bard Power PICC patient education guide, fact sheet on infection prevention and patient information card has been provided to patient /or left at bedside.   ? ?PICC Placement Documentation  ?PICC Double Lumen 04/24/21 Left Brachial 42 cm 0 cm (Active)  ?Indication for Insertion or Continuance of Line Administration of hyperosmolar/irritating solutions (i.e. TPN, Vancomycin, etc.) 04/24/21 0200  ?Exposed Catheter (cm) 0 cm 04/24/21 0200  ?Site Assessment Clean, Dry, Intact 04/24/21 0200  ?Lumen #1 Status Saline locked;Blood return noted 04/24/21 0200  ?Lumen #2 Status Saline locked;Blood return noted 04/24/21 0200  ?Dressing Type Securing device;Transparent 04/24/21 0200  ?Dressing Status Antimicrobial disc in place 04/24/21 0200  ?Safety Lock Not Applicable 04/24/21 0200  ?Line Care Connections checked and tightened 04/24/21 0200  ?Line Adjustment (NICU/IV Team Only) No 04/24/21 0200  ?Dressing Intervention New dressing 04/24/21 0200  ?Dressing Change Due 05/01/21 04/24/21 0200  ? ? ? ? ? ?Burnard Bunting Chenice ?04/24/2021, 2:17 AM ? ?

## 2021-04-24 NOTE — Progress Notes (Signed)
PHARMACY - TOTAL PARENTERAL NUTRITION CONSULT NOTE  ? ?Indication:  partial bowel obstruction ? ?Patient Measurements: ?Height: 5' 6.5" (168.9 cm) ?Weight: 63.8 kg (140 lb 10.5 oz) ?IBW/kg (Calculated) : 64.95 ?TPN AdjBW (KG): 63.8 ?Body mass index is 22.36 kg/m?. ? ?Assessment: 56 years of age male with abdominal wall hernias, GERD, and B12 deficiency status post recent admission for severe protein calorie malnutrition who presents as direct admission from surgical office for chronic partial bowel obstruction with nausea, vomiting, inability to eat and weight loss. Per surgeon note, intake has been some Ensures but other wise very poor oral intake. Likely to need surgery. Pharmacy consulted for TPN.  ? ?Glucose / Insulin: CBG's <110; no insulin coverage prior to TPN ?HgB A1c: 4.0 on 04/12/2021 ?Electrolytes: Na 138; K 2.9; Ch 110; CoCa++ 8.72; Phos 2.2; Mag 1.4 ?Renal: Scr <1 ?Hepatic: AST, Tbili wnl; ALT 52 ?TG: 43 ?Intake / Output; MIVF: Fluid balance +954 mL; no MIVF ?GI Imaging: none ?GI Surgeries / Procedures: none noted ? ?Central access: DL PICC 0/56/9794 ?TPN start date: 04/24/2021 ? ?Nutritional Goals: ?Goal TPN rate is  mL/hr (provides  g of protein and  kcals per day) ? ?RD Assessment: ?  ? ?Current Nutrition:  ?Full liquids ? ?Plan:  ?Start TPN at 28mL/hr at 1800 ?Electrolytes in TPN: Na 49mEq/L, K 61mEq/L, Ca 51mEq/L, Mg 21mEq/L, and Phos 76mmol/L. Cl:Ac 1:1 ?Add standard MVI and trace elements to TPN ?K- 10 mEq X6 runs; Kphos 15 mmol X1;  ?Initiate Sensitive q6h SSI and adjust as needed  ?Monitor TPN labs on Mon/Thurs, PRN ? ?Latondra Gebhart BS, PharmD, BCPS ?Clinical Pharmacist ?04/24/2021 11:08 AM ? ?

## 2021-04-24 NOTE — Progress Notes (Addendum)
Initial Nutrition Assessment ? ?INTERVENTION:  ? ?Monitor magnesium, potassium, and phosphorus BID for at least 3 days, MD to replete as needed, as pt is at risk for refeeding syndrome given severe malnutrition. ? ?-Recommend 100 mg Thiamine daily given high risk of refeeding syndrome ? ?-TPN management per Pharmacy ? ?-48 hour Calorie Count ? ?-Will monitor micronutrient lab results ? ?NUTRITION DIAGNOSIS:  ? ?Increased nutrient needs related to chronic illness as evidenced by estimated needs. ? ?GOAL:  ? ?Patient will meet greater than or equal to 90% of their needs ? ?MONITOR:  ? ?PO intake, Labs, Weight trends, I & O's (TPN) ? ?REASON FOR ASSESSMENT:  ? ?Consult ?New TPN/TNA, Calorie Count ? ?ASSESSMENT:  ? ?56 y.o. male who is here for hernias.  He has had chronically incarcerated ventral and inguinal hernias which are partially obstructed.  He has had significant weight loss over the last year consistent with malnutrition related to partial obstruction of these hernias. ? ?Recently assessed by RD and dietetic intern during previous admission  3/8. Diagnosed with severe malnutrition at that time. ? ?RD consulted for TPN initiation and calorie count to improve nutrition status prior to hernia repair. Per surgery note, plan is for TPN for a at least a week prior to surgery. ?PICC line was placed today. ? ?TPN to begin at 40 ml/hr, providing 873 kcals, 48g protein. ? ?Pt attempted to drink protein supplements at home to improve nutrition status but was unable to tolerate PO intakes given partial obstruction.  ? ?Per nursing documentation, pt with moderate RUE edema and severe BLE edema.  ? ?Nutrition labs pending that were ordered per MD: ?-Copper ?-Vitamin A ?-Vitamin E ?-Vitamin K ?-Zinc ? ?Medications: Lasix, ferric gluconate, IV Mg sulfate, IV KCl, K Phos ? ?Labs reviewed: ?Low K, Mg, Phos ?Low iron (14) ?Low Vitamin D (8.19) ? ?NUTRITION - FOCUSED PHYSICAL EXAM: ? ?Unable to complete, working  remotely. ? ?Diet Order:   ?Diet Order   ? ?       ?  Diet regular Room service appropriate? Yes; Fluid consistency: Thin  Diet effective now       ?  ? ?  ?  ? ?  ? ? ?EDUCATION NEEDS:  ? ?No education needs have been identified at this time ? ?Skin:  Skin Assessment: Reviewed RN Assessment ? ?Last BM:  3/17 ? ?Height:  ? ?Ht Readings from Last 1 Encounters:  ?04/23/21 5' 6.5" (1.689 m)  ? ? ?Weight:  ? ?Wt Readings from Last 1 Encounters:  ?04/23/21 63.8 kg  ? ? ?BMI:  Body mass index is 22.36 kg/m?. ? ?Estimated Nutritional Needs:  ? ?Kcal:  2100-2300 ? ?Protein:  100-115g ? ?Fluid:  2.1L/day ? ?Tilda Franco, MS, RD, LDN ?Inpatient Clinical Dietitian ?Contact information available via Amion ? ?

## 2021-04-24 NOTE — Progress Notes (Signed)
PICC insertion: LUE PICC placed due to edema and decreased ROM of RUE. Jenne Campus, RN instructed to remove LUE PIV and hang new tubing.  ?

## 2021-04-24 NOTE — Progress Notes (Addendum)
? ?  Subjective/Chief Complaint: ?Denies any significant pain.  Still very bloated.  Tolerated diet last night.  No BM, but + flatus ? ? ?Objective: ?Vital signs in last 24 hours: ?Temp:  [97.6 ?F (36.4 ?C)-98.7 ?F (37.1 ?C)] 97.6 ?F (36.4 ?C) (03/18 1700) ?Pulse Rate:  [59-96] 59 (03/18 0816) ?Resp:  [14-19] 16 (03/18 0816) ?BP: (104-131)/(75-95) 107/79 (03/18 0816) ?SpO2:  [99 %-100 %] 99 % (03/18 0816) ?Weight:  [63.8 kg] 63.8 kg (03/17 1212) ?Last BM Date : 04/23/21 ? ?Intake/Output from previous day: ?03/17 0701 - 03/18 0700 ?In: 240 [P.O.:240] ?Out: -  ?Intake/Output this shift: ?Total I/O ?In: 714.2 [IV Piggyback:714.2] ?Out: -  ? ?General appearance: alert, cooperative, and no distress ?Resp: clear to auscultation bilaterally ?Cardio: regular rate and rhythm ?GI: soft, quite distended.  Non tender.  Hernia incarcerated.   ?Extremities: edema + 2 edema.   ? ?Lab Results:  ?Recent Labs  ?  04/23/21 ?1801  ?WBC 4.4  ?HGB 8.8*  ?HCT 26.1*  ?PLT 252  ? ?BMET ?Recent Labs  ?  04/23/21 ?1801 04/24/21 ?0429  ?NA 138 138  ?K 2.5* 2.9*  ?CL 107 110  ?CO2 24 24  ?GLUCOSE 75 82  ?BUN 11 13  ?CREATININE 0.53* 0.51*  ?CALCIUM 7.2* 6.8*  ? ?PT/INR ?Recent Labs  ?  04/23/21 ?1801  ?LABPROT 15.6*  ?INR 1.2  ? ?ABG ?No results for input(s): PHART, HCO3 in the last 72 hours. ? ?Invalid input(s): PCO2, PO2 ? ?Studies/Results: ?[name] EKG SITE RITE ? ?Result Date: 04/23/2021 ?If [employer] not attached, placement could not be confirmed due to current cardiac rhythm.  ? ?Anti-infectives: ?Anti-infectives (From admission, onward)  ? ? None  ? ?  ? ? ?Assessment/Plan: ?s/p * No surgery found * ? ?Chronically incarcerated complex ventral hernia ?Severe protein calorie malnutrition.   ?Anasarca ?Severe hypokalemia ?Iron deficiency ?Anemia of chronic disease and iron deficiency anemia ? ? ?Replete K, recheck this afternoon.   ?Getting TPN today ?Nutritional support. ?Dr. Dossie Der plans surgery in near future.   ? ? ? LOS: 1 day   ? ? ?Almond Lint ?04/24/2021 ? ?

## 2021-04-25 ENCOUNTER — Encounter (HOSPITAL_COMMUNITY): Payer: Self-pay | Admitting: Surgery

## 2021-04-25 LAB — COMPREHENSIVE METABOLIC PANEL
ALT: 62 U/L — ABNORMAL HIGH (ref 0–44)
AST: 49 U/L — ABNORMAL HIGH (ref 15–41)
Albumin: 1.9 g/dL — ABNORMAL LOW (ref 3.5–5.0)
Alkaline Phosphatase: 105 U/L (ref 38–126)
Anion gap: 7 (ref 5–15)
BUN: 17 mg/dL (ref 6–20)
CO2: 25 mmol/L (ref 22–32)
Calcium: 7.3 mg/dL — ABNORMAL LOW (ref 8.9–10.3)
Chloride: 106 mmol/L (ref 98–111)
Creatinine, Ser: 0.54 mg/dL — ABNORMAL LOW (ref 0.61–1.24)
GFR, Estimated: >60 mL/min (ref >60)
Glucose, Bld: 105 mg/dL — ABNORMAL HIGH (ref 70–99)
Potassium: 3.6 mmol/L (ref 3.5–5.1)
Sodium: 138 mmol/L (ref 135–145)
Total Bilirubin: 0.7 mg/dL (ref 0.3–1.2)
Total Protein: 5 g/dL — ABNORMAL LOW (ref 6.5–8.1)

## 2021-04-25 LAB — GLUCOSE, CAPILLARY
Glucose-Capillary: 100 mg/dL — ABNORMAL HIGH (ref 70–99)
Glucose-Capillary: 138 mg/dL — ABNORMAL HIGH (ref 70–99)
Glucose-Capillary: 130 mg/dL — ABNORMAL HIGH (ref 70–99)

## 2021-04-25 LAB — PHOSPHORUS: Phosphorus: 2.9 mg/dL (ref 2.5–4.6)

## 2021-04-25 LAB — COPPER, SERUM: Copper: 74 ug/dL (ref 69–132)

## 2021-04-25 LAB — ZINC: Zinc: 43 ug/dL — ABNORMAL LOW (ref 44–115)

## 2021-04-25 LAB — CALCIUM, IONIZED: Calcium, Ionized, Serum: 4.5 mg/dL (ref 4.5–5.6)

## 2021-04-25 LAB — MAGNESIUM: Magnesium: 1.5 mg/dL — ABNORMAL LOW (ref 1.7–2.4)

## 2021-04-25 LAB — TRIGLYCERIDES: Triglycerides: 53 mg/dL (ref <150)

## 2021-04-25 MED ORDER — POTASSIUM CHLORIDE 10 MEQ/100ML IV SOLN: 10.0000 meq | INTRAVENOUS | Status: DC

## 2021-04-25 MED ORDER — MAGNESIUM SULFATE 2 GM/50ML IV SOLN
2.0000 g | Freq: Once | INTRAVENOUS | Status: AC
  Administered 2021-04-25: 2 g via INTRAVENOUS
  Filled 2021-04-25: qty 50

## 2021-04-25 MED ORDER — TRAVASOL 10 % IV SOLN
INTRAVENOUS | Status: AC
  Filled 2021-04-25: qty 806.4

## 2021-04-25 MED ORDER — POTASSIUM CHLORIDE 10 MEQ/50ML IV SOLN
10.0000 meq | INTRAVENOUS | Status: AC
  Administered 2021-04-25 (×2): 10 meq via INTRAVENOUS
  Filled 2021-04-25 (×2): qty 50

## 2021-04-25 NOTE — Progress Notes (Signed)
? ?  Subjective/Chief Complaint: ?Denies any significant pain.  Still very bloated.  Tolerated diet some yesterday, appetite not great.  No BM, + flatus ? ? ?Objective: ?Vital signs in last 24 hours: ?Temp:  [97.7 ?F (36.5 ?C)-98.4 ?F (36.9 ?C)] 97.7 ?F (36.5 ?C) (03/19 9233) ?Pulse Rate:  [72-94] 94 (03/19 0808) ?Resp:  [16-19] 19 (03/19 0076) ?BP: (99-116)/(66-86) 99/77 (03/19 2263) ?SpO2:  [96 %-100 %] 100 % (03/19 0808) ?Last BM Date : 04/24/21 ? ?Intake/Output from previous day: ?03/18 0701 - 03/19 0700 ?In: 2253 [P.O.:680; I.V.:369.7; IV Piggyback:1203.3] ?Out: 1800 [Urine:1800] ?Intake/Output this shift: ?No intake/output data recorded. ? ?General appearance: alert, cooperative, and no distress ?Resp: clear to auscultation bilaterally ?Cardio: regular rate and rhythm ?GI: soft, quite distended.  Non tender.  Hernia incarcerated.   ?Extremities: edema + 2 edema.   ? ?Lab Results:  ?Recent Labs  ?  04/23/21 ?1801  ?WBC 4.4  ?HGB 8.8*  ?HCT 26.1*  ?PLT 252  ? ?BMET ?Recent Labs  ?  04/24/21 ?0429 04/24/21 ?1812 04/25/21 ?0435  ?NA 138  --  138  ?K 2.9* 3.8 3.6  ?CL 110  --  106  ?CO2 24  --  25  ?GLUCOSE 82  --  105*  ?BUN 13  --  17  ?CREATININE 0.51*  --  0.54*  ?CALCIUM 6.8*  --  7.3*  ? ?PT/INR ?Recent Labs  ?  04/23/21 ?1801  ?LABPROT 15.6*  ?INR 1.2  ? ?ABG ?No results for input(s): PHART, HCO3 in the last 72 hours. ? ?Invalid input(s): PCO2, PO2 ? ?Studies/Results: ?[name] EKG SITE RITE ? ?Result Date: 04/23/2021 ?If [employer] not attached, placement could not be confirmed due to current cardiac rhythm.  ? ?Anti-infectives: ?Anti-infectives (From admission, onward)  ? ? None  ? ?  ? ? ?Assessment/Plan: ?s/p * No surgery found * ? ?Chronically incarcerated complex ventral hernia ?Severe protein calorie malnutrition.   ?Anasarca ?Severe hypokalemia ?Iron deficiency ?Anemia of chronic disease and iron deficiency anemia ? ? ?Potassium moving toward goal, will gently replace today with 20 mEq  IV ?Hypomagnesemia - will also gently replace given TPN with 2 g mag sulfate today ? ?CONTINUE TPN PERIOPERATIVELY given severe protein calorie malnutrition and chronic incarceration, potential plans in place for surgery later this week in nutrition parameters improving as per Dr. Dossie Der ? ? ? LOS: 2 days  ? ?I spent a total of 50 minutes in both face-to-face and non-face-to-face activities, excluding procedures performed, for this visit on the date of this encounter. ? ?Andria Meuse ?04/25/2021 ? ?

## 2021-04-25 NOTE — Progress Notes (Signed)
PHARMACY - TOTAL PARENTERAL NUTRITION CONSULT NOTE  ? ?Indication:  partial bowel obstruction ? ?Patient Measurements: ?Height: 5' 6.5" (168.9 cm) ?Weight: 63.8 kg (140 lb 10.5 oz) ?IBW/kg (Calculated) : 64.95 ?TPN AdjBW (KG): 63.8 ?Body mass index is 22.36 kg/m?. ? ?Assessment: 56 years of age male with abdominal wall hernias, GERD, and B12 deficiency status post recent admission for severe protein calorie malnutrition who presents as direct admission from surgical office for chronic partial bowel obstruction with nausea, vomiting, inability to eat and weight loss. Per surgeon note, intake has been some Ensures but other wise very poor oral intake. Likely to need surgery. Pharmacy consulted for TPN.  ? ?Glucose / Insulin: CBG's <120; no insulin coverage prior to TPN ?HgB A1c: 4.0 on 04/12/2021 ?Electrolytes: Na 138; K 3.6; Ch 106; CoCa++ 8.98; Phos 2.9; Mag 1.5 ?Renal: Scr <1 ?Hepatic: AST, Tbili wnl; ALT 52 ?TG: 43 ?Intake / Output; MIVF: Fluid balance +963 mL; no MIVF; Last BM 3/18 ?GI Imaging: none ?GI Surgeries / Procedures: none noted ? ?Central access: DL PICC 1/77/9390 ?TPN start date: 04/24/2021 ? ?Nutritional Goals: ?Goal TPN rate is  85 mL/hr (provides 114 g of protein and 2110 kcals per day) ? ?RD Assessment: ?Estimated Needs ?Total Energy Estimated Needs: 2100-2300 ?Total Protein Estimated Needs: 100-115g ?Total Fluid Estimated Needs: 2.1L/day ? ?Current Nutrition:  ?Full liquids ? ?Plan:  ?Increase TPN to 60 mL/hr at 1800 ?Electrolytes in TPN: Na 50 mEq/L, K 50 mEq/L, Ca 5 mEq/L, Mg 10 mEq/L, and Phos 15 mmol/L. Cl:Ac 1:1 ?Add standard MVI and trace elements to TPN ?K- 10 mEq X2 runs; Mag 2 grams iv X1 ?Initiate Sensitive q6h SSI and adjust as needed  ?Monitor TPN labs on Mon/Thurs, PRN ? ?Kanyon Bunn BS, PharmD, BCPS ?Clinical Pharmacist ?04/25/2021 9:09 AM ? ?

## 2021-04-25 NOTE — Progress Notes (Addendum)
Chaplain responded to [facility] for AD.  Pt states he is in a lot of pain today, and he "can't do that now". States that he doesn't remember asking for one. Pt indicated that he already has the AD information, pointing to a shelf across from his bed. Chaplain advised the pt that he can let his RN know if he would like to complete the paperwork at any time during business hours. which is when we can can facility the process of notarization.   ? ?Please contact per pt request or as needed for f/u. ? ?Minus Liberty, Chaplain ?Pager:  [phone] ? ? ? 04/25/21 1355  ?Clinical Encounter Type  ?Visited With Patient  ?Visit Type Initial ?(AD)  ?Referral From Nurse  ?Consult/Referral To Chaplain  ?Spiritual Encounters  ?Spiritual Needs Other (Comment) ?(AD)  ?Stress Factors  ?Patient Stress Factors Health changes  ? ? ?

## 2021-04-26 LAB — PHOSPHORUS: Phosphorus: 3.3 mg/dL (ref 2.5–4.6)

## 2021-04-26 LAB — COMPREHENSIVE METABOLIC PANEL
ALT: 61 U/L — ABNORMAL HIGH (ref 0–44)
AST: 40 U/L (ref 15–41)
Albumin: 2 g/dL — ABNORMAL LOW (ref 3.5–5.0)
Alkaline Phosphatase: 107 U/L (ref 38–126)
Anion gap: 6 (ref 5–15)
BUN: 33 mg/dL — ABNORMAL HIGH (ref 6–20)
CO2: 25 mmol/L (ref 22–32)
Calcium: 7.7 mg/dL — ABNORMAL LOW (ref 8.9–10.3)
Chloride: 105 mmol/L (ref 98–111)
Creatinine, Ser: 0.49 mg/dL — ABNORMAL LOW (ref 0.61–1.24)
GFR, Estimated: >60 mL/min (ref >60)
Glucose, Bld: 105 mg/dL — ABNORMAL HIGH (ref 70–99)
Potassium: 3.7 mmol/L (ref 3.5–5.1)
Sodium: 136 mmol/L (ref 135–145)
Total Bilirubin: 0.8 mg/dL (ref 0.3–1.2)
Total Protein: 5.7 g/dL — ABNORMAL LOW (ref 6.5–8.1)

## 2021-04-26 LAB — GLUCOSE, CAPILLARY
Glucose-Capillary: 118 mg/dL — ABNORMAL HIGH (ref 70–99)
Glucose-Capillary: 107 mg/dL — ABNORMAL HIGH (ref 70–99)
Glucose-Capillary: 119 mg/dL — ABNORMAL HIGH (ref 70–99)
Glucose-Capillary: 111 mg/dL — ABNORMAL HIGH (ref 70–99)

## 2021-04-26 LAB — C-REACTIVE PROTEIN: CRP: 10.9 mg/dL — ABNORMAL HIGH (ref <1.0)

## 2021-04-26 LAB — MAGNESIUM: Magnesium: 2.2 mg/dL (ref 1.7–2.4)

## 2021-04-26 LAB — PREALBUMIN: Prealbumin: 6.2 mg/dL — ABNORMAL LOW (ref 18–38)

## 2021-04-26 LAB — TRIGLYCERIDES: Triglycerides: 74 mg/dL (ref <150)

## 2021-04-26 MED ORDER — BOOST / RESOURCE BREEZE PO LIQD CUSTOM
1.0000 | Freq: Three times a day (TID) | ORAL | Status: DC
  Administered 2021-04-26 – 2021-05-02 (×7): 1 via ORAL

## 2021-04-26 MED ORDER — TRAVASOL 10 % IV SOLN
INTRAVENOUS | Status: AC
  Filled 2021-04-26: qty 1142.4

## 2021-04-26 MED ORDER — INSULIN ASPART 100 UNIT/ML IJ SOLN: 0.0000 [IU] | Freq: Two times a day (BID) | INTRAMUSCULAR | Status: DC

## 2021-04-26 MED ORDER — ADULT MULTIVITAMIN W/MINERALS CH
1.0000 | ORAL_TABLET | Freq: Every day | ORAL | Status: DC
  Administered 2021-04-27 – 2021-04-29 (×3): 1 via ORAL
  Filled 2021-04-26 (×3): qty 1

## 2021-04-26 NOTE — Progress Notes (Signed)
Mobility Specialist Progress Note: ? ? 04/26/21 1546  ?Mobility  ?Activity Ambulated with assistance in hallway  ?Level of Assistance Standby assist, set-up cues, supervision of patient - no hands on  ?Assistive Device Front wheel walker  ?Distance Ambulated (ft) 300 ft  ?Activity Response Tolerated well  ?$Mobility charge 1 Mobility  ? ?Pt received in bed willing to participate in mobility. Complaints of back pain. Left in chair with call bell in reach and all needs met.  ? ?Charlette Hennings ?Mobility Specialist ?Primary Phone [phone] ? ?

## 2021-04-26 NOTE — Progress Notes (Signed)
? ?  Subjective/Chief Complaint: ?No complaints this AM.  Some nausea yesterday ? ? ?Objective: ?Vital signs in last 24 hours: ?Temp:  [97.9 ?F (36.6 ?C)-98.2 ?F (36.8 ?C)] 98.2 ?F (36.8 ?C) (03/20 0726) ?Pulse Rate:  [89-108] 107 (03/20 0726) ?Resp:  [16-18] 16 (03/20 0726) ?BP: (106-114)/(84-96) 114/96 (03/20 0726) ?SpO2:  [96 %-100 %] 96 % (03/20 0726) ?Last BM Date : 04/24/21 ? ?Intake/Output from previous day: ?03/19 0701 - 03/20 0700 ?In: 30 [P.O.:510] ?Out: 600 [Urine:600] ?Intake/Output this shift: ?Total I/O ?In: 57 [P.O.:237] ?Out: -  ? ?General appearance: alert, cooperative, and no distress ?Resp: clear to auscultation bilaterally ?Cardio: regular rate and rhythm ?GI: soft, quite distended.  Non tender.  Hernia incarcerated.   ?Extremities: edema + 2 edema.   ? ?Lab Results:  ?Recent Labs  ?  04/23/21 ?1801  ?WBC 4.4  ?HGB 8.8*  ?HCT 26.1*  ?PLT 252  ? ? ?BMET ?Recent Labs  ?  04/25/21 ?0435 04/26/21 ?0405  ?NA 138 136  ?K 3.6 3.7  ?CL 106 105  ?CO2 25 25  ?GLUCOSE 105* 105*  ?BUN 17 33*  ?CREATININE 0.54* 0.49*  ?CALCIUM 7.3* 7.7*  ? ? ?PT/INR ?Recent Labs  ?  04/23/21 ?1801  ?LABPROT 15.6*  ?INR 1.2  ? ? ?ABG ?No results for input(s): PHART, HCO3 in the last 72 hours. ? ?Invalid input(s): PCO2, PO2 ? ?Studies/Results: ?No results found. ? ?Anti-infectives: ?Anti-infectives (From admission, onward)  ? ? None  ? ?  ? ? ?Assessment/Plan: ? ?Chronically incarcerated complex ventral hernia ?Severe protein calorie malnutrition.   ?Anasarca ?Severe hypokalemia ?Iron deficiency ?Anemia of chronic disease and iron deficiency anemia ? ? ?Electrolyte replacement with TPN ? ?CONTINUE TPN PERIOPERATIVELY given severe protein calorie malnutrition and chronic incarceration, not tolerating PO intake and likely not digesting PO intake ? ?Possible surgery this Friday if he improves this week ? ? ? LOS: 3 days  ? ?I spent a total of 50 minutes in both face-to-face and non-face-to-face activities, excluding procedures  performed, for this visit on the date of this encounter. ? ?Logan Nelson ?04/26/2021 ? ?

## 2021-04-26 NOTE — TOC Progression Note (Addendum)
Transition of Care (TOC) - Progression Note  ? ? ?Patient Details  ?Name: Logan Nelson ?MRN: [id number] ?Date of Birth: Jun 29, 1965 ? ?Transition of Care (TOC) CM/SW Contact  ?Kingsley Plan, RN ?Phone Number: ?04/26/2021, 8:26 AM ? ?Clinical Narrative:    ? ? ?Transition of Care (TOC) Screening Note ? ? ?Patient Details  ?Name: Logan Nelson ?Date of Birth: Feb 21, 1965 ? ? ?Patient has PCP , changed pharmacy to [facility]  ? ?Patient has appointment at [facility] and wellness 05/12/21 . Information on AVS  ?Transition of Care (TOC) Screening Note ? ? ?Patient Details  ?Name: Logan Nelson ?Date of Birth: [birth year] ? ? ? ? ? ?Transition of Care Department [facility]) has reviewed patient and no TOC needs have been identified at this time. We will continue to monitor patient advancement through interdisciplinary progression rounds. If new patient transition needs arise, please place a TOC consult. ?  ? ? ?Transition of Care Department [facility]) has reviewed patient and no TOC needs have been identified at this time. We will continue to monitor patient advancement through interdisciplinary progression rounds. If new patient transition needs arise, please place a TOC consult. ?  ? ?  ?  ? ?Expected Discharge Plan and Services ?  ?  ?  ?  ?  ?                ?  ?  ?  ?  ?  ?  ?  ?  ?  ?  ? ? ?Social Determinants of Health (SDOH) Interventions ?  ? ?Readmission Risk Interventions ?No flowsheet data found. ? ?

## 2021-04-26 NOTE — Progress Notes (Signed)
Mobility Specialist Progress Note: ? ? 04/26/21 1106  ?Mobility  ?Activity Dangled on edge of bed;Stood at bedside;Turned to back - supine  ?Level of Assistance Moderate assist, patient does 50-74%  ?Assistive Device Front wheel walker  ?Activity Response Tolerated well  ?$Mobility charge 1 Mobility  ? ?Pt wanting to lay back in bed. Required modA to get legs back into bed. Left in bed with call bell in reach and all needs met. ? ?Jahmil Macleod ?Mobility Specialist ?Primary Phone [phone] ? ?

## 2021-04-26 NOTE — Progress Notes (Addendum)
Nutrition Follow-up ? ?DOCUMENTATION CODES:  ? ?Not applicable ? ?INTERVENTION:  ? ?-D/c calorie count ?-TPN management per pharmacy; sent secure chat to regarding low vitamin D level and possibility of transitioning MVI to PO; will add PO MVI tomorrow ?-Boost Breeze po TID, each supplement provides 250 kcal and 9 grams of protein  ? ?NUTRITION DIAGNOSIS:  ? ?Increased nutrient needs related to chronic illness as evidenced by estimated needs. ? ?Ongoing ? ?GOAL:  ? ?Patient will meet greater than or equal to 90% of their needs ? ?Progressing  ? ?MONITOR:  ? ?PO intake, Labs, Weight trends, I & O's (TPN) ? ?REASON FOR ASSESSMENT:  ? ?Consult ?New TPN/TNA, Calorie Count ? ?ASSESSMENT:  ? ?56 y.o. male who is here for hernias.  He has had chronically incarcerated ventral and inguinal hernias which are partially obstructed.  He has had significant weight loss over the last year consistent with malnutrition related to partial obstruction of these hernias. ? ?3/18- PICC placed, TPN initiated ? ?Reviewed I/O's: -90 ml x 24 hours and +723 ml since admission ? ?UOP: 600 ml x 24 hours ? ?Spoke with pt over the phone, who reports poor oral intake due to early satiety. Per pt, he has only been able to eat "small amounts" but was eating whatever he desired PTA. Pt states he will likely eat a bowl of cereal later on; RD offered to order breakfast meal for him, however, pt declined, stating he was "still waking up". Pt shares that he does not like the flavor of Ensure supplements. Reviewed other supplements on the formulary and he is willing to try  Boost Breeze.  ? ?Per MD notes, with poor nutritional status and plan to continue TPN pre-operatively. Per pharmacy note, pt currently receiving TPN at 60 ml/hr, which provides 1489 kcals and 81 grams protein (71% of estimated kcal needs and 81% of estimated protein needs). Plan to advance to goal rate of 85 ml/hr at 1800 (provides 2149 kcals ans 114 grams protein, meeting 100% of  estimated kcal and protein needs). Reached out to pharmacy on secure chat regarding low vitamin D level and possibility of transitioning MVI to PO.  ? ?3/18 ?Breakfast: 1082 kcals, 35 grams protein ?Lunch: 386 kcals, 9 grams protein ?Dinner: nothing documented ? ?Total intake: ?1486 kcal (71% of minimum estimated needs)  ?44 grams protein (44% of minimum estimated needs) ? ?3/19 ?Breakfast: nothing documented ?Lunch: 458 kcals, 23 grams protein ?Dinner: 311 kcals, 18 grams protein ? ?Total intake: ?769 kcal (37% of minimum estimated needs)  ?41 grams protein (41% of minimum estimated needs) ? ?Total intake: ?1128 kcal (54% of minimum estimated needs)  ?44 grams protein (44% of minimum estimated needs) ? ?Labs reviewed: CBGS: 100-138 (inpatient orders for glycemic control are 0-9 units insulin aspart every 12 hours). K, Mg, and Phos WDL. Copper, thiamine, and zinc WDL. Vitamin D: 8.19. Vitamin B-12: 929. Vitamin A,E, K levels still pending.  ? ?Diet Order:   ?Diet Order   ? ?       ?  Diet regular Room service appropriate? Yes; Fluid consistency: Thin  Diet effective now       ?  ? ?  ?  ? ?  ? ? ?EDUCATION NEEDS:  ? ?No education needs have been identified at this time ? ?Skin:  Skin Assessment: Reviewed RN Assessment ? ?Last BM:  04/25/21 ? ?Height:  ? ?Ht Readings from Last 1 Encounters:  ?04/23/21 5' 6.5" (1.689 m)  ? ? ?Weight:  ? ?  Wt Readings from Last 1 Encounters:  ?04/23/21 63.8 kg  ? ?BMI:  Body mass index is 22.36 kg/m?. ? ?Estimated Nutritional Needs:  ? ?Kcal:  2100-2300 ? ?Protein:  100-115g ? ?Fluid:  2.1L/day ? ? ? ?Levada Schilling, RD, LDN, CDCES ?Registered Dietitian II ?Certified Diabetes Care and Education Specialist ?Please refer to [facility] for RD and/or RD on-call/weekend/after hours pager  ?

## 2021-04-26 NOTE — Progress Notes (Signed)
PHARMACY - TOTAL PARENTERAL NUTRITION CONSULT NOTE  ? ?Indication:  partial bowel obstruction ? ?Patient Measurements: ?Height: 5' 6.5" (168.9 cm) ?Weight: 63.8 kg (140 lb 10.5 oz) ?IBW/kg (Calculated) : 64.95 ?TPN AdjBW (KG): 63.8 ?Body mass index is 22.36 kg/m?. ? ?Assessment: 56 years of age male with abdominal wall hernias, GERD, and B12 deficiency status post recent admission for severe protein calorie malnutrition who presents as direct admission from surgical office for chronic partial bowel obstruction with nausea, vomiting, inability to eat and weight loss. Per surgeon note, intake has been some Ensures but other wise very poor oral intake. Patient with 40% weight loss in 6 months. Patient admitted for nutritional optimization before hernia repair surgery. Pharmacy consulted for TPN.  ? ?Glucose / Insulin:  BG <120; on SSI- 1 unit used, HgB A1c: 4.0 on 04/12/2021 ?Electrolytes:  K 3.7 (received 20 mEq IV; CoCa 9.3; Mag 2.2 (received 2g IV), others wnl ?Renal: Scr 0.5, BUN 33 up  ?Hepatic: AST/Tbili wnl; ALT 61, TG 74, Albumin 2.0, prealbumin 6.2  ?Intake / Output; MIVF: UOP charted, Last BM 3/19 ?GI Imaging: none ?GI Surgeries / Procedures: none   ? ?Central access: DL PICC 6/60/6301 ?TPN start date: 04/24/2021 ? ?Nutritional Goals: ?Goal TPN rate is 85 mL/hr (provides 114 g of protein and 2149 kcals per day) ? ?RD Assessment: ?Estimated Needs ?Total Energy Estimated Needs: 2100-2300 ?Total Protein Estimated Needs: 100-115g ?Total Fluid Estimated Needs: 2.1L/day ? ?Current Nutrition:  ?Full liquids ? ?Plan:  ?Advance TPN to goal 85 mL/hr at 1800 ?Electrolytes in TPN: Increase Na 100 mEq/L, K 40 mEq/L; Adjust to keep same with rate change: Ca 4 mEq/L, Mg 7 mEq/L, and Phos 11 mmol/L. Cl:Ac 1:1 ?Add standard MVI and trace elements to TPN  ?Decrease Sensitive  SSI to Q 12 hr and stop if not needing ?Monitor TPN labs daily until stable at goal then on Mon/Thurs ?F/u surgery plan  ? ?Alphia Moh, PharmD, BCPS,  BCCP ?Clinical Pharmacist ? ?Please check AMION for all [facility] Pharmacy phone numbers ?After 10:00 PM, call Main Pharmacy [phone] ? ?

## 2021-04-27 LAB — BASIC METABOLIC PANEL
Anion gap: 7 (ref 5–15)
BUN: 25 mg/dL — ABNORMAL HIGH (ref 6–20)
CO2: 28 mmol/L (ref 22–32)
Calcium: 7.8 mg/dL — ABNORMAL LOW (ref 8.9–10.3)
Chloride: 106 mmol/L (ref 98–111)
Creatinine, Ser: 0.49 mg/dL — ABNORMAL LOW (ref 0.61–1.24)
GFR, Estimated: >60 mL/min (ref >60)
Glucose, Bld: 116 mg/dL — ABNORMAL HIGH (ref 70–99)
Potassium: 3.3 mmol/L — ABNORMAL LOW (ref 3.5–5.1)
Sodium: 141 mmol/L (ref 135–145)

## 2021-04-27 LAB — PHOSPHORUS: Phosphorus: 2.4 mg/dL — ABNORMAL LOW (ref 2.5–4.6)

## 2021-04-27 LAB — MAGNESIUM: Magnesium: 2 mg/dL (ref 1.7–2.4)

## 2021-04-27 MED ORDER — POTASSIUM CHLORIDE 10 MEQ/100ML IV SOLN
10.0000 meq | INTRAVENOUS | Status: AC
  Administered 2021-04-27: 10 meq via INTRAVENOUS
  Filled 2021-04-27: qty 100

## 2021-04-27 MED ORDER — POTASSIUM PHOSPHATES 15 MMOLE/5ML IV SOLN
20.0000 mmol | Freq: Once | INTRAVENOUS | Status: AC
  Administered 2021-04-27: 20 mmol via INTRAVENOUS
  Filled 2021-04-27: qty 6.67

## 2021-04-27 MED ORDER — PROCHLORPERAZINE EDISYLATE 10 MG/2ML IJ SOLN
10.0000 mg | Freq: Four times a day (QID) | INTRAMUSCULAR | Status: DC | PRN
Start: 2021-04-27 — End: 2021-05-05
  Administered 2021-04-27 – 2021-05-05 (×5): 10 mg via INTRAVENOUS
  Filled 2021-04-27 (×5): qty 2

## 2021-04-27 MED ORDER — POTASSIUM CHLORIDE 10 MEQ/100ML IV SOLN: 10.0000 meq | INTRAVENOUS | Status: DC

## 2021-04-27 MED ORDER — TRAVASOL 10 % IV SOLN
INTRAVENOUS | Status: AC
  Filled 2021-04-27: qty 1142.4

## 2021-04-27 MED ORDER — OXYCODONE HCL 5 MG PO TABS
5.0000 mg | ORAL_TABLET | ORAL | Status: DC | PRN
  Administered 2021-04-27 – 2021-04-29 (×8): 5 mg via ORAL
  Filled 2021-04-27 (×8): qty 1

## 2021-04-27 MED ORDER — SODIUM CHLORIDE 0.9 % IV SOLN
250.0000 mg | Freq: Every day | INTRAVENOUS | Status: AC
  Administered 2021-04-27 – 2021-04-29 (×3): 250 mg via INTRAVENOUS
  Filled 2021-04-27 (×4): qty 20

## 2021-04-27 MED ORDER — POTASSIUM CHLORIDE 10 MEQ/100ML IV SOLN
10.0000 meq | Freq: Once | INTRAVENOUS | Status: AC
Start: 2021-04-27 — End: 2021-04-27
  Administered 2021-04-27: 10 meq via INTRAVENOUS

## 2021-04-27 MED ORDER — ACETAMINOPHEN 325 MG PO TABS
650.0000 mg | ORAL_TABLET | Freq: Four times a day (QID) | ORAL | Status: DC | PRN
  Administered 2021-05-04: 650 mg via ORAL
  Filled 2021-04-27: qty 2

## 2021-04-27 MED ORDER — POTASSIUM CHLORIDE 10 MEQ/100ML IV SOLN
INTRAVENOUS | Status: AC
  Administered 2021-04-27: 10 meq
  Filled 2021-04-27: qty 100

## 2021-04-27 NOTE — Progress Notes (Signed)
Patient ID: Logan Nelson, male   DOB: 04/07/1965, 56 y.o.   MRN: [id number] ?[facility] ?Progress Note ? ?   ?Subjective: ?CC-  ?Having more abdominal pain and nausea today compared to yesterday. No emesis. Tolerated 50-75% of his meals yesterday and had two Bms last night. Passing some flatus this morning. ? ?Objective: ?Vital signs in last 24 hours: ?Temp:  [97.8 ?F (36.6 ?C)-98.4 ?F (36.9 ?C)] 98.4 ?F (36.9 ?C) (03/21 0746) ?Pulse Rate:  [89-96] 96 (03/21 0746) ?Resp:  [16-17] 17 (03/21 0746) ?BP: (110-127)/(83-90) 114/90 (03/21 0746) ?SpO2:  [98 %-100 %] 98 % (03/21 0746) ?Last BM Date : 04/26/21 ? ?Intake/Output from previous day: ?03/20 0701 - 03/21 0700 ?In: 237 [P.O.:237] ?Out: 200 [Urine:200] ?Intake/Output this shift: ?Total I/O ?In: -  ?Out: 250 [Urine:250] ? ?PE: ?Gen:  Alert, NAD ?Card:  RRR ?Pulm:  CTAB, no W/R/R, rate and effort normal ?Abd: distended but soft, nontender, hernias partially reducible  ?Ext:  2+ edema BLE, calves soft and nontender ?Skin: no rashes noted, warm and dry ? ?Lab Results:  ?No results for input(s): WBC, HGB, HCT, PLT in the last 72 hours. ?BMET ?Recent Labs  ?  04/26/21 ?0405 04/27/21 ?0255  ?NA 136 141  ?K 3.7 3.3*  ?CL 105 106  ?CO2 25 28  ?GLUCOSE 105* 116*  ?BUN 33* 25*  ?CREATININE 0.49* 0.49*  ?CALCIUM 7.7* 7.8*  ? ?PT/INR ?No results for input(s): LABPROT, INR in the last 72 hours. ?CMP  ?   ?Component Value Date/Time  ? NA 141 04/27/2021 0255  ? NA 143 12/08/2020 1103  ? K 3.3 (L) 04/27/2021 0255  ? CL 106 04/27/2021 0255  ? CO2 28 04/27/2021 0255  ? GLUCOSE 116 (H) 04/27/2021 0255  ? BUN 25 (H) 04/27/2021 0255  ? BUN 14 12/08/2020 1103  ? CREATININE 0.49 (L) 04/27/2021 0255  ? CALCIUM 7.8 (L) 04/27/2021 0255  ? PROT 5.7 (L) 04/26/2021 0405  ? PROT 6.7 12/08/2020 1103  ? ALBUMIN 2.0 (L) 04/26/2021 0405  ? ALBUMIN 3.8 12/08/2020 1103  ? AST 40 04/26/2021 0405  ? ALT 61 (H) 04/26/2021 0405  ? ALKPHOS 107 04/26/2021 0405  ? BILITOT 0.8 04/26/2021 0405   ? BILITOT 0.2 12/08/2020 1103  ? GFRNONAA >60 04/27/2021 0255  ? GFRAA >60 01/23/2019 1523  ? ?Lipase  ?   ?Component Value Date/Time  ? LIPASE 28 12/17/2020 0924  ? ? ? ? ? ?Studies/Results: ?No results found. ? ?Anti-infectives: ?Anti-infectives (From admission, onward)  ? ? None  ? ?  ? ? ? ?Assessment/Plan ?Chronically incarcerated complex ventral hernia ?- Planning for surgery this Thursday or Friday after several days of nutrition. Add compazine for refractory nausea. ? ?Severe protein calorie malnutrition - prealbumin 6.2 (3/20) from 5.5. continue TPN ?Anasarca ?Hypokalemia/ hypophosphatemia - replacement ordered by pharmacy ?Anemia of chronic disease and iron deficiency anemia - Hgb 8.8 (3/17). Will add IV iron replacement today ? ?ID - none ?FEN - TPN, regular diet, Boost ?VTE - lovenox ?Foley - none ? ? ?I reviewed last 24 h vitals and pain scores, last 48 h intake and output, and last 24 h labs and trends ? ? LOS: 4 days  ? ? ?Franne Forts, PA-C ?[facility] ?04/27/2021, 9:48 AM ?Please see Amion for pager number during day hours 7:00am-4:30pm ? ?

## 2021-04-27 NOTE — Progress Notes (Signed)
PHARMACY - TOTAL PARENTERAL NUTRITION CONSULT NOTE  ? ?Indication:  partial bowel obstruction ? ?Patient Measurements: ?Height: 5' 6.5" (168.9 cm) ?Weight: 63.8 kg (140 lb 10.5 oz) ?IBW/kg (Calculated) : 64.95 ?TPN AdjBW (KG): 63.8 ?Body mass index is 22.36 kg/m?. ? ?Assessment: 56 years of age male with abdominal wall hernias, GERD, and B12 deficiency status post recent admission for severe protein calorie malnutrition who presents as direct admission from surgical office for chronic partial bowel obstruction with nausea, vomiting, inability to eat and weight loss. Per surgeon note, intake has been some Ensures but other wise very poor oral intake. Patient with 40% weight loss in 6 months. Patient admitted for nutritional optimization before hernia repair surgery, possibly 3/24. Pharmacy consulted for TPN.  ? ?Glucose / Insulin:  BG <120; on SSI- 0 units used, HgB A1c: 4.0 on 04/12/2021 ?Electrolytes:  K 3.3 down, CoCa 9.4; phos 2.4 down, Mg 2 down, others wnl ?Renal: Scr 0.5, BUN 33 up  ?Hepatic: AST/Tbili wnl; ALT 61, TG 74, Albumin 2.0, prealbumin 6.2  ?Intake / Output; MIVF: UOP incompletely charted, Last BM 3/19 ?GI Imaging: none ?GI Surgeries / Procedures: none   ? ?Central access: DL PICC 10/19/2581 ?TPN start date: 04/24/2021 ? ?Nutritional Goals: ?Goal TPN rate is 85 mL/hr (provides 114 g of protein and 2149 kcals per day) ? ?RD Assessment: ?Estimated Needs ?Total Energy Estimated Needs: 2100-2300 ?Total Protein Estimated Needs: 100-115g ?Total Fluid Estimated Needs: 2.1L/day ? ?Current Nutrition:  ?Full liquids ? ?Plan:  ?Continue TPN at goal 85 mL/hr at 1800 ?Electrolytes in TPN: Increase K 55 mEq/L, Mg 10 mEq/L, Phos 18 mmol/L; Continue Na 100 mEq/L, Ca 4 mEq/L. Cl:Ac 1:1 ?Per RD, remove standard MVI and trace elements in TPN and give PO for more vitamin D repletion ?Give Kphos 20 mmol IV x1 ?Give Kcl 20 mEq IV  ?Stop insulin ?Monitor TPN labs daily until stable at goal then on Mon/Thurs ?F/u surgery plan   ? ?Alphia Moh, PharmD, BCPS, BCCP ?Clinical Pharmacist ? ?Please check AMION for all [facility] Pharmacy phone numbers ?After 10:00 PM, call Main Pharmacy [phone] ? ?

## 2021-04-28 ENCOUNTER — Inpatient Hospital Stay (HOSPITAL_COMMUNITY): Payer: Medicaid Other

## 2021-04-28 ENCOUNTER — Encounter (HOSPITAL_COMMUNITY): Payer: Self-pay | Admitting: Surgery

## 2021-04-28 LAB — CBC
HCT: 24.7 % — ABNORMAL LOW (ref 39.0–52.0)
Hemoglobin: 8 g/dL — ABNORMAL LOW (ref 13.0–17.0)
MCH: 30.8 pg (ref 26.0–34.0)
MCHC: 32.4 g/dL (ref 30.0–36.0)
MCV: 95 fL (ref 80.0–100.0)
Platelets: 217 10*3/uL (ref 150–400)
RBC: 2.6 MIL/uL — ABNORMAL LOW (ref 4.22–5.81)
RDW: 15.9 % — ABNORMAL HIGH (ref 11.5–15.5)
WBC: 6.5 10*3/uL (ref 4.0–10.5)
nRBC: 0 % (ref 0.0–0.2)

## 2021-04-28 LAB — BASIC METABOLIC PANEL
Anion gap: 4 — ABNORMAL LOW (ref 5–15)
BUN: 24 mg/dL — ABNORMAL HIGH (ref 6–20)
CO2: 29 mmol/L (ref 22–32)
Calcium: 7.9 mg/dL — ABNORMAL LOW (ref 8.9–10.3)
Chloride: 106 mmol/L (ref 98–111)
Creatinine, Ser: 0.34 mg/dL — ABNORMAL LOW (ref 0.61–1.24)
GFR, Estimated: >60 mL/min (ref >60)
Glucose, Bld: 107 mg/dL — ABNORMAL HIGH (ref 70–99)
Potassium: 4 mmol/L (ref 3.5–5.1)
Sodium: 139 mmol/L (ref 135–145)

## 2021-04-28 LAB — PHOSPHORUS: Phosphorus: 2.8 mg/dL (ref 2.5–4.6)

## 2021-04-28 LAB — VITAMIN K1, SERUM: VITAMIN K1: 0.18 ng/mL (ref 0.10–2.20)

## 2021-04-28 LAB — MAGNESIUM: Magnesium: 2 mg/dL (ref 1.7–2.4)

## 2021-04-28 IMAGING — DX DG ABD PORTABLE 1V
1 series · 2 of 2 positions shown · non-contrast
Comparison: [DATE]

CLINICAL DATA: Abdominal distension

EXAM:
PORTABLE ABDOMEN - 1 VIEW

[Series 1: abdomen · 0.14mm/px · 2 of 2 slices shown]
[im 1/2]
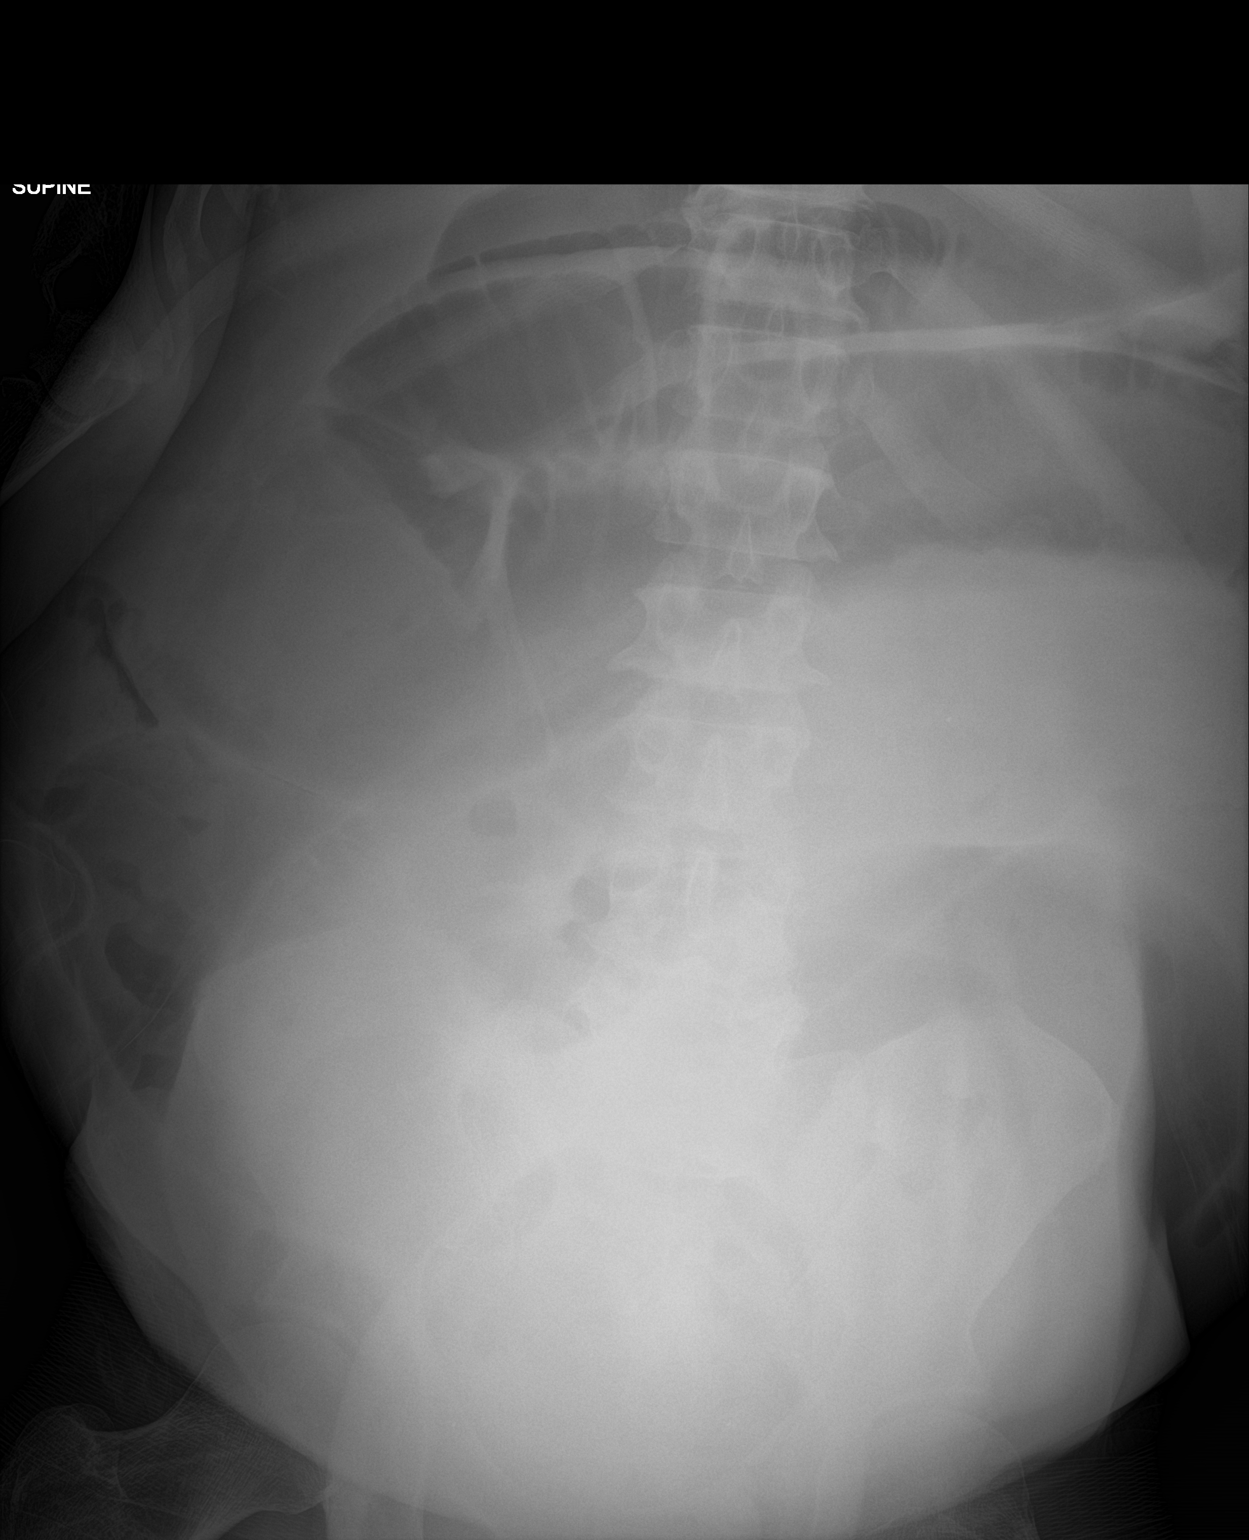
[im 2/2]
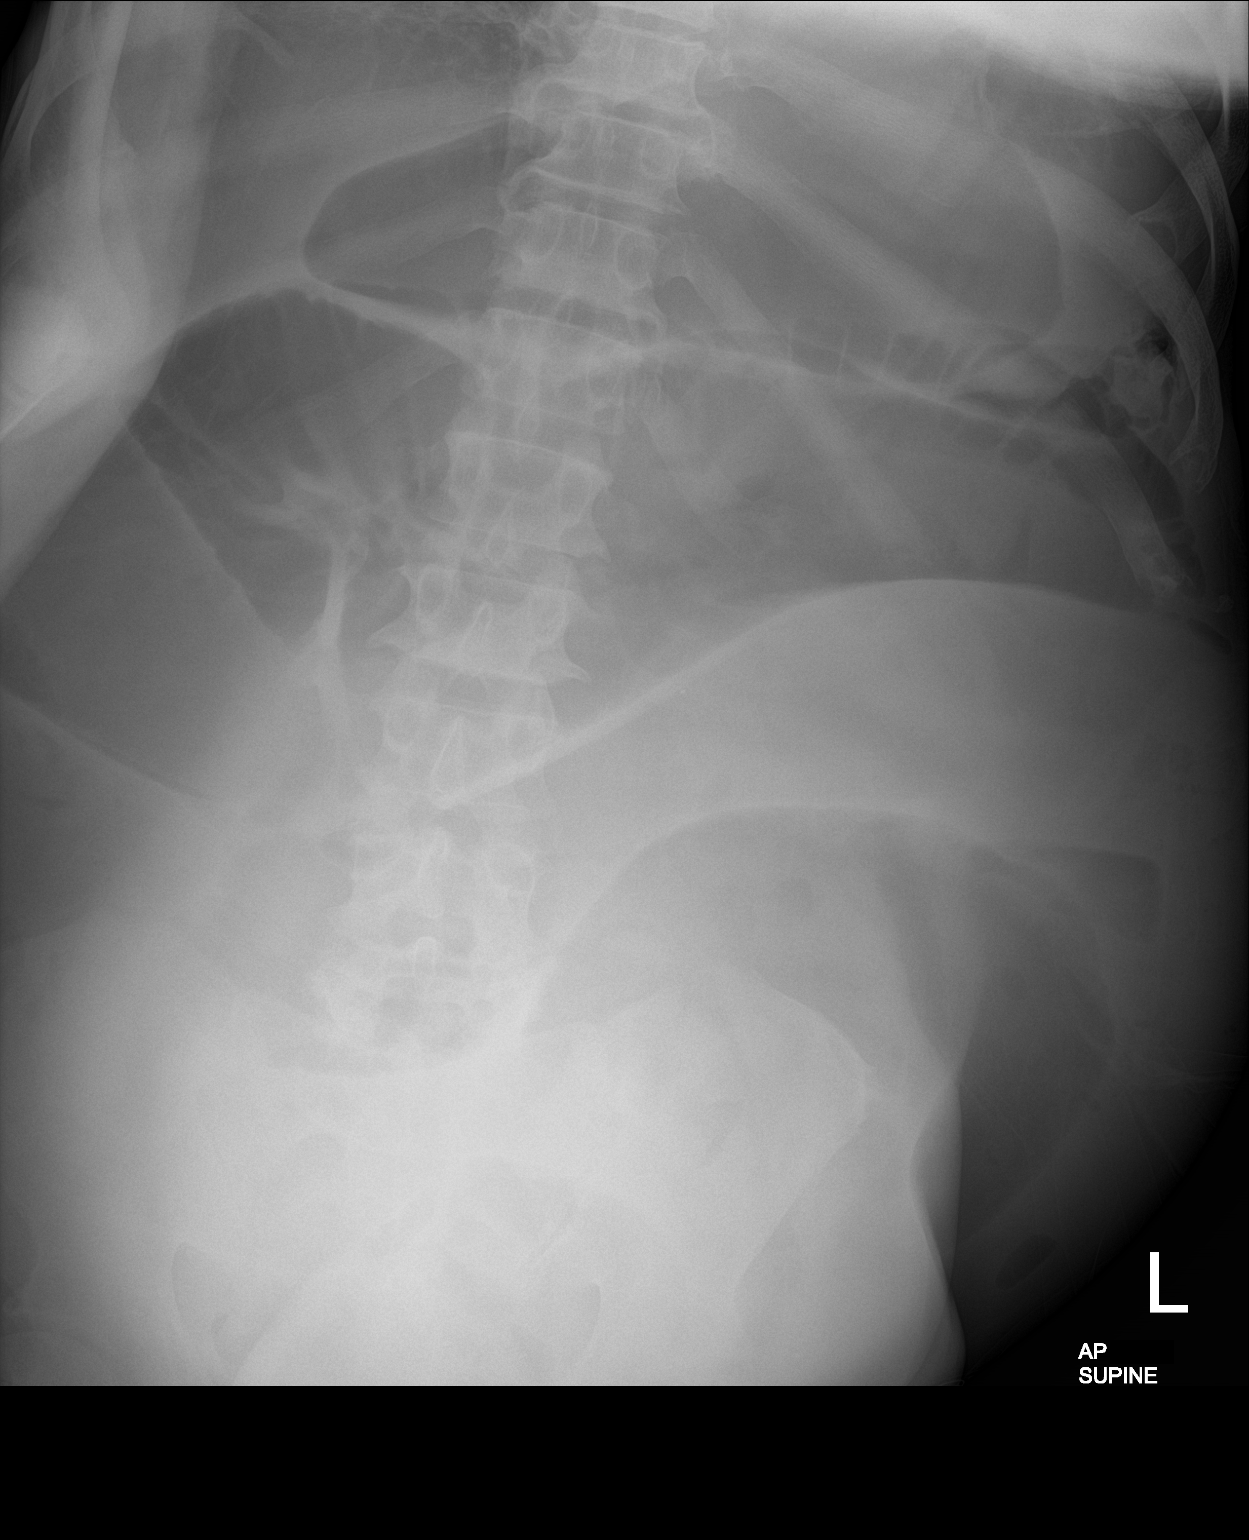

[2 of 2 positions shown; findings below may reference images not displayed]

FINDINGS: Multiple dilated bowel loops identified. Most is appears to be small
bowel. Ground-glass density of the abdomen may represent ascites. No
NG tube identified in the stomach.
IMPRESSION: Diffuse bowel dilatation likely small bowel. Possible small bowel
obstruction. Probable ascites. Consider CT abdomen pelvis for
further evaluation.

## 2021-04-28 MED ORDER — TRAVASOL 10 % IV SOLN
INTRAVENOUS | Status: AC
  Filled 2021-04-28: qty 1142.4

## 2021-04-28 NOTE — Progress Notes (Addendum)
Patient ID: Logan Nelson, male   DOB: 30-Apr-1965, 56 y.o.   MRN: [id number] ?[facility] ?Progress Note ? ?   ?Subjective: ?Doesn't really complain of abdominal pain today.  Says he's eating well and denies nausea, but no PO is documented in the chart (unclear of accuracy here).  Isn't very talkative.  ADDENDUM: I heard the RN walk in with nausea medicine while I was writing my note.  Patient is not a good historian) ? ?Objective: ?Vital signs in last 24 hours: ?Temp:  [97.6 ?F (36.4 ?C)-98.5 ?F (36.9 ?C)] 98.2 ?F (36.8 ?C) (03/22 0901) ?Pulse Rate:  [81-92] 81 (03/22 0901) ?Resp:  [18] 18 (03/22 0901) ?BP: (119-132)/(89-95) 119/91 (03/22 0901) ?SpO2:  [96 %-100 %] 97 % (03/22 0901) ?Last BM Date : 04/27/21 ? ?Intake/Output from previous day: ?03/21 0701 - 03/22 0700 ?In: 360.3 [IV Piggyback:360.3] ?Out: 600 [Urine:600] ?Intake/Output this shift: ?No intake/output data recorded. ? ?PE: ?Gen:  Alert, NAD ?Card:  RRR ?Pulm:  CTAB, no W/R/R, rate and effort normal ?Abd: distended but soft, nontender, hernias partially reducible, a lot of fluid sounds almost like he has ascites in his abdomen ?Ext:  2+ edema BLE, calves soft and nontender ?Skin: no rashes noted, warm and dry ? ?Lab Results:  ?Recent Labs  ?  04/28/21 ?0303  ?WBC 6.5  ?HGB 8.0*  ?HCT 24.7*  ?PLT 217  ? ?BMET ?Recent Labs  ?  04/27/21 ?0255 04/28/21 ?0303  ?NA 141 139  ?K 3.3* 4.0  ?CL 106 106  ?CO2 28 29  ?GLUCOSE 116* 107*  ?BUN 25* 24*  ?CREATININE 0.49* 0.34*  ?CALCIUM 7.8* 7.9*  ? ?PT/INR ?No results for input(s): LABPROT, INR in the last 72 hours. ?CMP  ?   ?Component Value Date/Time  ? NA 139 04/28/2021 0303  ? NA 143 12/08/2020 1103  ? K 4.0 04/28/2021 0303  ? CL 106 04/28/2021 0303  ? CO2 29 04/28/2021 0303  ? GLUCOSE 107 (H) 04/28/2021 0303  ? BUN 24 (H) 04/28/2021 0303  ? BUN 14 12/08/2020 1103  ? CREATININE 0.34 (L) 04/28/2021 0303  ? CALCIUM 7.9 (L) 04/28/2021 0303  ? PROT 5.7 (L) 04/26/2021 0405  ? PROT 6.7 12/08/2020 1103   ? ALBUMIN 2.0 (L) 04/26/2021 0405  ? ALBUMIN 3.8 12/08/2020 1103  ? AST 40 04/26/2021 0405  ? ALT 61 (H) 04/26/2021 0405  ? ALKPHOS 107 04/26/2021 0405  ? BILITOT 0.8 04/26/2021 0405  ? BILITOT 0.2 12/08/2020 1103  ? GFRNONAA >60 04/28/2021 0303  ? GFRAA >60 01/23/2019 1523  ? ?Lipase  ?   ?Component Value Date/Time  ? LIPASE 28 12/17/2020 0924  ? ? ? ? ? ?Studies/Results: ?No results found. ? ?Anti-infectives: ?Anti-infectives (From admission, onward)  ? ? None  ? ?  ? ? ? ?Assessment/Plan ?Chronically incarcerated complex ventral hernia ?- Planning for surgery this Thursday or Friday after several days of nutrition. Add compazine for refractory nausea. ?-plain film today to assess distention.  Hasn't had a scan since November of last year. ? ?Severe protein calorie malnutrition - prealbumin 6.2 (3/20) from 5.5. continue TPN ?Anasarca ?Hypokalemia/ hypophosphatemia - replacement ordered by pharmacy, improved today 3/22 ?Anemia of chronic disease and iron deficiency anemia - Hgb 8.0 today.  IV iron replacement today ?Deconditioning - PT eval today.  Fear patient is laying in bed and making himself even weaker prior to major surgical intervention. ? ?ID - none ?FEN - TPN, regular diet, Boost ?VTE - lovenox ?Foley - none ? ? ?  I reviewed last 24 h vitals and pain scores, last 48 h intake and output, and last 24 h labs and trends ? ? LOS: 5 days  ? ? ?Letha Cape, PA-C ?[facility] ?04/28/2021, 9:45 AM ?Please see Amion for pager number during day hours 7:00am-4:30pm ? ?

## 2021-04-28 NOTE — Progress Notes (Signed)
Mobility Specialist Progress Note: ? ? 04/28/21 1253  ?Mobility  ?Activity Refused mobility  ? ?Pt stating he "just got to bed and is not getting up again". Will follow up as time allows.  ? ?Laurene Melendrez ?Mobility Specialist ?Primary Phone [phone] ? ?

## 2021-04-28 NOTE — Plan of Care (Signed)
?  Problem: Activity: ?Goal: Risk for activity intolerance will decrease ?Outcome: Progressing ?  ?Problem: Nutrition: ?Goal: Adequate nutrition will be maintained ?Outcome: Not Progressing ?  ?Problem: Elimination: ?Goal: Will not experience complications related to bowel motility ?Outcome: Progressing ?Goal: Will not experience complications related to urinary retention ?Outcome: Progressing ?  ?Problem: Pain Managment: ?Goal: General experience of comfort will improve ?Outcome: Not Progressing ?  ?Problem: Safety: ?Goal: Ability to remain free from injury will improve ?Outcome: Progressing ?  ?Problem: Skin Integrity: ?Goal: Risk for impaired skin integrity will decrease ?Outcome: Progressing ?  ?

## 2021-04-28 NOTE — Progress Notes (Signed)
PHARMACY - TOTAL PARENTERAL NUTRITION CONSULT NOTE  ? ?Indication:  partial bowel obstruction ? ?Patient Measurements: ?Height: 5' 6.5" (168.9 cm) ?Weight: 63.8 kg (140 lb 10.5 oz) ?IBW/kg (Calculated) : 64.95 ?TPN AdjBW (KG): 63.8 ?Body mass index is 22.36 kg/m?. ? ?Assessment: 56 years of age male with abdominal wall hernias, GERD, and B12 deficiency status post recent admission for severe protein calorie malnutrition who presents as direct admission from surgical office for chronic partial bowel obstruction with nausea, vomiting, inability to eat and weight loss. Per surgeon note, intake has been some Ensures but other wise very poor oral intake. Patient with 40% weight loss in 6 months. Patient admitted for nutritional optimization before hernia repair surgery, possibly 3/24. Pharmacy consulted for TPN.  ? ?Glucose / Insulin:  BG <120; HgB A1c: 4.0 on 04/12/2021, off insulin ?Electrolytes:  K 4 (received 50 mEq IV), CoCa 9.7; phos 2.8 (received 20 mmol IV), others wnl ?Renal: Scr 0.5, BUN 33 up  ?Hepatic: AST/Tbili wnl; ALT 61, TG 74, Albumin 2.0, prealbumin 6.2  ?Intake / Output; MIVF: UOP incompletely charted, Last BM 3/20 ?GI Imaging: none ?GI Surgeries / Procedures: none   ? ?Central access: DL PICC 04/24/2021 ?TPN start date: 04/24/2021 ? ?Nutritional Goals: ?Goal TPN rate is 85 mL/hr (provides 114 g of protein and 2149 kcals per day) ? ?RD Assessment: ?Estimated Needs ?Total Energy Estimated Needs: 2100-2300 ?Total Protein Estimated Needs: 100-115g ?Total Fluid Estimated Needs: 2.1L/day ? ?Current Nutrition:  ?Regular diet for comfort only- abdomen distended  ?TPN  ? ?Plan:  ?Continue TPN at goal 85 mL/hr at 1800 ?Electrolytes in TPN: Continue Na 100 mEq/L, K 55 mEq/L, Mg 10 mEq/L, Phos 18 mmol/L, Ca 4 mEq/L. Cl:Ac 1:1 ?Per RD, remove standard MVI and trace elements in TPN and give PO for more vitamin D repletion   ?Monitor TPN labs daily until stable at goal then on Mon/Thurs ?F/u surgery plan  ? ?Benetta Spar,  PharmD, BCPS, BCCP ?Clinical Pharmacist ? ?Please check AMION for all [place] phone numbers ?After 10:00 PM, call [facility] [phone] ? ?

## 2021-04-29 LAB — PHOSPHORUS: Phosphorus: 3.4 mg/dL (ref 2.5–4.6)

## 2021-04-29 LAB — SURGICAL PCR SCREEN
MRSA, PCR: NEGATIVE
Staphylococcus aureus: POSITIVE — AB

## 2021-04-29 LAB — COMPREHENSIVE METABOLIC PANEL
ALT: 59 U/L — ABNORMAL HIGH (ref 0–44)
AST: 54 U/L — ABNORMAL HIGH (ref 15–41)
Albumin: 1.9 g/dL — ABNORMAL LOW (ref 3.5–5.0)
Alkaline Phosphatase: 106 U/L (ref 38–126)
Anion gap: 3 — ABNORMAL LOW (ref 5–15)
BUN: 22 mg/dL — ABNORMAL HIGH (ref 6–20)
CO2: 29 mmol/L (ref 22–32)
Calcium: 8 mg/dL — ABNORMAL LOW (ref 8.9–10.3)
Chloride: 108 mmol/L (ref 98–111)
Creatinine, Ser: 0.46 mg/dL — ABNORMAL LOW (ref 0.61–1.24)
GFR, Estimated: >60 mL/min (ref >60)
Glucose, Bld: 93 mg/dL (ref 70–99)
Potassium: 3.9 mmol/L (ref 3.5–5.1)
Sodium: 140 mmol/L (ref 135–145)
Total Bilirubin: 0.5 mg/dL (ref 0.3–1.2)
Total Protein: 5.2 g/dL — ABNORMAL LOW (ref 6.5–8.1)

## 2021-04-29 LAB — MAGNESIUM: Magnesium: 2.1 mg/dL (ref 1.7–2.4)

## 2021-04-29 LAB — CBC
HCT: 23.9 % — ABNORMAL LOW (ref 39.0–52.0)
Hemoglobin: 8 g/dL — ABNORMAL LOW (ref 13.0–17.0)
MCH: 31.9 pg (ref 26.0–34.0)
MCHC: 33.5 g/dL (ref 30.0–36.0)
MCV: 95.2 fL (ref 80.0–100.0)
Platelets: 227 10*3/uL (ref 150–400)
RBC: 2.51 MIL/uL — ABNORMAL LOW (ref 4.22–5.81)
RDW: 15.9 % — ABNORMAL HIGH (ref 11.5–15.5)
WBC: 5.1 10*3/uL (ref 4.0–10.5)
nRBC: 0 % (ref 0.0–0.2)

## 2021-04-29 MED ORDER — TRAVASOL 10 % IV SOLN
INTRAVENOUS | Status: AC
  Filled 2021-04-29: qty 1142.4

## 2021-04-29 NOTE — TOC Progression Note (Signed)
Transition of Care (TOC) - Progression Note  ? ? ?Patient Details  ?Name: Logan Nelson ?MRN: [id number] ?Date of Birth: [birth year] ? ?Transition of Care (TOC) CM/SW Contact  ?Marilu Favre, RN ?Phone Number: ?04/29/2021, 12:47 PM ? ?Clinical Narrative:    ? ?TOC Team continuing to follow. Possible surgery tomorrow  ? ? ?Transition of Care (TOC) Screening Note ? ? ?Patient Details  ?Name: Logan Nelson ?Date of Birth: [birth year] ? ? ? ? ? ? ?Transition of Care Department [facility]) has reviewed patient and  will continue to monitor patient advancement through interdisciplinary progression rounds. If new patient transition needs arise, please place a TOC consult. ?  ? ?  ?  ? ?Expected Discharge Plan and Services ?  ?  ?  ?  ?  ?                ?  ?  ?  ?  ?  ?  ?  ?  ?  ?  ? ? ?Social Determinants of Health (SDOH) Interventions ?  ? ?Readmission Risk Interventions ?   ? View : No data to display.  ?  ?  ?  ? ? ?

## 2021-04-29 NOTE — Evaluation (Signed)
Physical Therapy Evaluation ?Patient Details ?Name: Logan RaringDarryl Keith Nelson ?MRN: [id number] ?DOB: 07/23/1965 ?Today's Date: 04/29/2021 ? ?History of Present Illness ? Pt is a 56 y/o male who presents for admission 04/23/21 for total parenteral nurtrition and treatment of malnutrition before hernia repairs (possibly 04/30/2021). PMH significant for anemia, arthritis, dyspnea, GERD, HTN, LE edema, C4-C5 narrowing and R sided weakness, numbness, dysmetria. ?  ?Clinical Impression ? Pt admitted with above diagnosis. Pt currently with functional limitations due to the deficits listed below (see PT Problem List). At the time of PT eval pt was able to perform transfers and ambulation with gross min guard assist and RW for support. Pt fatigues quickly with OOB mobility and is heavily reliant on external support/assist for bed mobility/repositioning. Weakness, sensation/proprioception deficits, and decreased coordination noted in the RUE and could benefit from OT eval. Acutely, pt will benefit from skilled PT to increase their independence and safety with mobility to allow discharge to the venue listed below.      ?   ? ?Recommendations for follow up therapy are one component of a multi-disciplinary discharge planning process, led by the attending physician.  Recommendations may be updated based on patient status, additional functional criteria and insurance authorization. ? ?Follow Up Recommendations Home health PT ? ?  ?Assistance Recommended at Discharge Intermittent Supervision/Assistance  ?Patient can return home with the following ? Assist for transportation;Assistance with cooking/housework ? ?  ?Equipment Recommendations None recommended by PT  ?Recommendations for Other Services ? OT consult  ?  ?Functional Status Assessment Patient has had a recent decline in their functional status and demonstrates the ability to make significant improvements in function in a reasonable and predictable amount of time.  ? ?  ?Precautions /  Restrictions Precautions ?Precautions: Fall ?Precaution Comments: R sided weakness ?Restrictions ?Weight Bearing Restrictions: No  ? ?  ? ?Mobility ? Bed Mobility ?Overal bed mobility: Needs Assistance ?Bed Mobility: Sit to Supine ?  ?  ?  ?Sit to supine: Min assist ?  ?General bed mobility comments: Assist to elevate LE's back up into bed at end of session. Pt with difficulty maneuvering around to reposition due to decreased UE strength. ?  ? ?Transfers ?Overall transfer level: Needs assistance ?Equipment used: Rolling walker (2 wheels) ?Transfers: Sit to/from Stand ?Sit to Stand: Min guard ?  ?  ?  ?  ?  ?General transfer comment: Increased time to prepare for stand>sit. Fair control to descend to EOB. ?  ? ?Ambulation/Gait ?Ambulation/Gait assistance: Min guard ?Gait Distance (Feet): 200 Feet ?Assistive device: Rolling walker (2 wheels) ?Gait Pattern/deviations: Step-through pattern, Decreased stride length, Trunk flexed ?Gait velocity: Decreased ?Gait velocity interpretation: <1.31 ft/sec, indicative of household ambulator ?  ?General Gait Details: Slow, with effortful advancement of LE's as pt fatigued. No overt LOB noted however heavy use of RW required for support. ? ?Stairs ?  ?  ?  ?  ?  ? ?Wheelchair Mobility ?  ? ?Modified Rankin (Stroke Patients Only) ?  ? ?  ? ?Balance Overall balance assessment: Needs assistance ?Sitting-balance support: Feet supported, No upper extremity supported ?Sitting balance-Leahy Scale: Fair ?  ?  ?Standing balance support: During functional activity, Single extremity supported ?Standing balance-Leahy Scale: Fair ?Standing balance comment: pt furniture walking ?  ?  ?  ?  ?  ?  ?  ?  ?  ?  ?  ?   ? ? ? ?Pertinent Vitals/Pain Pain Assessment ?Pain Assessment: Faces ?Faces Pain Scale: Hurts a little bit ?Pain  Location: abdomen ?Pain Descriptors / Indicators: Discomfort ?Pain Intervention(s): Limited activity within patient's tolerance, Monitored during session, Repositioned   ? ? ?Home Living Family/patient expects to be discharged to:: Private residence ?Living Arrangements: Other relatives (grandfather) ?Available Help at Discharge: Available 24 hours/day;Family ?Type of Home: Apartment ?Home Access: Level entry ?  ?  ?  ?Home Layout: One level ?Home Equipment: Cane - single Librarian, academic (2 wheels) ?   ?  ?Prior Function Prior Level of Function : Driving;Needs assist ?  ?  ?  ?  ?  ?  ?Mobility Comments: intermittently uses SPC, states he has been driving and gets to the store ?  ?  ? ? ?Hand Dominance  ? Dominant Hand: Right ? ?  ?Extremity/Trunk Assessment  ? Upper Extremity Assessment ?Upper Extremity Assessment: RUE deficits/detail ?RUE Deficits / Details: Decreased strength, active AROM, sensation, coordination. Full ROM passively. ?RUE Sensation: decreased light touch ?RUE Coordination: decreased fine motor;decreased gross motor ?LUE Deficits / Details: Full ROM passively, strength is generally 4/5, weaker distally vs proximal. ?LUE Sensation: decreased light touch (fingertips) ?  ? ?Lower Extremity Assessment ?Lower Extremity Assessment: RLE deficits/detail;LLE deficits/detail ?RLE Deficits / Details: General weakness bilaterally with decreased sensation/proprioception and decreased ankle AROM into DF. ?RLE Sensation: decreased light touch;decreased proprioception ?RLE Coordination: decreased fine motor;decreased gross motor ?  ? ?Cervical / Trunk Assessment ?Cervical / Trunk Assessment: Kyphotic  ?Communication  ? Communication: Expressive difficulties (difficult to understand at times)  ?Cognition Arousal/Alertness: Awake/alert ?Behavior During Therapy: Flat affect ?Overall Cognitive Status: No family/caregiver present to determine baseline cognitive functioning ?Area of Impairment: Attention, Memory, Following commands, Safety/judgement, Awareness, Problem solving ?  ?  ?  ?  ?  ?  ?  ?  ?  ?Current Attention Level: Selective ?Memory: Decreased recall of  precautions ?Following Commands: Follows one step commands with increased time ?Safety/Judgement: Decreased awareness of safety ?Awareness: Emergent ?Problem Solving: Slow processing, Decreased initiation, Requires verbal cues ?  ?  ?  ? ?  ?General Comments   ? ?  ?Exercises    ? ?Assessment/Plan  ?  ?PT Assessment Patient needs continued PT services  ?PT Problem List Decreased strength;Decreased activity tolerance;Decreased balance;Decreased mobility;Decreased cognition ? ?   ?  ?PT Treatment Interventions DME instruction;Gait training;Functional mobility training;Therapeutic activities;Therapeutic exercise;Balance training;Cognitive remediation;Patient/family education;Stair training   ? ?PT Goals (Current goals can be found in the Care Plan section)  ?Acute Rehab PT Goals ?Patient Stated Goal: Return home at d/c ?PT Goal Formulation: With patient ?Time For Goal Achievement: 05/13/21 ?Potential to Achieve Goals: Good ? ?  ?Frequency Min 3X/week ?  ? ? ?Co-evaluation   ?  ?  ?  ?  ? ? ?  ?AM-PAC PT "6 Clicks" Mobility  ?Outcome Measure Help needed turning from your back to your side while in a flat bed without using bedrails?: None ?Help needed moving from lying on your back to sitting on the side of a flat bed without using bedrails?: A Little ?Help needed moving to and from a bed to a chair (including a wheelchair)?: A Little ?Help needed standing up from a chair using your arms (e.g., wheelchair or bedside chair)?: A Little ?Help needed to walk in hospital room?: A Little ?Help needed climbing 3-5 steps with a railing? : A Little ?6 Click Score: 19 ? ?  ?End of Session Equipment Utilized During Treatment: Gait belt ?Activity Tolerance: Patient limited by fatigue ?Patient left: in bed;with bed alarm set;with call bell/phone within reach ?Nurse  Communication: Mobility status;Patient requests pain meds ?PT Visit Diagnosis: Other abnormalities of gait and mobility (R26.89);Muscle weakness (generalized)  (M62.81);History of falling (Z91.81) ?  ? ?Time: 0370-9643 ?PT Time Calculation (min) (ACUTE ONLY): 17 min ? ? ?Charges:   PT Evaluation ?$PT Eval Moderate Complexity: 1 Mod ?  ?  ?   ? ? ?Conni Slipper, PT, DPT ?Acute Rehabilit

## 2021-04-29 NOTE — Progress Notes (Signed)
Mobility Specialist Progress Note: ? ? 04/29/21 1151  ?Mobility  ?Activity Ambulated with assistance in room  ?Level of Assistance Moderate assist, patient does 50-74%  ?Assistive Device Front wheel walker  ?Distance Ambulated (ft) 30 ft  ?Activity Response Tolerated well  ?$Mobility charge 1 Mobility  ? ?Pt received laying on couch in room. Complaints of a little pain in stomach. On initial stand pt was not able to power up with legs which required modA to come to complete stand, was then contact guard throughout rest of ambulation. Upon exit of room PT was present for her session. Left pt with PT in hallway.  ? ?Quoc Tome ?Mobility Specialist ?Primary Phone [phone] ? ?

## 2021-04-29 NOTE — Progress Notes (Addendum)
Patient ID: Logan Nelson, male   DOB: Jun 28, 1965, 56 y.o.   MRN: [id number] ?[facility] ?Progress Note ? ?   ?Subjective: ?No major complaints or questions this morning. ? ?Objective: ?Vital signs in last 24 hours: ?Temp:  [98 ?F (36.7 ?C)-98.9 ?F (37.2 ?C)] 98 ?F (36.7 ?C) (03/23 0815) ?Pulse Rate:  [73-106] 86 (03/23 0815) ?Resp:  [16-19] 19 (03/23 0815) ?BP: (119-126)/(91-99) 123/99 (03/23 0815) ?SpO2:  [97 %-100 %] 98 % (03/23 0815) ?Last BM Date : 04/28/21 ? ?Intake/Output from previous day: ?03/22 0701 - 03/23 0700 ?In: 2698.3 [I.V.:2107.4; IV Piggyback:590.9] ?Out: 450 [Urine:450] ?Intake/Output this shift: ?No intake/output data recorded. ? ?PE: ?Gen:  Alert, NAD ?Card:  RRR ?Pulm:  CTAB, no W/R/R, rate and effort normal ?Abd: distended but soft, nontender, hernias partially reducible, ?Ext:  2+ edema BLE, calves soft and nontender ?Skin: no rashes noted, warm and dry ? ?Lab Results:  ?Recent Labs  ?  04/28/21 ?0303 04/29/21 ?0234  ?WBC 6.5 5.1  ?HGB 8.0* 8.0*  ?HCT 24.7* 23.9*  ?PLT 217 227  ? ? ?BMET ?Recent Labs  ?  04/28/21 ?0303 04/29/21 ?0234  ?NA 139 140  ?K 4.0 3.9  ?CL 106 108  ?CO2 29 29  ?GLUCOSE 107* 93  ?BUN 24* 22*  ?CREATININE 0.34* 0.46*  ?CALCIUM 7.9* 8.0*  ? ? ?PT/INR ?No results for input(s): LABPROT, INR in the last 72 hours. ?CMP  ?   ?Component Value Date/Time  ? NA 140 04/29/2021 0234  ? NA 143 12/08/2020 1103  ? K 3.9 04/29/2021 0234  ? CL 108 04/29/2021 0234  ? CO2 29 04/29/2021 0234  ? GLUCOSE 93 04/29/2021 0234  ? BUN 22 (H) 04/29/2021 0234  ? BUN 14 12/08/2020 1103  ? CREATININE 0.46 (L) 04/29/2021 0234  ? CALCIUM 8.0 (L) 04/29/2021 0234  ? PROT 5.2 (L) 04/29/2021 0234  ? PROT 6.7 12/08/2020 1103  ? ALBUMIN 1.9 (L) 04/29/2021 0234  ? ALBUMIN 3.8 12/08/2020 1103  ? AST 54 (H) 04/29/2021 0234  ? ALT 59 (H) 04/29/2021 0234  ? ALKPHOS 106 04/29/2021 0234  ? BILITOT 0.5 04/29/2021 0234  ? BILITOT 0.2 12/08/2020 1103  ? GFRNONAA >60 04/29/2021 0234  ? GFRAA >60  01/23/2019 1523  ? ?Lipase  ?   ?Component Value Date/Time  ? LIPASE 28 12/17/2020 0924  ? ? ? ? ? ?Studies/Results: ?DG Abd Portable 1V ? ?Result Date: 04/28/2021 ?CLINICAL DATA:  Abdominal distension EXAM: PORTABLE ABDOMEN - 1 VIEW COMPARISON:  04/11/2021 FINDINGS: Multiple dilated bowel loops identified. Most is appears to be small bowel. Ground-glass density of the abdomen may represent ascites. No NG tube identified in the stomach. IMPRESSION: Diffuse bowel dilatation likely small bowel. Possible small bowel obstruction. Probable ascites. Consider CT abdomen pelvis for further evaluation. Electronically Signed   By: Franchot Gallo M.D.   On: 04/28/2021 14:10   ? ?Anti-infectives: ?Anti-infectives (From admission, onward)  ? ? None  ? ?  ? ? ? ?Assessment/Plan ?Chronically incarcerated umbilical hernia, epigastric hernia and right inguinal hernia ? ?- I recommended open umbilical, epigastric and inguinal hernia repair with mesh.  The procedure itself as well as its risks, benefits and alternatives were discussed with the patient in full.  We discussed the risks of infection, bleeding and damage to nearby structures.  We discussed the risk of fistula, bowel injury, wound complication and recurrent hernia.  We discussed the risks of heart attack, stroke and death.  Mr. Joanell Rising voiced understanding and agreed  that this obstruction needs resolved despite the risks of surgery.  He has been on TPN for a week in the hospital, completed a cardiac evaluation and I feel he is optimized for surgery on this urgent surgical issue.  We will proceed to the operating room in the morning. ? ?Severe protein calorie malnutrition - continue TPN pre and postoperatively ?Anasarca ?Hypokalemia/ hypophosphatemia - improved ?Anemia of chronic disease and iron deficiency anemia - Hgb 8.0 today.  IV iron replacement, type and screen prior to surgery ?Deconditioning - PT ? ?ID - none ?FEN - TPN, regular diet, Boost - NPO at midnight ?VTE -  lovenox ?Foley - none ? ? ?I reviewed last 24 h vitals and pain scores, last 48 h intake and output, and last 24 h labs and trends ? ? LOS: 6 days  ? ? ?Felicie Morn, MD ? ?[facility] ?04/29/2021, 8:35 AM ?Please see Amion for pager number during day hours 7:00am-4:30pm ? ?

## 2021-04-29 NOTE — Progress Notes (Signed)
PHARMACY - TOTAL PARENTERAL NUTRITION CONSULT NOTE  ? ?Indication:  partial bowel obstruction ? ?Patient Measurements: ?Height: 5' 6.5" (168.9 cm) ?Weight: 63.8 kg (140 lb 10.5 oz) ?IBW/kg (Calculated) : 64.95 ?TPN AdjBW (KG): 63.8 ?Body mass index is 22.36 kg/m?. ? ?Assessment: 56 years of age male with abdominal wall hernias, GERD, and B12 deficiency status post recent admission for severe protein calorie malnutrition who presents as direct admission from surgical office for chronic partial bowel obstruction with nausea, vomiting, inability to eat and weight loss. Per surgeon note, intake has been some Ensures but other wise very poor oral intake. Patient with 40% weight loss in 6 months. Patient admitted for nutritional optimization before hernia repair surgery, possibly 3/24. Pharmacy consulted for TPN.  ? ?Glucose / Insulin:  BG <120; HgB A1c: 4.0 on 04/12/2021, off insulin ?Electrolytes:   CoCa 9.5; others wnl ?Renal: Scr 0.46, BUN 22 ?Hepatic: AST/ALT 54/59; Tbili wnl; TG 74, Albumin 1.9, prealbumin 6.2  ?Intake / Output; MIVF: UOP incompletely charted, Last BM 3/20 ?GI Imaging:  ?3/22 KUB: Diffuse bowel dilatation likely small bowel. Possible small bowel obstruction.  ?GI Surgeries / Procedures:  ?3/24 open umbilical, epigastric and inguinal hernia repair with mesh  ? ?Central access: DL PICC 8/65/7846 ?TPN start date: 04/24/2021 ? ?Nutritional Goals: ?Goal TPN rate is 85 mL/hr (provides 114 g of protein and 2149 kcals per day) ? ?RD Assessment: ?Estimated Needs ?Total Energy Estimated Needs: 2100-2300 ?Total Protein Estimated Needs: 100-115g ?Total Fluid Estimated Needs: 2.1L/day ? ?Current Nutrition:  ?Regular diet for comfort only- abdomen distended  ?TPN  ? ?Plan:  ?Continue TPN at goal 85 mL/hr at 1800 ?Electrolytes in TPN: Increase K 55 mEq/L; Decrease Mg 8 mEq/L, Phos 14 mmol/L; Continue Na 100 mEq/L, Ca 4 mEq/L. Cl:Ac 1:1 ?Per RD, remove standard MVI and trace elements in TPN and give PO for more  vitamin D repletion   ?Monitor TPN labs Mon/Thurs and PRN  ?F/u surgery 3/24 ? ?Alphia Moh, PharmD, BCPS, BCCP ?Clinical Pharmacist ? ?Please check AMION for all [facility] phone numbers ?After 10:00 PM, call Main Pharmacy [phone] ? ?

## 2021-04-30 ENCOUNTER — Encounter (HOSPITAL_COMMUNITY): Admission: AD | Discharge status: Against Medical Advice | Payer: Self-pay | Source: Ambulatory Visit

## 2021-04-30 ENCOUNTER — Encounter (HOSPITAL_COMMUNITY): Payer: Self-pay | Admitting: Surgery

## 2021-04-30 ENCOUNTER — Other Ambulatory Visit: Payer: Self-pay

## 2021-04-30 ENCOUNTER — Inpatient Hospital Stay (HOSPITAL_COMMUNITY): Payer: Medicaid Other | Admitting: Anesthesiology

## 2021-04-30 ENCOUNTER — Inpatient Hospital Stay (HOSPITAL_COMMUNITY): Payer: Medicaid Other

## 2021-04-30 DIAGNOSIS — I10 Essential (primary) hypertension: Secondary | ICD-10-CM

## 2021-04-30 DIAGNOSIS — K420 Umbilical hernia with obstruction, without gangrene: Secondary | ICD-10-CM

## 2021-04-30 DIAGNOSIS — J95821 Acute postprocedural respiratory failure: Secondary | ICD-10-CM

## 2021-04-30 DIAGNOSIS — E43 Unspecified severe protein-calorie malnutrition: Secondary | ICD-10-CM

## 2021-04-30 DIAGNOSIS — K4090 Unilateral inguinal hernia, without obstruction or gangrene, not specified as recurrent: Secondary | ICD-10-CM

## 2021-04-30 DIAGNOSIS — K439 Ventral hernia without obstruction or gangrene: Secondary | ICD-10-CM

## 2021-04-30 HISTORY — PX: UMBILICAL HERNIA REPAIR: SHX196

## 2021-04-30 HISTORY — PX: LYSIS OF ADHESION: SHX5961

## 2021-04-30 HISTORY — PX: INGUINAL HERNIA REPAIR: SHX194

## 2021-04-30 LAB — POCT I-STAT EG7
Acid-base deficit: 1 mmol/L (ref 0.0–2.0)
Bicarbonate: 25 mmol/L (ref 20.0–28.0)
Calcium, Ion: 1.19 mmol/L (ref 1.15–1.40)
HCT: 29 % — ABNORMAL LOW (ref 39.0–52.0)
Hemoglobin: 9.9 g/dL — ABNORMAL LOW (ref 13.0–17.0)
O2 Saturation: 76 %
Potassium: 4.8 mmol/L (ref 3.5–5.1)
Sodium: 139 mmol/L (ref 135–145)
TCO2: 26 mmol/L (ref 22–32)
pCO2, Ven: 44.1 mmHg (ref 44–60)
pH, Ven: 7.362 (ref 7.25–7.43)
pO2, Ven: 42 mmHg (ref 32–45)

## 2021-04-30 LAB — POCT I-STAT 7, (LYTES, BLD GAS, ICA,H+H)
Acid-base deficit: 2 mmol/L (ref 0.0–2.0)
Acid-base deficit: 3 mmol/L — ABNORMAL HIGH (ref 0.0–2.0)
Bicarbonate: 23.7 mmol/L (ref 20.0–28.0)
Bicarbonate: 23.1 mmol/L (ref 20.0–28.0)
Calcium, Ion: 1.15 mmol/L (ref 1.15–1.40)
Calcium, Ion: 1.19 mmol/L (ref 1.15–1.40)
HCT: 23 % — ABNORMAL LOW (ref 39.0–52.0)
HCT: 34 % — ABNORMAL LOW (ref 39.0–52.0)
Hemoglobin: 7.8 g/dL — ABNORMAL LOW (ref 13.0–17.0)
Hemoglobin: 11.6 g/dL — ABNORMAL LOW (ref 13.0–17.0)
O2 Saturation: 100 %
O2 Saturation: 100 %
Potassium: 4.1 mmol/L (ref 3.5–5.1)
Potassium: 4.1 mmol/L (ref 3.5–5.1)
Sodium: 141 mmol/L (ref 135–145)
Sodium: 140 mmol/L (ref 135–145)
TCO2: 25 mmol/L (ref 22–32)
TCO2: 24 mmol/L (ref 22–32)
pCO2 arterial: 42.2 mmHg (ref 32–48)
pCO2 arterial: 45.3 mmHg (ref 32–48)
pH, Arterial: 7.358 (ref 7.35–7.45)
pH, Arterial: 7.316 — ABNORMAL LOW (ref 7.35–7.45)
pO2, Arterial: 181 mmHg — ABNORMAL HIGH (ref 83–108)
pO2, Arterial: 260 mmHg — ABNORMAL HIGH (ref 83–108)

## 2021-04-30 LAB — VITAMIN E
Vitamin E (Alpha Tocopherol): 6.4 mg/L — ABNORMAL LOW (ref 7.0–25.1)
Vitamin E(Gamma Tocopherol): 2.1 mg/L (ref 0.5–5.5)

## 2021-04-30 LAB — BASIC METABOLIC PANEL
Anion gap: 4 — ABNORMAL LOW (ref 5–15)
BUN: 25 mg/dL — ABNORMAL HIGH (ref 6–20)
CO2: 22 mmol/L (ref 22–32)
Calcium: 7.3 mg/dL — ABNORMAL LOW (ref 8.9–10.3)
Chloride: 112 mmol/L — ABNORMAL HIGH (ref 98–111)
Creatinine, Ser: 0.48 mg/dL — ABNORMAL LOW (ref 0.61–1.24)
GFR, Estimated: >60 mL/min (ref >60)
Glucose, Bld: 241 mg/dL — ABNORMAL HIGH (ref 70–99)
Potassium: 4.1 mmol/L (ref 3.5–5.1)
Sodium: 138 mmol/L (ref 135–145)

## 2021-04-30 LAB — CBC WITH DIFFERENTIAL/PLATELET
Abs Immature Granulocytes: 0 10*3/uL (ref 0.00–0.07)
Basophils Absolute: 0 10*3/uL (ref 0.0–0.1)
Basophils Relative: 0 %
Eosinophils Absolute: 0 10*3/uL (ref 0.0–0.5)
Eosinophils Relative: 0 %
HCT: 35.9 % — ABNORMAL LOW (ref 39.0–52.0)
Hemoglobin: 12.1 g/dL — ABNORMAL LOW (ref 13.0–17.0)
Lymphocytes Relative: 22 %
Lymphs Abs: 0.4 10*3/uL — ABNORMAL LOW (ref 0.7–4.0)
MCH: 30.6 pg (ref 26.0–34.0)
MCHC: 33.7 g/dL (ref 30.0–36.0)
MCV: 90.7 fL (ref 80.0–100.0)
Monocytes Absolute: 0.1 10*3/uL (ref 0.1–1.0)
Monocytes Relative: 3 %
Neutro Abs: 1.4 10*3/uL — ABNORMAL LOW (ref 1.7–7.7)
Neutrophils Relative %: 75 %
Platelets: 205 10*3/uL (ref 150–400)
RBC: 3.96 MIL/uL — ABNORMAL LOW (ref 4.22–5.81)
RDW: 16.3 % — ABNORMAL HIGH (ref 11.5–15.5)
WBC: 1.9 10*3/uL — ABNORMAL LOW (ref 4.0–10.5)
nRBC: 0 /100 WBC
nRBC: 1.1 % — ABNORMAL HIGH (ref 0.0–0.2)

## 2021-04-30 LAB — PREPARE RBC (CROSSMATCH)

## 2021-04-30 LAB — VITAMIN A: Vitamin A (Retinoic Acid): 10.8 ug/dL — ABNORMAL LOW (ref 20.1–62.0)

## 2021-04-30 LAB — GLUCOSE, CAPILLARY: Glucose-Capillary: 102 mg/dL — ABNORMAL HIGH (ref 70–99)

## 2021-04-30 LAB — CREATININE, SERUM
Creatinine, Ser: 0.5 mg/dL — ABNORMAL LOW (ref 0.61–1.24)
GFR, Estimated: >60 mL/min (ref >60)

## 2021-04-30 IMAGING — DX DG CHEST 1V PORT
1 series · 1 of 1 positions shown · non-contrast
Comparison: [DATE].

CLINICAL DATA: Bilateral inguinal hernia.

EXAM:
PORTABLE CHEST 1 VIEW

[chest ap]
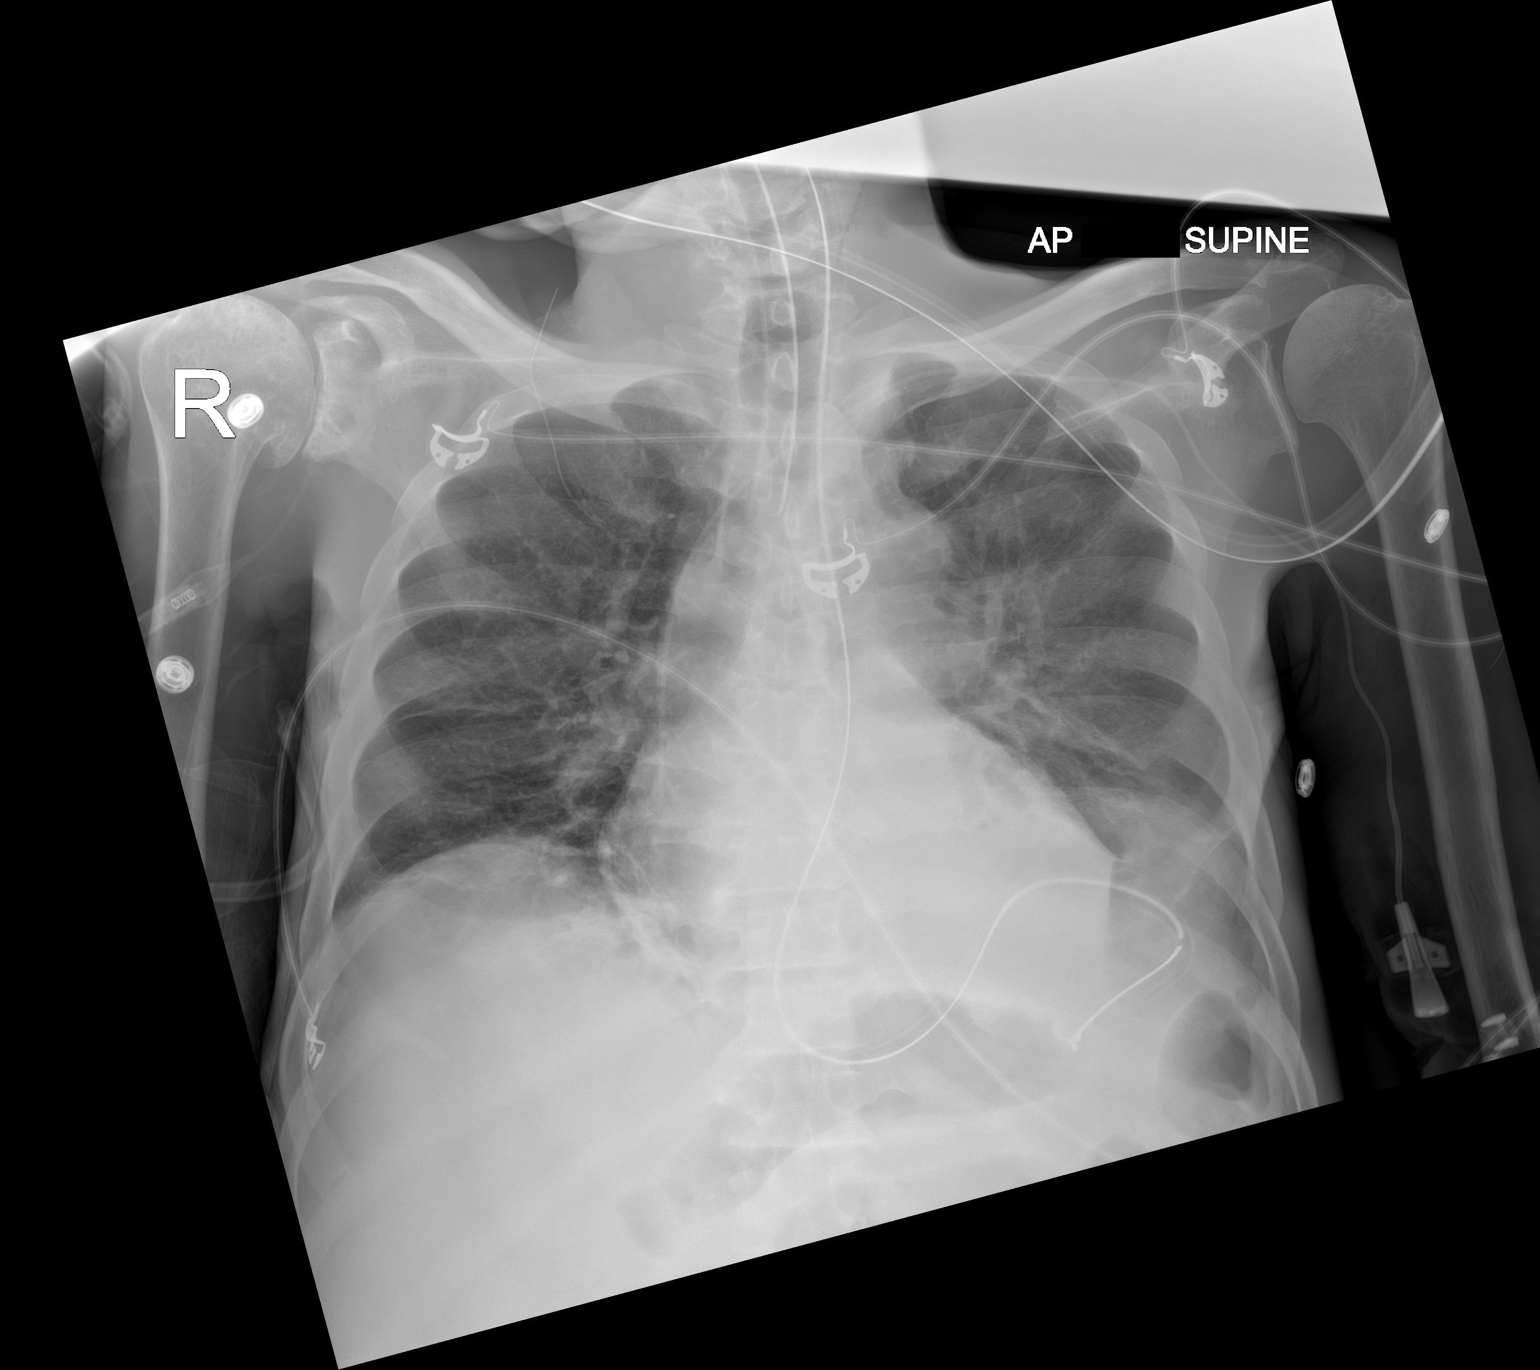

[1 of 1 positions shown; findings below may reference images not displayed]

FINDINGS: Stable cardiomediastinal silhouette. Endotracheal and nasogastric
tubes are unchanged in position. Hypoinflation of the lungs is noted
with minimal bibasilar subsegmental atelectasis. Bony thorax is
unremarkable.
IMPRESSION: Endotracheal and nasogastric tubes are in grossly good position.
Hypoinflation of the lungs with minimal bibasilar subsegmental
atelectasis.

## 2021-04-30 SURGERY — REPAIR, HERNIA, UMBILICAL, ADULT
Anesthesia: General | Laterality: Right

## 2021-04-30 MED ORDER — LACTATED RINGERS IV SOLN: INTRAVENOUS | Status: DC

## 2021-04-30 MED ORDER — ROCURONIUM BROMIDE 10 MG/ML (PF) SYRINGE
PREFILLED_SYRINGE | INTRAVENOUS | Status: AC
  Filled 2021-04-30: qty 10

## 2021-04-30 MED ORDER — HYDROMORPHONE HCL 1 MG/ML IJ SOLN: 1.0000 mg | INTRAMUSCULAR | Status: DC | PRN

## 2021-04-30 MED ORDER — FENTANYL 2500MCG IN NS 250ML (10MCG/ML) PREMIX INFUSION
50.0000 ug/h | INTRAVENOUS | Status: DC
  Administered 2021-04-30: 50 ug/h via INTRAVENOUS
  Filled 2021-04-30: qty 250

## 2021-04-30 MED ORDER — SODIUM CHLORIDE 0.9 % IV SOLN
Freq: Once | INTRAVENOUS | Status: DC
Start: 2021-04-30 — End: 2021-05-01

## 2021-04-30 MED ORDER — PHENYLEPHRINE HCL (PRESSORS) 10 MG/ML IV SOLN
INTRAVENOUS | Status: AC
  Filled 2021-04-30: qty 1

## 2021-04-30 MED ORDER — SODIUM CHLORIDE 0.9% IV SOLUTION: Freq: Once | INTRAVENOUS | Status: AC

## 2021-04-30 MED ORDER — HYDROMORPHONE HCL 1 MG/ML IJ SOLN: 1.0000 mg | Freq: Once | INTRAMUSCULAR | Status: DC

## 2021-04-30 MED ORDER — SODIUM CHLORIDE 0.9 % IV SOLN
3.0000 g | Freq: Four times a day (QID) | INTRAVENOUS | Status: AC
  Administered 2021-04-30 – 2021-05-04 (×18): 3 g via INTRAVENOUS
  Filled 2021-04-30 (×18): qty 8

## 2021-04-30 MED ORDER — MUPIROCIN 2 % EX OINT
1.0000 "application " | TOPICAL_OINTMENT | Freq: Two times a day (BID) | CUTANEOUS | Status: AC
  Administered 2021-04-30 – 2021-05-04 (×8): 1 via NASAL
  Filled 2021-04-30 (×4): qty 22

## 2021-04-30 MED ORDER — BUPIVACAINE LIPOSOME 1.3 % IJ SUSP
INTRAMUSCULAR | Status: DC | PRN
  Administered 2021-04-30: 20 mL

## 2021-04-30 MED ORDER — VASOPRESSIN 20 UNIT/ML IV SOLN
INTRAVENOUS | Status: DC | PRN
  Administered 2021-04-30 (×5): 1 [IU] via INTRAVENOUS

## 2021-04-30 MED ORDER — FENTANYL BOLUS VIA INFUSION
50.0000 ug | INTRAVENOUS | Status: DC | PRN
  Filled 2021-04-30: qty 100

## 2021-04-30 MED ORDER — CEFAZOLIN SODIUM-DEXTROSE 2-3 GM-%(50ML) IV SOLR
INTRAVENOUS | Status: DC | PRN
  Administered 2021-04-30: 2 g via INTRAVENOUS

## 2021-04-30 MED ORDER — PROPOFOL 1000 MG/100ML IV EMUL
0.0000 ug/kg/min | INTRAVENOUS | Status: DC
  Administered 2021-04-30: 45 ug/kg/min via INTRAVENOUS
  Administered 2021-04-30: 50 ug/kg/min via INTRAVENOUS
  Administered 2021-05-01: 30 ug/kg/min via INTRAVENOUS
  Filled 2021-04-30 (×2): qty 100

## 2021-04-30 MED ORDER — DEXAMETHASONE SODIUM PHOSPHATE 10 MG/ML IJ SOLN
INTRAMUSCULAR | Status: AC
  Filled 2021-04-30: qty 1

## 2021-04-30 MED ORDER — CHLORHEXIDINE GLUCONATE CLOTH 2 % EX PADS
6.0000 | MEDICATED_PAD | Freq: Every day | CUTANEOUS | Status: AC
  Administered 2021-05-03 – 2021-05-04 (×2): 6 via TOPICAL

## 2021-04-30 MED ORDER — SUCCINYLCHOLINE CHLORIDE 200 MG/10ML IV SOSY
PREFILLED_SYRINGE | INTRAVENOUS | Status: AC
  Filled 2021-04-30: qty 20

## 2021-04-30 MED ORDER — POLYETHYLENE GLYCOL 3350 17 G PO PACK
17.0000 g | PACK | Freq: Every day | ORAL | Status: DC
  Filled 2021-04-30: qty 1

## 2021-04-30 MED ORDER — 0.9 % SODIUM CHLORIDE (POUR BTL) OPTIME
TOPICAL | Status: DC | PRN
  Administered 2021-04-30: 1000 mL

## 2021-04-30 MED ORDER — MIDAZOLAM HCL 2 MG/2ML IJ SOLN: 2.0000 mg | INTRAMUSCULAR | Status: DC | PRN

## 2021-04-30 MED ORDER — ENOXAPARIN SODIUM 40 MG/0.4ML IJ SOSY
40.0000 mg | PREFILLED_SYRINGE | INTRAMUSCULAR | Status: DC
  Administered 2021-05-01 – 2021-05-05 (×5): 40 mg via SUBCUTANEOUS
  Filled 2021-04-30 (×5): qty 0.4

## 2021-04-30 MED ORDER — VASOPRESSIN 20 UNIT/ML IV SOLN
INTRAVENOUS | Status: AC
  Filled 2021-04-30: qty 1

## 2021-04-30 MED ORDER — PROPOFOL 10 MG/ML IV BOLUS
INTRAVENOUS | Status: AC
  Filled 2021-04-30: qty 20

## 2021-04-30 MED ORDER — PHENYLEPHRINE HCL-NACL 20-0.9 MG/250ML-% IV SOLN
INTRAVENOUS | Status: DC | PRN
  Administered 2021-04-30: 20 ug/min via INTRAVENOUS

## 2021-04-30 MED ORDER — DOCUSATE SODIUM 50 MG/5ML PO LIQD
100.0000 mg | Freq: Two times a day (BID) | ORAL | Status: DC
  Filled 2021-04-30: qty 10

## 2021-04-30 MED ORDER — CHLORHEXIDINE GLUCONATE 0.12 % MT SOLN
15.0000 mL | Freq: Two times a day (BID) | OROMUCOSAL | Status: DC
  Administered 2021-04-30 – 2021-05-04 (×8): 15 mL via OROMUCOSAL
  Filled 2021-04-30 (×2): qty 15

## 2021-04-30 MED ORDER — BUPIVACAINE LIPOSOME 1.3 % IJ SUSP
INTRAMUSCULAR | Status: AC
  Filled 2021-04-30: qty 20

## 2021-04-30 MED ORDER — TRAVASOL 10 % IV SOLN
INTRAVENOUS | Status: AC
  Filled 2021-04-30: qty 1142.4

## 2021-04-30 MED ORDER — PANTOPRAZOLE SODIUM 40 MG IV SOLR: 40.0000 mg | Freq: Every day | INTRAVENOUS | Status: DC

## 2021-04-30 MED ORDER — ORAL CARE MOUTH RINSE
15.0000 mL | Freq: Two times a day (BID) | OROMUCOSAL | Status: DC
  Administered 2021-05-01 – 2021-05-03 (×5): 15 mL via OROMUCOSAL

## 2021-04-30 MED ORDER — FENTANYL CITRATE (PF) 250 MCG/5ML IJ SOLN
INTRAMUSCULAR | Status: DC | PRN
  Administered 2021-04-30: 50 ug via INTRAVENOUS
  Administered 2021-04-30: 100 ug via INTRAVENOUS
  Administered 2021-04-30: 50 ug via INTRAVENOUS

## 2021-04-30 MED ORDER — FENTANYL CITRATE (PF) 250 MCG/5ML IJ SOLN
INTRAMUSCULAR | Status: AC
  Filled 2021-04-30: qty 5

## 2021-04-30 MED ORDER — PROPOFOL 500 MG/50ML IV EMUL
INTRAVENOUS | Status: DC | PRN
  Administered 2021-04-30: 50 ug/kg/min via INTRAVENOUS

## 2021-04-30 MED ORDER — BUPIVACAINE HCL (PF) 0.25 % IJ SOLN
INTRAMUSCULAR | Status: DC | PRN
  Administered 2021-04-30: 30 mL

## 2021-04-30 MED ORDER — PHENYLEPHRINE 40 MCG/ML (10ML) SYRINGE FOR IV PUSH (FOR BLOOD PRESSURE SUPPORT)
PREFILLED_SYRINGE | INTRAVENOUS | Status: DC | PRN
  Administered 2021-04-30 (×2): 120 ug via INTRAVENOUS

## 2021-04-30 MED ORDER — FENTANYL CITRATE PF 50 MCG/ML IJ SOSY
50.0000 ug | PREFILLED_SYRINGE | Freq: Once | INTRAMUSCULAR | Status: AC
  Administered 2021-04-30: 50 ug via INTRAVENOUS
  Filled 2021-04-30: qty 1

## 2021-04-30 MED ORDER — ENOXAPARIN SODIUM 40 MG/0.4ML IJ SOSY
40.0000 mg | PREFILLED_SYRINGE | INTRAMUSCULAR | Status: DC
Start: 2021-04-30 — End: 2021-04-30

## 2021-04-30 MED ORDER — ROCURONIUM BROMIDE 10 MG/ML (PF) SYRINGE
PREFILLED_SYRINGE | INTRAVENOUS | Status: DC | PRN
Start: 2021-04-30 — End: 2021-04-30
  Administered 2021-04-30 (×4): 50 mg via INTRAVENOUS

## 2021-04-30 MED ORDER — EPHEDRINE 5 MG/ML INJ
INTRAVENOUS | Status: AC
  Filled 2021-04-30: qty 5

## 2021-04-30 MED ORDER — SODIUM CHLORIDE 0.9% IV SOLUTION: Freq: Once | INTRAVENOUS | Status: DC

## 2021-04-30 MED ORDER — SODIUM CHLORIDE 0.9 % IV SOLN: INTRAVENOUS | Status: DC | PRN

## 2021-04-30 MED ORDER — MIDAZOLAM HCL 2 MG/2ML IJ SOLN
INTRAMUSCULAR | Status: AC
Start: 2021-04-30 — End: ?
  Filled 2021-04-30: qty 2

## 2021-04-30 MED ORDER — ROCURONIUM BROMIDE 10 MG/ML (PF) SYRINGE
PREFILLED_SYRINGE | INTRAVENOUS | Status: AC
  Filled 2021-04-30: qty 40

## 2021-04-30 MED ORDER — BUPIVACAINE-EPINEPHRINE (PF) 0.25% -1:200000 IJ SOLN
INTRAMUSCULAR | Status: AC
  Filled 2021-04-30: qty 30

## 2021-04-30 MED ORDER — ALBUMIN HUMAN 5 % IV SOLN: INTRAVENOUS | Status: DC | PRN

## 2021-04-30 MED ORDER — LIDOCAINE 2% (20 MG/ML) 5 ML SYRINGE
INTRAMUSCULAR | Status: AC
  Filled 2021-04-30: qty 10

## 2021-04-30 MED ORDER — PROPOFOL 10 MG/ML IV BOLUS
INTRAVENOUS | Status: DC | PRN
  Administered 2021-04-30: 100 mg via INTRAVENOUS

## 2021-04-30 MED ORDER — SODIUM CHLORIDE 0.9 % IV SOLN
0.5000 mg/h | INTRAVENOUS | Status: DC
  Filled 2021-04-30: qty 5

## 2021-04-30 MED ORDER — PHENYLEPHRINE 40 MCG/ML (10ML) SYRINGE FOR IV PUSH (FOR BLOOD PRESSURE SUPPORT)
PREFILLED_SYRINGE | INTRAVENOUS | Status: AC
  Filled 2021-04-30: qty 20

## 2021-04-30 MED ORDER — ONDANSETRON HCL 4 MG/2ML IJ SOLN
INTRAMUSCULAR | Status: AC
  Filled 2021-04-30: qty 2

## 2021-04-30 MED ORDER — BUPIVACAINE-EPINEPHRINE 0.5% -1:200000 IJ SOLN
INTRAMUSCULAR | Status: AC
  Filled 2021-04-30: qty 1

## 2021-04-30 MED ORDER — BUPIVACAINE HCL (PF) 0.25 % IJ SOLN
INTRAMUSCULAR | Status: AC
  Filled 2021-04-30: qty 30

## 2021-04-30 MED ORDER — HYDROMORPHONE BOLUS VIA INFUSION
0.2500 mg | INTRAVENOUS | Status: DC | PRN
  Filled 2021-04-30: qty 2

## 2021-04-30 MED ORDER — CHLORHEXIDINE GLUCONATE 0.12 % MT SOLN
OROMUCOSAL | Status: AC
  Administered 2021-04-30: 15 mL
  Filled 2021-04-30: qty 15

## 2021-04-30 SURGICAL SUPPLY — 50 items
ADH SKN CLS APL DERMABOND .7 (GAUZE/BANDAGES/DRESSINGS) ×3
APL PRP STRL LF DISP 70% ISPRP (MISCELLANEOUS) ×3
BAG COUNTER SPONGE SURGICOUNT (BAG) IMPLANT
BAG SPNG CNTER NS LX DISP (BAG)
BLADE CLIPPER SURG (BLADE) IMPLANT
CANISTER SUCT 3000ML PPV (MISCELLANEOUS) IMPLANT
CHLORAPREP W/TINT 26 (MISCELLANEOUS) ×5 IMPLANT
CLEANER TIP ELECTROSURG 2X2 (MISCELLANEOUS) ×1 IMPLANT
COVER SURGICAL LIGHT HANDLE (MISCELLANEOUS) ×5 IMPLANT
DECANTER SPIKE VIAL GLASS SM (MISCELLANEOUS) ×5 IMPLANT
DERMABOND ADVANCED (GAUZE/BANDAGES/DRESSINGS) ×1
DERMABOND ADVANCED .7 DNX12 (GAUZE/BANDAGES/DRESSINGS) ×4 IMPLANT
DRAIN PENROSE 1/2X12 LTX STRL (WOUND CARE) IMPLANT
DRAIN PENROSE 12X.25 LTX STRL (MISCELLANEOUS) ×1 IMPLANT
DRAPE LAPAROTOMY 100X72 PEDS (DRAPES) ×5 IMPLANT
DRAPE LAPAROTOMY TRNSV 102X78 (DRAPES) ×5 IMPLANT
DRSG OPSITE POSTOP 4X12 (GAUZE/BANDAGES/DRESSINGS) ×1 IMPLANT
ELECT REM PT RETURN 9FT ADLT (ELECTROSURGICAL) ×4
ELECTRODE REM PT RTRN 9FT ADLT (ELECTROSURGICAL) ×4 IMPLANT
GLOVE SRG 8 PF TXTR STRL LF DI (GLOVE) ×4 IMPLANT
GLOVE SURG ENC MOIS LTX SZ7.5 (GLOVE) ×5 IMPLANT
GLOVE SURG SIGNA 7.5 PF LTX (GLOVE) ×5 IMPLANT
GLOVE SURG UNDER POLY LF SZ8 (GLOVE) ×4
GOWN STRL REUS W/ TWL LRG LVL3 (GOWN DISPOSABLE) ×4 IMPLANT
GOWN STRL REUS W/ TWL XL LVL3 (GOWN DISPOSABLE) ×4 IMPLANT
GOWN STRL REUS W/TWL LRG LVL3 (GOWN DISPOSABLE) ×4
GOWN STRL REUS W/TWL XL LVL3 (GOWN DISPOSABLE) ×4
HANDLE SUCTION POOLE (INSTRUMENTS) IMPLANT
KIT BASIN OR (CUSTOM PROCEDURE TRAY) ×5 IMPLANT
KIT TURNOVER KIT B (KITS) ×5 IMPLANT
NDL HYPO 25GX1X1/2 BEV (NEEDLE) ×4 IMPLANT
NEEDLE HYPO 25GX1X1/2 BEV (NEEDLE) ×8 IMPLANT
NS IRRIG 1000ML POUR BTL (IV SOLUTION) ×5 IMPLANT
PACK GENERAL/GYN (CUSTOM PROCEDURE TRAY) ×5 IMPLANT
PAD ARMBOARD 7.5X6 YLW CONV (MISCELLANEOUS) ×5 IMPLANT
PENCIL SMOKE EVACUATOR (MISCELLANEOUS) ×5 IMPLANT
SPONGE GAUZE 2X2 8PLY STRL LF (GAUZE/BANDAGES/DRESSINGS) ×1 IMPLANT
SPONGE T-LAP 18X18 ~~LOC~~+RFID (SPONGE) ×6 IMPLANT
SUCTION POOLE HANDLE (INSTRUMENTS) ×4
SUT MNCRL AB 4-0 PS2 18 (SUTURE) ×2 IMPLANT
SUT MON AB 4-0 PC3 18 (SUTURE) ×4 IMPLANT
SUT PDS AB 2-0 CT2 27 (SUTURE) ×10 IMPLANT
SUT STRATAFIX 1PDS 45CM VIOLET (SUTURE) ×2 IMPLANT
SUT VIC AB 2-0 CT1 18 (SUTURE) ×4 IMPLANT
SUT VIC AB 2-0 SH 18 (SUTURE) ×9 IMPLANT
SUT VIC AB 3-0 CT1 27 (SUTURE)
SUT VIC AB 3-0 CT1 TAPERPNT 27 (SUTURE) ×4 IMPLANT
SYR CONTROL 10ML LL (SYRINGE) ×5 IMPLANT
TOWEL GREEN STERILE (TOWEL DISPOSABLE) ×5 IMPLANT
TOWEL GREEN STERILE FF (TOWEL DISPOSABLE) ×5 IMPLANT

## 2021-04-30 NOTE — Progress Notes (Signed)
Patient from OR. Patients daughter and her mother came to visit the mother states that he has no other family the daughter is 79. The daughters mother states that she is the only contact but they are not together. Nolene Bernheim is the mother of the patients daughter. She states to contact her for any information. Deanna Artis was very emotional crying stating that he has no where to go once he leaves the hospital. She states that he was living with Chalmers Cater however Mr Beverely Pace states that he can not come back to live at his home. I will consult social worker.  ?

## 2021-04-30 NOTE — Anesthesia Procedure Notes (Signed)
Procedure Name: Intubation ?Date/Time: 04/30/2021 9:43 AM ?Performed by: Griffin Dakin, CRNA ?Pre-anesthesia Checklist: Patient identified, Emergency Drugs available, Suction available and Patient being monitored ?Patient Re-evaluated:Patient Re-evaluated prior to induction ?Oxygen Delivery Method: Circle system utilized ?Preoxygenation: Pre-oxygenation with 100% oxygen ?Induction Type: IV induction ?Ventilation: Mask ventilation without difficulty ?Laryngoscope Size: Glidescope and 4 ?Grade View: Grade I ?Tube type: Oral ?Tube size: 7.5 mm ?Number of attempts: 1 ?Airway Equipment and Method: Video-laryngoscopy and Rigid stylet ?Placement Confirmation: ETT inserted through vocal cords under direct vision, positive ETCO2 and breath sounds checked- equal and bilateral ?Secured at: 24 cm ?Tube secured with: Tape ?Dental Injury: Teeth and Oropharynx as per pre-operative assessment  ? ? ? ? ?

## 2021-04-30 NOTE — Progress Notes (Signed)
Pharmacy Antibiotic Note ? ?Logan Nelson is a 56 y.o. male admitted on 04/23/2021 with hernias.  Patient underwent hernia repair with LoA on 3/24.  Pharmacy has been consulted for Unasyn dosing for aspiration PNA. ? ?Renal function stable, afebrile, WBC WNL. ? ?Plan: ?Unasyn 3gm IV Q6H ?Pharmacy will sign off with stable renal fxn.  Thank you for the consult! ? ?Height: 5' 6.5" (168.9 cm) ?Weight: 63.8 kg (140 lb 10.5 oz) ?IBW/kg (Calculated) : 64.95 ? ?Temp (24hrs), Avg:98.2 ?F (36.8 ?C), Min:97.8 ?F (36.6 ?C), Max:98.8 ?F (37.1 ?C) ? ?Recent Labs  ?Lab 04/23/21 ?1801 04/24/21 ?0429 04/26/21 ?0405 04/27/21 ?0255 04/28/21 ?0303 04/29/21 ?0234 04/30/21 ?8366  ?WBC 4.4  --   --   --  6.5 5.1  --   ?CREATININE 0.53*   < > 0.49* 0.49* 0.34* 0.46* 0.50*  ? < > = values in this interval not displayed.  ?  ?Estimated Creatinine Clearance: 91.9 mL/min (A) (by C-G formula based on SCr of 0.5 mg/dL (L)).   ? ?No Known Allergies ? ?Unasyn 3/24 >>  ? ?3/24 TA -  ? ?Jolicia Delira D. Laney Potash, PharmD, BCPS, BCCCP ?04/30/2021, 4:29 PM ? ?

## 2021-04-30 NOTE — Anesthesia Postprocedure Evaluation (Signed)
Anesthesia Post Note ? ?Patient: Logan Nelson ? ?Procedure(s) Performed: OPEN UMBILICAL AND EPIGASTRIC HERNIA REPAIR ?HERNIA REPAIR INGUINAL WITH MESH (Right) ?LYSIS OF ADHESION ? ?  ? ?Patient location during evaluation: SICU ?Anesthesia Type: General ?Level of consciousness: sedated ?Pain management: pain level controlled ?Vital Signs Assessment: post-procedure vital signs reviewed and stable ?Respiratory status: patient remains intubated per anesthesia plan ?Cardiovascular status: stable ?Anesthetic complications: no ? ? ?No notable events documented. ? ?Last Vitals:  ?Vitals:  ? 04/30/21 0815 04/30/21 1600  ?BP: (!) 130/99 (!) 140/102  ?Pulse: 63 69  ?Resp: 18 16  ?Temp: 36.6 ?C   ?SpO2: 99% 100%  ?  ?Last Pain:  ?Vitals:  ? 04/30/21 0829  ?TempSrc:   ?PainSc: 0-No pain  ? ? ?  ?  ?  ?  ?  ?  ? ?Logan Nelson ? ? ? ? ?

## 2021-04-30 NOTE — Progress Notes (Signed)
Pt with intermittent hypotension and tachycardia on of propofol gtt. Limited IV access due to malnutrition and hypothermia. Dilaudid gtt ordered for pain mgmt, but not compatible with Prop. Would like to wean pt off of prop for  accurate assessment and proper pain mgmt. MD notified of need for pain mgmt change from Dilaudid to Fentanyl. Orders received and administered. RN to continue to monitor for changes.  ?

## 2021-04-30 NOTE — Transfer of Care (Signed)
Immediate Anesthesia Transfer of Care Note ? ?Patient: Donato Studley ? ?Procedure(s) Performed: OPEN UMBILICAL AND EPIGASTRIC HERNIA REPAIR ?HERNIA REPAIR INGUINAL WITH MESH (Right) ?LYSIS OF ADHESION ? ?Patient Location: ICU ? ?Anesthesia Type:General ? ?Level of Consciousness: Patient remains intubated per anesthesia plan ? ?Airway & Oxygen Therapy: Patient remains intubated per anesthesia plan and Patient placed on Ventilator (see vital sign flow sheet for setting) ? ?Post-op Assessment: Report given to RN and Post -op Vital signs reviewed and stable ? ?Post vital signs: Reviewed and stable ? ?Last Vitals:  ?Vitals Value Taken Time  ?BP 119/92 04/30/21 1549  ?Temp    ?Pulse 73 04/30/21 1552  ?Resp 16 04/30/21 1555  ?SpO2 100 % 04/30/21 1552  ?Vitals shown include unvalidated device data. ? ?Last Pain:  ?Vitals:  ? 04/30/21 0829  ?TempSrc:   ?PainSc: 0-No pain  ?   ? ?Patients Stated Pain Goal: 3 (04/30/21 6381) ? ?Complications: No notable events documented. ?

## 2021-04-30 NOTE — Progress Notes (Signed)
Responded to consult for IV. Pt currently has DL PICC placed by IV team. Based on current medications and limited veins, recommend considering exchange to triple lumen. RN made aware. ?

## 2021-04-30 NOTE — Consult Note (Signed)
? ?NAME:  Logan Nelson, MRN:  [id number], DOB:  05/16/1965, LOS: 7 ?ADMISSION DATE:  04/23/2021, CONSULTATION DATE:  04/30/2021 ? ?REFERRING MD:  CCS, stechschulte, CHIEF COMPLAINT:  post op vent management  ? ?History of Present Illness:  ?56 year old man with chronically incarcerated ventral and inguinal hernias and partial obstruction.  He had severe protein calorie malnutrition with weight loss with a recent admission to internal medicine service.  He could not tolerate oral intake as outpatient.  He was admitted electively on 3/17 started on TNA via a left upper extremity PICC line.  He underwent exploratory laparotomy with repair of umbilical, epigastric and right inguinal hernia .  Lysis of additions required 4 hours, 7 L of fluid was aspirated, he received 3 units of PRBC, required pressors.  There was concern for aspiration during the procedure and he was left intubated and transferred to the ICU.  There was no enterotomy during the procedure ? ?Pertinent  Medical History  ?Severe protein calorie malnutrition ?Hypertension ? ?Significant Hospital Events: ?Including procedures, antibiotic start and stop dates in addition to other pertinent events   ?3/24 lysis of adhesions, right inguinal hernia repair with mesh, open incarcerated incisional hernia repair ? ?Interim History / Subjective:  ?Critically ill, intubated ?Sedated on propofol ?On 75 mics of Neo-Synephrine ? ?Objective   ?Blood pressure (!) 140/102, pulse 69, temperature 97.9 ?F (36.6 ?C), temperature source Oral, resp. rate 16, height 5' 6.5" (1.689 m), weight 63.8 kg, SpO2 100 %. ?   ?Vent Mode: PRVC ?FiO2 (%):  [100 %] 100 % ?Set Rate:  [16 bmp] 16 bmp ?Vt Set:  [510 mL] 510 mL ?PEEP:  [5 cmH20] 5 cmH20 ?Plateau Pressure:  [17 cmH20] 17 cmH20  ? ?Intake/Output Summary (Last 24 hours) at 04/30/2021 1623 ?Last data filed at 04/30/2021 1509 ?Gross per 24 hour  ?Intake 5439.74 ml  ?Output 7800 ml  ?Net -2360.26 ml  ? ?Filed Weights  ? 04/23/21 1212  04/30/21 0815  ?Weight: 63.8 kg 63.8 kg  ? ? ?Examination: ?General: Malnourished, well-built man sedated, no distress, orally intubated ?HENT: Oral ETT, mild pallor, no icterus, no JVD ?Lungs: Decreased breath sounds bilateral, no accessory muscle use ?Cardiovascular: S1-S2 regular, no murmur ?Abdomen: Distended, midline bandage, suprapubic incision with drain, absent bowel sounds ?Extremities: No deformity, no edema ?Neuro: Sedated, RASS -3 on propofol ?GU: Clear urine ? ?Chest x-ray independently reviewed shows ET tube in position, left basilar infiltrate, no effusions ? ?Labs show normal electrolytes, albumin 1.9, no leukocytosis, hemoglobin stable at 8 ? ?Resolved Hospital Problem list   ? ? ?Assessment & Plan:  ?Postop acute respiratory failure ?Concern for aspiration pneumonia ?Smoker, possible COPD ? ?-Vent settings reviewed and adjusted ?-Await ABG ?-Spontaneous breathing trial starting in a.m. with goal extubation ?-Use albuterol nebs as needed ?-Empiric Unasyn , check respiratory culture ?-For sedation, use propofol and Dilaudid with goal RASS 0 to -1 ? ?Postop hypotension -on Neo-Synephrine drip , titrate to off ?-Continue LR at 100 cc an hour ? ?Acute on chronic blood loss anemia  ?-Check CBC ?-Goal transfusion for hemoglobin less than 7 ? ?Severe protein calorie malnutrition ?-Continue TNA ? ? ? ? ? ?Best Practice (right click and "Reselect all SmartList Selections" daily)  ? ?Diet/type: TPN ?DVT prophylaxis: LMWH ?GI prophylaxis: PPI ?Lines: Central line ?Foley:  N/A ?Code Status:  full code ?Last date of multidisciplinary goals of care discussion [NA] no HCPOA identified, he has an underage daughter, separated from his daughter's mother 4 years but she/her  family has taken care of him during his illness -updated her at bedside ? ?Labs   ?CBC: ?Recent Labs  ?Lab 04/23/21 ?1801 04/28/21 ?0303 04/29/21 ?0234  ?WBC 4.4 6.5 5.1  ?HGB 8.8* 8.0* 8.0*  ?HCT 26.1* 24.7* 23.9*  ?MCV 90.0 95.0 95.2  ?PLT 252  217 227  ? ? ?Basic Metabolic Panel: ?Recent Labs  ?Lab 04/25/21 ?0435 04/26/21 ?0405 04/27/21 ?0255 04/28/21 ?0303 04/29/21 ?0234 04/30/21 ?1740  ?NA 138 136 141 139 140  --   ?K 3.6 3.7 3.3* 4.0 3.9  --   ?CL 106 105 106 106 108  --   ?CO2 25 25 28 29 29   --   ?GLUCOSE 105* 105* 116* 107* 93  --   ?BUN 17 33* 25* 24* 22*  --   ?CREATININE 0.54* 0.49* 0.49* 0.34* 0.46* 0.50*  ?CALCIUM 7.3* 7.7* 7.8* 7.9* 8.0*  --   ?MG 1.5* 2.2 2.0 2.0 2.1  --   ?PHOS 2.9 3.3 2.4* 2.8 3.4  --   ? ?GFR: ?Estimated Creatinine Clearance: 91.9 mL/min (A) (by C-G formula based on SCr of 0.5 mg/dL (L)). ?Recent Labs  ?Lab 04/23/21 ?1801 04/28/21 ?0303 04/29/21 ?0234  ?WBC 4.4 6.5 5.1  ? ? ?Liver Function Tests: ?Recent Labs  ?Lab 04/23/21 ?1801 04/24/21 ?0429 04/25/21 ?0435 04/26/21 ?0405 04/29/21 ?0234  ?AST 46* 34 49* 40 54*  ?ALT 64* 52* 62* 61* 59*  ?ALKPHOS 95 81 105 107 106  ?BILITOT 0.8 0.6 0.7 0.8 0.5  ?PROT 5.0* 4.1* 5.0* 5.7* 5.2*  ?ALBUMIN 2.0* 1.6* 1.9* 2.0* 1.9*  ? ?No results for input(s): LIPASE, AMYLASE in the last 168 hours. ?No results for input(s): AMMONIA in the last 168 hours. ? ?ABG ?No results found for: PHART, PCO2ART, PO2ART, HCO3, TCO2, ACIDBASEDEF, O2SAT  ? ?Coagulation Profile: ?Recent Labs  ?Lab 04/23/21 ?1801  ?INR 1.2  ? ? ?Cardiac Enzymes: ?No results for input(s): CKTOTAL, CKMB, CKMBINDEX, TROPONINI in the last 168 hours. ? ?HbA1C: ?Hgb A1c MFr Bld  ?Date/Time Value Ref Range Status  ?04/12/2021 03:45 AM 4.0 (L) 4.8 - 5.6 % Final  ?  Comment:  ?  (NOTE) ?Pre diabetes:          5.7%-6.4% ? ?Diabetes:              >6.4% ? ?Glycemic control for   <7.0% ?adults with diabetes ?  ? ? ?CBG: ?Recent Labs  ?Lab 04/26/21 ?04/28/21 04/26/21 ?1135 04/26/21 ?1816 04/26/21 ?2252 04/30/21 ?0818  ?GLUCAP 118* 107* 119* 111* 102*  ? ? ?Review of Systems:   ?Unable to obtain since intubated and sedated ? ?Past Medical History:  ?He,  has a past medical history of Anemia, Arthritis, Bradycardia, Dyspnea, Falling episodes, GERD  (gastroesophageal reflux disease), Hernia of abdominal wall, Hypertension, and Lower extremity edema.  ? ?Surgical History:  ? ?Past Surgical History:  ?Procedure Laterality Date  ? ABDOMINAL SURGERY    ?  ? ?Social History:  ? reports that he quit smoking about a year ago. His smoking use included cigarettes. He has never used smokeless tobacco. He reports that he does not currently use alcohol. He reports that he does not currently use drugs.  ? ?Family History:  ?His family history is not on file.  ? ?Allergies ?No Known Allergies  ? ?Home Medications  ?Prior to Admission medications   ?Medication Sig Start Date End Date Taking? Authorizing Provider  ?aspirin 81 MG chewable tablet Chew 1 tablet (81 mg total) by mouth daily. ?Patient not  taking: Reported on 04/23/2021 04/14/21 05/14/21  Steffanie Rainwater, MD  ?feeding supplement (ENSURE ENLIVE / ENSURE PLUS) LIQD Take 237 mLs by mouth 3 (three) times daily between meals. ?Patient not taking: Reported on 04/23/2021 04/14/21   Steffanie Rainwater, MD  ?Multiple Vitamins-Minerals (CERTAVITE/ANTIOXIDANTS) TABS Take 1 tablet by mouth daily. ?Patient not taking: Reported on 04/23/2021 04/14/21 05/14/21  Steffanie Rainwater, MD  ?ondansetron (ZOFRAN) 4 MG tablet Take 1 tablet (4 mg total) by mouth every 8 (eight) hours as needed for nausea or vomiting. ?Patient not taking: Reported on 04/23/2021 04/14/21 04/14/22  Steffanie Rainwater, MD  ?Vitamin D, Ergocalciferol, (DRISDOL) 1.25 MG (50000 UNIT) CAPS capsule Take 1 capsule (50,000 Units total) by mouth every 7 (seven) days. ?Patient not taking: Reported on 04/23/2021 04/20/21 05/20/21  Steffanie Rainwater, MD  ?  ? ?Critical care time: 49 m ?  ? ?Cyril Mourning MD. Tonny Bollman. ?[facility] ?Pager : [phone] ? ?If no response to pager , please call [phone] until 7 pm ?After 7:00 pm call Elink  [phone]  ? ?04/30/2021 ? ? ? ? ?

## 2021-04-30 NOTE — Progress Notes (Signed)
OT Cancellation Note ? ?Patient Details ?Name: Isai Gottlieb ?MRN: [id number] ?DOB: 14-Jul-1965 ? ? ?Cancelled Treatment:    Reason Eval/Treat Not Completed: Patient at procedure or test/ unavailable (pt in surgery) ? ?Evern Bio ?04/30/2021, 8:27 AM ?Martie Round, OTR/L ?[facility] Services ?Pager: [phone] ?Office: [phone]  ?

## 2021-04-30 NOTE — Anesthesia Preprocedure Evaluation (Signed)
Anesthesia Evaluation  ?Patient identified by MRN, date of birth, ID band ?Patient awake ? ? ? ?Reviewed: ?Allergy & Precautions, H&P , NPO status , Patient's Chart, lab work & pertinent test results ? ?Airway ?Mallampati: II ? ? ?Neck ROM: full ? ? ? Dental ?  ?Pulmonary ?shortness of breath, Patient abstained from smoking., former smoker,  ?  ?breath sounds clear to auscultation ? ? ? ? ? ? Cardiovascular ?hypertension,  ?Rhythm:regular Rate:Normal ? ? ?  ?Neuro/Psych ?CVA   ? GI/Hepatic ?GERD  ,  ?Endo/Other  ? ? Renal/GU ?  ? ?  ?Musculoskeletal ? ?(+) Arthritis ,  ? Abdominal ?  ?Peds ? Hematology ? ?(+) Blood dyscrasia, anemia , H/H 8.0/23.9   ?Anesthesia Other Findings ? ? Reproductive/Obstetrics ? ?  ? ? ? ? ? ? ? ? ? ? ? ? ? ?  ?  ? ? ? ? ? ? ? ? ?Anesthesia Physical ?Anesthesia Plan ? ?ASA: 3 ? ?Anesthesia Plan: General  ? ?Post-op Pain Management:   ? ?Induction: Intravenous ? ?PONV Risk Score and Plan: 2 and Ondansetron, Dexamethasone, Midazolam and Treatment may vary due to age or medical condition ? ?Airway Management Planned: Oral ETT ? ?Additional Equipment:  ? ?Intra-op Plan:  ? ?Post-operative Plan: Extubation in OR ? ?Informed Consent: I have reviewed the patients History and Physical, chart, labs and discussed the procedure including the risks, benefits and alternatives for the proposed anesthesia with the patient or authorized representative who has indicated his/her understanding and acceptance.  ? ? ? ?Dental advisory given ? ?Plan Discussed with: CRNA, Anesthesiologist and Surgeon ? ?Anesthesia Plan Comments:   ? ? ? ? ? ? ?Anesthesia Quick Evaluation ? ?

## 2021-04-30 NOTE — Progress Notes (Signed)
PHARMACY - TOTAL PARENTERAL NUTRITION CONSULT NOTE  ? ?Indication:  partial bowel obstruction ? ?Patient Measurements: ?Height: 5' 6.5" (168.9 cm) ?Weight: 63.8 kg (140 lb 10.5 oz) ?IBW/kg (Calculated) : 64.95 ?TPN AdjBW (KG): 63.8 ?Body mass index is 22.36 kg/m?. ? ?Assessment: 56 years of age male with abdominal wall hernias, GERD, and B12 deficiency status post recent admission for severe protein calorie malnutrition who presents as direct admission from surgical office for chronic partial bowel obstruction with nausea, vomiting, inability to eat and weight loss. Per surgeon note, intake has been some Ensures but other wise very poor oral intake. Patient with 40% weight loss in 6 months. Patient admitted for nutritional optimization before hernia repair surgery, going for surgery 3/24. Pharmacy consulted for TPN.  ? ?Glucose / Insulin:  BG <120; HgB A1c: 4.0 on 04/12/2021, off insulin ?Electrolytes:  No labs today ?Renal: Scr 0.46, BUN 22 ?Hepatic: AST/ALT 54/59; Tbili wnl; TG 74, Albumin 1.9, prealbumin 6.2  ?Intake / Output; MIVF: UOP incompletely charted, Last BM 3/22 ?GI Imaging:  ?3/22 KUB: Diffuse bowel dilatation likely small bowel. Possible small bowel obstruction.  ?GI Surgeries / Procedures:  ?[id number] open umbilical, epigastric and inguinal hernia repair with mesh  ? ?Central access: DL PICC 04/24/2021 ?TPN start date: 04/24/2021 ? ?Nutritional Goals: ?Goal TPN rate is 85 mL/hr (provides 114 g of protein and 2149 kcals per day) ? ?RD Assessment: ?Estimated Needs ?Total Energy Estimated Needs: 2100-2300 ?Total Protein Estimated Needs: 100-115g ?Total Fluid Estimated Needs: 2.1L/day ? ?Current Nutrition:  ?Regular diet for comfort only- abdomen distended  ?TPN  ? ?Plan:  ?Continue TPN at goal 85 mL/hr at 1800 ?Electrolytes in TPN: Continue K 55 mEq/L, Mg 8 mEq/L, Phos 14 mmol/L Na 100 mEq/L, Ca 4 mEq/L. Cl:Ac 1:1 ?Per RD, remove standard MVI and trace elements in TPN and give PO for more vitamin D repletion    ?Monitor TPN labs Mon/Thurs and PRN  ? ?Sherlon Handing, PharmD, BCPS ?Please see amion for complete clinical pharmacist phone list ?04/30/2021 8:32 AM ? ?

## 2021-04-30 NOTE — Progress Notes (Addendum)
eLink Physician-Brief Progress Note ?Patient Name: Vinnie Gombert ?DOB: 06/03/1965 ?MRN: [id number] ? ? ?Date of Service ? 04/30/2021  ?HPI/Events of Note ? Bedside RN requesting the order for Dilaudid gtt be changed to Fentanyl gtt so it will be compatable with Propofol. Pt has limited access and TPN running. They have not been able to get a PIV  ?eICU Interventions ? - changed to fentanyl gtt.   ? ? ? ?Intervention Category ?Intermediate Interventions: Other: (narcotic swithc) ? ?Ranee Gosselin ?04/30/2021, 9:42 PM ? ?00:34 ?Pt need Q4 CBG checks ? - ordered ? ?pt is also now tachy 120-130's, BP soft in the 90's. Temp is now 101.2 oral. Has no fluids going.  ?Also not pain related, bedside has given PRN ? ?May you change PRN tylenol to suppository  ?- changed.  ?- wait for fever control for tachycardia ? ?4:02 ?Camera eval for soft MAP. ?Discussed with RN.received 2 lit of fluids in this shift. Was on neo yesterday AM. ?UOP ok, but decreasing. ?Art line waveform not showing any pressure flctuations. MAP now at 71. ?- will re start low dose neosynephrine. Has a PICC line, ? ? ?

## 2021-04-30 NOTE — Op Note (Signed)
? ?Patient: Logan Nelson (1965/08/28, [id number]) ? ?Date of Surgery: 04/30/21 ? ?Preoperative Diagnosis: Chronically incarcerated periumbilical incisional hernia, epigastric incisional hernia, right inguinal hernia  ? ?Postoperative Diagnosis: Chronically incarcerated periumbilical incisional hernia, epigastric incisional hernia, right inguinal hernia  ? ?Surgical Procedure:  ?Open incarcerated incisional hernia repair (Multiple defects measuring 9.5cm tall x 7 cm wide ?Primary right inguinal hernia repair with mesh Drexel Iha) ?Lysis of adhesions (4 hours) ? ?Operative Team Members:  ?Surgeon(s) and Role: ?   * Amyjo Mizrachi, Hyman Hopes, MD - Primary  ? ?Anesthesiologist: Achille Rich, MD; Lucretia Kern, MD ?CRNA: Jodell Cipro, CRNA; Macie Burows, CRNA; Epifanio Lesches, CRNA  ? ?Anesthesia: General  ? ?Fluids:  ?Total I/O ?In: 3510 [I.V.:1250; Blood:1260; IV Piggyback:1000] ?Out: 7600 [Urine:400; Other:7000; Blood:200] ? ?Complications: None ? ?Drains:  Penrose drain in the subcutaneous space exiting the inferior aspect of the incisions   ? ?Specimen: None ? ?Disposition:  ICU - intubated and hemodynamically stable. ? ?Plan of Care:  Continue inpatient care ? ? ? ?Indications for Procedure: Logan Nelson is a 56 y.o. male who presented with a chronically incarcerated periumbilical incisional hernia, epigastric incisional hernia, and right inguinal hernia.  He was optimized for surgery with preoperative TPN and then we proceeded. ? ?The procedure itself as well as its risks, benefits and alternatives were discussed.  The risks discussed included but were not limited to the risk of infection, bleeding, damage to nearby structures, stroke, heart attack, death and recurrent hernia.  After a full discussion and all questions answered the patient granted consent to proceed. ? ?Findings:  ?Adhesions throughout the entire abdomen ?Midline periumbilical/epigastric hernia measuring 9.5cm tall x 7 cm wide  closed primarily with #1 Stratafix suture ?Primary right inguinal hernia repair with mesh Drexel Iha type repair without mesh) ? ?Description of Procedure:  ? ?On the date stated above the patient was taken operating room suite placed supine position.  General endotracheal anesthesia was induced.  A timeout was completed verifying the correct patient, procedure, positioning, and equipment needed for the case.  Preoperative antibiotics were given. ? ?I made a midline incision through the previous midline laparotomy incision.  I dissected down through the skin and subcutaneous tissues and identified the hernia sac.  I was careful to avoid injuring the bowel which was visible peristalsing through the skin.  I entered the abdomen and encountered dense adhesions throughout the abdomen.  I first connected the fascial defects between the epigastric hernia defect and the umbilical hernia defect by dividing the fascia, and eventually also need to extend the midline laparotomy incision inferiorly as I worked to lyse adhesions throughout the abdomen.  We will followed was a meticulous 4-hour lysis of adhesion freeing the small intestine from the ligament of Treitz to the terminal ileum.  Multiple serosal injuries were repaired using Vicryl suture.  There were no full-thickness enterotomies identified during the surgery.  As I continue to work the right lower quadrant multiple loops of small intestine were herniated down into the scrotum through a direct right inguinal hernia defect.  I took care to reduce these into the abdomen and completed the dissection of the small intestine from the ligament of Treitz to the terminal ileum.  Approximately 7 L of intestinal contents were milked out of the small bowel and evacuated through the nasogastric tube.  At this point been under anesthesia for a long time and we anticipate severe fluid shifts with his malnutrition.  I do not feel continuing  the dissection to place a permanent mesh was  wise in this situation so I decided to repair these hernias primarily.  First I dissected out the right inguinal hernia.  A horizontal incision was made over the external inguinal ring through the skin.  I dissected down through the subcutaneous tissues and identified the inguinal ligament.  The aponeurosis of the external oblique muscle was divided and I entered the inguinal canal.  The hernia had already been pulled back into the abdomen from my midline laparotomy incision.  I identified the cord structures and protected these.  I performed a Bassini style repair sewing the shelving edge of the inguinal ligament to the conjoined tendon using multiple interrupted 2-0 PDS sutures.  I reapproximated the internal inguinal ring around the spermatic cord structures using 2-0 PDS suture.  The aponeurosis of the external oblique was reapproximated using running 2-0 Vicryl suture.  The deep dermal tissues were reapproximated using Vicryl suture.  Monocryl and Dermabond were used to close this right groin incision.   ?Once the inguinal hernia had been repaired the midline laparotomy incision was closed using running #1 strata fix suture incorporating the hernia defects into the midline closure.  The skin attenuated skin overlying the protuberant incisional hernia was excised.  1/4 inch Penrose drain was placed in the subcutaneous space.  Penrose drain was fixed in place using a 2-0 nylon suture.  Vicryl sutures were used to reapproximate the deep dermal layer.  Monocryl was run to close the skin and Dermabond was applied.  Sterile dressing was applied.  All sponge needle counts were correct at the end of this case. ? ?At the end of the case we reviewed the infection status of the case. ?Patient: Logan Nelson Emergency General Surgery Service Patient ?Case: Urgent ?Infection Present At Time Of Surgery (PATOS): None ? ?Ivar Drape, MD ?General, Bariatric, & Minimally Invasive Surgery ?[facility], [place] ? ?

## 2021-04-30 NOTE — Progress Notes (Signed)
Plan for open umbilical and epigastric and right inguinal hernia repair with mesh today.  Procedure itself as well as its risks,  benefits and alternatives again discussed and patient granted consent to proceed.  Will proceed as scheduled. ? ?Felicie Morn, MD ?General, Bariatric and Minimally Invasive Surgery ?[facility], [place] ? ?

## 2021-05-01 ENCOUNTER — Inpatient Hospital Stay (HOSPITAL_COMMUNITY): Payer: Medicaid Other

## 2021-05-01 DIAGNOSIS — E43 Unspecified severe protein-calorie malnutrition: Secondary | ICD-10-CM

## 2021-05-01 DIAGNOSIS — J9601 Acute respiratory failure with hypoxia: Secondary | ICD-10-CM

## 2021-05-01 DIAGNOSIS — D62 Acute posthemorrhagic anemia: Secondary | ICD-10-CM

## 2021-05-01 DIAGNOSIS — R739 Hyperglycemia, unspecified: Secondary | ICD-10-CM

## 2021-05-01 LAB — BPAM RBC
Blood Product Expiration Date: 202304162359
Blood Product Expiration Date: 202304182359
Blood Product Expiration Date: 202304202359
Blood Product Expiration Date: 202304202359
ISSUE DATE / TIME: 202303241043
ISSUE DATE / TIME: 202303241043
ISSUE DATE / TIME: 202303241425
ISSUE DATE / TIME: 202303241425
Unit Type and Rh: 5100
Unit Type and Rh: 5100
Unit Type and Rh: 5100
Unit Type and Rh: 5100

## 2021-05-01 LAB — TYPE AND SCREEN
ABO/RH(D): O POS
Antibody Screen: NEGATIVE
Unit division: 0
Unit division: 0
Unit division: 0
Unit division: 0

## 2021-05-01 LAB — GLUCOSE, CAPILLARY
Glucose-Capillary: 104 mg/dL — ABNORMAL HIGH (ref 70–99)
Glucose-Capillary: 118 mg/dL — ABNORMAL HIGH (ref 70–99)
Glucose-Capillary: 131 mg/dL — ABNORMAL HIGH (ref 70–99)
Glucose-Capillary: 148 mg/dL — ABNORMAL HIGH (ref 70–99)
Glucose-Capillary: 179 mg/dL — ABNORMAL HIGH (ref 70–99)
Glucose-Capillary: 97 mg/dL (ref 70–99)

## 2021-05-01 LAB — BASIC METABOLIC PANEL
Anion gap: 2 — ABNORMAL LOW (ref 5–15)
BUN: 26 mg/dL — ABNORMAL HIGH (ref 6–20)
CO2: 20 mmol/L — ABNORMAL LOW (ref 22–32)
Calcium: 7.2 mg/dL — ABNORMAL LOW (ref 8.9–10.3)
Chloride: 119 mmol/L — ABNORMAL HIGH (ref 98–111)
Creatinine, Ser: 0.45 mg/dL — ABNORMAL LOW (ref 0.61–1.24)
GFR, Estimated: >60 mL/min (ref >60)
Glucose, Bld: 149 mg/dL — ABNORMAL HIGH (ref 70–99)
Potassium: 4.4 mmol/L (ref 3.5–5.1)
Sodium: 141 mmol/L (ref 135–145)

## 2021-05-01 LAB — CBC
HCT: 35.7 % — ABNORMAL LOW (ref 39.0–52.0)
Hemoglobin: 12.3 g/dL — ABNORMAL LOW (ref 13.0–17.0)
MCH: 31.1 pg (ref 26.0–34.0)
MCHC: 34.5 g/dL (ref 30.0–36.0)
MCV: 90.2 fL (ref 80.0–100.0)
Platelets: 184 10*3/uL (ref 150–400)
RBC: 3.96 MIL/uL — ABNORMAL LOW (ref 4.22–5.81)
RDW: 17.3 % — ABNORMAL HIGH (ref 11.5–15.5)
WBC: 6.5 10*3/uL (ref 4.0–10.5)
nRBC: 0 % (ref 0.0–0.2)

## 2021-05-01 LAB — MAGNESIUM: Magnesium: 1.8 mg/dL (ref 1.7–2.4)

## 2021-05-01 LAB — TRIGLYCERIDES: Triglycerides: 33 mg/dL (ref <150)

## 2021-05-01 LAB — PHOSPHORUS: Phosphorus: 2.6 mg/dL (ref 2.5–4.6)

## 2021-05-01 IMAGING — DX DG CHEST 1V PORT
1 series · 1 of 1 positions shown · non-contrast
Comparison: [DATE].

CLINICAL DATA: Acute respiratory failure.  Follow-up exam.

EXAM:
PORTABLE CHEST 1 VIEW

[chest ap]
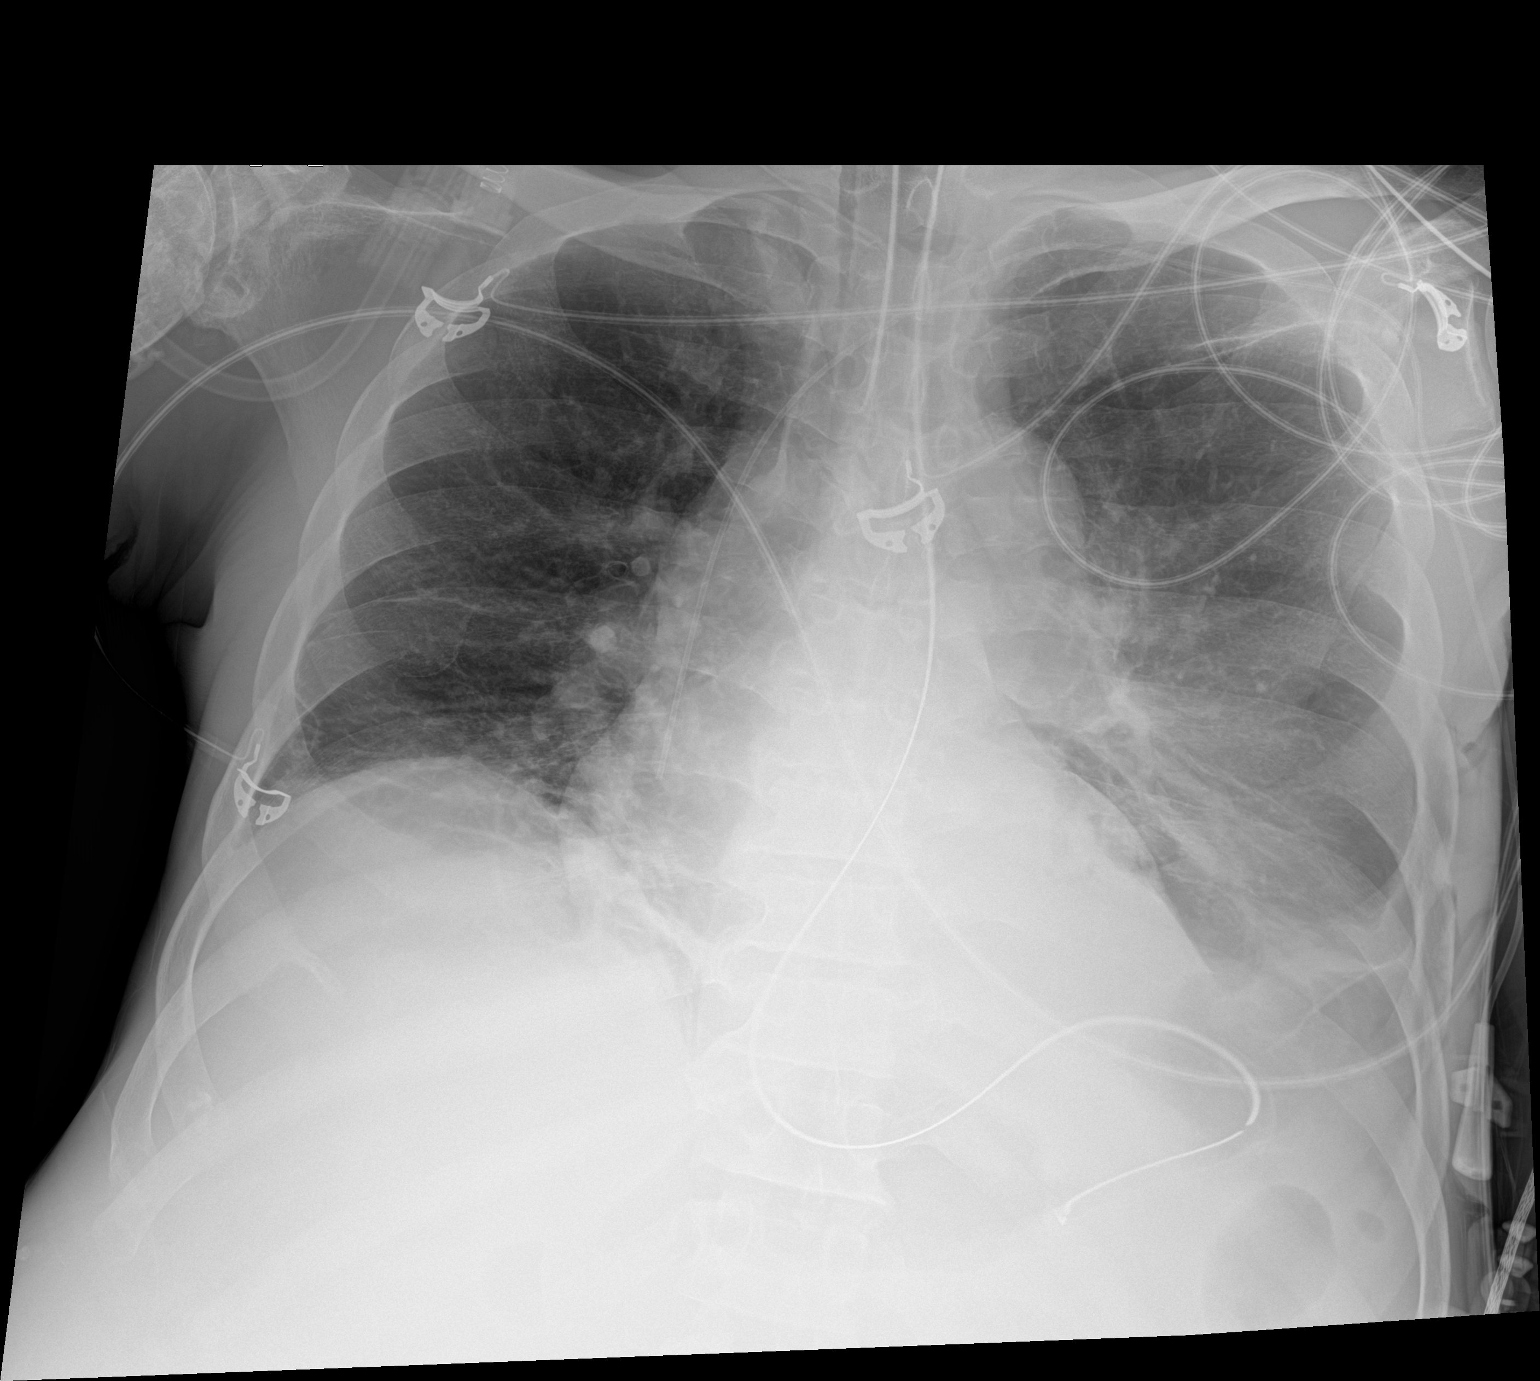

[1 of 1 positions shown; findings below may reference images not displayed]

FINDINGS: Left perihilar and lung base opacity, and mild right lung base
opacity, is without significant change from the previous day's exam.
No new lung abnormalities. No evidence of pulmonary edema.

Possible small effusions.  No pneumothorax.

Endotracheal tube, nasal/orogastric tube and left PICC are stable.
IMPRESSION: 1. No significant change from the previous day's exam.
2. Persistent, left greater than right, lung base opacities,
extending to the perihilar region on the left, consistent with
atelectasis, pneumonia or a combination. Suspect small associated
pleural effusions. No pulmonary edema.
3. Stable well-positioned support apparatus.

## 2021-05-01 MED ORDER — INSULIN ASPART 100 UNIT/ML IJ SOLN: 0.0000 [IU] | Freq: Three times a day (TID) | INTRAMUSCULAR | Status: DC

## 2021-05-01 MED ORDER — ACETAMINOPHEN 650 MG RE SUPP
650.0000 mg | Freq: Three times a day (TID) | RECTAL | Status: DC | PRN
  Administered 2021-05-01: 650 mg via RECTAL
  Filled 2021-05-01: qty 1

## 2021-05-01 MED ORDER — INSULIN ASPART 100 UNIT/ML IJ SOLN: 0.0000 [IU] | Freq: Four times a day (QID) | INTRAMUSCULAR | Status: DC

## 2021-05-01 MED ORDER — PHENYLEPHRINE HCL-NACL 20-0.9 MG/250ML-% IV SOLN
0.0000 ug/min | INTRAVENOUS | Status: DC
  Administered 2021-05-01: 175 ug/min via INTRAVENOUS
  Administered 2021-05-01: 20 ug/min via INTRAVENOUS
  Administered 2021-05-01: 200 ug/min via INTRAVENOUS
  Filled 2021-05-01: qty 500
  Filled 2021-05-01 (×2): qty 250

## 2021-05-01 MED ORDER — FENTANYL CITRATE PF 50 MCG/ML IJ SOSY
25.0000 ug | PREFILLED_SYRINGE | INTRAMUSCULAR | Status: DC | PRN
  Administered 2021-05-01: 100 ug via INTRAVENOUS
  Administered 2021-05-01: 50 ug via INTRAVENOUS
  Administered 2021-05-01 (×2): 100 ug via INTRAVENOUS
  Administered 2021-05-02: 50 ug via INTRAVENOUS
  Administered 2021-05-02: 25 ug via INTRAVENOUS
  Administered 2021-05-02: 100 ug via INTRAVENOUS
  Administered 2021-05-02: 50 ug via INTRAVENOUS
  Administered 2021-05-02: 100 ug via INTRAVENOUS
  Administered 2021-05-03 (×2): 50 ug via INTRAVENOUS
  Administered 2021-05-03: 100 ug via INTRAVENOUS
  Administered 2021-05-03 – 2021-05-04 (×9): 50 ug via INTRAVENOUS
  Administered 2021-05-04: 100 ug via INTRAVENOUS
  Administered 2021-05-04 (×3): 50 ug via INTRAVENOUS
  Administered 2021-05-05 (×2): 100 ug via INTRAVENOUS
  Filled 2021-05-01 (×2): qty 2
  Filled 2021-05-01 (×3): qty 1
  Filled 2021-05-01: qty 2
  Filled 2021-05-01 (×7): qty 1
  Filled 2021-05-01: qty 2
  Filled 2021-05-01 (×2): qty 1
  Filled 2021-05-01: qty 2
  Filled 2021-05-01 (×3): qty 1
  Filled 2021-05-01 (×2): qty 2
  Filled 2021-05-01: qty 1
  Filled 2021-05-01 (×2): qty 2
  Filled 2021-05-01 (×2): qty 1

## 2021-05-01 MED ORDER — TRAVASOL 10 % IV SOLN
INTRAVENOUS | Status: AC
  Filled 2021-05-01: qty 1142.4

## 2021-05-01 NOTE — Progress Notes (Addendum)
? ?NAME:  Fatih Stalvey, MRN:  [id number], DOB:  Jul 28, 1965, LOS: 8 ?ADMISSION DATE:  04/23/2021, CONSULTATION DATE:  05/01/2021 ? ?REFERRING MD:  CCS, stechschulte, CHIEF COMPLAINT:  Post op vent management  ? ?History of Present Illness:  ?56 year old man with chronically incarcerated ventral and inguinal hernias and partial obstruction.  He had severe protein calorie malnutrition with weight loss with a recent admission to internal medicine service.  He could not tolerate oral intake as outpatient.  He was admitted electively on 3/17 started on TNA via a left upper extremity PICC line.  He underwent exploratory laparotomy with repair of umbilical, epigastric and right inguinal hernia, lysis of additions (required 4 hours), 7 L of fluid was aspirated, he received 3 units of PRBC, required pressors.  There was concern for aspiration during the procedure and he was left intubated and transferred to the ICU.  There was no enterotomy during the procedure ? ?Pertinent  Medical History  ?Severe protein calorie malnutrition ?Hypertension ? ?Significant Hospital Events: ?Including procedures, antibiotic start and stop dates in addition to other pertinent events   ?3/17 Admit for TPN in setting of malnutrition due to incarcerated hernias/partial obstruction  ?3/24 lysis of adhesions, right inguinal hernia repair with mesh, open incarcerated incisional hernia repair. Returned to ICU on propofol, neo-synephrine  ? ?Interim History / Subjective:  ?Vent - PS 12, PEEP 5, 40% FiO2  ?Glucose range 131-241  ?Tmax 102.6 in last 24 hours / WBC 6.5  ? ?Objective   ?Blood pressure 111/64, pulse 80, temperature 97.8 ?F (36.6 ?C), temperature source Oral, resp. rate 12, height 5' 6.5" (1.689 m), weight 63.8 kg, SpO2 99 %. ?   ?Vent Mode: PSV;CPAP ?FiO2 (%):  [40 %-100 %] 40 % ?Set Rate:  [16 bmp-18 bmp] 18 bmp ?Vt Set:  [510 mL] 510 mL ?PEEP:  [5 cmH20] 5 cmH20 ?Pressure Support:  [12 cmH20] 12 cmH20 ?Plateau Pressure:  [10 cmH20-17  cmH20] 15 cmH20  ? ?Intake/Output Summary (Last 24 hours) at 05/01/2021 0948 ?Last data filed at 05/01/2021 [phone] ?Gross per 24 hour  ?Intake 5985.78 ml  ?Output 8415 ml  ?Net -2429.22 ml  ? ?Filed Weights  ? 04/23/21 1212 04/30/21 0815  ?Weight: 63.8 kg 63.8 kg  ? ? ?Examination: ?General: chronically ill appearing adult male lying in bed on vent in NAD ?HEENT: MM pink/moist, ETT, gastric tube in place, anicteric ?Neuro: sedate, awakens to voice, nods / able to lift head off pillow, follows commands / moves extremities ?CV: s1s2 RRR, no m/r/g ?PULM: non-labored at rest, lungs bilaterally with good air entry, occasional rhonchi ?GI: soft, bsx4 hypoactive, midline incision with waffle dressing in place, drain noted  ?Extremities: warm/dry, generalized 1+ edema  ?Skin: no rashes or lesions ? ?Resolved Hospital Problem list   ?Post operative Hypotension  ? ?Assessment & Plan:  ? ?Acute Respiratory Failure with Hypoxia ?Concern for aspiration pneumonia ?Smoker, possible COPD ?-PRVC 8cc/kg as rest mode ?-PSV with goal for extubation > meets criteria but concern with baseline malnutrition, abdominal surgery if he will be able to maintain/do the work of breathing ?-VAP prevention measures  ?-PRN albuterol  ?-continue empiric unasyn for possible aspiration  ?-follow respiratory culture  ?-WUA in progress  ? ?Post Operative Open Incarcerated Incisional Hernia Repari  ?Prolonged OR time with lysis of adhesions, multiple hernia repairs  ?-post operative care per CCS ? ?Intermittent Hypotension  ?Suspect sedation related + volume  ?-off neo, remove from [facility] ?-monitor BP trends ? ?Acute Metabolic Encephalopathy  ?  Post op, in setting of sedation  ?-PAD protocol for RASS Goal 0 to -1  ?-minimize sedation as able  ?-delirium prevention measures  ? ?Acute on chronic blood loss anemia  ?-follow CBC ?-transfuse for Hgb <7% or concern for active bleeding  ? ?Severe protein calorie malnutrition (POA) ?-TPN for nutrition  ?-defer timing of  enteral nutrition to CCS ? ?Hyperglycemia ?-glucose goal 140-180  ?-monitor while on TPN  ? ? ? ?Best Practice (right click and "Reselect all SmartList Selections" daily)  ?Diet/type: TPN ?DVT prophylaxis: LMWH ?GI prophylaxis: PPI ?Lines: Central line ?Foley:  N/A ?Code Status:  full code ?Last date of multidisciplinary goals of care discussion: no HCPOA identified, he has an underage daughter, separated from his daughter's mother 4 years but she/her family has taken care of him during his illness.   ? ?No family at bedside 3/25 am.  ? ?Critical care time: 33 minutes  ?  ? ?Canary Brim, MSN, APRN, NP-C, AGACNP-BC ?[facility] ?05/01/2021, 9:49 AM ? ? ?Please see [email or web address] for pager details.  ? ?From 7A-7P if no response, please call [phone] ?After hours, please call Pola Corn [phone] ? ? ? ? ? ?

## 2021-05-01 NOTE — Procedures (Signed)
Extubation Procedure Note ? ?Patient Details:   ?Name: Logan Nelson ?DOB: 1966/01/31 ?MRN: [id number] ?  ?Airway Documentation:  ?  ?Vent end date: 05/01/21 Vent end time: 1426  ? ?Evaluation ? O2 sats: stable throughout ?Complications: No apparent complications ?Patient did tolerate procedure well. ?Bilateral Breath Sounds: Rhonchi, Diminished ?  ?Yes, ? ?Per MD order, pt was extubated to [facility]. Prior to extubation pt did have a positive cuff leak. Pt tolerated well with SVS. No stridor noted. Pt was able to state his name. RN currently at bedside. RT will continue to monitor pt as needed. ? ?Megan Mans ?05/01/2021, 2:38 PM ? ?

## 2021-05-01 NOTE — Progress Notes (Signed)
Physical Therapy Treatment ?Patient Details ?Name: Logan Nelson ?MRN: [id number] ?DOB: 05-14-1965 ?Today's Date: 05/01/2021 ? ? ?History of Present Illness Pt is a 56 y/o male who presents for admission 04/23/21 for total parenteral nurtrition and treatment of malnutrition before hernia repairs. Hernia repair on 04/30/2021. PMH significant for anemia, arthritis, dyspnea, GERD, HTN, LE edema, C4-C5 narrowing and R sided weakness, numbness, dysmetria.  Underwent incarcerated periumbilical incisional hernia, epigastric incisional hernia, right inguinal hernia 3/24.  Sent to ICU post surgery intubated.  Now extubated 3/25. ? ?  ?PT Comments  ? ? PT updates goals and POC based on patient's current level of function after hernia repair on 04/30/2021. Pt requiring increased assistance to stand and transfer, limited by pain, weakness, and fatigue. Pt will benefit form aggressive mobilization and continued acute PT services in an effort to improve activity tolerance and to reduce falls risk. PT recommends AIR admission at this time as the pt demonstrates the potential to return to independent mobility with high intensity inpatient PT services at the time of discharge.   ?Recommendations for follow up therapy are one component of a multi-disciplinary discharge planning process, led by the attending physician.  Recommendations may be updated based on patient status, additional functional criteria and insurance authorization. ? ?Follow Up Recommendations ? Acute inpatient rehab (3hours/day) ?  ?  ?Assistance Recommended at Discharge Intermittent Supervision/Assistance  ?Patient can return home with the following A lot of help with walking and/or transfers;A lot of help with bathing/dressing/bathroom;Assistance with cooking/housework;Assist for transportation;Help with stairs or ramp for entrance;Direct supervision/assist for medications management ?  ?Equipment Recommendations ?  (TBD)  ?  ?Recommendations for Other Services  Rehab consult ? ? ?  ?Precautions / Restrictions Precautions ?Precautions: Fall ?Precaution Comments: R sided weakness, watch HR. NGT to suction, A-line ?Restrictions ?Weight Bearing Restrictions: No  ?  ? ?Mobility ? Bed Mobility ?  ?  ?  ?  ?  ?  ?  ?  ?  ? ?Transfers ?Overall transfer level: Needs assistance ?Equipment used: 1 person hand held assist ?Transfers: Sit to/from Stand, Bed to chair/wheelchair/BSC ?Sit to Stand: Min assist ?  ?Step pivot transfers: Mod assist, Min assist ?  ?  ?  ?General transfer comment: modA for initial step pivot, minA 2nd attempt ?  ? ?Ambulation/Gait ?  ?  ?  ?  ?  ?  ?  ?  ? ? ?Stairs ?  ?  ?  ?  ?  ? ? ?Wheelchair Mobility ?  ? ?Modified Rankin (Stroke Patients Only) ?  ? ? ?  ?Balance Overall balance assessment: Needs assistance ?Sitting-balance support: Single extremity supported, Bilateral upper extremity supported, Feet supported ?Sitting balance-Leahy Scale: Poor ?  ?  ?Standing balance support: Single extremity supported ?Standing balance-Leahy Scale: Poor ?Standing balance comment: minA with unilateral UE support ?  ?  ?  ?  ?  ?  ?  ?  ?  ?  ?  ?  ? ?  ?Cognition Arousal/Alertness: Awake/alert ?Behavior During Therapy: [facility] for tasks assessed/performed ?Overall Cognitive Status: Impaired/Different from baseline ?Area of Impairment: Attention ?  ?  ?  ?  ?  ?  ?  ?  ?  ?Current Attention Level: Alternating ?  ?Following Commands: Follows one step commands consistently ?Safety/Judgement: Decreased awareness of deficits ?  ?  ?  ?  ?  ? ?  ?Exercises   ? ?  ?General Comments General comments (skin integrity, edema, etc.): tachycardic up to 151  observed by PT, poor pleth reading frequently, sats in low 90s with good reading. Pt on 3L [place] ?  ?  ? ?Pertinent Vitals/Pain Pain Assessment ?Pain Assessment: 0-10 ?Pain Score: 10-Worst pain ever ?Pain Location: abdomen ?Pain Descriptors / Indicators: Aching ?Pain Intervention(s): Patient requesting pain meds-RN notified  ? ? ?Home  Living Family/patient expects to be discharged to:: Private residence ?Living Arrangements: Other relatives ?Available Help at Discharge: Available 24 hours/day;Family ?Type of Home: Apartment ?Home Access: Level entry ?  ?  ?  ?Home Layout: One level ?Home Equipment: Cane - single Librarian, academic (2 wheels) ?   ?  ?Prior Function    ?  ?  ?   ? ?PT Goals (current goals can now be found in the care plan section) Acute Rehab PT Goals ?Patient Stated Goal: return to independence ?PT Goal Formulation: With patient ?Time For Goal Achievement: 05/15/21 ?Potential to Achieve Goals: Good ?Progress towards PT goals: Goals downgraded-see care plan ? ?  ?Frequency ? ? ? Min 3X/week ? ? ? ?  ?PT Plan Discharge plan needs to be updated  ? ? ?Co-evaluation   ?  ?  ?  ?  ? ?  ?AM-PAC PT "6 Clicks" Mobility   ?Outcome Measure ? Help needed turning from your back to your side while in a flat bed without using bedrails?: A Lot ?Help needed moving from lying on your back to sitting on the side of a flat bed without using bedrails?: A Lot ?Help needed moving to and from a bed to a chair (including a wheelchair)?: A Little ?Help needed standing up from a chair using your arms (e.g., wheelchair or bedside chair)?: A Little ?Help needed to walk in hospital room?: Total ?Help needed climbing 3-5 steps with a railing? : Total ?6 Click Score: 12 ? ?  ?End of Session Equipment Utilized During Treatment: Oxygen ?Activity Tolerance: Patient limited by pain;Patient limited by fatigue ?Patient left: in chair;with call bell/phone within reach ?Nurse Communication: Mobility status ?PT Visit Diagnosis: Other abnormalities of gait and mobility (R26.89);Muscle weakness (generalized) (M62.81);History of falling (Z91.81) ?  ? ? ?Time: 5784-6962 ?PT Time Calculation (min) (ACUTE ONLY): 28 min ? ?Charges:  $Therapeutic Activity: 23-37 mins          ?          ? ?Arlyss Gandy, PT, DPT ?Acute Rehabilitation ?Pager: [phone] ?Office  [phone] ? ? ? ?Arlyss Gandy ?05/01/2021, 4:27 PM ? ?

## 2021-05-01 NOTE — Progress Notes (Signed)
1 Day Post-Op  ? ?Subjective/Chief Complaint: ?Patient remains intubated, but interactive, eyes open ?No pressors ? ?Objective: ?Vital signs in last 24 hours: ?Temp:  [94.8 ?F (34.9 ?C)-102.6 ?F (39.2 ?C)] 97.8 ?F (36.6 ?C) (03/25 0800) ?Pulse Rate:  [69-129] 106 (03/25 1100) ?Resp:  [11-32] 26 (03/25 1100) ?BP: (91-140)/(64-102) 123/74 (03/25 1100) ?SpO2:  [97 %-100 %] 97 % (03/25 1100) ?Arterial Line BP: (83-146)/(47-89) 107/65 (03/25 1000) ?FiO2 (%):  [40 %-100 %] 40 % (03/25 1100) ?Last BM Date : 04/28/21 ? ?Intake/Output from previous day: ?03/24 0701 - 03/25 0700 ?In: 5587.4 [I.V.:3127.4; Blood:1260; IV Piggyback:1200] ?Out: 8415 [Urine:965; Emesis/NG output:250; Blood:200] ?Intake/Output this shift: ?Total I/O ?In: 1575.9 [I.V.:935.9; IV Piggyback:640] ?Out: -  ? ?Intubated, awake, interactive ?Abd - mildly distended; soft ?Midline incision - intact ?Some minimal drainage from [place] at lower end of incision ?Lab Results:  ?Recent Labs  ?  04/30/21 ?1639 04/30/21 ?1641 05/01/21 ?0640  ?WBC 1.9*  --  6.5  ?HGB 12.1* 11.6* 12.3*  ?HCT 35.9* 34.0* 35.7*  ?PLT 205  --  184  ? ?BMET ?Recent Labs  ?  04/30/21 ?1639 04/30/21 ?1641 05/01/21 ?0640  ?NA 138 140 141  ?K 4.1 4.1 4.4  ?CL 112*  --  119*  ?CO2 22  --  20*  ?GLUCOSE 241*  --  149*  ?BUN 25*  --  26*  ?CREATININE 0.48*  --  0.45*  ?CALCIUM 7.3*  --  7.2*  ? ?PT/INR ?No results for input(s): LABPROT, INR in the last 72 hours. ?ABG ?Recent Labs  ?  04/30/21 ?1418 04/30/21 ?1641  ?PHART 7.358 7.316*  ?HCO3 23.7 23.1  ? ? ?Studies/Results: ?DG Chest Port 1 View ? ?Result Date: 05/01/2021 ?CLINICAL DATA:  Acute respiratory failure.  Follow-up exam. EXAM: PORTABLE CHEST 1 VIEW COMPARISON:  04/30/2021. FINDINGS: Left perihilar and lung base opacity, and mild right lung base opacity, is without significant change from the previous day's exam. No new lung abnormalities. No evidence of pulmonary edema. Possible small effusions.  No pneumothorax. Endotracheal tube,  nasal/orogastric tube and left PICC are stable. IMPRESSION: 1. No significant change from the previous day's exam. 2. Persistent, left greater than right, lung base opacities, extending to the perihilar region on the left, consistent with atelectasis, pneumonia or a combination. Suspect small associated pleural effusions. No pulmonary edema. 3. Stable well-positioned support apparatus. Electronically Signed   By: Amie Portland M.D.   On: 05/01/2021 08:08  ? ?DG Chest Port 1 View ? ?Result Date: 04/30/2021 ?CLINICAL DATA:  Bilateral inguinal hernia. EXAM: PORTABLE CHEST 1 VIEW COMPARISON:  April 11, 2021. FINDINGS: Stable cardiomediastinal silhouette. Endotracheal and nasogastric tubes are unchanged in position. Hypoinflation of the lungs is noted with minimal bibasilar subsegmental atelectasis. Bony thorax is unremarkable. IMPRESSION: Endotracheal and nasogastric tubes are in grossly good position. Hypoinflation of the lungs with minimal bibasilar subsegmental atelectasis. Electronically Signed   By: Lupita Raider M.D.   On: 04/30/2021 16:29   ? ?Anti-infectives: ?Anti-infectives (From admission, onward)  ? ? Start     Dose/Rate Route Frequency Ordered Stop  ? 04/30/21 1730  Ampicillin-Sulbactam (UNASYN) 3 g in sodium chloride 0.9 % 100 mL IVPB       ? 3 g ?200 mL/hr over 30 Minutes Intravenous Every 6 hours 04/30/21 1630    ? ?  ? ? ?Assessment/Plan: ?Chronically incarcerated complex ventral hernia ?- 04/30/21 - Dr. Dossie Der - open primary repair of chronically incarcerated ventral incisional hernia, primary right inguinal hernia repair  without mesh, extensive lysis of adhesions 4 hours ?- Patient remained intubated after surgery due to pressor requirement, fluid shift, and possible aspiration.  ?- CCM weaning towards extubation ?Severe protein calorie malnutrition - prealbumin 6.2 (3/20) from 5.5. continue TPN ?Anasarca ?Anemia of chronic disease and iron deficiency anemia - Hgb 12.3 today ?Deconditioning - PT  eval today.  Fear patient is laying in bed and making himself even weaker prior to major surgical intervention. ?  ?ID - WBC 6.5.  On Unasyn for presumed aspiration ?FEN - TPN, NPO; await return of bowel function ?VTE - lovenox ?Foley - in place ? LOS: 8 days  ? ? ?Wilmon Arms Bebe Moncure ?05/01/2021 ? ?

## 2021-05-01 NOTE — Evaluation (Signed)
Occupational Therapy Evaluation ?Patient Details ?Name: Logan Nelson ?MRN: [id number] ?DOB: [birth year] ?Today's Date: 05/01/2021 ? ? ?History of Present Illness Pt is a 56 y/o male who presents for admission 04/23/21 for total parenteral nurtrition and treatment of malnutrition before hernia repairs (possibly 04/30/2021). PMH significant for anemia, arthritis, dyspnea, GERD, HTN, LE edema, C4-C5 narrowing and R sided weakness, numbness, dysmetria.  Underwent incarcerated periumbilical incisional hernia, epigastric incisional hernia, right inguinal hernia 3/24.  Sent to ICU post surgery intubated.  Now extubated 3/25.  ? ?Clinical Impression ?  ?Patient is now post op surgical repair, and extubated.  He is needing increasing assist compared to his original eval status.  Currently needing up to Max A for lower body ADL, and Mod A for basic transfers.  RN able to transfer patient to recliner, patient with one attempted stand, but declined any further attempts.  HR to 132, and O2 sat not reading at all.  OT will continue efforts in the acute setting, and AIR is recommended for post acute rehab.  Deficits impacting independence are listed below, with abdominal discomfort being the primary deficit.    ?   ? ?Recommendations for follow up therapy are one component of a multi-disciplinary discharge planning process, led by the attending physician.  Recommendations may be updated based on patient status, additional functional criteria and insurance authorization.  ? ?Follow Up Recommendations ? Acute inpatient rehab (3hours/day)  ?  ?Assistance Recommended at Discharge Frequent or constant Supervision/Assistance  ?Patient can return home with the following A lot of help with walking and/or transfers;A lot of help with bathing/dressing/bathroom;Assist for transportation;Assistance with cooking/housework ? ?  ?Functional Status Assessment ? Patient has had a recent decline in their functional status and demonstrates the  ability to make significant improvements in function in a reasonable and predictable amount of time.  ?Equipment Recommendations ? BSC/3in1;Tub/shower bench;Wheelchair cushion (measurements OT);Wheelchair (measurements OT)  ?  ?Recommendations for Other Services Rehab consult ? ? ?  ?Precautions / Restrictions Precautions ?Precautions: Fall ?Precaution Comments: R sided weakness, watch HR ?Restrictions ?Weight Bearing Restrictions: No  ? ?  ? ?Mobility Bed Mobility ?  ?  ?  ?  ?  ?  ?  ?  ?Patient Response: Cooperative ? ?Transfers ?Overall transfer level: Needs assistance ?  ?Transfers: Bed to chair/wheelchair/BSC ?  ?  ?  ?Step pivot transfers: Mod assist ?  ?  ?General transfer comment: RN was able to assist with transfer. ?  ? ?  ?Balance Overall balance assessment: Needs assistance ?Sitting-balance support: Feet supported, No upper extremity supported ?Sitting balance-Leahy Scale: Fair ?  ?  ?Standing balance support: Reliant on assistive device for balance ?Standing balance-Leahy Scale: Poor ?  ?  ?  ?  ?  ?  ?  ?  ?  ?  ?  ?  ?   ? ?ADL either performed or assessed with clinical judgement  ? ?ADL   ?Eating/Feeding: NPO ?  ?Grooming: Set up;Sitting ?  ?Upper Body Bathing: Moderate assistance;Sitting ?  ?Lower Body Bathing: Maximal assistance;Sitting/lateral leans ?  ?Upper Body Dressing : Moderate assistance;Sitting ?  ?Lower Body Dressing: Maximal assistance;Sitting/lateral leans ?  ?  ?  ?  ?  ?  ?  ?  ?   ? ? ? ?Vision Patient Visual Report: No change from baseline ?   ?   ?Perception Perception ?Perception: Within Functional Limits ?  ?Praxis Praxis ?Praxis: Intact ?  ? ?Pertinent Vitals/Pain Pain Assessment ?Pain Assessment: Faces ?Faces  Pain Scale: Hurts worst ?Pain Location: abdomen ?Pain Descriptors / Indicators: Discomfort ?Pain Intervention(s): Patient requesting pain meds-RN notified  ? ? ? ?Hand Dominance Right ?  ?Extremity/Trunk Assessment Upper Extremity Assessment ?Upper Extremity Assessment:  RUE deficits/detail ?RUE Deficits / Details: Decreased strength, active AROM, sensation, coordination. Full ROM passively.  Able to perform shoulder flexion to 60 degrees ?RUE Sensation: decreased light touch ?RUE Coordination: decreased fine motor;decreased gross motor ?LUE Deficits / Details: Weak with difficulty raising his arm to eye level.  Able to perfrom hand to mouth. ?LUE Coordination: decreased gross motor;decreased fine motor ?  ?Lower Extremity Assessment ?Lower Extremity Assessment: Defer to PT evaluation ?  ?Cervical / Trunk Assessment ?Cervical / Trunk Assessment: Kyphotic ?  ?Communication   ?  ?Cognition Arousal/Alertness: Awake/alert ?Behavior During Therapy: Flat affect ?Overall Cognitive Status: No family/caregiver present to determine baseline cognitive functioning ?Area of Impairment: Attention ?  ?  ?  ?  ?  ?  ?  ?  ?  ?Current Attention Level: Alternating ?  ?Following Commands: Follows one step commands consistently ?Safety/Judgement: Decreased awareness of deficits, Decreased awareness of safety ?  ?  ?  ?  ?  ?General Comments   HR to 132 with movement ? ?  ?Exercises   ?  ?Shoulder Instructions    ? ? ?Home Living Family/patient expects to be discharged to:: Private residence ?Living Arrangements: Other relatives ?Available Help at Discharge: Available 24 hours/day;Family ?Type of Home: Apartment ?Home Access: Level entry ?  ?  ?Home Layout: One level ?  ?  ?Bathroom Shower/Tub: Tub/shower unit ?  ?Bathroom Toilet: Standard ?  ?  ?Home Equipment: Cane - single Librarian, academicpoint;Rolling Walker (2 wheels) ?  ?  ?  ? ?  ?Prior Functioning/Environment Prior Level of Function : Needs assist ?  ?  ?  ?  ?  ?  ?Mobility Comments: intermittently uses SPC, stating he uses the cane outside. ?ADLs Comments: Patient stated he is having increased difficulty "getting around", and can't transfer into the tub/shower.  Attempts sink baths when he is able. ?  ? ?  ?  ?OT Problem List: Decreased strength;Decreased  range of motion;Impaired balance (sitting and/or standing);Decreased activity tolerance;Decreased knowledge of use of DME or AE;Decreased safety awareness;Decreased knowledge of precautions;Pain ?  ?   ?OT Treatment/Interventions: Self-care/ADL training;Therapeutic exercise;Balance training;Therapeutic activities;DME and/or AE instruction  ?  ?OT Goals(Current goals can be found in the care plan section) Acute Rehab OT Goals ?Patient Stated Goal: I need to get back to the apartment. ?OT Goal Formulation: With patient ?Time For Goal Achievement: 05/14/21 ?Potential to Achieve Goals: Good ?ADL Goals ?Pt Will Perform Grooming: with supervision;standing ?Pt Will Perform Upper Body Dressing: with supervision;sitting ?Pt Will Perform Lower Body Dressing: with min assist;sit to/from stand;with adaptive equipment ?Pt Will Transfer to Toilet: with min assist;ambulating;regular height toilet ?Pt Will Perform Toileting - Clothing Manipulation and hygiene: with min guard assist;sit to/from stand ?Pt/caregiver will Perform Home Exercise Program: Increased strength;Both right and left upper extremity;With theraband;With Supervision;With written HEP provided  ?OT Frequency: Min 2X/week ?  ? ?Co-evaluation   ?  ?  ?  ?  ? ?  ?AM-PAC OT "6 Clicks" Daily Activity     ?Outcome Measure Help from another person eating meals?: Total ?Help from another person taking care of personal grooming?: A Little ?Help from another person toileting, which includes using toliet, bedpan, or urinal?: A Lot ?Help from another person bathing (including washing, rinsing, drying)?: A Lot ?Help from  another person to put on and taking off regular upper body clothing?: A Lot ?Help from another person to put on and taking off regular lower body clothing?: A Lot ?6 Click Score: 12 ?  ?End of Session Nurse Communication: Precautions ? ?Activity Tolerance: Patient limited by fatigue;Patient limited by pain ?Patient left: in chair;with call bell/phone within  reach ? ?OT Visit Diagnosis: Unsteadiness on feet (R26.81);Other abnormalities of gait and mobility (R26.89);Muscle weakness (generalized) (M62.81);History of falling (Z91.81);Hemiplegia and hemiparesis ?Hemipleg

## 2021-05-01 NOTE — Progress Notes (Signed)
PHARMACY - TOTAL PARENTERAL NUTRITION CONSULT NOTE  ? ?Indication:  partial bowel obstruction ? ?Patient Measurements: ?Height: 5' 6.5" (168.9 cm) ?Weight: 63.8 kg (140 lb 10.5 oz) ?IBW/kg (Calculated) : 64.95 ?TPN AdjBW (KG): 63.8 ?Body mass index is 22.36 kg/m?. ? ?Assessment: 55 years of age male with abdominal wall hernias, GERD, and B12 deficiency status post recent admission for severe protein calorie malnutrition who presents as direct admission from surgical office for chronic partial bowel obstruction with nausea, vomiting, inability to eat and weight loss. Per surgeon note, intake has been some Ensures but other wise very poor oral intake. Patient with 40% weight loss in 6 months. Patient admitted for nutritional optimization before hernia repair surgery, going for surgery 3/24. Pharmacy consulted for TPN.  ? ?Glucose / Insulin:  BG mostly <180, one elevated BG of 241; HgB A1c: 4.0 on 04/12/2021, no insulin currently but may require in post-op period ?Electrolytes:  K up to 4.4, Cl up to 119, Co2 down to 20, Corr Ca 8.8 (slightly low), Phos trend down to 2.6, Mg trend down to 1.8 ?Renal: Scr 0.45, BUN 26 ?Hepatic: AST/ALT 54/59; Tbili wnl; TG 33, Albumin 1.9, prealbumin 6.2 (3/20)  ?Intake / Output; MIVF: UOP incompletely charted, Last BM 3/22 ?GI Imaging:  ?3/22 KUB: Diffuse bowel dilatation likely small bowel. Possible small bowel obstruction.  ?GI Surgeries / Procedures:  ?3/24 exploratory laparotomy with repair of umbilical, epigastric and right inguinal hernia. (7L fluid aspirated and 3 units PRBC required) ? ?3/25 Pt remains intubated since 3/24 post-op. Currently on propofol (80mcg/kg/min = 11.5 ml/hr) which is providing ~300 lipid calories per day ? ?Central access: DL PICC 9/73/5329 ?TPN start date: 04/24/2021 ? ?Nutritional Goals: ?Goal TPN rate is 85 mL/hr (provides 114 g of protein and 2149 kcals per day) ? ?RD Assessment: ?Estimated Needs ?Total Energy Estimated Needs: 2100-2300 ?Total Protein  Estimated Needs: 100-115g ?Total Fluid Estimated Needs: 2.1L/day ? ?Current Nutrition:  ?TPN  ?Propofol (27mcg/kg/min) providing ~300 lipid calories per day ? ?Plan:  ?Continue TPN at goal rate of 85 ml/hr (adjust amount of lipids in TPN to ~1/2 goal rate with propofol on board). TPN + propofol will provide 114 gm protein and 2123 kcal. ?Electrolytes in TPN: Decrease K to 45 mEq/L. Increase Ca to 7 mEq/L, Mg to 10 mEq/L, Phos to 18 mmol/L. Continue Na 100 mEq/L. Change Cl:Ace to 1:2. ?Add standard MVI and trace elements back to TPN as unable to use NGT at this time (back to PO/per tube when able) ?Restart q6h CBG checks with very sensitive SSI coverage -plan to d/c in 1-2 days if not needing ?Monitor TPN labs Mon/Thur and PRN ? ?Christoper Fabian, PharmD, BCPS ?Please see amion for complete clinical pharmacist phone list ?05/01/2021 7:46 AM ? ?

## 2021-05-02 ENCOUNTER — Inpatient Hospital Stay (HOSPITAL_COMMUNITY): Payer: Medicaid Other

## 2021-05-02 DIAGNOSIS — K4010 Bilateral inguinal hernia, with gangrene, not specified as recurrent: Secondary | ICD-10-CM

## 2021-05-02 LAB — CBC
HCT: 31.7 % — ABNORMAL LOW (ref 39.0–52.0)
Hemoglobin: 10.7 g/dL — ABNORMAL LOW (ref 13.0–17.0)
MCH: 31 pg (ref 26.0–34.0)
MCHC: 33.8 g/dL (ref 30.0–36.0)
MCV: 91.9 fL (ref 80.0–100.0)
Platelets: 137 10*3/uL — ABNORMAL LOW (ref 150–400)
RBC: 3.45 MIL/uL — ABNORMAL LOW (ref 4.22–5.81)
RDW: 17.3 % — ABNORMAL HIGH (ref 11.5–15.5)
WBC: 12.2 10*3/uL — ABNORMAL HIGH (ref 4.0–10.5)
nRBC: 0 % (ref 0.0–0.2)

## 2021-05-02 LAB — BASIC METABOLIC PANEL
Anion gap: 4 — ABNORMAL LOW (ref 5–15)
BUN: 27 mg/dL — ABNORMAL HIGH (ref 6–20)
CO2: 23 mmol/L (ref 22–32)
Calcium: 7.9 mg/dL — ABNORMAL LOW (ref 8.9–10.3)
Chloride: 117 mmol/L — ABNORMAL HIGH (ref 98–111)
Creatinine, Ser: 0.6 mg/dL — ABNORMAL LOW (ref 0.61–1.24)
GFR, Estimated: >60 mL/min (ref >60)
Glucose, Bld: 91 mg/dL (ref 70–99)
Potassium: 3.9 mmol/L (ref 3.5–5.1)
Sodium: 144 mmol/L (ref 135–145)

## 2021-05-02 LAB — GLUCOSE, CAPILLARY
Glucose-Capillary: 102 mg/dL — ABNORMAL HIGH (ref 70–99)
Glucose-Capillary: 83 mg/dL (ref 70–99)
Glucose-Capillary: 83 mg/dL (ref 70–99)
Glucose-Capillary: 86 mg/dL (ref 70–99)
Glucose-Capillary: 91 mg/dL (ref 70–99)
Glucose-Capillary: 98 mg/dL (ref 70–99)

## 2021-05-02 IMAGING — DX DG CHEST 1V PORT
1 series · 1 of 1 positions shown · non-contrast
Comparison: [DATE] and older studies.

CLINICAL DATA: Short of breath.

EXAM:
PORTABLE CHEST 1 VIEW

[chest ap]
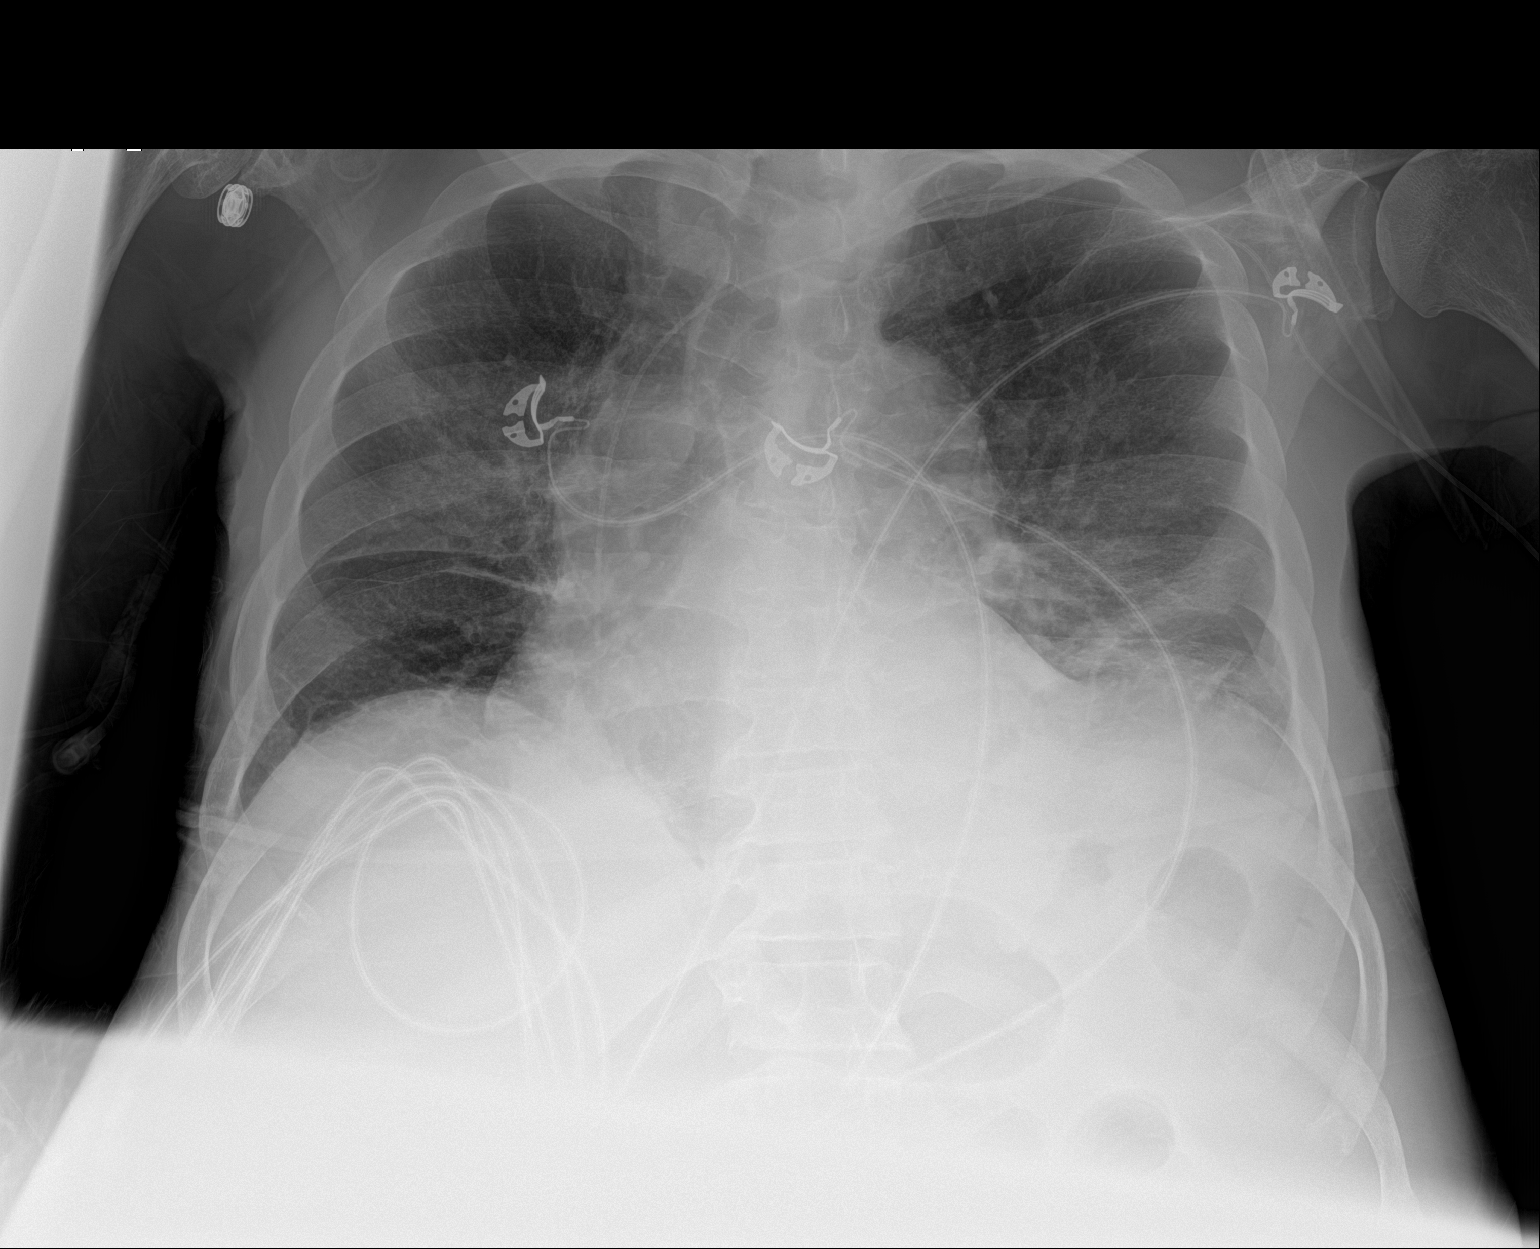

[1 of 1 positions shown; findings below may reference images not displayed]

FINDINGS: Since the previous day's study, the endotracheal tube and
nasal/orogastric tube have been removed. Left PICC is stable.

Hazy opacity now noted in the central right upper lobe. Persistent
opacity noted at the left lung base with milder opacity at the right
lung base.

Possible small left pleural effusion.

No pneumothorax.
IMPRESSION: 1. New area of hazy opacity in the central right upper lobe, which
may reflect atelectasis or infection.
2. Persistent left lower lung opacity and milder right lung base
opacity, consistent with atelectasis. Suspect small left effusion.
3. No convincing pulmonary edema.
4. Status post extubation and removal of the orogastric tube.

## 2021-05-02 MED ORDER — TRAVASOL 10 % IV SOLN
INTRAVENOUS | Status: AC
  Filled 2021-05-02: qty 1144.8

## 2021-05-02 NOTE — Progress Notes (Signed)
PHARMACY - TOTAL PARENTERAL NUTRITION CONSULT NOTE  ? ?Indication: partial bowel obstruction ? ?Patient Measurements: ?Height: 5' 6.5" (168.9 cm) ?Weight: 63.8 kg (140 lb 10.5 oz) ?IBW/kg (Calculated) : 64.95 ?TPN AdjBW (KG): 63.8 ?Body mass index is 22.36 kg/m?. ? ?Assessment: 56 years of age male with abdominal wall hernias, GERD, and B12 deficiency status post recent admission for severe protein calorie malnutrition who presents as direct admission from surgical office for chronic partial bowel obstruction with nausea, vomiting, inability to eat and weight loss. Per surgeon note, intake has been some Ensures but other wise very poor oral intake. Patient with 40% weight loss in 6 months. Patient admitted for nutritional optimization before hernia repair surgery, going for surgery 3/24. Pharmacy consulted for TPN.  ? ?Glucose / Insulin:  A1c 4.0 on 04/12/2021. BG <130,  required no insulin overnight.  ?Electrolytes:  K 3.9, Cl 117, Co2 23, Corr Ca 9.5 ?Renal: Scr <1, BUN 27 ?Hepatic: AST/ALT 54/59; Tbili wnl; TG 33, Albumin 2, prealbumin 6.2 (3/20)  ?Intake / Output; MIVF: UOP incompletely charted, Last BM 3/22. NGT out. ?GI Imaging:  ?3/22 KUB: Diffuse bowel dilatation likely small bowel. Possible small bowel obstruction.  ?GI Surgeries / Procedures:  ?3/24 exploratory laparotomy with repair of umbilical, epigastric and right inguinal hernia. (7L fluid aspirated and 3 units PRBC required) ? ?3/26 Pt extubated 3/25 and NGT pulled out, seems to be tolerating so plans for sips today. ? ?Central access: DL PICC 8/32/5498 ?TPN start date: 04/24/2021 ? ?Nutritional Goals: ?Goal TPN rate adjusted slightly to 90 ml/hr (needed more fluid to be able to fit all components in TPN) (provides 114 g of protein and 2185 kcals per day) ? ?RD Assessment: ?Estimated Needs ?Total Energy Estimated Needs: 2100-2300 ?Total Protein Estimated Needs: 100-115g ?Total Fluid Estimated Needs: 2.1L/day ? ?Current Nutrition:  ?TPN  ? ?Plan:   ?Adjust TPN goal rate slightly to 90 ml/hr so all components can fit (adjust amount of lipids back to goal in TPN since propofol d/c). TPN  will provide 114 gm protein and 2185 kcal. ?Electrolytes in TPN: Continue Na 100 mEq/L, K 45 mEq/L, Ca 7 mEq/L, Mg 10 mEq/L, Phos 18 mmol/L. Continue Na 100 mEq/L.  Cl:Ace 1:2. ?Standard MVI and trace elements back in TPN (back to PO/per tube when able) ?Continue q6h CBG checks with very sensitive SSI coverage -plan to d/c Monday if not requiring any insulin ?Monitor TPN labs Mon/Thur and PRN ? ?Christoper Fabian, PharmD, BCPS ?Please see amion for complete clinical pharmacist phone list ?05/02/2021 8:37 AM ? ?

## 2021-05-02 NOTE — Progress Notes (Signed)
? ?NAME:  Logan Nelson, MRN:  [id number], DOB:  07-03-1965, LOS: 9 ?ADMISSION DATE:  04/23/2021, CONSULTATION DATE:  05/02/2021 ? ?REFERRING MD:  CCS, stechschulte, CHIEF COMPLAINT:  Post op vent management  ? ?History of Present Illness:  ?56 year old man with chronically incarcerated ventral and inguinal hernias and partial obstruction.  He had severe protein calorie malnutrition with weight loss with a recent admission to internal medicine service.  He could not tolerate oral intake as outpatient.  He was admitted electively on 3/17 started on TNA via a left upper extremity PICC line.  He underwent exploratory laparotomy with repair of umbilical, epigastric and right inguinal hernia, lysis of additions (required 4 hours), 7 L of fluid was aspirated, he received 3 units of PRBC, required pressors.  There was concern for aspiration during the procedure and he was left intubated and transferred to the ICU.  There was no enterotomy during the procedure ? ?Pertinent  Medical History  ?Severe protein calorie malnutrition ?Hypertension ? ?Significant Hospital Events: ?Including procedures, antibiotic start and stop dates in addition to other pertinent events   ?3/17 Admit for TPN in setting of malnutrition due to incarcerated hernias/partial obstruction  ?3/24 lysis of adhesions, right inguinal hernia repair with mesh, open incarcerated incisional hernia repair. Returned to ICU on propofol, neo-synephrine  ?3/25 Extubated  ?3/26 Ok for clears from floor per CCS  ? ?Interim History / Subjective:  ?Tolerated extubation  ?Pt reports feeling hungry, asking for food  ?Afebrile ?Glucose range 83-98 ?3L [place]  ? ?Objective   ?Blood pressure (!) 127/98, pulse 99, temperature 98.8 ?F (37.1 ?C), temperature source Oral, resp. rate 17, height 5' 6.5" (1.689 m), weight 63.8 kg, SpO2 95 %. ?   ?Vent Mode: PSV;CPAP ?FiO2 (%):  [40 %] 40 % ?Pressure Support:  [5 cmH20] 5 cmH20  ? ?Intake/Output Summary (Last 24 hours) at 05/02/2021  0920 ?Last data filed at 05/02/2021 0601 ?Gross per 24 hour  ?Intake 3507.12 ml  ?Output 1475 ml  ?Net 2032.12 ml  ? ?Filed Weights  ? 04/23/21 1212 04/30/21 0815  ?Weight: 63.8 kg 63.8 kg  ? ? ?Examination: ?General: cachectic adult male sitting up in bed in NAD  ?HEENT: MM pink/moist, poor dentition/loose teeth ?Neuro: AAOx4, speech clear, MAE / generalized weakness  ?CV: s1s2 RRR, no m/r/g ?PULM: non-labored at rest, lungs bilaterally clear with good air entry  ?GI: soft, bsx4 hypoactive, midline abd incision with waffle dressing intact, small amt bloody drainage, penrose drain at lower portion of dressing  ?Extremities: warm/dry, trace dependent edema  ?Skin: no rashes or lesions ? ?Resolved Hospital Problem list   ?Post operative Hypotension  ?Acute Metabolic Encephalopathy  ? ?Assessment & Plan:  ? ?Acute Respiratory Failure with Hypoxia ?Concern for aspiration pneumonia ?Smoker, possible COPD ?-pulmonary hygiene - IS, mobilize ?-wean O2 for sats >90% ?-PRN albuterol  ?-empiric unasyn for possible aspiration event intraop, D3/5. Stop date added.  ?-follow tracheal aspirate  ? ?Post Operative Open Incarcerated Incisional Hernia Repari  ?Prolonged OR time with lysis of adhesions, multiple hernia repairs  ?-post op care & pain control per CCS  ?-NPO x clears from floor per CCS ? ?Acute on chronic blood loss anemia  ?-trend CBC  ?-transfuse for Hgb <7% ? ?Severe protein calorie malnutrition (POA) ?-continue TPN per pharmacy for nutrition  ?-defer advancement of diet to CCS ? ?Hyperglycemia ?-glucose goal 140-180 ?-SSI, very sensitive scale  ?-monitor trend  ? ?Best Practice (right click and "Reselect all SmartList Selections" daily)  ?  Diet/type: TPN ?DVT prophylaxis: LMWH ?GI prophylaxis: PPI ?Lines: Central line > PICC Line, continue ?Foley:  N/A ?Code Status:  full code ?Last date of multidisciplinary goals of care discussion: no HCPOA identified, he has an underage daughter, separated from his daughter's mother  4 years but she/her family has taken care of him during his illness.   ? ?Patient updated on plan of care 3/26 ? ?Critical care time: n/a ?  ? ?Canary Brim, MSN, APRN, NP-C, AGACNP-BC ?[facility] ?05/02/2021, 9:20 AM ? ? ?Please see [email or web address] for pager details.  ? ?From 7A-7P if no response, please call [phone] ?After hours, please call Pola Corn [phone] ? ? ? ? ? ?

## 2021-05-02 NOTE — Progress Notes (Signed)
Dr Freida Busman contacted about pt NG tube removal. MD advised to leave the NG tube out at this time. RN to continue to monitor.  ?

## 2021-05-02 NOTE — Progress Notes (Signed)
Patient ID: Logan Nelson, male   DOB: 04-Oct-1965, 56 y.o.   MRN: [id number] ?2 Days Post-Op  ?  ?Subjective: ?NGT out last night and could not be replaced. He reports no nausea and wants to drink clears. ?ROS negative except as listed above. ?Objective: ?Vital signs in last 24 hours: ?Temp:  [97.8 ?F (36.6 ?C)-98.8 ?F (37.1 ?C)] 98.8 ?F (37.1 ?C) (03/26 0800) ?Pulse Rate:  [72-129] 99 (03/26 0800) ?Resp:  [11-29] 17 (03/26 0800) ?BP: (104-127)/(74-98) 127/98 (03/26 0800) ?SpO2:  [78 %-98 %] 95 % (03/26 0800) ?Arterial Line BP: (69-145)/(56-136) 69/59 (03/26 0100) ?FiO2 (%):  [40 %] 40 % (03/25 1100) ?Last BM Date : 04/28/21 ? ?Intake/Output from previous day: ?03/25 0701 - 03/26 0700 ?In: 3905.5 [I.V.:2879.9; IV Piggyback:1025.6] ?Out: 1475 [Urine:1175; Emesis/NG output:300] ?Intake/Output this shift: ?No intake/output data recorded. ? ?General appearance: alert ?Resp: clear to auscultation bilaterally ?Cardio: regular rate and rhythm ?GI: distended but soft, dressings OK ?Extremities: no edema ? ?Lab Results: ?CBC  ?Recent Labs  ?  05/01/21 ?0640 05/02/21 ?0500  ?WBC 6.5 12.2*  ?HGB 12.3* 10.7*  ?HCT 35.7* 31.7*  ?PLT 184 137*  ? ?BMET ?Recent Labs  ?  05/01/21 ?0640 05/02/21 ?0500  ?NA 141 144  ?K 4.4 3.9  ?CL 119* 117*  ?CO2 20* 23  ?GLUCOSE 149* 91  ?BUN 26* 27*  ?CREATININE 0.45* 0.60*  ?CALCIUM 7.2* 7.9*  ? ?PT/INR ?No results for input(s): LABPROT, INR in the last 72 hours. ?ABG ?Recent Labs  ?  04/30/21 ?1418 04/30/21 ?1641  ?PHART 7.358 7.316*  ?HCO3 23.7 23.1  ? ? ?Studies/Results: ?DG Chest Port 1 View ? ?Result Date: 05/01/2021 ?CLINICAL DATA:  Acute respiratory failure.  Follow-up exam. EXAM: PORTABLE CHEST 1 VIEW COMPARISON:  04/30/2021. FINDINGS: Left perihilar and lung base opacity, and mild right lung base opacity, is without significant change from the previous day's exam. No new lung abnormalities. No evidence of pulmonary edema. Possible small effusions.  No pneumothorax. Endotracheal tube,  nasal/orogastric tube and left PICC are stable. IMPRESSION: 1. No significant change from the previous day's exam. 2. Persistent, left greater than right, lung base opacities, extending to the perihilar region on the left, consistent with atelectasis, pneumonia or a combination. Suspect small associated pleural effusions. No pulmonary edema. 3. Stable well-positioned support apparatus. Electronically Signed   By: Amie Portland M.D.   On: 05/01/2021 08:08  ? ?DG Chest Port 1 View ? ?Result Date: 04/30/2021 ?CLINICAL DATA:  Bilateral inguinal hernia. EXAM: PORTABLE CHEST 1 VIEW COMPARISON:  April 11, 2021. FINDINGS: Stable cardiomediastinal silhouette. Endotracheal and nasogastric tubes are unchanged in position. Hypoinflation of the lungs is noted with minimal bibasilar subsegmental atelectasis. Bony thorax is unremarkable. IMPRESSION: Endotracheal and nasogastric tubes are in grossly good position. Hypoinflation of the lungs with minimal bibasilar subsegmental atelectasis. Electronically Signed   By: Lupita Raider M.D.   On: 04/30/2021 16:29   ? ?Anti-infectives: ?Anti-infectives (From admission, onward)  ? ? Start     Dose/Rate Route Frequency Ordered Stop  ? 04/30/21 1730  Ampicillin-Sulbactam (UNASYN) 3 g in sodium chloride 0.9 % 100 mL IVPB       ? 3 g ?200 mL/hr over 30 Minutes Intravenous Every 6 hours 04/30/21 1630    ? ?  ? ? ?Assessment/Plan: ?s/p Procedure(s): ?OPEN UMBILICAL AND EPIGASTRIC HERNIA REPAIR ?HERNIA REPAIR INGUINAL WITH MESH ?LYSIS OF ADHESION ?Chronically incarcerated complex ventral hernia ?- 04/30/21 - Dr. Dossie Der - open primary repair of chronically incarcerated ventral  incisional hernia, primary right inguinal hernia repair without mesh, extensive lysis of adhesions 4 hours ?- Patient remained intubated after surgery but tolerated extubation ?Severe protein calorie malnutrition - prealbumin 6.2 (3/20) from 5.5. continue TPN ?Anasarca ?Anemia of chronic disease and iron deficiency  anemia - Hgb 10.7 today ?Deconditioning - PT/OT ?  ?ID - WBC 12.2.  On Unasyn for presumed aspiration ?FEN - TPN, allow sips from the floor ?VTE - lovenox ?Foley - in place ?Dispo - to 4NP ? LOS: 9 days  ? ? ?Violeta Gelinas, MD, MPH, FACS ?Trauma & General Surgery ?Use [email or web address] to contact on call provider ? ?05/02/2021  ?

## 2021-05-02 NOTE — Progress Notes (Signed)
NG tube found on patient's chest at 0015am. This RN attempted two passes in both nares w/o success. Elink RN Missy notified. Awaiting MD return call. Pt stable and resting. RN to continue to monitor. ?

## 2021-05-02 NOTE — Progress Notes (Signed)
Report given to Ashley, RN via phone.

## 2021-05-02 NOTE — Progress Notes (Signed)
IP rehab admissions - patient screened for potential acute inpatient rehab admission.  Appears to be a candidate per therapy recommendations.  I will place an order for rehab consult for a full assessment.  Call for questions.  [phone] ?

## 2021-05-02 NOTE — Progress Notes (Signed)
Pt admitted to 4NP-08 with 1 bag of belongings. A&O x 4 c/o pain 10/10 to back. Assessment and VS documented. Pt oriented to unit. Call bell within reach.  ?

## 2021-05-03 ENCOUNTER — Encounter (HOSPITAL_COMMUNITY): Payer: Self-pay | Admitting: Surgery

## 2021-05-03 LAB — COMPREHENSIVE METABOLIC PANEL
ALT: 93 U/L — ABNORMAL HIGH (ref 0–44)
AST: 112 U/L — ABNORMAL HIGH (ref 15–41)
Albumin: <1.5 g/dL — ABNORMAL LOW (ref 3.5–5.0)
Alkaline Phosphatase: 194 U/L — ABNORMAL HIGH (ref 38–126)
Anion gap: 3 — ABNORMAL LOW (ref 5–15)
BUN: 28 mg/dL — ABNORMAL HIGH (ref 6–20)
CO2: 23 mmol/L (ref 22–32)
Calcium: 8 mg/dL — ABNORMAL LOW (ref 8.9–10.3)
Chloride: 119 mmol/L — ABNORMAL HIGH (ref 98–111)
Creatinine, Ser: 0.6 mg/dL — ABNORMAL LOW (ref 0.61–1.24)
GFR, Estimated: >60 mL/min (ref >60)
Glucose, Bld: 97 mg/dL (ref 70–99)
Potassium: 3.3 mmol/L — ABNORMAL LOW (ref 3.5–5.1)
Sodium: 145 mmol/L (ref 135–145)
Total Bilirubin: 0.8 mg/dL (ref 0.3–1.2)
Total Protein: 4.3 g/dL — ABNORMAL LOW (ref 6.5–8.1)

## 2021-05-03 LAB — CBC
HCT: 28.4 % — ABNORMAL LOW (ref 39.0–52.0)
Hemoglobin: 9.6 g/dL — ABNORMAL LOW (ref 13.0–17.0)
MCH: 30.9 pg (ref 26.0–34.0)
MCHC: 33.8 g/dL (ref 30.0–36.0)
MCV: 91.3 fL (ref 80.0–100.0)
Platelets: 137 10*3/uL — ABNORMAL LOW (ref 150–400)
RBC: 3.11 MIL/uL — ABNORMAL LOW (ref 4.22–5.81)
RDW: 17 % — ABNORMAL HIGH (ref 11.5–15.5)
WBC: 10.9 10*3/uL — ABNORMAL HIGH (ref 4.0–10.5)
nRBC: 0 % (ref 0.0–0.2)

## 2021-05-03 LAB — MAGNESIUM: Magnesium: 2 mg/dL (ref 1.7–2.4)

## 2021-05-03 LAB — GLUCOSE, CAPILLARY
Glucose-Capillary: 90 mg/dL (ref 70–99)
Glucose-Capillary: 80 mg/dL (ref 70–99)
Glucose-Capillary: 131 mg/dL — ABNORMAL HIGH (ref 70–99)
Glucose-Capillary: 89 mg/dL (ref 70–99)
Glucose-Capillary: 109 mg/dL — ABNORMAL HIGH (ref 70–99)

## 2021-05-03 LAB — TRIGLYCERIDES: Triglycerides: 41 mg/dL (ref <150)

## 2021-05-03 LAB — PHOSPHORUS: Phosphorus: 3.1 mg/dL (ref 2.5–4.6)

## 2021-05-03 MED ORDER — ZINC SULFATE 220 (50 ZN) MG PO CAPS
220.0000 mg | ORAL_CAPSULE | Freq: Two times a day (BID) | ORAL | Status: DC
  Administered 2021-05-03 – 2021-05-05 (×5): 220 mg via ORAL
  Filled 2021-05-03 (×5): qty 1

## 2021-05-03 MED ORDER — VITAMIN A 3 MG (10000 UNIT) PO CAPS
10000.0000 [IU] | ORAL_CAPSULE | Freq: Every day | ORAL | Status: DC
  Administered 2021-05-03 – 2021-05-05 (×3): 10000 [IU] via ORAL
  Filled 2021-05-03 (×4): qty 1

## 2021-05-03 MED ORDER — VITAMIN D 25 MCG (1000 UNIT) PO TABS
1000.0000 [IU] | ORAL_TABLET | Freq: Every day | ORAL | Status: DC
  Administered 2021-05-03 – 2021-05-05 (×3): 1000 [IU] via ORAL
  Filled 2021-05-03 (×4): qty 1

## 2021-05-03 MED ORDER — TRAVASOL 10 % IV SOLN
INTRAVENOUS | Status: DC
  Filled 2021-05-03: qty 1144.8

## 2021-05-03 MED ORDER — BOOST / RESOURCE BREEZE PO LIQD CUSTOM
1.0000 | Freq: Three times a day (TID) | ORAL | Status: DC
  Administered 2021-05-03: 237 mL via ORAL
  Administered 2021-05-03 – 2021-05-04 (×3): 1 via ORAL
  Filled 2021-05-03: qty 1

## 2021-05-03 MED ORDER — METOPROLOL TARTRATE 5 MG/5ML IV SOLN
10.0000 mg | Freq: Four times a day (QID) | INTRAVENOUS | Status: DC | PRN
  Administered 2021-05-03: 10 mg via INTRAVENOUS
  Filled 2021-05-03: qty 10

## 2021-05-03 MED ORDER — ADULT MULTIVITAMIN W/MINERALS CH
1.0000 | ORAL_TABLET | Freq: Every day | ORAL | Status: DC
  Administered 2021-05-03 – 2021-05-05 (×3): 1 via ORAL
  Filled 2021-05-03 (×3): qty 1

## 2021-05-03 NOTE — Progress Notes (Signed)
Physical Therapy Treatment ?Patient Details ?Name: Logan Nelson ?MRN: [id number] ?DOB: 04/25/1965 ?Today's Date: 05/03/2021 ? ? ?History of Present Illness Pt is a 56 y/o male who presents for admission 04/23/21 for total parenteral nurtrition and treatment of malnutrition before hernia repairs. Hernia repair on 04/30/2021. PMH significant for anemia, arthritis, dyspnea, GERD, HTN, LE edema, C4-C5 narrowing and R sided weakness, numbness, dysmetria.  Underwent incarcerated periumbilical incisional hernia, epigastric incisional hernia, right inguinal hernia 3/24.  Sent to ICU post surgery intubated.  Now extubated 3/25. ? ?  ?PT Comments  ? ? Pt received in chair. Pt mobility limited most today by difficulty standing from chair due to weakness. Mod A needed with each sit>stand for power up. Pt ambulated 150' with RW and min guard A. Chair brought behind pt but he was able to take standing rest breaks x3 instead of seated. Cadence slowed notably by end of 150' and pt was having difficulty advancing feet and DOE 2/4. Pt returned to chair for lunch and family present after session. Chair alarm on. PT will continue to follow. ?   ?Recommendations for follow up therapy are one component of a multi-disciplinary discharge planning process, led by the attending physician.  Recommendations may be updated based on patient status, additional functional criteria and insurance authorization. ? ?Follow Up Recommendations ? Acute inpatient rehab (3hours/day) ?  ?  ?Assistance Recommended at Discharge Intermittent Supervision/Assistance  ?Patient can return home with the following A lot of help with walking and/or transfers;A lot of help with bathing/dressing/bathroom;Assistance with cooking/housework;Assist for transportation;Help with stairs or ramp for entrance;Direct supervision/assist for medications management ?  ?Equipment Recommendations ? Rolling walker (2 wheels)  ?  ?Recommendations for Other Services Rehab  consult ? ? ?  ?Precautions / Restrictions Precautions ?Precautions: Fall ?Precaution Comments: R sided weakness, watcch HR ?Restrictions ?Weight Bearing Restrictions: No  ?  ? ?Mobility ? Bed Mobility ?  ?  ?  ?  ?  ?  ?  ?General bed mobility comments: pt received in recliner ?  ? ?Transfers ?Overall transfer level: Needs assistance ?Equipment used: Rolling walker (2 wheels) ?Transfers: Sit to/from Stand ?Sit to Stand: Mod assist ?  ?  ?  ?  ?  ?General transfer comment: needed mod A for sit>stand for power up from recliner. Practiced 3x ?  ? ?Ambulation/Gait ?Ambulation/Gait assistance: Min guard, +2 safety/equipment ?Gait Distance (Feet): 150 Feet ?Assistive device: Rolling walker (2 wheels) ?Gait Pattern/deviations: Step-through pattern, Decreased stride length, Trunk flexed ?Gait velocity: Decreased ?Gait velocity interpretation: <1.8 ft/sec, indicate of risk for recurrent falls ?  ?General Gait Details: Slow, with effortful advancement of LE's as pt fatigued. No overt LOB noted however heavy use of RW required for support.. Chair brought behind. Needed 3 standing rest breaks ? ? ?Stairs ?  ?  ?  ?  ?  ? ? ?Wheelchair Mobility ?  ? ?Modified Rankin (Stroke Patients Only) ?  ? ? ?  ?Balance Overall balance assessment: Needs assistance ?Sitting-balance support: Single extremity supported, Bilateral upper extremity supported, Feet supported ?Sitting balance-Leahy Scale: Poor ?Sitting balance - Comments: has diffciulty maintaining upright sitting front edge of chair ?Postural control: Posterior lean ?Standing balance support: Single extremity supported ?Standing balance-Leahy Scale: Poor ?Standing balance comment: minA with unilateral UE support. Maintained static stance 4 mins 2x ?  ?  ?  ?  ?  ?  ?  ?  ?  ?  ?Dynamic Gait Index ?Steps: Moderate Impairment ?  ? ?  ?Cognition  Arousal/Alertness: Awake/alert ?Behavior During Therapy: [facility] for tasks assessed/performed, Agitated ?Overall Cognitive Status:  Impaired/Different from baseline ?Area of Impairment: Attention ?  ?  ?  ?  ?  ?  ?  ?  ?  ?Current Attention Level: Alternating ?  ?Following Commands: Follows one step commands consistently, Follows multi-step commands inconsistently ?  ?Awareness: Emergent ?Problem Solving: Slow processing, Decreased initiation, Requires verbal cues ?General Comments: pt not agitated during session with PT, was ealier with OT, occasionally needed redirection to task ?  ?  ? ?  ?Exercises   ? ?  ?General Comments General comments (skin integrity, edema, etc.): VSS on RA ?  ?  ? ?Pertinent Vitals/Pain Pain Assessment ?Pain Assessment: No/denies pain  ? ? ?Home Living   ?  ?  ?  ?  ?  ?  ?  ?  ?  ?   ?  ?Prior Function    ?  ?  ?   ? ?PT Goals (current goals can now be found in the care plan section) Acute Rehab PT Goals ?Patient Stated Goal: return to independence ?PT Goal Formulation: With patient ?Time For Goal Achievement: 05/15/21 ?Potential to Achieve Goals: Good ?Progress towards PT goals: Progressing toward goals ? ?  ?Frequency ? ? ? Min 3X/week ? ? ? ?  ?PT Plan Current plan remains appropriate  ? ? ?Co-evaluation   ?  ?  ?  ?  ? ?  ?AM-PAC PT "6 Clicks" Mobility   ?Outcome Measure ? Help needed turning from your back to your side while in a flat bed without using bedrails?: A Lot ?Help needed moving from lying on your back to sitting on the side of a flat bed without using bedrails?: A Lot ?Help needed moving to and from a bed to a chair (including a wheelchair)?: A Little ?Help needed standing up from a chair using your arms (e.g., wheelchair or bedside chair)?: A Little ?Help needed to walk in hospital room?: A Little ?Help needed climbing 3-5 steps with a railing? : Total ?6 Click Score: 14 ? ?  ?End of Session Equipment Utilized During Treatment: Gait belt ?Activity Tolerance: Patient limited by pain;Patient limited by fatigue ?Patient left: in chair;with call bell/phone within reach ?Nurse Communication: Mobility  status ?PT Visit Diagnosis: Other abnormalities of gait and mobility (R26.89);Muscle weakness (generalized) (M62.81);History of falling (Z91.81) ?  ? ? ?Time: 6384-6659 ?PT Time Calculation (min) (ACUTE ONLY): 31 min ? ?Charges:  $Gait Training: 8-22 mins ?$Therapeutic Activity: 8-22 mins          ?          ? ?Lyanne Co, PT  ?Acute Rehab Services ? Pager [phone] ?Office [phone] ? ? ? ?R[place]ale Nies L Gentle Hoge ?05/03/2021, 2:32 PM ? ?

## 2021-05-03 NOTE — Progress Notes (Signed)
Nutrition Follow-up ? ?DOCUMENTATION CODES:  ? ?Severe malnutrition in context of chronic illness ? ?INTERVENTION:  ? ?- Continue TPN per Pharmacy ? ?- Boost Breeze po TID, each supplement provides 250 kcal and 9 grams of protein ? ?- Encourage PO intake ? ?- MVI with minerals daily ? ?- Cholecalciferol 1000 units daily given vitamin D deficiency ? ?- Vitamin A 10,000 units daily x 2 months given vitamin A deficiency; recommend recheck of vitamin A in 3 months ? ?- Zinc sulfate 220 mg BID x 14 days given zinc deficiency ? ?NUTRITION DIAGNOSIS:  ? ?Severe Malnutrition related to chronic illness (incarcerated ventral and inguinal hernias causing obstruction) as evidenced by severe muscle depletion, severe fat depletion, percent weight loss (30.7% weight loss in less than 7 months). ? ?New diagnosis after completion of NFPE ? ?GOAL:  ? ?Patient will meet greater than or equal to 90% of their needs ? ?Met via TPN ? ?MONITOR:  ? ?PO intake, Supplement acceptance, Labs, Weight trends, I & O's ? ?REASON FOR ASSESSMENT:  ? ?Consult ?New TPN/TNA, Calorie Count ? ?ASSESSMENT:  ? ?55-year-old male who is here for hernias. Pt has had chronically incarcerated ventral and inguinal hernias which are partially obstructed. Pt has had significant weight loss over the last year consistent with malnutrition related to partial obstruction of these hernias. ? ?03/18 - PICC placed, TPN initiated ?03/24 - s/p open incarcerated incisional hernia repair, primary R inguinal hernia repair with mesh, lysis of adhesions for 4 hours, pt left intubated post-op due to concern for aspiration ?03/25 - extubated ?03/26 - NG tube found to be removed, sips of clears from floor ?03/27 - diet advanced to dysphagia 3 ? ?TPN currently infusing at goal rate of 90 ml/hr which provides 2185 kcal and 114 grams of protein. Diet advanced this morning to dysphagia 3. ? ?Spoke with pt at bedside. Pt eager for meal tray to come. RD provided pt with a wild berry  Boost Breeze at time of visit which he enjoyed. ? ?Pt with multiple vitamin and mineral deficiencies. Per MD, okay to order supplementation. Discussed with TPN Pharmacist. ? ?RD able to complete NFPE. Pt meets criteria for severe malnutrition. Pt with a weight loss of 28.2 kg since 10/12/20. This is a 30.7% weight loss in less than 7 months which is severe and significant for timeframe. ? ?Pt with mild pitting generalized edema and moderate pitting edema to RUE and BLE. ? ?Medications reviewed and include: SSI q 6 hours, IV abx, TPN ? ?Vitamin/Mineral Profile: ?Vitamin A: 10.8 (L) ?Vitamin D: 8.19 (L) ?Vitamin E (alpha tocopherol): 6.4 (L) ?Vitamin E (gamma tocopherol): 2.1 (WNL) ?Vitamin K: 0.18 (WNL) ?Copper: 74 (WNL) ?Zinc: 43 (L) ?CRP: 10.9 (H) ? ?Labs reviewed: potassium 3.3, BUN 28, creatinine 0.60, elevated LFTs, WBC 10.9, hemoglobin 9.6, platelets 137 ?CBG's: 80-102 x 24 hours ? ?UOP: 200 ml x 12 hours ?I/O's: +5.5 L since admit ? ?NUTRITION - FOCUSED PHYSICAL EXAM: ? ?Flowsheet Row Most Recent Value  ?Orbital Region Severe depletion  ?Upper Arm Region Severe depletion  ?Thoracic and Lumbar Region Severe depletion  ?Buccal Region Severe depletion  ?Temple Region Severe depletion  ?Clavicle Bone Region Severe depletion  ?Clavicle and Acromion Bone Region Severe depletion  ?Scapular Bone Region Severe depletion  ?Dorsal Hand Moderate depletion  ?Patellar Region Severe depletion  ?Anterior Thigh Region Severe depletion  ?Posterior Calf Region Severe depletion  ?Edema (RD Assessment) Moderate  ?Hair Reviewed  ?Eyes Reviewed  ?Mouth Reviewed  ?Skin Reviewed  ?  Nails Reviewed  ? ?  ? ? ?Diet Order:   ?Diet Order   ? ?       ?  DIET DYS 3 Room service appropriate? Yes with Assist; Fluid consistency: Thin  Diet effective now       ?  ? ?  ?  ? ?  ? ? ?EDUCATION NEEDS:  ? ?Education needs have been addressed ? ?Skin:  Skin Assessment: ?Skin Integrity Issues: ?Incisions: closed abdomen ? ?Last BM:  05/03/21 large type 6 x  2 ? ?Height:  ? ?Ht Readings from Last 1 Encounters:  ?04/30/21 5' 6.5" (1.689 m)  ? ? ?Weight:  ? ?Wt Readings from Last 1 Encounters:  ?04/30/21 63.8 kg  ? ? ?BMI:  Body mass index is 22.36 kg/m?. ? ?Estimated Nutritional Needs:  ? ?Kcal:  2100-2300 ? ?Protein:  100-115g ? ?Fluid:  2.1L/day ? ? ? ?Kate Holmes, MS, RD, LDN ?Inpatient Clinical Dietitian ?Please see AMiON for contact information. ? ?

## 2021-05-03 NOTE — Progress Notes (Signed)
Pt up in chair and states he had a BM and soiled his brief.  Offered to get pt cleaned up but pt refused and states he does not want to be cleaned up until after he eats his lunch.   ?

## 2021-05-03 NOTE — Progress Notes (Signed)
PHARMACY - TOTAL PARENTERAL NUTRITION CONSULT NOTE  ? ?Indication: partial bowel obstruction ? ?Patient Measurements: ?Height: 5' 6.5" (168.9 cm) ?Weight: 63.8 kg (140 lb 10.5 oz) ?IBW/kg (Calculated) : 64.95 ?TPN AdjBW (KG): 63.8 ?Body mass index is 22.36 kg/m?. ? ?Assessment: 56 years of age male with abdominal wall hernias, GERD, and B12 deficiency status post recent admission for severe protein calorie malnutrition who presents as direct admission from surgical office for chronic partial bowel obstruction with nausea, vomiting, inability to eat and weight loss. Per surgeon note, intake has been some Ensures but other wise very poor oral intake. Patient with 40% weight loss in 6 months. Patient admitted for nutritional optimization before hernia repair surgery, going for surgery 3/24. Pharmacy consulted for TPN.  ? ?Glucose / Insulin:  cBG < 110, A1c 4.0 on 04/12/2021. no insulin required ?Electrolytes:  K 3.3, Cl 119, Corr Ca 9.9, others WNL ?Renal: Scr <1, BUN 28 ?Hepatic: AST/ALT increasing to 112/93; Tbili wnl; TG 41, Albumin <1.5, prealbumin 6.2 (3/20)  ?Intake / Output; MIVF: UOP not charted, Last BM 3/26. NGT out. ?GI Imaging:  ?3/22 KUB: Diffuse bowel dilatation likely small bowel. Possible small bowel obstruction.  ?GI Surgeries / Procedures:  ?3/24 exploratory laparotomy with repair of umbilical, epigastric and right inguinal hernia. (7L fluid aspirated and 3 units PRBC required) ? ?3/27 Diet advanced to dysphagia 3 with thin liquids, switching to PO vitamins per dietician ? ?Central access: DL PICC 3/00/7622 ?TPN start date: 04/24/2021 ? ?Nutritional Goals: ?Goal TPN rate adjusted slightly to 90 ml/hr (needed more fluid to be able to fit all components in TPN) (provides 114 g of protein and 2185 kcals per day) ? ?RD Assessment: ?Estimated Needs ?Total Energy Estimated Needs: 2100-2300 ?Total Protein Estimated Needs: 100-115g ?Total Fluid Estimated Needs: 2.1L/day ? ?Current Nutrition:  ?TPN at goal rate  78ml/hr providing 114g protein and 2185 kcal. ? ?Plan:  ?Continue TPN at goal rate of 90 ml/hr ?Electrolytes in TPN: Continue Ca 7 mEq/L, Mg 10 mEq/L, Phos 18 mmol/L. Change: Na to 80 mEq/L and K to 55 mEq/L and max acetate. ?Remove MVI and trace elements in TPN - now ordered PO per dietician ?Discontinue q6h CBG checks with very sensitive SSI coverage ?Monitor TPN labs Mon/Thur and PRN ? ?Wilburn Cornelia, PharmD, BCPS ?Clinical Pharmacist ?05/03/2021 11:12 AM  ? ?Please refer to [facility] for pharmacy phone number ? ?

## 2021-05-03 NOTE — Progress Notes (Signed)
Patient ID: Logan Nelson, male   DOB: January 19, 1966, 56 y.o.   MRN: [id number] ?3 Days Post-Op  ?  ?Subjective: ?Reports had multiple BMs, wants to eat ?ROS negative except as listed above. ?Objective: ?Vital signs in last 24 hours: ?Temp:  [98 ?F (36.7 ?C)-99.2 ?F (37.3 ?C)] 98.7 ?F (37.1 ?C) (03/27 0805) ?Pulse Rate:  [92-114] 92 (03/27 0805) ?Resp:  [14-25] 23 (03/27 0805) ?BP: (119-147)/(83-106) 119/83 (03/27 0805) ?SpO2:  [92 %-96 %] 96 % (03/27 0805) ?FiO2 (%):  [21 %] 21 % (03/26 1600) ?Last BM Date : 05/02/21 ? ?Intake/Output from previous day: ?03/26 0701 - 03/27 0700 ?In: 1624 [I.V.:1224; IV Piggyback:399.9] ?Out: 200 [Urine:200] ?Intake/Output this shift: ?No intake/output data recorded. ? ?General appearance: alert and cooperative ?Resp: clear to auscultation bilaterally ?Cardio: regular rate and rhythm ?GI: soft, dressings intact ? ?Lab Results: ?CBC  ?Recent Labs  ?  05/02/21 ?0500 05/03/21 ?0435  ?WBC 12.2* 10.9*  ?HGB 10.7* 9.6*  ?HCT 31.7* 28.4*  ?PLT 137* 137*  ? ?BMET ?Recent Labs  ?  05/02/21 ?0500 05/03/21 ?0435  ?NA 144 145  ?K 3.9 3.3*  ?CL 117* 119*  ?CO2 23 23  ?GLUCOSE 91 97  ?BUN 27* 28*  ?CREATININE 0.60* 0.60*  ?CALCIUM 7.9* 8.0*  ? ?PT/INR ?No results for input(s): LABPROT, INR in the last 72 hours. ?ABG ?Recent Labs  ?  04/30/21 ?1418 04/30/21 ?1641  ?PHART 7.358 7.316*  ?HCO3 23.7 23.1  ? ? ?Studies/Results: ?DG CHEST PORT 1 VIEW ? ?Result Date: 05/02/2021 ?CLINICAL DATA:  Short of breath. EXAM: PORTABLE CHEST 1 VIEW COMPARISON:  05/01/2021 and older studies. FINDINGS: Since the previous day's study, the endotracheal tube and nasal/orogastric tube have been removed. Left PICC is stable. Hazy opacity now noted in the central right upper lobe. Persistent opacity noted at the left lung base with milder opacity at the right lung base. Possible small left pleural effusion. No pneumothorax. IMPRESSION: 1. New area of hazy opacity in the central right upper lobe, which may reflect  atelectasis or infection. 2. Persistent left lower lung opacity and milder right lung base opacity, consistent with atelectasis. Suspect small left effusion. 3. No convincing pulmonary edema. 4. Status post extubation and removal of the orogastric tube. Electronically Signed   By: Lajean Manes M.D.   On: 05/02/2021 13:02   ? ?Anti-infectives: ?Anti-infectives (From admission, onward)  ? ? Start     Dose/Rate Route Frequency Ordered Stop  ? 04/30/21 1730  Ampicillin-Sulbactam (UNASYN) 3 g in sodium chloride 0.9 % 100 mL IVPB       ? 3 g ?200 mL/hr over 30 Minutes Intravenous Every 6 hours 04/30/21 1630 05/04/21 2359  ? ?  ? ? ?Assessment/Plan: ?s/p Procedure(s): ?OPEN UMBILICAL AND EPIGASTRIC HERNIA REPAIR ?HERNIA REPAIR INGUINAL WITH MESH ?LYSIS OF ADHESION ?Chronically incarcerated complex ventral hernia ?- 04/30/21 - Dr. Thermon Leyland - open primary repair of chronically incarcerated ventral incisional hernia, primary right inguinal hernia repair without mesh, extensive lysis of adhesions 4 hours ?- Patient remained intubated after surgery but tolerated extubation ?Severe protein calorie malnutrition - prealbumin 6.2 (3/20) from 5.5. continue TPN, starting regular diet ?Anasarca ?Anemia of chronic disease and iron deficiency anemia - Hgb 9.6 today ?Deconditioning - PT/OT ?  ?ID - On Unasyn for presumed aspiration ?FEN - TPN, regular diet ?VTE - lovenox ?Foley - out ?Dispo - to 4NP, reg diet ? LOS: 10 days  ? ? ?[name], MD, MPH, FACS ?Trauma & General Surgery ?Use [email or web address]  to contact on call provider ? ?05/03/2021  ?

## 2021-05-03 NOTE — Progress Notes (Signed)
Occupational Therapy Treatment ?Patient Details ?Name: Logan Nelson ?MRN: [id number] ?DOB: 1965-10-27 ?Today's Date: 05/03/2021 ? ? ?History of present illness Pt is a 56 y/o male who presents for admission 04/23/21 for total parenteral nurtrition and treatment of malnutrition before hernia repairs. Hernia repair on 04/30/2021. PMH significant for anemia, arthritis, dyspnea, GERD, HTN, LE edema, C4-C5 narrowing and R sided weakness, numbness, dysmetria.  Underwent incarcerated periumbilical incisional hernia, epigastric incisional hernia, right inguinal hernia 3/24.  Sent to ICU post surgery intubated.  Now extubated 3/25. ?  ?OT comments ? Pt. Seen for skilled OT treatment session.  Pt. Able to complete bed mobility and step pivot to recliner.  Heavy reliance on UE support.  Cues for sequencing and hand placement on arm rests.  Cont. To progress adls as able next session.    ? ?Recommendations for follow up therapy are one component of a multi-disciplinary discharge planning process, led by the attending physician.  Recommendations may be updated based on patient status, additional functional criteria and insurance authorization. ?   ?Follow Up Recommendations ? Acute inpatient rehab (3hours/day)  ?  ?Assistance Recommended at Discharge Frequent or constant Supervision/Assistance  ?Patient can return home with the following ? A lot of help with walking and/or transfers;A lot of help with bathing/dressing/bathroom;Assist for transportation;Assistance with cooking/housework ?  ?Equipment Recommendations ? BSC/3in1;Tub/shower bench;Wheelchair cushion (measurements OT);Wheelchair (measurements OT)  ?  ?Recommendations for Other Services Rehab consult ? ?  ?Precautions / Restrictions Precautions ?Precautions: Fall ?Precaution Comments: R sided weakness, watch HR. NGT to suction, A-line  ? ? ?  ? ?Mobility Bed Mobility ?Overal bed mobility: Needs Assistance ?Bed Mobility: Supine to Sit ?  ?  ?Supine to sit: Mod  assist ?  ?  ?General bed mobility comments: mod a to guide trunk upright with hob elevated. managed b les without assistance ?  ? ?Transfers ?Overall transfer level: Needs assistance ?Equipment used: 1 person hand held assist ?Transfers: Sit to/from Stand, Bed to chair/wheelchair/BSC ?Sit to Stand: Min assist ?  ?  ?Step pivot transfers: Min assist ?  ?  ?  ?  ?  ?Balance   ?  ?  ?  ?  ?  ?  ?  ?  ?  ?  ?  ?  ?  ?  ?  ?  ?  ?  ?   ? ?ADL either performed or assessed with clinical judgement  ? ?ADL Overall ADL's : Needs assistance/impaired ?  ?  ?  ?  ?  ?  ?  ?  ?  ?  ?  ?  ?Toilet Transfer: Min guard ?Toilet Transfer Details (indicate cue type and reason): observed during transfer with steps to recliner ?  ?  ?  ?  ?Functional mobility during ADLs: Minimal assistance ?General ADL Comments: no AD heavy reliance on UE support and reaches for stabalization.  cues for sequencing and hand placement on arm rests ?  ? ?Extremity/Trunk Assessment   ?  ?  ?  ?  ?  ? ?Vision   ?  ?  ?Perception   ?  ?Praxis   ?  ? ?Cognition Arousal/Alertness: Awake/alert ?Behavior During Therapy: [facility] for tasks assessed/performed, Agitated ?  ?  ?  ?  ?  ?  ?  ?  ?  ?  ?  ?  ?  ?  ?  ?  ?  ?General Comments: pt. fluctuations with agitation, only wants to be called Logan Nelson no other pleasantries  ie: "friend" did not like that and only wants to be addressed as Logan Nelson ?  ?  ?   ?Exercises   ? ?  ?Shoulder Instructions   ? ? ?  ?General Comments    ? ? ?Pertinent Vitals/ Pain       Pain Assessment ?Pain Assessment: No/denies pain ? ?Home Living   ?  ?  ?  ?  ?  ?  ?  ?  ?  ?  ?  ?  ?  ?  ?  ?  ?  ?  ? ?  ?Prior Functioning/Environment    ?  ?  ?  ?   ? ?Frequency ? Min 2X/week  ? ? ? ? ?  ?Progress Toward Goals ? ?OT Goals(current goals can now be found in the care plan section) ? Progress towards OT goals: Progressing toward goals ? ?   ?Plan     ? ?Co-evaluation ? ? ?   ?  ?  ?  ?  ? ?  ?AM-PAC OT "6 Clicks" Daily Activity     ?Outcome  Measure ? ? Help from another person eating meals?: Total ?Help from another person taking care of personal grooming?: A Little ?Help from another person toileting, which includes using toliet, bedpan, or urinal?: A Lot ?Help from another person bathing (including washing, rinsing, drying)?: A Lot ?Help from another person to put on and taking off regular upper body clothing?: A Lot ?Help from another person to put on and taking off regular lower body clothing?: A Lot ?6 Click Score: 12 ? ?  ?End of Session   ? ?OT Visit Diagnosis: Unsteadiness on feet (R26.81);Other abnormalities of gait and mobility (R26.89);Muscle weakness (generalized) (M62.81);History of falling (Z91.81);Hemiplegia and hemiparesis ?Hemiplegia - Right/Left: Right ?Hemiplegia - dominant/non-dominant: Dominant ?Hemiplegia - caused by: Unspecified ?  ?Activity Tolerance Patient tolerated treatment well ?  ?Patient Left in chair;with chair alarm set ?  ?Nurse Communication Other (comment) (cna was present for the transfer) ?  ? ?   ? ?Time: 1055-1106 ?OT Time Calculation (min): 11 min ? ?Charges: OT General Charges ?$OT Visit: 1 Visit ?OT Treatments ?$Self Care/Home Management : 8-22 mins ? ?Logan Nelson, COTA/L ?Acute Rehabilitation ?[phone]  ? ?Logan Nelson ?05/03/2021, 12:32 PM ?

## 2021-05-04 LAB — GLUCOSE, CAPILLARY
Glucose-Capillary: 126 mg/dL — ABNORMAL HIGH (ref 70–99)
Glucose-Capillary: 137 mg/dL — ABNORMAL HIGH (ref 70–99)
Glucose-Capillary: 101 mg/dL — ABNORMAL HIGH (ref 70–99)
Glucose-Capillary: 88 mg/dL (ref 70–99)
Glucose-Capillary: 105 mg/dL — ABNORMAL HIGH (ref 70–99)
Glucose-Capillary: 70 mg/dL (ref 70–99)

## 2021-05-04 LAB — CBC
HCT: 26.7 % — ABNORMAL LOW (ref 39.0–52.0)
Hemoglobin: 8.7 g/dL — ABNORMAL LOW (ref 13.0–17.0)
MCH: 30.2 pg (ref 26.0–34.0)
MCHC: 32.6 g/dL (ref 30.0–36.0)
MCV: 92.7 fL (ref 80.0–100.0)
Platelets: 154 10*3/uL (ref 150–400)
RBC: 2.88 MIL/uL — ABNORMAL LOW (ref 4.22–5.81)
RDW: 16.7 % — ABNORMAL HIGH (ref 11.5–15.5)
WBC: 10 10*3/uL (ref 4.0–10.5)
nRBC: 0 % (ref 0.0–0.2)

## 2021-05-04 LAB — BASIC METABOLIC PANEL
Anion gap: 6 (ref 5–15)
BUN: 30 mg/dL — ABNORMAL HIGH (ref 6–20)
CO2: 23 mmol/L (ref 22–32)
Calcium: 7.6 mg/dL — ABNORMAL LOW (ref 8.9–10.3)
Chloride: 112 mmol/L — ABNORMAL HIGH (ref 98–111)
Creatinine, Ser: 0.54 mg/dL — ABNORMAL LOW (ref 0.61–1.24)
GFR, Estimated: >60 mL/min (ref >60)
Glucose, Bld: 115 mg/dL — ABNORMAL HIGH (ref 70–99)
Potassium: 3.1 mmol/L — ABNORMAL LOW (ref 3.5–5.1)
Sodium: 141 mmol/L (ref 135–145)

## 2021-05-04 MED ORDER — POTASSIUM CHLORIDE 20 MEQ PO PACK
40.0000 meq | PACK | Freq: Once | ORAL | Status: AC
  Administered 2021-05-04: 40 meq via ORAL
  Filled 2021-05-04: qty 2

## 2021-05-04 NOTE — Progress Notes (Signed)
Mobility Specialist: Progress Note ? ? 05/04/21 1428  ?Mobility  ?Activity Refused mobility  ? ?Pt refused mobility stating he isn't going to walk the rest of the [place]. Despite encouragement pt still refusing. Will f/u as able.  ? ?Harrell Gave Makayla Lanter ?Mobility Specialist ?Mobility Specialist [place]: [phone] ?Mobility Specialist [place]: [phone] ? ?

## 2021-05-04 NOTE — Progress Notes (Signed)
Inpatient Rehabilitation Admissions Coordinator  ? ?I spoke with patient's next of kin, Varney Biles, by phone. She states he was living with her stepfather prior to admit since Spring of last year. Stepfather has now said that he is unable to continue to care for him. Varney Biles is unable to take him in either and is seeking placement. I will notify acute team and TOC. Patient not a candidate for CIR at this time as he has no dispo. ? ?Danne Baxter, RN, MSN ?Rehab Admissions Coordinator ?[phone] ?05/04/2021 12:39 PM ? ?

## 2021-05-04 NOTE — Progress Notes (Signed)
Patient ID: Logan Nelson, male   DOB: 09/09/1965, 56 y.o.   MRN: [id number] ?4 Days Post-Op  ?  ?Subjective: ?Moving bowels, tolerating diet ?ROS negative except as listed above. ?Objective: ?Vital signs in last 24 hours: ?Temp:  [97.8 ?F (36.6 ?C)-99.6 ?F (37.6 ?C)] 98.6 ?F (37 ?C) (03/28 0310) ?Pulse Rate:  [97-121] 102 (03/28 0310) ?Resp:  [20-22] 20 (03/27 1930) ?BP: (144-156)/(96-106) 144/96 (03/28 0310) ?SpO2:  [90 %-94 %] 90 % (03/28 0310) ?Last BM Date : 05/03/21 ? ?Intake/Output from previous day: ?03/27 0701 - 03/28 0700 ?In: 1730 [P.O.:480; I.V.:1000; IV Piggyback:250] ?Out: 553 [Urine:550; Stool:3] ?Intake/Output this shift: ?No intake/output data recorded. ? ?General appearance: cooperative ?Resp: clear to auscultation bilaterally ?Cardio: regular rate and rhythm ?GI: soft, dressings CDI ? ?Lab Results: ?CBC  ?Recent Labs  ?  05/03/21 ?0435 05/04/21 ?0623  ?WBC 10.9* 10.0  ?HGB 9.6* 8.7*  ?HCT 28.4* 26.7*  ?PLT 137* 154  ? ?BMET ?Recent Labs  ?  05/03/21 ?0435 05/04/21 ?7628  ?NA 145 141  ?K 3.3* 3.1*  ?CL 119* 112*  ?CO2 23 23  ?GLUCOSE 97 115*  ?BUN 28* 30*  ?CREATININE 0.60* 0.54*  ?CALCIUM 8.0* 7.6*  ? ?PT/INR ?No results for input(s): LABPROT, INR in the last 72 hours. ?ABG ?No results for input(s): PHART, HCO3 in the last 72 hours. ? ?Invalid input(s): PCO2, PO2 ? ?Studies/Results: ?No results found. ? ?Anti-infectives: ?Anti-infectives (From admission, onward)  ? ? Start     Dose/Rate Route Frequency Ordered Stop  ? 04/30/21 1730  Ampicillin-Sulbactam (UNASYN) 3 g in sodium chloride 0.9 % 100 mL IVPB       ? 3 g ?200 mL/hr over 30 Minutes Intravenous Every 6 hours 04/30/21 1630 05/05/21 0559  ? ?  ? ? ?Assessment/Plan: ?Chronically incarcerated complex ventral hernia ?- 04/30/21 - Dr. Dossie Der - open primary repair of chronically incarcerated ventral incisional hernia, primary right inguinal hernia repair without mesh, extensive lysis of adhesions 4 hours ?Severe protein calorie  malnutrition - prealbumin 6.2 (3/20) from 5.5. continue TPN, starting regular diet ?Anasarca ?Anemia of chronic disease and iron deficiency anemia - Hgb 9.6 today ?Deconditioning - PT/OT ?  ?ID - On Unasyn for presumed aspiration to complete tomorrow ?FEN - D/C TNA, replete hypokalemia, reg diet ?VTE - lovenox ?Foley - out ?Dispo - therapies rec CIR - CIR consulted. He is willing to go  ? ? LOS: 11 days  ? ? ?Violeta Gelinas, MD, MPH, FACS ?Trauma & General Surgery ?Use [email or web address] to contact on call provider ? ?05/04/2021  ?

## 2021-05-05 LAB — CBC
HCT: 27.7 % — ABNORMAL LOW (ref 39.0–52.0)
Hemoglobin: 9.2 g/dL — ABNORMAL LOW (ref 13.0–17.0)
MCH: 30.6 pg (ref 26.0–34.0)
MCHC: 33.2 g/dL (ref 30.0–36.0)
MCV: 92 fL (ref 80.0–100.0)
Platelets: 158 10*3/uL (ref 150–400)
RBC: 3.01 MIL/uL — ABNORMAL LOW (ref 4.22–5.81)
RDW: 16 % — ABNORMAL HIGH (ref 11.5–15.5)
WBC: 9.5 10*3/uL (ref 4.0–10.5)
nRBC: 0 % (ref 0.0–0.2)

## 2021-05-05 LAB — BASIC METABOLIC PANEL
Anion gap: 6 (ref 5–15)
BUN: 24 mg/dL — ABNORMAL HIGH (ref 6–20)
CO2: 23 mmol/L (ref 22–32)
Calcium: 7.4 mg/dL — ABNORMAL LOW (ref 8.9–10.3)
Chloride: 110 mmol/L (ref 98–111)
Creatinine, Ser: 0.49 mg/dL — ABNORMAL LOW (ref 0.61–1.24)
GFR, Estimated: >60 mL/min (ref >60)
Glucose, Bld: 70 mg/dL (ref 70–99)
Potassium: 3 mmol/L — ABNORMAL LOW (ref 3.5–5.1)
Sodium: 139 mmol/L (ref 135–145)

## 2021-05-05 LAB — GLUCOSE, CAPILLARY
Glucose-Capillary: 75 mg/dL (ref 70–99)
Glucose-Capillary: 92 mg/dL (ref 70–99)

## 2021-05-05 MED ORDER — METHOCARBAMOL 500 MG PO TABS: 500.0000 mg | ORAL_TABLET | Freq: Three times a day (TID) | ORAL | Status: DC

## 2021-05-05 MED ORDER — FENTANYL CITRATE PF 50 MCG/ML IJ SOSY: 25.0000 ug | PREFILLED_SYRINGE | INTRAMUSCULAR | Status: DC | PRN

## 2021-05-05 MED ORDER — CALCIUM CARBONATE ANTACID 500 MG PO CHEW: 400.0000 mg | CHEWABLE_TABLET | Freq: Three times a day (TID) | ORAL | Status: DC

## 2021-05-05 MED ORDER — AMLODIPINE BESYLATE 2.5 MG PO TABS: 2.5000 mg | ORAL_TABLET | Freq: Every day | ORAL | Status: DC

## 2021-05-05 MED ORDER — POTASSIUM CHLORIDE CRYS ER 20 MEQ PO TBCR: 40.0000 meq | EXTENDED_RELEASE_TABLET | Freq: Three times a day (TID) | ORAL | Status: DC

## 2021-05-05 MED ORDER — OXYCODONE HCL 5 MG PO TABS
5.0000 mg | ORAL_TABLET | ORAL | Status: DC | PRN
  Filled 2021-05-05: qty 2

## 2021-05-05 MED ORDER — ACETAMINOPHEN 325 MG PO TABS
650.0000 mg | ORAL_TABLET | Freq: Four times a day (QID) | ORAL | Status: DC
Start: 2021-05-05 — End: 2021-05-05

## 2021-05-05 NOTE — TOC Initial Note (Signed)
Transition of Care (TOC) - Initial/Assessment Note  ? ? ?Patient Details  ?Name: Logan Nelson ?MRN: [id number] ?Date of Birth: 13-Jul-1965 ? ?Transition of Care (TOC) CM/SW Contact:    ?Beckie Busing, RN ?Phone Number:[phone] ? ?05/05/2021, 10:07 AM ? ?Clinical Narrative:                 ?TOC consulted for patient with high risk for readmission. Patient states that he is from home where he lives with his granddaughters grandfather. Patient states that he understands that he will not receive rehab placement due to no insurance.CM has informed patient that it is unlikely that rehab facility will accept without payor source. Patient verbalized understanding and hopes that he has a few week left where he is currently living. Patient states that he plans to go back to live with granddaughters grandfather upon discharge. Per patient his PCP is [name] on [employer] Patient states that he gets to his appointments the best way that he can and getting his medications is basically the same. Patient reports that he does have rolling walker at home. Patient is unsure about any type of home health because he is not sure how long he will be at [address] and expresses hope that he will not get put out right now. CM will update Sw and TOC will continue to follow to offer any resources that we may have available. TOC can provide 30 day supply of meds prior to d/c if MD will send scripts to [facility] pharmacy. TOC is willing to provide letter of guarantee for home health services if patient is willing to accept and will be in a safe environment.  ? ?Expected Discharge Plan: Home/Self Care ?Barriers to Discharge: Continued Medical Work up, Inadequate or no insurance ? ? ?Patient Goals and CMS Choice ?Patient states their goals for this hospitalization and ongoing recovery are:: Patient is ready to go home ?CMS Medicare.gov Compare Post Acute Care list provided to:: Patient ?Choice offered to / list presented  to : Patient ? ?Expected Discharge Plan and Services ?Expected Discharge Plan: Home/Self Care ?In-house Referral: NA ?Discharge Planning Services: CM Consult ?  ?Living arrangements for the past 2 months: Single Family Home ?                ?DME Arranged: N/A ?DME Agency: NA ?  ?  ?  ?  ?  ?  ?  ?  ? ?Prior Living Arrangements/Services ?Living arrangements for the past 2 months: Single Family Home ?Lives with:: Other (Comment) (with granddaughters grandfather) ?Patient language and need for interpreter reviewed:: Yes ?Do you feel safe going back to the place where you live?: Yes      ?Need for Family Participation in Patient Care: No (Comment) ?Care giver support system in place?: No (comment) ?  ?Criminal Activity/Legal Involvement Pertinent to Current Situation/Hospitalization: No - Comment as needed ? ?Activities of Daily Living ?Home Assistive Devices/Equipment: Dan Humphreys (specify type) (standard front- wheeled walker) ?ADL Screening (condition at time of admission) ?Patient's cognitive ability adequate to safely complete daily activities?: No ?Is the patient deaf or have difficulty hearing?: No ?Does the patient have difficulty seeing, even when wearing glasses/contacts?: No ?Does the patient have difficulty concentrating, remembering, or making decisions?: No ?Patient able to express need for assistance with ADLs?: Yes ?Does the patient have difficulty dressing or bathing?: Yes ?Independently performs ADLs?: No (Needs assisitance) ?Communication: Needs assistance ?Dressing (OT): Needs assistance ?Is this a change from baseline?: Pre-admission  baseline ?Grooming: Needs assistance ?Is this a change from baseline?: Pre-admission baseline ?Feeding: Independent ?Bathing: Needs assistance ?Is this a change from baseline?: Pre-admission baseline ?Toileting: Independent ?In/Out Bed: Needs assistance ?Is this a change from baseline?: Pre-admission baseline ?Walks in Home: Independent with device (comment) ?Does the  patient have difficulty walking or climbing stairs?: Yes ?Weakness of Legs: Both ?Weakness of Arms/Hands: Right ? ?Permission Sought/Granted ?  ?Permission granted to share information with : No ?   ?   ?   ?   ? ?Emotional Assessment ?Appearance:: Appears older than stated age ?Attitude/Demeanor/Rapport: Engaged ?Affect (typically observed): Accepting ?Orientation: : Oriented to Self, Oriented to Place, Oriented to  Time ?Alcohol / Substance Use: Not Applicable ?Psych Involvement: No (comment) ? ?Admission diagnosis:  Bilateral inguinal hernia [K40.20] ?Patient Active Problem List  ? Diagnosis Date Noted  ? Bilateral inguinal hernia 04/23/2021  ? Protein-calorie malnutrition, severe 04/14/2021  ? Protein calorie malnutrition (HCC) 04/13/2021  ? Protein-calorie malnutrition (HCC) 04/12/2021  ? Hepatic steatosis 04/12/2021  ? Stroke [facility]) 04/11/2021  ? Small bowel obstruction (HCC) 10/13/2020  ? Anemia due to vitamin B12 deficiency 10/13/2020  ? Hypertension 01/09/2015  ? 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 ?PCP:  [name], MD ?Pharmacy:   ?[id number] - [place], [place] - [address] [facility] [address] ?[address] ?[place] [address] ?Phone: [phone] Fax: [phone] ? ?[facility] ?[address] ?[place] [address] ?Phone: [phone] Fax: [phone] ? ? ? ? ?Social Determinants of Health (SDOH) Interventions ?  ? ?Readmission Risk Interventions ? ?  05/05/2021  ? 10:01 AM  ?Readmission Risk Prevention Plan  ?Transportation Screening Complete  ?Medication Review Oceanographer) Referral to Pharmacy  ?PCP or Specialist appointment within 3-5 days of discharge Complete  ?HRI or Home Care Consult Patient refused  ?SW Recovery Care/Counseling Consult Complete  ?Palliative Care Screening Not Applicable  ?Skilled Nursing Facility Complete  ? ? ? ?

## 2021-05-05 NOTE — Progress Notes (Signed)
IV team on the way to remove patient's PICC line. Dr. Bedelia Person was paged and made aware that patient is leaving AMA. MD came and spoke with patient who is sitting in the hall in a wheelchair with his friend. Patient still insisting he is leaving AMA even after Dr. Bedelia Person spoke with him.  ?

## 2021-05-05 NOTE — Progress Notes (Signed)
Pt noted to be attempting to leave unit. This RN attempted to educate pt on needing further evaluation and care before d/c. States he wants to leave AMA. Primary RN informed. AMA paperwork signed. PIV removed. Awaiting IV team to remove PICC line and provider to come to bedside.  ?

## 2021-05-05 NOTE — Progress Notes (Signed)
Patient left AMA. Alert with no distress noted with friend pushing him by wheelchair when leaving the unit.  ?

## 2021-05-05 NOTE — Progress Notes (Signed)
? ?Progress Note ? ?5 Days Post-Op  ?Subjective: ?Pt agitated stating he needs to leave because he is homeless and he will find a rehab center for himself. He states if he is unable to find rehab then he will come back. Patient is A&O x 4 but clearly agitated. He is distended but having bowel function. He reports abdominal pain.  ? ?Objective: ?Vital signs in last 24 hours: ?Temp:  [97.1 ?F (36.2 ?C)-99.2 ?F (37.3 ?C)] 97.1 ?F (36.2 ?C) (03/29 1610) ?Pulse Rate:  [90-103] 103 (03/29 0738) ?Resp:  [14-19] 19 (03/29 0738) ?BP: (120-158)/(92-115) 155/114 (03/29 9604) ?SpO2:  [88 %-94 %] 88 % (03/29 0738) ?Last BM Date : 05/04/21 ? ?Intake/Output from previous day: ?03/28 0701 - 03/29 0700 ?In: 2236.8 [P.O.:1200; I.V.:386.8; IV Piggyback:650] ?Out: 1100 [Urine:1100] ?Intake/Output this shift: ?Total I/O ?In: 360 [P.O.:360] ?Out: -  ? ?PE: ?General: WD, chronically malnourished appearing male who is sitting up and agitated ?Heart: regular, rate, and rhythm.   ?Lungs: Respiratory effort nonlabored ?Abd: soft, distended, honeycomb dressing present, sitting in loose stool  ?MS: BLE edema that is mild ?Psych: A&Ox4, agitated  ? ? ?Lab Results:  ?Recent Labs  ?  05/04/21 ?5409 05/05/21 ?0323  ?WBC 10.0 9.5  ?HGB 8.7* 9.2*  ?HCT 26.7* 27.7*  ?PLT 154 158  ? ?BMET ?Recent Labs  ?  05/04/21 ?8119 05/05/21 ?0323  ?NA 141 139  ?K 3.1* 3.0*  ?CL 112* 110  ?CO2 23 23  ?GLUCOSE 115* 70  ?BUN 30* 24*  ?CREATININE 0.54* 0.49*  ?CALCIUM 7.6* 7.4*  ? ?PT/INR ?No results for input(s): LABPROT, INR in the last 72 hours. ?CMP  ?   ?Component Value Date/Time  ? NA 139 05/05/2021 0323  ? NA 143 12/08/2020 1103  ? K 3.0 (L) 05/05/2021 0323  ? CL 110 05/05/2021 0323  ? CO2 23 05/05/2021 0323  ? GLUCOSE 70 05/05/2021 0323  ? BUN 24 (H) 05/05/2021 0323  ? BUN 14 12/08/2020 1103  ? CREATININE 0.49 (L) 05/05/2021 0323  ? CALCIUM 7.4 (L) 05/05/2021 0323  ? PROT 4.3 (L) 05/03/2021 0435  ? PROT 6.7 12/08/2020 1103  ? ALBUMIN <1.5 (L) 05/03/2021 0435   ? ALBUMIN 3.8 12/08/2020 1103  ? AST 112 (H) 05/03/2021 0435  ? ALT 93 (H) 05/03/2021 0435  ? ALKPHOS 194 (H) 05/03/2021 0435  ? BILITOT 0.8 05/03/2021 0435  ? BILITOT 0.2 12/08/2020 1103  ? GFRNONAA >60 05/05/2021 0323  ? GFRAA >60 01/23/2019 1523  ? ?Lipase  ?   ?Component Value Date/Time  ? LIPASE 28 12/17/2020 0924  ? ? ? ? ? ?Studies/Results: ?No results found. ? ?Anti-infectives: ?Anti-infectives (From admission, onward)  ? ? Start     Dose/Rate Route Frequency Ordered Stop  ? 04/30/21 1730  Ampicillin-Sulbactam (UNASYN) 3 g in sodium chloride 0.9 % 100 mL IVPB       ? 3 g ?200 mL/hr over 30 Minutes Intravenous Every 6 hours 04/30/21 1630 05/05/21 0811  ? ?  ? ? ? ?Assessment/Plan ?Chronically incarcerated complex ventral hernia ?POD5 S/P open primary repair of chronically incarcerated ventral incisional hernia, primary right inguinal hernia repair without mesh, extensive lysis of adhesions 4 hours 04/30/21 - Dr. Dossie Der ?- having bowel function and on D3 diet - incontinent of stool ?- start PO pain meds today ?- ok to shower from a surgical standpoint after PICC is removed ?- continue to encourage mobilization  ? ?Severe protein calorie malnutrition - prealbumin 6.2 (3/20) from 5.5. supplements. TPN  stopped yesterday, remove PICC ?Anasarca ?Anemia of chronic disease and iron deficiency anemia - Hgb 9.2 today, stable ?Deconditioning - PT/OT, unable to go to CIR, will need SNF placement unless he improves enough in the interim ?HTN - restart 2.5 mg amlodopine, pt was on this previously ?Agitation - will discuss with MD, pt stating he want to go home. He is not medically stable for discharge at this time. He is alert and oriented x4 ?  ?ID - unasyn completed ?FEN - D3 Diet, replace K and Ca ?VTE - lovenox ?Foley - out ? ?Dispo - SNF placement. Patient is wanting to leave but is not medically appropriate for discharge at this time. He does not have a safe disposition in place currently.   ? LOS: 12 days   ? ? ?Juliet Rude, PA-C ?[facility] ?05/05/2021, 10:24 AM ?Please see Amion for pager number during day hours 7:00am-4:30pm ? ?

## 2021-05-05 NOTE — Progress Notes (Signed)
Patient refused to wear oxygen while eating breakfast. O2 sats 88 %. Encouraged patient to wear oxygen but patient states he will after he finishes eating. No respiratory distress noted.  ?

## 2021-05-05 NOTE — Progress Notes (Signed)
IV Team paged x2 at 1042.  RN in patient care mid PIV placement.  Overhead page for IV Therapy as RN coming out of room at 1057; RN called unit at 1058.  Upon arrival, IVT RN Jeanie Cooks in hallway with pt in wheelchair.  Pt being given risks of not returning to bed for d/c of PICC and post picc removal.  Pt states understanding and asks to proceed with removal in hallway, in wheelchair.  RN, Jeanie Cooks removed per pt's request.  RN, Hospital doctor a witness.   ?

## 2021-05-10 ENCOUNTER — Encounter (HOSPITAL_COMMUNITY): Payer: Self-pay | Admitting: Oncology

## 2021-05-10 ENCOUNTER — Other Ambulatory Visit: Payer: Self-pay

## 2021-05-10 ENCOUNTER — Emergency Department (HOSPITAL_COMMUNITY): Payer: Medicaid Other

## 2021-05-10 ENCOUNTER — Inpatient Hospital Stay (HOSPITAL_COMMUNITY)
Admission: EM | Admit: 2021-05-10 | Discharge: 2021-06-16 | DRG: 947 | Discharge status: Against Medical Advice | Payer: Medicaid Other | Attending: Internal Medicine | Admitting: Internal Medicine

## 2021-05-10 DIAGNOSIS — T502X5A Adverse effect of carbonic-anhydrase inhibitors, benzothiadiazides and other diuretics, initial encounter: Secondary | ICD-10-CM | POA: Diagnosis not present

## 2021-05-10 DIAGNOSIS — L89150 Pressure ulcer of sacral region, unstageable: Secondary | ICD-10-CM | POA: Diagnosis present

## 2021-05-10 DIAGNOSIS — Z6822 Body mass index (BMI) 22.0-22.9, adult: Secondary | ICD-10-CM

## 2021-05-10 DIAGNOSIS — R262 Difficulty in walking, not elsewhere classified: Secondary | ICD-10-CM | POA: Diagnosis present

## 2021-05-10 DIAGNOSIS — M79643 Pain in unspecified hand: Secondary | ICD-10-CM | POA: Diagnosis not present

## 2021-05-10 DIAGNOSIS — Z751 Person awaiting admission to adequate facility elsewhere: Secondary | ICD-10-CM

## 2021-05-10 DIAGNOSIS — E8809 Other disorders of plasma-protein metabolism, not elsewhere classified: Secondary | ICD-10-CM | POA: Diagnosis present

## 2021-05-10 DIAGNOSIS — I5041 Acute combined systolic (congestive) and diastolic (congestive) heart failure: Secondary | ICD-10-CM | POA: Diagnosis not present

## 2021-05-10 DIAGNOSIS — E43 Unspecified severe protein-calorie malnutrition: Secondary | ICD-10-CM | POA: Diagnosis present

## 2021-05-10 DIAGNOSIS — R7881 Bacteremia: Secondary | ICD-10-CM

## 2021-05-10 DIAGNOSIS — R739 Hyperglycemia, unspecified: Secondary | ICD-10-CM | POA: Diagnosis not present

## 2021-05-10 DIAGNOSIS — A4101 Sepsis due to Methicillin susceptible Staphylococcus aureus: Secondary | ICD-10-CM | POA: Diagnosis not present

## 2021-05-10 DIAGNOSIS — I340 Nonrheumatic mitral (valve) insufficiency: Secondary | ICD-10-CM | POA: Diagnosis present

## 2021-05-10 DIAGNOSIS — R296 Repeated falls: Secondary | ICD-10-CM | POA: Diagnosis present

## 2021-05-10 DIAGNOSIS — K219 Gastro-esophageal reflux disease without esophagitis: Secondary | ICD-10-CM | POA: Diagnosis present

## 2021-05-10 DIAGNOSIS — M25519 Pain in unspecified shoulder: Secondary | ICD-10-CM | POA: Diagnosis not present

## 2021-05-10 DIAGNOSIS — E876 Hypokalemia: Secondary | ICD-10-CM | POA: Diagnosis present

## 2021-05-10 DIAGNOSIS — R64 Cachexia: Secondary | ICD-10-CM | POA: Diagnosis present

## 2021-05-10 DIAGNOSIS — K760 Fatty (change of) liver, not elsewhere classified: Secondary | ICD-10-CM | POA: Diagnosis present

## 2021-05-10 DIAGNOSIS — I10 Essential (primary) hypertension: Secondary | ICD-10-CM | POA: Diagnosis not present

## 2021-05-10 DIAGNOSIS — D638 Anemia in other chronic diseases classified elsewhere: Secondary | ICD-10-CM | POA: Diagnosis present

## 2021-05-10 DIAGNOSIS — I5031 Acute diastolic (congestive) heart failure: Secondary | ICD-10-CM | POA: Diagnosis present

## 2021-05-10 DIAGNOSIS — R601 Generalized edema: Principal | ICD-10-CM | POA: Diagnosis present

## 2021-05-10 DIAGNOSIS — R531 Weakness: Secondary | ICD-10-CM | POA: Diagnosis not present

## 2021-05-10 DIAGNOSIS — R609 Edema, unspecified: Secondary | ICD-10-CM | POA: Diagnosis not present

## 2021-05-10 DIAGNOSIS — K567 Ileus, unspecified: Secondary | ICD-10-CM | POA: Diagnosis not present

## 2021-05-10 DIAGNOSIS — Z7982 Long term (current) use of aspirin: Secondary | ICD-10-CM

## 2021-05-10 DIAGNOSIS — Z5900 Homelessness unspecified: Secondary | ICD-10-CM

## 2021-05-10 DIAGNOSIS — R627 Adult failure to thrive: Secondary | ICD-10-CM | POA: Diagnosis present

## 2021-05-10 DIAGNOSIS — D509 Iron deficiency anemia, unspecified: Secondary | ICD-10-CM | POA: Diagnosis present

## 2021-05-10 DIAGNOSIS — L89153 Pressure ulcer of sacral region, stage 3: Secondary | ICD-10-CM

## 2021-05-10 DIAGNOSIS — L89322 Pressure ulcer of left buttock, stage 2: Secondary | ICD-10-CM | POA: Diagnosis not present

## 2021-05-10 DIAGNOSIS — E559 Vitamin D deficiency, unspecified: Secondary | ICD-10-CM | POA: Diagnosis present

## 2021-05-10 DIAGNOSIS — L89302 Pressure ulcer of unspecified buttock, stage 2: Secondary | ICD-10-CM | POA: Diagnosis present

## 2021-05-10 DIAGNOSIS — E872 Acidosis, unspecified: Secondary | ICD-10-CM | POA: Diagnosis not present

## 2021-05-10 DIAGNOSIS — I471 Supraventricular tachycardia: Secondary | ICD-10-CM | POA: Diagnosis not present

## 2021-05-10 DIAGNOSIS — D75839 Thrombocytosis, unspecified: Secondary | ICD-10-CM | POA: Diagnosis not present

## 2021-05-10 DIAGNOSIS — Z5329 Procedure and treatment not carried out because of patient's decision for other reasons: Secondary | ICD-10-CM | POA: Diagnosis present

## 2021-05-10 DIAGNOSIS — Z87891 Personal history of nicotine dependence: Secondary | ICD-10-CM

## 2021-05-10 DIAGNOSIS — B9561 Methicillin susceptible Staphylococcus aureus infection as the cause of diseases classified elsewhere: Secondary | ICD-10-CM

## 2021-05-10 DIAGNOSIS — I110 Hypertensive heart disease with heart failure: Secondary | ICD-10-CM | POA: Diagnosis present

## 2021-05-10 DIAGNOSIS — R29898 Other symptoms and signs involving the musculoskeletal system: Secondary | ICD-10-CM | POA: Diagnosis present

## 2021-05-10 DIAGNOSIS — Z79899 Other long term (current) drug therapy: Secondary | ICD-10-CM

## 2021-05-10 DIAGNOSIS — M1990 Unspecified osteoarthritis, unspecified site: Secondary | ICD-10-CM | POA: Diagnosis present

## 2021-05-10 LAB — CBC WITH DIFFERENTIAL/PLATELET
Abs Immature Granulocytes: 0.04 10*3/uL (ref 0.00–0.07)
Basophils Absolute: 0 10*3/uL (ref 0.0–0.1)
Basophils Relative: 0 %
Eosinophils Absolute: 0 10*3/uL (ref 0.0–0.5)
Eosinophils Relative: 0 %
HCT: 35.8 % — ABNORMAL LOW (ref 39.0–52.0)
Hemoglobin: 11.5 g/dL — ABNORMAL LOW (ref 13.0–17.0)
Immature Granulocytes: 0 %
Lymphocytes Relative: 14 %
Lymphs Abs: 1.3 10*3/uL (ref 0.7–4.0)
MCH: 30.2 pg (ref 26.0–34.0)
MCHC: 32.1 g/dL (ref 30.0–36.0)
MCV: 94 fL (ref 80.0–100.0)
Monocytes Absolute: 0.4 10*3/uL (ref 0.1–1.0)
Monocytes Relative: 4 %
Neutro Abs: 7.4 10*3/uL (ref 1.7–7.7)
Neutrophils Relative %: 82 %
Platelets: 391 10*3/uL (ref 150–400)
RBC: 3.81 MIL/uL — ABNORMAL LOW (ref 4.22–5.81)
RDW: 16 % — ABNORMAL HIGH (ref 11.5–15.5)
WBC: 9.2 10*3/uL (ref 4.0–10.5)
nRBC: 0 % (ref 0.0–0.2)

## 2021-05-10 LAB — COMPREHENSIVE METABOLIC PANEL
ALT: 91 U/L — ABNORMAL HIGH (ref 0–44)
AST: 58 U/L — ABNORMAL HIGH (ref 15–41)
Albumin: 2 g/dL — ABNORMAL LOW (ref 3.5–5.0)
Alkaline Phosphatase: 166 U/L — ABNORMAL HIGH (ref 38–126)
Anion gap: 5 (ref 5–15)
BUN: 16 mg/dL (ref 6–20)
CO2: 25 mmol/L (ref 22–32)
Calcium: 7.3 mg/dL — ABNORMAL LOW (ref 8.9–10.3)
Chloride: 110 mmol/L (ref 98–111)
Creatinine, Ser: 0.58 mg/dL — ABNORMAL LOW (ref 0.61–1.24)
GFR, Estimated: >60 mL/min (ref >60)
Glucose, Bld: 104 mg/dL — ABNORMAL HIGH (ref 70–99)
Potassium: 2.9 mmol/L — ABNORMAL LOW (ref 3.5–5.1)
Sodium: 140 mmol/L (ref 135–145)
Total Bilirubin: 0.9 mg/dL (ref 0.3–1.2)
Total Protein: 5.4 g/dL — ABNORMAL LOW (ref 6.5–8.1)

## 2021-05-10 LAB — LIPASE, BLOOD: Lipase: 31 U/L (ref 11–51)

## 2021-05-10 LAB — I-STAT CHEM 8, ED
BUN: 19 mg/dL (ref 6–20)
Calcium, Ion: 1.1 mmol/L — ABNORMAL LOW (ref 1.15–1.40)
Chloride: 106 mmol/L (ref 98–111)
Creatinine, Ser: 0.5 mg/dL — ABNORMAL LOW (ref 0.61–1.24)
Glucose, Bld: 100 mg/dL — ABNORMAL HIGH (ref 70–99)
HCT: 36 % — ABNORMAL LOW (ref 39.0–52.0)
Hemoglobin: 12.2 g/dL — ABNORMAL LOW (ref 13.0–17.0)
Potassium: 3 mmol/L — ABNORMAL LOW (ref 3.5–5.1)
Sodium: 145 mmol/L (ref 135–145)
TCO2: 28 mmol/L (ref 22–32)

## 2021-05-10 LAB — URINALYSIS, ROUTINE W REFLEX MICROSCOPIC
Bilirubin Urine: NEGATIVE
Glucose, UA: NEGATIVE mg/dL
Hgb urine dipstick: NEGATIVE
Ketones, ur: NEGATIVE mg/dL
Leukocytes,Ua: NEGATIVE
Nitrite: NEGATIVE
Protein, ur: NEGATIVE mg/dL
Specific Gravity, Urine: 1.013 (ref 1.005–1.030)
pH: 6 (ref 5.0–8.0)

## 2021-05-10 LAB — AMMONIA: Ammonia: 31 umol/L (ref 9–35)

## 2021-05-10 LAB — PROTIME-INR
INR: 1.1 (ref 0.8–1.2)
Prothrombin Time: 14.7 seconds (ref 11.4–15.2)

## 2021-05-10 LAB — ETHANOL: Alcohol, Ethyl (B): <10 mg/dL (ref <10)

## 2021-05-10 LAB — MAGNESIUM: Magnesium: 1.9 mg/dL (ref 1.7–2.4)

## 2021-05-10 IMAGING — MR MR THORACIC SPINE WO/W CM
8 of 10 series · 28 of 48 positions shown · IV contrast (with contrast)
Comparison: CT chest [DATE]
COMPARISON: CT chest [DATE]

Addendum:
CLINICAL DATA: Epidural abscess suspected. Multiple falls, history
of CVA, myelopathy

EXAM:
MRI THORACIC WITHOUT AND WITH CONTRAST
TECHNIQUE: Multiplanar and multiecho pulse sequences of the thoracic spine were
obtained without and with intravenous contrast.
CONTRAST:  6.5mL GADAVIST GADOBUTROL 1 MMOL/ML IV SOLN, 6mL GADAVIST
GADOBUTROL 1 MMOL/ML IV SOLN

[Series 21: T1 · sagittal · 4.0mm · 1.72mm/px · 1 of 5 slices shown (1 of 3)]
[im 1/5]
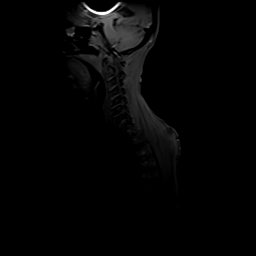

[Series 22: STIR · sagittal · 3.0mm · 1.03mm/px · 2 of 15 slices shown]
[im 1/15]
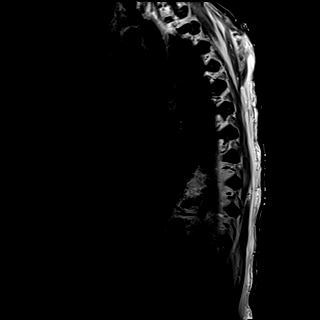
[im 15/15]
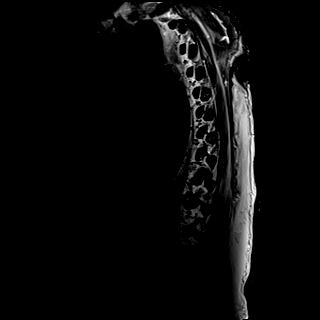

[Series 23: T1 · sagittal · 3.0mm · 1.03mm/px · 2 of 15 slices shown (2 of 3)]
[im 1/15]
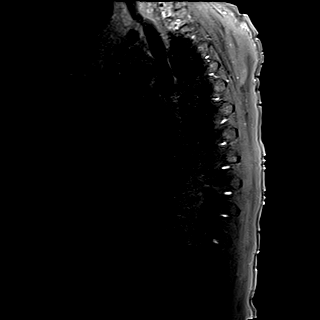
[im 15/15]
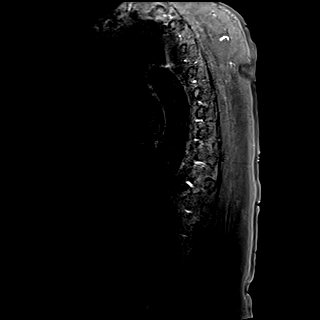

[Series 24: T2 · axial · 4.0mm · 0.78mm/px · z∈[-241,-13]mm · 8 of 43 slices shown]
[im 1/43]
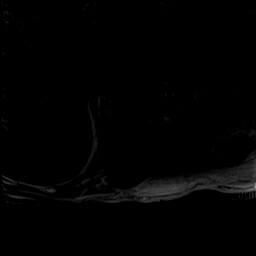
[im 7/43]
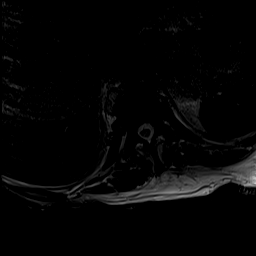
[im 13/43]
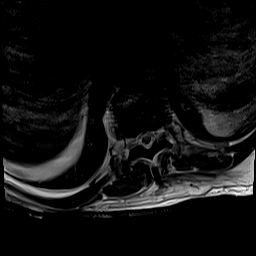
[im 19/43]
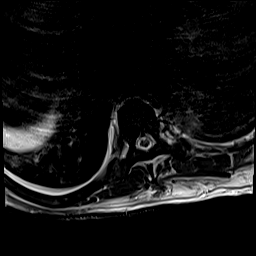
[im 25/43]
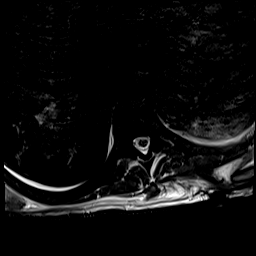
[im 31/43]
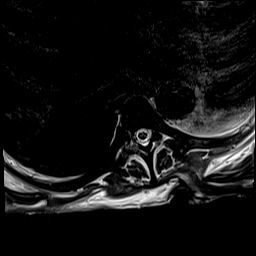
[im 37/43]
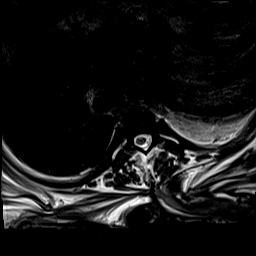
[im 43/43]
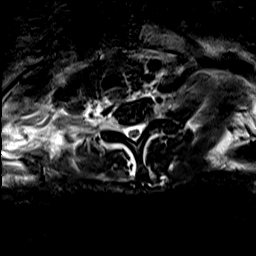

[Series 26: T1 · axial · 4.0mm · 0.39mm/px · z∈[-264,-13]mm · 8 of 47 slices shown (3 of 3)]
[im 1/47]
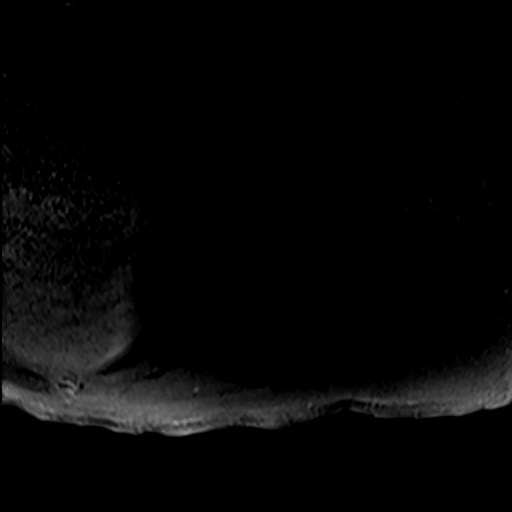
[im 6/47]
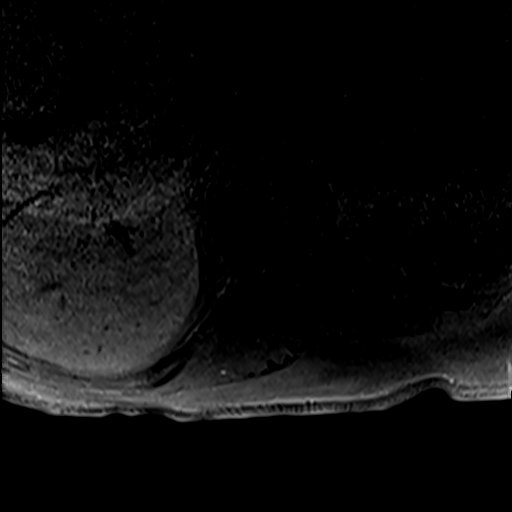
[im 12/47]
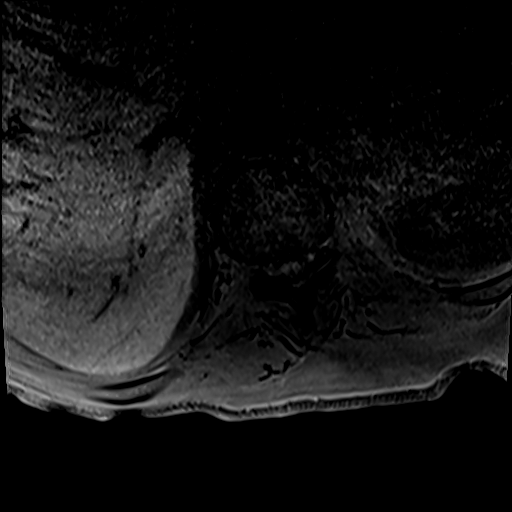
[im 18/47]
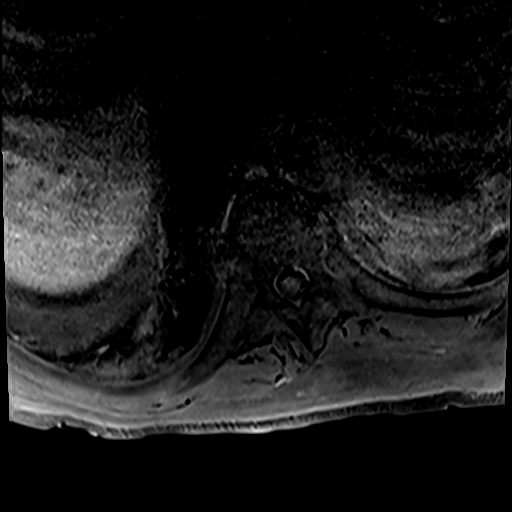
[im 29/47]
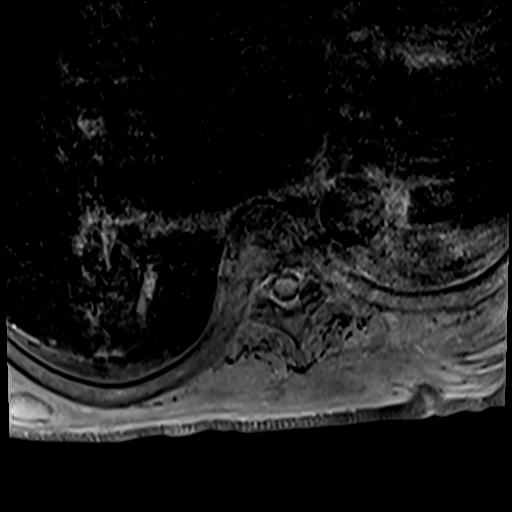
[im 35/47]
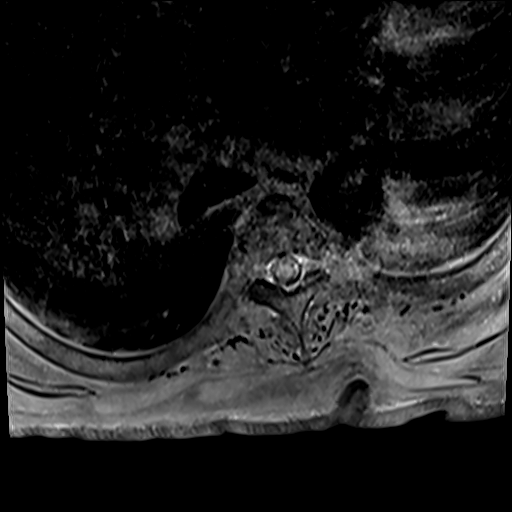
[im 41/47]
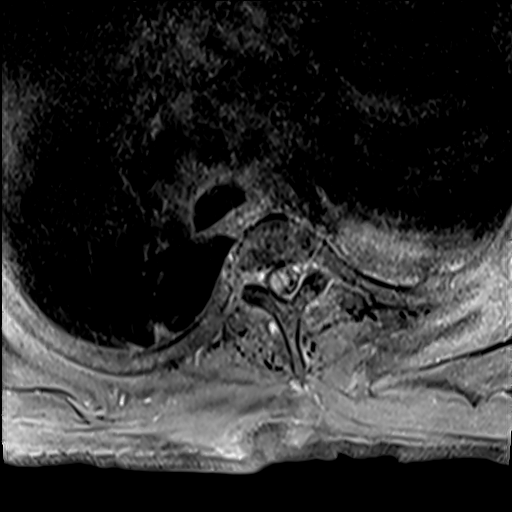
[im 47/47]
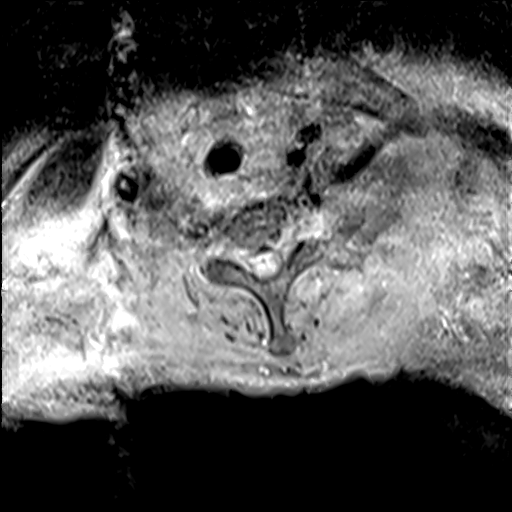

[Series 27: T2 post-contrast · sagittal · 3.0mm · 0.86mm/px · 3 of 15 slices shown]
[im 1/15]
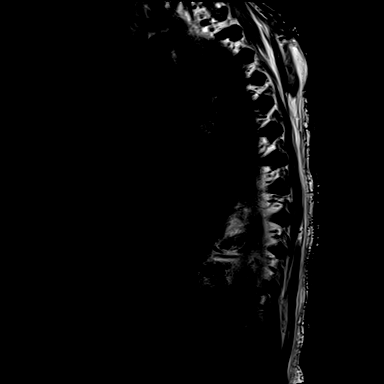
[im 8/15]
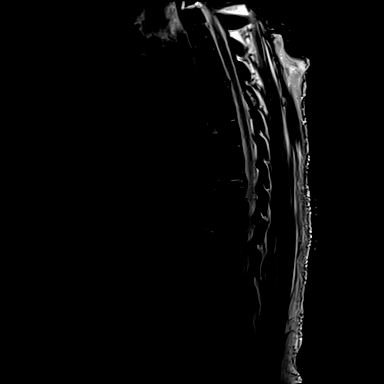
[im 15/15]
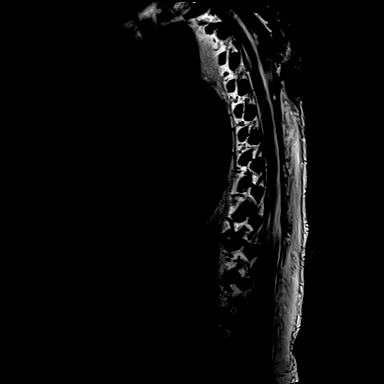

[Series 28: T1 fat-sat post-contrast · sagittal · 3.0mm · 1.03mm/px · 3 of 15 slices shown]
[im 1/15]
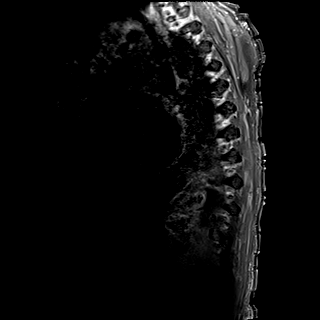
[im 8/15]
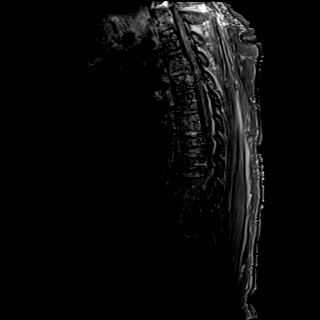
[im 15/15]
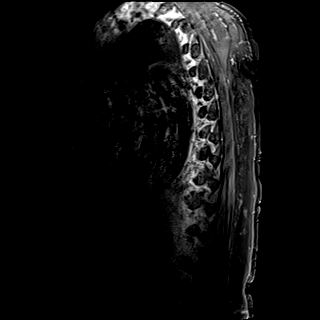

[Series 29: T1 post-contrast · axial · 4.0mm · 0.39mm/px · 1 of 47 slices shown]
[im 1/47]
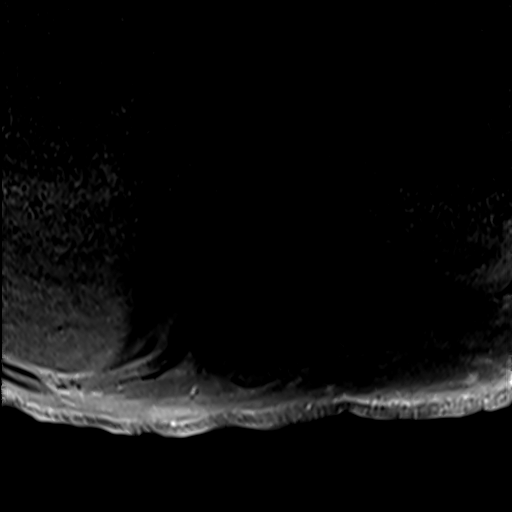

[28 of 48 positions shown; findings below may reference images not displayed]

FINDINGS: Image quality is markedly degraded by motion artifact.

Alignment:  Normal.

Vertebrae: The sagittal T1 precontrast images are fat saturated,
limiting evaluation of background marrow signal. Wedge-shaped T2
hyperintensity along the T9 and T11 inferior endplates is most
likely degenerative in nature when correlated with the appearance on
prior CT. There is no abnormal enhancement of the marrow. There is
no evidence of discitis/osteomyelitis. There is no evidence of
epidural abscess.

Cord: The cord is grossly normal in signal and morphology, within
the confines of motion degraded images. There is no definite
abnormal cord enhancement.

Paraspinal and other soft tissues: Upper abdominal ascites is noted.
Patchy signal abnormality in the lung bases, left more than right is
noted, likely reflecting the small bilateral pleural effusions and
adjacent airspace disease seen on prior CT.

Disc levels:

There is mild multilevel degenerative endplate change and facet
arthropathy. There is no evidence of high-grade spinal canal or
neural foraminal stenosis. There is no evidence of cord or nerve
root compression.
IMPRESSION: 1. Motion degraded study. Within this confine, no evidence of acute
abnormality in the thoracic spine. No evidence of
discitis/osteomyelitis or epidural abscess.
2. Partially imaged ascites and bilateral pleural effusions with
adjacent airspace disease.

ADDENDUM:
There is smooth enhancement along the dorsal aspect of the canal
extending through much of the T spine (best seen on sagittal image
28-9). The appearance is not typical for epidural abscess or
phlegmon, but this cannot be entirely excluded. There is mild edema
in the right facet joint at T4-T5 without osseous destruction to
suggest septic arthritis, favored degenerative in nature (22-6).
Consider follow up imaging in 2-3 days to reassess the intraspinal
findings.

Diffuse soft tissue edema is likely related to anasarca.

This addendum will be called to the ordering clinician or
representative by the Radiologist Assistant, and communication
documented in the PACS or [REDACTED].

*** End of Addendum ***
FINDINGS: Image quality is markedly degraded by motion artifact.

Alignment:  Normal.

Vertebrae: The sagittal T1 precontrast images are fat saturated,
limiting evaluation of background marrow signal. Wedge-shaped T2
hyperintensity along the T9 and T11 inferior endplates is most
likely degenerative in nature when correlated with the appearance on
prior CT. There is no abnormal enhancement of the marrow. There is
no evidence of discitis/osteomyelitis. There is no evidence of
epidural abscess.

Cord: The cord is grossly normal in signal and morphology, within
the confines of motion degraded images. There is no definite
abnormal cord enhancement.

Paraspinal and other soft tissues: Upper abdominal ascites is noted.
Patchy signal abnormality in the lung bases, left more than right is
noted, likely reflecting the small bilateral pleural effusions and
adjacent airspace disease seen on prior CT.

Disc levels:

There is mild multilevel degenerative endplate change and facet
arthropathy. There is no evidence of high-grade spinal canal or
neural foraminal stenosis. There is no evidence of cord or nerve
root compression.
IMPRESSION: 1. Motion degraded study. Within this confine, no evidence of acute
abnormality in the thoracic spine. No evidence of
discitis/osteomyelitis or epidural abscess.
2. Partially imaged ascites and bilateral pleural effusions with
adjacent airspace disease.

## 2021-05-10 IMAGING — CT CT HEAD W/O CM
3 series · 16 of 47 positions shown, 19 images · non-contrast
Comparison: CT imaging from [DATE] MRI of the brain also
from that same date.

CLINICAL DATA: Head trauma, multiple falls.



[Series 2: head wo · axial · 0.47mm/px · z∈[+1366,+1501]mm · 10 of 33 slices shown, 13 images]
[im 3/33  brain]
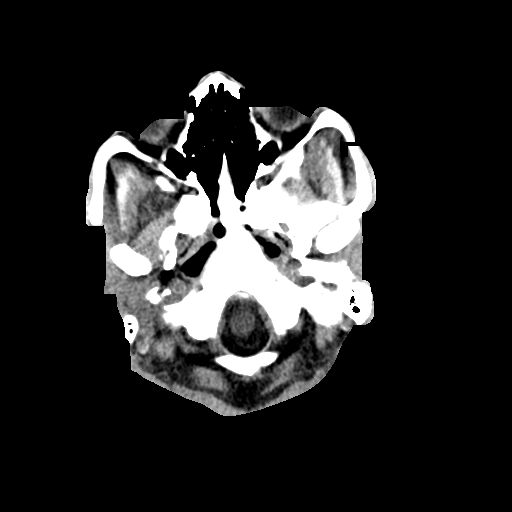
[im 3/33  bone]
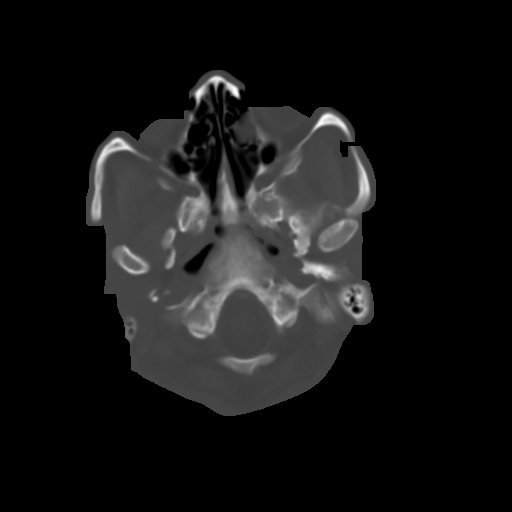
[im 6/33  brain]
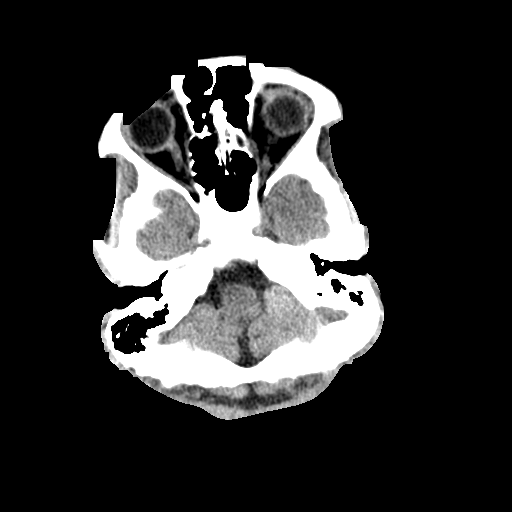
[im 9/33  brain]
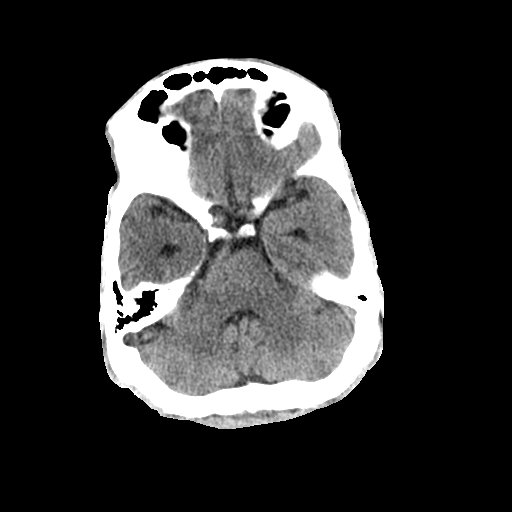
[im 12/33  brain]
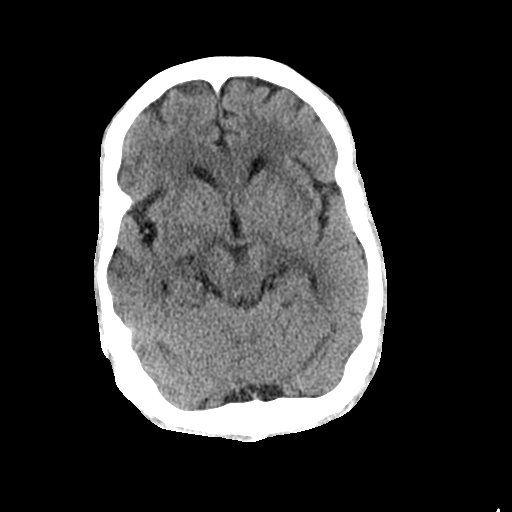
[im 15/33  brain]
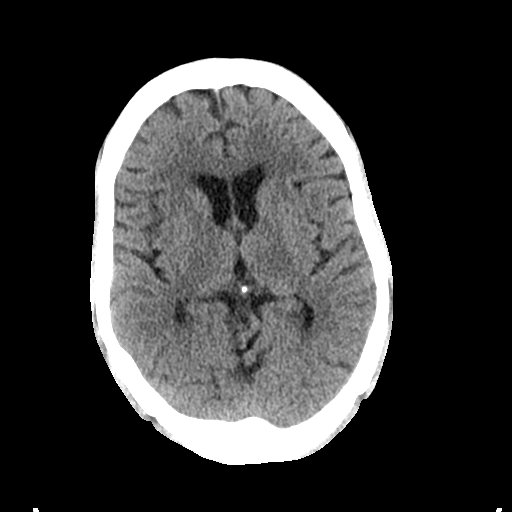
[im 15/33  bone]
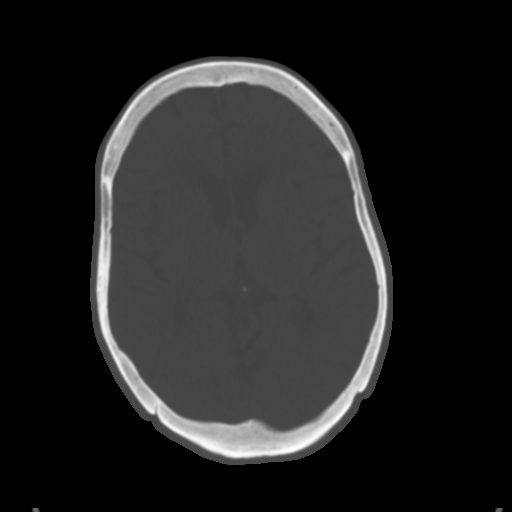
[im 18/33  brain]
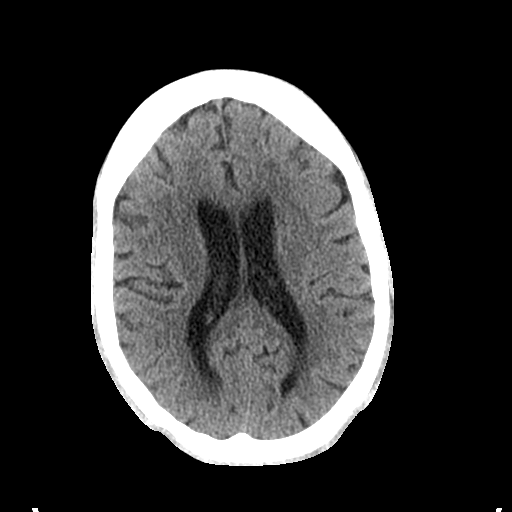
[im 21/33  brain]
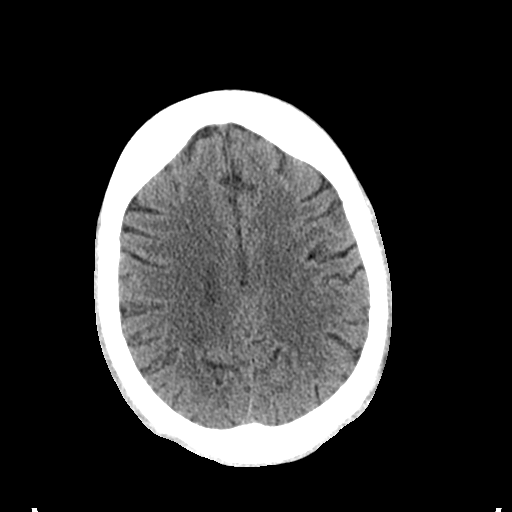
[im 25/33  brain]
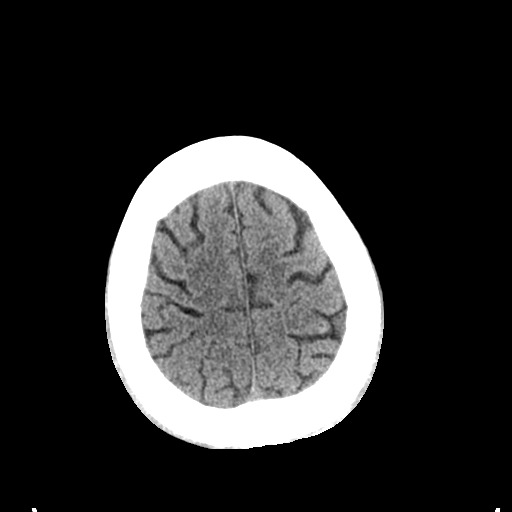
[im 27/33  brain]
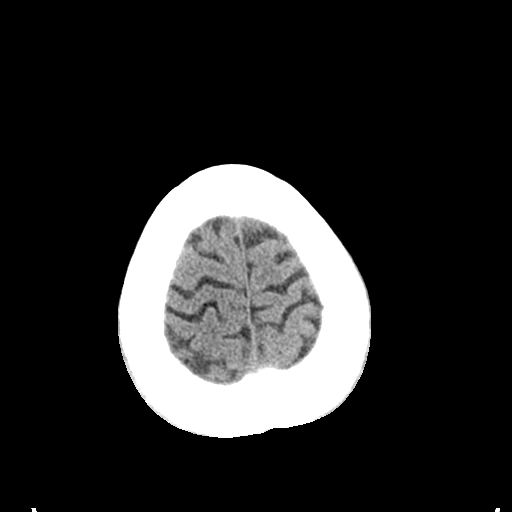
[im 27/33  bone]
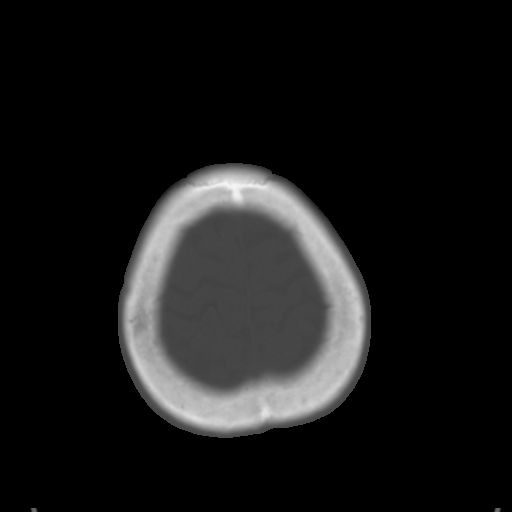
[im 30/33  brain]
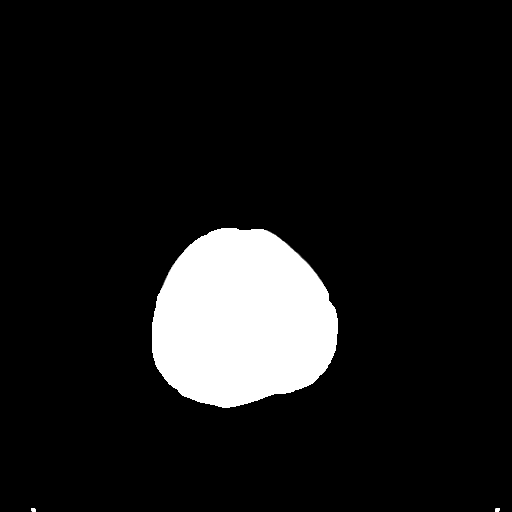

[Series 5: coronal soft tissue · coronal · 0.33mm/px · 3 of 69 slices shown]
[im 23/69  brain]
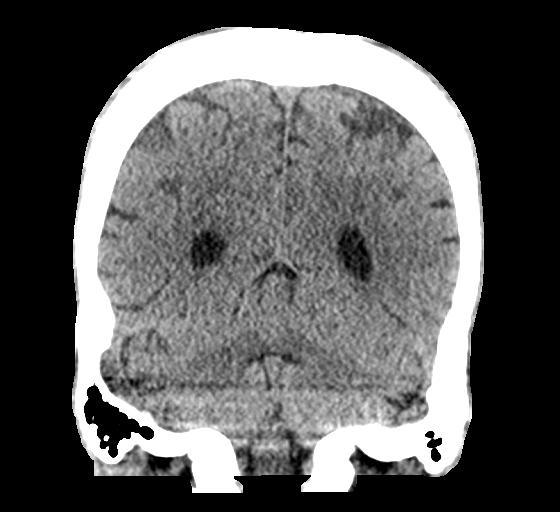
[im 31/69  brain]
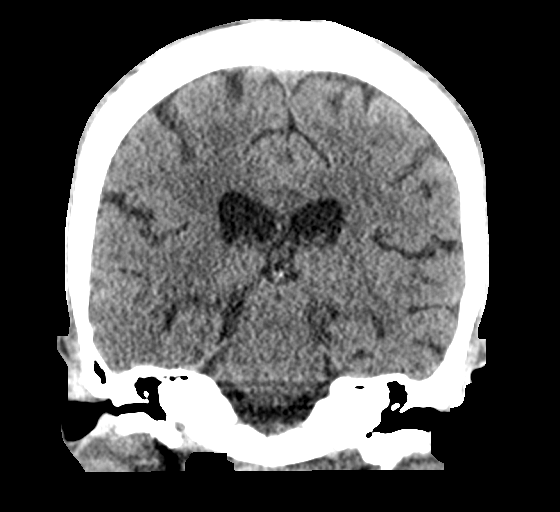
[im 38/69  brain]
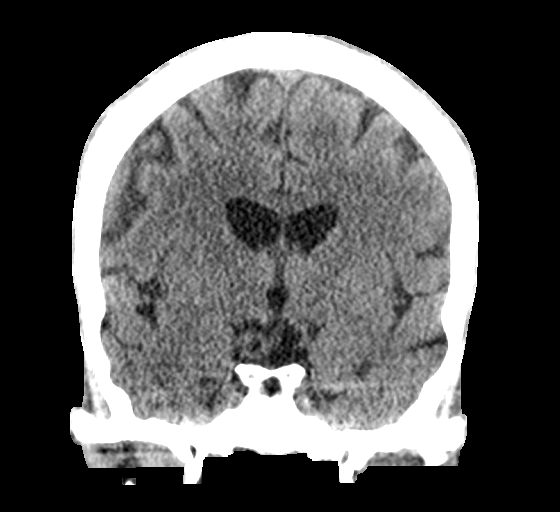

[Series 6: sagittal soft tissue · sagittal · 0.38mm/px · 3 of 57 slices shown]
[im 19/57  brain]
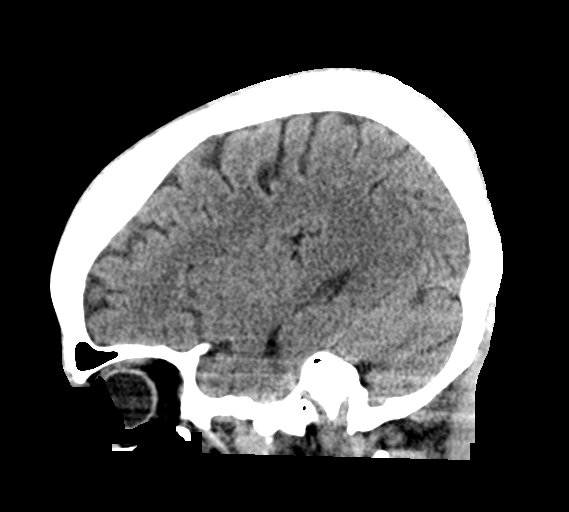
[im 29/57  brain]
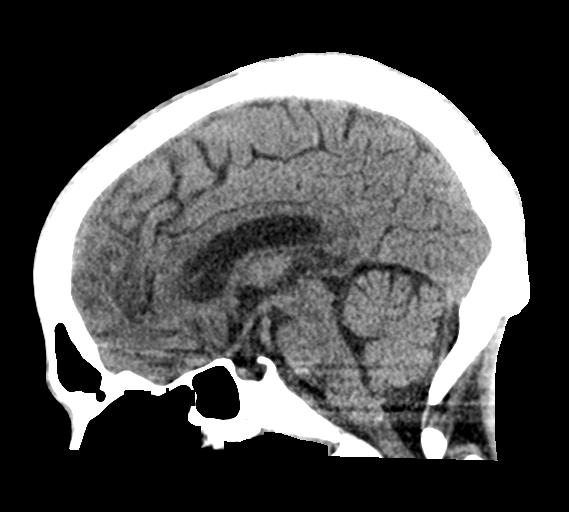
[im 38/57  brain]
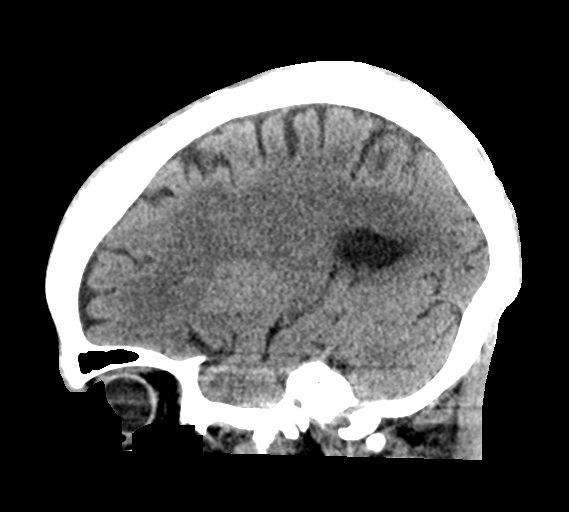

[16 of 47 positions shown; findings below may reference images not displayed]

FINDINGS: Brain: No evidence of acute infarction, hemorrhage, hydrocephalus,
extra-axial collection or mass lesion/mass effect.

Vascular: No hyperdense vessel or unexpected calcification.

Skull: Normal. Negative for fracture or focal lesion.

Sinuses/Orbits: Visualized paranasal sinuses and orbits show no
acute finding to the extent evaluated. Chronic deformity of the
medial LEFT orbit, lamina papyracea without change.

Other: None.
IMPRESSION: 1. No acute intracranial abnormality.

## 2021-05-10 IMAGING — CT CT CHEST-ABD-PELV W/ CM
2 of 5 series · 11 of 36 positions shown, 16 images · IV contrast (agent unspecified)
Comparison: CT abdomen and pelvis dated [DATE]

CLINICAL DATA: Multiple falls;  ventral hernia repair [DATE]

EXAM:
CT CHEST, ABDOMEN, AND PELVIS WITH CONTRAST
TECHNIQUE: Multidetector CT imaging of the chest, abdomen and pelvis was
performed following the standard protocol during bolus
administration of intravenous contrast.

[Series 2: cap with · axial · 0.90mm/px · z∈[+701,+1191]mm · 8 of 127 slices shown, 13 images]
[im 15/127  mediastinal]
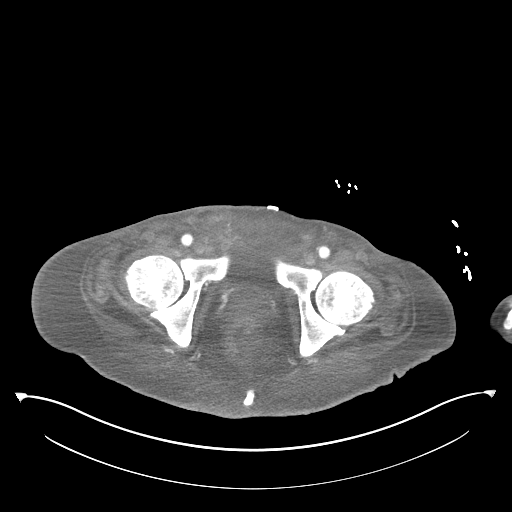
[im 15/127  bone]
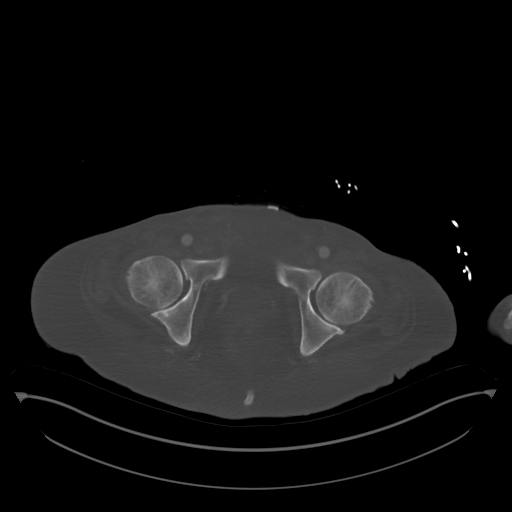
[im 29/127  mediastinal]
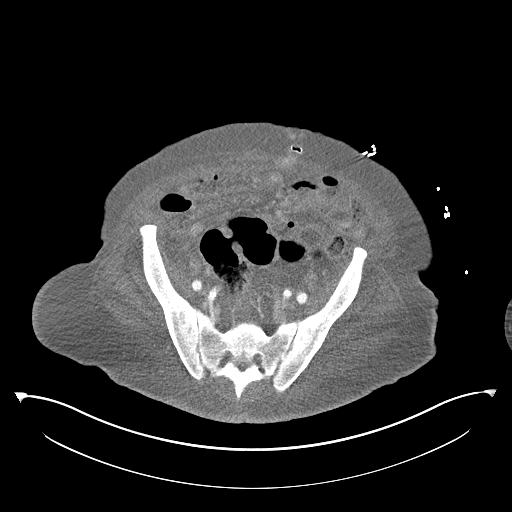
[im 43/127  mediastinal]
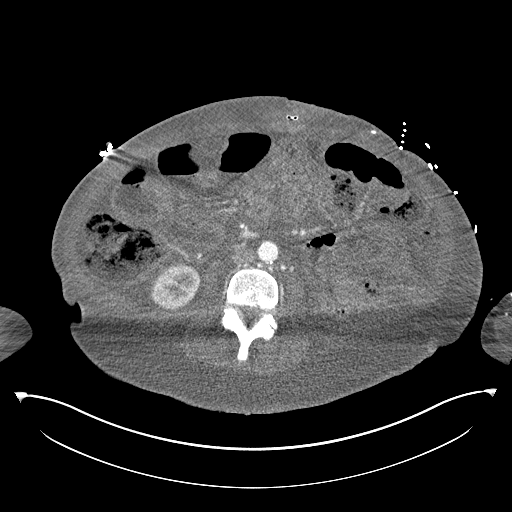
[im 57/127  mediastinal]
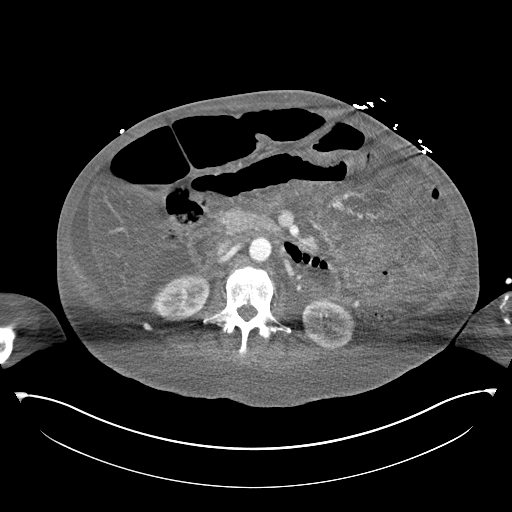
[im 71/127  mediastinal]
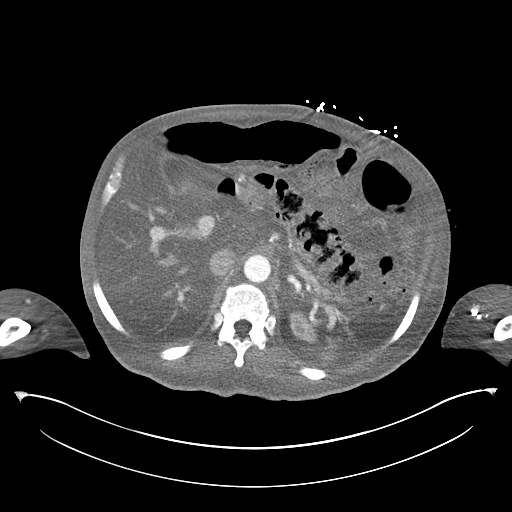
[im 71/127  lung]
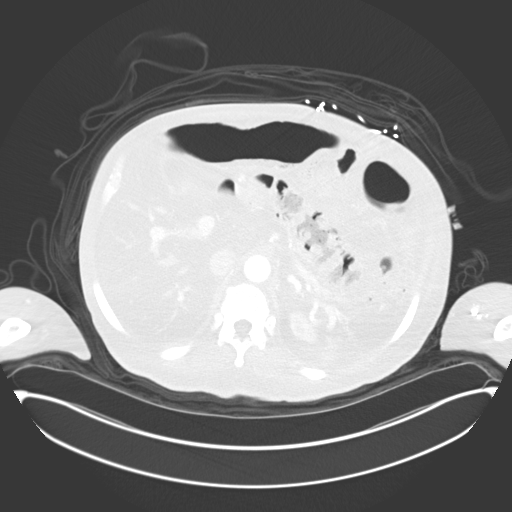
[im 85/127  mediastinal]
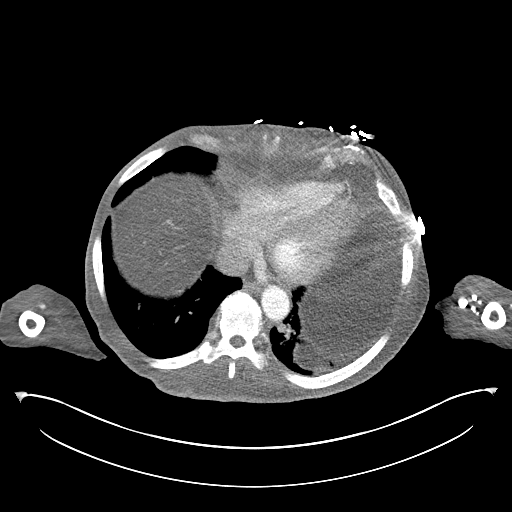
[im 85/127  lung]
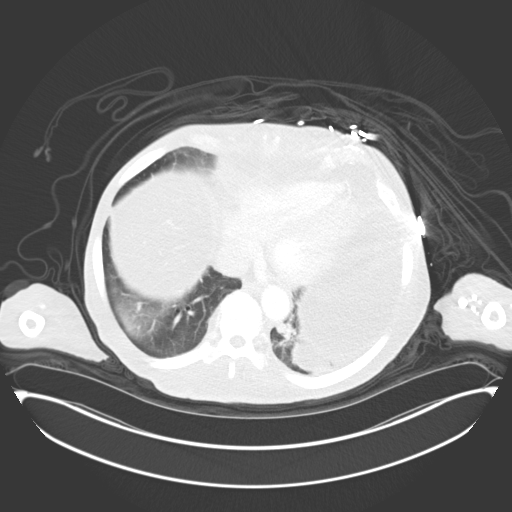
[im 99/127  mediastinal]
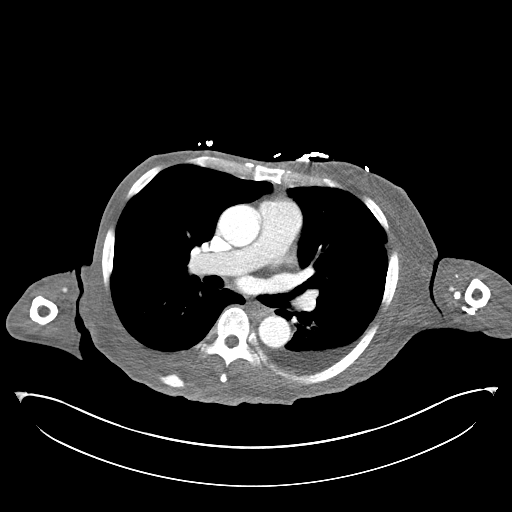
[im 99/127  lung]
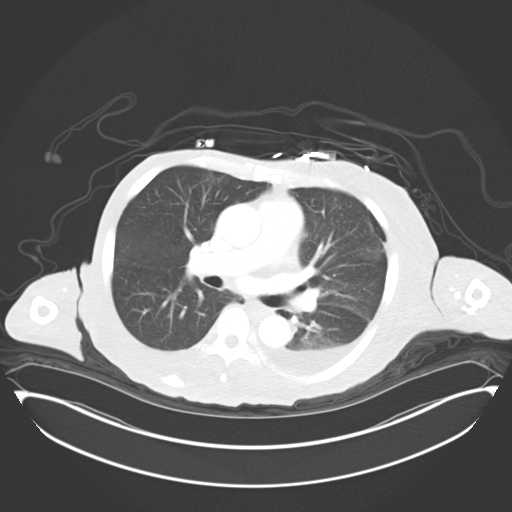
[im 113/127  mediastinal]
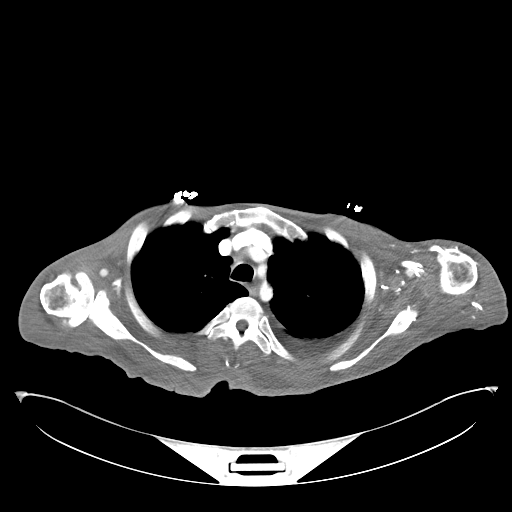
[im 113/127  lung]
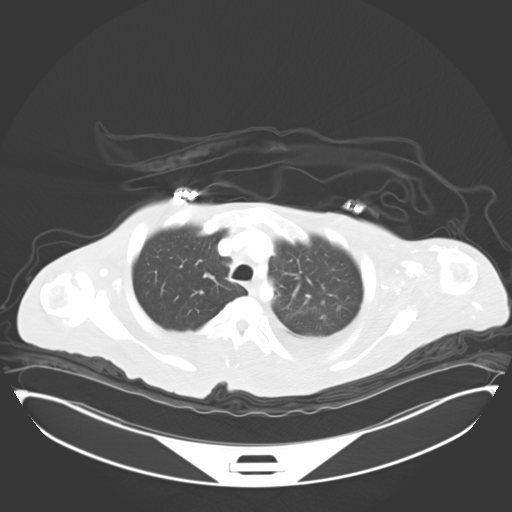

[Series 5: coronals · coronal · 0.89mm/px · 3 of 165 slices shown]
[im 33/165  mediastinal]
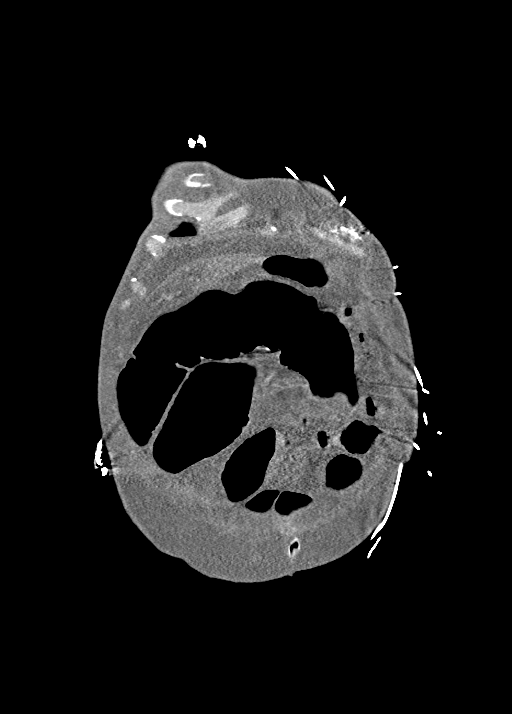
[im 66/165  mediastinal]
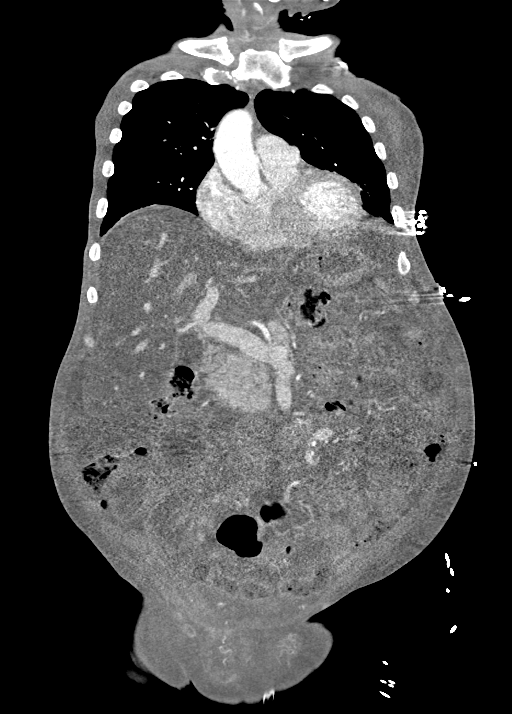
[im 99/165  mediastinal]
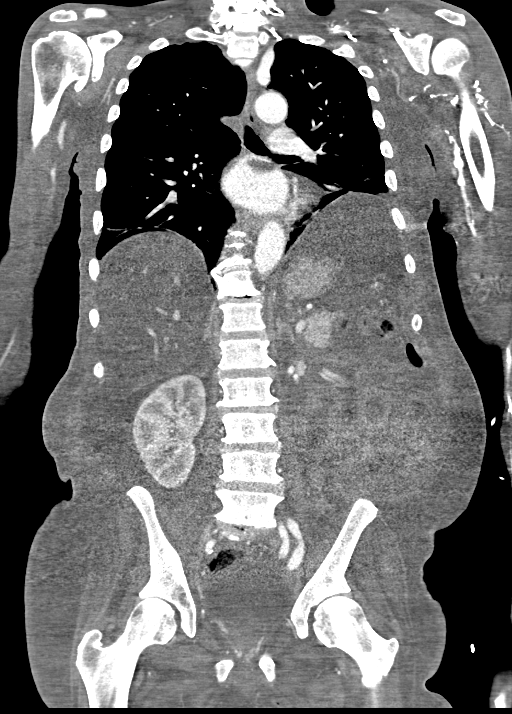

[11 of 36 positions shown; findings below may reference images not displayed]

RADIATION DOSE REDUCTION: This exam was performed according to the
departmental dose-optimization program which includes automated
exposure control, adjustment of the mA and/or kV according to
patient size and/or use of iterative reconstruction technique.

CONTRAST:  100mL OMNIPAQUE IOHEXOL 300 MG/ML  SOLN
FINDINGS: CT CHEST FINDINGS

Cardiovascular: Normal heart size. No pericardial effusion. No
significant atherosclerotic disease of the thoracic aorta. No
suspicious filling defects of the central pulmonary arteries.

Mediastinum/Nodes: Thyroid and esophagus are unremarkable. No
pathologically enlarged lymph nodes seen in the chest.

Lungs/Pleura: Central airways are patent. Mild paraseptal emphysema.
Linear opacities of the left greater than right lower lungs, likely
due to scarring or atelectasis. Small left Bochdalek hernia.

Musculoskeletal: No chest wall mass or suspicious bone lesions
identified.

CT ABDOMEN PELVIS FINDINGS

Hepatobiliary: Hepatic steatosis. No focal liver abnormalities.
Hydropic gallbladder, no gallbladder wall thickening. No biliary
ductal dilation.

Pancreas: Unremarkable. No pancreatic ductal dilatation or
surrounding inflammatory changes.

Spleen: Normal in size without focal abnormality.

Adrenals/Urinary Tract: Adrenal glands are unremarkable. Kidneys are
normal, without renal calculi, focal lesion, or hydronephrosis.
Bladder is unremarkable.

Stomach/Bowel: Bowel loops are not well visualized due severe
anasarca and streak artifact, within limitations, dilated gas-filled
loops of small bowel are seen. Large bowel is normal in caliber.

Vascular/Lymphatic: Aortic atherosclerosis. No enlarged abdominal or
pelvic lymph nodes.

Reproductive: Prostate is unremarkable.

Other: Severe diffuse soft tissue anasarca. Postsurgical changes of
ventral abdominal hernia repair with soft tissue drain is seen in
the anterior abdomen.

Musculoskeletal: No acute or significant osseous findings.
IMPRESSION: 1. Gas-filled dilated loops of small bowel are seen with no definite
transition point, evaluation of the bowel is somewhat limited due to
anasarca and streak artifact. Differential considerations include
ileus or early or partial small-bowel obstruction.
2. New small volume abdominal ascites.
3. Hydropic gallbladder with no gallbladder wall thickening, if
there are symptoms of right upper quadrant pain finding could be
further evaluated with gallbladder ultrasound.
4. Severe anasarca.
5. Hepatic steatosis.
6. Aortic Atherosclerosis ([XW]-[XW]).

## 2021-05-10 MED ORDER — MAGNESIUM OXIDE -MG SUPPLEMENT 400 (240 MG) MG PO TABS
800.0000 mg | ORAL_TABLET | Freq: Once | ORAL | Status: AC
  Administered 2021-05-10: 800 mg via ORAL

## 2021-05-10 MED ORDER — HYDROMORPHONE HCL 1 MG/ML IJ SOLN
0.5000 mg | Freq: Once | INTRAMUSCULAR | Status: AC
  Administered 2021-05-11: 0.5 mg via INTRAVENOUS

## 2021-05-10 MED ORDER — POTASSIUM CHLORIDE CRYS ER 20 MEQ PO TBCR
40.0000 meq | EXTENDED_RELEASE_TABLET | Freq: Once | ORAL | Status: AC
  Administered 2021-05-10: 40 meq via ORAL

## 2021-05-10 MED ORDER — IOHEXOL 300 MG/ML  SOLN
100.0000 mL | Freq: Once | INTRAMUSCULAR | Status: AC | PRN
  Administered 2021-05-10: 100 mL via INTRAVENOUS

## 2021-05-10 MED ORDER — POTASSIUM CHLORIDE CRYS ER 20 MEQ PO TBCR
40.0000 meq | EXTENDED_RELEASE_TABLET | Freq: Once | ORAL | Status: AC
Start: 2021-05-10 — End: 2021-05-10
  Administered 2021-05-10: 40 meq via ORAL

## 2021-05-10 MED ORDER — GADOBUTROL 1 MMOL/ML IV SOLN
6.5000 mL | Freq: Once | INTRAVENOUS | Status: AC | PRN
  Administered 2021-05-10: 6.5 mL via INTRAVENOUS

## 2021-05-10 MED ORDER — DIAZEPAM 5 MG/ML IJ SOLN
5.0000 mg | Freq: Once | INTRAMUSCULAR | Status: AC | PRN
  Administered 2021-05-11: 5 mg via INTRAVENOUS

## 2021-05-10 MED ORDER — FUROSEMIDE 10 MG/ML IJ SOLN
20.0000 mg | Freq: Once | INTRAMUSCULAR | Status: AC
  Administered 2021-05-10: 20 mg via INTRAVENOUS

## 2021-05-10 NOTE — ED Notes (Signed)
Patient to MRI at this time.

## 2021-05-10 NOTE — ED Notes (Signed)
Pt stated he needed a condom catheter, condom cath applied. Also stated that he could not move his arm enough for an MSE signature. ?

## 2021-05-10 NOTE — ED Provider Notes (Signed)
?[facility] ?Provider Note ? ? ?CSN: [id number] ?Arrival date & time: 05/10/21  1625 ? ?  ? ?History ? ?Chief Complaint  ?Patient presents with  ? Leg Swelling  ? ? ?Logan Nelson is a 56 y.o. male. ? ?56 yo M with a chief complaint of to the admitting team last time when he was here had missed something.  He tells me that he has not really been able to use the right side of his body well since he was hospitalized.  He was worked up for a couple days in the hospital and left [place].  He has followed in at least once every day since he is left.  He feels like the weakness is gotten a little bit worse.  He denies any specific injury from the fall.  He has been able to eat and drink without issue.  Denies fevers. ? ?Per EMS the patient has been very poorly cared for.  Lives with someone in their 41s.  He typically falls and then can get up and then has to request out from EMS to get back into his position.  He was able to ambulate with them with a walker. ? ? ? ?  ? ?Home Medications ?Prior to Admission medications   ?Medication Sig Start Date End Date Taking? Authorizing Provider  ?aspirin 81 MG chewable tablet Chew 1 tablet (81 mg total) by mouth daily. ?Patient not taking: Reported on 04/23/2021 04/14/21 05/14/21  Lacinda Axon, MD  ?feeding supplement (ENSURE ENLIVE / ENSURE PLUS) LIQD Take 237 mLs by mouth 3 (three) times daily between meals. ?Patient not taking: Reported on 04/23/2021 04/14/21   Lacinda Axon, MD  ?Multiple Vitamins-Minerals (CERTAVITE/ANTIOXIDANTS) TABS Take 1 tablet by mouth daily. ?Patient not taking: Reported on 04/23/2021 04/14/21 05/14/21  Lacinda Axon, MD  ?ondansetron (ZOFRAN) 4 MG tablet Take 1 tablet (4 mg total) by mouth every 8 (eight) hours as needed for nausea or vomiting. ?Patient not taking: Reported on 04/23/2021 04/14/21 04/14/22  Lacinda Axon, MD  ?Vitamin D, Ergocalciferol, (DRISDOL) 1.25 MG (50000 UNIT) CAPS  capsule Take 1 capsule (50,000 Units total) by mouth every 7 (seven) days. ?Patient not taking: Reported on 04/23/2021 04/20/21 05/20/21  Lacinda Axon, MD  ?   ? ?Allergies    ?Patient has no known allergies.   ? ?Review of Systems   ?Review of Systems ? ?Physical Exam ?Updated Vital Signs ?BP 123/87 (BP Location: Right Arm)   Pulse 79   Temp 97.6 ?F (36.4 ?C) (Oral)   Resp 18   SpO2 100%  ?Physical Exam ?Vitals and nursing note reviewed.  ?Constitutional:   ?   Appearance: He is well-developed.  ?HENT:  ?   Head: Normocephalic and atraumatic.  ?Eyes:  ?   Pupils: Pupils are equal, round, and reactive to light.  ?Neck:  ?   Vascular: No JVD.  ?Cardiovascular:  ?   Rate and Rhythm: Normal rate and regular rhythm.  ?   Heart sounds: No murmur heard. ?  No friction rub. No gallop.  ?Pulmonary:  ?   Effort: No respiratory distress.  ?   Breath sounds: No wheezing.  ?Abdominal:  ?   General: There is no distension.  ?   Tenderness: There is no abdominal tenderness. There is no guarding or rebound.  ?Musculoskeletal:     ?   General: Normal range of motion.  ?   Cervical back: Normal range of motion  and neck supple.  ?   Comments: Decreased grip strength to the right upper extremity.  Intact shoulder shrug.  Mild weakness with flexion extension of the arm.  Tells me he cannot lift his right leg up off the bed at all.  Possibly limited to effort.  He also cannot significantly lift his left leg up off the bed either.  No clonus.  Reflexes diminished or possibly absent bilaterally significant lower extremity edema bilaterally up to the abdomen.  ?Skin: ?   Coloration: Skin is not pale.  ?   Findings: No rash.  ?Neurological:  ?   Mental Status: He is alert and oriented to person, place, and time.  ?Psychiatric:     ?   Behavior: Behavior normal.  ? ? ?ED Results / Procedures / Treatments   ?Labs ?(all labs ordered are listed, but only abnormal results are displayed) ?Labs Reviewed  ?CBC WITH DIFFERENTIAL/PLATELET -  Abnormal; Notable for the following components:  ?    Result Value  ? RBC 3.81 (*)   ? Hemoglobin 11.5 (*)   ? HCT 35.8 (*)   ? RDW 16.0 (*)   ? All other components within normal limits  ?COMPREHENSIVE METABOLIC PANEL - Abnormal; Notable for the following components:  ? Potassium 2.9 (*)   ? Glucose, Bld 104 (*)   ? Creatinine, Ser 0.58 (*)   ? Calcium 7.3 (*)   ? Total Protein 5.4 (*)   ? Albumin 2.0 (*)   ? AST 58 (*)   ? ALT 91 (*)   ? Alkaline Phosphatase 166 (*)   ? All other components within normal limits  ?I-STAT CHEM 8, ED - Abnormal; Notable for the following components:  ? Potassium 3.0 (*)   ? Creatinine, Ser 0.50 (*)   ? Glucose, Bld 100 (*)   ? Calcium, Ion 1.10 (*)   ? Hemoglobin 12.2 (*)   ? HCT 36.0 (*)   ? All other components within normal limits  ?LIPASE, BLOOD  ?PROTIME-INR  ?AMMONIA  ?URINALYSIS, ROUTINE W REFLEX MICROSCOPIC  ?ETHANOL  ?MAGNESIUM  ?VITAMIN B1  ? ? ?EKG ?None ? ?Radiology ?CT Head Wo Contrast ? ?Result Date: 05/10/2021 ?CLINICAL DATA:  Head trauma, multiple falls. EXAM: CT HEAD WITHOUT CONTRAST TECHNIQUE: Contiguous axial images were obtained from the base of the skull through the vertex without intravenous contrast. RADIATION DOSE REDUCTION: This exam was performed according to the departmental dose-optimization program which includes automated exposure control, adjustment of the mA and/or kV according to patient size and/or use of iterative reconstruction technique. COMPARISON:  CT imaging from March 5th 2023 MRI of the brain also from that same date. FINDINGS: Brain: No evidence of acute infarction, hemorrhage, hydrocephalus, extra-axial collection or mass lesion/mass effect. Vascular: No hyperdense vessel or unexpected calcification. Skull: Normal. Negative for fracture or focal lesion. Sinuses/Orbits: Visualized paranasal sinuses and orbits show no acute finding to the extent evaluated. Chronic deformity of the medial LEFT orbit, lamina papyracea without change. Other: None.  IMPRESSION: 1. No acute intracranial abnormality. Electronically Signed   By: Zetta Bills M.D.   On: 05/10/2021 17:53  ? ?MR BRAIN WO CONTRAST ? ?Result Date: 05/10/2021 ?CLINICAL DATA:  Acute neurologic deficit EXAM: MRI HEAD WITHOUT CONTRAST TECHNIQUE: Multiplanar, multiecho pulse sequences of the brain and surrounding structures were obtained without intravenous contrast. COMPARISON:  04/11/2021 FINDINGS: Brain: No acute infarct, mass effect or extra-axial collection. No acute or chronic hemorrhage. There is multifocal hyperintense T2-weighted signal within the white matter.  Generalized volume loss without a clear lobar predilection. The midline structures are normal. Vascular: Major flow voids are preserved. Skull and upper cervical spine: Normal calvarium and skull base. Visualized upper cervical spine and soft tissues are normal. Sinuses/Orbits:No paranasal sinus fluid levels or advanced mucosal thickening. No mastoid or middle ear effusion. Normal orbits. IMPRESSION: 1. No acute intracranial abnormality. 2. Findings of chronic microvascular ischemia and generalized volume loss. Electronically Signed   By: Ulyses Jarred M.D.   On: 05/10/2021 22:59  ? ?MR CERVICAL SPINE WO CONTRAST ? ?Result Date: 05/10/2021 ?CLINICAL DATA:  Neck trauma, focal neuro deficit or paresthesia (Age 31-64y). EXAM: MRI CERVICAL SPINE WITHOUT CONTRAST TECHNIQUE: Multiplanar, multisequence MR imaging of the cervical spine was performed. No intravenous contrast was administered. COMPARISON:  04/11/2021 FINDINGS: The examination is severely degraded by motion. Visualization of the spinal cord is 4. C6-7 degenerative endplate changes are unchanged. There is no high-grade spinal canal stenosis. IMPRESSION: 1. Severely motion degraded examination. 2. No high-grade spinal canal stenosis. Electronically Signed   By: Ulyses Jarred M.D.   On: 05/10/2021 22:25  ? ?CT CHEST ABDOMEN PELVIS W CONTRAST ? ?Result Date: 05/10/2021 ?CLINICAL DATA:   Multiple falls;  ventral hernia repair 04/30/2021 EXAM: CT CHEST, ABDOMEN, AND PELVIS WITH CONTRAST TECHNIQUE: Multidetector CT imaging of the chest, abdomen and pelvis was performed following the standard protocol

## 2021-05-10 NOTE — ED Triage Notes (Signed)
Pt here d/t multiple falls.  Pt reports to EMS he feels weaker since the missed stroke he had.  Unclear when missed stroke was. Pt has swelling to b/l legs.  Per EMS first responders have been out to his home daily to assist pt off of floor since 05/06/21. ?

## 2021-05-11 ENCOUNTER — Emergency Department (HOSPITAL_COMMUNITY): Payer: Medicaid Other

## 2021-05-11 ENCOUNTER — Observation Stay (HOSPITAL_COMMUNITY): Payer: Medicaid Other

## 2021-05-11 DIAGNOSIS — I110 Hypertensive heart disease with heart failure: Secondary | ICD-10-CM | POA: Diagnosis not present

## 2021-05-11 DIAGNOSIS — D638 Anemia in other chronic diseases classified elsewhere: Secondary | ICD-10-CM | POA: Diagnosis present

## 2021-05-11 DIAGNOSIS — E876 Hypokalemia: Secondary | ICD-10-CM | POA: Diagnosis not present

## 2021-05-11 DIAGNOSIS — R531 Weakness: Secondary | ICD-10-CM

## 2021-05-11 DIAGNOSIS — K219 Gastro-esophageal reflux disease without esophagitis: Secondary | ICD-10-CM | POA: Diagnosis not present

## 2021-05-11 DIAGNOSIS — R64 Cachexia: Secondary | ICD-10-CM | POA: Diagnosis not present

## 2021-05-11 DIAGNOSIS — Z7982 Long term (current) use of aspirin: Secondary | ICD-10-CM | POA: Diagnosis not present

## 2021-05-11 DIAGNOSIS — R29898 Other symptoms and signs involving the musculoskeletal system: Secondary | ICD-10-CM

## 2021-05-11 DIAGNOSIS — Z5329 Procedure and treatment not carried out because of patient's decision for other reasons: Secondary | ICD-10-CM | POA: Diagnosis not present

## 2021-05-11 DIAGNOSIS — E8809 Other disorders of plasma-protein metabolism, not elsewhere classified: Secondary | ICD-10-CM | POA: Diagnosis not present

## 2021-05-11 DIAGNOSIS — I471 Supraventricular tachycardia: Secondary | ICD-10-CM | POA: Diagnosis not present

## 2021-05-11 DIAGNOSIS — K760 Fatty (change of) liver, not elsewhere classified: Secondary | ICD-10-CM | POA: Diagnosis not present

## 2021-05-11 DIAGNOSIS — R601 Generalized edema: Secondary | ICD-10-CM | POA: Diagnosis present

## 2021-05-11 DIAGNOSIS — E872 Acidosis, unspecified: Secondary | ICD-10-CM | POA: Diagnosis not present

## 2021-05-11 DIAGNOSIS — M1990 Unspecified osteoarthritis, unspecified site: Secondary | ICD-10-CM | POA: Diagnosis not present

## 2021-05-11 DIAGNOSIS — I340 Nonrheumatic mitral (valve) insufficiency: Secondary | ICD-10-CM | POA: Diagnosis not present

## 2021-05-11 DIAGNOSIS — E43 Unspecified severe protein-calorie malnutrition: Secondary | ICD-10-CM | POA: Diagnosis not present

## 2021-05-11 DIAGNOSIS — I5041 Acute combined systolic (congestive) and diastolic (congestive) heart failure: Secondary | ICD-10-CM | POA: Diagnosis not present

## 2021-05-11 DIAGNOSIS — K567 Ileus, unspecified: Secondary | ICD-10-CM | POA: Diagnosis not present

## 2021-05-11 DIAGNOSIS — L89322 Pressure ulcer of left buttock, stage 2: Secondary | ICD-10-CM | POA: Diagnosis not present

## 2021-05-11 DIAGNOSIS — Z79899 Other long term (current) drug therapy: Secondary | ICD-10-CM | POA: Diagnosis not present

## 2021-05-11 DIAGNOSIS — A4101 Sepsis due to Methicillin susceptible Staphylococcus aureus: Secondary | ICD-10-CM | POA: Diagnosis not present

## 2021-05-11 DIAGNOSIS — Z6822 Body mass index (BMI) 22.0-22.9, adult: Secondary | ICD-10-CM | POA: Diagnosis not present

## 2021-05-11 DIAGNOSIS — L89153 Pressure ulcer of sacral region, stage 3: Secondary | ICD-10-CM | POA: Diagnosis not present

## 2021-05-11 LAB — CBC WITH DIFFERENTIAL/PLATELET
Abs Immature Granulocytes: 0.03 10*3/uL (ref 0.00–0.07)
Basophils Absolute: 0 10*3/uL (ref 0.0–0.1)
Basophils Relative: 0 %
Eosinophils Absolute: 0 10*3/uL (ref 0.0–0.5)
Eosinophils Relative: 0 %
HCT: 32.4 % — ABNORMAL LOW (ref 39.0–52.0)
Hemoglobin: 10.7 g/dL — ABNORMAL LOW (ref 13.0–17.0)
Immature Granulocytes: 0 %
Lymphocytes Relative: 14 %
Lymphs Abs: 1.2 10*3/uL (ref 0.7–4.0)
MCH: 31.3 pg (ref 26.0–34.0)
MCHC: 33 g/dL (ref 30.0–36.0)
MCV: 94.7 fL (ref 80.0–100.0)
Monocytes Absolute: 0.4 10*3/uL (ref 0.1–1.0)
Monocytes Relative: 5 %
Neutro Abs: 6.9 10*3/uL (ref 1.7–7.7)
Neutrophils Relative %: 81 %
Platelets: 385 10*3/uL (ref 150–400)
RBC: 3.42 MIL/uL — ABNORMAL LOW (ref 4.22–5.81)
RDW: 15.9 % — ABNORMAL HIGH (ref 11.5–15.5)
WBC: 8.6 10*3/uL (ref 4.0–10.5)
nRBC: 0 % (ref 0.0–0.2)

## 2021-05-11 LAB — COMPREHENSIVE METABOLIC PANEL
ALT: 70 U/L — ABNORMAL HIGH (ref 0–44)
AST: 47 U/L — ABNORMAL HIGH (ref 15–41)
Albumin: 1.8 g/dL — ABNORMAL LOW (ref 3.5–5.0)
Alkaline Phosphatase: 153 U/L — ABNORMAL HIGH (ref 38–126)
Anion gap: 3 — ABNORMAL LOW (ref 5–15)
BUN: 16 mg/dL (ref 6–20)
CO2: 28 mmol/L (ref 22–32)
Calcium: 7.4 mg/dL — ABNORMAL LOW (ref 8.9–10.3)
Chloride: 114 mmol/L — ABNORMAL HIGH (ref 98–111)
Creatinine, Ser: 0.53 mg/dL — ABNORMAL LOW (ref 0.61–1.24)
GFR, Estimated: >60 mL/min (ref >60)
Glucose, Bld: 92 mg/dL (ref 70–99)
Potassium: 3.4 mmol/L — ABNORMAL LOW (ref 3.5–5.1)
Sodium: 145 mmol/L (ref 135–145)
Total Bilirubin: 0.7 mg/dL (ref 0.3–1.2)
Total Protein: 5 g/dL — ABNORMAL LOW (ref 6.5–8.1)

## 2021-05-11 LAB — MAGNESIUM: Magnesium: 1.8 mg/dL (ref 1.7–2.4)

## 2021-05-11 LAB — PHOSPHORUS: Phosphorus: 2 mg/dL — ABNORMAL LOW (ref 2.5–4.6)

## 2021-05-11 IMAGING — DX DG ABDOMEN 1V
1 series · 2 of 2 positions shown · non-contrast
Comparison: CT [DATE]

CLINICAL DATA: Distended abdomen

EXAM:
ABDOMEN - 1 VIEW

[Series 1: abdomen · 0.14mm/px · 2 of 2 slices shown]
[im 1/2]
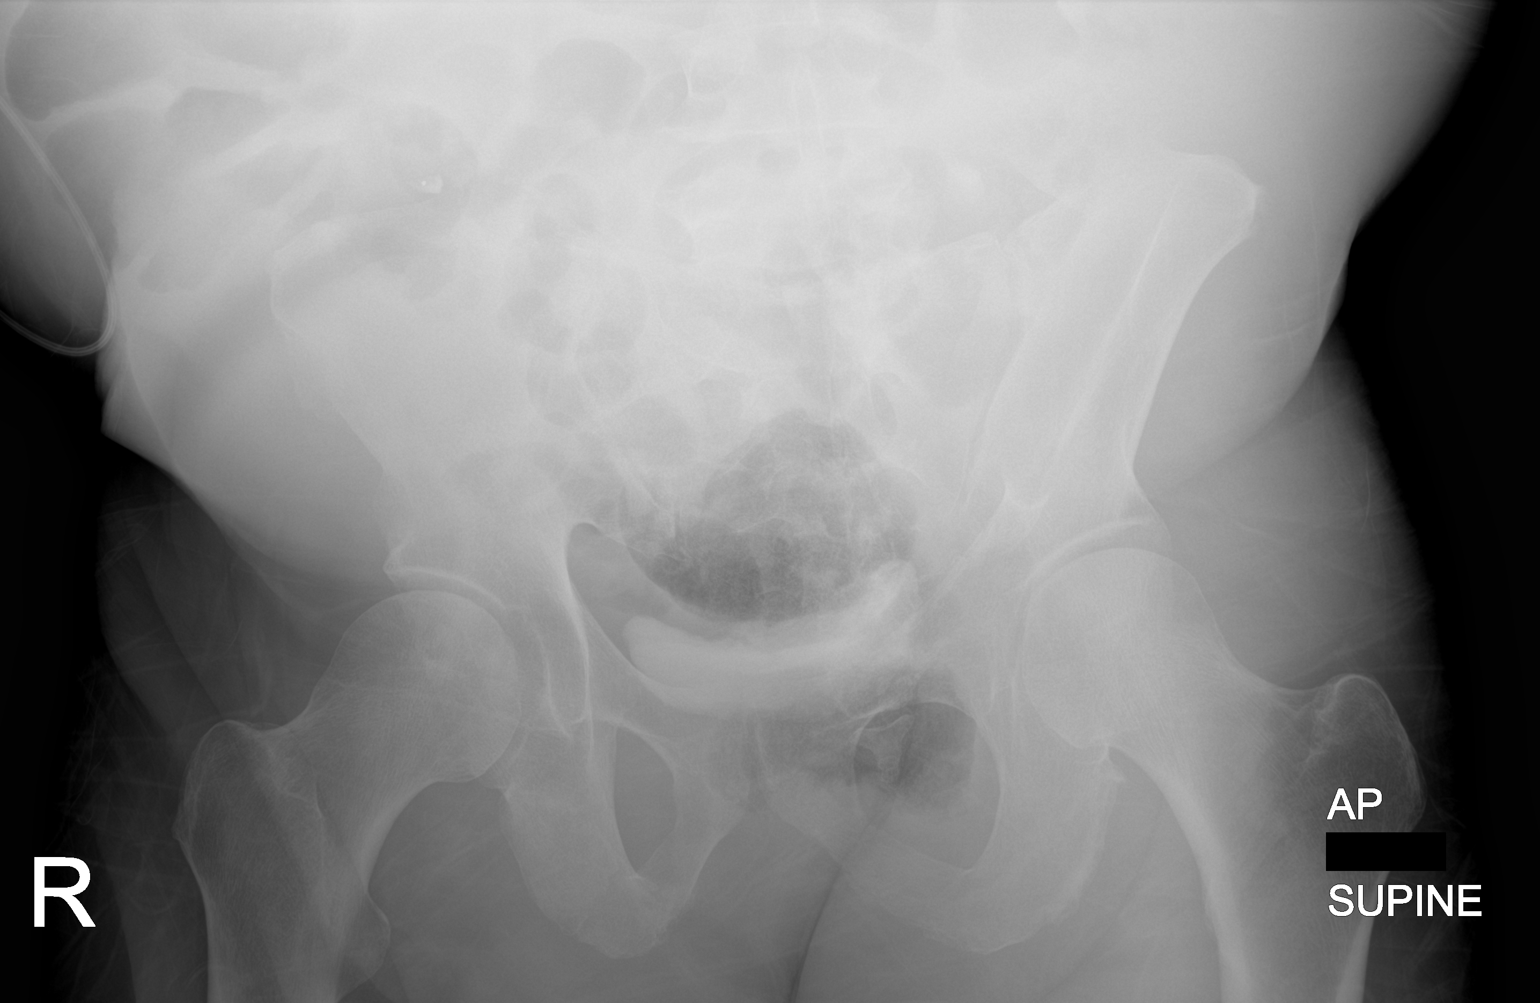
[im 2/2]
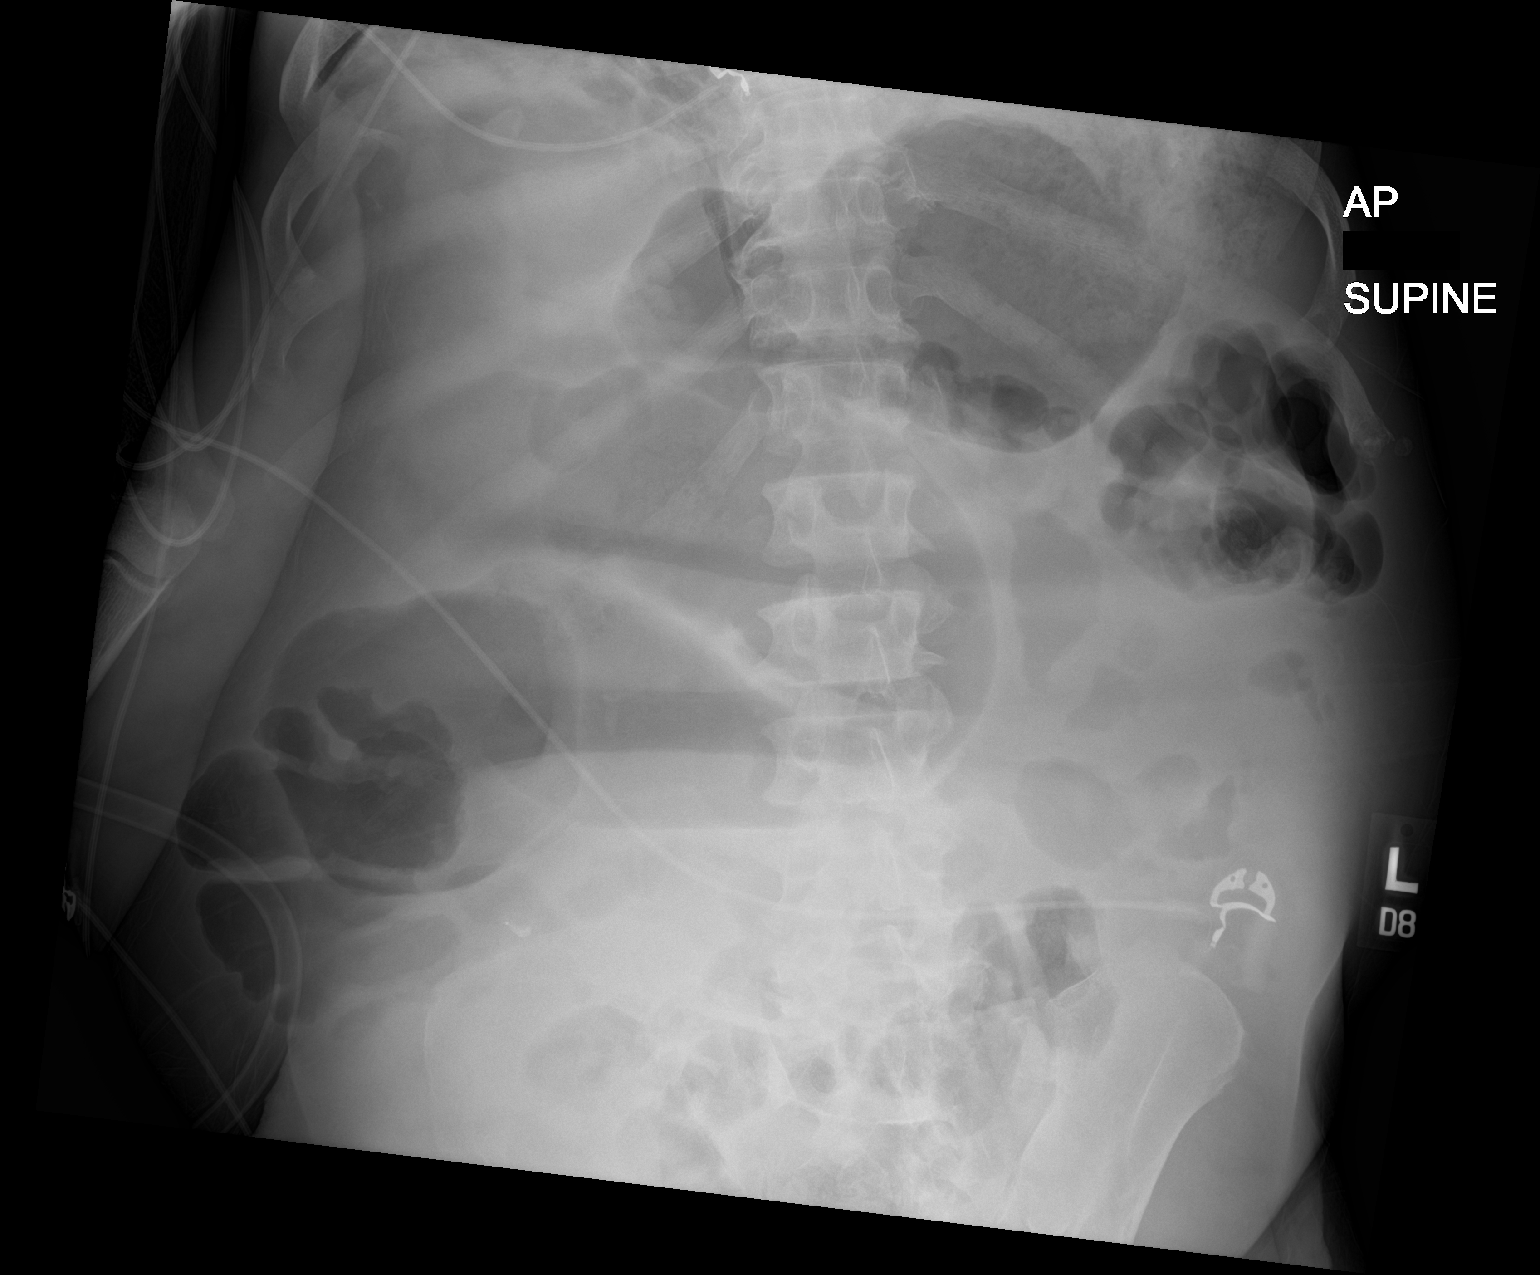

[2 of 2 positions shown; findings below may reference images not displayed]

FINDINGS: Dilated bowel loop in the central to right abdomen. When comparing
to prior CT, this appears to reflect a small bowel loop concerning
for small bowel obstruction. No organomegaly or free air. Gas seen
within nondistended colon.
IMPRESSION: Dilated small bowel loop in the right mid abdomen concerning for
small bowel obstruction. This is similar to prior CT.

## 2021-05-11 IMAGING — MR MR CERVICAL SPINE W/ CM
3 of 4 series · 28 of 48 positions shown · IV contrast (with contrast)
Comparison: Noncontrast cervical spine MRI dated 1 day prior,
cervical spine MRI [DATE]

CLINICAL DATA: Myelopathy, concern for epidural abscess

EXAM:
MRI CERVICAL SPINE WITH CONTRAST
TECHNIQUE: Multiplanar, multisequence MR imaging of the cervical spine was
performed following the administration of intravenous contrast.

[Series 17: T1 fat-sat post-contrast · sagittal · 3.0mm · 0.69mm/px · 9 of 15 slices shown]
[im 1/15]
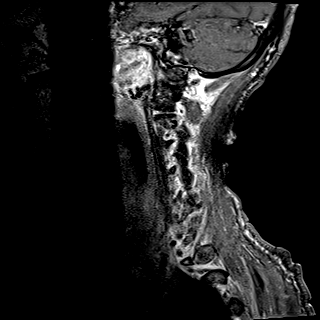
[im 2/15]
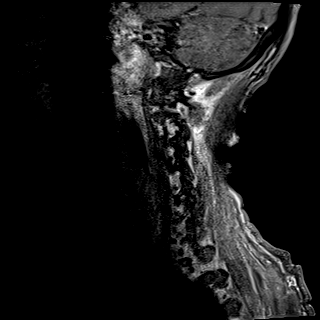
[im 4/15]
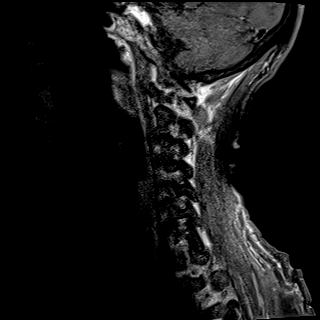
[im 6/15]
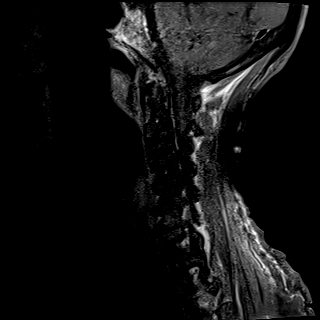
[im 8/15]
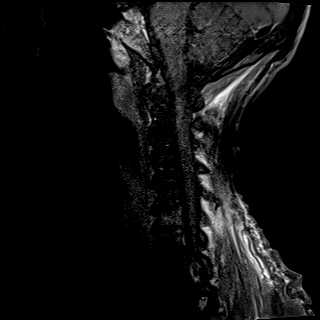
[im 9/15]
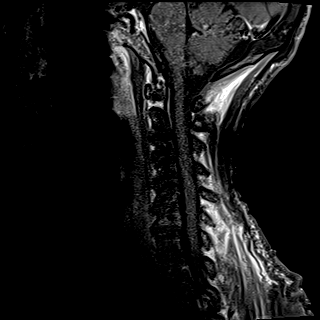
[im 11/15]
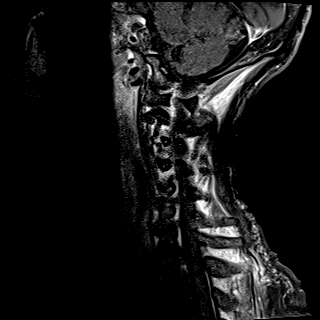
[im 13/15]
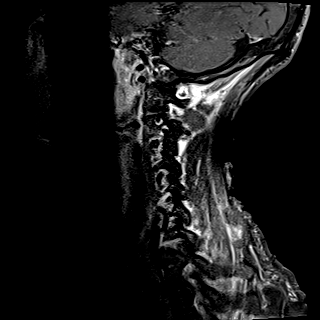
[im 15/15]
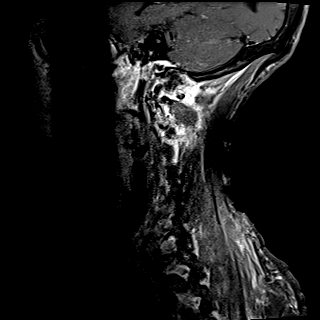

[Series 18: T1 post-contrast · axial · 3.0mm · 0.35mm/px · z∈[-3,+83]mm · 10 of 27 slices shown (1 of 2)]
[im 2/27]
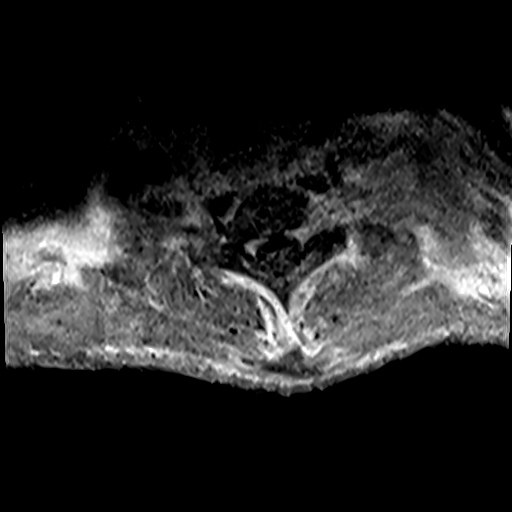
[im 4/27]
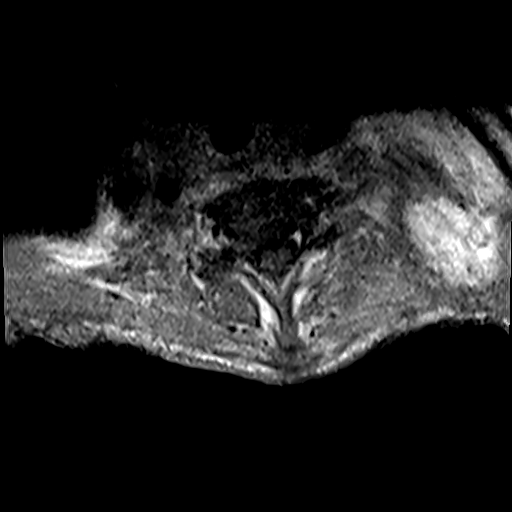
[im 6/27]
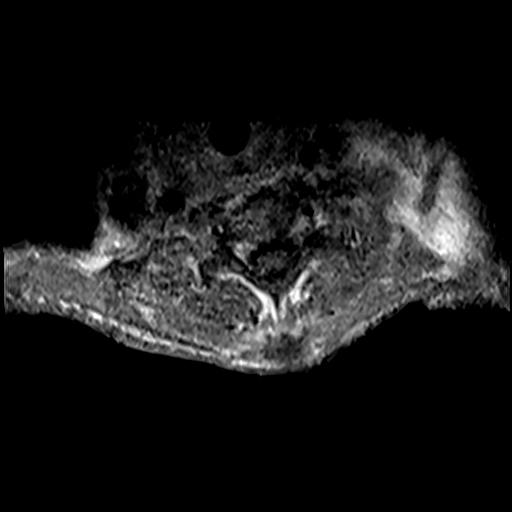
[im 9/27]
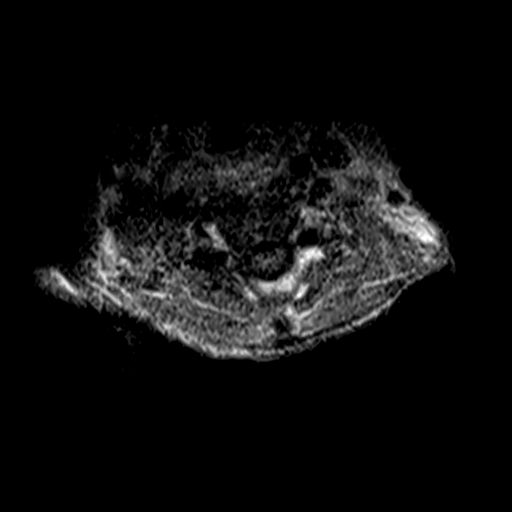
[im 13/27]
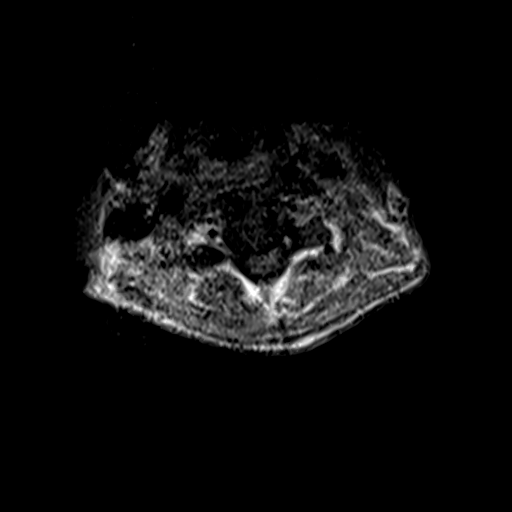
[im 14/27]
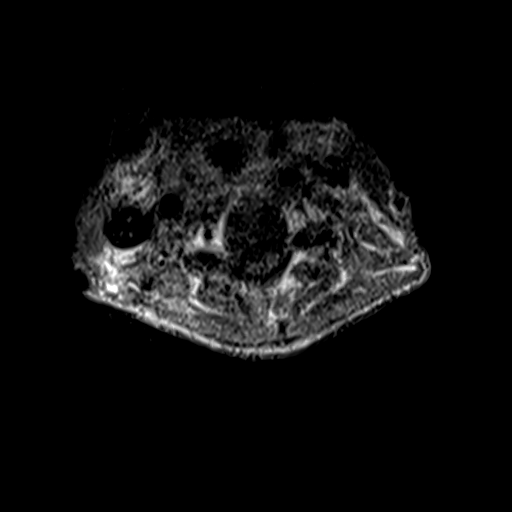
[im 16/27]
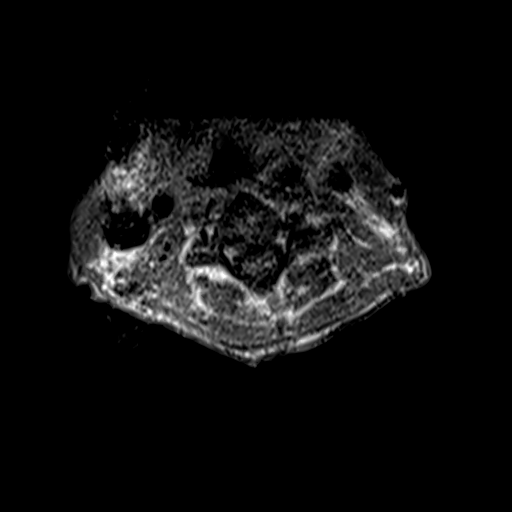
[im 20/27]
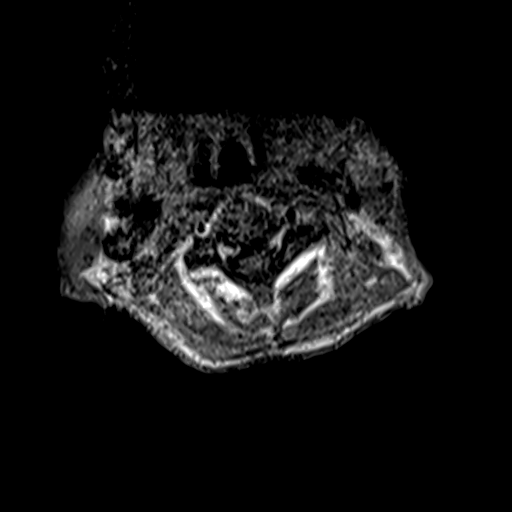
[im 23/27]
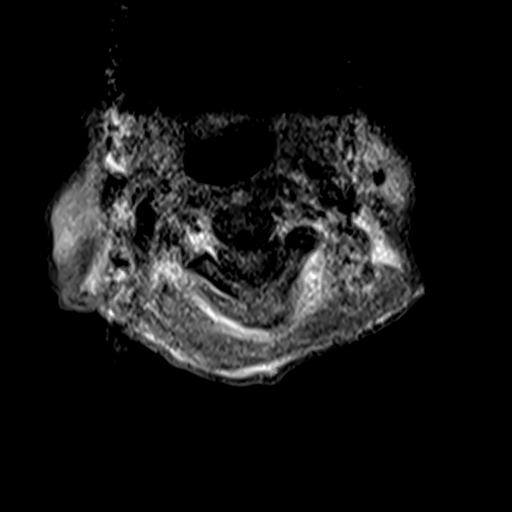
[im 27/27]
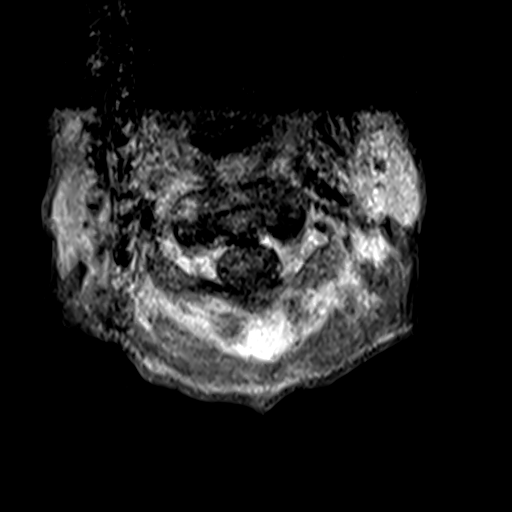

[Series 19: T1 post-contrast · axial · 3.0mm · 0.47mm/px · z∈[-7,+83]mm · 9 of 26 slices shown (2 of 2)]
[im 1/26]
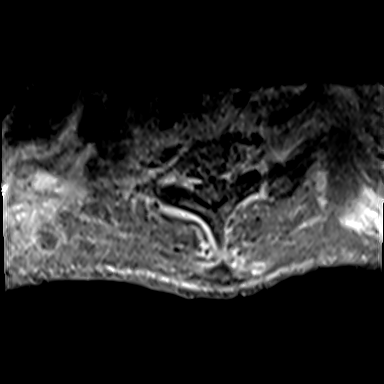
[im 4/26]
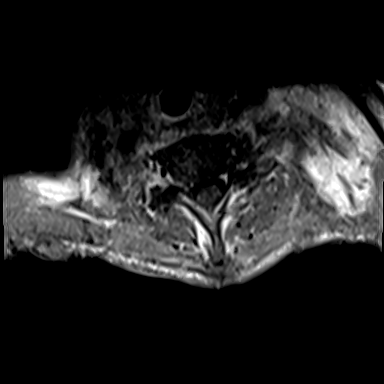
[im 8/26]
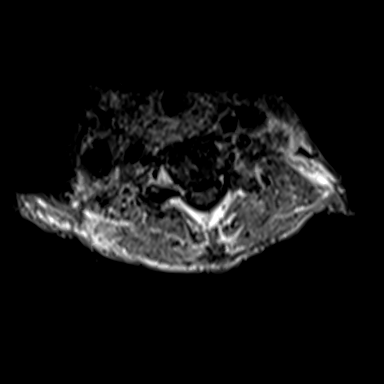
[im 11/26]
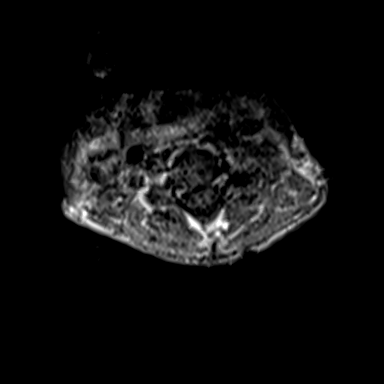
[im 13/26]
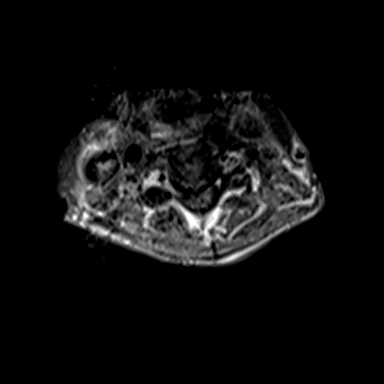
[im 15/26]
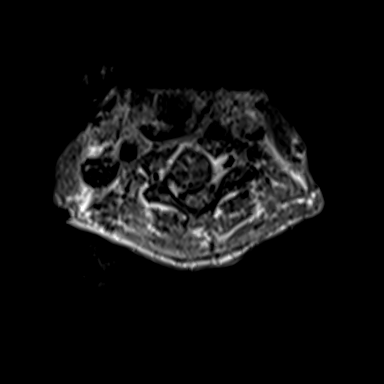
[im 18/26]
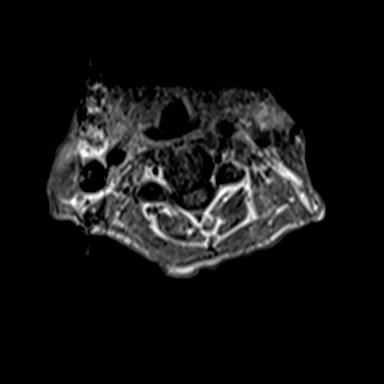
[im 22/26]
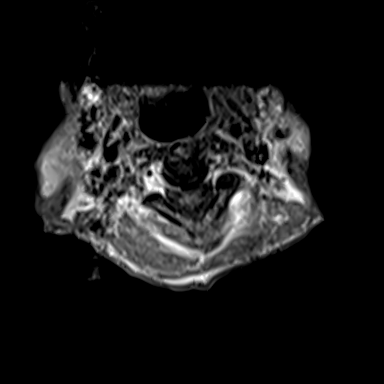
[im 26/26]
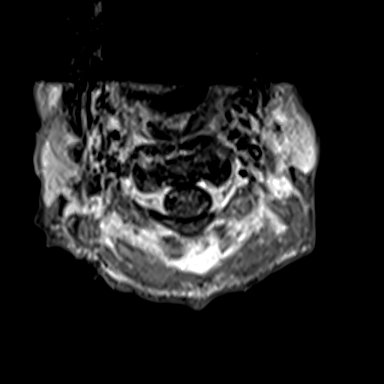

[28 of 48 positions shown; findings below may reference images not displayed]

FINDINGS: Image quality is degraded by motion artifact. Only axial and
sagittal T1 postcontrast images were provided.

Alignment: There is straightening of the normal cervical spine
lordosis. There is no antero or retrolisthesis.

Vertebrae: There is no suspicious enhancement. There is no evidence
of discitis/osteomyelitis. There is no evidence of epidural abscess.

Cord: There is no abnormal cord enhancement.

Posterior Fossa, vertebral arteries, paraspinal tissues: The imaged
posterior fossa is grossly unremarkable. There is enhancement in the
interspinous spaces throughout the cervical spine. There is no
evidence of abscess in the imaged soft tissues.

Disc levels:

There is no high-grade spinal canal stenosis.
IMPRESSION: 1. No suspicious enhancement or evidence of discitis/osteomyelitis
or epidural abscess.
2. Enhancement of the interspinous spaces throughout the cervical
spine is nonspecific but could be infectious or inflammatory in
nature.

## 2021-05-11 MED ORDER — GADOBUTROL 1 MMOL/ML IV SOLN
6.0000 mL | Freq: Once | INTRAVENOUS | Status: AC | PRN
  Administered 2021-05-11: 6 mL via INTRAVENOUS

## 2021-05-11 MED ORDER — POTASSIUM CHLORIDE CRYS ER 20 MEQ PO TBCR
40.0000 meq | EXTENDED_RELEASE_TABLET | Freq: Every day | ORAL | Status: DC
Start: 2021-05-11 — End: 2021-05-14
  Administered 2021-05-11 – 2021-05-13 (×3): 40 meq via ORAL
  Filled 2021-05-11 (×3): qty 2

## 2021-05-11 MED ORDER — LORAZEPAM 2 MG/ML IJ SOLN
1.0000 mg | Freq: Once | INTRAMUSCULAR | Status: DC
Start: 2021-05-11 — End: 2021-05-11

## 2021-05-11 MED ORDER — HYDROMORPHONE HCL 1 MG/ML IJ SOLN
0.5000 mg | Freq: Once | INTRAMUSCULAR | Status: AC
  Administered 2021-05-11: 0.5 mg via INTRAVENOUS
  Filled 2021-05-11: qty 0.5

## 2021-05-11 MED ORDER — IBUPROFEN 200 MG PO TABS
400.0000 mg | ORAL_TABLET | Freq: Four times a day (QID) | ORAL | Status: DC | PRN
  Administered 2021-05-11 – 2021-05-12 (×4): 400 mg via ORAL
  Filled 2021-05-11 (×3): qty 2

## 2021-05-11 NOTE — Progress Notes (Signed)
[facility] ?Progress Note ? ?   ?Subjective: ?CC-  ?Patient presented to the ED today with lower extremity weakness. He is being admitted to the medical service with neurology consult. ?Since he left the hospital after his surgery on 04/30/21 he still has his dressings in place. He has been tolerating a diet and having regular bowel movements. States that he has been eating a lot. Our office has been trying to contact him to let him know when his postop appointment is but he states his phone has been off. ? ?Objective: ?Vital signs in last 24 hours: ?Temp:  [97.6 ?F (36.4 ?C)] 97.6 ?F (36.4 ?C) (04/03 1640) ?Pulse Rate:  [55-106] 87 (04/04 0700) ?Resp:  [11-23] 12 (04/04 0700) ?BP: (123-152)/(87-106) 138/103 (04/04 0700) ?SpO2:  [92 %-100 %] 98 % (04/04 0700) ?  ? ?Intake/Output from previous day: ?04/03 0701 - 04/04 0700 ?In: -  ?Out: 1200 [Urine:1200] ?Intake/Output this shift: ?No intake/output data recorded. ? ?PE: ?Gen:  Alert, NAD, chronically malnourished  ?Pulm:  rate and effort normal ?Abd: soft, mild distension, nontender, right groin incision cdi, midline incision cdi with penrose drain present - this was removed and dry dressing applied ? ?Lab Results:  ?Recent Labs  ?  05/10/21 ?1656 05/10/21 ?1703  ?WBC 9.2  --   ?HGB 11.5* 12.2*  ?HCT 35.8* 36.0*  ?PLT 391  --   ? ?BMET ?Recent Labs  ?  05/10/21 ?1656 05/10/21 ?1703  ?NA 140 145  ?K 2.9* 3.0*  ?CL 110 106  ?CO2 25  --   ?GLUCOSE 104* 100*  ?BUN 16 19  ?CREATININE 0.58* 0.50*  ?CALCIUM 7.3*  --   ? ?PT/INR ?Recent Labs  ?  05/10/21 ?1656  ?LABPROT 14.7  ?INR 1.1  ? ?CMP  ?   ?Component Value Date/Time  ? NA 145 05/10/2021 1703  ? NA 143 12/08/2020 1103  ? K 3.0 (L) 05/10/2021 1703  ? CL 106 05/10/2021 1703  ? CO2 25 05/10/2021 1656  ? GLUCOSE 100 (H) 05/10/2021 1703  ? BUN 19 05/10/2021 1703  ? BUN 14 12/08/2020 1103  ? CREATININE 0.50 (L) 05/10/2021 1703  ? CALCIUM 7.3 (L) 05/10/2021 1656  ? PROT 5.4 (L) 05/10/2021 1656  ? PROT 6.7  12/08/2020 1103  ? ALBUMIN 2.0 (L) 05/10/2021 1656  ? ALBUMIN 3.8 12/08/2020 1103  ? AST 58 (H) 05/10/2021 1656  ? ALT 91 (H) 05/10/2021 1656  ? ALKPHOS 166 (H) 05/10/2021 1656  ? BILITOT 0.9 05/10/2021 1656  ? BILITOT 0.2 12/08/2020 1103  ? GFRNONAA >60 05/10/2021 1656  ? GFRAA >60 01/23/2019 1523  ? ?Lipase  ?   ?Component Value Date/Time  ? LIPASE 31 05/10/2021 1656  ? ? ? ? ? ?Studies/Results: ?CT Head Wo Contrast ? ?Result Date: 05/10/2021 ?CLINICAL DATA:  Head trauma, multiple falls. EXAM: CT HEAD WITHOUT CONTRAST TECHNIQUE: Contiguous axial images were obtained from the base of the skull through the vertex without intravenous contrast. RADIATION DOSE REDUCTION: This exam was performed according to the departmental dose-optimization program which includes automated exposure control, adjustment of the mA and/or kV according to patient size and/or use of iterative reconstruction technique. COMPARISON:  CT imaging from March 5th 2023 MRI of the brain also from that same date. FINDINGS: Brain: No evidence of acute infarction, hemorrhage, hydrocephalus, extra-axial collection or mass lesion/mass effect. Vascular: No hyperdense vessel or unexpected calcification. Skull: Normal. Negative for fracture or focal lesion. Sinuses/Orbits: Visualized paranasal sinuses and orbits show no acute finding  to the extent evaluated. Chronic deformity of the medial LEFT orbit, lamina papyracea without change. Other: None. IMPRESSION: 1. No acute intracranial abnormality. Electronically Signed   By: Zetta Bills M.D.   On: 05/10/2021 17:53  ? ?MR BRAIN WO CONTRAST ? ?Result Date: 05/10/2021 ?CLINICAL DATA:  Acute neurologic deficit EXAM: MRI HEAD WITHOUT CONTRAST TECHNIQUE: Multiplanar, multiecho pulse sequences of the brain and surrounding structures were obtained without intravenous contrast. COMPARISON:  04/11/2021 FINDINGS: Brain: No acute infarct, mass effect or extra-axial collection. No acute or chronic hemorrhage. There is  multifocal hyperintense T2-weighted signal within the white matter. Generalized volume loss without a clear lobar predilection. The midline structures are normal. Vascular: Major flow voids are preserved. Skull and upper cervical spine: Normal calvarium and skull base. Visualized upper cervical spine and soft tissues are normal. Sinuses/Orbits:No paranasal sinus fluid levels or advanced mucosal thickening. No mastoid or middle ear effusion. Normal orbits. IMPRESSION: 1. No acute intracranial abnormality. 2. Findings of chronic microvascular ischemia and generalized volume loss. Electronically Signed   By: Ulyses Jarred M.D.   On: 05/10/2021 22:59  ? ?MR CERVICAL SPINE WO CONTRAST ? ?Result Date: 05/10/2021 ?CLINICAL DATA:  Neck trauma, focal neuro deficit or paresthesia (Age 68-64y). EXAM: MRI CERVICAL SPINE WITHOUT CONTRAST TECHNIQUE: Multiplanar, multisequence MR imaging of the cervical spine was performed. No intravenous contrast was administered. COMPARISON:  04/11/2021 FINDINGS: The examination is severely degraded by motion. Visualization of the spinal cord is 4. C6-7 degenerative endplate changes are unchanged. There is no high-grade spinal canal stenosis. IMPRESSION: 1. Severely motion degraded examination. 2. No high-grade spinal canal stenosis. Electronically Signed   By: Ulyses Jarred M.D.   On: 05/10/2021 22:25  ? ?MR CERVICAL SPINE W CONTRAST ? ?Result Date: 05/11/2021 ?CLINICAL DATA:  Myelopathy, concern for epidural abscess EXAM: MRI CERVICAL SPINE WITH CONTRAST TECHNIQUE: Multiplanar, multisequence MR imaging of the cervical spine was performed following the administration of intravenous contrast. COMPARISON:  Noncontrast cervical spine MRI dated 1 day prior, cervical spine MRI 04/11/2021 FINDINGS: Image quality is degraded by motion artifact. Only axial and sagittal T1 postcontrast images were provided. Alignment: There is straightening of the normal cervical spine lordosis. There is no antero or  retrolisthesis. Vertebrae: There is no suspicious enhancement. There is no evidence of discitis/osteomyelitis. There is no evidence of epidural abscess. Cord: There is no abnormal cord enhancement. Posterior Fossa, vertebral arteries, paraspinal tissues: The imaged posterior fossa is grossly unremarkable. There is enhancement in the interspinous spaces throughout the cervical spine. There is no evidence of abscess in the imaged soft tissues. Disc levels: There is no high-grade spinal canal stenosis. IMPRESSION: 1. No suspicious enhancement or evidence of discitis/osteomyelitis or epidural abscess. 2. Enhancement of the interspinous spaces throughout the cervical spine is nonspecific but could be infectious or inflammatory in nature. Electronically Signed   By: Valetta Mole M.D.   On: 05/11/2021 12:06  ? ?MR THORACIC SPINE W WO CONTRAST ? ?Addendum Date: 05/11/2021   ?ADDENDUM REPORT: 05/11/2021 12:12 ADDENDUM: There is smooth enhancement along the dorsal aspect of the canal extending through much of the T spine (best seen on sagittal image 28-9). The appearance is not typical for epidural abscess or phlegmon, but this cannot be entirely excluded. There is mild edema in the right facet joint at T4-T5 without osseous destruction to suggest septic arthritis, favored degenerative in nature (22-6). Consider follow up imaging in 2-3 days to reassess the intraspinal findings. Diffuse soft tissue edema is likely related to anasarca. This  addendum will be called to the ordering clinician or representative by the Radiologist Assistant, and communication documented in the PACS or [employer]. Electronically Signed   By: Valetta Mole M.D.   On: 05/11/2021 12:12  ? ?Result Date: 05/11/2021 ?CLINICAL DATA:  Epidural abscess suspected. Multiple falls, history of CVA, myelopathy EXAM: MRI THORACIC WITHOUT AND WITH CONTRAST TECHNIQUE: Multiplanar and multiecho pulse sequences of the thoracic spine were obtained without and with  intravenous contrast. CONTRAST:  6.20mL GADAVIST GADOBUTROL 1 MMOL/ML IV SOLN, 74mL GADAVIST GADOBUTROL 1 MMOL/ML IV SOLN COMPARISON:  CT chest 05/10/2021 FINDINGS: Image quality is markedly degraded by motion artifact. A

## 2021-05-11 NOTE — H&P (Signed)
?History and Physical  ? ? ?Patient: Logan Nelson PHX:[id number] DOB: 1965-11-15 ?DOA: 05/10/2021 ?DOS: the patient was seen and examined on 05/11/2021 ?PCP: [name], MD  ?Patient coming from: Home ? ?Chief Complaint:  ?Chief Complaint  ?Patient presents with  ? Leg Swelling  ? ?HPI: Logan Nelson is a 56 y.o. male with medical history significant of severe protein calorie malnutrition, HTN, anasarca, abdominal wall hernia. Presenting with BLE weakness. He was recently hospitalized for an abdominal hernia w/ incarceration. He successfully completed an open primary repair; however, he left AMA before he he was placed in rehab. He reports that he hasn't been able to walk since hie surgery. When he left AMA, it was with assistance. When he got home, he was not able to get around this house on his own. He reports that when he would fall, he would have to stay on the floor until someone found him. This morning he fell again. So he called for EMS.   ? ?Review of Systems: As mentioned in the history of present illness. All other systems reviewed and are negative. ?Past Medical History:  ?Diagnosis Date  ? Anemia   ? Arthritis   ? Bradycardia   ? Dyspnea   ? Falling episodes   ? GERD (gastroesophageal reflux disease)   ? Hernia of abdominal wall   ? Hypertension   ? Lower extremity edema   ? ?Past Surgical History:  ?Procedure Laterality Date  ? ABDOMINAL SURGERY    ? INGUINAL HERNIA REPAIR Right 04/30/2021  ? Procedure: HERNIA REPAIR INGUINAL WITH MESH;  Surgeon: Stechschulte, Nickola Major, MD;  Location: [facility];  Service: General;  Laterality: Right;  ? LYSIS OF ADHESION  04/30/2021  ? Procedure: LYSIS OF ADHESION;  Surgeon: Felicie Morn, MD;  Location: [facility];  Service: General;;  ? UMBILICAL HERNIA REPAIR N/A 04/30/2021  ? Procedure: OPEN UMBILICAL AND EPIGASTRIC HERNIA REPAIR;  Surgeon: Felicie Morn, MD;  Location: [facility];  Service: General;  Laterality: N/A;  ? ?Social History:  reports that he  quit smoking about a year ago. His smoking use included cigarettes. He has never used smokeless tobacco. He reports that he does not currently use alcohol. He reports that he does not currently use drugs. ? ?No Known Allergies ? ?No family history on file. ? ?Prior to Admission medications   ?Medication Sig Start Date End Date Taking? Authorizing Provider  ?aspirin 81 MG chewable tablet Chew 1 tablet (81 mg total) by mouth daily. ?Patient not taking: Reported on 04/23/2021 04/14/21 05/14/21  Lacinda Axon, MD  ?feeding supplement (ENSURE ENLIVE / ENSURE PLUS) LIQD Take 237 mLs by mouth 3 (three) times daily between meals. ?Patient not taking: Reported on 04/23/2021 04/14/21   Lacinda Axon, MD  ?Multiple Vitamins-Minerals (CERTAVITE/ANTIOXIDANTS) TABS Take 1 tablet by mouth daily. ?Patient not taking: Reported on 04/23/2021 04/14/21 05/14/21  Lacinda Axon, MD  ?ondansetron (ZOFRAN) 4 MG tablet Take 1 tablet (4 mg total) by mouth every 8 (eight) hours as needed for nausea or vomiting. ?Patient not taking: Reported on 04/23/2021 04/14/21 04/14/22  Lacinda Axon, MD  ?Vitamin D, Ergocalciferol, (DRISDOL) 1.25 MG (50000 UNIT) CAPS capsule Take 1 capsule (50,000 Units total) by mouth every 7 (seven) days. ?Patient not taking: Reported on 04/23/2021 04/20/21 05/20/21  Lacinda Axon, MD  ? ? ?Physical Exam: ?Vitals:  ? 05/11/21 0230 05/11/21 0330 05/11/21 0400 05/11/21 0700  ?BP:   (!) 129/92 (!) 138/103  ?Pulse:  Marland Kitchen)  104 (!) 106 87  ?Resp: 14 15 15 12   ?Temp:      ?TempSrc:      ?SpO2:  100% 98% 98%  ? ?General: 56 y.o. chronically ill appearin male resting in bed in NAD ?Eyes: PERRL, normal sclera ?ENMT: Nares patent w/o discharge, orophaynx clear, dentition poor, ears w/o discharge/lesions/ulcers ?Neck: Supple, trachea midline ?Cardiovascular: RRR, +S1, S2, no m/g/r, equal pulses throughout ?Respiratory: CTABL, no w/r/r, normal WOB ?GI: BS+, NDNT, no masses noted, no organomegaly noted ?MSK: No c/c; BLE  edema ?Neuro: A&O x 3, BLE msk str 3/5, RUE msk str 4/5 ?Psyc: Appropriate interaction but flat affect, calm/cooperative ? ?Data Reviewed: ? ?K+   3.0 ?Alk phos  166 ?Albumin 2.0 ?Ca2+  7.3 ?AST  58 ?ALT  91 ? ?MRI Brain:  ?1. No acute intracranial abnormality. ?2. Findings of chronic microvascular ischemia and generalized volume loss. ? ?MRI C -spine:  ?IMPRESSION: ?1. No suspicious enhancement or evidence of discitis/osteomyelitis or epidural abscess. ?2. Enhancement of the interspinous spaces throughout the cervical spine is nonspecific but could be infectious or inflammatory in nature. ? ?MRI T-spime ?There is smooth enhancement along the dorsal aspect of the canal extending through much of the T spine (best seen on sagittal image 28-9). The appearance is not typical for epidural abscess or phlegmon, but this cannot be entirely excluded. There is mild edema in the right facet joint at T4-T5 without osseous destruction to suggest septic arthritis, favored degenerative in nature (22-6). Consider follow up imaging in 2-3 days to reassess the intraspinal findings. ?Diffuse soft tissue edema is likely related to anasarca. ? ?Assessment and Plan: ?No notes have been filed under this hospital service. ?Service: Hospitalist ?BLE weakness ?RUE weakness ?    - admit to inpt, tele @ [facility] ?    - neuro consulted; appreciate assistance ?    - requested MRIs as above ?    - discussed MRI findings w/ Dr. Cheral Marker; he is reviewing the imaging now ?    - defer further imaging, procedures to neuro at this time ?    - PT/OT eval ? ?Severe protein-calorie malnutrition ?    - dietitian consult ? ?Anasarca ?    - secondary to above; follow ? ?S/p open primary repair of chronically incarcerated ventral incisional hernia, primary right inguinal hernia repair without mesh, extensive lysis of adhesions ?    - this was a recent surgery performed on 3/24 ?    - he left AMA from that admission ?    - his bandages have not been changed since ?     - I have asked surgery to weigh in; appreciate their assistance ? ?HTN ?    - resume home regimen when confirmed ? ?Anemia of chronic disease ?    - stable, no evidence of bleed, follow ?    ?GERD ?    - resume home regimen when confirmed ? ?Hypokalemia ?    - replace K+, Mg2+ is ok ? ?Pseudohypocalcemia ?    - Ca2+ corrects to 8.9 ? ? Advance Care Planning:   Code Status: FULL ? ?Consults: Neurology ? ?Family Communication: None at bedside ? ?Severity of Illness: ?The appropriate patient status for this patient is OBSERVATION. Observation status is judged to be reasonable and necessary in order to provide the required intensity of service to ensure the patient's safety. The patient's presenting symptoms, physical exam findings, and initial radiographic and laboratory data in the context of their medical condition  is felt to place them at decreased risk for further clinical deterioration. Furthermore, it is anticipated that the patient will be medically stable for discharge from the hospital within 2 midnights of admission.  ? ?Author: ?Jonnie Finner, DO ?05/11/2021 12:19 PM ? ?For on call review [email or web address].  ?

## 2021-05-11 NOTE — ED Notes (Signed)
[facility] called. 

## 2021-05-11 NOTE — Progress Notes (Signed)
I was asked by my day colleagues to follow up on imaging. ?Fluctuating weakness with no clear clinical localization. ?MRI brain negative. Similar complaints and negative MRI a month ago. ?MR C-spine was motion riddled but no acute changes. ?Could not tolerate MR T-spine. ?No acute neurological work up at this time. ?Can follow outpatient  ?Please call with any questions for the inpatient neurology team as needed. ? ?-- ?Amie Portland, MD ?Neurologist ?[facility] ?Pager: [phone] ? ?

## 2021-05-11 NOTE — Consult Note (Signed)
Neurology Consultation ? ?Reason for Consult: right sided weakness, gait difficulty ?Referring Physician: Dr Cherylann Ratel, [facility] ? ?CC: right sided weakness, gait difficulty ? ?History is obtained from: patient and chart ? ?HPI: Logan Nelson is a 56 y.o. male past medical history of hypertension, GERD, B12 deficiency, multiple falls, severe protein energy malnutrition, anasarca, status post recent surgery for abdominal hernia with incarceration after which she left AMA before he was placed in rehab and came back to the hospital complaining of difficulty walking and right-sided weakness.  His examination has been somewhat fluctuating prompting multiple imaging studies in the form of MRI of the brain, MRI of the C-spine and MRI of the T-spine have shown nonspecific findings without a clear cause. ?He has been seen by neurology last month with concern for stroke and stroke was ruled out. ?He returns back with similar right-sided complaints and gait difficulty.  At the time of last neurological evaluation, he was also positive on his urinary toxicology screen for cocaine.  He reports continuing progressive worsening of the weakness and is not a very good historian. ? ? ? ?ROS: Full ROS was performed and is negative except as noted in the HPI.  ? ?Past Medical History:  ?Diagnosis Date  ? Anemia   ? Arthritis   ? Bradycardia   ? Dyspnea   ? Falling episodes   ? GERD (gastroesophageal reflux disease)   ? Hernia of abdominal wall   ? Hypertension   ? Lower extremity edema   ? ?No family history on file. ? ?Social History:  ? reports that he quit smoking about a year ago. His smoking use included cigarettes. He has never used smokeless tobacco. He reports that he does not currently use alcohol. He reports that he does not currently use drugs.  Tobacco and cocaine ?  ?Medications ? ?Current Facility-Administered Medications:  ?  ibuprofen (ADVIL) tablet 400 mg, 400 mg, Oral, Q6H PRN, Marylyn Ishihara, Tyrone A, DO, 400 mg at  05/11/21 1557 ?  potassium chloride SA (KLOR-CON M) CR tablet 40 mEq, 40 mEq, Oral, Daily, Marylyn Ishihara, Tyrone A, DO ? ?Exam: ?Current vital signs: ?BP 135/89 (BP Location: Right Wrist)   Pulse 89   Temp 97.9 ?F (36.6 ?C) (Oral)   Resp 18   SpO2 100%  ?Vital signs in last 24 hours: ?Temp:  [97.9 ?F (36.6 ?C)] 97.9 ?F (36.6 ?C) (04/04 1935) ?Pulse Rate:  [67-106] 89 (04/04 1935) ?Resp:  [11-20] 18 (04/04 1935) ?BP: (123-138)/(87-106) 135/89 (04/04 1935) ?SpO2:  [92 %-100 %] 100 % (04/04 1935) ?General: Awake alert in no distress ?HEENT: Normocephalic/atraumatic ?CVs: Regular rhythm ?Respiratory: Breathing well saturating normally on room air ?Abdomen with abdominal wall edema ?Extremities with pitting edema bilaterally in the upper and lower extremities ?Neurological exam ?Awake alert oriented x3 ?No aphasia ?No dysarthria ?Mildly diminished attention concentration ?Cranial nerves II to XII intact ?Motor examination with right upper extremity which was not antigravity initially but when I raised his arm up he was able to hold it up antigravity for at least 2 to 3 seconds.  He has some grip strength weakness on the right 3/5.  Left upper extremity is full strength 5/5.  Both lower extremities have left foot dependent weakness but he is at least 4/5 in both extremities with positive Hoover signs. ?Sensation: Intact to touch but reports feeling numbness more so in the right upper extremity. ?Coordination exam with bilateral dysmetria on finger-to-nose which is unchanged from prior neurological evaluations in March ?Gait testing  was deferred ? ? ?Labs ?I have reviewed labs in epic and the results pertinent to this consultation are: ? ?CBC ?   ?Component Value Date/Time  ? WBC 8.6 05/11/2021 1415  ? RBC 3.42 (L) 05/11/2021 1415  ? HGB 10.7 (L) 05/11/2021 1415  ? HGB 12.3 (L) 12/08/2020 1103  ? HCT 32.4 (L) 05/11/2021 1415  ? HCT 38.6 12/08/2020 1103  ? PLT 385 05/11/2021 1415  ? PLT 259 12/08/2020 1103  ? MCV 94.7 05/11/2021  1415  ? MCV 93 12/08/2020 1103  ? MCH 31.3 05/11/2021 1415  ? MCHC 33.0 05/11/2021 1415  ? RDW 15.9 (H) 05/11/2021 1415  ? RDW 12.7 12/08/2020 1103  ? LYMPHSABS 1.2 05/11/2021 1415  ? LYMPHSABS 1.7 12/08/2020 1103  ? MONOABS 0.4 05/11/2021 1415  ? EOSABS 0.0 05/11/2021 1415  ? EOSABS 0.1 12/08/2020 1103  ? BASOSABS 0.0 05/11/2021 1415  ? BASOSABS 0.0 12/08/2020 1103  ? ? ?CMP  ?   ?Component Value Date/Time  ? NA 145 05/11/2021 1415  ? NA 143 12/08/2020 1103  ? K 3.4 (L) 05/11/2021 1415  ? CL 114 (H) 05/11/2021 1415  ? CO2 28 05/11/2021 1415  ? GLUCOSE 92 05/11/2021 1415  ? BUN 16 05/11/2021 1415  ? BUN 14 12/08/2020 1103  ? CREATININE 0.53 (L) 05/11/2021 1415  ? CALCIUM 7.4 (L) 05/11/2021 1415  ? PROT 5.0 (L) 05/11/2021 1415  ? PROT 6.7 12/08/2020 1103  ? ALBUMIN 1.8 (L) 05/11/2021 1415  ? ALBUMIN 3.8 12/08/2020 1103  ? AST 47 (H) 05/11/2021 1415  ? ALT 70 (H) 05/11/2021 1415  ? ALKPHOS 153 (H) 05/11/2021 1415  ? BILITOT 0.7 05/11/2021 1415  ? BILITOT 0.2 12/08/2020 1103  ? GFRNONAA >60 05/11/2021 1415  ? GFRAA >60 01/23/2019 1523  ? ? ?Lipid Panel  ?   ?Component Value Date/Time  ? CHOL 62 04/23/2021 1801  ? TRIG 41 05/03/2021 0435  ? HDL 26 (L) 04/23/2021 1801  ? CHOLHDL 2.4 04/23/2021 1801  ? VLDL 10 04/23/2021 1801  ? [place] 26 04/23/2021 1801  ? ? ? ?Imaging ?I have reviewed the images obtained: ? ?MRI examination of the brain ?IMPRESSION: ?1. No acute intracranial abnormality. ?2. Findings of chronic microvascular ischemia and generalized volume ?loss. ? ?MR C-spine-w+w/o ?IMPRESSION: ?1. No suspicious enhancement or evidence of discitis/osteomyelitis ?or epidural abscess. ?2. Enhancement of the interspinous spaces throughout the cervical ?spine is nonspecific but could be infectious or inflammatory in ?nature. ?  ?MR T-spine w+w/o ?IMPRESSION: ?1. Motion degraded study. Within this confine, no evidence of acute ?abnormality in the thoracic spine. No evidence of ?discitis/osteomyelitis or epidural  abscess. ?2. Partially imaged ascites and bilateral pleural effusions with ?adjacent airspace disease. ?ADDENDUM REPORT: 05/11/2021 12:12 ? ADDENDUM: ?There is smooth enhancement along the dorsal aspect of the canal ?extending through much of the T spine (best seen on sagittal image ?28-9). The appearance is not typical for epidural abscess or ?phlegmon, but this cannot be entirely excluded. There is mild edema ?in the right facet joint at T4-T5 without osseous destruction to ?suggest septic arthritis, favored degenerative in nature (22-6). ?Consider follow up imaging in 2-3 days to reassess the intraspinal ?findings.  ?Diffuse soft tissue edema is likely related to anasarca. ? ?Assessment:  ?56 year old man with above past medical history presenting for progressive generalized weakness more so on the right side with right upper extremity with the worst weakness along with gait abnormality-unable to support his weight on both legs that has been progressively worse  over the last 4 to 6 months. ?His clinical exam is consistent with anasarca and has medical evaluation done during last admission and by the medical team this time also is concerning for severe protein calorie malnutrition. ?Has generalized weakness is likely secondary to the neurological manifestations of malnutrition and cachexia and is exam findings are somewhat perplexing and cannot be localized. ?That said, his MRI of the cervical and thoracic spine does show some interspinous space enhancement in the cervical spine MRI and questionable subtle enhancement along the dorsal aspect of the canal in the T-spine-which is not typical for epidural abscess but I would recommend discussion with neurosurgery on this and. ? ?Brain MRI with no evidence of acute stroke ? ? ?Recommendations: ?Medical management of malnutrition per primary team as you are ?Neurosurgical consultation for enhancement around the cervical spine and the dorsal thoracic spine. ?Labs  reassuring.  No fevers-less likely to be an acute infectious process in the spine. ?Suspect generalized deconditioning in the setting of multiple medical comorbidities as well as malnutrition being the cause of his weakness a

## 2021-05-11 NOTE — ED Notes (Signed)
Patient was incontinent of small amount of light brown stool. Pt cleaned up and bed bad changed. Sacral wound noted - 5 cm x 2 cm. ?

## 2021-05-11 NOTE — ED Provider Notes (Signed)
Patient seen yesterday evening for swelling to legs as well as worsening weakness on his right side.  Neurology had phone consultation and recommended MRI of brain and cervical and thoracic spine.  Patient's MRI brain and cervical spine without acute abnormalities.  Cannot obtain MRI of thoracic spine due to patient moving around.  On my exam now, patient has right upper extremity weakness.  We consulted neurology who recommends admission at [facility] for further evaluation ?  ?Lorre Nick, MD ?05/11/21 [phone] ? ?

## 2021-05-11 NOTE — Progress Notes (Addendum)
.  Transition of Care [facility]) - Emergency Department Mini Assessment ? ? ?Patient Details  ?Name: Logan Nelson ?MRN: [id number] ?Date of Birth: [birth year] ? ?Transition of Care (TOC) CM/SW Contact:    ?Arlie Solomons Kymberlie Brazeau, LCSW ?Phone Number: ?05/11/2021, 8:38 AM ? ? ?Clinical Narrative: ? ?Pt with multiple falls, would call EMS to help him off the floor. Pt reported he lives with elderly grandparent. Pt was admitted to [facility] last week left AMA. Pt stated he left the hospital due to not be able to get is pain medication at the time he asked. Pt stated he was able to walk last week however he has gotten weaker. Pt stated he has a rolling walker at home but does not know how to use it. TOC discussed with pt his barriers to placement last admission. Pt has no payor source for SNF placement. CSW inquired about when pt applied for SS disability and Medicaid , pt is unable to give straight answer. Unknown if it has been submitted. Pt stated he does not know who can come pick him up upon d/c . Pt reported he does not know if he can go back to the address on chart.  Pt is requesting a wheel chair. CSW explained to pt it may be difficult to obtain a wheel chair as pt does not have a payor source. CSW will send charity referral out to try to obtain wheel chair. ? ? ?9:40am ?Pt is being admitted. CSW will send referral to [employer] to help assist pt apply for medicaid and disability.  ? ? ?ED Mini Assessment: ?  ? ?Barriers to Discharge: Continued Medical Work up ? ?  ? ?  ? ?Interventions which prevented an admission or readmission: SNF Placement, Home Health Consult or Services ? ? ? ?Patient Contact and Communications ?  ?  ?  ? ,     ?  ?  ? ?  ?  ?  ? ?Admission diagnosis:  Frequent Falls  ?Patient Active Problem List  ? Diagnosis Date Noted  ? Bilateral inguinal hernia 04/23/2021  ? Protein-calorie malnutrition, severe 04/14/2021  ? Protein calorie malnutrition ([place]) 04/13/2021  ? Protein-calorie malnutrition ([place])  04/12/2021  ? Hepatic steatosis 04/12/2021  ? Stroke [facility]) 04/11/2021  ? Small bowel obstruction ([place]) 10/13/2020  ? Anemia due to vitamin B12 deficiency 10/13/2020  ? Hypertension 01/09/2015  ? 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 ?PCP:  [name], MD ?Pharmacy:   ?[facility] [address] ?[place] [address] ?Phone: [phone] Fax: [phone] ? ?[facility] ?[address] ?[place] [address] ?Phone: [phone] Fax: [phone] ?  ?

## 2021-05-11 NOTE — ED Notes (Signed)
Family updated as to patient's status.

## 2021-05-11 NOTE — ED Notes (Signed)
Called and spoke with MRI tech to ask if patient was able to complete all exams, as I was not notified otherwise upon pt's return to ED. MRI tech states that pt's head and C-spine studies were limited due to motion and pt was unable to tolerate laying flat/still for the thoracic imaging. Dr Adela Lank updated and states that we can pre-medicate patient to complete imaging if necessary. ?

## 2021-05-11 NOTE — ED Notes (Signed)
Patient moved onto hospital bed for comfort. Pt persistently refusing to take off soiled t shirt and change into a gown despite efforts to persuade him due to shirt having stool on it. Pt repositioned to laying on his R side with pillow between his legs. Pt provided warm blankets. Call light in reach. ?

## 2021-05-11 NOTE — Progress Notes (Signed)
TOC CSW received an message about [employer]'s screening,  pt was screened by Dionicia Abler last week when he was admitted and found to not have a Medicaid program at this time. His last Medicaid application was denied for not meeting a disability. According to [employer] nothing in pt's chart has changed.   ? ?Valentina Shaggy.Lazar Tierce, MSW, LCSWA ?[facility]  Transitions of Care ?Clinical Social Worker I ?Direct Dial: [phone]  Fax: [phone] ?[email or web address]  ?

## 2021-05-11 NOTE — ED Notes (Signed)
MRI called to update that machine is still not working and they should have further updates closer to 0900. Consulting civil engineer and provider notified. ?

## 2021-05-11 NOTE — ED Notes (Signed)
Spoke with MRI tech and stated that patient could be medicated prior to imaging if they are able to take him back for thoracic spine MRI and MRI tech states that the MRI machine just stopped working and the imaging cant be completed until the morning. Dr Tyrone Nine notified. ?

## 2021-05-12 ENCOUNTER — Inpatient Hospital Stay: Payer: Self-pay | Admitting: Family Medicine

## 2021-05-12 DIAGNOSIS — Z7982 Long term (current) use of aspirin: Secondary | ICD-10-CM | POA: Diagnosis not present

## 2021-05-12 DIAGNOSIS — E43 Unspecified severe protein-calorie malnutrition: Secondary | ICD-10-CM | POA: Diagnosis not present

## 2021-05-12 DIAGNOSIS — A4101 Sepsis due to Methicillin susceptible Staphylococcus aureus: Secondary | ICD-10-CM | POA: Diagnosis not present

## 2021-05-12 DIAGNOSIS — I5031 Acute diastolic (congestive) heart failure: Secondary | ICD-10-CM | POA: Diagnosis not present

## 2021-05-12 DIAGNOSIS — R64 Cachexia: Secondary | ICD-10-CM | POA: Diagnosis present

## 2021-05-12 DIAGNOSIS — K219 Gastro-esophageal reflux disease without esophagitis: Secondary | ICD-10-CM | POA: Diagnosis not present

## 2021-05-12 DIAGNOSIS — L89153 Pressure ulcer of sacral region, stage 3: Secondary | ICD-10-CM | POA: Diagnosis not present

## 2021-05-12 DIAGNOSIS — I509 Heart failure, unspecified: Secondary | ICD-10-CM | POA: Diagnosis not present

## 2021-05-12 DIAGNOSIS — M4622 Osteomyelitis of vertebra, cervical region: Secondary | ICD-10-CM | POA: Diagnosis not present

## 2021-05-12 DIAGNOSIS — I480 Paroxysmal atrial fibrillation: Secondary | ICD-10-CM | POA: Diagnosis not present

## 2021-05-12 DIAGNOSIS — I5021 Acute systolic (congestive) heart failure: Secondary | ICD-10-CM | POA: Diagnosis not present

## 2021-05-12 DIAGNOSIS — D649 Anemia, unspecified: Secondary | ICD-10-CM | POA: Diagnosis not present

## 2021-05-12 DIAGNOSIS — R7881 Bacteremia: Secondary | ICD-10-CM | POA: Diagnosis not present

## 2021-05-12 DIAGNOSIS — K567 Ileus, unspecified: Secondary | ICD-10-CM | POA: Diagnosis not present

## 2021-05-12 DIAGNOSIS — E872 Acidosis, unspecified: Secondary | ICD-10-CM | POA: Diagnosis not present

## 2021-05-12 DIAGNOSIS — I10 Essential (primary) hypertension: Secondary | ICD-10-CM | POA: Diagnosis not present

## 2021-05-12 DIAGNOSIS — R531 Weakness: Secondary | ICD-10-CM

## 2021-05-12 DIAGNOSIS — Z5329 Procedure and treatment not carried out because of patient's decision for other reasons: Secondary | ICD-10-CM | POA: Diagnosis present

## 2021-05-12 DIAGNOSIS — Z79899 Other long term (current) drug therapy: Secondary | ICD-10-CM | POA: Diagnosis not present

## 2021-05-12 DIAGNOSIS — D638 Anemia in other chronic diseases classified elsewhere: Secondary | ICD-10-CM

## 2021-05-12 DIAGNOSIS — M1990 Unspecified osteoarthritis, unspecified site: Secondary | ICD-10-CM | POA: Diagnosis present

## 2021-05-12 DIAGNOSIS — R197 Diarrhea, unspecified: Secondary | ICD-10-CM | POA: Diagnosis not present

## 2021-05-12 DIAGNOSIS — I110 Hypertensive heart disease with heart failure: Secondary | ICD-10-CM | POA: Diagnosis present

## 2021-05-12 DIAGNOSIS — G062 Extradural and subdural abscess, unspecified: Secondary | ICD-10-CM | POA: Diagnosis not present

## 2021-05-12 DIAGNOSIS — R29898 Other symptoms and signs involving the musculoskeletal system: Secondary | ICD-10-CM | POA: Diagnosis not present

## 2021-05-12 DIAGNOSIS — I340 Nonrheumatic mitral (valve) insufficiency: Secondary | ICD-10-CM | POA: Diagnosis present

## 2021-05-12 DIAGNOSIS — K760 Fatty (change of) liver, not elsewhere classified: Secondary | ICD-10-CM | POA: Diagnosis present

## 2021-05-12 DIAGNOSIS — G061 Intraspinal abscess and granuloma: Secondary | ICD-10-CM | POA: Diagnosis not present

## 2021-05-12 DIAGNOSIS — I471 Supraventricular tachycardia: Secondary | ICD-10-CM | POA: Diagnosis not present

## 2021-05-12 DIAGNOSIS — E8809 Other disorders of plasma-protein metabolism, not elsewhere classified: Secondary | ICD-10-CM | POA: Diagnosis present

## 2021-05-12 DIAGNOSIS — K2100 Gastro-esophageal reflux disease with esophagitis, without bleeding: Secondary | ICD-10-CM | POA: Diagnosis not present

## 2021-05-12 DIAGNOSIS — Z6822 Body mass index (BMI) 22.0-22.9, adult: Secondary | ICD-10-CM | POA: Diagnosis not present

## 2021-05-12 DIAGNOSIS — I5041 Acute combined systolic (congestive) and diastolic (congestive) heart failure: Secondary | ICD-10-CM | POA: Diagnosis not present

## 2021-05-12 DIAGNOSIS — I6990 Unspecified sequelae of unspecified cerebrovascular disease: Secondary | ICD-10-CM | POA: Diagnosis not present

## 2021-05-12 DIAGNOSIS — Z8673 Personal history of transient ischemic attack (TIA), and cerebral infarction without residual deficits: Secondary | ICD-10-CM | POA: Diagnosis not present

## 2021-05-12 DIAGNOSIS — L89150 Pressure ulcer of sacral region, unstageable: Secondary | ICD-10-CM | POA: Diagnosis not present

## 2021-05-12 DIAGNOSIS — B9561 Methicillin susceptible Staphylococcus aureus infection as the cause of diseases classified elsewhere: Secondary | ICD-10-CM | POA: Diagnosis not present

## 2021-05-12 DIAGNOSIS — R601 Generalized edema: Secondary | ICD-10-CM | POA: Diagnosis not present

## 2021-05-12 DIAGNOSIS — E559 Vitamin D deficiency, unspecified: Secondary | ICD-10-CM | POA: Diagnosis not present

## 2021-05-12 DIAGNOSIS — L89302 Pressure ulcer of unspecified buttock, stage 2: Secondary | ICD-10-CM | POA: Diagnosis not present

## 2021-05-12 DIAGNOSIS — M00011 Staphylococcal arthritis, right shoulder: Secondary | ICD-10-CM | POA: Diagnosis not present

## 2021-05-12 DIAGNOSIS — E876 Hypokalemia: Secondary | ICD-10-CM | POA: Diagnosis not present

## 2021-05-12 DIAGNOSIS — L89322 Pressure ulcer of left buttock, stage 2: Secondary | ICD-10-CM | POA: Diagnosis not present

## 2021-05-12 DIAGNOSIS — R609 Edema, unspecified: Secondary | ICD-10-CM | POA: Diagnosis not present

## 2021-05-12 LAB — C DIFFICILE QUICK SCREEN W PCR REFLEX
C Diff antigen: NEGATIVE
C Diff interpretation: NOT DETECTED
C Diff toxin: NEGATIVE

## 2021-05-12 LAB — COMPREHENSIVE METABOLIC PANEL
ALT: 58 U/L — ABNORMAL HIGH (ref 0–44)
AST: 40 U/L (ref 15–41)
Albumin: <1.5 g/dL — ABNORMAL LOW (ref 3.5–5.0)
Alkaline Phosphatase: 140 U/L — ABNORMAL HIGH (ref 38–126)
Anion gap: 5 (ref 5–15)
BUN: 15 mg/dL (ref 6–20)
CO2: 24 mmol/L (ref 22–32)
Calcium: 7.3 mg/dL — ABNORMAL LOW (ref 8.9–10.3)
Chloride: 114 mmol/L — ABNORMAL HIGH (ref 98–111)
Creatinine, Ser: 0.56 mg/dL — ABNORMAL LOW (ref 0.61–1.24)
GFR, Estimated: >60 mL/min (ref >60)
Glucose, Bld: 93 mg/dL (ref 70–99)
Potassium: 3.4 mmol/L — ABNORMAL LOW (ref 3.5–5.1)
Sodium: 143 mmol/L (ref 135–145)
Total Bilirubin: 0.4 mg/dL (ref 0.3–1.2)
Total Protein: 4.1 g/dL — ABNORMAL LOW (ref 6.5–8.1)

## 2021-05-12 LAB — CBC
HCT: 29.1 % — ABNORMAL LOW (ref 39.0–52.0)
Hemoglobin: 9.3 g/dL — ABNORMAL LOW (ref 13.0–17.0)
MCH: 30 pg (ref 26.0–34.0)
MCHC: 32 g/dL (ref 30.0–36.0)
MCV: 93.9 fL (ref 80.0–100.0)
Platelets: 318 10*3/uL (ref 150–400)
RBC: 3.1 MIL/uL — ABNORMAL LOW (ref 4.22–5.81)
RDW: 15.8 % — ABNORMAL HIGH (ref 11.5–15.5)
WBC: 13.2 10*3/uL — ABNORMAL HIGH (ref 4.0–10.5)
nRBC: 0 % (ref 0.0–0.2)

## 2021-05-12 LAB — GASTROINTESTINAL PANEL BY PCR, STOOL (REPLACES STOOL CULTURE)

## 2021-05-12 LAB — VITAMIN B1: Vitamin B1 (Thiamine): 127.7 nmol/L (ref 66.5–200.0)

## 2021-05-12 MED ORDER — ZINC SULFATE 220 (50 ZN) MG PO CAPS
220.0000 mg | ORAL_CAPSULE | Freq: Two times a day (BID) | ORAL | Status: AC
  Administered 2021-05-12 – 2021-05-26 (×28): 220 mg via ORAL
  Filled 2021-05-12 (×28): qty 1

## 2021-05-12 MED ORDER — LOPERAMIDE HCL 2 MG PO CAPS
4.0000 mg | ORAL_CAPSULE | ORAL | Status: DC | PRN
  Administered 2021-05-12 – 2021-05-14 (×4): 4 mg via ORAL
  Filled 2021-05-12 (×4): qty 2

## 2021-05-12 MED ORDER — ONDANSETRON HCL 4 MG PO TABS
4.0000 mg | ORAL_TABLET | Freq: Three times a day (TID) | ORAL | Status: DC | PRN
  Administered 2021-05-14: 4 mg via ORAL
  Filled 2021-05-12: qty 1

## 2021-05-12 MED ORDER — ENSURE ENLIVE PO LIQD
237.0000 mL | Freq: Three times a day (TID) | ORAL | Status: DC
  Administered 2021-05-12 – 2021-05-23 (×22): 237 mL via ORAL

## 2021-05-12 MED ORDER — DIPHENOXYLATE-ATROPINE 2.5-0.025 MG PO TABS
2.0000 | ORAL_TABLET | Freq: Four times a day (QID) | ORAL | Status: DC | PRN
  Administered 2021-05-12 – 2021-05-18 (×11): 2 via ORAL
  Filled 2021-05-12 (×11): qty 2

## 2021-05-12 MED ORDER — HYDROCODONE-ACETAMINOPHEN 5-325 MG PO TABS
1.0000 | ORAL_TABLET | Freq: Four times a day (QID) | ORAL | Status: DC | PRN
  Administered 2021-05-12 – 2021-05-13 (×4): 1 via ORAL
  Filled 2021-05-12 (×4): qty 1

## 2021-05-12 MED ORDER — ADULT MULTIVITAMIN W/MINERALS CH
1.0000 | ORAL_TABLET | Freq: Every day | ORAL | Status: DC
  Administered 2021-05-12 – 2021-06-16 (×34): 1 via ORAL
  Filled 2021-05-12 (×34): qty 1

## 2021-05-12 MED ORDER — POTASSIUM CHLORIDE CRYS ER 20 MEQ PO TBCR
40.0000 meq | EXTENDED_RELEASE_TABLET | Freq: Once | ORAL | Status: AC
  Administered 2021-05-12: 40 meq via ORAL
  Filled 2021-05-12: qty 2

## 2021-05-12 MED ORDER — ASPIRIN 81 MG PO CHEW
81.0000 mg | CHEWABLE_TABLET | Freq: Every day | ORAL | Status: DC
Start: 2021-05-12 — End: 2021-06-16
  Administered 2021-05-12 – 2021-06-16 (×36): 81 mg via ORAL
  Filled 2021-05-12 (×36): qty 1

## 2021-05-12 MED ORDER — LOPERAMIDE HCL 2 MG PO CAPS
2.0000 mg | ORAL_CAPSULE | ORAL | Status: DC | PRN
  Administered 2021-05-12 (×3): 2 mg via ORAL
  Filled 2021-05-12 (×3): qty 1

## 2021-05-12 MED ORDER — PANTOPRAZOLE SODIUM 40 MG PO TBEC
40.0000 mg | DELAYED_RELEASE_TABLET | Freq: Every day | ORAL | Status: DC
  Administered 2021-05-12 – 2021-06-02 (×22): 40 mg via ORAL
  Filled 2021-05-12 (×22): qty 1

## 2021-05-12 MED ORDER — VITAMIN A 3 MG (10000 UNIT) PO CAPS
10000.0000 [IU] | ORAL_CAPSULE | Freq: Every day | ORAL | Status: AC
  Administered 2021-05-12 – 2021-06-10 (×29): 10000 [IU] via ORAL
  Filled 2021-05-12 (×30): qty 1

## 2021-05-12 MED ORDER — VITAMIN D (ERGOCALCIFEROL) 1.25 MG (50000 UNIT) PO CAPS
50000.0000 [IU] | ORAL_CAPSULE | ORAL | Status: AC
  Administered 2021-05-12 – 2021-06-15 (×6): 50000 [IU] via ORAL
  Filled 2021-05-12 (×7): qty 1

## 2021-05-12 MED ORDER — K PHOS MONO-SOD PHOS DI & MONO 155-852-130 MG PO TABS
500.0000 mg | ORAL_TABLET | Freq: Three times a day (TID) | ORAL | Status: DC
  Administered 2021-05-12 – 2021-05-15 (×11): 500 mg via ORAL
  Filled 2021-05-12 (×12): qty 2

## 2021-05-12 NOTE — Progress Notes (Addendum)
Initial Nutrition Assessment ? ?DOCUMENTATION CODES:  ? ?Severe malnutrition in context of chronic illness ? ?INTERVENTION:  ? ?- Boost Breeze po TID, each supplement provides 250 kcal and 9 grams of protein ? ?- Magic Cup TID with meals, each supplement provides 290 kcal and 9 grams of protein ? ?- Double protein portions TID with meals ? ?- Continue MVI with minerals daily ? ?- Continue vitamin D 50,000 units weekly given vitamin D deficiency ? ?- Recommend vitamin A 10,000 units daily x 1 month given previously identified vitamin A deficiency, okay to order per MD ? ?- Recommend zinc sulfate 220 mg BID x 14 days given previously identified zinc deficiency, okay to order per MD ? ?NUTRITION DIAGNOSIS:  ? ?Severe Malnutrition related to chronic illness as evidenced by severe fat depletion, severe muscle depletion, percent weight loss (17.9% weight loss in less than 7 months). ? ?GOAL:  ? ?Patient will meet greater than or equal to 90% of their needs ? ?MONITOR:  ? ?PO intake, Supplement acceptance, Labs, Weight trends, Skin, I & O's ? ?REASON FOR ASSESSMENT:  ? ?Malnutrition Screening Tool, Consult ?Assessment of nutrition requirement/status ? ?ASSESSMENT:  ? ?56 year old male who presented to the ED on 4/03 with fatigue, weakness, LE edema. PMH of severe malnutrition, HTN, anasarca, GERD, abdominal wall hernia. Pt with recent admission for abdominal hernia with incarceration s/p repair requiring TPN during admission. Pt left AMA from this admission. ? ?RD is familiar with pt from previous admission. During previous admission, pt required TPN which was weaned as diet was advanced post-op. ? ?Spoke with pt at bedside. Pt with lunch meal tray in front of him and was self-feeding. Pt reports that he is hungry and plans to eat the whole tray. Pt reports barely eating at home between hospital admissions. He states that he wasn't able to access food. He states that his appetite is much improved now compared to how it was  during his previous admission. ? ?Pt reports that he has tried all oral nutrition supplements and only likes [employer]. He questions why he needs to consume oral nutrition supplements if he is eating well. Discussed pt's malnutrition and increased calorie and protein needs to promote healing. Pt willing to try drinking Boost Breeze again but not interested in other oral nutrition supplements. RD will add Magic Cup to meal trays as pt has not tried these previously. Will also add double protein portions TID with meals. ? ?Pt reports that he lost a lot of weight prior to last admission but recently has been gaining weight back. He reports his UBW as "close to 200 lbs" but that was "a long time ago." Pt unsure of timeframe of weight loss. Reviewed weight history in chart. Pt with a 13.9 kg weight loss since 11/04/20. This is a 17.9% weight loss in less than 7 months which is severe and significant for timeframe. Pt meets criteria for severe malnutrition. ? ?Noted pt with hypophosphatemia and no replacement orders have been placed. Reached out to MD regarding replacement orders. Also reached out to MD regarding ordering vitamin A and zinc supplementation for deficiencies identified during last admission. MD in agreement with RD ordering vitamin A and zinc supplementation. ? ?Pt with moderate pitting edema to RUE and BLE. ? ?Meal Completion: 100% x 1 meal this AM ? ?Medications reviewed and include: klor-con 40 mEq daily, Ensure Enlive TID, MVI with minerals, protonix, vitamin D 50,000 units q 7 days ? ?Vitamin/Mineral Profile: ?Thiamine B1: 127.7 (WNL) on  4/03 ?Vitamin B12: 929 (H) on 3/06 ?Folate B9: 33.3 (WNL) on 3/17 ?Vitamin A: 10.8 (L) on 3/17 ?Vitamin D: 8.19 (L) on 3/17 ?Vitamin E (alpha tocopherol): 6.4 (L) on 3/17 ?Vitamin E (gamma tocopherol): 2.1 (WNL) on 3/17 ?Copper: 74 (WNL) on 3/17 ?Zinc: 43 (L) on 3/17 ? ?Labs reviewed: potassium 3.4, ionized calcium 1.10, phosphorus 2.0, elevated LFTs, WBC 13.2,  hemoglobin 9.3 ? ?NUTRITION - FOCUSED PHYSICAL EXAM: ? ?Flowsheet Row Most Recent Value  ?Orbital Region Severe depletion  ?Upper Arm Region Severe depletion  ?Thoracic and Lumbar Region Severe depletion  ?Buccal Region Severe depletion  ?Temple Region Severe depletion  ?Clavicle Bone Region Severe depletion  ?Clavicle and Acromion Bone Region Severe depletion  ?Scapular Bone Region Severe depletion  ?Dorsal Hand Severe depletion  ?Patellar Region Unable to assess  [severe edema to BLE]  ?Anterior Thigh Region Unable to assess  ?Posterior Calf Region Unable to assess  ?Edema (RD Assessment) Severe  [BLE]  ?Hair Reviewed  ?Eyes Reviewed  ?Mouth Reviewed (poor dentition)  ?Skin Reviewed  ?Nails Reviewed  ? ?  ? ? ?Diet Order:   ?Diet Order   ? ?       ?  Diet regular Room service appropriate? Yes; Fluid consistency: Thin  Diet effective now       ?  ? ?  ?  ? ?  ? ? ?EDUCATION NEEDS:  ? ?Education needs have been addressed ? ?Skin:  Skin Assessment: ?Skin Integrity Issues: ?Stage II: L buttock ?Incisions: closed abdomen ? ?Last BM:  05/12/21 multiple type 7 ? ?Height:  ? ?Ht Readings from Last 1 Encounters:  ?05/12/21 5\' 6"  (1.676 m)  ? ? ?Weight:  ? ?Wt Readings from Last 1 Encounters:  ?04/30/21 63.8 kg  ? ? ?BMI:  Body mass index is 22.7 kg/m?. ? ?Estimated Nutritional Needs:  ? ?Kcal:  2100-2300 ? ?Protein:  100-115 grams ? ?Fluid:  >2.0 L ? ? ? ?Gustavus Bryant, MS, RD, LDN ?Inpatient Clinical Dietitian ?Please see AMiON for contact information. ? ?

## 2021-05-12 NOTE — Plan of Care (Signed)
?  Problem: Education: ?Goal: Knowledge of General Education information will improve ?Description: Including pain rating scale, medication(s)/side effects and non-pharmacologic comfort measures ?Outcome: Progressing ?  ?Problem: Clinical Measurements: ?Goal: Respiratory complications will improve ?Outcome: Progressing ?Goal: Cardiovascular complication will be avoided ?Outcome: Progressing ?  ?Problem: Elimination: ?Goal: Will not experience complications related to urinary retention ?Outcome: Progressing ?  ?Problem: Safety: ?Goal: Ability to remain free from injury will improve ?Outcome: Progressing ?  ?Problem: Skin Integrity: ?Goal: Risk for impaired skin integrity will decrease ?Outcome: Progressing ?  ?Problem: Health Behavior/Discharge Planning: ?Goal: Ability to manage health-related needs will improve ?Outcome: Not Progressing ?  ?Problem: Clinical Measurements: ?Goal: Ability to maintain clinical measurements within normal limits will improve ?Outcome: Not Progressing ?Goal: Will remain free from infection ?Outcome: Not Progressing ?Goal: Diagnostic test results will improve ?Outcome: Not Progressing ?  ?Problem: Activity: ?Goal: Risk for activity intolerance will decrease ?Outcome: Not Progressing ?  ?Problem: Nutrition: ?Goal: Adequate nutrition will be maintained ?Outcome: Not Progressing ?  ?Problem: Coping: ?Goal: Level of anxiety will decrease ?Outcome: Not Progressing ?  ?Problem: Elimination: ?Goal: Will not experience complications related to bowel motility ?Outcome: Not Progressing ?  ?Problem: Pain Managment: ?Goal: General experience of comfort will improve ?Outcome: Not Progressing ?  ?

## 2021-05-12 NOTE — Significant Event (Signed)
Patient has had multiple episodes of watery diarrhea.  We will check stool studies.  Enteric precautions until then. ? ?Midge Minium ?

## 2021-05-12 NOTE — TOC Initial Note (Signed)
Transition of Care (TOC) - Initial/Assessment Note  ? ? ?Patient Details  ?Name: Logan Nelson ?MRN: [id number] ?Date of Birth: September 05, 1965 ? ?Transition of Care (TOC) CM/SW Contact:    ?Pollie Friar, RN ?Phone Number: ?05/12/2021, 2:05 PM ? ?Clinical Narrative:                 ?Patient is from home with his grandfather. Per patient his grandfather can no longer provide the assistance he needs. Patient is interested in discharging to a facility. He asked that CM reach out to [place] at the [facility] to see where his medicaid/ disability stands. CM has left a Advertising account executive for Cecille Rubin at the [facility].  ?Patient states he wasn't taking any medications at home. He says he hasn't had any. After last d/c from [facility] his medications were filled through [facility] and delivered to the bedside. He then had an appointment today with [facility] for PCP and would have received refills.  ?Pt states he has no DME at home.  ?TOC following for d/c disposition.  ? ?Expected Discharge Plan: [place] ?Barriers to Discharge: Continued Medical Work up, Inadequate or no insurance, SNF Pending payor source - LOG ? ? ?Patient Goals and CMS Choice ?  ?  ?  ? ?Expected Discharge Plan and Services ?Expected Discharge Plan: [place] ?In-house Referral: Clinical Social Work ?Discharge Planning Services: CM Consult ?  ?Living arrangements for the past 2 months: [place] ?                ?  ?  ?  ?  ?  ?  ?  ?  ?  ?  ? ?Prior Living Arrangements/Services ?Living arrangements for the past 2 months: [place] ?Lives with:: Other (Comment) (Grandfather) ?Patient language and need for interpreter reviewed:: Yes ?       ?Need for Family Participation in Patient Care: Yes (Comment) ?Care giver support system in place?: No (comment) ?  ?Criminal Activity/Legal Involvement Pertinent to Current Situation/Hospitalization: No - Comment as needed ? ?Activities of Daily Living ?Home Assistive  Devices/Equipment: Cane (specify quad or straight), Other (Comment) (single point cane.  Patient reports that his grandfather took his walker, drain from midline incision) ?ADL Screening (condition at time of admission) ?Patient's cognitive ability adequate to safely complete daily activities?: Yes ?Is the patient deaf or have difficulty hearing?: No ?Does the patient have difficulty seeing, even when wearing glasses/contacts?: No ?Does the patient have difficulty concentrating, remembering, or making decisions?: No ?Patient able to express need for assistance with ADLs?: Yes ?Does the patient have difficulty dressing or bathing?: Yes (secondary to weakness) ?Independently performs ADLs?: No (secondary to weakness and not able to perform adls x several days) ?Communication: Independent ?Dressing (OT): Dependent ?Is this a change from baseline?: Change from baseline, expected to last >3 days ?Grooming: Needs assistance ?Is this a change from baseline?: Pre-admission baseline ?Feeding: Needs assistance ?Is this a change from baseline?: Pre-admission baseline ?Bathing: Dependent ?Is this a change from baseline?: Change from baseline, expected to last >3 days ?Toileting: Dependent ?Is this a change from baseline?: Change from baseline, expected to last >3days ?In/Out Bed: Dependent ?Is this a change from baseline?: Change from baseline, expected to last >3 days ?Walks in Home: Dependent ?Is this a change from baseline?: Change from baseline, expected to last >3 days ?Does the patient have difficulty walking or climbing stairs?: Yes (secondary to worsening weakness) ?Weakness of Legs: Both (L>R) ?Weakness of  Arms/Hands: Left ? ?Permission Sought/Granted ?  ?  ?   ?   ?   ?   ? ?Emotional Assessment ?Appearance:: Appears stated age ?Attitude/Demeanor/Rapport: Engaged ?Affect (typically observed): Accepting ?Orientation: : Oriented to Self, Oriented to Place, Oriented to  Time, Oriented to Situation ?  ?Psych Involvement:  No (comment) ? ?Admission diagnosis:  Anasarca [R60.1] ?Lower extremity weakness [R29.898] ?Right sided weakness [R53.1] ?Generalized weakness [R53.1] ?Patient Active Problem List  ? Diagnosis Date Noted  ? Generalized weakness 05/12/2021  ? Lower extremity weakness 05/11/2021  ? Hypokalemia 05/11/2021  ? Anemia of chronic disease 05/11/2021  ? GERD (gastroesophageal reflux disease) 05/11/2021  ? Bilateral inguinal hernia 04/23/2021  ? Protein-calorie malnutrition, severe 04/14/2021  ? Protein calorie malnutrition ([place]) 04/13/2021  ? Protein-calorie malnutrition ([place]) 04/12/2021  ? Hepatic steatosis 04/12/2021  ? Stroke [facility]) 04/11/2021  ? Small bowel obstruction ([place]) 10/13/2020  ? Anemia due to vitamin B12 deficiency 10/13/2020  ? Hypertension 01/09/2015  ? 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 ?PCP:  [name], MD ?Pharmacy:   ?[facility] [place] [address] ?[place] [address] ?Phone: [phone] Fax: [phone] ? ? ? ? ?Social Determinants of Health (SDOH) Interventions ?  ? ?Readmission Risk Interventions ? ?  05/05/2021  ? 10:01 AM  ?Readmission Risk Prevention Plan  ?Transportation Screening Complete  ?Medication Review Press photographer) Referral to Pharmacy  ?PCP or Specialist appointment within 3-5 days of discharge Complete  ?[place] or Home Care Consult Patient refused  ?SW Recovery Care/Counseling Consult Complete  ?Palliative Care Screening Not Applicable  ?Skilled Nursing Facility Complete  ? ? ? ?

## 2021-05-12 NOTE — Progress Notes (Addendum)
HOSPITAL MEDICINE OVERNIGHT EVENT NOTE   ? ?Notified by nursing that patient has had at least 4 watery stools during the shift.  This is despite administration of 2 mg of loperamide earlier in the evening. ? ?Patient is not exhibiting any concurrent fever.  Abdomen is soft.  C. difficile PCR testing as well as GI pathogen panel are unremarkable.  CT imaging of the abdomen and pelvis performed on 4/3 while revealing possible ileus did not reveal any obvious evidence of infectious process. ? ?Administering additional dose of loperamide as well as Lomotil.  The patient continues to exhibit frequent loose stools will consider initiation of Flexi-Seal to avoid skin breakdown. ? ?Vernelle Emerald  MD ?[facility]  ? ?ADDENDUM (4/6 1AM) ? ?Patient continuing to exhibit bouts of watery diarrhea despite administration of Lomotil and Imodium. ? ?Repeat nursing examination reveals the abdomen continues to be soft.  Patient is hemodynamically stable. ? ?Due to continued substantial watery bowel movements in this patient with no evidence of infectious etiology or complication we will go ahead and initiate temporary use of a Flexi-Seal.  This will be discontinued as soon as able. ? ?Continue to monitor patient closely. ? ?Sherryll Burger Eldean Nanna ? ?ADDENDUM (4/6 2:20am) ? ?Notified by nursing that patient's phosphorus this morning is less than 1.  Ordering 30 mill equivalents of intravenous phosphorus. ? ?Sherryll Burger Draydon [place] ? ? ? ? ? ? ? ? ? ? ? ? ? ?

## 2021-05-12 NOTE — Progress Notes (Addendum)
PROGRESS NOTE    Logan Nelson  JJO:[id number] DOB: 07/10/1965 DOA: 05/10/2021 PCP: [name], MD    Brief Narrative:  Eyuel Dacres is a 56 y.o. male with past medical history of severe protein calorie malnutrition, hypertension, anasarca, abdominal wall hernia presented to hospital with bilateral lower extremity weakness.  Patient was recently hospitalized for abdominal hernia with incarceration and had a open primary repair but then had left AMA before he was placed in rehab.  At this time he presented with difficulty ambulating and fall.  Patient was then brought into the hospital.    Assessment and plan  BLE weakness/RUE weakness/ambulatory dysfunction, deconditioning Thought to be secondary to deconditioning and severe malnutrition, patient will likely need rehabilitation at this time.  Seen by neurology.  There is some enhancement on the MRI scan of the spine.  Will discuss with neurosurgery about this finding.  Vitamin B1 level at 127.  We will also add vitamin B-12 and TSH levels.  Vitamin D level was low and we will continue on vitamin D supplementation.   Severe protein-calorie malnutrition Present on admission.  Continue supplementation.  Albumin at 1.8.  Will encourage oral nutrition.   Anasarca Could be secondary to severe malnutrition and low albumin   S/p open primary repair of chronically incarcerated ventral incisional hernia, primary right inguinal hernia repair without mesh, extensive lysis of adhesions on 04/30/2021. Surgery on board.  Drain has been removed.  Continue dressing as per surgery  Diarrhea. Negative C. difficile screen.  Add loperamide as needed.  GI pathogen panel pending.  HTN Blood pressure seems to be stable.  Did not see any antihypertensives on board    Anemia of chronic disease Latest hemoglobin of 9.3.  We will continue to monitor.     GERD Add Protonix    Hypokalemia Mild.  We will continue oral supplementation.  Check  potassium levels in a.m.  Hypophosphatemia. Will replenish with Kphos, will check levels in am.   Pseudohypocalcemia Secondary to hypoalbuminemia.  Ca2+ corrects to 8.9  Vitamin D deficiency noted on blood work 04/12/2021.  On vitamin D supplements as outpatient.  Will repeat vitamin D level in AM.  Might need 50,000 unit supplementation as well.     DVT prophylaxis: SCDs Start: 05/11/21 2009   Code Status:     Code Status: Full Code  Disposition: Likely to rehabilitation  Status is: Observation  The patient will require care spanning > 2 midnights and should be moved to inpatient because: Lower extremity weakness, ambulatory dysfunction, abnormal spinal MRI, possible need for rehabilitation   Family Communication: Spoke with the patient at bedside   Consultants:  Neurology,  General surgery Neurosurgery consultation  Procedures:  None  Antimicrobials:  None  Anti-infectives (From admission, onward)    None      Subjective: Today, patient was seen and examined at bedside.  Patient complains of mild pain over the lower back.  States that he is eating better.  Had multiple episodes of diarrhea.  Denies any shortness of breath chest pain, fever, abdominal pain.  Objective: Vitals:   05/12/21 0900 05/12/21 1129 05/12/21 1132 05/12/21 1455  BP:  (!) 134/93  115/87  Pulse: (!) 110 (!) 117 (!) 112 (!) 114  Resp:  20 18 14   Temp:  99 F (37.2 C) 98.9 F (37.2 C) 99.5 F (37.5 C)  TempSrc:  Oral Oral Oral  SpO2:  99%  100%  Height:        Intake/Output  Summary (Last 24 hours) at 05/12/2021 1543 Last data filed at 05/12/2021 1216 Gross per 24 hour  Intake 560 ml  Output --  Net 560 ml   There were no vitals filed for this visit.  Physical Examination: Body mass index is 22.7 kg/m.   General: Thinly built, not in obvious distress HENT:   No scleral pallor or icterus noted. Oral mucosa is moist.  Chest:  Clear breath sounds.  Diminished breath sounds  bilaterally. No crackles or wheezes.  CVS: S1 &S2 heard. No murmur.  Regular rate and rhythm. Abdomen: Soft, nontender, nondistended.  Bowel sounds are heard.  Dry and intact right groin incision Extremities: No cyanosis, clubbing or edema.  Peripheral pulses are palpable. Psych: Alert, awake and oriented, normal mood CNS:  No cranial nerve deficits.  Bilateral lower extremity mild weakness. Skin: Warm and dry.  Right groin incision clean dry and intact  Data Reviewed:   CBC: Recent Labs  Lab 05/10/21 1656 05/10/21 1703 05/11/21 1415 05/12/21 0823  WBC 9.2  --  8.6 13.2*  NEUTROABS 7.4  --  6.9  --   HGB 11.5* 12.2* 10.7* 9.3*  HCT 35.8* 36.0* 32.4* 29.1*  MCV 94.0  --  94.7 93.9  PLT 391  --  385 318    Basic Metabolic Panel: Recent Labs  Lab 05/10/21 1656 05/10/21 1703 05/11/21 1415 05/12/21 0823  NA 140 145 145 143  K 2.9* 3.0* 3.4* 3.4*  CL 110 106 114* 114*  CO2 25  --  28 24  GLUCOSE 104* 100* 92 93  BUN 16 19 16 15   CREATININE 0.58* 0.50* 0.53* 0.56*  CALCIUM 7.3*  --  7.4* 7.3*  MG 1.9  --  1.8  --   PHOS  --   --  2.0*  --     Liver Function Tests: Recent Labs  Lab 05/10/21 1656 05/11/21 1415 05/12/21 0823  AST 58* 47* 40  ALT 91* 70* 58*  ALKPHOS 166* 153* 140*  BILITOT 0.9 0.7 0.4  PROT 5.4* 5.0* 4.1*  ALBUMIN 2.0* 1.8* <1.5*     Radiology Studies: DG Abd 1 View  Result Date: 05/11/2021 CLINICAL DATA:  Distended abdomen EXAM: ABDOMEN - 1 VIEW COMPARISON:  CT 05/10/2021 FINDINGS: Dilated bowel loop in the central to right abdomen. When comparing to prior CT, this appears to reflect a small bowel loop concerning for small bowel obstruction. No organomegaly or free air. Gas seen within nondistended colon. IMPRESSION: Dilated small bowel loop in the right mid abdomen concerning for small bowel obstruction. This is similar to prior CT. Electronically Signed   By: Charlett Nose M.D.   On: 05/11/2021 21:38   CT Head Wo Contrast  Result Date:  05/10/2021 CLINICAL DATA:  Head trauma, multiple falls. EXAM: CT HEAD WITHOUT CONTRAST TECHNIQUE: Contiguous axial images were obtained from the base of the skull through the vertex without intravenous contrast. RADIATION DOSE REDUCTION: This exam was performed according to the departmental dose-optimization program which includes automated exposure control, adjustment of the mA and/or kV according to patient size and/or use of iterative reconstruction technique. COMPARISON:  CT imaging from March 5th 2023 MRI of the brain also from that same date. FINDINGS: Brain: No evidence of acute infarction, hemorrhage, hydrocephalus, extra-axial collection or mass lesion/mass effect. Vascular: No hyperdense vessel or unexpected calcification. Skull: Normal. Negative for fracture or focal lesion. Sinuses/Orbits: Visualized paranasal sinuses and orbits show no acute finding to the extent evaluated. Chronic deformity of the medial LEFT  orbit, lamina papyracea without change. Other: None. IMPRESSION: 1. No acute intracranial abnormality. Electronically Signed   By: Donzetta Kohut M.D.   On: 05/10/2021 17:53   MR BRAIN WO CONTRAST  Result Date: 05/10/2021 CLINICAL DATA:  Acute neurologic deficit EXAM: MRI HEAD WITHOUT CONTRAST TECHNIQUE: Multiplanar, multiecho pulse sequences of the brain and surrounding structures were obtained without intravenous contrast. COMPARISON:  04/11/2021 FINDINGS: Brain: No acute infarct, mass effect or extra-axial collection. No acute or chronic hemorrhage. There is multifocal hyperintense T2-weighted signal within the white matter. Generalized volume loss without a clear lobar predilection. The midline structures are normal. Vascular: Major flow voids are preserved. Skull and upper cervical spine: Normal calvarium and skull base. Visualized upper cervical spine and soft tissues are normal. Sinuses/Orbits:No paranasal sinus fluid levels or advanced mucosal thickening. No mastoid or middle ear  effusion. Normal orbits. IMPRESSION: 1. No acute intracranial abnormality. 2. Findings of chronic microvascular ischemia and generalized volume loss. Electronically Signed   By: Deatra Robinson M.D.   On: 05/10/2021 22:59   MR CERVICAL SPINE WO CONTRAST  Result Date: 05/10/2021 CLINICAL DATA:  Neck trauma, focal neuro deficit or paresthesia (Age 26-64y). EXAM: MRI CERVICAL SPINE WITHOUT CONTRAST TECHNIQUE: Multiplanar, multisequence MR imaging of the cervical spine was performed. No intravenous contrast was administered. COMPARISON:  04/11/2021 FINDINGS: The examination is severely degraded by motion. Visualization of the spinal cord is 4. C6-7 degenerative endplate changes are unchanged. There is no high-grade spinal canal stenosis. IMPRESSION: 1. Severely motion degraded examination. 2. No high-grade spinal canal stenosis. Electronically Signed   By: Deatra Robinson M.D.   On: 05/10/2021 22:25   MR CERVICAL SPINE W CONTRAST  Result Date: 05/11/2021 CLINICAL DATA:  Myelopathy, concern for epidural abscess EXAM: MRI CERVICAL SPINE WITH CONTRAST TECHNIQUE: Multiplanar, multisequence MR imaging of the cervical spine was performed following the administration of intravenous contrast. COMPARISON:  Noncontrast cervical spine MRI dated 1 day prior, cervical spine MRI 04/11/2021 FINDINGS: Image quality is degraded by motion artifact. Only axial and sagittal T1 postcontrast images were provided. Alignment: There is straightening of the normal cervical spine lordosis. There is no antero or retrolisthesis. Vertebrae: There is no suspicious enhancement. There is no evidence of discitis/osteomyelitis. There is no evidence of epidural abscess. Cord: There is no abnormal cord enhancement. Posterior Fossa, vertebral arteries, paraspinal tissues: The imaged posterior fossa is grossly unremarkable. There is enhancement in the interspinous spaces throughout the cervical spine. There is no evidence of abscess in the imaged soft  tissues. Disc levels: There is no high-grade spinal canal stenosis. IMPRESSION: 1. No suspicious enhancement or evidence of discitis/osteomyelitis or epidural abscess. 2. Enhancement of the interspinous spaces throughout the cervical spine is nonspecific but could be infectious or inflammatory in nature. Electronically Signed   By: Lesia Hausen M.D.   On: 05/11/2021 12:06   MR THORACIC SPINE W WO CONTRAST  Addendum Date: 05/11/2021   ADDENDUM REPORT: 05/11/2021 12:12 ADDENDUM: There is smooth enhancement along the dorsal aspect of the canal extending through much of the T spine (best seen on sagittal image 28-9). The appearance is not typical for epidural abscess or phlegmon, but this cannot be entirely excluded. There is mild edema in the right facet joint at T4-T5 without osseous destruction to suggest septic arthritis, favored degenerative in nature (22-6). Consider follow up imaging in 2-3 days to reassess the intraspinal findings. Diffuse soft tissue edema is likely related to anasarca. This addendum will be called to the ordering clinician or representative  by the Radiologist Assistant, and communication documented in the PACS or [employer]. Electronically Signed   By: Lesia Hausen M.D.   On: 05/11/2021 12:12   Result Date: 05/11/2021 CLINICAL DATA:  Epidural abscess suspected. Multiple falls, history of CVA, myelopathy EXAM: MRI THORACIC WITHOUT AND WITH CONTRAST TECHNIQUE: Multiplanar and multiecho pulse sequences of the thoracic spine were obtained without and with intravenous contrast. CONTRAST:  6.58mL GADAVIST GADOBUTROL 1 MMOL/ML IV SOLN, 6mL GADAVIST GADOBUTROL 1 MMOL/ML IV SOLN COMPARISON:  CT chest 05/10/2021 FINDINGS: Image quality is markedly degraded by motion artifact. Alignment:  Normal. Vertebrae: The sagittal T1 precontrast images are fat saturated, limiting evaluation of background marrow signal. Wedge-shaped T2 hyperintensity along the T9 and T11 inferior endplates is most likely  degenerative in nature when correlated with the appearance on prior CT. There is no abnormal enhancement of the marrow. There is no evidence of discitis/osteomyelitis. There is no evidence of epidural abscess. Cord: The cord is grossly normal in signal and morphology, within the confines of motion degraded images. There is no definite abnormal cord enhancement. Paraspinal and other soft tissues: Upper abdominal ascites is noted. Patchy signal abnormality in the lung bases, left more than right is noted, likely reflecting the small bilateral pleural effusions and adjacent airspace disease seen on prior CT. Disc levels: There is mild multilevel degenerative endplate change and facet arthropathy. There is no evidence of high-grade spinal canal or neural foraminal stenosis. There is no evidence of cord or nerve root compression. IMPRESSION: 1. Motion degraded study. Within this confine, no evidence of acute abnormality in the thoracic spine. No evidence of discitis/osteomyelitis or epidural abscess. 2. Partially imaged ascites and bilateral pleural effusions with adjacent airspace disease. Electronically Signed: By: Lesia Hausen M.D. On: 05/11/2021 11:34   CT CHEST ABDOMEN PELVIS W CONTRAST  Result Date: 05/10/2021 CLINICAL DATA:  Multiple falls;  ventral hernia repair 04/30/2021 EXAM: CT CHEST, ABDOMEN, AND PELVIS WITH CONTRAST TECHNIQUE: Multidetector CT imaging of the chest, abdomen and pelvis was performed following the standard protocol during bolus administration of intravenous contrast. RADIATION DOSE REDUCTION: This exam was performed according to the departmental dose-optimization program which includes automated exposure control, adjustment of the mA and/or kV according to patient size and/or use of iterative reconstruction technique. CONTRAST:  OMNIPAQUE IOHEXOL 300 MG/ML  SOLN COMPARISON:  CT abdomen and pelvis dated December 17, 2020 FINDINGS: CT CHEST FINDINGS Cardiovascular: Normal heart size. No  pericardial effusion. No significant atherosclerotic disease of the thoracic aorta. No suspicious filling defects of the central pulmonary arteries. Mediastinum/Nodes: Thyroid and esophagus are unremarkable. No pathologically enlarged lymph nodes seen in the chest. Lungs/Pleura: Central airways are patent. Mild paraseptal emphysema. Linear opacities of the left greater than right lower lungs, likely due to scarring or atelectasis. Small left Bochdalek hernia. Musculoskeletal: No chest wall mass or suspicious bone lesions identified. CT ABDOMEN PELVIS FINDINGS Hepatobiliary: Hepatic steatosis. No focal liver abnormalities. Hydropic gallbladder, no gallbladder wall thickening. No biliary ductal dilation. Pancreas: Unremarkable. No pancreatic ductal dilatation or surrounding inflammatory changes. Spleen: Normal in size without focal abnormality. Adrenals/Urinary Tract: Adrenal glands are unremarkable. Kidneys are normal, without renal calculi, focal lesion, or hydronephrosis. Bladder is unremarkable. Stomach/Bowel: Bowel loops are not well visualized due severe anasarca and streak artifact, within limitations, dilated gas-filled loops of small bowel are seen. Large bowel is normal in caliber. Vascular/Lymphatic: Aortic atherosclerosis. No enlarged abdominal or pelvic lymph nodes. Reproductive: Prostate is unremarkable. Other: Severe diffuse soft tissue anasarca. Postsurgical changes of  ventral abdominal hernia repair with soft tissue drain is seen in the anterior abdomen. Musculoskeletal: No acute or significant osseous findings. IMPRESSION: 1. Gas-filled dilated loops of small bowel are seen with no definite transition point, evaluation of the bowel is somewhat limited due to anasarca and streak artifact. Differential considerations include ileus or early or partial small-bowel obstruction. 2. New small volume abdominal ascites. 3. Hydropic gallbladder with no gallbladder wall thickening, if there are symptoms of  right upper quadrant pain finding could be further evaluated with gallbladder ultrasound. 4. Severe anasarca. 5. Hepatic steatosis. 6. Aortic Atherosclerosis (ICD10-I70.0). Electronically Signed   By: Allegra Lai M.D.   On: 05/10/2021 18:10      LOS: 0 days    Joycelyn Das, MD [facility] 05/12/2021, 3:43 PM

## 2021-05-12 NOTE — Evaluation (Signed)
Physical Therapy Evaluation ?Patient Details ?Name: Marcellus Musumeci ?MRN: [id number] ?DOB: 1965-06-03 ?Today's Date: 05/12/2021 ? ?History of Present Illness ? pt is a 77 admitted 4/3 with quickly progressing B LE weakness.  Recently hospitalize with incarcerated abdominal hernia, was repaired, but pt left AMA before he was safe to mobilize,  MRI of brain, cervical/thoracic spine inconclusive.  Work up continues.  PMHx: anemia, arthritis, dyspnea, GERD, HTN, LE edema  ?Clinical Impression ? Pt admitted with/for bil LE weakness, work up for full cause continues.  Pt needing moderate assist for basic mobility and can not ambulate yet.Marland Kitchen  Pt currently limited functionally due to the problems listed. ( See problems list.)   Pt will benefit from PT to maximize function and safety in order to get ready for next venue listed below. ?   ?   ? ?Recommendations for follow up therapy are one component of a multi-disciplinary discharge planning process, led by the attending physician.  Recommendations may be updated based on patient status, additional functional criteria and insurance authorization. ? ?Follow Up Recommendations Acute inpatient rehab (3hours/day) ? ?  ?Assistance Recommended at Discharge Intermittent Supervision/Assistance  ?Patient can return home with the following ? A lot of help with walking and/or transfers;A lot of help with bathing/dressing/bathroom;Assistance with cooking/housework;Assist for transportation;Help with stairs or ramp for entrance;Direct supervision/assist for medications management ? ?  ?Equipment Recommendations Rolling walker (2 wheels)  ?Recommendations for Other Services ? Rehab consult  ?  ?Functional Status Assessment Patient has had a recent decline in their functional status and demonstrates the ability to make significant improvements in function in a reasonable and predictable amount of time.  ? ?  ?Precautions / Restrictions Precautions ?Precautions: Fall  ? ?  ? ?Mobility ? Bed  Mobility ?Overal bed mobility: Needs Assistance ?Bed Mobility: Supine to Sit ?  ?  ?Supine to sit: Mod assist ?  ?  ?  ?  ? ?Transfers ?Overall transfer level: Needs assistance ?Equipment used: None ?Transfers: Sit to/from Stand, Bed to chair/wheelchair/BSC ?Sit to Stand: Mod assist ?  ?  ?Squat pivot transfers: Mod assist ?  ?  ?General transfer comment: face to face sit to assist forward and to boost.  Once upright, pt able to maintain weak control over Bil knees ?  ? ?Ambulation/Gait ?  ?  ?  ?  ?  ?  ?  ?General Gait Details: not able ? ?Stairs ?  ?  ?  ?  ?  ? ?Wheelchair Mobility ?  ? ?Modified Rankin (Stroke Patients Only) ?  ? ?  ? ?Balance Overall balance assessment: Needs assistance ?Sitting-balance support: Single extremity supported, Bilateral upper extremity supported, Feet supported ?Sitting balance-Leahy Scale: Fair ?Sitting balance - Comments: fair for short period, but preferring to use UE's ?  ?Standing balance support: Bilateral upper extremity supported ?Standing balance-Leahy Scale: Poor ?Standing balance comment: mod face to face support due to weakness and LE incoordination. ?  ?  ?  ?  ?  ?  ?  ?  ?  ?  ?  ?   ? ? ? ?Pertinent Vitals/Pain Pain Assessment ?Pain Assessment: Faces ?Faces Pain Scale: Hurts a little bit ?Pain Location: generalized ?Pain Descriptors / Indicators: Discomfort ?Pain Intervention(s): Monitored during session  ? ? ?Home Living Family/patient expects to be discharged to:: Unsure ?Living Arrangements: Other relatives ?Available Help at Discharge: Available 24 hours/day;Family ?Type of Home: Apartment ?  ?  ?  ?  ?Home Layout: One level ?Home Equipment: Kasandra Knudsen -  single point;Rolling Walker (2 wheels) ?Additional Comments: pt stated he lived with grandfather who is more infirm and can not handle him at home.  ?  ?Prior Function Prior Level of Function : Needs assist ?  ?  ?  ?  ?  ?  ?Mobility Comments: intermittently uses SPC, stating he uses the cane outside. ?ADLs  Comments: Patient stated he is having increased difficulty "getting around", and can't transfer into the tub/shower.  Attempts sink baths when he is able. ?  ? ? ?Hand Dominance  ? Dominant Hand: Right ? ?  ?Extremity/Trunk Assessment  ? Upper Extremity Assessment ?Upper Extremity Assessment: Generalized weakness;RUE deficits/detail ?RUE Deficits / Details: uncoordinated, numb and pt not able to tell how his arm/hand is positioned without cues.Marland Kitchengeneral weakness, but moves laboriously against gravity. ?RUE Sensation: decreased light touch;decreased proprioception ?RUE Coordination: decreased fine motor;decreased gross motor ?LUE Deficits / Details: generally weak overall at >=3/5 ?LUE Coordination: decreased fine motor ?  ? ?Lower Extremity Assessment ?Lower Extremity Assessment: Generalized weakness (L LE similar to R LE) ?RLE Deficits / Details: R LE similar to L LE,  gross extention 3/5, otherwise no better than 3-/5 overall. ?RLE Sensation: decreased light touch;decreased proprioception ?RLE Coordination: decreased fine motor;decreased gross motor ?  ? ?Cervical / Trunk Assessment ?Cervical / Trunk Assessment: Kyphotic  ?Communication  ? Communication: Expressive difficulties  ?Cognition Arousal/Alertness: Awake/alert ?Behavior During Therapy: [facility] for tasks assessed/performed, Agitated ?Overall Cognitive Status: Impaired/Different from baseline (NT formally, but pt not a good historian, slow to follow instruction.) ?  ?  ?  ?  ?  ?  ?  ?  ?  ?  ?  ?  ?  ?  ?  ?  ?  ?  ?  ? ?  ?General Comments General comments (skin integrity, edema, etc.): SpO2 94% on RA ? ?  ?Exercises    ? ?Assessment/Plan  ?  ?PT Assessment Patient needs continued PT services  ?PT Problem List Decreased strength;Decreased activity tolerance;Decreased balance;Decreased mobility;Decreased cognition;Decreased knowledge of use of DME ? ?   ?  ?PT Treatment Interventions DME instruction;Gait training;Functional mobility training;Therapeutic  activities;Therapeutic exercise;Balance training;Cognitive remediation;Patient/family education;Stair training   ? ?PT Goals (Current goals can be found in the Care Plan section)  ?Acute Rehab PT Goals ?Patient Stated Goal: return to independence ?PT Goal Formulation: With patient ?Time For Goal Achievement: 05/26/21 ?Potential to Achieve Goals: Good ? ?  ?Frequency Min 3X/week ?  ? ? ?Co-evaluation   ?  ?  ?  ?  ? ? ?  ?AM-PAC PT "6 Clicks" Mobility  ?Outcome Measure Help needed turning from your back to your side while in a flat bed without using bedrails?: A Lot ?Help needed moving from lying on your back to sitting on the side of a flat bed without using bedrails?: A Lot ?Help needed moving to and from a bed to a chair (including a wheelchair)?: A Lot ?Help needed standing up from a chair using your arms (e.g., wheelchair or bedside chair)?: A Lot ?Help needed to walk in hospital room?: Total ?Help needed climbing 3-5 steps with a railing? : Total ?6 Click Score: 10 ? ?  ?End of Session Equipment Utilized During Treatment: Gait belt ?Activity Tolerance: Patient limited by pain;Patient limited by fatigue ?Patient left: in chair;with call bell/phone within reach ?Nurse Communication: Mobility status ?PT Visit Diagnosis: Other abnormalities of gait and mobility (R26.89);Muscle weakness (generalized) (M62.81);History of falling (Z91.81);Pain ?Pain - part of body: Leg;Ankle and joints of  foot (pain as if stretching) ?  ? ?Time: [id number] ?PT Time Calculation (min) (ACUTE ONLY): 31 min ? ? ?Charges:   PT Evaluation ?$PT Eval Moderate Complexity: 1 Mod ?PT Treatments ?$Therapeutic Activity: 8-22 mins ?  ?   ? ? ?05/12/2021 ? ?Ginger Carne., PT ?Acute Rehabilitation Services ?[phone]  (pager) ?[phone]  (office) ? ?Tessie Fass Felicita Nuncio ?05/12/2021, 2:26 PM ? ?

## 2021-05-12 NOTE — Hospital Course (Addendum)
Logan Nelson is a 56 y.o. male with past medical history of severe protein calorie malnutrition, hypertension, anasarca, abdominal wall hernia presented to the hospital with multiple falls and swelling. ?Found to have failure to thrive with protein calorie malnutrition currently improving. ?Now septic with MSSA bacteremia.  ID following.  Will be undergoing MRI of the spine and sacrum to rule out osteomyelitis. ?Echo also shows worsening EF.  No vegetation. ?

## 2021-05-12 NOTE — Progress Notes (Signed)
Inpatient Rehabilitation Admissions Coordinator  ? ?I am familiar with patient from previous admission. I spoke on 3/28 with his NOK, Antelope by phone. Deanna Artis is the mother of his 58 year old child and can not offer housing. Patient was living with Keisha's stepfather since last Spring and stated he no longer could care for him or live with him. SNF was requested. Patient left AMA on 05/05/21. Patient is not a candidate for CIR for he has no caregiver supports at home and is essentially homeless per [place]. We will not pursue Cir at this time. ? ?Ottie Glazier, RN, MSN ?Rehab Admissions Coordinator ?[phone] ?05/12/2021 2:35 PM ? ?

## 2021-05-12 NOTE — Discharge Summary (Signed)
[facility] ?Discharge Summary  ? ?Patient ID: ?Logan Nelson ?MRN: [id number] ?DOB/AGE: Jun 24, 1965 56 y.o. ? ?Admit date: 04/23/2021 ?Discharge date: Left AMA 05/05/21 ? ?Admitting Diagnosis: ?Ventral hernia  ?Severe protein calorie malnutrition  ?Anemia of chronic disease ?Physical deconditioning ?HTN ?Agitation ? ?Discharge Diagnosis ?S/P complex ventral hernia repair with extensive LOA ?Severe protein calorie malnutrition  ?Anemia of chronic disease ?Physical deconditioning ?HTN ?Agitation ? ?Consultants ?Critical care  ? ?Imaging: ?DG Abd 1 View ? ?Result Date: 05/11/2021 ?CLINICAL DATA:  Distended abdomen EXAM: ABDOMEN - 1 VIEW COMPARISON:  CT 05/10/2021 FINDINGS: Dilated bowel loop in the central to right abdomen. When comparing to prior CT, this appears to reflect a small bowel loop concerning for small bowel obstruction. No organomegaly or free air. Gas seen within nondistended colon. IMPRESSION: Dilated small bowel loop in the right mid abdomen concerning for small bowel obstruction. This is similar to prior CT. Electronically Signed   By: Rolm Baptise M.D.   On: 05/11/2021 21:38  ? ?CT Head Wo Contrast ? ?Result Date: 05/10/2021 ?CLINICAL DATA:  Head trauma, multiple falls. EXAM: CT HEAD WITHOUT CONTRAST TECHNIQUE: Contiguous axial images were obtained from the base of the skull through the vertex without intravenous contrast. RADIATION DOSE REDUCTION: This exam was performed according to the departmental dose-optimization program which includes automated exposure control, adjustment of the mA and/or kV according to patient size and/or use of iterative reconstruction technique. COMPARISON:  CT imaging from March 5th 2023 MRI of the brain also from that same date. FINDINGS: Brain: No evidence of acute infarction, hemorrhage, hydrocephalus, extra-axial collection or mass lesion/mass effect. Vascular: No hyperdense vessel or unexpected calcification. Skull: Normal. Negative for fracture or focal  lesion. Sinuses/Orbits: Visualized paranasal sinuses and orbits show no acute finding to the extent evaluated. Chronic deformity of the medial LEFT orbit, lamina papyracea without change. Other: None. IMPRESSION: 1. No acute intracranial abnormality. Electronically Signed   By: Zetta Bills M.D.   On: 05/10/2021 17:53  ? ?MR BRAIN WO CONTRAST ? ?Result Date: 05/10/2021 ?CLINICAL DATA:  Acute neurologic deficit EXAM: MRI HEAD WITHOUT CONTRAST TECHNIQUE: Multiplanar, multiecho pulse sequences of the brain and surrounding structures were obtained without intravenous contrast. COMPARISON:  04/11/2021 FINDINGS: Brain: No acute infarct, mass effect or extra-axial collection. No acute or chronic hemorrhage. There is multifocal hyperintense T2-weighted signal within the white matter. Generalized volume loss without a clear lobar predilection. The midline structures are normal. Vascular: Major flow voids are preserved. Skull and upper cervical spine: Normal calvarium and skull base. Visualized upper cervical spine and soft tissues are normal. Sinuses/Orbits:No paranasal sinus fluid levels or advanced mucosal thickening. No mastoid or middle ear effusion. Normal orbits. IMPRESSION: 1. No acute intracranial abnormality. 2. Findings of chronic microvascular ischemia and generalized volume loss. Electronically Signed   By: Ulyses Jarred M.D.   On: 05/10/2021 22:59  ? ?MR CERVICAL SPINE WO CONTRAST ? ?Result Date: 05/10/2021 ?CLINICAL DATA:  Neck trauma, focal neuro deficit or paresthesia (Age 96-64y). EXAM: MRI CERVICAL SPINE WITHOUT CONTRAST TECHNIQUE: Multiplanar, multisequence MR imaging of the cervical spine was performed. No intravenous contrast was administered. COMPARISON:  04/11/2021 FINDINGS: The examination is severely degraded by motion. Visualization of the spinal cord is 4. C6-7 degenerative endplate changes are unchanged. There is no high-grade spinal canal stenosis. IMPRESSION: 1. Severely motion degraded  examination. 2. No high-grade spinal canal stenosis. Electronically Signed   By: Ulyses Jarred M.D.   On: 05/10/2021 22:25  ? ?MR CERVICAL SPINE W  CONTRAST ? ?Result Date: 05/11/2021 ?CLINICAL DATA:  Myelopathy, concern for epidural abscess EXAM: MRI CERVICAL SPINE WITH CONTRAST TECHNIQUE: Multiplanar, multisequence MR imaging of the cervical spine was performed following the administration of intravenous contrast. COMPARISON:  Noncontrast cervical spine MRI dated 1 day prior, cervical spine MRI 04/11/2021 FINDINGS: Image quality is degraded by motion artifact. Only axial and sagittal T1 postcontrast images were provided. Alignment: There is straightening of the normal cervical spine lordosis. There is no antero or retrolisthesis. Vertebrae: There is no suspicious enhancement. There is no evidence of discitis/osteomyelitis. There is no evidence of epidural abscess. Cord: There is no abnormal cord enhancement. Posterior Fossa, vertebral arteries, paraspinal tissues: The imaged posterior fossa is grossly unremarkable. There is enhancement in the interspinous spaces throughout the cervical spine. There is no evidence of abscess in the imaged soft tissues. Disc levels: There is no high-grade spinal canal stenosis. IMPRESSION: 1. No suspicious enhancement or evidence of discitis/osteomyelitis or epidural abscess. 2. Enhancement of the interspinous spaces throughout the cervical spine is nonspecific but could be infectious or inflammatory in nature. Electronically Signed   By: Valetta Mole M.D.   On: 05/11/2021 12:06  ? ?MR THORACIC SPINE W WO CONTRAST ? ?Addendum Date: 05/11/2021   ?ADDENDUM REPORT: 05/11/2021 12:12 ADDENDUM: There is smooth enhancement along the dorsal aspect of the canal extending through much of the T spine (best seen on sagittal image 28-9). The appearance is not typical for epidural abscess or phlegmon, but this cannot be entirely excluded. There is mild edema in the right facet joint at T4-T5 without  osseous destruction to suggest septic arthritis, favored degenerative in nature (22-6). Consider follow up imaging in 2-3 days to reassess the intraspinal findings. Diffuse soft tissue edema is likely related to anasarca. This addendum will be called to the ordering clinician or representative by the Radiologist Assistant, and communication documented in the PACS or [employer]. Electronically Signed   By: Valetta Mole M.D.   On: 05/11/2021 12:12  ? ?Result Date: 05/11/2021 ?CLINICAL DATA:  Epidural abscess suspected. Multiple falls, history of CVA, myelopathy EXAM: MRI THORACIC WITHOUT AND WITH CONTRAST TECHNIQUE: Multiplanar and multiecho pulse sequences of the thoracic spine were obtained without and with intravenous contrast. CONTRAST:  6.71mL GADAVIST GADOBUTROL 1 MMOL/ML IV SOLN, 55mL GADAVIST GADOBUTROL 1 MMOL/ML IV SOLN COMPARISON:  CT chest 05/10/2021 FINDINGS: Image quality is markedly degraded by motion artifact. Alignment:  Normal. Vertebrae: The sagittal T1 precontrast images are fat saturated, limiting evaluation of background marrow signal. Wedge-shaped T2 hyperintensity along the T9 and T11 inferior endplates is most likely degenerative in nature when correlated with the appearance on prior CT. There is no abnormal enhancement of the marrow. There is no evidence of discitis/osteomyelitis. There is no evidence of epidural abscess. Cord: The cord is grossly normal in signal and morphology, within the confines of motion degraded images. There is no definite abnormal cord enhancement. Paraspinal and other soft tissues: Upper abdominal ascites is noted. Patchy signal abnormality in the lung bases, left more than right is noted, likely reflecting the small bilateral pleural effusions and adjacent airspace disease seen on prior CT. Disc levels: There is mild multilevel degenerative endplate change and facet arthropathy. There is no evidence of high-grade spinal canal or neural foraminal stenosis. There is  no evidence of cord or nerve root compression. IMPRESSION: 1. Motion degraded study. Within this confine, no evidence of acute abnormality in the thoracic spine. No evidence of discitis/osteomyelitis or epidural

## 2021-05-12 NOTE — Progress Notes (Signed)
Patient ID: Logan Nelson, male   DOB: Sep 13, 1965, 56 y.o.   MRN: [id number] ?BP (!) 134/93 (BP Location: Right Arm)   Pulse (!) 112   Temp 98.9 ?F (37.2 ?C) (Oral)   Resp 18   Ht 5\' 6"  (1.676 m)   SpO2 99%   BMI 22.70 kg/m?  ?Films reviewed. The Cervical MRI is too motion degraded to appreciate the radiology interpretation.  ?I see nothing to believe this is an infectious process. The cord has normal signal, and is normal in size.  ?As before I am unable to correlate his MRI findings with his exam.  ?No operative indications exist.  ?

## 2021-05-12 NOTE — Evaluation (Signed)
Occupational Therapy Evaluation ?Patient Details ?Name: Logan Nelson ?MRN: [id number] ?DOB: 1965/09/08 ?Today's Date: 05/12/2021 ? ? ?History of Present Illness pt is a 56 admitted 4/3 with quickly progressing B LE weakness.  Recently hospitalize with incarcerated abdominal hernia, was repaired, but pt left AMA before he was safe to mobilize,  MRI of brain, cervical/thoracic spine inconclusive.  Work up continues.  PMHx: anemia, arthritis, dyspnea, GERD, HTN, LE edema  ? ?Clinical Impression ?  ?Pt admitted for concerns listed above. PTA pt reported that he was needing increased assist at home since last hospital admission. Pt reports that he has fallen multiple times. At this time, pt presents with weakness,balance deficits, cognitive concerns, and decreased activity tolerance. At this time, pt requires min-max A for ADL's and functional mobility. He refused any OOB tasks this session, as his food had arrived and he requested for therapy to come back at another time. OT recommending SNF to maximize pt performance and OT will follow acutely.   ?   ? ?Recommendations for follow up therapy are one component of a multi-disciplinary discharge planning process, led by the attending physician.  Recommendations may be updated based on patient status, additional functional criteria and insurance authorization.  ? ?Follow Up Recommendations ? Skilled nursing-short term rehab (<3 hours/day)  ?  ?Assistance Recommended at Discharge Frequent or constant Supervision/Assistance  ?Patient can return home with the following A lot of help with walking and/or transfers;A lot of help with bathing/dressing/bathroom;Assist for transportation;Assistance with cooking/housework ? ?  ?Functional Status Assessment ? Patient has had a recent decline in their functional status and demonstrates the ability to make significant improvements in function in a reasonable and predictable amount of time.  ?Equipment Recommendations ?  BSC/3in1;Tub/shower bench  ?  ?Recommendations for Other Services   ? ? ?  ?Precautions / Restrictions Precautions ?Precautions: Fall ?Restrictions ?Weight Bearing Restrictions: No  ? ?  ? ?Mobility Bed Mobility ?Overal bed mobility: Needs Assistance ?Bed Mobility: Supine to Sit ?  ?  ?Supine to sit: Max assist ?  ?  ?General bed mobility comments: OT provided heavy assist to bring upper body up to sitting EOB, and max A to scoot forward for feet to be on the ground ?  ? ?Transfers ?  ?  ?  ?  ?  ?  ?  ?  ?  ?  ?  ? ?  ?Balance Overall balance assessment: Needs assistance ?Sitting-balance support: Single extremity supported, Bilateral upper extremity supported, Feet supported ?Sitting balance-Leahy Scale: Fair ?Sitting balance - Comments: fair for short period, but preferring to use UE's ?  ?  ?  ?  ?  ?  ?  ?  ?  ?  ?  ?  ?  ?  ?  ?   ? ?ADL either performed or assessed with clinical judgement  ? ?ADL Overall ADL's : Needs assistance/impaired ?Eating/Feeding: Set up;Sitting ?  ?Grooming: Set up;Sitting ?  ?Upper Body Bathing: Moderate assistance;Sitting ?  ?Lower Body Bathing: Maximal assistance;Sitting/lateral leans ?  ?Upper Body Dressing : Moderate assistance;Sitting ?  ?Lower Body Dressing: Maximal assistance;Sitting/lateral leans ?  ?  ?  ?Toileting- Clothing Manipulation and Hygiene: Moderate assistance;Sitting/lateral lean ?  ?  ?  ?  ?General ADL Comments: Unable to assess OOB activity as pt refused due to dinner arriving  ? ? ? ?Vision Baseline Vision/History: 0 No visual deficits ?Ability to See in Adequate Light: 1 Impaired ?Patient Visual Report: No change from baseline ?Vision Assessment?:  No apparent visual deficits  ?   ?Perception   ?  ?Praxis   ?  ? ?Pertinent Vitals/Pain Pain Assessment ?Pain Assessment: Faces ?Faces Pain Scale: Hurts a little bit ?Pain Location: generalized ?Pain Descriptors / Indicators: Discomfort ?Pain Intervention(s): Monitored during session  ? ? ? ?Hand Dominance Right ?   ?Extremity/Trunk Assessment Upper Extremity Assessment ?Upper Extremity Assessment: Generalized weakness;RUE deficits/detail;LUE deficits/detail ?RUE Deficits / Details: uncoordinated, numb and pt not able to tell how his arm/hand is positioned without cues.Marland Kitchen.general weakness, but moves laboriously against gravity. ?RUE Sensation: decreased light touch;decreased proprioception ?RUE Coordination: decreased fine motor;decreased gross motor ?LUE Deficits / Details: generally weak overall at >=3/5 ?LUE Sensation: decreased light touch ?LUE Coordination: decreased fine motor ?  ?Lower Extremity Assessment ?Lower Extremity Assessment: Defer to PT evaluation ?  ?Cervical / Trunk Assessment ?Cervical / Trunk Assessment: Kyphotic ?  ?Communication Communication ?Communication: Expressive difficulties ?  ?Cognition Arousal/Alertness: Awake/alert ?Behavior During Therapy: [facility] for tasks assessed/performed, Agitated ?Overall Cognitive Status: Impaired/Different from baseline ?  ?  ?  ?  ?  ?  ?  ?  ?  ?  ?  ?  ?  ?  ?  ?  ?General Comments: Not tested, however pt made multiple remarks during session that don't make sense, such as he lives with his grandfather who is 58 y.o. ?  ?  ?General Comments  VSS on Ra ? ?  ?Exercises   ?  ?Shoulder Instructions    ? ? ?Home Living Family/patient expects to be discharged to:: Unsure ?Living Arrangements: Other relatives ?Available Help at Discharge: Available 24 hours/day;Family ?Type of Home: Apartment ?  ?  ?  ?Home Layout: One level ?  ?  ?Bathroom Shower/Tub: Tub/shower unit ?  ?Bathroom Toilet: Standard ?  ?  ?Home Equipment: Cane - single Librarian, academicpoint;Rolling Walker (2 wheels) ?  ?Additional Comments: pt stated he lived with grandfather (who he reports is 58 y.o.) who is more infirm and can not handle him at home. -- unsure of accuracy ?  ? ?  ?Prior Functioning/Environment Prior Level of Function : Needs assist ?  ?  ?  ?  ?  ?  ?Mobility Comments: intermittently uses SPC, stating he uses  the cane outside. ?ADLs Comments: Patient stated he is having increased difficulty "getting around", and can't transfer into the tub/shower.  Attempts sink baths when he is able. ?  ? ?  ?  ?OT Problem List: Decreased strength;Decreased range of motion;Impaired balance (sitting and/or standing);Decreased activity tolerance;Decreased knowledge of use of DME or AE;Decreased safety awareness;Decreased knowledge of precautions;Pain ?  ?   ?OT Treatment/Interventions: Self-care/ADL training;Therapeutic exercise;Balance training;Therapeutic activities;DME and/or AE instruction  ?  ?OT Goals(Current goals can be found in the care plan section) Acute Rehab OT Goals ?Patient Stated Goal: To walk again ?OT Goal Formulation: With patient ?Time For Goal Achievement: 05/26/21 ?Potential to Achieve Goals: Good ?ADL Goals ?Pt Will Perform Lower Body Bathing: with min guard assist;sitting/lateral leans;sit to/from stand ?Pt Will Perform Lower Body Dressing: with min guard assist;sitting/lateral leans;sit to/from stand ?Pt Will Transfer to Toilet: with min assist;stand pivot transfer ?Pt Will Perform Toileting - Clothing Manipulation and hygiene: with min guard assist;sitting/lateral leans;sit to/from stand ?Additional ADL Goal #1: Pt will tolerate 3 mins of dynamic standing task with min guard as a precursor for safe ADL performance.  ?OT Frequency: Min 2X/week ?  ? ?Co-evaluation   ?  ?  ?  ?  ? ?  ?AM-PAC OT "6  Clicks" Daily Activity     ?Outcome Measure Help from another person eating meals?: A Little ?Help from another person taking care of personal grooming?: A Little ?Help from another person toileting, which includes using toliet, bedpan, or urinal?: A Lot ?Help from another person bathing (including washing, rinsing, drying)?: A Lot ?Help from another person to put on and taking off regular upper body clothing?: A Little ?Help from another person to put on and taking off regular lower body clothing?: A Lot ?6 Click Score:  15 ?  ?End of Session Nurse Communication: Mobility status ? ?Activity Tolerance: Patient tolerated treatment well ?Patient left: in bed;with call bell/phone within reach;with bed alarm set ? ?OT Visit Diagnosis: Unst

## 2021-05-13 DIAGNOSIS — I10 Essential (primary) hypertension: Secondary | ICD-10-CM

## 2021-05-13 DIAGNOSIS — K219 Gastro-esophageal reflux disease without esophagitis: Secondary | ICD-10-CM

## 2021-05-13 DIAGNOSIS — E559 Vitamin D deficiency, unspecified: Secondary | ICD-10-CM | POA: Diagnosis present

## 2021-05-13 DIAGNOSIS — D638 Anemia in other chronic diseases classified elsewhere: Secondary | ICD-10-CM

## 2021-05-13 DIAGNOSIS — E43 Unspecified severe protein-calorie malnutrition: Secondary | ICD-10-CM

## 2021-05-13 DIAGNOSIS — E876 Hypokalemia: Secondary | ICD-10-CM

## 2021-05-13 LAB — CBC
HCT: 29.5 % — ABNORMAL LOW (ref 39.0–52.0)
Hemoglobin: 9.5 g/dL — ABNORMAL LOW (ref 13.0–17.0)
MCH: 30.5 pg (ref 26.0–34.0)
MCHC: 32.2 g/dL (ref 30.0–36.0)
MCV: 94.9 fL (ref 80.0–100.0)
Platelets: 324 10*3/uL (ref 150–400)
RBC: 3.11 MIL/uL — ABNORMAL LOW (ref 4.22–5.81)
RDW: 15.8 % — ABNORMAL HIGH (ref 11.5–15.5)
WBC: 6.7 10*3/uL (ref 4.0–10.5)
nRBC: 0 % (ref 0.0–0.2)

## 2021-05-13 LAB — COMPREHENSIVE METABOLIC PANEL
ALT: 52 U/L — ABNORMAL HIGH (ref 0–44)
AST: 32 U/L (ref 15–41)
Albumin: 1.5 g/dL — ABNORMAL LOW (ref 3.5–5.0)
Alkaline Phosphatase: 124 U/L (ref 38–126)
Anion gap: 4 — ABNORMAL LOW (ref 5–15)
BUN: 16 mg/dL (ref 6–20)
CO2: 24 mmol/L (ref 22–32)
Calcium: 7.3 mg/dL — ABNORMAL LOW (ref 8.9–10.3)
Chloride: 112 mmol/L — ABNORMAL HIGH (ref 98–111)
Creatinine, Ser: 0.57 mg/dL — ABNORMAL LOW (ref 0.61–1.24)
GFR, Estimated: >60 mL/min (ref >60)
Glucose, Bld: 92 mg/dL (ref 70–99)
Potassium: 3.5 mmol/L (ref 3.5–5.1)
Sodium: 140 mmol/L (ref 135–145)
Total Bilirubin: 0.5 mg/dL (ref 0.3–1.2)
Total Protein: 4.4 g/dL — ABNORMAL LOW (ref 6.5–8.1)

## 2021-05-13 LAB — MAGNESIUM: Magnesium: 1.4 mg/dL — ABNORMAL LOW (ref 1.7–2.4)

## 2021-05-13 LAB — VITAMIN D 25 HYDROXY (VIT D DEFICIENCY, FRACTURES): Vit D, 25-Hydroxy: 19.79 ng/mL — ABNORMAL LOW (ref 30–100)

## 2021-05-13 LAB — TSH: TSH: 2.975 u[IU]/mL (ref 0.350–4.500)

## 2021-05-13 LAB — VITAMIN B12: Vitamin B-12: 573 pg/mL (ref 180–914)

## 2021-05-13 LAB — PHOSPHORUS: Phosphorus: <1 mg/dL — CL (ref 2.5–4.6)

## 2021-05-13 MED ORDER — MAGNESIUM SULFATE 2 GM/50ML IV SOLN
2.0000 g | Freq: Once | INTRAVENOUS | Status: AC
  Administered 2021-05-13: 2 g via INTRAVENOUS
  Filled 2021-05-13: qty 50

## 2021-05-13 MED ORDER — OXYCODONE-ACETAMINOPHEN 5-325 MG PO TABS
1.0000 | ORAL_TABLET | Freq: Four times a day (QID) | ORAL | Status: DC | PRN
  Administered 2021-05-13 – 2021-05-24 (×25): 2 via ORAL
  Filled 2021-05-13: qty 1
  Filled 2021-05-13 (×11): qty 2
  Filled 2021-05-13: qty 1
  Filled 2021-05-13 (×15): qty 2

## 2021-05-13 MED ORDER — SODIUM PHOSPHATES 45 MMOLE/15ML IV SOLN
30.0000 mmol | Freq: Once | INTRAVENOUS | Status: AC
  Administered 2021-05-13: 30 mmol via INTRAVENOUS
  Filled 2021-05-13: qty 10

## 2021-05-13 NOTE — Progress Notes (Addendum)
?PROGRESS NOTE ? ? ? ?Logan Nelson  [id number] DOB: August 15, 1965 DOA: 05/10/2021 ?PCP: [name], MD  ? ? ?Brief Narrative:  ?Logan Nelson is a 56 y.o. male with past medical history of severe protein calorie malnutrition, hypertension, anasarca, abdominal wall hernia presented to hospital with bilateral lower extremity weakness.  Patient was recently hospitalized for abdominal hernia with incarceration and had a open primary repair but then had left AMA before he was placed in rehab.  Patient was admitted this time with ambulatory dysfunction and bilateral lower extremity weakness. ? ?Assessment and plan ? ?Principal Problem: ?  Lower extremity weakness ?Active Problems: ?  Hypertension ?  Protein-calorie malnutrition, severe ?  Hypokalemia ?  Anemia of chronic disease ?  GERD (gastroesophageal reflux disease) ?  Generalized weakness ?  Hypophosphatemia ?  Hypomagnesemia ?  Vitamin D deficiency ?  ? ?BLE weakness/RUE weakness/ambulatory dysfunction, deconditioning ?Thought to be secondary to deconditioning and severe malnutrition. Patient will likely need rehabilitation at this time.  Seen by neurology.  There is some enhancement on the MRI scan of the spine. Spoke with neurosurgery about this finding.  Looks like nonspecific finding at this time Vitamin B1 level at 127.  Vitamin B12 at 573.   TSH at 2.9.  Vitamin D level very low at 19.7.  We will continue with high-dose of vitamin D supplementation.   ?  ?Severe protein-calorie malnutrition ?Present on admission.  Continue supplementation.  Albumin at 1.8.  Will encourage oral nutrition.  Added Ensure supplements. ?  ?Anasarca ?Could be secondary to severe malnutrition and low albumin.  Encouraged oral intake. ?  ?S/p open primary repair of chronically incarcerated ventral incisional hernia, primary right inguinal hernia repair without mesh, extensive lysis of adhesions on 04/30/2021. ?Surgery on board.  Drain has been removed.  Continue  dressing as per surgery ? ?Diarrhea. ?Negative C. difficile screen.  On Lomotil and loperamide.  1 bowel movement this morning.  Could not hold Flexi-Seal.  GI pathogen panel was negative. ? ?HTN ?Blood pressure seems to be stable.  Did not see any antihypertensives on board  ?  ?Anemia of chronic disease ?Latest hemoglobin of 9.5.  We will continue to monitor. ?    ?GERD ?On Protonix. ?  ?Hypokalemia ?Replenished.  Latest potassium 3.5 ? ?Hypophosphatemia.  Less than 1 this morning.  Continue K-Phos IV.  Chances of refeeding syndrome.  We will closely monitor electrolytes. ? ?Pseudohypocalcemia ?Secondary to hypoalbuminemia.  Ca2+ corrects to 8.9 ? ?Vitamin D deficiency .  On vitamin D supplements as outpatient.  Repeat vitamin D level at 1019.  We will initiate 50,000 unit vitamin D while in the hospital  ? ?Hypomagnesemia.  We will replace with IV magnesium sulfate.   ? ? ? DVT prophylaxis: SCDs Start: 05/11/21 2009 ? ? ?Code Status:   ?  Code Status: Full Code ? ?Disposition: Likely to rehabilitation ? ?Status is: Inpatient ? ?The patient is inpatient because: Multiple electrolyte abnormalities, ambulatory dysfunction, need for rehabilitation ? ? Family Communication:  ?Spoke with the patient at bedside  ? ?Consultants:  ?Neurology,  ?General surgery ?Neurosurgery  ? ?Procedures:  ?None ? ?Antimicrobials:  ?None ? ?Anti-infectives (From admission, onward)  ? ? None  ? ?  ? ?Subjective: ?Today, patient was seen and examined at bedside.  Complains of lower back pain.  Has had 1 bowel movement this morning which was loose.  Could not hold Flexi-Seal.  No nausea vomiting.  Has been trying to eat better.  Denies shortness of breath cough.   ? ?Objective: ?Vitals:  ? 05/12/21 2032 05/12/21 2332 05/13/21 0347 05/13/21 0741  ?BP: 118/79 111/81 123/86 116/88  ?Pulse: 100 100 (!) 102 (!) 102  ?Resp: 16 16 16 16   ?Temp: 99 ?F (37.2 ?C) 98.6 ?F (37 ?C) 98.5 ?F (36.9 ?C) 98.4 ?F (36.9 ?C)  ?TempSrc: Oral Oral Oral Oral   ?SpO2: 97% 100% 98% 97%  ?Height:      ? ? ?Intake/Output Summary (Last 24 hours) at 05/13/2021 0937 ?Last data filed at 05/12/2021 1700 ?Gross per 24 hour  ?Intake 560 ml  ?Output 900 ml  ?Net -340 ml  ? ?There were no vitals filed for this visit. ? ?Physical Examination: ?Body mass index is 22.7 kg/m?.  ? ?General: Thinly built, not in obvious distress ?HENT:   No scleral pallor or icterus noted. Oral mucosa is moist.  ?Chest:  Clear breath sounds.  Diminished breath sounds bilaterally. No crackles or wheezes.  ?CVS: S1 &S2 heard. No murmur.  Regular rate and rhythm. ?Abdomen: Soft, nontender, nondistended.  Bowel sounds are heard.   ?Extremities: No cyanosis, clubbing or edema.  Peripheral pulses are palpable.  Right groin incision intact. ?Psych: Alert, awake and oriented, normal mood ?CNS:  No cranial nerve deficits.  Lower extremity weakness noted. ?Skin: Warm and dry.  Clean dry and intact. ? ? ?Data Reviewed:  ? ?CBC: ?Recent Labs  ?Lab 05/10/21 ?1656 05/10/21 ?1703 05/11/21 ?1415 05/12/21 ?[id number] 05/13/21 ?0051  ?WBC 9.2  --  8.6 13.2* 6.7  ?NEUTROABS 7.4  --  6.9  --   --   ?HGB 11.5* 12.2* 10.7* 9.3* 9.5*  ?HCT 35.8* 36.0* 32.4* 29.1* 29.5*  ?MCV 94.0  --  94.7 93.9 94.9  ?PLT 391  --  385 318 324  ? ? ?Basic Metabolic Panel: ?Recent Labs  ?Lab 05/10/21 ?1656 05/10/21 ?1703 05/11/21 ?1415 05/12/21 ?[id number] 05/13/21 ?0051  ?NA 140 145 145 143 140  ?K 2.9* 3.0* 3.4* 3.4* 3.5  ?CL 110 106 114* 114* 112*  ?CO2 25  --  28 24 24   ?GLUCOSE 104* 100* 92 93 92  ?BUN 16 19 16 15 16   ?CREATININE 0.58* 0.50* 0.53* 0.56* 0.57*  ?CALCIUM 7.3*  --  7.4* 7.3* 7.3*  ?MG 1.9  --  1.8  --  1.4*  ?PHOS  --   --  2.0*  --  <1.0*  ? ? ?Liver Function Tests: ?Recent Labs  ?Lab 05/10/21 ?1656 05/11/21 ?1415 05/12/21 ?[id number] 05/13/21 ?0051  ?AST 58* 47* 40 32  ?ALT 91* 70* 58* 52*  ?ALKPHOS 166* 153* 140* 124  ?BILITOT 0.9 0.7 0.4 0.5  ?PROT 5.4* 5.0* 4.1* 4.4*  ?ALBUMIN 2.0* 1.8* <1.5* 1.5*  ? ? ? ?Radiology Studies: ?DG Abd 1  View ? ?Result Date: 05/11/2021 ?CLINICAL DATA:  Distended abdomen EXAM: ABDOMEN - 1 VIEW COMPARISON:  CT 05/10/2021 FINDINGS: Dilated bowel loop in the central to right abdomen. When comparing to prior CT, this appears to reflect a small bowel loop concerning for small bowel obstruction. No organomegaly or free air. Gas seen within nondistended colon. IMPRESSION: Dilated small bowel loop in the right mid abdomen concerning for small bowel obstruction. This is similar to prior CT. Electronically Signed   By: Rolm Baptise M.D.   On: 05/11/2021 21:38  ? ?MR CERVICAL SPINE W CONTRAST ? ?Result Date: 05/11/2021 ?CLINICAL DATA:  Myelopathy, concern for epidural abscess EXAM: MRI CERVICAL SPINE WITH CONTRAST TECHNIQUE: Multiplanar, multisequence MR imaging of the cervical  spine was performed following the administration of intravenous contrast. COMPARISON:  Noncontrast cervical spine MRI dated 1 day prior, cervical spine MRI 04/11/2021 FINDINGS: Image quality is degraded by motion artifact. Only axial and sagittal T1 postcontrast images were provided. Alignment: There is straightening of the normal cervical spine lordosis. There is no antero or retrolisthesis. Vertebrae: There is no suspicious enhancement. There is no evidence of discitis/osteomyelitis. There is no evidence of epidural abscess. Cord: There is no abnormal cord enhancement. Posterior Fossa, vertebral arteries, paraspinal tissues: The imaged posterior fossa is grossly unremarkable. There is enhancement in the interspinous spaces throughout the cervical spine. There is no evidence of abscess in the imaged soft tissues. Disc levels: There is no high-grade spinal canal stenosis. IMPRESSION: 1. No suspicious enhancement or evidence of discitis/osteomyelitis or epidural abscess. 2. Enhancement of the interspinous spaces throughout the cervical spine is nonspecific but could be infectious or inflammatory in nature. Electronically Signed   By: Valetta Mole M.D.   On:  05/11/2021 12:06  ? ?MR THORACIC SPINE W WO CONTRAST ? ?Addendum Date: 05/11/2021   ?ADDENDUM REPORT: 05/11/2021 12:12 ADDENDUM: There is smooth enhancement along the dorsal aspect of the canal extending through much of the T

## 2021-05-13 NOTE — Progress Notes (Signed)
Physical Therapy Treatment ?Patient Details ?Name: Logan Nelson ?MRN: [id number] ?DOB: [birth year] ?Today's Date: 05/13/2021 ? ? ?History of Present Illness pt is a 58 admitted 4/3 with quickly progressing B LE weakness.  Recently hospitalize with incarcerated abdominal hernia, was repaired, but pt left AMA before he was safe to mobilize,  MRI of brain, cervical/thoracic spine inconclusive.  Work up continues.  PMHx: anemia, arthritis, dyspnea, GERD, HTN, LE edema ? ?  ?PT Comments  ? ? Pt was seen for mobility and immediately asked to use BSC after being on bedpan.  Pt continues to have low control of his GI symptoms, and had large watery bowel movement after sitting.  Pt is wearing a catheter, and was able to control his use of cath during mobility.  Follow up with pt to work on standing tolerance, to work on LE strength, and endurance of standing as his medical condition will permit.  Will encourage efforts to be OOB and working on greater standing times.  ?Recommendations for follow up therapy are one component of a multi-disciplinary discharge planning process, led by the attending physician.  Recommendations may be updated based on patient status, additional functional criteria and insurance authorization. ? ?Follow Up Recommendations ? Acute inpatient rehab (3hours/day) ?  ?  ?Assistance Recommended at Discharge Intermittent Supervision/Assistance  ?Patient can return home with the following A lot of help with walking and/or transfers;A lot of help with bathing/dressing/bathroom;Assistance with cooking/housework;Direct supervision/assist for medications management;Direct supervision/assist for financial management;Assist for transportation;Help with stairs or ramp for entrance ?  ?Equipment Recommendations ? Rolling walker (2 wheels)  ?  ?Recommendations for Other Services Rehab consult ? ? ?  ?Precautions / Restrictions Precautions ?Precautions: Fall ?Precaution Comments: R side weak, very  incontinent ?Restrictions ?Weight Bearing Restrictions: No  ?  ? ?Mobility ? Bed Mobility ?Overal bed mobility: Needs Assistance ?Bed Mobility: Supine to Sit, Sit to Supine ?  ?  ?Supine to sit: Mod assist ?Sit to supine: Mod assist ?  ?  ?  ? ?Transfers ?Overall transfer level: Needs assistance ?Equipment used: None ?Transfers: Sit to/from Stand, Bed to chair/wheelchair/BSC ?Sit to Stand: Mod assist ?  ?Step pivot transfers: Min assist ?  ?  ?  ?General transfer comment: refused to use walker when asked to transfer ?  ? ?Ambulation/Gait ?  ?  ?  ?  ?  ?  ?  ?  ? ? ?Stairs ?  ?  ?  ?  ?  ? ? ?Wheelchair Mobility ?  ? ?Modified Rankin (Stroke Patients Only) ?  ? ? ?  ?Balance Overall balance assessment: Needs assistance ?Sitting-balance support: Feet supported ?Sitting balance-Leahy Scale: Fair ?  ?  ?Standing balance support: Bilateral upper extremity supported ?Standing balance-Leahy Scale: Poor ?  ?  ?  ?  ?  ?  ?  ?  ?  ?  ?  ?  ?  ? ?  ?Cognition Arousal/Alertness: Awake/alert ?Behavior During Therapy: [facility] for tasks assessed/performed, Agitated ?Overall Cognitive Status: Impaired/Different from baseline ?Area of Impairment: Attention, Safety/judgement, Awareness, Problem solving ?  ?  ?  ?  ?  ?  ?  ?  ?  ?Current Attention Level: Selective ?Memory: Decreased recall of precautions ?Following Commands: Follows one step commands with increased time ?Safety/Judgement: Decreased awareness of safety, Decreased awareness of deficits ?Awareness: Intellectual ?Problem Solving: Slow processing, Requires verbal cues, Requires tactile cues ?General Comments: Pt is completely unaware of his fall risk despite being unable to stand or move alone ?  ?  ? ?  ?  Exercises   ? ?  ?General Comments General comments (skin integrity, edema, etc.): Pt is able to transfer and balance with help but asks to sit on EOB alone,agitated and angry with PT when not permitted to do this ?  ?  ? ?Pertinent Vitals/Pain Pain Assessment ?Pain  Assessment: No/denies pain  ? ? ?Home Living   ?  ?  ?  ?  ?  ?  ?  ?  ?  ?   ?  ?Prior Function    ?  ?  ?   ? ?PT Goals (current goals can now be found in the care plan section) Acute Rehab PT Goals ?Patient Stated Goal: return to independence ? ?  ?Frequency ? ? ? Min 3X/week ? ? ? ?  ?PT Plan Current plan remains appropriate  ? ? ?Co-evaluation   ?  ?  ?  ?  ? ?  ?AM-PAC PT "6 Clicks" Mobility   ?Outcome Measure ? Help needed turning from your back to your side while in a flat bed without using bedrails?: A Lot ?Help needed moving from lying on your back to sitting on the side of a flat bed without using bedrails?: A Lot ?Help needed moving to and from a bed to a chair (including a wheelchair)?: A Lot ?Help needed standing up from a chair using your arms (e.g., wheelchair or bedside chair)?: A Lot ?Help needed to walk in hospital room?: Total ?Help needed climbing 3-5 steps with a railing? : Total ?6 Click Score: 10 ? ?  ?End of Session Equipment Utilized During Treatment: Gait belt ?Activity Tolerance: Patient limited by fatigue;Treatment limited secondary to agitation ?Patient left: in bed;with call bell/phone within reach;with bed alarm set ?Nurse Communication: Mobility status ?PT Visit Diagnosis: Other abnormalities of gait and mobility (R26.89);Muscle weakness (generalized) (M62.81);History of falling (Z91.81);Pain ?  ? ? ?Time: [id number] ?PT Time Calculation (min) (ACUTE ONLY): 33 min ? ?Charges:  $Therapeutic Activity: 23-37 mins    ?Logan Nelson ?05/13/2021, 8:23 PM ? ?Logan Nelson, PT PhD ?Acute Rehab Dept. Number: [facility] [phone] and [place] [phone] ? ? ?

## 2021-05-14 DIAGNOSIS — L89302 Pressure ulcer of unspecified buttock, stage 2: Secondary | ICD-10-CM | POA: Diagnosis present

## 2021-05-14 LAB — CBC
HCT: 27.6 % — ABNORMAL LOW (ref 39.0–52.0)
Hemoglobin: 8.8 g/dL — ABNORMAL LOW (ref 13.0–17.0)
MCH: 30.2 pg (ref 26.0–34.0)
MCHC: 31.9 g/dL (ref 30.0–36.0)
MCV: 94.8 fL (ref 80.0–100.0)
Platelets: 308 10*3/uL (ref 150–400)
RBC: 2.91 MIL/uL — ABNORMAL LOW (ref 4.22–5.81)
RDW: 15.8 % — ABNORMAL HIGH (ref 11.5–15.5)
WBC: 6.4 10*3/uL (ref 4.0–10.5)
nRBC: 0 % (ref 0.0–0.2)

## 2021-05-14 LAB — BASIC METABOLIC PANEL
Anion gap: 4 — ABNORMAL LOW (ref 5–15)
BUN: 15 mg/dL (ref 6–20)
CO2: 22 mmol/L (ref 22–32)
Calcium: 7.4 mg/dL — ABNORMAL LOW (ref 8.9–10.3)
Chloride: 116 mmol/L — ABNORMAL HIGH (ref 98–111)
Creatinine, Ser: 0.53 mg/dL — ABNORMAL LOW (ref 0.61–1.24)
GFR, Estimated: >60 mL/min (ref >60)
Glucose, Bld: 66 mg/dL — ABNORMAL LOW (ref 70–99)
Potassium: 3.2 mmol/L — ABNORMAL LOW (ref 3.5–5.1)
Sodium: 142 mmol/L (ref 135–145)

## 2021-05-14 LAB — PHOSPHORUS: Phosphorus: 2.4 mg/dL — ABNORMAL LOW (ref 2.5–4.6)

## 2021-05-14 LAB — MAGNESIUM: Magnesium: 1.6 mg/dL — ABNORMAL LOW (ref 1.7–2.4)

## 2021-05-14 MED ORDER — LOPERAMIDE HCL 2 MG PO CAPS
2.0000 mg | ORAL_CAPSULE | ORAL | Status: DC | PRN
  Administered 2021-05-14 – 2021-05-18 (×15): 2 mg via ORAL
  Filled 2021-05-14 (×14): qty 1

## 2021-05-14 MED ORDER — MAGNESIUM SULFATE 2 GM/50ML IV SOLN
2.0000 g | Freq: Once | INTRAVENOUS | Status: AC
  Administered 2021-05-14: 2 g via INTRAVENOUS
  Filled 2021-05-14: qty 50

## 2021-05-14 MED ORDER — MAGNESIUM OXIDE -MG SUPPLEMENT 400 (240 MG) MG PO TABS
400.0000 mg | ORAL_TABLET | Freq: Every day | ORAL | Status: DC
  Administered 2021-05-14 – 2021-05-17 (×4): 400 mg via ORAL
  Filled 2021-05-14 (×4): qty 1

## 2021-05-14 MED ORDER — POTASSIUM CHLORIDE CRYS ER 20 MEQ PO TBCR
40.0000 meq | EXTENDED_RELEASE_TABLET | Freq: Every day | ORAL | Status: DC
  Administered 2021-05-14: 40 meq via ORAL
  Filled 2021-05-14 (×2): qty 2

## 2021-05-14 MED ORDER — KETOROLAC TROMETHAMINE 30 MG/ML IJ SOLN
30.0000 mg | Freq: Four times a day (QID) | INTRAMUSCULAR | Status: AC | PRN
  Administered 2021-05-14 – 2021-05-19 (×16): 30 mg via INTRAVENOUS
  Filled 2021-05-14 (×16): qty 1

## 2021-05-14 NOTE — Progress Notes (Signed)
Patient upset stated he has been waiting for his pain medication since 6PM; percocet offered- stated "I want the IV one". Notified it is due at aroun 8:15; stated "I'd wait for that one". Education provided.  ?

## 2021-05-14 NOTE — Progress Notes (Addendum)
?PROGRESS NOTE ? ? ? ?Antonie Nelson  [id number] DOB: 29-Jul-1965 DOA: 05/10/2021 ?PCP: [name], MD  ? ? ?Brief Narrative:  ?Logan Nelson is a 56 y.o. male with past medical history of severe protein calorie malnutrition, hypertension, anasarca, abdominal wall hernia presented to hospital with bilateral lower extremity weakness.  Patient was recently hospitalized for abdominal hernia with incarceration and had a open primary repair but then had left AMA before he was placed in rehab.  Patient was admitted this time with ambulatory dysfunction and bilateral lower extremity weakness. ? ?Assessment and plan ? ?Principal Problem: ?  Lower extremity weakness ?Active Problems: ?  Hypertension ?  Protein-calorie malnutrition, severe ?  Hypokalemia ?  Anemia of chronic disease ?  GERD (gastroesophageal reflux disease) ?  Generalized weakness ?  Hypophosphatemia ?  Hypomagnesemia ?  Vitamin D deficiency ?  ? ?BLE weakness/RUE weakness/ambulatory dysfunction, deconditioning ?Thought to be secondary to deconditioning and severe malnutrition.  Seen by neurology.  There is some enhancement on the MRI scan of the spine. Spoke with neurosurgery about this finding.  Looks like nonspecific finding . Vitamin B1 level at 127.  Vitamin B12 at 573.   TSH at 2.9.  Vitamin D level very low at 19.7.  We will continue with high-dose of vitamin D supplementation q. weekly for now..   ?  ?Severe protein-calorie malnutrition ?Present on admission.  Continue supplementation.  Albumin at 1.8.  Will encourage oral nutrition.  Continue Ensure supplements. ?  ?Anasarca ?Aichele secondary to severe malnutrition and low albumin.  Encouraged oral intake. ?  ?S/p open primary repair of chronically incarcerated ventral incisional hernia, primary right inguinal hernia repair without mesh, extensive lysis of adhesions on 04/30/2021. ? Continue dressing as per surgery ? ?Diarrhea. ?Negative C. difficile screen.  On Lomotil and  loperamide.  Had 2 bowel movements this morning.  Could not hold Flexi-Seal.  GI pathogen panel was negative.  We will treat this symptomatically. ? ?HTN ?Blood pressure seems to be stable.  Did not see any antihypertensives on board  ?  ?Anemia of chronic disease ?Latest hemoglobin of 9.5.  We will continue to monitor. ?    ?GERD ?On Protonix. ?  ?Hypokalemia ?We will continue to replenish.  Latest potassium 3.2 ? ?Hypophosphatemia.  Received IV K-Phos yesterday.  Phosphorus improved to 2.4 today.  We will continue to replenish orally.  ? ?Vitamin D deficiency .  On vitamin D supplements as outpatient.  Repeat vitamin D level at 19.79.  Continue 50,000 unit vitamin D q. weekly while in the hospital  ? ?Hypomagnesemia.  We will replace with IV magnesium sulfate and p.o magnesium as well due to concurrent hypokalemia..   ? ?Pressure ulceration left buttocks stage II.  Present on admission. ?Pressure Injury 05/04/21 Buttocks Left Stage 2 -  Partial thickness loss of dermis presenting as a shallow open injury with a red, pink wound bed without slough. small dime sized skin tear (Active)  ?05/04/21 0800  ?Location: Buttocks  ?Location Orientation: Left  ?Staging: Stage 2 -  Partial thickness loss of dermis presenting as a shallow open injury with a red, pink wound bed without slough.  ?Wound Description (Comments): small dime sized skin tear  ?Present on Admission: No  ?Continue wound care. ?  ? ? ? DVT prophylaxis: SCDs Start: 05/11/21 2009 ? ? ?Code Status:   ?  Code Status: Full Code ? ?Disposition: Likely to rehabilitation, TOC on board for disposition plan. ? ?Status is: Inpatient ? ?  The patient is inpatient because: Multiple electrolyte abnormalities, ambulatory dysfunction, need for rehabilitation ? ? Family Communication:  ?Spoke with the patient at bedside  ? ?Consultants:  ?Neurology,  ?General surgery ?Neurosurgery  ? ?Procedures:  ?None ? ?Antimicrobials:  ?None ? ?Anti-infectives (From admission, onward)   ? ? None  ? ?  ? ?Subjective: ?Today, patient was seen and examined at bedside.  Complains of lower back pain not improved with oral medication requesting IV.  Complains of 2 episodes of bowel movement this morning.  Has been eating better.  Was able to get to the commode  ? ?objective: ?Vitals:  ? 05/13/21 2024 05/13/21 2333 05/14/21 0420 05/14/21 0831  ?BP: 123/89 114/90 109/82 107/74  ?Pulse: 93 94 77 69  ?Resp: (!) 22 20 19 14   ?Temp: 98.1 ?F (36.7 ?C) 98.2 ?F (36.8 ?C) (!) 97.4 ?F (36.3 ?C)   ?TempSrc: Oral Oral Oral   ?SpO2: 100% 100% 100% 99%  ?Weight:      ?Height:      ? ? ?Intake/Output Summary (Last 24 hours) at 05/14/2021 1348 ?Last data filed at 05/14/2021 0600 ?Gross per 24 hour  ?Intake 600 ml  ?Output 500 ml  ?Net 100 ml  ? ?Filed Weights  ? 05/13/21 1600  ?Weight: 65 kg  ? ? ?Physical Examination: ?Body mass index is 23.13 kg/m?.  ? ?General: Thinly built, not in obvious distress ?HENT:   No scleral pallor or icterus noted. Oral mucosa is moist.  ?Chest:  Clear breath sounds.  Diminished breath sounds bilaterally. No crackles or wheezes.  ?CVS: S1 &S2 heard. No murmur.  Regular rate and rhythm. ?Abdomen: Soft, nontender, nondistended.  Bowel sounds are heard.   ?Extremities: No cyanosis, clubbing, peripheral edema noted, peripheral pulses are palpable. ?Psych: Alert, awake and oriented, normal mood ?CNS:  No cranial nerve deficits.  Bilateral lower extremity weakness noted. ?Skin: Warm and dry.  No rashes noted.  Right groin incision clean dry and intact.  Left buttocks stage II ulceration present on admission. ? ? ? ?Data Reviewed:  ? ?CBC: ?Recent Labs  ?Lab 05/10/21 ?1656 05/10/21 ?1703 05/11/21 ?1415 05/12/21 ?[id number] 05/13/21 ?0051 05/14/21 ?0345  ?WBC 9.2  --  8.6 13.2* 6.7 6.4  ?NEUTROABS 7.4  --  6.9  --   --   --   ?HGB 11.5* 12.2* 10.7* 9.3* 9.5* 8.8*  ?HCT 35.8* 36.0* 32.4* 29.1* 29.5* 27.6*  ?MCV 94.0  --  94.7 93.9 94.9 94.8  ?PLT 391  --  385 318 324 308  ? ? ?Basic Metabolic Panel: ?Recent  Labs  ?Lab 05/10/21 ?1656 05/10/21 ?1703 05/11/21 ?1415 05/12/21 ?[id number] 05/13/21 ?0051 05/14/21 ?0345  ?NA 140 145 145 143 140 142  ?K 2.9* 3.0* 3.4* 3.4* 3.5 3.2*  ?CL 110 106 114* 114* 112* 116*  ?CO2 25  --  28 24 24 22   ?GLUCOSE 104* 100* 92 93 92 66*  ?BUN 16 19 16 15 16 15   ?CREATININE 0.58* 0.50* 0.53* 0.56* 0.57* 0.53*  ?CALCIUM 7.3*  --  7.4* 7.3* 7.3* 7.4*  ?MG 1.9  --  1.8  --  1.4* 1.6*  ?PHOS  --   --  2.0*  --  <1.0* 2.4*  ? ? ?Liver Function Tests: ?Recent Labs  ?Lab 05/10/21 ?1656 05/11/21 ?1415 05/12/21 ?[id number] 05/13/21 ?0051  ?AST 58* 47* 40 32  ?ALT 91* 70* 58* 52*  ?ALKPHOS 166* 153* 140* 124  ?BILITOT 0.9 0.7 0.4 0.5  ?PROT 5.4* 5.0* 4.1* 4.4*  ?ALBUMIN 2.0*  1.8* <1.5* 1.5*  ? ? ? ?Radiology Studies: ?No results found. ? ? ? LOS: 2 days  ? ? ?Flora Lipps, MD ?[facility] ?05/14/2021, 1:48 PM  ? ? ?

## 2021-05-14 NOTE — Progress Notes (Signed)
Physical Therapy Treatment ?Patient Details ?Name: Logan Nelson ?MRN: [id number] ?DOB: 12/22/1965 ?Today's Date: 05/14/2021 ? ? ?History of Present Illness pt is a 20 admitted 4/3 with quickly progressing B LE weakness.  Recently hospitalize with incarcerated abdominal hernia, was repaired, but pt left AMA before he was safe to mobilize,  MRI of brain, cervical/thoracic spine inconclusive.  Work up continues.  PMHx: anemia, arthritis, dyspnea, GERD, HTN, LE edema ? ?  ?PT Comments  ? ? Pt initially not eager to mobilize, states "I have been up and down all day for using the bathroom". After mod encouragement, pt agreeable to OOB mobility. Pt ambulatory in hallway with use of RW, overall requiring min-mod physical assist for mobility at this time. PT recommending post-acute ST-SNF to return to PLOF, will continue to follow.  ?   ?Recommendations for follow up therapy are one component of a multi-disciplinary discharge planning process, led by the attending physician.  Recommendations may be updated based on patient status, additional functional criteria and insurance authorization. ? ?Follow Up Recommendations ? Skilled nursing-short term rehab (<3 hours/day) ?  ?  ?Assistance Recommended at Discharge Intermittent Supervision/Assistance  ?Patient can return home with the following A lot of help with walking and/or transfers;A lot of help with bathing/dressing/bathroom;Assistance with cooking/housework;Direct supervision/assist for medications management;Direct supervision/assist for financial management;Assist for transportation;Help with stairs or ramp for entrance ?  ?Equipment Recommendations ? Rolling walker (2 wheels)  ?  ?Recommendations for Other Services Rehab consult ? ? ?  ?Precautions / Restrictions Precautions ?Precautions: Fall ?Precaution Comments: R side weak, very incontinent ?Restrictions ?Weight Bearing Restrictions: No  ?  ? ?Mobility ? Bed Mobility ?Overal bed mobility: Needs Assistance ?Bed  Mobility: Supine to Sit, Sit to Supine ?  ?  ?Supine to sit: Min assist ?Sit to supine: Mod assist ?  ?General bed mobility comments: min assist for trunk elevation moving supine>sit, mod for return to supine for LE lifting into bed. ?  ? ?Transfers ?Overall transfer level: Needs assistance ?Equipment used: Rolling walker (2 wheels) ?Transfers: Sit to/from Stand ?Sit to Stand: Min assist, From elevated surface, +2 safety/equipment ?  ?  ?  ?  ?  ?General transfer comment: assist to rise, steady, and hold RW steady. ?  ? ?Ambulation/Gait ?Ambulation/Gait assistance: Min assist ?Gait Distance (Feet): 75 Feet ?Assistive device: Rolling walker (2 wheels) ?Gait Pattern/deviations: Step-through pattern, Decreased stride length, Trunk flexed ?Gait velocity: decr ?  ?  ?General Gait Details: cues for upright posture, placement in RW ? ? ?Stairs ?  ?  ?  ?  ?  ? ? ?Wheelchair Mobility ?  ? ?Modified Rankin (Stroke Patients Only) ?  ? ? ?  ?Balance Overall balance assessment: Needs assistance ?Sitting-balance support: Feet supported ?Sitting balance-Leahy Scale: Fair ?  ?  ?Standing balance support: Bilateral upper extremity supported ?Standing balance-Leahy Scale: Poor ?  ?  ?  ?  ?  ?  ?  ?  ?  ?  ?  ?  ?  ? ?  ?Cognition Arousal/Alertness: Awake/alert ?Behavior During Therapy: [facility] for tasks assessed/performed ?Overall Cognitive Status: Impaired/Different from baseline ?Area of Impairment: Problem solving, Safety/judgement, Awareness ?  ?  ?  ?  ?  ?  ?  ?  ?  ?  ?  ?  ?Safety/Judgement: Decreased awareness of safety, Decreased awareness of deficits ?Awareness: Emergent ?Problem Solving: Slow processing, Decreased initiation ?General Comments: Pt. originally declines PT session saying "I've been up all day" , then accepts sitting  up EOB and wants to walk. ?  ?  ? ?  ?Exercises   ? ?  ?General Comments   ?  ?  ? ?Pertinent Vitals/Pain Pain Assessment ?Pain Assessment: Faces ?Faces Pain Scale: Hurts a little bit ?Pain  Location: generalized ?Pain Descriptors / Indicators: Grimacing ?Pain Intervention(s): Limited activity within patient's tolerance, Monitored during session, Repositioned  ? ? ?Home Living   ?  ?  ?  ?  ?  ?  ?  ?  ?  ?   ?  ?Prior Function    ?  ?  ?   ? ?PT Goals (current goals can now be found in the care plan section) Acute Rehab PT Goals ?Patient Stated Goal: return to independence ?PT Goal Formulation: With patient ?Time For Goal Achievement: 05/26/21 ?Potential to Achieve Goals: Good ?Progress towards PT goals: Progressing toward goals ? ?  ?Frequency ? ? ? Min 3X/week ? ? ? ?  ?PT Plan Current plan remains appropriate  ? ? ?Co-evaluation   ?  ?  ?  ?  ? ?  ?AM-PAC PT "6 Clicks" Mobility   ?Outcome Measure ? Help needed turning from your back to your side while in a flat bed without using bedrails?: A Little ?Help needed moving from lying on your back to sitting on the side of a flat bed without using bedrails?: A Lot ?Help needed moving to and from a bed to a chair (including a wheelchair)?: A Lot ?Help needed standing up from a chair using your arms (e.g., wheelchair or bedside chair)?: A Little ?Help needed to walk in hospital room?: A Little ?Help needed climbing 3-5 steps with a railing? : A Lot ?6 Click Score: 15 ? ?  ?End of Session Equipment Utilized During Treatment: Gait belt ?Activity Tolerance: Patient tolerated treatment well ?Patient left: in bed;with call bell/phone within reach;with bed alarm set ?Nurse Communication: Mobility status ?PT Visit Diagnosis: Other abnormalities of gait and mobility (R26.89);Muscle weakness (generalized) (M62.81);History of falling (Z91.81);Pain ?  ? ? ?Time: [id number] ?PT Time Calculation (min) (ACUTE ONLY): 18 min ? ?Charges:  $Gait Training: 8-22 mins          ?          ? ?Stacie Glaze, PT DPT ?Acute Rehabilitation Services ?Pager [phone]  ?Office [phone] ? ? ? ?Ryanna Teschner E Stroup ?05/14/2021, 3:50 PM ? ?

## 2021-05-15 MED ORDER — MAGNESIUM SULFATE 2 GM/50ML IV SOLN
2.0000 g | Freq: Once | INTRAVENOUS | Status: AC
  Administered 2021-05-15: 2 g via INTRAVENOUS
  Filled 2021-05-15: qty 50

## 2021-05-15 MED ORDER — POTASSIUM CHLORIDE CRYS ER 20 MEQ PO TBCR
40.0000 meq | EXTENDED_RELEASE_TABLET | Freq: Two times a day (BID) | ORAL | Status: DC
Start: 2021-05-15 — End: 2021-05-16
  Administered 2021-05-15 (×2): 40 meq via ORAL
  Filled 2021-05-15: qty 2

## 2021-05-15 NOTE — Progress Notes (Signed)
?PROGRESS NOTE ? ? ? ?Logan Nelson  [id number] DOB: [birth year] DOA: 05/10/2021 ?PCP: [name], MD  ? ? ?Brief Narrative:  ?Kane Fetterhoff is a 56 y.o. male with past medical history of severe protein calorie malnutrition, hypertension, anasarca, abdominal wall hernia presented to hospital with bilateral lower extremity weakness.  Patient was recently hospitalized for abdominal hernia with incarceration and had a open primary repair but then had left AMA before he was placed in rehab.  Patient was admitted this time with ambulatory dysfunction and bilateral lower extremity weakness. ? ?Assessment and plan ? ?Principal Problem: ?  Lower extremity weakness ?Active Problems: ?  Hypertension ?  Protein-calorie malnutrition, severe ?  Hypokalemia ?  Anemia of chronic disease ?  GERD (gastroesophageal reflux disease) ?  Generalized weakness ?  Hypophosphatemia ?  Hypomagnesemia ?  Vitamin D deficiency ?  Pressure injury of buttock, stage 2 ([place]) ?  ? ?BLE weakness/RUE weakness/ambulatory dysfunction, deconditioning ?Thought to be secondary to deconditioning and severe malnutrition.  Seen by neurology.  There is some enhancement on the MRI scan of the spine. Spoke with neurosurgery about this finding.  Looks like nonspecific finding . Vitamin B1 level at 127.  Vitamin B12 at 573.   TSH at 2.9.  Vitamin D level very low at 19.7.  We will continue with high-dose of vitamin D supplementation q. weekly for now..   ?  ?Severe protein-calorie malnutrition ?Present on admission.  Continue supplementation.  Albumin at 1.8.  Will encourage oral nutrition.  Continue Ensure supplements. ?  ?Anasarca ? secondary to severe malnutrition and low albumin.  Encouraged oral intake. ?  ?S/p open primary repair of chronically incarcerated ventral incisional hernia, primary right inguinal hernia repair without mesh, extensive lysis of adhesions on 04/30/2021. ? Continue dressing  ? ?Diarrhea. ?Improved.  Negative C. difficile  screen.  On as needed Lomotil and loperamide.    GI pathogen panel was negative.   ? ?HTN ?Blood pressure seems to be stable.  Not on antihypertensives ? ?Anemia of chronic disease ?Latest hemoglobin of 9.5.  We will continue to monitor. ?    ?GERD ?On Protonix. ?  ?Hypokalemia ?We will continue to replenish.  Latest potassium 3.2.  Check BMP in a.m. ? ?Hypophosphatemia.  Still low but improving.  Continue with K-Phos ? ?Vitamin D deficiency .  On vitamin D supplements as outpatient.  Repeat vitamin D level at 19.79.  Continue 50,000 unit vitamin D q. weekly while in the hospital  ? ?Hypomagnesemia.  We will replace with IV magnesium sulfate and p.o magnesium as well due to concurrent hypokalemia..   ? ?Pressure ulceration left buttocks stage II.  Present on admission. ?Pressure Injury 05/04/21 Buttocks Left Stage 2 -  Partial thickness loss of dermis presenting as a shallow open injury with a red, pink wound bed without slough. small dime sized skin tear (Active)  ?05/04/21 0800  ?Location: Buttocks  ?Location Orientation: Left  ?Staging: Stage 2 -  Partial thickness loss of dermis presenting as a shallow open injury with a red, pink wound bed without slough.  ?Wound Description (Comments): small dime sized skin tear  ?Present on Admission: No  ?Continue wound care. ?  ? ? DVT prophylaxis: SCDs Start: 05/11/21 2009 ? ? ?Code Status:   ?  Code Status: Full Code ? ?Disposition: Likely to rehabilitation, TOC on board for disposition plan. ? ?Status is: Inpatient ? ?The patient is inpatient because: Multiple electrolyte abnormalities, ambulatory dysfunction, need for rehabilitation ? ?  Family Communication:  ?Spoke with the patient at bedside  ? ?Consultants:  ?Neurology,  ?General surgery ?Neurosurgery  ? ?Procedures:  ?None ? ?Antimicrobials:  ?None ? ?Anti-infectives (From admission, onward)  ? ? None  ? ?  ? ?Subjective: ?Today, patient was seen and examined at bedside.  States that his pain in the back is better  today.  Denies any nausea or vomiting.  Feels hungry.  Did not have a bowel movement this morning ? ?objective: ?Vitals:  ? 05/14/21 2023 05/14/21 2320 05/15/21 0345 05/15/21 0754  ?BP: 119/84 102/67 115/71 115/81  ?Pulse: 65 78 65 65  ?Resp: 17 (!) 21 19 16   ?Temp: 98 ?F (36.7 ?C) 98.4 ?F (36.9 ?C) 97.9 ?F (36.6 ?C) 98 ?F (36.7 ?C)  ?TempSrc: Oral Oral Oral Oral  ?SpO2: 100% 98% 100% 99%  ?Weight:      ?Height:      ? ? ?Intake/Output Summary (Last 24 hours) at 05/15/2021 1149 ?Last data filed at 05/15/2021 0553 ?Gross per 24 hour  ?Intake --  ?Output 1100 ml  ?Net -1100 ml  ? ?Filed Weights  ? 05/13/21 1600  ?Weight: 65 kg  ? ? ?Physical Examination: ?Body mass index is 23.13 kg/m?.  ? ?General: Thinly built, not in obvious distress ?HENT:   No scleral pallor or icterus noted. Oral mucosa is moist.  ?Chest:  Clear breath sounds.  Diminished breath sounds bilaterally. No crackles or wheezes.  ?CVS: S1 &S2 heard. No murmur.  Regular rate and rhythm. ?Abdomen: Soft, nontender, nondistended.  Bowel sounds are heard.   ?Extremities: No cyanosis, clubbing or edema.  Peripheral pulses are palpable. ?Psych: Alert, awake and oriented, normal mood ?CNS:  No cranial nerve deficits.  Bilateral lower extremity weakness noted. ?Skin: Warm and dry.  No rashes noted.  Left buttocks stage II ulceration present on admission. ? ? ?Data Reviewed:  ? ?CBC: ?Recent Labs  ?Lab 05/10/21 ?1656 05/10/21 ?1703 05/11/21 ?1415 05/12/21 ?[id number] 05/13/21 ?0051 05/14/21 ?0345  ?WBC 9.2  --  8.6 13.2* 6.7 6.4  ?NEUTROABS 7.4  --  6.9  --   --   --   ?HGB 11.5* 12.2* 10.7* 9.3* 9.5* 8.8*  ?HCT 35.8* 36.0* 32.4* 29.1* 29.5* 27.6*  ?MCV 94.0  --  94.7 93.9 94.9 94.8  ?PLT 391  --  385 318 324 308  ? ? ?Basic Metabolic Panel: ?Recent Labs  ?Lab 05/10/21 ?1656 05/10/21 ?1703 05/11/21 ?1415 05/12/21 ?[id number] 05/13/21 ?0051 05/14/21 ?0345  ?NA 140 145 145 143 140 142  ?K 2.9* 3.0* 3.4* 3.4* 3.5 3.2*  ?CL 110 106 114* 114* 112* 116*  ?CO2 25  --  28 24 24 22    ?GLUCOSE 104* 100* 92 93 92 66*  ?BUN 16 19 16 15 16 15   ?CREATININE 0.58* 0.50* 0.53* 0.56* 0.57* 0.53*  ?CALCIUM 7.3*  --  7.4* 7.3* 7.3* 7.4*  ?MG 1.9  --  1.8  --  1.4* 1.6*  ?PHOS  --   --  2.0*  --  <1.0* 2.4*  ? ? ?Liver Function Tests: ?Recent Labs  ?Lab 05/10/21 ?1656 05/11/21 ?1415 05/12/21 ?[id number] 05/13/21 ?0051  ?AST 58* 47* 40 32  ?ALT 91* 70* 58* 52*  ?ALKPHOS 166* 153* 140* 124  ?BILITOT 0.9 0.7 0.4 0.5  ?PROT 5.4* 5.0* 4.1* 4.4*  ?ALBUMIN 2.0* 1.8* <1.5* 1.5*  ? ? ? ?Radiology Studies: ?No results found. ? ? ? LOS: 3 days  ? ? ?Flora Lipps, MD ?[facility] ?05/15/2021, 11:49 AM  ? ? ?

## 2021-05-15 NOTE — Progress Notes (Signed)
Patient had 2 watery BMs this shift; lomotil and imodium given as ordered.  ?

## 2021-05-16 DIAGNOSIS — L89302 Pressure ulcer of unspecified buttock, stage 2: Secondary | ICD-10-CM

## 2021-05-16 LAB — BASIC METABOLIC PANEL
Anion gap: 4 — ABNORMAL LOW (ref 5–15)
BUN: 23 mg/dL — ABNORMAL HIGH (ref 6–20)
CO2: 24 mmol/L (ref 22–32)
Calcium: 7.9 mg/dL — ABNORMAL LOW (ref 8.9–10.3)
Chloride: 112 mmol/L — ABNORMAL HIGH (ref 98–111)
Creatinine, Ser: 0.76 mg/dL (ref 0.61–1.24)
GFR, Estimated: >60 mL/min (ref >60)
Glucose, Bld: 72 mg/dL (ref 70–99)
Potassium: 4.1 mmol/L (ref 3.5–5.1)
Sodium: 140 mmol/L (ref 135–145)

## 2021-05-16 LAB — MAGNESIUM: Magnesium: 1.8 mg/dL (ref 1.7–2.4)

## 2021-05-16 LAB — CBC
HCT: 32.2 % — ABNORMAL LOW (ref 39.0–52.0)
Hemoglobin: 10.5 g/dL — ABNORMAL LOW (ref 13.0–17.0)
MCH: 30.6 pg (ref 26.0–34.0)
MCHC: 32.6 g/dL (ref 30.0–36.0)
MCV: 93.9 fL (ref 80.0–100.0)
Platelets: 356 10*3/uL (ref 150–400)
RBC: 3.43 MIL/uL — ABNORMAL LOW (ref 4.22–5.81)
RDW: 15.7 % — ABNORMAL HIGH (ref 11.5–15.5)
WBC: 7.5 10*3/uL (ref 4.0–10.5)
nRBC: 0 % (ref 0.0–0.2)

## 2021-05-16 MED ORDER — ONDANSETRON HCL 4 MG/2ML IJ SOLN
4.0000 mg | Freq: Four times a day (QID) | INTRAMUSCULAR | Status: DC | PRN
Start: 2021-05-16 — End: 2021-06-16
  Administered 2021-05-16 – 2021-06-09 (×5): 4 mg via INTRAVENOUS
  Filled 2021-05-16 (×7): qty 2

## 2021-05-16 MED ORDER — POTASSIUM CHLORIDE CRYS ER 20 MEQ PO TBCR
20.0000 meq | EXTENDED_RELEASE_TABLET | Freq: Every day | ORAL | Status: DC
  Administered 2021-05-17: 20 meq via ORAL
  Filled 2021-05-16 (×2): qty 1

## 2021-05-16 MED ORDER — K PHOS MONO-SOD PHOS DI & MONO 155-852-130 MG PO TABS
250.0000 mg | ORAL_TABLET | Freq: Three times a day (TID) | ORAL | Status: DC
  Administered 2021-05-16 – 2021-05-17 (×4): 250 mg via ORAL
  Filled 2021-05-16 (×4): qty 1

## 2021-05-16 MED ORDER — SIMETHICONE 80 MG PO CHEW
80.0000 mg | CHEWABLE_TABLET | Freq: Four times a day (QID) | ORAL | Status: DC | PRN
  Administered 2021-05-16: 80 mg via ORAL
  Filled 2021-05-16 (×2): qty 1

## 2021-05-16 NOTE — Progress Notes (Signed)
[facility] informed that patient is vomiting and the Zofran is PO asked if it could be changed to IV ?

## 2021-05-16 NOTE — Plan of Care (Signed)
  Problem: Health Behavior/Discharge Planning: Goal: Ability to manage health-related needs will improve Outcome: Progressing   Problem: Clinical Measurements: Goal: Ability to maintain clinical measurements within normal limits will improve Outcome: Progressing Goal: Will remain free from infection Outcome: Progressing Goal: Diagnostic test results will improve Outcome: Progressing Goal: Respiratory complications will improve Outcome: Progressing Goal: Cardiovascular complication will be avoided Outcome: Progressing   Problem: Activity: Goal: Risk for activity intolerance will decrease Outcome: Progressing   Problem: Nutrition: Goal: Adequate nutrition will be maintained Outcome: Progressing   Problem: Coping: Goal: Level of anxiety will decrease Outcome: Progressing   Problem: Elimination: Goal: Will not experience complications related to urinary retention Outcome: Progressing   Problem: Pain Managment: Goal: General experience of comfort will improve Outcome: Progressing   Problem: Skin Integrity: Goal: Risk for impaired skin integrity will decrease Outcome: Progressing   

## 2021-05-16 NOTE — Progress Notes (Signed)
[facility] notifed that patient out put is about 133mls of dark amber urine. Lungs clear but abdomen is more distended  ?

## 2021-05-16 NOTE — Progress Notes (Signed)
?PROGRESS NOTE ? ? ? ?Logan Nelson  [id number] DOB: April 06, 1965 DOA: 05/10/2021 ?PCP: [name], MD  ? ? ?Brief Narrative:  ?Logan Nelson is a 56 y.o. male with past medical history of severe protein calorie malnutrition, hypertension, anasarca, abdominal wall hernia presented to hospital with bilateral lower extremity weakness.  Patient was recently hospitalized for abdominal hernia with incarceration and had a open primary repair but then had left AMA before he was placed in rehab.  Patient was admitted this time with ambulatory dysfunction and bilateral lower extremity weakness. ? ?Assessment and plan ? ?Principal Problem: ?  Lower extremity weakness ?Active Problems: ?  Hypertension ?  Protein-calorie malnutrition, severe ?  Hypokalemia ?  Anemia of chronic disease ?  GERD (gastroesophageal reflux disease) ?  Generalized weakness ?  Hypophosphatemia ?  Hypomagnesemia ?  Vitamin D deficiency ?  Pressure injury of buttock, stage 2 ([place]) ?  ? ?BLE weakness/RUE weakness/ambulatory dysfunction, deconditioning ?Thought to be secondary to deconditioning and severe malnutrition.  Seen by neurology.  There is some enhancement on the MRI scan of the spine. Spoke with neurosurgery about this finding.  Looks like nonspecific finding . Vitamin B1 level at 127.  Vitamin B12 at 573.   TSH at 2.9.  Vitamin D level very low at 19.7.  We will continue with high-dose of vitamin D supplementation q. weekly for now..   ?  ?Severe protein-calorie malnutrition ?Present on admission.  Continue supplementation.  Albumin at 1.8.  Will encourage oral nutrition.  Continue Ensure supplements. ?  ?Anasarca ? secondary to severe malnutrition and low albumin.  Encouraged oral intake. ?  ?S/p open primary repair of chronically incarcerated ventral incisional hernia, primary right inguinal hernia repair without mesh, extensive lysis of adhesions on 04/30/2021. ? Continue dressing  ? ?Diarrhea. ?Improved.  Negative C. difficile  screen.  On as needed Lomotil and loperamide.    GI pathogen panel was negative.   ? ?HTN ?Blood pressure seems to be stable.  Not on antihypertensives ? ?Anemia of chronic disease ?Latest hemoglobin of 10.5.  We will continue to monitor. ?    ?GERD ?On Protonix. ?  ?Hypokalemia ?We will continue to replenish.  Latest potassium of 4.1.  Check BMP in a.m. ? ?Hypophosphatemia.  Continue with K-Phos with lower dose. ? ?Vitamin D deficiency .  On vitamin D supplements as outpatient.  Repeat vitamin D level at 19.79.  Continue 50,000 unit vitamin D q. weekly while in the hospital  ? ?Hypomagnesemia.  We will replace with IV magnesium sulfate and p.o magnesium as well due to concurrent hypokalemia..  Latest magnesium of 1.8  ? ?Pressure ulceration left buttocks stage II.  Present on admission. ?Pressure Injury 05/04/21 Buttocks Left Stage 2 -  Partial thickness loss of dermis presenting as a shallow open injury with a red, pink wound bed without slough. small dime sized skin tear (Active)  ?05/04/21 0800  ?Location: Buttocks  ?Location Orientation: Left  ?Staging: Stage 2 -  Partial thickness loss of dermis presenting as a shallow open injury with a red, pink wound bed without slough.  ?Wound Description (Comments): small dime sized skin tear  ?Present on Admission: No  ?Continue wound care. ?  ? ? DVT prophylaxis: SCDs Start: 05/11/21 2009 ? ? ?Code Status:   ?  Code Status: Full Code ? ?Disposition: ? Likely to rehabilitation, TOC on board for disposition plan. ? ?Status is: Inpatient ? ?The patient is inpatient because:  ambulatory dysfunction, need for rehabilitation ? ?  Family Communication:  ?Spoke with the patient at bedside  ? ?Consultants:  ?Neurology,  ?General surgery ?Neurosurgery  ? ?Procedures:  ?None ? ?Antimicrobials:  ?None ? ?Anti-infectives (From admission, onward)  ? ? None  ? ?  ? ?Subjective: ?Today, patient was seen and examined at bedside.  Patient states that his pain is better today.  Denies any  nausea vomiting shortness of breath or chest pain.  ? ?objective: ?Vitals:  ? 05/15/21 2100 05/15/21 2326 05/16/21 0300 05/16/21 1143  ?BP: 108/74 110/70 114/75 101/78  ?Pulse: 66 85 89 96  ?Resp: 20 18 18 16   ?Temp: (!) 97.4 ?F (36.3 ?C) 97.8 ?F (36.6 ?C) 97.9 ?F (36.6 ?C)   ?TempSrc: Oral Oral Oral   ?SpO2: 98% 99% 100% 100%  ?Weight:      ?Height:      ? ? ?Intake/Output Summary (Last 24 hours) at 05/16/2021 1148 ?Last data filed at 05/16/2021 0600 ?Gross per 24 hour  ?Intake --  ?Output 450 ml  ?Net -450 ml  ? ?Filed Weights  ? 05/13/21 1600  ?Weight: 65 kg  ? ? ?Physical Examination: ?Body mass index is 23.13 kg/m?.  ? ?General: Thinly built, not in obvious distress ?HENT:   No scleral pallor or icterus noted. Oral mucosa is moist.  ?Chest:  Clear breath sounds.  Diminished breath sounds bilaterally. No crackles or wheezes.  ?CVS: S1 &S2 heard. No murmur.  Regular rate and rhythm. ?Abdomen: Soft, nontender, nondistended.  Bowel sounds are heard.   ?Extremities: No cyanosis, clubbing or edema.  Peripheral pulses are palpable. ?Psych: Alert, awake and oriented, normal mood ?CNS:  No cranial nerve deficits.  Bilateral lower extremity weakness noted. ?Skin: Warm and dry.  Left buttock stage II ulceration present on admission. ? ? ?Data Reviewed:  ? ?CBC: ?Recent Labs  ?Lab 05/10/21 ?1656 05/10/21 ?1703 05/11/21 ?1415 05/12/21 ?[id number] 05/13/21 ?0051 05/14/21 ?0345 05/16/21 ?[id number]  ?WBC 9.2  --  8.6 13.2* 6.7 6.4 7.5  ?NEUTROABS 7.4  --  6.9  --   --   --   --   ?HGB 11.5*   < > 10.7* 9.3* 9.5* 8.8* 10.5*  ?HCT 35.8*   < > 32.4* 29.1* 29.5* 27.6* 32.2*  ?MCV 94.0  --  94.7 93.9 94.9 94.8 93.9  ?PLT 391  --  385 318 324 308 356  ? < > = values in this interval not displayed.  ? ? ?Basic Metabolic Panel: ?Recent Labs  ?Lab 05/10/21 ?1656 05/10/21 ?1703 05/11/21 ?1415 05/12/21 ?[id number] 05/13/21 ?0051 05/14/21 ?0345 05/16/21 ?[id number]  ?NA 140   < > 145 143 140 142 140  ?K 2.9*   < > 3.4* 3.4* 3.5 3.2* 4.1  ?CL 110   < > 114* 114*  112* 116* 112*  ?CO2 25  --  28 24 24 22 24   ?GLUCOSE 104*   < > 92 93 92 66* 72  ?BUN 16   < > 16 15 16 15  23*  ?CREATININE 0.58*   < > 0.53* 0.56* 0.57* 0.53* 0.76  ?CALCIUM 7.3*  --  7.4* 7.3* 7.3* 7.4* 7.9*  ?MG 1.9  --  1.8  --  1.4* 1.6* 1.8  ?PHOS  --   --  2.0*  --  <1.0* 2.4*  --   ? < > = values in this interval not displayed.  ? ? ?Liver Function Tests: ?Recent Labs  ?Lab 05/10/21 ?1656 05/11/21 ?1415 05/12/21 ?[id number] 05/13/21 ?0051  ?AST 58* 47* 40 32  ?ALT 91*  70* 58* 52*  ?ALKPHOS 166* 153* 140* 124  ?BILITOT 0.9 0.7 0.4 0.5  ?PROT 5.4* 5.0* 4.1* 4.4*  ?ALBUMIN 2.0* 1.8* <1.5* 1.5*  ? ? ? ?Radiology Studies: ?No results found. ? ? ? LOS: 4 days  ? ? ?Flora Lipps, MD ?[facility] ?05/16/2021, 11:48 AM  ? ? ?

## 2021-05-17 LAB — CBC
HCT: 33 % — ABNORMAL LOW (ref 39.0–52.0)
Hemoglobin: 10.4 g/dL — ABNORMAL LOW (ref 13.0–17.0)
MCH: 29.9 pg (ref 26.0–34.0)
MCHC: 31.5 g/dL (ref 30.0–36.0)
MCV: 94.8 fL (ref 80.0–100.0)
Platelets: 352 10*3/uL (ref 150–400)
RBC: 3.48 MIL/uL — ABNORMAL LOW (ref 4.22–5.81)
RDW: 15.9 % — ABNORMAL HIGH (ref 11.5–15.5)
WBC: 9 10*3/uL (ref 4.0–10.5)
nRBC: 0 % (ref 0.0–0.2)

## 2021-05-17 LAB — BASIC METABOLIC PANEL
Anion gap: 7 (ref 5–15)
BUN: 38 mg/dL — ABNORMAL HIGH (ref 6–20)
CO2: 26 mmol/L (ref 22–32)
Calcium: 8.1 mg/dL — ABNORMAL LOW (ref 8.9–10.3)
Chloride: 108 mmol/L (ref 98–111)
Creatinine, Ser: 0.74 mg/dL (ref 0.61–1.24)
GFR, Estimated: >60 mL/min (ref >60)
Glucose, Bld: 53 mg/dL — ABNORMAL LOW (ref 70–99)
Potassium: 3.6 mmol/L (ref 3.5–5.1)
Sodium: 141 mmol/L (ref 135–145)

## 2021-05-17 LAB — PHOSPHORUS: Phosphorus: 4 mg/dL (ref 2.5–4.6)

## 2021-05-17 LAB — MAGNESIUM: Magnesium: 2.1 mg/dL (ref 1.7–2.4)

## 2021-05-17 NOTE — Progress Notes (Signed)
?PROGRESS NOTE ? ? ? ?Logan Nelson  [id number] DOB: 03-27-1965 DOA: 05/10/2021 ?PCP: [name], MD  ? ? ?Brief Narrative:  ?Logan Nelson is a 56 y.o. male with past medical history of severe protein calorie malnutrition, hypertension, anasarca, abdominal wall hernia presented to the hospital with bilateral lower extremity weakness.  Patient was recently hospitalized for abdominal hernia with incarceration and had a open primary repair but then had left AMA before he was placed in rehab.  Patient was admitted this time with ambulatory dysfunction and bilateral lower extremity weakness. ? ?Assessment and plan ? ?Principal Problem: ?  Lower extremity weakness ?Active Problems: ?  Hypertension ?  Protein-calorie malnutrition, severe ?  Hypokalemia ?  Anemia of chronic disease ?  GERD (gastroesophageal reflux disease) ?  Generalized weakness ?  Hypophosphatemia ?  Hypomagnesemia ?  Vitamin D deficiency ?  Pressure injury of buttock, stage 2 ([place]) ?  ? ?BLE weakness/RUE weakness/ambulatory dysfunction, deconditioning ?Thought to be secondary to deconditioning and severe malnutrition.  Seen by neurology.  There was some enhancement on the MRI scan of the spine. Spoke with neurosurgery about this finding.  Looks like nonspecific finding . Vitamin B1 level at 127.  Vitamin B12 at 573.   TSH at 2.9.  Vitamin D level very low at 19.7.  We will continue with high-dose of vitamin D supplementation q. weekly for now..  Patient has been reassessed by physical therapy again today and recommend rehabilitation placement. ?  ?Severe protein-calorie malnutrition ?Present on admission.  Continue supplementation.  Albumin at 1.8.  Will encourage oral nutrition.  Continue Ensure supplements. ?  ?Anasarca ? secondary to severe malnutrition and low albumin.  Encouraged oral intake. ?  ?S/p open primary repair of chronically incarcerated ventral incisional hernia, primary right inguinal hernia repair without mesh,  extensive lysis of adhesions on 04/30/2021. ? Continue dressing  ? ?Diarrhea. ?Improved.  Negative C. difficile screen.  On as needed Lomotil and loperamide.    GI pathogen panel was negative.   ? ?HTN ?Borderline low.  Not on any medication.  Continue to monitor. ? ?Anemia of chronic disease ?Latest hemoglobin of 10.5.  We will continue to monitor. ?    ?GERD ?On Protonix. ?  ?Hypokalemia ?Received aggressive repletion during hospitalization.  Potassium of 3.6 again today.  Continue to replenish orally ? ?Hypophosphatemia.  Replenished during hospitalization.  Phosphorus of 4.0 today. ? ?Vitamin D deficiency .   vitamin D level at 19.79.  Continue 50,000 unit vitamin D q. weekly while in the hospital  ? ?Hypomagnesemia.  Has been getting replacement through IV and oral.  Has improved at this time.  ? ?Pressure ulceration left buttocks stage II.  Present on admission. ?Pressure Injury 05/04/21 Buttocks Left Stage 2 -  Partial thickness loss of dermis presenting as a shallow open injury with a red, pink wound bed without slough. small dime sized skin tear (Active)  ?05/04/21 0800  ?Location: Buttocks  ?Location Orientation: Left  ?Staging: Stage 2 -  Partial thickness loss of dermis presenting as a shallow open injury with a red, pink wound bed without slough.  ?Wound Description (Comments): small dime sized skin tear  ?Present on Admission: No  ?Continue wound care. ?  ? DVT prophylaxis: SCDs Start: 05/11/21 2009 ? ? ?Code Status:   ?  Code Status: Full Code ? ?Disposition: ?Skilled nursing facility placement as per PT.  Difficult disposition due to lack of insurance at this time.   ? ?Status is: Inpatient ? ?  The patient is inpatient because:  ambulatory dysfunction, need for rehabilitation ? ? Family Communication:  ?Spoke with the patient at bedside  ? ?Consultants:  ?Neurology,  ?General surgery ?Neurosurgery  ? ?Procedures:  ?None ? ?Antimicrobials:  ?None ? ?Anti-infectives (From admission, onward)  ? ? None   ? ?  ? ?Subjective: ?Today, patient was seen and examined at bedside.  Denies any pain.  Wants to get out of the bed.  Denies any nausea vomiting abdominal pain.  Has been having some bowel movements.   ? ?objective: ?Vitals:  ? 05/16/21 1932 05/16/21 2340 05/17/21 0312 05/17/21 0830  ?BP: 102/75 98/73 98/77  95/80  ?Pulse: 100 86 78 84  ?Resp: 16 16 16 19   ?Temp: 98.6 ?F (37 ?C) 98 ?F (36.7 ?C) 98.4 ?F (36.9 ?C) 98.1 ?F (36.7 ?C)  ?TempSrc:  Oral  Oral  ?SpO2: 99% 99% 99% 98%  ?Weight:      ?Height:      ? ? ?Intake/Output Summary (Last 24 hours) at 05/17/2021 1137 ?Last data filed at 05/16/2021 2358 ?Gross per 24 hour  ?Intake 240 ml  ?Output 300 ml  ?Net -60 ml  ? ?Filed Weights  ? 05/13/21 1600  ?Weight: 65 kg  ? ? ?Physical Examination: ?Body mass index is 23.13 kg/m?.  ? ?General: Thinly built, not in obvious distress ?HENT:   No scleral pallor or icterus noted. Oral mucosa is moist.  ?Chest:  Clear breath sounds.  Diminished breath sounds bilaterally. No crackles or wheezes.  ?CVS: S1 &S2 heard. No murmur.  Regular rate and rhythm. ?Abdomen: Soft, nontender, nondistended.  Bowel sounds are heard.   ?Extremities: No cyanosis, clubbing or edema.  Peripheral pulses are palpable. ?Psych: Alert, awake and oriented, normal mood ?CNS:  No cranial nerve deficits.  Bilateral lower extremity weakness noted. ?Skin: Warm and dry.  Left buttock stage II ulceration present on admission. ? ?Data Reviewed:  ? ?CBC: ?Recent Labs  ?Lab 05/10/21 ?1656 05/10/21 ?1703 05/11/21 ?1415 05/12/21 ?[id number] 05/13/21 ?0051 05/14/21 ?0345 05/16/21 ?[id number] 05/17/21 ?[id number]  ?WBC 9.2  --  8.6 13.2* 6.7 6.4 7.5 9.0  ?NEUTROABS 7.4  --  6.9  --   --   --   --   --   ?HGB 11.5*   < > 10.7* 9.3* 9.5* 8.8* 10.5* 10.4*  ?HCT 35.8*   < > 32.4* 29.1* 29.5* 27.6* 32.2* 33.0*  ?MCV 94.0  --  94.7 93.9 94.9 94.8 93.9 94.8  ?PLT 391  --  385 318 324 308 356 352  ? < > = values in this interval not displayed.  ? ? ?Basic Metabolic Panel: ?Recent Labs  ?Lab  05/11/21 ?1415 05/12/21 ?[id number] 05/13/21 ?0051 05/14/21 ?0345 05/16/21 ?[id number] 05/17/21 ?[id number]  ?NA 145 143 140 142 140 141  ?K 3.4* 3.4* 3.5 3.2* 4.1 3.6  ?CL 114* 114* 112* 116* 112* 108  ?CO2 28 24 24 22 24 26   ?GLUCOSE 92 93 92 66* 72 53*  ?BUN 16 15 16 15  23* 38*  ?CREATININE 0.53* 0.56* 0.57* 0.53* 0.76 0.74  ?CALCIUM 7.4* 7.3* 7.3* 7.4* 7.9* 8.1*  ?MG 1.8  --  1.4* 1.6* 1.8 2.1  ?PHOS 2.0*  --  <1.0* 2.4*  --  4.0  ? ? ?Liver Function Tests: ?Recent Labs  ?Lab 05/10/21 ?1656 05/11/21 ?1415 05/12/21 ?[id number] 05/13/21 ?0051  ?AST 58* 47* 40 32  ?ALT 91* 70* 58* 52*  ?ALKPHOS 166* 153* 140* 124  ?BILITOT 0.9 0.7 0.4 0.5  ?PROT 5.4* 5.0*  4.1* 4.4*  ?ALBUMIN 2.0* 1.8* <1.5* 1.5*  ? ? ? ?Radiology Studies: ?No results found. ? ? ? LOS: 5 days  ? ? ?Flora Lipps, MD ?[facility] ?05/17/2021, 11:37 AM  ? ? ?

## 2021-05-17 NOTE — Progress Notes (Signed)
Occupational Therapy Treatment ?Patient Details ?Name: Logan Nelson ?MRN: [id number] ?DOB: Jul 02, 1965 ?Today's Date: 05/17/2021 ? ? ?History of present illness PT is a 33 admitted 4/3 with quickly progressing B LE weakness.  Recently hospitalize with incarcerated abdominal hernia, was repaired, but pt left AMA before he was safe to mobilize,  MRI of brain, cervical/thoracic spine inconclusive.  Work up continues.  PMHx: anemia, arthritis, dyspnea, GERD, HTN, LE edema ?  ?OT comments ? Pt is making incremental progress however is refusing continued therapy beyond toileting and hygiene. Overall he requires min A for pivotal stepping to [facility] and max A for hygiene in standing. Pt declined attempt to complete pericare in standing, therefore max A. However, anticipate pt would be able to complete pericare with at least 1UE supported in standing and min A. Pt did verbalized some insight into his deficits this date, reporting how difficult ADLs were when he was at home saying "I didn't know I was that bad off." D/c remains appropriate, OT to continue to follow acutely.  ? ?Recommendations for follow up therapy are one component of a multi-disciplinary discharge planning process, led by the attending physician.  Recommendations may be updated based on patient status, additional functional criteria and insurance authorization. ?   ?Follow Up Recommendations ? Skilled nursing-short term rehab (<3 hours/day) (May refuse and go home.)  ?  ?Assistance Recommended at Discharge Frequent or constant Supervision/Assistance  ?Patient can return home with the following ? A lot of help with walking and/or transfers;A lot of help with bathing/dressing/bathroom;Assist for transportation;Assistance with cooking/housework ?  ?Equipment Recommendations ? BSC/3in1;Tub/shower bench  ?  ?   ?Precautions / Restrictions Precautions ?Precautions: Fall ?Precaution Comments: LE weakness, very incontinent ?Restrictions ?Weight Bearing  Restrictions: No  ? ? ?  ? ?Mobility Bed Mobility ?Overal bed mobility: Needs Assistance ?Bed Mobility: Sit to Supine ?  ?Sidelying to sit: Mod assist ?  ?  ?  ?General bed mobility comments: for BLEs ?  ? ?Transfers ?Overall transfer level: Needs assistance ?Equipment used: 1 person hand held assist ?Transfers: Sit to/from Stand, Bed to chair/wheelchair/BSC ?Sit to Stand: Min assist ?  ?  ?Step pivot transfers: Min assist ?  ?  ?General transfer comment: required at least 1UE supported with transfers ?  ?  ?Balance Overall balance assessment: Needs assistance ?Sitting-balance support: Feet supported ?Sitting balance-Leahy Scale: Fair ?Sitting balance - Comments: pt. can maintain sitting balance w/o challenge EOB and on BSC ?  ?Standing balance support: Bilateral upper extremity supported ?Standing balance-Leahy Scale: Poor ?  ?  ?  ?  ?  ?  ?  ?  ?  ?  ?  ?  ?   ? ?ADL either performed or assessed with clinical judgement  ? ?ADL Overall ADL's : Needs assistance/impaired ?  ?  ?  ?  ?  ?  ?  ?  ?  ?  ?  ?  ?Toilet Transfer: Minimal assistance;Stand-pivot;BSC/3in1 ?Toilet Transfer Details (indicate cue type and reason): refused ambulation to bathroom due to BM urgency ?Toileting- Clothing Manipulation and Hygiene: Maximal assistance;Sitting/lateral lean ?Toileting - Clothing Manipulation Details (indicate cue type and reason): refused to attempt rear peri care after BM - max A in standing ?  ?  ?Functional mobility during ADLs: Minimal assistance ?General ADL Comments: refused RW, min A for steadying with pivotal steps to/from [facility]. ?  ? ?Extremity/Trunk Assessment Upper Extremity Assessment ?Upper Extremity Assessment: RUE deficits/detail;LUE deficits/detail ?RUE Deficits / Details: uncoordinated, numb and pt not  able to tell how his arm/hand is positioned without cues.Marland Kitchengeneral weakness, but moves laboriously against gravity. ?RUE Sensation: decreased light touch;decreased proprioception ?RUE Coordination: decreased  fine motor;decreased gross motor ?LUE Deficits / Details: generally weak overall at >=3/5 ?LUE Sensation: decreased light touch ?LUE Coordination: decreased fine motor ?  ?Lower Extremity Assessment ?Lower Extremity Assessment: Defer to PT evaluation ?  ?  ?  ? ?Vision   ?Vision Assessment?: No apparent visual deficits ?  ?Perception Perception ?Perception: Within Functional Limits ?  ?Praxis Praxis ?Praxis: Intact ?  ? ?Cognition Arousal/Alertness: Awake/alert ?Behavior During Therapy: [facility] for tasks assessed/performed ?Overall Cognitive Status: Impaired/Different from baseline ?Area of Impairment: Problem solving, Safety/judgement, Awareness ?  ?  ?  ?  ?  ?  ?  ?  ?Orientation Level: Disoriented to ?Current Attention Level: Selective ?Memory: Decreased recall of precautions ?Following Commands: Follows one step commands with increased time ?Safety/Judgement: Decreased awareness of safety, Decreased awareness of deficits ?Awareness: Emergent ?Problem Solving: Slow processing, Decreased initiation, Difficulty sequencing ?General Comments: Pt showing some insight into deficits this date verbalized how hard ADLs were for him while he was at home, and feeling very weak with multiple falls and unable to get up from the floor. However continues to have poor problem solving and safety awareness ?  ?  ?   ?   ?   ?General Comments VSS on RA, pt wtih large loose stool  ? ? ?Pertinent Vitals/ Pain       Pain Assessment ?Pain Assessment: Faces ?Faces Pain Scale: Hurts little more ?Pain Location: generalized ?Pain Descriptors / Indicators: Grimacing ?Pain Intervention(s): Limited activity within patient's tolerance, Monitored during session ? ? ?Frequency ? Min 2X/week  ? ? ? ? ?  ?Progress Toward Goals ? ?OT Goals(current goals can now be found in the care plan section) ? Progress towards OT goals: Progressing toward goals ? ?Acute Rehab OT Goals ?Patient Stated Goal: to walk again ?OT Goal Formulation: With patient ?Time For  Goal Achievement: 05/26/21 ?Potential to Achieve Goals: Good ?ADL Goals ?Pt Will Perform Grooming: with supervision;standing ?Pt Will Perform Lower Body Bathing: with min guard assist;sitting/lateral leans;sit to/from stand ?Pt Will Perform Upper Body Dressing: with supervision;sitting ?Pt Will Perform Lower Body Dressing: with min guard assist;sitting/lateral leans;sit to/from stand ?Pt Will Transfer to Toilet: with min assist;stand pivot transfer ?Pt Will Perform Toileting - Clothing Manipulation and hygiene: with min guard assist;sitting/lateral leans;sit to/from stand ?Pt/caregiver will Perform Home Exercise Program: Increased strength;Both right and left upper extremity;With theraband;With Supervision;With written HEP provided ?Additional ADL Goal #1: Pt will tolerate 3 mins of dynamic standing task with min guard as a precursor for safe ADL performance.  ?Plan Discharge plan remains appropriate   ? ?   ?AM-PAC OT "6 Clicks" Daily Activity     ?Outcome Measure ? ? Help from another person eating meals?: None ?Help from another person taking care of personal grooming?: A Little ?Help from another person toileting, which includes using toliet, bedpan, or urinal?: A Lot ?Help from another person bathing (including washing, rinsing, drying)?: A Lot ?Help from another person to put on and taking off regular upper body clothing?: A Little ?Help from another person to put on and taking off regular lower body clothing?: A Lot ?6 Click Score: 16 ? ?  ?End of Session Equipment Utilized During Treatment: Rolling walker (2 wheels);Gait belt ? ?OT Visit Diagnosis: Unsteadiness on feet (R26.81);Other abnormalities of gait and mobility (R26.89);Muscle weakness (generalized) (M62.81);History of falling (Z91.81) ?Hemiplegia -  Right/Left: Right ?Hemiplegia - dominant/non-dominant: Dominant ?Hemiplegia - caused by: Unspecified ?  ?Activity Tolerance Patient tolerated treatment well ?  ?Patient Left in bed;with call bell/phone  within reach;with bed alarm set ?  ?Nurse Communication Mobility status ?  ? ?   ? ?Time: [id number] ?OT Time Calculation (min): 18 min ? ?Charges: OT General Charges ?$OT Visit: 1 Visit ?OT Treatments ?$Self Care/H

## 2021-05-17 NOTE — TOC Progression Note (Signed)
Transition of Care (TOC) - Progression Note  ? ? ?Patient Details  ?Name: Logan Nelson ?MRN: [id number] ?Date of Birth: [birth year] ? ?Transition of Care (TOC) CM/SW Contact  ?Kermit Balo, RN ?Phone Number: ?05/17/2021, 2:14 PM ? ?Clinical Narrative:    ?CM was able to talk to patients disability person: [name] and she says his application has been submitted and is pending. She asked that CM reach out to his DSS worker: [name] and see if his medicaid termination be appealed or need to be restarted. CM spoke to [name] and she says he is past his appeal date and that he needs a new application submitted. CM has sent this information to financial counseling to see if a new application can be submitted. [name] was unable to submit as she has taken a promotion and that is no longer in her job description. ?Recommendations continue to be for SNF rehab. Pt has no payor.  ?TOC following.  ? ? ?Expected Discharge Plan: Skilled Nursing Facility ?Barriers to Discharge: Continued Medical Work up, Inadequate or no insurance, SNF Pending payor source - LOG ? ?Expected Discharge Plan and Services ?Expected Discharge Plan: Skilled Nursing Facility ?In-house Referral: Clinical Social Work ?Discharge Planning Services: CM Consult ?  ?Living arrangements for the past 2 months: Single Family Home ?                ?  ?  ?  ?  ?  ?  ?  ?  ?  ?  ? ? ?Social Determinants of Health (SDOH) Interventions ?  ? ?Readmission Risk Interventions ? ?  05/05/2021  ? 10:01 AM  ?Readmission Risk Prevention Plan  ?Transportation Screening Complete  ?Medication Review Oceanographer) Referral to Pharmacy  ?PCP or Specialist appointment within 3-5 days of discharge Complete  ?HRI or Home Care Consult Patient refused  ?SW Recovery Care/Counseling Consult Complete  ?Palliative Care Screening Not Applicable  ?Skilled Nursing Facility Complete  ? ? ?

## 2021-05-17 NOTE — Progress Notes (Signed)
Pt refusing attendance while toileting, being argumentative with staff, insisting on getting himself out of bed on his own. Has been reeducated on safety precautions. Continues to refuse attendance. ?

## 2021-05-17 NOTE — Progress Notes (Signed)
Physical Therapy Treatment ?Patient Details ?Name: Logan Nelson ?MRN: [id number] ?DOB: 1965-04-15 ?Today's Date: 05/17/2021 ? ? ?History of Present Illness PT is a 41 admitted 4/3 with quickly progressing B LE weakness.  Recently hospitalize with incarcerated abdominal hernia, was repaired, but pt left AMA before he was safe to mobilize,  MRI of brain, cervical/thoracic spine inconclusive.  Work up continues.  PMHx: anemia, arthritis, dyspnea, GERD, HTN, LE edema ? ?  ?PT Comments  ? ? Focus of session today was functional tranfers, short distance gait, and standing balance. The treatment was limited secondary to patient agitation and incontinence.  Pt. declines progressive gait mobility, only accepting transfer to [facility] and then to chair. He originally declines the use of the walker and uses furniture in the room to get to [facility]. During STS from [facility] he accepted use and was total A for pericare. He was left in the room with NT for catheter change. Overall balance, LE strength, functional endurance, and willingness to participate in therapy are still limiting function. Pt. Would benefit from skilled PT to continue to address his functional transfer ability, gait, LE strength, and dynamic balance. Plan and discharge setting remains unchanged. Pt was educated on importance of participating in therapy to progress his mobility and independence while in the hospital, he accepted this education. Pt to follow acutely as appropriate.  ?   ?Recommendations for follow up therapy are one component of a multi-disciplinary discharge planning process, led by the attending physician.  Recommendations may be updated based on patient status, additional functional criteria and insurance authorization. ? ?Follow Up Recommendations ? Skilled nursing-short term rehab (<3 hours/day) (Likely denied due to lack of insurance, plan to progress mobility for safe return home.) ?  ?  ?Assistance Recommended at Discharge Intermittent  Supervision/Assistance  ?Patient can return home with the following Assistance with cooking/housework;Direct supervision/assist for medications management;Direct supervision/assist for financial management;Assist for transportation;Help with stairs or ramp for entrance;A little help with walking and/or transfers;A little help with bathing/dressing/bathroom ?  ?Equipment Recommendations ? Rolling walker (2 wheels)  ?  ?   ?Precautions / Restrictions Precautions ?Precautions: Fall ?Precaution Comments: LE weakness, very incontinent ?Restrictions ?Weight Bearing Restrictions: No  ?  ? ?Mobility ? Bed Mobility ?Overal bed mobility: Needs Assistance ?Bed Mobility: Sidelying to Sit ?  ?Sidelying to sit: Mod assist (Mod A for LE management and trunk support during sit) ?  ?  ?  ?General bed mobility comments: Pt. recieved in R sidlying with L LE under R, required A to walk LE toward EOB and for sit up ?Patient Response: Impulsive ? ?Transfers ?Overall transfer level: Needs assistance ?Equipment used: 1 person hand held assist ?Transfers: Sit to/from Stand ?Sit to Stand: Min guard ?  ?Step pivot transfers: Min assist ?  ?  ?  ?General transfer comment: Pt able to self rise, denied use of RW for trasnfer to [facility] and walks hands across bed rails and chair to get to [facility] STSx2, accepts RW for stand from [facility], then migrates hands from RW, to [facility], to chair to sit in chair. ?  ? ?Ambulation/Gait ?  ?  ?  ?  ?  ?  ?  ?General Gait Details: pt. declines gait ? ? ? ?  ?Balance Overall balance assessment: Needs assistance ?Sitting-balance support: Feet supported ?Sitting balance-Leahy Scale: Fair ?Sitting balance - Comments: pt. can maintain sitting balance w/o challenge EOB and on BSC ?  ?Standing balance support: Bilateral upper extremity supported ?Standing balance-Leahy Scale: Poor ?  Standing balance comment: Required RW support during standing pericare ?  ?  ?  ?  ?  ?  ?  ?  ?  ?  ?  ?  ? ?  ?Cognition Arousal/Alertness:  Awake/alert ?Behavior During Therapy: [facility] for tasks assessed/performed ?Overall Cognitive Status: Impaired/Different from baseline ?Area of Impairment: Problem solving, Safety/judgement, Awareness ?  ?  ?  ?  ?  ?  ?  ?  ?  ?  ?  ?  ?Safety/Judgement: Decreased awareness of safety, Decreased awareness of deficits ?Awareness: Emergent ?Problem Solving: Slow processing, Decreased initiation, Difficulty sequencing ?General Comments: Pt. declining PT, saying "I'll work with you after I get my sandwhich", then 'I need to get to [facility]." Agreed for assist with trasnfer and then to chair ?  ?  ? ?  ?   ?General Comments General comments (skin integrity, edema, etc.): Pt. educated on the importance of progressive mobility and working with therapy to help him get home. He was originally not willing to work with PT and requires significant encouragement to do so. Total A for pericare, stating "how am I suppose to do that and stand?" ?  ?  ? ?Pertinent Vitals/Pain Pain Assessment ?Faces Pain Scale: Hurts a little bit ?Pain Location: generalized ?Pain Descriptors / Indicators: Grimacing ?Pain Intervention(s): Limited activity within patient's tolerance, Monitored during session, Repositioned  ? ? ? ?PT Goals (current goals can now be found in the care plan section) Acute Rehab PT Goals ?Patient Stated Goal: return to independence ?PT Goal Formulation: With patient ?Time For Goal Achievement: 05/26/21 ?Potential to Achieve Goals: Good ?Progress towards PT goals: Progressing toward goals ? ?  ?Frequency ? ? ? Min 3X/week ? ? ? ?  ?PT Plan Current plan remains appropriate  ? ? ?   ?AM-PAC PT "6 Clicks" Mobility   ?Outcome Measure ? Help needed turning from your back to your side while in a flat bed without using bedrails?: A Little ?Help needed moving from lying on your back to sitting on the side of a flat bed without using bedrails?: A Lot ?Help needed moving to and from a bed to a chair (including a wheelchair)?: A Lot ?Help  needed standing up from a chair using your arms (e.g., wheelchair or bedside chair)?: A Little ?Help needed to walk in hospital room?: A Little ?Help needed climbing 3-5 steps with a railing? : A Lot ?6 Click Score: 15 ? ?  ?End of Session   ?Activity Tolerance: Treatment limited secondary to agitation ?Patient left: in chair;with nursing/sitter in room (NT in room, per NT report she will set the alarm once she leaves) ?Nurse Communication: Mobility status ?PT Visit Diagnosis: Other abnormalities of gait and mobility (R26.89);Muscle weakness (generalized) (M62.81);History of falling (Z91.81);Pain ?Pain - part of body: Leg;Ankle and joints of foot ?  ? ? ?Time: 4166-0630 ?PT Time Calculation (min) (ACUTE ONLY): 20 min ? ?Charges:  $Therapeutic Activity: 8-22 mins          ?          ? ?Lorie Apley, SPT ?Acute Rehab Services ? ? ? ?Lorie Apley ?05/17/2021, 11:26 AM ? ?

## 2021-05-18 LAB — BASIC METABOLIC PANEL
Anion gap: 6 (ref 5–15)
BUN: 35 mg/dL — ABNORMAL HIGH (ref 6–20)
CO2: 24 mmol/L (ref 22–32)
Calcium: 7.5 mg/dL — ABNORMAL LOW (ref 8.9–10.3)
Chloride: 112 mmol/L — ABNORMAL HIGH (ref 98–111)
Creatinine, Ser: 0.75 mg/dL (ref 0.61–1.24)
GFR, Estimated: >60 mL/min (ref >60)
Glucose, Bld: 56 mg/dL — ABNORMAL LOW (ref 70–99)
Potassium: 3.1 mmol/L — ABNORMAL LOW (ref 3.5–5.1)
Sodium: 142 mmol/L (ref 135–145)

## 2021-05-18 LAB — PHOSPHORUS: Phosphorus: 3.4 mg/dL (ref 2.5–4.6)

## 2021-05-18 LAB — MAGNESIUM: Magnesium: 1.9 mg/dL (ref 1.7–2.4)

## 2021-05-18 MED ORDER — CHOLESTYRAMINE 4 G PO PACK
4.0000 g | PACK | Freq: Two times a day (BID) | ORAL | Status: DC
  Administered 2021-05-18 – 2021-06-15 (×48): 4 g via ORAL
  Filled 2021-05-18 (×61): qty 1

## 2021-05-18 MED ORDER — POTASSIUM CHLORIDE CRYS ER 20 MEQ PO TBCR
40.0000 meq | EXTENDED_RELEASE_TABLET | Freq: Two times a day (BID) | ORAL | Status: AC
  Administered 2021-05-18 – 2021-05-19 (×4): 40 meq via ORAL
  Filled 2021-05-18 (×4): qty 2

## 2021-05-18 MED ORDER — POTASSIUM CHLORIDE CRYS ER 20 MEQ PO TBCR
40.0000 meq | EXTENDED_RELEASE_TABLET | Freq: Two times a day (BID) | ORAL | Status: DC
Start: 2021-05-18 — End: 2021-05-18

## 2021-05-18 NOTE — Progress Notes (Signed)
Physical Therapy Treatment ?Patient Details ?Name: Logan Nelson ?MRN: [id number] ?DOB: [birth year] ?Today's Date: 05/18/2021 ? ? ?History of Present Illness PT is a 27 admitted 4/3 with quickly progressing B LE weakness.  Recently hospitalize with incarcerated abdominal hernia, was repaired, but pt left AMA before he was safe to mobilize,  MRI of brain, cervical/thoracic spine inconclusive.  Work up continues.  PMHx: anemia, arthritis, dyspnea, GERD, HTN, LE edema ? ?  ?PT Comments  ? ? On first attempt to see pt, pt refused, insisting on eating oatmeal first. Upon returning, pt frustrated with therapist's return stating "don't y'all understand I need a break". With maximal encouragement and education on benefits of OOB mobility, pt agreed to walk "to the door and back" but refused RW or gait belt stating "you're not tying me up like a dog". Pt transferred sit<>stand with L HHA and light min A and ambulated in room with L HHA and light min A. Pt requested to be left sitting EOB with all needs within reach. Acute PT to cont to follow.  ?   ?Recommendations for follow up therapy are one component of a multi-disciplinary discharge planning process, led by the attending physician.  Recommendations may be updated based on patient status, additional functional criteria and insurance authorization. ? ?Follow Up Recommendations ? Skilled nursing-short term rehab (<3 hours/day) ?  ?  ?Assistance Recommended at Discharge Intermittent Supervision/Assistance  ?Patient can return home with the following Assistance with cooking/housework;Direct supervision/assist for medications management;Direct supervision/assist for financial management;Assist for transportation;Help with stairs or ramp for entrance;A little help with walking and/or transfers;A little help with bathing/dressing/bathroom ?  ?Equipment Recommendations ? Rolling walker (2 wheels)  ?  ?Recommendations for Other Services   ? ? ?  ?Precautions / Restrictions  Precautions ?Precautions: Fall ?Restrictions ?Weight Bearing Restrictions: No  ?  ? ?Mobility ? Bed Mobility ?  ?  ?  ?  ?  ?  ?  ?General bed mobility comments: sitting EOB upon therapist's arrival ?Patient Response: Flat affect ? ?Transfers ?Overall transfer level: Needs assistance ?Equipment used: 1 person hand held assist ?Transfers: Sit to/from Stand ?Sit to Stand: Min assist ?  ?  ?  ?  ?  ?General transfer comment: refused RW and gait belt. Stood from EOB with L HHA ?  ? ?Ambulation/Gait ?Ambulation/Gait assistance: Min assist ?Gait Distance (Feet): 30 Feet ?Assistive device: 1 person hand held assist ?Gait Pattern/deviations: Decreased stride length, Step-to pattern, Decreased step length - right, Decreased step length - left, Narrow base of support ?Gait velocity: decreased ?Gait velocity interpretation: <1.8 ft/sec, indicate of risk for recurrent falls ?  ?  ? ? ?Stairs ?  ?  ?  ?  ?  ? ? ?Wheelchair Mobility ?  ? ?Modified Rankin (Stroke Patients Only) ?  ? ? ?  ?Balance Overall balance assessment: Needs assistance ?Sitting-balance support: Feet supported, Bilateral upper extremity supported ?Sitting balance-Leahy Scale: Fair ?Sitting balance - Comments: sitting EOB upon therapist's arrival ?  ?Standing balance support: Single extremity supported, During functional activity ?Standing balance-Leahy Scale: Poor ?Standing balance comment: requires min guard for static standing balance and light min A for dynamic standing balance ?  ?  ?  ?  ?  ?  ?  ?  ?  ?  ?  ?  ? ?  ?Cognition Arousal/Alertness: Awake/alert ?Behavior During Therapy: [facility] for tasks assessed/performed ?Overall Cognitive Status: Impaired/Different from baseline ?Area of Impairment: Problem solving, Safety/judgement, Awareness ?  ?  ?  ?  ?  ?  ?  ?  ?  ?  ?  ?  ?  Safety/Judgement: Decreased awareness of safety, Decreased awareness of deficits ?  ?Problem Solving: Slow processing, Decreased initiation, Difficulty sequencing ?General Comments:  pt demonstrating poor safety awareness, refusing to use RW or allow therapist to don gait belt ?  ?  ? ?  ?Exercises   ? ?  ?General Comments General comments (skin integrity, edema, etc.): pt required maximal encouragement to participate ?  ?  ? ?Pertinent Vitals/Pain Pain Assessment ?Pain Assessment: No/denies pain  ? ? ?Home Living   ?  ?  ?  ?  ?  ?  ?  ?  ?  ?   ?  ?Prior Function    ?  ?  ?   ? ?PT Goals (current goals can now be found in the care plan section) Acute Rehab PT Goals ?Patient Stated Goal: return to independence ?PT Goal Formulation: With patient ?Time For Goal Achievement: 05/26/21 ?Potential to Achieve Goals: Good ?Progress towards PT goals: Progressing toward goals ? ?  ?Frequency ? ? ? Min 3X/week ? ? ? ?  ?PT Plan Current plan remains appropriate  ? ? ?Co-evaluation   ?  ?  ?  ?  ? ?  ?AM-PAC PT "6 Clicks" Mobility   ?Outcome Measure ? Help needed turning from your back to your side while in a flat bed without using bedrails?: A Little ?Help needed moving from lying on your back to sitting on the side of a flat bed without using bedrails?: A Lot ?Help needed moving to and from a bed to a chair (including a wheelchair)?: A Little ?Help needed standing up from a chair using your arms (e.g., wheelchair or bedside chair)?: A Little ?Help needed to walk in hospital room?: A Little ?Help needed climbing 3-5 steps with a railing? : A Lot ?6 Click Score: 16 ? ?  ?End of Session   ?Activity Tolerance: Patient limited by fatigue ?Patient left: in bed;with call bell/phone within reach;with bed alarm set ?Nurse Communication: Mobility status ?PT Visit Diagnosis: Other abnormalities of gait and mobility (R26.89);Muscle weakness (generalized) (M62.81);History of falling (Z91.81) ?  ? ? ?Time: [id number] ?PT Time Calculation (min) (ACUTE ONLY): 10 min ? ?Charges:  $Gait Training: 8-22 mins          ?          ?Becky Sax PT, DPT  ?Blenda Nicely ?05/18/2021, 9:58 AM ? ?

## 2021-05-18 NOTE — Progress Notes (Signed)
Nutrition Follow-up ? ?DOCUMENTATION CODES:  ?Severe malnutrition in context of chronic illness ? ?INTERVENTION:  ?-continue Boost Breeze po TID, each supplement provides 250 kcal and 9 grams of protein ?-continue Magic cup TID with meals, each supplement provides 290 kcal and 9 grams of protein ?-continue double protein portions TID w/ meals ?-continue MVI with minerals daily ?-Continue vitamin D 50,000 units weekly given vitamin D deficiency ?-continue vitamin A 10,000 units daily x 1 month given previously identified vitamin A deficiency ?-continue zinc sulfate 220 mg BID x 14 days given previously identified zinc deficiency ? ?NUTRITION DIAGNOSIS:  ?Severe Malnutrition related to chronic illness as evidenced by severe fat depletion, severe muscle depletion, percent weight loss (17.9% weight loss in less than 7 months). ?ongoing ? ?GOAL:  ?Patient will meet greater than or equal to 90% of their needs ?progressing ? ?MONITOR:  ?PO intake, Supplement acceptance, Labs, Weight trends, Skin, I & O's ? ?REASON FOR ASSESSMENT:  ?Malnutrition Screening Tool, Consult ?Assessment of nutrition requirement/status ? ?ASSESSMENT:  ?56 year old male who presented to the ED on 4/03 with fatigue, weakness, LE edema. PMH of severe malnutrition, HTN, anasarca, GERD, abdominal wall hernia. Pt with recent admission for abdominal hernia with incarceration s/p repair requiring TPN during admission. Pt left AMA from this admission. ? ?Pt evaluated again by PT today and is being recommended for SNF placement. Pt continues to have diarrhea -- C.diff and GI panel negative. Pt is on PRN lomotil and loperamide without improvement. MD trying cholestyramine today. MD noted possible bile acid associated diarrhea as pt reports diarrhea beginning after cholecystectomy.  ?Note pt with anasarca. Continue current nutrition plan of care as pt with increased nutrient needs 2/2 malnutrition  ? ?PO Intake: 50-100% x 5 recorded meals (75% avg meal  intake)  ? ?Medications: ? cholestyramine  4 g Oral BID  ? feeding supplement  237 mL Oral TID BM  ? multivitamin with minerals  1 tablet Oral Daily  ? pantoprazole  40 mg Oral Daily  ? potassium chloride  40 mEq Oral BID  ? vitamin A  10,000 Units Oral Daily  ? Vitamin D (Ergocalciferol)  50,000 Units Oral Q7 days  ? zinc sulfate  220 mg Oral BID  ? ?Labs: ?Recent Labs  ?Lab 05/14/21 ?0345 05/16/21 ?[id number] 05/17/21 ?[id number] 05/18/21 ?[id number]  ?NA 142 140 141 142  ?K 3.2* 4.1 3.6 3.1*  ?CL 116* 112* 108 112*  ?CO2 22 24 26 24   ?BUN 15 23* 38* 35*  ?CREATININE 0.53* 0.76 0.74 0.75  ?CALCIUM 7.4* 7.9* 8.1* 7.5*  ?MG 1.6* 1.8 2.1 1.9  ?PHOS 2.4*  --  4.0 3.4  ?GLUCOSE 66* 72 53* 56*  ? ?Diet Order:   ?Diet Order   ? ?       ?  Diet regular Room service appropriate? Yes; Fluid consistency: Thin  Diet effective now       ?  ? ?  ?  ? ?  ? ?EDUCATION NEEDS:  ?Education needs have been addressed ? ?Skin:  Skin Assessment: Skin Integrity Issues: ?Skin Integrity Issues:: Stage II, Incisions ?Stage II: L buttock ?Incisions: closed abdomen ? ?Last BM:  05/12/21 multiple type 7 ? ?Height:  ?Ht Readings from Last 1 Encounters:  ?05/13/21 5\' 6"  (1.676 m)  ? ?Weight: ?Wt Readings from Last 1 Encounters:  ?05/13/21 65 kg  ? ?BMI:  Body mass index is 23.13 kg/m?. ? ?Estimated Nutritional Needs:  ?Kcal:  2100-2300 ?Protein:  100-115 grams ?Fluid:  >2.0 L ? ? ?  Theone Stanley., MS, RD, LDN (she/her/hers) ?RD pager number and weekend/on-call pager number located in [place]. ?

## 2021-05-18 NOTE — Progress Notes (Signed)
?PROGRESS NOTE ? ? ? ?Logan Nelson  [id number] DOB: [birth year] DOA: 05/10/2021 ?PCP: [name], MD  ? ? ?Brief Narrative:  ?Logan Nelson is a 56 y.o. male with past medical history of severe protein calorie malnutrition, hypertension, anasarca, abdominal wall hernia presented to the hospital with bilateral lower extremity weakness.  Patient was recently hospitalized for abdominal hernia with incarceration and had a open primary repair but then had left AMA before he was placed in rehab.  Patient was admitted this time with ambulatory dysfunction and bilateral lower extremity weakness. ? ?Assessment and plan ? ?Principal Problem: ?  Lower extremity weakness ?Active Problems: ?  Hypertension ?  Protein-calorie malnutrition, severe ?  Hypokalemia ?  Anemia of chronic disease ?  GERD (gastroesophageal reflux disease) ?  Generalized weakness ?  Hypophosphatemia ?  Hypomagnesemia ?  Vitamin D deficiency ?  Pressure injury of buttock, stage 2 ([place]) ?  ? ?BLE weakness/RUE weakness/ambulatory dysfunction, deconditioning ?Thought to be secondary to deconditioning and severe malnutrition.  Seen by neurology.  There was some enhancement on the MRI scan of the spine.  Neurosurgery was consulted and this looks like nonspecific finding .  No signs of infection.  Vitamin B1 level at 127.  Vitamin B12 at 573.   TSH at 2.9.  Vitamin D level very low at 19.7.  We will continue with high-dose of vitamin D supplementation q. weekly for now. Patient has been reassessed by physical therapy again on 05/18/2021 and recommend skilled nursing facility placement.   ? ?Severe protein-calorie malnutrition with mild hyperglycemia. ?Present on admission.  Continue supplementation.  Albumin at 1.8.  Will encourage oral nutrition.  Continue Ensure supplements. ?  ?Anasarca ? secondary to severe malnutrition and low albumin.  Encouraged oral intake. ?  ?S/p open primary repair of chronically incarcerated ventral incisional hernia,  primary right inguinal hernia repair without mesh, extensive lysis of adhesions on 04/30/2021. ? Continue dressing  ? ?Diarrhea. ?Still continues to have a watery diarrhea.  Negative C. difficile screen.  GI pathogen panel was negative.    On as needed Lomotil and loperamide but not improved.  We will try cholestyramine today.  Could be bile acid associated diarrhea.  He states that his diarrhea started after cholecystectomy.   ? ?HTN ?Borderline low.  Not on any medication.  Continue to monitor. ? ?Anemia of chronic disease ?Latest hemoglobin of 10.4.  We will continue to monitor. ?    ?GERD ?On Protonix. ?  ?Hypokalemia ?Received aggressive repletion during hospitalization.  Potassium of 3.1 again today.  Continue to replenish orally.  Check BMP in a.m. ? ?Hypophosphatemia.  Replenished during hospitalization.  Phosphorus of 3.4 today. ? ?Vitamin D deficiency .   Vitamin D level at 19.79.  Continue 50,000 unit vitamin D q. weekly while in the hospital  ? ?Hypomagnesemia.  Has been getting replacement through IV and oral.  Has improved at this time.  Latest magnesium of 1.9.  ? ?Pressure ulceration left buttocks stage II.  Present on admission. ?Pressure Injury 05/04/21 Buttocks Left Stage 2 -  Partial thickness loss of dermis presenting as a shallow open injury with a red, pink wound bed without slough. small dime sized skin tear (Active)  ?05/04/21 0800  ?Location: Buttocks  ?Location Orientation: Left  ?Staging: Stage 2 -  Partial thickness loss of dermis presenting as a shallow open injury with a red, pink wound bed without slough.  ?Wound Description (Comments): small dime sized skin tear  ?Present on Admission:  No  ?Continue wound care. ?  ? DVT prophylaxis: SCDs Start: 05/11/21 2009 ? ? ?Code Status:   ?  Code Status: Full Code ? ?Disposition: ?Skilled nursing facility placement as per PT, TOC on board.  Filing for disability for insurance, likely homeless status. ? ?Status is: Inpatient ? ?The patient is  inpatient because:  ambulatory dysfunction, need for rehabilitation as per PT, persistent diarrhea ? ? Family Communication:  ?Spoke with the patient at bedside  ? ?Consultants:  ?Neurology,  ?General surgery ?Neurosurgery  ? ?Procedures:  ?None ? ?Antimicrobials:  ?None ? ?Anti-infectives (From admission, onward)  ? ? None  ? ?  ? ?Subjective: ?Today, patient was seen and examined at bedside.  Patient denies any headache, nausea, vomiting but has been having multiple episodes of watery diarrhea as per the nursing staff.   ? ?objective: ?Vitals:  ? 05/17/21 2006 05/17/21 2336 05/18/21 0417 05/18/21 0801  ?BP: 107/82 105/75 103/75 96/66  ?Pulse: 81 97 78 65  ?Resp: 17 16 17 19   ?Temp: 97.7 ?F (36.5 ?C) 98.1 ?F (36.7 ?C) 98 ?F (36.7 ?C) 97.9 ?F (36.6 ?C)  ?TempSrc: Oral Oral  Oral  ?SpO2:  100% 100% 98%  ?Weight:      ?Height:      ? ? ?Intake/Output Summary (Last 24 hours) at 05/18/2021 1134 ?Last data filed at 05/17/2021 1220 ?Gross per 24 hour  ?Intake 351 ml  ?Output --  ?Net 351 ml  ? ?Filed Weights  ? 05/13/21 1600  ?Weight: 65 kg  ? ? ?Physical Examination: ?Body mass index is 23.13 kg/m?.  ? ?General: Thinly built, not in obvious distress appears older than stated age ?HENT:   No scleral pallor or icterus noted. Oral mucosa is moist.  ?Chest:  Clear breath sounds.  Diminished breath sounds bilaterally. No crackles or wheezes.  ?CVS: S1 &S2 heard. No murmur.  Regular rate and rhythm. ?Abdomen: Soft, nontender, distended abdomen, bowel sounds are heard.   ?Extremities: No cyanosis, clubbing with lower extremity pitting edema peripheral pulses are palpable. ?Psych: Alert, awake and oriented, normal mood ?CNS:  No cranial nerve deficits.  Bilateral lower extremity weakness, ?Skin: Warm and dry.  Left buttocks stage II ulceration present on admission ? ?Data Reviewed:  ? ?CBC: ?Recent Labs  ?Lab 05/11/21 ?1415 05/12/21 ?[id number] 05/13/21 ?0051 05/14/21 ?0345 05/16/21 ?[id number] 05/17/21 ?[id number]  ?WBC 8.6 13.2* 6.7 6.4 7.5 9.0   ?NEUTROABS 6.9  --   --   --   --   --   ?HGB 10.7* 9.3* 9.5* 8.8* 10.5* 10.4*  ?HCT 32.4* 29.1* 29.5* 27.6* 32.2* 33.0*  ?MCV 94.7 93.9 94.9 94.8 93.9 94.8  ?PLT 385 318 324 308 356 352  ? ? ?Basic Metabolic Panel: ?Recent Labs  ?Lab 05/11/21 ?1415 05/12/21 ?[id number] 05/13/21 ?0051 05/14/21 ?0345 05/16/21 ?[id number] 05/17/21 ?[id number] 05/18/21 ?[id number]  ?NA 145   < > 140 142 140 141 142  ?K 3.4*   < > 3.5 3.2* 4.1 3.6 3.1*  ?CL 114*   < > 112* 116* 112* 108 112*  ?CO2 28   < > 24 22 24 26 24   ?GLUCOSE 92   < > 92 66* 72 53* 56*  ?BUN 16   < > 16 15 23* 38* 35*  ?CREATININE 0.53*   < > 0.57* 0.53* 0.76 0.74 0.75  ?CALCIUM 7.4*   < > 7.3* 7.4* 7.9* 8.1* 7.5*  ?MG 1.8  --  1.4* 1.6* 1.8 2.1 1.9  ?PHOS 2.0*  --  <1.0*  2.4*  --  4.0 3.4  ? < > = values in this interval not displayed.  ? ? ?Liver Function Tests: ?Recent Labs  ?Lab 05/11/21 ?1415 05/12/21 ?[id number] 05/13/21 ?0051  ?AST 47* 40 32  ?ALT 70* 58* 52*  ?ALKPHOS 153* 140* 124  ?BILITOT 0.7 0.4 0.5  ?PROT 5.0* 4.1* 4.4*  ?ALBUMIN 1.8* <1.5* 1.5*  ? ? ? ?Radiology Studies: ?No results found. ? ? ? LOS: 6 days  ? ? ?Flora Lipps, MD ?[facility] ?05/18/2021, 11:34 AM  ? ? ?

## 2021-05-18 NOTE — Plan of Care (Signed)
Pt continues to have diarrhea, although pt reports feeling like diarrhea frequency is decreasing due to new medications.  Pt otherwise stable throughout shift.  ? ?Problem: Nutrition: ?Goal: Adequate nutrition will be maintained ?Outcome: Not Progressing ?  ?Problem: Elimination: ?Goal: Will not experience complications related to bowel motility ?Outcome: Not Progressing ?  ?Problem: Pain Managment: ?Goal: General experience of comfort will improve ?Outcome: Not Progressing ?  ?Problem: Education: ?Goal: Knowledge of General Education information will improve ?Description: Including pain rating scale, medication(s)/side effects and non-pharmacologic comfort measures ?Outcome: Progressing ?  ?Problem: Health Behavior/Discharge Planning: ?Goal: Ability to manage health-related needs will improve ?Outcome: Progressing ?  ?Problem: Clinical Measurements: ?Goal: Ability to maintain clinical measurements within normal limits will improve ?Outcome: Progressing ?Goal: Will remain free from infection ?Outcome: Progressing ?Goal: Diagnostic test results will improve ?Outcome: Progressing ?Goal: Respiratory complications will improve ?Outcome: Progressing ?Goal: Cardiovascular complication will be avoided ?Outcome: Progressing ?  ?Problem: Activity: ?Goal: Risk for activity intolerance will decrease ?Outcome: Progressing ?  ?Problem: Coping: ?Goal: Level of anxiety will decrease ?Outcome: Progressing ?  ?Problem: Elimination: ?Goal: Will not experience complications related to urinary retention ?Outcome: Progressing ?  ?Problem: Safety: ?Goal: Ability to remain free from injury will improve ?Outcome: Progressing ?  ?Problem: Skin Integrity: ?Goal: Risk for impaired skin integrity will decrease ?Outcome: Progressing ?  ?

## 2021-05-19 LAB — BASIC METABOLIC PANEL
Anion gap: 8 (ref 5–15)
BUN: 36 mg/dL — ABNORMAL HIGH (ref 6–20)
CO2: 19 mmol/L — ABNORMAL LOW (ref 22–32)
Calcium: 7.8 mg/dL — ABNORMAL LOW (ref 8.9–10.3)
Chloride: 114 mmol/L — ABNORMAL HIGH (ref 98–111)
Creatinine, Ser: 0.83 mg/dL (ref 0.61–1.24)
GFR, Estimated: >60 mL/min (ref >60)
Glucose, Bld: 64 mg/dL — ABNORMAL LOW (ref 70–99)
Potassium: 3.6 mmol/L (ref 3.5–5.1)
Sodium: 141 mmol/L (ref 135–145)

## 2021-05-19 LAB — CBC
HCT: 31.2 % — ABNORMAL LOW (ref 39.0–52.0)
Hemoglobin: 9.8 g/dL — ABNORMAL LOW (ref 13.0–17.0)
MCH: 30.2 pg (ref 26.0–34.0)
MCHC: 31.4 g/dL (ref 30.0–36.0)
MCV: 96 fL (ref 80.0–100.0)
Platelets: 285 10*3/uL (ref 150–400)
RBC: 3.25 MIL/uL — ABNORMAL LOW (ref 4.22–5.81)
RDW: 16.3 % — ABNORMAL HIGH (ref 11.5–15.5)
WBC: 8.8 10*3/uL (ref 4.0–10.5)
nRBC: 0 % (ref 0.0–0.2)

## 2021-05-19 LAB — MAGNESIUM: Magnesium: 2.1 mg/dL (ref 1.7–2.4)

## 2021-05-19 NOTE — Progress Notes (Signed)
PT Cancellation Note ? ?Patient Details ?Name: Logan Nelson ?MRN: [id number] ?DOB: 16-Mar-1965 ? ? ?Cancelled Treatment:    Reason Eval/Treat Not Completed: Patient declined, no reason specified- Pt. refusing x2, stating, "just leave me alone." PT to follow up acutely as able.  ? ?Lorie Apley, SPT ?Acute Rehab Services ?  ? ? ?Lorie Apley ?05/19/2021, 11:14 AM ?

## 2021-05-19 NOTE — Progress Notes (Signed)
?PROGRESS NOTE ? ? ? ?Logan Nelson  [id number] DOB: 04-08-1965 DOA: 05/10/2021 ?PCP: [name], MD  ? ? ? ?Brief Narrative:  ?Logan Nelson is a 56 y.o. male with past medical history of severe protein calorie malnutrition, hypertension, anasarca, abdominal wall hernia presented to the hospital with bilateral lower extremity weakness.  Patient was recently hospitalized for abdominal hernia with incarceration and had a open primary repair but then had left AMA before he was placed in rehab.  Patient was admitted this time with ambulatory dysfunction and bilateral lower extremity weakness. ? ?Assessment and plan ? ?Principal Problem: ?  Lower extremity weakness ?Active Problems: ?  Hypertension ?  Protein-calorie malnutrition, severe ?  Hypokalemia ?  Anemia of chronic disease ?  GERD (gastroesophageal reflux disease) ?  Generalized weakness ?  Hypophosphatemia ?  Hypomagnesemia ?  Vitamin D deficiency ?  Pressure injury of buttock, stage 2 ([place]) ?  ? ?BLE weakness/RUE weakness/ambulatory dysfunction, deconditioning ?Thought to be secondary to deconditioning and severe malnutrition.  Seen by neurology.  There was some enhancement on the MRI scan of the spine.  Neurosurgery was consulted and this looks like nonspecific finding .  No signs of infection.  Vitamin B1 level at 127.  Vitamin B12 at 573.   TSH at 2.9.  Vitamin D level very low at 19.7.  We will continue with high-dose of vitamin D supplementation q. weekly for now. Patient has been reassessed by physical therapy again on 05/18/2021 and recommend skilled nursing facility placement.   ? ?Severe protein-calorie malnutrition with mild hyperglycemia. ?Present on admission.  Continue supplementation.  Albumin at 1.8.  Will encourage oral nutrition.  Continue Ensure supplements. ?  ?Anasarca ? secondary to severe malnutrition and low albumin.  Encouraged oral intake. ?  ?S/p open primary repair of chronically incarcerated ventral incisional  hernia, primary right inguinal hernia repair without mesh, extensive lysis of adhesions on 04/30/2021. ? Continue dressing  ? ?Diarrhea. ?Still continues to have a watery diarrhea.  Negative C. difficile screen.  GI pathogen panel was negative.    On as needed Lomotil and loperamide but not improved.  We will try cholestyramine today.  Could be bile acid associated diarrhea.  He states that his diarrhea started after cholecystectomy.   ? ?HTN ?Borderline low.  Not on any medication.  Continue to monitor. ? ?Anemia of chronic disease ?Latest hemoglobin of 10.4.  We will continue to monitor. ?    ?GERD ?On Protonix. ?  ?Hypokalemia ?Received aggressive repletion during hospitalization.  Potassium of 3.1 again today.  Continue to replenish orally.  Check BMP in a.m. ? ?Hypophosphatemia.  Replenished during hospitalization.  Phosphorus of 3.4 today. ? ?Vitamin D deficiency .   Vitamin D level at 19.79.  Continue 50,000 unit vitamin D q. weekly while in the hospital  ? ?Hypomagnesemia.  Has been getting replacement through IV and oral.  Has improved at this time.  Latest magnesium of 1.9. ? ? ? ? ?Nutritional Assessment: ?The patient?s BMI is: Body mass index is 23.13 kg/m?Marland KitchenMarland Kitchen ?Seen by dietician.  I agree with the assessment and plan as outlined below: ?Nutrition Status: ?Nutrition Problem: Severe Malnutrition ?Etiology: chronic illness ?Signs/Symptoms: severe fat depletion, severe muscle depletion, percent weight loss (17.9% weight loss in less than 7 months) ?Percent weight loss: 17.9 % ?Interventions: MVI, Boost Breeze, Refer to RD note for recommendations, Other (Comment), Magic cup (vitamin D, double protein) ? ?. ? ? ?Skin Assessment: ?I have examined the patient?s skin and  I agree with the wound assessment as performed by the wound care RN as outlined below: ?Pressure Injury 05/04/21 Buttocks Left Stage 2 -  Partial thickness loss of dermis presenting as a shallow open injury with a red, pink wound bed without  slough. small dime sized skin tear (Active)  ?05/04/21 0800  ?Location: Buttocks  ?Location Orientation: Left  ?Staging: Stage 2 -  Partial thickness loss of dermis presenting as a shallow open injury with a red, pink wound bed without slough.  ?Wound Description (Comments): small dime sized skin tear  ?Present on Admission: No  ?Dressing Type None (pt refuses mepilex border) 05/19/21 0820  ? ? ? ?I have Reviewed nursing notes, Vitals, pain scores, I/o's, Lab results and  imaging results since pt's last encounter, details please see discussion above  ?I ordered the following labs:  ?Unresulted Labs (From admission, onward)  ? ? None  ? ?  ? ? ? ?DVT prophylaxis: SCDs Start: 05/11/21 2009 ? ? ?Code Status:   Code Status: Full Code ? ?Family Communication: patient ?Disposition:  ? ?Status is: Inpatient ? ?Dispo: The patient is from: home ?             Anticipated d/c is to: TBD ?             Anticipated d/c date is: TBD ? ?Antimicrobials:   ?Anti-infectives (From admission, onward)  ? ? None  ? ?  ? ? ? ? ?Subjective: ? ?He is in bed, denies new complaints, refused to be examined ?RN reports he has diarrhea one hr aftereating but diarrhea appears to be slowing down ? ? ? ?Objective: ?Vitals:  ? 05/18/21 2353 05/19/21 0411 05/19/21 0817 05/19/21 1153  ?BP: 95/72 101/78 (!) 92/57 102/79  ?Pulse: 78 76 81 99  ?Resp: 20 20 18 20   ?Temp: 97.8 ?F (36.6 ?C) 97.6 ?F (36.4 ?C) 97.7 ?F (36.5 ?C) 97.6 ?F (36.4 ?C)  ?TempSrc: Oral Oral Oral Oral  ?SpO2: 97% 99% 99% 99%  ?Weight:      ?Height:      ? ? ?Intake/Output Summary (Last 24 hours) at 05/19/2021 1245 ?Last data filed at 05/19/2021 0820 ?Gross per 24 hour  ?Intake 240 ml  ?Output --  ?Net 240 ml  ? ?Filed Weights  ? 05/13/21 1600  ?Weight: 65 kg  ? ? ?Examination: ? ?General exam: thin , malnourished,  ?Respiratory system: Respiratory effort normal. ?Psychiatry: no agitation, flat, refused to be examined  ? ? ? ?Data Reviewed: I have personally reviewed  labs and visualized   imaging studies since the last encounter and formulate the plan  ? ? ? ? ? ? ?Scheduled Meds: ? aspirin  81 mg Oral Daily  ? cholestyramine  4 g Oral BID  ? feeding supplement  237 mL Oral TID BM  ? multivitamin with minerals  1 tablet Oral Daily  ? pantoprazole  40 mg Oral Daily  ? potassium chloride  40 mEq Oral BID  ? vitamin A  10,000 Units Oral Daily  ? Vitamin D (Ergocalciferol)  50,000 Units Oral Q7 days  ? zinc sulfate  220 mg Oral BID  ? ?Continuous Infusions: ? ? LOS: 7 days  ? ? ? ? ?Florencia Reasons, MD PhD FACP ?[facility] ? ?Available via Epic secure chat 7am-7pm for nonurgent issues ?Please page for urgent issues ?To page the attending provider between 7A-7P or the covering provider during after hours 7P-7A, please log into the web site [email or web address] and access using universal  [place] password for that web site. If you do not have the password, please call the hospital operator. ? ? ? ?05/19/2021, 12:45 PM  ? ? ?

## 2021-05-20 LAB — BASIC METABOLIC PANEL
Anion gap: 3 — ABNORMAL LOW (ref 5–15)
BUN: 31 mg/dL — ABNORMAL HIGH (ref 6–20)
CO2: 19 mmol/L — ABNORMAL LOW (ref 22–32)
Calcium: 7.8 mg/dL — ABNORMAL LOW (ref 8.9–10.3)
Chloride: 116 mmol/L — ABNORMAL HIGH (ref 98–111)
Creatinine, Ser: 0.69 mg/dL (ref 0.61–1.24)
GFR, Estimated: >60 mL/min (ref >60)
Glucose, Bld: 82 mg/dL (ref 70–99)
Potassium: 4.2 mmol/L (ref 3.5–5.1)
Sodium: 138 mmol/L (ref 135–145)

## 2021-05-20 NOTE — Progress Notes (Signed)
?PROGRESS NOTE ? ? ? ?Logan Nelson  [id number] DOB: 07/21/1965 DOA: 05/10/2021 ?PCP: [name], MD  ? ? ? ?Brief Narrative:  ?Rommell Mccumbers is a 56 y.o. male with past medical history of severe protein calorie malnutrition, hypertension, anasarca, abdominal wall hernia presented to the hospital with bilateral lower extremity weakness.  Patient was recently hospitalized for abdominal hernia with incarceration and had a open primary repair but then had left AMA before he was placed in rehab.  Patient was admitted this time with ambulatory dysfunction and bilateral lower extremity weakness. ? ?Assessment and plan ? ?Principal Problem: ?  Lower extremity weakness ?Active Problems: ?  Hypertension ?  Protein-calorie malnutrition, severe ?  Hypokalemia ?  Anemia of chronic disease ?  GERD (gastroesophageal reflux disease) ?  Generalized weakness ?  Hypophosphatemia ?  Hypomagnesemia ?  Vitamin D deficiency ?  Pressure injury of buttock, stage 2 ([place]) ?  ? ?BLE weakness/RUE weakness/ambulatory dysfunction, deconditioning ?Thought to be secondary to deconditioning and severe malnutrition.  Seen by neurology.  There was some enhancement on the MRI scan of the spine.  Neurosurgery was consulted and this looks like nonspecific finding .  No signs of infection.  Vitamin B1 level at 127.  Vitamin B12 at 573.   TSH at 2.9.  Vitamin D level very low at 19.7.  We will continue with high-dose of vitamin D supplementation q. weekly for now. Patient has been reassessed by physical therapy again on 05/18/2021 and recommend skilled nursing facility placement.   ? ?Severe protein-calorie malnutrition with mild hyperglycemia. ?Present on admission.  Continue supplementation.  Albumin at 1.8.  Will encourage oral nutrition.  Continue Ensure supplements. ?  ?Anasarca ? secondary to severe malnutrition and low albumin.  Encouraged oral intake. ?  ?S/p open primary repair of chronically incarcerated ventral incisional  hernia, primary right inguinal hernia repair without mesh, extensive lysis of adhesions on 04/30/2021. ? Continue dressing  ? ?Diarrhea. ?Still continues to have a watery diarrhea.  Negative C. difficile screen.  GI pathogen panel was negative.    On as needed Lomotil and loperamide but not improved.  We will try cholestyramine today.  Could be bile acid associated diarrhea.  He states that his diarrhea started after cholecystectomy.   ? ?HTN ?Borderline low.  Not on any medication.  Continue to monitor. ? ?Anemia of chronic disease ?Latest hemoglobin of 10.4.  We will continue to monitor. ?    ?GERD ?On Protonix. ?  ?Hypokalemia ?Received aggressive repletion during hospitalization.  Potassium of 3.1 again today.  Continue to replenish orally.  Check BMP in a.m. ? ?Hypophosphatemia.  Replenished during hospitalization.  Phosphorus of 3.4 today. ? ?Vitamin D deficiency .   Vitamin D level at 19.79.  Continue 50,000 unit vitamin D q. weekly while in the hospital  ? ?Hypomagnesemia.  Has been getting replacement through IV and oral.  Has improved at this time.  Latest magnesium of 1.9. ? ? ? ? ?Nutritional Assessment: ?The patient?s BMI is: Body mass index is 23.13 kg/m?Marland KitchenMarland Kitchen ?Seen by dietician.  I agree with the assessment and plan as outlined below: ?Nutrition Status: ?Nutrition Problem: Severe Malnutrition ?Etiology: chronic illness ?Signs/Symptoms: severe fat depletion, severe muscle depletion, percent weight loss (17.9% weight loss in less than 7 months) ?Percent weight loss: 17.9 % ?Interventions: MVI, Boost Breeze, Refer to RD note for recommendations, Other (Comment), Magic cup (vitamin D, double protein) ? ?. ? ? ?Skin Assessment: ?I have examined the patient?s skin and  I agree with the wound assessment as performed by the wound care RN as outlined below: ?Pressure Injury 05/04/21 Buttocks Left Stage 2 -  Partial thickness loss of dermis presenting as a shallow open injury with a red, pink wound bed without  slough. small dime sized skin tear (Active)  ?05/04/21 0800  ?Location: Buttocks  ?Location Orientation: Left  ?Staging: Stage 2 -  Partial thickness loss of dermis presenting as a shallow open injury with a red, pink wound bed without slough.  ?Wound Description (Comments): small dime sized skin tear  ?Present on Admission: No  ?Dressing Type None (pt refusing foam dressing) 05/20/21 0855  ? ? ? ?I have Reviewed nursing notes, Vitals, pain scores, I/o's, Lab results and  imaging results since pt's last encounter, details please see discussion above  ?I ordered the following labs:  ?Unresulted Labs (From admission, onward)  ? ? None  ? ?  ? ? ? ?DVT prophylaxis: SCDs Start: 05/11/21 2009 ? ? ?Code Status:   Code Status: Full Code ? ?Family Communication: patient ?Disposition:  ? ?Status is: Inpatient ? ?Dispo: The patient is from: home ?             Anticipated d/c is to: TBD ?             Anticipated d/c date is: TBD ? ?Antimicrobials:   ?Anti-infectives (From admission, onward)  ? ? None  ? ?  ? ? ? ? ?Subjective: ? ?He is in bed, denies new complaints, refused to be examined ?RN reports diarrhea has much improved  ?But he refused to get up or walk with physical therapy ? ? ? ?Objective: ?Vitals:  ? 05/20/21 0356 05/20/21 0841 05/20/21 1253 05/20/21 1627  ?BP: 94/72 123/90 (!) 113/93 111/83  ?Pulse: 62 92 (!) 108 77  ?Resp:  17 16 19   ?Temp: 98.2 ?F (36.8 ?C) 97.7 ?F (36.5 ?C) 98.3 ?F (36.8 ?C) 97.9 ?F (36.6 ?C)  ?TempSrc: Oral Oral Oral Oral  ?SpO2: 100% 100% 100% 100%  ?Weight:      ?Height:      ? ? ?Intake/Output Summary (Last 24 hours) at 05/20/2021 1955 ?Last data filed at 05/20/2021 1815 ?Gross per 24 hour  ?Intake 240 ml  ?Output 675 ml  ?Net -435 ml  ? ?Filed Weights  ? 05/13/21 1600  ?Weight: 65 kg  ? ? ?Examination: ? ?General exam: thin , malnourished,  ?Respiratory system: Respiratory effort normal. ?Psychiatry: no agitation, flat, refused to be examined  ? ? ? ?Data Reviewed: I have personally reviewed   labs and visualized  imaging studies since the last encounter and formulate the plan  ? ? ? ? ? ? ?Scheduled Meds: ? aspirin  81 mg Oral Daily  ? cholestyramine  4 g Oral BID  ? feeding supplement  237 mL Oral TID BM  ? multivitamin with minerals  1 tablet Oral Daily  ? pantoprazole  40 mg Oral Daily  ? vitamin A  10,000 Units Oral Daily  ? Vitamin D (Ergocalciferol)  50,000 Units Oral Q7 days  ? zinc sulfate  220 mg Oral BID  ? ?Continuous Infusions: ? ? LOS: 8 days  ? ? ? ? ?Florencia Reasons, MD PhD FACP ?[facility] ? ?Available via Epic secure chat 7am-7pm for nonurgent issues ?Please page for urgent issues ?To page the attending provider between 7A-7P or the covering provider during after hours 7P-7A, please log into the web site [email or web address] and access using universal [place] password for  that web site. If you do not have the password, please call the hospital operator. ? ? ? ?05/20/2021, 7:55 PM  ? ? ?

## 2021-05-20 NOTE — Progress Notes (Signed)
Physical Therapy Treatment ?Patient Details ?Name: Logan Nelson ?MRN: [id number] ?DOB: Jun 13, 1965 ?Today's Date: 05/20/2021 ? ? ?History of Present Illness PT is a 69 admitted 4/3 with quickly progressing B LE weakness.  Recently hospitalize with incarcerated abdominal hernia, was repaired, but pt left AMA before he was safe to mobilize,  MRI of brain, cervical/thoracic spine inconclusive.  Work up continues.  PMHx: anemia, arthritis, dyspnea, GERD, HTN, LE edema ? ?  ?PT Comments  ? ? Pt laying very close to EOB in sidelying towards footboard upon PT arrival to room, Pt requiring mod assist to sit up and reposition towards HOB. Pt declines all further mobility, stating he does not feel well. PT educated pt on importance of daily mobility and provided max encouragement to mobilize OOB, pt declines. Pt tolerated repositioning and low-level LE exercises in bed well. Will continue to follow, PT encouraged pt to mobilize OOB with RN staff later today when he "feels better".  ? ?   ?Recommendations for follow up therapy are one component of a multi-disciplinary discharge planning process, led by the attending physician.  Recommendations may be updated based on patient status, additional functional criteria and insurance authorization. ? ?Follow Up Recommendations ? Skilled nursing-short term rehab (<3 hours/day) ?  ?  ?Assistance Recommended at Discharge Intermittent Supervision/Assistance  ?Patient can return home with the following Assistance with cooking/housework;Direct supervision/assist for medications management;Direct supervision/assist for financial management;Assist for transportation;Help with stairs or ramp for entrance;A little help with walking and/or transfers;A little help with bathing/dressing/bathroom ?  ?Equipment Recommendations ? Rolling walker (2 wheels)  ?  ?Recommendations for Other Services   ? ? ?  ?Precautions / Restrictions Precautions ?Precautions: Fall ?Restrictions ?Weight Bearing  Restrictions: No  ?  ? ?Mobility ? Bed Mobility ?Overal bed mobility: Needs Assistance ?Bed Mobility: Rolling, Sidelying to Sit, Sit to Sidelying ?Rolling: Min assist ?Sidelying to sit: Mod assist ?  ?  ?Sit to sidelying: Mod assist ?General bed mobility comments: pt very close to EOB in L sidelying upon arrival, PT assisting sidelying to sit then sit to sidelying towards R towards [facility] with assist for LE and truncal management, roll to semi-sidelying for pt comfort. ?  ? ?Transfers ?  ?  ?  ?  ?  ?  ?  ?  ?  ?General transfer comment: pt declines attempt ?  ? ?Ambulation/Gait ?  ?  ?  ?  ?  ?  ?  ?  ? ? ?Stairs ?  ?  ?  ?  ?  ? ? ?Wheelchair Mobility ?  ? ?Modified Rankin (Stroke Patients Only) ?  ? ? ?  ?Balance Overall balance assessment: Needs assistance ?Sitting-balance support: Feet supported, Bilateral upper extremity supported ?Sitting balance-Leahy Scale: Fair ?  ?  ?  ?  ?Standing balance comment: NT today, pt unwilling ?  ?  ?  ?  ?  ?  ?  ?  ?  ?  ?  ?  ? ?  ?Cognition Arousal/Alertness: Awake/alert ?Behavior During Therapy: [facility] for tasks assessed/performed ?Overall Cognitive Status: Impaired/Different from baseline ?Area of Impairment: Problem solving, Safety/judgement, Awareness ?  ?  ?  ?  ?  ?  ?  ?  ?  ?  ?  ?Following Commands: Follows one step commands with increased time ?Safety/Judgement: Decreased awareness of safety, Decreased awareness of deficits ?Awareness: Intellectual ?Problem Solving: Slow processing, Decreased initiation, Difficulty sequencing ?General Comments: pt lacks insight into deficits, states he is in too much pain  although cannot localize it to mobilize. Then states "I almost fell 3 times last night" not understanding the correlation between increasing weakness and difficulty mobilizing. ?  ?  ? ?  ?Exercises General Exercises - Lower Extremity ?Ankle Circles/Pumps: AAROM, Both, 20 reps, Sidelying ?Heel Slides: AAROM, Both, 10 reps, Supine ? ?  ?General Comments   ?  ?   ? ?Pertinent Vitals/Pain Pain Assessment ?Pain Assessment: Faces ?Faces Pain Scale: Hurts little more ?Pain Location: generalized ?Pain Descriptors / Indicators: Grimacing ?Pain Intervention(s): Limited activity within patient's tolerance, Monitored during session, Repositioned  ? ? ?Home Living   ?  ?  ?  ?  ?  ?  ?  ?  ?  ?   ?  ?Prior Function    ?  ?  ?   ? ?PT Goals (current goals can now be found in the care plan section) Acute Rehab PT Goals ?Patient Stated Goal: return to independence ?PT Goal Formulation: With patient ?Time For Goal Achievement: 05/26/21 ?Potential to Achieve Goals: Good ?Progress towards PT goals: Not progressing toward goals - comment (pt self-limiting due to pain) ? ?  ?Frequency ? ? ? Min 3X/week ? ? ? ?  ?PT Plan Current plan remains appropriate  ? ? ?Co-evaluation   ?  ?  ?  ?  ? ?  ?AM-PAC PT "6 Clicks" Mobility   ?Outcome Measure ? Help needed turning from your back to your side while in a flat bed without using bedrails?: A Little ?Help needed moving from lying on your back to sitting on the side of a flat bed without using bedrails?: A Lot ?Help needed moving to and from a bed to a chair (including a wheelchair)?: A Lot ?Help needed standing up from a chair using your arms (e.g., wheelchair or bedside chair)?: A Lot ?Help needed to walk in hospital room?: Total ?Help needed climbing 3-5 steps with a railing? : Total ?6 Click Score: 11 ? ?  ?End of Session   ?Activity Tolerance: Patient limited by pain;Patient limited by fatigue ?Patient left: in bed;with call bell/phone within reach;with bed alarm set ?Nurse Communication: Mobility status ?PT Visit Diagnosis: Other abnormalities of gait and mobility (R26.89);Muscle weakness (generalized) (M62.81);History of falling (Z91.81) ?Pain - part of body: Leg;Ankle and joints of foot ?  ? ? ?Time: 1005-1015 ?PT Time Calculation (min) (ACUTE ONLY): 10 min ? ?Charges:  $Therapeutic Activity: 8-22 mins          ?          ? ?Stacie Glaze, PT  DPT ?Acute Rehabilitation Services ?Pager [phone]  ?Office [phone] ? ? ? ?Jashanti Clinkscale E Stroup ?05/20/2021, 10:30 AM ? ?

## 2021-05-21 NOTE — Progress Notes (Signed)
?PROGRESS NOTE ? ? ? ?Logan Nelson  [id number] DOB: [birth year] DOA: 05/10/2021 ?PCP: [name], MD  ? ? ? ?Brief Narrative:  ?Logan Nelson is a 56 y.o. male with past medical history of severe protein calorie malnutrition, hypertension, anasarca, abdominal wall hernia presented to the hospital with bilateral lower extremity weakness.  Patient was recently hospitalized for abdominal hernia with incarceration and had a open primary repair but then had left AMA before he was placed in rehab.  Patient was admitted this time with ambulatory dysfunction and bilateral lower extremity weakness. ? ?Assessment and plan ? ?Principal Problem: ?  Lower extremity weakness ?Active Problems: ?  Hypertension ?  Protein-calorie malnutrition, severe ?  Hypokalemia ?  Anemia of chronic disease ?  GERD (gastroesophageal reflux disease) ?  Generalized weakness ?  Hypophosphatemia ?  Hypomagnesemia ?  Vitamin D deficiency ?  Pressure injury of buttock, stage 2 ([place]) ?  ? ?BLE weakness/RUE weakness/ambulatory dysfunction, deconditioning ?Thought to be secondary to deconditioning and severe malnutrition.  Seen by neurology.  There was some enhancement on the MRI scan of the spine.  Neurosurgery was consulted and this looks like nonspecific finding .  No signs of infection.  Vitamin B1 level at 127.  Vitamin B12 at 573.   TSH at 2.9.  Vitamin D level very low at 19.7.  We will continue with high-dose of vitamin D supplementation q. weekly for now. Patient has been reassessed by physical therapy again on 05/18/2021 and recommend skilled nursing facility placement.   ? ?Severe protein-calorie malnutrition with mild hyperglycemia. ?Present on admission.  Continue supplementation.  Albumin at 1.8.  Will encourage oral nutrition.  Continue Ensure supplements. ?  ?Anasarca ? secondary to severe malnutrition and low albumin.  Encouraged oral intake. ?  ?S/p open primary repair of chronically incarcerated ventral incisional  hernia, primary right inguinal hernia repair without mesh, extensive lysis of adhesions on 04/30/2021. ? Continue dressing  ? ?Diarrhea. ?Still continues to have a watery diarrhea.  Negative C. difficile screen.  GI pathogen panel was negative.    On as needed Lomotil and loperamide but not improved.  We will try cholestyramine today.  Could be bile acid associated diarrhea.  He states that his diarrhea started after cholecystectomy.   ? ?HTN ?Borderline low.  Not on any medication.  Continue to monitor. ? ?Anemia of chronic disease ?Latest hemoglobin of 10.4.  We will continue to monitor. ?    ?GERD ?On Protonix. ?  ?Hypokalemia ?Received aggressive repletion during hospitalization.  Potassium of 3.1 again today.  Continue to replenish orally.  Check BMP in a.m. ? ?Hypophosphatemia.  Replenished during hospitalization.  Phosphorus of 3.4 today. ? ?Vitamin D deficiency .   Vitamin D level at 19.79.  Continue 50,000 unit vitamin D q. weekly while in the hospital  ? ?Hypomagnesemia.  Has been getting replacement through IV and oral.  Has improved at this time.  Latest magnesium of 1.9. ? ? ? ? ?Nutritional Assessment: ?The patient?s BMI is: Body mass index is 23.13 kg/m?Marland KitchenMarland Kitchen ?Seen by dietician.  I agree with the assessment and plan as outlined below: ?Nutrition Status: ?Nutrition Problem: Severe Malnutrition ?Etiology: chronic illness ?Signs/Symptoms: severe fat depletion, severe muscle depletion, percent weight loss (17.9% weight loss in less than 7 months) ?Percent weight loss: 17.9 % ?Interventions: MVI, Boost Breeze, Refer to RD note for recommendations, Other (Comment), Magic cup (vitamin D, double protein) ? ?. ? ? ?Skin Assessment: ?I have examined the patient?s skin and  I agree with the wound assessment as performed by the wound care RN as outlined below: ?Pressure Injury 05/04/21 Buttocks Left Stage 2 -  Partial thickness loss of dermis presenting as a shallow open injury with a red, pink wound bed without  slough. small dime sized skin tear (Active)  ?05/04/21 0800  ?Location: Buttocks  ?Location Orientation: Left  ?Staging: Stage 2 -  Partial thickness loss of dermis presenting as a shallow open injury with a red, pink wound bed without slough.  ?Wound Description (Comments): small dime sized skin tear  ?Present on Admission: No  ?Dressing Type Other (Comment);None (pt refuses) 05/21/21 0230  ? ? ? ?I have Reviewed nursing notes, Vitals, pain scores, I/o's, Lab results and  imaging results since pt's last encounter, details please see discussion above  ?I ordered the following labs:  ?Unresulted Labs (From admission, onward)  ? ? None  ? ?  ? ? ? ?DVT prophylaxis: Place TED hose Start: 05/21/21 1624 ?SCDs Start: 05/11/21 2009 ? ? ?Code Status:   Code Status: Full Code ? ?Family Communication: patient ?Disposition:  ? ?Status is: Inpatient ? ?Dispo: The patient is from: home ?             Anticipated d/c is to: TBD ?             Anticipated d/c date is: TBD ? ?Antimicrobials:   ?Anti-infectives (From admission, onward)  ? ? None  ? ?  ? ? ? ? ?Subjective: ? ?He is in a good spirit today, he is up and sitting on the edge of the bed, more interactive,  ?He denies pain, reports ate a good breakfast, he reports feeling weak and agreed to work with PT today ?reports diarrhea has much improved  ? ? ? ? ?Objective: ?Vitals:  ? 05/21/21 0252 05/21/21 0715 05/21/21 1107 05/21/21 1500  ?BP: 104/81 (!) 119/96 (!) 121/96 108/86  ?Pulse:  99 78 72  ?Resp: 17 20 14 16   ?Temp: 98.4 ?F (36.9 ?C) 97.6 ?F (36.4 ?C) 97.7 ?F (36.5 ?C) 97.9 ?F (36.6 ?C)  ?TempSrc: Oral Oral Oral Oral  ?SpO2: 97% 100% 100% 100%  ?Weight:      ?Height:      ? ? ?Intake/Output Summary (Last 24 hours) at 05/21/2021 1624 ?Last data filed at 05/21/2021 0800 ?Gross per 24 hour  ?Intake 320 ml  ?Output 325 ml  ?Net -5 ml  ? ?Filed Weights  ? 05/13/21 1600  ?Weight: 65 kg  ? ? ?Examination: ? ?General exam: thin , malnourished,  ?Respiratory system: Respiratory  effort normal. ?Psychiatry: no agitation, flat, refused to be examined  ? ? ? ?Data Reviewed: I have personally reviewed  labs and visualized  imaging studies since the last encounter and formulate the plan  ? ? ? ? ? ? ?Scheduled Meds: ? aspirin  81 mg Oral Daily  ? cholestyramine  4 g Oral BID  ? feeding supplement  237 mL Oral TID BM  ? multivitamin with minerals  1 tablet Oral Daily  ? pantoprazole  40 mg Oral Daily  ? vitamin A  10,000 Units Oral Daily  ? Vitamin D (Ergocalciferol)  50,000 Units Oral Q7 days  ? zinc sulfate  220 mg Oral BID  ? ?Continuous Infusions: ? ? LOS: 9 days  ? ? ? ? ?Florencia Reasons, MD PhD FACP ?[facility] ? ?Available via Epic secure chat 7am-7pm for nonurgent issues ?Please page for urgent issues ?To page the attending provider between 7A-7P  or the covering provider during after hours 7P-7A, please log into the web site [email or web address] and access using universal [place] password for that web site. If you do not have the password, please call the hospital operator. ? ? ? ?05/21/2021, 4:24 PM  ? ? ?

## 2021-05-22 MED ORDER — NYSTATIN 100000 UNIT/ML MT SUSP
5.0000 mL | Freq: Four times a day (QID) | OROMUCOSAL | Status: DC
  Administered 2021-05-22 – 2021-06-10 (×50): 500000 [IU] via ORAL
  Filled 2021-05-22 (×54): qty 5

## 2021-05-22 MED ORDER — KETOROLAC TROMETHAMINE 30 MG/ML IJ SOLN
30.0000 mg | Freq: Once | INTRAMUSCULAR | Status: AC
  Administered 2021-05-22: 30 mg via INTRAVENOUS
  Filled 2021-05-22: qty 1

## 2021-05-22 NOTE — Progress Notes (Signed)
?PROGRESS NOTE ? ? ? ?[name]  [id number] DOB: [birth year] DOA: 05/10/2021 ?PCP: [name], MD  ? ? ? ?Brief Narrative:  ?Logan Nelson is a 56 y.o. male with past medical history of severe protein calorie malnutrition, hypertension, anasarca, abdominal wall hernia presented to the hospital with bilateral lower extremity weakness.  Patient was recently hospitalized for abdominal hernia with incarceration and had a open primary repair but then had left AMA before he was placed in rehab.  Patient was admitted this time with ambulatory dysfunction and bilateral lower extremity weakness. ? ?Assessment and plan ? ?Principal Problem: ?  Lower extremity weakness ?Active Problems: ?  Hypertension ?  Protein-calorie malnutrition, severe ?  Hypokalemia ?  Anemia of chronic disease ?  GERD (gastroesophageal reflux disease) ?  Generalized weakness ?  Hypophosphatemia ?  Hypomagnesemia ?  Vitamin D deficiency ?  Pressure injury of buttock, stage 2 ([place]) ?  ? ?BLE weakness/RUE weakness/ambulatory dysfunction, deconditioning ?Thought to be secondary to deconditioning and severe malnutrition.  Seen by neurology.  There was some enhancement on the MRI scan of the spine.  Neurosurgery was consulted and this looks like nonspecific finding .  No signs of infection.  Vitamin B1 level at 127.  Vitamin B12 at 573.   TSH at 2.9.  Vitamin D level very low at 19.7.  We will continue with high-dose of vitamin D supplementation q. weekly for now. Patient has been reassessed by physical therapy again on 05/18/2021 and recommend skilled nursing facility placement.   ? ?Severe protein-calorie malnutrition with mild hyperglycemia. ?Present on admission.  Continue supplementation.  Albumin at 1.8.  Will encourage oral nutrition.  Continue Ensure supplements. ?  ?Anasarca ? secondary to severe malnutrition and low albumin.  Encouraged oral intake. ?  ?S/p open primary repair of chronically incarcerated ventral incisional  hernia, primary right inguinal hernia repair without mesh, extensive lysis of adhesions on 04/30/2021. ? Continue dressing  ? ?Diarrhea. ?Still continues to have a watery diarrhea.  Negative C. difficile screen.  GI pathogen panel was negative.    On as needed Lomotil and loperamide but not improved.  We will try cholestyramine today.  Could be bile acid associated diarrhea.  He states that his diarrhea started after cholecystectomy.   ? ?HTN ?Borderline low.  Not on any medication.  Continue to monitor. ? ?Anemia of chronic disease ?Latest hemoglobin of 10.4.  We will continue to monitor. ?    ?GERD ?On Protonix. ?  ?Hypokalemia ?Received aggressive repletion during hospitalization.  Potassium of 3.1 again today.  Continue to replenish orally.  Check BMP in a.m. ? ?Hypophosphatemia.  Replenished during hospitalization.  Phosphorus of 3.4 today. ? ?Vitamin D deficiency .   Vitamin D level at 19.79.  Continue 50,000 unit vitamin D q. weekly while in the hospital  ? ?Hypomagnesemia.  Has been getting replacement through IV and oral.  Has improved at this time.  Latest magnesium of 1.9. ? ? ? ? ?Nutritional Assessment: ?The patient?s BMI is: Body mass index is 23.13 kg/m?Marland KitchenMarland Kitchen ?Seen by dietician.  I agree with the assessment and plan as outlined below: ?Nutrition Status: ?Nutrition Problem: Severe Malnutrition ?Etiology: chronic illness ?Signs/Symptoms: severe fat depletion, severe muscle depletion, percent weight loss (17.9% weight loss in less than 7 months) ?Percent weight loss: 17.9 % ?Interventions: MVI, Boost Breeze, Refer to RD note for recommendations, Other (Comment), Magic cup (vitamin D, double protein) ? ?. ? ? ?Skin Assessment: ?I have examined the patient?s skin and  I agree with the wound assessment as performed by the wound care RN as outlined below: ?Pressure Injury 05/04/21 Buttocks Left Stage 2 -  Partial thickness loss of dermis presenting as a shallow open injury with a red, pink wound bed without  slough. small dime sized skin tear (Active)  ?05/04/21 0800  ?Location: Buttocks  ?Location Orientation: Left  ?Staging: Stage 2 -  Partial thickness loss of dermis presenting as a shallow open injury with a red, pink wound bed without slough.  ?Wound Description (Comments): small dime sized skin tear  ?Present on Admission: No  ?Dressing Type Other (Comment);None (pt refuses) 05/21/21 0230  ? ? ? ?I have Reviewed nursing notes, Vitals, pain scores, I/o's, Lab results and  imaging results since pt's last encounter, details please see discussion above  ?I ordered the following labs:  ?Unresulted Labs (From admission, onward)  ? ? None  ? ?  ? ? ? ?DVT prophylaxis: Place TED hose Start: 05/21/21 1624 ?SCDs Start: 05/11/21 2009 ? ? ?Code Status:   Code Status: Full Code ? ?Family Communication: patient ?Disposition:  ? ?Status is: Inpatient ? ?Dispo: The patient is from: home ?             Anticipated d/c is to: TBD ?             Anticipated d/c date is: TBD ? ?Antimicrobials:   ?Anti-infectives (From admission, onward)  ? ? None  ? ?  ? ? ? ? ?Subjective: ? ?Reports had a good morning, just returned back to bed ?  ?He denies pain, he reports feeling weak and agreed to work with PT  ?reports diarrhea has much improved /stopped ? ? ? ? ?Objective: ?Vitals:  ? 05/22/21 0335 05/22/21 0843 05/22/21 1101 05/22/21 1515  ?BP: 102/80 102/71 100/64 114/78  ?Pulse: 90 73 70 70  ?Resp: 18 20 20 18   ?Temp: 98.2 ?F (36.8 ?C) 97.8 ?F (36.6 ?C) 97.9 ?F (36.6 ?C) 98.3 ?F (36.8 ?C)  ?TempSrc: Oral Oral Oral Oral  ?SpO2: 100% 100% 100% (!) 66%  ?Weight:      ?Height:      ? ? ?Intake/Output Summary (Last 24 hours) at 05/22/2021 1606 ?Last data filed at 05/22/2021 0800 ?Gross per 24 hour  ?Intake 660 ml  ?Output 0 ml  ?Net 660 ml  ? ?Filed Weights  ? 05/13/21 1600  ?Weight: 65 kg  ? ? ?Examination: ? ?General exam: thin , malnourished,  ?Respiratory system: Respiratory effort normal. ?Psychiatry: no agitation, flat, refused to be examined   ? ? ? ?Data Reviewed: I have personally reviewed  labs and visualized  imaging studies since the last encounter and formulate the plan  ? ? ? ? ? ? ?Scheduled Meds: ? aspirin  81 mg Oral Daily  ? cholestyramine  4 g Oral BID  ? feeding supplement  237 mL Oral TID BM  ? multivitamin with minerals  1 tablet Oral Daily  ? nystatin  5 mL Oral QID  ? pantoprazole  40 mg Oral Daily  ? vitamin A  10,000 Units Oral Daily  ? Vitamin D (Ergocalciferol)  50,000 Units Oral Q7 days  ? zinc sulfate  220 mg Oral BID  ? ?Continuous Infusions: ? ? LOS: 10 days  ? ? ? ? ?Florencia Reasons, MD PhD FACP ?[facility] ? ?Available via Epic secure chat 7am-7pm for nonurgent issues ?Please page for urgent issues ?To page the attending provider between 7A-7P or the covering provider during after hours  7P-7A, please log into the web site [email or web address] and access using universal [place] password for that web site. If you do not have the password, please call the hospital operator. ? ? ? ?05/22/2021, 4:06 PM  ? ? ?

## 2021-05-23 LAB — BASIC METABOLIC PANEL
Anion gap: 4 — ABNORMAL LOW (ref 5–15)
BUN: 21 mg/dL — ABNORMAL HIGH (ref 6–20)
CO2: 21 mmol/L — ABNORMAL LOW (ref 22–32)
Calcium: 7.3 mg/dL — ABNORMAL LOW (ref 8.9–10.3)
Chloride: 111 mmol/L (ref 98–111)
Creatinine, Ser: 0.69 mg/dL (ref 0.61–1.24)
GFR, Estimated: >60 mL/min (ref >60)
Glucose, Bld: 87 mg/dL (ref 70–99)
Potassium: 4.2 mmol/L (ref 3.5–5.1)
Sodium: 136 mmol/L (ref 135–145)

## 2021-05-23 LAB — CBC
HCT: 28.4 % — ABNORMAL LOW (ref 39.0–52.0)
Hemoglobin: 9.3 g/dL — ABNORMAL LOW (ref 13.0–17.0)
MCH: 30.1 pg (ref 26.0–34.0)
MCHC: 32.7 g/dL (ref 30.0–36.0)
MCV: 91.9 fL (ref 80.0–100.0)
Platelets: 263 10*3/uL (ref 150–400)
RBC: 3.09 MIL/uL — ABNORMAL LOW (ref 4.22–5.81)
RDW: 15.7 % — ABNORMAL HIGH (ref 11.5–15.5)
WBC: 13.7 10*3/uL — ABNORMAL HIGH (ref 4.0–10.5)
nRBC: 0 % (ref 0.0–0.2)

## 2021-05-23 LAB — PHOSPHORUS: Phosphorus: 2.3 mg/dL — ABNORMAL LOW (ref 2.5–4.6)

## 2021-05-23 LAB — MAGNESIUM: Magnesium: 1.5 mg/dL — ABNORMAL LOW (ref 1.7–2.4)

## 2021-05-23 MED ORDER — K PHOS MONO-SOD PHOS DI & MONO 155-852-130 MG PO TABS
250.0000 mg | ORAL_TABLET | Freq: Two times a day (BID) | ORAL | Status: AC
  Administered 2021-05-23 – 2021-05-24 (×3): 250 mg via ORAL
  Filled 2021-05-23 (×3): qty 1

## 2021-05-23 MED ORDER — KETOROLAC TROMETHAMINE 30 MG/ML IJ SOLN
30.0000 mg | Freq: Once | INTRAMUSCULAR | Status: AC
  Administered 2021-05-23: 30 mg via INTRAVENOUS
  Filled 2021-05-23: qty 1

## 2021-05-23 MED ORDER — MAGNESIUM SULFATE 2 GM/50ML IV SOLN
2.0000 g | Freq: Once | INTRAVENOUS | Status: AC
Start: 2021-05-23 — End: 2021-05-23
  Administered 2021-05-23: 2 g via INTRAVENOUS
  Filled 2021-05-23: qty 50

## 2021-05-23 NOTE — Progress Notes (Signed)
?PROGRESS NOTE ? ? ? ?Daemond Clearwater  [id number] DOB: [birth year] DOA: 05/10/2021 ?PCP: [name], MD  ? ? ? ?Brief Narrative:  ?Aireon Jalali is a 56 y.o. male with past medical history of severe protein calorie malnutrition, hypertension, anasarca, abdominal wall hernia presented to the hospital with bilateral lower extremity weakness.  Patient was recently hospitalized for abdominal hernia with incarceration and had a open primary repair but then had left AMA before he was placed in rehab.  Patient was admitted this time with ambulatory dysfunction and bilateral lower extremity weakness. ? ?Assessment and plan ? ?Principal Problem: ?  Lower extremity weakness ?Active Problems: ?  Hypertension ?  Protein-calorie malnutrition, severe ?  Hypokalemia ?  Anemia of chronic disease ?  GERD (gastroesophageal reflux disease) ?  Generalized weakness ?  Hypophosphatemia ?  Hypomagnesemia ?  Vitamin D deficiency ?  Pressure injury of buttock, stage 2 ([place]) ?  ? ?BLE weakness/RUE weakness/ambulatory dysfunction, deconditioning ?Thought to be secondary to deconditioning and severe malnutrition.  Seen by neurology.  There was some enhancement on the MRI scan of the spine.  Neurosurgery was consulted and this looks like nonspecific finding .  No signs of infection.  Vitamin B1 level at 127.  Vitamin B12 at 573.   TSH at 2.9.  Vitamin D level very low at 19.7.  We will continue with high-dose of vitamin D supplementation q. weekly for now. Patient has been reassessed by physical therapy again on 05/18/2021 and recommend skilled nursing facility placement.   ? ?Severe protein-calorie malnutrition with mild hyperglycemia. ?Present on admission.  Continue supplementation.  Albumin at 1.8.  Will encourage oral nutrition.  Continue Ensure supplements. ?  ?Anasarca ? secondary to severe malnutrition and low albumin.  Encouraged oral intake. ?  ?S/p open primary repair of chronically incarcerated ventral incisional  hernia, primary right inguinal hernia repair without mesh, extensive lysis of adhesions on 04/30/2021. ? Continue dressing  ? ?Diarrhea. ?Still continues to have a watery diarrhea.  Negative C. difficile screen.  GI pathogen panel was negative.    On as needed Lomotil and loperamide but not improved.  We will try cholestyramine today.  Could be bile acid associated diarrhea.  He states that his diarrhea started after cholecystectomy.   ? ?HTN ?Borderline low.  Not on any medication.  Continue to monitor. ? ?Anemia of chronic disease ?Latest hemoglobin of 10.4.  We will continue to monitor. ?    ?GERD ?On Protonix. ?  ?Hypokalemia ?Received aggressive repletion during hospitalization.  Potassium of 3.1 again today.  Continue to replenish orally.  Check BMP in a.m. ? ?Hypophosphatemia.  Replenished during hospitalization.  Phosphorus of 3.4 today. ? ?Vitamin D deficiency .   Vitamin D level at 19.79.  Continue 50,000 unit vitamin D q. weekly while in the hospital  ? ?Hypomagnesemia.  Has been getting replacement through IV and oral.  Has improved at this time.  Latest magnesium of 1.9. ? ? ? ? ?Nutritional Assessment: ?The patient?s BMI is: Body mass index is 23.13 kg/m?Marland KitchenMarland Kitchen ?Seen by dietician.  I agree with the assessment and plan as outlined below: ?Nutrition Status: ?Nutrition Problem: Severe Malnutrition ?Etiology: chronic illness ?Signs/Symptoms: severe fat depletion, severe muscle depletion, percent weight loss (17.9% weight loss in less than 7 months) ?Percent weight loss: 17.9 % ?Interventions: MVI, Boost Breeze, Refer to RD note for recommendations, Other (Comment), Magic cup (vitamin D, double protein) ? ?. ? ? ?Skin Assessment: ?I have examined the patient?s skin and  I agree with the wound assessment as performed by the wound care RN as outlined below: ?Pressure Injury 05/04/21 Buttocks Left Stage 2 -  Partial thickness loss of dermis presenting as a shallow open injury with a red, pink wound bed without  slough. small dime sized skin tear (Active)  ?05/04/21 0800  ?Location: Buttocks  ?Location Orientation: Left  ?Staging: Stage 2 -  Partial thickness loss of dermis presenting as a shallow open injury with a red, pink wound bed without slough.  ?Wound Description (Comments): small dime sized skin tear  ?Present on Admission: No  ?Dressing Type Foam - Lift dressing to assess site every shift 05/23/21 1037  ? ? ? ?I have Reviewed nursing notes, Vitals, pain scores, I/o's, Lab results and  imaging results since pt's last encounter, details please see discussion above  ?I ordered the following labs:  ?Unresulted Labs (From admission, onward)  ? ?  Start     Ordered  ? 05/24/21 0500  CBC  Every Monday (0500),   R (with TIMED occurrences)     ?Question:  Specimen collection method  Answer:  Lab=Lab collect  ? 05/23/21 1940  ? 05/24/21 [id number]  Basic metabolic panel  Every Monday (0500),   R (with TIMED occurrences)     ?Question:  Specimen collection method  Answer:  Lab=Lab collect  ? 05/23/21 1940  ? 05/24/21 0500  Magnesium  Every Monday (0500),   R (with TIMED occurrences)     ?Question:  Specimen collection method  Answer:  Lab=Lab collect  ? 05/23/21 1940  ? 05/24/21 0500  Phosphorus  Every Monday (0500),   R (with TIMED occurrences)     ?Question:  Specimen collection method  Answer:  Lab=Lab collect  ? 05/23/21 1940  ? ?  ?  ? ?  ? ? ? ?DVT prophylaxis: Place TED hose Start: 05/21/21 1624 ?SCDs Start: 05/11/21 2009 ? ? ?Code Status:   Code Status: Full Code ? ?Family Communication: patient ?Disposition:  ? ?Status is: Inpatient ? ?Dispo: The patient is from: home ?             Anticipated d/c is to: TBD ?             Anticipated d/c date is: TBD ? ?Antimicrobials:   ?Anti-infectives (From admission, onward)  ? ? None  ? ?  ? ? ? ? ?Subjective: ? ?No acute interval changes, report ate 100% of the breakfast this morning ?Denies pain ?Report diarrhea has resolved ?  ?He denies pain, he reports feeling weak  ?Legs remain  edematous  ? ? ? ? ? ?Objective: ?Vitals:  ? 05/23/21 0813 05/23/21 1141 05/23/21 1603 05/23/21 2033  ?BP: 100/68 119/89 118/81 123/86  ?Pulse: 100 71 95 88  ?Resp: 16 17 16 17   ?Temp: 98.4 ?F (36.9 ?C) 98.1 ?F (36.7 ?C) 98.2 ?F (36.8 ?C) 99.6 ?F (37.6 ?C)  ?TempSrc: Oral Oral Oral Oral  ?SpO2: 100% 100% 100%   ?Weight:      ?Height:      ? ? ?Intake/Output Summary (Last 24 hours) at 05/23/2021 2238 ?Last data filed at 05/23/2021 1828 ?Gross per 24 hour  ?Intake --  ?Output 600 ml  ?Net -600 ml  ? ?Filed Weights  ? 05/13/21 1600  ?Weight: 65 kg  ? ? ?Examination: ? ?General exam: thin , malnourished,  ?Respiratory system: Respiratory effort normal. ?Bilateral lower extremity  edema ?Psychiatry: no agitation, flat, refused to be examined  ? ? ? ?Data Reviewed:  I have personally reviewed  labs and visualized  imaging studies since the last encounter and formulate the plan  ? ? ? ? ? ? ?Scheduled Meds: ? aspirin  81 mg Oral Daily  ? cholestyramine  4 g Oral BID  ? feeding supplement  237 mL Oral TID BM  ? multivitamin with minerals  1 tablet Oral Daily  ? nystatin  5 mL Oral QID  ? pantoprazole  40 mg Oral Daily  ? phosphorus  250 mg Oral BID  ? vitamin A  10,000 Units Oral Daily  ? Vitamin D (Ergocalciferol)  50,000 Units Oral Q7 days  ? zinc sulfate  220 mg Oral BID  ? ?Continuous Infusions: ? ? LOS: 11 days  ? ? ? ? ?Florencia Reasons, MD PhD FACP ?[facility] ? ?Available via Epic secure chat 7am-7pm for nonurgent issues ?Please page for urgent issues ?To page the attending provider between 7A-7P or the covering provider during after hours 7P-7A, please log into the web site [email or web address] and access using universal [place] password for that web site. If you do not have the password, please call the hospital operator. ? ? ? ?05/23/2021, 10:38 PM  ? ? ?

## 2021-05-24 DIAGNOSIS — E559 Vitamin D deficiency, unspecified: Secondary | ICD-10-CM

## 2021-05-24 DIAGNOSIS — L89302 Pressure ulcer of unspecified buttock, stage 2: Secondary | ICD-10-CM

## 2021-05-24 LAB — CBC
HCT: 27.7 % — ABNORMAL LOW (ref 39.0–52.0)
Hemoglobin: 9.2 g/dL — ABNORMAL LOW (ref 13.0–17.0)
MCH: 30.3 pg (ref 26.0–34.0)
MCHC: 33.2 g/dL (ref 30.0–36.0)
MCV: 91.1 fL (ref 80.0–100.0)
Platelets: 248 10*3/uL (ref 150–400)
RBC: 3.04 MIL/uL — ABNORMAL LOW (ref 4.22–5.81)
RDW: 15.7 % — ABNORMAL HIGH (ref 11.5–15.5)
WBC: 13 10*3/uL — ABNORMAL HIGH (ref 4.0–10.5)
nRBC: 0 % (ref 0.0–0.2)

## 2021-05-24 LAB — MAGNESIUM: Magnesium: 1.8 mg/dL (ref 1.7–2.4)

## 2021-05-24 LAB — BASIC METABOLIC PANEL
Anion gap: 4 — ABNORMAL LOW (ref 5–15)
BUN: 20 mg/dL (ref 6–20)
CO2: 20 mmol/L — ABNORMAL LOW (ref 22–32)
Calcium: 7.4 mg/dL — ABNORMAL LOW (ref 8.9–10.3)
Chloride: 113 mmol/L — ABNORMAL HIGH (ref 98–111)
Creatinine, Ser: 0.44 mg/dL — ABNORMAL LOW (ref 0.61–1.24)
GFR, Estimated: >60 mL/min (ref >60)
Glucose, Bld: 80 mg/dL (ref 70–99)
Potassium: 4 mmol/L (ref 3.5–5.1)
Sodium: 137 mmol/L (ref 135–145)

## 2021-05-24 LAB — PHOSPHORUS: Phosphorus: 2.5 mg/dL (ref 2.5–4.6)

## 2021-05-24 MED ORDER — KETOROLAC TROMETHAMINE 15 MG/ML IJ SOLN
15.0000 mg | Freq: Three times a day (TID) | INTRAMUSCULAR | Status: AC | PRN
  Administered 2021-05-24 – 2021-05-29 (×10): 15 mg via INTRAVENOUS
  Filled 2021-05-24 (×10): qty 1

## 2021-05-24 MED ORDER — OXYCODONE-ACETAMINOPHEN 5-325 MG PO TABS
1.0000 | ORAL_TABLET | Freq: Four times a day (QID) | ORAL | Status: DC | PRN
  Administered 2021-05-25 – 2021-05-26 (×2): 1 via ORAL
  Administered 2021-05-27 – 2021-06-05 (×15): 2 via ORAL
  Administered 2021-06-10 – 2021-06-11 (×4): 1 via ORAL
  Administered 2021-06-12 – 2021-06-13 (×4): 2 via ORAL
  Filled 2021-05-24 (×4): qty 2
  Filled 2021-05-24: qty 1
  Filled 2021-05-24 (×3): qty 2
  Filled 2021-05-24: qty 1
  Filled 2021-05-24 (×5): qty 2
  Filled 2021-05-24 (×4): qty 1
  Filled 2021-05-24 (×6): qty 2
  Filled 2021-05-24: qty 1
  Filled 2021-05-24: qty 2

## 2021-05-24 NOTE — Progress Notes (Signed)
Physical Therapy Treatment ?Patient Details ?Name: Logan Nelson ?MRN: [id number] ?DOB: 23-Apr-1965 ?Today's Date: 05/24/2021 ? ? ?History of Present Illness Pt is a 67 admitted 4/3 with quickly progressing B LE weakness.  Recently hospitalize with incarcerated abdominal hernia, was repaired, but pt left AMA before he was safe to mobilize,  MRI of brain, cervical/thoracic spine inconclusive.  Work up continues.  PMHx: anemia, arthritis, dyspnea, GERD, HTN, LE edema ? ?  ?PT Comments  ? ? Pt with improved ambulation tolerance today however remains to have urinary incontinence, bilat pitting LE edema and in R UE limiting ability to use functionally. Pt with decreased activity tolerance and requires assist for all ADLs, transfers, and ambulation at this time. Acute PT to benefit from ST-SNF upon d/c to improve strength and mobility to achieve independence for safe transition home. ?   ?Recommendations for follow up therapy are one component of a multi-disciplinary discharge planning process, led by the attending physician.  Recommendations may be updated based on patient status, additional functional criteria and insurance authorization. ? ?Follow Up Recommendations ? Skilled nursing-short term rehab (<3 hours/day) ?  ?  ?Assistance Recommended at Discharge Frequent or constant Supervision/Assistance  ?Patient can return home with the following Assistance with cooking/housework;Direct supervision/assist for medications management;Direct supervision/assist for financial management;Assist for transportation;Help with stairs or ramp for entrance;A little help with walking and/or transfers ?  ?Equipment Recommendations ? Rolling walker (2 wheels)  ?  ?Recommendations for Other Services Rehab consult ? ? ?  ?Precautions / Restrictions Precautions ?Precautions: Fall ?Precaution Comments: LE weakness, very incontinent ?Restrictions ?Weight Bearing Restrictions: No  ?  ? ?Mobility ? Bed Mobility ?Overal bed mobility: Needs  Assistance ?Bed Mobility: Rolling, Sidelying to Sit, Sit to Sidelying ?Rolling: Min assist ?Sidelying to sit: Mod assist ?  ?  ?  ?General bed mobility comments: max directional verbal cues, limited use of R UE due to weakness and swelling, pt with large abdomen requiring modA for trunk elevation ?  ? ?Transfers ?Overall transfer level: Needs assistance ?Equipment used: Rolling walker (2 wheels) ?Transfers: Sit to/from Stand ?Sit to Stand: Min assist ?  ?  ?  ?  ?  ?General transfer comment: increased time, minA to power up from EOB x2, pt then refused to stand up from chair stating "I can't do it from here" despite having just ambulated 83' ?  ? ?Ambulation/Gait ?Ambulation/Gait assistance: Min assist ?Gait Distance (Feet): 75 Feet ?Assistive device: Rolling walker (2 wheels) ?Gait Pattern/deviations: Decreased stride length, Step-to pattern, Decreased step length - right, Decreased step length - left ?Gait velocity: decreased ?Gait velocity interpretation: <1.31 ft/sec, indicative of household ambulator ?  ?General Gait Details: pt with slow steady gait, dependent on RW due to bialt LE weakness, pt with noted bilat LE edema and R UE edema ? ? ?Stairs ?  ?Stairs assistance: Min guard ?Stair Management: One rail Right, Step to pattern, Forwards, Two rails ?  ?General stair comments: Pt safe ascedning and descending stairs ? ? ?Wheelchair Mobility ?  ? ?Modified Rankin (Stroke Patients Only) ?  ? ? ?  ?Balance Overall balance assessment: Needs assistance ?Sitting-balance support: Feet supported, Bilateral upper extremity supported ?Sitting balance-Leahy Scale: Fair ?Sitting balance - Comments: sitting EOB upon therapist's arrival ?Postural control: Posterior lean ?Standing balance support: Single extremity supported, During functional activity ?Standing balance-Leahy Scale: Poor ?Standing balance comment: dependent on RW ?  ?  ?  ?  ?  ?  ?  ?  ?  ?  ?  ?  ? ?  ?  Cognition Arousal/Alertness: Awake/alert ?Behavior  During Therapy: Anxious ?Overall Cognitive Status: No family/caregiver present to determine baseline cognitive functioning ?  ?  ?  ?  ?  ?  ?  ?  ?  ?  ?  ?  ?  ?  ?  ?  ?General Comments: pt with self limiting tendencies, anxious re: falling, aware he is weak and states "I need a w/c" despite walking 75', pt reports "I have no wear to go because my grandad is 45 and is having surgery ?  ?  ? ?  ?Exercises   ? ?  ?General Comments General comments (skin integrity, edema, etc.): pt with pitting edema in bilat LEs and R UE ?  ?  ? ?Pertinent Vitals/Pain Pain Assessment ?Pain Assessment: Faces ?Faces Pain Scale: Hurts little more ?Pain Location: R UE ?Pain Descriptors / Indicators: Grimacing  ? ? ?Home Living   ?  ?  ?  ?  ?  ?  ?  ?  ?  ?   ?  ?Prior Function    ?  ?  ?   ? ?PT Goals (current goals can now be found in the care plan section) Acute Rehab PT Goals ?PT Goal Formulation: With patient ?Time For Goal Achievement: 05/26/21 ?Potential to Achieve Goals: Good ?Progress towards PT goals: Progressing toward goals ? ?  ?Frequency ? ? ? Min 3X/week ? ? ? ?  ?PT Plan Current plan remains appropriate  ? ? ?Co-evaluation   ?  ?  ?  ?  ? ?  ?AM-PAC PT "6 Clicks" Mobility   ?Outcome Measure ? Help needed turning from your back to your side while in a flat bed without using bedrails?: A Lot ?Help needed moving from lying on your back to sitting on the side of a flat bed without using bedrails?: A Lot ?Help needed moving to and from a bed to a chair (including a wheelchair)?: A Lot ?Help needed standing up from a chair using your arms (e.g., wheelchair or bedside chair)?: A Lot ?Help needed to walk in hospital room?: A Lot ?Help needed climbing 3-5 steps with a railing? : Total ?6 Click Score: 11 ? ?  ?End of Session Equipment Utilized During Treatment: Gait belt ?Activity Tolerance: Patient tolerated treatment well ?Patient left: in chair;with chair alarm set;with call bell/phone within reach ?Nurse Communication:  Mobility status (alerted RN pt needed to use bathroom) ?PT Visit Diagnosis: Other abnormalities of gait and mobility (R26.89);Muscle weakness (generalized) (M62.81);History of falling (Z91.81) ?  ? ? ?Time: [id number] ?PT Time Calculation (min) (ACUTE ONLY): 24 min ? ?Charges:  $Gait Training: 23-37 mins          ?          ? ?Kittie Plater, PT, DPT ?Acute Rehabilitation Services ?Secure chat preferred ?Office #: [phone] ? ? ? ?Aryiah Monterosso M Javanna Patin ?05/24/2021, 2:56 PM ? ?

## 2021-05-24 NOTE — Progress Notes (Signed)
?PROGRESS NOTE ? ? ? ?Logan Nelson  [id number] DOB: 21-Feb-1965 DOA: 05/10/2021 ?PCP: Logan Mai, MD  ? ? ?Brief Narrative:  ?Logan Nelson is a 56 y.o. male with past medical history of severe protein calorie malnutrition, hypertension, anasarca, abdominal wall hernia presented to the hospital with bilateral lower extremity weakness.  Patient was recently hospitalized for abdominal hernia with incarceration and had a open primary repair but then had left AMA before he was placed in rehab.  Patient was admitted this time with ambulatory dysfunction and bilateral lower extremity weakness. ? ?Assessment and plan ? ?Principal Problem: ?  Lower extremity weakness ?Active Problems: ?  Hypertension ?  Protein-calorie malnutrition, severe ?  Hypokalemia ?  Anemia of chronic disease ?  GERD (gastroesophageal reflux disease) ?  Generalized weakness ?  Hypophosphatemia ?  Hypomagnesemia ?  Vitamin D deficiency ?  Pressure injury of buttock, stage 2 ([place]) ?  ? ?BLE weakness/RUE weakness/ambulatory dysfunction, deconditioning ?Thought to be secondary to deconditioning and severe malnutrition.  Seen by neurology.  There was some enhancement on the MRI scan of the spine.  Neurosurgery was consulted and this looks like nonspecific finding .  No signs of infection.  Vitamin B1 level at 127.  Vitamin B12 at 573.   TSH at 2.9.  Vitamin D level very low at 19.7.  Seen by physical therapy with recommendation of SNF placement. ?--Vitamin D 50,000 units every 7 days ?--TOC for SNF placement ? ?Severe protein-calorie malnutrition with mild hyperglycemia. ?Present on admission.  Continue supplementation.  Albumin at 1.8.  Will encourage oral nutrition.  Continue Ensure supplements. ?  ?Anasarca ?Secondary to severe malnutrition and low albumin.  Encouraged oral intake. ?  ?S/p open primary repair of chronically incarcerated ventral incisional hernia, primary right inguinal hernia repair without mesh, extensive lysis of  adhesions on 04/30/2021. ?--Continue dressing changes ? ?Diarrhea. ?Negative C. difficile screen.  GI pathogen panel was negative. Could be bile acid associated diarrhea.  He states that his diarrhea started after cholecystectomy.      ?--Lomotil, loperamide as needed  ?--Cholestyramine 4 g p.o. twice daily  ? ?HTN ?Borderline low.  Not on any medication.  Continue to monitor. ? ?Anemia of chronic disease ?Latest hemoglobin of 10.4.  We will continue to monitor. ?    ?GERD ?On Protonix. ?  ?Hypokalemia ?Repleted during hospitalization.  Potassium 4.0 this morning. ?--Continue monitor electrolytes ? ?Hypophosphatemia.   ?Replenished during hospitalization.  Phosphorus of 2.5 today. ? ?Vitamin D deficiency  ?Vitamin D level at 19.79.   ?--Continue 50,000 unit vitamin D q. weekly  ? ?Hypomagnesemia.   ?Repleted.  Magnesium 1.8 this morning.  ? ? ? ? ?DVT prophylaxis: Place TED hose Start: 05/21/21 1624 ?SCDs Start: 05/11/21 2009 ? ?  Code Status: Full Code ?Family Communication: No family present at bedside ? ?Disposition Plan:  ?Level of care: Telemetry Medical ?Status is: Inpatient ?Remains inpatient appropriate because: Awaiting SNF placement, per TOC ?  ? ?Consultants:  ?None ? ?Procedures:  ?None ? ?Antimicrobials:  ?None ? ? ?Subjective: ?Patient seen examined at bedside, resting comfortably.  No specific complaints this morning.  Awaiting SNF placement, difficult per TOC.  Denies headache, no vision changes, no chest pain, no palpitations, no shortness of breath, no abdominal pain, no focal weakness, no paresthesias, no fever/chills/night sweats, no nausea/vomiting.  No acute events overnight per nurse staff. ? ?Objective: ?Vitals:  ? 05/23/21 2358 05/24/21 0329 05/24/21 0832 05/24/21 1112  ?BP: 104/77 115/81 116/90 122/87  ?  Pulse: 70 63 84 66  ?Resp: 17 17 14 20   ?Temp: 98.2 ?F (36.8 ?C) 98.2 ?F (36.8 ?C) 97.9 ?F (36.6 ?C) 98 ?F (36.7 ?C)  ?TempSrc: Oral Oral Oral Oral  ?SpO2: 100% 100% 100% 100%  ?Weight:       ?Height:      ? ? ?Intake/Output Summary (Last 24 hours) at 05/24/2021 1447 ?Last data filed at 05/24/2021 [id number] ?Gross per 24 hour  ?Intake --  ?Output 900 ml  ?Net -900 ml  ? ?Filed Weights  ? 05/13/21 1600  ?Weight: 65 kg  ? ? ?Examination: ? ?Physical Exam: ?GEN: NAD, alert and oriented, chronically ill/cachectic in appearance, appears older than stated age ?HEENT: NCAT, PERRL, EOMI, sclera clear, MMM ?PULM: CTAB w/o wheezes/crackles, normal respiratory effort, on room air ?CV: RRR w/o M/G/R ?GI: abd soft, NTND, NABS, no R/G/M ?MSK: Bilateral lower extremity edema, moves all extremities independently ?NEURO: CN II-XII intact, no focal deficits, sensation to light touch intact ?PSYCH: Depressed mood/flat affect ? ? ? ?Data Reviewed: I have personally reviewed following labs and imaging studies ? ?CBC: ?Recent Labs  ?Lab 05/19/21 ?[id number] 05/23/21 ?0242 05/24/21 ?[id number]  ?WBC 8.8 13.7* 13.0*  ?HGB 9.8* 9.3* 9.2*  ?HCT 31.2* 28.4* 27.7*  ?MCV 96.0 91.9 91.1  ?PLT 285 263 248  ? ?Basic Metabolic Panel: ?Recent Labs  ?Lab 05/18/21 ?[id number] 05/19/21 ?[id number] 05/20/21 ?0351 05/23/21 ?0242 05/24/21 ?[id number]  ?NA 142 141 138 136 137  ?K 3.1* 3.6 4.2 4.2 4.0  ?CL 112* 114* 116* 111 113*  ?CO2 24 19* 19* 21* 20*  ?GLUCOSE 56* 64* 82 87 80  ?BUN 35* 36* 31* 21* 20  ?CREATININE 0.75 0.83 0.69 0.69 0.44*  ?CALCIUM 7.5* 7.8* 7.8* 7.3* 7.4*  ?MG 1.9 2.1  --  1.5* 1.8  ?PHOS 3.4  --   --  2.3* 2.5  ? ?GFR: ?Estimated Creatinine Clearance: 90.8 mL/min (A) (by C-G formula based on SCr of 0.44 mg/dL (L)). ?Liver Function Tests: ?No results for input(s): AST, ALT, ALKPHOS, BILITOT, PROT, ALBUMIN in the last 168 hours. ?No results for input(s): LIPASE, AMYLASE in the last 168 hours. ?No results for input(s): AMMONIA in the last 168 hours. ?Coagulation Profile: ?No results for input(s): INR, PROTIME in the last 168 hours. ?Cardiac Enzymes: ?No results for input(s): CKTOTAL, CKMB, CKMBINDEX, TROPONINI in the last 168 hours. ?BNP (last 3 results) ?No  results for input(s): PROBNP in the last 8760 hours. ?HbA1C: ?No results for input(s): HGBA1C in the last 72 hours. ?CBG: ?No results for input(s): GLUCAP in the last 168 hours. ?Lipid Profile: ?No results for input(s): CHOL, HDL, LDLCALC, TRIG, CHOLHDL, LDLDIRECT in the last 72 hours. ?Thyroid Function Tests: ?No results for input(s): TSH, T4TOTAL, FREET4, T3FREE, THYROIDAB in the last 72 hours. ?Anemia Panel: ?No results for input(s): VITAMINB12, FOLATE, FERRITIN, TIBC, IRON, RETICCTPCT in the last 72 hours. ?Sepsis Labs: ?No results for input(s): PROCALCITON, LATICACIDVEN in the last 168 hours. ? ?No results found for this or any previous visit (from the past 240 hour(s)).  ? ? ? ? ? ?Radiology Studies: ?No results found. ? ? ? ? ? ?Scheduled Meds: ? aspirin  81 mg Oral Daily  ? cholestyramine  4 g Oral BID  ? feeding supplement  237 mL Oral TID BM  ? multivitamin with minerals  1 tablet Oral Daily  ? nystatin  5 mL Oral QID  ? pantoprazole  40 mg Oral Daily  ? phosphorus  250 mg Oral BID  ? vitamin  A  10,000 Units Oral Daily  ? Vitamin D (Ergocalciferol)  50,000 Units Oral Q7 days  ? zinc sulfate  220 mg Oral BID  ? ?Continuous Infusions: ? ? LOS: 12 days  ? ? ?Time spent: 43 minutes spent on chart review, discussion with nursing staff, consultants, updating family and interview/physical exam; more than 50% of that time was spent in counseling and/or coordination of care. ? ? ? ?Federico Maiorino J [place] DO ?[facility] ?Available via Epic secure chat 7am-7pm ?After these hours, please refer to coverage provider listed on [email or web address] ?05/24/2021, 2:47 PM  ? ?

## 2021-05-25 DIAGNOSIS — L89150 Pressure ulcer of sacral region, unstageable: Secondary | ICD-10-CM | POA: Diagnosis present

## 2021-05-25 MED ORDER — JUVEN PO PACK
1.0000 | PACK | Freq: Two times a day (BID) | ORAL | Status: DC
  Administered 2021-05-25 – 2021-06-03 (×18): 1 via ORAL
  Filled 2021-05-25 (×20): qty 1

## 2021-05-25 NOTE — Plan of Care (Signed)
Pt is alert oriented  4. Pt is RA, has male purewick in place pt has had 3 bowel movements. Pt has been turned and repositioned, dressing changed x 3. Wound consult in. Pt c/o generalized pain prn toradol  given per order, effective. Prn nausea medicine given as well.  ? ? ?Problem: Education: ?Goal: Knowledge of General Education information will improve ?Description: Including pain rating scale, medication(s)/side effects and non-pharmacologic comfort measures ?Outcome: Progressing ?  ?Problem: Health Behavior/Discharge Planning: ?Goal: Ability to manage health-related needs will improve ?Outcome: Progressing ?  ?Problem: Clinical Measurements: ?Goal: Ability to maintain clinical measurements within normal limits will improve ?Outcome: Progressing ?Goal: Will remain free from infection ?Outcome: Progressing ?Goal: Diagnostic test results will improve ?Outcome: Progressing ?Goal: Respiratory complications will improve ?Outcome: Progressing ?Goal: Cardiovascular complication will be avoided ?Outcome: Progressing ?  ?Problem: Activity: ?Goal: Risk for activity intolerance will decrease ?Outcome: Progressing ?  ?Problem: Nutrition: ?Goal: Adequate nutrition will be maintained ?Outcome: Progressing ?  ?Problem: Coping: ?Goal: Level of anxiety will decrease ?Outcome: Progressing ?  ?Problem: Elimination: ?Goal: Will not experience complications related to bowel motility ?Outcome: Progressing ?Goal: Will not experience complications related to urinary retention ?Outcome: Progressing ?  ?Problem: Pain Managment: ?Goal: General experience of comfort will improve ?Outcome: Progressing ?  ?Problem: Safety: ?Goal: Ability to remain free from injury will improve ?Outcome: Progressing ?  ?Problem: Skin Integrity: ?Goal: Risk for impaired skin integrity will decrease ?Outcome: Progressing ?  ?

## 2021-05-25 NOTE — Progress Notes (Signed)
Nutrition Follow-up ? ?DOCUMENTATION CODES:  ?Severe malnutrition in context of chronic illness ? ?INTERVENTION:  ?-d/c Ensure Enlive ?-1 packet Juven BID, each packet provides 95 calories, 2.5 grams of protein (collagen), and 9.8 grams of carbohydrate (3 grams sugar); also contains 7 grams of L-arginine and L-glutamine, 300 mg vitamin C, 15 mg vitamin E, 1.2 mcg vitamin B-12, 9.5 mg zinc, 200 mg calcium, and 1.5 g  Calcium Beta-hydroxy-Beta-methylbutyrate to support wound healing ?-continue Magic cup TID with meals, each supplement provides 290 kcal and 9 grams of protein ?-continue double protein portions TID w/ meals ?-continue MVI with minerals daily ?-Continue vitamin D 50,000 units weekly given vitamin D deficiency ?-continue vitamin A 10,000 units daily x 1 month given previously identified vitamin A deficiency (end date 5/05) ?-continue zinc sulfate 220 mg BID x 14 days given previously identified zinc deficiency (end date 4/19) ? ?NUTRITION DIAGNOSIS:  ?Severe Malnutrition related to chronic illness as evidenced by severe fat depletion, severe muscle depletion, percent weight loss (17.9% weight loss in less than 7 months). ?ongoing ? ?GOAL:  ?Patient will meet greater than or equal to 90% of their needs ?progressing ? ?MONITOR:  ?PO intake, Supplement acceptance, Labs, Weight trends, Skin, I & O's ? ?REASON FOR ASSESSMENT:  ?Malnutrition Screening Tool, Consult ?Assessment of nutrition requirement/status ? ?ASSESSMENT:  ?56 year old male who presented to the ED on 4/03 with fatigue, weakness, LE edema. PMH of severe malnutrition, HTN, anasarca, GERD, abdominal wall hernia. Pt with recent admission for abdominal hernia with incarceration s/p repair requiring TPN during admission. Pt left AMA from this admission. ? ?Pt continues to await SNF -- difficult placement per TOC.  Note WOC assessed pt today as stage 2 wound on the coccyx is now an unstageable pressure injury. Pt's PO intake has been great since last  RD assessment; 25-100% intake x 8 recorded meals (81% avg meal intake). Pt has been refusing Ensure Enlive. Given good PO intake, will discontinue this and order Juven to aid in wound healing.  ? ?Pt continues to have mild pitting edema to BUE and deep pitting edema to BLE per RN assessment.  ? ?Medications: ? cholestyramine  4 g Oral BID  ? multivitamin with minerals  1 tablet Oral Daily  ? pantoprazole  40 mg Oral Daily  ? vitamin A  10,000 Units Oral Daily  ? Vitamin D (Ergocalciferol)  50,000 Units Oral Q7 days  ? zinc sulfate  220 mg Oral BID  ? ?Labs: ?Recent Labs  ?Lab 05/19/21 ?[id number] 05/20/21 ?0351 05/23/21 ?0242 05/24/21 ?[id number]  ?NA 141 138 136 137  ?K 3.6 4.2 4.2 4.0  ?CL 114* 116* 111 113*  ?CO2 19* 19* 21* 20*  ?BUN 36* 31* 21* 20  ?CREATININE 0.83 0.69 0.69 0.44*  ?CALCIUM 7.8* 7.8* 7.3* 7.4*  ?MG 2.1  --  1.5* 1.8  ?PHOS  --   --  2.3* 2.5  ?GLUCOSE 64* 82 87 80  ?Hgb 9.2 ? ?Diet Order:   ?Diet Order   ? ?       ?  Diet regular Room service appropriate? Yes; Fluid consistency: Thin  Diet effective now       ?  ? ?  ?  ? ?  ? ?EDUCATION NEEDS:  ?Education needs have been addressed ? ?Skin:  Skin Assessment: Skin Integrity Issues: ?Skin Integrity Issues:: Stage II, Incisions ?Stage II: L buttock ?Incisions: closed abdomen ? ?Last BM:  4/18 type 5 ? ?Height:  ?Ht Readings from Last 1 Encounters:  ?  05/13/21 5\' 6"  (1.676 m)  ? ?Weight: ?Wt Readings from Last 1 Encounters:  ?05/13/21 65 kg  ? ?BMI:  Body mass index is 23.13 kg/m?. ? ?Estimated Nutritional Needs:  ?Kcal:  2100-2300 ?Protein:  100-115 grams ?Fluid:  >2.0 L ? ? ?Theone Stanley., MS, RD, LDN (she/her/hers) ?RD pager number and weekend/on-call pager number located in [place]. ?

## 2021-05-25 NOTE — Assessment & Plan Note (Deleted)
Clean the sacral area with no rinse cleanser. Apply a cut to fit piece of Aquacel Advantage over the wound and secure with a sacral foam dressing with tip up to help prevent soiling of the wound. Change Aquacel BID. ?

## 2021-05-25 NOTE — Consult Note (Signed)
WOC Nurse Consult Note: ?Patient receiving care in [facility] [phone] ?Reason for Consult: worsening stage 2  ?Wound type: Worsening stage 2 on the coccyx is now an Unstageable pressure injury ?Pressure Injury POA: Yes ?Measurement: 5 cm x 4 cm x 3 cm ?Wound bed: 100% loose slough and eschar ?Drainage (amount, consistency, odor) brown/tan malodorous  ?Periwound: intact ?Dressing procedure/placement/frequency: ?Clean the sacral area with no rinse cleanser. Apply a cut to fit piece of Aquacel Advantage Kellie Simmering # [id number]) over the wound and secure with a sacral foam dressing with tip up to help prevent soiling of the wound. Change Aquacel BID. ? ?Monitor the wound area(s) for worsening of condition such as: ?Signs/symptoms of infection, increase in size, development of or worsening of odor, ?development of pain, or increased pain at the affected locations.   ?Notify the medical team if any of these develop. ? ?Thank you for the consult. [place] nurse will not follow at this time.   ?Please re-consult the [place] team if needed.  ? ?Cathlean Marse[place]. Tamala Julian, MSN, RN, CMSRN, AGCNS, WTA ?Wound Treatment Associate ?Pager [phone]   ? ? ?  ?

## 2021-05-25 NOTE — Progress Notes (Signed)
Occupational Therapy Treatment ?Patient Details ?Name: Logan Nelson ?MRN: [id number] ?DOB: 17-Jan-1966 ?Today's Date: 05/25/2021 ? ? ?History of present illness Pt is a 66 admitted 4/3 with quickly progressing B LE weakness.  Recently hospitalize with incarcerated abdominal hernia, was repaired, but pt left AMA before he was safe to mobilize,  MRI of brain, cervical/thoracic spine inconclusive.  Work up continues.  PMHx: anemia, arthritis, dyspnea, GERD, HTN, LE edema ?  ?OT comments ? Patient received sitting on EOB and agreeable to OT session. Patient asked to use BSC and declined using regular toilet and would not attempt toilet hygiene and required max assist while standing. Patient performed mobility with RW and min assist for safety. Patient asked to go to bed and required assistance with BLE to get to side lying. Acute OT to continue to follow.   ? ?Recommendations for follow up therapy are one component of a multi-disciplinary discharge planning process, led by the attending physician.  Recommendations may be updated based on patient status, additional functional criteria and insurance authorization. ?   ?Follow Up Recommendations ? Skilled nursing-short term rehab (<3 hours/day)  ?  ?Assistance Recommended at Discharge Frequent or constant Supervision/Assistance  ?Patient can return home with the following ? A lot of help with walking and/or transfers;A lot of help with bathing/dressing/bathroom;Assist for transportation;Assistance with cooking/housework ?  ?Equipment Recommendations ? BSC/3in1;Tub/shower bench  ?  ?Recommendations for Other Services   ? ?  ?Precautions / Restrictions Precautions ?Precautions: Fall ?Precaution Comments: LE weakness, very incontinent ?Restrictions ?Weight Bearing Restrictions: No  ? ? ?  ? ?Mobility Bed Mobility ?Overal bed mobility: Needs Assistance ?Bed Mobility: Sit to Sidelying ?  ?  ?  ?  ?Sit to sidelying: Mod assist ?General bed mobility comments: required  assistance with BLE to get to side lying ?  ? ?Transfers ?Overall transfer level: Needs assistance ?Equipment used: Rolling walker (2 wheels) ?Transfers: Sit to/from Stand ?Sit to Stand: Min assist ?  ?  ?Step pivot transfers: Min assist ?  ?  ?General transfer comment: appeared to have fear of falling ?  ?  ?Balance Overall balance assessment: Needs assistance ?Sitting-balance support: Feet supported, Bilateral upper extremity supported ?Sitting balance-Leahy Scale: Fair ?Sitting balance - Comments: sitting on EOB upon arrival ?  ?Standing balance support: Single extremity supported, During functional activity, Bilateral upper extremity supported ?Standing balance-Leahy Scale: Poor ?Standing balance comment: dependent on RW ?  ?  ?  ?  ?  ?  ?  ?  ?  ?  ?  ?   ? ?ADL either performed or assessed with clinical judgement  ? ?ADL Overall ADL's : Needs assistance/impaired ?  ?  ?  ?  ?  ?  ?  ?  ?  ?  ?  ?  ?Toilet Transfer: Minimal assistance;Stand-pivot;BSC/3in1 ?Toilet Transfer Details (indicate cue type and reason): declined going to bathroom and asked for 3n1 ?Toileting- Clothing Manipulation and Hygiene: Maximal assistance;Sitting/lateral lean ?Toileting - Clothing Manipulation Details (indicate cue type and reason): patient encouraged to attempt rear peri care after BM but stated he couldn't ?  ?  ?  ?General ADL Comments: declined performing grooming tasks but perform toilet transfer with max assist for toilet hygiene ?  ? ?Extremity/Trunk Assessment Upper Extremity Assessment ?RUE Deficits / Details: uncoordinated, numb and pt not able to tell how his arm/hand is positioned without cues.Marland Kitchengeneral weakness, but moves laboriously against gravity. ?RUE Sensation: decreased light touch;decreased proprioception ?RUE Coordination: decreased fine motor;decreased gross motor ?LUE  Deficits / Details: generally weak overall at >=3/5 ?LUE Sensation: decreased light touch ?LUE Coordination: decreased fine motor ?  ?  ?  ?   ?  ? ?Vision   ?  ?  ?Perception   ?  ?Praxis   ?  ? ?Cognition Arousal/Alertness: Awake/alert ?Behavior During Therapy: Anxious ?Overall Cognitive Status: No family/caregiver present to determine baseline cognitive functioning ?  ?  ?  ?  ?  ?  ?  ?  ?  ?  ?  ?  ?  ?  ?  ?  ?General Comments: required cues for encouragement to attempt ADL tasks ?  ?  ?   ?Exercises   ? ?  ?Shoulder Instructions   ? ? ?  ?General Comments    ? ? ?Pertinent Vitals/ Pain       Pain Assessment ?Pain Assessment: Faces ?Faces Pain Scale: Hurts little more ?Pain Location: R UE ?Pain Descriptors / Indicators: Grimacing ?Pain Intervention(s): Limited activity within patient's tolerance, Monitored during session ? ?Home Living   ?  ?  ?  ?  ?  ?  ?  ?  ?  ?  ?  ?  ?  ?  ?  ?  ?  ?  ? ?  ?Prior Functioning/Environment    ?  ?  ?  ?   ? ?Frequency ? Min 2X/week  ? ? ? ? ?  ?Progress Toward Goals ? ?OT Goals(current goals can now be found in the care plan section) ? Progress towards OT goals: Progressing toward goals ? ?Acute Rehab OT Goals ?Patient Stated Goal: get better ?OT Goal Formulation: With patient ?Time For Goal Achievement: 05/26/21 ?Potential to Achieve Goals: Good ?ADL Goals ?Pt Will Perform Grooming: with supervision;standing ?Pt Will Perform Lower Body Bathing: with min guard assist;sitting/lateral leans;sit to/from stand ?Pt Will Perform Upper Body Dressing: with supervision;sitting ?Pt Will Perform Lower Body Dressing: with min guard assist;sitting/lateral leans;sit to/from stand ?Pt Will Transfer to Toilet: with min assist;stand pivot transfer ?Pt Will Perform Toileting - Clothing Manipulation and hygiene: with min guard assist;sitting/lateral leans;sit to/from stand ?Pt/caregiver will Perform Home Exercise Program: Increased strength;Both right and left upper extremity;With theraband;With Supervision;With written HEP provided ?Additional ADL Goal #1: Pt will tolerate 3 mins of dynamic standing task with min guard as a  precursor for safe ADL performance.  ?Plan Discharge plan remains appropriate   ? ?Co-evaluation ? ? ?   ?  ?  ?  ?  ? ?  ?AM-PAC OT "6 Clicks" Daily Activity     ?Outcome Measure ? ? Help from another person eating meals?: None ?Help from another person taking care of personal grooming?: A Little ?Help from another person toileting, which includes using toliet, bedpan, or urinal?: A Lot ?Help from another person bathing (including washing, rinsing, drying)?: A Lot ?Help from another person to put on and taking off regular upper body clothing?: A Little ?Help from another person to put on and taking off regular lower body clothing?: A Lot ?6 Click Score: 16 ? ?  ?End of Session Equipment Utilized During Treatment: Rolling walker (2 wheels);Gait belt ? ?OT Visit Diagnosis: Unsteadiness on feet (R26.81);Other abnormalities of gait and mobility (R26.89);Muscle weakness (generalized) (M62.81);History of falling (Z91.81) ?Hemiplegia - Right/Left: Right ?Hemiplegia - dominant/non-dominant: Dominant ?Hemiplegia - caused by: Unspecified ?  ?Activity Tolerance Patient tolerated treatment well ?  ?Patient Left in bed;with call bell/phone within reach;with bed alarm set ?  ?Nurse Communication Mobility status ?  ? ?   ? ?  Time: [id number] ?OT Time Calculation (min): 31 min ? ?Charges: OT General Charges ?$OT Visit: 1 Visit ?OT Treatments ?$Self Care/Home Management : 8-22 mins ?$Therapeutic Activity: 8-22 mins ? ?Lodema Hong, OTA ?Acute Rehabilitation Services  ?Pager [phone] ?Office [phone] ? ? ?[place] ?05/25/2021, 10:28 AM ?

## 2021-05-25 NOTE — Progress Notes (Signed)
?PROGRESS NOTE ? ? ? ?Logan Nelson  [id number] DOB: 1966-01-08 DOA: 05/10/2021 ?PCP: [name], MD  ? ? ?Brief Narrative:  ?Logan Nelson is a 56 y.o. male with past medical history of severe protein calorie malnutrition, hypertension, anasarca, abdominal wall hernia presented to the hospital with bilateral lower extremity weakness.  Patient was recently hospitalized for abdominal hernia with incarceration and had a open primary repair but then had left AMA before he was placed in rehab.  Patient was admitted this time with ambulatory dysfunction and bilateral lower extremity weakness. ? ?Assessment and plan ? ?Principal Problem: ?  Lower extremity weakness ?Active Problems: ?  Hypertension ?  Protein-calorie malnutrition, severe ?  Hypokalemia ?  Anemia of chronic disease ?  GERD (gastroesophageal reflux disease) ?  Generalized weakness ?  Hypophosphatemia ?  Hypomagnesemia ?  Vitamin D deficiency ?  Pressure injury of buttock, stage 2 ([place]) ?  ? ?BLE weakness/RUE weakness/ambulatory dysfunction, deconditioning ?Thought to be secondary to deconditioning and severe malnutrition.  Seen by neurology.  There was some enhancement on the MRI scan of the spine.  Neurosurgery was consulted and this looks like nonspecific finding .  No signs of infection.  Vitamin B1 level at 127.  Vitamin B12 at 573.   TSH at 2.9.  Vitamin D level very low at 19.7.  Seen by physical therapy with recommendation of SNF placement. ?--Vitamin D 50,000 units every 7 days ?--TOC for SNF placement ? ?Severe protein-calorie malnutrition with mild hyperglycemia. ?Present on admission.  Continue supplementation.  Albumin at 1.8.  Will encourage oral nutrition.  Continue Ensure supplements. ?  ?Anasarca ?Secondary to severe malnutrition and low albumin.  Encouraged oral intake. ?  ?S/p open primary repair of chronically incarcerated ventral incisional hernia, primary right inguinal hernia repair without mesh, extensive lysis of  adhesions on 04/30/2021. ?--Continue dressing changes ? ?Diarrhea. ?Negative C. difficile screen.  GI pathogen panel was negative. Could be bile acid associated diarrhea.  He states that his diarrhea started after cholecystectomy.      ?--Lomotil, loperamide as needed  ?--Cholestyramine 4 g p.o. twice daily  ? ?HTN ?Borderline low.  Not on any medication.  Continue to monitor. ? ?Anemia of chronic disease ?Latest hemoglobin of 10.4.  We will continue to monitor. ?    ?GERD ?On Protonix. ?  ?Hypokalemia ?Repleted during hospitalization.  Potassium 4.0 this morning. ?--Continue monitor electrolytes ? ?Hypophosphatemia.   ?Replenished during hospitalization.  Phosphorus of 2.5 today. ? ?Vitamin D deficiency  ?Vitamin D level at 19.79.   ?--Continue 50,000 unit vitamin D q. weekly  ? ?Hypomagnesemia.   ?Repleted.  Magnesium 1.8 this morning.  ? ? ? ? ?DVT prophylaxis: Place TED hose Start: 05/21/21 1624 ?SCDs Start: 05/11/21 2009 ? ?  Code Status: Full Code ?Family Communication: No family present at bedside ? ?Disposition Plan:  ?Level of care: Telemetry Medical ?Status is: Inpatient ?Remains inpatient appropriate because: Awaiting SNF placement, per TOC ?  ? ?Consultants:  ?None ? ?Procedures:  ?None ? ?Antimicrobials:  ?None ? ? ?Subjective: ?Patient seen examined at bedside, resting comfortably.  Sitting at edge of bed.  About to go walking with PT this morning.  No specific complaints this morning.  Awaiting SNF placement, difficult per TOC.  Denies headache, no vision changes, no chest pain, no palpitations, no shortness of breath, no abdominal pain, no focal weakness, no paresthesias, no fever/chills/night sweats, no nausea/vomiting.  No acute events overnight per nurse staff. ? ?Objective: ?Vitals:  ?  05/24/21 2117 05/25/21 0138 05/25/21 0353 05/25/21 0850  ?BP: 118/87 116/86 118/82 (!) 126/92  ?Pulse: 88 68 72 94  ?Resp: (!) 22 20 18 18   ?Temp: 99.9 ?F (37.7 ?C) 98.4 ?F (36.9 ?C) 99 ?F (37.2 ?C) 99 ?F (37.2 ?C)   ?TempSrc: Oral Oral Oral Oral  ?SpO2: 99% 100% 100% 100%  ?Weight:      ?Height:      ? ? ?Intake/Output Summary (Last 24 hours) at 05/25/2021 1248 ?Last data filed at 05/25/2021 0447 ?Gross per 24 hour  ?Intake 120 ml  ?Output 550 ml  ?Net -430 ml  ? ?Filed Weights  ? 05/13/21 1600  ?Weight: 65 kg  ? ? ?Examination: ? ?Physical Exam: ?GEN: NAD, alert and oriented, chronically ill/cachectic in appearance, appears older than stated age ?HEENT: NCAT, PERRL, EOMI, sclera clear, MMM, poor dentition ?PULM: CTAB w/o wheezes/crackles, normal respiratory effort, on room air ?CV: RRR w/o M/G/R ?GI: abd soft, NTND, NABS, no R/G/M ?MSK: 2-3+ bilateral lower extremity edema, moves all extremities independently ?NEURO: CN II-XII intact, no focal deficits, sensation to light touch intact ?PSYCH: Depressed mood/flat affect ? ? ? ?Data Reviewed: I have personally reviewed following labs and imaging studies ? ?CBC: ?Recent Labs  ?Lab 05/19/21 ?[id number] 05/23/21 ?0242 05/24/21 ?[id number]  ?WBC 8.8 13.7* 13.0*  ?HGB 9.8* 9.3* 9.2*  ?HCT 31.2* 28.4* 27.7*  ?MCV 96.0 91.9 91.1  ?PLT 285 263 248  ? ?Basic Metabolic Panel: ?Recent Labs  ?Lab 05/19/21 ?[id number] 05/20/21 ?0351 05/23/21 ?0242 05/24/21 ?[id number]  ?NA 141 138 136 137  ?K 3.6 4.2 4.2 4.0  ?CL 114* 116* 111 113*  ?CO2 19* 19* 21* 20*  ?GLUCOSE 64* 82 87 80  ?BUN 36* 31* 21* 20  ?CREATININE 0.83 0.69 0.69 0.44*  ?CALCIUM 7.8* 7.8* 7.3* 7.4*  ?MG 2.1  --  1.5* 1.8  ?PHOS  --   --  2.3* 2.5  ? ?GFR: ?Estimated Creatinine Clearance: 90.8 mL/min (A) (by C-G formula based on SCr of 0.44 mg/dL (L)). ?Liver Function Tests: ?No results for input(s): AST, ALT, ALKPHOS, BILITOT, PROT, ALBUMIN in the last 168 hours. ?No results for input(s): LIPASE, AMYLASE in the last 168 hours. ?No results for input(s): AMMONIA in the last 168 hours. ?Coagulation Profile: ?No results for input(s): INR, PROTIME in the last 168 hours. ?Cardiac Enzymes: ?No results for input(s): CKTOTAL, CKMB, CKMBINDEX, TROPONINI in the last  168 hours. ?BNP (last 3 results) ?No results for input(s): PROBNP in the last 8760 hours. ?HbA1C: ?No results for input(s): HGBA1C in the last 72 hours. ?CBG: ?No results for input(s): GLUCAP in the last 168 hours. ?Lipid Profile: ?No results for input(s): CHOL, HDL, LDLCALC, TRIG, CHOLHDL, LDLDIRECT in the last 72 hours. ?Thyroid Function Tests: ?No results for input(s): TSH, T4TOTAL, FREET4, T3FREE, THYROIDAB in the last 72 hours. ?Anemia Panel: ?No results for input(s): VITAMINB12, FOLATE, FERRITIN, TIBC, IRON, RETICCTPCT in the last 72 hours. ?Sepsis Labs: ?No results for input(s): PROCALCITON, LATICACIDVEN in the last 168 hours. ? ?No results found for this or any previous visit (from the past 240 hour(s)).  ? ? ? ? ? ?Radiology Studies: ?No results found. ? ? ? ? ? ?Scheduled Meds: ? aspirin  81 mg Oral Daily  ? cholestyramine  4 g Oral BID  ? multivitamin with minerals  1 tablet Oral Daily  ? nutrition supplement (JUVEN)  1 packet Oral BID BM  ? nystatin  5 mL Oral QID  ? pantoprazole  40 mg Oral Daily  ?  vitamin A  10,000 Units Oral Daily  ? Vitamin D (Ergocalciferol)  50,000 Units Oral Q7 days  ? zinc sulfate  220 mg Oral BID  ? ?Continuous Infusions: ? ? LOS: 13 days  ? ? ?Time spent: 43 minutes spent on chart review, discussion with nursing staff, consultants, updating family and interview/physical exam; more than 50% of that time was spent in counseling and/or coordination of care. ? ? ? ?Chibuike Fleek J [place] DO ?[facility] ?Available via Epic secure chat 7am-7pm ?After these hours, please refer to coverage provider listed on [email or web address] ?05/25/2021, 12:48 PM  ? ?

## 2021-05-26 DIAGNOSIS — K2100 Gastro-esophageal reflux disease with esophagitis, without bleeding: Secondary | ICD-10-CM

## 2021-05-26 LAB — COMPREHENSIVE METABOLIC PANEL
ALT: 19 U/L (ref 0–44)
AST: 16 U/L (ref 15–41)
Albumin: <1.5 g/dL — ABNORMAL LOW (ref 3.5–5.0)
Alkaline Phosphatase: 107 U/L (ref 38–126)
Anion gap: 4 — ABNORMAL LOW (ref 5–15)
BUN: 22 mg/dL — ABNORMAL HIGH (ref 6–20)
CO2: 19 mmol/L — ABNORMAL LOW (ref 22–32)
Calcium: 7 mg/dL — ABNORMAL LOW (ref 8.9–10.3)
Chloride: 113 mmol/L — ABNORMAL HIGH (ref 98–111)
Creatinine, Ser: 0.55 mg/dL — ABNORMAL LOW (ref 0.61–1.24)
GFR, Estimated: >60 mL/min (ref >60)
Glucose, Bld: 89 mg/dL (ref 70–99)
Potassium: 3.7 mmol/L (ref 3.5–5.1)
Sodium: 136 mmol/L (ref 135–145)
Total Bilirubin: 0.7 mg/dL (ref 0.3–1.2)
Total Protein: 4.5 g/dL — ABNORMAL LOW (ref 6.5–8.1)

## 2021-05-26 LAB — CBC WITH DIFFERENTIAL/PLATELET
Abs Immature Granulocytes: 0.04 10*3/uL (ref 0.00–0.07)
Basophils Absolute: 0 10*3/uL (ref 0.0–0.1)
Basophils Relative: 0 %
Eosinophils Absolute: 0 10*3/uL (ref 0.0–0.5)
Eosinophils Relative: 0 %
HCT: 25.4 % — ABNORMAL LOW (ref 39.0–52.0)
Hemoglobin: 8.5 g/dL — ABNORMAL LOW (ref 13.0–17.0)
Immature Granulocytes: 0 %
Lymphocytes Relative: 11 %
Lymphs Abs: 1.2 10*3/uL (ref 0.7–4.0)
MCH: 30.6 pg (ref 26.0–34.0)
MCHC: 33.5 g/dL (ref 30.0–36.0)
MCV: 91.4 fL (ref 80.0–100.0)
Monocytes Absolute: 1.3 10*3/uL — ABNORMAL HIGH (ref 0.1–1.0)
Monocytes Relative: 13 %
Neutro Abs: 7.8 10*3/uL — ABNORMAL HIGH (ref 1.7–7.7)
Neutrophils Relative %: 76 %
Platelets: 357 10*3/uL (ref 150–400)
RBC: 2.78 MIL/uL — ABNORMAL LOW (ref 4.22–5.81)
RDW: 15.5 % (ref 11.5–15.5)
WBC: 10.3 10*3/uL (ref 4.0–10.5)
nRBC: 0 % (ref 0.0–0.2)

## 2021-05-26 LAB — PHOSPHORUS: Phosphorus: 2.8 mg/dL (ref 2.5–4.6)

## 2021-05-26 LAB — MAGNESIUM: Magnesium: 1.5 mg/dL — ABNORMAL LOW (ref 1.7–2.4)

## 2021-05-26 MED ORDER — MAGNESIUM SULFATE 2 GM/50ML IV SOLN
2.0000 g | Freq: Once | INTRAVENOUS | Status: AC
  Administered 2021-05-26: 2 g via INTRAVENOUS
  Filled 2021-05-26: qty 50

## 2021-05-26 MED ORDER — CALCIUM GLUCONATE-NACL 1-0.675 GM/50ML-% IV SOLN
1.0000 g | Freq: Once | INTRAVENOUS | Status: AC
  Administered 2021-05-26: 1000 mg via INTRAVENOUS
  Filled 2021-05-26: qty 50

## 2021-05-26 NOTE — Progress Notes (Signed)
Physical Therapy Treatment ?Patient Details ?Name: Logan Nelson ?MRN: [id number] ?DOB: Oct 05, 1965 ?Today's Date: 05/26/2021 ? ? ?History of Present Illness Pt is a 74 admitted 4/3 with quickly progressing B LE weakness.  Recently hospitalize with incarcerated abdominal hernia, was repaired, but pt left AMA before he was safe to mobilize,  MRI of brain, cervical/thoracic spine inconclusive.  Work up continues.  PMHx: anemia, arthritis, dyspnea, GERD, HTN, LE edema ? ?  ?PT Comments  ? ? Pt remains to have bilat LE and UE pitting edema with very distended abdomen with generalized weakness, decreased activity tolerance, and impaired balance. Pt requiring use of RW for safe ambulation and transfers. Pt also with noted decreased safety awareness. Acute PT to continue to follow. ?   ?Recommendations for follow up therapy are one component of a multi-disciplinary discharge planning process, led by the attending physician.  Recommendations may be updated based on patient status, additional functional criteria and insurance authorization. ? ?Follow Up Recommendations ? Skilled nursing-short term rehab (<3 hours/day) ?  ?  ?Assistance Recommended at Discharge Frequent or constant Supervision/Assistance  ?Patient can return home with the following Assistance with cooking/housework;Direct supervision/assist for medications management;Direct supervision/assist for financial management;Assist for transportation;Help with stairs or ramp for entrance;A little help with walking and/or transfers ?  ?Equipment Recommendations ? Rolling walker (2 wheels)  ?  ?Recommendations for Other Services Rehab consult ? ? ?  ?Precautions / Restrictions Precautions ?Precautions: Fall ?Precaution Comments: bilat LE edema, R UE edema ?Restrictions ?Weight Bearing Restrictions: No  ?  ? ?Mobility ? Bed Mobility ?  ?  ?  ?  ?  ?  ?  ?General bed mobility comments: pt sitting EOB upon PT arrival ?  ? ?Transfers ?Overall transfer level: Needs  assistance ?Equipment used: Rolling walker (2 wheels) ?Transfers: Sit to/from Stand ?Sit to Stand: Min assist ?  ?  ?  ?  ?  ?General transfer comment: minA to power up from elevated surface, verbal cues to push up from bed however continues to pull up from walker requiring PT to hold walker down ?  ? ?Ambulation/Gait ?Ambulation/Gait assistance: Min assist ?Gait Distance (Feet): 75 Feet ?Assistive device: Rolling walker (2 wheels) ?Gait Pattern/deviations: Decreased stride length, Step-to pattern, Decreased step length - right, Decreased step length - left ?Gait velocity: decreased ?Gait velocity interpretation: <1.31 ft/sec, indicative of household ambulator ?  ?General Gait Details: pt with slow steady gait, dependent on RW due to bialt LE weakness, pt with noted bilat LE edema and R UE edema ? ? ?Stairs ?  ?  ?  ?  ?  ? ? ?Wheelchair Mobility ?  ? ?Modified Rankin (Stroke Patients Only) ?  ? ? ?  ?Balance Overall balance assessment: Needs assistance ?Sitting-balance support: Feet supported, Bilateral upper extremity supported ?Sitting balance-Leahy Scale: Fair ?Sitting balance - Comments: sitting on EOB upon arrival ?Postural control: Posterior lean ?Standing balance support: Single extremity supported, During functional activity, Bilateral upper extremity supported ?Standing balance-Leahy Scale: Poor ?Standing balance comment: dependent on RW ?  ?  ?  ?  ?  ?  ?  ?  ?  ?  ?  ?  ? ?  ?Cognition Arousal/Alertness: Awake/alert ?Behavior During Therapy: [facility] for tasks assessed/performed ?Overall Cognitive Status: No family/caregiver present to determine baseline cognitive functioning ?Area of Impairment: Problem solving, Safety/judgement, Awareness ?  ?  ?  ?  ?  ?  ?  ?  ?  ?  ?  ?Following Commands: Follows one  step commands with increased time ?Safety/Judgement: Decreased awareness of safety, Decreased awareness of deficits ?Awareness: Emergent ?Problem Solving: Slow processing, Decreased initiation ?General  Comments: pt can become irritated, demanding, does things his way, resists feedback ?  ?  ? ?  ?Exercises   ? ?  ?General Comments General comments (skin integrity, edema, etc.): pt with pitting edema t/o bilat LEs and UEs R>L ?  ?  ? ?Pertinent Vitals/Pain Pain Assessment ?Pain Assessment: Faces ?Faces Pain Scale: Hurts a little bit ?Pain Location: R UE ?Pain Descriptors / Indicators: Grimacing (with movement) ?Pain Intervention(s): Monitored during session  ? ? ?Home Living   ?  ?  ?  ?  ?  ?  ?  ?  ?  ?   ?  ?Prior Function    ?  ?  ?   ? ?PT Goals (current goals can now be found in the care plan section) Acute Rehab PT Goals ?PT Goal Formulation: With patient ?Time For Goal Achievement: 06/09/21 ?Potential to Achieve Goals: Good ?Progress towards PT goals: Progressing toward goals ? ?  ?Frequency ? ? ? Min 3X/week ? ? ? ?  ?PT Plan Current plan remains appropriate  ? ? ?Co-evaluation   ?  ?  ?  ?  ? ?  ?AM-PAC PT "6 Clicks" Mobility   ?Outcome Measure ? Help needed turning from your back to your side while in a flat bed without using bedrails?: A Little ?Help needed moving from lying on your back to sitting on the side of a flat bed without using bedrails?: A Little ?Help needed moving to and from a bed to a chair (including a wheelchair)?: A Lot ?Help needed standing up from a chair using your arms (e.g., wheelchair or bedside chair)?: A Lot ?Help needed to walk in hospital room?: A Lot ?Help needed climbing 3-5 steps with a railing? : Total ?6 Click Score: 13 ? ?  ?End of Session Equipment Utilized During Treatment: Gait belt ?Activity Tolerance: Patient tolerated treatment well ?Patient left: in chair;with chair alarm set;with call bell/phone within reach ?Nurse Communication: Mobility status ?PT Visit Diagnosis: Other abnormalities of gait and mobility (R26.89);Muscle weakness (generalized) (M62.81);History of falling (Z91.81) ?Pain - part of body: Leg;Ankle and joints of foot ?  ? ? ?Time: [id number] ?PT  Time Calculation (min) (ACUTE ONLY): 22 min ? ?Charges:  $Gait Training: 8-22 mins          ?          ? ?Kittie Plater, PT, DPT ?Acute Rehabilitation Services ?Secure chat preferred ?Office #: [phone] ? ? ? ?Stormi Vandevelde M Hema Lanza ?05/26/2021, 1:11 PM ? ?

## 2021-05-26 NOTE — Progress Notes (Signed)
?PROGRESS NOTE ? ? ? ?Logan Nelson  [id number] DOB: Jun 26, 1965 DOA: 05/10/2021 ?PCP: [name], MD  ? ?Brief Narrative:  ?Logan Nelson is a 55 y.o. male with past medical history of severe protein calorie malnutrition, hypertension, anasarca, abdominal wall hernia presented to the hospital with bilateral lower extremity weakness.  Patient was recently hospitalized for abdominal hernia with incarceration and had a open primary repair but then had left AMA before he was placed in rehab.  Patient was admitted this time with ambulatory dysfunction and bilateral lower extremity weakness.  He is improving slowly but is unsafe discharge disposition as he is homeless and PT OT recommending SNF but currently no bed offers. ? ?Assessment and plan ? ?Principal Problem: ?  Lower extremity weakness ?Active Problems: ?  Hypertension ?  Protein-calorie malnutrition, severe ?  Hypokalemia ?  Anemia of chronic disease ?  GERD (gastroesophageal reflux disease) ?  Generalized weakness ?  Hypophosphatemia ?  Hypomagnesemia ?  Vitamin D deficiency ?  Pressure injury of buttock, stage 2 ([place]) ?  Pressure injury of sacral region, unstageable ([place]) ?  ? ?BLE weakness/RUE weakness/ambulatory dysfunction, deconditioning ?-Thought to be secondary to deconditioning and severe malnutrition.   ?-Seen by neurology.   ?-There was some enhancement on the MRI scan of the spine.  -Neurosurgery was consulted and this looks like nonspecific finding .  No signs of infection.   ?-Vitamin B1 level at 127.  Vitamin B12 at 573.   TSH at 2.975  -Vitamin D level very low at 19.7.   ?-Seen by physical therapy with recommendation of SNF placement. ?-U/A Uremarkable ?-C/w Vitamin D 50,000 units every 7 days ?-TOC for SNF placement for safe discharge disposition however he is a difficult placement given no insurance and homelessness ? ?Severe protein-calorie malnutrition with mild hyperglycemia. ?-Present on admission.   ?-Nutrition  Status: ?Nutrition Problem: Severe Malnutrition ?Etiology: chronic illness ?Signs/Symptoms: severe fat depletion, severe muscle depletion, percent weight loss (17.9% weight loss in less than 7 months) ?Percent weight loss: 17.9 % ?Interventions: MVI, Boost Breeze, Refer to RD note for recommendations, Other (Comment), Magic cup (vitamin D, double protein) ?-Continue supplementation.  Albumin now <1.5.   ?-Will encourage oral nutrition.  Continue Ensure supplements. ?  ?Anasarca ?-Secondary to severe malnutrition and low albumin.  -Encouraged oral intake and C/w Juven 1 packet po BID. ?  ?S/p open primary repair of chronically incarcerated ventral incisional hernia, primary right inguinal hernia repair without mesh, extensive lysis of adhesions on 04/30/2021. ?-Leukocytosis has resolved ?-Continue dressing changes ? ?Diarrhea. ?-Negative C. difficile screen.   ?-GI pathogen panel was negative. Could be bile acid associated diarrhea.  He states that his diarrhea started after cholecystectomy.      ?-C/w Lomotil, loperamide as needed  ?-C/w Cholestyramine 4 g p.o. twice daily  ? ?HTN ?-Borderline low with last BP being 119/87 ?-Not on any medications.   ?-Continue to monitor BP's per Protocol  ? ?Anemia of chronic disease ?-Hgb/Hct slowly trending down and went from 10.4/33.0 -> 9.8/31.2 -> 9.3/28.4 -> 9.2/27.7 -> 8.5/25.4 ?-Check Anemia Panel in the AM ?-Continue to Monitor for S/Sx of Bleeding; No overt bleeding noted ?-Repeat CBC in the AM  ? ?Metabolic Acidosis ?-Mild. CO2 is 19, AG is 4, and Chloride Level is now 113 ?-Continue to Monitor and Trend ?-Repeat CMP in the AM  ?    ?GERD ?-C/w PPI with Pantoprazole 40 mg po Daily  ?  ?Hypokalemia ?-Replete during hospitalization.  Potassium 3.7 this  morning. ?-Continue monitor electrolytes and replete as necessary ?-Repeat CMP in the AM ? ?Hypocalcemia ?-Patient's Ca2+ was 7.0 this AM ?-Corrected for an Albumin of <1.5 it was 9.1 ?-Replete with IV Calcium Gluconate 1  gram ?-Continue to Monitor and Replete as Necessary ? ?Leukocytosis ?-Likely Reactive and Improving ?-WBC went from 8.8 -> 13.7 -> 13.0 -> 10.3 ?-Continue to Monitor and Trend as Necessary ?-Repeat CBC in the AM  ? ?Hypophosphatemia.   ?-Replenished during hospitalization.  Phosphorus of 2.8 today. ? ?Vitamin D Deficiency  ?-Vitamin D level at 19.79.   ?-Continue 50,000 unit vitamin D q. weekly  ? ?Hypomagnesemia.   ?-Mag was 1.5 ?-Replete with IV Mag Sulfate 2 grams ?-Continue to Monitor and Replete as necessary  ?-Repeat Mag Level in the AM  ? ? ?Assessment and Plan: ?Pressure injury of sacral region, unstageable ([place]) ?Clean the sacral area with no rinse cleanser. Apply a cut to fit piece of Aquacel Advantage over the wound and secure with a sacral foam dressing with tip up to help prevent soiling of the wound. Change Aquacel BID. ? ?DVT prophylaxis: Place TED hose Start: 05/21/21 1624 ?SCDs Start: 05/11/21 2009 ? ?  Code Status: Full Code ?Family Communication: No family currently at bedside ? ?Disposition Plan:  ?Level of care: Telemetry Medical ?Status is: Inpatient ?Remains inpatient appropriate because: Needs a safe discharge disposition and SNF placement but likely difficult due to no insurance and homelessness issues ?  ?Consultants:  ?[place] Nurse ?Neurology ?Neurosurgery ?PCCM last admission  ? ?Procedures:  ?None ? ?Antimicrobials:  ?Anti-infectives (From admission, onward)  ? ? None  ? ?  ?  ?Subjective: ?Seen and examined at bedside and he had flipped his position and was lying with his head toward the further bed and felt okay.  Asking about when he can leave.  Denies any chest pain or shortness of breath.  Had no specific complaints but did have some mild abdominal discomfort.  No chest pain or lightheadedness or dizziness.  Continues to have significant leg swelling.  No other concerns or complaints at this time and continues to await SNF placement but this is difficult per TOC. ? ?Objective: ?Vitals:   ? 05/25/21 2308 05/26/21 0354 05/26/21 0822 05/26/21 1102  ?BP: 125/87 102/66 110/76 119/87  ?Pulse: 73 67 68 71  ?Resp: 18 18 18 20   ?Temp: 99.4 ?F (37.4 ?C) 97.8 ?F (36.6 ?C) 98.6 ?F (37 ?C) 98.5 ?F (36.9 ?C)  ?TempSrc: Oral Oral Oral Oral  ?SpO2: 100% 100% 100% 100%  ?Weight:      ?Height:      ? ? ?Intake/Output Summary (Last 24 hours) at 05/26/2021 1317 ?Last data filed at 05/26/2021 0800 ?Gross per 24 hour  ?Intake 240 ml  ?Output 450 ml  ?Net -210 ml  ? ?Filed Weights  ? 05/13/21 1600  ?Weight: 65 kg  ? ?Examination: ?Physical Exam: ? ?Constitutional: Thin cachectic chronically ill-appearing African-American male who appears older than his stated age in no acute distress lying in the bed ?Respiratory: Diminished to auscultation bilaterally with coarse breath sounds, no wheezing, rales, rhonchi or crackles. Normal respiratory effort and patient is not tachypenic. No accessory muscle use.  Unlabored breathing and not wearing supplemental oxygen via nasal cannula ?Cardiovascular: RRR, no murmurs / rubs / gallops. S1 and S2 auscultated.  2-3.  Family pitting edema bilaterally ?Abdomen: Soft, non-tender, mildly distended. Bowel sounds positive.  ?GU: Deferred. ?Musculoskeletal: No clubbing / cyanosis of digits/nails. No joint deformity upper and lower extremities.  ?  Neurologic: CN 2-12 grossly intact with no focal deficits.  Romberg sign cerebellar reflexes not assessed.  ?Psychiatric: Normal judgment and insight. Alert and oriented x 3.  Mildly anxious and slightly depressed and has a flat affect ? ?Data Reviewed: I have personally reviewed following labs and imaging studies ? ?CBC: ?Recent Labs  ?Lab 05/23/21 ?0242 05/24/21 ?[id number] 05/26/21 ?1053  ?WBC 13.7* 13.0* 10.3  ?NEUTROABS  --   --  7.8*  ?HGB 9.3* 9.2* 8.5*  ?HCT 28.4* 27.7* 25.4*  ?MCV 91.9 91.1 91.4  ?PLT 263 248 357  ? ?Basic Metabolic Panel: ?Recent Labs  ?Lab 05/20/21 ?0351 05/23/21 ?0242 05/24/21 ?[id number] 05/26/21 ?1053  ?NA 138 136 137 136  ?K 4.2 4.2  4.0 3.7  ?CL 116* 111 113* 113*  ?CO2 19* 21* 20* 19*  ?GLUCOSE 82 87 80 89  ?BUN 31* 21* 20 22*  ?CREATININE 0.69 0.69 0.44* 0.55*  ?CALCIUM 7.8* 7.3* 7.4* 7.0*  ?MG  --  1.5* 1.8 1.5*  ?PHOS  --  2.3* 2.5 2.8

## 2021-05-26 NOTE — Plan of Care (Signed)
Pt is alert oriented x 4. Pt has generalized edema especially to bilateral lower extremities. PT has ted hose one. PRN pain medication given for generalized pain. Pt has had one large bowel movement. Pt turned and repositioned in bed. Dressing to sacrum changed per order. Old dressing had moderate drainage and stool around dressing.  ? ? ? ?Problem: Education: ?Goal: Knowledge of General Education information will improve ?Description: Including pain rating scale, medication(s)/side effects and non-pharmacologic comfort measures ?Outcome: Progressing ?  ?Problem: Health Behavior/Discharge Planning: ?Goal: Ability to manage health-related needs will improve ?Outcome: Progressing ?  ?Problem: Clinical Measurements: ?Goal: Ability to maintain clinical measurements within normal limits will improve ?Outcome: Progressing ?Goal: Will remain free from infection ?Outcome: Progressing ?Goal: Diagnostic test results will improve ?Outcome: Progressing ?Goal: Respiratory complications will improve ?Outcome: Progressing ?Goal: Cardiovascular complication will be avoided ?Outcome: Progressing ?  ?Problem: Activity: ?Goal: Risk for activity intolerance will decrease ?Outcome: Progressing ?  ?Problem: Nutrition: ?Goal: Adequate nutrition will be maintained ?Outcome: Progressing ?  ?Problem: Coping: ?Goal: Level of anxiety will decrease ?Outcome: Progressing ?  ?Problem: Elimination: ?Goal: Will not experience complications related to bowel motility ?Outcome: Progressing ?Goal: Will not experience complications related to urinary retention ?Outcome: Progressing ?  ?Problem: Pain Managment: ?Goal: General experience of comfort will improve ?Outcome: Progressing ?  ?Problem: Safety: ?Goal: Ability to remain free from injury will improve ?Outcome: Progressing ?  ?Problem: Skin Integrity: ?Goal: Risk for impaired skin integrity will decrease ?Outcome: Progressing ?  ?

## 2021-05-26 NOTE — TOC Progression Note (Signed)
Transition of Care (TOC) - Progression Note  ? ? ?Patient Details  ?Name: Logan Nelson ?MRN: [id number] ?Date of Birth: [birth year] ? ?Transition of Care (TOC) CM/SW Contact  ?Pollie Friar, RN ?Phone Number: ?05/26/2021, 3:48 PM ? ?Clinical Narrative:    ?Patient getting anxious about getting out of the hospital. CM met with him and updated that we are still waiting on Medicaid application. He wont be able to be placed until he has medicaid. He asked that I call a friend: Braulio Conte to see if he can stay with him. CM called Braulio Conte and he is not able to take the patient in.  ?TOC following. ? ? ?Expected Discharge Plan: [place] ?Barriers to Discharge: Continued Medical Work up, Inadequate or no insurance, SNF Pending payor source - LOG ? ?Expected Discharge Plan and Services ?Expected Discharge Plan: [place] ?In-house Referral: Clinical Social Work ?Discharge Planning Services: CM Consult ?  ?Living arrangements for the past 2 months: [place] ?                ?  ?  ?  ?  ?  ?  ?  ?  ?  ?  ? ? ?Social Determinants of Health (SDOH) Interventions ?  ? ?Readmission Risk Interventions ? ?  05/05/2021  ? 10:01 AM  ?Readmission Risk Prevention Plan  ?Transportation Screening Complete  ?Medication Review Press photographer) Referral to Pharmacy  ?PCP or Specialist appointment within 3-5 days of discharge Complete  ?[place] or Home Care Consult Patient refused  ?SW Recovery Care/Counseling Consult Complete  ?Palliative Care Screening Not Applicable  ?Skilled Nursing Facility Complete  ? ? ?

## 2021-05-27 DIAGNOSIS — L89150 Pressure ulcer of sacral region, unstageable: Secondary | ICD-10-CM

## 2021-05-27 DIAGNOSIS — K2100 Gastro-esophageal reflux disease with esophagitis, without bleeding: Secondary | ICD-10-CM

## 2021-05-27 LAB — CBC WITH DIFFERENTIAL/PLATELET
Abs Immature Granulocytes: 0.04 10*3/uL (ref 0.00–0.07)
Basophils Absolute: 0 10*3/uL (ref 0.0–0.1)
Basophils Relative: 0 %
Eosinophils Absolute: 0 10*3/uL (ref 0.0–0.5)
Eosinophils Relative: 0 %
HCT: 24.2 % — ABNORMAL LOW (ref 39.0–52.0)
Hemoglobin: 8.2 g/dL — ABNORMAL LOW (ref 13.0–17.0)
Immature Granulocytes: 0 %
Lymphocytes Relative: 14 %
Lymphs Abs: 1.4 10*3/uL (ref 0.7–4.0)
MCH: 30.6 pg (ref 26.0–34.0)
MCHC: 33.9 g/dL (ref 30.0–36.0)
MCV: 90.3 fL (ref 80.0–100.0)
Monocytes Absolute: 1.5 10*3/uL — ABNORMAL HIGH (ref 0.1–1.0)
Monocytes Relative: 15 %
Neutro Abs: 7.1 10*3/uL (ref 1.7–7.7)
Neutrophils Relative %: 71 %
Platelets: 364 10*3/uL (ref 150–400)
RBC: 2.68 MIL/uL — ABNORMAL LOW (ref 4.22–5.81)
RDW: 15.5 % (ref 11.5–15.5)
WBC: 10.1 10*3/uL (ref 4.0–10.5)
nRBC: 0 % (ref 0.0–0.2)

## 2021-05-27 LAB — COMPREHENSIVE METABOLIC PANEL
ALT: 18 U/L (ref 0–44)
AST: 15 U/L (ref 15–41)
Albumin: <1.5 g/dL — ABNORMAL LOW (ref 3.5–5.0)
Alkaline Phosphatase: 102 U/L (ref 38–126)
Anion gap: 4 — ABNORMAL LOW (ref 5–15)
BUN: 20 mg/dL (ref 6–20)
CO2: 19 mmol/L — ABNORMAL LOW (ref 22–32)
Calcium: 7 mg/dL — ABNORMAL LOW (ref 8.9–10.3)
Chloride: 112 mmol/L — ABNORMAL HIGH (ref 98–111)
Creatinine, Ser: 0.48 mg/dL — ABNORMAL LOW (ref 0.61–1.24)
GFR, Estimated: >60 mL/min (ref >60)
Glucose, Bld: 79 mg/dL (ref 70–99)
Potassium: 3.8 mmol/L (ref 3.5–5.1)
Sodium: 135 mmol/L (ref 135–145)
Total Bilirubin: 0.7 mg/dL (ref 0.3–1.2)
Total Protein: 4.4 g/dL — ABNORMAL LOW (ref 6.5–8.1)

## 2021-05-27 LAB — MAGNESIUM: Magnesium: 1.8 mg/dL (ref 1.7–2.4)

## 2021-05-27 LAB — PHOSPHORUS: Phosphorus: 2.3 mg/dL — ABNORMAL LOW (ref 2.5–4.6)

## 2021-05-27 MED ORDER — MAGNESIUM SULFATE 2 GM/50ML IV SOLN
2.0000 g | Freq: Once | INTRAVENOUS | Status: AC
  Administered 2021-05-27: 2 g via INTRAVENOUS
  Filled 2021-05-27: qty 50

## 2021-05-27 MED ORDER — ALBUMIN HUMAN 25 % IV SOLN
12.5000 g | Freq: Three times a day (TID) | INTRAVENOUS | Status: AC
  Administered 2021-05-27 – 2021-05-30 (×9): 12.5 g via INTRAVENOUS
  Filled 2021-05-27 (×9): qty 50

## 2021-05-27 MED ORDER — K PHOS MONO-SOD PHOS DI & MONO 155-852-130 MG PO TABS
500.0000 mg | ORAL_TABLET | Freq: Two times a day (BID) | ORAL | Status: AC
  Administered 2021-05-27 (×2): 500 mg via ORAL
  Filled 2021-05-27 (×2): qty 2

## 2021-05-27 NOTE — Progress Notes (Signed)
OT Cancellation Note ? ?Patient Details ?Name: Logan Nelson ?MRN: [id number] ?DOB: [birth year] ? ? ?Cancelled Treatment:    Reason Eval/Treat Not Completed: Patient declined, no reason specified. Pt refused, stating that he already got up with PT, he was not getting up again. OT will follow as time allows.  ? ?Logan Nelson ?05/27/2021, 11:47 AM ?

## 2021-05-27 NOTE — Plan of Care (Signed)
Pt is alert oriented x 4. Pt is RA. Pt c/o generalized pain and pain to sacrum, prn pain medication given. Pt had two large, mushy, light brown, and tan colored stools. Dressing changed. No distress noted. Pt has generalized swelling. Pts extremities elevated but pt tends to move them around.  ? ? ?Problem: Education: ?Goal: Knowledge of General Education information will improve ?Description: Including pain rating scale, medication(s)/side effects and non-pharmacologic comfort measures ?Outcome: Progressing ?  ?Problem: Health Behavior/Discharge Planning: ?Goal: Ability to manage health-related needs will improve ?Outcome: Progressing ?  ?Problem: Clinical Measurements: ?Goal: Ability to maintain clinical measurements within normal limits will improve ?Outcome: Progressing ?Goal: Will remain free from infection ?Outcome: Progressing ?Goal: Diagnostic test results will improve ?Outcome: Progressing ?Goal: Respiratory complications will improve ?Outcome: Progressing ?Goal: Cardiovascular complication will be avoided ?Outcome: Progressing ?  ?Problem: Activity: ?Goal: Risk for activity intolerance will decrease ?Outcome: Progressing ?  ?Problem: Nutrition: ?Goal: Adequate nutrition will be maintained ?Outcome: Progressing ?  ?Problem: Coping: ?Goal: Level of anxiety will decrease ?Outcome: Progressing ?  ?Problem: Elimination: ?Goal: Will not experience complications related to bowel motility ?Outcome: Progressing ?Goal: Will not experience complications related to urinary retention ?Outcome: Progressing ?  ?Problem: Pain Managment: ?Goal: General experience of comfort will improve ?Outcome: Progressing ?  ?Problem: Safety: ?Goal: Ability to remain free from injury will improve ?Outcome: Progressing ?  ?Problem: Skin Integrity: ?Goal: Risk for impaired skin integrity will decrease ?Outcome: Progressing ?  ?

## 2021-05-27 NOTE — Consult Note (Addendum)
[place] Nurse wound follow up ?Patient receiving care in [facility] 3W26 ?Wound type: Unstageable on the coccyx ?Measurement: 4.5 cm x 4 cm x 3 cm ?Wound bed: most of the slough has come off. One piece of slough hanging that I was able to snip off with sterile suture removal scissors. 50% pink and 50% yellow grey ?Drainage (amount, consistency, odor) yellow tan on foam dressing. ?Periwound: intact ?Dressing procedure/placement/frequency: ?Continue with Aquacel Advantage secured with sacral foam dressing changing BID. If the wound becomes malodorous or worsens we will consider [employer] or [employer]. Secretary will order him an air mattress and pressure redistribution chair pad. ? ?Monitor the wound area(s) for worsening of condition such as: ?Signs/symptoms of infection, increase in size, development of or worsening of odor, ?development of pain, or increased pain at the affected locations.   ?Notify the medical team if any of these develop. ? ?Thank you for the consult. [place] nurse will not follow at this time.   ?Please re-consult the [place] team if needed. ? ?Cathlean [place]. Tamala Julian, MSN, RN, CMSRN, AGCNS, WTA ?Wound Treatment Associate ?Pager [phone]   ?  ?

## 2021-05-27 NOTE — Progress Notes (Addendum)
Physical Therapy Treatment ?Patient Details ?Name: Logan Nelson ?MRN: [id number] ?DOB: [birth year] ?Today's Date: 05/27/2021 ? ? ?History of Present Illness Pt is a 37 admitted 4/3 with quickly progressing B LE weakness.  Recently hospitalize with incarcerated abdominal hernia, was repaired, but pt left AMA before he was safe to mobilize,  MRI of brain, cervical/thoracic spine inconclusive.  Work up continues.  PMHx: anemia, arthritis, dyspnea, GERD, HTN, LE edema ? ?  ?PT Comments  ? ? Pt required min/mod assist bed mobility and min assist sit to stand with RW. Pt declining ambulation and sitting up in recliner. Assisted pt with positioning in R sidelying at end of session.  ?  ?Recommendations for follow up therapy are one component of a multi-disciplinary discharge planning process, led by the attending physician.  Recommendations may be updated based on patient status, additional functional criteria and insurance authorization. ? ?Follow Up Recommendations ? Skilled nursing-short term rehab (<3 hours/day) ?  ?  ?Assistance Recommended at Discharge Frequent or constant Supervision/Assistance  ?Patient can return home with the following Assistance with cooking/housework;Direct supervision/assist for medications management;Direct supervision/assist for financial management;Assist for transportation;Help with stairs or ramp for entrance;A little help with walking and/or transfers ?  ?Equipment Recommendations ? Rolling walker (2 wheels)  ?  ?Recommendations for Other Services   ? ? ?  ?Precautions / Restrictions Precautions ?Precautions: Fall ?Precaution Comments: bilat LE edema, R UE edema, distending abdomen  ?  ? ?Mobility ? Bed Mobility ?Overal bed mobility: Needs Assistance ?  ?  ?Sidelying to sit: Min assist ?  ?  ?Sit to sidelying: Mod assist ?General bed mobility comments: +rail, increased time ?  ? ?Transfers ?Overall transfer level: Needs assistance ?Equipment used: Rolling walker (2 wheels) ?Transfers:  Sit to/from Stand ?Sit to Stand: Min assist ?  ?  ?  ?  ?  ?General transfer comment: assist to power up, pt pulling up on RW (unwilling to correct with cues) ?  ? ?Ambulation/Gait ?  ?  ?  ?  ?  ?  ?  ?General Gait Details: Pt declining, stating "I'm waiting on the doctor to change that order. I want to walk outside with my uncle." ? ? ?Stairs ?  ?  ?  ?  ?  ? ? ?Wheelchair Mobility ?  ? ?Modified Rankin (Stroke Patients Only) ?  ? ? ?  ?Balance Overall balance assessment: Needs assistance ?Sitting-balance support: Feet supported, Bilateral upper extremity supported ?Sitting balance-Leahy Scale: Fair ?Sitting balance - Comments: unable to accept dynamic challenges ?  ?  ?  ?  ?  ?  ?  ?  ?  ?  ?  ?  ?  ?  ?  ?  ? ?  ?Cognition Arousal/Alertness: Awake/alert ?Behavior During Therapy: [facility] for tasks assessed/performed (mildly agitated/irritable) ?Overall Cognitive Status: No family/caregiver present to determine baseline cognitive functioning ?Area of Impairment: Problem solving, Safety/judgement, Awareness, Following commands ?  ?  ?  ?  ?  ?  ?  ?  ?  ?  ?  ?Following Commands: Follows one step commands inconsistently, Follows one step commands with increased time (behavioral) ?Safety/Judgement: Decreased awareness of safety, Decreased awareness of deficits ?Awareness: Emergent ?Problem Solving: Slow processing, Decreased initiation, Difficulty sequencing ?General Comments: pt can become irritated, demanding, does things his way, resists feedback ?  ?  ? ?  ?Exercises   ? ?  ?General Comments   ?  ?  ? ?Pertinent Vitals/Pain Pain Assessment ?Pain Assessment: Faces ?Faces  Pain Scale: Hurts a little bit ?Pain Location: generalized ?Pain Descriptors / Indicators: Grimacing ?Pain Intervention(s): Monitored during session, Repositioned  ? ? ?Home Living   ?  ?  ?  ?  ?  ?  ?  ?  ?  ?   ?  ?Prior Function    ?  ?  ?   ? ?PT Goals (current goals can now be found in the care plan section) Acute Rehab PT Goals ?Patient  Stated Goal: return to independence ?Progress towards PT goals: Progressing toward goals ? ?  ?Frequency ? ? ? Min 3X/week ? ? ? ?  ?PT Plan Current plan remains appropriate  ? ? ?Co-evaluation   ?  ?  ?  ?  ? ?  ?AM-PAC PT "6 Clicks" Mobility   ?Outcome Measure ? Help needed turning from your back to your side while in a flat bed without using bedrails?: A Little ?Help needed moving from lying on your back to sitting on the side of a flat bed without using bedrails?: A Little ?Help needed moving to and from a bed to a chair (including a wheelchair)?: A Lot ?Help needed standing up from a chair using your arms (e.g., wheelchair or bedside chair)?: A Lot ?Help needed to walk in hospital room?: A Lot ?Help needed climbing 3-5 steps with a railing? : Total ?6 Click Score: 13 ? ?  ?End of Session   ?Activity Tolerance: Other (comment) (self limiting) ?Patient left: in bed;with call bell/phone within reach;with bed alarm set ?Nurse Communication: Mobility status ?PT Visit Diagnosis: Other abnormalities of gait and mobility (R26.89);Muscle weakness (generalized) (M62.81);History of falling (Z91.81) ?  ? ? ?Time: [id number] ?PT Time Calculation (min) (ACUTE ONLY): 10 min ? ?Charges:  $Therapeutic Activity: 8-22 mins          ?          ? ?Lorrin Goodell, PT  ?Office # [phone] ?Pager [phone] ? ? ? ?Lorriane Shire ?05/27/2021, 9:48 AM ? ?

## 2021-05-27 NOTE — Progress Notes (Signed)
?PROGRESS NOTE ? ? ? ?Logan Nelson  [id number] DOB: [birth year] DOA: 05/10/2021 ?PCP: [name], MD  ? ?Brief Narrative:  ?Theodric Garbo is a 56 y.o. male with past medical history of severe protein calorie malnutrition, hypertension, anasarca, abdominal wall hernia presented to the hospital with bilateral lower extremity weakness.  Patient was recently hospitalized for abdominal hernia with incarceration and had a open primary repair but then had left AMA before he was placed in rehab.  Patient was admitted this time with ambulatory dysfunction and bilateral lower extremity weakness.  He is improving slowly but is unsafe discharge disposition as he is homeless and PT OT recommending SNF but currently no bed offers so cannot safely discharge him. ? ?Assessment and plan ? ?Principal Problem: ?  Lower extremity weakness ?Active Problems: ?  Hypertension ?  Protein-calorie malnutrition, severe ?  Hypokalemia ?  Anemia of chronic disease ?  GERD (gastroesophageal reflux disease) ?  Generalized weakness ?  Hypophosphatemia ?  Hypomagnesemia ?  Vitamin D deficiency ?  Pressure injury of buttock, stage 2 ([place]) ?  Pressure injury of sacral region, unstageable ([place]) ?  ? ?BLE weakness/RUE weakness/ambulatory dysfunction, deconditioning ?-Thought to be secondary to deconditioning and severe malnutrition.   ?-Seen by neurology.   ?-There was some enhancement on the MRI scan of the spine.  -Neurosurgery was consulted and this looks like nonspecific finding .  No signs of infection.   ?-Vitamin B1 level at 127.  Vitamin B12 at 573.   TSH at 2.975  -Vitamin D level very low at 19.7.   ?-Seen by physical therapy with recommendation of SNF placement. ?-U/A Uremarkable ?-C/w Vitamin D 50,000 units every 7 days ?-TOC for SNF placement for safe discharge disposition however he is a difficult placement given no insurance and homelessness ? ?Severe protein-calorie malnutrition with mild hyperglycemia. ?Hypoalbuminemia   ?-Present on admission.   ?-Nutrition Status: ?Nutrition Problem: Severe Malnutrition ?Etiology: chronic illness ?Signs/Symptoms: severe fat depletion, severe muscle depletion, percent weight loss (17.9% weight loss in less than 7 months) ?Percent weight loss: 17.9 % ?Interventions: MVI, Boost Breeze, Refer to RD note for recommendations, Other (Comment), Magic cup (vitamin D, double protein) ?-Continue supplementation.  Albumin now <1.5 x2   ?-Will initiated Albumin 12.5 mg q8h x 1 Day  ?-Will encourage oral nutrition.  Continue Ensure supplements. ?  ?Anasarca ?-Secondary to severe malnutrition and low albumin.   ?-Encouraged oral intake and C/w Juven 1 packet po BID. ?-Trying some Albumin and will consider some Lasix ?  ?S/p open primary repair of chronically incarcerated ventral incisional hernia, primary right inguinal hernia repair without mesh, extensive lysis of adhesions on 04/30/2021. ?-Leukocytosis has resolved and now is 10.1 ?-Continue dressing changes ? ?Diarrhea. ?-Negative C. difficile screen.   ?-GI pathogen panel was negative. Could be bile acid associated diarrhea.  He states that his diarrhea started after cholecystectomy.      ?-C/w Lomotil, loperamide as needed  ?-C/w Cholestyramine 4 g p.o. twice daily  ? ?HTN ?-Borderline low with last BP being 120/86 ?-Not on any medications.   ?-Continue to monitor BP's per Protocol  ? ?Anemia of chronic disease ?-Hgb/Hct slowly trending down and went from 10.4/33.0 -> 9.8/31.2 -> 9.3/28.4 -> 9.2/27.7 -> 8.5/25.4 -> 8.2/24.2 ?-Check Anemia Panel in the AM ?-Continue to Monitor for S/Sx of Bleeding; No overt bleeding noted ?-Repeat CBC in the AM  ? ?Metabolic Acidosis ?-Mild. CO2 is 19, AG is 4, and Chloride Level is now 112 ?-Continue to  Monitor and Trend ?-Repeat CMP in the AM  ?    ?GERD ?-C/w PPI with Pantoprazole 40 mg po Daily  ?  ?Hypokalemia ?-Replete during hospitalization.  Potassium 3.8 this morning. ?-Continue monitor electrolytes and replete as  necessary ?-Repeat CMP in the AM ? ?Hypocalcemia ?-Patient's Ca2+ was 7.0 this AM ?-Corrected for an Albumin of <1.5 it was 9.1 ?-Replete with IV Calcium Gluconate 1 gram yesterday  ?-Continue to Monitor and Replete as Necessary ? ?Leukocytosis ?-Likely Reactive and Improving ?-WBC went from 8.8 -> 13.7 -> 13.0 -> 10.3 -> 10.3 -> 10.1 ?-Continue to Monitor and Trend as Necessary ?-Repeat CBC in the AM  ? ?Hypophosphatemia.   ?-Phosphorus of 2.3 today. ?-Replete with po K Phos Neutral 500 mg po BID x2 ?-Continue to Monitor and Replete as Necessary ?-Repeat Phos Level in the AM  ? ?Vitamin D Deficiency  ?-Vitamin D level at 19.79.   ?-Continue 50,000 unit vitamin D q. weekly  ? ?Hypomagnesemia.   ?-Mag was 1.8 ?-Replete with IV Mag Sulfate 2 grams again ?-Continue to Monitor and Replete as necessary  ?-Repeat Mag Level in the AM ? ?Coccyx Wound and Pressure Ulcer ?Pressure Injury 05/04/21 Buttocks Left Stage 2 -  Partial thickness loss of dermis presenting as a shallow open injury with a red, pink wound bed without slough. small dime sized skin tear (Active)  ?05/04/21 0800  ?Location: Buttocks  ?Location Orientation: Left  ?Staging: Stage 2 -  Partial thickness loss of dermis presenting as a shallow open injury with a red, pink wound bed without slough.  ?Wound Description (Comments): small dime sized skin tear  ?Present on Admission: No  ?-[place] consulted and recommending Continue with Aquacel Advantage secured with sacral foam dressing changing BID. If the wound becomes malodorous or worsens we will consider MediHoney or Dakin's. Secretary will order him an air mattress and pressure redistribution chair pad. ?-They are also recommending  "Monitor the wound area(s) for worsening of condition such as: Signs/symptoms of infection,  increase in size, development of or worsening of odor, development of pain, or increased pain at the affected locations." ?   ? ?Assessment and Plan: ?Pressure injury of sacral region,  unstageable ([place]) ?Clean the sacral area with no rinse cleanser. Apply a cut to fit piece of Aquacel Advantage over the wound and secure with a sacral foam dressing with tip up to help prevent soiling of the wound. Change Aquacel BID. ? ?DVT prophylaxis: Place TED hose Start: 05/21/21 1624 ?SCDs Start: 05/11/21 2009 ? ?  Code Status: Full Code ?Family Communication: No family present at bedside  ? ?Disposition Plan:  ?Level of care: Telemetry Medical ?Status is: Inpatient ?Remains inpatient appropriate because: Has an unsafe Discharge Disposition and has No insurance and needs SNF  ?  ?Consultants:  ?[facility] Nurse ?Neurology ?Neurosurgery ?PCCM last admission  ? ?Procedures:  ?None ? ?Antimicrobials:  ?Anti-infectives (From admission, onward)  ? ? None  ? ?  ?  ?Subjective: ?Seen and examined at bedside who is very tearful today and states that his uncle cannot help him find a place.  Understands that he may be here a while requesting to go outside with the nursing staff.  Denies chest pain or shortness of breath.  No lightheadedness or dizziness but continues have significant leg swelling.  Denies any other concerns or complaints at this time.  And still awaiting SNF but very difficult to place per TOC ? ?Objective: ?Vitals:  ? 05/26/21 2322 05/27/21 0315 05/27/21 [id number]  05/27/21 1147  ?BP: 121/75 103/73 132/86 120/86  ?Pulse: 73 62 69 85  ?Resp:  18 16 14   ?Temp: 98.6 ?F (37 ?C) 98.3 ?F (36.8 ?C) 98.6 ?F (37 ?C) 98.7 ?F (37.1 ?C)  ?TempSrc: Oral Oral Oral Oral  ?SpO2: 100% 100% 100% 100%  ?Weight:      ?Height:      ? ? ?Intake/Output Summary (Last 24 hours) at 05/27/2021 1503 ?Last data filed at 05/27/2021 0500 ?Gross per 24 hour  ?Intake 120 ml  ?Output 300 ml  ?Net -180 ml  ? ?Filed Weights  ? 05/13/21 1600  ?Weight: 65 kg  ? ?Examination: ?Physical Exam: ? ?Constitutional: Thin cachectic chronically ill-appearing African-American male who appears older than his stated age appears in no acute distress but became very  tearful after further conversation ?Respiratory: Diminished to auscultation bilaterally with coarse breath sounds, no wheezing, rales, rhonchi or crackles. Normal respiratory effort and patient is not

## 2021-05-28 LAB — CBC WITH DIFFERENTIAL/PLATELET
Abs Immature Granulocytes: 0.04 10*3/uL (ref 0.00–0.07)
Basophils Absolute: 0 10*3/uL (ref 0.0–0.1)
Basophils Relative: 0 %
Eosinophils Absolute: 0 10*3/uL (ref 0.0–0.5)
Eosinophils Relative: 0 %
HCT: 26.5 % — ABNORMAL LOW (ref 39.0–52.0)
Hemoglobin: 8.5 g/dL — ABNORMAL LOW (ref 13.0–17.0)
Immature Granulocytes: 0 %
Lymphocytes Relative: 8 %
Lymphs Abs: 1 10*3/uL (ref 0.7–4.0)
MCH: 30.2 pg (ref 26.0–34.0)
MCHC: 32.1 g/dL (ref 30.0–36.0)
MCV: 94.3 fL (ref 80.0–100.0)
Monocytes Absolute: 1.4 10*3/uL — ABNORMAL HIGH (ref 0.1–1.0)
Monocytes Relative: 12 %
Neutro Abs: 9.7 10*3/uL — ABNORMAL HIGH (ref 1.7–7.7)
Neutrophils Relative %: 80 %
Platelets: 364 10*3/uL (ref 150–400)
RBC: 2.81 MIL/uL — ABNORMAL LOW (ref 4.22–5.81)
RDW: 15.5 % (ref 11.5–15.5)
WBC: 12.2 10*3/uL — ABNORMAL HIGH (ref 4.0–10.5)
nRBC: 0 % (ref 0.0–0.2)

## 2021-05-28 LAB — COMPREHENSIVE METABOLIC PANEL
ALT: 14 U/L (ref 0–44)
AST: 15 U/L (ref 15–41)
Albumin: 1.5 g/dL — ABNORMAL LOW (ref 3.5–5.0)
Alkaline Phosphatase: 87 U/L (ref 38–126)
Anion gap: 4 — ABNORMAL LOW (ref 5–15)
BUN: 26 mg/dL — ABNORMAL HIGH (ref 6–20)
CO2: 18 mmol/L — ABNORMAL LOW (ref 22–32)
Calcium: 7.2 mg/dL — ABNORMAL LOW (ref 8.9–10.3)
Chloride: 116 mmol/L — ABNORMAL HIGH (ref 98–111)
Creatinine, Ser: 0.53 mg/dL — ABNORMAL LOW (ref 0.61–1.24)
GFR, Estimated: >60 mL/min (ref >60)
Glucose, Bld: 89 mg/dL (ref 70–99)
Potassium: 3.9 mmol/L (ref 3.5–5.1)
Sodium: 138 mmol/L (ref 135–145)
Total Bilirubin: 0.5 mg/dL (ref 0.3–1.2)
Total Protein: 4.6 g/dL — ABNORMAL LOW (ref 6.5–8.1)

## 2021-05-28 LAB — PHOSPHORUS: Phosphorus: 2.7 mg/dL (ref 2.5–4.6)

## 2021-05-28 LAB — MAGNESIUM: Magnesium: 1.7 mg/dL (ref 1.7–2.4)

## 2021-05-28 MED ORDER — FUROSEMIDE 10 MG/ML IJ SOLN
40.0000 mg | Freq: Once | INTRAMUSCULAR | Status: AC
  Administered 2021-05-28: 40 mg via INTRAVENOUS
  Filled 2021-05-28: qty 4

## 2021-05-28 NOTE — Plan of Care (Signed)
  Problem: Pain Managment: Goal: General experience of comfort will improve Outcome: Progressing   Problem: Safety: Goal: Ability to remain free from injury will improve Outcome: Progressing   Problem: Skin Integrity: Goal: Risk for impaired skin integrity will decrease Outcome: Progressing   

## 2021-05-28 NOTE — Progress Notes (Signed)
Occupational Therapy Treatment ?Patient Details ?Name: Logan Nelson ?MRN: [id number] ?DOB: 06/18/1965 ?Today's Date: 05/28/2021 ? ? ?History of present illness Pt is a 27 admitted 4/3 with quickly progressing B LE weakness.  Recently hospitalize with incarcerated abdominal hernia, was repaired, but pt left AMA before he was safe to mobilize,  MRI of brain, cervical/thoracic spine inconclusive.  Work up continues.  PMHx: anemia, arthritis, dyspnea, GERD, HTN, LE edema ?  ?OT comments ? Pt making limited progress with OT goals, due to refusing OOB mobility. This session pt required total assist for LB bathing and dressing s/p BM. Pt refused to sit EOB or get out of bed to assist with bathing. Continuing to recommend SNF to maximize independence. OT will follow acutely to address ADL participation and progress with independence.   ? ?Recommendations for follow up therapy are one component of a multi-disciplinary discharge planning process, led by the attending physician.  Recommendations may be updated based on patient status, additional functional criteria and insurance authorization. ?   ?Follow Up Recommendations ? Skilled nursing-short term rehab (<3 hours/day)  ?  ?Assistance Recommended at Discharge Frequent or constant Supervision/Assistance  ?Patient can return home with the following ? A lot of help with walking and/or transfers;A lot of help with bathing/dressing/bathroom;Assist for transportation;Assistance with cooking/housework ?  ?Equipment Recommendations ? BSC/3in1;Tub/shower bench  ?  ?Recommendations for Other Services   ? ?  ?Precautions / Restrictions Precautions ?Precautions: Fall ?Precaution Comments: bilat LE edema, R UE edema, distending abdomen ?Restrictions ?Weight Bearing Restrictions: No  ? ? ?  ? ?Mobility Bed Mobility ?Overal bed mobility: Needs Assistance ?Bed Mobility: Rolling ?Rolling: Independent ?  ?  ?  ?  ?General bed mobility comments: Pt declined sitting up EOB ?   ? ?Transfers ?  ?  ?  ?  ?  ?  ?  ?  ?  ?General transfer comment: Pt reffused OOB ?  ?  ?Balance   ?  ?  ?  ?  ?  ?  ?  ?  ?  ?  ?  ?  ?  ?  ?  ?  ?  ?  ?   ? ?ADL either performed or assessed with clinical judgement  ? ?ADL Overall ADL's : Needs assistance/impaired ?  ?  ?  ?  ?Upper Body Bathing: Minimal assistance;Bed level ?Upper Body Bathing Details (indicate cue type and reason): completed in bed ?Lower Body Bathing: Total assistance;Bed level ?Lower Body Bathing Details (indicate cue type and reason): completed in bed, unable to assist at all ?Upper Body Dressing : Minimal assistance;Sitting;Bed level ?Upper Body Dressing Details (indicate cue type and reason): donning hospital gown ?Lower Body Dressing: Total assistance;Bed level ?Lower Body Dressing Details (indicate cue type and reason): Unable to doff depends or don socks this session ?  ?  ?  ?  ?  ?  ?  ?General ADL Comments: Pt declined all OOB mobility ?  ? ?Extremity/Trunk Assessment   ?  ?  ?  ?  ?  ? ?Vision   ?  ?  ?Perception   ?  ?Praxis   ?  ? ?Cognition Arousal/Alertness: Awake/alert ?Behavior During Therapy: [facility] for tasks assessed/performed ?Overall Cognitive Status: No family/caregiver present to determine baseline cognitive functioning ?  ?  ?  ?  ?  ?  ?  ?  ?  ?  ?  ?  ?  ?  ?  ?  ?General Comments: pt can become irritated,  demanding, does things his way, resists feedback ?  ?  ?   ?Exercises   ? ?  ?Shoulder Instructions   ? ? ?  ?General Comments Pt required total clean up in bed s/p BM. Sacral pad removed. RN notified  ? ? ?Pertinent Vitals/ Pain       Pain Assessment ?Pain Assessment: No/denies pain ? ?Home Living   ?  ?  ?  ?  ?  ?  ?  ?  ?  ?  ?  ?  ?  ?  ?  ?  ?  ?  ? ?  ?Prior Functioning/Environment    ?  ?  ?  ?   ? ?Frequency ? Min 2X/week  ? ? ? ? ?  ?Progress Toward Goals ? ?OT Goals(current goals can now be found in the care plan section) ? Progress towards OT goals: Not progressing toward goals - comment (pt refusing OOB  mobility) ? ?Acute Rehab OT Goals ?Patient Stated Goal: To go outside ?OT Goal Formulation: With patient ?Time For Goal Achievement: 06/11/21 ?Potential to Achieve Goals: Fair ?ADL Goals ?Pt Will Perform Grooming: with supervision;standing ?Pt Will Perform Lower Body Bathing: with min guard assist;sitting/lateral leans;sit to/from stand ?Pt Will Perform Upper Body Dressing: with supervision;sitting ?Pt Will Perform Lower Body Dressing: with min guard assist;sitting/lateral leans;sit to/from stand ?Pt Will Transfer to Toilet: with min assist;stand pivot transfer ?Pt Will Perform Toileting - Clothing Manipulation and hygiene: with min guard assist;sitting/lateral leans;sit to/from stand ?Pt/caregiver will Perform Home Exercise Program: Increased strength;Both right and left upper extremity;With theraband;With Supervision;With written HEP provided ?Additional ADL Goal #1: Pt will tolerate 3 mins of dynamic standing task with min guard as a precursor for safe ADL performance.  ?Plan Discharge plan remains appropriate   ? ?Co-evaluation ? ? ?   ?  ?  ?  ?  ? ?  ?AM-PAC OT "6 Clicks" Daily Activity     ?Outcome Measure ? ? Help from another person eating meals?: None ?Help from another person taking care of personal grooming?: A Little ?Help from another person toileting, which includes using toliet, bedpan, or urinal?: A Lot ?Help from another person bathing (including washing, rinsing, drying)?: A Lot ?Help from another person to put on and taking off regular upper body clothing?: A Little ?Help from another person to put on and taking off regular lower body clothing?: Total ?6 Click Score: 15 ? ?  ?End of Session   ? ?OT Visit Diagnosis: Unsteadiness on feet (R26.81);Other abnormalities of gait and mobility (R26.89);Muscle weakness (generalized) (M62.81);History of falling (Z91.81) ?Hemiplegia - Right/Left: Right ?Hemiplegia - dominant/non-dominant: Dominant ?Hemiplegia - caused by: Unspecified ?  ?Activity Tolerance  Patient tolerated treatment well ?  ?Patient Left in bed;with call bell/phone within reach ?  ?Nurse Communication Mobility status ?  ? ?   ? ?Time: [id number] ?OT Time Calculation (min): 30 min ? ?Charges: OT General Charges ?$OT Visit: 1 Visit ?OT Treatments ?$Self Care/Home Management : 23-37 mins ? ?Ingri Diemer H., OTR/L ?Acute Rehabilitation ? ?Margo Lama Elane Yolanda Bonine ?05/28/2021, 5:09 PM ?

## 2021-05-28 NOTE — Progress Notes (Signed)
?PROGRESS NOTE ? ? ? ?Logan Nelson  [id number] DOB: October 07, 1965 DOA: 05/10/2021 ?PCP: [name], MD  ? ?Brief Narrative:  ?Logan Nelson is a 56 y.o. male with past medical history of severe protein calorie malnutrition, hypertension, anasarca, abdominal wall hernia presented to the hospital with bilateral lower extremity weakness.  Patient was recently hospitalized for abdominal hernia with incarceration and had a open primary repair but then had left AMA before he was placed in rehab.  Patient was admitted this time with ambulatory dysfunction and bilateral lower extremity weakness.  He is improving slowly but is unsafe discharge disposition as he is homeless and PT OT recommending SNF but currently no bed offers so cannot safely discharge him. ? ?Given his lower extremity swelling and anasarca we will start him on albumin 12.5 g every 8 for 3 days and also give him a dose of IV Lasix and recommend elevation of his extremities and TED hose. ? ?Assessment and plan ? ?Principal Problem: ?  Lower extremity weakness ?Active Problems: ?  Hypertension ?  Protein-calorie malnutrition, severe ?  Hypokalemia ?  Anemia of chronic disease ?  GERD (gastroesophageal reflux disease) ?  Generalized weakness ?  Hypophosphatemia ?  Hypomagnesemia ?  Vitamin D deficiency ?  Pressure injury of buttock, stage 2 ([place]) ?  Pressure injury of sacral region, unstageable ([place]) ?  ? ?BLE weakness/RUE weakness/ambulatory dysfunction, deconditioning ?-Thought to be secondary to deconditioning and severe malnutrition.   ?-Seen by neurology.   ?-There was some enhancement on the MRI scan of the spine.  -Neurosurgery was consulted and this looks like nonspecific finding .  No signs of infection.   ?-Vitamin B1 level at 127.  Vitamin B12 at 573.   TSH at 2.975  -Vitamin D level very low at 19.7.   ?-Seen by physical therapy with recommendation of SNF placement. ?-U/A Uremarkable ?-C/w Vitamin D 50,000 units every 7 days ?-TOC  for SNF placement for safe discharge disposition however he is a difficult placement given no insurance and homelessness ? ?Severe protein-calorie malnutrition with mild hyperglycemia. ?Hypoalbuminemia  ?-Present on admission.   ?-Nutrition Status: ?Nutrition Problem: Severe Malnutrition ?Etiology: chronic illness ?Signs/Symptoms: severe fat depletion, severe muscle depletion, percent weight loss (17.9% weight loss in less than 7 months) ?Percent weight loss: 17.9 % ?Interventions: MVI, Boost Breeze, Refer to RD note for recommendations, Other (Comment), Magic cup (vitamin D, double protein) ?-Continue supplementation.  Albumin now <1.5 x2 and repeat Albumin pending   ?-Will initiated Albumin 12.5 mg q8h x 3 Day (Day 2) ?-Will encourage oral nutrition.  Continue Ensure supplements. ?  ?Anasarca ?-Secondary to severe malnutrition and low albumin.   ?-Encouraged oral intake and C/w Juven 1 packet po BID. ?-Trying some Albumin as above and will give IV Lasix 40 mg x1 ?  ?S/p open primary repair of chronically incarcerated ventral incisional hernia, primary right inguinal hernia repair without mesh, extensive lysis of adhesions on 04/30/2021. ?-Leukocytosis had resolved and now is 10.1 yesterday and today it is mildly elevated at 12.2 ?-Continue dressing changes ? ?Diarrhea. ?-Negative C. difficile screen.   ?-GI pathogen panel was negative. Could be bile acid associated diarrhea.  He states that his diarrhea started after cholecystectomy.      ?-C/w Lomotil, loperamide as needed  ?-C/w Cholestyramine 4 g p.o. twice daily  ? ?HTN ?-Borderline low with last BP being 108/79 ?-Not on any medications.   ?-Continue to monitor BP's per Protocol  ? ?Anemia of chronic disease ?-Hgb/Hct  slowly trending down and went from 10.4/33.0 -> 9.8/31.2 -> 9.3/28.4 -> 9.2/27.7 -> 8.5/25.4 -> 8.2/24.2 -> 8.5/26.5 ?-Check Anemia Panel in the AM ?-Continue to Monitor for S/Sx of Bleeding; No overt bleeding noted ?-Repeat CBC in the AM   ? ?Metabolic Acidosis ?-Mild. CO2 is 19, AG is 4, and Chloride Level is now 112 yesterday and repeat is pending  ?-Continue to Monitor and Trend ?-Repeat CMP in the AM  ?    ?GERD ?-C/w PPI with Pantoprazole 40 mg po Daily  ?  ?Hypokalemia ?-Replete during hospitalization.  Potassium 3.8 yesterday morning with this AM pending  ?-Continue monitor electrolytes and replete as necessary ?-Repeat CMP in the AM ? ?Hypocalcemia ?-Patient's Ca2+ was 7.0 this AM ?-Corrected for an Albumin of <1.5 it was 9.1 ?-Replete with IV Calcium Gluconate 1 gram yesterday  ?-Continue to Monitor and Replete as Necessary ? ?Leukocytosis ?-Likely Reactive and was Improving but slightly bumped ?-WBC went from 8.8 -> 13.7 -> 13.0 -> 10.3 -> 10.3 -> 10.1 -> 12.2 ?-Continue to Monitor and Trend as Necessary ?-Repeat CBC in the AM  ? ?Hypophosphatemia.   ?-Phosphorus of 2.3 yesterday with repeat pending . ?-Replete with po K Phos Neutral 500 mg po BID x2 yesterday ?-Continue to Monitor and Replete as Necessary ?-Repeat Phos Level in the AM  ? ?Vitamin D Deficiency  ?-Vitamin D level at 19.79.   ?-Continue 50,000 unit vitamin D q. weekly  ? ?Hypomagnesemia.   ?-Mag was 1.8 and repeat is pending  ?-Replete with IV Mag Sulfate 2 grams yesterday  ?-Continue to Monitor and Replete as necessary  ?-Repeat Mag Level in the AM ? ?Coccyx Wound and Pressure Ulcer ?Pressure Injury 05/04/21 Buttocks Left Stage 2 -  Partial thickness loss of dermis presenting as a shallow open injury with a red, pink wound bed without slough. small dime sized skin tear ([place])  ?05/04/21 0800  ?Location: Buttocks  ?Location Orientation: Left  ?Staging: Stage 2 -  Partial thickness loss of dermis presenting as a shallow open injury with a red, pink wound bed without slough.  ?Wound Description (Comments): small dime sized skin tear  ?Present on Admission: No  ?-[place] consulted and recommending Continue with Aquacel Advantage secured with sacral foam dressing changing  BID. If the wound becomes malodorous or worsens we will consider MediHoney or Dakin's. Secretary will order him an air mattress and pressure redistribution chair pad. ?-They are also recommending  "Monitor the wound area(s) for worsening of condition such as: Signs/symptoms of infection,  increase in size, development of or worsening of odor, development of pain, or increased pain at the affected locations." ?   ?Assessment and Plan: ?Pressure injury of sacral region, unstageable ([place]) ?Clean the sacral area with no rinse cleanser. Apply a cut to fit piece of Aquacel Advantage over the wound and secure with a sacral foam dressing with tip up to help prevent soiling of the wound. Change Aquacel BID. ? ? ?DVT prophylaxis: Place TED hose Start: 05/28/21 0949 ?Place TED hose Start: 05/21/21 1624 ?SCDs Start: 05/11/21 2009 ? ?  Code Status: Full Code ?Family Communication: No family present at bedside  ? ?Disposition Plan:  ?Level of care: Telemetry Medical ?Status is: Inpatient ?Remains inpatient appropriate because: Has an unsafe Discharge Disposition and has No insurance and needs SNF  ?  ?Consultants:  ?[place] Nurse ?Neurology ?Neurosurgery ?PCCM last admission ? ?Procedures:  ?None ? ?Antimicrobials:  ?Anti-infectives (From admission, onward)  ? ? None  ? ?  ?  ?  Subjective: ?Seen and examined at bedside and he states he is feeling better today and was happy that he was able to go outside yesterday.  Denied chest pain or shortness of breath.  Continues as leg swelling and thinks it went down a little bit yesterday with back up today.  No nausea or vomiting.  Denies any lightheadedness or dizziness.  No other concerns or complaints at this time. ? ?Objective: ?Vitals:  ? 05/27/21 1950 05/27/21 2327 05/28/21 0324 05/28/21 0756  ?BP: 97/76 100/62 101/71 108/79  ?Pulse: 82 71 70 98  ?Resp: 17 16 17 16   ?Temp: 98.1 ?F (36.7 ?C) 98.3 ?F (36.8 ?C) 98.6 ?F (37 ?C) 98.3 ?F (36.8 ?C)  ?TempSrc: Oral  Oral Oral  ?SpO2: 100% 99%  100% 100%  ?Weight:      ?Height:      ? ? ?Intake/Output Summary (Last 24 hours) at 05/28/2021 1137 ?Last data filed at 05/27/2021 1900 ?Gross per 24 hour  ?Intake 720 ml  ?Output 400 ml  ?Net 320 ml  ? ?Filed Weigh

## 2021-05-29 LAB — COMPREHENSIVE METABOLIC PANEL
ALT: 15 U/L (ref 0–44)
AST: 14 U/L — ABNORMAL LOW (ref 15–41)
Albumin: 1.5 g/dL — ABNORMAL LOW (ref 3.5–5.0)
Alkaline Phosphatase: 80 U/L (ref 38–126)
Anion gap: 5 (ref 5–15)
BUN: 28 mg/dL — ABNORMAL HIGH (ref 6–20)
CO2: 22 mmol/L (ref 22–32)
Calcium: 7.3 mg/dL — ABNORMAL LOW (ref 8.9–10.3)
Chloride: 111 mmol/L (ref 98–111)
Creatinine, Ser: 0.49 mg/dL — ABNORMAL LOW (ref 0.61–1.24)
GFR, Estimated: >60 mL/min (ref >60)
Glucose, Bld: 85 mg/dL (ref 70–99)
Potassium: 3.9 mmol/L (ref 3.5–5.1)
Sodium: 138 mmol/L (ref 135–145)
Total Bilirubin: 0.6 mg/dL (ref 0.3–1.2)
Total Protein: 4.2 g/dL — ABNORMAL LOW (ref 6.5–8.1)

## 2021-05-29 LAB — CBC WITH DIFFERENTIAL/PLATELET
Abs Immature Granulocytes: 0.05 10*3/uL (ref 0.00–0.07)
Basophils Absolute: 0 10*3/uL (ref 0.0–0.1)
Basophils Relative: 0 %
Eosinophils Absolute: 0.1 10*3/uL (ref 0.0–0.5)
Eosinophils Relative: 1 %
HCT: 21.8 % — ABNORMAL LOW (ref 39.0–52.0)
Hemoglobin: 7.3 g/dL — ABNORMAL LOW (ref 13.0–17.0)
Immature Granulocytes: 0 %
Lymphocytes Relative: 10 %
Lymphs Abs: 1.2 10*3/uL (ref 0.7–4.0)
MCH: 30.8 pg (ref 26.0–34.0)
MCHC: 33.5 g/dL (ref 30.0–36.0)
MCV: 92 fL (ref 80.0–100.0)
Monocytes Absolute: 1.2 10*3/uL — ABNORMAL HIGH (ref 0.1–1.0)
Monocytes Relative: 10 %
Neutro Abs: 9.6 10*3/uL — ABNORMAL HIGH (ref 1.7–7.7)
Neutrophils Relative %: 79 %
Platelets: 347 10*3/uL (ref 150–400)
RBC: 2.37 MIL/uL — ABNORMAL LOW (ref 4.22–5.81)
RDW: 15.7 % — ABNORMAL HIGH (ref 11.5–15.5)
WBC: 12.2 10*3/uL — ABNORMAL HIGH (ref 4.0–10.5)
nRBC: 0 % (ref 0.0–0.2)

## 2021-05-29 LAB — MAGNESIUM: Magnesium: 1.7 mg/dL (ref 1.7–2.4)

## 2021-05-29 LAB — PHOSPHORUS: Phosphorus: 2.7 mg/dL (ref 2.5–4.6)

## 2021-05-29 MED ORDER — FUROSEMIDE 10 MG/ML IJ SOLN
40.0000 mg | Freq: Once | INTRAMUSCULAR | Status: AC
  Administered 2021-05-29: 40 mg via INTRAVENOUS
  Filled 2021-05-29: qty 4

## 2021-05-29 MED ORDER — MAGNESIUM SULFATE 2 GM/50ML IV SOLN
2.0000 g | Freq: Once | INTRAVENOUS | Status: AC
  Administered 2021-05-29: 2 g via INTRAVENOUS
  Filled 2021-05-29: qty 50

## 2021-05-29 NOTE — Progress Notes (Signed)
?PROGRESS NOTE ? ? ? ?Logan Nelson  [id number] DOB: November 23, 1965 DOA: 05/10/2021 ?PCP: [name], MD  ? ?Brief Narrative:  ?Logan Nelson is a 56 y.o. male with past medical history of severe protein calorie malnutrition, hypertension, anasarca, abdominal wall hernia presented to the hospital with bilateral lower extremity weakness.  Patient was recently hospitalized for abdominal hernia with incarceration and had a open primary repair but then had left AMA before he was placed in rehab.  Patient was admitted this time with ambulatory dysfunction and bilateral lower extremity weakness.  He is improving slowly but is unsafe discharge disposition as he is homeless and PT OT recommending SNF but currently no bed offers so cannot safely discharge him. ? ?Given his lower extremity swelling and anasarca we will start him on albumin 12.5 g every 8 for 3 days and also give him a dose of IV Lasix and recommend elevation of his extremities and TED hose. Will repeat a Dose of Lasix today given that he continues to have significant LE Edema.  ? ?Blood Count dropped some so will get FOBT. Patient very upset that staff didn't have the time to take him outside yesterday and threatening to leave AMA again if he is not taken outside.  ? ?Assessment and plan ? ?Principal Problem: ?  Lower extremity weakness ?Active Problems: ?  Hypertension ?  Protein-calorie malnutrition, severe ?  Hypokalemia ?  Anemia of chronic disease ?  GERD (gastroesophageal reflux disease) ?  Generalized weakness ?  Hypophosphatemia ?  Hypomagnesemia ?  Vitamin D deficiency ?  Pressure injury of buttock, stage 2 ([place]) ?  Pressure injury of sacral region, unstageable ([name]) ?  ? ?BLE weakness/RUE weakness/ambulatory dysfunction, deconditioning ?-Thought to be secondary to deconditioning and severe malnutrition.   ?-Seen by neurology.   ?-There was some enhancement on the MRI scan of the spine.  -Neurosurgery was consulted and this looks like  nonspecific finding .  No signs of infection.   ?-Vitamin B1 level at 127.  Vitamin B12 at 573.   TSH at 2.975  -Vitamin D level very low at 19.7.   ?-Seen by physical therapy with recommendation of SNF placement. ?-U/A Uremarkable ?-C/w Vitamin D 50,000 units every 7 days ?-TOC for SNF placement for safe discharge disposition however he is a difficult placement given no insurance and homelessness ? ?Severe protein-calorie malnutrition with mild hyperglycemia. ?Hypoalbuminemia  ?-Present on admission.   ?-Nutrition Status: ?Nutrition Problem: Severe Malnutrition ?Etiology: chronic illness ?Signs/Symptoms: severe fat depletion, severe muscle depletion, percent weight loss (17.9% weight loss in less than 7 months) ?Percent weight loss: 17.9 % ?Interventions: MVI, Boost Breeze, Refer to RD note for recommendations, Other (Comment), Magic cup (vitamin D, double protein) ?-Continue supplementation.  Albumin now <1.5 x2 and repeat Albumin being 1.5 x2 ?-Will initiated Albumin 12.5 mg q8h x 3 Day (Day 3) ?-Will encourage oral nutrition.  Continue Ensure supplements. ?  ?Anasarca ?-Likely Secondary to severe malnutrition and low albumin.   ?-Encouraged oral intake and C/w Juven 1 packet po BID. ?-Trying some Albumin as above and will give IV Lasix 40 mg x1 again ?-ECHO done last hospitalization showed EF of 60-65% and Normal Left Ventricular Diastolic Parameters ?-TSH was 2.975 ?-Initial Urinalysis done on 05/10/21 done and showed no Proteinuria ?-Liver U/S if doesn't improve  ?  ?S/p open primary repair of chronically incarcerated ventral incisional hernia, primary right inguinal hernia repair without mesh, extensive lysis of adhesions on 04/30/2021. ?-Leukocytosis had resolved and now is  10.1 the day before yesterday and again it is mildly elevated at 12.2 x2 ?-Continue dressing changes ? ?Diarrhea. ?-Negative C. difficile screen.   ?-GI pathogen panel was negative. Could be bile acid associated diarrhea.  He states that his  diarrhea started after cholecystectomy.      ?-C/w Lomotil, loperamide as needed  ?-C/w Cholestyramine 4 g p.o. twice daily  ? ?HTN ?-Borderline low with last BP being 127/68 ?-Not on any medications.   ?-Continue to monitor BP's per Protocol  ? ?Anemia of chronic disease ?-Hgb/Hct slowly trending down and went from 10.4/33.0 -> 9.8/31.2 -> 9.3/28.4 -> 9.2/27.7 -> 8.5/25.4 -> 8.2/24.2 -> 8.5/26.5 -> 7.3/21.8 ?-Check FOBT given that Hgb/Hct continues to Drop ?-May get CT Abd/Pelvis to evaluate for ? Retroperitoneal Hematoma ?-Anemia Panel Done last Hospitalization showed Iron level of 14, Ferritin Level of 831, and Folate of 33.3 ?-Continue to Monitor for S/Sx of Bleeding; No overt bleeding noted ?-Repeat CBC in the AM  ? ?Metabolic Acidosis ?-Mild and improved ?-CO2 is now 22, AG is 5, Chloride Level is now 111 ?-Continue to Monitor and Trend ?-Repeat CMP in the AM  ?    ?GERD ?-C/w PPI with Pantoprazole 40 mg po Daily  ?  ?Hypokalemia ?-Replete during hospitalization.  Potassium 3.8 yesterday morning with this AM pending  ?-Continue monitor electrolytes and replete as necessary ?-Repeat CMP in the AM ? ?Hypocalcemia ?-Patient's Ca2+ was 7.3 this AM ?-Continue to Monitor and Replete as Necessary ? ?Leukocytosis ?-Likely Reactive and was Improving but slightly bumped ?-WBC went from 8.8 -> 13.7 -> 13.0 -> 10.3 -> 10.3 -> 10.1 -> 12.2 x2 ?-Continue to Monitor and Trend as Necessary ?-Repeat CBC in the AM  ? ?Hypophosphatemia.   ?-Phosphorus of 2.7 ?-Replete with po K Phos Neutral 500 mg po BID x2 yesterday ?-Continue to Monitor and Replete as Necessary ?-Repeat Phos Level in the AM  ? ?Vitamin D Deficiency  ?-Vitamin D level at 19.79.   ?-Continue 50,000 unit vitamin D q. weekly  ? ?Hypomagnesemia.   ?-Mag was 1.7 ?-Replete with IV Mag Sulfate 2 grams again  ?-Continue to Monitor and Replete as necessary  ?-Repeat Mag Level in the AM ? ?Coccyx Wound and Pressure Ulcer ?Pressure Injury 05/04/21 Buttocks Left Stage 2 -   Partial thickness loss of dermis presenting as a shallow open injury with a red, pink wound bed without slough. small dime sized skin tear (Active)  ?05/04/21 0800  ?Location: Buttocks  ?Location Orientation: Left  ?Staging: Stage 2 -  Partial thickness loss of dermis presenting as a shallow open injury with a red, pink wound bed without slough.  ?Wound Description (Comments): small dime sized skin tear  ?Present on Admission: No  ?-[place] consulted and recommending Continue with Aquacel Advantage secured with sacral foam dressing changing BID. If the wound becomes malodorous or worsens we will consider MediHoney or Dakin's. Secretary will order him an air mattress and pressure redistribution chair pad. ?-They are also recommending  "Monitor the wound area(s) for worsening of condition such as: Signs/symptoms of infection,  increase in size, development of or worsening of odor, development of pain, or increased pain at the affected locations." ? Pressure injury of sacral region, unstageable ([place]) ?Clean the sacral area with no rinse cleanser. Apply a cut to fit piece of Aquacel Advantage over the wound and secure with a sacral foam dressing with tip up to help prevent soiling of the wound. Change Aquacel BID. ? ?DVT prophylaxis: Place TED hose  Start: 05/28/21 0949 ?Place TED hose Start: 05/21/21 1624 ?SCDs Start: 05/11/21 2009 ? ?  Code Status: Full Code ?Family Communication: No family Present at bedside  ? ?Disposition Plan:  ?Level of care: Telemetry Medical ?Status is: Inpatient ?Remains inpatient appropriate because:  Has an unsafe Discharge Disposition and has No insurance and needs SNF  ?  ?Consultants:  ?[facility] Nurse ?Neurology ?Neurosurgery ?PCCM last admission ? ?Procedures:  ?None ? ?Antimicrobials:  ?Anti-infectives (From admission, onward)  ? ? None  ? ?  ?  ?Subjective: ?Seen and examined at bedside and he is very upset and started yelling at me because he wanted to go outside and no one was able to  take him outside yesterday because the staff was busy.  He is very frustrated and continues to have swollen legs.  States that he has been urinating a lot.  Not very cooperative with exam given his fru

## 2021-05-30 LAB — CBC WITH DIFFERENTIAL/PLATELET
Abs Immature Granulocytes: 0.08 K/uL — ABNORMAL HIGH (ref 0.00–0.07)
Basophils Absolute: 0 K/uL (ref 0.0–0.1)
Basophils Relative: 0 %
Eosinophils Absolute: 0 K/uL (ref 0.0–0.5)
Eosinophils Relative: 0 %
HCT: 22 % — ABNORMAL LOW (ref 39.0–52.0)
Hemoglobin: 7.2 g/dL — ABNORMAL LOW (ref 13.0–17.0)
Immature Granulocytes: 1 %
Lymphocytes Relative: 9 %
Lymphs Abs: 1.2 K/uL (ref 0.7–4.0)
MCH: 30 pg (ref 26.0–34.0)
MCHC: 32.7 g/dL (ref 30.0–36.0)
MCV: 91.7 fL (ref 80.0–100.0)
Monocytes Absolute: 1.4 K/uL — ABNORMAL HIGH (ref 0.1–1.0)
Monocytes Relative: 11 %
Neutro Abs: 9.8 K/uL — ABNORMAL HIGH (ref 1.7–7.7)
Neutrophils Relative %: 79 %
Platelets: 452 K/uL — ABNORMAL HIGH (ref 150–400)
RBC: 2.4 MIL/uL — ABNORMAL LOW (ref 4.22–5.81)
RDW: 15.7 % — ABNORMAL HIGH (ref 11.5–15.5)
WBC: 12.4 K/uL — ABNORMAL HIGH (ref 4.0–10.5)
nRBC: 0 % (ref 0.0–0.2)

## 2021-05-30 LAB — RETICULOCYTES
Immature Retic Fract: 21.4 % — ABNORMAL HIGH (ref 2.3–15.9)
RBC.: 2.43 MIL/uL — ABNORMAL LOW (ref 4.22–5.81)
Retic Count, Absolute: 45 10*3/uL (ref 19.0–186.0)
Retic Ct Pct: 1.9 % (ref 0.4–3.1)

## 2021-05-30 LAB — COMPREHENSIVE METABOLIC PANEL WITH GFR
ALT: 13 U/L (ref 0–44)
AST: 14 U/L — ABNORMAL LOW (ref 15–41)
Albumin: 1.7 g/dL — ABNORMAL LOW (ref 3.5–5.0)
Alkaline Phosphatase: 76 U/L (ref 38–126)
Anion gap: 3 — ABNORMAL LOW (ref 5–15)
BUN: 24 mg/dL — ABNORMAL HIGH (ref 6–20)
CO2: 20 mmol/L — ABNORMAL LOW (ref 22–32)
Calcium: 7.3 mg/dL — ABNORMAL LOW (ref 8.9–10.3)
Chloride: 114 mmol/L — ABNORMAL HIGH (ref 98–111)
Creatinine, Ser: 0.46 mg/dL — ABNORMAL LOW (ref 0.61–1.24)
GFR, Estimated: >60 mL/min
Glucose, Bld: 113 mg/dL — ABNORMAL HIGH (ref 70–99)
Potassium: 3.4 mmol/L — ABNORMAL LOW (ref 3.5–5.1)
Sodium: 137 mmol/L (ref 135–145)
Total Bilirubin: 0.6 mg/dL (ref 0.3–1.2)
Total Protein: 4.4 g/dL — ABNORMAL LOW (ref 6.5–8.1)

## 2021-05-30 LAB — IRON AND TIBC: Iron: 6 ug/dL — ABNORMAL LOW (ref 45–182)

## 2021-05-30 LAB — MAGNESIUM: Magnesium: 1.7 mg/dL (ref 1.7–2.4)

## 2021-05-30 LAB — FOLATE: Folate: 15.8 ng/mL (ref >5.9)

## 2021-05-30 LAB — FERRITIN: Ferritin: 890 ng/mL — ABNORMAL HIGH (ref 24–336)

## 2021-05-30 LAB — PHOSPHORUS: Phosphorus: 2.6 mg/dL (ref 2.5–4.6)

## 2021-05-30 LAB — VITAMIN B12: Vitamin B-12: 452 pg/mL (ref 180–914)

## 2021-05-30 MED ORDER — POTASSIUM CHLORIDE CRYS ER 20 MEQ PO TBCR
40.0000 meq | EXTENDED_RELEASE_TABLET | Freq: Two times a day (BID) | ORAL | Status: AC
  Administered 2021-05-30 (×2): 40 meq via ORAL
  Filled 2021-05-30 (×2): qty 2

## 2021-05-30 MED ORDER — FUROSEMIDE 10 MG/ML IJ SOLN
40.0000 mg | Freq: Once | INTRAMUSCULAR | Status: AC
  Administered 2021-05-30: 40 mg via INTRAVENOUS
  Filled 2021-05-30: qty 4

## 2021-05-30 MED ORDER — ALBUMIN HUMAN 25 % IV SOLN
12.5000 g | Freq: Three times a day (TID) | INTRAVENOUS | Status: AC
  Administered 2021-05-30 – 2021-06-02 (×9): 12.5 g via INTRAVENOUS
  Filled 2021-05-30 (×10): qty 50

## 2021-05-30 MED ORDER — MAGNESIUM SULFATE 2 GM/50ML IV SOLN
2.0000 g | Freq: Once | INTRAVENOUS | Status: AC
  Administered 2021-05-30: 2 g via INTRAVENOUS
  Filled 2021-05-30: qty 50

## 2021-05-30 NOTE — Plan of Care (Signed)
Patient is s/p fall this afternoon. Fall mats placed on floor per protocol (not previously on floor). PRN pain meds per orders.  ? ? ?Problem: Education: ?Goal: Knowledge of General Education information will improve ?Description: Including pain rating scale, medication(s)/side effects and non-pharmacologic comfort measures ?Outcome: Progressing ?  ?Problem: Health Behavior/Discharge Planning: ?Goal: Ability to manage health-related needs will improve ?Outcome: Progressing ?  ?Problem: Clinical Measurements: ?Goal: Ability to maintain clinical measurements within normal limits will improve ?Outcome: Progressing ?Goal: Will remain free from infection ?Outcome: Progressing ?Goal: Diagnostic test results will improve ?Outcome: Progressing ?Goal: Respiratory complications will improve ?Outcome: Progressing ?Goal: Cardiovascular complication will be avoided ?Outcome: Progressing ?  ?Problem: Activity: ?Goal: Risk for activity intolerance will decrease ?Outcome: Progressing ?  ?Problem: Nutrition: ?Goal: Adequate nutrition will be maintained ?Outcome: Progressing ?  ?Problem: Coping: ?Goal: Level of anxiety will decrease ?Outcome: Progressing ?  ?Problem: Elimination: ?Goal: Will not experience complications related to bowel motility ?Outcome: Progressing ?Goal: Will not experience complications related to urinary retention ?Outcome: Progressing ?  ?Problem: Pain Managment: ?Goal: General experience of comfort will improve ?Outcome: Progressing ?  ?Problem: Safety: ?Goal: Ability to remain free from injury will improve ?Outcome: Progressing ?  ?Problem: Skin Integrity: ?Goal: Risk for impaired skin integrity will decrease ?Outcome: Progressing ?  ?

## 2021-05-30 NOTE — Progress Notes (Signed)
?PROGRESS NOTE ? ? ? ?Logan Nelson  [id number] DOB: Apr 13, 1965 DOA: 05/10/2021 ?PCP: [name], MD  ? ?Brief Narrative:  ?Logan Nelson is a 56 y.o. male with past medical history of severe protein calorie malnutrition, hypertension, anasarca, abdominal wall hernia presented to the hospital with bilateral lower extremity weakness.  Patient was recently hospitalized for abdominal hernia with incarceration and had a open primary repair but then had left AMA before he was placed in rehab.  Patient was admitted this time with ambulatory dysfunction and bilateral lower extremity weakness.  He is improving slowly but is unsafe discharge disposition as he is homeless and PT OT recommending SNF but currently no bed offers so cannot safely discharge him. ? ?Given his lower extremity swelling and anasarca we will start him on albumin 12.5 g every 8 for 3 days and also give him a dose of IV Lasix and recommend elevation of his extremities and TED hose. Will repeat a Dose of Lasix today given that he continues to have significant LE Edema.  ? ?Blood Count dropped some so will get FOBT. Patient very upset that staff didn't have the time to take him outside yesterday and threatening to leave AMA again if he is not taken outside.  ? ?Assessment and plan ? ?Principal Problem: ?  Lower extremity weakness ?Active Problems: ?  Hypertension ?  Protein-calorie malnutrition, severe ?  Hypokalemia ?  Anemia of chronic disease ?  GERD (gastroesophageal reflux disease) ?  Generalized weakness ?  Hypophosphatemia ?  Hypomagnesemia ?  Vitamin D deficiency ?  Pressure injury of buttock, stage 2 ([place]) ?  Pressure injury of sacral region, unstageable ([place]) ?  ? ?BLE weakness/RUE weakness/ambulatory dysfunction, deconditioning ?-Thought to be secondary to deconditioning and severe malnutrition.   ?-Seen by neurology.   ?-There was some enhancement on the MRI scan of the spine.  -Neurosurgery was consulted and this looks like  nonspecific finding .  No signs of infection.   ?-Vitamin B1 level at 127.  Vitamin B12 at 573.   TSH at 2.975  -Vitamin D level very low at 19.7.   ?-Seen by physical therapy with recommendation of SNF placement. ?-U/A Uremarkable ?-C/w Vitamin D 50,000 units every 7 days ?-TOC for SNF placement for safe discharge disposition however he is a difficult placement given no insurance and homelessness ? ?Severe protein-calorie malnutrition with mild hyperglycemia. ?Hypoalbuminemia  ?-Present on admission.   ?-Nutrition Status: ?Nutrition Problem: Severe Malnutrition ?Etiology: chronic illness ?Signs/Symptoms: severe fat depletion, severe muscle depletion, percent weight loss (17.9% weight loss in less than 7 months) ?Percent weight loss: 17.9 % ?Interventions: MVI, Boost Breeze, Refer to RD note for recommendations, Other (Comment), Magic cup (vitamin D, double protein) ?-Continue supplementation.  Albumin now <1.5 x2 and repeat Albumin being 1.5 x2 and is now 1.7 ?-Initiated Albumin 12.5 mg q8h x 3 again ?-Will encourage oral nutrition.  Continue Ensure supplements. ?  ?Anasarca, mildly improving  ?-Likely Secondary to severe malnutrition and low albumin.   ?-Encouraged oral intake and C/w Juven 1 packet po BID. ?-Trying some Albumin as above and will give IV Lasix 40 mg x1 again this AM  ?-ECHO done last hospitalization showed EF of 60-65% and Normal Left Ventricular Diastolic Parameters ?-TSH was 2.975 ?-Initial Urinalysis done on 05/10/21 done and showed no Proteinuria ?-Liver U/S if doesn't improve  ?  ?S/p open primary repair of chronically incarcerated ventral incisional hernia, primary right inguinal hernia repair without mesh, extensive lysis of adhesions on  04/30/2021. ?-Leukocytosis had resolved but is now worsening as it went from 10.1 -> 12.2 x2 -> 12.4 ?-Continue dressing changes ? ?Diarrhea. ?-Negative C. difficile screen.   ?-GI pathogen panel was negative. Could be bile acid associated diarrhea.  He states  that his diarrhea started after cholecystectomy.      ?-C/w Lomotil, loperamide as needed  ?-C/w Cholestyramine 4 g p.o. twice daily  ? ?HTN ?-Borderline low with last BP being 116/50 ?-Not on any medications.   ?-Continue to monitor BP's per Protocol  ? ?Anemia of chronic disease ?-Hgb/Hct slowly trending down and went from 10.4/33.0 -> 9.8/31.2 -> 9.3/28.4 -> 9.2/27.7 -> 8.5/25.4 -> 8.2/24.2 -> 8.5/26.5 -> 7.3/21.8 -> 7.2/22.0 ?-Check FOBT given that Hgb/Hct continues to Drop ?-May get CT Abd/Pelvis to evaluate for ? Retroperitoneal Hematoma ?-Anemia Panel Done last Hospitalization showed Iron level of 14, Ferritin Level of 831, and Folate of 33.3 and repeat Iron Level was 6, Ferritin was 890, Folate was 15.8, and Vitamin B12 was 452 ?-Continue to Monitor for S/Sx of Bleeding; No overt bleeding noted ?-Repeat CBC in the AM  ? ?Metabolic Acidosis ?-Mild and improved ?-CO2 is now 20, AG is 3, Chloride Level is now 114 ?-Continue to Monitor and Trend ?-Repeat CMP in the AM  ?    ?GERD ?-C/w PPI with Pantoprazole 40 mg po Daily  ?  ?Hypokalemia ?-Replete during hospitalization.  Potassium is now 3.4 this morning  ?-Replete with po KCL 40 mEQ BID x2 ?-Continue monitor electrolytes and replete as necessary ?-Repeat CMP in the AM ? ?Hypocalcemia ?-Patient's Ca2+ was 7.3 this AM again ?-Continue to Monitor and Replete as Necessary ? ?Leukocytosis ?-Likely Reactive and was Improving but slightly bumped ?-WBC went from 8.8 -> 13.7 -> 13.0 -> 10.3 -> 10.3 -> 10.1 -> 12.2 x2 -> 12.4 ?-Continue to Monitor and Trend as Necessary ?-Repeat CBC in the AM  ? ?Hypophosphatemia.   ?-Phosphorus of 2.6 ?-Replete with po K Phos Neutral 500 mg po BID x2 the day before yesterday ?-Continue to Monitor and Replete as Necessary ?-Repeat Phos Level in the AM  ? ?Vitamin D Deficiency  ?-Vitamin D level at 19.79.   ?-Continue 50,000 unit vitamin D q. weekly  ? ?Hypomagnesemia.   ?-Mag was 1.7 ?-Replete with IV Mag Sulfate 2 grams again   ?-Continue to Monitor and Replete as necessary  ?-Repeat Mag Level in the AM ? ?Coccyx Wound and Pressure Ulcer ?Pressure Injury 05/04/21 Buttocks Left Stage 2 -  Partial thickness loss of dermis presenting as a shallow open injury with a red, pink wound bed without slough. small dime sized skin tear (Active)  ?05/04/21 0800  ?Location: Buttocks  ?Location Orientation: Left  ?Staging: Stage 2 -  Partial thickness loss of dermis presenting as a shallow open injury with a red, pink wound bed without slough.  ?Wound Description (Comments): small dime sized skin tear  ?Present on Admission: No  ?-[place] consulted and recommending Continue with Aquacel Advantage secured with sacral foam dressing changing BID. If the wound becomes malodorous or worsens we will consider MediHoney or Dakin's. Secretary will order him an air mattress and pressure redistribution chair pad. ?-They are also recommending  "Monitor the wound area(s) for worsening of condition such as: Signs/symptoms of infection,  increase in size, development of or worsening of odor, development of pain, or increased pain at the affected locations." ?   ?Assessment and Plan: ?Pressure injury of sacral region, unstageable ([place]) ?Clean the sacral area with no  rinse cleanser. Apply a cut to fit piece of Aquacel Advantage over the wound and secure with a sacral foam dressing with tip up to help prevent soiling of the wound. Change Aquacel BID. ? ?DVT prophylaxis: Place TED hose Start: 05/28/21 0949 ?Place TED hose Start: 05/21/21 1624 ?SCDs Start: 05/11/21 2009 ? ?  Code Status: Full Code ?Family Communication: No family present at bedside  ? ?Disposition Plan:  ?Level of care: Telemetry Medical ?Status is: Inpatient ?Remains inpatient appropriate because: Has an unsafe Discharge Disposition and has No insurance and needs SNF  ?  ?Consultants:  ?[facility] Nurse ?Neurology ?Neurosurgery ?PCCM last admission ? ?Procedures:  ?None ? ?Antimicrobials:  ?Anti-infectives  (From admission, onward)  ? ? None  ? ?  ?  ?Subjective: ?Seen and examined at bedside and he is calmer today and yelling.  He is sitting in the chair and thinks he is getting slowly better.  Thinks the leg swel

## 2021-05-30 NOTE — Progress Notes (Signed)
RN was called to pt room by Shirlee Limerick, [place].  Upon this RN entry into pt room he was sitting on the floor with stool underneath him.  Pt had raised his bed to its highest position.  He stated that he was trying to get up to the bathroom by himself.  PT had call bell and phone within his reach and had been advised not to get up alone.  Other staff members came to help pt stand and get back to bed.  Vitals taken and stable.  Pt had no obvious physical injuries and states he did not hurt anything.  MD was text paged through [facility] and awaiting call.  Pt in bed with call light and phone within reach.  Report given to night RN.  WIll continue to monitor.      ?

## 2021-05-31 LAB — CBC WITH DIFFERENTIAL/PLATELET
Abs Immature Granulocytes: 0.04 10*3/uL (ref 0.00–0.07)
Basophils Absolute: 0 10*3/uL (ref 0.0–0.1)
Basophils Relative: 0 %
Eosinophils Absolute: 0 10*3/uL (ref 0.0–0.5)
Eosinophils Relative: 0 %
HCT: 20.6 % — ABNORMAL LOW (ref 39.0–52.0)
Hemoglobin: 6.4 g/dL — CL (ref 13.0–17.0)
Immature Granulocytes: 0 %
Lymphocytes Relative: 11 %
Lymphs Abs: 1.3 10*3/uL (ref 0.7–4.0)
MCH: 29.2 pg (ref 26.0–34.0)
MCHC: 31.1 g/dL (ref 30.0–36.0)
MCV: 94.1 fL (ref 80.0–100.0)
Monocytes Absolute: 1.2 10*3/uL — ABNORMAL HIGH (ref 0.1–1.0)
Monocytes Relative: 11 %
Neutro Abs: 8.5 10*3/uL — ABNORMAL HIGH (ref 1.7–7.7)
Neutrophils Relative %: 78 %
Platelets: 409 10*3/uL — ABNORMAL HIGH (ref 150–400)
RBC: 2.19 MIL/uL — ABNORMAL LOW (ref 4.22–5.81)
RDW: 16 % — ABNORMAL HIGH (ref 11.5–15.5)
WBC: 11.1 10*3/uL — ABNORMAL HIGH (ref 4.0–10.5)
nRBC: 0.2 % (ref 0.0–0.2)

## 2021-05-31 LAB — PHOSPHORUS: Phosphorus: 2.5 mg/dL (ref 2.5–4.6)

## 2021-05-31 LAB — COMPREHENSIVE METABOLIC PANEL
ALT: 11 U/L (ref 0–44)
AST: 12 U/L — ABNORMAL LOW (ref 15–41)
Albumin: 1.7 g/dL — ABNORMAL LOW (ref 3.5–5.0)
Alkaline Phosphatase: 72 U/L (ref 38–126)
Anion gap: 3 — ABNORMAL LOW (ref 5–15)
BUN: 19 mg/dL (ref 6–20)
CO2: 21 mmol/L — ABNORMAL LOW (ref 22–32)
Calcium: 7.4 mg/dL — ABNORMAL LOW (ref 8.9–10.3)
Chloride: 114 mmol/L — ABNORMAL HIGH (ref 98–111)
Creatinine, Ser: 0.48 mg/dL — ABNORMAL LOW (ref 0.61–1.24)
GFR, Estimated: >60 mL/min (ref >60)
Glucose, Bld: 87 mg/dL (ref 70–99)
Potassium: 3.8 mmol/L (ref 3.5–5.1)
Sodium: 138 mmol/L (ref 135–145)
Total Bilirubin: 0.5 mg/dL (ref 0.3–1.2)
Total Protein: 4.3 g/dL — ABNORMAL LOW (ref 6.5–8.1)

## 2021-05-31 LAB — MAGNESIUM: Magnesium: 1.8 mg/dL (ref 1.7–2.4)

## 2021-05-31 LAB — HEMOGLOBIN AND HEMATOCRIT, BLOOD
HCT: 23.2 % — ABNORMAL LOW (ref 39.0–52.0)
Hemoglobin: 7.4 g/dL — ABNORMAL LOW (ref 13.0–17.0)

## 2021-05-31 LAB — PREPARE RBC (CROSSMATCH)

## 2021-05-31 MED ORDER — IOHEXOL 9 MG/ML PO SOLN: 500.0000 mL | ORAL | Status: AC

## 2021-05-31 MED ORDER — FUROSEMIDE 10 MG/ML IJ SOLN
INTRAMUSCULAR | Status: AC
  Filled 2021-05-31: qty 4

## 2021-05-31 MED ORDER — IOHEXOL 9 MG/ML PO SOLN
ORAL | Status: AC
  Administered 2021-05-31: 500 mL
  Filled 2021-05-31: qty 1000

## 2021-05-31 MED ORDER — FUROSEMIDE 10 MG/ML IJ SOLN
40.0000 mg | Freq: Once | INTRAMUSCULAR | Status: AC
  Administered 2021-05-31: 40 mg via INTRAVENOUS
  Filled 2021-05-31: qty 4

## 2021-05-31 MED ORDER — MAGNESIUM SULFATE 2 GM/50ML IV SOLN
2.0000 g | Freq: Once | INTRAVENOUS | Status: AC
  Administered 2021-05-31: 2 g via INTRAVENOUS
  Filled 2021-05-31: qty 50

## 2021-05-31 MED ORDER — SODIUM CHLORIDE 0.9% IV SOLUTION: Freq: Once | INTRAVENOUS | Status: AC

## 2021-05-31 NOTE — Progress Notes (Signed)
[facility] night cross cover note: ? ?I was notified by RN of patient's updated hemoglobin this morning, with value of 6.4.  This is relative to 7.2 on 4/23 and 7.3 on 4/22.   ? ?Per my chart review, including review of most recent rounding hospitalist progress note, this hospitalization as has been associate with progressive decline in patient's hemoglobin relative to baseline anemia of chronic disease.  No overt evidence of bleed identified over course of hospitalization. ? ?Vital signs remained stable, in the absence of any hypotension, and heart rates noted to be in the 60s to 70s.  Patient without any acute symptoms at this time. ? ?I subsequently placed order for transfusion of 1 unit PRBC over 3 hours, with repeat hemoglobin level to be checked following this transfusion. ? ?Consent for blood product administration has not yet placed during this hospitalization, and is currently pending.  ? ? ? ?Newton Pigg, DO ?Hospitalist ? ?

## 2021-05-31 NOTE — Progress Notes (Signed)
?PROGRESS NOTE ? ? ?Logan Nelson  UYQ:[id number] DOB: Aug 02, 1965 DOA: 05/10/2021 ?PCP: [name], MD  ? ?Brief Narrative:  ?Logan Nelson is a 56 y.o. male with past medical history of severe protein calorie malnutrition, hypertension, anasarca, abdominal wall hernia presented to the hospital with bilateral lower extremity weakness.  Patient was recently hospitalized for abdominal hernia with incarceration and had a open primary repair but then had left AMA before he was placed in rehab.  Patient was admitted this time with ambulatory dysfunction and bilateral lower extremity weakness.  He is improving slowly but is unsafe discharge disposition as he is homeless and PT OT recommending SNF but currently no bed offers so cannot safely discharge him. ? ?Given his lower extremity swelling and anasarca we will start him on albumin 12.5 g every 8 for 3 days and also give him a dose of IV Lasix and recommend elevation of his extremities and TED hose. Will repeat a Dose of Lasix today given that he continues to have significant LE Edema.  ? ?Blood Count dropped some so will get FOBT. Patient very upset that staff didn't have the time to take him outside yesterday and threatening to leave AMA again if he is not taken outside.  ? ?05/31/2021 Patient's Blood Count dropped further so he was typed and screened and will transfuse 1 unit of pRBCs. Will obtain CT Scan of the Abd/Pelvis and may need to consult General Surgery to evaluate Coccyx Ulcer.  ? ?Assessment and plan ? ?Principal Problem: ?  Lower extremity weakness ?Active Problems: ?  Hypertension ?  Protein-calorie malnutrition, severe ?  Hypokalemia ?  Anemia of chronic disease ?  GERD (gastroesophageal reflux disease) ?  Generalized weakness ?  Hypophosphatemia ?  Hypomagnesemia ?  Vitamin D deficiency ?  Pressure injury of buttock, stage 2 (HCC) ?  Pressure injury of sacral region, unstageable (HCC) ?  ? ?BLE weakness/RUE weakness/ambulatory dysfunction,  deconditioning ?-Thought to be secondary to deconditioning and severe malnutrition.   ?-Seen by neurology.   ?-There was some enhancement on the MRI scan of the spine.  -Neurosurgery was consulted and this looks like nonspecific finding .  No signs of infection.   ?-Vitamin B1 level at 127.  Vitamin B12 at 573.   TSH at 2.975  -Vitamin D level very low at 19.7.   ?-Seen by physical therapy with recommendation of SNF placement. ?-U/A Uremarkable ?-C/w Vitamin D 50,000 units every 7 days ?-TOC for SNF placement for safe discharge disposition however he is a difficult placement given no insurance and homelessness ?-Continue PT/OT to evaluate and Treat  ? ?Severe protein-calorie malnutrition with mild hyperglycemia. ?Hypoalbuminemia  ?-Present on admission.   ?-Nutrition Status: ?Nutrition Problem: Severe Malnutrition ?Etiology: chronic illness ?Signs/Symptoms: severe fat depletion, severe muscle depletion, percent weight loss (17.9% weight loss in less than 7 months) ?Percent weight loss: 17.9 % ?Interventions: MVI, Boost Breeze, Refer to RD note for recommendations, Other (Comment), Magic cup (vitamin D, double protein) ?-Continue supplementation.  Albumin now <1.5 x2 and repeat Albumin being 1.5 x2 and is now 1.7 x2 ?-Initiated Albumin 12.5 mg q8h x 3 again and continue Lasix ?-Will encourage oral nutrition.  Continue Ensure supplements. ?  ?Anasarca, mildly improving  ?-Likely Secondary to severe malnutrition and low albumin.   ?-Encouraged oral intake and C/w Juven 1 packet po BID. ?-Trying some Albumin as above and will give IV Lasix 40 mg x1 again this AM  ?-ECHO done last hospitalization showed EF of 60-65%  and Normal Left Ventricular Diastolic Parameters ?-TSH was 2.975 ?-Initial Urinalysis done on 05/10/21 done and showed no Proteinuria ?-Liver U/S if doesn't improve  ?  ?S/p open primary repair of chronically incarcerated ventral incisional hernia, primary right inguinal hernia repair without mesh, extensive  lysis of adhesions on 04/30/2021. ?-Leukocytosis had resolved but is now worsening as it went from 10.1 -> 12.2 x2 -> 12.4 -> 11.1 ?-Continue dressing changes ? ?Diarrhea ?-Negative C. difficile screen.   ?-GI pathogen panel was negative. Could be bile acid associated diarrhea.  He states that his diarrhea started after cholecystectomy.      ?-C/w Lomotil, loperamide as needed  ?-C/w Cholestyramine 4 g p.o. twice daily  ? ?HTN ?-Borderline low with last BP being 138/87 ?-Not on any medications.   ?-Continue to monitor BP's per Protocol  ? ?Anemia of chronic disease ?-Hgb/Hct slowly trending down and went from 10.4/33.0 -> 9.8/31.2 -> 9.3/28.4 -> 9.2/27.7 -> 8.5/25.4 -> 8.2/24.2 -> 8.5/26.5 -> 7.3/21.8 -> 7.2/22.0 -> 6.4/20.6 so was typed and screened and transfused 1 unit of pRBCS ?-Check FOBT given that Hgb/Hct continues to Drop ?-May get CT Abd/Pelvis to evaluate for ? Retroperitoneal Hematoma ?-Anemia Panel Done last Hospitalization showed Iron level of 14, Ferritin Level of 831, and Folate of 33.3 and repeat Iron Level was 6, Ferritin was 890, Folate was 15.8, and Vitamin B12 was 452 ?-Continue to Monitor for S/Sx of Bleeding; No overt bleeding noted ?-Repeat CBC in the AM  ? ?Metabolic Acidosis ?-Mild and improved ?-CO2 is now 21, AG is 3, Chloride Level is now 114 ?-Continue to Monitor and Trend ?-Repeat CMP in the AM  ?    ?GERD ?-C/w PPI with Pantoprazole 40 mg po Daily  ?  ?Hypokalemia ?-Replete during hospitalization.  Potassium is now 3.8 ?-Continue monitor electrolytes and replete as necessary ?-Repeat CMP in the AM ? ?Hypocalcemia ?-Patient's Ca2+ was 7.4 this AM ?-Continue to Monitor and Replete as Necessary ? ?Leukocytosis ?-Likely Reactive and was Improving but slightly bumped ?-WBC went from 8.8 -> 13.7 -> 13.0 -> 10.3 -> 10.3 -> 10.1 -> 12.2 x2 -> 12.4 -> 11.1 ?-Continue to Monitor and Trend as Necessary ?-Repeat CBC in the AM  ? ?Hypophosphatemia.   ?-Phosphorus of 2.5 ?-Replete with po K Phos  Neutral 500 mg po BID x2 the day before yesterday ?-Continue to Monitor and Replete as Necessary ?-Repeat Phos Level in the AM  ? ?Vitamin D Deficiency  ?-Vitamin D level at 19.79.   ?-Continue 50,000 unit vitamin D q. weekly  ? ?Hypomagnesemia.   ?-Mag was 1.8 ?-Replete with IV Mag Sulfate 2 grams again  ?-Continue to Monitor and Replete as necessary  ?-Repeat Mag Level in the AM ? ?Coccyx Wound and Pressure Ulcer ?Pressure Injury 05/04/21 Buttocks Left Stage 2 -  Partial thickness loss of dermis presenting as a shallow open injury with a red, pink wound bed without slough. small dime sized skin tear (Active)  ?05/04/21 0800  ?Location: Buttocks  ?Location Orientation: Left  ?Staging: Stage 2 -  Partial thickness loss of dermis presenting as a shallow open injury with a red, pink wound bed without slough.  ?Wound Description (Comments): small dime sized skin tear  ?Present on Admission: No  ?-[place] consulted and recommending Continue with Aquacel Advantage secured with sacral foam dressing changing BID. If the wound becomes malodorous or worsens we will consider MediHoney or [employer]. Secretary will order him an air mattress and pressure redistribution chair pad. ?-They are also  recommending  "Monitor the wound area(s) for worsening of condition such as: Signs/symptoms of infection,  increase in size, development of or worsening of odor, development of pain, or increased pain at the affected locations." ?-May get CT Scan if continues to worsen ?   ?Assessment and Plan: ?Pressure injury of sacral region, unstageable ([place]) ?Clean the sacral area with no rinse cleanser. Apply a cut to fit piece of Aquacel Advantage over the wound and secure with a sacral foam dressing with tip up to help prevent soiling of the wound. Change Aquacel BID. ? ?DVT prophylaxis: Place TED hose Start: 05/28/21 0949 ?Place TED hose Start: 05/21/21 1624 ?SCDs Start: 05/11/21 2009 ? ?  Code Status: Full Code ?Family Communication: No family  present at bedside  ? ?Disposition Plan:  ?Level of care: Telemetry Medical ?Status is: Inpatient ?Remains inpatient appropriate because: Has an unsafe Discharge Disposition and has No insurance and needs

## 2021-05-31 NOTE — Progress Notes (Signed)
PT Cancellation Note ? ?Patient Details ?Name: Logan Nelson ?MRN: [id number] ?DOB: Mar 07, 1965 ? ? ?Cancelled Treatment:    Reason Eval/Treat Not Completed: Patient declined, no reason specified.  I'm going outside.  I'm not getting up except to go outside, I'm not walking.   ?05/31/2021 ? ?Jacinto Halim., PT ?Acute Rehabilitation Services ?[phone]  (pager) ?[phone]  (office) ? ? ?[name] ?05/31/2021, 11:31 AM ?

## 2021-06-01 ENCOUNTER — Inpatient Hospital Stay (HOSPITAL_COMMUNITY): Payer: Medicaid Other

## 2021-06-01 LAB — CBC WITH DIFFERENTIAL/PLATELET
Abs Immature Granulocytes: 0.06 10*3/uL (ref 0.00–0.07)
Basophils Absolute: 0 10*3/uL (ref 0.0–0.1)
Basophils Relative: 0 %
Eosinophils Absolute: 0.1 10*3/uL (ref 0.0–0.5)
Eosinophils Relative: 1 %
HCT: 23.7 % — ABNORMAL LOW (ref 39.0–52.0)
Hemoglobin: 7.7 g/dL — ABNORMAL LOW (ref 13.0–17.0)
Immature Granulocytes: 1 %
Lymphocytes Relative: 13 %
Lymphs Abs: 1.4 10*3/uL (ref 0.7–4.0)
MCH: 29.8 pg (ref 26.0–34.0)
MCHC: 32.5 g/dL (ref 30.0–36.0)
MCV: 91.9 fL (ref 80.0–100.0)
Monocytes Absolute: 1.1 10*3/uL — ABNORMAL HIGH (ref 0.1–1.0)
Monocytes Relative: 10 %
Neutro Abs: 8.2 10*3/uL — ABNORMAL HIGH (ref 1.7–7.7)
Neutrophils Relative %: 75 %
Platelets: 417 10*3/uL — ABNORMAL HIGH (ref 150–400)
RBC: 2.58 MIL/uL — ABNORMAL LOW (ref 4.22–5.81)
RDW: 15.3 % (ref 11.5–15.5)
WBC: 10.9 10*3/uL — ABNORMAL HIGH (ref 4.0–10.5)
nRBC: 0 % (ref 0.0–0.2)

## 2021-06-01 LAB — TYPE AND SCREEN
ABO/RH(D): O POS
Antibody Screen: NEGATIVE
Unit division: 0

## 2021-06-01 LAB — COMPREHENSIVE METABOLIC PANEL
ALT: 12 U/L (ref 0–44)
AST: 13 U/L — ABNORMAL LOW (ref 15–41)
Albumin: 1.7 g/dL — ABNORMAL LOW (ref 3.5–5.0)
Alkaline Phosphatase: 80 U/L (ref 38–126)
Anion gap: 4 — ABNORMAL LOW (ref 5–15)
BUN: 18 mg/dL (ref 6–20)
CO2: 22 mmol/L (ref 22–32)
Calcium: 7.4 mg/dL — ABNORMAL LOW (ref 8.9–10.3)
Chloride: 111 mmol/L (ref 98–111)
Creatinine, Ser: 0.46 mg/dL — ABNORMAL LOW (ref 0.61–1.24)
GFR, Estimated: >60 mL/min (ref >60)
Glucose, Bld: 81 mg/dL (ref 70–99)
Potassium: 3.5 mmol/L (ref 3.5–5.1)
Sodium: 137 mmol/L (ref 135–145)
Total Bilirubin: 0.6 mg/dL (ref 0.3–1.2)
Total Protein: 4.4 g/dL — ABNORMAL LOW (ref 6.5–8.1)

## 2021-06-01 LAB — PHOSPHORUS: Phosphorus: 2.8 mg/dL (ref 2.5–4.6)

## 2021-06-01 LAB — BPAM RBC
Blood Product Expiration Date: 202305262359
ISSUE DATE / TIME: 202304241053
Unit Type and Rh: 5100

## 2021-06-01 LAB — MAGNESIUM: Magnesium: 1.9 mg/dL (ref 1.7–2.4)

## 2021-06-01 IMAGING — CT CT ABD-PELV W/O CM
2 of 4 series · 16 of 46 positions shown, 18 images · non-contrast
Comparison: CT [DATE].

CLINICAL DATA: Abdominal pain, acute, nonlocalized



[Series 3: ap without · axial · non-contrast · 0.85mm/px · z∈[+983,+1453]mm · 13 of 106 slices shown, 15 images]
[im 6/106  soft-tissue]
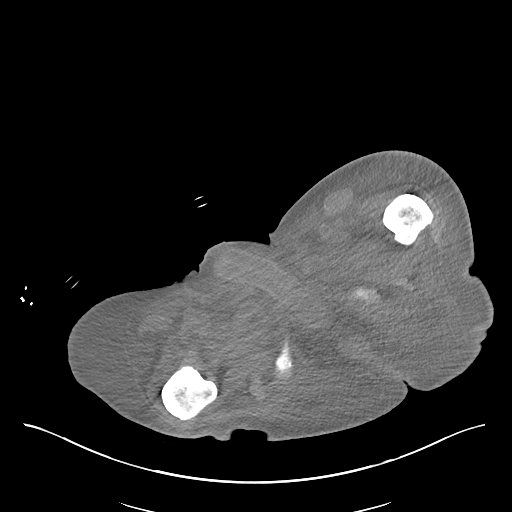
[im 6/106  bone]
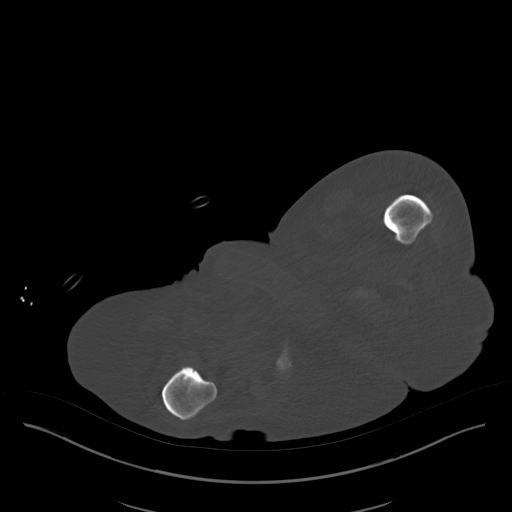
[im 17/106  soft-tissue]
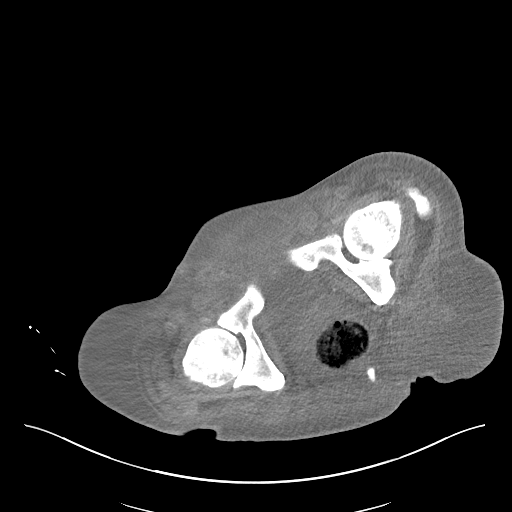
[im 23/106  soft-tissue]
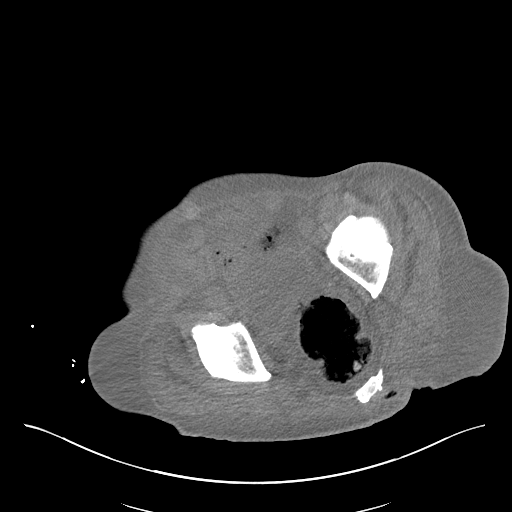
[im 28/106  soft-tissue]
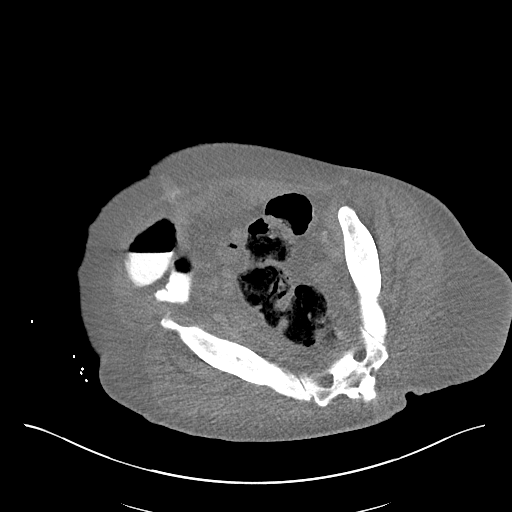
[im 39/106  soft-tissue]
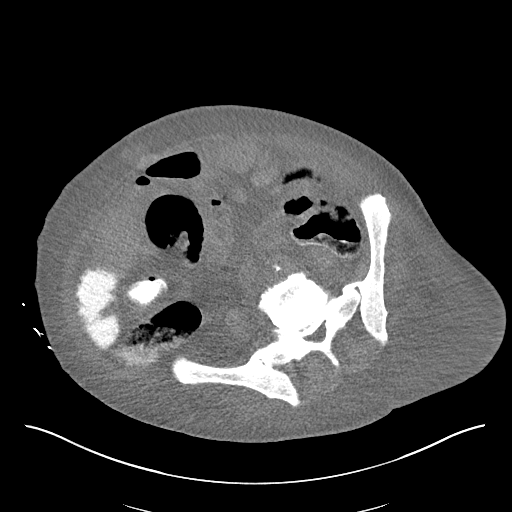
[im 45/106  soft-tissue]
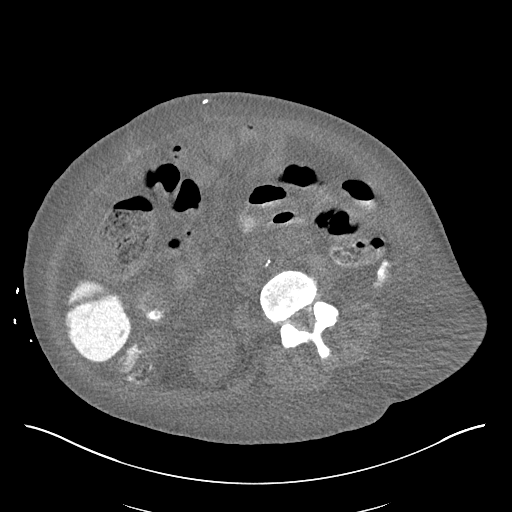
[im 56/106  soft-tissue]
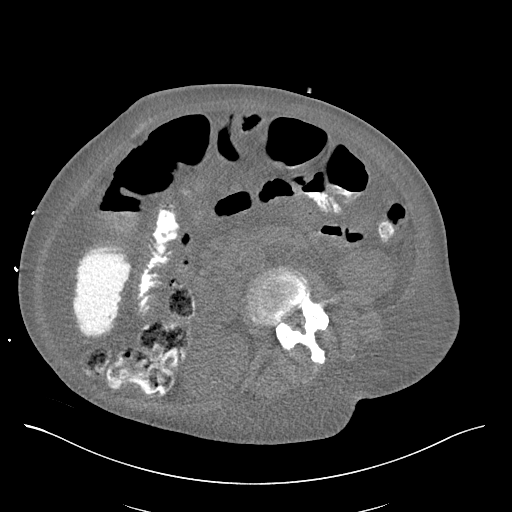
[im 61/106  soft-tissue]
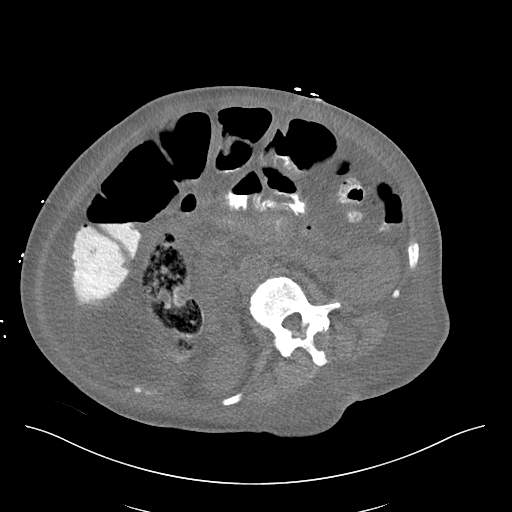
[im 67/106  soft-tissue]
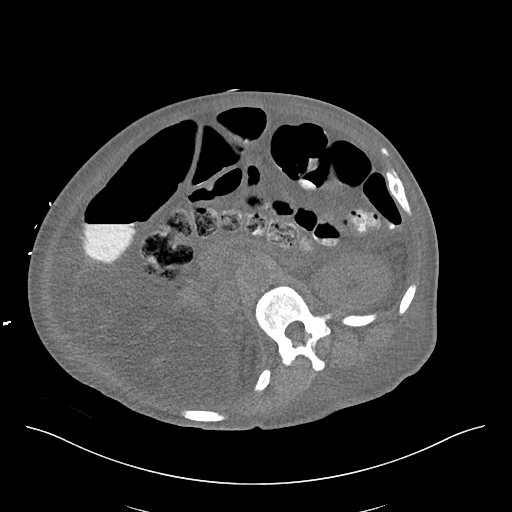
[im 67/106  bone]
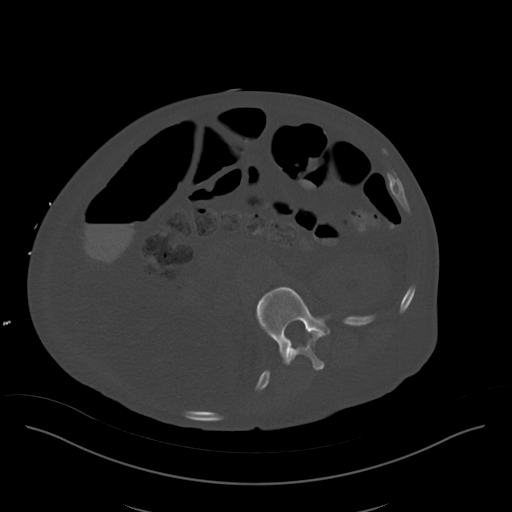
[im 78/106  soft-tissue]
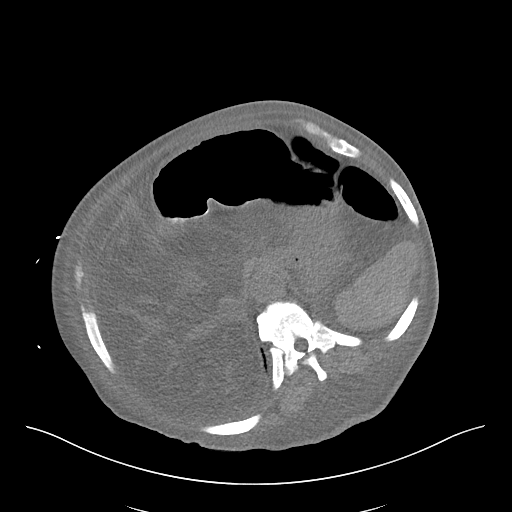
[im 83/106  soft-tissue]
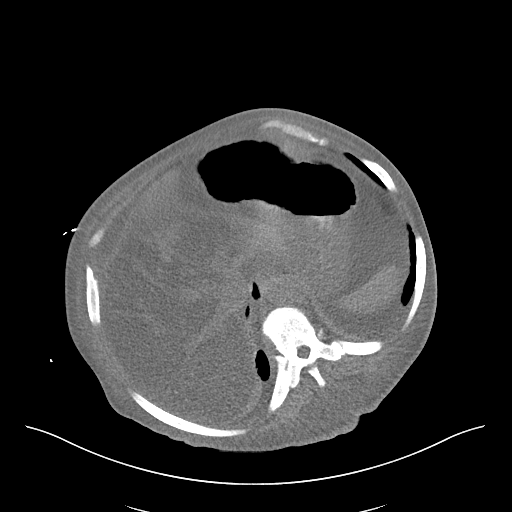
[im 89/106  soft-tissue]
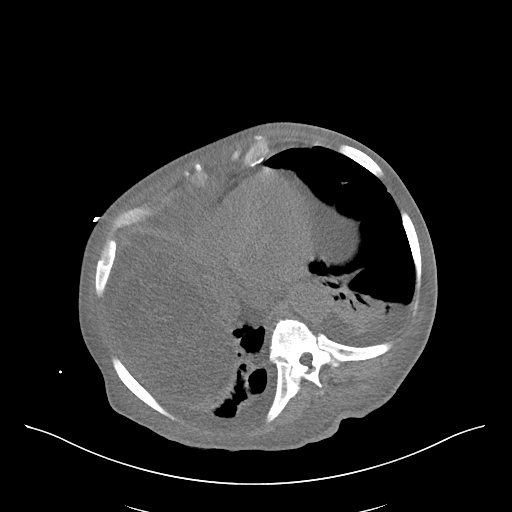
[im 100/106  soft-tissue]
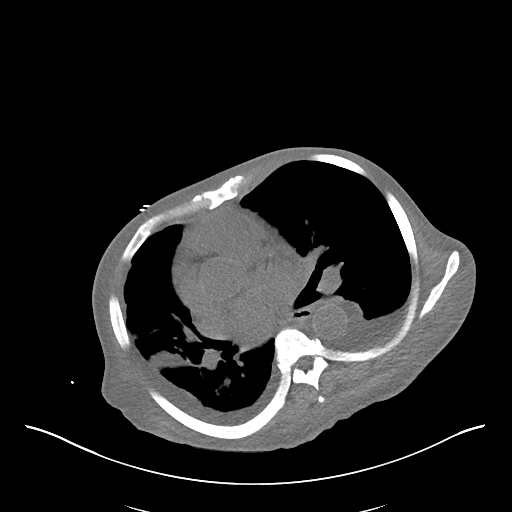

[Series 6: cor · coronal · 0.83mm/px · 3 of 120 slices shown]
[im 40/120  soft-tissue]
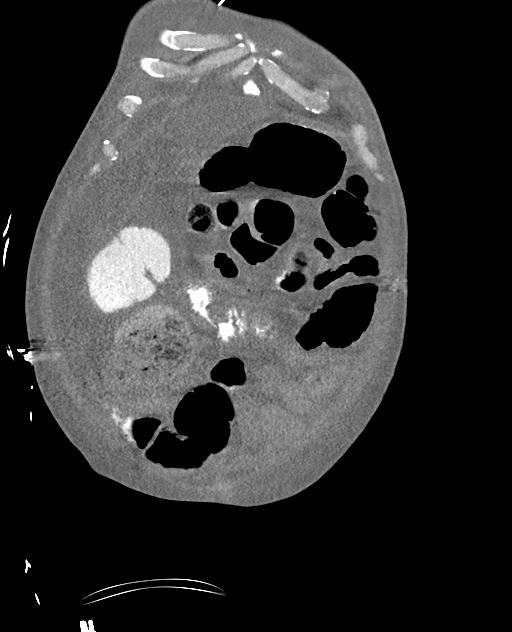
[im 53/120  soft-tissue]
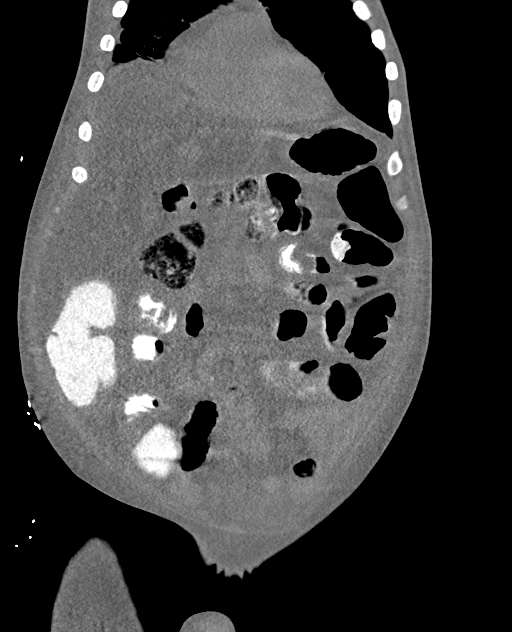
[im 67/120  soft-tissue]
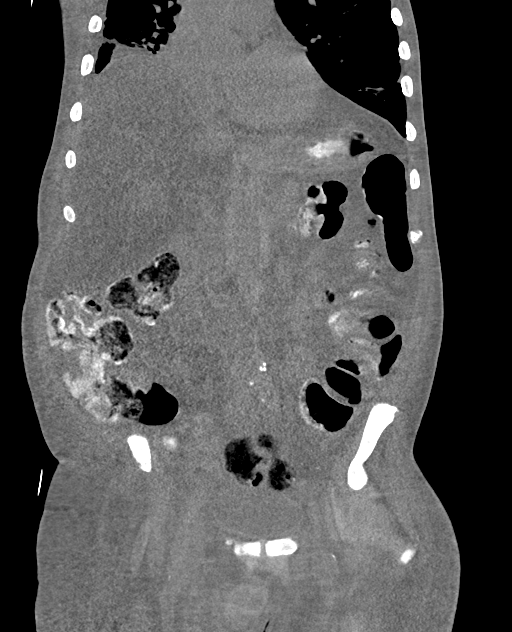

[16 of 46 positions shown; findings below may reference images not displayed]

FINDINGS: Lower chest: Small bilateral pleural effusions with adjacent
consolidations.

Hepatobiliary: Severe hepatic steatosis. Gallbladder is not well
visualized. No biliary ductal dilation.

Pancreas: No pancreatic ductal dilation.

Spleen: No splenomegaly.

Adrenals/Urinary Tract: Bilateral adrenal glands are within normal
limits. No hydronephrosis. Urinary bladder is unremarkable for
degree of distension.

Stomach/Bowel: Radiopaque contrast material traverses the distal
colon. Bowel loops are not well evaluated secondary to severe
anasarca. Gaseous distension of small and large bowel with gas
visualized in the rectum.

Vascular/Lymphatic: Aortic atherosclerosis without abdominal aortic
aneurysm. No pathologically enlarged abdominal or pelvic lymph nodes
identified.

Reproductive: Prostate is unremarkable.

Other: Ascites with severe diffuse soft tissue anasarca.
Postsurgical change of ventral abdominal hernia repair.

Musculoskeletal: Multilevel degenerative changes spine. No acute
osseous abnormality. Sclerosis of the bilateral femoral heads likely
reflects avascular necrosis.
IMPRESSION: 1. Gaseous distension of small and large bowel with radiopaque
contrast extending into distal large bowel and gas visualized into
the rectum, suggestive of ileus. No evidence of high-grade
obstruction.
2. Small volume ascites with severe anasarca common likely
reflecting third spacing secondary to hypoproteinemia.
3. Severe hepatic steatosis.
4. Small bilateral pleural effusions with adjacent consolidations
which may reflect infiltrate or atelectasis.

Aortic Atherosclerosis ([P8]-[P8]).

## 2021-06-01 MED ORDER — BOOST / RESOURCE BREEZE PO LIQD CUSTOM
1.0000 | Freq: Three times a day (TID) | ORAL | Status: DC
  Administered 2021-06-01 – 2021-06-15 (×34): 1 via ORAL

## 2021-06-01 MED ORDER — FUROSEMIDE 10 MG/ML IJ SOLN
40.0000 mg | Freq: Once | INTRAMUSCULAR | Status: AC
  Administered 2021-06-01: 40 mg via INTRAVENOUS
  Filled 2021-06-01: qty 4

## 2021-06-01 NOTE — Progress Notes (Signed)
OT Cancellation Note ? ?Patient Details ?Name: Logan Nelson ?MRN: [id number] ?DOB: December 25, 1965 ? ? ?Cancelled Treatment:    Reason Eval/Treat Not Completed: Patient at procedure or test/ unavailable (Patient states he is going for MRI and doen't want to do anything else till he returns due to be tired from PT earlier. OT to attempt at later time as schedule permits) ?Alfonse Flavors, OTA ?Acute Rehabilitation Services  ?Pager [phone] ?Office [phone] ? ?[name] ?06/01/2021, 2:05 PM ?

## 2021-06-01 NOTE — Progress Notes (Signed)
Nutrition Follow-up ? ?DOCUMENTATION CODES:  ?Severe malnutrition in context of chronic illness ? ?INTERVENTION:  ?Continue current diet as ordered, encourage PO intake ?1 packet Juven BID, each packet provides 95 calories, 2.5 grams of protein (collagen) + micronutrients for wound healing ?continue Magic cup TID with meals, each supplement provides 290 kcal and 9 grams of protein ?continue double protein portions TID w/ meals ?continue MVI with minerals daily ?Continue vitamin D 50,000 units weekly  ?continue vitamin A 10,000 units daily x 1 month given previously identified vitamin A deficiency (end date 5/05) ?Request new measured weight ? ?NUTRITION DIAGNOSIS:  ?Severe Malnutrition related to chronic illness as evidenced by severe fat depletion, severe muscle depletion, percent weight loss (17.9% weight loss in less than 7 months). ?- remains applicable ? ?GOAL:  ?Patient will meet greater than or equal to 90% of their needs ?- progressing, diet in place with good PO intake ? ?MONITOR:  ?PO intake, Supplement acceptance, Labs, Weight trends, Skin, I & O's ? ?REASON FOR ASSESSMENT:  ?Malnutrition Screening Tool, Consult ?Assessment of nutrition requirement/status ? ?ASSESSMENT:  ?56 year old male who presented to the ED on 4/03 with fatigue, weakness, LE edema. PMH of severe malnutrition, HTN, anasarca, GERD, abdominal wall hernia. Pt with recent admission for abdominal hernia with incarceration s/p repair requiring TPN during admission. Pt left AMA from this admission. ? ?Pt working with PT at the first attempted visit and out of room for MRI at the second attempt. ? ?Reviewed chart and intake appears to be improving with 100% of last 8 recorded meals. Routinely accepting juven and MVI. Will continue current nutrition plan for now and follow up on if pt likes magic cup. ? ?Average Meal Intake: ?4/19-4/25: 100% intake x 8 recorded meals ? ?Nutritionally Relevant Medications: ?Scheduled Meds: ? cholestyramine  4 g  Oral BID  ? multivitamin with minerals  1 tablet Oral Daily  ? JUVEN  1 packet Oral BID BM  ? pantoprazole  40 mg Oral Daily  ? vitamin A  10,000 Units Oral Daily  ? Vitamin D (Ergocalciferol)  50,000 Units Oral Q7 days  ? ?Continuous Infusions: ? albumin human 12.5 g (06/01/21 0537)  ? ?PRN Meds:.ondansetron, simethicone ? ?Labs Reviewed: ?Creatinine .46 ? ?Vitamin/Mineral Profile: ?Thiamine B1: 127.7 (WNL) on 4/03 ?Vitamin B12: 929 (H) on 3/06 ?Folate B9: 33.3 (WNL) on 3/17 ?Vitamin A: 10.8 (L) on 3/17 ?Vitamin D: 8.19 (L) on 3/17 ?Vitamin E (alpha tocopherol): 6.4 (L) on 3/17 ?Vitamin E (gamma tocopherol): 2.1 (WNL) on 3/17 ?Copper: 74 (WNL) on 3/17 ?Zinc: 43 (L) on 3/17 ? ?NUTRITION - FOCUSED PHYSICAL EXAM: ?Flowsheet Row Most Recent Value  ?Orbital Region Severe depletion  ?Upper Arm Region Severe depletion  ?Thoracic and Lumbar Region Severe depletion  ?Buccal Region Severe depletion  ?Temple Region Severe depletion  ?Clavicle Bone Region Severe depletion  ?Clavicle and Acromion Bone Region Severe depletion  ?Scapular Bone Region Severe depletion  ?Dorsal Hand Severe depletion  ?Patellar Region Unable to assess  [severe edema to BLE]  ?Anterior Thigh Region Unable to assess  ?Posterior Calf Region Unable to assess  ?Edema (RD Assessment) Severe  [BLE]  ?Hair Reviewed  ?Eyes Reviewed  ?Mouth Reviewed  ?Skin Reviewed  ?Nails Reviewed  ? ?Diet Order:   ?Diet Order   ? ?       ?  Diet regular Room service appropriate? Yes; Fluid consistency: Thin  Diet effective now       ?  ? ?  ?  ? ?  ? ? ?  EDUCATION NEEDS:  ?Education needs have been addressed ? ?Skin:  Skin Assessment: Skin Integrity Issues: ?Skin Integrity Issues:: Stage II, Incisions ?Stage II: L buttock ?Incisions: closed abdomen ? ?Last BM:  4/24 - type 6 ? ?Height:  ?Ht Readings from Last 1 Encounters:  ?05/13/21 5\' 6"  (1.676 m)  ? ? ?Weight:  ?Wt Readings from Last 1 Encounters:  ?05/13/21 65 kg  ? ? ?Ideal Body Weight:  64.5 kg ? ?BMI:  Body mass index  is 23.13 kg/m?. ? ?Estimated Nutritional Needs:  ?Kcal:  2100-2300 ?Protein:  100-115 grams ?Fluid:  >2.0 L ? ? ?Ranell Patrick, RD, LDN ?Clinical Dietitian ?RD pager # available in [place]  ?After hours/weekend pager # available in [place] ?

## 2021-06-01 NOTE — [place] FL2 (Addendum)
?[place] MEDICAID FL2 LEVEL OF CARE SCREENING TOOL  ?  ? ?IDENTIFICATION  ?Patient Name: ?Logan Nelson Birthdate: 05-11-1965 Sex: male Admission Date (Current Location): ?05/10/2021  ?[place] and [place] Number: ? [place] ?  Facility and Address:  ?The . [facility], [place] [address] [place], [place] [address] ?     Provider Number: ?[id number]  ?Attending Physician Name and Address:  ?Raiford Noble [place], DO ? Relative Name and Phone Number:  ?  ?   ?Current Level of Care: ?Hospital Recommended Level of Care: ?[place] Prior Approval Number: ?  ? ?Date Approved/Denied: ?  PASRR Number: ?  [id number] ? ?Discharge Plan: ?SNF ?  ? ?Current Diagnoses: ?Patient Active Problem List  ? Diagnosis Date Noted  ? Pressure injury of sacral region, unstageable ([place]) 05/25/2021  ? Pressure injury of buttock, stage 2 ([place]) 05/14/2021  ? Hypomagnesemia 05/13/2021  ? Vitamin D deficiency 05/13/2021  ? Generalized weakness 05/12/2021  ? Hypophosphatemia 05/12/2021  ? Lower extremity weakness 05/11/2021  ? Hypokalemia 05/11/2021  ? Anemia of chronic disease 05/11/2021  ? GERD (gastroesophageal reflux disease) 05/11/2021  ? Bilateral inguinal hernia 04/23/2021  ? Protein-calorie malnutrition, severe 04/14/2021  ? Protein calorie malnutrition ([place]) 04/13/2021  ? Protein-calorie malnutrition ([place]) 04/12/2021  ? Hepatic steatosis 04/12/2021  ? Stroke [facility]) 04/11/2021  ? Small bowel obstruction ([place]) 10/13/2020  ? Anemia due to vitamin B12 deficiency 10/13/2020  ? Hypertension 01/09/2015  ? 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 ? ?Orientation RESPIRATION BLADDER Height & Weight   ?  ?Self, Time, Situation, Place ? Normal Incontinent Weight: 65 kg ?Height:  5\' 6"  (167.6 cm)  ?BEHAVIORAL SYMPTOMS/MOOD NEUROLOGICAL BOWEL NUTRITION STATUS  ?    Incontinent Diet (Regular with thin liquids)  ?AMBULATORY STATUS COMMUNICATION OF NEEDS Skin   ?Extensive Assist Verbally  (bruisiing to Bilateral arms/ dry skin) ?   ?  ?  ?    ?     ?     ? ? ?Personal Care Assistance Level of Assistance  ?Bathing, Feeding, Dressing Bathing Assistance: Maximum assistance ?Feeding assistance: Limited assistance ?Dressing Assistance: Maximum assistance ?   ? ?Functional Limitations Info  ?Sight, Hearing, Speech Sight Info: Adequate ?Hearing Info: Adequate ?Speech Info: Adequate  ? ? ?SPECIAL CARE FACTORS FREQUENCY  ?PT (By licensed PT), OT (By licensed OT)   ?  ?PT Frequency: 5x/wk ?OT Frequency: 5x/wk ?  ?  ?  ?   ? ? ?Contractures Contractures Info: Not present  ? ? ?Additional Factors Info  ?Code Status, Allergies Code Status Info: Full ?Allergies Info: NKA ?  ?  ?  ?   ? ?Current Medications (06/01/2021):  This is the current hospital active medication list ?Current Facility-Administered Medications  ?Medication Dose Route Frequency Provider Last Rate Last Admin  ? albumin human 25 % solution 12.5 g  12.5 g Intravenous [address] [place], [place] 60 mL/hr at 06/01/21 0537 12.5 g at 06/01/21 0537  ? aspirin chewable tablet 81 mg  81 mg Oral Daily Pokhrel, Laxman, MD   81 mg at 06/01/21 1020  ? cholestyramine (QUESTRAN) packet 4 g  4 g Oral BID Pokhrel, Laxman, MD   4 g at 06/01/21 1019  ? furosemide (LASIX) injection 40 mg  40 mg Intravenous Once Sheikh, Omair Latif, DO      ? multivitamin with minerals tablet 1 tablet  1 tablet Oral Daily Pokhrel, Laxman, MD   1 tablet at 06/01/21 1020  ? nutrition supplement (JUVEN) (JUVEN)  powder packet 1 packet  1 packet Oral BID BM [place] Eric J, DO   1 packet at 06/01/21 1239  ? nystatin (MYCOSTATIN) 100000 UNIT/ML suspension 500,000 Units  5 mL Oral QID Florencia Reasons, MD   500,000 Units at 06/01/21 1239  ? ondansetron (ZOFRAN) injection 4 mg  4 mg Intravenous Q6H PRN Shalhoub, Sherryll Burger, MD   4 mg at 05/26/21 2029  ? oxyCODONE-acetaminophen (PERCOCET/ROXICET) 5-325 MG per tablet 1-2 tablet  1-2 tablet Oral Q6H PRN [place] Eric J, DO   2 tablet at 06/01/21 1238  ? pantoprazole (PROTONIX) EC tablet 40 mg  40  mg Oral Daily Pokhrel, Laxman, MD   40 mg at 06/01/21 1020  ? simethicone (MYLICON) chewable tablet 80 mg  80 mg Oral QID PRN Pokhrel, Laxman, MD   80 mg at 05/16/21 1022  ? vitamin A capsule 10,000 Units  10,000 Units Oral Daily Pokhrel, Laxman, MD   10,000 Units at 06/01/21 1020  ? Vitamin D (Ergocalciferol) (DRISDOL) capsule 50,000 Units  50,000 Units Oral Q7 days Flora Lipps, MD   50,000 Units at 05/26/21 1541  ? ? ? ?Discharge Medications: ?Please see discharge summary for a list of discharge medications. ? ?Relevant Imaging Results: ? ?Relevant Lab Results: ? ? ?Additional Information ?SS#: [id number] is pending ? ?Pollie Friar, RN ? ? ? ? ?

## 2021-06-01 NOTE — Progress Notes (Signed)
RN noted cigarette smoke smell coming from patient's room. RN in room to assess odor. Patient denies smoking but strong odor of cigarette smoke present that wasn't present earlier. Abbe [place], charge RN notified. Phil in to speak with patient. Patient handed over a bag that he had in his bed with him. Cigarettes, a lighter, and a partially smoked cigarette noted. Items retrieved by charge RN and labeled. Placed in patient's chart.  ?

## 2021-06-01 NOTE — Progress Notes (Signed)
?PROGRESS NOTE ? ? ?Logan Nelson  [id number] DOB: 12/17/1965 DOA: 05/10/2021 ?PCP: [name], MD  ? ?Brief Narrative:  ?Logan Nelson is a 56 y.o. male with past medical history of severe protein calorie malnutrition, hypertension, anasarca, abdominal wall hernia presented to the hospital with bilateral lower extremity weakness.  Patient was recently hospitalized for abdominal hernia with incarceration and had a open primary repair but then had left AMA before he was placed in rehab.  Patient was admitted this time with ambulatory dysfunction and bilateral lower extremity weakness.  He is improving slowly but is unsafe discharge disposition as he is homeless and PT OT recommending SNF but currently no bed offers so cannot safely discharge him. ? ?Given his lower extremity swelling and anasarca we will start him on albumin 12.5 g every 8 for 3 days and also give him a dose of IV Lasix and recommend elevation of his extremities and TED hose. Will repeat a Dose of Lasix today given that he continues to have significant LE Edema.  ? ?Blood Count dropped some so will get FOBT. Patient very upset that staff didn't have the time to take him outside yesterday and threatening to leave AMA again if he is not taken outside.  ? ?05/31/2021 Patient's Blood Count dropped further so he was typed and screened and will transfuse 1 unit of pRBCs. Will obtain CT Scan of the Abd/Pelvis and may need to consult General Surgery to evaluate Coccyx Ulcer.  ? ?Assessment and plan ? ?Principal Problem: ?  Lower extremity weakness ?Active Problems: ?  Hypertension ?  Protein-calorie malnutrition, severe ?  Hypokalemia ?  Anemia of chronic disease ?  GERD (gastroesophageal reflux disease) ?  Generalized weakness ?  Hypophosphatemia ?  Hypomagnesemia ?  Vitamin D deficiency ?  Pressure injury of buttock, stage 2 ([place]) ?  Pressure injury of sacral region, unstageable ([place]) ?  ? ?BLE weakness/RUE weakness/ambulatory dysfunction,  deconditioning ?-Thought to be secondary to deconditioning and severe malnutrition.   ?-Seen by neurology.   ?-There was some enhancement on the MRI scan of the spine.  -Neurosurgery was consulted and this looks like nonspecific finding .  No signs of infection.   ?-Vitamin B1 level at 127.  Vitamin B12 at 573.   TSH at 2.975  -Vitamin D level very low at 19.7.   ?-Seen by physical therapy with recommendation of SNF placement. ?-U/A Uremarkable ?-C/w Vitamin D 50,000 units every 7 days ?-TOC for SNF placement for safe discharge disposition however he is a difficult placement given no insurance and homelessness ?-Continue PT/OT to evaluate and Treat and recommending SNF ? ?Severe protein-calorie malnutrition with mild hyperglycemia. ?Hypoalbuminemia  ?-Present on admission.   ?-Nutrition Status: ?Nutrition Problem: Severe Malnutrition ?Etiology: chronic illness ?Signs/Symptoms: severe fat depletion, severe muscle depletion, percent weight loss (17.9% weight loss in less than 7 months) ?Percent weight loss: 17.9 % ?Interventions: MVI, Boost Breeze, Refer to RD note for recommendations, Other (Comment), Magic cup (vitamin D, double protein) ?-Continue supplementation.  Albumin now <1.5 x2 and repeat Albumin being 1.5 x2 and is now 1.7 x3 ?-Initiated Albumin 12.5 mg q8h x 3 again and continue Lasix ?-Will encourage oral nutrition.  Continue Ensure supplements. ?  ?Anasarca, mildly improving  ?-Likely Secondary to severe malnutrition and low albumin.   ?-Encouraged oral intake and C/w Juven 1 packet po BID. ?-Trying some Albumin as above and will give IV Lasix 40 mg x1 again this AM  ?-ECHO done last hospitalization showed EF  of 60-65% and Normal Left Ventricular Diastolic Parameters ?-TSH was 2.975 ?-Initial Urinalysis done on 05/10/21 done and showed no Proteinuria ?-Liver U/S if doesn't improve but obtaining CT Abd/Pelvis First  ?  ?S/p open primary repair of chronically incarcerated ventral incisional hernia, primary  right inguinal hernia repair without mesh, extensive lysis of adhesions on 04/30/2021. ?-Leukocytosis had resolved but is now worsening as it went from 10.1 -> 12.2 x2 -> 12.4 -> 11.1 -> 10.9 ?-Continue dressing changes ?-General Surgery Consulted for further evaluation and Recc's ? ?Diarrhea ?-Negative C. difficile screen.   ?-GI pathogen panel was negative. Could be bile acid associated diarrhea.  He states that his diarrhea started after cholecystectomy.      ?-C/w Lomotil, loperamide as needed  ?-C/w Cholestyramine 4 g p.o. twice daily  ? ?HTN ?-Borderline low with last BP being 145/79 ?-Not on any medications.   ?-Continue to monitor BP's per Protocol  ? ?Anemia of chronic disease ?-Hgb/Hct slowly trending down and went from 10.4/33.0 -> 9.8/31.2 -> 9.3/28.4 -> 9.2/27.7 -> 8.5/25.4 -> 8.2/24.2 -> 8.5/26.5 -> 7.3/21.8 -> 7.2/22.0 -> 6.4/20.6 so was typed and screened and transfused 1 unit of pRBCS on 05/31/21; ?**Now Hgb/Hct has trended up and went from 7.4/23.2 -> 7.7/23.7 ?-Check FOBT given that Hgb/Hct continues to Drop ?-May get CT Abd/Pelvis to evaluate for ? Retroperitoneal Hematoma ?-Anemia Panel Done last Hospitalization showed Iron level of 14, Ferritin Level of 831, and Folate of 33.3 and repeat Iron Level was 6, Ferritin was 890, Folate was 15.8, and Vitamin B12 was 452 ?-Continue to Monitor for S/Sx of Bleeding; No overt bleeding noted ?-Repeat CBC in the AM  ? ?Metabolic Acidosis ?-Mild and improved ?-CO2 is now 22, AG is 4, Chloride Level is now 114 ?-Continue to Monitor and Trend ?-Repeat CMP in the AM  ?    ?GERD ?-C/w PPI with Pantoprazole 40 mg po Daily  ?  ?Hypokalemia ?-Replete during hospitalization.  Potassium is now 3.8 ?-Continue monitor electrolytes and replete as necessary ?-Repeat CMP in the AM ? ?Hypocalcemia ?-Patient's Ca2+ was 7.4 this AM again  ?-Continue to Monitor and Replete as Necessary ? ?Leukocytosis ?-Likely Reactive and was Improving but slightly bumped ?-WBC went from 8.8  -> 13.7 -> 13.0 -> 10.3 -> 10.3 -> 10.1 -> 12.2 x2 -> 12.4 -> 11.1 ?-Continue to Monitor and Trend as Necessary ?-Repeat CBC in the AM  ? ?Hypophosphatemia.   ?-Phosphorus of 2.8 ?-Continue to Monitor and Replete as Necessary ?-Repeat Phos Level in the AM  ? ?Vitamin D Deficiency  ?-Vitamin D level at 19.79.   ?-Continue 50,000 unit vitamin D q. weekly  ? ?Hypomagnesemia.   ?-Mag was 1.9 ?-Continue to Monitor and Replete as necessary  ?-Repeat Mag Level in the AM ? ?Thrombocytosis ?-Likely Reactive  ?-Patient's Platelet Count went from 347 -> 452 -> 409 -> 417 ?-Continue to Monitor and Trend ?-Repeat CBC in the AM  ? ?Coccyx Wound and Pressure Ulcer ?Pressure Injury 05/04/21 Buttocks Left Stage 2 -  Partial thickness loss of dermis presenting as a shallow open injury with a red, pink wound bed without slough. small dime sized skin tear (Active)  ?05/04/21 0800  ?Location: Buttocks  ?Location Orientation: Left  ?Staging: Stage 2 -  Partial thickness loss of dermis presenting as a shallow open injury with a red, pink wound bed without slough.  ?Wound Description (Comments): small dime sized skin tear  ?Present on Admission: No  ?-WOC Nurse consulted and recommending Continue with Aquacel  Advantage secured with sacral foam dressing changing BID. If the wound becomes malodorous or worsens we will consider MediHoney or [employer]. Secretary will order him an air mattress and pressure redistribution chair pad. ?-They are also recommending  "Monitor the wound area(s) for worsening of condition such as: Signs/symptoms of infection,  increase in size, development of or worsening of odor, development of pain, or increased pain at the affected locations." ?-May get CT Scan if continues to worsen but will have General Surgery evaluate first ?   ?Assessment and Plan: ?Pressure injury of sacral region, unstageable ([place]) ?Clean the sacral area with no rinse cleanser. Apply a cut to fit piece of Aquacel Advantage over the wound and  secure with a sacral foam dressing with tip up to help prevent soiling of the wound. Change Aquacel BID. ?-Surgery to see ? ?DVT prophylaxis: Place TED hose Start: 05/28/21 0949 ?Place TED hose Start: 04

## 2021-06-01 NOTE — Progress Notes (Signed)
TR[facility] Cross Cover: ? ?Scheduled CT scan to be delayed as the patient is slow to consume all of the prerequisite oral contrast. ? ? ?Logan Pigg, DO ?Hospitalist ? ?

## 2021-06-01 NOTE — Progress Notes (Signed)
Patient consumed only of contrast needed for CT scan prior to 7pm. Patient would not drink remaining amount. Stated it hurt his stomach. CT tech unsure if enough contrast was consumed for successful scan. Secure chat message sent to Dr. Arlean Hopping. Dr. Arlean Hopping stated in return message for re-evaluation in am.  ?

## 2021-06-01 NOTE — Progress Notes (Signed)
[facility] ?Progress Note ? ?   ?Subjective: ?CC-  ?Patient has been admitted since 05/11/21.  ?Primary team had concerns for possible recurrent hernia, we are asked to evaluate. Patient denies abdominal pain, nausea, vomiting. He is tolerating diet and have loose stools. ? ?Objective: ?Vital signs in last 24 hours: ?Temp:  [98 ?F (36.7 ?C)-98.8 ?F (37.1 ?C)] 98.2 ?F (36.8 ?C) (04/25 1153) ?Pulse Rate:  [63-80] 80 (04/25 1153) ?Resp:  [16-18] 18 (04/25 1153) ?BP: (126-145)/(74-87) 145/79 (04/25 1153) ?SpO2:  [100 %] 100 % (04/25 1153) ?Last BM Date : 05/31/21 ? ?Intake/Output from previous day: ?04/24 0701 - 04/25 0700 ?In: 735 [P.O.:360; Blood:315; IV Piggyback:60] ?Out: 1200 [Urine:1200] ?Intake/Output this shift: ?No intake/output data recorded. ? ?PE: ?Gen:  Alert, NAD ?Pulm:  rate and effort normal ?Abd: soft, protuberant, nontender, right groin incision cdi, midline incision cdi without obvious hernia on exam ? ?Lab Results:  ?Recent Labs  ?  05/31/21 ?0227 05/31/21 ?0909 06/01/21 ?0222  ?WBC 11.1*  --  10.9*  ?HGB 6.4* 7.4* 7.7*  ?HCT 20.6* 23.2* 23.7*  ?PLT 409*  --  417*  ? ?BMET ?Recent Labs  ?  05/31/21 ?0227 06/01/21 ?0222  ?NA 138 137  ?K 3.8 3.5  ?CL 114* 111  ?CO2 21* 22  ?GLUCOSE 87 81  ?BUN 19 18  ?CREATININE 0.48* 0.46*  ?CALCIUM 7.4* 7.4*  ? ?PT/INR ?No results for input(s): LABPROT, INR in the last 72 hours. ?CMP  ?   ?Component Value Date/Time  ? NA 137 06/01/2021 0222  ? NA 143 12/08/2020 1103  ? K 3.5 06/01/2021 0222  ? CL 111 06/01/2021 0222  ? CO2 22 06/01/2021 0222  ? GLUCOSE 81 06/01/2021 0222  ? BUN 18 06/01/2021 0222  ? BUN 14 12/08/2020 1103  ? CREATININE 0.46 (L) 06/01/2021 0222  ? CALCIUM 7.4 (L) 06/01/2021 0222  ? PROT 4.4 (L) 06/01/2021 0222  ? PROT 6.7 12/08/2020 1103  ? ALBUMIN 1.7 (L) 06/01/2021 0222  ? ALBUMIN 3.8 12/08/2020 1103  ? AST 13 (L) 06/01/2021 0222  ? ALT 12 06/01/2021 0222  ? ALKPHOS 80 06/01/2021 0222  ? BILITOT 0.6 06/01/2021 0222  ? BILITOT 0.2  12/08/2020 1103  ? GFRNONAA >60 06/01/2021 0222  ? GFRAA >60 01/23/2019 1523  ? ?Lipase  ?   ?Component Value Date/Time  ? LIPASE 31 05/10/2021 1656  ? ? ? ? ? ?Studies/Results: ?CT ABDOMEN PELVIS WO CONTRAST ? ?Result Date: 06/01/2021 ?CLINICAL DATA:  Abdominal pain, acute, nonlocalized EXAM: CT ABDOMEN AND PELVIS WITHOUT CONTRAST TECHNIQUE: Multidetector CT imaging of the abdomen and pelvis was performed following the standard protocol without IV contrast. RADIATION DOSE REDUCTION: This exam was performed according to the departmental dose-optimization program which includes automated exposure control, adjustment of the mA and/or kV according to patient size and/or use of iterative reconstruction technique. COMPARISON:  CT May 10, 2021. FINDINGS: Lower chest: Small bilateral pleural effusions with adjacent consolidations. Hepatobiliary: Severe hepatic steatosis. Gallbladder is not well visualized. No biliary ductal dilation. Pancreas: No pancreatic ductal dilation. Spleen: No splenomegaly. Adrenals/Urinary Tract: Bilateral adrenal glands are within normal limits. No hydronephrosis. Urinary bladder is unremarkable for degree of distension. Stomach/Bowel: Radiopaque contrast material traverses the distal colon. Bowel loops are not well evaluated secondary to severe anasarca. Gaseous distension of small and large bowel with gas visualized in the rectum. Vascular/Lymphatic: Aortic atherosclerosis without abdominal aortic aneurysm. No pathologically enlarged abdominal or pelvic lymph nodes identified. Reproductive: Prostate is unremarkable. Other: Ascites with severe  diffuse soft tissue anasarca. Postsurgical change of ventral abdominal hernia repair. Musculoskeletal: Multilevel degenerative changes spine. No acute osseous abnormality. 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 IMPRESSION: 1. Gaseous distension of small and large bowel with radiopaque contrast extending into distal  large bowel and gas visualized into the rectum, suggestive of ileus. No evidence of high-grade obstruction. 2. Small volume ascites with severe anasarca common likely reflecting third spacing secondary to hypoproteinemia. 3. Severe hepatic steatosis. 4. Small bilateral pleural effusions with adjacent consolidations which may reflect infiltrate or atelectasis. Aortic Atherosclerosis (ICD10-I70.0). Electronically Signed   By: Dahlia Bailiff M.D.   On: 06/01/2021 14:51   ? ?Anti-infectives: ?Anti-infectives (From admission, onward)  ? ? None  ? ?  ? ? ? ?Assessment/Plan ?Chronically incarcerated complex ventral hernia ?- S/P open primary repair of chronically incarcerated ventral incisional hernia, primary right inguinal hernia repair without mesh, extensive lysis of adhesions 4 hours 04/30/21 - Dr. Thermon Leyland ?- no signs of recurrent hernia on exam or CT images. CT does report persistent ileus, but the patient is tolerating a diet and having bowel function. ?- continue mobilizing, therapies ?- monitor electrolytes and keep Mag>2 and K>4 for bowel function ?- diet as tolerated, encourage protein shakes ? ?Sacral wound ?- Wound does have small amount of drainage but no significant drainage or cellulitis. No indication for acute surgical intervention. Agree with wound care recommendations as per [facility] team.  ? ?We will sign off, please call with questions or concerns.  ? ? LOS: 20 days  ? ? ?Wellington Hampshire, PA-C ?[facility] ?06/01/2021, 3:07 PM ?Please see Amion for pager number during day hours 7:00am-4:30pm ? ?

## 2021-06-01 NOTE — Progress Notes (Signed)
Physical Therapy Treatment ?Patient Details ?Name: Logan Nelson ?MRN: [id number] ?DOB: 05-14-1965 ?Today's Date: 06/01/2021 ? ? ?History of Present Illness Pt is a 52 admitted 4/3 with quickly progressing B LE weakness.  Recently hospitalize with incarcerated abdominal hernia, was repaired, but pt left AMA before he was safe to mobilize,  MRI of brain, cervical/thoracic spine inconclusive.  Work up continues.  PMHx: anemia, arthritis, dyspnea, GERD, HTN, LE edema ? ?  ?PT Comments  ? ? Despite self limiting tendencies pt with improved overall ambulation tolerance/distance to 100' with RW and multiple standing rest breaks. Pt continues with bilat LE edema, distended abdomen, and R UE edema. Pt inquired about  a glove for his R UE, OT notified and to look into. Acute PT to cont to follow. ?   ?Recommendations for follow up therapy are one component of a multi-disciplinary discharge planning process, led by the attending physician.  Recommendations may be updated based on patient status, additional functional criteria and insurance authorization. ? ?Follow Up Recommendations ? Skilled nursing-short term rehab (<3 hours/day) ?  ?  ?Assistance Recommended at Discharge Frequent or constant Supervision/Assistance  ?Patient can return home with the following Assistance with cooking/housework;Direct supervision/assist for medications management;Direct supervision/assist for financial management;Assist for transportation;Help with stairs or ramp for entrance;A little help with walking and/or transfers ?  ?Equipment Recommendations ? Rolling walker (2 wheels)  ?  ?Recommendations for Other Services Rehab consult ? ? ?  ?Precautions / Restrictions Precautions ?Precautions: Fall ?Precaution Comments: bilat LE edema, R UE edema, distending abdomen ?Restrictions ?Weight Bearing Restrictions: No  ?  ? ?Mobility ? Bed Mobility ?Overal bed mobility: Needs Assistance ?Bed Mobility: Rolling ?Rolling: Independent ?Sidelying to sit:  Min assist ?  ?  ?  ?General bed mobility comments: labored effort to roll L/R, HOB flat, pt with BM, dependent for clean up. max diretional verbal cues to use L UE to push up on bed rail vs having PT pull pt up per his request, minA for trunk elevation, pt did use R UE today to help push up with cues ?  ? ?Transfers ?Overall transfer level: Needs assistance ?Equipment used: Rolling walker (2 wheels) ?Transfers: Sit to/from Stand ?Sit to Stand: Min assist ?  ?  ?  ?  ?  ?General transfer comment: pt kept requesting bed to be futher elevated, pt raised some but not as much as pt requested as pt's feet were no longer touching the ground, pt educated on safety and was given max encouragement to use LEs to push up and not raise the bed as high as it goes ?  ? ?Ambulation/Gait ?Ambulation/Gait assistance: Min assist ?Gait Distance (Feet): 100 Feet ?Assistive device: Rolling walker (2 wheels) ?Gait Pattern/deviations: Decreased stride length, Step-to pattern, Decreased step length - right, Decreased step length - left ?Gait velocity: decreased ?Gait velocity interpretation: <1.31 ft/sec, indicative of household ambulator ?  ?General Gait Details: pt requiring 4 standing rest breaks, poor activity tolerance overall however demo'd improved ambulation tolerance/distance ? ? ?Stairs ?  ?  ?  ?  ?  ? ? ?Wheelchair Mobility ?  ? ?Modified Rankin (Stroke Patients Only) ?  ? ? ?  ?Balance Overall balance assessment: Needs assistance ?Sitting-balance support: Feet supported, Bilateral upper extremity supported ?Sitting balance-Leahy Scale: Fair ?  ?  ?Standing balance support: Single extremity supported, During functional activity, Bilateral upper extremity supported ?Standing balance-Leahy Scale: Poor ?Standing balance comment: dependent on RW ?  ?  ?  ?  ?  ?  ?  ?  ?  ?  ?  ?  ? ?  ?  Cognition Arousal/Alertness: Awake/alert ?Behavior During Therapy: [facility] for tasks assessed/performed ?Overall Cognitive Status: No family/caregiver  present to determine baseline cognitive functioning ?Area of Impairment: Problem solving, Safety/judgement, Awareness, Following commands ?  ?  ?  ?  ?  ?  ?  ?  ?  ?Current Attention Level: Selective ?Memory: Decreased recall of precautions ?Following Commands: Follows one step commands inconsistently, Follows one step commands with increased time (behavioral) ?Safety/Judgement: Decreased awareness of safety, Decreased awareness of deficits ?Awareness: Emergent ?Problem Solving: Requires verbal cues, Requires tactile cues ?General Comments: pt with self limiting tendencies, noted decreased safety awareness but resistant to PT's recommendations ?  ?  ? ?  ?Exercises   ? ?  ?General Comments General comments (skin integrity, edema, etc.): pt with BM In the bed, dependent for care ?  ?  ? ?Pertinent Vitals/Pain Pain Assessment ?Pain Assessment: No/denies pain  ? ? ?Home Living   ?  ?  ?  ?  ?  ?  ?  ?  ?  ?   ?  ?Prior Function    ?  ?  ?   ? ?PT Goals (current goals can now be found in the care plan section) Acute Rehab PT Goals ?PT Goal Formulation: With patient ?Time For Goal Achievement: 06/09/21 ?Potential to Achieve Goals: Good ?Progress towards PT goals: Progressing toward goals ? ?  ?Frequency ? ? ? Min 3X/week ? ? ? ?  ?PT Plan Current plan remains appropriate  ? ? ?Co-evaluation   ?  ?  ?  ?  ? ?  ?AM-PAC PT "6 Clicks" Mobility   ?Outcome Measure ? Help needed turning from your back to your side while in a flat bed without using bedrails?: A Little ?Help needed moving from lying on your back to sitting on the side of a flat bed without using bedrails?: A Little ?Help needed moving to and from a bed to a chair (including a wheelchair)?: A Lot ?Help needed standing up from a chair using your arms (e.g., wheelchair or bedside chair)?: A Lot ?Help needed to walk in hospital room?: A Lot ?Help needed climbing 3-5 steps with a railing? : Total ?6 Click Score: 13 ? ?  ?End of Session Equipment Utilized During  Treatment: Gait belt ?Activity Tolerance: Other (comment) (self limiting) ?Patient left: with call bell/phone within reach;in chair;with chair alarm set ?Nurse Communication: Mobility status ?PT Visit Diagnosis: Other abnormalities of gait and mobility (R26.89);Muscle weakness (generalized) (M62.81);History of falling (Z91.81) ?Pain - part of body: Leg;Ankle and joints of foot ?  ? ? ?Time: 1140-1210 ?PT Time Calculation (min) (ACUTE ONLY): 30 min ? ?Charges:  $Gait Training: 8-22 mins ?$Therapeutic Activity: 8-22 mins          ?          ? ?Kittie Plater, PT, DPT ?Acute Rehabilitation Services ?Secure chat preferred ?Office #: [phone] ? ? ? ?Logan Nelson ?06/01/2021, 1:16 PM ? ?

## 2021-06-02 DIAGNOSIS — R197 Diarrhea, unspecified: Secondary | ICD-10-CM

## 2021-06-02 DIAGNOSIS — K567 Ileus, unspecified: Secondary | ICD-10-CM

## 2021-06-02 DIAGNOSIS — R601 Generalized edema: Principal | ICD-10-CM | POA: Diagnosis present

## 2021-06-02 LAB — COMPREHENSIVE METABOLIC PANEL
ALT: 11 U/L (ref 0–44)
AST: 13 U/L — ABNORMAL LOW (ref 15–41)
Albumin: 1.8 g/dL — ABNORMAL LOW (ref 3.5–5.0)
Alkaline Phosphatase: 76 U/L (ref 38–126)
Anion gap: 5 (ref 5–15)
BUN: 15 mg/dL (ref 6–20)
CO2: 21 mmol/L — ABNORMAL LOW (ref 22–32)
Calcium: 7.5 mg/dL — ABNORMAL LOW (ref 8.9–10.3)
Chloride: 112 mmol/L — ABNORMAL HIGH (ref 98–111)
Creatinine, Ser: 0.53 mg/dL — ABNORMAL LOW (ref 0.61–1.24)
GFR, Estimated: >60 mL/min (ref >60)
Glucose, Bld: 106 mg/dL — ABNORMAL HIGH (ref 70–99)
Potassium: 3.2 mmol/L — ABNORMAL LOW (ref 3.5–5.1)
Sodium: 138 mmol/L (ref 135–145)
Total Bilirubin: 0.6 mg/dL (ref 0.3–1.2)
Total Protein: 4.6 g/dL — ABNORMAL LOW (ref 6.5–8.1)

## 2021-06-02 LAB — CBC WITH DIFFERENTIAL/PLATELET
Abs Immature Granulocytes: 0.04 10*3/uL (ref 0.00–0.07)
Basophils Absolute: 0 10*3/uL (ref 0.0–0.1)
Basophils Relative: 0 %
Eosinophils Absolute: 0.1 10*3/uL (ref 0.0–0.5)
Eosinophils Relative: 1 %
HCT: 23.5 % — ABNORMAL LOW (ref 39.0–52.0)
Hemoglobin: 7.7 g/dL — ABNORMAL LOW (ref 13.0–17.0)
Immature Granulocytes: 0 %
Lymphocytes Relative: 14 %
Lymphs Abs: 1.2 10*3/uL (ref 0.7–4.0)
MCH: 30.3 pg (ref 26.0–34.0)
MCHC: 32.8 g/dL (ref 30.0–36.0)
MCV: 92.5 fL (ref 80.0–100.0)
Monocytes Absolute: 0.9 10*3/uL (ref 0.1–1.0)
Monocytes Relative: 10 %
Neutro Abs: 6.6 10*3/uL (ref 1.7–7.7)
Neutrophils Relative %: 75 %
Platelets: 391 10*3/uL (ref 150–400)
RBC: 2.54 MIL/uL — ABNORMAL LOW (ref 4.22–5.81)
RDW: 15.4 % (ref 11.5–15.5)
WBC: 9 10*3/uL (ref 4.0–10.5)
nRBC: 0 % (ref 0.0–0.2)

## 2021-06-02 LAB — PHOSPHORUS: Phosphorus: 2.6 mg/dL (ref 2.5–4.6)

## 2021-06-02 LAB — MAGNESIUM: Magnesium: 1.6 mg/dL — ABNORMAL LOW (ref 1.7–2.4)

## 2021-06-02 MED ORDER — MAGNESIUM SULFATE 2 GM/50ML IV SOLN
2.0000 g | Freq: Once | INTRAVENOUS | Status: AC
  Administered 2021-06-02: 2 g via INTRAVENOUS
  Filled 2021-06-02: qty 50

## 2021-06-02 MED ORDER — POTASSIUM CHLORIDE CRYS ER 20 MEQ PO TBCR
40.0000 meq | EXTENDED_RELEASE_TABLET | Freq: Once | ORAL | Status: AC
Start: 2021-06-02 — End: 2021-06-02
  Administered 2021-06-02: 40 meq via ORAL
  Filled 2021-06-02: qty 2

## 2021-06-02 NOTE — Plan of Care (Signed)

## 2021-06-02 NOTE — Consult Note (Addendum)
? ?                                                                           Gastroenterology Consult: ?2:45 PM ?06/02/2021 ? LOS: 21 days  ? ? ?Referring Provider: Dr Irine Seal.    ?Primary Care Physician:  [name], MD ?Primary Gastroenterologist:  unassigned  ? ? ? ?Reason for Consultation:  Anemia.  Malnutrition ?  ?HPI: Logan Nelson is a 56 y.o. male.  PMH  ?Anemia (Hgb 6.4) in 10/2020, 2 PRBC.  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  Colostomy eventually reversed.  No records exist of this problem.  Do not find previous colonoscopy.   ? ?Admission 3/17-3/29/2023 when he left AMA.  Underwent repair of complex ventral hernia and extensive LOA for incarcerated hernia on 3/24.  Presumed to have aspirated and required brief stay in the ICU, brief vent support.  Issues with anorexia, malnutrition, anemia (Hgb nadir 7.8) requiring  4 PRBC. ?SNF placement recommended but patient left AMA. ? ?Return to the ED 4 days ago with lower extremity weakness.  After neuro work-up this is felt to be due to severe deconditioning and malnutrition. ?Anemia and dropping Hgb persists.  Hgb 11.5 at admission, 6.4 2 days ago.. 1 PRBC.. 7.7 today.   ?Iron 6, ferritin 890.  TIBC and iron sats not calculated.  Folate/12 okay ?Prealbumin 6.2.  LFTs not elevated. ?Potassium 3.2.  BUN/creatinine, GFR not compromised. ? ?Concern for recurrent hernia.  Surgery evaluated the patient yesterday and does not feel he has a recurrent hernia based on CT or exam but does have an ileus.  Small amount of nonpurulent drainage noted from a sacral wound. ?Stools are diarrheal but C. difficile screening and pathogen panel negative.  Being treated with Lomotil, loperamide and cholestyramine.  Patient has had a couple of stools in the last day which the nurse describes as yellow/brown, pasty and not  bloody. ?Patient denies significant abdominal pain though he has some tenderness around surgical site. ?Patient is now eating 100% of his meals consistently. ? ?CTAP without contrast reveals distention of small and large bowel extending to distal large bowel suggesting ileus but no high-grade obstruction.  There is severe anasarca and small volume ascites, severe hepatic steatosis.  Small pleural effusions and adjacent consolidations reflecting infiltrate or atelectasis. ?  ?Patient is not a heavy drinker.  He rarely drinks a beer but that is it.  Had recently been staying with his grandfather who is 68 and not able to take care of the patient.  Patient has a 100 year old daughter. ? ?Past Medical History:  ?Diagnosis Date  ? Anemia   ? Arthritis   ? Bradycardia   ? Dyspnea   ? Falling episodes   ? GERD (gastroesophageal reflux disease)   ? Hernia of abdominal wall   ? Hypertension   ? Lower extremity edema   ? ? ?Past Surgical History:  ?Procedure Laterality Date  ? ABDOMINAL SURGERY    ? INGUINAL HERNIA REPAIR Right 04/30/2021  ? Procedure: HERNIA REPAIR INGUINAL WITH MESH;  Surgeon: Stechschulte, Nickola Major, MD;  Location: [facility];  Service: General;  Laterality: Right;  ? LYSIS OF ADHESION  04/30/2021  ?  Procedure: LYSIS OF ADHESION;  Surgeon: Felicie Morn, MD;  Location: [facility];  Service: General;;  ? UMBILICAL HERNIA REPAIR N/A 04/30/2021  ? Procedure: OPEN UMBILICAL AND EPIGASTRIC HERNIA REPAIR;  Surgeon: Felicie Morn, MD;  Location: [facility];  Service: General;  Laterality: N/A;  ? ? ?Prior to Admission medications   ?Medication Sig Start Date End Date Taking? Authorizing Provider  ?feeding supplement (ENSURE ENLIVE / ENSURE PLUS) LIQD Take 237 mLs by mouth 3 (three) times daily between meals. ?Patient not taking: Reported on 04/23/2021 04/14/21   Lacinda Axon, MD  ?ondansetron (ZOFRAN) 4 MG tablet Take 1 tablet (4 mg total) by mouth every 8 (eight) hours as needed for nausea or vomiting. ?Patient not  taking: Reported on 04/23/2021 04/14/21 04/14/22  Lacinda Axon, MD  ? ? ?Scheduled Meds: ? aspirin  81 mg Oral Daily  ? cholestyramine  4 g Oral BID  ? feeding supplement  1 Container Oral TID BM  ? multivitamin with minerals  1 tablet Oral Daily  ? nutrition supplement (JUVEN)  1 packet Oral BID BM  ? nystatin  5 mL Oral QID  ? pantoprazole  40 mg Oral Daily  ? vitamin A  10,000 Units Oral Daily  ? Vitamin D (Ergocalciferol)  50,000 Units Oral Q7 days  ? ?Infusions: ? ?PRN Meds: ?ondansetron (ZOFRAN) IV, oxyCODONE-acetaminophen, simethicone ? ? ?Allergies as of 05/10/2021  ? (No Known Allergies)  ? ? ?No family history on file. ? ?Social History  ? ?Socioeconomic History  ? Marital status: Single  ?  Spouse name: Not on file  ? Number of children: Not on file  ? Years of education: Not on file  ? Highest education level: Not on file  ?Occupational History  ? Not on file  ?Tobacco Use  ? Smoking status: Former  ?  Types: Cigarettes  ?  Quit date: 04/28/2020  ?  Years since quitting: 1.0  ? Smokeless tobacco: Never  ?Vaping Use  ? Vaping Use: Never used  ?Substance and Sexual Activity  ? Alcohol use: Not Currently  ? Drug use: Not Currently  ? Sexual activity: Not Currently  ?Other Topics Concern  ? Not on file  ?Social History Narrative  ? ** Merged History Encounter **  ?    ? ?Social Determinants of Health  ? ?Financial Resource Strain: Not on file  ?Food Insecurity: Not on file  ?Transportation Needs: Not on file  ?Physical Activity: Not on file  ?Stress: Not on file  ?Social Connections: Not on file  ?Intimate Partner Violence: Not on file  ? ? ?REVIEW OF SYSTEMS: ?Constitutional: Weakness ?ENT:  No nose bleeds ?Pulm: Does not feel short of breath.  Not coughing ?CV:  No palpitations, no angina.  Swelling of limbs. ?GU:  No hematuria, no frequency ?GI: See HPI. ?Heme: Denies unusual or excessive bleeding or bruising. ?Transfusions: See HPI. ?Neuro:  No headaches, no peripheral tingling or numbness ?Derm:  No  itching, no rash or sores.  ?Endocrine:  No sweats or chills.  No polyuria or dysuria ?Immunization: reviewed. ?Travel:  None beyond local counties in last few months.  ? ? ?PHYSICAL EXAM: ?Vital signs in last 24 hours: ?Vitals:  ? 06/02/21 0823 06/02/21 1205  ?BP: (!) 150/93 (!) 158/103  ?Pulse: 69 77  ?Resp: 18 13  ?Temp: 98 ?F (36.7 ?C) 98.9 ?F (37.2 ?C)  ?SpO2: 98% 99%  ? ?Wt Readings from Last 3 Encounters:  ?05/13/21 65 kg  ?04/30/21 63.8  kg  ?04/09/21 64.4 kg  ? ? ?General: Chronically ill, malnourished appearing looks older than stated age. ?Head: No facial asymmetry or swelling. ?Eyes: Conjunctiva pale.  No scleral icterus ?Ears: Not hard of hearing ?Nose: No discharge or congestion ?Mouth: Poor dentition, about 7 incisors remain.  Mucosa is pink, moist, clear.  Tongue midline ?Neck: No JVD, no masses, no thyromegaly ?Lungs: Diminished at bases but no adventitious sounds.  Lungs clear.  No labored breathing or cough during interview. ?Heart: RRR.  No MRG.  S1, S2 present ?Abdomen: Soft, distended in an asymmetric manner with more swelling noted on the mid to right lower abdomen than elsewhere.  Mild tenderness along the incision line which is well-healed without obvious drainage.  No erythema.   ?Rectal: Not performed ?Musc/Skeltl: No joint swelling or redness.  Limbs are thin. ?Extremities: Anasarca with edema of all 4 limbs. ?Neurologic: Alert.  Oriented x3.  Rambling historian.  Able to move all 4 limbs, strength not tested.  No tremors observed ?Skin: No rash, no sores on incomplete dermatologic survey. ?Nodes: No cervical adenopathy ?Psych: Animated, somewhat labile speech pattern but cooperative. ? ?Intake/Output from previous day: ?04/25 0701 - 04/26 0700 ?In: 756.2 [P.O.:588; IV Piggyback:168.2] ?Out: 2400 [Urine:2400] ?Intake/Output this shift: ?Total I/O ?In: 480 [P.O.:480] ?Out: -  ? ?LAB RESULTS: ?Recent Labs  ?  05/31/21 ?0227 05/31/21 ?0909 06/01/21 ?0222 06/02/21 ?0229  ?WBC 11.1*  --  10.9*  9.0  ?HGB 6.4* 7.4* 7.7* 7.7*  ?HCT 20.6* 23.2* 23.7* 23.5*  ?PLT 409*  --  417* 391  ? ?BMET ?Lab Results  ?Component Value Date  ? NA 138 06/02/2021  ? NA 137 06/01/2021  ? NA 138 05/31/2021  ? K 3.2 (L) 04/26/202

## 2021-06-02 NOTE — Progress Notes (Signed)
[facility] night cross cover note: ? ?I was notified by RN of the patient's updated potassium level of 3.2 this morning, which is relative to most recent prior value of 3.5 yesterday morning. this morning's labs are also notable for serum creatinine of 0.53.  Additionally, serum magnesium level checked this morning found to be 1.6, which is relative to most recent prior value of 1.9 yesterday morning. ? ?I subsequently placed orders for potassium chloride 40 mEq p.o. x1 dose now as well as magnesium sulfate 2 g IV over 2 hours x1 dose now. ? ? ? ?Newton Pigg, DO ?Hospitalist ? ?

## 2021-06-02 NOTE — Progress Notes (Addendum)
?PROGRESS NOTE ? ? ? ?Logan Nelson  [id number] DOB: 02-09-1965 DOA: 05/10/2021 ?PCP: [name], MD  ? ? ?Chief Complaint  ?Patient presents with  ? Leg Swelling  ? ? ?Brief Narrative:  ?Logan Nelson is a 56 y.o. male with past medical history of severe protein calorie malnutrition, hypertension, anasarca, abdominal wall hernia presented to the hospital with bilateral lower extremity weakness.  Patient was recently hospitalized for abdominal hernia with incarceration and had a open primary repair but then had left AMA before he was placed in rehab.  Patient was admitted this time with ambulatory dysfunction and bilateral lower extremity weakness.  He is improving slowly but is unsafe discharge disposition as he is homeless and PT OT recommending SNF but currently no bed offers so cannot safely discharge him. ?  ?Given his lower extremity swelling and anasarca we will start him on albumin 12.5 g every 8 for 3 days and also give him a dose of IV Lasix and recommend elevation of his extremities and TED hose. Will repeat a Dose of Lasix today given that he continues to have significant LE Edema.  ?  ?Blood Count dropped some so will get FOBT. Patient very upset that staff didn't have the time to take him outside yesterday and threatening to leave AMA again if he is not taken outside.  ?  ?05/31/2021 Patient's Blood Count dropped further so he was typed and screened and will transfuse 1 unit of pRBCs. Will obtain CT Scan of the Abd/Pelvis and may need to consult General Surgery to evaluate Coccyx Ulcer.  ?  ? ? ?Assessment & Plan: ?  ?Principal Problem: ?  Lower extremity weakness ?Active Problems: ?  Hypertension ?  Protein-calorie malnutrition, severe ?  Hypokalemia ?  Anemia of chronic disease ?  GERD (gastroesophageal reflux disease) ?  Generalized weakness ?  Hypophosphatemia ?  Hypomagnesemia ?  Vitamin D deficiency ?  Pressure injury of buttock, stage 2 ([place]) ?  Pressure injury of sacral region,  unstageable ([place]) ?  Anasarca ? ?#1 bilateral lower extremity weakness/right upper extremity weakness/ambulatory dysfunction/deconditioning ?-Felt likely secondary to deconditioning and severe malnutrition. ?-Patient seen by neurology who feel patient's symptoms secondary to deconditioning and severe malnutrition and recommending medical management. ?-MRI scan done of the spine was concerning for some enhancement, neurosurgery consulted and felt nonspecific finding. ?-Patient with no signs of infection. ?-Urinalysis unremarkable. ?-Vitamin B1 level of 127.7, vitamin D level of 19.9 (low), vitamin a level of 10.8(low), vitamin B12 levels of 452, vitamin D level of 6.4 (low). ?-Continue vitamin D supplementation 50,000 units every 7 days. ?-Patient seen by PT OT recommending SNF. ?-TOC consulted for SNF placement for safe disposition, difficult placement as patient with no insurance and homeless. ? ?2.  Severe protein calorie malnutrition/hypoalbuminemia, POA ?-Patient on admission presented with bilateral lower extremity weakness, noted to have severe protein calorie malnutrition. ?-Patient with severe fat depletion, severe muscle depletion, percent weight loss of 17.9% and less than 7 months. ?-Patient presented with weakness, anemia, iron level of 6, anasarca, severe protein calorie malnutrition, concern for colonic neoplasm. ?-CT abdomen and pelvis with no acute abnormalities. ?-Consult with GI for further evaluation and management,??  Colonoscopy. ?-Patient received IV albumin and Lasix early on during the hospitalization. ?-Continue Ensure supplementation. ? ?3.  Anasarca ?-Felt likely secondary to severe malnutrition, low albumin level. ?-Patient still with anasarca on examination. ?-Oral intake encouraged. ?-Patient received IV albumin and Lasix. ?-2D echo during last hospitalization with EF of 60  to 65%, NWMA. ?-TSH of 2.975. ?-Urinalysis negative for proteinuria. ?-CT abdomen and pelvis with severe hepatic  steatosis. ?-Continue Juven twice daily, Ensure supplementation. ?-Trial of IV albumin tomorrow with IV Lasix. ?-Monitor urine output. ? ?4.  Status post open primary repair of chronically incarcerated ventral incisional hernia, primary right inguinal hernia repair without mesh, extensive lysis of adhesions on 04/30/2021 ?-Leukocytosis resolved. ?-Patient seen by general surgery (06/01/2021) who assessed patient and feel no signs of recurrent hernia on exam or CT images and recommended mobilization, repletion of electrolytes to keep magnesium > 2, potassium > 4, patient with bowel function. ?-Tolerating current diet, appreciate general surgery input and recommendations. ? ?5.  Diarrhea ?-C. difficile screen negative. ?-GI pathogen panel negative. ?-Continue Lomotil, loperamide as needed, cholestyramine 4 g twice daily. ? ?6.  Hypertension ?-Not on antihypertensive medications. ?-Follow. ? ?7.  Anemia of chronic disease ?-Patient noted to be anemic with hemoglobin dropping as low as 6.4 status post transfusion 1 unit packed red blood cells 05/31/2021 hemoglobin currently stable at 7.7. ?-Patient with no overt bleeding. ?-CT abdomen and pelvis negative for retroperitoneal bleed. ?-Ferritin level elevated. ?-Patient presented with anasarca, severe protein calorie malnutrition, weight loss, iron level of 6. ?-May benefit from IV iron during this hospitalization. ?-??  Concern for colonic neoplasm. ?-Consult with GI for further evaluation and management. ? ?8.  Metabolic acidosis ?-Improved. ? ?9.  Hypokalemia ?-Replete. ? ?10.  Pseudohypocalcemia ?-Corrected calcium at 9.26. ? ?11.  Vitamin D deficiency ?-Continue vitamin D supplementation 50,000 units q. 7 days. ? ?12.  GERD ?-PPI. ? ?13.  Leukocytosis ?-Felt likely reactive. ?-No signs of infection. ?-Follow. ? ?14.  Hypophosphatemia/hypomagnesemia ?-Phosphorus level at 2.6. ?-Magnesium at 1.6. ?-Magnesium sulfate 2 g IV x1 ordered this morning. ? ?15.   Thrombocytosis ?-Likely reactive. ? ?16.  Coccyx wound and pressure ulcer/pressure injury of the sacral region unstageable, POA ?-Patient seen by general surgery and no surgical indication at that time. ?-Continue recommendations by WOC. ?Pressure Injury 05/04/21 Buttocks Left Stage 2 -  Partial thickness loss of dermis presenting as a shallow open injury with a red, pink wound bed without slough. small dime sized skin tear (Active)  ?05/04/21 0800  ?Location: Buttocks  ?Location Orientation: Left  ?Staging: Stage 2 -  Partial thickness loss of dermis presenting as a shallow open injury with a red, pink wound bed without slough.  ?Wound Description (Comments): small dime sized skin tear  ?Present on Admission: No  ? ?  ? ? ? ?DVT prophylaxis: SCDs ?Code Status: Full ?Family Communication: Updated patient.  No family at bedside. ?Disposition:  ? ?Status is: Inpatient ?Remains inpatient appropriate because: unsafe disposition ?  ?Consultants:  ?Neurology: Dr.Arora 05/11/2021 ?Wound care RN Rosalio Loud 05/25/2021 ?General surgery: Dr. Ninfa [name] 06/01/2021 ?Gastroenterology pending ?Neurosurgery: Dr. Christella Noa 05/12/2021 ? ?Procedures:  ?CT head 05/10/2021 ?CT chest abdomen and pelvis 05/10/2021 ?CT abdomen and pelvis 06/01/2021 ?Abdominal films 05/11/2021 ?MRI C-spine 05/10/2021, 05/11/2021 ?MRI T-spine 05/11/2021 ?Transfusion 1 unit packed red blood cells 05/31/2021 ? ? ?Antimicrobials:  ?Anti-infectives (From admission, onward)  ? ? None  ? ?  ?  ? ? ?Subjective: ?Patient sitting up at bedside getting ready to eat his lunch.  Denies any chest pain.  No shortness of breath.  No abdominal pain.  Still with significant weakness.  Patient states passing flatus and having bowel movements. ? ?Objective: ?Vitals:  ? 06/01/21 2331 06/02/21 0309 06/02/21 [id number] 06/02/21 1205  ?BP: 118/70 (!) 134/91 (!) 150/93 (!) 158/103  ?Pulse: 72  75 69 77  ?Resp: 18 18 18 13   ?Temp: 98.6 ?F (37 ?C) 99 ?F (37.2 ?C) 98 ?F (36.7 ?C) 98.9 ?F (37.2 ?C)  ?TempSrc:  Oral  Oral Oral  ?SpO2: 98% 96% 98% 99%  ?Weight:      ?Height:      ? ? ?Intake/Output Summary (Last 24 hours) at 06/02/2021 1516 ?Last data filed at 06/02/2021 [id number] ?Gross per 24 hour  ?Intake 1236.22 ml  ?Output 2400 ml

## 2021-06-02 NOTE — Progress Notes (Signed)
Occupational Therapy Treatment ?Patient Details ?Name: Logan Nelson ?MRN: [id number] ?DOB: 1965-07-07 ?Today's Date: 06/02/2021 ? ? ?History of present illness Pt is a 23 admitted 4/3 with quickly progressing B LE weakness.  Recently hospitalize with incarcerated abdominal hernia, was repaired, but pt left AMA before he was safe to mobilize,  MRI of brain, cervical/thoracic spine inconclusive.  Work up continues.  PMHx: anemia, arthritis, dyspnea, GERD, HTN, LE edema ?  ?OT comments ? Patient up in wheelchair upon arrival and transferred to recliner. Retro grade message performed to RUE hand to address edema with education for patient to perform. Squeeze ball and therapy putty provided to patient with education on exercises for RUE to address weakness and lack of activity. Education on positioning of RUE to address edema. Acute OT to continue to follow.   ? ?Recommendations for follow up therapy are one component of a multi-disciplinary discharge planning process, led by the attending physician.  Recommendations may be updated based on patient status, additional functional criteria and insurance authorization. ?   ?Follow Up Recommendations ? Skilled nursing-short term rehab (<3 hours/day)  ?  ?Assistance Recommended at Discharge Frequent or constant Supervision/Assistance  ?Patient can return home with the following ? A lot of help with walking and/or transfers;A lot of help with bathing/dressing/bathroom;Assist for transportation;Assistance with cooking/housework ?  ?Equipment Recommendations ? BSC/3in1;Tub/shower bench  ?  ?Recommendations for Other Services   ? ?  ?Precautions / Restrictions Precautions ?Precautions: Fall ?Precaution Comments: bilat LE edema, R UE edema, distending abdomen ?Restrictions ?Weight Bearing Restrictions: No  ? ? ?  ? ?Mobility Bed Mobility ?  ?  ?  ?  ?  ?  ?  ?General bed mobility comments: up in wheelchiar upon arrival and transferred to recliner ?  ? ?Transfers ?Overall  transfer level: Needs assistance ?Equipment used: Rolling walker (2 wheels) ?Transfers: Sit to/from Stand ?Sit to Stand: Min assist ?  ?  ?Step pivot transfers: Min assist ?  ?  ?General transfer comment: required cues for safety and hand placement ?  ?  ?Balance Overall balance assessment: Needs assistance ?Sitting-balance support: Feet supported, Bilateral upper extremity supported ?Sitting balance-Leahy Scale: Fair ?  ?  ?Standing balance support: Single extremity supported, During functional activity, Bilateral upper extremity supported ?Standing balance-Leahy Scale: Poor ?Standing balance comment: dependent on RW ?  ?  ?  ?  ?  ?  ?  ?  ?  ?  ?  ?   ? ?ADL either performed or assessed with clinical judgement  ? ?ADL   ?  ?  ?  ?  ?  ?  ?  ?  ?  ?  ?  ?  ?  ?  ?  ?  ?  ?  ?  ?  ?  ? ?Extremity/Trunk Assessment Upper Extremity Assessment ?RUE Deficits / Details: uncoordinated, numb and pt not able to tell how his arm/hand is positioned without cues.Marland Kitchengeneral weakness, but moves laboriously against gravity. ?RUE Sensation: decreased light touch;decreased proprioception ?RUE Coordination: decreased fine motor;decreased gross motor ?LUE Deficits / Details: generally weak overall at >=3/5 ?LUE Sensation: decreased light touch ?  ?  ?  ?  ?  ? ?Vision   ?  ?  ?Perception   ?  ?Praxis   ?  ? ?Cognition Arousal/Alertness: Awake/alert ?Behavior During Therapy: [facility] for tasks assessed/performed ?Overall Cognitive Status: No family/caregiver present to determine baseline cognitive functioning ?Area of Impairment: Problem solving, Safety/judgement, Awareness, Following commands ?  ?  ?  ?  ?  ?  ?  ?  ?  ?  Current Attention Level: Selective ?Memory: Decreased recall of precautions ?Following Commands: Follows one step commands inconsistently, Follows one step commands with increased time ?Safety/Judgement: Decreased awareness of safety, Decreased awareness of deficits ?Awareness: Emergent ?Problem Solving: Requires verbal  cues, Requires tactile cues ?General Comments: declined mobilty but agreeable to address RUE edema ?  ?  ?   ?Exercises Exercises: General Upper Extremity, Other exercises ?General Exercises - Upper Extremity ?Digit Composite Flexion: Squeeze ball, 10 reps ?Other Exercises ?Other Exercises: retro grade message performed to RUE with education to perform ?Other Exercises: therapy putty exercises for grip and pinch strength with RUE ? ?  ?Shoulder Instructions   ? ? ?  ?General Comments    ? ? ?Pertinent Vitals/ Pain       Pain Assessment ?Pain Assessment: Faces ?Faces Pain Scale: Hurts a little bit ?Pain Location: generalized ?Pain Descriptors / Indicators: Grimacing ?Pain Intervention(s): Monitored during session, Repositioned ? ?Home Living   ?  ?  ?  ?  ?  ?  ?  ?  ?  ?  ?  ?  ?  ?  ?  ?  ?  ?  ? ?  ?Prior Functioning/Environment    ?  ?  ?  ?   ? ?Frequency ? Min 2X/week  ? ? ? ? ?  ?Progress Toward Goals ? ?OT Goals(current goals can now be found in the care plan section) ? Progress towards OT goals: Progressing toward goals ? ?Acute Rehab OT Goals ?Patient Stated Goal: go to rehab ?OT Goal Formulation: With patient ?Time For Goal Achievement: 06/11/21 ?Potential to Achieve Goals: Fair ?ADL Goals ?Pt Will Perform Grooming: with supervision;standing ?Pt Will Perform Lower Body Bathing: with min guard assist;sitting/lateral leans;sit to/from stand ?Pt Will Perform Upper Body Dressing: with supervision;sitting ?Pt Will Perform Lower Body Dressing: with min guard assist;sitting/lateral leans;sit to/from stand ?Pt Will Transfer to Toilet: with min assist;stand pivot transfer ?Pt Will Perform Toileting - Clothing Manipulation and hygiene: with min guard assist;sitting/lateral leans;sit to/from stand ?Pt/caregiver will Perform Home Exercise Program: Increased strength;Both right and left upper extremity;With theraband;With Supervision;With written HEP provided ?Additional ADL Goal #1: Pt will tolerate 3 mins of dynamic  standing task with min guard as a precursor for safe ADL performance.  ?Plan Discharge plan remains appropriate   ? ?Co-evaluation ? ? ?   ?  ?  ?  ?  ? ?  ?AM-PAC OT "6 Clicks" Daily Activity     ?Outcome Measure ? ? Help from another person eating meals?: None ?Help from another person taking care of personal grooming?: A Little ?Help from another person toileting, which includes using toliet, bedpan, or urinal?: A Lot ?Help from another person bathing (including washing, rinsing, drying)?: A Lot ?Help from another person to put on and taking off regular upper body clothing?: A Little ?Help from another person to put on and taking off regular lower body clothing?: Total ?6 Click Score: 15 ? ?  ?End of Session Equipment Utilized During Treatment: Rolling walker (2 wheels);Gait belt ? ?OT Visit Diagnosis: Unsteadiness on feet (R26.81);Other abnormalities of gait and mobility (R26.89);Muscle weakness (generalized) (M62.81);History of falling (Z91.81) ?Hemiplegia - Right/Left: Right ?Hemiplegia - dominant/non-dominant: Dominant ?Hemiplegia - caused by: Unspecified ?  ?Activity Tolerance Patient tolerated treatment well ?  ?Patient Left in chair;with call bell/phone within reach;with chair alarm set ?  ?Nurse Communication Mobility status ?  ? ?   ? ?Time: [id number] ?OT Time Calculation (min): 34 min ? ?Charges: OT General  Charges ?$OT Visit: 1 Visit ?OT Treatments ?$Therapeutic Activity: 8-22 mins ?$Therapeutic Exercise: 8-22 mins ? ?Lodema Hong, OTA ?Acute Rehabilitation Services  ?Pager [phone] ?Office [phone] ? ? ?[place] ?06/02/2021, 2:41 PM ?

## 2021-06-03 ENCOUNTER — Inpatient Hospital Stay: Payer: Self-pay

## 2021-06-03 DIAGNOSIS — R601 Generalized edema: Secondary | ICD-10-CM

## 2021-06-03 LAB — CBC WITH DIFFERENTIAL/PLATELET
Abs Immature Granulocytes: 0.04 10*3/uL (ref 0.00–0.07)
Basophils Absolute: 0 10*3/uL (ref 0.0–0.1)
Basophils Relative: 0 %
Eosinophils Absolute: 0.1 10*3/uL (ref 0.0–0.5)
Eosinophils Relative: 1 %
HCT: 22.2 % — ABNORMAL LOW (ref 39.0–52.0)
Hemoglobin: 7.1 g/dL — ABNORMAL LOW (ref 13.0–17.0)
Immature Granulocytes: 0 %
Lymphocytes Relative: 16 %
Lymphs Abs: 1.6 10*3/uL (ref 0.7–4.0)
MCH: 30 pg (ref 26.0–34.0)
MCHC: 32 g/dL (ref 30.0–36.0)
MCV: 93.7 fL (ref 80.0–100.0)
Monocytes Absolute: 1.2 10*3/uL — ABNORMAL HIGH (ref 0.1–1.0)
Monocytes Relative: 12 %
Neutro Abs: 6.7 10*3/uL (ref 1.7–7.7)
Neutrophils Relative %: 71 %
Platelets: 372 10*3/uL (ref 150–400)
RBC: 2.37 MIL/uL — ABNORMAL LOW (ref 4.22–5.81)
RDW: 15.6 % — ABNORMAL HIGH (ref 11.5–15.5)
WBC: 9.6 10*3/uL (ref 4.0–10.5)
nRBC: 0 % (ref 0.0–0.2)

## 2021-06-03 LAB — HEMOGLOBIN AND HEMATOCRIT, BLOOD
HCT: 26.5 % — ABNORMAL LOW (ref 39.0–52.0)
Hemoglobin: 8.7 g/dL — ABNORMAL LOW (ref 13.0–17.0)

## 2021-06-03 LAB — RENAL FUNCTION PANEL
Albumin: 1.7 g/dL — ABNORMAL LOW (ref 3.5–5.0)
Anion gap: 3 — ABNORMAL LOW (ref 5–15)
BUN: 17 mg/dL (ref 6–20)
CO2: 21 mmol/L — ABNORMAL LOW (ref 22–32)
Calcium: 7.4 mg/dL — ABNORMAL LOW (ref 8.9–10.3)
Chloride: 114 mmol/L — ABNORMAL HIGH (ref 98–111)
Creatinine, Ser: 0.4 mg/dL — ABNORMAL LOW (ref 0.61–1.24)
GFR, Estimated: >60 mL/min (ref >60)
Glucose, Bld: 84 mg/dL (ref 70–99)
Phosphorus: 1.8 mg/dL — ABNORMAL LOW (ref 2.5–4.6)
Potassium: 3.4 mmol/L — ABNORMAL LOW (ref 3.5–5.1)
Sodium: 138 mmol/L (ref 135–145)

## 2021-06-03 LAB — MAGNESIUM: Magnesium: 1.7 mg/dL (ref 1.7–2.4)

## 2021-06-03 MED ORDER — POTASSIUM CHLORIDE ER 10 MEQ PO TBCR
40.0000 meq | EXTENDED_RELEASE_TABLET | Freq: Once | ORAL | Status: AC
  Administered 2021-06-03: 40 meq via ORAL
  Filled 2021-06-03 (×2): qty 4

## 2021-06-03 MED ORDER — ALBUMIN HUMAN 25 % IV SOLN: 25.0000 g | Freq: Four times a day (QID) | INTRAVENOUS | Status: DC

## 2021-06-03 MED ORDER — PANTOPRAZOLE SODIUM 40 MG PO TBEC
40.0000 mg | DELAYED_RELEASE_TABLET | Freq: Two times a day (BID) | ORAL | Status: DC
  Administered 2021-06-03 – 2021-06-16 (×25): 40 mg via ORAL
  Filled 2021-06-03 (×26): qty 1

## 2021-06-03 MED ORDER — DEXTROSE 5 % IV SOLN
30.0000 mmol | Freq: Once | INTRAVENOUS | Status: AC
  Administered 2021-06-03: 30 mmol via INTRAVENOUS
  Filled 2021-06-03: qty 10

## 2021-06-03 MED ORDER — FUROSEMIDE 10 MG/ML IJ SOLN
40.0000 mg | Freq: Two times a day (BID) | INTRAMUSCULAR | Status: AC
  Administered 2021-06-03 (×2): 40 mg via INTRAVENOUS
  Filled 2021-06-03 (×2): qty 4

## 2021-06-03 MED ORDER — MAGNESIUM SULFATE 4 GM/100ML IV SOLN
4.0000 g | Freq: Once | INTRAVENOUS | Status: AC
  Administered 2021-06-03: 4 g via INTRAVENOUS
  Filled 2021-06-03: qty 100

## 2021-06-03 MED ORDER — ALBUMIN HUMAN 25 % IV SOLN
25.0000 g | Freq: Four times a day (QID) | INTRAVENOUS | Status: AC
  Administered 2021-06-03 – 2021-06-05 (×6): 25 g via INTRAVENOUS
  Filled 2021-06-03 (×8): qty 100

## 2021-06-03 NOTE — Progress Notes (Signed)
?PROGRESS NOTE ? ? ? ?Logan Nelson  [id number] DOB: 06/26/1965 DOA: 05/10/2021 ?PCP: [name], MD  ? ? ?Chief Complaint  ?Patient presents with  ? Leg Swelling  ? ? ?Brief Narrative:  ?Logan Nelson is a 56 y.o. male with past medical history of severe protein calorie malnutrition, hypertension, anasarca, abdominal wall hernia presented to the hospital with bilateral lower extremity weakness.  Patient was recently hospitalized for abdominal hernia with incarceration and had a open primary repair but then had left AMA before he was placed in rehab.  Patient was admitted this time with ambulatory dysfunction and bilateral lower extremity weakness.  He is improving slowly but is unsafe discharge disposition as he is homeless and PT OT recommending SNF but currently no bed offers so cannot safely discharge him. ?  ?Given his lower extremity swelling and anasarca we will start him on albumin 12.5 g every 8 for 3 days and also give him a dose of IV Lasix and recommend elevation of his extremities and TED hose. Will repeat a Dose of Lasix today given that he continues to have significant LE Edema.  ?  ?Blood Count dropped some so will get FOBT. Patient very upset that staff didn't have the time to take him outside yesterday and threatening to leave AMA again if he is not taken outside.  ?  ?05/31/2021 Patient's Blood Count dropped further so he was typed and screened and will transfuse 1 unit of pRBCs. Will obtain CT Scan of the Abd/Pelvis and may need to consult General Surgery to evaluate Coccyx Ulcer.  ?  ? ? ?Assessment & Plan: ?  ?Principal Problem: ?  Lower extremity weakness ?Active Problems: ?  Hypertension ?  Protein-calorie malnutrition, severe ?  Hypokalemia ?  Anemia of chronic disease ?  GERD (gastroesophageal reflux disease) ?  Generalized weakness ?  Hypophosphatemia ?  Hypomagnesemia ?  Vitamin D deficiency ?  Pressure injury of buttock, stage 2 ([place]) ?  Pressure injury of sacral region,  unstageable ([place]) ?  Anasarca ? ?#1 bilateral lower extremity weakness/right upper extremity weakness/ambulatory dysfunction/deconditioning ?-Felt likely secondary to deconditioning and severe malnutrition. ?-Patient seen by neurology who feel patient's symptoms secondary to deconditioning and severe malnutrition and recommending medical management. ?-MRI scan done of the spine was concerning for some enhancement, neurosurgery consulted and felt nonspecific finding. ?-Patient with no signs of infection. ?-Urinalysis unremarkable. ?-Vitamin B1 level of 127.7, vitamin D level of 19.9 (low), vitamin a level of 10.8(low), vitamin B12 levels of 452, vitamin D level of 6.4 (low). ?-Continue vitamin D supplementation 50,000 units every 7 days. ?-Patient seen by PT OT recommending SNF. ?-TOC consulted for SNF placement for safe disposition, difficult placement as patient with no insurance and homeless. ? ?2.  Severe protein calorie malnutrition/hypoalbuminemia, POA ?-Patient on admission presented with bilateral lower extremity weakness, noted to have severe protein calorie malnutrition. ?-Patient with severe fat depletion, severe muscle depletion, percent weight loss of 17.9% and less than 7 months. ?-Patient presented with weakness, anemia, iron level of 6, anasarca, severe protein calorie malnutrition, concern for colonic neoplasm. ?-CT abdomen and pelvis with no acute abnormalities. ?-Consult with GI for further evaluation and management,??  Colonoscopy. ?-Patient received IV albumin and Lasix early on during the hospitalization. ?-Continue Ensure supplementation. ?-IV albumin every 6 hours x2 days. ? ?3.  Anasarca ?-Felt likely secondary to severe malnutrition, low albumin level. ?-Patient still with anasarca on examination. ?-Oral intake encouraged. ?-Patient received IV albumin and Lasix. ?-2D echo  during last hospitalization with EF of 60 to 65%, NWMA. ?-TSH of 2.975. ?-Urinalysis negative for proteinuria. ?-CT  abdomen and pelvis with severe hepatic steatosis. ?-Continue Juven twice daily, Ensure supplementation. ?-IV albumin every 6 hours x2 days.   ?-Lasix 40 mg IV every 12 hours x2 doses and reassess tomorrow .-monitor urine output. ? ?4.  Status post open primary repair of chronically incarcerated ventral incisional hernia, primary right inguinal hernia repair without mesh, extensive lysis of adhesions on 04/30/2021 ?-Leukocytosis resolved. ?-Patient seen by general surgery (06/01/2021) who assessed patient and feel no signs of recurrent hernia on exam or CT images and recommended mobilization, repletion of electrolytes to keep magnesium > 2, potassium > 4, patient with bowel function. ?-Tolerating current diet, appreciate general surgery input and recommendations. ? ?5.  Diarrhea ?-C. difficile screen negative. ?-GI pathogen panel negative. ?-Continue loperamide as needed, cholestyramine 4 g twice daily. ?-Lomotil discontinued per GI recommendations. ? ?6.  Hypertension ?-Not on antihypertensive medications. ?-Follow. ? ?7.  Anemia of chronic disease ?-Patient noted to be anemic with hemoglobin dropping as low as 6.4 status post transfusion 1 unit packed red blood cells 05/31/2021 hemoglobin currently stable at 7.1 this morning. ?-Repeat H&H this afternoon.. ?-Patient with no overt bleeding. ?-CT abdomen and pelvis negative for retroperitoneal bleed. ?-Ferritin level elevated. ?-Patient presented with anasarca, severe protein calorie malnutrition, weight loss, iron level of 6. ?-May benefit from IV iron during this hospitalization. ?-Transfusion threshold hemoglobin < 7. ?-??  Concern for colonic neoplasm and other etiology of anemia. ?-Patient seen in consultation by gastroenterology who feel colonoscopy and EGD are reasonable during this hospitalization however are recommending reevaluation for colonoscopy and EGD when patient's anasarca, nutritional status have significantly improved and patient is ambulatory, likely  to reconsult GI near the end of his hospital stay.  ?-GI signed off as of 06/03/2021.  ? ?8.  Metabolic acidosis ?-Improved. ? ?9.  Hypokalemia ?-Replete. ? ?10.  Pseudohypocalcemia ?-Corrected calcium at 9.26. ? ?11.  Vitamin D deficiency ?-Continue vitamin D supplementation 50,000 units q. 7 days. ? ?12.  GERD ?-Change PPI to twice daily. ? ?13.  Leukocytosis ?-Felt likely reactive. ?-No signs of infection. ?-Leukocytosis trended down to ?-Follow. ? ?14.  Hypophosphatemia/hypomagnesemia ?-Phosphorus level at 1.8. ?-Magnesium at 1.7. ?-Magnesium sulfate 4 g IV x1. ?-K-Phos 30 mmol IV x1.  ?-K. Dur 40 mEq p.o. x1.  ? ?15.  Thrombocytosis ?-Likely reactive. ? ?16.  Coccyx wound and pressure ulcer/pressure injury of the sacral region unstageable, POA ?-Patient seen by general surgery and no surgical indication at that time. ?-Continue recommendations by WOC. ?Pressure Injury 05/04/21 Buttocks Left Stage 2 -  Partial thickness loss of dermis presenting as a shallow open injury with a red, pink wound bed without slough. small dime sized skin tear (Active)  ?05/04/21 0800  ?Location: Buttocks  ?Location Orientation: Left  ?Staging: Stage 2 -  Partial thickness loss of dermis presenting as a shallow open injury with a red, pink wound bed without slough.  ?Wound Description (Comments): small dime sized skin tear  ?Present on Admission: No  ? ?  ? ? ? ?DVT prophylaxis: SCDs ?Code Status: Full ?Family Communication: Updated patient.  No family at bedside. ?Disposition:  ? ?Status is: Inpatient ?Remains inpatient appropriate because: unsafe disposition ?  ?Consultants:  ?Neurology: Dr.Arora 05/11/2021 ?Wound care RN Rosalio Loud 05/25/2021 ?General surgery: Dr. Ninfa [name] 06/01/2021 ?Gastroenterology: Dr. Fuller Plan 06/02/2021 ?Neurosurgery: Dr. Christella Noa 05/12/2021 ? ?Procedures:  ?CT head 05/10/2021 ?CT chest abdomen and pelvis 05/10/2021 ?CT  abdomen and pelvis 06/01/2021 ?Abdominal films 05/11/2021 ?MRI C-spine 05/10/2021, 05/11/2021 ?MRI T-spine  05/11/2021 ?Transfusion 1 unit packed red blood cells 05/31/2021 ? ? ?Antimicrobials:  ?Anti-infectives (From admission, onward)  ? ? None  ? ?  ?  ? ? ?Subjective: ?Patient sitting up in chair.  Watching telev

## 2021-06-03 NOTE — Progress Notes (Signed)
Physical Therapy Treatment ?Patient Details ?Name: Logan Nelson ?MRN: [id number] ?DOB: 1966/01/28 ?Today's Date: 06/03/2021 ? ? ?History of Present Illness Pt is a 30 admitted 4/3 with quickly progressing B LE weakness.  Recently hospitalize with incarcerated abdominal hernia, was repaired, but pt left AMA before he was safe to mobilize,  MRI of brain, cervical/thoracic spine inconclusive.  Work up continues.  PMHx: anemia, arthritis, dyspnea, GERD, HTN, LE edema ? ?  ?PT Comments  ? ? Pt making steady progress toward goals.  Emphasis today on transfers and progression of gait stability/stamina and quality. ?   ?Recommendations for follow up therapy are one component of a multi-disciplinary discharge planning process, led by the attending physician.  Recommendations may be updated based on patient status, additional functional criteria and insurance authorization. ? ?Follow Up Recommendations ? Skilled nursing-short term rehab (<3 hours/day) ?  ?  ?Assistance Recommended at Discharge Frequent or constant Supervision/Assistance  ?Patient can return home with the following Assistance with cooking/housework;Direct supervision/assist for medications management;Direct supervision/assist for financial management;Assist for transportation;Help with stairs or ramp for entrance;A little help with walking and/or transfers ?  ?Equipment Recommendations ? Rolling walker (2 wheels)  ?  ?Recommendations for Other Services   ? ? ?  ?Precautions / Restrictions Precautions ?Precautions: Fall ?Precaution Comments: bilat LE edema, R UE edema, distending abdomen  ?  ? ?Mobility ? Bed Mobility ?  ?  ?  ?  ?  ?  ?  ?General bed mobility comments: OOB in the chair on arrival and back to chair. ?  ? ?Transfers ?Overall transfer level: Needs assistance ?Equipment used: None ?Transfers: Sit to/from Stand, Bed to chair/wheelchair/BSC ?Sit to Stand: Min assist ?  ?  ?Squat pivot transfers: Mod assist ?  ?  ?General transfer comment: mod  for transfer without warm up and need to quickly get to [facility] ?  ? ?Ambulation/Gait ?Ambulation/Gait assistance: Min assist ?Gait Distance (Feet): 95 Feet ?Assistive device: Rolling walker (2 wheels) ?  ?  ?Gait velocity interpretation: <1.31 ft/sec, indicative of household ambulator ?  ?General Gait Details: mildly unsteady with drif.  cuing for posture, proximity to the RW and assist for stability and 1 episode of disentanglement of RW in obstacle. ? ? ?Stairs ?  ?  ?  ?  ?  ? ? ?Wheelchair Mobility ?  ? ?Modified Rankin (Stroke Patients Only) ?  ? ? ?  ?Balance Overall balance assessment: Needs assistance ?  ?Sitting balance-Leahy Scale: Fair ?  ?  ?Standing balance support: Bilateral upper extremity supported, Single extremity supported, During functional activity ?Standing balance-Leahy Scale: Poor ?Standing balance comment: dependent on RW ?  ?  ?  ?  ?  ?  ?  ?  ?  ?  ?  ?  ? ?  ?Cognition Arousal/Alertness: Awake/alert ?Behavior During Therapy: [facility] for tasks assessed/performed ?Overall Cognitive Status:  (NT formally) ?  ?  ?  ?  ?  ?  ?  ?  ?  ?  ?  ?  ?  ?  ?  ?  ?  ?  ?  ? ?  ?Exercises   ? ?  ?General Comments   ?  ?  ? ?Pertinent Vitals/Pain Pain Assessment ?Pain Assessment: Faces ?Faces Pain Scale: Hurts a little bit ?Pain Location: generalized, R hand ?Pain Intervention(s): Monitored during session  ? ? ?Home Living   ?  ?  ?  ?  ?  ?  ?  ?  ?  ?   ?  ?  Prior Function    ?  ?  ?   ? ?PT Goals (current goals can now be found in the care plan section) Acute Rehab PT Goals ?PT Goal Formulation: With patient ?Time For Goal Achievement: 06/09/21 ?Potential to Achieve Goals: Good ?Progress towards PT goals: Progressing toward goals ? ?  ?Frequency ? ? ? Min 3X/week ? ? ? ?  ?PT Plan Current plan remains appropriate  ? ? ?Co-evaluation   ?  ?  ?  ?  ? ?  ?AM-PAC PT "6 Clicks" Mobility   ?Outcome Measure ? Help needed turning from your back to your side while in a flat bed without using bedrails?: A Little ?Help  needed moving from lying on your back to sitting on the side of a flat bed without using bedrails?: A Little ?Help needed moving to and from a bed to a chair (including a wheelchair)?: A Lot ?Help needed standing up from a chair using your arms (e.g., wheelchair or bedside chair)?: A Little ?Help needed to walk in hospital room?: A Little ?Help needed climbing 3-5 steps with a railing? : Total ?6 Click Score: 15 ? ?  ?End of Session   ?Activity Tolerance: Patient tolerated treatment well;Patient limited by fatigue ?Patient left: in chair;with call bell/phone within reach;with chair alarm set ?Nurse Communication: Mobility status ?PT Visit Diagnosis: Other abnormalities of gait and mobility (R26.89);Muscle weakness (generalized) (M62.81);History of falling (Z91.81) ?  ? ? ?Time: [id number] ?PT Time Calculation (min) (ACUTE ONLY): 17 min ? ?Charges:  $Gait Training: 8-22 mins          ?          ? ?06/03/2021 ? ?Ginger Carne., PT ?Acute Rehabilitation Services ?[phone]  (pager) ?[phone]  (office) ? ? ?Tessie Fass Azaliyah Kennard ?06/03/2021, 6:25 PM ? ?

## 2021-06-03 NOTE — Progress Notes (Signed)
Secure chat with Gena Fray re PICC order.  States pt is refusing PICC placement, agreeable to PIV. ? ?

## 2021-06-03 NOTE — Progress Notes (Addendum)
? ? ? ?Daily Progress Note ? ?Hospital Day: 25 ? ?Chief Complaint: anemia ? ?Brief History ?Logan Nelson is a 56 y.o. male with a pmh not limited to emphysema, previous colostomy for ? Diverticulitis, colostomy takedown. Ventral hernia repair and LOA March 2023, severe hepatic steatosis.  He possibly aspirated, required ICU care and brief vent support. He has problems with malnutrition, anemia requiring transfusion. Transferred to SNF but left AMA.  ? ?ASSESSMENT:  ? ?Chronic [place] anemia.  ?Hgb 7.1. He has received 5 u PRBC since 3/24 but some of the worsening anemia may be been post-op.  ?Iron studies have not suggested iron deficiency. No overt GI blood loss. Suspect anemia related to chronic illness / nutritional deficients and also he had severe B12 deficiency in September 2022 (< 50). B12 normal now ? ?Ventral hernia repair and LOA March 2023 ? ?Ileus by CT scan .  ?No nausea. A few bowel sounds on exam today.  ? ?Diarrhea. Stool studies negative.  ?Getting lomotil, imodium and cholestyramine.  ? ?Malnutrition / Anasarca in setting of severe hypoalbuminemia.  ?Getting Juven and Boost ? ?Severe hepatic steatosis ?   ?PLAN:  ? ?For evaluation of anemia he will eventually need EGD and colonoscopy. Not sure he would be able to tolerate a bowel prep. Plan for colonoscopy and EGD when he is ambulatory and his nutritional status has improved.  ? ? ? ? Attending Physician Note  ? ?I have taken an interval history, reviewed the chart and examined the patient. I performed a substantive portion of this encounter, including complete performance of at least one of the key components, in conjunction with the APP. I agree with the APP's note, impression and recommendations with my edits. My additional impressions and recommendations are as follows.  ? ?*Normocytic anemia - B12 deficiency, post op and suspected ACD  ?*Diarrhea with negative stool studies. Continue cholestyramine. Minimize Imodium d/t ileus. Stop  Lomotil.  ?*Mild ileus  ?*S/P recent ventral hernia repair and LOA  ?*Hepatic steatosis  ?*Malnutrition and anasarca  ? ?Plan to re-evaluate him for colonoscopy and EGD when his anasarca, nutritional status have significantly improved and he is ambulatory. Please re-consult GI near the end of his hospital stay. GI signing off.  ? ?Lucio Edward, MD [facility] ?See [facility], [facility], for our on call provider  ? ? ? ?Subjective: ?No abdominal pain. Stools brown. No overt GI bleeding. No nausea or abdominal pain  ? ? ?Objective ?Imaging:  ? ?March 2023 Echo ?IMPRESSIONS  ? ? 1. Left ventricular ejection fraction, by estimation, is 60 to 65%. The  ?left ventricle has normal function. The left ventricle has no regional  ?wall motion abnormalities. There is mild left ventricular hypertrophy.  ?Left ventricular diastolic parameters  ?were normal.  ? 2. Right ventricular systolic function is normal. The right ventricular  ?size is normal. There is normal pulmonary artery systolic pressure. The estimated right ventricular systolic pressure is 123XX123 mmHg.  ? 3. Left atrial size was mildly dilated.  ? 4. Cannot exclude a flail A2 leaflet. 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  ? 5. The aortic valve is tricuspid. Aortic valve regurgitation is not  ?visualized.  ? 6. Aortic dilatation noted. There is borderline dilatation of the aortic  root, measuring 38 mm.  ? 7. The inferior vena cava is normal in size  with greater than 50%  ?respiratory variability, suggesting right atrial pressure of 3 mmHg.  ? ?CT ABDOMEN PELVIS WO CONTRAST ? ?Result Date: 06/01/2021 ?CLINICAL DATA:  Abdominal pain, acute, nonlocalized EXAM: CT ABDOMEN AND PELVIS WITHOUT CONTRAST TECHNIQUE: Multidetector CT imaging of the abdomen and pelvis was performed following the standard protocol without IV contrast.  RADIATION DOSE REDUCTION: This exam was performed according to the departmental dose-optimization program which includes automated exposure control, adjustment of the mA and/or kV according to patient size and/or use of iterative reconstruction technique. COMPARISON:  CT May 10, 2021. FINDINGS: Lower chest: Small bilateral pleural effusions with adjacent consolidations. Hepatobiliary: Severe hepatic steatosis. Gallbladder is not well visualized. No biliary ductal dilation. Pancreas: No pancreatic ductal dilation. Spleen: No splenomegaly. Adrenals/Urinary Tract: Bilateral adrenal glands are within normal limits. No hydronephrosis. Urinary bladder is unremarkable for degree of distension. Stomach/Bowel: Radiopaque contrast material traverses the distal colon. Bowel loops are not well evaluated secondary to severe anasarca. Gaseous distension of small and large bowel with gas visualized in the rectum. Vascular/Lymphatic: Aortic atherosclerosis without abdominal aortic aneurysm. No pathologically enlarged abdominal or pelvic lymph nodes identified. Reproductive: Prostate is unremarkable. Other: Ascites with severe diffuse soft tissue anasarca. Postsurgical change of ventral abdominal hernia repair. Musculoskeletal: Multilevel degenerative changes spine. No acute osseous abnormality. 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 IMPRESSION: 1. Gaseous distension of small and large bowel with radiopaque contrast extending into distal large bowel and gas visualized into the rectum, suggestive of ileus. No evidence of high-grade obstruction. 2. Small volume ascites with severe anasarca common likely reflecting third spacing secondary to hypoproteinemia. 3. Severe hepatic steatosis. 4. Small bilateral pleural effusions with adjacent consolidations which may reflect infiltrate or atelectasis. Aortic Atherosclerosis (ICD10-I70.0). Electronically Signed   By: Dahlia Bailiff M.D.   On: 06/01/2021  14:51   ? ?Lab Results: ?Recent Labs  ?  06/01/21 ?0222 06/02/21 ?[id number] 06/03/21 ?0221  ?WBC 10.9* 9.0 9.6  ?HGB 7.7* 7.7* 7.1*  ?HCT 23.7* 23.5* 22.2*  ?PLT 417* 391 372  ? ?BMET ?Recent Labs  ?  06/01/21 ?0222 06/02/21 ?0229 06/03/21 ?0221  ?NA 137 138 138  ?K 3.5 3.2* 3.4*  ?CL 111 112* 114*  ?CO2 22 21* 21*  ?GLUCOSE 81 106* 84  ?BUN 18 15 17   ?CREATININE 0.46* 0.53* 0.40*  ?CALCIUM 7.4* 7.5* 7.4*  ? ?LFT ?Recent Labs  ?  06/02/21 ?0229 06/03/21 ?0221  ?PROT 4.6*  --   ?ALBUMIN 1.8* 1.7*  ?AST 13*  --   ?ALT 11  --   ?ALKPHOS 76  --   ?BILITOT 0.6  --   ? ?PT/INR ?No results for input(s): LABPROT, INR in the last 72 hours. ? ? ?Scheduled inpatient medications:  ? aspirin  81 mg Oral Daily  ? cholestyramine  4 g Oral BID  ? feeding supplement  1 Container Oral TID BM  ? furosemide  40 mg Intravenous Q12H  ? multivitamin with minerals  1 tablet Oral Daily  ? nutrition supplement (JUVEN)  1 packet Oral BID BM  ? nystatin  5 mL Oral QID  ? pantoprazole  40 mg Oral BID AC  ? vitamin A  10,000 Units Oral Daily  ? Vitamin D (Ergocalciferol)  50,000 Units Oral Q7 days  ? ?Continuous inpatient infusions:  ? albumin human    ? magnesium sulfate bolus IVPB 4 g (06/03/21 0949)  ? potassium PHOSPHATE IVPB (in mmol)    ? ?PRN inpatient medications: ondansetron (ZOFRAN) IV,  oxyCODONE-acetaminophen, simethicone ? ?Vital signs in last 24 hours: ?Temp:  [98 ?F (36.7 ?C)-98.9 ?F (37.2 ?C)] 98.3 ?F (36.8 ?C) (04/26 2333) ?Pulse Rate:  [77-89] 86 (04/26 2333) ?Resp:  [13-18] 16 (04/26 2333) ?BP: (137-158)/(82-103) 141/82 (04/26 2333) ?SpO2:  [98 %-99 %] 99 % (04/26 2333) ?Last BM Date : 06/02/21 ? ?Intake/Output Summary (Last 24 hours) at 06/03/2021 1029 ?Last data filed at 06/03/2021 [phone] ?Gross per 24 hour  ?Intake 44.52 ml  ?Output 1000 ml  ?Net -955.48 ml  ? ? ? ?Physical Exam:  ?General: Alert male in NAD in bedside chair ?Heart:  Regular rate and rhythm. Anasarca. Pitting edema of all extremities and abdominal wall ?Pulmonary:  Normal respiratory effort ?Abdomen: Soft, distended, nontender. Normal bowel sounds.  ?Neurologic: Alert and oriented ?Psych: Pleasant. Cooperative.  ? ? ?Intake/Output from previous day: ?04/26 0701 - 04/27 0700 ?In:

## 2021-06-04 LAB — CBC WITH DIFFERENTIAL/PLATELET
Abs Immature Granulocytes: 0.07 10*3/uL (ref 0.00–0.07)
Basophils Absolute: 0 10*3/uL (ref 0.0–0.1)
Basophils Relative: 1 %
Eosinophils Absolute: 0.1 10*3/uL (ref 0.0–0.5)
Eosinophils Relative: 1 %
HCT: 20.2 % — ABNORMAL LOW (ref 39.0–52.0)
Hemoglobin: 6.7 g/dL — CL (ref 13.0–17.0)
Immature Granulocytes: 1 %
Lymphocytes Relative: 15 %
Lymphs Abs: 1.3 10*3/uL (ref 0.7–4.0)
MCH: 30 pg (ref 26.0–34.0)
MCHC: 33.2 g/dL (ref 30.0–36.0)
MCV: 90.6 fL (ref 80.0–100.0)
Monocytes Absolute: 1 10*3/uL (ref 0.1–1.0)
Monocytes Relative: 12 %
Neutro Abs: 6.4 10*3/uL (ref 1.7–7.7)
Neutrophils Relative %: 70 %
Platelets: 310 10*3/uL (ref 150–400)
RBC: 2.23 MIL/uL — ABNORMAL LOW (ref 4.22–5.81)
RDW: 15.4 % (ref 11.5–15.5)
WBC: 8.9 10*3/uL (ref 4.0–10.5)
nRBC: 0 % (ref 0.0–0.2)

## 2021-06-04 LAB — RENAL FUNCTION PANEL
Albumin: 2.4 g/dL — ABNORMAL LOW (ref 3.5–5.0)
Anion gap: 4 — ABNORMAL LOW (ref 5–15)
BUN: 20 mg/dL (ref 6–20)
CO2: 23 mmol/L (ref 22–32)
Calcium: 7.6 mg/dL — ABNORMAL LOW (ref 8.9–10.3)
Chloride: 111 mmol/L (ref 98–111)
Creatinine, Ser: 0.51 mg/dL — ABNORMAL LOW (ref 0.61–1.24)
GFR, Estimated: >60 mL/min (ref >60)
Glucose, Bld: 86 mg/dL (ref 70–99)
Phosphorus: 2.5 mg/dL (ref 2.5–4.6)
Potassium: 3.6 mmol/L (ref 3.5–5.1)
Sodium: 138 mmol/L (ref 135–145)

## 2021-06-04 LAB — PREPARE RBC (CROSSMATCH)

## 2021-06-04 LAB — MAGNESIUM: Magnesium: 1.9 mg/dL (ref 1.7–2.4)

## 2021-06-04 MED ORDER — FUROSEMIDE 10 MG/ML IJ SOLN
40.0000 mg | Freq: Once | INTRAMUSCULAR | Status: AC
  Administered 2021-06-04: 40 mg via INTRAVENOUS
  Filled 2021-06-04: qty 4

## 2021-06-04 MED ORDER — DIPHENHYDRAMINE HCL 25 MG PO CAPS
25.0000 mg | ORAL_CAPSULE | Freq: Once | ORAL | Status: AC
  Administered 2021-06-04: 25 mg via ORAL
  Filled 2021-06-04: qty 1

## 2021-06-04 MED ORDER — ACETAMINOPHEN 325 MG PO TABS
650.0000 mg | ORAL_TABLET | Freq: Once | ORAL | Status: AC
  Administered 2021-06-04: 650 mg via ORAL
  Filled 2021-06-04: qty 2

## 2021-06-04 MED ORDER — SODIUM CHLORIDE 0.9% IV SOLUTION: Freq: Once | INTRAVENOUS | Status: AC

## 2021-06-04 MED ORDER — FUROSEMIDE 10 MG/ML IJ SOLN
20.0000 mg | Freq: Once | INTRAMUSCULAR | Status: AC
  Administered 2021-06-04: 20 mg via INTRAVENOUS
  Filled 2021-06-04: qty 4

## 2021-06-04 MED ORDER — POTASSIUM CHLORIDE CRYS ER 20 MEQ PO TBCR
40.0000 meq | EXTENDED_RELEASE_TABLET | Freq: Once | ORAL | Status: AC
  Administered 2021-06-04: 40 meq via ORAL
  Filled 2021-06-04: qty 2

## 2021-06-04 NOTE — Progress Notes (Signed)
PT Cancellation Note ? ?Patient Details ?Name: Logan Nelson ?MRN: [id number] ?DOB: [birth year] ? ? ?Cancelled Treatment:    Reason Eval/Treat Not Completed: Patient declined, no reason specified- Pt. Currently receiving blood transfusion and declines PT because he does not want to move with it in. Pt educated that he can move while receiving transfusion and he continues to deny PT. PT to follow up acutely as able.  ? ?Lorie Apley, SPT ?Acute Rehab Services ? ? ? ?Lorie Apley ?06/04/2021, 3:35 PM ?

## 2021-06-04 NOTE — Progress Notes (Signed)
Occupational Therapy Treatment ?Patient Details ?Name: Logan Nelson ?MRN: [id number] ?DOB: April 15, 1965 ?Today's Date: 06/04/2021 ? ? ?History of present illness Pt is a 45 admitted 4/3 with quickly progressing B LE weakness.  Recently hospitalize with incarcerated abdominal hernia, was repaired, but pt left AMA before he was safe to mobilize,  MRI of brain, cervical/thoracic spine inconclusive.  Work up continues.  PMHx: anemia, arthritis, dyspnea, GERD, HTN, LE edema ?  ?OT comments ? Patient received in bed receiving blood transfusion and asking to continue to work on RUE edema and fine motor. Retro grade massage performed with less edema noted than previous visit.  Fine motor exercises performed with yellow therapy putty and squeeze ball. Patient stating increase use of RUE with opening items since beginning edema management and HEP for [facility]. Acute OT to continue to follow.   ? ?Recommendations for follow up therapy are one component of a multi-disciplinary discharge planning process, led by the attending physician.  Recommendations may be updated based on patient status, additional functional criteria and insurance authorization. ?   ?Follow Up Recommendations ? Skilled nursing-short term rehab (<3 hours/day)  ?  ?Assistance Recommended at Discharge Frequent or constant Supervision/Assistance  ?Patient can return home with the following ? A lot of help with walking and/or transfers;A lot of help with bathing/dressing/bathroom;Assist for transportation;Assistance with cooking/housework ?  ?Equipment Recommendations ? BSC/3in1;Tub/shower bench  ?  ?Recommendations for Other Services   ? ?  ?Precautions / Restrictions Precautions ?Precautions: Fall ?Precaution Comments: bilat LE edema, R UE edema, distending abdomen ?Restrictions ?Weight Bearing Restrictions: No  ? ? ?  ? ?Mobility Bed Mobility ?Overal bed mobility: Needs Assistance ?  ?  ?  ?  ?  ?  ?General bed mobility comments: patient in side lying positon  in bed receiving blood transfusion ?  ? ?Transfers ?  ?  ?  ?  ?  ?  ?  ?  ?  ?General transfer comment: not addressed due to patient recieving blood ?  ?  ?Balance   ?  ?  ?  ?  ?  ?  ?  ?  ?  ?  ?  ?  ?  ?  ?  ?  ?  ?  ?   ? ?ADL either performed or assessed with clinical judgement  ? ?ADL   ?  ?  ?  ?  ?  ?  ?  ?  ?  ?  ?  ?  ?  ?  ?  ?  ?  ?  ?  ?General ADL Comments: focused on edema management and [place] of RUE ?  ? ?Extremity/Trunk Assessment Upper Extremity Assessment ?RUE Deficits / Details: uncoordinated but able to use for functional tasks ?RUE Sensation: decreased light touch;decreased proprioception ?RUE Coordination: decreased fine motor;decreased gross motor ?  ?  ?  ?  ?  ? ?Vision   ?  ?  ?Perception   ?  ?Praxis   ?  ? ?Cognition Arousal/Alertness: Awake/alert ?Behavior During Therapy: [facility] for tasks assessed/performed ?Overall Cognitive Status: No family/caregiver present to determine baseline cognitive functioning ?  ?  ?  ?  ?  ?  ?  ?  ?  ?  ?  ?  ?  ?  ?  ?  ?  ?  ?  ?   ?Exercises Exercises: General Upper Extremity, Other exercises ?General Exercises - Upper Extremity ?Composite Extension: Hand exerciser, Other (comment) (yellow therapy putty) ?Other Exercises ?Other  Exercises: retro grade message performed to RUE with education to perform ? ?  ?Shoulder Instructions   ? ? ?  ?General Comments    ? ? ?Pertinent Vitals/ Pain       Pain Assessment ?Pain Assessment: Faces ?Faces Pain Scale: Hurts a little bit ?Pain Location: generalized, R hand ?Pain Descriptors / Indicators: Grimacing ?Pain Intervention(s): Monitored during session, Repositioned ? ?Home Living   ?  ?  ?  ?  ?  ?  ?  ?  ?  ?  ?  ?  ?  ?  ?  ?  ?  ?  ? ?  ?Prior Functioning/Environment    ?  ?  ?  ?   ? ?Frequency ? Min 2X/week  ? ? ? ? ?  ?Progress Toward Goals ? ?OT Goals(current goals can now be found in the care plan section) ? Progress towards OT goals: Progressing toward goals ? ?Acute Rehab OT Goals ?Patient Stated Goal: get  better ?OT Goal Formulation: With patient ?Time For Goal Achievement: 06/11/21 ?Potential to Achieve Goals: Fair ?ADL Goals ?Pt Will Perform Grooming: with supervision;standing ?Pt Will Perform Lower Body Bathing: with min guard assist;sitting/lateral leans;sit to/from stand ?Pt Will Perform Upper Body Dressing: with supervision;sitting ?Pt Will Perform Lower Body Dressing: with min guard assist;sitting/lateral leans;sit to/from stand ?Pt Will Transfer to Toilet: with min assist;stand pivot transfer ?Pt Will Perform Toileting - Clothing Manipulation and hygiene: with min guard assist;sitting/lateral leans;sit to/from stand ?Pt/caregiver will Perform Home Exercise Program: Increased strength;Both right and left upper extremity;With theraband;With Supervision;With written HEP provided ?Additional ADL Goal #1: Pt will tolerate 3 mins of dynamic standing task with min guard as a precursor for safe ADL performance.  ?Plan Discharge plan remains appropriate   ? ?Co-evaluation ? ? ?   ?  ?  ?  ?  ? ?  ?AM-PAC OT "6 Clicks" Daily Activity     ?Outcome Measure ? ? Help from another person eating meals?: None ?Help from another person taking care of personal grooming?: A Little ?Help from another person toileting, which includes using toliet, bedpan, or urinal?: A Lot ?Help from another person bathing (including washing, rinsing, drying)?: A Lot ?Help from another person to put on and taking off regular upper body clothing?: A Little ?Help from another person to put on and taking off regular lower body clothing?: Total ?6 Click Score: 15 ? ?  ?End of Session   ? ?OT Visit Diagnosis: Unsteadiness on feet (R26.81);Other abnormalities of gait and mobility (R26.89);Muscle weakness (generalized) (M62.81);History of falling (Z91.81) ?Hemiplegia - Right/Left: Right ?Hemiplegia - dominant/non-dominant: Dominant ?Hemiplegia - caused by: Unspecified ?  ?Activity Tolerance Patient tolerated treatment well ?  ?Patient Left in bed;with  call bell/phone within reach ?  ?Nurse Communication Mobility status ?  ? ?   ? ?Time: 1250-1309 ?OT Time Calculation (min): 19 min ? ?Charges: OT General Charges ?$OT Visit: 1 Visit ?OT Treatments ?$Therapeutic Exercise: 8-22 mins ? ?Lodema Hong, OTA ?Acute Rehabilitation Services  ?Pager [phone] ?Office [phone] ? ? ?[facility] ?06/04/2021, 1:40 PM ?

## 2021-06-04 NOTE — Progress Notes (Signed)
?PROGRESS NOTE ? ? ? ?Logan Nelson  [id number] DOB: 11-20-1965 DOA: 05/10/2021 ?PCP: [name], MD  ? ? ?Chief Complaint  ?Patient presents with  ? Leg Swelling  ? ? ?Brief Narrative:  ?Logan Nelson is a 56 y.o. male with past medical history of severe protein calorie malnutrition, hypertension, anasarca, abdominal wall hernia presented to the hospital with bilateral lower extremity weakness.  Patient was recently hospitalized for abdominal hernia with incarceration and had a open primary repair but then had left AMA before he was placed in rehab.  Patient was admitted this time with ambulatory dysfunction and bilateral lower extremity weakness.  He is improving slowly but is unsafe discharge disposition as he is homeless and PT OT recommending SNF but currently no bed offers so cannot safely discharge him. ?  ?Given his lower extremity swelling and anasarca we will start him on albumin 12.5 g every 8 for 3 days and also give him a dose of IV Lasix and recommend elevation of his extremities and TED hose. Will repeat a Dose of Lasix today given that he continues to have significant LE Edema.  ?  ?Blood Count dropped some so will get FOBT. Patient very upset that staff didn't have the time to take him outside yesterday and threatening to leave AMA again if he is not taken outside.  ?  ?05/31/2021 Patient's Blood Count dropped further so he was typed and screened and will transfuse 1 unit of pRBCs. Will obtain CT Scan of the Abd/Pelvis and may need to consult General Surgery to evaluate Coccyx Ulcer.  ?  ? ? ?Assessment & Plan: ?  ?Principal Problem: ?  Lower extremity weakness ?Active Problems: ?  Hypertension ?  Protein-calorie malnutrition, severe ?  Hypokalemia ?  Anemia of chronic disease ?  GERD (gastroesophageal reflux disease) ?  Generalized weakness ?  Hypophosphatemia ?  Hypomagnesemia ?  Vitamin D deficiency ?  Pressure injury of buttock, stage 2 ([place]) ?  Pressure injury of sacral region,  unstageable ([place]) ?  Anasarca ? ?#1 bilateral lower extremity weakness/right upper extremity weakness/ambulatory dysfunction/deconditioning ?-Felt likely secondary to deconditioning and severe malnutrition. ?-Patient seen by neurology who feel patient's symptoms secondary to deconditioning and severe malnutrition and recommending medical management. ?-MRI scan done of the spine was concerning for some enhancement, neurosurgery consulted and felt nonspecific finding. ?-Patient with no signs of infection. ?-Urinalysis unremarkable. ?-Vitamin B1 level of 127.7, vitamin D level of 19.9 (low), vitamin a level of 10.8(low), vitamin B12 levels of 452, vitamin D level of 6.4 (low). ?-Continue vitamin D supplementation 50,000 units every 7 days. ?-Patient seen by PT OT recommending SNF. ?-TOC consulted for SNF placement for safe disposition, difficult placement as patient with no insurance and homeless. ? ?2.  Severe protein calorie malnutrition/hypoalbuminemia, POA ?-Patient on admission presented with bilateral lower extremity weakness, noted to have severe protein calorie malnutrition. ?-Patient with severe fat depletion, severe muscle depletion, percent weight loss of 17.9% and less than 7 months. ?-Patient presented with weakness, anemia, iron level of 6, anasarca, severe protein calorie malnutrition, concern for colonic neoplasm. ?-CT abdomen and pelvis with no acute abnormalities. ?-Consult with GI for further evaluation and management,??  Colonoscopy. ?-Patient received IV albumin and Lasix early on during the hospitalization. ?-Continue Ensure supplementation. ?-IV albumin every 6 hours x2 days. ? ?3.  Anasarca ?-Felt likely secondary to severe malnutrition, low albumin level. ?-Patient still with anasarca on examination. ?-Oral intake encouraged. ?-Patient received IV albumin and Lasix. ?-2D echo  during last hospitalization with EF of 60 to 65%, NWMA. ?-TSH of 2.975. ?-Urinalysis negative for proteinuria. ?-CT  abdomen and pelvis with severe hepatic steatosis. ?-Continue Juven twice daily, Ensure supplementation. ?-Continue IV albumin every 6 hours x2 days.   ?-Patient with urine output of 5.1 L over the past 24 hours. ?-Patient receiving transfusion of packed red blood cells and will get IV Lasix in between transfusions and posttransfusion. ?-Monitor urine output. ? ?4.  Status post open primary repair of chronically incarcerated ventral incisional hernia, primary right inguinal hernia repair without mesh, extensive lysis of adhesions on 04/30/2021 ?-Leukocytosis resolved. ?-Patient seen by general surgery (06/01/2021) who assessed patient and feel no signs of recurrent hernia on exam or CT images and recommended mobilization, repletion of electrolytes to keep magnesium > 2, potassium > 4, patient with bowel function. ?-Tolerating current diet, appreciate general surgery input and recommendations. ? ?5.  Diarrhea ?-C. difficile screen negative. ?-GI pathogen panel negative. ?-Continue loperamide as needed, cholestyramine 4 g twice daily. ?-Lomotil discontinued per GI recommendations. ? ?6.  Hypertension ?-Not on antihypertensive medications. ?-Follow. ? ?7.  Anemia of chronic disease ?-Patient noted to be anemic with hemoglobin dropping as low as 6.4 status post transfusion 1 unit packed red blood cells 05/31/2021 hemoglobin currently at 6.7 this morning.   ?-Patient denies any overt bleeding.  ?-CT abdomen and pelvis negative for retroperitoneal bleed. ?-Ferritin level elevated. ?-Patient presented with anasarca, severe protein calorie malnutrition, weight loss, iron level of 6. ?-May benefit from IV iron during this hospitalization. ?-Transfused 2 units packed red blood cells. ?-Transfusion threshold hemoglobin < 7. ?-??  Concern for colonic neoplasm and other etiology of anemia. ?-Patient seen in consultation by gastroenterology who feel colonoscopy and EGD are reasonable during this hospitalization however are  recommending reevaluation for colonoscopy and EGD when patient's anasarca, nutritional status have significantly improved and patient is ambulatory, likely to reconsult GI near the end of his hospital stay.  ?-GI signed off as of 06/03/2021.  ? ?8.  Metabolic acidosis ?-Improved. ? ?9.  Hypokalemia ?-Replete. ? ?10.  Pseudohypocalcemia ?-Corrected calcium at 9.26. ? ?11.  Vitamin D deficiency ?-Continue vitamin D supplementation 50,000 units q. 7 days. ? ?12.  GERD ?-Continue PPI twice daily.  ? ?13.  Leukocytosis ?-Felt likely reactive. ?-No signs of infection. ?-Leukocytosis trended down to ?-Follow. ? ?14.  Hypophosphatemia/hypomagnesemia ?-Likely secondary to protein calorie malnutrition.   ?- Phosphorus level at 2.5. ?-Magnesium at 1.9. ?-Potassium at 3.6. ?-K-Dur 40 mEq p.o. x1. ?-Repeat labs in the morning. ? ?15.  Thrombocytosis ?-Likely reactive. ? ?16.  Coccyx wound and pressure ulcer/pressure injury of the sacral region unstageable, POA ?-Patient seen by general surgery and no surgical indication at that time. ?-Continue recommendations by WOC. ?Pressure Injury 05/04/21 Buttocks Left Stage 2 -  Partial thickness loss of dermis presenting as a shallow open injury with a red, pink wound bed without slough. small dime sized skin tear (Active)  ?05/04/21 0800  ?Location: Buttocks  ?Location Orientation: Left  ?Staging: Stage 2 -  Partial thickness loss of dermis presenting as a shallow open injury with a red, pink wound bed without slough.  ?Wound Description (Comments): small dime sized skin tear  ?Present on Admission: No  ? ?  ? ? ? ?DVT prophylaxis: SCDs ?Code Status: Full ?Family Communication: Updated patient.  No family at bedside. ?Disposition:  ? ?Status is: Inpatient ?Remains inpatient appropriate because: unsafe disposition ?  ?Consultants:  ?Neurology: Dr.Arora 05/11/2021 ?Wound care RN Rosalio Loud  05/25/2021 ?General surgery: Dr. Ninfa [name] 06/01/2021 ?Gastroenterology: Dr. Fuller Plan  06/02/2021 ?Neurosurgery: Dr. Christella Noa 05/12/2021 ? ?Procedures:  ?CT head 05/10/2021 ?CT chest abdomen and pelvis 05/10/2021 ?CT abdomen and pelvis 06/01/2021 ?Abdominal films 05/11/2021 ?MRI C-spine 05/10/2021, 05/11/2021 ?MRI T-spine 05/11/2021 ?Rona Ravens

## 2021-06-05 LAB — TYPE AND SCREEN
ABO/RH(D): O POS
Antibody Screen: NEGATIVE
Unit division: 0
Unit division: 0

## 2021-06-05 LAB — BPAM RBC
Blood Product Expiration Date: 202305302359
Blood Product Expiration Date: 202305302359
ISSUE DATE / TIME: 202304281100
ISSUE DATE / TIME: 202304281452
Unit Type and Rh: 5100
Unit Type and Rh: 5100

## 2021-06-05 LAB — CBC
HCT: 25.6 % — ABNORMAL LOW (ref 39.0–52.0)
Hemoglobin: 8.7 g/dL — ABNORMAL LOW (ref 13.0–17.0)
MCH: 30 pg (ref 26.0–34.0)
MCHC: 34 g/dL (ref 30.0–36.0)
MCV: 88.3 fL (ref 80.0–100.0)
Platelets: 290 10*3/uL (ref 150–400)
RBC: 2.9 MIL/uL — ABNORMAL LOW (ref 4.22–5.81)
RDW: 15.9 % — ABNORMAL HIGH (ref 11.5–15.5)
WBC: 9.7 10*3/uL (ref 4.0–10.5)
nRBC: 0 % (ref 0.0–0.2)

## 2021-06-05 LAB — RENAL FUNCTION PANEL
Albumin: 2.6 g/dL — ABNORMAL LOW (ref 3.5–5.0)
Anion gap: 5 (ref 5–15)
BUN: 15 mg/dL (ref 6–20)
CO2: 22 mmol/L (ref 22–32)
Calcium: 7.9 mg/dL — ABNORMAL LOW (ref 8.9–10.3)
Chloride: 112 mmol/L — ABNORMAL HIGH (ref 98–111)
Creatinine, Ser: 0.44 mg/dL — ABNORMAL LOW (ref 0.61–1.24)
GFR, Estimated: >60 mL/min (ref >60)
Glucose, Bld: 101 mg/dL — ABNORMAL HIGH (ref 70–99)
Phosphorus: 2.2 mg/dL — ABNORMAL LOW (ref 2.5–4.6)
Potassium: 3 mmol/L — ABNORMAL LOW (ref 3.5–5.1)
Sodium: 139 mmol/L (ref 135–145)

## 2021-06-05 LAB — MAGNESIUM: Magnesium: 1.7 mg/dL (ref 1.7–2.4)

## 2021-06-05 MED ORDER — POTASSIUM PHOSPHATES 15 MMOLE/5ML IV SOLN
30.0000 mmol | Freq: Once | INTRAVENOUS | Status: AC
  Administered 2021-06-05: 30 mmol via INTRAVENOUS
  Filled 2021-06-05: qty 10

## 2021-06-05 MED ORDER — MAGNESIUM SULFATE 4 GM/100ML IV SOLN
4.0000 g | Freq: Once | INTRAVENOUS | Status: AC
  Administered 2021-06-05: 4 g via INTRAVENOUS
  Filled 2021-06-05: qty 100

## 2021-06-05 MED ORDER — POTASSIUM CHLORIDE CRYS ER 20 MEQ PO TBCR
40.0000 meq | EXTENDED_RELEASE_TABLET | ORAL | Status: AC
  Administered 2021-06-05 (×2): 40 meq via ORAL
  Filled 2021-06-05 (×2): qty 2

## 2021-06-05 MED ORDER — KETOROLAC TROMETHAMINE 30 MG/ML IJ SOLN
30.0000 mg | Freq: Four times a day (QID) | INTRAMUSCULAR | Status: DC | PRN
  Administered 2021-06-05 – 2021-06-09 (×10): 30 mg via INTRAVENOUS
  Filled 2021-06-05 (×10): qty 1

## 2021-06-05 NOTE — Plan of Care (Signed)

## 2021-06-05 NOTE — Progress Notes (Signed)
?PROGRESS NOTE ? ? ? ?Logan Nelson  [id number] DOB: Apr 15, 1965 DOA: 05/10/2021 ?PCP: [name], MD  ? ? ?Chief Complaint  ?Patient presents with  ? Leg Swelling  ? ? ?Brief Narrative:  ?Logan Nelson is a 56 y.o. male with past medical history of severe protein calorie malnutrition, hypertension, anasarca, abdominal wall hernia presented to the hospital with bilateral lower extremity weakness.  Patient was recently hospitalized for abdominal hernia with incarceration and had a open primary repair but then had left AMA before he was placed in rehab.  Patient was admitted this time with ambulatory dysfunction and bilateral lower extremity weakness.  He is improving slowly but is unsafe discharge disposition as he is homeless and PT OT recommending SNF but currently no bed offers so cannot safely discharge him. ?  ?Given his lower extremity swelling and anasarca we will start him on albumin 12.5 g every 8 for 3 days and also give him a dose of IV Lasix and recommend elevation of his extremities and TED hose. Will repeat a Dose of Lasix today given that he continues to have significant LE Edema.  ?  ?Blood Count dropped some so will get FOBT. Patient very upset that staff didn't have the time to take him outside yesterday and threatening to leave AMA again if he is not taken outside.  ?  ?05/31/2021 Patient's Blood Count dropped further so he was typed and screened and will transfuse 1 unit of pRBCs. Will obtain CT Scan of the Abd/Pelvis and may need to consult General Surgery to evaluate Coccyx Ulcer.  ?  ? ? ?Assessment & Plan: ?  ?Principal Problem: ?  Lower extremity weakness ?Active Problems: ?  Hypertension ?  Protein-calorie malnutrition, severe ?  Hypokalemia ?  Anemia of chronic disease ?  GERD (gastroesophageal reflux disease) ?  Generalized weakness ?  Hypophosphatemia ?  Hypomagnesemia ?  Vitamin D deficiency ?  Pressure injury of buttock, stage 2 ([place]) ?  Pressure injury of sacral region,  unstageable ([place]) ?  Anasarca ? ?#1 bilateral lower extremity weakness/right upper extremity weakness/ambulatory dysfunction/deconditioning ?-Felt likely secondary to deconditioning and severe malnutrition. ?-Patient seen by neurology who feel patient's symptoms secondary to deconditioning and severe malnutrition and recommending medical management. ?-MRI scan done of the spine was concerning for some enhancement, neurosurgery consulted and felt nonspecific finding. ?-Patient with no signs of infection. ?-Urinalysis unremarkable. ?-Vitamin B1 level of 127.7, vitamin D level of 19.9 (low), vitamin a level of 10.8(low), vitamin B12 levels of 452, vitamin D level of 6.4 (low). ?-Continue vitamin D supplementation 50,000 units every 7 days. ?-Patient seen by PT OT recommending SNF. ?-TOC consulted for SNF placement for safe disposition, difficult placement as patient with no insurance and homeless. ? ?2.  Severe protein calorie malnutrition/hypoalbuminemia, POA ?-Patient on admission presented with bilateral lower extremity weakness, noted to have severe protein calorie malnutrition. ?-Patient with severe fat depletion, severe muscle depletion, percent weight loss of 17.9% and less than 7 months. ?-Patient presented with weakness, anemia, iron level of 6, anasarca, severe protein calorie malnutrition, concern for colonic neoplasm. ?-CT abdomen and pelvis with no acute abnormalities. ?-Consulted with GI for further evaluation and management,??  Colonoscopy. ?-Patient status post IV albumin and Lasix.   ?-Continue Ensure supplementation.   ?-Patient with good oral intake.   ?-Follow. ? ?3.  Anasarca ?-Felt likely secondary to severe malnutrition, low albumin level. ?-Patient still with anasarca on examination. ?-Oral intake encouraged. ?-Patient received IV albumin and Lasix. ?-2D  echo during last hospitalization with EF of 60 to 65%, NWMA. ?-TSH of 2.975. ?-Urinalysis negative for proteinuria. ?-CT abdomen and pelvis  with severe hepatic steatosis. ?-Continue Juven twice daily, Ensure supplementation. ?-Status post IV albumin x5 days total during this hospitalization.   ?-Patient with urine output of 1.650 L over the past 24 hours. ?-Hold off on diuretics today.   ?- Follow. ? ?4.  Status post open primary repair of chronically incarcerated ventral incisional hernia, primary right inguinal hernia repair without mesh, extensive lysis of adhesions on 04/30/2021 ?-Leukocytosis resolved. ?-Patient seen by general surgery (06/01/2021) who assessed patient and feel no signs of recurrent hernia on exam or CT images and recommended mobilization, repletion of electrolytes to keep magnesium > 2, potassium > 4, patient with bowel function. ?-Tolerating current diet, appreciate general surgery input and recommendations. ? ?5.  Diarrhea ?-C. difficile screen negative. ?-GI pathogen panel negative. ?-Continue loperamide as needed, cholestyramine 4 g twice daily. ?-Lomotil discontinued per GI recommendations. ? ?6.  Hypertension ?-Not on antihypertensive medications. ?-Follow. ? ?7.  Anemia of chronic disease ?-Patient noted to be anemic with hemoglobin dropping as low as 6.4 status post transfusion 1 unit packed red blood cells 05/31/2021 hemoglobin noted at 6.7 on 06/04/2021. ?-Patient denies any overt bleeding.  ?-CT abdomen and pelvis negative for retroperitoneal bleed. ?-Ferritin level elevated. ?-Patient presented with anasarca, severe protein calorie malnutrition, weight loss, iron level of 6. ?-May benefit from IV iron during this hospitalization. ?-Transfused total of 3 units packed red blood cells hemoglobin at 8.7 today.   ?-Transfusion threshold hemoglobin < 7. ?-??  Concern for colonic neoplasm and other etiology of anemia. ?-Patient seen in consultation by gastroenterology who feel colonoscopy and EGD are reasonable during this hospitalization however are recommending reevaluation for colonoscopy and EGD when patient's anasarca,  nutritional status have significantly improved and patient is ambulatory, likely to reconsult GI near the end of his hospital stay.  ?-GI signed off as of 06/03/2021.  ? ?8.  Metabolic acidosis ?-Improved. ? ?9.  Hypokalemia ?-Potassium at 3.0.   ?-K-Dur 40 mEq p.o. every 4 hours x2 doses. ? ?10.  Pseudohypocalcemia ?-Corrected calcium at 9.26. ? ?11.  Vitamin D deficiency ?-Continue vitamin D supplementation 50,000 units q. 7 days. ? ?12.  GERD ?-PPI twice daily. ? ?13.  Leukocytosis ?-Felt likely reactive. ?-No signs of infection. ?-Leukocytosis trended down. ?-Follow. ? ?14.  Hypophosphatemia/hypomagnesemia ?-Likely secondary to protein calorie malnutrition.   ?- Phosphorus level at 2.2. ?-Magnesium at 1.7. ?-Potassium at 3.0. ?-K-Dur 40 mEq p.o. every 4 hours x 2 doses. ?-K-Phos 30 mmol IV x1. ?-Magnesium sulfate 4 g IV x1. ?-Repeat labs in the morning. ? ?15.  Thrombocytosis ?-Likely reactive. ? ?16.  Coccyx wound and pressure ulcer/pressure injury of the sacral region unstageable, POA ?-Patient seen by general surgery and no surgical indication at that time. ?-Continue recommendations by WOC. ?Pressure Injury 05/04/21 Buttocks Left Stage 2 -  Partial thickness loss of dermis presenting as a shallow open injury with a red, pink wound bed without slough. small dime sized skin tear (Active)  ?05/04/21 0800  ?Location: Buttocks  ?Location Orientation: Left  ?Staging: Stage 2 -  Partial thickness loss of dermis presenting as a shallow open injury with a red, pink wound bed without slough.  ?Wound Description (Comments): small dime sized skin tear  ?Present on Admission: No  ? ?  ? ? ? ?DVT prophylaxis: SCDs ?Code Status: Full ?Family Communication: Updated patient.  No family at bedside. ?Disposition:  ? ?  Status is: Inpatient ?Remains inpatient appropriate because: unsafe disposition ?  ?Consultants:  ?Neurology: Dr.Arora 05/11/2021 ?Wound care RN Rosalio Loud 05/25/2021 ?General surgery: Dr. Ninfa [name]  06/01/2021 ?Gastroenterology: Dr. Fuller Plan 06/02/2021 ?Neurosurgery: Dr. Christella Noa 05/12/2021 ? ?Procedures:  ?CT head 05/10/2021 ?CT chest abdomen and pelvis 05/10/2021 ?CT abdomen and pelvis 06/01/2021 ?Abdominal films 05/11/2021 ?MRI C-sp

## 2021-06-06 LAB — CBC
HCT: 28.3 % — ABNORMAL LOW (ref 39.0–52.0)
Hemoglobin: 9.3 g/dL — ABNORMAL LOW (ref 13.0–17.0)
MCH: 29.7 pg (ref 26.0–34.0)
MCHC: 32.9 g/dL (ref 30.0–36.0)
MCV: 90.4 fL (ref 80.0–100.0)
Platelets: 308 10*3/uL (ref 150–400)
RBC: 3.13 MIL/uL — ABNORMAL LOW (ref 4.22–5.81)
RDW: 15.9 % — ABNORMAL HIGH (ref 11.5–15.5)
WBC: 9.1 10*3/uL (ref 4.0–10.5)
nRBC: 0 % (ref 0.0–0.2)

## 2021-06-06 LAB — RENAL FUNCTION PANEL
Albumin: 2.3 g/dL — ABNORMAL LOW (ref 3.5–5.0)
Anion gap: 5 (ref 5–15)
BUN: 14 mg/dL (ref 6–20)
CO2: 21 mmol/L — ABNORMAL LOW (ref 22–32)
Calcium: 7.8 mg/dL — ABNORMAL LOW (ref 8.9–10.3)
Chloride: 110 mmol/L (ref 98–111)
Creatinine, Ser: 0.54 mg/dL — ABNORMAL LOW (ref 0.61–1.24)
GFR, Estimated: >60 mL/min (ref >60)
Glucose, Bld: 100 mg/dL — ABNORMAL HIGH (ref 70–99)
Phosphorus: 2.6 mg/dL (ref 2.5–4.6)
Potassium: 4.3 mmol/L (ref 3.5–5.1)
Sodium: 136 mmol/L (ref 135–145)

## 2021-06-06 LAB — MAGNESIUM: Magnesium: 1.9 mg/dL (ref 1.7–2.4)

## 2021-06-06 MED ORDER — AMLODIPINE BESYLATE 2.5 MG PO TABS
2.5000 mg | ORAL_TABLET | Freq: Every day | ORAL | Status: DC
  Administered 2021-06-06 – 2021-06-16 (×11): 2.5 mg via ORAL
  Filled 2021-06-06 (×11): qty 1

## 2021-06-06 MED ORDER — MAGNESIUM OXIDE -MG SUPPLEMENT 400 (240 MG) MG PO TABS
400.0000 mg | ORAL_TABLET | Freq: Two times a day (BID) | ORAL | Status: DC
  Administered 2021-06-06 – 2021-06-09 (×8): 400 mg via ORAL
  Filled 2021-06-06 (×8): qty 1

## 2021-06-06 NOTE — Progress Notes (Signed)
?PROGRESS NOTE ? ? ? ?[name]  [id number] DOB: 1965-10-30 DOA: 05/10/2021 ?PCP: [name], MD  ? ? ?Chief Complaint  ?Patient presents with  ? Leg Swelling  ? ? ?Brief Narrative:  ?Logan Nelson is a 56 y.o. male with past medical history of severe protein calorie malnutrition, hypertension, anasarca, abdominal wall hernia presented to the hospital with bilateral lower extremity weakness.  Patient was recently hospitalized for abdominal hernia with incarceration and had a open primary repair but then had left AMA before he was placed in rehab.  Patient was admitted this time with ambulatory dysfunction and bilateral lower extremity weakness.  He is improving slowly but is unsafe discharge disposition as he is homeless and PT OT recommending SNF but currently no bed offers so cannot safely discharge him. ?  ?Given his lower extremity swelling and anasarca we will start him on albumin 12.5 g every 8 for 3 days and also give him a dose of IV Lasix and recommend elevation of his extremities and TED hose. Will repeat a Dose of Lasix today given that he continues to have significant LE Edema.  ?  ?Blood Count dropped some so will get FOBT. Patient very upset that staff didn't have the time to take him outside yesterday and threatening to leave AMA again if he is not taken outside.  ?  ?05/31/2021 Patient's Blood Count dropped further so he was typed and screened and will transfuse 1 unit of pRBCs. Will obtain CT Scan of the Abd/Pelvis and may need to consult General Surgery to evaluate Coccyx Ulcer.  ?  ? ? ?Assessment & Plan: ?  ?Principal Problem: ?  Lower extremity weakness ?Active Problems: ?  Hypertension ?  Protein-calorie malnutrition, severe ?  Hypokalemia ?  Anemia of chronic disease ?  GERD (gastroesophageal reflux disease) ?  Generalized weakness ?  Hypophosphatemia ?  Hypomagnesemia ?  Vitamin D deficiency ?  Pressure injury of buttock, stage 2 ([place]) ?  Pressure injury of sacral region,  unstageable ([place]) ?  Anasarca ? ?#1 bilateral lower extremity weakness/right upper extremity weakness/ambulatory dysfunction/deconditioning ?-Felt likely secondary to deconditioning and severe malnutrition. ?-Patient seen by neurology who feel patient's symptoms secondary to deconditioning and severe malnutrition and recommending medical management. ?-MRI scan done of the spine was concerning for some enhancement, neurosurgery consulted and felt nonspecific finding. ?-Patient with no signs of infection. ?-Urinalysis unremarkable. ?-Vitamin B1 level of 127.7, vitamin D level of 19.9 (low), vitamin a level of 10.8(low), vitamin B12 levels of 452, vitamin D level of 6.4 (low). ?-Continue vitamin D supplementation 50,000 units every 7 days. ?-Patient seen by PT OT recommending SNF. ?-TOC consulted for SNF placement for safe disposition, difficult placement as patient with no insurance and homeless. ? ?2.  Severe protein calorie malnutrition/hypoalbuminemia, POA ?-Patient on admission presented with bilateral lower extremity weakness, noted to have severe protein calorie malnutrition. ?-Patient with severe fat depletion, severe muscle depletion, percent weight loss of 17.9% and less than 7 months. ?-Patient presented with weakness, anemia, iron level of 6, anasarca, severe protein calorie malnutrition, concern for colonic neoplasm. ?-CT abdomen and pelvis with no acute abnormalities. ?-Consulted with GI for further evaluation and management,??  Colonoscopy. ?-Patient status post IV albumin and Lasix.   ?-Continue Ensure supplementation.   ?-Patient with good oral intake.   ?-Follow. ? ?3.  Anasarca ?-Felt likely secondary to severe malnutrition, low albumin level. ?-Patient still with anasarca on examination. ?-Oral intake encouraged. ?-Patient received IV albumin and Lasix. ?-2D  echo during last hospitalization with EF of 60 to 65%, NWMA. ?-TSH of 2.975. ?-Urinalysis negative for proteinuria. ?-CT abdomen and pelvis  with severe hepatic steatosis. ?-Continue Juven twice daily, Ensure supplementation. ?-Status post IV albumin x5 days total during this hospitalization.   ?-Patient with urine output of 900 cc over the past 24 hours. ?-Continue to hold off on diuretics today.   ?- Follow. ? ?4.  Status post open primary repair of chronically incarcerated ventral incisional hernia, primary right inguinal hernia repair without mesh, extensive lysis of adhesions on 04/30/2021 ?-Leukocytosis resolved. ?-Patient seen by general surgery (06/01/2021) who assessed patient and feel no signs of recurrent hernia on exam or CT images and recommended mobilization, repletion of electrolytes to keep magnesium > 2, potassium > 4, patient with bowel function. ?-Tolerating current diet, appreciate general surgery input and recommendations. ? ?5.  Diarrhea ?-C. difficile screen negative. ?-GI pathogen panel negative. ?-Continue loperamide as needed, cholestyramine 4 g twice daily. ?-Lomotil discontinued per GI recommendations. ? ?6.  Hypertension ?-Not on antihypertensive medications. ?-Will start low-dose Norvasc 2.5 mg daily. ? ? ?7.  Anemia of chronic disease ?-Patient noted to be anemic with hemoglobin dropping as low as 6.4 status post transfusion 1 unit packed red blood cells 05/31/2021 hemoglobin noted at 6.7 on 06/04/2021. ?-Patient denies any overt bleeding.  ?-CT abdomen and pelvis negative for retroperitoneal bleed. ?-Ferritin level elevated. ?-Patient presented with anasarca, severe protein calorie malnutrition, weight loss, iron level of 6. ?-May benefit from IV iron during this hospitalization. ?-Transfused total of 3 units packed red blood cells hemoglobin at 9.3 today.   ?-Transfusion threshold hemoglobin < 7. ?-??  Concern for colonic neoplasm and other etiology of anemia. ?-Patient seen in consultation by gastroenterology who feel colonoscopy and EGD are reasonable during this hospitalization however are recommending reevaluation for  colonoscopy and EGD when patient's anasarca, nutritional status have significantly improved and patient is ambulatory, likely to reconsult GI near the end of his hospital stay.  ?-GI signed off as of 06/03/2021.  ? ?8.  Metabolic acidosis ?-Improved. ? ?9.  Hypokalemia ?-Repleted.  Potassium at 4.3.   ?-Repeat labs in the AM.   ? ?10.  Pseudohypocalcemia ?-Corrected calcium at 9.26. ? ?11.  Vitamin D deficiency ?-Continue vitamin D supplementation 50,000 units q. 7 days. ? ?12.  GERD ?-Continue PPI twice daily.  ? ?13.  Leukocytosis ?-Felt likely reactive. ?-No signs of infection. ?-Leukocytosis trended down. ?-Follow. ? ?14.  Hypophosphatemia/hypomagnesemia ?-Likely secondary to protein calorie malnutrition.   ?- Phosphorus level at 2.6 ?-Magnesium at 1.9. ?-Potassium at 4.3 ?-Placed on oral magnesium oxide 400 mg twice daily. ?-Repeat labs in the AM. ? ?15.  Thrombocytosis ?-Likely reactive. ? ?16.  Coccyx wound and pressure ulcer/pressure injury of the sacral region unstageable, POA ?-Patient seen by general surgery and no surgical indication at that time. ?-Continue recommendations by WOC. ?Pressure Injury 05/04/21 Buttocks Left Stage 2 -  Partial thickness loss of dermis presenting as a shallow open injury with a red, pink wound bed without slough. small dime sized skin tear (Active)  ?05/04/21 0800  ?Location: Buttocks  ?Location Orientation: Left  ?Staging: Stage 2 -  Partial thickness loss of dermis presenting as a shallow open injury with a red, pink wound bed without slough.  ?Wound Description (Comments): small dime sized skin tear  ?Present on Admission: No  ? ?  ? ? ? ?DVT prophylaxis: SCDs ?Code Status: Full ?Family Communication: Updated patient.  No family at bedside. ?Disposition:  ? ?  Status is: Inpatient ?Remains inpatient appropriate because: unsafe disposition ?  ?Consultants:  ?Neurology: Dr.Arora 05/11/2021 ?Wound care RN Rosalio Loud 05/25/2021 ?General surgery: Dr. Ninfa [name]  06/01/2021 ?Gastroenterology: Dr. Fuller Plan 06/02/2021 ?Neurosurgery: Dr. Christella Noa 05/12/2021 ? ?Procedures:  ?CT head 05/10/2021 ?CT chest abdomen and pelvis 05/10/2021 ?CT abdomen and pelvis 06/01/2021 ?Abdominal films 05/11/2021 ?MRI C-spine

## 2021-06-07 LAB — CBC
HCT: 29.2 % — ABNORMAL LOW (ref 39.0–52.0)
Hemoglobin: 9.3 g/dL — ABNORMAL LOW (ref 13.0–17.0)
MCH: 29.8 pg (ref 26.0–34.0)
MCHC: 31.8 g/dL (ref 30.0–36.0)
MCV: 93.6 fL (ref 80.0–100.0)
Platelets: 306 10*3/uL (ref 150–400)
RBC: 3.12 MIL/uL — ABNORMAL LOW (ref 4.22–5.81)
RDW: 15.7 % — ABNORMAL HIGH (ref 11.5–15.5)
WBC: 9.7 10*3/uL (ref 4.0–10.5)
nRBC: 0 % (ref 0.0–0.2)

## 2021-06-07 LAB — RENAL FUNCTION PANEL
Albumin: 2.2 g/dL — ABNORMAL LOW (ref 3.5–5.0)
Anion gap: 7 (ref 5–15)
BUN: 14 mg/dL (ref 6–20)
CO2: 23 mmol/L (ref 22–32)
Calcium: 7.8 mg/dL — ABNORMAL LOW (ref 8.9–10.3)
Chloride: 106 mmol/L (ref 98–111)
Creatinine, Ser: 0.52 mg/dL — ABNORMAL LOW (ref 0.61–1.24)
GFR, Estimated: >60 mL/min (ref >60)
Glucose, Bld: 98 mg/dL (ref 70–99)
Phosphorus: 2.4 mg/dL — ABNORMAL LOW (ref 2.5–4.6)
Potassium: 3.7 mmol/L (ref 3.5–5.1)
Sodium: 136 mmol/L (ref 135–145)

## 2021-06-07 LAB — MAGNESIUM: Magnesium: 1.7 mg/dL (ref 1.7–2.4)

## 2021-06-07 MED ORDER — FUROSEMIDE 10 MG/ML IJ SOLN
40.0000 mg | Freq: Two times a day (BID) | INTRAMUSCULAR | Status: AC
  Administered 2021-06-07 – 2021-06-08 (×3): 40 mg via INTRAVENOUS
  Filled 2021-06-07 (×3): qty 4

## 2021-06-07 MED ORDER — DAKINS (1/4 STRENGTH) 0.125 % EX SOLN
Freq: Two times a day (BID) | CUTANEOUS | Status: AC
  Filled 2021-06-07: qty 473

## 2021-06-07 MED ORDER — K PHOS MONO-SOD PHOS DI & MONO 155-852-130 MG PO TABS
250.0000 mg | ORAL_TABLET | Freq: Two times a day (BID) | ORAL | Status: AC
Start: 2021-06-07 — End: 2021-06-09
  Administered 2021-06-07 – 2021-06-09 (×6): 250 mg via ORAL
  Filled 2021-06-07 (×6): qty 1

## 2021-06-07 MED ORDER — ALBUMIN HUMAN 25 % IV SOLN
25.0000 g | Freq: Two times a day (BID) | INTRAVENOUS | Status: AC
  Administered 2021-06-07 – 2021-06-08 (×3): 25 g via INTRAVENOUS
  Filled 2021-06-07 (×3): qty 100

## 2021-06-07 MED ORDER — SENNOSIDES-DOCUSATE SODIUM 8.6-50 MG PO TABS
1.0000 | ORAL_TABLET | Freq: Two times a day (BID) | ORAL | Status: DC
  Administered 2021-06-07 – 2021-06-09 (×2): 1 via ORAL
  Filled 2021-06-07 (×3): qty 1

## 2021-06-07 NOTE — Consult Note (Signed)
[place] Nurse wound follow up ?Patient receiving care in [facility] [id number] ?Wound type: Stage 3 on coccyx ?Measurement: 3 cm x 2 cm x 2.3 cm ?Wound bed: Remaining slough has completely come off.  Overall the wound has decreased in size but is tunneling at 12 o'clock 3.5 cm and draining purulent malodorous drainage.  ?Periwound: intact ?Dressing procedure/placement/frequency: ?Moisten Kerlix with Dakin's solution, insert into the wound gently place into the tunneled area at 12 o'clock, followed by dry gauze, cover with Sacral foam dressing. Twice daily dressing changes with Dakin's x 3 days. Once this order has expired then it will be followed by twice daily NS wet to dry dressings. ? ?Monitor the wound area(s) for worsening of condition such as: ?Signs/symptoms of infection, increase in size, development of or worsening of odor, ?development of pain, or increased pain at the affected locations.   ?Notify the medical team if any of these develop. ? ?Thank you for the consult. [place] nurse will not follow at this time.   ?Please re-consult the [facility] team if needed. ? ?Cathlean [place]se[place]. Tamala Julian, MSN, RN, CMSRN, AGCNS, WTA ?Wound Treatment Associate ?Pager [phone]   ?

## 2021-06-07 NOTE — Progress Notes (Signed)
?PROGRESS NOTE ? ? ? ?Logan Nelson  [id number] DOB: 04-24-1965 DOA: 05/10/2021 ?PCP: [name], MD  ? ? ?Chief Complaint  ?Patient presents with  ? Leg Swelling  ? ? ?Brief Narrative:  ?Logan Nelson is a 56 y.o. male with past medical history of severe protein calorie malnutrition, hypertension, anasarca, abdominal wall hernia presented to the hospital with bilateral lower extremity weakness.  Patient was recently hospitalized for abdominal hernia with incarceration and had a open primary repair but then had left AMA before he was placed in rehab.  Patient was admitted this time with ambulatory dysfunction and bilateral lower extremity weakness.  He is improving slowly but is unsafe discharge disposition as he is homeless and PT OT recommending SNF but currently no bed offers so cannot safely discharge him. ?  ?Given his lower extremity swelling and anasarca we will start him on albumin 12.5 g every 8 for 3 days and also give him a dose of IV Lasix and recommend elevation of his extremities and TED hose. Will repeat a Dose of Lasix today given that he continues to have significant LE Edema.  ?  ?Blood Count dropped some so will get FOBT. Patient very upset that staff didn't have the time to take him outside yesterday and threatening to leave AMA again if he is not taken outside.  ?  ?05/31/2021 Patient's Blood Count dropped further so he was typed and screened and will transfuse 1 unit of pRBCs. Will obtain CT Scan of the Abd/Pelvis and may need to consult General Surgery to evaluate Coccyx Ulcer.  ?  ? ? ?Assessment & Plan: ?  ?Principal Problem: ?  Lower extremity weakness ?Active Problems: ?  Hypertension ?  Protein-calorie malnutrition, severe ?  Hypokalemia ?  Anemia of chronic disease ?  GERD (gastroesophageal reflux disease) ?  Generalized weakness ?  Hypophosphatemia ?  Hypomagnesemia ?  Vitamin D deficiency ?  Pressure injury of buttock, stage 2 ([place]) ?  Pressure injury of sacral region,  unstageable ([place]) ?  Anasarca ? ?#1 bilateral lower extremity weakness/right upper extremity weakness/ambulatory dysfunction/deconditioning ?-Felt likely secondary to deconditioning and severe malnutrition. ?-Patient seen by neurology who feel patient's symptoms secondary to deconditioning and severe malnutrition and recommending medical management. ?-MRI scan done of the spine was concerning for some enhancement, neurosurgery consulted and felt nonspecific finding. ?-Patient with no signs of infection. ?-Urinalysis unremarkable. ?-Vitamin B1 level of 127.7, vitamin D level of 19.9 (low), vitamin a level of 10.8(low), vitamin B12 levels of 452, vitamin D level of 6.4 (low). ?-Continue vitamin D supplementation 50,000 units every 7 days. ?-Patient seen by PT OT recommending SNF. ?-TOC consulted for SNF placement for safe disposition, difficult placement as patient with no insurance and homeless. ? ?2.  Severe protein calorie malnutrition/hypoalbuminemia, POA ?-Patient on admission presented with bilateral lower extremity weakness, noted to have severe protein calorie malnutrition. ?-Patient with severe fat depletion, severe muscle depletion, percent weight loss of 17.9% and less than 7 months. ?-Patient presented with weakness, anemia, iron level of 6, anasarca, severe protein calorie malnutrition, concern for colonic neoplasm. ?-CT abdomen and pelvis with no acute abnormalities. ?-Consulted with GI for further evaluation and management,??  Colonoscopy. ?-Patient status post IV albumin and Lasix.   ?-Continue Ensure supplementation.   ?-Patient with good oral intake.   ?-Follow. ? ?3.  Anasarca ?-Felt likely secondary to severe malnutrition, low albumin level. ?-Patient still with anasarca on examination. ?-Oral intake encouraged. ?-Patient received IV albumin and Lasix. ?-2D  echo during last hospitalization with EF of 60 to 65%, NWMA. ?-TSH of 2.975. ?-Urinalysis negative for proteinuria. ?-CT abdomen and pelvis  with severe hepatic steatosis. ?-Continue Juven twice daily, Ensure supplementation. ?-Status post IV albumin x5 days total during this hospitalization.   ?-Patient with urine output of 1.7 L over the past 24 hours ?-Diuretics held over the past 24 to 48 hours ?-IV albumin twice daily, Lasix 40 mg IV every 12 hours x4 doses.  ?- Follow. ? ?4.  Status post open primary repair of chronically incarcerated ventral incisional hernia, primary right inguinal hernia repair without mesh, extensive lysis of adhesions on 04/30/2021 ?-Leukocytosis resolved. ?-Patient seen by general surgery (06/01/2021) who assessed patient and feel no signs of recurrent hernia on exam or CT images and recommended mobilization, repletion of electrolytes to keep magnesium > 2, potassium > 4, patient with bowel function. ?-Tolerating current diet, appreciate general surgery input and recommendations. ? ?5.  Diarrhea ?-C. difficile screen negative. ?-GI pathogen panel negative. ?-Continue loperamide as needed, cholestyramine 4 g twice daily. ?-Lomotil discontinued per GI recommendations. ? ?6.  Hypertension ?-Not on antihypertensive medications prior to admission ?-Continue Norvasc 2.5 mg daily. ?-IV Lasix ordered for anasarca.. ? ? ?7.  Anemia of chronic disease ?-Patient noted to be anemic with hemoglobin dropping as low as 6.4 status post transfusion 1 unit packed red blood cells 05/31/2021 hemoglobin noted at 6.7 on 06/04/2021. ?-Patient denies any overt bleeding.  ?-CT abdomen and pelvis negative for retroperitoneal bleed. ?-Ferritin level elevated. ?-Patient presented with anasarca, severe protein calorie malnutrition, weight loss, iron level of 6. ?-May benefit from IV iron during this hospitalization. ?-Transfused total of 3 units packed red blood cells hemoglobin at 9.3 today.   ?-Transfusion threshold hemoglobin < 7. ?-??  Concern for colonic neoplasm and other etiology of anemia. ?-Patient seen in consultation by gastroenterology who feel  colonoscopy and EGD are reasonable during this hospitalization however are recommending reevaluation for colonoscopy and EGD when patient's anasarca, nutritional status have significantly improved and patient is ambulatory, likely to reconsult GI near the end of his hospital stay.  ?-GI signed off as of 06/03/2021.  ? ?8.  Metabolic acidosis ?-Improved. ? ?9.  Hypokalemia ?-Repleted.  Potassium at 3.7 ?-Repeat labs in the AM.   ? ?10.  Pseudohypocalcemia ?-Corrected calcium at 9.26. ? ?11.  Vitamin D deficiency ?-Continue vitamin D supplementation 50,000 units q. 7 days. ? ?12.  GERD ?-PPI twice daily.   ? ?13.  Leukocytosis ?-Felt likely reactive. ?-No signs of infection. ?-Leukocytosis trended down. ?-Follow. ? ?14.  Hypophosphatemia/hypomagnesemia ?-Likely secondary to protein calorie malnutrition.   ?- Phosphorus level at 2.4 ?-Magnesium at 1.7. ?-Potassium at 3.7 ?-Continue magnesium oxide 400 mg twice daily. ?-K-Phos 250 mg p.o. twice daily x3 days. ?-Placed on oral magnesium oxide 400 mg twice daily. ?-Repeat labs in the AM. ? ?15.  Thrombocytosis ?-Likely reactive. ? ?16.  Coccyx wound and pressure ulcer/pressure injury of the sacral region unstageable, POA ?-Patient seen by general surgery and no surgical indication at that time. ?-Patient with some purulent malodorous drainage noted per RN today. ?-Patient reassessed by [facility] and recommendations made. ?-If worsening purulent drainage may need to have patient reassessed by general surgery. ?-Currently afebrile, normal white count. ?Pressure Injury 05/04/21 Buttocks Left Stage 2 -  Partial thickness loss of dermis presenting as a shallow open injury with a red, pink wound bed without slough. small dime sized skin tear (Active)  ?05/04/21 0800  ?Location: Buttocks  ?Location Orientation:  Left  ?Staging: Stage 2 -  Partial thickness loss of dermis presenting as a shallow open injury with a red, pink wound bed without slough.  ?Wound Description (Comments): small  dime sized skin tear  ?Present on Admission: No  ? ?  ? ? ? ?DVT prophylaxis: SCDs ?Code Status: Full ?Family Communication: Updated patient.  No family at bedside. ?Disposition:  ? ?Status is: Inpatient

## 2021-06-08 LAB — CBC WITH DIFFERENTIAL/PLATELET
Abs Immature Granulocytes: 0.05 10*3/uL (ref 0.00–0.07)
Basophils Absolute: 0.1 10*3/uL (ref 0.0–0.1)
Basophils Relative: 1 %
Eosinophils Absolute: 0.2 10*3/uL (ref 0.0–0.5)
Eosinophils Relative: 2 %
HCT: 27.8 % — ABNORMAL LOW (ref 39.0–52.0)
Hemoglobin: 8.9 g/dL — ABNORMAL LOW (ref 13.0–17.0)
Immature Granulocytes: 1 %
Lymphocytes Relative: 16 %
Lymphs Abs: 1.4 10*3/uL (ref 0.7–4.0)
MCH: 29.3 pg (ref 26.0–34.0)
MCHC: 32 g/dL (ref 30.0–36.0)
MCV: 91.4 fL (ref 80.0–100.0)
Monocytes Absolute: 1.2 10*3/uL — ABNORMAL HIGH (ref 0.1–1.0)
Monocytes Relative: 14 %
Neutro Abs: 5.9 10*3/uL (ref 1.7–7.7)
Neutrophils Relative %: 66 %
Platelets: 324 10*3/uL (ref 150–400)
RBC: 3.04 MIL/uL — ABNORMAL LOW (ref 4.22–5.81)
RDW: 15.5 % (ref 11.5–15.5)
WBC: 8.7 10*3/uL (ref 4.0–10.5)
nRBC: 0 % (ref 0.0–0.2)

## 2021-06-08 LAB — RENAL FUNCTION PANEL
Albumin: 2.4 g/dL — ABNORMAL LOW (ref 3.5–5.0)
Anion gap: 6 (ref 5–15)
BUN: 14 mg/dL (ref 6–20)
CO2: 24 mmol/L (ref 22–32)
Calcium: 7.9 mg/dL — ABNORMAL LOW (ref 8.9–10.3)
Chloride: 107 mmol/L (ref 98–111)
Creatinine, Ser: 0.43 mg/dL — ABNORMAL LOW (ref 0.61–1.24)
GFR, Estimated: >60 mL/min (ref >60)
Glucose, Bld: 91 mg/dL (ref 70–99)
Phosphorus: 2.8 mg/dL (ref 2.5–4.6)
Potassium: 3.4 mmol/L — ABNORMAL LOW (ref 3.5–5.1)
Sodium: 137 mmol/L (ref 135–145)

## 2021-06-08 LAB — MAGNESIUM: Magnesium: 1.6 mg/dL — ABNORMAL LOW (ref 1.7–2.4)

## 2021-06-08 MED ORDER — POTASSIUM CHLORIDE CRYS ER 10 MEQ PO TBCR
40.0000 meq | EXTENDED_RELEASE_TABLET | Freq: Once | ORAL | Status: AC
  Administered 2021-06-08: 40 meq via ORAL
  Filled 2021-06-08: qty 4

## 2021-06-08 MED ORDER — MAGNESIUM SULFATE 4 GM/100ML IV SOLN
4.0000 g | Freq: Once | INTRAVENOUS | Status: AC
  Administered 2021-06-08: 4 g via INTRAVENOUS
  Filled 2021-06-08 (×2): qty 100

## 2021-06-08 NOTE — Progress Notes (Addendum)
Patient added to DTP list for discharge planning. ? ?Per Dr. Laural Benes, patient is not medically stable but will be ready for discharge on Monday 5/8. ? ?Patient's expected discharge plan will be to the [facility] on Monday after he is medically stable. CSW made therapy team aware of barriers to placement. ? ?Edwin Dada, MSW, LCSW ?Transitions of Care  Clinical Social Worker II ?[phone] ? ?

## 2021-06-08 NOTE — Plan of Care (Signed)
  Problem: Clinical Measurements: Goal: Will remain free from infection Outcome: Progressing Goal: Diagnostic test results will improve Outcome: Progressing Goal: Respiratory complications will improve Outcome: Progressing Goal: Cardiovascular complication will be avoided Outcome: Progressing   

## 2021-06-08 NOTE — Progress Notes (Signed)
Nutrition Follow-up ? ?DOCUMENTATION CODES:  ?Severe malnutrition in context of chronic illness ? ?INTERVENTION:  ?Continue current diet as ordered, encourage PO intake ?Discontinue Juven, pt is not consuing ?Discontinue magic cup, pt does not like them ?continue double protein portions TID w/ meals ?Continue Boost Breeze po TID, each supplement provides 250 kcal and 9 grams of protein ?continue MVI with minerals daily ?Continue vitamin D 50,000 units weekly (added end date of 5/10) ?continue vitamin A 10,000 units daily x 1 month given previously identified vitamin A deficiency (end date 5/05) ?Re-request new measured weight ? ?NUTRITION DIAGNOSIS:  ?Severe Malnutrition related to chronic illness as evidenced by severe fat depletion, severe muscle depletion, percent weight loss (17.9% weight loss in less than 7 months). ?- remains applicable ? ?GOAL:  ?Patient will meet greater than or equal to 90% of their needs ?- progressing, diet in place with good PO intake ? ?MONITOR:  ?PO intake, Supplement acceptance, Labs, Weight trends, Skin, I & O's ? ?REASON FOR ASSESSMENT:  ?Malnutrition Screening Tool, Consult ?Assessment of nutrition requirement/status ? ?ASSESSMENT:  ?56 year old male who presented to the ED on 4/03 with fatigue, weakness, LE edema. PMH of severe malnutrition, HTN, anasarca, GERD, abdominal wall hernia. Pt with recent admission for abdominal hernia with incarceration s/p repair requiring TPN during admission. Pt left AMA from this admission. ? ?Pt resting in bedside chair preparing to eat lunch at the time of assessment. Enthusiatic about his meal. Helped pt open containers and with his condiments as he reports he is unable to use his right arm.  ? ?Discussed nutrition supplements he was receiving. Pt did not have a magic cup on his tray, states he did not like them and wasn't eating them. Also has been refusing juven for several days. Will Remove both supplements. Pt reports that the boost breeze  are "alright" will continue them as they were previously added by another provider. Pt hopeful to get stronger so he can be discharged soon. Encouraged adequate intake of meals. ? ?Still has not been weighed since admission, will request new weight again. ? ?Average Meal Intake: ?4/19-4/25: 100% intake x 8 recorded meals ?4/26-5/2: 100% intake x 5 recorded meals ? ?Nutritionally Relevant Medications: ?Scheduled Meds: ? cholestyramine  4 g Oral BID  ? Boost Breeze  1 Container Oral TID BM  ? furosemide  40 mg Intravenous Q12H  ? magnesium oxide  400 mg Oral BID  ? multivitamin with minerals  1 tablet Oral Daily  ? JUVEN  1 packet Oral BID BM  ? pantoprazole  40 mg Oral BID AC  ? phosphorus  250 mg Oral BID  ? potassium chloride  40 mEq Oral Once  ? senna-docusate  1 tablet Oral BID  ? vitamin A  10,000 Units Oral Daily  ? Vitamin D (Ergocalciferol)  50,000 Units Oral Q7 days  ? ?Continuous Infusions: ? albumin human 25 g (06/08/21 0828)  ? magnesium sulfate bolus IVPB    ? ?PRN Meds: ondansetron, simethicone ? ?Labs Reviewed: ?K 3.4 ?Creatinine .43 ?Mg 1.6 ? ?Vitamin/Mineral Profile: ?Thiamine B1: 127.7 (WNL) on 4/03 ?Vitamin B12: 929 (H) on 3/06 ?Folate B9: 33.3 (WNL) on 3/17 ?Vitamin A: 10.8 (L) on 3/17 ?Vitamin D: 8.19 (L) on 3/17 ?Vitamin E (alpha tocopherol): 6.4 (L) on 3/17 ?Vitamin E (gamma tocopherol): 2.1 (WNL) on 3/17 ?Copper: 74 (WNL) on 3/17 ?Zinc: 43 (L) on 3/17 ? ?NUTRITION - FOCUSED PHYSICAL EXAM: ?Flowsheet Row Most Recent Value  ?Orbital Region Severe depletion  ?Upper Arm  Region Severe depletion  ?Thoracic and Lumbar Region Severe depletion  ?Buccal Region Severe depletion  ?Temple Region Severe depletion  ?Clavicle Bone Region Severe depletion  ?Clavicle and Acromion Bone Region Severe depletion  ?Scapular Bone Region Severe depletion  ?Dorsal Hand Severe depletion  ?Patellar Region Unable to assess  [severe edema to BLE]  ?Anterior Thigh Region Unable to assess  ?Posterior Calf Region Unable to  assess  ?Edema (RD Assessment) Severe  [BLE]  ?Hair Reviewed  ?Eyes Reviewed  ?Mouth Reviewed  ?Skin Reviewed  ?Nails Reviewed  ? ?Diet Order:   ?Diet Order   ? ?       ?  Diet regular Room service appropriate? Yes; Fluid consistency: Thin  Diet effective now       ?  ? ?  ?  ? ?  ? ? ?EDUCATION NEEDS:  ?Education needs have been addressed ? ?Skin:  Skin Assessment: Reviewed RN Assessment (per WOC: Stage 3 on coccyx, 3 cm x 2 cm x 2.3 cm. Overall the wound has decreased in size but is tunneling at 12 o'clock 3.5 cm and draining purulent malodorous drainage.) ? ?Last BM:  4/30 - type 6 ? ?Height:  ?Ht Readings from Last 1 Encounters:  ?05/13/21 5\' 6"  (1.676 m)  ? ? ?Weight:  ?Wt Readings from Last 1 Encounters:  ?05/13/21 65 kg  ? ? ?Ideal Body Weight:  64.5 kg ? ?BMI:  Body mass index is 23.13 kg/m?. ? ?Estimated Nutritional Needs:  ?Kcal:  2100-2300 ?Protein:  100-115 grams ?Fluid:  >2.0 L ? ? ?Ranell Patrick, RD, LDN ?Clinical Dietitian ?RD pager # available in [place]  ?After hours/weekend pager # available in [place] ?

## 2021-06-08 NOTE — Progress Notes (Signed)
?PROGRESS NOTE ? ? ? ?Cyan Drakos  [id number] DOB: 05-Oct-1965 DOA: 05/10/2021 ?PCP: [name], MD  ? ? ?Chief Complaint  ?Patient presents with  ? Leg Swelling  ? ? ?Brief Narrative:  ?Logan Nelson is a 56 y.o. male with past medical history of severe protein calorie malnutrition, hypertension, anasarca, abdominal wall hernia presented to the hospital with bilateral lower extremity weakness.  Patient was recently hospitalized for abdominal hernia with incarceration and had a open primary repair but then had left AMA before he was placed in rehab.  Patient was admitted this time with ambulatory dysfunction and bilateral lower extremity weakness.  He is improving slowly but is unsafe discharge disposition as he is homeless and PT OT recommending SNF but currently no bed offers so cannot safely discharge him. ?  ?Given his lower extremity swelling and anasarca we will start him on albumin 12.5 g every 8 for 3 days and also give him a dose of IV Lasix and recommend elevation of his extremities and TED hose. Will repeat a Dose of Lasix today given that he continues to have significant LE Edema.  ?  ?Blood Count dropped some so will get FOBT. Patient very upset that staff didn't have the time to take him outside yesterday and threatening to leave AMA again if he is not taken outside.  ?  ?05/31/2021 Patient's Blood Count dropped further so he was typed and screened and will transfuse 1 unit of pRBCs. Will obtain CT Scan of the Abd/Pelvis and may need to consult General Surgery to evaluate Coccyx Ulcer.  ?  ? ? ?Assessment & Plan: ?  ?Principal Problem: ?  Lower extremity weakness ?Active Problems: ?  Hypertension ?  Protein-calorie malnutrition, severe ?  Hypokalemia ?  Anemia of chronic disease ?  GERD (gastroesophageal reflux disease) ?  Generalized weakness ?  Hypophosphatemia ?  Hypomagnesemia ?  Vitamin D deficiency ?  Pressure injury of buttock, stage 2 ([place]) ?  Pressure injury of sacral region,  unstageable ([place]) ?  Anasarca ? ?#1 bilateral lower extremity weakness/right upper extremity weakness/ambulatory dysfunction/deconditioning ?-Felt likely secondary to deconditioning and severe malnutrition. ?-Patient seen by neurology who feel patient's symptoms secondary to deconditioning and severe malnutrition and recommending medical management. ?-MRI scan done of the spine was concerning for some enhancement, neurosurgery consulted and felt nonspecific finding. ?-Patient with no signs of infection. ?-Urinalysis unremarkable. ?-Vitamin B1 level of 127.7, vitamin D level of 19.9 (low), vitamin a level of 10.8(low), vitamin B12 levels of 452, vitamin D level of 6.4 (low). ?-Continue vitamin D supplementation 50,000 units every 7 days. ?-Patient seen by PT/ OT recommending SNF. ?-TOC consulted for SNF placement for safe disposition, difficult placement as patient with no insurance and homeless. ? ?2.  Severe protein calorie malnutrition/hypoalbuminemia, POA ?-Patient on admission presented with bilateral lower extremity weakness, noted to have severe protein calorie malnutrition. ?-Patient with severe fat depletion, severe muscle depletion, percent weight loss of 17.9% and less than 7 months. ?-Patient presented with weakness, anemia, iron level of 6, anasarca, severe protein calorie malnutrition, concern for colonic neoplasm. ?-CT abdomen and pelvis with no acute abnormalities. ?-Consulted with GI for further evaluation and management,??  Colonoscopy. ?-Patient status post IV albumin and Lasix.   ?-Continue Ensure supplementation.   ?-Patient with good oral intake.   ?-Follow. ? ?3.  Anasarca ?-Felt likely secondary to severe malnutrition, low albumin level. ?-Patient still with anasarca on examination. ?-Oral intake encouraged. ?-Patient received IV albumin and Lasix. ?-2D  echo during last hospitalization with EF of 60 to 65%, NWMA. ?-TSH of 2.975. ?-Urinalysis negative for proteinuria. ?-CT abdomen and pelvis  with severe hepatic steatosis. ?-Continue Juven twice daily, Ensure supplementation. ?-Status post IV albumin x6 days total during this hospitalization.   ?-Patient with urine output of 2.850 L over the past 24 hours ?-Patient placed back on IV Lasix which we will discontinue after today's doses. ?-Follow. ? ?4.  Status post open primary repair of chronically incarcerated ventral incisional hernia, primary right inguinal hernia repair without mesh, extensive lysis of adhesions on 04/30/2021 ?-Leukocytosis resolved. ?-Patient seen by general surgery (06/01/2021) who assessed patient and feel no signs of recurrent hernia on exam or CT images and recommended mobilization, repletion of electrolytes to keep magnesium > 2, potassium > 4, patient with bowel function. ?-Tolerating current diet, appreciate general surgery input and recommendations. ? ?5.  Diarrhea ?-C. difficile screen negative. ?-GI pathogen panel negative. ?-Continue loperamide as needed, cholestyramine 4 g twice daily. ?-Lomotil discontinued per GI recommendations. ? ?6.  Hypertension ?-Not on antihypertensive medications prior to admission ?-Continue Norvasc 2.5 mg daily. ?-Was receiving IV Lasix for anasarca which we will discontinue. ?-Follow and uptitrate Norvasc as needed for better blood pressure control. ? ? ?7.  Anemia of chronic disease ?-Patient noted to be anemic with hemoglobin dropping as low as 6.4 status post transfusion 1 unit packed red blood cells 05/31/2021 hemoglobin noted at 6.7 on 06/04/2021. ?-Patient denies any overt bleeding.  ?-CT abdomen and pelvis negative for retroperitoneal bleed. ?-Ferritin level elevated. ?-Patient presented with anasarca, severe protein calorie malnutrition, weight loss, iron level of 6. ?-May benefit from IV iron during this hospitalization. ?-Transfused total of 3 units packed red blood cells hemoglobin at 8.9.  ?-Transfusion threshold hemoglobin < 7. ?-??  Concern for colonic neoplasm and other etiology of  anemia. ?-Patient seen in consultation by gastroenterology who feel colonoscopy and EGD are reasonable during this hospitalization however are recommending reevaluation for colonoscopy and EGD when patient's anasarca, nutritional status have significantly improved and patient is ambulatory, likely to reconsult GI near the end of his hospital stay.  ?-GI signed off as of 06/03/2021.  ? ?8.  Metabolic acidosis ?-Resolved. ? ?9.  Hypokalemia ?-Potassium at 3.4. ?-Secondary to diuretics. ?-K-Dur 40 mEq p.o. x1. ?-Repeat labs in the AM.   ? ?10.  Pseudohypocalcemia ?-Corrected calcium at 9.26. ? ?11.  Vitamin D deficiency ?-Continue vitamin D supplementation 50,000 units q. 7 days. ? ?12.  GERD ?-Continue PPI twice daily.   ? ?13.  Leukocytosis ?-Felt likely reactive. ?-No signs of infection. ?-Leukocytosis trended down. ?-Follow. ? ?14.  Hypophosphatemia/hypomagnesemia ?-Likely secondary to protein calorie malnutrition.   ?- Phosphorus level at 2.8 ?-Magnesium at 1.6. ?-Potassium at 3.4 ?-Continue magnesium oxide 400 mg twice daily. ?-Continue K-Phos 250 mg p.o. twice daily x3 days. ?-Magnesium sulfate 4 g IV x1.   ?-K-Dur 40 mEq p.o. x1.   ?-Repeat labs in the morning.   ? ?15.  Thrombocytosis ?-Likely reactive. ?-Resolved. ? ?16.  Coccyx wound and pressure ulcer/pressure injury of the sacral region unstageable, POA ?-Patient seen by general surgery and no surgical indication at that time. ?-Patient with some purulent malodorous drainage noted per RN on 06/07/2021.   ?-Patient reassessed by [facility] and recommendations made. ?-If worsening purulent drainage may need to have patient reassessed by general surgery. ?-Currently afebrile, normal white count. ?Pressure Injury 05/04/21 Buttocks Left Stage 2 -  Partial thickness loss of dermis presenting as a shallow open injury  with a red, pink wound bed without slough. small dime sized skin tear (Active)  ?05/04/21 0800  ?Location: Buttocks  ?Location Orientation: Left  ?Staging:  Stage 2 -  Partial thickness loss of dermis presenting as a shallow open injury with a red, pink wound bed without slough.  ?Wound Description (Comments): small dime sized skin tear  ?Present on Admissio

## 2021-06-08 NOTE — Progress Notes (Signed)
Occupational Therapy Treatment ?Patient Details ?Name: Logan Nelson ?MRN: [id number] ?DOB: 11/13/1965 ?Today's Date: 06/08/2021 ? ? ?History of present illness Pt is a 89 admitted 4/3 with quickly progressing B LE weakness.  Recently hospitalize with incarcerated abdominal hernia, was repaired, but pt left AMA before he was safe to mobilize,  MRI of brain, cervical/thoracic spine inconclusive.  Work up continues.  PMHx: anemia, arthritis, dyspnea, GERD, HTN, LE edema ?  ?OT comments ? Patient received in bed and agreeable to OT session and chair transfer. Patient able to get to EOB and transfer to recliner with min guard assist for safety, patient declined using RW for transfer. Patient performed light grooming and gown change in recliner. Patient demonstrated increased edema in RUE hand and addressed with retrograde message and dexterity exercises. Acute OT to continue to follow.   ? ?Recommendations for follow up therapy are one component of a multi-disciplinary discharge planning process, led by the attending physician.  Recommendations may be updated based on patient status, additional functional criteria and insurance authorization. ?   ?Follow Up Recommendations ? Skilled nursing-short term rehab (<3 hours/day)  ?  ?Assistance Recommended at Discharge Frequent or constant Supervision/Assistance  ?Patient can return home with the following ? A lot of help with walking and/or transfers;A lot of help with bathing/dressing/bathroom;Assist for transportation;Assistance with cooking/housework ?  ?Equipment Recommendations ? BSC/3in1;Tub/shower bench  ?  ?Recommendations for Other Services   ? ?  ?Precautions / Restrictions Precautions ?Precautions: Fall ?Precaution Comments: bilat LE edema, R UE edema, distending abdomen ?Restrictions ?Weight Bearing Restrictions: No  ? ? ?  ? ?Mobility Bed Mobility ?Overal bed mobility: Needs Assistance ?Bed Mobility: Supine to Sit ?  ?  ?Supine to sit: Min guard ?  ?  ?General  bed mobility comments: able to get to EOB with min guard ?  ? ?Transfers ?Overall transfer level: Needs assistance ?Equipment used: None ?Transfers: Sit to/from Stand, Bed to chair/wheelchair/BSC ?Sit to Stand: Min guard ?  ?  ?Step pivot transfers: Min guard ?  ?  ?General transfer comment: patient was min guard to stand from raised bed and to pivot to recliner ?  ?  ?Balance Overall balance assessment: Needs assistance ?Sitting-balance support: Feet supported, Bilateral upper extremity supported ?Sitting balance-Leahy Scale: Fair ?  ?  ?Standing balance support: Single extremity supported ?Standing balance-Leahy Scale: Poor ?Standing balance comment: able to stand for transfer ?  ?  ?  ?  ?  ?  ?  ?  ?  ?  ?  ?   ? ?ADL either performed or assessed with clinical judgement  ? ?ADL Overall ADL's : Needs assistance/impaired ?  ?  ?Grooming: Set up;Sitting ?Grooming Details (indicate cue type and reason): in recliner ?  ?  ?  ?  ?Upper Body Dressing : Supervision/safety;Sitting ?Upper Body Dressing Details (indicate cue type and reason): changed gown in recliner ?  ?  ?  ?  ?  ?  ?  ?  ?  ?General ADL Comments: performed grooming and gown changed, focused on RUE edema ?  ? ?Extremity/Trunk Assessment Upper Extremity Assessment ?RUE Deficits / Details: uncoordinated but able to use for functional tasks ?RUE Sensation: decreased light touch;decreased proprioception ?RUE Coordination: decreased fine motor;decreased gross motor ?  ?  ?  ?  ?  ? ?Vision   ?  ?  ?Perception   ?  ?Praxis   ?  ? ?Cognition Arousal/Alertness: Awake/alert ?Behavior During Therapy: [facility] for tasks assessed/performed ?Overall Cognitive  Status: No family/caregiver present to determine baseline cognitive functioning ?Area of Impairment: Problem solving, Safety/judgement, Awareness, Following commands ?  ?  ?  ?  ?  ?  ?  ?  ?  ?  ?  ?  ?  ?  ?  ?  ?  ?  ?   ?Exercises General Exercises - Upper Extremity ?Digit Composite Flexion: Squeeze ball, 10  reps ? ?  ?Shoulder Instructions   ? ? ?  ?General Comments increased edema on this date in RUE, addressed with retrograde message, dexterity exercises, and positioning  ? ? ?Pertinent Vitals/ Pain       Pain Assessment ?Pain Assessment: Faces ?Pain Location: generalized, R hand ?Pain Descriptors / Indicators: Grimacing ?Pain Intervention(s): Monitored during session, Repositioned ? ?Home Living   ?  ?  ?  ?  ?  ?  ?  ?  ?  ?  ?  ?  ?  ?  ?  ?  ?  ?  ? ?  ?Prior Functioning/Environment    ?  ?  ?  ?   ? ?Frequency ? Min 2X/week  ? ? ? ? ?  ?Progress Toward Goals ? ?OT Goals(current goals can now be found in the care plan section) ? Progress towards OT goals: Progressing toward goals ? ?Acute Rehab OT Goals ?Patient Stated Goal: get more rehab ?OT Goal Formulation: With patient ?Time For Goal Achievement: 06/11/21 ?Potential to Achieve Goals: Fair ?ADL Goals ?Pt Will Perform Grooming: with supervision;standing ?Pt Will Perform Lower Body Bathing: with min guard assist;sitting/lateral leans;sit to/from stand ?Pt Will Perform Upper Body Dressing: with supervision;sitting ?Pt Will Perform Lower Body Dressing: with min guard assist;sitting/lateral leans;sit to/from stand ?Pt Will Transfer to Toilet: with min assist;stand pivot transfer ?Pt Will Perform Toileting - Clothing Manipulation and hygiene: with min guard assist;sitting/lateral leans;sit to/from stand ?Pt/caregiver will Perform Home Exercise Program: Increased strength;Both right and left upper extremity;With theraband;With Supervision;With written HEP provided ?Additional ADL Goal #1: Pt will tolerate 3 mins of dynamic standing task with min guard as a precursor for safe ADL performance.  ?Plan Discharge plan remains appropriate   ? ?Co-evaluation ? ? ?   ?  ?  ?  ?  ? ?  ?AM-PAC OT "6 Clicks" Daily Activity     ?Outcome Measure ? ? Help from another person eating meals?: None ?Help from another person taking care of personal grooming?: A Little ?Help from  another person toileting, which includes using toliet, bedpan, or urinal?: A Lot ?Help from another person bathing (including washing, rinsing, drying)?: A Lot ?Help from another person to put on and taking off regular upper body clothing?: A Little ?Help from another person to put on and taking off regular lower body clothing?: A Lot ?6 Click Score: 16 ? ?  ?End of Session Equipment Utilized During Treatment: Gait belt ? ?OT Visit Diagnosis: Unsteadiness on feet (R26.81);Other abnormalities of gait and mobility (R26.89);Muscle weakness (generalized) (M62.81);History of falling (Z91.81) ?Hemiplegia - Right/Left: Right ?Hemiplegia - dominant/non-dominant: Dominant ?Hemiplegia - caused by: Unspecified ?  ?Activity Tolerance Patient tolerated treatment well ?  ?Patient Left in chair;with call bell/phone within reach;with chair alarm set ?  ?Nurse Communication Mobility status ?  ? ?   ? ?Time: 0370-4888 ?OT Time Calculation (min): 28 min ? ?Charges: OT General Charges ?$OT Visit: 1 Visit ?OT Treatments ?$Self Care/Home Management : 8-22 mins ?$Therapeutic Activity: 8-22 mins ? ?Alfonse Flavors, OTA ?Acute Rehabilitation Services  ?Pager [phone] ?Office  [phone] ? ? ?Lindy Garczynski Jeannett Senior ?06/08/2021, 9:39 AM ?

## 2021-06-08 NOTE — TOC Progression Note (Addendum)
Transition of Care (TOC) - Progression Note  ? ? ?Patient Details  ?Name: Logan Nelson ?MRN: [id number] ?Date of Birth: 12/04/1965 ? ?Transition of Care (TOC) CM/SW Contact  ?Curlene Labrum, RN ?Phone Number: ?06/08/2021, 1:50 PM ? ?Clinical Narrative:    ?CM met with the patient at the bedside to discuss patient's likely discharge on Monday, 5/8.  The patient states that he is unable to return to his ex-girlfriend's father's home after discharge from the hospital.  The patient states that the gentleman that he was living with will not let him return to the residence since he is 56 years old and unable to assist the patient with his needs.  The patient states that his ex-girlfriend, Logan Nelson is aware that the patient may likely discharge to a homeless shetler - [place].   ? ?The patient states that he has a rolling walker at his previous residence and that a wheelchair would be preferable if he is discharged to the [facility].  I updated PT/OT on the above note and that the patient may need wheelchair. ? ?PCP follow up will be placed in the follow up instructions. ? ?Discharge medications will need to be provided through [facility] since the patient is homeless and has no insurance/money for medications. ? ?CM and MSW with DTP Team will continue to follow the patient for discharge planning to IRC/shelter. ? ? ?Expected Discharge Plan: [place] ?Barriers to Discharge: Continued Medical Work up, Inadequate or no insurance, SNF Pending payor source - [facility] ? ?Expected Discharge Plan and Services ?Expected Discharge Plan: [place] ?In-house Referral: Clinical Social Work ?Discharge Planning Services: CM Consult ?  ?Living arrangements for the past 2 months: [place] ?                ?  ?  ?  ?  ?  ?  ?  ?  ?  ?  ? ? ?Social Determinants of Health (SDOH) Interventions ?  ? ?Readmission Risk Interventions ? ?  06/08/2021  ?  1:48 PM 05/05/2021  ? 10:01 AM   ?Readmission Risk Prevention Plan  ?Transportation Screening Complete Complete  ?Medication Review (RN Care Manager) Referral to Pharmacy Referral to Pharmacy  ?PCP or Specialist appointment within 3-5 days of discharge Complete Complete  ?[place] or Home Care Consult Patient refused Patient refused  ?SW Recovery Care/Counseling Consult Complete Complete  ?Palliative Care Screening Not Applicable Not Applicable  ?Skilled Nursing Facility Not Applicable Complete  ? ? ?

## 2021-06-08 NOTE — Progress Notes (Signed)
Physical Therapy Treatment ?Patient Details ?Name: Logan Nelson ?MRN: [id number] ?DOB: 11-Dec-1965 ?Today's Date: 06/08/2021 ? ? ?History of Present Illness Pt is a 19 admitted 4/3 with quickly progressing B LE weakness.  Recently hospitalize with incarcerated abdominal hernia, was repaired, but pt left AMA before he was safe to mobilize,  MRI of brain, cervical/thoracic spine inconclusive.  Work up continues.  PMHx: anemia, arthritis, dyspnea, GERD, HTN, LE edema ? ?  ?PT Comments  ? ? Needed encouragement to motivate to participate.  Needed off the 3 in 1, so utilized this for transfer training before progressing gait stability and stamina in the hall. ?   ?Recommendations for follow up therapy are one component of a multi-disciplinary discharge planning process, led by the attending physician.  Recommendations may be updated based on patient status, additional functional criteria and insurance authorization. ? ?Follow Up Recommendations ? Skilled nursing-short term rehab (<3 hours/day) ?  ?  ?Assistance Recommended at Discharge Frequent or constant Supervision/Assistance  ?Patient can return home with the following Assistance with cooking/housework;Direct supervision/assist for medications management;Direct supervision/assist for financial management;Assist for transportation;Help with stairs or ramp for entrance;A little help with walking and/or transfers ?  ?Equipment Recommendations ? Rolling walker (2 wheels)  ?  ?Recommendations for Other Services   ? ? ?  ?Precautions / Restrictions Precautions ?Precautions: Fall ?Precaution Comments: bilat LE edema, R UE edema, distending abdomen ?Restrictions ?Weight Bearing Restrictions: No  ?  ? ?Mobility ? Bed Mobility ?  ?  ?  ?  ?  ?  ?  ?General bed mobility comments: OOB on arrival ?  ? ?Transfers ?Overall transfer level: Needs assistance ?Equipment used: Rolling walker (2 wheels) ?Transfers: Sit to/from Stand ?Sit to Stand: Min guard (multiple times during peri  care.) ?  ?  ?  ?  ?  ?General transfer comment: cues for hand placement/safety ?  ? ?Ambulation/Gait ?Ambulation/Gait assistance: Min assist ?Gait Distance (Feet): 230 Feet ?Assistive device: Rolling walker (2 wheels) ?Gait Pattern/deviations: Step-through pattern ?Gait velocity: decreased ?Gait velocity interpretation: <1.8 ft/sec, indicate of risk for recurrent falls ?  ?General Gait Details: mild unsteadiness, mild "waggle" with RW accompanied by a little excess lateral w/shifting and biased toward more straight-legged gait pattern.  Worked toward improved heel/toe. ? ? ?Stairs ?  ?  ?  ?  ?  ? ? ?Wheelchair Mobility ?  ? ?Modified Rankin (Stroke Patients Only) ?  ? ? ?  ?Balance Overall balance assessment: Needs assistance ?Sitting-balance support: Feet supported, Bilateral upper extremity supported, No upper extremity supported ?Sitting balance-Leahy Scale: Fair ?  ?  ?Standing balance support: Single extremity supported ?Standing balance-Leahy Scale: Poor ?Standing balance comment: reliant still of AD or external support ?  ?  ?  ?  ?  ?  ?  ?  ?  ?  ?  ?  ? ?  ?Cognition Arousal/Alertness: Awake/alert ?Behavior During Therapy: [facility] for tasks assessed/performed ?Overall Cognitive Status: No family/caregiver present to determine baseline cognitive functioning (NT formally) ?  ?  ?  ?  ?  ?  ?  ?  ?  ?  ?  ?  ?  ?  ?  ?  ?  ?  ?  ? ?  ?Exercises   ? ?  ?General Comments General comments (skin integrity, edema, etc.): increased edema on this date in RUE, addressed with retrograde message, dexterity exercises, and positioning ?  ?  ? ?Pertinent Vitals/Pain Pain Assessment ?Pain Assessment: Faces ?Faces Pain Scale:  Hurts a little bit ?Pain Location: R hand and sacral wound with peri care ?Pain Descriptors / Indicators: Grimacing ?Pain Intervention(s): Monitored during session  ? ? ?Home Living   ?  ?  ?  ?  ?  ?  ?  ?  ?  ?   ?  ?Prior Function    ?  ?  ?   ? ?PT Goals (current goals can now be found in the care  plan section) Acute Rehab PT Goals ?PT Goal Formulation: With patient ?Time For Goal Achievement: 06/09/21 ?Potential to Achieve Goals: Good ?Progress towards PT goals: Progressing toward goals ? ?  ?Frequency ? ? ? Min 3X/week ? ? ? ?  ?PT Plan Current plan remains appropriate  ? ? ?Co-evaluation   ?  ?  ?  ?  ? ?  ?AM-PAC PT "6 Clicks" Mobility   ?Outcome Measure ? Help needed turning from your back to your side while in a flat bed without using bedrails?: A Little ?Help needed moving from lying on your back to sitting on the side of a flat bed without using bedrails?: A Little ?Help needed moving to and from a bed to a chair (including a wheelchair)?: A Little ?Help needed standing up from a chair using your arms (e.g., wheelchair or bedside chair)?: A Little ?Help needed to walk in hospital room?: A Little ?Help needed climbing 3-5 steps with a railing? : A Lot ?6 Click Score: 17 ? ?  ?End of Session   ?Activity Tolerance: Patient tolerated treatment well ?Patient left: in chair;with call bell/phone within reach;with chair alarm set ?Nurse Communication: Mobility status ?PT Visit Diagnosis: Other abnormalities of gait and mobility (R26.89);Muscle weakness (generalized) (M62.81) ?  ? ? ?Time: 7619-5093 ?PT Time Calculation (min) (ACUTE ONLY): 17 min ? ?Charges:  $Gait Training: 8-22 mins          ?          ? ?06/08/2021 ? ?Jacinto Halim., PT ?Acute Rehabilitation Services ?[phone]  (pager) ?[phone]  (office) ? ? ?Eliseo Gum Theophil Thivierge ?06/08/2021, 12:12 PM ? ?

## 2021-06-09 LAB — CBC
HCT: 27.1 % — ABNORMAL LOW (ref 39.0–52.0)
Hemoglobin: 8.7 g/dL — ABNORMAL LOW (ref 13.0–17.0)
MCH: 29.4 pg (ref 26.0–34.0)
MCHC: 32.1 g/dL (ref 30.0–36.0)
MCV: 91.6 fL (ref 80.0–100.0)
Platelets: 320 10*3/uL (ref 150–400)
RBC: 2.96 MIL/uL — ABNORMAL LOW (ref 4.22–5.81)
RDW: 15.5 % (ref 11.5–15.5)
WBC: 9.1 10*3/uL (ref 4.0–10.5)
nRBC: 0 % (ref 0.0–0.2)

## 2021-06-09 LAB — RENAL FUNCTION PANEL
Albumin: 2.5 g/dL — ABNORMAL LOW (ref 3.5–5.0)
Anion gap: 7 (ref 5–15)
BUN: 18 mg/dL (ref 6–20)
CO2: 26 mmol/L (ref 22–32)
Calcium: 8.2 mg/dL — ABNORMAL LOW (ref 8.9–10.3)
Chloride: 107 mmol/L (ref 98–111)
Creatinine, Ser: 0.56 mg/dL — ABNORMAL LOW (ref 0.61–1.24)
GFR, Estimated: >60 mL/min (ref >60)
Glucose, Bld: 86 mg/dL (ref 70–99)
Phosphorus: 2.8 mg/dL (ref 2.5–4.6)
Potassium: 3.4 mmol/L — ABNORMAL LOW (ref 3.5–5.1)
Sodium: 140 mmol/L (ref 135–145)

## 2021-06-09 LAB — MAGNESIUM: Magnesium: 1.9 mg/dL (ref 1.7–2.4)

## 2021-06-09 MED ORDER — FUROSEMIDE 10 MG/ML IJ SOLN
40.0000 mg | Freq: Two times a day (BID) | INTRAMUSCULAR | Status: DC
  Administered 2021-06-09 – 2021-06-12 (×6): 40 mg via INTRAVENOUS
  Filled 2021-06-09 (×7): qty 4

## 2021-06-09 MED ORDER — POTASSIUM CHLORIDE CRYS ER 20 MEQ PO TBCR
40.0000 meq | EXTENDED_RELEASE_TABLET | Freq: Once | ORAL | Status: AC
  Administered 2021-06-09: 40 meq via ORAL
  Filled 2021-06-09: qty 2

## 2021-06-09 NOTE — Progress Notes (Signed)
Physical Therapy Treatment ?Patient Details ?Name: Logan Nelson ?MRN: [id number] ?DOB: Dec 07, 1965 ?Today's Date: 06/09/2021 ? ? ?History of Present Illness Pt is a 5 admitted 4/3 with quickly progressing B LE weakness.  Recently hospitalize with incarcerated abdominal hernia, was repaired, but pt left AMA before he was safe to mobilize,  MRI of brain, cervical/thoracic spine inconclusive.  Work up continues.  PMHx: anemia, arthritis, dyspnea, GERD, HTN, LE edema ? ?  ?PT Comments  ? ? Pt was seen for progression of gait today with help to get back to bed after therapy.  Pt is demonstrating a limited awareness of safety and setting limits, seems to be interested in being more mobile and active but is requiring more resting times to walk. Encourage pt to be aware of fatigue to help avoid overdoing and being unable to be safe with a task.  Follow up as ordered for PT acute goals.  ?Recommendations for follow up therapy are one component of a multi-disciplinary discharge planning process, led by the attending physician.  Recommendations may be updated based on patient status, additional functional criteria and insurance authorization. ? ?Follow Up Recommendations ? Skilled nursing-short term rehab (<3 hours/day) ?  ?  ?Assistance Recommended at Discharge Frequent or constant Supervision/Assistance  ?Patient can return home with the following Assistance with cooking/housework;Direct supervision/assist for medications management;Direct supervision/assist for financial management;Assist for transportation;Help with stairs or ramp for entrance;A little help with walking and/or transfers ?  ?Equipment Recommendations ? Rolling walker (2 wheels)  ?  ?Recommendations for Other Services   ? ? ?  ?Precautions / Restrictions Precautions ?Precautions: Fall ?Precaution Comments: bilat LE edema, R UE edema, distending abdomen ?Restrictions ?Weight Bearing Restrictions: No  ?  ? ?Mobility ? Bed Mobility ?Overal bed mobility:  Needs Assistance ?Bed Mobility: Sit to Supine ?  ?  ?  ?Sit to supine: Mod assist ?  ?General bed mobility comments: assist of legs to get to bed ?  ? ?Transfers ?Overall transfer level: Needs assistance ?Equipment used: Rolling walker (2 wheels) ?Transfers: Sit to/from Stand ?Sit to Stand: Min assist ?  ?  ?  ?  ?  ?General transfer comment: min assist to power up with RUE ?  ? ?Ambulation/Gait ?Ambulation/Gait assistance: Min guard ?Gait Distance (Feet): 150 Feet ?Assistive device: Rolling walker (2 wheels) ?Gait Pattern/deviations: Step-through pattern, Decreased stride length, Wide base of support ?Gait velocity: decreased ?Gait velocity interpretation: <1.31 ft/sec, indicative of household ambulator ?Pre-gait activities: standing stability with help to get up to walker ?General Gait Details: pt is slow with progression of walker but able to maneuver without help to turn and direct walker ? ? ?Stairs ?  ?  ?  ?  ?  ? ? ?Wheelchair Mobility ?  ? ?Modified Rankin (Stroke Patients Only) ?  ? ? ?  ?Balance Overall balance assessment: Needs assistance ?Sitting-balance support: Feet supported ?Sitting balance-Leahy Scale: Fair ?  ?Postural control: Posterior lean ?Standing balance support: Bilateral upper extremity supported ?Standing balance-Leahy Scale: Poor ?Standing balance comment: requires walker but is putting very little pressure on it ?  ?  ?  ?  ?  ?  ?  ?  ?  ?  ?  ?  ? ?  ?Cognition Arousal/Alertness: Awake/alert ?Behavior During Therapy: [facility] for tasks assessed/performed ?Overall Cognitive Status: No family/caregiver present to determine baseline cognitive functioning ?Area of Impairment: Safety/judgement, Awareness, Attention ?  ?  ?  ?  ?  ?  ?  ?  ?  ?Current  Attention Level: Selective ?Memory: Decreased short-term memory ?Following Commands: Follows one step commands with increased time ?Safety/Judgement: Decreased awareness of safety ?Awareness: Intellectual ?Problem Solving: Requires verbal cues,  Requires tactile cues ?  ?  ?  ? ?  ?Exercises   ? ?  ?General Comments General comments (skin integrity, edema, etc.): pt is getting up to walk with a little help to stand but then PT is just min guard to maintain safety with three very short standing rests ?  ?  ? ?Pertinent Vitals/Pain Pain Assessment ?Pain Assessment: Faces ?Faces Pain Scale: Hurts a little bit ?Pain Location: R hand and sacral wound with peri care ?Pain Descriptors / Indicators: Guarding ?Pain Intervention(s): Monitored during session, Repositioned  ? ? ?Home Living   ?  ?  ?  ?  ?  ?  ?  ?  ?  ?   ?  ?Prior Function    ?  ?  ?   ? ?PT Goals (current goals can now be found in the care plan section) Acute Rehab PT Goals ?Patient Stated Goal: return to independence ?Progress towards PT goals: Progressing toward goals ? ?  ?Frequency ? ? ? Min 3X/week ? ? ? ?  ?PT Plan Current plan remains appropriate  ? ? ?Co-evaluation   ?  ?  ?  ?  ? ?  ?AM-PAC PT "6 Clicks" Mobility   ?Outcome Measure ? Help needed turning from your back to your side while in a flat bed without using bedrails?: A Little ?Help needed moving from lying on your back to sitting on the side of a flat bed without using bedrails?: A Little ?Help needed moving to and from a bed to a chair (including a wheelchair)?: A Little ?Help needed standing up from a chair using your arms (e.g., wheelchair or bedside chair)?: A Little ?Help needed to walk in hospital room?: A Little ?Help needed climbing 3-5 steps with a railing? : A Lot ?6 Click Score: 17 ? ?  ?End of Session Equipment Utilized During Treatment: Gait belt ?Activity Tolerance: Patient tolerated treatment well;Patient limited by fatigue ?Patient left: in bed;with call bell/phone within reach ?Nurse Communication: Mobility status ?PT Visit Diagnosis: Other abnormalities of gait and mobility (R26.89);Muscle weakness (generalized) (M62.81) ?Pain - part of body: Arm;Hand ?  ? ? ?Time: [id number] ?PT Time Calculation (min) (ACUTE ONLY):  23 min ? ?Charges:  $Gait Training: 8-22 mins ?$Therapeutic Activity: 8-22 mins    ?Ramond Dial ?06/09/2021, 6:06 PM ? ?Mee Hives, PT PhD ?Acute Rehab Dept. Number: [facility] [id number] and [place] [phone] ? ? ?

## 2021-06-09 NOTE — Progress Notes (Signed)
?  Progress Note ?Patient: Logan Nelson [id number] DOB: September 09, 1965 DOA: 05/10/2021  ?DOS: the patient was seen and examined on 06/09/2021 ? ?Brief hospital course: ?[name] is a 56 y.o. male with past medical history of severe protein calorie malnutrition, hypertension, anasarca, abdominal wall hernia presented to the hospital with bilateral lower extremity weakness.   ?Found to have failure to thrive with protein calorie malnutrition currently improving. ? ?Assessment and Plan: ?Generalized weakness. ?Deconditioning. ?Likely from malnutrition. ?Neurology was consulted. ?MRI of the spine shows some enhancement neurosurgery was consulted felt nonspecific findings. ?B1 normal.  D relatively low.  Vitamin a relatively low.  B12 452. ?Continue supplementation. ?Continue PT OT who recommends SNF. ? ?Severe protein calorie malnutrition. ?Present on admission. ?Dietitian consulted. ?Patient eating well. ?Continue current regimen. ? ?Anasarca. ?Hypoalbuminemia with third spacing. ?Currently receiving IV Lasix.  Will monitor. ? ?Recently repair of incarcerated ventral incisional hernia. ?Monitor for now. ? ?HTN. ?Blood pressure stable. ?Continue current regimen. ? ?Anemia of chronic disease. ?Monitor H&H as needed. ?Transfer hemoglobin less than 7. ?So far 3 PRBC transfusion. ?GI was consulted, recommend no indication for intervention but reevaluation close to discharge for EGD colonoscopy. ? ?Electrolyte abnormality. ?Hypokalemia, hypocalcemia, hypophosphatemia, hypomagnesemia. ?Currently being replaced. ?Monitor. ? ?Pressure ulcer stage II, present on admission at left buttock. ?Continue wound care. ? ?Subjective: Patient reports weakness that he is not able to stand up from a chair but once he is able to stand up he is able to walk without any weakness.  No focal deficit.  No nausea or vomiting.  Still concerned with swelling everywhere. ? ?Physical Exam: ?Vitals:  ? 06/09/21 0324 06/09/21 0643 06/09/21  1226 06/09/21 2007  ?BP: (!) 148/97  126/70 (!) 141/97  ?Pulse: 77  85 91  ?Resp: 20  19 17   ?Temp: 97.8 ?F (36.6 ?C)  97.6 ?F (36.4 ?C) 97.9 ?F (36.6 ?C)  ?TempSrc: Oral  Oral Oral  ?SpO2: 96%  100% 99%  ?Weight:  69.9 kg    ?Height:      ? ?General: Appear in mild distress; no visible Abnormal Neck Mass Or lumps, Conjunctiva normal ?Cardiovascular: S1 and S2 Present, NO Murmur, ?Respiratory: good respiratory effort, Bilateral Air entry present and CTA, no Crackles, no wheezes ?Abdomen: Bowel Sound present, Non tender ?Extremities: Generalized diffuse upper extremity and pedal edema ?Neurology: alert and oriented to time, place, and person ?Gait not checked due to patient safety concerns  ? ?Data Reviewed: ?I have Reviewed nursing notes, Vitals, and Lab results since pt's last encounter. Pertinent lab results CBC and BMP ?I have ordered test including CBC and BMP   ? ?Family Communication: None at bedside ? ?Disposition: ?Status is: Inpatient ?Remains inpatient appropriate because: Needing IV diuresis. ? ?Author: ?Berle Mull, MD ?06/09/2021 8:42 PM ? ?Please look on [email or web address] to find out who is on call. ?

## 2021-06-10 LAB — BASIC METABOLIC PANEL
Anion gap: 7 (ref 5–15)
BUN: 17 mg/dL (ref 6–20)
CO2: 26 mmol/L (ref 22–32)
Calcium: 8.4 mg/dL — ABNORMAL LOW (ref 8.9–10.3)
Chloride: 106 mmol/L (ref 98–111)
Creatinine, Ser: 0.52 mg/dL — ABNORMAL LOW (ref 0.61–1.24)
GFR, Estimated: >60 mL/min (ref >60)
Glucose, Bld: 76 mg/dL (ref 70–99)
Potassium: 3.9 mmol/L (ref 3.5–5.1)
Sodium: 139 mmol/L (ref 135–145)

## 2021-06-10 LAB — MAGNESIUM: Magnesium: 1.6 mg/dL — ABNORMAL LOW (ref 1.7–2.4)

## 2021-06-10 LAB — C-REACTIVE PROTEIN: CRP: 4.8 mg/dL — ABNORMAL HIGH (ref <1.0)

## 2021-06-10 LAB — SEDIMENTATION RATE: Sed Rate: 6 mm/hr (ref 0–16)

## 2021-06-10 LAB — CK: Total CK: 28 U/L — ABNORMAL LOW (ref 49–397)

## 2021-06-10 MED ORDER — SODIUM CHLORIDE 0.9 % IV SOLN
250.0000 mg | Freq: Every day | INTRAVENOUS | Status: AC
  Administered 2021-06-10 – 2021-06-11 (×2): 250 mg via INTRAVENOUS
  Filled 2021-06-10 (×2): qty 20

## 2021-06-10 MED ORDER — VITAMIN B-12 1000 MCG PO TABS
1000.0000 ug | ORAL_TABLET | Freq: Every day | ORAL | Status: DC
  Administered 2021-06-11 – 2021-06-16 (×6): 1000 ug via ORAL
  Filled 2021-06-10 (×6): qty 1

## 2021-06-10 MED ORDER — FOLIC ACID 1 MG PO TABS
1.0000 mg | ORAL_TABLET | Freq: Every day | ORAL | Status: DC
  Administered 2021-06-10 – 2021-06-16 (×7): 1 mg via ORAL
  Filled 2021-06-10 (×7): qty 1

## 2021-06-10 MED ORDER — ACETAMINOPHEN 325 MG PO TABS
650.0000 mg | ORAL_TABLET | Freq: Four times a day (QID) | ORAL | Status: DC | PRN
  Administered 2021-06-10 – 2021-06-14 (×4): 650 mg via ORAL
  Filled 2021-06-10 (×4): qty 2

## 2021-06-10 MED ORDER — CYANOCOBALAMIN 1000 MCG/ML IJ SOLN
1000.0000 ug | Freq: Once | INTRAMUSCULAR | Status: AC
  Administered 2021-06-10: 1000 ug via SUBCUTANEOUS
  Filled 2021-06-10: qty 1

## 2021-06-10 MED ORDER — ALBUMIN HUMAN 25 % IV SOLN
12.5000 g | Freq: Once | INTRAVENOUS | Status: AC
  Administered 2021-06-10: 12.5 g via INTRAVENOUS
  Filled 2021-06-10: qty 50

## 2021-06-10 MED ORDER — ACETAMINOPHEN 650 MG RE SUPP: 650.0000 mg | Freq: Four times a day (QID) | RECTAL | Status: DC | PRN

## 2021-06-10 MED ORDER — MAGNESIUM SULFATE 2 GM/50ML IV SOLN
2.0000 g | Freq: Once | INTRAVENOUS | Status: AC
  Administered 2021-06-10: 2 g via INTRAVENOUS
  Filled 2021-06-10: qty 50

## 2021-06-10 MED ORDER — POTASSIUM CHLORIDE CRYS ER 20 MEQ PO TBCR
20.0000 meq | EXTENDED_RELEASE_TABLET | Freq: Every day | ORAL | Status: DC
  Administered 2021-06-10 – 2021-06-16 (×7): 20 meq via ORAL
  Filled 2021-06-10 (×7): qty 1

## 2021-06-10 MED ORDER — VITAMIN A 3 MG (10000 UNIT) PO CAPS
10000.0000 [IU] | ORAL_CAPSULE | Freq: Every day | ORAL | Status: DC
  Administered 2021-06-11 – 2021-06-15 (×5): 10000 [IU] via ORAL
  Filled 2021-06-10 (×5): qty 1

## 2021-06-10 NOTE — Progress Notes (Signed)
?Progress Note ?Patient: Logan Nelson [id number] DOB: 12/25/1965 DOA: 05/10/2021  ?DOS: the patient was seen and examined on 06/10/2021 ? ?Brief hospital course: ?Cardale Warden Buffa is a 56 y.o. male with past medical history of severe protein calorie malnutrition, hypertension, anasarca, abdominal wall hernia presented to the hospital with bilateral lower extremity weakness.   ?Found to have failure to thrive with protein calorie malnutrition currently improving. ?Assessment and Plan: ?Right-sided weakness. ?Deconditioning. ?Presents with multiple falls.  Weakness appears to be more on the right upper extremity. ?Neurology was consulted.  Unable to identify and pinpoint an etiology based on the work-up recommend outpatient follow-up and treatment of malnutrition. ?MRIs were done, C-spine was concerning for enhancement for infection, neurosurgery was consulted and they felt that there is no evidence of infection and no further work-up was recommended. ?B1 normal, B12 low normal, vitamin D low, vitamin A low.  All currently being replaced. ?ESR normal.  CRP mildly elevated but improving.  CK normal.  TSH normal.  Phosphorus level was low but now replaced. ?PT OT consulted recommended SNF although patient is able to ambulate once he stands up. ?Currently looking for placement at a shelter once volume overload is treated. ?Brachial plexus issue cannot be ruled out. ?Outpatient EMG recommended for right upper extremity. ?  ?Adult failure to thrive. ?Hypoalbuminemia ?Severe protein calorie malnutrition. ?Body mass index is 24.89 kg/m?Marland Kitchen ?Nutrition Problem: Severe Malnutrition ?Etiology: chronic illness ?Nutrition Interventions: ?Interventions: MVI, Boost Breeze, Refer to RD note for recommendations, Other (Comment), Magic cup (vitamin D, double protein) Present on admission. ?Dietitian consulted. ?CT abdomen and pelvis without any acute abnormality but distended stomach. ?We will switch to scheduled simethicone  and repeat x-ray abdomen tomorrow. ?Patient did have some diarrhea intermittently, unsure of malabsorption syndrome/inflammatory bowel condition. ?[facility] consulted.  Recommend colonoscopy and EGD once he is ambulatory with improvement in nutritional status. ?Outpatient follow-up with GI recommended. ?  ?Anasarca. ?Hypoalbuminemia with third spacing. ?Currently receiving IV Lasix.  Will monitor. ?  ?Recently repair of incarcerated ventral incisional hernia. ?CT abdomen unremarkable for any acute abnormality.  Monitor for now. ?  ?HTN. ?Blood pressure stable. ?Continue current regimen. ?  ?Iron deficiency anemia likely from nutritional deficiency. ?Monitor H&H as needed. ?Transfer hemoglobin less than 7. ?So far 3 PRBC transfusion. ?GI was consulted.  No active bleeding seen. ?We will treat with IV iron, subcutaneous B12 injection and B complex. ? ?Hypokalemia, hypocalcemia, hypophosphatemia, hypomagnesemia. ?Currently being replaced. ?Monitor. ?  ?Pressure ulcer stage II, present on admission at left buttock. ?Continue wound care. ?Currently receiving Dakin's ? ?Subjective: No nausea or vomiting no fever no chills.  Continues to have fatigue and tiredness.  Continues to have weakness on the right side. ? ?Physical Exam: ?Vitals:  ? 06/09/21 2007 06/09/21 2345 06/10/21 0357 06/10/21 1559  ?BP: (!) 141/97 (!) 137/99 (!) 149/102 115/80  ?Pulse: 91 90 73 80  ?Resp: _0 ?Temp: 97.9 ?F (36.6 ?C) 98.3 ?F (36.8 ?C) 98.8 ?F (37.1 ?C) 98.6 ?F (37 ?C)  ?TempSrc: Oral  Oral Oral  ?SpO2: 99% 99% 100% 100%  ?Weight:      ?Height:      ? ?General: Appear in mild distress; no visible Abnormal Neck Mass Or lumps, Conjunctiva normal ?Cardiovascular: S1 and S2 Present, no Murmur, ?Respiratory: good respiratory effort, Bilateral Air entry present and CTA, no Crackles, no wheezes ?Abdomen: Bowel Sound present, Non tender, distended ?Extremities: bilateral Pedal edema ?Neurology: alert and oriented to time, place, and  person ?Gait not checked due to patient safety concerns  ? ?Data Reviewed: ?I have Reviewed nursing notes, Vitals, and Lab results since pt's last encounter. Pertinent lab results BMP, magnesium ?I have ordered test including CK, CRP, ESR ?I have ordered imaging studies right upper extremity Doppler.  ? ?Family Communication: No family at bedside ? ?Disposition: ?Status is: Inpatient ?Remains inpatient appropriate because: Continue aggressive diuresis with IV Lasix ?Author: ?Berle Mull, MD ?06/10/2021 6:43 PM ? ?Please look on [email or web address] to find out who is on call. ?

## 2021-06-10 NOTE — Progress Notes (Signed)
?  ?  Durable Medical Equipment  ?(From admission, onward)  ?  ? ? ?  ? ?  Start     Ordered  ? 06/10/21 1455  For home use only DME lightweight manual wheelchair with seat cushion  Once       ?Comments: Patient suffers from bilateral lower extremity weakness which impairs their ability to perform daily activities like toileting in the home.  A walker will not resolve  ?issue with performing activities of daily living. A wheelchair will allow patient to safely perform daily activities. Patient is not able to propel themselves in the home using a standard weight wheelchair due to general weakness. Patient can self propel in the lightweight wheelchair. Length of need 12 months . ?Accessories: elevating leg rests (ELRs), wheel locks, extensions and anti-tippers.  ? 06/10/21 1455  ? ?  ?  ? ?  ?  ?

## 2021-06-10 NOTE — TOC Progression Note (Addendum)
Transition of Care (TOC) - Progression Note  ? ? ?Patient Details  ?Name: Logan Nelson ?MRN: [id number] ?Date of Birth: 09/01/1965 ? ?Transition of Care (TOC) CM/SW Contact  ?Curlene Labrum, RN ?Phone Number: ?06/10/2021, 2:56 PM ? ?Clinical Narrative:    ?CM met with the patient at the bedside and the patient will need a wheelchair before he is discharged from the hospital.  PT/OT has requested in therapy notes.  I called and spoke with Zack, CM with [employer] and charity WC was ordered after orders and dme note was placed.  Blank, CM states that the patient has a rolling walker but does not have history of having a wheelchair - Olga Coaster, supervisor notified and [facility] was approved through [place] for patient. ? ?WC will be delivered to the room this week prior to discharge to the homeless shelter on Monday, 06/13/2021. ? ?The patient states that his ex-girlfriend - listed in the chart along with her father are unable to provide housing for the patient.  The patient requests that [employer] be contacted so that he is placed on the shelter list for housing. ? ?Medicaid application/ disability screen is being followed by financial counseling at this time. ? ?CM and MSW with DTP Team will continue to follow the patient for discharge planning. ? ? ?Expected Discharge Plan: [place] ?Barriers to Discharge: Continued Medical Work up, Inadequate or no insurance, SNF Pending payor source - LOG ? ?Expected Discharge Plan and Services ?Expected Discharge Plan: [place] ?In-house Referral: Clinical Social Work ?Discharge Planning Services: CM Consult ?  ?Living arrangements for the past 2 months: [place] ?                ?  ?  ?  ?  ?  ?  ?  ?  ?  ?  ? ? ?Social Determinants of Health (SDOH) Interventions ?  ? ?Readmission Risk Interventions ? ?  06/08/2021  ?  1:48 PM 05/05/2021  ? 10:01 AM  ?Readmission Risk Prevention Plan  ?Transportation Screening Complete  Complete  ?Medication Review (RN Care Manager) Referral to Pharmacy Referral to Pharmacy  ?PCP or Specialist appointment within 3-5 days of discharge Complete Complete  ?[place] or Home Care Consult Patient refused Patient refused  ?SW Recovery Care/Counseling Consult Complete Complete  ?Palliative Care Screening Not Applicable Not Applicable  ?Skilled Nursing Facility Not Applicable Complete  ? ? ?

## 2021-06-10 NOTE — Progress Notes (Addendum)
Occupational Therapy Treatment ?Patient Details ?Name: Logan Nelson ?MRN: [id number] ?DOB: Jun 30, 1965 ?Today's Date: 06/10/2021 ? ? ?History of present illness Pt is a 86 admitted 4/3 with quickly progressing B LE weakness.  Recently hospitalize with incarcerated abdominal hernia, was repaired, but pt left AMA before he was safe to mobilize,  MRI of brain, cervical/thoracic spine inconclusive.  Work up continues.  PMHx: anemia, arthritis, dyspnea, GERD, HTN, LE edema ?  ?OT comments ? Patient received in recliner. Retrograde massage performed to RUE hand to address edema. Patient performed mobility with RW with min assist to stand and min guard for safety when up. After returning to recliner patient had BM in recliner and stood for cleaning and gown change. Patient demonstrating increased gross grasp with RUE but is limited with [facility]. Acute OT to continue to follow.   ? ?Recommendations for follow up therapy are one component of a multi-disciplinary discharge planning process, led by the attending physician.  Recommendations may be updated based on patient status, additional functional criteria and insurance authorization. ?   ?Follow Up Recommendations ? Skilled nursing-short term rehab (<3 hours/day)  ?  ?Assistance Recommended at Discharge Frequent or constant Supervision/Assistance  ?Patient can return home with the following ? A lot of help with walking and/or transfers;A lot of help with bathing/dressing/bathroom;Assist for transportation;Assistance with cooking/housework ?  ?Equipment Recommendations ? BSC/3in1;Tub/shower bench, wheelchair  ?  ?Recommendations for Other Services   ? ?  ?Precautions / Restrictions Precautions ?Precautions: Fall ?Precaution Comments: bilat LE edema, R UE edema, distending abdomen ?Restrictions ?Weight Bearing Restrictions: No  ? ? ?  ? ?Mobility Bed Mobility ?Overal bed mobility: Needs Assistance ?  ?  ?  ?  ?  ?  ?General bed mobility comments: up in recliner upon entry ?   ? ?Transfers ?Overall transfer level: Needs assistance ?Equipment used: Rolling walker (2 wheels) ?Transfers: Sit to/from Stand ?Sit to Stand: Min assist ?  ?  ?Step pivot transfers: Min guard ?  ?  ?General transfer comment: min assist to power up with RUE ?  ?  ?Balance Overall balance assessment: Needs assistance ?Sitting-balance support: Feet supported ?Sitting balance-Leahy Scale: Fair ?Sitting balance - Comments: up in recliner ?  ?Standing balance support: Bilateral upper extremity supported ?Standing balance-Leahy Scale: Poor ?Standing balance comment: reliant on RW for support ?  ?  ?  ?  ?  ?  ?  ?  ?  ?  ?  ?   ? ?ADL either performed or assessed with clinical judgement  ? ?ADL Overall ADL's : Needs assistance/impaired ?  ?  ?Grooming: Set up;Sitting ?Grooming Details (indicate cue type and reason): in recliner ?  ?  ?Lower Body Bathing: Maximal assistance;Sit to/from stand ?Lower Body Bathing Details (indicate cue type and reason): stood for peri area cleaning after having BM in recliner ?Upper Body Dressing : Supervision/safety;Sitting ?Upper Body Dressing Details (indicate cue type and reason): changed gown in recliner ?  ?  ?  ?  ?  ?  ?  ?  ?  ?  ?  ? ?Extremity/Trunk Assessment Upper Extremity Assessment ?RUE Deficits / Details: increased functional grasp ?  ?  ?  ?  ?  ? ?Vision   ?  ?  ?Perception   ?  ?Praxis   ?  ? ?Cognition Arousal/Alertness: Awake/alert ?Behavior During Therapy: [facility] for tasks assessed/performed ?Overall Cognitive Status: No family/caregiver present to determine baseline cognitive functioning ?Area of Impairment: Safety/judgement, Awareness, Attention ?  ?  ?  ?  ?  ?  ?  ?  ?  ?  Current Attention Level: Selective ?Memory: Decreased short-term memory ?Following Commands: Follows one step commands with increased time ?Safety/Judgement: Decreased awareness of safety ?Awareness: Intellectual ?Problem Solving: Requires verbal cues, Requires tactile cues ?  ?  ?  ?   ?Exercises    ? ?  ?Shoulder Instructions   ? ? ?  ?General Comments retrograde massage to RUE hand  ? ? ?Pertinent Vitals/ Pain       Pain Assessment ?Pain Assessment: Faces ?Faces Pain Scale: Hurts a little bit ?Pain Location: R hand and sacral wound with peri care ?Pain Descriptors / Indicators: Grimacing, Guarding ?Pain Intervention(s): Monitored during session, Repositioned ? ?Home Living   ?  ?  ?  ?  ?  ?  ?  ?  ?  ?  ?  ?  ?  ?  ?  ?  ?  ?  ? ?  ?Prior Functioning/Environment    ?  ?  ?  ?   ? ?Frequency ? Min 2X/week  ? ? ? ? ?  ?Progress Toward Goals ? ?OT Goals(current goals can now be found in the care plan section) ? Progress towards OT goals: Progressing toward goals ? ?Acute Rehab OT Goals ?Patient Stated Goal: go home ?OT Goal Formulation: With patient ?Time For Goal Achievement: 06/11/21 ?Potential to Achieve Goals: Fair ?ADL Goals ?Pt Will Perform Grooming: with supervision;standing ?Pt Will Perform Lower Body Bathing: with min guard assist;sitting/lateral leans;sit to/from stand ?Pt Will Perform Upper Body Dressing: with supervision;sitting ?Pt Will Perform Lower Body Dressing: with min guard assist;sitting/lateral leans;sit to/from stand ?Pt Will Transfer to Toilet: with min assist;stand pivot transfer ?Pt Will Perform Toileting - Clothing Manipulation and hygiene: with min guard assist;sitting/lateral leans;sit to/from stand ?Pt/caregiver will Perform Home Exercise Program: Increased strength;Both right and left upper extremity;With theraband;With Supervision;With written HEP provided ?Additional ADL Goal #1: Pt will tolerate 3 mins of dynamic standing task with min guard as a precursor for safe ADL performance.  ?Plan Discharge plan remains appropriate   ? ?Co-evaluation ? ? ?   ?  ?  ?  ?  ? ?  ?AM-PAC OT "6 Clicks" Daily Activity     ?Outcome Measure ? ? Help from another person eating meals?: None ?Help from another person taking care of personal grooming?: A Little ?Help from another person toileting,  which includes using toliet, bedpan, or urinal?: A Lot ?Help from another person bathing (including washing, rinsing, drying)?: A Lot ?Help from another person to put on and taking off regular upper body clothing?: A Little ?Help from another person to put on and taking off regular lower body clothing?: A Lot ?6 Click Score: 16 ? ?  ?End of Session Equipment Utilized During Treatment: Gait belt;Rolling walker (2 wheels) ? ?OT Visit Diagnosis: Unsteadiness on feet (R26.81);Other abnormalities of gait and mobility (R26.89);Muscle weakness (generalized) (M62.81);History of falling (Z91.81) ?Hemiplegia - Right/Left: Right ?Hemiplegia - dominant/non-dominant: Dominant ?Hemiplegia - caused by: Unspecified ?  ?Activity Tolerance Patient tolerated treatment well ?  ?Patient Left in chair;with call bell/phone within reach;with chair alarm set ?  ?Nurse Communication Mobility status ?  ? ?   ? ?Time: 0821-0902 ?OT Time Calculation (min): 41 min ? ?Charges: OT General Charges ?$OT Visit: 1 Visit ?OT Treatments ?$Self Care/Home Management : 38-52 mins ? ?Lodema Hong, OTA ?Acute Rehabilitation Services  ?Pager [phone] ?Office [phone] ? Addendum to add wheelchair for discharge recommendation ? ?[place] ?06/10/2021, 10:32 AM ?

## 2021-06-11 ENCOUNTER — Inpatient Hospital Stay (HOSPITAL_COMMUNITY): Payer: Medicaid Other

## 2021-06-11 DIAGNOSIS — R609 Edema, unspecified: Secondary | ICD-10-CM

## 2021-06-11 LAB — MAGNESIUM: Magnesium: 1.8 mg/dL (ref 1.7–2.4)

## 2021-06-11 LAB — BASIC METABOLIC PANEL
Anion gap: 6 (ref 5–15)
BUN: 13 mg/dL (ref 6–20)
CO2: 28 mmol/L (ref 22–32)
Calcium: 8.2 mg/dL — ABNORMAL LOW (ref 8.9–10.3)
Chloride: 106 mmol/L (ref 98–111)
Creatinine, Ser: 0.44 mg/dL — ABNORMAL LOW (ref 0.61–1.24)
GFR, Estimated: >60 mL/min (ref >60)
Glucose, Bld: 81 mg/dL (ref 70–99)
Potassium: 3.5 mmol/L (ref 3.5–5.1)
Sodium: 140 mmol/L (ref 135–145)

## 2021-06-11 MED ORDER — KETOROLAC TROMETHAMINE 15 MG/ML IJ SOLN
15.0000 mg | Freq: Once | INTRAMUSCULAR | Status: AC
  Administered 2021-06-12: 15 mg via INTRAVENOUS
  Filled 2021-06-11: qty 1

## 2021-06-11 MED ORDER — METHOCARBAMOL 500 MG PO TABS
500.0000 mg | ORAL_TABLET | Freq: Three times a day (TID) | ORAL | Status: DC
  Administered 2021-06-11 (×2): 500 mg via ORAL
  Filled 2021-06-11 (×3): qty 1

## 2021-06-11 NOTE — Progress Notes (Signed)
?Progress Note ?Patient: Logan Nelson [id number]:[id number] DOB: 05-23-1965 DOA: 05/10/2021  ?DOS: the patient was seen and examined on 06/11/2021 ? ?Brief hospital course: ?Logan Nelson is a 56 y.o. male with past medical history of severe protein calorie malnutrition, hypertension, anasarca, abdominal wall hernia presented to the hospital with multiple falls and swelling. ?Found to have failure to thrive with protein calorie malnutrition currently improving. ?Assessment and Plan: ?Right-sided weakness. ?Deconditioning. ?Presents with multiple falls.  Weakness appears to be more on the right upper extremity. ?Neurology was consulted.  Unable to identify and pinpoint an etiology based on the work-up recommend outpatient follow-up and treatment of malnutrition. ?MRIs were done, C-spine was concerning for enhancement for infection, neurosurgery was consulted and they felt that there is no evidence of infection and no further work-up was recommended. ?B1 normal, B12 low normal, vitamin D low, vitamin A low.  All currently being replaced. ?ESR normal.  CRP mildly elevated but improving.  CK normal.  TSH normal.  Phosphorus level was low but now replaced. ?PT OT consulted recommended SNF although patient is able to ambulate once he stands up. ?Currently looking for placement at a shelter once volume overload is treated. ?Brachial plexus issue cannot be ruled out. ?Outpatient EMG recommended for right upper extremity. ?  ?Adult failure to thrive. ?Hypoalbuminemia ?Severe protein calorie malnutrition. ?Body mass index is 24.89 kg/m?Marland Kitchen ?Nutrition Problem: Severe Malnutrition ?Etiology: chronic illness ?Nutrition Interventions: ?Interventions: MVI, Boost Breeze, Refer to RD note for recommendations, Other (Comment), Magic cup (vitamin D, double protein) Present on admission. ?Dietitian consulted. ?CT abdomen and pelvis without any acute abnormality but distended stomach. ?We will switch to scheduled simethicone and repeat  x-ray abdomen tomorrow. ?Patient did have some diarrhea intermittently, unsure of malabsorption syndrome/inflammatory bowel condition. ?[facility] consulted.  Recommend colonoscopy and EGD once he is ambulatory with improvement in nutritional status. ?Outpatient follow-up with GI recommended. ?  ?Anasarca. ?Hypoalbuminemia with third spacing. ?Currently receiving IV Lasix.  Will monitor. ?  ?Recently repair of incarcerated ventral incisional hernia. ?CT abdomen unremarkable for any acute abnormality.  Monitor for now. ?  ?HTN. ?Blood pressure stable. ?Continue current regimen. ?  ?Iron deficiency anemia likely from nutritional deficiency. ?Monitor H&H as needed. ?Transfer hemoglobin less than 7. ?So far 3 PRBC transfusion. ?GI was consulted.  No active bleeding seen. ?We will treat with IV iron, subcutaneous B12 injection and B complex. ?  ?Hypokalemia, hypocalcemia, hypophosphatemia, hypomagnesemia. ?Currently being replaced. ?Monitor. ?  ?Pressure ulcer stage II, present on admission at left buttock. ?Continue wound care. ?Currently receiving Dakin's ? ?Subjective: No nausea no vomiting no fever no chills.  Continues to have weakness in the right side.  Reports that he actually started having some neck pain last night on the left as well. ? ?Physical Exam: ?Vitals:  ? 06/10/21 2311 06/11/21 0349 06/11/21 1112 06/11/21 1543  ?BP: 130/84 135/83 134/89 132/86  ?Pulse: 90 85 84 100  ?Resp: 18 (!) 21 18 20   ?Temp: 98.9 ?F (37.2 ?C) 98.8 ?F (37.1 ?C) 98.4 ?F (36.9 ?C) 98.1 ?F (36.7 ?C)  ?TempSrc:  Oral  Oral  ?SpO2: 97% 97% 99% 99%  ?Weight:      ?Height:      ? ?General: Appear in mild distress; no visible Abnormal Neck Mass Or lumps, Conjunctiva normal ?Cardiovascular: S1 and S2 Present, no Murmur, ?Respiratory: good respiratory effort, Bilateral Air entry present and CTA, no Crackles, no wheezes ?Abdomen: Bowel Sound present, Non tender ?Extremities: Right upper extremity edema, bilateral pedal  edema ?Neurology:  alert and oriented to time, place, and person ?Gait not checked due to patient safety concerns  ? ?Data Reviewed: ?I have Reviewed nursing notes, Vitals, and Lab results since pt's last encounter. Pertinent lab results BMP and magnesium ?I have ordered test including BMP and magnesium   ? ?Family Communication: None at bedside ? ?Disposition: ?Status is: Inpatient ?Remains inpatient appropriate because: Continues to require IV diuresis. ? ?Author: ?Berle Mull, MD ?06/11/2021 7:40 PM ? ?Please look on [email or web address] to find out who is on call. ?

## 2021-06-11 NOTE — TOC Progression Note (Addendum)
Transition of Care (TOC) - Progression Note  ? ? ?Patient Details  ?Name: Vicente Weidler ?MRN: [id number] ?Date of Birth: August 08, 1965 ? ?Transition of Care (TOC) CM/SW Contact  ?Curlene Labrum, RN ?Phone Number: ?06/11/2021, 8:18 AM ? ?Clinical Narrative:    ?CM called [facility] [employer] this morning and left a message with the admissions department, [place] requesting follow up with the patient regarding shelter needs for next week - if patient is able to discharge.  Shelter Resource guide will be given to the patient to contact area shelters himself since he is unable to stay with family/friends. ? ?[employer] will deliver the patient's wheelchair to the hospital room this weekend as ordered. ? ?06/11/2021 - 0800 I met with the patient this morning and instructed the patient to call the [facility] shelter and place himself on the waiting list at the shelter's request. ? ?06/11/2021 1430 - I called and spoke with the patient's ex-girlfriend, [name] on the phone and she states that she and her friends and family "have all tried to assist him but they are not going to help him or provide shelter of assistance to the patient anymore".  Allean Found states that she is aware that the patient will likely be sent to the area shelter - [facility] for homeless assistance and resources.  She states that she has encouraged him to place his name on the waiting list for the [facility] and the patient is aware and has a cell phone and hospital phone to use to place calls from the provided shelter list. ? ?CM and MSW with [facility] Will continue to follow the patient for discharge needs. ? ? ?Expected Discharge Plan: [place] ?Barriers to Discharge: Continued Medical Work up, Inadequate or no insurance, SNF Pending payor source - LOG ? ?Expected Discharge Plan and Services ?Expected Discharge Plan: [place] ?In-house Referral: Clinical Social Work ?Discharge Planning Services: CM Consult ?   ?Living arrangements for the past 2 months: [place] ?                ?  ?  ?  ?  ?  ?  ?  ?  ?  ?  ? ? ?Social Determinants of Health (SDOH) Interventions ?  ? ?Readmission Risk Interventions ? ?  06/08/2021  ?  1:48 PM 05/05/2021  ? 10:01 AM  ?Readmission Risk Prevention Plan  ?Transportation Screening Complete Complete  ?Medication Review (RN Care Manager) Referral to Pharmacy Referral to Pharmacy  ?PCP or Specialist appointment within 3-5 days of discharge Complete Complete  ?[place] or Home Care Consult Patient refused Patient refused  ?SW Recovery Care/Counseling Consult Complete Complete  ?Palliative Care Screening Not Applicable Not Applicable  ?Skilled Nursing Facility Not Applicable Complete  ? ? ?

## 2021-06-11 NOTE — Progress Notes (Signed)
Physical Therapy Treatment ?Patient Details ?Name: Logan Nelson ?MRN: [id number] ?DOB: Mar 07, 1965 ?Today's Date: 06/11/2021 ? ? ?History of Present Illness Pt is a 10 admitted 4/3 with quickly progressing B LE weakness.  Recently hospitalize with incarcerated abdominal hernia, was repaired, but pt left AMA before he was safe to mobilize,  MRI of brain, cervical/thoracic spine inconclusive.  Work up continues.  PMHx: anemia, arthritis, dyspnea, GERD, HTN, LE edema ? ?  ?PT Comments  ? ? Pt is progressing well toward goals, but not yet safe to be independent in the community.  Emphasis on sit to stands/safety, squat pivot transfers (simulated for w/c transfers) and progression of gait stability/stamina and quality.  To go imminently into the community, pt will need a w/c with good cushion and will likely need to use this as a primary or equal mode of locomotion as the RW. ? ?  ?Recommendations for follow up therapy are one component of a multi-disciplinary discharge planning process, led by the attending physician.  Recommendations may be updated based on patient status, additional functional criteria and insurance authorization. ? ?Follow Up Recommendations ? Skilled nursing-short term rehab (<3 hours/day) ?  ?  ?Assistance Recommended at Discharge Frequent or constant Supervision/Assistance  ?Patient can return home with the following A little help with walking and/or transfers;A little help with bathing/dressing/bathroom;Assist for transportation;Help with stairs or ramp for entrance;Assistance with cooking/housework ?  ?Equipment Recommendations ? Rolling walker (2 wheels);Wheelchair (measurements PT);Wheelchair cushion (measurements PT);Other (comment) (Pt is neither yet safe to mobilize without min guard assist or capable of maintaining safety ambulating longer distances.  A w/c and cushion will be necessary as the safest mode of transport if pt is to discharge soon to the community/shelter/etc.)  ?   ?Recommendations for Other Services   ? ? ?  ?Precautions / Restrictions Precautions ?Precautions: Fall  ?  ? ?Mobility ? Bed Mobility ?Overal bed mobility: Needs Assistance ?Bed Mobility: Supine to Sit, Sit to Supine ?  ?  ?Supine to sit: Min assist ?Sit to supine: Mod assist ?  ?  ?  ? ?Transfers ?Overall transfer level: Needs assistance ?Equipment used: Rolling walker (2 wheels) ?Transfers: Sit to/from Stand, Bed to chair/wheelchair/BSC ?Sit to Stand: Min assist, Min guard (depending on height of surface and whether has armrests) ?  ?  ?Squat pivot transfers: Min assist (x2) ?  ?  ?General transfer comment: practice coming forward, staying in a squat, reaching for "far" armrest and pivoting without steps or releasing "near" hand x2.  cues and demo for technique.  Described as his way of transferring to/from his new w/c ?  ? ?Ambulation/Gait ?Ambulation/Gait assistance: Min guard ?Gait Distance (Feet): 110 Feet ?Assistive device: Rolling walker (2 wheels) ?Gait Pattern/deviations: Step-through pattern, Decreased stride length ?  ?Gait velocity interpretation: <1.8 ft/sec, indicate of risk for recurrent falls ?  ?General Gait Details: pt milldly unsteady overall when scanning, turning, conversing.  Slower gait, mild RW waggle, but no overt LOB.  Notable fatigue at over 100-150 feet. ? ? ?Stairs ?  ?  ?  ?  ?  ? ? ?Wheelchair Mobility ?  ? ?Modified Rankin (Stroke Patients Only) ?  ? ? ?  ?Balance Overall balance assessment: Needs assistance ?Sitting-balance support: Feet supported ?Sitting balance-Leahy Scale: Fair ?  ?  ?  ?Standing balance-Leahy Scale: Poor ?Standing balance comment: reliant on RW for support ?  ?  ?  ?  ?  ?  ?  ?  ?  ?  ?  ?  ? ?  ?  Cognition Arousal/Alertness: Awake/alert ?Behavior During Therapy: [facility] for tasks assessed/performed ?Overall Cognitive Status: No family/caregiver present to determine baseline cognitive functioning ?Area of Impairment: Safety/judgement, Awareness ?  ?  ?  ?  ?  ?   ?  ?  ?  ?  ?  ?  ?Safety/Judgement: Decreased awareness of safety ?Awareness: Intellectual ?  ?  ?  ?  ? ?  ?Exercises   ? ?  ?General Comments   ?  ?  ? ?Pertinent Vitals/Pain Pain Assessment ?Pain Assessment: Faces ?Faces Pain Scale: Hurts little more ?Pain Location: L scalenes/traps--buttock wound. ?Pain Descriptors / Indicators: Grimacing, Guarding ?Pain Intervention(s): Monitored during session, Other (comment) (deep massage to neck musculature)  ? ? ?Home Living   ?  ?  ?  ?  ?  ?  ?  ?  ?  ?   ?  ?Prior Function    ?  ?  ?   ? ?PT Goals (current goals can now be found in the care plan section) Acute Rehab PT Goals ?Patient Stated Goal: return to independence ?PT Goal Formulation: With patient ?Time For Goal Achievement: 06/23/21 ?Potential to Achieve Goals: Good ?Progress towards PT goals: Progressing toward goals ? ?  ?Frequency ? ? ? Min 3X/week ? ? ? ?  ?PT Plan Current plan remains appropriate  ? ? ?Co-evaluation   ?  ?  ?  ?  ? ?  ?AM-PAC PT "6 Clicks" Mobility   ?Outcome Measure ? Help needed turning from your back to your side while in a flat bed without using bedrails?: A Little ?Help needed moving from lying on your back to sitting on the side of a flat bed without using bedrails?: A Lot ?Help needed moving to and from a bed to a chair (including a wheelchair)?: A Lot ?Help needed standing up from a chair using your arms (e.g., wheelchair or bedside chair)?: A Little ?Help needed to walk in hospital room?: A Little ?Help needed climbing 3-5 steps with a railing? : A Lot ?6 Click Score: 15 ? ?  ?End of Session   ?Activity Tolerance: Patient tolerated treatment well ?Patient left: in bed;with call bell/phone within reach ?  ?PT Visit Diagnosis: Other abnormalities of gait and mobility (R26.89);Muscle weakness (generalized) (M62.81) ?Pain - Right/Left: Left ?Pain - part of body:  (neck  scalenes and traps) ?  ? ? ?Time: 8413-2440 ?PT Time Calculation (min) (ACUTE ONLY): 26 min ? ?Charges:  $Gait  Training: 8-22 mins ?$Therapeutic Activity: 8-22 mins          ?          ? ?06/11/2021 ? ?Jacinto Halim., PT ?Acute Rehabilitation Services ?[phone]  (pager) ?[phone]  (office) ? ? ?Eliseo Gum Tekelia Kareem ?06/11/2021, 6:25 PM ? ?

## 2021-06-11 NOTE — Plan of Care (Signed)

## 2021-06-11 NOTE — Progress Notes (Signed)
Upper extremity venous has been completed.  ? ?Preliminary results in CV Proc.  ? ?Logan Nelson Logan Nelson ?06/11/2021 8:55 AM    ?

## 2021-06-11 NOTE — Plan of Care (Signed)
Pt is alert oriented x 4. RA Pt has stood up with +1 assist, pt used bedside commode. Pt had large brown mushy, bowel movement. Pt c/o generalized pain, prn pain medication given.  ? ? ?Problem: Education: ?Goal: Knowledge of General Education information will improve ?Description: Including pain rating scale, medication(s)/side effects and non-pharmacologic comfort measures ?Outcome: Progressing ?  ?Problem: Health Behavior/Discharge Planning: ?Goal: Ability to manage health-related needs will improve ?Outcome: Progressing ?  ?Problem: Clinical Measurements: ?Goal: Ability to maintain clinical measurements within normal limits will improve ?Outcome: Progressing ?Goal: Will remain free from infection ?Outcome: Progressing ?Goal: Diagnostic test results will improve ?Outcome: Progressing ?Goal: Respiratory complications will improve ?Outcome: Progressing ?Goal: Cardiovascular complication will be avoided ?Outcome: Progressing ?  ?Problem: Activity: ?Goal: Risk for activity intolerance will decrease ?Outcome: Progressing ?  ?Problem: Nutrition: ?Goal: Adequate nutrition will be maintained ?Outcome: Progressing ?  ?Problem: Coping: ?Goal: Level of anxiety will decrease ?Outcome: Progressing ?  ?Problem: Elimination: ?Goal: Will not experience complications related to bowel motility ?Outcome: Progressing ?Goal: Will not experience complications related to urinary retention ?Outcome: Progressing ?  ?Problem: Pain Managment: ?Goal: General experience of comfort will improve ?Outcome: Progressing ?  ?Problem: Safety: ?Goal: Ability to remain free from injury will improve ?Outcome: Progressing ?  ?Problem: Skin Integrity: ?Goal: Risk for impaired skin integrity will decrease ?Outcome: Progressing ?  ?

## 2021-06-12 LAB — BASIC METABOLIC PANEL
Anion gap: 9 (ref 5–15)
BUN: 17 mg/dL (ref 6–20)
CO2: 26 mmol/L (ref 22–32)
Calcium: 8.7 mg/dL — ABNORMAL LOW (ref 8.9–10.3)
Chloride: 101 mmol/L (ref 98–111)
Creatinine, Ser: 0.5 mg/dL — ABNORMAL LOW (ref 0.61–1.24)
GFR, Estimated: >60 mL/min (ref >60)
Glucose, Bld: 106 mg/dL — ABNORMAL HIGH (ref 70–99)
Potassium: 3.9 mmol/L (ref 3.5–5.1)
Sodium: 136 mmol/L (ref 135–145)

## 2021-06-12 LAB — MAGNESIUM: Magnesium: 1.4 mg/dL — ABNORMAL LOW (ref 1.7–2.4)

## 2021-06-12 MED ORDER — BACLOFEN 10 MG PO TABS
10.0000 mg | ORAL_TABLET | Freq: Three times a day (TID) | ORAL | Status: DC
  Administered 2021-06-12 – 2021-06-16 (×11): 10 mg via ORAL
  Filled 2021-06-12 (×11): qty 1

## 2021-06-12 MED ORDER — MAGNESIUM SULFATE 4 GM/100ML IV SOLN
4.0000 g | Freq: Once | INTRAVENOUS | Status: AC
  Administered 2021-06-12: 4 g via INTRAVENOUS
  Filled 2021-06-12: qty 100

## 2021-06-12 MED ORDER — FUROSEMIDE 10 MG/ML IJ SOLN
80.0000 mg | Freq: Two times a day (BID) | INTRAMUSCULAR | Status: DC
  Administered 2021-06-12: 80 mg via INTRAVENOUS
  Filled 2021-06-12: qty 8

## 2021-06-12 MED ORDER — METOPROLOL TARTRATE 5 MG/5ML IV SOLN
2.5000 mg | INTRAVENOUS | Status: DC | PRN
  Administered 2021-06-12 – 2021-06-15 (×6): 2.5 mg via INTRAVENOUS
  Filled 2021-06-12 (×6): qty 5

## 2021-06-12 MED ORDER — BACLOFEN 10 MG PO TABS
5.0000 mg | ORAL_TABLET | Freq: Three times a day (TID) | ORAL | Status: DC
  Administered 2021-06-12 (×2): 5 mg via ORAL
  Filled 2021-06-12 (×2): qty 1

## 2021-06-12 MED ORDER — FUROSEMIDE 10 MG/ML IJ SOLN
40.0000 mg | Freq: Two times a day (BID) | INTRAMUSCULAR | Status: DC
  Administered 2021-06-13 – 2021-06-14 (×3): 40 mg via INTRAVENOUS
  Filled 2021-06-12 (×3): qty 4

## 2021-06-12 MED ORDER — KETOROLAC TROMETHAMINE 15 MG/ML IJ SOLN
15.0000 mg | Freq: Four times a day (QID) | INTRAMUSCULAR | Status: DC
  Administered 2021-06-12 – 2021-06-13 (×5): 15 mg via INTRAVENOUS
  Filled 2021-06-12 (×5): qty 1

## 2021-06-12 NOTE — Plan of Care (Signed)
Pt is oriented x 4. Pt c/o of generalized pain and neck pain. Prn pain medication not fully effective. On call provider called to inform. Provider ordered x 1 of toradol. Pt stated this helped and pt slept for 2 hours. This morning pt c/o neck pain again. PRN pain medication given. Pt is up eating breakfast. No distress noted. Pt does require assistance with getting out and in out of bed. Pt also needs help with wiping after bowel movement. No distress noted. Call button within reach.  ? ? ? ?Problem: Health Behavior/Discharge Planning: ?Goal: Ability to manage health-related needs will improve ?Outcome: Progressing ?  ?Problem: Clinical Measurements: ?Goal: Ability to maintain clinical measurements within normal limits will improve ?Outcome: Progressing ?Goal: Will remain free from infection ?Outcome: Progressing ?Goal: Diagnostic test results will improve ?Outcome: Progressing ?Goal: Respiratory complications will improve ?Outcome: Progressing ?Goal: Cardiovascular complication will be avoided ?Outcome: Progressing ?  ?Problem: Activity: ?Goal: Risk for activity intolerance will decrease ?Outcome: Progressing ?  ?Problem: Nutrition: ?Goal: Adequate nutrition will be maintained ?Outcome: Progressing ?  ?Problem: Elimination: ?Goal: Will not experience complications related to bowel motility ?Outcome: Progressing ?Goal: Will not experience complications related to urinary retention ?Outcome: Progressing ?  ?Problem: Pain Managment: ?Goal: General experience of comfort will improve ?Outcome: Progressing ?  ?Problem: Safety: ?Goal: Ability to remain free from injury will improve ?Outcome: Progressing ?  ?Problem: Skin Integrity: ?Goal: Risk for impaired skin integrity will decrease ?Outcome: Progressing ?  ?

## 2021-06-12 NOTE — Progress Notes (Signed)
?Progress Note ?Patient: Logan Nelson GXQ:[id number] DOB: 1965/03/04 DOA: 05/10/2021  ?DOS: the patient was seen and examined on 06/12/2021 ? ?Brief hospital course: ?Byren Damareon Lanni is a 56 y.o. male with past medical history of severe protein calorie malnutrition, hypertension, anasarca, abdominal wall hernia presented to the hospital with multiple falls and swelling. ?Found to have failure to thrive with protein calorie malnutrition currently improving. ?Assessment and Plan: ?Right-sided weakness. ?Deconditioning. ?Presents with multiple falls.  Weakness appears to be more on the right upper extremity. ?Neurology was consulted.  Unable to identify and pinpoint an etiology based on the work-up recommend outpatient follow-up and treatment of malnutrition. ?MRIs were done, C-spine was concerning for enhancement for infection, neurosurgery was consulted and they felt that there is no evidence of infection and no further work-up was recommended. ?B1 normal, B12 low normal, vitamin D low, vitamin A low.  All currently being replaced. ?ESR normal.  CRP mildly elevated but improving.  CK normal.  TSH normal.  Phosphorus level was low but now replaced. ?PT OT consulted recommended SNF although patient is able to ambulate once he stands up. ?Currently looking for placement at a shelter once volume overload is treated. ?Brachial plexus issue cannot be ruled out. ?Outpatient EMG recommended for right upper extremity. ?  ?Adult failure to thrive. ?Hypoalbuminemia ?Severe protein calorie malnutrition. ?Body mass index is 24.89 kg/m?Marland Kitchen ?Nutrition Problem: Severe Malnutrition ?Etiology: chronic illness ?Nutrition Interventions: ?Interventions: MVI, Boost Breeze, Refer to RD note for recommendations, Other (Comment), Magic cup (vitamin D, double protein) Present on admission. ?Dietitian consulted. ?CT abdomen and pelvis without any acute abnormality but distended stomach. ?We will switch to scheduled simethicone and repeat  x-ray abdomen tomorrow. ?Patient did have some diarrhea intermittently, unsure of malabsorption syndrome/inflammatory bowel condition. ?[facility] consulted.  Recommend colonoscopy and EGD once he is ambulatory with improvement in nutritional status. ?Outpatient follow-up with GI recommended. ? ?Neck pain.  Spasticity ?We will restart Toradol. ?Also add baclofen. ?  ?Anasarca. ?Hypoalbuminemia with third spacing. ?Currently receiving IV Lasix.  Increase the dose from 40 mg twice daily to 80 mg twice daily. ?  ?Recently repair of incarcerated ventral incisional hernia. ?CT abdomen unremarkable for any acute abnormality.  Monitor for now. ?  ?HTN. ?Blood pressure stable. ?Continue current regimen. ?  ?Iron deficiency anemia likely from nutritional deficiency. ?Monitor H&H as needed. ?Transfer hemoglobin less than 7. ?So far 3 PRBC transfusion. ?GI was consulted.  No active bleeding seen. ?We will treat with IV iron, subcutaneous B12 injection and B complex. ?  ?Hypokalemia, hypocalcemia, hypophosphatemia, hypomagnesemia. ?Currently being replaced. ?Monitor. ?  ?Pressure ulcer stage II, present on admission at left buttock. ?Continue wound care. ?Currently receiving Dakin's ? ?Subjective: Continues to report left-sided neck pain.  No nausea no vomiting.  No fever no chills. ? ?Physical Exam: ?Vitals:  ? 06/12/21 0741 06/12/21 1225 06/12/21 1536 06/12/21 1946  ?BP: (!) 138/92 (!) 149/103 (!) 139/102 (!) 141/107  ?Pulse: 100 (!) 101 (!) 107 (!) 123  ?Resp: 16 16 16 20   ?Temp: 98.4 ?F (36.9 ?C) 98.3 ?F (36.8 ?C) 99.1 ?F (37.3 ?C) 99 ?F (37.2 ?C)  ?TempSrc: Oral Oral Oral Oral  ?SpO2: 98% 100% 99% 98%  ?Weight:      ?Height:      ? ?General: Appear in mild distress; no visible Abnormal Neck Mass Or lumps, Conjunctiva normal ?Cardiovascular: S1 and S2 Present, no Murmur, ?Respiratory: good respiratory effort, Bilateral Air entry present and CTA, no Crackles, no wheezes ?Abdomen: Bowel  Sound present, Non tender   ?Extremities: no Pedal edema ?Neurology: alert and oriented to time, place, and person ?Gait not checked due to patient safety concerns ? ?Data Reviewed: ?I have Reviewed nursing notes, Vitals, and Lab results since pt's last encounter. Pertinent lab results BMP and magnesium ?I have ordered test including BMP and magnesium   ? ?Family Communication: None at bedside ? ?Disposition: ?Status is: Inpatient ?Remains inpatient appropriate because: Continues to require IV diuresis. ? ?Author: ?Berle Mull, MD ?06/12/2021 8:45 PM ? ?Please look on [email or web address] to find out who is on call. ?

## 2021-06-13 DIAGNOSIS — I5031 Acute diastolic (congestive) heart failure: Secondary | ICD-10-CM | POA: Diagnosis present

## 2021-06-13 LAB — COMPREHENSIVE METABOLIC PANEL
ALT: 22 U/L (ref 0–44)
AST: 21 U/L (ref 15–41)
Albumin: 3 g/dL — ABNORMAL LOW (ref 3.5–5.0)
Alkaline Phosphatase: 94 U/L (ref 38–126)
Anion gap: 7 (ref 5–15)
BUN: 23 mg/dL — ABNORMAL HIGH (ref 6–20)
CO2: 29 mmol/L (ref 22–32)
Calcium: 8.8 mg/dL — ABNORMAL LOW (ref 8.9–10.3)
Chloride: 102 mmol/L (ref 98–111)
Creatinine, Ser: 0.57 mg/dL — ABNORMAL LOW (ref 0.61–1.24)
GFR, Estimated: >60 mL/min (ref >60)
Glucose, Bld: 116 mg/dL — ABNORMAL HIGH (ref 70–99)
Potassium: 4 mmol/L (ref 3.5–5.1)
Sodium: 138 mmol/L (ref 135–145)
Total Bilirubin: 0.6 mg/dL (ref 0.3–1.2)
Total Protein: 7.1 g/dL (ref 6.5–8.1)

## 2021-06-13 LAB — CBC WITH DIFFERENTIAL/PLATELET
Abs Immature Granulocytes: 0.07 10*3/uL (ref 0.00–0.07)
Basophils Absolute: 0.1 10*3/uL (ref 0.0–0.1)
Basophils Relative: 1 %
Eosinophils Absolute: 0.1 10*3/uL (ref 0.0–0.5)
Eosinophils Relative: 1 %
HCT: 32 % — ABNORMAL LOW (ref 39.0–52.0)
Hemoglobin: 10.2 g/dL — ABNORMAL LOW (ref 13.0–17.0)
Immature Granulocytes: 1 %
Lymphocytes Relative: 6 %
Lymphs Abs: 0.9 10*3/uL (ref 0.7–4.0)
MCH: 29.6 pg (ref 26.0–34.0)
MCHC: 31.9 g/dL (ref 30.0–36.0)
MCV: 92.8 fL (ref 80.0–100.0)
Monocytes Absolute: 1.2 10*3/uL — ABNORMAL HIGH (ref 0.1–1.0)
Monocytes Relative: 8 %
Neutro Abs: 12.8 10*3/uL — ABNORMAL HIGH (ref 1.7–7.7)
Neutrophils Relative %: 83 %
Platelets: 413 10*3/uL — ABNORMAL HIGH (ref 150–400)
RBC: 3.45 MIL/uL — ABNORMAL LOW (ref 4.22–5.81)
RDW: 15.9 % — ABNORMAL HIGH (ref 11.5–15.5)
WBC: 15.2 10*3/uL — ABNORMAL HIGH (ref 4.0–10.5)
nRBC: 0 % (ref 0.0–0.2)

## 2021-06-13 LAB — MAGNESIUM: Magnesium: 1.8 mg/dL (ref 1.7–2.4)

## 2021-06-13 MED ORDER — OXYCODONE-ACETAMINOPHEN 5-325 MG PO TABS
2.0000 | ORAL_TABLET | ORAL | Status: DC | PRN
  Administered 2021-06-13 – 2021-06-15 (×4): 2 via ORAL
  Filled 2021-06-13 (×4): qty 2

## 2021-06-13 MED ORDER — ALBUMIN HUMAN 25 % IV SOLN
12.5000 g | Freq: Once | INTRAVENOUS | Status: DC
  Filled 2021-06-13: qty 50

## 2021-06-13 MED ORDER — LIDOCAINE 5 % EX PTCH
2.0000 | MEDICATED_PATCH | CUTANEOUS | Status: DC
  Administered 2021-06-13 – 2021-06-15 (×3): 2 via TRANSDERMAL
  Filled 2021-06-13 (×3): qty 2

## 2021-06-13 MED ORDER — ALBUMIN HUMAN 25 % IV SOLN
12.5000 g | Freq: Once | INTRAVENOUS | Status: AC
  Administered 2021-06-14: 12.5 g via INTRAVENOUS
  Filled 2021-06-13: qty 50

## 2021-06-13 MED ORDER — METHOCARBAMOL 1000 MG/10ML IJ SOLN
500.0000 mg | Freq: Three times a day (TID) | INTRAVENOUS | Status: DC | PRN
  Filled 2021-06-13: qty 5

## 2021-06-13 MED ORDER — KETOROLAC TROMETHAMINE 30 MG/ML IJ SOLN
30.0000 mg | Freq: Three times a day (TID) | INTRAMUSCULAR | Status: DC
  Administered 2021-06-13 – 2021-06-16 (×8): 30 mg via INTRAVENOUS
  Filled 2021-06-13 (×8): qty 1

## 2021-06-13 NOTE — Plan of Care (Signed)
Pt is alert oriented x 4. Pt c/o neck and back pain. PRN percocet given per order. Pt also has scheduled baclofen. Pts dressing to sacrum has been changed. Pt has been turning, repositioning, and sitting up at bedside through the night. Pt did require assistance in getting in bed and repositioning to side.  ? ? ?Problem: Education: ?Goal: Knowledge of General Education information will improve ?Description: Including pain rating scale, medication(s)/side effects and non-pharmacologic comfort measures ?Outcome: Progressing ?  ?Problem: Health Behavior/Discharge Planning: ?Goal: Ability to manage health-related needs will improve ?Outcome: Progressing ?  ?Problem: Clinical Measurements: ?Goal: Ability to maintain clinical measurements within normal limits will improve ?Outcome: Progressing ?Goal: Will remain free from infection ?Outcome: Progressing ?Goal: Diagnostic test results will improve ?Outcome: Progressing ?Goal: Respiratory complications will improve ?Outcome: Progressing ?Goal: Cardiovascular complication will be avoided ?Outcome: Progressing ?  ?Problem: Activity: ?Goal: Risk for activity intolerance will decrease ?Outcome: Progressing ?  ?Problem: Nutrition: ?Goal: Adequate nutrition will be maintained ?Outcome: Progressing ?  ?Problem: Coping: ?Goal: Level of anxiety will decrease ?Outcome: Progressing ?  ?Problem: Elimination: ?Goal: Will not experience complications related to bowel motility ?Outcome: Progressing ?Goal: Will not experience complications related to urinary retention ?Outcome: Progressing ?  ?Problem: Pain Managment: ?Goal: General experience of comfort will improve ?Outcome: Progressing ?  ?Problem: Safety: ?Goal: Ability to remain free from injury will improve ?Outcome: Progressing ?  ?Problem: Skin Integrity: ?Goal: Risk for impaired skin integrity will decrease ?Outcome: Progressing ?  ?

## 2021-06-13 NOTE — Progress Notes (Signed)
?Progress Note ?Patient: Logan Nelson:[id number] DOB: 07-30-1965 DOA: 05/10/2021  ?DOS: the patient was seen and examined on 06/13/2021 ? ?Brief hospital course: ?Logan Nelson is a 56 y.o. male with past medical history of severe protein calorie malnutrition, hypertension, anasarca, abdominal wall hernia presented to the hospital with multiple falls and swelling. ?Found to have failure to thrive with protein calorie malnutrition currently improving. ?Continue to monitor while receiving aggressive IV diuresis. ? ?Assessment and Plan: ? ?Hypoalbuminemia causing edema. ?Severe mitral regurgitation. ?Acute HFpEF ?Patient was on aggressive diuresis here in the hospital. ?Echocardiogram 3/23 shows EF of 60-65% with moderate to severe MR due to flail leaflet.  ?Patient is -24 L here in the hospital so far as of 5/7. ?Serum creatinine remaining stable despite aggressive diuresis but now appears to have rising BUN. ?Will monitor. ?Supporting Lasix with IV albumin.  ? ?Adult failure to thrive. ?Severe protein calorie malnutrition. ?Body mass index is 24.89 kg/m?Marland Kitchen ?Nutrition Problem: Severe Malnutrition ?Etiology: chronic illness ?Nutrition Interventions: ?Interventions: MVI, Boost Breeze, Refer to RD note for recommendations, Other (Comment), Magic cup (vitamin D, double protein) Present on admission. ?Dietitian consulted. ?CT abdomen and pelvis without any acute abnormality but distended stomach. ?Patient did have some diarrhea intermittently, unsure of malabsorption syndrome/inflammatory bowel condition. ?[facility] consulted.  Recommend colonoscopy and EGD once he is ambulatory with improvement in nutritional status. ?Outpatient follow-up with GI recommended. ? ?Right-sided weakness. ?Deconditioning. ?Presents with multiple falls.  Weakness appears to be more on the right upper extremity. ?Neurology was consulted.  Unable to identify and pinpoint an etiology based on the work-up.  Recommended outpatient  follow-up and treatment of malnutrition. ?MRIs were done, C-spine was concerning for enhancement for infection, neurosurgery was consulted and they felt that there is no evidence of infection and no further work-up was recommended. ?B1 normal, B12 low normal, vitamin D low, vitamin A low.  All currently being replaced. ?ESR normal.  CRP mildly elevated but improving.  CK normal.  TSH normal.  Phosphorus level was low but now replaced. ?PT OT consulted recommended SNF although patient is able to ambulate once he stands up. ?Currently looking for placement at a shelter once volume overload is treated. ?Brachial plexus issue cannot be ruled out. ?Outpatient EMG recommended for right upper extremity. ? ?Neck pain.  Spasticity ?We will restart Toradol.  Dose increased from 15 mg to 30 mg.  Also baclofen dose increased from 5 mg to 10 mg.  Also Percocet dose increased from 1 to 2 tablets to 2 tablets.  Lidocaine patch at the local site.  Also use heating pad as needed. ? ?Recently repair of incarcerated ventral incisional hernia. ?CT abdomen unremarkable for any acute abnormality.  Monitor for now. ?  ?HTN. ?Blood pressure stable. ?Continue current regimen. ? ?Sinus tachycardia/SVT ?EKG shows sinus tachycardia. ?Intermittently appears to have some SVTs as well. ?Likely in the setting of electrolyte imbalance while receiving IV diuresis. ?Lopressor added.  Monitor. ?  ?Iron deficiency anemia likely from nutritional deficiency. ?Monitor H&H as needed. ?Transfer hemoglobin less than 7. ?So far 3 PRBC transfusion. ?GI was consulted.  No active bleeding seen. ?Treated with IV iron, subcutaneous B12 injection ?Continue oral iron supplements and B complex. ?  ?Hypokalemia, hypocalcemia, hypophosphatemia, hypomagnesemia. ?Currently being replaced. ?Monitor. ?  ?Pressure ulcer stage II, present on admission at left buttock. ?Continue wound care. ?Currently receiving Dakin's ? ?Subjective: No nausea no vomiting no fever no chills.   No chest pain abdominal pain.  Continues to have  severe neck pain. ? ?Physical Exam: ?Vitals:  ? 06/13/21 0526 06/13/21 0835 06/13/21 1255 06/13/21 1600  ?BP: (!) 141/98 (!) 138/93 (!) 149/110 (!) 125/93  ?Pulse: (!) 109 (!) 111 99 (!) 109  ?Resp:  _0 ?Temp: 98.1 ?F (36.7 ?C) 99.4 ?F (37.4 ?C) 98.4 ?F (36.9 ?C) 99.3 ?F (37.4 ?C)  ?TempSrc:  Oral Oral Oral  ?SpO2: 100% 99% 99% 99%  ?Weight:      ?Height:      ? ?General: Appear in moderate distress; no visible Abnormal Neck Mass Or lumps, Conjunctiva normal ?Cardiovascular: S1 and S2 Present, no Murmur, ?Respiratory: good respiratory effort, Bilateral Air entry present and CTA, no Crackles, no wheezes ?Abdomen: Bowel Sound present, Non tender  ?Extremities: Diffuse right more than left pedal edema ?Neurology: alert and oriented to time, place, and person ?Gait not checked due to patient safety concerns  ? ?Data Reviewed: ?I have Reviewed nursing notes, Vitals, and Lab results since pt's last encounter. Pertinent lab results CBC and BMP ?I have ordered test including CBC and BMP   ? ?Family Communication: None at bedside ? ?Disposition: ?Status is: Inpatient ?Remains inpatient appropriate because: Still receiving IV diuresis. ? ?Author: ?Berle Mull, MD ?06/13/2021 6:01 PM ? ?Please look on [email or web address] to find out who is on call. ?

## 2021-06-14 ENCOUNTER — Inpatient Hospital Stay (HOSPITAL_COMMUNITY): Payer: Medicaid Other

## 2021-06-14 LAB — CBC WITH DIFFERENTIAL/PLATELET
Abs Immature Granulocytes: 0.07 10*3/uL (ref 0.00–0.07)
Basophils Absolute: 0.1 10*3/uL (ref 0.0–0.1)
Basophils Relative: 1 %
Eosinophils Absolute: 0 10*3/uL (ref 0.0–0.5)
Eosinophils Relative: 0 %
HCT: 29 % — ABNORMAL LOW (ref 39.0–52.0)
Hemoglobin: 9.2 g/dL — ABNORMAL LOW (ref 13.0–17.0)
Immature Granulocytes: 1 %
Lymphocytes Relative: 10 %
Lymphs Abs: 1.2 10*3/uL (ref 0.7–4.0)
MCH: 29.1 pg (ref 26.0–34.0)
MCHC: 31.7 g/dL (ref 30.0–36.0)
MCV: 91.8 fL (ref 80.0–100.0)
Monocytes Absolute: 1.2 10*3/uL — ABNORMAL HIGH (ref 0.1–1.0)
Monocytes Relative: 9 %
Neutro Abs: 10.5 10*3/uL — ABNORMAL HIGH (ref 1.7–7.7)
Neutrophils Relative %: 79 %
Platelets: 351 10*3/uL (ref 150–400)
RBC: 3.16 MIL/uL — ABNORMAL LOW (ref 4.22–5.81)
RDW: 16 % — ABNORMAL HIGH (ref 11.5–15.5)
WBC: 13.1 10*3/uL — ABNORMAL HIGH (ref 4.0–10.5)
nRBC: 0 % (ref 0.0–0.2)

## 2021-06-14 LAB — BASIC METABOLIC PANEL
Anion gap: 8 (ref 5–15)
BUN: 29 mg/dL — ABNORMAL HIGH (ref 6–20)
CO2: 29 mmol/L (ref 22–32)
Calcium: 8.9 mg/dL (ref 8.9–10.3)
Chloride: 103 mmol/L (ref 98–111)
Creatinine, Ser: 0.6 mg/dL — ABNORMAL LOW (ref 0.61–1.24)
GFR, Estimated: >60 mL/min (ref >60)
Glucose, Bld: 125 mg/dL — ABNORMAL HIGH (ref 70–99)
Potassium: 4 mmol/L (ref 3.5–5.1)
Sodium: 140 mmol/L (ref 135–145)

## 2021-06-14 LAB — PROCALCITONIN: Procalcitonin: 1.03 ng/mL

## 2021-06-14 LAB — MAGNESIUM: Magnesium: 1.9 mg/dL (ref 1.7–2.4)

## 2021-06-14 LAB — SEDIMENTATION RATE: Sed Rate: 30 mm/hr — ABNORMAL HIGH (ref 0–16)

## 2021-06-14 LAB — C-REACTIVE PROTEIN: CRP: 15.5 mg/dL — ABNORMAL HIGH (ref <1.0)

## 2021-06-14 IMAGING — DX DG CHEST 1V PORT
1 series · 1 of 1 positions shown · non-contrast
Comparison: Previous studies including the CT done on [DATE]

CLINICAL DATA: Sepsis

EXAM:
PORTABLE CHEST 1 VIEW

[chest]
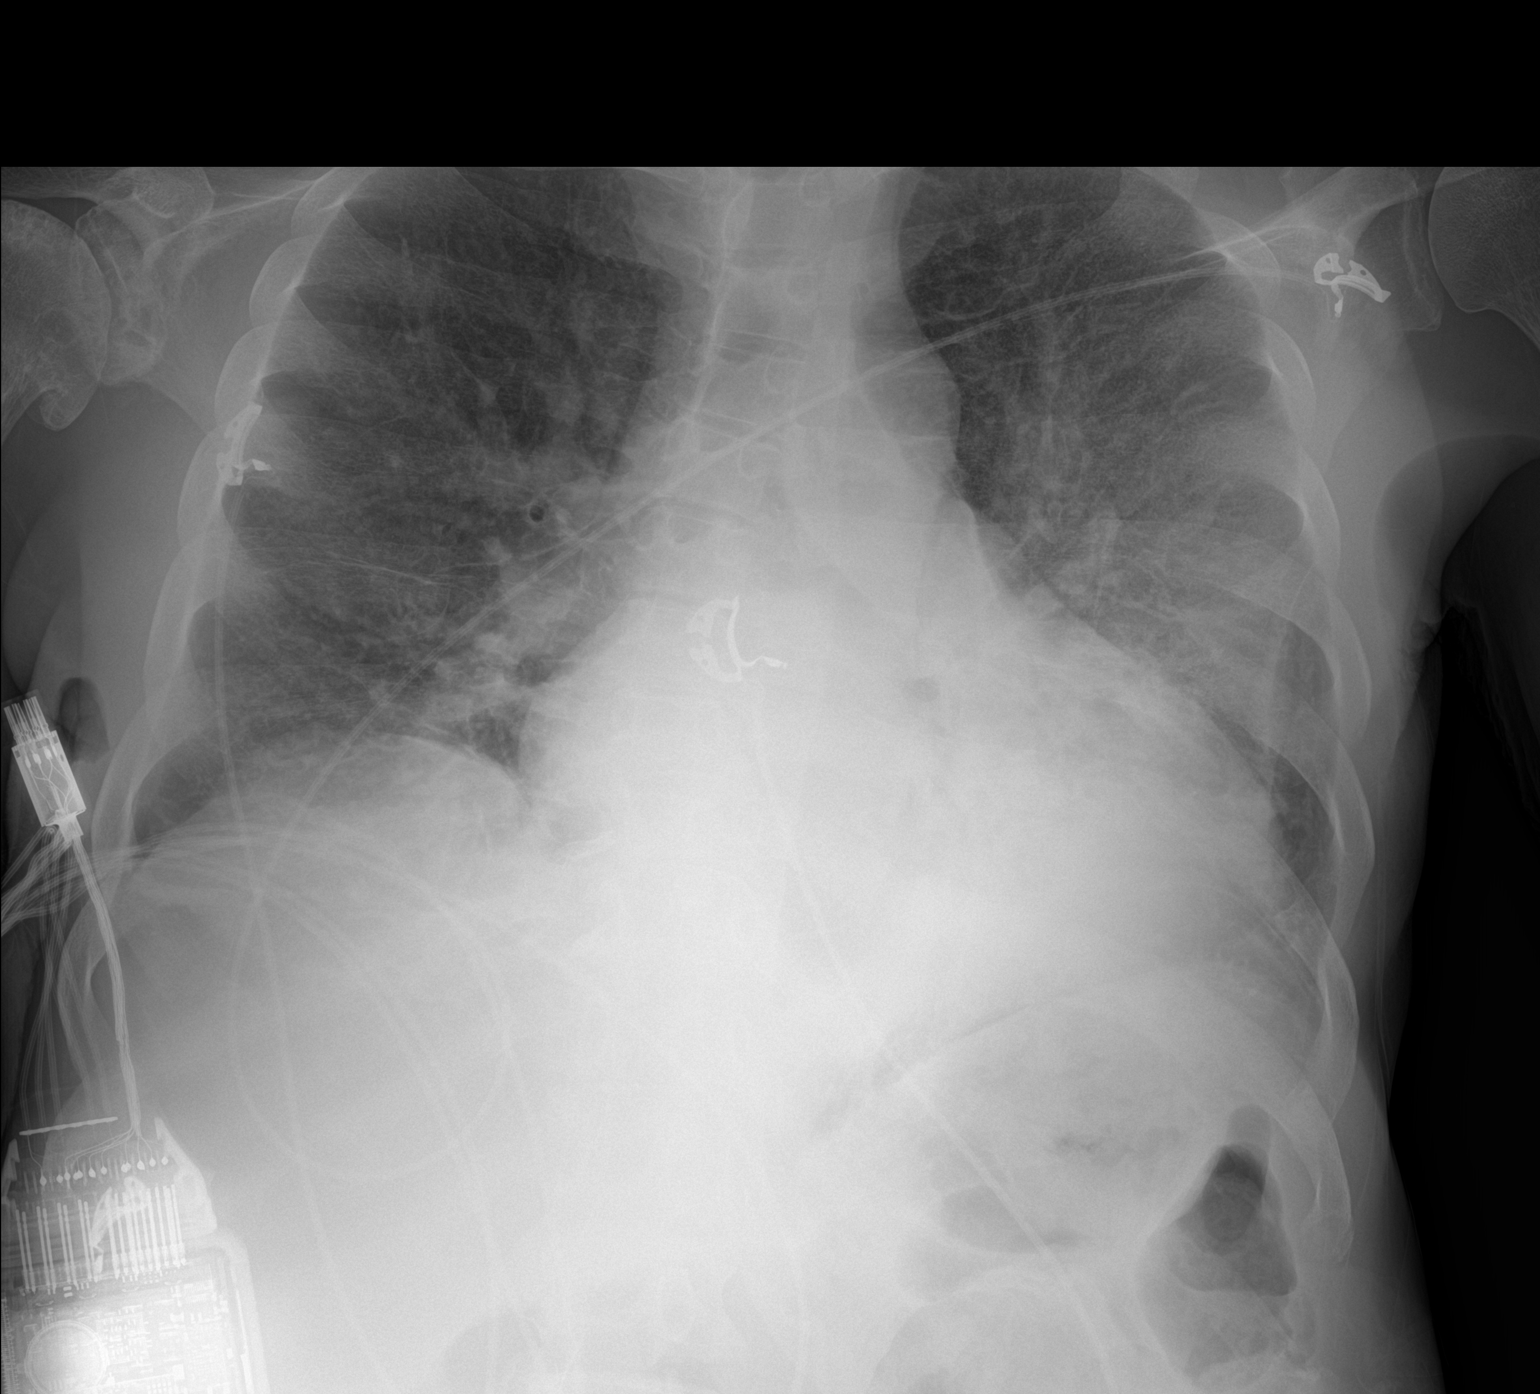

[1 of 1 positions shown; findings below may reference images not displayed]

FINDINGS: Transverse diameter of heart is increased. Central pulmonary vessels
are prominent. Increased interstitial markings are seen in the
parahilar regions and lower lung fields. There is homogeneous
opacity in the retrocardiac region in the left lower lung fields.
There is no pneumothorax. Degenerative changes are noted in the
right shoulder.
IMPRESSION: Cardiomegaly. Prominence of central pulmonary vessels and increased
interstitial markings in the parahilar regions suggest CHF. There is
homogeneous infiltrate in the left lower lung fields suggesting
atelectasis/pneumonia and possibly left pleural effusion.

## 2021-06-14 IMAGING — DX DG ABD PORTABLE 1V
1 series · 2 of 2 positions shown · non-contrast
Comparison: [DATE]

CLINICAL DATA: Sepsis

EXAM:
PORTABLE ABDOMEN - 1 VIEW

[Series 1: abdomen · 0.14mm/px · 2 of 2 slices shown]
[im 1/2]
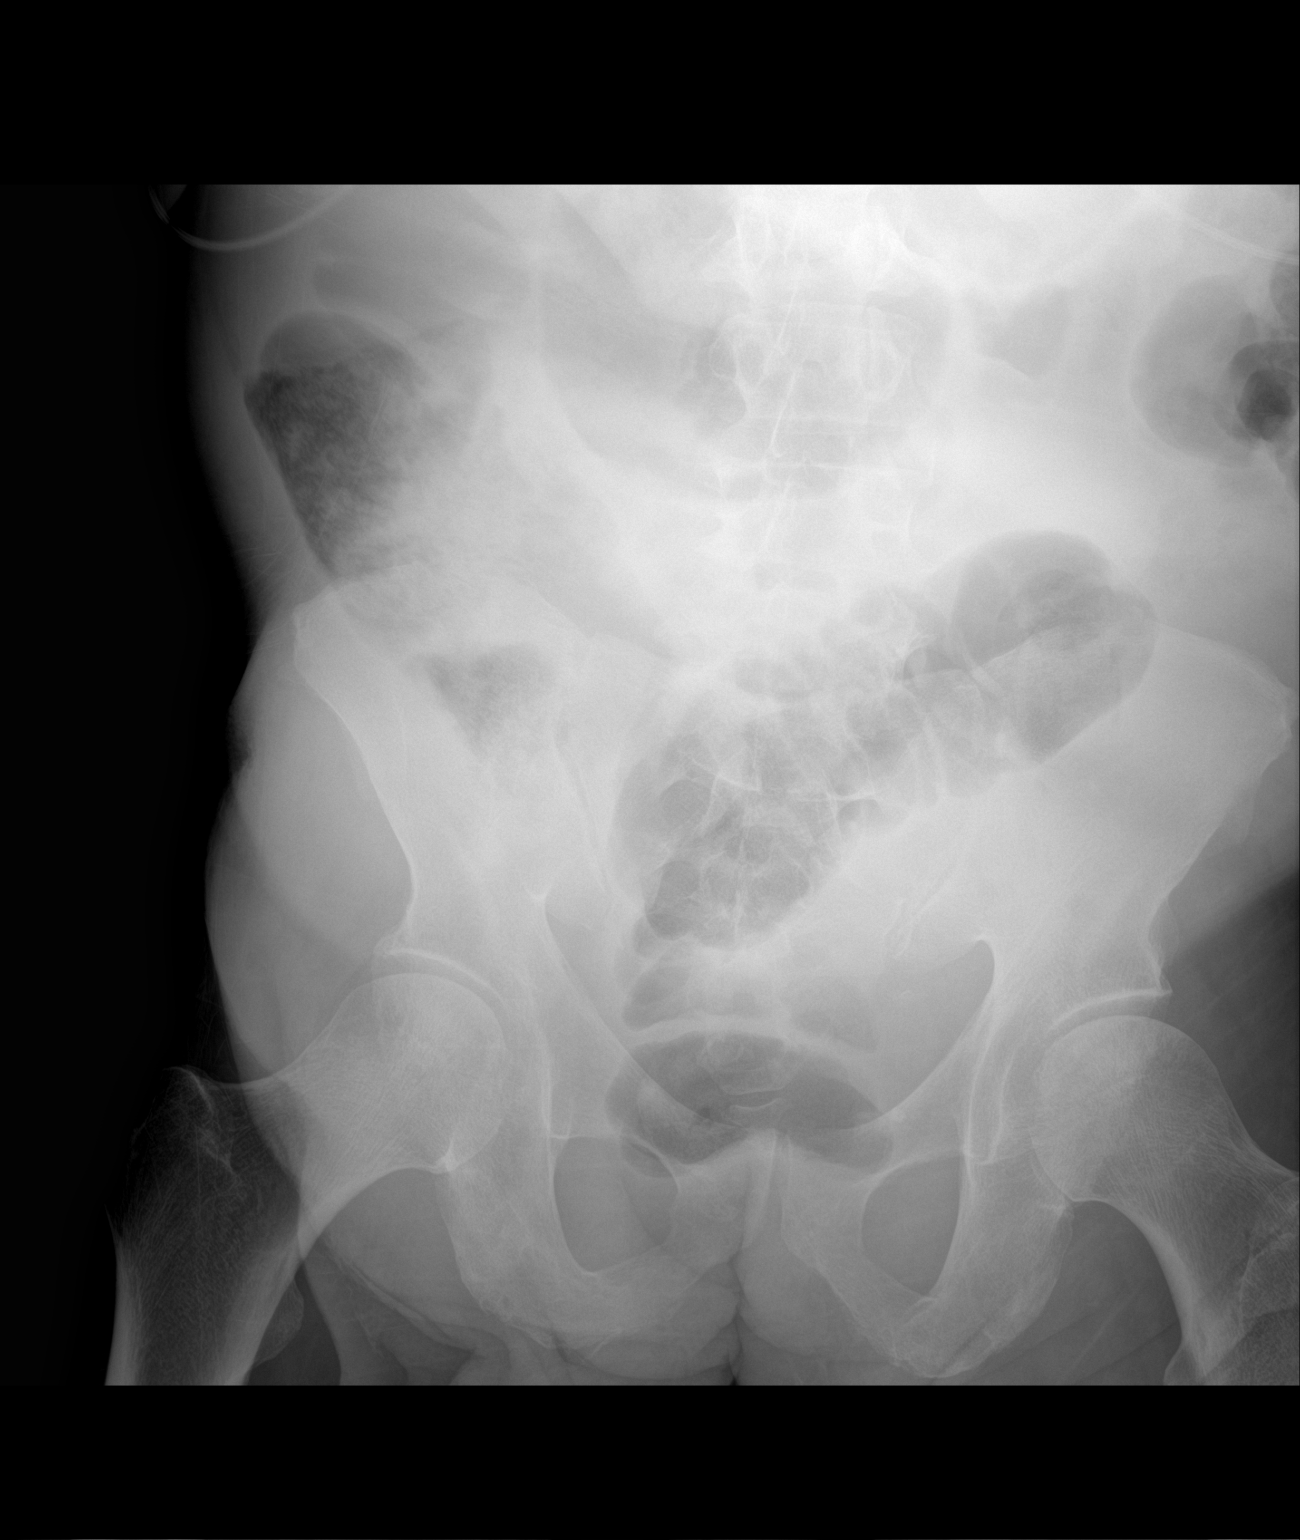
[im 2/2]
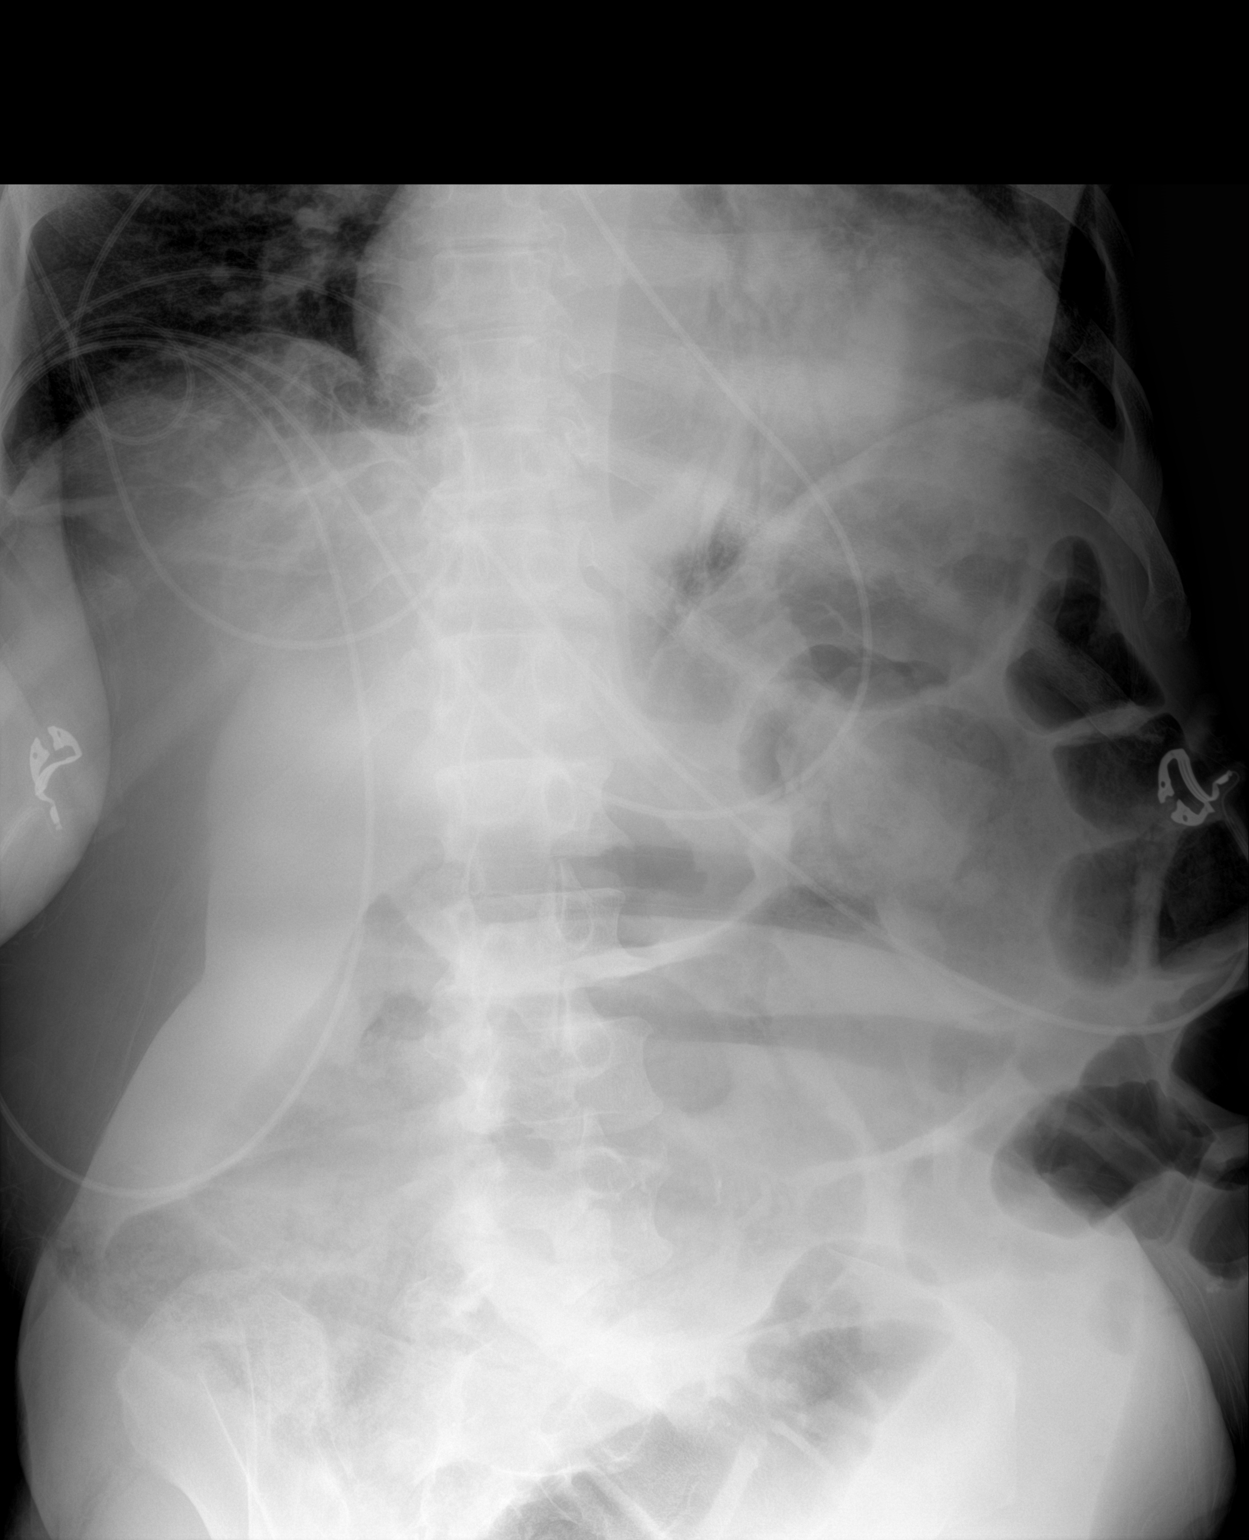

[2 of 2 positions shown; findings below may reference images not displayed]

FINDINGS: There is moderate gaseous distention of colon. There is no
significant small bowel dilation. There is dense infiltrate with air
bronchogram in the medial left lower lung fields suggesting
atelectasis/pneumonia.
IMPRESSION: There is moderate gaseous distention of colon suggesting ileus.

There is dense infiltrate with air bronchogram in the medial left
lower lung fields suggesting atelectasis/pneumonia in the left lower
lobe.

## 2021-06-14 MED ORDER — SODIUM CHLORIDE 0.9 % IV SOLN
2.0000 g | Freq: Three times a day (TID) | INTRAVENOUS | Status: DC
  Administered 2021-06-14 – 2021-06-15 (×2): 2 g via INTRAVENOUS
  Filled 2021-06-14 (×2): qty 12.5

## 2021-06-14 MED ORDER — METRONIDAZOLE 500 MG/100ML IV SOLN
500.0000 mg | Freq: Two times a day (BID) | INTRAVENOUS | Status: DC
  Administered 2021-06-15 (×2): 500 mg via INTRAVENOUS
  Filled 2021-06-14 (×2): qty 100

## 2021-06-14 MED ORDER — SIMETHICONE 80 MG PO CHEW
80.0000 mg | CHEWABLE_TABLET | Freq: Four times a day (QID) | ORAL | Status: DC
  Administered 2021-06-14 – 2021-06-15 (×5): 80 mg via ORAL
  Filled 2021-06-14 (×10): qty 1

## 2021-06-14 MED ORDER — MORPHINE SULFATE (PF) 2 MG/ML IV SOLN
2.0000 mg | INTRAVENOUS | Status: DC | PRN
  Administered 2021-06-15 – 2021-06-16 (×4): 2 mg via INTRAVENOUS
  Filled 2021-06-14 (×4): qty 1

## 2021-06-14 NOTE — Progress Notes (Signed)
?Progress Note ?Patient: Logan Nelson BWG:[id number] DOB: [birth year] DOA: 05/10/2021  ?DOS: the patient was seen and examined on 06/14/2021 ? ?Brief hospital course: ?Logan Nelson is a 56 y.o. male with past medical history of severe protein calorie malnutrition, hypertension, anasarca, abdominal wall hernia presented to the hospital with multiple falls and swelling. ?Found to have failure to thrive with protein calorie malnutrition currently improving. ?Had a fever with SIRS criteria on 5/8. ? ?Assessment and Plan: ?SIRS.  Possible sepsis.  Not present on admission. ?Develops fever, leukocytosis, tachycardia and tachypnea on 5/8. ?Chest x-ray shows possible left lower lobe atelectasis/infiltrate ?X-ray abdomen shows evidence of ileus. ?ESR elevated.  Procalcitonin elevated.  CRP elevated. ?Blood cultures performed. ?We will discontinue IV diuresis. ?Add IV cefepime and Flagyl. ?Check MRI pelvis as patient's sacral wound is still having purulent discharge with tunneling. ? ?Hypoalbuminemia causing edema. ?Severe mitral regurgitation. ?Acute HFpEF ?Patient was on aggressive diuresis here in the hospital. ?Echocardiogram 3/23 shows EF of 60-65% with moderate to severe MR due to flail leaflet.  ?Serum creatinine remaining stable despite aggressive diuresis but now appears to have rising BUN. ?Hold diuresis in the setting of of possible sepsis. ? ?Adult failure to thrive. ?Severe protein calorie malnutrition. ?Body mass index is 24.89 kg/m?Marland Kitchen ?Nutrition Problem: Severe Malnutrition ?Etiology: chronic illness ?Nutrition Interventions: ?Interventions: MVI, Boost Breeze, Refer to RD note for recommendations, Other (Comment), Magic cup (vitamin D, double protein) Present on admission. ?Dietitian consulted. ?CT abdomen and pelvis without any acute abnormality but distended stomach. ?Patient did have some diarrhea intermittently, unsure of malabsorption syndrome/inflammatory bowel condition. ? GI consulted.   Recommend colonoscopy and EGD once he is ambulatory with improvement in nutritional status. ?Outpatient follow-up with GI recommended. ?  ?Right-sided weakness. ?Deconditioning. ?Presents with multiple falls.  Weakness appears to be more on the right upper extremity. ?Neurology was consulted.  Unable to identify and pinpoint an etiology based on the work-up.  Recommended outpatient follow-up and treatment of malnutrition. ?MRIs were done, C-spine was concerning for enhancement for infection, neurosurgery was consulted and they felt that there is no evidence of infection and no further work-up was recommended. ?B1 normal, B12 low normal, vitamin D low, vitamin A low.  All currently being replaced. ?ESR normal.  CRP mildly elevated but improving.  CK normal.  TSH normal.  Phosphorus level was low but now replaced. ?PT OT consulted recommended SNF although patient is able to ambulate once he stands up. ?Currently looking for placement at a shelter once volume overload is treated. ?Brachial plexus issue cannot be ruled out. ?Outpatient EMG recommended for right upper extremity. ? ?Pressure ulcer stage II, present on admission at left buttock. ?Continue wound care. ?Currently receiving Dakin's ?Wound is tunneling per wound care's note and continues to have purulent greenish discharge. ?Patient also is not assisting with hygiene and dressing changes consistently. ?Check MRI pelvis.  Will request wound care to come back to see the patient pending results of the MRI. ? ?Possible ileus. ?Recently repair of incarcerated ventral incisional hernia. ?CT abdomen unremarkable for any acute abnormality.  Monitor for now. ?I do not think the patient is having ileus although gaseous distention and carvedilol.  Continue simethicone. ?  ?Neck pain.  Spasticity ?Continue Toradol. ?Dose increased from 15 mg to 30 mg.   ?Also baclofen dose increased from 5 mg to 10 mg.   ?Also Percocet dose increased from 1 to 2 tablets to 2 tablets.    ?Lidocaine patch at the local site.  Also  use heating pad as needed. ?Add as needed morphine. ? ?HTN. ?Blood pressure stable. ?Continue current regimen. ?  ?Sinus tachycardia/SVT ?EKG shows sinus tachycardia. ?Intermittently appears to have some SVTs as well. ?Likely in the setting of electrolyte imbalance while receiving IV diuresis. ?Lopressor added.  Monitor. ?  ?Iron deficiency anemia likely from nutritional deficiency. ?Monitor H&H as needed. ?Transfer hemoglobin less than 7. ?So far 3 PRBC transfusion. ?GI was consulted.  No active bleeding seen. ?Treated with IV iron, subcutaneous B12 injection ?Continue oral iron supplements and B complex. ?  ?Hypokalemia, hypocalcemia, hypophosphatemia, hypomagnesemia. ?Currently being replaced. ?Monitor. ? ?Subjective: No nausea or vomiting.  His neck pain was significantly better this morning.  Later in the afternoon started having fever.  Continues to have tachycardia and tachypnea throughout the day.  No chest pain abdominal pain.  No cough.  Denies any shortness of breath as well. ? ?Physical Exam: ?Vitals:  ? 06/14/21 1500 06/14/21 1600 06/14/21 1700 06/14/21 1800  ?BP: 112/85 128/90 122/88 (!) 135/95  ?Pulse: (!) 127 (!) 121 (!) 128 (!) 133  ?Resp: (!) _0 ?Temp: (!) 101.5 ?F (38.6 ?C) (!) 102.9 ?F (39.4 ?C) (!) 102.3 ?F (39.1 ?C) 99.8 ?F (37.7 ?C)  ?TempSrc: Oral Oral Oral Oral  ?SpO2: 95% 96% 94% 91%  ?Weight:      ?Height:      ? ?General: Appear in moderate distress; no visible Abnormal Neck Mass Or lumps, Conjunctiva normal ?Cardiovascular: S1 and S2 Present, no Murmur, ?Respiratory: increased respiratory effort, Bilateral Air entry present and CTA, no Crackles, no wheezes ?Abdomen: Bowel Sound present, Non tender, distended ?Extremities: Right upper extremity edema, bilateral pedal edema ?Neurology: alert and oriented to time, place, and person ?Gait not checked due to patient safety concerns  ? ?Data Reviewed: ?I have Reviewed nursing notes, Vitals,  and Lab results since pt's last encounter. Pertinent lab results CBC and BMP ?I have ordered test including CBC, BMP, procalcitonin, CRP, ESR ?I have ordered imaging studies MRI pelvis, x-ray chest, x-ray abdomen.  ? ?Family Communication: None at bedside ? ?Disposition: ?Status is: Inpatient ?Remains inpatient appropriate because: Treatment for sepsis now ? ?Author: ?Berle Mull, MD ?06/14/2021 7:25 PM ? ?Please look on [email or web address] to find out who is on call. ?

## 2021-06-14 NOTE — Progress Notes (Signed)
?   06/14/21 1500  ?Vitals  ?Temp (!) 101.5 ?F (38.6 ?C)  ?Temp Source Oral  ?BP 112/85  ?MAP (mmHg) 93  ?Pulse Rate (!) 127  ?Pulse Rate Source Dinamap  ?Resp (!) 21  ?MEWS COLOR  ?MEWS Score Color Red  ?Oxygen Therapy  ?SpO2 95 %  ?MEWS Score  ?MEWS Temp 2  ?MEWS Systolic 0  ?MEWS Pulse 2  ?MEWS RR 1  ?MEWS LOC 0  ?MEWS Score 5  ? ? ? ?Dr. Allena Katz has been notified.  ?

## 2021-06-14 NOTE — Progress Notes (Addendum)
11am: ?CSW spoke with Diplomatic Services operational officer at [facility] to schedule patient a follow up appointment. The appointment is 5/12 at 8am with Dr. Lady Gary. ? ?Appointment information was added to AVS. ? ?7:47am: ?CSW spoke with MD who states patient is not medically cleared for discharge yet due to IV medications. ? ?Edwin Dada, MSW, LCSW ?Transitions of Care  Clinical Social Worker II ?[phone] ? ?

## 2021-06-14 NOTE — Progress Notes (Signed)
Pharmacy Antibiotic Note ? ?Logan Nelson is a 56 y.o. male admitted on 05/10/2021 with malnutrition and failure to thrive.  Present hospitalization complicated by possible sepsis with new fever (Tm 102.9), WBC 13.1, CXR concerning for LLL infiltrate.  Pharmacy has been consulted for Cefepime dosing for sepsis. ? ?Plan: ?Cefepime 2mg  IV q8h ?Flagyl per MD ?Follow-up cx data and clinical progress. ? ?Height: 5\' 6"  (167.6 cm) ?Weight: 70 kg (154 lb 5.2 oz) ?IBW/kg (Calculated) : 63.8 ? ?Temp (24hrs), Avg:100 ?F (37.8 ?C), Min:97.8 ?F (36.6 ?C), Max:102.9 ?F (39.4 ?C) ? ?Recent Labs  ?Lab 06/08/21 ?0237 06/09/21 ?08/08/21 06/10/21 ?2130 06/11/21 ?8657 06/12/21 ?0957 06/13/21 ?1414 06/14/21 ?1127 06/14/21 ?1655  ?WBC 8.7 9.1  --   --   --  15.2*  --  13.1*  ?CREATININE 0.43* 0.56* 0.52* 0.44* 0.50* 0.57* 0.60*  --   ?  ?Estimated Creatinine Clearance: 90.8 mL/min (A) (by C-G formula based on SCr of 0.6 mg/dL (L)).   ? ?No Known Allergies ? ?Thank you for allowing pharmacy to be a part of this patientLogans care. ? ?Vinessa Macconnell, 08/14/21 ?06/14/2021 7:55 PM ? ?

## 2021-06-14 NOTE — TOC Progression Note (Addendum)
Transition of Care (TOC) - Progression Note  ? ? ?Patient Details  ?Name: Logan Nelson ?MRN: [id number] ?Date of Birth: Jul 07, 1965 ? ?Transition of Care (TOC) CM/SW Contact  ?Curlene Labrum, RN ?Phone Number: ?06/14/2021, 10:35 AM ? ?Clinical Narrative:    ?CM met with the patient at the bedside.  The patient states that he placed his name on the waiting list at [facility] on Friday, 06/11/2021.  The patient's cell phone was placed at the secretary's desk to charge at this time and bedside nursing was notified to return to the patient once it is fully charged.  The patient states that his friend, Alex Gardener may be willing to allow him to stay at his home but he could not remember the number.  I encouraged the patient to call Mr. Maren Beach this afternoon.  Otherwise, the patient will be discharged to the [facility] on Wednesday, 06/16/2021 by cab since the patient will be provided with a wheelchair for mobility in the community versus a RW that has been given to the family on an earlier admission date. ? ?CM called and spoke with Bethanne Ginger with [employer] to follow up regarding delivery of the wheelchair to the hospital room. ?I called [employer] and wheelchair to be provided through [place] approved by Olga Coaster, RNCM Supervisor and request sent to Joella Prince, Agricultural engineer.  [employer] to deliver to the patient's prior to discharge to [facility] on Wednesday. ? ?I spoke with the attending, Dr. Posey Pronto, and he is aware that the patient has no availability to housing at this time and home health is not an option for assistance with dressing changes since he does not have an address.  Dressing supplies for sacrum wound will be provided to the patient prior to discharge home.  Bedside nursing was asked to please provide supplies to the patient and teaching as necessary for care of the wound in the community. ? ?Patient was set up with follow up appointment at [facility] - scheduled for  06/18/2021 at 8 am.  Follow up information placed in the AVS/ Discharge instructions. ? ?Discharge medications will be provided through [facility]. ? ? ?CM and MSW will provide for charity cab ride to [facility] if patient is unable to find a willing family member/friend to provide safe housing for the patient. ? ? ?Expected Discharge Plan: [place] ?Barriers to Discharge: Continued Medical Work up, Inadequate or no insurance, SNF Pending payor source - LOG ? ?Expected Discharge Plan and Services ?Expected Discharge Plan: [place] ?In-house Referral: Clinical Social Work ?Discharge Planning Services: CM Consult ?  ?Living arrangements for the past 2 months: [place] ?                ?  ?  ?  ?  ?  ?  ?  ?  ?  ?  ? ? ?Social Determinants of Health (SDOH) Interventions ?  ? ?Readmission Risk Interventions ? ?  06/08/2021  ?  1:48 PM 05/05/2021  ? 10:01 AM  ?Readmission Risk Prevention Plan  ?Transportation Screening Complete Complete  ?Medication Review (RN Care Manager) Referral to Pharmacy Referral to Pharmacy  ?PCP or Specialist appointment within 3-5 days of discharge Complete Complete  ?[place] or Home Care Consult Patient refused Patient refused  ?SW Recovery Care/Counseling Consult Complete Complete  ?Palliative Care Screening Not Applicable Not Applicable  ?Skilled Nursing Facility Not Applicable Complete  ? ? ?

## 2021-06-14 NOTE — Discharge Instructions (Signed)
Please present to [facility] ([address], [place], [place] - [phone]) on Friday May 12th, 2023 at 8am for your appointment with Dr. Lady Gary for wound care follow up. ?

## 2021-06-14 NOTE — Progress Notes (Cosign Needed)
Pt was observed sitting on side of bed and was educated by nursing students ([facility] Lillia Abed, Hyman Bible) to lay in bed with side rails up to avoid risk of falls. Pt stated "Now what we're not going to do is this" and refused to lay in bed with side rails up. Pt was asked if he understood the risk of falls and stated "yes". RN notified and observed telling pt not to stand or move without assistance. ?

## 2021-06-15 ENCOUNTER — Inpatient Hospital Stay (HOSPITAL_COMMUNITY): Payer: Medicaid Other

## 2021-06-15 DIAGNOSIS — B9561 Methicillin susceptible Staphylococcus aureus infection as the cause of diseases classified elsewhere: Secondary | ICD-10-CM

## 2021-06-15 DIAGNOSIS — L89153 Pressure ulcer of sacral region, stage 3: Secondary | ICD-10-CM

## 2021-06-15 DIAGNOSIS — R7881 Bacteremia: Secondary | ICD-10-CM

## 2021-06-15 LAB — BLOOD CULTURE ID PANEL (REFLEXED) - BCID2

## 2021-06-15 LAB — PROCALCITONIN: Procalcitonin: 1.34 ng/mL

## 2021-06-15 LAB — ECHOCARDIOGRAM COMPLETE
AR max vel: 2.94 cm2
AV Area VTI: 2.61 cm2
AV Area mean vel: 2.89 cm2
AV Mean grad: 3 mmHg
AV Peak grad: 4.5 mmHg
Ao pk vel: 1.06 m/s
Calc EF: 38.4 %
Height: 66 in
MV M vel: 4.97 m/s
MV Peak grad: 98.8 mmHg
S' Lateral: 4.3 cm
Single Plane A2C EF: 34.7 %
Single Plane A4C EF: 40.1 %
Weight: 2469.15 oz

## 2021-06-15 MED ORDER — CEFAZOLIN SODIUM-DEXTROSE 2-4 GM/100ML-% IV SOLN
2.0000 g | Freq: Three times a day (TID) | INTRAVENOUS | Status: DC
  Administered 2021-06-15 – 2021-06-16 (×3): 2 g via INTRAVENOUS
  Filled 2021-06-15 (×4): qty 100

## 2021-06-15 MED ORDER — VANCOMYCIN HCL 1250 MG/250ML IV SOLN: 1250.0000 mg | Freq: Two times a day (BID) | INTRAVENOUS | Status: DC

## 2021-06-15 MED ORDER — VANCOMYCIN HCL 1750 MG/350ML IV SOLN
1750.0000 mg | Freq: Once | INTRAVENOUS | Status: DC
  Filled 2021-06-15: qty 350

## 2021-06-15 MED ORDER — METRONIDAZOLE 500 MG/100ML IV SOLN: 500.0000 mg | Freq: Two times a day (BID) | INTRAVENOUS | Status: DC

## 2021-06-15 MED ORDER — METRONIDAZOLE 500 MG/100ML IV SOLN
500.0000 mg | Freq: Two times a day (BID) | INTRAVENOUS | Status: DC
  Administered 2021-06-15: 500 mg via INTRAVENOUS
  Filled 2021-06-15: qty 100

## 2021-06-15 NOTE — Progress Notes (Signed)
?Progress Note ?Patient: Logan Nelson TDD:[id number] DOB: Nov 23, 1965 DOA: 05/10/2021  ?DOS: the patient was seen and examined on 06/15/2021 ? ?Brief hospital course: ?Logan Nelson is a 56 y.o. male with past medical history of severe protein calorie malnutrition, hypertension, anasarca, abdominal wall hernia presented to the hospital with multiple falls and swelling. ?Found to have failure to thrive with protein calorie malnutrition currently improving. ?Now septic with MSSA bacteremia.  ID following.  Will be undergoing MRI of the spine and sacrum to rule out osteomyelitis. ?Echo also shows worsening EF.  No vegetation. ? ?Assessment and Plan: ?Sepsis secondary to MSSA bacteremia. Not present on admission. ?Develops fever, leukocytosis, tachycardia and tachypnea on 5/8. ?Chest x-ray shows possible left lower lobe atelectasis/infiltrate ?X-ray abdomen shows evidence of ileus. ?ESR elevated.  Procalcitonin elevated.  CRP elevated. ?Blood cultures positive for MSSA bacteremia. ?Check MRI spine.  MRI sacrum. ?Echocardiogram negative for any vegetation. ?ID following. ?Currently on IV cefazolin. ?Will continue IV Flagyl for now. ? ?Hypoalbuminemia causing edema. ?Severe mitral regurgitation. ?Acute HFpEF ?Patient was on aggressive diuresis here in the hospital. ?Echocardiogram 3/23 shows EF of 60-65% with moderate to severe MR due to flail leaflet.  ?Echocardiogram on 5/9 shows evidence of worsening EF. ?Serum creatinine remaining stable despite aggressive diuresis but now appears to have rising BUN. ?Hold diuresis in the setting of of possible sepsis. ?  ?Adult failure to thrive. ?Severe protein calorie malnutrition. ?Body mass index is 24.89 kg/m?Marland Kitchen ?Nutrition Problem: Severe Malnutrition ?Etiology: chronic illness ?Nutrition Interventions: ?Interventions: MVI, Boost Breeze, Refer to RD note for recommendations, Other (Comment), Magic cup (vitamin D, double protein) Present on admission. ?Dietitian  consulted. ?CT abdomen and pelvis without any acute abnormality but distended stomach. ?Patient did have some diarrhea intermittently, unsure of malabsorption syndrome/inflammatory bowel condition. ?[facility] consulted.  Recommend colonoscopy and EGD once he is ambulatory with improvement in nutritional status. ?Outpatient follow-up with GI recommended. ?  ?Right-sided weakness. ?Deconditioning. ?Presents with multiple falls.  Weakness appears to be more on the right upper extremity. ?Neurology was consulted.  Unable to identify and pinpoint an etiology based on the work-up. Recommended outpatient follow-up and treatment of malnutrition. ?MRIs were done, C-spine was concerning for enhancement for infection, neurosurgery was consulted and they felt that there is no evidence of infection and no further work-up was recommended. ?B1 normal, B12 low normal, vitamin D low, vitamin A low.  All currently being replaced. ?ESR normal.  CRP mildly elevated but improving.  CK normal.  TSH normal.  Phosphorus level was low but now replaced. ?PT OT consulted recommended SNF although patient is able to ambulate once he stands up. ?Currently looking for placement at a shelter once volume overload is treated. ? ?Brachial plexus issue cannot be ruled out. ?Outpatient EMG recommended for right upper extremity. ?  ?Pressure ulcer stage II, present on admission at left buttock. ?Continue wound care. ?Currently receiving Dakin's ?Wound is tunneling per wound care's note and continues to have purulent greenish discharge. ?Patient also is not assisting with hygiene and dressing changes consistently. ?Will request wound care to come back to see the patient pending results of the MRI. ?  ?Possible ileus. ?Recently repair of incarcerated ventral incisional hernia. ?CT abdomen unremarkable for any acute abnormality.  Monitor for now. ?I do not think the patient is having ileus although gaseous distention and carvedilol. Continue  simethicone. ?  ?Neck pain.  Spasticity ?Continue Toradol. ?Dose increased from 15 mg to 30 mg.   ?Also baclofen dose increased  from 5 mg to 10 mg.   ?Also Percocet dose increased from 1 to 2 tablets to 2 tablets.   ?Lidocaine patch at the local site.  Also use heating pad as needed. ?Add as needed morphine. ?  ?HTN. ?Blood pressure stable. ?Continue current regimen. ?  ?Sinus tachycardia/SVT ?EKG shows sinus tachycardia. ?Intermittently appears to have some SVTs as well. ?Lopressor added.  Monitor. ?  ?Iron deficiency anemia likely from nutritional deficiency. ?Monitor H&H as needed. ?Transfer hemoglobin less than 7. ?So far 3 PRBC transfusion. ?GI was consulted.  No active bleeding seen. ?Treated with IV iron, subcutaneous B12 injection ?Continue oral iron supplements and B complex. ?  ?Hypokalemia, hypocalcemia, hypophosphatemia, hypomagnesemia. ?Currently being replaced. ?Monitor. ? ?Subjective: No nausea no vomiting no fever no chills.  Neck pain improving. ? ?Physical Exam: ?Vitals:  ? 06/15/21 1139 06/15/21 1536 06/15/21 2030 06/15/21 2102  ?BP: (!) 115/91 (!) 122/97 (!) 150/119   ?Pulse: (!) 117 (!) 111 (!) 143   ?Resp: _0 (!) 22  ?Temp: 98.6 ?F (37 ?C) 98.9 ?F (37.2 ?C) 98.5 ?F (36.9 ?C)   ?TempSrc: Oral Oral Oral   ?SpO2: (!) 82% 100% 90%   ?Weight:      ?Height:      ? ?General: Appear in mild distress; no visible Abnormal Neck Mass Or lumps, Conjunctiva normal ?Cardiovascular: S1 and S2 Present, no Murmur, ?Respiratory: good respiratory effort, Bilateral Air entry present and CTA, no Crackles, no wheezes ?Abdomen: Bowel Sound present, Non tender, chronically distended ?Extremities: Right upper extremity edema, bilateral pedal edema ?Neurology: alert and oriented to time, place, and person ?Gait not checked due to patient safety concerns  ? ?Data Reviewed: ?I have Reviewed nursing notes, Vitals, and Lab results since pt's last encounter. Pertinent lab results CBC and BMP ?I have ordered test  including CBC and BMP ?I have ordered imaging studies MRI spine and sacrum. ?I have discussed pt's care plan and test results with infectious disease.  ? ?Family Communication: None at bedside ? ?Disposition: ?Status is: Inpatient ?Remains inpatient appropriate because: Requiring IV antibiotics ? ?Author: ?Berle Mull, MD ?06/15/2021 10:37 PM ? ?Please look on [email or web address] to find out who is on call. ?

## 2021-06-15 NOTE — Consult Note (Addendum)
I have seen and examined the patient. I have personally reviewed the clinical findings, laboratory findings, microbiological data and imaging studies. The assessment and treatment plan was discussed with the Nurse Practitioner Jeanine Luz. I agree with her/his recommendations except following additions/corrections.  Nosocomial onset of MSSA bacteremia in a patient with prolonged hospitalization with initial non specific findings in MRI spine with low concerns for infection/intervention recommended per Neurosurgery. No phlebitis on exam, Has stage 3 sacral ulcer with tunneling and draining purulent malodorous  drainage noted by WOC. Complains of back pain that gets worse on coughing but no spinal tenderness.   Exam - poor oral dentition with multiple missing teeth             Awake, alert and orientded, rt sided weakness ( RUE>>RLE). No spinal tenderness. No known hardwares             Heart/lung and Abdomen- WNL  Switch cefazolin and metronidazole to Unasyn Repeat 2 set of blood cultures Start with TTE, needs TEE if TTE negative given high burden bacteremia Fu pending MRIs Monitor for metastatic sites of infection Monitor CBC and BMP  Odette Fraction, MD Infectious Disease Physician [facility] [address] [place], [place] [address] Phone: [phone]  Fax: [phone]    [facility]    Date of Admission:  05/10/2021     Total days of antibiotics 2               Reason for Consult: MSSA Bacteremia  Referring Provider: Candy Sledge / [employer]  Primary Care Provider: [name], MD   ASSESSMENT:  Mr. Sellards is a 56 y/o male admitted with lower extremity weakness believed to be related to protein calorie malnutrition and deconditioning with hospital course complicated by new development of MSSA bacteremia with acute onset fever on 06/14/21. Source of infection is unclear although does have  Stage 3 pressure ulcer of the coccyx with MRI pending. Wound does not have any signs of overt infection. TTE and follow up blood cultures have been ordered. Antibiotics de-escalated to Cefazolin.  Will await MRI results and TTE to rule out endocarditis. Continue wound care per Wound RN recommendations. Remaining medical and supportive care per primary team.   PLAN:  Continue cefazolin Repeat blood cultures in the morning. TTE to check for endocarditis. Sacral wound care per Wound RN recommendations Await MRI results to check for osteomyelitis.  Remaining medical and supportive care per primary team.   ID will follow.    Principal Problem:   Lower extremity weakness Active Problems:   Hypertension   Protein-calorie malnutrition, severe   Hypokalemia   Anemia of chronic disease   GERD (gastroesophageal reflux disease)   Generalized weakness   Hypophosphatemia   Hypomagnesemia   Vitamin D deficiency   Pressure injury of buttock, stage 2 (HCC)   Pressure injury of sacral region, unstageable (HCC)   Anasarca   Acute heart failure with preserved ejection fraction (HFpEF) (HCC)    amLODipine  2.5 mg Oral Daily   aspirin  81 mg Oral Daily   baclofen  10 mg Oral TID   cholestyramine  4 g Oral BID   feeding supplement  1 Container Oral TID BM   folic acid  1 mg Oral Daily   ketorolac  30 mg Intravenous Q8H   lidocaine  2 patch Transdermal Q24H   multivitamin with minerals  1 tablet Oral Daily   pantoprazole  40 mg Oral BID AC   potassium chloride  20 mEq Oral Daily   simethicone  80 mg Oral QID   vitamin A  10,000 Units Oral Daily   vitamin B-12  1,000 mcg Oral Daily   Vitamin D (Ergocalciferol)  50,000 Units Oral Q7 days     HPI: Logan Nelson is a 56 y.o. male with previous medical history of hypertension, abdominal wall hernias, and GERD admitted with weakness.   Mr. Menzel was seen in the ED on multiple occasions followed by admission from 04/11/21-04/14/21 with poor  nutritional status and weight loss and severe abdominal hernias resulting in abdominal pain and nausea. Followed up with General Surgery on 04/20/21 with recommendation for hospital admission for TPN in order to proceed with surgery and underwent open umbilical and epigastric repair and inguinal hernia repair with mesh on 04/30/21. Treated with a course of Unaysn for presumed aspiration. SNF placement was recommended, however he left AMA on 05/05/21.  Mr. Nicolo returned to the hospital on 05/10/21 with inability to walk since surgery and experienced multiple falls. Neurology consulted for right sided weakness and gait difficulty. MRI brain, cervical and thoracic with nonspecific findings without clear cause. Neurosurgery with no apparent infectious process and no indication for surgical intervention. Weakness and dysfunction believed to be secondary to deconditioning and severe malnutrition. Developed watery diarrhea on 4/9 with GI pathogen panel and C. Diff negative and improved over time. Disposition was a challenge as he is homeless and without insurance for SNF. Admitted with Stage II pressure wound of coccyx and re-assessed as unstageable on 05/27/21 by WOC RN. On 05/31/21 noted to have Hgb of 6.4 which was down trending and given PRBC. CT abdomen/pelvis 06/01/21 suggestive of ileus and was tolerating diet with no surgical intervention necessary. GI consulted for anemia and malnutrition recommended EGD and colonoscopy when able to tolerate.   Wound RN follow up on 06/07/21 with Stage 3 coccyx wound which had decreased in size but now with tunneling and draining purulent malodorous drainage. Continued to progress until 06/14/21 when he had a fever of 101.5 F with tachycardia. Blood cultures were drawn and positive for MSSA bacteremia. Mr. Heaster was initially started on cefepime and metronidazole. Chest x-ray suggestive of CHF and possible atelectasis/pneumonia. Antibiotics de-escalated to Cefazolin.   Review of  Systems: Review of Systems  Constitutional:  Negative for chills, fever and weight loss.  Respiratory:  Negative for cough, shortness of breath and wheezing.   Cardiovascular:  Negative for chest pain and leg swelling.  Gastrointestinal:  Negative for abdominal pain, constipation, diarrhea, nausea and vomiting.  Skin:  Negative for rash.    Past Medical History:  Diagnosis Date   Anemia    Arthritis    Bradycardia    Dyspnea    Falling episodes    GERD (gastroesophageal reflux disease)    Hernia of abdominal wall    Hypertension    Lower extremity edema    Past Surgical History:  Procedure Laterality Date   ABDOMINAL SURGERY     INGUINAL HERNIA REPAIR Right 04/30/2021   Procedure: HERNIA REPAIR INGUINAL WITH MESH;  Surgeon: Stechschulte, Hyman Hopes, MD;  Location: [facility] OR;  Service: General;  Laterality: Right;   LYSIS OF ADHESION  04/30/2021   Procedure: LYSIS OF ADHESION;  Surgeon: Quentin Ore, MD;  Location: [facility] OR;  Service: General;;   UMBILICAL HERNIA REPAIR N/A 04/30/2021   Procedure: OPEN UMBILICAL AND EPIGASTRIC HERNIA REPAIR;  Surgeon: Quentin Ore, MD;  Location: [facility] OR;  Service: General;  Laterality: N/A;    Social History   Tobacco Use   Smoking status: Former    Types: Cigarettes    Quit date: 04/28/2020    Years since quitting: 1.1   Smokeless tobacco: Never  Vaping Use   Vaping Use: Never used  Substance Use Topics   Alcohol use: Not Currently   Drug use: Not Currently    No family history on file.  No Known Allergies  OBJECTIVE: Blood pressure (!) 139/101, pulse (!) 110, temperature 98.5 F (36.9 C), temperature source Oral, resp. rate 20, height 5\' 6"  (1.676 m), weight 70 kg, SpO2 93 %.  Physical Exam Constitutional:      General: He is not in acute distress.    Appearance: He is well-developed.  HENT:     Mouth/Throat:     Dentition: Abnormal dentition.  Cardiovascular:     Rate and Rhythm: Regular rhythm. Tachycardia present.      Heart sounds: Normal heart sounds.  Pulmonary:     Effort: Pulmonary effort is normal.     Breath sounds: Normal breath sounds.  Skin:    General: Skin is warm and dry.  Neurological:     Mental Status: He is alert and oriented to person, place, and time.    Lab Results Lab Results  Component Value Date   WBC 13.1 (H) 06/14/2021   HGB 9.2 (L) 06/14/2021   HCT 29.0 (L) 06/14/2021   MCV 91.8 06/14/2021   PLT 351 06/14/2021    Lab Results  Component Value Date   CREATININE 0.60 (L) 06/14/2021   BUN 29 (H) 06/14/2021   NA 140 06/14/2021   K 4.0 06/14/2021   CL 103 06/14/2021   CO2 29 06/14/2021    Lab Results  Component Value Date   ALT 22 06/13/2021   AST 21 06/13/2021   ALKPHOS 94 06/13/2021   BILITOT 0.6 06/13/2021     Microbiology: Recent Results (from the past 240 hour(s))  Culture, blood (Routine X 2) w Reflex to ID Panel     Status: None (Preliminary result)   Collection Time: 06/14/21  4:45 PM   Specimen: BLOOD  Result Value Ref Range Status   Specimen Description BLOOD LEFT ANTECUBITAL  Final   Special Requests   Final    BOTTLES DRAWN AEROBIC AND ANAEROBIC Blood Culture results may not be optimal due to an inadequate volume of blood received in culture bottles   Culture  Setup Time   Final    GRAM POSITIVE COCCI IN CLUSTERS AEROBIC BOTTLE ONLY CRITICAL VALUE NOTED.  VALUE IS CONSISTENT WITH PREVIOUSLY REPORTED AND CALLED VALUE. Performed at [facility], [address] [place], [place] [address]    Culture GRAM POSITIVE COCCI  Final   Report Status PENDING  Incomplete  Culture, blood (Routine X 2) w Reflex to ID Panel     Status: None (Preliminary result)   Collection Time: 06/14/21  4:55 PM   Specimen: BLOOD  Result Value Ref Range Status   Specimen Description BLOOD RIGHT ANTECUBITAL  Final   Special Requests   Final    BOTTLES DRAWN AEROBIC AND ANAEROBIC Blood Culture adequate volume   Culture  Setup Time   Final    GRAM POSITIVE COCCI IN  CLUSTERS AEROBIC BOTTLE ONLY CRITICAL RESULT CALLED TO, READ BACK BY AND VERIFIED WITH: PHARM D P.VEGA ON [id number] AT 0825 BY E.PARRISH Performed at [facility], [address]  [place], [place] [address]    Culture GRAM POSITIVE COCCI  Final   Report Status PENDING  Incomplete  Blood Culture ID Panel (Reflexed)     Status: Abnormal   Collection Time: 06/14/21  4:55 PM  Result Value Ref Range Status   Enterococcus faecalis NOT DETECTED NOT DETECTED Final   Enterococcus Faecium NOT DETECTED NOT DETECTED Final   Listeria monocytogenes NOT DETECTED NOT DETECTED Final   Staphylococcus species DETECTED (A) NOT DETECTED Final    Comment: CRITICAL RESULT CALLED TO, READ BACK BY AND VERIFIED WITH: PHARM D P.VEGA ON [id number] AT 0825 BY E.PARRISH    Staphylococcus aureus (BCID) DETECTED (A) NOT DETECTED Final    Comment: CRITICAL RESULT CALLED TO, READ BACK BY AND VERIFIED WITH: PHARM D P.VEGA ON [id number] AT 0825 BY E.PARRISH    Staphylococcus epidermidis NOT DETECTED NOT DETECTED Final   Staphylococcus lugdunensis NOT DETECTED NOT DETECTED Final   Streptococcus species NOT DETECTED NOT DETECTED Final   Streptococcus agalactiae NOT DETECTED NOT DETECTED Final   Streptococcus pneumoniae NOT DETECTED NOT DETECTED Final   Streptococcus pyogenes NOT DETECTED NOT DETECTED Final   A.calcoaceticus-baumannii NOT DETECTED NOT DETECTED Final   Bacteroides fragilis NOT DETECTED NOT DETECTED Final   Enterobacterales NOT DETECTED NOT DETECTED Final   Enterobacter cloacae complex NOT DETECTED NOT DETECTED Final   Escherichia coli NOT DETECTED NOT DETECTED Final   Klebsiella aerogenes NOT DETECTED NOT DETECTED Final   Klebsiella oxytoca NOT DETECTED NOT DETECTED Final   Klebsiella pneumoniae NOT DETECTED NOT DETECTED Final   Proteus species NOT DETECTED NOT DETECTED Final   Salmonella species NOT DETECTED NOT DETECTED Final   Serratia marcescens NOT DETECTED NOT DETECTED Final   Haemophilus  influenzae NOT DETECTED NOT DETECTED Final   Neisseria meningitidis NOT DETECTED NOT DETECTED Final   Pseudomonas aeruginosa NOT DETECTED NOT DETECTED Final   Stenotrophomonas maltophilia NOT DETECTED NOT DETECTED Final   Candida albicans NOT DETECTED NOT DETECTED Final   Candida auris NOT DETECTED NOT DETECTED Final   Candida glabrata NOT DETECTED NOT DETECTED Final   Candida krusei NOT DETECTED NOT DETECTED Final   Candida parapsilosis NOT DETECTED NOT DETECTED Final   Candida tropicalis NOT DETECTED NOT DETECTED Final   Cryptococcus neoformans/gattii NOT DETECTED NOT DETECTED Final   Meth resistant mecA/C and MREJ NOT DETECTED NOT DETECTED Final    Comment: Performed at [facility], [address], [place], [place] [address]     Marcos Eke, NP [facility]  06/15/2021  8:56 AM

## 2021-06-15 NOTE — Progress Notes (Signed)
Occupational Therapy Treatment ?Patient Details ?Name: Logan Nelson ?MRN: [id number] ?DOB: 14-Mar-1965 ?Today's Date: 06/15/2021 ? ? ?History of present illness Pt is a 61 admitted 4/3 with quickly progressing B LE weakness.  Recently hospitalize with incarcerated abdominal hernia, was repaired, but pt left AMA before he was safe to mobilize,  MRI of brain, cervical/thoracic spine inconclusive.  Work up continues.  PMHx: anemia, arthritis, dyspnea, GERD, HTN, LE edema ?  ?OT comments ? Selim is making incremental progress, goals updated - see care plan. Pt required gentle encouragement for participation, ultimately motivated for bathroom transfer. However once OOB pt ambulated from his bed to the hallway, seemingly forgetting the plan for the session, and continued to deny plan for toileting despite max cues. He tolerated hallway ambulation with minG, no sitting rest break needed, no LOB. He remains limited by the deficits listed below. OT to continue to follow. D/c remains appropriate.   ? ?Recommendations for follow up therapy are one component of a multi-disciplinary discharge planning process, led by the attending physician.  Recommendations may be updated based on patient status, additional functional criteria and insurance authorization. ?   ?Follow Up Recommendations ? Skilled nursing-short term rehab (<3 hours/day)  ?  ?Assistance Recommended at Discharge Frequent or constant Supervision/Assistance  ?Patient can return home with the following ? A lot of help with walking and/or transfers;A lot of help with bathing/dressing/bathroom;Assist for transportation;Assistance with cooking/housework ?  ?Equipment Recommendations ? BSC/3in1;Tub/shower bench;Wheelchair (measurements OT);Wheelchair cushion (measurements OT)  ?  ?Recommendations for Other Services   ? ?  ?Precautions / Restrictions Precautions ?Precautions: Fall ?Restrictions ?Weight Bearing Restrictions: No  ? ? ?  ? ?Mobility Bed Mobility ?Overal  bed mobility: Needs Assistance ?Bed Mobility: Sit to Supine, Supine to Sit ?  ?  ?Supine to sit: Min assist ?Sit to supine: Min assist ?  ?  ?  ? ?Transfers ?Overall transfer level: Needs assistance ?Equipment used: Rolling walker (2 wheels) ?Transfers: Sit to/from Stand ?Sit to Stand: Min guard ?  ?  ?  ?  ?  ?General transfer comment: pt initially unsteady ?  ?  ?Balance Overall balance assessment: Needs assistance ?Sitting-balance support: Feet supported ?Sitting balance-Leahy Scale: Fair ?  ?  ?Standing balance support: Bilateral upper extremity supported ?Standing balance-Leahy Scale: Poor ?  ?  ?  ?  ?  ?  ?  ?  ?  ?  ?  ?  ?   ? ?ADL either performed or assessed with clinical judgement  ? ?ADL Overall ADL's : Needs assistance/impaired ?  ?  ?  ?  ?  ?  ?  ?  ?  ?  ?  ?  ?  ?  ?  ?  ?  ?  ?Functional mobility during ADLs: Min guard;Rolling walker (2 wheels) ?General ADL Comments: intially planned to complete ADLS, pt declined once OOB and mobilizing. FOcused on activity tolerance.Pt walked withRW and min G, no LOB and no rest break required ?  ? ?Extremity/Trunk Assessment Upper Extremity Assessment ?Upper Extremity Assessment: RUE deficits/detail;LUE deficits/detail ?RUE Deficits / Details: increased functional grasp ?LUE Deficits / Details: generally weak overall at >=3/5 ?  ?Lower Extremity Assessment ?Lower Extremity Assessment: Defer to PT evaluation ?  ?  ?  ? ?Vision   ?Vision Assessment?: No apparent visual deficits ?  ?Perception Perception ?Perception: Within Functional Limits ?  ?Praxis Praxis ?Praxis: Intact ?  ? ?Cognition Arousal/Alertness: Awake/alert ?Behavior During Therapy: [facility] for tasks assessed/performed ?Overall Cognitive Status:  No family/caregiver present to determine baseline cognitive functioning ?Area of Impairment: Safety/judgement, Awareness ?  ?  ?  ?  ?  ?  ?  ?  ?Orientation Level: Disoriented to ?Current Attention Level: Selective ?Memory: Decreased short-term memory ?Following  Commands: Follows one step commands with increased time ?Safety/Judgement: Decreased awareness of safety ?Awareness: Intellectual ?Problem Solving: Requires verbal cues, Requires tactile cues ?General Comments: initially delcining to participate, once therapist brought awareness to soiled pad pt was agreeable to get OOB to go to the bathroom - once up and mobilizing, pt went past the bathroom and denied remembering that was the plan. ?  ?  ?   ?Exercises   ? ?  ?Shoulder Instructions   ? ? ?  ?General Comments soiled upon arrival and unaware  ? ? ?Pertinent Vitals/ Pain       Pain Assessment ?Pain Assessment: No/denies pain ?Pain Intervention(s): Monitored during session ? ?Home Living   ?  ?  ?  ?  ?  ?  ?  ?  ?  ?  ?  ?  ?  ?  ?  ?  ?  ?  ? ?  ?Prior Functioning/Environment    ?  ?  ?  ?   ? ?Frequency ? Min 2X/week  ? ? ? ? ?  ?Progress Toward Goals ? ?OT Goals(current goals can now be found in the care plan section) ? Progress towards OT goals: Goals met and updated - see care plan ? ?Acute Rehab OT Goals ?Patient Stated Goal: to get out of here ?OT Goal Formulation: With patient ?Time For Goal Achievement: 06/29/21 ?Potential to Achieve Goals: Fair ?ADL Goals ?Pt Will Perform Grooming: with supervision;standing ?Pt Will Perform Lower Body Bathing: with supervision;sitting/lateral leans;sit to/from stand ?Pt Will Perform Upper Body Dressing: with supervision;sitting ?Pt Will Perform Lower Body Dressing: with supervision;sit to/from stand ?Pt Will Transfer to Toilet: with supervision;ambulating ?Pt Will Perform Toileting - Clothing Manipulation and hygiene: with supervision;sitting/lateral leans ?Pt/caregiver will Perform Home Exercise Program: Increased strength;Both right and left upper extremity;With theraband;With Supervision;With written HEP provided ?Additional ADL Goal #1: Pt will tolerate 3 minutes of dynamic standing task wtih min G as a precursor to ADL performance  ?Plan Discharge plan remains  appropriate   ? ?Co-evaluation ? ? ?   ?  ?  ?  ?  ? ?  ?AM-PAC OT "6 Clicks" Daily Activity     ?Outcome Measure ? ? Help from another person eating meals?: None ?Help from another person taking care of personal grooming?: A Little ?Help from another person toileting, which includes using toliet, bedpan, or urinal?: A Lot ?Help from another person bathing (including washing, rinsing, drying)?: A Lot ?Help from another person to put on and taking off regular upper body clothing?: A Little ?Help from another person to put on and taking off regular lower body clothing?: A Lot ?6 Click Score: 16 ? ?  ?End of Session Equipment Utilized During Treatment: Rolling walker (2 wheels) ? ?OT Visit Diagnosis: Unsteadiness on feet (R26.81);Other abnormalities of gait and mobility (R26.89);Muscle weakness (generalized) (M62.81);History of falling (Z91.81) ?Hemiplegia - Right/Left: Right ?Hemiplegia - dominant/non-dominant: Dominant ?Hemiplegia - caused by: Unspecified ?  ?Activity Tolerance Patient tolerated treatment well ?  ?Patient Left in bed;with call bell/phone within reach ?  ?Nurse Communication Mobility status ?  ? ?   ? ?Time: 5885-0277 ?OT Time Calculation (min): 20 min ? ?Charges: OT General Charges ?$OT Visit: 1 Visit ?OT Treatments ?$Therapeutic Activity:  8-22 mins ? ? ? ?Nyeshia Mysliwiec A Chandy Tarman ?06/15/2021, 10:15 PM ?

## 2021-06-15 NOTE — Progress Notes (Signed)
PHARMACY - PHYSICIAN COMMUNICATION ?CRITICAL VALUE ALERT - BLOOD CULTURE IDENTIFICATION (BCID) ? ?Logan Nelson is an 56 y.o. male who presented to [facility] on 05/10/2021 with a chief complaint of possible sepsis with new fever. CXR concerning for LLL infiltrate. ? ?Assessment:  2/4 aerobic bottles (2 sets) growing gram positive cocci in clusters. BCID identified staphylococcus aureus. No resistance mechanism identified.  ? ?Name of physician (or Provider) Contacted: Dr. Allena Katz and ID [facility] team ? ?Current antibiotics: Cefepime 2g IV q8h, metronidazole 500 mg q12h, vancomycin 1250 mg q12h ? ?Changes to prescribed antibiotics recommended:  ?Discontinue cefepime and metronidazole ?Can de-escalate to cefazolin 2g q8h ? ? ?Thank you for involving pharmacy in this patient's care. ? ?Arnette Felts, PharmD ?PGY1 Ambulatory Care Pharmacy Resident ?06/15/2021 8:33 AM ? ?**Pharmacist phone directory can be found on [email or web address] listed under [facility]** ? ? ?

## 2021-06-15 NOTE — TOC Progression Note (Addendum)
Transition of Care (TOC) - Progression Note  ? ? ?Patient Details  ?Name: Logan Nelson ?MRN: [id number] ?Date of Birth: 03-08-1965 ? ?Transition of Care (TOC) CM/SW Contact  ?Janae Bridgeman, RN ?Phone Number: ?06/15/2021, 8:18 AM ? ?Clinical Narrative:    ?CM noted patient with new diagnosis of fever, sepsis/ SIRS, LLL infiltrate and possible pneumonia - with start of IV antibiotics including Cefepime and Flagyl IV per attending MD notes.  The patient remains inpatient for needed IV antibiotics, wound care consult, and pending MRI.  ? ?[employer] delivered the patient's wheelchair to the hospital room on 06/14/2021. ? ?The patient's cell phone was charged at the secretary's station and given back to the patient.  I spoke with the patient at length and asked that he continue to speak with family/friends in the area and continue to explore options for housing for discharge at a pending later date - unknown at this time due to need for medical treatment/tests/IV antibiotics for sepsis at this time. ? ?06/15/2021 1010 - CM spoke with bedside nursing and charge RN and the patient's [facility] Wheelchair/cushion was labeled and placed in 3W Room 3 Storage room to hold for the patient for pending discharge at a later date - patient requiring IV antibiotics at this time - discharge date undetermined.  Medically unstable for discharge due to sepsis/wound infections/pneumonia. ? ?Patient states that he spoke with Joylene Grapes - friend who is unable to provide housing for the patient at this time - but the patient states that he will continue to communicate with the friend on the phone regarding his discharge plan and need for safe housing. ? ?CM and MSW with DTP Team will continue to follow the patient for TOC needs. ? ? ?Expected Discharge Plan: Skilled Nursing Facility ?Barriers to Discharge: Continued Medical Work up, Inadequate or no insurance, SNF Pending payor source - LOG ? ?Expected Discharge Plan and  Services ?Expected Discharge Plan: Skilled Nursing Facility ?In-house Referral: Clinical Social Work ?Discharge Planning Services: CM Consult ?  ?Living arrangements for the past 2 months: Single Family Home ?                ?  ?  ?  ?  ?  ?  ?  ?  ?  ?  ? ? ?Social Determinants of Health (SDOH) Interventions ?  ? ?Readmission Risk Interventions ? ?  06/08/2021  ?  1:48 PM 05/05/2021  ? 10:01 AM  ?Readmission Risk Prevention Plan  ?Transportation Screening Complete Complete  ?Medication Review (RN Care Manager) Referral to Pharmacy Referral to Pharmacy  ?PCP or Specialist appointment within 3-5 days of discharge Complete Complete  ?HRI or Home Care Consult Patient refused Patient refused  ?SW Recovery Care/Counseling Consult Complete Complete  ?Palliative Care Screening Not Applicable Not Applicable  ?Skilled Nursing Facility Not Applicable Complete  ? ? ?

## 2021-06-15 NOTE — Progress Notes (Signed)
Pharmacy Antibiotic Note ? ?Logan Nelson is a 56 y.o. male admitted on 05/10/2021 with malnutrition and failure to thrive.  Present hospitalization complicated by possible sepsis with new fever (Tm 102.9), WBC 13.1, CXR concerning for LLL infiltrate.  Patient growing GPC in clusters from 2 sets of blood cultures. Pharmacy has been consulted for Vancomycin for bacteremia. ? ?Plan: ?Vancomycin 1750mg  IV x 1, then 1250mg  IV q12h ?eAUC 472, Scr used 0.6 mg/dl ?Will d/w primary re: deescalation of cefepime/flagyl  ?Follow-up cx data and clinical progress. ? ?Height: 5\' 6"  (167.6 cm) ?Weight: 70 kg (154 lb 5.2 oz) ?IBW/kg (Calculated) : 63.8 ? ?Temp (24hrs), Avg:100.1 ?F (37.8 ?C), Min:98.1 ?F (36.7 ?C), Max:102.9 ?F (39.4 ?C) ? ?Recent Labs  ?Lab 06/09/21 ? 06/10/21 ?08/09/21 06/11/21 ?08/10/21 06/12/21 ?0957 06/13/21 ?1414 06/14/21 ?1127 06/14/21 ?1655  ?WBC 9.1  --   --   --  15.2*  --  13.1*  ?CREATININE 0.56* 0.52* 0.44* 0.50* 0.57* 0.60*  --   ? ?  ?Estimated Creatinine Clearance: 90.8 mL/min (A) (by C-G formula based on SCr of 0.6 mg/dL (L)).   ? ?No Known Allergies ? ?Jazzlin Clements A. 08/13/21, PharmD, BCPS, FNKF ?Clinical Pharmacist ?[place] ?Please utilize Amion for appropriate phone number to reach the unit pharmacist [facility]) ? ?06/15/2021 8:23 AM ? ?

## 2021-06-15 NOTE — Progress Notes (Signed)
CSW cancelled patient's wound care appointment. The appointment will be rescheduled if still needed at time of discharge. ? ?Madilyn Fireman, MSW, LCSW ?Transitions of Care  Clinical Social Worker II ?[phone] ? ?

## 2021-06-15 NOTE — Progress Notes (Signed)
Nutrition Follow-up ? ?DOCUMENTATION CODES:  ?Severe malnutrition in context of chronic illness ? ?INTERVENTION:  ?Continue current diet as ordered, encourage PO intake ?continue double protein portions TID w/ meals ?Continue Boost Breeze po TID, each supplement provides 250 kcal and 9 grams of protein ?continue MVI with minerals daily ?Continue vitamin D 50,000 units weekly (added end date of 5/10) ? ?NUTRITION DIAGNOSIS:  ?Severe Malnutrition related to chronic illness as evidenced by severe fat depletion, severe muscle depletion, percent weight loss (17.9% weight loss in less than 7 months). ?- remains applicable ? ?GOAL:  ?Patient will meet greater than or equal to 90% of their needs ?- progressing, diet in place with good PO intake ? ?MONITOR:  ?PO intake, Supplement acceptance, Labs, Weight trends, Skin, I & O's ? ?REASON FOR ASSESSMENT:  ?Malnutrition Screening Tool, Consult ?Assessment of nutrition requirement/status ? ?ASSESSMENT:  ?56 year old male who presented to the ED on 4/03 with fatigue, weakness, LE edema. PMH of severe malnutrition, HTN, anasarca, GERD, abdominal wall hernia. Pt with recent admission for abdominal hernia with incarceration s/p repair requiring TPN during admission. Pt left AMA from this admission. ? ?Pt undergoing ECHO at bedside at the time of assessment.  ? ?Reviewed intake, consuming some boost breeze, but typically refuses at least 1/day. Weight stable. Will continue current nutrition plan for now. CM working on discharge plan for once pt is no longer requiring IV antibiotics.  ? ?Discontinued vitamin A to avoid toxicity as repletion period was complete. If pt is to remains inpatient for several more weeks, will recheck levels.  ? ?Average Meal Intake: ?4/19-4/25: 100% intake x 8 recorded meals ?4/26-5/2: 100% intake x 5 recorded meals ? ?Nutritionally Relevant Medications: ?Scheduled Meds: ? baclofen  10 mg Oral TID  ? cholestyramine  4 g Oral BID  ? feeding supplement  1  Container Oral TID BM  ? folic acid  1 mg Oral Daily  ? multivitamin with minerals  1 tablet Oral Daily  ? pantoprazole  40 mg Oral BID AC  ? potassium chloride  20 mEq Oral Daily  ? simethicone  80 mg Oral QID  ? vitamin A  10,000 Units Oral Daily  ? vitamin B-12  1,000 mcg Oral Daily  ? Vitamin D (Ergocalciferol)  50,000 Units Oral Q7 days  ? ?Continuous Infusions: ?  ceFAZolin (ANCEF) IV    ? metronidazole    ? ?PRN Meds: ondansetron ? ?Labs Reviewed: ?BUN 29, Creatinine .6 ? ?Vitamin/Mineral Profile: ?Thiamine B1: 127.7 (WNL) on 4/03 ?Vitamin B12: 929 (H) on 3/06 ?Folate B9: 33.3 (WNL) on 3/17 ?Vitamin A: 10.8 (L) on 3/17 ?Vitamin D: 8.19 (L) on 3/17 ?Vitamin E (alpha tocopherol): 6.4 (L), (gamma tocopherol): 2.1 (WNL) on 3/17 ?Copper: 74 (WNL) on 3/17 ?Zinc: 43 (L) on 3/17 ? ?NUTRITION - FOCUSED PHYSICAL EXAM: ?Flowsheet Row Most Recent Value  ?Orbital Region Severe depletion  ?Upper Arm Region Severe depletion  ?Thoracic and Lumbar Region Severe depletion  ?Buccal Region Severe depletion  ?Temple Region Severe depletion  ?Clavicle Bone Region Severe depletion  ?Clavicle and Acromion Bone Region Severe depletion  ?Scapular Bone Region Severe depletion  ?Dorsal Hand Severe depletion  ?Patellar Region Unable to assess  [severe edema to BLE]  ?Anterior Thigh Region Unable to assess  ?Posterior Calf Region Unable to assess  ?Edema (RD Assessment) Severe  [BLE]  ?Hair Reviewed  ?Eyes Reviewed  ?Mouth Reviewed  ?Skin Reviewed  ?Nails Reviewed  ? ?Diet Order:   ?Diet Order   ? ?       ?  Diet regular Room service appropriate? Yes; Fluid consistency: Thin  Diet effective now       ?  ? ?  ?  ? ?  ? ? ?EDUCATION NEEDS:  ?Education needs have been addressed ? ?Skin:  Skin Assessment: Reviewed RN Assessment (per WOC: Stage 3 on coccyx, 3 cm x 2 cm x 2.3 cm. Overall the wound has decreased in size but is tunneling at 12 o'clock 3.5 cm and draining purulent malodorous drainage.) ? ?Last BM:  5/8 ? ?Height:  ?Ht Readings  from Last 1 Encounters:  ?05/13/21 5\' 6"  (1.676 m)  ? ? ?Weight:  ?Wt Readings from Last 1 Encounters:  ?06/14/21 70 kg  ? ? ?Ideal Body Weight:  64.5 kg ? ?BMI:  Body mass index is 24.91 kg/m?. ? ?Estimated Nutritional Needs:  ?Kcal:  2100-2300 ?Protein:  100-115 grams ?Fluid:  >2.0 L ? ? ?Ranell Patrick, RD, LDN ?Clinical Dietitian ?RD pager # available in [place]  ?After hours/weekend pager # available in [place] ?

## 2021-06-15 NOTE — Progress Notes (Signed)
PT Cancellation Note ? ?Patient Details ?Name: Sharrieff Spratlin ?MRN: [id number] ?DOB: 01-17-1966 ? ? ?Cancelled Treatment:    Reason Eval/Treat Not Completed: Patient at procedure or test/unavailable.  Pt heading to procedure on arrival.  States feels bad and doesn't want to do anything today.  But will check back as able and give him another chance to mobilize. ?06/15/2021 ? ?Jacinto Halim., PT ?Acute Rehabilitation Services ?[phone]  (pager) ?[phone]  (office) ? ? ?Eliseo Gum Tobi Leinweber ?06/15/2021, 10:46 AM ?

## 2021-06-15 NOTE — Progress Notes (Signed)
?  Echocardiogram ?2D Echocardiogram has been performed. ? ?Logan Nelson ?06/15/2021, 3:03 PM ?

## 2021-06-16 ENCOUNTER — Inpatient Hospital Stay (HOSPITAL_COMMUNITY)
Admission: EM | Admit: 2021-06-16 | Discharge: 2021-07-05 | DRG: 853 | Disposition: A | Discharge status: Skilled Nursing Facility | Payer: Medicaid Other | Attending: Internal Medicine | Admitting: Internal Medicine

## 2021-06-16 ENCOUNTER — Encounter (HOSPITAL_COMMUNITY)
Admission: EM | Discharge status: Against Medical Advice | Payer: Self-pay | Source: Home / Self Care | Attending: Internal Medicine

## 2021-06-16 ENCOUNTER — Encounter (HOSPITAL_COMMUNITY): Payer: Self-pay | Admitting: Emergency Medicine

## 2021-06-16 ENCOUNTER — Inpatient Hospital Stay (HOSPITAL_COMMUNITY): Payer: Medicaid Other | Admitting: Certified Registered"

## 2021-06-16 ENCOUNTER — Other Ambulatory Visit: Payer: Self-pay

## 2021-06-16 ENCOUNTER — Encounter (HOSPITAL_COMMUNITY): Payer: Self-pay | Admitting: Internal Medicine

## 2021-06-16 DIAGNOSIS — K219 Gastro-esophageal reflux disease without esophagitis: Secondary | ICD-10-CM | POA: Diagnosis present

## 2021-06-16 DIAGNOSIS — Z79899 Other long term (current) drug therapy: Secondary | ICD-10-CM

## 2021-06-16 DIAGNOSIS — G8929 Other chronic pain: Secondary | ICD-10-CM | POA: Diagnosis present

## 2021-06-16 DIAGNOSIS — B9561 Methicillin susceptible Staphylococcus aureus infection as the cause of diseases classified elsewhere: Principal | ICD-10-CM

## 2021-06-16 DIAGNOSIS — I5021 Acute systolic (congestive) heart failure: Secondary | ICD-10-CM | POA: Diagnosis present

## 2021-06-16 DIAGNOSIS — W19XXXA Unspecified fall, initial encounter: Secondary | ICD-10-CM | POA: Diagnosis present

## 2021-06-16 DIAGNOSIS — M009 Pyogenic arthritis, unspecified: Secondary | ICD-10-CM | POA: Diagnosis present

## 2021-06-16 DIAGNOSIS — I110 Hypertensive heart disease with heart failure: Secondary | ICD-10-CM | POA: Diagnosis present

## 2021-06-16 DIAGNOSIS — I10 Essential (primary) hypertension: Secondary | ICD-10-CM | POA: Diagnosis present

## 2021-06-16 DIAGNOSIS — I5031 Acute diastolic (congestive) heart failure: Secondary | ICD-10-CM

## 2021-06-16 DIAGNOSIS — E1169 Type 2 diabetes mellitus with other specified complication: Secondary | ICD-10-CM | POA: Diagnosis present

## 2021-06-16 DIAGNOSIS — M4642 Discitis, unspecified, cervical region: Secondary | ICD-10-CM | POA: Diagnosis present

## 2021-06-16 DIAGNOSIS — E43 Unspecified severe protein-calorie malnutrition: Secondary | ICD-10-CM | POA: Diagnosis present

## 2021-06-16 DIAGNOSIS — L89322 Pressure ulcer of left buttock, stage 2: Secondary | ICD-10-CM | POA: Diagnosis present

## 2021-06-16 DIAGNOSIS — R9431 Abnormal electrocardiogram [ECG] [EKG]: Secondary | ICD-10-CM | POA: Diagnosis present

## 2021-06-16 DIAGNOSIS — J9601 Acute respiratory failure with hypoxia: Secondary | ICD-10-CM | POA: Diagnosis present

## 2021-06-16 DIAGNOSIS — G062 Extradural and subdural abscess, unspecified: Secondary | ICD-10-CM

## 2021-06-16 DIAGNOSIS — L89153 Pressure ulcer of sacral region, stage 3: Secondary | ICD-10-CM

## 2021-06-16 DIAGNOSIS — R188 Other ascites: Secondary | ICD-10-CM | POA: Diagnosis present

## 2021-06-16 DIAGNOSIS — R296 Repeated falls: Secondary | ICD-10-CM | POA: Diagnosis present

## 2021-06-16 DIAGNOSIS — I4891 Unspecified atrial fibrillation: Secondary | ICD-10-CM | POA: Diagnosis present

## 2021-06-16 DIAGNOSIS — I248 Other forms of acute ischemic heart disease: Secondary | ICD-10-CM | POA: Diagnosis present

## 2021-06-16 DIAGNOSIS — R7881 Bacteremia: Secondary | ICD-10-CM

## 2021-06-16 DIAGNOSIS — I4892 Unspecified atrial flutter: Secondary | ICD-10-CM | POA: Diagnosis present

## 2021-06-16 DIAGNOSIS — Y9289 Other specified places as the place of occurrence of the external cause: Secondary | ICD-10-CM

## 2021-06-16 DIAGNOSIS — E876 Hypokalemia: Secondary | ICD-10-CM | POA: Diagnosis present

## 2021-06-16 DIAGNOSIS — Z91199 Patient's noncompliance with other medical treatment and regimen due to unspecified reason: Secondary | ICD-10-CM

## 2021-06-16 DIAGNOSIS — R531 Weakness: Secondary | ICD-10-CM

## 2021-06-16 DIAGNOSIS — K5900 Constipation, unspecified: Secondary | ICD-10-CM | POA: Diagnosis present

## 2021-06-16 DIAGNOSIS — A4101 Sepsis due to Methicillin susceptible Staphylococcus aureus: Principal | ICD-10-CM | POA: Diagnosis present

## 2021-06-16 DIAGNOSIS — R627 Adult failure to thrive: Secondary | ICD-10-CM | POA: Diagnosis present

## 2021-06-16 DIAGNOSIS — D638 Anemia in other chronic diseases classified elsewhere: Secondary | ICD-10-CM | POA: Diagnosis present

## 2021-06-16 DIAGNOSIS — M4802 Spinal stenosis, cervical region: Secondary | ICD-10-CM | POA: Diagnosis present

## 2021-06-16 DIAGNOSIS — D509 Iron deficiency anemia, unspecified: Secondary | ICD-10-CM | POA: Diagnosis present

## 2021-06-16 DIAGNOSIS — L89302 Pressure ulcer of unspecified buttock, stage 2: Secondary | ICD-10-CM | POA: Diagnosis present

## 2021-06-16 DIAGNOSIS — R54 Age-related physical debility: Secondary | ICD-10-CM | POA: Diagnosis present

## 2021-06-16 DIAGNOSIS — M19011 Primary osteoarthritis, right shoulder: Secondary | ICD-10-CM | POA: Diagnosis present

## 2021-06-16 DIAGNOSIS — Z87891 Personal history of nicotine dependence: Secondary | ICD-10-CM

## 2021-06-16 DIAGNOSIS — G061 Intraspinal abscess and granuloma: Secondary | ICD-10-CM | POA: Diagnosis present

## 2021-06-16 DIAGNOSIS — I5181 Takotsubo syndrome: Secondary | ICD-10-CM | POA: Diagnosis present

## 2021-06-16 DIAGNOSIS — M4622 Osteomyelitis of vertebra, cervical region: Secondary | ICD-10-CM | POA: Diagnosis present

## 2021-06-16 DIAGNOSIS — M4628 Osteomyelitis of vertebra, sacral and sacrococcygeal region: Secondary | ICD-10-CM | POA: Diagnosis present

## 2021-06-16 HISTORY — PX: RADIOLOGY WITH ANESTHESIA: SHX6223

## 2021-06-16 LAB — CBC WITH DIFFERENTIAL/PLATELET
Abs Immature Granulocytes: 0.12 10*3/uL — ABNORMAL HIGH (ref 0.00–0.07)
Basophils Absolute: 0 10*3/uL (ref 0.0–0.1)
Basophils Relative: 0 %
Eosinophils Absolute: 0 10*3/uL (ref 0.0–0.5)
Eosinophils Relative: 0 %
HCT: 29 % — ABNORMAL LOW (ref 39.0–52.0)
Hemoglobin: 9.2 g/dL — ABNORMAL LOW (ref 13.0–17.0)
Immature Granulocytes: 1 %
Lymphocytes Relative: 14 %
Lymphs Abs: 2 10*3/uL (ref 0.7–4.0)
MCH: 29.1 pg (ref 26.0–34.0)
MCHC: 31.7 g/dL (ref 30.0–36.0)
MCV: 91.8 fL (ref 80.0–100.0)
Monocytes Absolute: 1.6 10*3/uL — ABNORMAL HIGH (ref 0.1–1.0)
Monocytes Relative: 11 %
Neutro Abs: 10.8 10*3/uL — ABNORMAL HIGH (ref 1.7–7.7)
Neutrophils Relative %: 74 %
Platelets: 346 10*3/uL (ref 150–400)
RBC: 3.16 MIL/uL — ABNORMAL LOW (ref 4.22–5.81)
RDW: 16.2 % — ABNORMAL HIGH (ref 11.5–15.5)
WBC: 14.5 10*3/uL — ABNORMAL HIGH (ref 4.0–10.5)
nRBC: 0.1 % (ref 0.0–0.2)

## 2021-06-16 LAB — CBC
HCT: 27.4 % — ABNORMAL LOW (ref 39.0–52.0)
Hemoglobin: 8.8 g/dL — ABNORMAL LOW (ref 13.0–17.0)
MCH: 29.3 pg (ref 26.0–34.0)
MCHC: 32.1 g/dL (ref 30.0–36.0)
MCV: 91.3 fL (ref 80.0–100.0)
Platelets: 338 10*3/uL (ref 150–400)
RBC: 3 MIL/uL — ABNORMAL LOW (ref 4.22–5.81)
RDW: 16.1 % — ABNORMAL HIGH (ref 11.5–15.5)
WBC: 17.6 10*3/uL — ABNORMAL HIGH (ref 4.0–10.5)
nRBC: 0 % (ref 0.0–0.2)

## 2021-06-16 LAB — BASIC METABOLIC PANEL
Anion gap: 5 (ref 5–15)
Anion gap: 9 (ref 5–15)
BUN: 33 mg/dL — ABNORMAL HIGH (ref 6–20)
BUN: 40 mg/dL — ABNORMAL HIGH (ref 6–20)
CO2: 26 mmol/L (ref 22–32)
CO2: 24 mmol/L (ref 22–32)
Calcium: 8.7 mg/dL — ABNORMAL LOW (ref 8.9–10.3)
Calcium: 9 mg/dL (ref 8.9–10.3)
Chloride: 107 mmol/L (ref 98–111)
Chloride: 108 mmol/L (ref 98–111)
Creatinine, Ser: 0.64 mg/dL (ref 0.61–1.24)
Creatinine, Ser: 0.78 mg/dL (ref 0.61–1.24)
GFR, Estimated: >60 mL/min (ref >60)
GFR, Estimated: >60 mL/min (ref >60)
Glucose, Bld: 124 mg/dL — ABNORMAL HIGH (ref 70–99)
Glucose, Bld: 92 mg/dL (ref 70–99)
Potassium: 4 mmol/L (ref 3.5–5.1)
Potassium: 4.2 mmol/L (ref 3.5–5.1)
Sodium: 138 mmol/L (ref 135–145)
Sodium: 141 mmol/L (ref 135–145)

## 2021-06-16 LAB — MAGNESIUM: Magnesium: 1.9 mg/dL (ref 1.7–2.4)

## 2021-06-16 LAB — PROCALCITONIN: Procalcitonin: 5.33 ng/mL

## 2021-06-16 SURGERY — MRI WITH ANESTHESIA
Anesthesia: General

## 2021-06-16 MED ORDER — SODIUM CHLORIDE 0.9 % IV SOLN
3.0000 g | Freq: Four times a day (QID) | INTRAVENOUS | Status: DC
  Administered 2021-06-16: 3 g via INTRAVENOUS
  Filled 2021-06-16 (×3): qty 8

## 2021-06-16 MED ORDER — METOPROLOL TARTRATE 25 MG PO TABS: 25.0000 mg | ORAL_TABLET | Freq: Two times a day (BID) | ORAL | Status: DC

## 2021-06-16 MED ORDER — FUROSEMIDE 10 MG/ML IJ SOLN: 20.0000 mg | Freq: Two times a day (BID) | INTRAMUSCULAR | Status: DC

## 2021-06-16 NOTE — Progress Notes (Signed)
CSW received e-mail from [place] of financial counseling who states the patient's pending Medicaid application was assigned to [employer] at [facility] DSS. ? ?Edwin Dada, MSW, LCSW ?Transitions of Care  Clinical Social Worker II ?[phone] ? ?

## 2021-06-16 NOTE — ED Provider Triage Note (Signed)
Emergency Medicine Provider Triage Evaluation Note ? ?Logan Nelson , a 56 y.o. male  was evaluated in triage.  Pt complains of persistent back pain.  Patient has had a prolonged hospitalization and decided to leave AMA today.  Per chart review it appears patient got frustrated being n.p.o. for MRIs with sedation.  Denies fever. ? ?Review of Systems  ?Positive: As above ?Negative: As above ? ?Physical Exam  ?BP (!) 123/95   Pulse (!) 126   Temp 98.9 ?F (37.2 ?C) (Oral)   Resp 18   SpO2 100%  ?Gen:   Awake, no distress   ?Resp:  Normal effort  ?MSK:   Moves extremities without difficulty  ?Other:   ? ?Medical Decision Making  ?Medically screening exam initiated at 9:51 PM.  Appropriate orders placed.  Logan Nelson was informed that the remainder of the evaluation will be completed by another provider, this initial triage assessment does not replace that evaluation, and the importance of remaining in the ED until their evaluation is complete. ? ?  ?Logan [place], PA-C ?06/16/21 2152 ? ?

## 2021-06-16 NOTE — Progress Notes (Signed)
Pharmacy Antibiotic Note ? ?Logan Nelson is a 56 y.o. male admitted on 05/10/2021 with MSSA bacteremia.  Pharmacy has been consulted for Unasyn dosing. ? ?WBC elevated at 17.6, afebrile.  SCr trending up.  ? ?Plan: ?Unasyn 3g q6h ?Trend WBC, temp, renal function  ?F/U infectious work-up and duration of treatment ? ?Height: 5\' 6"  (167.6 cm) ?Weight: 70 kg (154 lb 5.2 oz) ?IBW/kg (Calculated) : 63.8 ? ?Temp (24hrs), Avg:98.5 ?F (36.9 ?C), Min:98 ?F (36.7 ?C), Max:98.9 ?F (37.2 ?C) ? ?Recent Labs  ?Lab 06/11/21 ?08/11/21 06/12/21 ?0957 06/13/21 ?1414 06/14/21 ?1127 06/14/21 ?1655 06/16/21 ?0500  ?WBC  --   --  15.2*  --  13.1* 17.6*  ?CREATININE 0.44* 0.50* 0.57* 0.60*  --  0.64  ?  ?Estimated Creatinine Clearance: 90.8 mL/min (by C-G formula based on SCr of 0.64 mg/dL).   ? ?No Known Allergies ? ?Antimicrobials this admission: ?Cefepime 5/8>>5/9 ?Cefazolin 5/9>>5/10 ?Flagyl 5/9>>5/10 ?Unasyn 5/10 >> ? ?Dose adjustments this admission: ? ? ?Microbiology results: ?5/8 BCx: MSSA ?5/10 BCx: NGTD ? ? ?Thank you for allowing pharmacy to be a part of this patient?s care. ? ?7/8 ?06/16/2021 8:52 AM ? ?

## 2021-06-16 NOTE — Progress Notes (Addendum)
Physical Therapy Treatment ?Patient Details ?Name: Logan Nelson ?MRN: [id number] ?DOB: [birth year] ?Today's Date: 06/16/2021 ? ? ?History of Present Illness Pt is a 82 admitted 4/3 with quickly progressing B LE weakness.  Recently hospitalize with incarcerated abdominal hernia, was repaired, but pt left AMA before he was safe to mobilize,  MRI of brain, cervical/thoracic spine inconclusive.  Work up continues.  PMHx: anemia, arthritis, dyspnea, GERD, HTN, LE edema ? ?  ?PT Comments  ? ? Received pt sidelying in bed with legs hanging off side. Pt agreeable to PT treatment but fixated on going home and getting procedure over with. Pt transferred supine<>sitting EOB with min A for trunk control but able to transfer sit<>supine with supervision. Pt performed all transfers with RW and min guard. Pt progressing well with ambulation, demonstrating improvements in gait distance and speed. Acute PT to cont to follow.  ?   ?Recommendations for follow up therapy are one component of a multi-disciplinary discharge planning process, led by the attending physician.  Recommendations may be updated based on patient status, additional functional criteria and insurance authorization. ? ?Follow Up Recommendations ? Skilled nursing-short term rehab (<3 hours/day) ?  ?  ?Assistance Recommended at Discharge Frequent or constant Supervision/Assistance  ?Patient can return home with the following A little help with walking and/or transfers;A little help with bathing/dressing/bathroom;Assist for transportation;Help with stairs or ramp for entrance;Assistance with cooking/housework ?  ?Equipment Recommendations ? Rolling walker (2 wheels);Wheelchair (measurements PT);Wheelchair cushion (measurements PT);Other (comment)  ?  ?Recommendations for Other Services   ? ? ?  ?Precautions / Restrictions Precautions ?Precautions: Fall ?Restrictions ?Weight Bearing Restrictions: No  ?  ? ?Mobility ? Bed Mobility ?Overal bed mobility: Needs  Assistance ?Bed Mobility: Sit to Supine, Supine to Sit, Rolling ?Rolling: Independent ?  ?Supine to sit: Min assist ?Sit to supine: Supervision ?  ?General bed mobility comments: flat air matress using bedrails for assist ?Patient Response: Cooperative ? ?Transfers ?Overall transfer level: Needs assistance ?Equipment used: Rolling walker (2 wheels) ?Transfers: Sit to/from Stand ?Sit to Stand: Min guard ?  ?  ?  ?  ?  ?  ?  ? ?Ambulation/Gait ?Ambulation/Gait assistance: Min guard ?Gait Distance (Feet): 300 Feet ?Assistive device: Rolling walker (2 wheels) ?Gait Pattern/deviations: Step-through pattern, Decreased stride length, Decreased step length - right, Decreased step length - left, Trunk flexed, Narrow base of support ?Gait velocity: slightly decreased ?Gait velocity interpretation: 1.31 - 2.62 ft/sec, indicative of limited community ambulator ?  ?General Gait Details: Pt required multiple standing rest breaks due to fatigue. No LOB noted but pt distracted by what was going on in hallway and at nurses station. ? ? ?Stairs ?  ?  ?  ?  ?  ? ? ?Wheelchair Mobility ?  ? ?Modified Rankin (Stroke Patients Only) ?  ? ? ?  ?Balance Overall balance assessment: Needs assistance ?Sitting-balance support: Feet supported, Bilateral upper extremity supported ?Sitting balance-Leahy Scale: Fair ?Sitting balance - Comments: sitting EOB on air matress ?  ?Standing balance support: Bilateral upper extremity supported, During functional activity (RW) ?Standing balance-Leahy Scale: Poor ?Standing balance comment: pt able to maintain static standing balance with supervision but required min guard for dynamic standing balance. ?  ?  ?  ?  ?  ?  ?  ?  ?  ?  ?  ?  ? ?  ?Cognition Arousal/Alertness: Awake/alert ?Behavior During Therapy: [facility] for tasks assessed/performed ?Overall Cognitive Status: No family/caregiver present to determine baseline cognitive functioning ?Area of Impairment:  Safety/judgement, Awareness ?  ?  ?  ?  ?  ?  ?  ?   ?  ?  ?  ?  ?Safety/Judgement: Decreased awareness of safety ?  ?  ?General Comments: pt fixated on going home and getting procedure over with ?  ?  ? ?  ?Exercises   ? ?  ?General Comments   ?  ?  ? ?Pertinent Vitals/Pain Pain Assessment ?Pain Assessment: Faces ?Faces Pain Scale: Hurts a little bit ?Pain Location: low back (chronic) ?Pain Descriptors / Indicators: Guarding ?Pain Intervention(s): Limited activity within patient's tolerance, Repositioned, RN gave pain meds during session  ? ? ?Home Living   ?  ?  ?  ?  ?  ?  ?  ?  ?  ?   ?  ?Prior Function    ?  ?  ?   ? ?PT Goals (current goals can now be found in the care plan section) Acute Rehab PT Goals ?Patient Stated Goal: return to independence ?PT Goal Formulation: With patient ?Time For Goal Achievement: 06/23/21 ?Potential to Achieve Goals: Good ?Progress towards PT goals: Progressing toward goals ? ?  ?Frequency ? ? ? Min 3X/week ? ? ? ?  ?PT Plan Current plan remains appropriate  ? ? ?Co-evaluation   ?  ?  ?  ?  ? ?  ?AM-PAC PT "6 Clicks" Mobility   ?Outcome Measure ? Help needed turning from your back to your side while in a flat bed without using bedrails?: None ?Help needed moving from lying on your back to sitting on the side of a flat bed without using bedrails?: A Little ?Help needed moving to and from a bed to a chair (including a wheelchair)?: A Little ?Help needed standing up from a chair using your arms (e.g., wheelchair or bedside chair)?: A Little ?Help needed to walk in hospital room?: A Little ?Help needed climbing 3-5 steps with a railing? : A Lot ?6 Click Score: 18 ? ?  ?End of Session Equipment Utilized During Treatment: Gait belt ?Activity Tolerance: Patient tolerated treatment well ?Patient left: in bed;with call bell/phone within reach ?Nurse Communication: Mobility status ?PT Visit Diagnosis: Other abnormalities of gait and mobility (R26.89);Muscle weakness (generalized) (M62.81);Pain ?Pain - Right/Left:  (low back) ?Pain - part of  body:  (low back) ?  ? ? ?Time: [id number] ?PT Time Calculation (min) (ACUTE ONLY): 20 min ? ?Charges:  $Gait Training: 8-22 mins          ?          ? ?Becky Sax PT, DPT  ?Blenda Nicely ?06/16/2021, 8:53 AM ? ?

## 2021-06-16 NOTE — ED Triage Notes (Signed)
Pt reports leaving this facility earlier today d/t not being able to tolerate MRI.  Pt was to have sedation for MRI but did not want to be NPO prior to procedure.  Pt returns for weakness, back pain.  Pt tachycardic in triage.  ?

## 2021-06-16 NOTE — Progress Notes (Signed)
Patient standing in room stating that he wants to leave. Patient educated on health condition and need to stay by 2 RNs and by attending. Patient agrees to stay at this time. Refusing to lay back in the bed and other fall prevention measures at this time.  ?

## 2021-06-16 NOTE — Progress Notes (Signed)
?PROGRESS NOTE ? ? ? ?Bao Bazen  HDQ:[id number] DOB: 10-29-1965 DOA: 05/10/2021 ?PCP: [name], MD  ?56 y.o. male with past medical history of severe protein calorie malnutrition, hypertension, anasarca, abdominal wall hernia presented to the hospital with multiple falls and swelling. ?-Recently hospitalized for incarcerated abdominal hernia, had primary repair but left AMA before he could be placed in rehab ?-Admitted with severe debility, protein calorie malnutrition and failure to thrive ?-Hospital course complicated by sepsis, MSSA bacteremia ?- ID following.  Will be undergoing MRI of the spine and sacrum to rule out osteomyelitis. ?2D Echo also shows worsening EF.  ? ?Subjective: ?-Upset about being n.p.o. for multiple MRI scans with sedation, threatening to leave AMA ? ?Assessment and Plan: ? ?Sepsis secondary to MSSA bacteremia. Not present on admission. ?-Developed fever, leukocytosis, tachycardia and tachypnea on 5/8. ?-Blood cultures grew MSSA ?-2D echo noted drop in EF to 30%, no clear vegetation ?-ID consulting, needs TEE, unfortunately threatening to leave AMA now ?-MRIs of cervical and lumbar scans pending ?-Now on IV Unasyn, follow-up repeat cultures ? ?Acute systolic CHF, severe MR ?Patient was on aggressive diuresis here in the hospital. ?Echocardiogram 3/23 shows EF of 60-65% with moderate to severe MR due to flail leaflet.,  Repeat echo with EF 30, 35%, moderate MR ?-Not agreeable to additional cardiac work-up at this time, threatening to leave AMA today ?-Continue IV Lasix today ?  ?Severe protein calorie malnutrition. ?Hypoalbuminemia ?-Suspect chronic liver disease, severe hepatic steatosis noted on imaging, denies EtOH use ?-CT abdomen/pelvis earlier this admission, notable for anasarca,, mild ascites ?-[facility] consulted.  Recommend colonoscopy and EGD once he is ambulatory with improvement in nutritional status. ?Outpatient follow-up with GI recommended. ?  ?Right-sided  weakness. ?Deconditioning. ?Presents with multiple falls.  Weakness appears to be more on the right upper extremity. ?Neurology was consulted.  Unable to identify and pinpoint an etiology based on the work-up. Recommended outpatient follow-up and treatment of malnutrition. ?MRIs were done, C-spine with enhancement of the interspinous process/nonspecific , neurosurgery was consulted and they felt that there is no evidence of infection and no further work-up was recommended. ?B1 normal, B12 low normal, vitamin D low, vitamin A low.  All currently being replaced. ?ESR normal.  CRP mildly elevated but improving.  CK normal.  TSH normal. ?PT OT consulted recommended SNF ?-Outpatient EMG recommended for right upper extremity. ?  ?Pressure ulcer stage II, present on admission at left buttock. ?-Continue current wound care ?Wound is tunneling per wound care's note and continues to have purulent greenish discharge. ?  ?Recently repair of incarcerated ventral incisional hernia. ?CT abdomen unremarkable for any acute abnormality this admit.  Monitor for now. ?  ?Neck pain.  Spasticity ?-Supportive care, DC NSAIDs ?  ?HTN. ?Blood pressure stable. ?Continue current regimen. ?  ?Sinus tachycardia/SVT ?EKG shows sinus tachycardia. ?Intermittently appears to have some SVTs as well. ?-Add p.o. metoprolol ?  ?Iron deficiency anemia likely from nutritional deficiency. ?-So far 3 PRBC transfusion. ?GI was consulted.  No active bleeding seen. ?Treated with IV iron, subcutaneous B12 injection ?Continue oral iron supplements and B complex.,  Patient GI follow-up ?  ?Hypokalemia, hypocalcemia, hypophosphatemia, hypomagnesemia. ?Currently being replaced. ?Monitor. ? ? ?DVT prophylaxis: Add Lovenox ?Code Status: Full code ?Family Communication: No family at bedside ?Disposition Plan: To be determined, homeless ? ?Consultants:  ?Infectious disease, neurosurgery, GI, Neuro ? ?Procedures:  ? ?Antimicrobials:  ? ? ?Objective: ?Vitals:  ?  06/16/21 0405 06/16/21 0430 06/16/21 0811 06/16/21 1100  ?BP:  Marland Kitchen)  132/107 (!) 139/107 (!) 132/110  ?Pulse:  (!) 117 (!) 124 (!) 125  ?Resp: (!) 25 (!) 21 14 18   ?Temp:  98.7 ?F (37.1 ?C) 98.4 ?F (36.9 ?C) (!) 97.4 ?F (36.3 ?C)  ?TempSrc:  Oral Oral Oral  ?SpO2:  94% 93% 94%  ?Weight:      ?Height:      ? ? ?Intake/Output Summary (Last 24 hours) at 06/16/2021 1204 ?Last data filed at 06/15/2021 2358 ?Gross per 24 hour  ?Intake 680 ml  ?Output 760 ml  ?Net -80 ml  ? ?Filed Weights  ? 05/13/21 1600 06/09/21 0643 06/14/21 0500  ?Weight: 65 kg 69.9 kg 70 kg  ? ? ?Examination: ? ?General exam: Chronically ill thinly built male sitting up in bed, AAOx3, angry and irritable ?HEENT: No JVD ?CVS: S1-S2, regular rhythm ?Lungs: Decreased breath sounds the bases ?Abdomen: Soft, nontender, nondistended, bowel sounds present ?Extremities: Trace edema ?Skin: Deep sacral decubitus wound with dressing  ?Psychiatry: Irritable ? ? ? ?Data Reviewed:  ? ?CBC: ?Recent Labs  ?Lab 06/13/21 ?1414 06/14/21 ?1655 06/16/21 ?0500  ?WBC 15.2* 13.1* 17.6*  ?NEUTROABS 12.8* 10.5*  --   ?HGB 10.2* 9.2* 8.8*  ?HCT 32.0* 29.0* 27.4*  ?MCV 92.8 91.8 91.3  ?PLT 413* 351 338  ? ?Basic Metabolic Panel: ?Recent Labs  ?Lab 06/11/21 ?3845 06/12/21 ?0957 06/13/21 ?1414 06/14/21 ?1127 06/16/21 ?0500  ?NA 140 136 138 140 138  ?K 3.5 3.9 4.0 4.0 4.0  ?CL 106 101 102 103 107  ?CO2 28 26 29 29 26   ?GLUCOSE 81 106* 116* 125* 124*  ?BUN 13 17 23* 29* 33*  ?CREATININE 0.44* 0.50* 0.57* 0.60* 0.64  ?CALCIUM 8.2* 8.7* 8.8* 8.9 8.7*  ?MG 1.8 1.4* 1.8 1.9 1.9  ? ?GFR: ?Estimated Creatinine Clearance: 90.8 mL/min (by C-G formula based on SCr of 0.64 mg/dL). ?Liver Function Tests: ?Recent Labs  ?Lab 06/13/21 ?1414  ?AST 21  ?ALT 22  ?ALKPHOS 94  ?BILITOT 0.6  ?PROT 7.1  ?ALBUMIN 3.0*  ? ?No results for input(s): LIPASE, AMYLASE in the last 168 hours. ?No results for input(s): AMMONIA in the last 168 hours. ?Coagulation Profile: ?No results for input(s): INR, PROTIME in the  last 168 hours. ?Cardiac Enzymes: ?Recent Labs  ?Lab 06/10/21 ?3646  ?CKTOTAL 28*  ? ?BNP (last 3 results) ?No results for input(s): PROBNP in the last 8760 hours. ?HbA1C: ?No results for input(s): HGBA1C in the last 72 hours. ?CBG: ?No results for input(s): GLUCAP in the last 168 hours. ?Lipid Profile: ?No results for input(s): CHOL, HDL, LDLCALC, TRIG, CHOLHDL, LDLDIRECT in the last 72 hours. ?Thyroid Function Tests: ?No results for input(s): TSH, T4TOTAL, FREET4, T3FREE, THYROIDAB in the last 72 hours. ?Anemia Panel: ?No results for input(s): VITAMINB12, FOLATE, FERRITIN, TIBC, IRON, RETICCTPCT in the last 72 hours. ?Urine analysis: ?   ?Component Value Date/Time  ? [place] YELLOW 05/10/2021 1953  ? APPEARANCEUR CLEAR 05/10/2021 1953  ? LABSPEC 1.013 05/10/2021 1953  ? PHURINE 6.0 05/10/2021 1953  ? [place] NEGATIVE 05/10/2021 1953  ? [place] NEGATIVE 05/10/2021 1953  ? [place] NEGATIVE 05/10/2021 1953  ? [place] Stain NEGATIVE 05/10/2021 1953  ? [place] NEGATIVE 05/10/2021 1953  ? NITRITE NEGATIVE 05/10/2021 1953  ? LEUKOCYTESUR NEGATIVE 05/10/2021 1953  ? ?Sepsis Labs: ?@[email or web address] (procalcitonin:4,lacticidven:4) ? ?) ?Recent Results (from the past 240 hour(s))  ?Culture, blood (Routine X 2) w Reflex to ID Panel     Status: Abnormal (Preliminary result)  ? Collection Time: 06/14/21  4:45 PM  ? Specimen: BLOOD  ?  Result Value Ref Range Status  ? Specimen Description BLOOD LEFT ANTECUBITAL  Final  ? Special Requests   Final  ?  BOTTLES DRAWN AEROBIC AND ANAEROBIC Blood Culture results may not be optimal due to an inadequate volume of blood received in culture bottles  ? Culture  Setup Time   Final  ?  GRAM POSITIVE COCCI IN CLUSTERS ?IN BOTH AEROBIC AND ANAEROBIC BOTTLES ?CRITICAL VALUE NOTED.  VALUE IS CONSISTENT WITH PREVIOUSLY REPORTED AND CALLED VALUE. ?Performed at [facility], [place] [address] [place], [place] [address] ?  ? Culture STAPHYLOCOCCUS AUREUS (A)  Final  ? Report Status PENDING   Incomplete  ?Culture, blood (Routine X 2) w Reflex to ID Panel     Status: Abnormal (Preliminary result)  ? Collection Time: 06/14/21  4:55 PM  ? Specimen: BLOOD  ?Result Value Ref Range Status  ? Specimen Description BLOO

## 2021-06-16 NOTE — Progress Notes (Signed)
Patient insistent on leaving AMA. Patient educated on severity of his illness and that he will probably not survive if he's leaving AMA. Patient verbalizes understanding. Two RNs attempted to educate patient. Insistent on leaving. Patient has wheelchair and all belongings with him. IV removed. MD notified.  ?

## 2021-06-17 ENCOUNTER — Emergency Department (HOSPITAL_COMMUNITY): Payer: Medicaid Other

## 2021-06-17 ENCOUNTER — Encounter (HOSPITAL_COMMUNITY): Payer: Self-pay | Admitting: Radiology

## 2021-06-17 DIAGNOSIS — L89322 Pressure ulcer of left buttock, stage 2: Secondary | ICD-10-CM | POA: Diagnosis not present

## 2021-06-17 DIAGNOSIS — Z7401 Bed confinement status: Secondary | ICD-10-CM | POA: Diagnosis not present

## 2021-06-17 DIAGNOSIS — I480 Paroxysmal atrial fibrillation: Secondary | ICD-10-CM | POA: Diagnosis not present

## 2021-06-17 DIAGNOSIS — R531 Weakness: Secondary | ICD-10-CM | POA: Diagnosis not present

## 2021-06-17 DIAGNOSIS — I5181 Takotsubo syndrome: Secondary | ICD-10-CM | POA: Diagnosis not present

## 2021-06-17 DIAGNOSIS — I110 Hypertensive heart disease with heart failure: Secondary | ICD-10-CM | POA: Diagnosis not present

## 2021-06-17 DIAGNOSIS — L89153 Pressure ulcer of sacral region, stage 3: Secondary | ICD-10-CM | POA: Diagnosis not present

## 2021-06-17 DIAGNOSIS — K219 Gastro-esophageal reflux disease without esophagitis: Secondary | ICD-10-CM | POA: Diagnosis not present

## 2021-06-17 DIAGNOSIS — R7881 Bacteremia: Secondary | ICD-10-CM | POA: Diagnosis not present

## 2021-06-17 DIAGNOSIS — M546 Pain in thoracic spine: Secondary | ICD-10-CM | POA: Diagnosis not present

## 2021-06-17 DIAGNOSIS — I5021 Acute systolic (congestive) heart failure: Secondary | ICD-10-CM | POA: Diagnosis not present

## 2021-06-17 DIAGNOSIS — R54 Age-related physical debility: Secondary | ICD-10-CM | POA: Diagnosis not present

## 2021-06-17 DIAGNOSIS — L89302 Pressure ulcer of unspecified buttock, stage 2: Secondary | ICD-10-CM | POA: Diagnosis not present

## 2021-06-17 DIAGNOSIS — B9561 Methicillin susceptible Staphylococcus aureus infection as the cause of diseases classified elsewhere: Secondary | ICD-10-CM

## 2021-06-17 DIAGNOSIS — M009 Pyogenic arthritis, unspecified: Secondary | ICD-10-CM | POA: Diagnosis not present

## 2021-06-17 DIAGNOSIS — G061 Intraspinal abscess and granuloma: Secondary | ICD-10-CM | POA: Diagnosis not present

## 2021-06-17 DIAGNOSIS — E43 Unspecified severe protein-calorie malnutrition: Secondary | ICD-10-CM | POA: Diagnosis not present

## 2021-06-17 DIAGNOSIS — I6990 Unspecified sequelae of unspecified cerebrovascular disease: Secondary | ICD-10-CM | POA: Diagnosis not present

## 2021-06-17 DIAGNOSIS — J9601 Acute respiratory failure with hypoxia: Secondary | ICD-10-CM | POA: Diagnosis not present

## 2021-06-17 DIAGNOSIS — R9431 Abnormal electrocardiogram [ECG] [EKG]: Secondary | ICD-10-CM | POA: Diagnosis present

## 2021-06-17 DIAGNOSIS — D638 Anemia in other chronic diseases classified elsewhere: Secondary | ICD-10-CM | POA: Diagnosis not present

## 2021-06-17 DIAGNOSIS — G062 Extradural and subdural abscess, unspecified: Secondary | ICD-10-CM | POA: Diagnosis not present

## 2021-06-17 DIAGNOSIS — R609 Edema, unspecified: Secondary | ICD-10-CM | POA: Diagnosis not present

## 2021-06-17 DIAGNOSIS — M1990 Unspecified osteoarthritis, unspecified site: Secondary | ICD-10-CM | POA: Diagnosis not present

## 2021-06-17 DIAGNOSIS — M4622 Osteomyelitis of vertebra, cervical region: Secondary | ICD-10-CM | POA: Diagnosis not present

## 2021-06-17 DIAGNOSIS — W19XXXA Unspecified fall, initial encounter: Secondary | ICD-10-CM | POA: Diagnosis present

## 2021-06-17 DIAGNOSIS — M00011 Staphylococcal arthritis, right shoulder: Secondary | ICD-10-CM | POA: Diagnosis not present

## 2021-06-17 DIAGNOSIS — R079 Chest pain, unspecified: Secondary | ICD-10-CM | POA: Diagnosis not present

## 2021-06-17 DIAGNOSIS — M545 Low back pain, unspecified: Secondary | ICD-10-CM | POA: Diagnosis not present

## 2021-06-17 DIAGNOSIS — Z87891 Personal history of nicotine dependence: Secondary | ICD-10-CM | POA: Diagnosis not present

## 2021-06-17 DIAGNOSIS — Y9289 Other specified places as the place of occurrence of the external cause: Secondary | ICD-10-CM | POA: Diagnosis not present

## 2021-06-17 DIAGNOSIS — M549 Dorsalgia, unspecified: Secondary | ICD-10-CM | POA: Diagnosis not present

## 2021-06-17 DIAGNOSIS — I509 Heart failure, unspecified: Secondary | ICD-10-CM | POA: Diagnosis not present

## 2021-06-17 DIAGNOSIS — I4891 Unspecified atrial fibrillation: Secondary | ICD-10-CM | POA: Diagnosis not present

## 2021-06-17 DIAGNOSIS — R188 Other ascites: Secondary | ICD-10-CM | POA: Diagnosis not present

## 2021-06-17 DIAGNOSIS — M4628 Osteomyelitis of vertebra, sacral and sacrococcygeal region: Secondary | ICD-10-CM | POA: Diagnosis not present

## 2021-06-17 DIAGNOSIS — E1169 Type 2 diabetes mellitus with other specified complication: Secondary | ICD-10-CM | POA: Diagnosis not present

## 2021-06-17 DIAGNOSIS — Z8673 Personal history of transient ischemic attack (TIA), and cerebral infarction without residual deficits: Secondary | ICD-10-CM | POA: Diagnosis not present

## 2021-06-17 DIAGNOSIS — I10 Essential (primary) hypertension: Secondary | ICD-10-CM | POA: Diagnosis not present

## 2021-06-17 DIAGNOSIS — A4101 Sepsis due to Methicillin susceptible Staphylococcus aureus: Secondary | ICD-10-CM | POA: Diagnosis not present

## 2021-06-17 DIAGNOSIS — Z79899 Other long term (current) drug therapy: Secondary | ICD-10-CM | POA: Diagnosis not present

## 2021-06-17 DIAGNOSIS — R627 Adult failure to thrive: Secondary | ICD-10-CM | POA: Diagnosis not present

## 2021-06-17 DIAGNOSIS — I248 Other forms of acute ischemic heart disease: Secondary | ICD-10-CM | POA: Diagnosis not present

## 2021-06-17 DIAGNOSIS — I4892 Unspecified atrial flutter: Secondary | ICD-10-CM | POA: Diagnosis not present

## 2021-06-17 DIAGNOSIS — D649 Anemia, unspecified: Secondary | ICD-10-CM | POA: Diagnosis not present

## 2021-06-17 LAB — CULTURE, BLOOD (ROUTINE X 2): Special Requests: ADEQUATE

## 2021-06-17 LAB — URINALYSIS, ROUTINE W REFLEX MICROSCOPIC
Bacteria, UA: NONE SEEN
Bilirubin Urine: NEGATIVE
Glucose, UA: NEGATIVE mg/dL
Ketones, ur: NEGATIVE mg/dL
Leukocytes,Ua: NEGATIVE
Nitrite: NEGATIVE
Protein, ur: NEGATIVE mg/dL
Specific Gravity, Urine: 1.028 (ref 1.005–1.030)
pH: 5 (ref 5.0–8.0)

## 2021-06-17 LAB — HEPATIC FUNCTION PANEL
ALT: 17 U/L (ref 0–44)
AST: 24 U/L (ref 15–41)
Albumin: 2.8 g/dL — ABNORMAL LOW (ref 3.5–5.0)
Alkaline Phosphatase: 89 U/L (ref 38–126)
Bilirubin, Direct: 0.3 mg/dL — ABNORMAL HIGH (ref 0.0–0.2)
Indirect Bilirubin: 0.7 mg/dL (ref 0.3–0.9)
Total Bilirubin: 1 mg/dL (ref 0.3–1.2)
Total Protein: 6.8 g/dL (ref 6.5–8.1)

## 2021-06-17 LAB — LIPASE, BLOOD: Lipase: 19 U/L (ref 11–51)

## 2021-06-17 IMAGING — CR DG THORACIC SPINE 2V
2 series · 2 of 2 positions shown · non-contrast
Comparison: Thoracic MRI [DATE]

CLINICAL DATA: Back pain after fall

EXAM:
THORACIC SPINE 2 VIEWS

[t-spine ap]
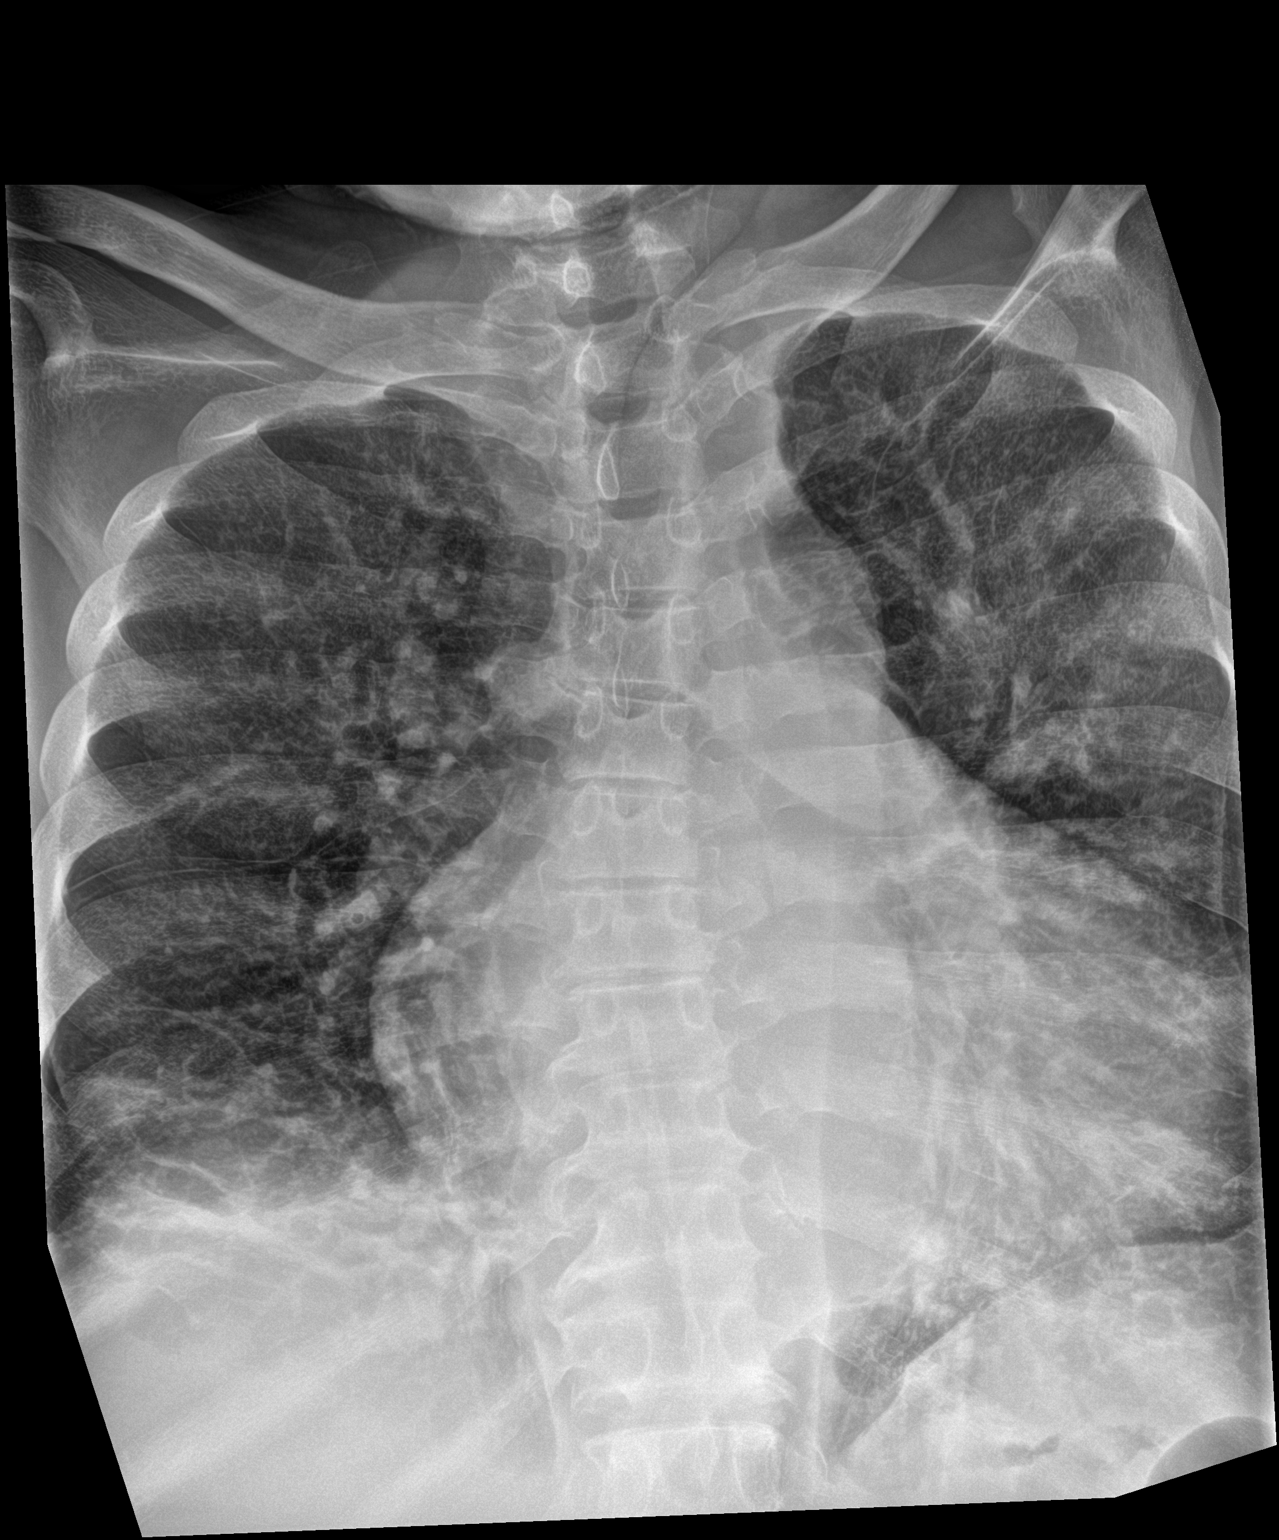

[t-spine lat]
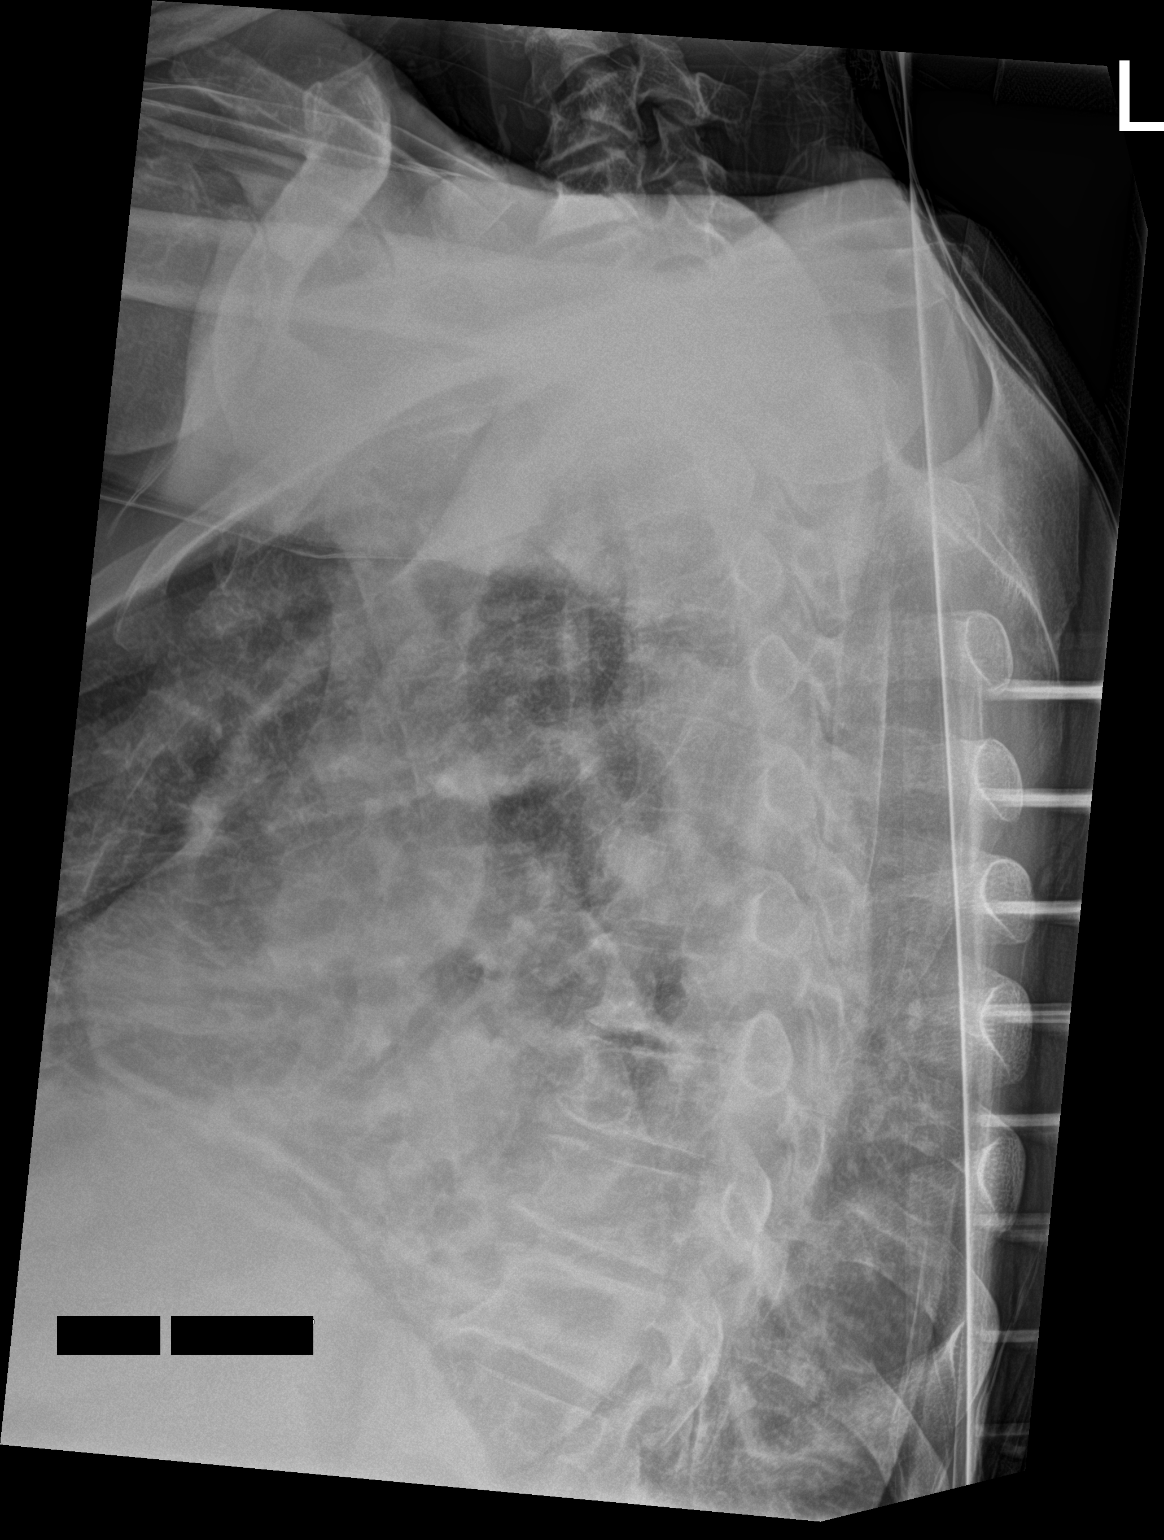

[2 of 2 positions shown; findings below may reference images not displayed]

FINDINGS: No acute fracture or traumatic malalignment seen along the thoracic
spine. Lower thoracic disc space narrowing and far-lateral spurring
with mild dextrocurvature. Generalized interstitial coarsening with
bands of atelectasis at the right base.
IMPRESSION: No acute finding in the thoracic spine.

## 2021-06-17 IMAGING — CR DG LUMBAR SPINE COMPLETE 4+V
5 series · 5 of 5 positions shown · non-contrast
Comparison: [DATE] abdominal CT

CLINICAL DATA: Back pain after fall

EXAM:
LUMBAR SPINE - COMPLETE 4+ VIEW

[l-spine ap]
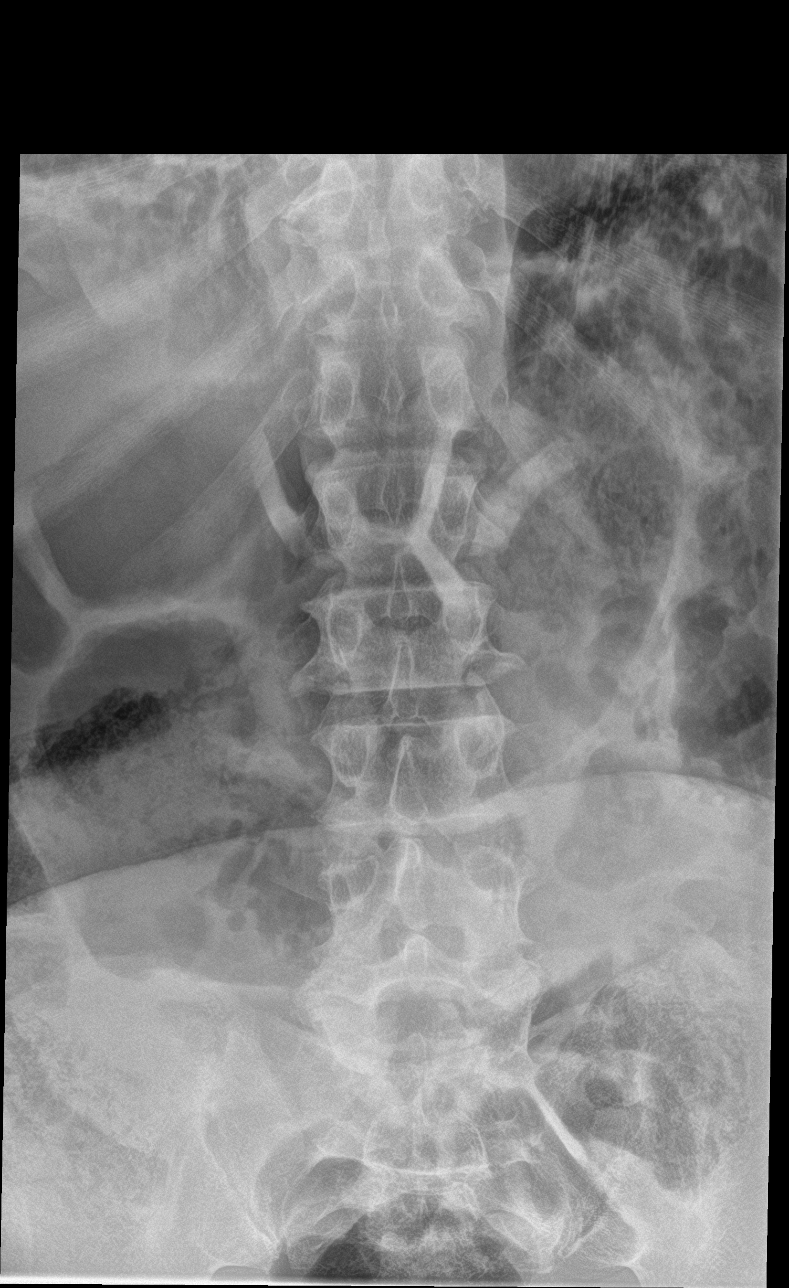

[l-spine obl (1 of 2)]
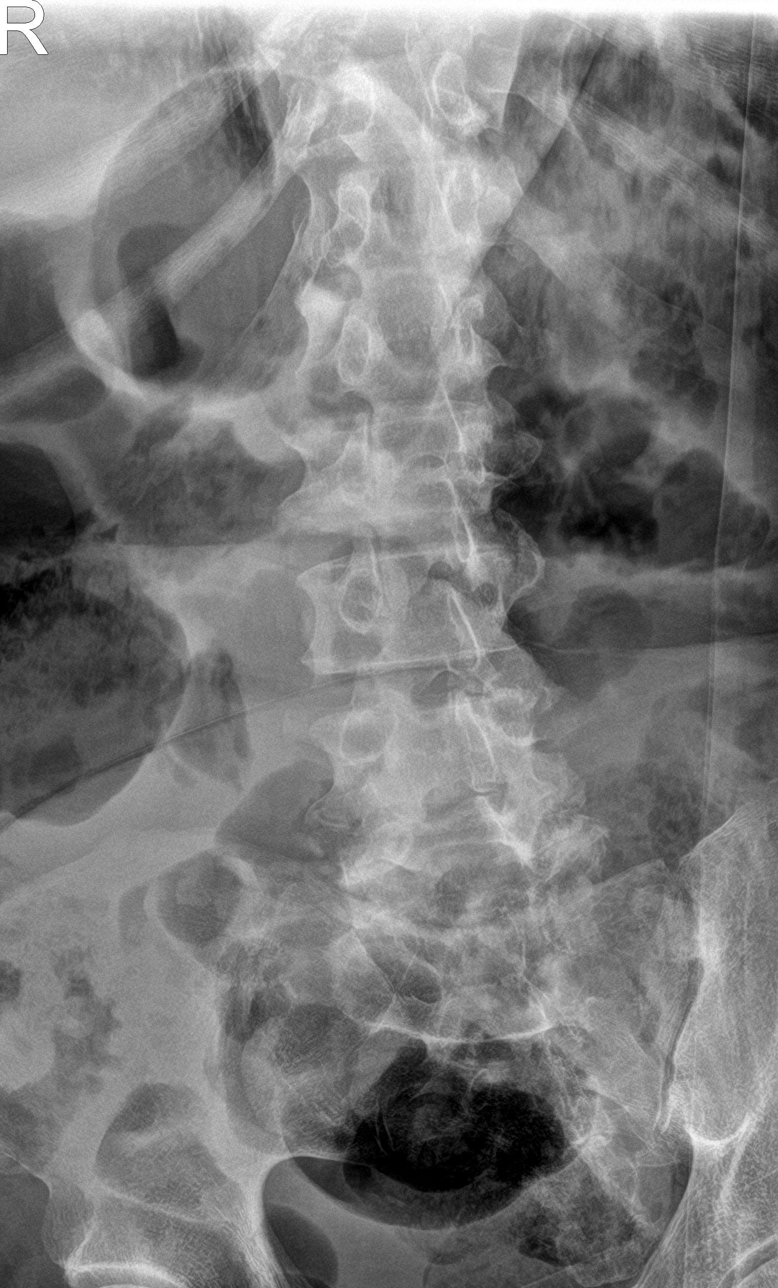

[l-spine obl (2 of 2)]
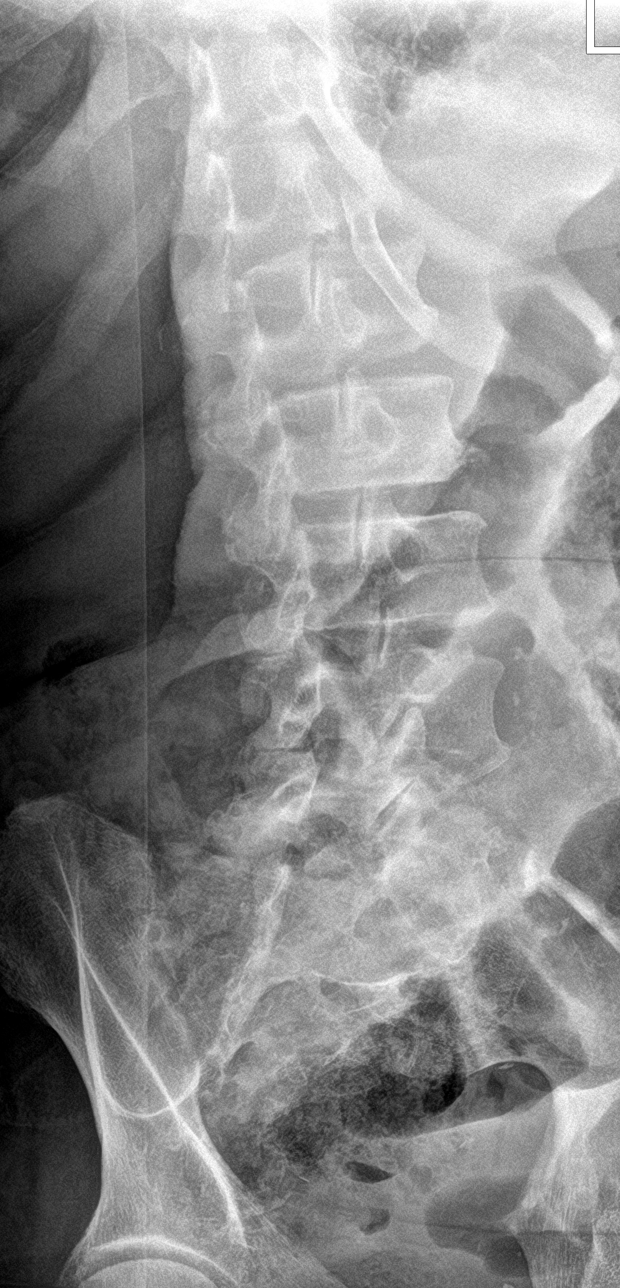

[l-spine lat]
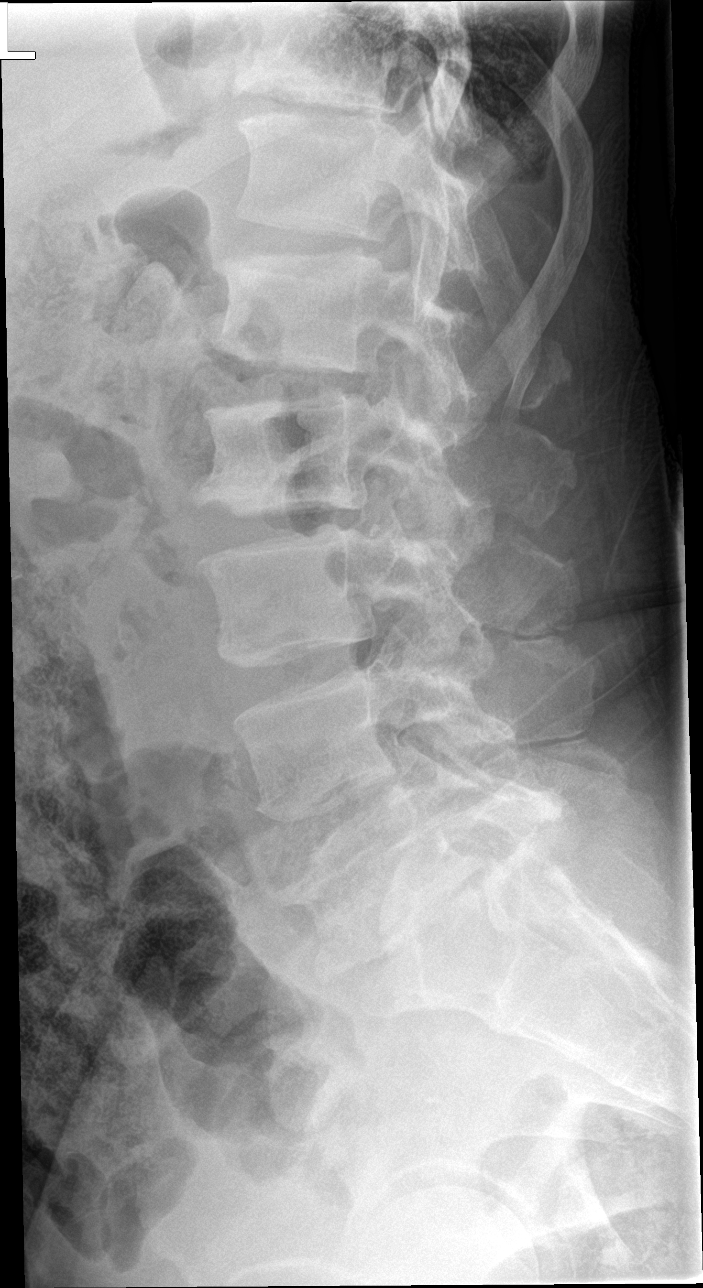

[l-spine spot]
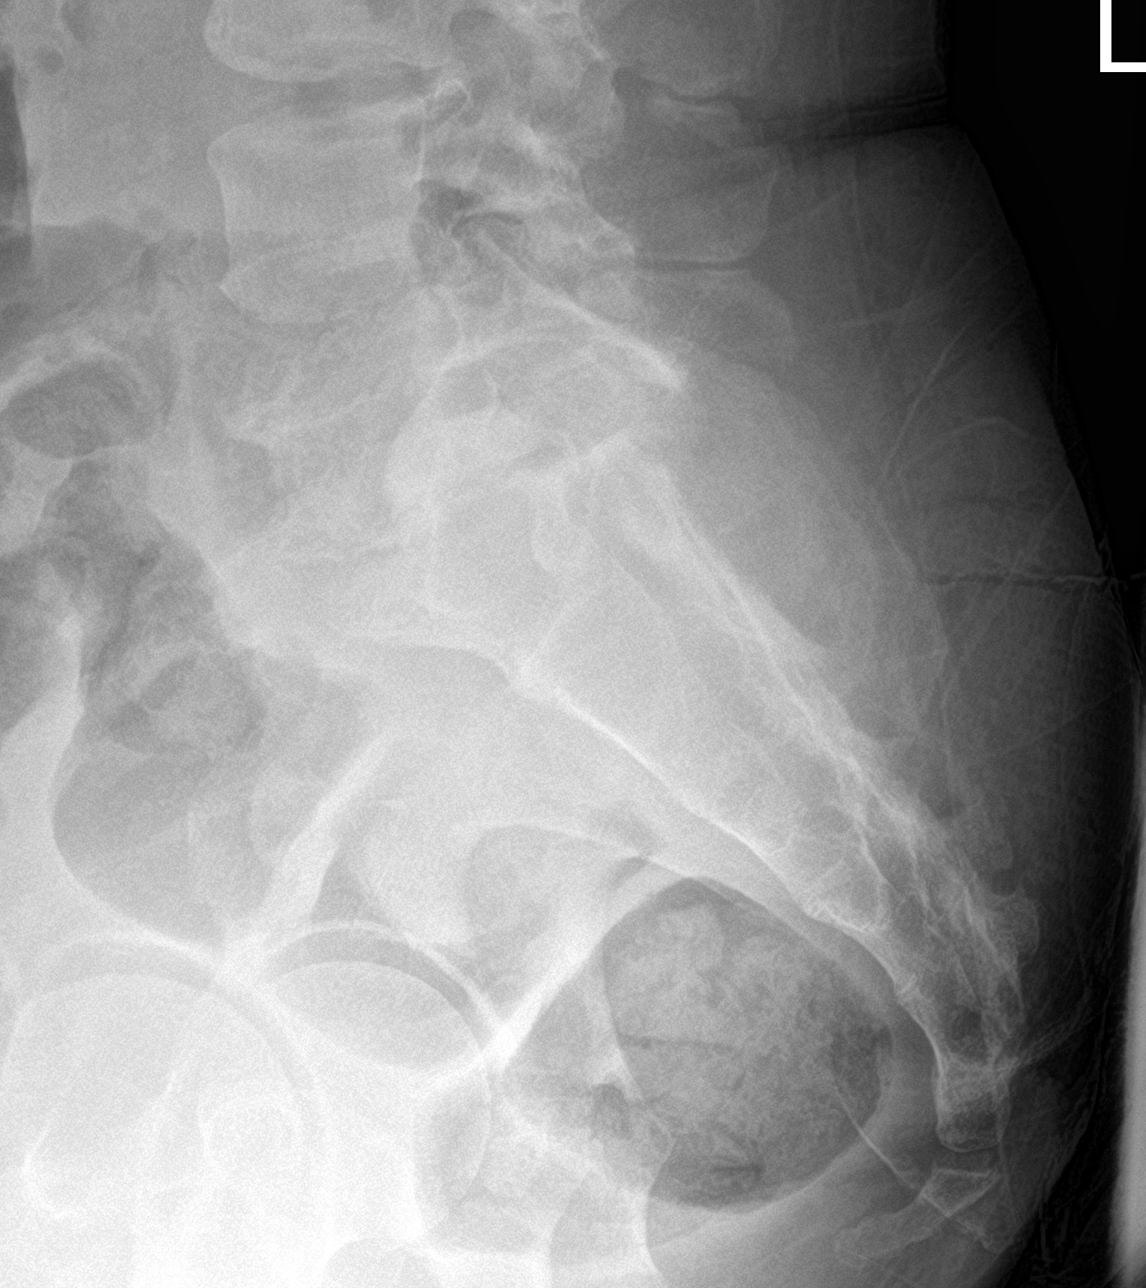

[5 of 5 positions shown; findings below may reference images not displayed]

FINDINGS: No acute fracture or traumatic malalignment. Decreased sensitivity
from overlapping bowel gas and de-mineralization. Lumbar spine
degeneration especially affecting the collapsed L5-S1 disc space
where there is ridging. Lower lumbar facet osteoarthritis with
spurring.
IMPRESSION: Lumbar spine degeneration without acute finding.

## 2021-06-17 IMAGING — CR DG CHEST 2V
2 series · 2 of 2 positions shown · non-contrast
Comparison: [DATE]

CLINICAL DATA: Fall with back pain

EXAM:
CHEST - 2 VIEW

[chest lat]
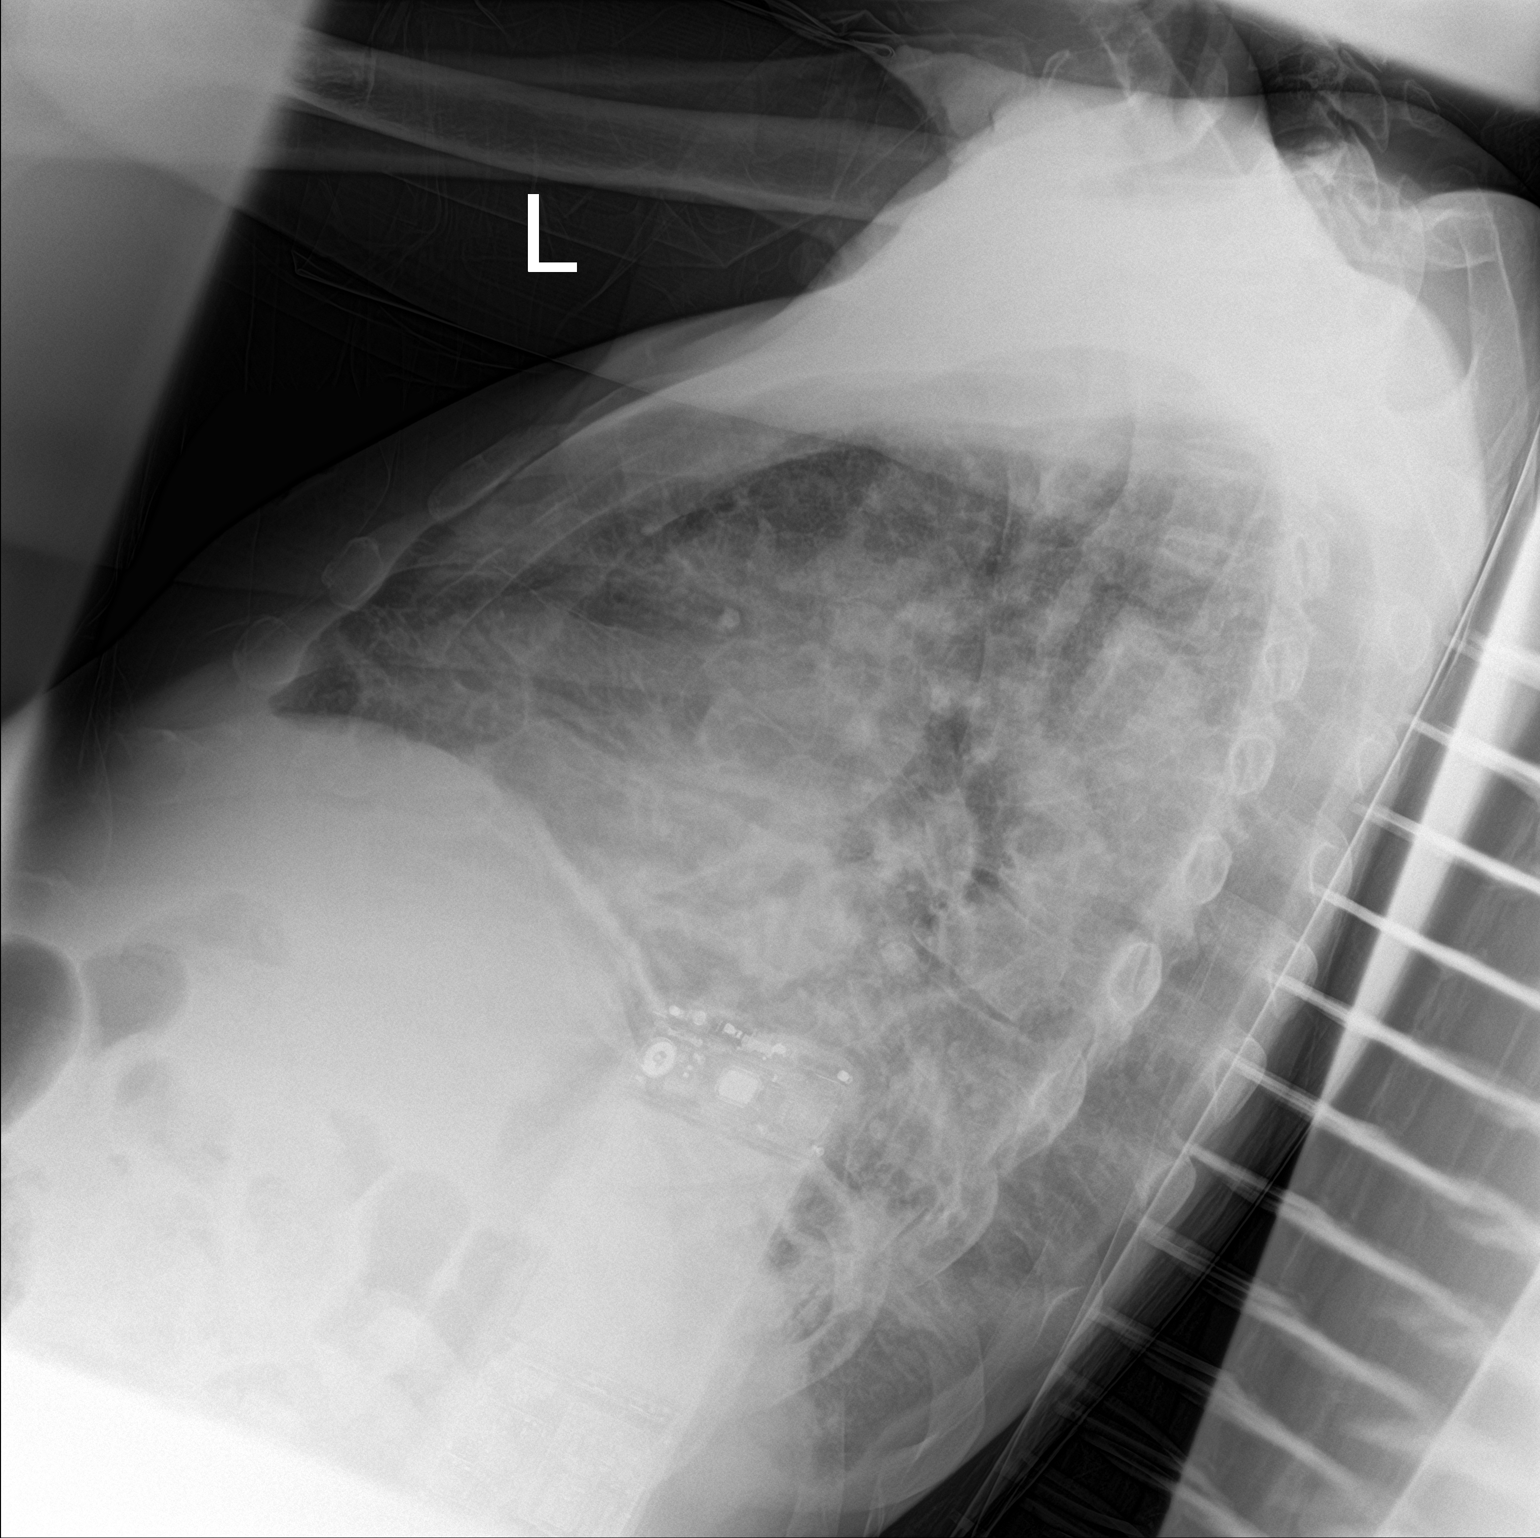

[chest ap]
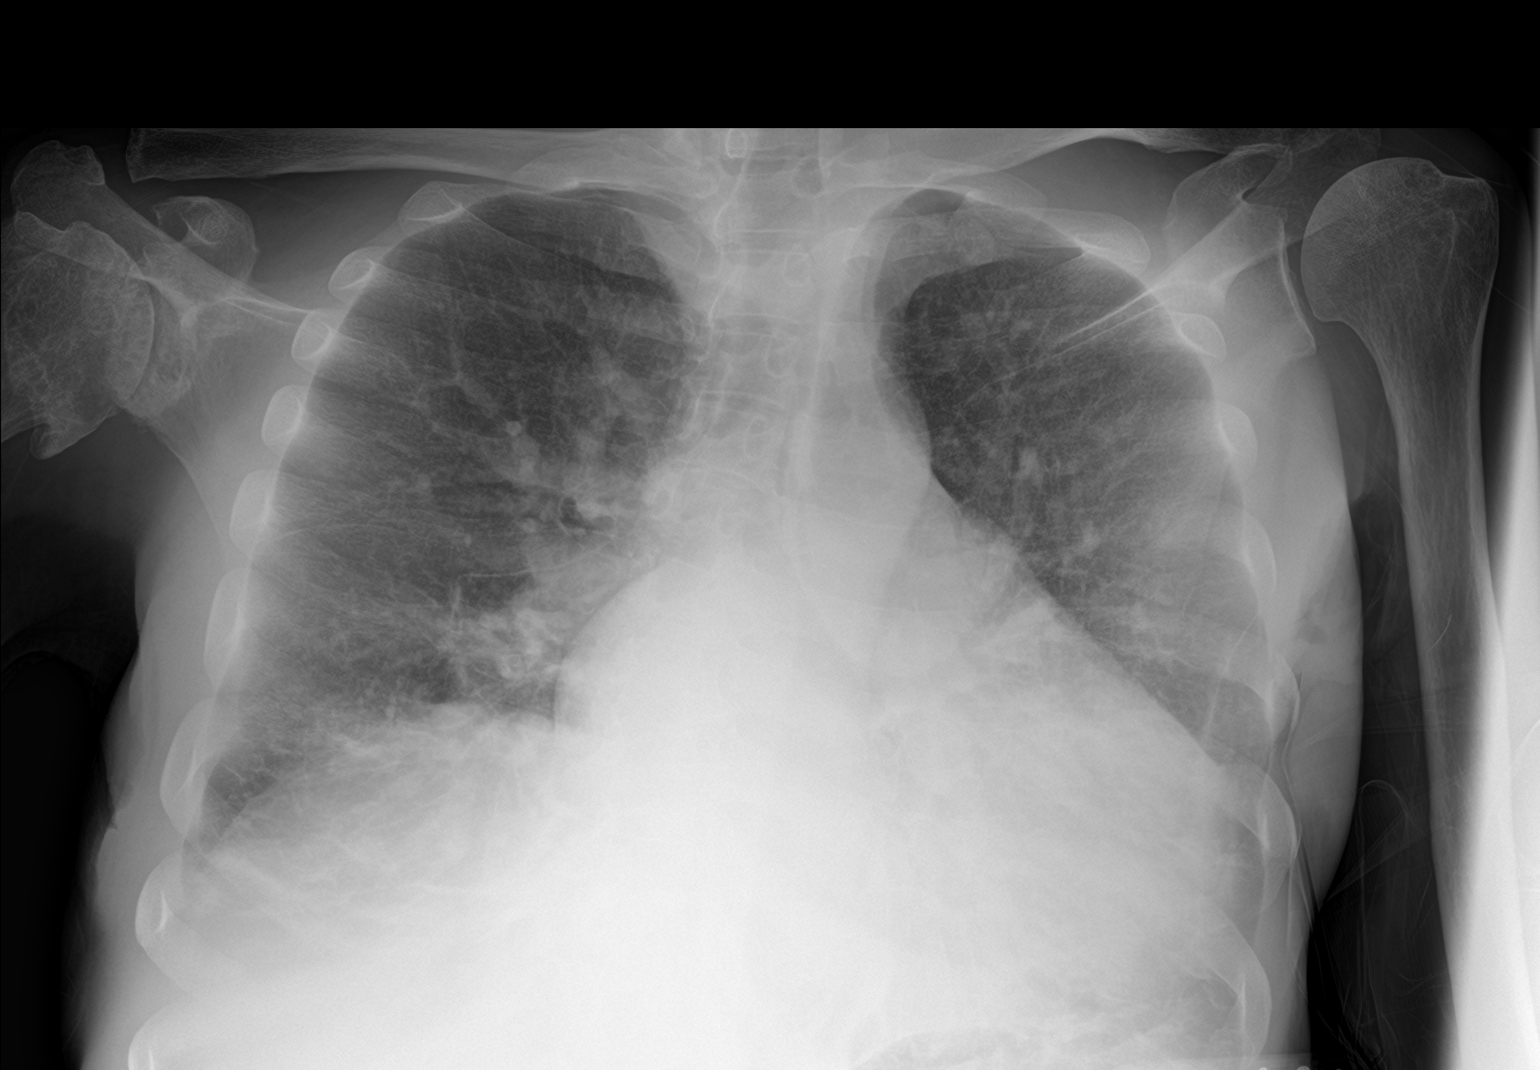

[2 of 2 positions shown; findings below may reference images not displayed]

FINDINGS: Cardiomegaly. Congested appearance of vessels with possible small
left pleural effusion. No pneumothorax. Improved retrocardiac
aeration.
IMPRESSION: CHF pattern.

Improved retrocardiac aeration.

## 2021-06-17 MED ORDER — METOPROLOL TARTRATE 25 MG PO TABS
25.0000 mg | ORAL_TABLET | Freq: Two times a day (BID) | ORAL | Status: DC
  Administered 2021-06-17 – 2021-06-29 (×25): 25 mg via ORAL
  Filled 2021-06-17 (×25): qty 1

## 2021-06-17 MED ORDER — DOCUSATE SODIUM 100 MG PO CAPS
100.0000 mg | ORAL_CAPSULE | Freq: Two times a day (BID) | ORAL | Status: DC
  Administered 2021-06-17 – 2021-06-25 (×13): 100 mg via ORAL
  Filled 2021-06-17 (×15): qty 1

## 2021-06-17 MED ORDER — AMPICILLIN-SULBACTAM SODIUM 3 (2-1) G IJ SOLR
3.0000 g | Freq: Once | INTRAMUSCULAR | Status: AC
  Administered 2021-06-17: 3 g via INTRAVENOUS
  Filled 2021-06-17: qty 8

## 2021-06-17 MED ORDER — POLYETHYLENE GLYCOL 3350 17 G PO PACK
17.0000 g | PACK | Freq: Every day | ORAL | Status: DC | PRN
  Filled 2021-06-17: qty 1

## 2021-06-17 MED ORDER — OXYCODONE HCL 5 MG PO TABS
5.0000 mg | ORAL_TABLET | ORAL | Status: DC | PRN
  Administered 2021-06-17 – 2021-06-20 (×9): 5 mg via ORAL
  Filled 2021-06-17 (×9): qty 1

## 2021-06-17 MED ORDER — FUROSEMIDE 10 MG/ML IJ SOLN
20.0000 mg | Freq: Every day | INTRAMUSCULAR | Status: DC
  Administered 2021-06-17 – 2021-06-19 (×3): 20 mg via INTRAVENOUS
  Filled 2021-06-17 (×3): qty 2

## 2021-06-17 MED ORDER — HYDRALAZINE HCL 20 MG/ML IJ SOLN: 5.0000 mg | INTRAMUSCULAR | Status: DC | PRN

## 2021-06-17 MED ORDER — BISACODYL 5 MG PO TBEC: 5.0000 mg | DELAYED_RELEASE_TABLET | Freq: Every day | ORAL | Status: DC | PRN

## 2021-06-17 MED ORDER — ACETAMINOPHEN 325 MG PO TABS
650.0000 mg | ORAL_TABLET | Freq: Four times a day (QID) | ORAL | Status: DC | PRN
  Administered 2021-06-17 – 2021-06-22 (×5): 650 mg via ORAL
  Filled 2021-06-17 (×5): qty 2

## 2021-06-17 MED ORDER — SODIUM CHLORIDE 0.9 % IV SOLN
3.0000 g | Freq: Four times a day (QID) | INTRAVENOUS | Status: AC
  Administered 2021-06-17 – 2021-06-23 (×26): 3 g via INTRAVENOUS
  Filled 2021-06-17 (×28): qty 8

## 2021-06-17 MED ORDER — ACETAMINOPHEN 650 MG RE SUPP: 650.0000 mg | Freq: Four times a day (QID) | RECTAL | Status: DC | PRN

## 2021-06-17 MED ORDER — METRONIDAZOLE 500 MG/100ML IV SOLN
500.0000 mg | Freq: Two times a day (BID) | INTRAVENOUS | Status: DC
  Administered 2021-06-17: 500 mg via INTRAVENOUS
  Filled 2021-06-17: qty 100

## 2021-06-17 MED ORDER — ONDANSETRON HCL 4 MG PO TABS: 4.0000 mg | ORAL_TABLET | Freq: Four times a day (QID) | ORAL | Status: DC | PRN

## 2021-06-17 MED ORDER — SODIUM CHLORIDE 0.9% FLUSH
3.0000 mL | Freq: Two times a day (BID) | INTRAVENOUS | Status: DC
  Administered 2021-06-17 – 2021-06-24 (×11): 3 mL via INTRAVENOUS

## 2021-06-17 MED ORDER — ALBUTEROL SULFATE (2.5 MG/3ML) 0.083% IN NEBU: 2.5000 mg | INHALATION_SOLUTION | RESPIRATORY_TRACT | Status: DC | PRN

## 2021-06-17 MED ORDER — ONDANSETRON HCL 4 MG/2ML IJ SOLN: 4.0000 mg | Freq: Four times a day (QID) | INTRAMUSCULAR | Status: DC | PRN

## 2021-06-17 MED ORDER — NICOTINE 14 MG/24HR TD PT24
14.0000 mg | MEDICATED_PATCH | Freq: Every day | TRANSDERMAL | Status: DC | PRN
  Administered 2021-06-20: 14 mg via TRANSDERMAL
  Filled 2021-06-17: qty 1

## 2021-06-17 NOTE — Progress Notes (Signed)
Pharmacy Antibiotic Note ? ?Logan Nelson is a 56 y.o. male admitted on 06/16/2021 with MSSA bacteremia.  Pharmacy has been consulted for Unasyn dosing. ? ?WBC elevated at 14.5, afebrile.  SCr trending up.  ? ?Plan: ?Unasyn 3g q6h ?Trend WBC, temp, renal function  ?F/U infectious work-up and duration of treatment ?  ? ?Temp (24hrs), Avg:98.9 ?F (37.2 ?C), Min:98.6 ?F (37 ?C), Max:99.1 ?F (37.3 ?C) ? ?Recent Labs  ?Lab 06/12/21 ?0957 06/13/21 ?1414 06/14/21 ?1127 06/14/21 ?1655 06/16/21 ?0500 06/16/21 ?2159  ?WBC  --  15.2*  --  13.1* 17.6* 14.5*  ?CREATININE 0.50* 0.57* 0.60*  --  0.64 0.78  ? ?  ?Estimated Creatinine Clearance: 90.8 mL/min (by C-G formula based on SCr of 0.78 mg/dL).   ? ?No Known Allergies ? ?Antimicrobials this admission: ?Unasyn 5/11 ? ?Dose adjustments this admission: ? ? ?Microbiology results: ?5/8 BCx: MSSA ?5/10 BCx: NGTD ? ? ?Thank you for allowing pharmacy to be a part of this patient?s care. ? ?Doristine Counter ?06/17/2021 12:20 PM ? ?

## 2021-06-17 NOTE — H&P (Signed)
?History and Physical  ? ? ?Patient: Logan Nelson [id number] DOB: 08-11-1965 ?DOA: 06/16/2021 ?DOS: the patient was seen and examined on 06/17/2021 ?PCP: [name], MD  ?Patient coming from: Home; NOK: [name], [phone] ? ? ?Chief Complaint: fall ? ?HPI: Logan Nelson is a 56 y.o. male with medical history significant of HTN, anasarca, and recent hospitalization for incarcerated abdominal hernia with primary repair but left AMA before he could be placed in rehab.  He was subsequently admitted with severe debility, malnutrition, and FTT from 4/3 to yesterday.  During the hospitalization, he developed MSSA bacteremia and sepsis and ID was following with plan for MRI of the spine and sacrum to r/o osteo.  He was also noted to have worsening EF.  He was upset about being NPO for multiple MRI scans with sedation and left AMA.  He returned within a few hours due to weakness and back pain.  He reports that he walked out to the bus stop and took a misstep and fell.  He "didn't even make it onto the bus!".  He now recognizes the importance of staying in the hospital and promises not to leave AMA again. ? ? ? ?ER Course:  [place], per Dr. Linda Hedges: ? ?sighned out [facility] 06/16/21 - was being treated for bacteremia on Unasyn and flagyl. Fell at bus-stop and had increased back pain. Presented to ED for readmission. No documented injury from fall. Abx restarted. ? ? ? ? ?Review of Systems: As mentioned in the history of present illness. All other systems reviewed and are negative. ?Past Medical History:  ?Diagnosis Date  ? Anemia   ? Arthritis   ? Bradycardia   ? Dyspnea   ? Falling episodes   ? GERD (gastroesophageal reflux disease)   ? Hernia of abdominal wall   ? Hypertension   ? Lower extremity edema   ? ?Past Surgical History:  ?Procedure Laterality Date  ? ABDOMINAL SURGERY    ? INGUINAL HERNIA REPAIR Right 04/30/2021  ? Procedure: HERNIA REPAIR INGUINAL WITH MESH;  Surgeon:  Stechschulte, Nickola Major, MD;  Location: [place];  Service: General;  Laterality: Right;  ? LYSIS OF ADHESION  04/30/2021  ? Procedure: LYSIS OF ADHESION;  Surgeon: Felicie Morn, MD;  Location: [facility];  Service: General;;  ? RADIOLOGY WITH ANESTHESIA N/A 06/16/2021  ? Procedure: MRI CERVICAL THORACIC LUMBER AND SACRUM;  Surgeon: Radiologist, Medication, MD;  Location: [place];  Service: Radiology;  Laterality: N/A;  ? UMBILICAL HERNIA REPAIR N/A 04/30/2021  ? Procedure: OPEN UMBILICAL AND EPIGASTRIC HERNIA REPAIR;  Surgeon: Felicie Morn, MD;  Location: [place];  Service: General;  Laterality: N/A;  ? ?Social History:  reports that he quit smoking about 13 months ago. His smoking use included cigarettes. He has never used smokeless tobacco. He reports that he does not currently use alcohol. He reports that he does not currently use drugs. ? ?No Known Allergies ? ?No family history on file. ? ?Prior to Admission medications   ?Medication Sig Start Date End Date Taking? Authorizing Provider  ?feeding supplement (ENSURE ENLIVE / ENSURE PLUS) LIQD Take 237 mLs by mouth 3 (three) times daily between meals. ?Patient not taking: Reported on 04/23/2021 04/14/21   Lacinda Axon, MD  ?ondansetron (ZOFRAN) 4 MG tablet Take 1 tablet (4 mg total) by mouth every 8 (eight) hours as needed for nausea or vomiting. ?Patient not taking: Reported on 06/17/2021 04/14/21 04/14/22  Lacinda Axon, MD  ? ? ?  Physical Exam: ?Vitals:  ? 06/17/21 1400 06/17/21 1500 06/17/21 1538 06/17/21 1629  ?BP: (!) 117/94 (!) 116/94  (!) 122/94  ?Pulse: 88 100  99  ?Resp: 19 (!) 23  18  ?Temp:   98.9 ?F (37.2 ?C) 98.3 ?F (36.8 ?C)  ?TempSrc:   Oral Oral  ?SpO2: 95% 91%  95%  ? ?General:  Appears calm and comfortable and is in NAD ?Eyes:   EOMI, normal lids, iris ?ENT:  grossly normal hearing, lips & tongue, mmm ?Neck:  no LAD, masses or thyromegaly ?Cardiovascular:  RR with mild tachycardia, no m/r/g. No LE edema.  ?Respiratory:   CTA bilaterally with  no wheezes/rales/rhonchi.  Normal respiratory effort. ?Abdomen:  soft, NT, ND ?Skin:  no rash or induration seen on limited exam ?Musculoskeletal:  grossly normal tone BUE/BLE, good ROM, no bony abnormality ?Psychiatric:  blunted mood and affect, speech fluent and appropriate, AOx3 ?Neurologic:  CN 2-12 grossly intact, moves all extremities in coordinated fashion ? ? ?Radiological Exams on Admission: ?Independently reviewed - see discussion in A/P where applicable ? ?DG Chest 2 View ? ?Result Date: 06/17/2021 ?CLINICAL DATA:  Fall with back pain EXAM: CHEST - 2 VIEW COMPARISON:  06/14/2021 FINDINGS: Cardiomegaly. Congested appearance of vessels with possible small left pleural effusion. No pneumothorax. Improved retrocardiac aeration. IMPRESSION: CHF pattern. Improved retrocardiac aeration. Electronically Signed   By: Jorje Guild M.D.   On: 06/17/2021 05:19  ? ?DG Thoracic Spine 2 View ? ?Result Date: 06/17/2021 ?CLINICAL DATA:  Back pain after fall EXAM: THORACIC SPINE 2 VIEWS COMPARISON:  Thoracic MRI 05/11/2021 FINDINGS: No acute fracture or traumatic malalignment seen along the thoracic spine. Lower thoracic disc space narrowing and far-lateral spurring with mild dextrocurvature. Generalized interstitial coarsening with bands of atelectasis at the right base. IMPRESSION: No acute finding in the thoracic spine. Electronically Signed   By: Jorje Guild M.D.   On: 06/17/2021 05:22  ? ?DG Lumbar Spine Complete ? ?Result Date: 06/17/2021 ?CLINICAL DATA:  Back pain after fall EXAM: LUMBAR SPINE - COMPLETE 4+ VIEW COMPARISON:  06/01/2021 abdominal CT FINDINGS: No acute fracture or traumatic malalignment. Decreased sensitivity from overlapping bowel gas and de-mineralization. Lumbar spine degeneration especially affecting the collapsed L5-S1 disc space where there is ridging. Lower lumbar facet osteoarthritis with spurring. IMPRESSION: Lumbar spine degeneration without acute finding. Electronically Signed   By:  Jorje Guild M.D.   On: 06/17/2021 05:21   ? ?EKG: Independently reviewed.  Sinus tachycardia with rate 103; prolonged QTc 698; LAFB; nonspecific ST changes with no evidence of acute ischemia ? ? ?Labs on Admission: I have personally reviewed the available labs and imaging studies at the time of the admission. ? ?Pertinent labs:   ? ?BUN 40 ?Albumin 2.8 ?Procalcitonin yesterday AM 5.33 ?WBC 14.5 ?Hgb 9.2 ? ? ?Assessment and Plan: ?Principal Problem: ?  MSSA bacteremia ?Active Problems: ?  Acute systolic (congestive) heart failure ([place]) ?  Prolonged QT interval ?  Protein-calorie malnutrition, severe ?  Generalized weakness ?  Pressure injury of buttock, stage 2 ([place]) ?  Hypertension ?  Anemia of chronic disease ?  ?MSSA bacteremia ?-Developed fever, leukocytosis, tachycardia and tachypnea on 5/8, during his last hospitalization. ?-Blood cultures grew MSSA ?-2D echo noted drop in EF to 30%, no clear vegetation ?-ID consulted, needs TEE; Dr. West Bali is aware of repeat hospitalization and will see him ?-Unfortunately, he left AMA prior to evaluation or completion of treatment ?-He was gone a few hours and is now back for admission  again ?-MRIs of cervical, thoracic, lumbar, and sacral scans pending - will need to occur under general anesthesia ?-resume IV Unasyn, Flagyl ?-Follow-up repeat cultures ?-Will admit to telemetry, anticipate [facility] hospitalization if he does not leave AMA again ? ?Acute systolic CHF, severe MR ?Patient was on aggressive diuresis during prior hospitalization ?-Echocardiogram 3/23 shows EF of 60-65% with moderate to severe MR due to flail leaflet.,  Repeat echo with EF 30, 35%, moderate MR during last hospitalization ?-Continue IV Lasix for now ?-Previously refused further cardiac evaluation, will see if he continues to remain hospitalized to decide how to proceed ? ?Prolonged QTc ?-EKG appears more abnormal than prior with marked prolonged QTc ?-Will attempt to avoid QT-prolonging  medications such as PPI, nausea meds, SSRIs ?-Repeat EKG in AM  ?  ?Severe protein calorie malnutrition ?-Suspect chronic liver disease; severe hepatic steatosis noted on imaging ?-Denies EtOH use ?-CT abdomen/pelvis on prior

## 2021-06-17 NOTE — ED Provider Notes (Signed)
?[facility] ?Provider Note ? ? ?CSN: [id number] ?Arrival date & time: 06/16/21  2110 ? ?  ? ?History ? ?Chief Complaint  ?Patient presents with  ? Back pain  ? ? ?Logan Nelson is a 56 y.o. male. ? ?The history is provided by the patient and medical records.  ?Logan Nelson is a 56 y.o. male who presents to the Emergency Department complaining of back pain. He has an extensive medical hx and left the hospital AMA yesterday.  He was hospitalized with MSSA bacteremia and was planning to have MRI under sedation to rule out discitis and he stated that it was taking too long for the study and he wanted to teat so he left.  He was at the bus stop when he mis-judged a step and fell, striking his head.  No LOC.  Since falling he states his chronic back pain is worse.  Pain is throughout the entire central back.  His back pain feels like a worsening of his chronic pain but has new associated epigastric discomfort.   ? ?No fever, vomiting, diarrhea. No incontinence.  ?  ? ?Home Medications ?Prior to Admission medications   ?Medication Sig Start Date End Date Taking? Authorizing Provider  ?feeding supplement (ENSURE ENLIVE / ENSURE PLUS) LIQD Take 237 mLs by mouth 3 (three) times daily between meals. ?Patient not taking: Reported on 04/23/2021 04/14/21   Lacinda Axon, MD  ?ondansetron (ZOFRAN) 4 MG tablet Take 1 tablet (4 mg total) by mouth every 8 (eight) hours as needed for nausea or vomiting. ?Patient not taking: Reported on 06/17/2021 04/14/21 04/14/22  Lacinda Axon, MD  ?   ? ?Allergies    ?Patient has no known allergies.   ? ?Review of Systems   ?Review of Systems  ?All other systems reviewed and are negative. ? ?Physical Exam ?Updated Vital Signs ?BP (!) 134/92   Pulse (!) 114   Temp 98.6 ?F (37 ?C) (Oral)   Resp 18   SpO2 100%  ?Physical Exam ?Vitals and nursing note reviewed.  ?Constitutional:   ?   Appearance: He is well-developed.  ?HENT:  ?   Head:  Normocephalic and atraumatic.  ?Cardiovascular:  ?   Rate and Rhythm: Regular rhythm. Tachycardia present.  ?Pulmonary:  ?   Effort: Pulmonary effort is normal. No respiratory distress.  ?Abdominal:  ?   Palpations: Abdomen is soft.  ?   Tenderness: There is no abdominal tenderness. There is no guarding or rebound.  ?Musculoskeletal:     ?   General: No tenderness.  ?   Comments: 2+ DP pulses bilaterally.  Pitting edema to BLE as well as lower abdominal wall.  TTP over mid/upper thoracic back.   ?Skin: ?   General: Skin is warm and dry.  ?Neurological:  ?   Mental Status: He is alert and oriented to person, place, and time.  ?   Comments: 3-4/5 strength in RUE, 5/5 strength in LUE.  4+/5 strength RLE, 4/5 strength in LLE.   ?Psychiatric:     ?   Behavior: Behavior normal.  ? ? ?ED Results / Procedures / Treatments   ?Labs ?(all labs ordered are listed, but only abnormal results are displayed) ?Labs Reviewed  ?CBC WITH DIFFERENTIAL/PLATELET - Abnormal; Notable for the following components:  ?    Result Value  ? WBC 14.5 (*)   ? RBC 3.16 (*)   ? Hemoglobin 9.2 (*)   ? HCT 29.0 (*)   ?  RDW 16.2 (*)   ? Neutro Abs 10.8 (*)   ? Monocytes Absolute 1.6 (*)   ? Abs Immature Granulocytes 0.12 (*)   ? All other components within normal limits  ?BASIC METABOLIC PANEL - Abnormal; Notable for the following components:  ? BUN 40 (*)   ? All other components within normal limits  ?HEPATIC FUNCTION PANEL - Abnormal; Notable for the following components:  ? Albumin 2.8 (*)   ? Bilirubin, Direct 0.3 (*)   ? All other components within normal limits  ?LIPASE, BLOOD  ?URINALYSIS, ROUTINE W REFLEX MICROSCOPIC  ? ? ?EKG ?None ? ?Radiology ?DG Chest 2 View ? ?Result Date: 06/17/2021 ?CLINICAL DATA:  Fall with back pain EXAM: CHEST - 2 VIEW COMPARISON:  06/14/2021 FINDINGS: Cardiomegaly. Congested appearance of vessels with possible small left pleural effusion. No pneumothorax. Improved retrocardiac aeration. IMPRESSION: CHF pattern.  Improved retrocardiac aeration. Electronically Signed   By: Jorje Guild M.D.   On: 06/17/2021 05:19  ? ?DG Thoracic Spine 2 View ? ?Result Date: 06/17/2021 ?CLINICAL DATA:  Back pain after fall EXAM: THORACIC SPINE 2 VIEWS COMPARISON:  Thoracic MRI 05/11/2021 FINDINGS: No acute fracture or traumatic malalignment seen along the thoracic spine. Lower thoracic disc space narrowing and far-lateral spurring with mild dextrocurvature. Generalized interstitial coarsening with bands of atelectasis at the right base. IMPRESSION: No acute finding in the thoracic spine. Electronically Signed   By: Jorje Guild M.D.   On: 06/17/2021 05:22  ? ?DG Lumbar Spine Complete ? ?Result Date: 06/17/2021 ?CLINICAL DATA:  Back pain after fall EXAM: LUMBAR SPINE - COMPLETE 4+ VIEW COMPARISON:  06/01/2021 abdominal CT FINDINGS: No acute fracture or traumatic malalignment. Decreased sensitivity from overlapping bowel gas and de-mineralization. Lumbar spine degeneration especially affecting the collapsed L5-S1 disc space where there is ridging. Lower lumbar facet osteoarthritis with spurring. IMPRESSION: Lumbar spine degeneration without acute finding. Electronically Signed   By: Jorje Guild M.D.   On: 06/17/2021 05:21  ? ?ECHOCARDIOGRAM COMPLETE ? ?Result Date: 06/15/2021 ?   ECHOCARDIOGRAM REPORT   Patient Name:   Logan Nelson Date of Exam: 06/15/2021 Medical Rec #:  [id number]            Height:       66.0 in Accession #:    [id number]           Weight:       154.3 lb Date of Birth:  09-02-1965             BSA:          1.791 m? Patient Age:    16 years             BP:           115/91 mmHg Patient Gender: M                    HR:           103 bpm. Exam Location:  Inpatient Procedure: 2D Echo, Cardiac Doppler and Color Doppler Indications:    Bacteremia  History:        Patient has prior history of Echocardiogram examinations. Risk                 Factors:Hypertension and Former Smoker. GERD.  Sonographer:    Clayton Lefort RDCS (AE)  Referring Phys: [id number] [place]  1. There is a mobile filamentous structure in the RA. 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 No valvular lesions  are present to suggest endocarditis. Of note, this structure was present on the prior echo from March 2023.  2. LV myocardium is hyper trabeculated. Consider contrast echo or MRI to exclude noncompaction. Left ventricular ejection fraction, by estimation, is 30 to 35%. The left ventricle has moderately decreased function. The left ventricle demonstrates global  hypokinesis. Indeterminate diastolic filling due to E-A fusion.  3. Right ventricular systolic function is low normal. The right ventricular size is normal. There is normal pulmonary artery systolic pressure. The estimated right ventricular systolic pressure is [id number] mmHg.  4. Left atrial size was severely dilated.  5. Mild to moderate mitral regurgitation is present on this study. The prior study demonstrated possible moderate to severe MR. Would recommend TEE for clarification. The mitral valve is grossly normal. Mild to moderate mitral valve regurgitation. No evidence of mitral stenosis.  6. The aortic valve is tricuspid. There is mild calcification of the aortic valve. Aortic valve regurgitation is not visualized. Aortic valve sclerosis is present, with no evidence of aortic valve stenosis.  7. The inferior vena cava is normal in size with greater than 50% respiratory variability, suggesting right atrial pressure of 3 mmHg. Comparison(s): Changes from prior study are noted. EF now 30-35%. MR is now mild to moderate. 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 FINDINGS  Left Ventricle: LV myocardium is hyper trabeculated. Consider contrast echo or MRI to exclude noncompaction. Left ventricular ejection fraction, by estimation, is 30 to 35%. The left ventricle has moderately decreased function. The left ventricle demonstrates global hypokinesis. The left  ventricular internal cavity size was normal in size. There is no left ventricular hypertrophy. Indeterminate diastolic filling due to E-A fusion. Right Ventricle: The right ventricular size is normal. No increase in righ

## 2021-06-17 NOTE — ED Notes (Signed)
Pt bed changed, pt had incontinent episodes but denies incontinence. Bed was changed, pt repositioned and cleaned up, given new linens and gown. This RN noticed sacral dressing on pt and when it was removed there is a deep pressure wound through the fatty layer.  ?

## 2021-06-17 NOTE — ED Notes (Signed)
Pt denies numbness and tingling. Pt states he has been urinating on himself. Pt has 2+ pedal pulses bilat, cap refill less than 3 sec, warm to touch, pt able to move feet. ?

## 2021-06-17 NOTE — ED Notes (Signed)
Pt situated w/urinal between legs for sample  ?

## 2021-06-17 NOTE — ED Notes (Signed)
X2 unsuccessful attempts at IVs ?

## 2021-06-17 NOTE — ED Notes (Signed)
Rounded on pt, brought pillows, pt still unable to provide sample  ?

## 2021-06-18 ENCOUNTER — Inpatient Hospital Stay (HOSPITAL_COMMUNITY): Payer: Medicaid Other | Admitting: Anesthesiology

## 2021-06-18 ENCOUNTER — Encounter (HOSPITAL_COMMUNITY): Payer: Self-pay | Admitting: Internal Medicine

## 2021-06-18 ENCOUNTER — Inpatient Hospital Stay (HOSPITAL_COMMUNITY): Payer: Medicaid Other

## 2021-06-18 ENCOUNTER — Encounter (HOSPITAL_COMMUNITY)
Admission: EM | Disposition: A | Discharge status: Skilled Nursing Facility | Payer: Self-pay | Source: Home / Self Care | Attending: Internal Medicine

## 2021-06-18 ENCOUNTER — Encounter (HOSPITAL_BASED_OUTPATIENT_CLINIC_OR_DEPARTMENT_OTHER): Payer: MEDICAID | Admitting: General Surgery

## 2021-06-18 DIAGNOSIS — M4642 Discitis, unspecified, cervical region: Secondary | ICD-10-CM | POA: Diagnosis not present

## 2021-06-18 DIAGNOSIS — I10 Essential (primary) hypertension: Secondary | ICD-10-CM | POA: Diagnosis not present

## 2021-06-18 DIAGNOSIS — L98499 Non-pressure chronic ulcer of skin of other sites with unspecified severity: Secondary | ICD-10-CM | POA: Diagnosis not present

## 2021-06-18 DIAGNOSIS — A4101 Sepsis due to Methicillin susceptible Staphylococcus aureus: Secondary | ICD-10-CM | POA: Diagnosis not present

## 2021-06-18 DIAGNOSIS — M1990 Unspecified osteoarthritis, unspecified site: Secondary | ICD-10-CM | POA: Diagnosis not present

## 2021-06-18 DIAGNOSIS — R7881 Bacteremia: Secondary | ICD-10-CM | POA: Diagnosis not present

## 2021-06-18 DIAGNOSIS — B9561 Methicillin susceptible Staphylococcus aureus infection as the cause of diseases classified elsewhere: Secondary | ICD-10-CM

## 2021-06-18 DIAGNOSIS — M4622 Osteomyelitis of vertebra, cervical region: Secondary | ICD-10-CM

## 2021-06-18 DIAGNOSIS — J90 Pleural effusion, not elsewhere classified: Secondary | ICD-10-CM | POA: Diagnosis not present

## 2021-06-18 DIAGNOSIS — M5134 Other intervertebral disc degeneration, thoracic region: Secondary | ICD-10-CM | POA: Diagnosis not present

## 2021-06-18 DIAGNOSIS — M4802 Spinal stenosis, cervical region: Secondary | ICD-10-CM | POA: Diagnosis not present

## 2021-06-18 DIAGNOSIS — M4807 Spinal stenosis, lumbosacral region: Secondary | ICD-10-CM | POA: Diagnosis not present

## 2021-06-18 DIAGNOSIS — M5126 Other intervertebral disc displacement, lumbar region: Secondary | ICD-10-CM | POA: Diagnosis not present

## 2021-06-18 DIAGNOSIS — M48061 Spinal stenosis, lumbar region without neurogenic claudication: Secondary | ICD-10-CM | POA: Diagnosis not present

## 2021-06-18 DIAGNOSIS — I639 Cerebral infarction, unspecified: Secondary | ICD-10-CM | POA: Diagnosis not present

## 2021-06-18 DIAGNOSIS — G061 Intraspinal abscess and granuloma: Secondary | ICD-10-CM | POA: Diagnosis not present

## 2021-06-18 DIAGNOSIS — M419 Scoliosis, unspecified: Secondary | ICD-10-CM | POA: Diagnosis not present

## 2021-06-18 HISTORY — PX: RADIOLOGY WITH ANESTHESIA: SHX6223

## 2021-06-18 LAB — CBC
HCT: 32.6 % — ABNORMAL LOW (ref 39.0–52.0)
Hemoglobin: 10.4 g/dL — ABNORMAL LOW (ref 13.0–17.0)
MCH: 29.1 pg (ref 26.0–34.0)
MCHC: 31.9 g/dL (ref 30.0–36.0)
MCV: 91.1 fL (ref 80.0–100.0)
Platelets: 445 10*3/uL — ABNORMAL HIGH (ref 150–400)
RBC: 3.58 MIL/uL — ABNORMAL LOW (ref 4.22–5.81)
RDW: 16.4 % — ABNORMAL HIGH (ref 11.5–15.5)
WBC: 12.1 10*3/uL — ABNORMAL HIGH (ref 4.0–10.5)
nRBC: 0.2 % (ref 0.0–0.2)

## 2021-06-18 LAB — BASIC METABOLIC PANEL
Anion gap: 8 (ref 5–15)
BUN: 31 mg/dL — ABNORMAL HIGH (ref 6–20)
CO2: 24 mmol/L (ref 22–32)
Calcium: 8.2 mg/dL — ABNORMAL LOW (ref 8.9–10.3)
Chloride: 106 mmol/L (ref 98–111)
Creatinine, Ser: 0.74 mg/dL (ref 0.61–1.24)
GFR, Estimated: >60 mL/min (ref >60)
Glucose, Bld: 159 mg/dL — ABNORMAL HIGH (ref 70–99)
Potassium: 3 mmol/L — ABNORMAL LOW (ref 3.5–5.1)
Sodium: 138 mmol/L (ref 135–145)

## 2021-06-18 LAB — URINE CULTURE: Culture: NO GROWTH

## 2021-06-18 LAB — GLUCOSE, CAPILLARY: Glucose-Capillary: 120 mg/dL — ABNORMAL HIGH (ref 70–99)

## 2021-06-18 IMAGING — MR MR CERVICAL SPINE W/O CM
4 of 5 series · 21 of 48 positions shown · non-contrast
Comparison: Cervical spine MRI [DATE]

CLINICAL DATA: Cervical osteomyelitis.  MSSA bacteremia.

EXAM:
MRI CERVICAL SPINE WITHOUT CONTRAST
TECHNIQUE: Multiplanar, multisequence MR imaging of the cervical spine was
performed. No intravenous contrast was administered.

[Series 1: T1 · sagittal · 3.0mm · 0.69mm/px · 4 of 15 slices shown]
[im 1/15]
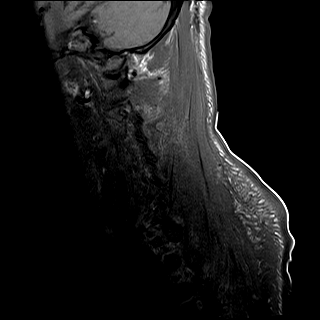
[im 5/15]
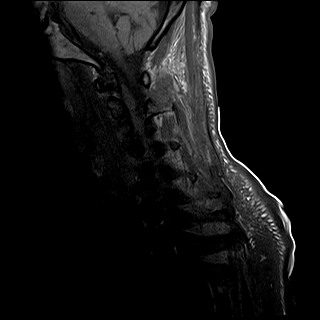
[im 10/15]
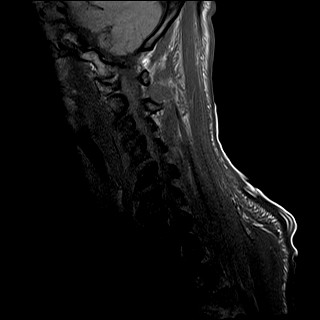
[im 15/15]
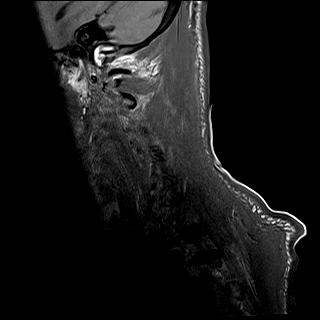

[Series 3: T2 · sagittal · 3.0mm · 0.43mm/px · 4 of 16 slices shown (1 of 2)]
[im 1/16]
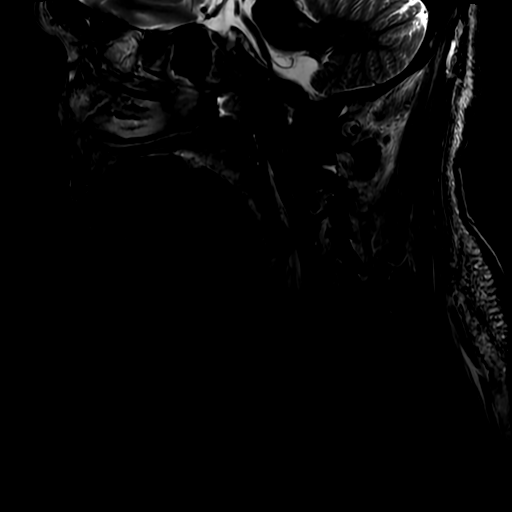
[im 6/16]
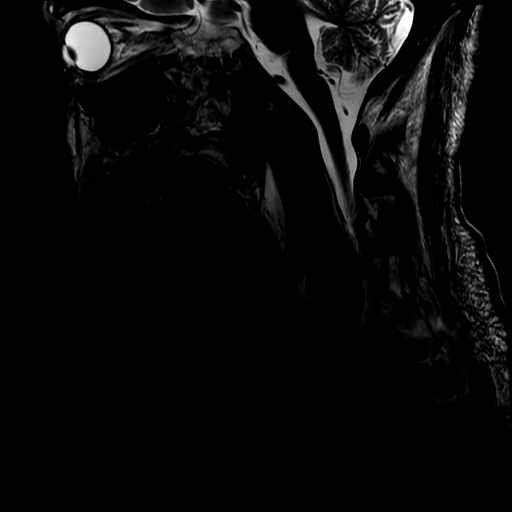
[im 11/16]
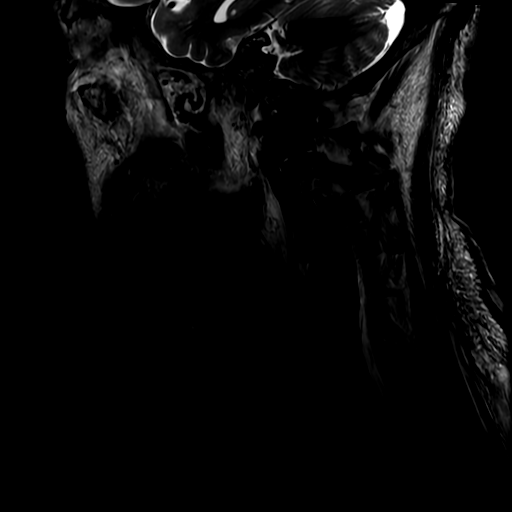
[im 16/16]
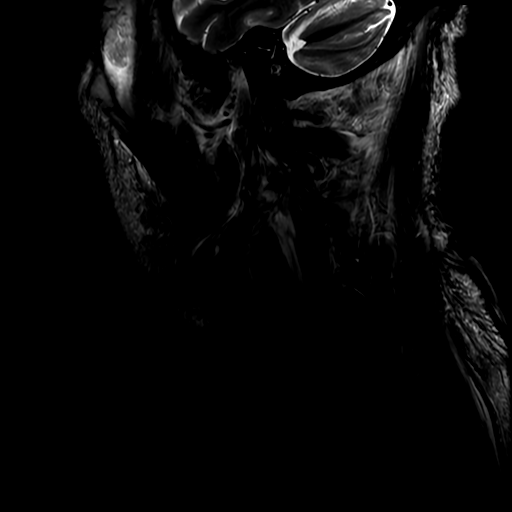

[Series 4: STIR · sagittal · 3.0mm · 0.43mm/px · 4 of 16 slices shown]
[im 1/16]
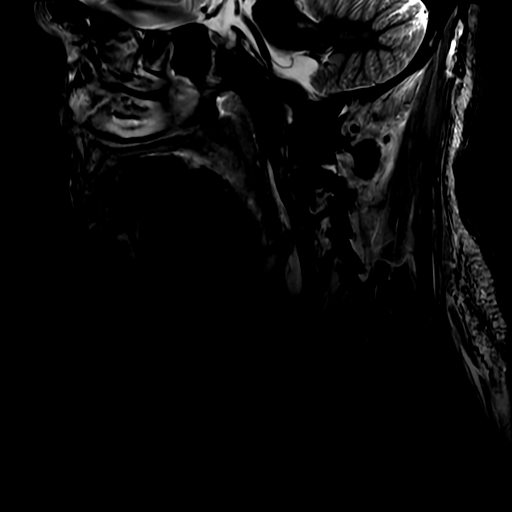
[im 6/16]
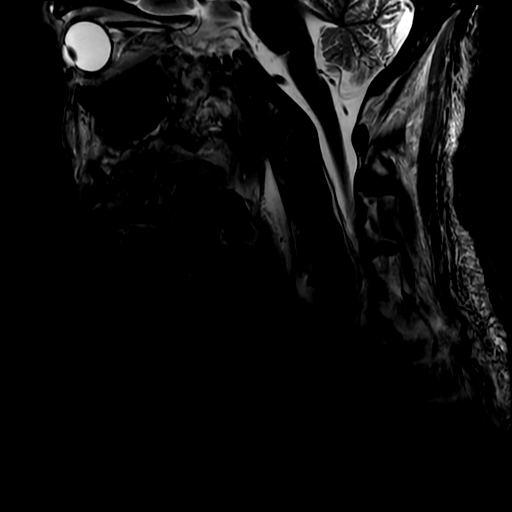
[im 11/16]
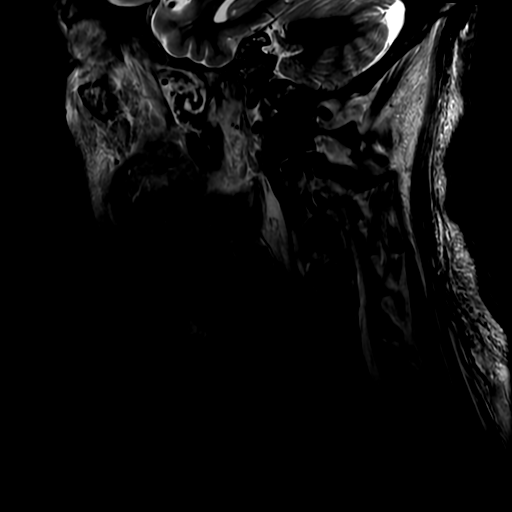
[im 16/16]
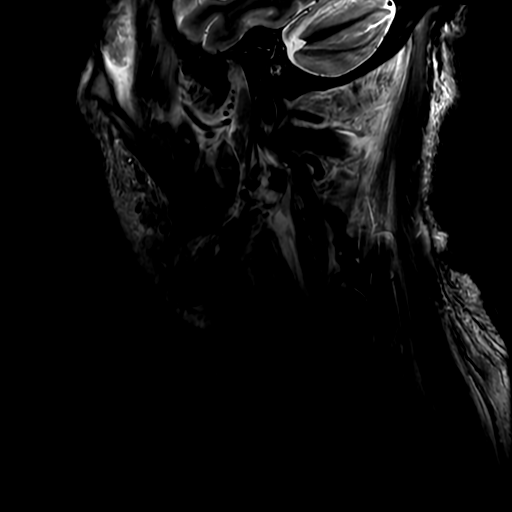

[Series 5: T2 · axial · 3.0mm · 0.35mm/px · z∈[-50,+64]mm · 9 of 34 slices shown (2 of 2)]
[im 1/34]
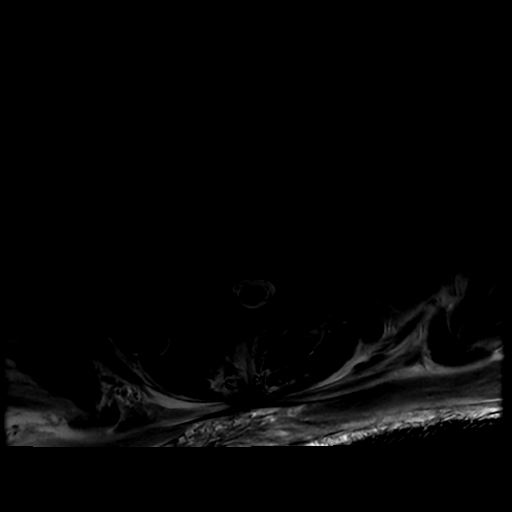
[im 5/34]
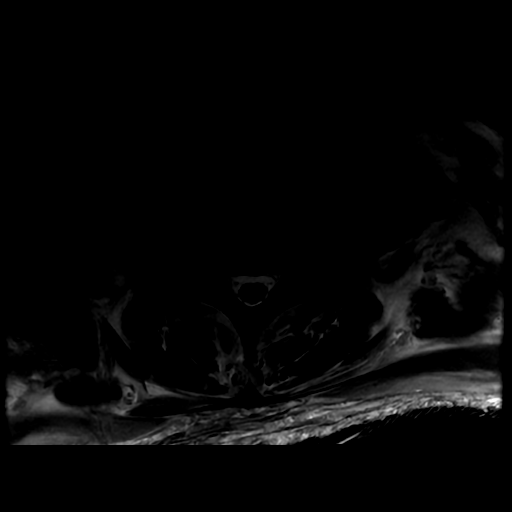
[im 9/34]
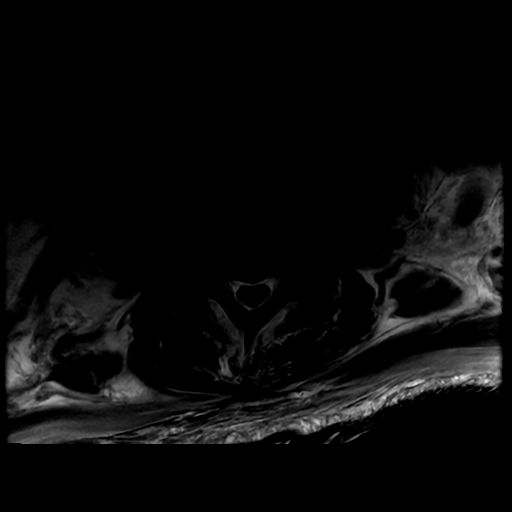
[im 13/34]
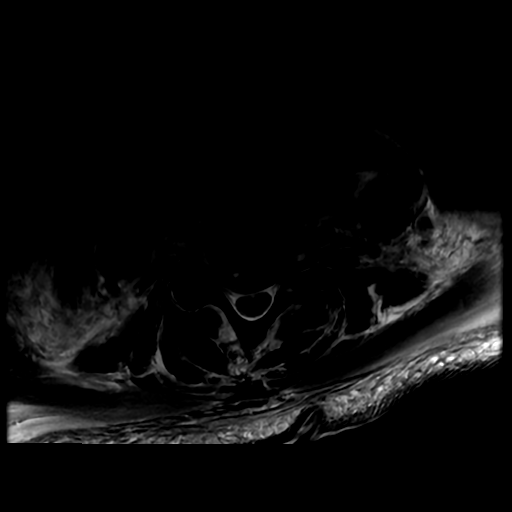
[im 17/34]
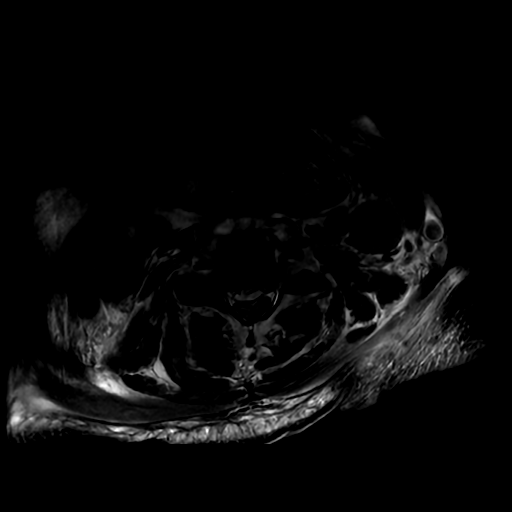
[im 21/34]
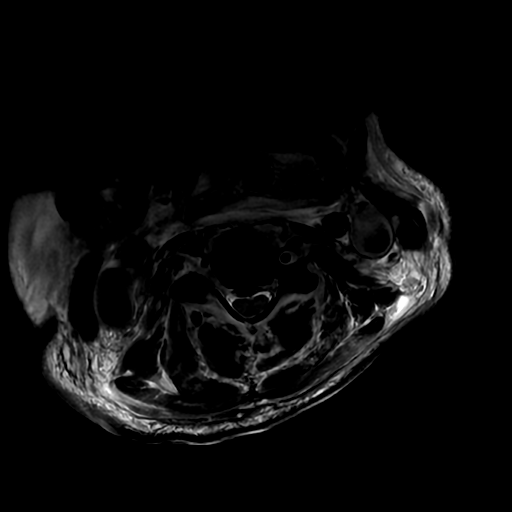
[im 25/34]
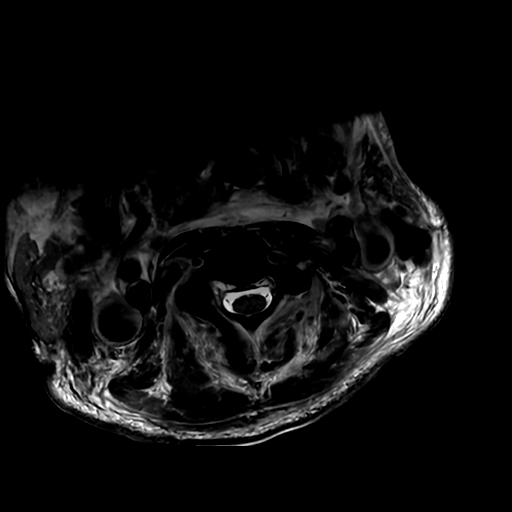
[im 29/34]
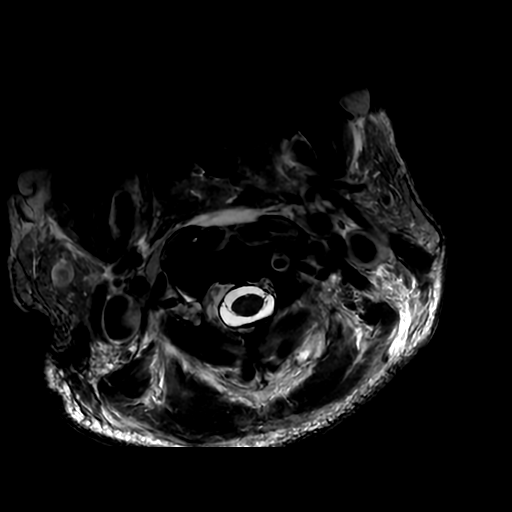
[im 34/34]
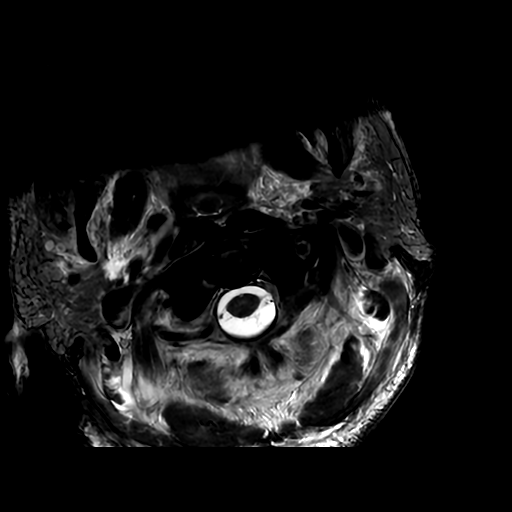

[21 of 48 positions shown; findings below may reference images not displayed]

FINDINGS: Alignment: Straightening/mild reversal of the normal cervical
lordosis. No significant listhesis.

Vertebrae: Diffusely diminished bone marrow T1 signal intensity
likely related to known anemia. No fracture. New midline ventral
epidural fluid collection extending from the C4 superior to C5
inferior endplates and measuring 5 x 10 x 30 mm (AP x transverse x
craniocaudal). This likely extends from the C4-5 disc space which is
mildly narrowed and demonstrates areas of fluid signal, most notably
anteriorly. Similar appearance of endplate changes at C5-6 and C6-7
compared to the prior MRI which may be degenerative.

Cord: Moderate cord flattening at C4-5 by the epidural collection.
No definite cord edema.

Posterior Fossa, vertebral arteries, paraspinal tissues: New
prevertebral fluid which measures up to 1.1 cm in AP thickness at
the C2-3 level. Diffuse soft tissue edema throughout the neck likely
reflecting anasarca. Chronic pontine infarct.

Disc levels:

Moderate to severe spinal stenosis at C4-5 due to the epidural
collection.
IMPRESSION: 1. New ventral epidural fluid collection at C4-5 compatible with an
abscess and associated with discitis and a new prevertebral fluid
collection. Result in moderate to severe spinal stenosis. No cord
edema.
2. Anasarca.

These results will be called to the ordering clinician or
representative by the Radiologist Assistant, and communication
documented in the PACS or [REDACTED].

## 2021-06-18 IMAGING — MR MR LUMBAR SPINE W/O CM
4 of 5 series · 26 of 48 positions shown · non-contrast
Comparison: Lumbar spine radiographs [DATE]. CT abdomen and
pelvis [DATE].

CLINICAL DATA: Lumbar osteomyelitis.  MSSA bacteremia.

EXAM:
MRI LUMBAR SPINE WITHOUT CONTRAST
TECHNIQUE: Multiplanar, multisequence MR imaging of the lumbar spine was
performed. No intravenous contrast was administered.

[Series 1: T2 · sagittal · 4.0mm · 0.73mm/px · 6 of 16 slices shown (1 of 2)]
[im 1/16]
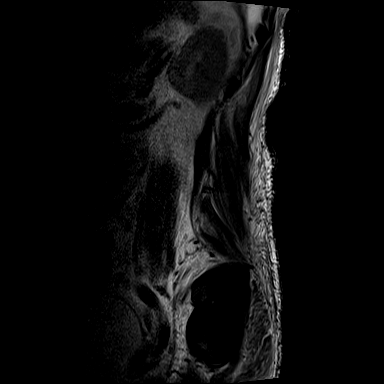
[im 4/16]
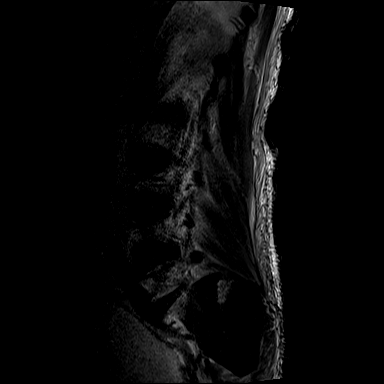
[im 7/16]
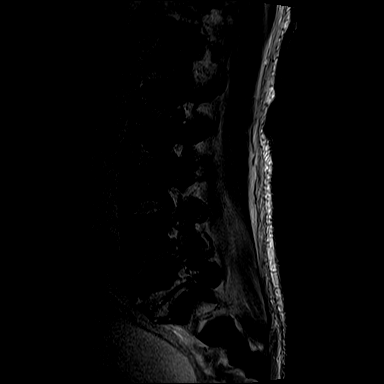
[im 10/16]
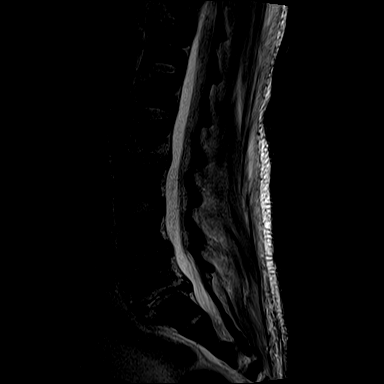
[im 13/16]
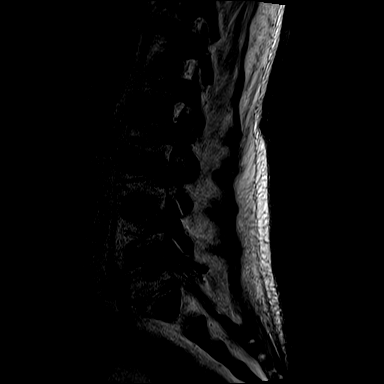
[im 16/16]
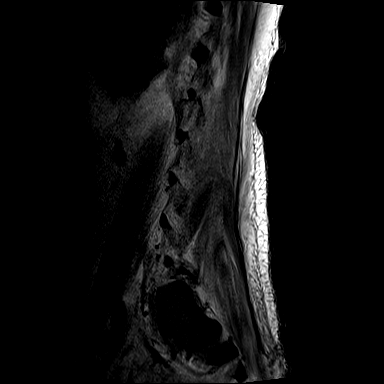

[Series 3: T1 · sagittal · 4.0mm · 0.88mm/px · 7 of 16 slices shown (1 of 2)]
[im 1/16]
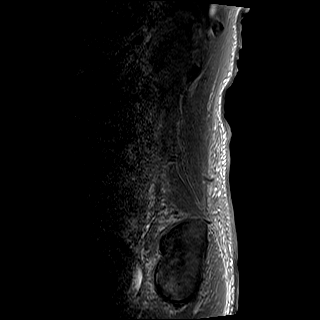
[im 3/16]
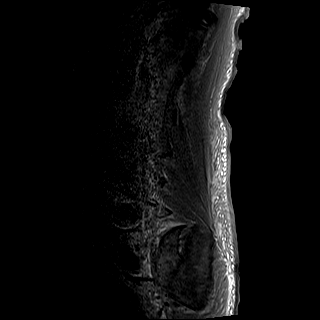
[im 6/16]
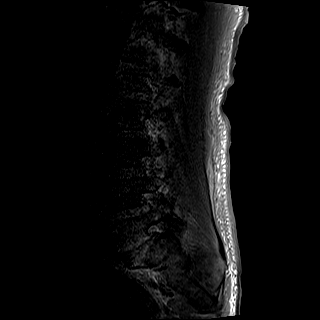
[im 8/16]
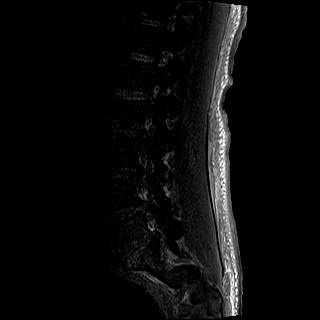
[im 11/16]
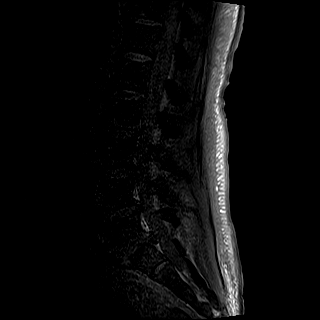
[im 13/16]
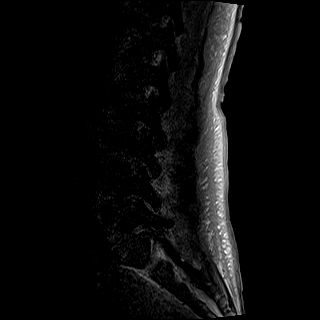
[im 16/16]
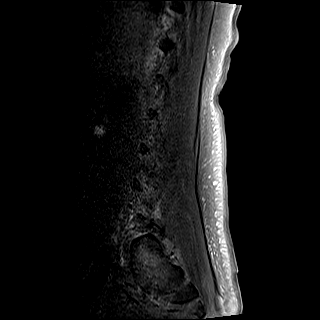

[Series 4: T2 · axial · 5.0mm · 0.57mm/px · z∈[-534,-274]mm · 8 of 31 slices shown (2 of 2)]
[im 1/31]
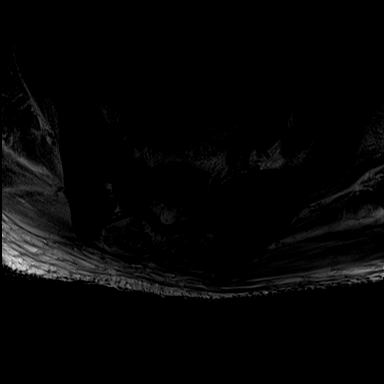
[im 5/31]
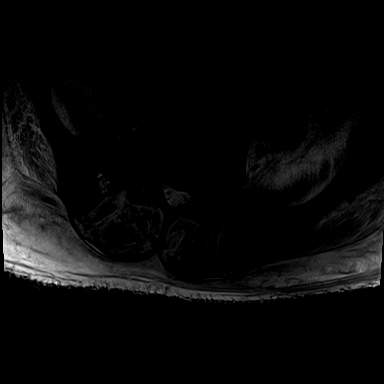
[im 10/31]
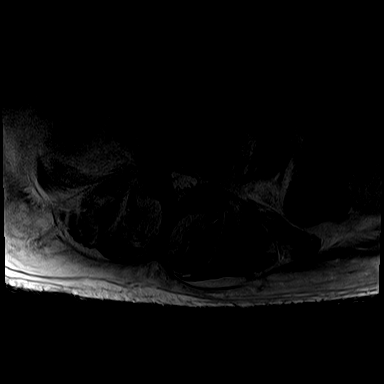
[im 14/31]
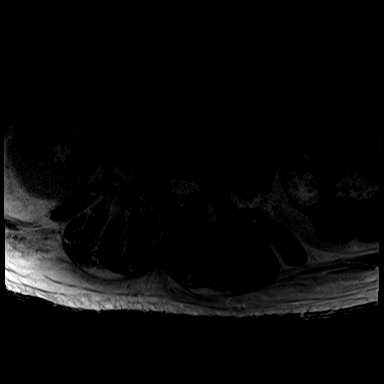
[im 17/31]
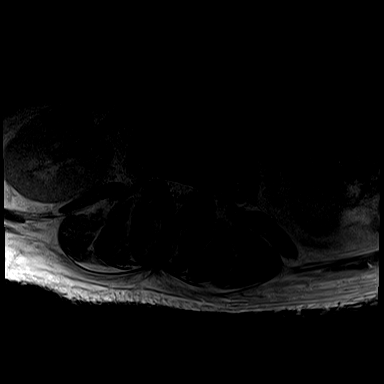
[im 21/31]
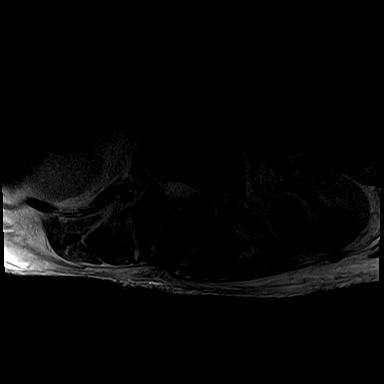
[im 26/31]
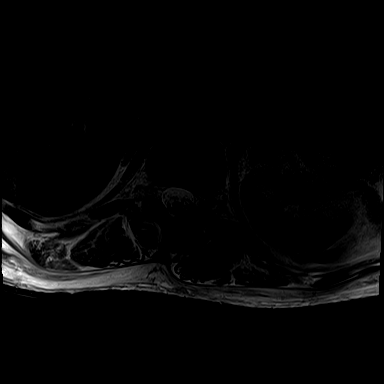
[im 31/31]
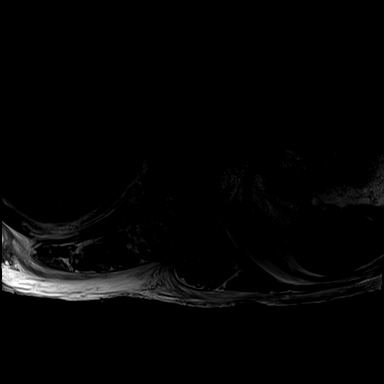

[Series 5: T1 · axial · 5.0mm · 0.34mm/px · z∈[-534,-311]mm · 5 of 31 slices shown (2 of 2)]
[im 1/31]
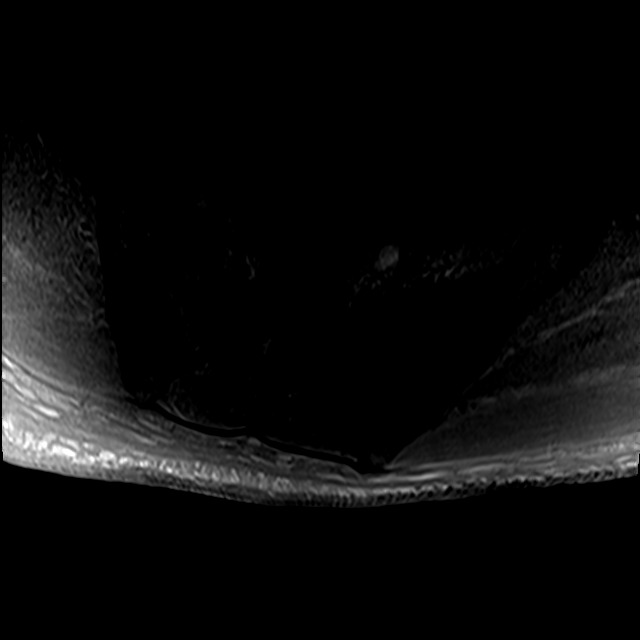
[im 5/31]
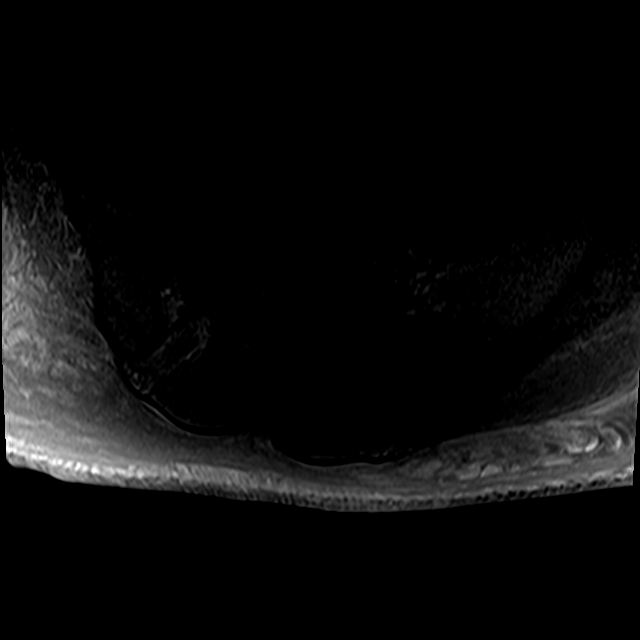
[im 10/31]
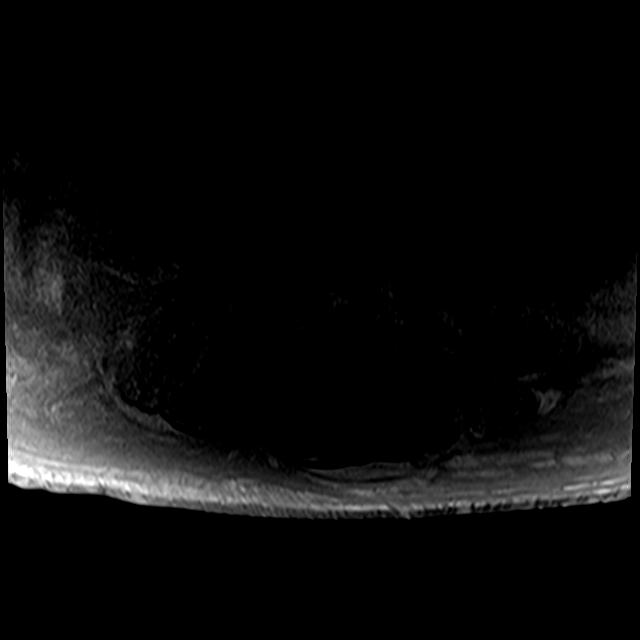
[im 17/31]
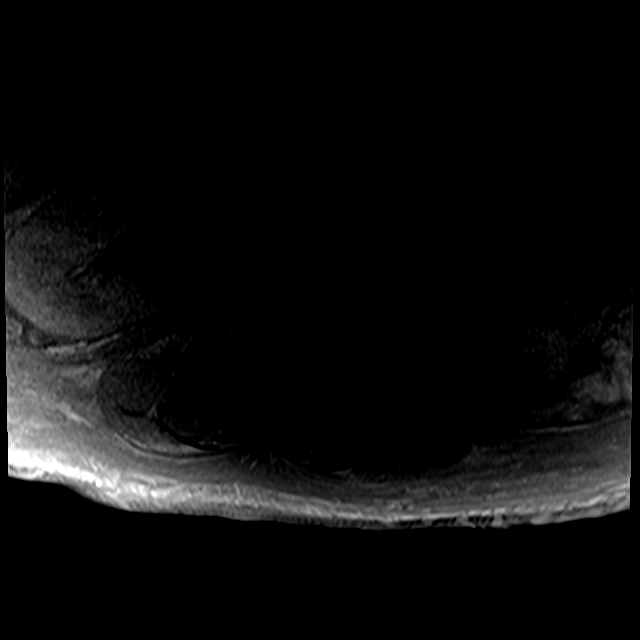
[im 26/31]
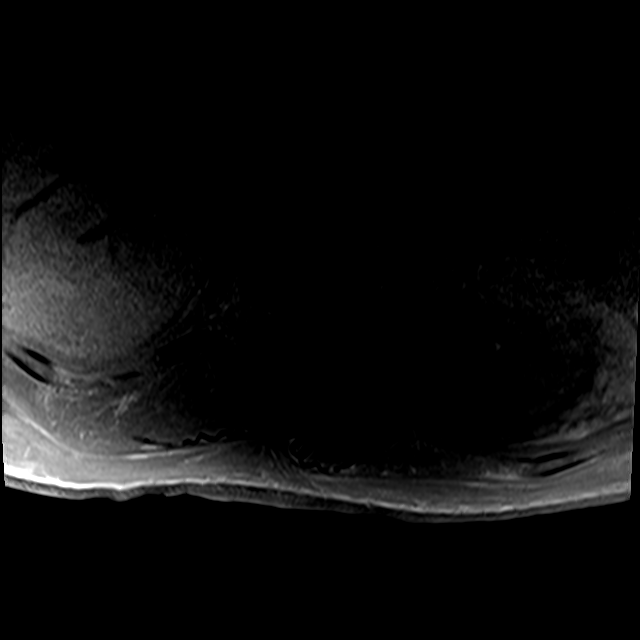

[26 of 48 positions shown; findings below may reference images not displayed]

FINDINGS: Image quality is degraded by anasarca and ascites.

Segmentation: Standard.

Alignment:  Normal.

Vertebrae: Diffusely diminished bone marrow T1 signal intensity
likely related to known anemia. Advanced disc space narrowing at
L5-S1 with a small amount of fluid signal in the disc space where
vacuum disc phenomenon was present on the prior CT, favored to be
degenerative and without gross interval endplate destruction to
clearly indicate discitis/osteomyelitis. No fracture.

Conus medullaris and cauda equina: Conus extends to the T12-L1
level. Conus and cauda equina appear normal.

Paraspinal and other soft tissues: Anasarca. No drainable paraspinal
fluid collection.

Disc levels:

L1-2: Minimal leftward disc bulging and mild facet arthrosis without
significant stenosis.

L2-3: Mild disc bulging and mild facet arthrosis without significant
stenosis.

L3-4: Mild left eccentric disc bulging and mild facet arthrosis
without significant stenosis.

L4-5: Mild disc desiccation and mild disc space narrowing. Disc
bulging, a left foraminal disc protrusion, and mild facet arthrosis
result in mild spinal stenosis, mild-to-moderate bilateral lateral
recess stenosis, and moderate bilateral neural foraminal stenosis.

L5-S1: Severe disc space narrowing. Disc bulging, endplate spurring,
and moderate facet arthrosis result in moderate bilateral neural
foraminal stenosis without significant spinal stenosis.
IMPRESSION: 1. No definite lumbar spine infection. Disc and endplate changes at
L5-S1 are favored to be degenerative rather than reflective of
discitis.
2. Mild spinal stenosis at L4-5.
3. Moderate neural foraminal stenosis at L4-5 and L5-S1.

## 2021-06-18 IMAGING — MR MR THORACIC SPINE W/O CM
7 of 12 series · 20 of 48 positions shown · non-contrast
Comparison: Thoracic spine radiographs [DATE]. Thoracic spine
MRI [DATE]. Chest CT [DATE].

CLINICAL DATA: Thoracic osteomyelitis. MSSA bacteremia.

EXAM:
MRI THORACIC SPINE WITHOUT CONTRAST
TECHNIQUE: Multiplanar, multisequence MR imaging of the thoracic spine was
performed. No intravenous contrast was administered.

[Series 3: T2 · sagittal · 3.0mm · 0.43mm/px · 3 of 16 slices shown (1 of 4)]
[im 1/16]
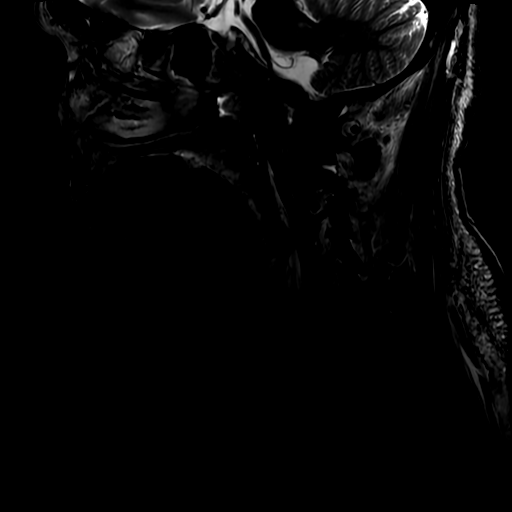
[im 8/16]
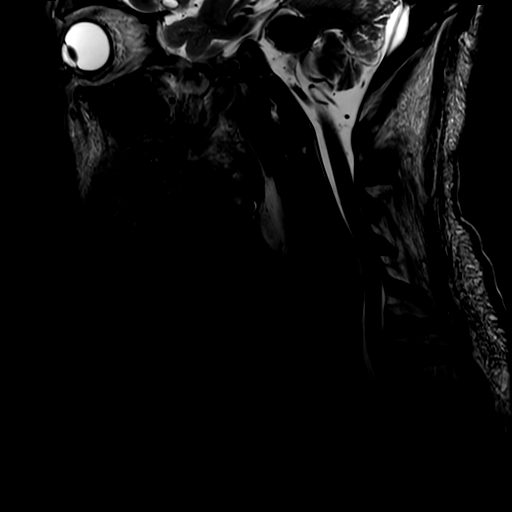
[im 16/16]
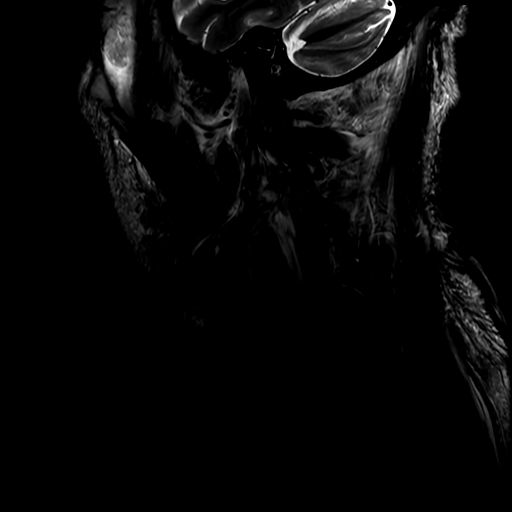

[Series 5: T2 · axial · 3.0mm · 0.35mm/px · z∈[-50,+64]mm · 5 of 34 slices shown (2 of 4)]
[im 1/34]
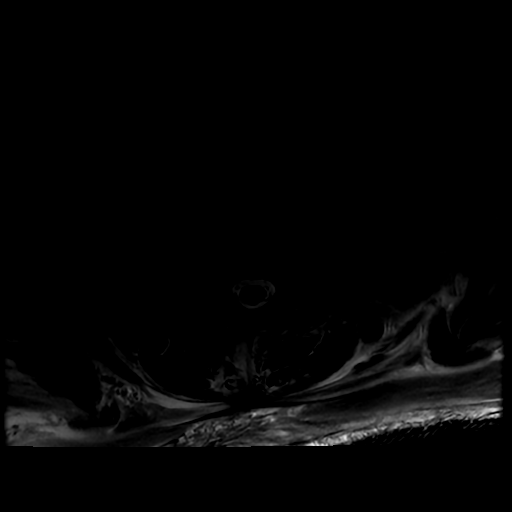
[im 9/34]
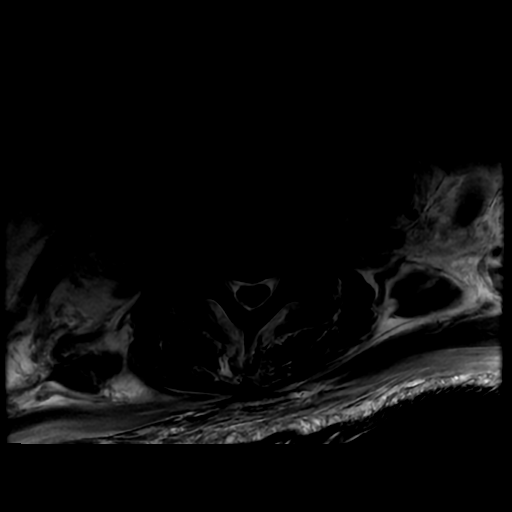
[im 17/34]
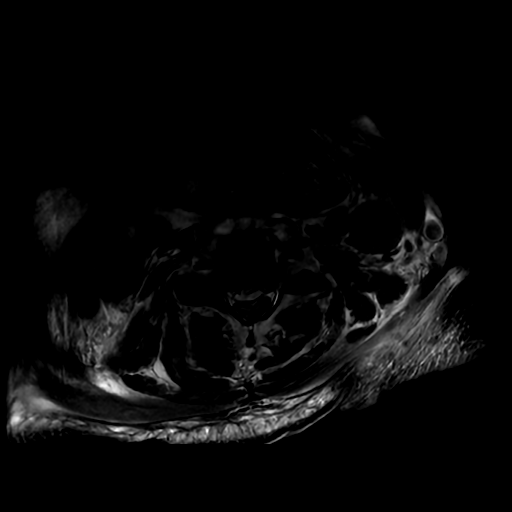
[im 25/34]
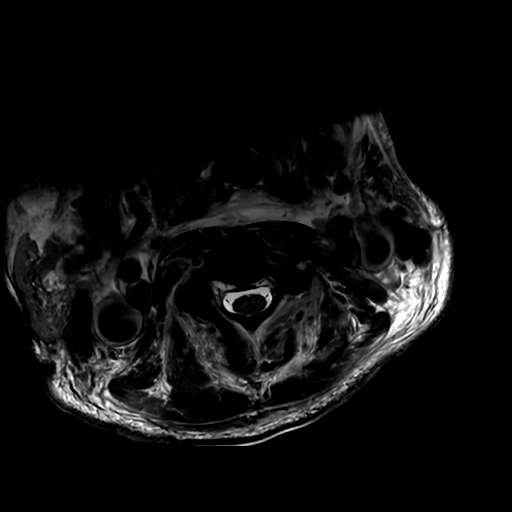
[im 34/34]
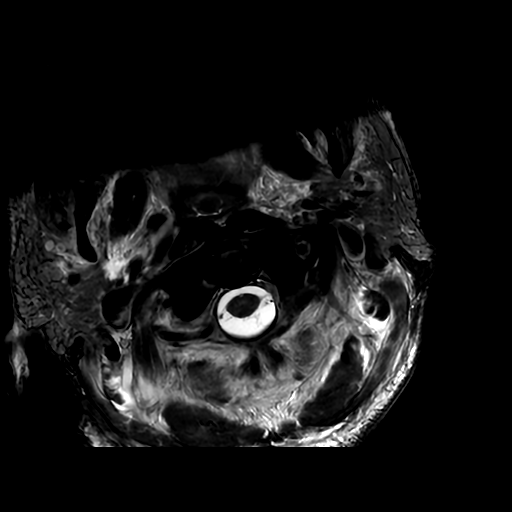

[Series 18: T1 · sagittal · 3.3mm · 0.62mm/px · 1 of 8 slices shown (1 of 3)]
[im 1/8]
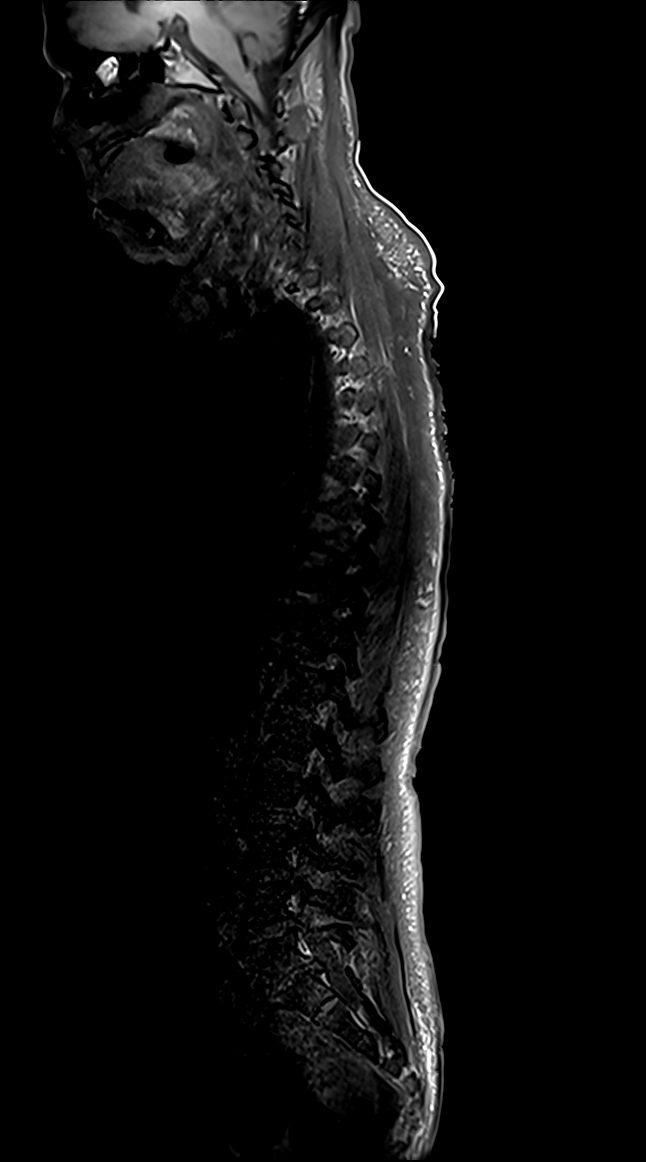

[Series 19: T2 · sagittal · 3.0mm · 0.76mm/px · 2 of 17 slices shown (3 of 4)]
[im 1/17]
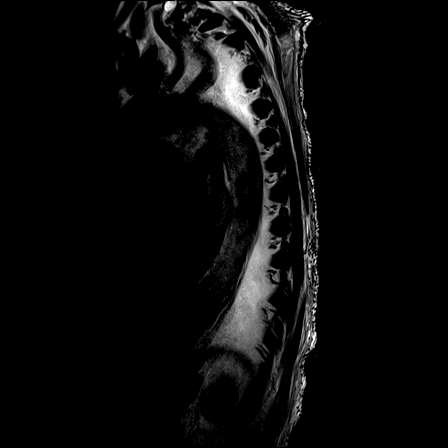
[im 17/17]
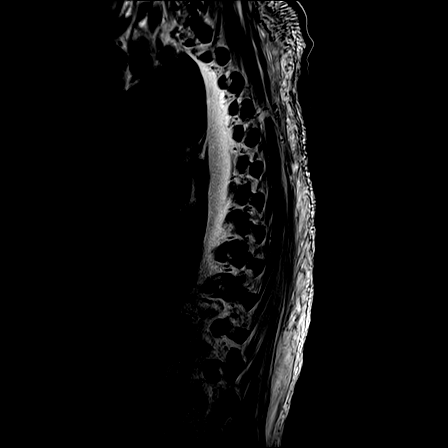

[Series 20: T1 · sagittal · 3.0mm · 0.76mm/px · 2 of 17 slices shown (2 of 3)]
[im 1/17]
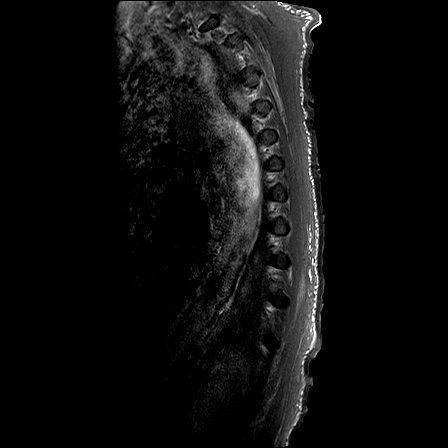
[im 17/17]
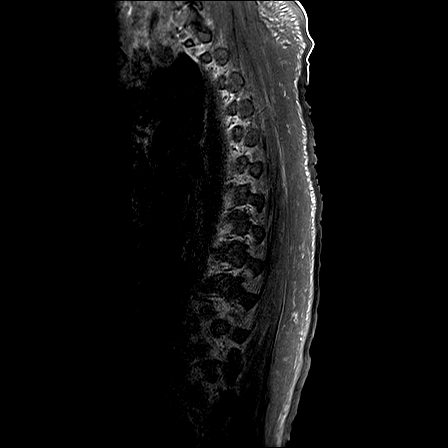

[Series 22: T2 · axial · 5.0mm · 0.59mm/px · z∈[-321,-77]mm · 5 of 39 slices shown (4 of 4)]
[im 1/39]
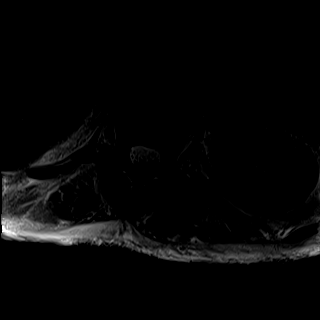
[im 10/39]
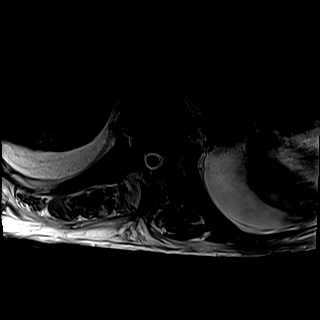
[im 20/39]
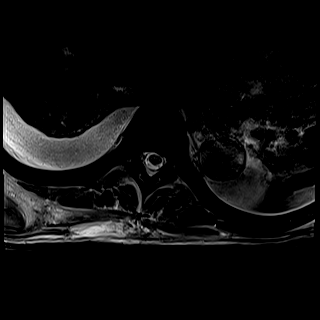
[im 29/39]
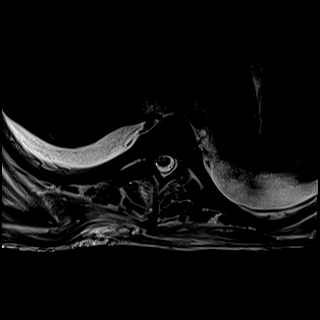
[im 39/39]
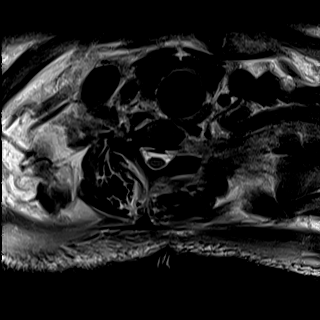

[Series 24: T1 · axial · non-contrast · 5.0mm · 0.31mm/px · z∈[-321,-240]mm · 2 of 39 slices shown (3 of 3)]
[im 1/39]
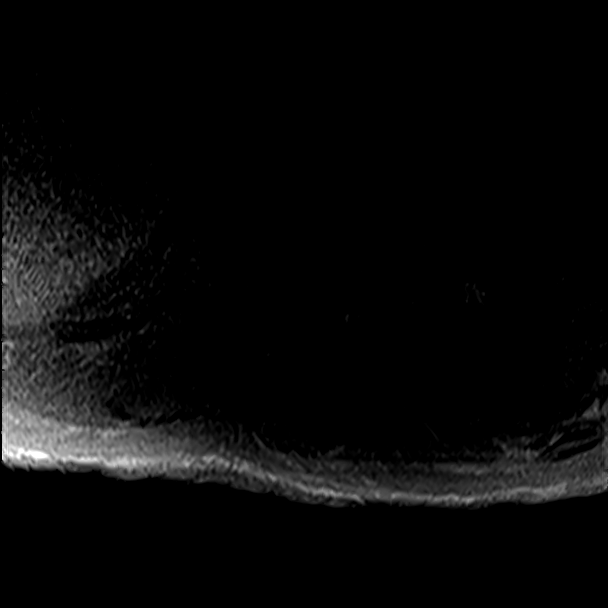
[im 10/39]
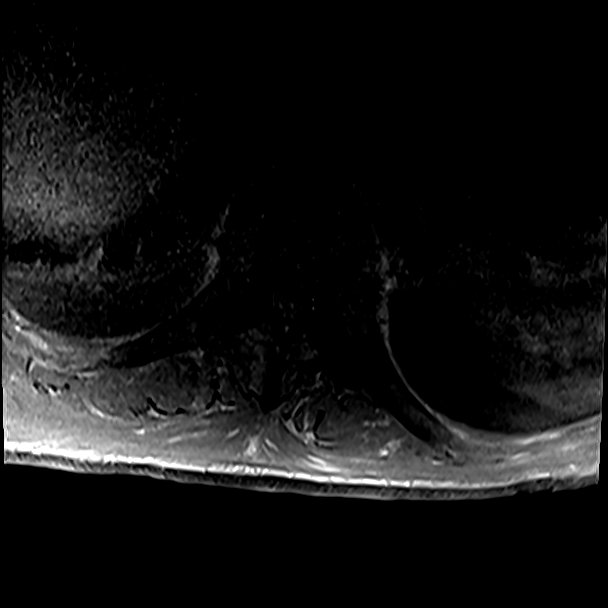

[20 of 48 positions shown; findings below may reference images not displayed]

FINDINGS: Alignment:  Mild scoliosis.  No listhesis.

Vertebrae: Diffusely diminished bone marrow T1 signal intensity
likely related to known anemia. Similar appearance scattered foci of
T2 hyperintensity along multiple thoracic endplates compared to the
prior MRI, likely degenerative. No definite findings of discitis or
epidural abscess. Persistent right-sided facet edema at T4-5,
favored to be degenerative given lack of gross interval osseous
destruction, no significant joint fluid, and appearance on the prior
CT.

Cord:  Normal cord signal and morphology.

Paraspinal and other soft tissues: Small bilateral pleural
effusions. Diffuse body wall edema.

Disc levels:

Similar appearance of mild multilevel thoracic disc degeneration
without compressive stenosis.
IMPRESSION: 1. No definite evidence of thoracic spine infection.
2. Persistent right-sided facet edema at T4-5, favored to be
degenerative.
3. Small bilateral pleural effusions and diffuse body wall
edema/anasarca.

## 2021-06-18 SURGERY — MRI WITH ANESTHESIA
Anesthesia: General

## 2021-06-18 MED ORDER — FENTANYL CITRATE (PF) 250 MCG/5ML IJ SOLN
INTRAMUSCULAR | Status: DC | PRN
  Administered 2021-06-18: 100 ug via INTRAVENOUS

## 2021-06-18 MED ORDER — ACETAMINOPHEN 10 MG/ML IV SOLN: 1000.0000 mg | Freq: Once | INTRAVENOUS | Status: DC | PRN

## 2021-06-18 MED ORDER — LIDOCAINE 5 % EX PTCH
1.0000 | MEDICATED_PATCH | CUTANEOUS | Status: DC
  Administered 2021-06-18 – 2021-07-05 (×14): 1 via TRANSDERMAL
  Filled 2021-06-18 (×15): qty 1

## 2021-06-18 MED ORDER — ACETAMINOPHEN 160 MG/5ML PO SOLN: 1000.0000 mg | Freq: Once | ORAL | Status: DC | PRN

## 2021-06-18 MED ORDER — CHLORHEXIDINE GLUCONATE 0.12 % MT SOLN: 15.0000 mL | Freq: Once | OROMUCOSAL | Status: AC

## 2021-06-18 MED ORDER — LIDOCAINE 2% (20 MG/ML) 5 ML SYRINGE
INTRAMUSCULAR | Status: DC | PRN
  Administered 2021-06-18: 60 mg via INTRAVENOUS

## 2021-06-18 MED ORDER — CHLORHEXIDINE GLUCONATE 0.12 % MT SOLN
OROMUCOSAL | Status: AC
  Administered 2021-06-18: 15 mL via OROMUCOSAL
  Filled 2021-06-18: qty 15

## 2021-06-18 MED ORDER — PHENYLEPHRINE 80 MCG/ML (10ML) SYRINGE FOR IV PUSH (FOR BLOOD PRESSURE SUPPORT)
PREFILLED_SYRINGE | INTRAVENOUS | Status: DC | PRN
  Administered 2021-06-18: 80 ug via INTRAVENOUS

## 2021-06-18 MED ORDER — ORAL CARE MOUTH RINSE: 15.0000 mL | Freq: Once | OROMUCOSAL | Status: AC

## 2021-06-18 MED ORDER — ALBUMIN HUMAN 5 % IV SOLN: INTRAVENOUS | Status: DC | PRN

## 2021-06-18 MED ORDER — LACTATED RINGERS IV SOLN: INTRAVENOUS | Status: DC

## 2021-06-18 MED ORDER — ROCURONIUM BROMIDE 10 MG/ML (PF) SYRINGE
PREFILLED_SYRINGE | INTRAVENOUS | Status: DC | PRN
  Administered 2021-06-18: 50 mg via INTRAVENOUS
  Administered 2021-06-18: 100 mg via INTRAVENOUS

## 2021-06-18 MED ORDER — DEXAMETHASONE SODIUM PHOSPHATE 10 MG/ML IJ SOLN
INTRAMUSCULAR | Status: DC | PRN
  Administered 2021-06-18: 10 mg via INTRAVENOUS

## 2021-06-18 MED ORDER — PROPOFOL 10 MG/ML IV BOLUS
INTRAVENOUS | Status: DC | PRN
  Administered 2021-06-18: 100 mg via INTRAVENOUS

## 2021-06-18 MED ORDER — SUGAMMADEX SODIUM 200 MG/2ML IV SOLN
INTRAVENOUS | Status: DC | PRN
  Administered 2021-06-18: 200 mg via INTRAVENOUS

## 2021-06-18 MED ORDER — MIDAZOLAM HCL 2 MG/2ML IJ SOLN
INTRAMUSCULAR | Status: DC | PRN
  Administered 2021-06-18: 2 mg via INTRAVENOUS

## 2021-06-18 MED ORDER — PHENYLEPHRINE HCL-NACL 20-0.9 MG/250ML-% IV SOLN
INTRAVENOUS | Status: DC | PRN
  Administered 2021-06-18: 80 ug/min via INTRAVENOUS

## 2021-06-18 MED ORDER — ONDANSETRON HCL 4 MG/2ML IJ SOLN
INTRAMUSCULAR | Status: DC | PRN
Start: 2021-06-18 — End: 2021-06-18
  Administered 2021-06-18: 4 mg via INTRAVENOUS

## 2021-06-18 MED ORDER — PROPOFOL 500 MG/50ML IV EMUL
INTRAVENOUS | Status: DC | PRN
  Administered 2021-06-18: 100 ug/kg/min via INTRAVENOUS

## 2021-06-18 MED ORDER — ACETAMINOPHEN 500 MG PO TABS: 1000.0000 mg | ORAL_TABLET | Freq: Once | ORAL | Status: DC | PRN

## 2021-06-18 MED ORDER — POTASSIUM CHLORIDE CRYS ER 20 MEQ PO TBCR
40.0000 meq | EXTENDED_RELEASE_TABLET | Freq: Two times a day (BID) | ORAL | Status: DC
  Administered 2021-06-18 – 2021-06-21 (×6): 40 meq via ORAL
  Filled 2021-06-18 (×6): qty 2
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
## 2021-06-18 NOTE — Anesthesia Preprocedure Evaluation (Addendum)
Anesthesia Evaluation  ?Patient identified by MRN, date of birth, ID band ?Patient awake ? ? ? ?Reviewed: ?Allergy & Precautions, NPO status , Patient's Chart, lab work & pertinent test results ? ?Airway ?Mallampati: I ? ?TM Distance: >3 FB ?Neck ROM: Full ? ? ? Dental ?no notable dental hx. ?(+) Loose, Dental Advisory Given, Poor Dentition,  ?  ?Pulmonary ?former smoker,  ?  ?Pulmonary exam normal ?breath sounds clear to auscultation ? ? ? ? ? ? Cardiovascular ?hypertension, Pt. on medications ?Normal cardiovascular exam ?Rhythm:Regular Rate:Normal ? ? ?  ?Neuro/Psych ?CVA   ? GI/Hepatic ?Neg liver ROS, GERD  ,  ?Endo/Other  ?negative endocrine ROS ? Renal/GU ?negative Renal ROSLab Results ?     Component                Value               Date                 ?     CREATININE               0.74                06/18/2021           ?       K                        3.0 (L)             06/18/2021           ?       ? ?  ?Musculoskeletal ? ?(+) Arthritis ,  ? Abdominal ?  ?Peds ? Hematology ?Lab Results ?     Component                Value               Date                 ?     WBC                      12.1 (H)            06/18/2021           ?     HGB                      10.4 (L)            06/18/2021           ?     HCT                      32.6 (L)            06/18/2021           ?     MCV                      91.1                06/18/2021           ?     PLT                      445 (H)  06/18/2021           ?   ?Anesthesia Other Findings ? ? Reproductive/Obstetrics ? ?  ? ? ? ? ? ? ? ? ? ? ? ? ? ?  ?  ? ? ? ? ? ? ?Anesthesia Physical ?Anesthesia Plan ? ?ASA: 3 ? ?Anesthesia Plan: General  ? ?Post-op Pain Management: Minimal or no pain anticipated  ? ?Induction: Intravenous ? ?PONV Risk Score and Plan: 3 and Treatment may vary due to age or medical condition, Ondansetron and Midazolam ? ?Airway Management Planned: Oral ETT ? ?Additional Equipment: None ? ?Intra-op  Plan:  ? ?Post-operative Plan: Extubation in OR ? ?Informed Consent: I have reviewed the patients History and Physical, chart, labs and discussed the procedure including the risks, benefits and alternatives for the proposed anesthesia with the patient or authorized representative who has indicated his/her understanding and acceptance.  ? ? ? ?Dental advisory given ? ?Plan Discussed with: CRNA ? ?Anesthesia Plan Comments:   ? ? ? ? ? ?Anesthesia Quick Evaluation ? ?

## 2021-06-18 NOTE — Progress Notes (Signed)
Initial Nutrition Assessment ? ?DOCUMENTATION CODES:  ? ?Severe malnutrition in context of chronic illness ? ?INTERVENTION:  ?- Boost Breeze po TID, each supplement provides 250 kcal and 9 grams of protein ? ?- 30 ml ProSource Plus TID, each supplement provides 100 kcals and 15 grams protein.  ? ?- MVI with minerals daily ? ?- Double protein with meals ? ?NUTRITION DIAGNOSIS:  ? ?Severe Malnutrition related to chronic illness (h/o hernia) as evidenced by severe fat depletion, severe muscle depletion. ? ?GOAL:  ? ?Patient will meet greater than or equal to 90% of their needs ? ?MONITOR:  ? ?PO intake, Supplement acceptance, Diet advancement, Labs, Weight trends ? ?REASON FOR ASSESSMENT:  ? ?Malnutrition Screening Tool ?  ? ?ASSESSMENT:  ? ?Pt with recent hospitalization for incarcerated abdominal hernia with primary repair but left AMA prior to rehab placement. He was subsequently admitted with severe debility, malnutrition, and FTT from 4/3-5/10.  During the hospitalization, he developed MSSA bacteremia and sepsis and ID was following with plan for MRI of the spine and sacrum to r/o osteo.  He was also noted to have worsening EF.  He was upset about being NPO for multiple MRI scans with sedation and left AMA.  He returned within a few hours due to weakness and back pain. He reports that he walked out to the bus stop and took a misstep and fell. ? ?Spoke with pt at bedside. He reports feeling hungry but understands he is NPO for testing. He states that d/t h/o hernia, he had been unable to tolerate certain foods for a while, currently he endorses eating well when he is able. Pt endorses difficulty chewing and states that he is supposed to have his teeth fixed soon. Pt was requesting chopped meats, placed order for this in [facility]. Pt is agreeable to continue to receive Boost Breeze and ProSource throughout admission to aid in wound healing.  ? ?Pt endorses a usual weight of 161 lbs and is unsure of weight loss.  Pt's weight noted to continue trending down within the last year. His current admit weight is +3.5kg since 03/24. He is noted to have a 9% weight change within the last 6 months.  ? ?Medications: colace, lasix, IV abx ? ?Labs: potassium 3.0, BUN 31 ? ?NUTRITION - FOCUSED PHYSICAL EXAM: ? ?Flowsheet Row Most Recent Value  ?Orbital Region Severe depletion  ?Upper Arm Region Severe depletion  ?Thoracic and Lumbar Region Moderate depletion  ?Buccal Region Severe depletion  ?Temple Region Severe depletion  ?Clavicle Bone Region Severe depletion  ?Clavicle and Acromion Bone Region Severe depletion  ?Scapular Bone Region Severe depletion  ?Dorsal Hand Moderate depletion  ?Patellar Region Severe depletion  ?Anterior Thigh Region Severe depletion  ?Posterior Calf Region Severe depletion  ?Edema (RD Assessment) Moderate  [moderate BUE, mild BLE]  ?Hair Reviewed  ?Eyes Reviewed  ?Mouth Reviewed  ?Skin Reviewed  ?Nails Reviewed  ? ?  ? ? ?Diet Order:   ?Diet Order   ? ?       ?  Diet NPO time specified  Diet effective now       ?  ? ?  ?  ? ?  ? ? ?EDUCATION NEEDS:  ? ?Education needs have been addressed ? ?Skin:  Skin Integrity Issues:: Stage III ?Stage III: per WOC: sacral wound (2.5 cm x 1.9 cm x 2 cm) has greatly improved from previous assessment on 06/07/21 ? ?Last BM:  5/8 ? ?Height:  ? ?Ht Readings from Last 1  Encounters:  ?06/18/21 5' 5.98" (1.676 m)  ? ? ?Weight:  ? ?Wt Readings from Last 1 Encounters:  ?06/18/21 67.3 kg  ? ?BMI:  Body mass index is 23.96 kg/m?. ? ?Estimated Nutritional Needs:  ? ?Kcal:  2100-2300 ? ?Protein:  100-115g ? ?Fluid:  >/=2L ? ?Clayborne Dana, RDN, LDN ?Clinical Nutrition ?

## 2021-06-18 NOTE — Consult Note (Addendum)
WOC Nurse Consult Note: ?Patient receiving care in [facility] [id number] ?This patient is known to the [facility] team from last admission. This wound has greatly improved from previous assessment on 06/07/21. Patient is experiencing a lot of pain but not from the wound. He is complaining of pain in his upper back/neck area. May benefit from a Lidocaine patch.  ?Reason for Consult: Sacral wound ?Wound type: Stage 3 pressure injury ?Pressure Injury POA: Yes ?Measurement: 2.5 cm x 1.9 cm x 2 cm ?Wound bed: 100% pink ?Drainage (amount, consistency, odor) Serosanguinous on the dressing removed with no odor.  ?Periwound: intact ?Dressing procedure/placement/frequency: ?Apply a moistened saline gauze into the wound loosely (do not pack tight, then cover with dry 4 x 4 and secure with sacral foam dressing, tip up. Change twice daily or PRN soiling.   ?Secretary is going to order air mattress and chair pad. Encourage patient to get up in chair 1-2 hours at a time.  ? ?Monitor the wound area(s) for worsening of condition such as: ?Signs/symptoms of infection, increase in size, development of or worsening of odor, ?development of pain, or increased pain at the affected locations.   ?Notify the medical team if any of these develop. ? ?Thank you for the consult. [facility] nurse will not follow at this time.   ?Please re-consult the [facility] team if needed. ? ?Cathlean Marseilles. Tamala Julian, MSN, RN, CMSRN, AGCNS, WTA ?Wound Treatment Associate ?Pager [phone]   ? ? ?  ?

## 2021-06-18 NOTE — Consult Note (Addendum)
? ?I have seen and examined the patient. I have personally reviewed the clinical findings, laboratory findings, microbiological data and imaging studies. The assessment and treatment plan was discussed with the Nurse Practitioner Logan Nelson. I agree with her/his recommendations except following additions/corrections. ? ?Patient seen 1 day ago for Hospital onset MSSA bacteremia with unclear cause with stage 3 sacral ulcer with tunneling who left AMA 5/10 and came several hours back with fall with striking head but no LOC/Increased back pain. Remains of Unasyn to cover MSSA bacteremia + possible infected sacral ulcer. Fu repeat blood cultures. TEE planned for 5/15. MRI spine and sacrum planned today.  ? ?Dr Logan Nelson on call this weekend and will follow cultures and pending imaging. I am back Monday  ? ?Logan Oz, MD ?Infectious Disease Physician ?[facility] ?[address] ?[place], [place] [address] ?Phone: [phone]  Fax: [phone] ? ? ? ?[place] [facility]   ? ?Date of Admission:  06/16/2021    ? ?Total days of antibiotics 5 ?        ?      ?Reason for Consult: MSSA Bacteremia     ?Referring Provider: Dr. Lorin Nelson ?Primary Care Provider: [name], MD ? ? ?ASSESSMENT: ? ?Logan Nelson is a 56 y/o male previously admitted with right sided weakness and decreased mobility believed to be related to deconditioning and poor nutrition with hospitalization course complicated by MSSA bacteremia of unclear source. Blood cultures from 06/16/21 remain without growth to date. Awaiting further MRI imaging of spine. TTE negative and will need TEE to rule out endocarditis. Continue current dose of Unasyn. Remaining medical and supportive care per primary team.  ? ?PLAN: ? ?Continue current dose of Unasyn ?Await MRI imaging. ?Monitor blood cultures for clearance of bacteremia. ?TEE to rule out endocarditis.  ?Remaining medical and supportive  care per primary team.  ? ? ?Principal Problem: ?  MSSA bacteremia ?Active Problems: ?  Hypertension ?  Protein-calorie malnutrition, severe ?  Anemia of chronic disease ?  Generalized weakness ?  Pressure injury of buttock, stage 2 ([place]) ?  Acute systolic (congestive) heart failure (HCC) ?  Prolonged QT interval ? ? ? docusate sodium  100 mg Oral BID  ? furosemide  20 mg Intravenous Daily  ? lidocaine  1 patch Transdermal Q24H  ? metoprolol tartrate  25 mg Oral BID  ? sodium chloride flush  3 mL Intravenous Q12H  ? ? ? ?HPI: Logan Nelson is a 56 y.o. male with previous medical history of hypertension and abdominal wall hernias s/p repair and incomplete treatment of MSSA bacteremia admitted with worsening back pain following leaving AMA on 06/16/21. ? ?Logan Nelson was recently admitted on 05/10/21 with right sided weakness and inability to walk with multiple falls. Multiple work ups completed were unrevealing and believed his symptoms were related to deconditioning and severe malnutrition. Course was complicated by development on fever of 101.5 F on 06/14/21 with blood cultures positive for MSSA. Initially started on vancomycin, cefepime and metronidazole. Antibiotics were de-escalated to Cefazolin/metronidazole. Repeat blood cultures on 06/16/21 have remained without growth to date. TTE without evidence of vegetation. Imaging was ordered however [name] did not want to remain NPO for the procedure and signed out AMA on 06/16/21.  ? ?Logan Nelson returned to the hospital on with weakness and increased back pain following a fall where he struck his head without any LOC. Readmitted and restarted on Unasyn. Imaging under  sedation has been ordered and planned for today. Currently without fever and continued back pain. Wound RN evaluated sacral wound with noted improvement from previous evaluation.  ? ? ?Review of Systems: ?Review of Systems  ?Constitutional:  Negative for chills, fever and weight loss.   ?Respiratory:  Negative for cough, shortness of breath and wheezing.   ?Cardiovascular:  Negative for chest pain and leg swelling.  ?Gastrointestinal:  Negative for abdominal pain, constipation, diarrhea, nausea and vomiting.  ?Musculoskeletal:  Positive for back pain.  ?Skin:  Negative for rash.  ? ? ?Past Medical History:  ?Diagnosis Date  ? Anemia   ? Arthritis   ? Bradycardia   ? Dyspnea   ? Falling episodes   ? GERD (gastroesophageal reflux disease)   ? Hernia of abdominal wall   ? Hypertension   ? Lower extremity edema   ? ?Past Surgical History:  ?Procedure Laterality Date  ? ABDOMINAL SURGERY    ? INGUINAL HERNIA REPAIR Right 04/30/2021  ? Procedure: HERNIA REPAIR INGUINAL WITH MESH;  Surgeon: Logan Nelson, Logan Major, MD;  Location: [place];  Service: General;  Laterality: Right;  ? LYSIS OF ADHESION  04/30/2021  ? Procedure: LYSIS OF ADHESION;  Surgeon: Logan Morn, MD;  Location: [place];  Service: General;;  ? RADIOLOGY WITH ANESTHESIA N/A 06/16/2021  ? Procedure: MRI CERVICAL THORACIC LUMBER AND SACRUM;  Surgeon: Radiologist, Medication, MD;  Location: [place];  Service: Radiology;  Laterality: N/A;  ? UMBILICAL HERNIA REPAIR N/A 04/30/2021  ? Procedure: OPEN UMBILICAL AND EPIGASTRIC HERNIA REPAIR;  Surgeon: Logan Morn, MD;  Location: [place];  Service: General;  Laterality: N/A;  ? ? ? ?Social History  ? ?Tobacco Use  ? Smoking status: Former  ?  Types: Cigarettes  ?  Quit date: 04/28/2020  ?  Years since quitting: 1.1  ? Smokeless tobacco: Never  ?Vaping Use  ? Vaping Use: Never used  ?Substance Use Topics  ? Alcohol use: Not Currently  ? Drug use: Not Currently  ? ? ?History reviewed. No pertinent family history. ? ?No Known Allergies ? ?OBJECTIVE: ?Blood pressure (!) 142/98, pulse 93, temperature 97.6 ?F (36.4 ?C), temperature source Oral, resp. rate 19, height 5' 5.98" (1.676 m), weight 67.3 kg, SpO2 99 %. ? ?Physical Exam ?Constitutional:   ?   General: He is not in acute distress. ?    Appearance: He is well-developed.  ?   Comments: Lying in bed; pleasant.   ?Cardiovascular:  ?   Rate and Rhythm: Normal rate and regular rhythm.  ?   Heart sounds: Normal heart sounds.  ?Pulmonary:  ?   Effort: Pulmonary effort is normal.  ?   Breath sounds: Normal breath sounds.  ?Skin: ?   General: Skin is warm and dry.  ?Neurological:  ?   Mental Status: He is alert and oriented to person, place, and time.  ?Psychiatric:     ?   Behavior: Behavior normal.     ?   Thought Content: Thought content normal.     ?   Judgment: Judgment normal.  ? ? ?Lab Results ?Lab Results  ?Component Value Date  ? WBC 12.1 (H) 06/18/2021  ? HGB 10.4 (L) 06/18/2021  ? HCT 32.6 (L) 06/18/2021  ? MCV 91.1 06/18/2021  ? PLT 445 (H) 06/18/2021  ?  ?Lab Results  ?Component Value Date  ? CREATININE 0.74 06/18/2021  ? BUN 31 (H) 06/18/2021  ? NA 138 06/18/2021  ? K 3.0 (L) 06/18/2021  ?  CL 106 06/18/2021  ? CO2 24 06/18/2021  ?  ?Lab Results  ?Component Value Date  ? ALT 17 06/17/2021  ? AST 24 06/17/2021  ? ALKPHOS 89 06/17/2021  ? BILITOT 1.0 06/17/2021  ?  ? ?Microbiology: ?Recent Results (from the past 240 hour(s))  ?Culture, blood (Routine X 2) w Reflex to ID Panel     Status: Abnormal  ? Collection Time: 06/14/21  4:45 PM  ? Specimen: BLOOD  ?Result Value Ref Range Status  ? Specimen Description BLOOD LEFT ANTECUBITAL  Final  ? Special Requests   Final  ?  BOTTLES DRAWN AEROBIC AND ANAEROBIC Blood Culture results may not be optimal due to an inadequate volume of blood received in culture bottles  ? Culture  Setup Time   Final  ?  GRAM POSITIVE COCCI IN CLUSTERS ?IN BOTH AEROBIC AND ANAEROBIC BOTTLES ?CRITICAL VALUE NOTED.  VALUE IS CONSISTENT WITH PREVIOUSLY REPORTED AND CALLED VALUE. ?  ? Culture (A)  Final  ?  STAPHYLOCOCCUS AUREUS ?SUSCEPTIBILITIES PERFORMED ON PREVIOUS CULTURE WITHIN THE LAST 5 DAYS. ?Performed at [facility], [place] [address], [place], [place] [address] ?  ? Report Status 06/17/2021 FINAL  Final  ?Culture,  blood (Routine X 2) w Reflex to ID Panel     Status: Abnormal  ? Collection Time: 06/14/21  4:55 PM  ? Specimen: BLOOD  ?Result Value Ref Range Status  ? Specimen Description BLOOD RIGHT ANTECUBITAL  Final  ? Special Requests

## 2021-06-18 NOTE — Anesthesia Procedure Notes (Signed)
Procedure Name: Intubation ?Date/Time: 06/18/2021 2:32 PM ?Performed by: Leonor Liv, CRNA ?Pre-anesthesia Checklist: Patient identified, Emergency Drugs available, Suction available and Patient being monitored ?Patient Re-evaluated:Patient Re-evaluated prior to induction ?Oxygen Delivery Method: Circle System Utilized ?Preoxygenation: Pre-oxygenation with 100% oxygen ?Induction Type: IV induction ?Ventilation: Mask ventilation without difficulty ?Laryngoscope Size: Mac and 4 ?Grade View: Grade I ?Tube type: Oral ?Tube size: 7.5 mm ?Number of attempts: 1 ?Airway Equipment and Method: Stylet and Oral airway ?Placement Confirmation: ETT inserted through vocal cords under direct vision, positive ETCO2 and breath sounds checked- equal and bilateral ?Secured at: 23 cm ?Tube secured with: Tape ?Dental Injury: Teeth and Oropharynx as per pre-operative assessment  ?Comments: 4 severely loose lower front teeth and 4 upper teeth intact post-intubation.  ? ? ? ? ?

## 2021-06-18 NOTE — Transfer of Care (Signed)
Immediate Anesthesia Transfer of Care Note ? ?Patient: Logan Nelson ? ?Procedure(s) Performed: MRI WITH ANESTHESIA ? ?Patient Location: PACU ? ?Anesthesia Type:General ? ?Level of Consciousness: lethargic and responds to stimulation ? ?Airway & Oxygen Therapy: Patient Spontanous Breathing and Patient connected to nasal cannula oxygen ? ?Post-op Assessment: Report given to RN ? ?Post vital signs: Reviewed and stable ? ?Last Vitals:  ?Vitals Value Taken Time  ?BP 107/83 06/18/21 1837  ?Temp 37.2 ?C 06/18/21 1837  ?Pulse 85 06/18/21 1841  ?Resp 18 06/18/21 1841  ?SpO2 96 % 06/18/21 1841  ?Vitals shown include unvalidated device data. ? ?Last Pain:  ?Vitals:  ? 06/18/21 1258  ?TempSrc: Oral  ?PainSc: 2   ?   ? ?  ? ?Complications: No notable events documented. ?

## 2021-06-18 NOTE — Plan of Care (Signed)
-  Pt with new ventral epidural abscess in C4-c5 and associated with discitis and new prevertebral fluid. Placed pt on q4h neuro checks and asked nurse to notify provideer ASAP with any acute changes. ?-Notified ID doctor on call of the abnormal MRI findings and also called the called the on-call number for neurosurgery and call center staff said will page on-call neurosurgeon. Awaiting call back. ? ?

## 2021-06-18 NOTE — Progress Notes (Signed)
PT Cancellation Note ? ?Patient Details ?Name: Logan Nelson ?MRN: [id number] ?DOB: [birth year] ? ? ?Cancelled Treatment:    Reason Eval/Treat Not Completed: Patient at procedure or test/unavailable (Pt leaving floor now for MRI with anesthesia.  PT to f/u as time permits.) ? ?Terence Googe A. Aleksia Freiman, PT, DPT ?Acute Rehabilitation Services ?Office: [phone]  ?Shaynah Hund A Nial Hawe ?06/18/2021, 12:25 PM ?

## 2021-06-18 NOTE — Progress Notes (Signed)
?PROGRESS NOTE ? ? ? ?Logan Nelson  RAX:[id number] DOB: [birth year] DOA: 06/16/2021 ?PCP: [name], MD  ? ? ?Brief Narrative:  ?56 year old gentleman with history of hypertension, anasarca, recently admitted for incarcerated abdominal hernia with primary repair  Who left AGAINST MEDICAL ADVICE before he was placed to a rehab.  He subsequently again admitted from 4/4-5/11 with severe debility and malnutrition and failure to thrive.  While he was in the hospital, developed MSSA bacteremia and sepsis, back pain. Was under treatment but left AMA 5/10 just to come back due to profound weakness and coming back to the hospital to complete treatment. ? ? ?Assessment & Plan: ?  ?MSSA bacteremia developed while in the hospital, primary source unknown. ?Blood cultures 5/8, MSSA ?Blood cultures 5/10, no growth so far ?Blood cultures 5/11, no growth so far. ?TTE negative for vegetation ?TEE scheduled for 5/15. ?Currently remains on Unasyn ?MRI T-spine, L-spine, sacral spine today under anesthesia. ?Adequate pain medications.  Followed by ID. ? ?Acute systolic congestive heart failure: ?Echocardiogram 3/23 with ejection fraction 60 to 65% ?Repeat echocardiogram with 30 to 35%.  Currently on IV Lasix.  We will continue.  Euvolemic. ?Once infection work-up is completed, will need ischemia evaluation.  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 ? ?Severe protein calorie malnutrition: Augment nutrition. ? ?Severe debility, frailty: Work with PT OT after MRI.  Refer to SNF. ? ? ?Pressure Injury 05/04/21 Buttocks Left Stage 2 -  Partial thickness loss of dermis presenting as a shallow open injury with a red, pink wound bed without slough. small dime sized skin tear (Active)  ?05/04/21 0800  ?Location: Buttocks  ?Location Orientation: Left  ?Staging: Stage 2 -  Partial thickness loss of dermis presenting as a shallow open injury with a red, pink wound bed without slough.  ?Wound  Description (Comments): small dime sized skin tear  ?Present on Admission: No  ?   ?Pressure Injury 06/17/21 Sacrum Stage 3 -  Full thickness tissue loss. Subcutaneous fat may be visible but bone, tendon or muscle are NOT exposed. 2 in vertical by .5 in horizontal--approximately--wound tunneling and pink (Active)  ?06/17/21 1159  ?Location: Sacrum  ?Location Orientation:   ?Staging: Stage 3 -  Full thickness tissue loss. Subcutaneous fat may be visible but bone, tendon or muscle are NOT exposed.  ?Wound Description (Comments): 2 in vertical by .5 in horizontal--approximately--wound tunneling and pink  ?Present on Admission:   ? ?Essential hypertension: Blood pressure. ? ?Anemia of chronic disease: 3 PRBC transfusion during previous hospitalization.  No active bleeding.  Continue to monitor. ? ? ?DVT prophylaxis: SCDs Start: 06/17/21 1144 ? ? ?Code Status: Full code ?Family Communication: None. ?Disposition Plan: Status is: Inpatient ?Remains inpatient appropriate because: Treatment for bacteremia.  Unsafe discharge. ?  ? ? ?Consultants:  ?Infectious disease ? ?Procedures:  ?None ? ?Antimicrobials:  ?Unasyn 5/11--- ? ? ?Subjective: ?Patient seen and examined.  Complains of pain in the neck and both leg weakness.  Afebrile.  Denies any nausea vomiting.  He tells me he cannot bear weight and walk with his legs.  Abdominal pain is not present now.  He thinks his belly is healed. ? ?Objective: ?Vitals:  ? 06/17/21 2026 06/18/21 0102 06/18/21 0600 06/18/21 0812  ?BP: (!) 125/97 113/87  (!) 142/98  ?Pulse: 96 91  93  ?Resp: 16 16  19   ?Temp: 98 ?F (36.7 ?C) 98.3 ?F (36.8 ?C)  97.6 ?F (36.4 ?C)  ?TempSrc: Oral Oral  Oral  ?SpO2: 100% 97%  99%  ?Weight:   67.3 kg   ?Height:   5' 5.98" (1.676 m)   ? ? ?Intake/Output Summary (Last 24 hours) at 06/18/2021 1215 ?Last data filed at 06/17/2021 1538 ?Gross per 24 hour  ?Intake 97.83 ml  ?Output --  ?Net 97.83 ml  ? ?Filed Weights  ? 06/18/21 0600  ?Weight: 67.3 kg   ? ? ?Examination: ? ?General exam: Appears calm and comfortable  ?Frail and debilitated.  Generalized weakness.  No focal deficits. ?Alert oriented x4.  Blunted mood and affect. ?Respiratory system: Clear to auscultation. Respiratory effort normal. ?Cardiovascular system: S1 & S2 heard, RRR.  ?Gastrointestinal system: Soft.  Nontender.  Midline surgical scars clean and dry. ?Central nervous system: Alert and oriented.  Global weakness. ?Extremities: Symmetric 5 x 5 power.  Globally weak. ?Skin:  ?Stage II left buttock ?Stage III sacral pressure wound.  Dressing on. ? ? ? ?Data Reviewed: I have personally reviewed following labs and imaging studies ? ?CBC: ?Recent Labs  ?Lab 06/13/21 ?1414 06/14/21 ?1655 06/16/21 ?0500 06/16/21 ?2159 06/18/21 ?0246  ?WBC 15.2* 13.1* 17.6* 14.5* 12.1*  ?NEUTROABS 12.8* 10.5*  --  10.8*  --   ?HGB 10.2* 9.2* 8.8* 9.2* 10.4*  ?HCT 32.0* 29.0* 27.4* 29.0* 32.6*  ?MCV 92.8 91.8 91.3 91.8 91.1  ?PLT 413* 351 338 346 445*  ? ?Basic Metabolic Panel: ?Recent Labs  ?Lab 06/12/21 ?0957 06/13/21 ?1414 06/14/21 ?1127 06/16/21 ?0500 06/16/21 ?2159 06/18/21 ?0246  ?NA 136 138 140 138 141 138  ?K 3.9 4.0 4.0 4.0 4.2 3.0*  ?CL 101 102 103 107 108 106  ?CO2 26 29 29 26 24 24   ?GLUCOSE 106* 116* 125* 124* 92 159*  ?BUN 17 23* 29* 33* 40* 31*  ?CREATININE 0.50* 0.57* 0.60* 0.64 0.78 0.74  ?CALCIUM 8.7* 8.8* 8.9 8.7* 9.0 8.2*  ?MG 1.4* 1.8 1.9 1.9  --   --   ? ?GFR: ?Estimated Creatinine Clearance: 90.8 mL/min (by C-G formula based on SCr of 0.74 mg/dL). ?Liver Function Tests: ?Recent Labs  ?Lab 06/13/21 ?1414 06/17/21 ?0421  ?AST 21 24  ?ALT 22 17  ?ALKPHOS 94 89  ?BILITOT 0.6 1.0  ?PROT 7.1 6.8  ?ALBUMIN 3.0* 2.8*  ? ?Recent Labs  ?Lab 06/17/21 ?0421  ?LIPASE 19  ? ?No results for input(s): AMMONIA in the last 168 hours. ?Coagulation Profile: ?No results for input(s): INR, PROTIME in the last 168 hours. ?Cardiac Enzymes: ?No results for input(s): CKTOTAL, CKMB, CKMBINDEX, TROPONINI in the last 168  hours. ?BNP (last 3 results) ?No results for input(s): PROBNP in the last 8760 hours. ?HbA1C: ?No results for input(s): HGBA1C in the last 72 hours. ?CBG: ?No results for input(s): GLUCAP in the last 168 hours. ?Lipid Profile: ?No results for input(s): CHOL, HDL, LDLCALC, TRIG, CHOLHDL, LDLDIRECT in the last 72 hours. ?Thyroid Function Tests: ?No results for input(s): TSH, T4TOTAL, FREET4, T3FREE, THYROIDAB in the last 72 hours. ?Anemia Panel: ?No results for input(s): VITAMINB12, FOLATE, FERRITIN, TIBC, IRON, RETICCTPCT in the last 72 hours. ?Sepsis Labs: ?Recent Labs  ?Lab 06/14/21 ?1655 06/15/21 ?0053 06/16/21 ?0500  ?PROCALCITON 1.03 1.34 5.33  ? ? ?Recent Results (from the past 240 hour(s))  ?Culture, blood (Routine X 2) w Reflex to ID Panel     Status: Abnormal  ? Collection Time: 06/14/21  4:45 PM  ? Specimen: BLOOD  ?Result Value Ref Range Status  ? Specimen Description BLOOD LEFT ANTECUBITAL  Final  ? Special Requests   Final  ?  BOTTLES DRAWN AEROBIC AND ANAEROBIC Blood Culture results may not be optimal due to an inadequate volume of blood received in culture bottles  ? Culture  Setup Time   Final  ?  GRAM POSITIVE COCCI IN CLUSTERS ?IN BOTH AEROBIC AND ANAEROBIC BOTTLES ?CRITICAL VALUE NOTED.  VALUE IS CONSISTENT WITH PREVIOUSLY REPORTED AND CALLED VALUE. ?  ? Culture (A)  Final  ?  STAPHYLOCOCCUS AUREUS ?SUSCEPTIBILITIES PERFORMED ON PREVIOUS CULTURE WITHIN THE LAST 5 DAYS. ?Performed at [facility], [place] [address], [place], [place] [address] ?  ? Report Status 06/17/2021 FINAL  Final  ?Culture, blood (Routine X 2) w Reflex to ID Panel     Status: Abnormal  ? Collection Time: 06/14/21  4:55 PM  ? Specimen: BLOOD  ?Result Value Ref Range Status  ? Specimen Description BLOOD RIGHT ANTECUBITAL  Final  ? Special Requests   Final  ?  BOTTLES DRAWN AEROBIC AND ANAEROBIC Blood Culture adequate volume  ? Culture  Setup Time   Final  ?  GRAM POSITIVE COCCI IN CLUSTERS ?IN BOTH AEROBIC AND ANAEROBIC  BOTTLES ?CRITICAL RESULT CALLED TO, READ BACK BY AND VERIFIED WITH: PHARM D P.VEGA ON DE:[id number] AT 0825 BY E.PARRISH ?Performed at [facility], [place] [address] [place], [place] [address] ?  ? Culture STAPHYLOCOCCUS AUREUS (A)  Fin

## 2021-06-18 NOTE — Progress Notes (Signed)
OT Cancellation Note ? ?Patient Details ?Name: Logan Nelson ?MRN: [id number] ?DOB: 03/27/1965 ? ? ?Cancelled Treatment:    Reason Eval/Treat Not Completed: Patient at procedure or test/ unavailable ? ?Mateo Flow ?06/18/2021, 2:58 PM ?

## 2021-06-18 NOTE — Anesthesia Postprocedure Evaluation (Signed)
Anesthesia Post Note ? ?Patient: Logan Nelson ? ?Procedure(s) Performed: MRI WITH ANESTHESIA ? ?  ? ?Patient location during evaluation: PACU ?Anesthesia Type: General ?Level of consciousness: awake ?Pain management: pain level controlled ?Vital Signs Assessment: post-procedure vital signs reviewed and stable ?Respiratory status: spontaneous breathing, nonlabored ventilation, respiratory function stable and patient connected to nasal cannula oxygen ?Cardiovascular status: blood pressure returned to baseline and stable ?Postop Assessment: no apparent nausea or vomiting ?Anesthetic complications: no ? ? ?No notable events documented. ? ?Last Vitals:  ?Vitals:  ? 06/18/21 1925 06/18/21 1954  ?BP:  118/89  ?Pulse: 86 84  ?Resp: (!) 21 18  ?Temp: 37.1 ?C 36.6 ?C  ?SpO2: 93% 100%  ?  ?Last Pain:  ?Vitals:  ? 06/18/21 1954  ?TempSrc: Oral  ?PainSc: 0-No pain  ? ? ?  ?  ?  ?  ?  ?  ? ?Lolah Coghlan P Summers Buendia ? ? ? ? ?

## 2021-06-19 ENCOUNTER — Encounter (HOSPITAL_COMMUNITY): Payer: Self-pay | Admitting: Radiology

## 2021-06-19 ENCOUNTER — Inpatient Hospital Stay (HOSPITAL_COMMUNITY): Payer: Medicaid Other

## 2021-06-19 DIAGNOSIS — M50123 Cervical disc disorder at C6-C7 level with radiculopathy: Secondary | ICD-10-CM | POA: Diagnosis not present

## 2021-06-19 DIAGNOSIS — B9561 Methicillin susceptible Staphylococcus aureus infection as the cause of diseases classified elsewhere: Secondary | ICD-10-CM | POA: Diagnosis not present

## 2021-06-19 DIAGNOSIS — J90 Pleural effusion, not elsewhere classified: Secondary | ICD-10-CM | POA: Diagnosis not present

## 2021-06-19 DIAGNOSIS — M4802 Spinal stenosis, cervical region: Secondary | ICD-10-CM | POA: Diagnosis not present

## 2021-06-19 DIAGNOSIS — L0211 Cutaneous abscess of neck: Secondary | ICD-10-CM | POA: Diagnosis not present

## 2021-06-19 DIAGNOSIS — R7881 Bacteremia: Secondary | ICD-10-CM | POA: Diagnosis not present

## 2021-06-19 DIAGNOSIS — M50122 Cervical disc disorder at C5-C6 level with radiculopathy: Secondary | ICD-10-CM | POA: Diagnosis not present

## 2021-06-19 DIAGNOSIS — G061 Intraspinal abscess and granuloma: Secondary | ICD-10-CM | POA: Diagnosis not present

## 2021-06-19 DIAGNOSIS — M4642 Discitis, unspecified, cervical region: Secondary | ICD-10-CM | POA: Diagnosis not present

## 2021-06-19 IMAGING — CT CT CERVICAL SPINE W/O CM
4 series · 14 of 33 positions shown, 16 images · non-contrast
Comparison: Cervical MRI from yesterday

CLINICAL DATA: Cervical radiculopathy with infection suspected.
Evaluate quality of vertebral body bone due to bony infection.



[Series 5: orthogonal axials · axial · 0.23mm/px · z∈[-283,-192]mm · 4 of 92 slices shown, 5 images]
[im 19/92  soft-tissue]
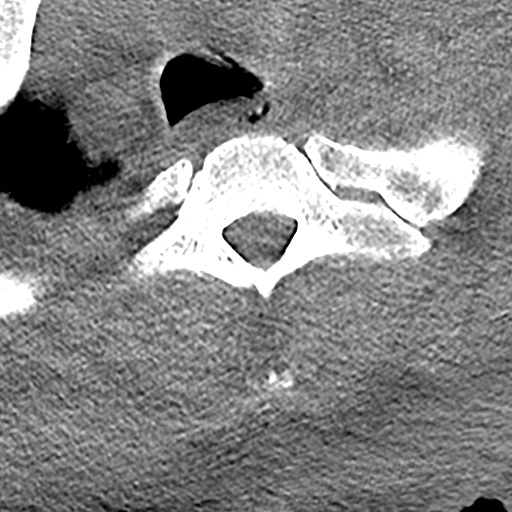
[im 19/92  bone]
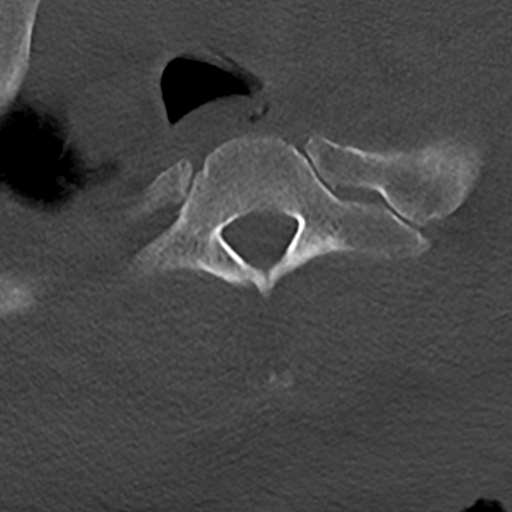
[im 37/92  bone]
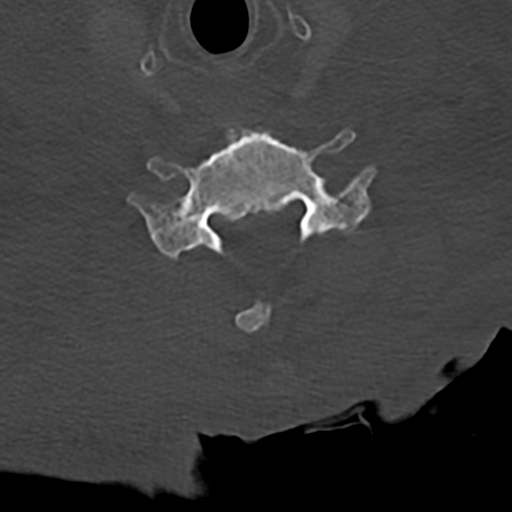
[im 55/92  bone]
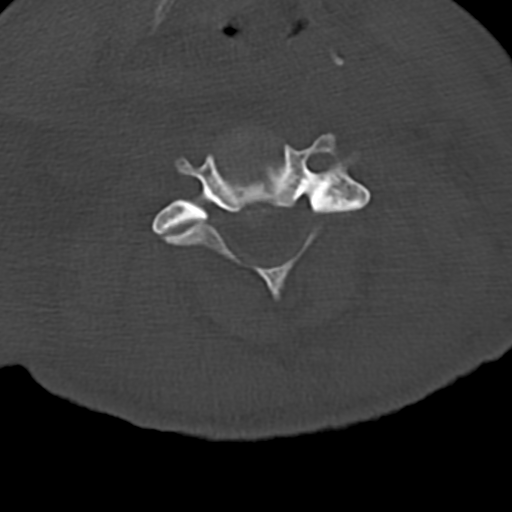
[im 73/92  bone]
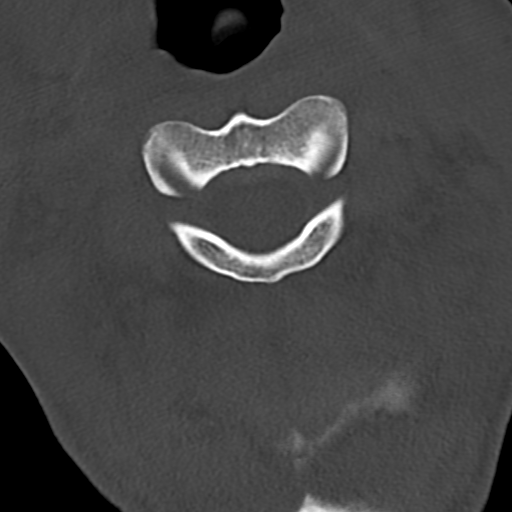

[Series 7: c spine soft · axial · 0.35mm/px · z∈[-266,-236]mm · 2 of 108 slices shown]
[im 16/108  soft-tissue]
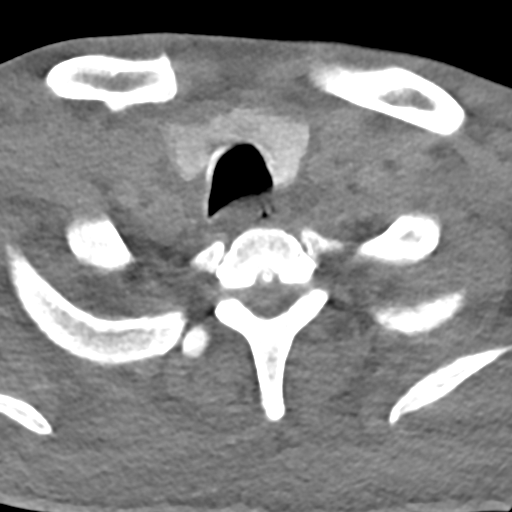
[im 31/108  soft-tissue]
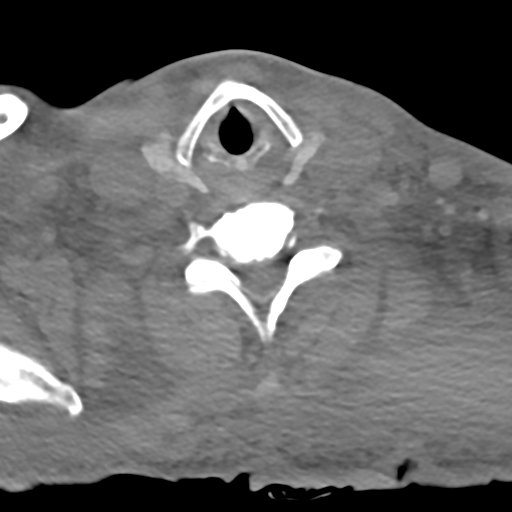

[Series 10: sag bone · sagittal · 0.33mm/px · 5 of 49 slices shown, 6 images]
[im 17/49  bone]
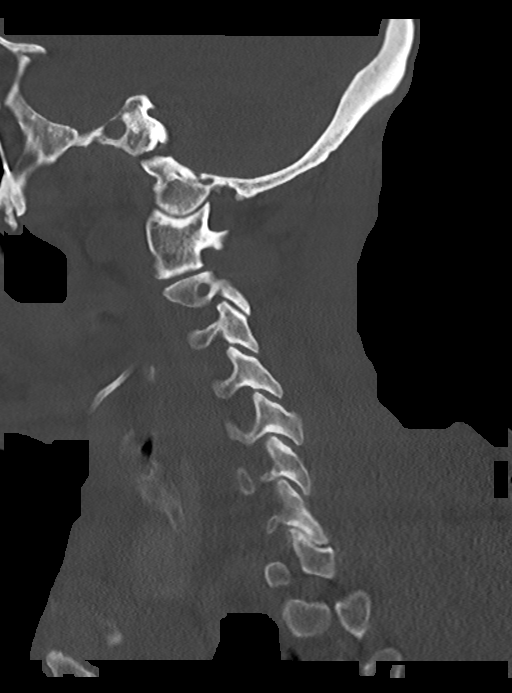
[im 21/49  bone]
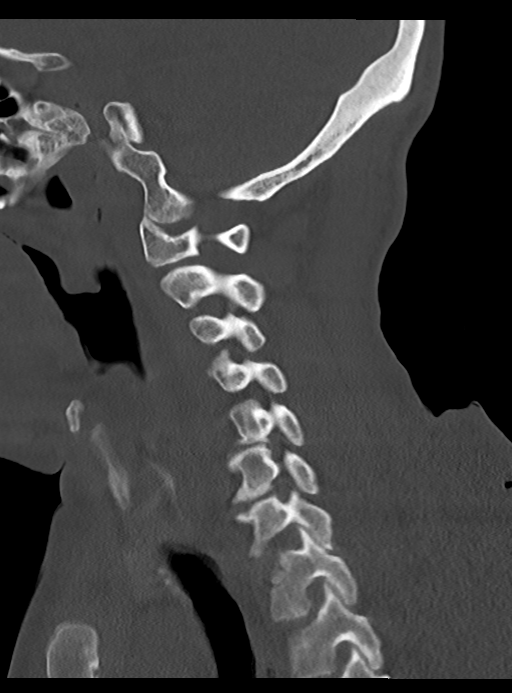
[im 25/49  soft-tissue]
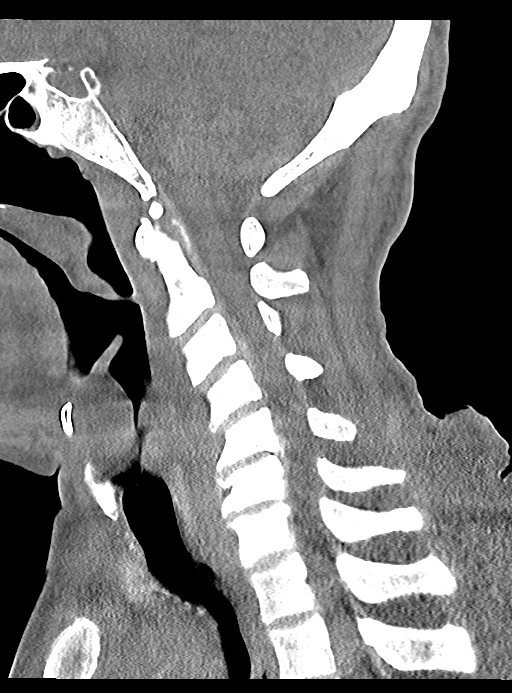
[im 25/49  bone]
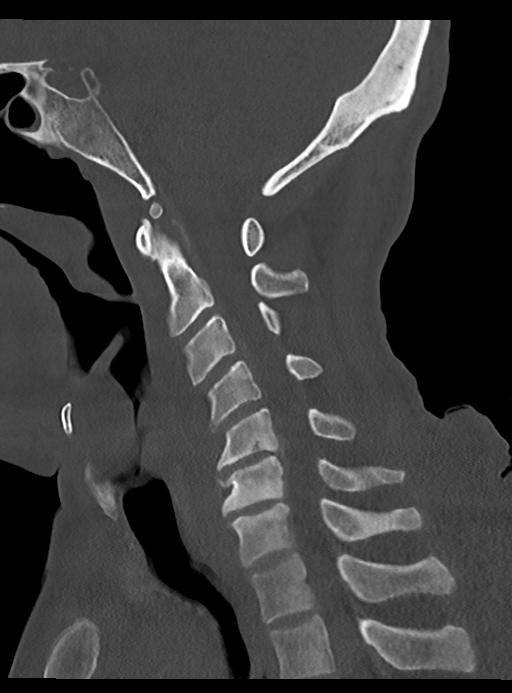
[im 29/49  bone]
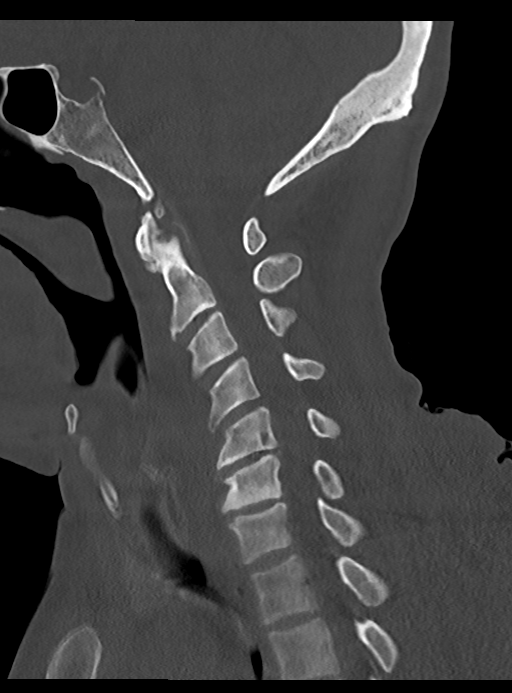
[im 33/49  bone]
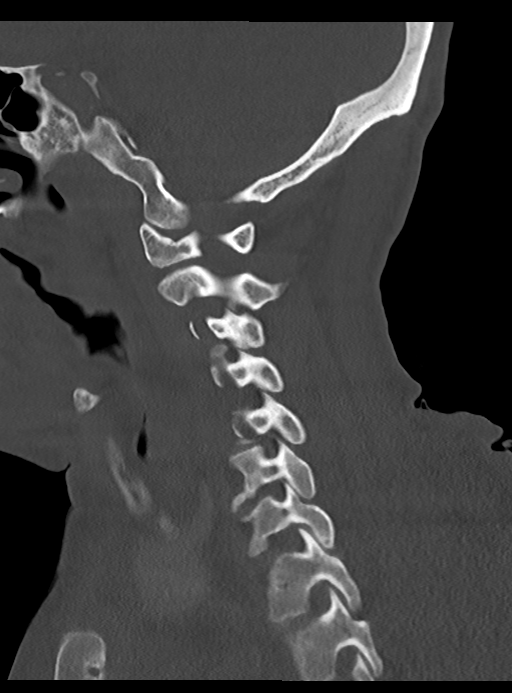

[Series 11: cor bone · coronal · 0.35mm/px · 3 of 46 slices shown]
[im 10/46  bone]
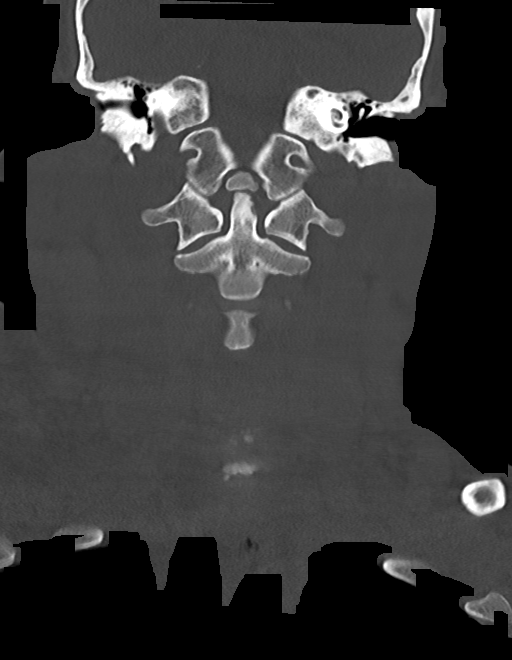
[im 19/46  bone]
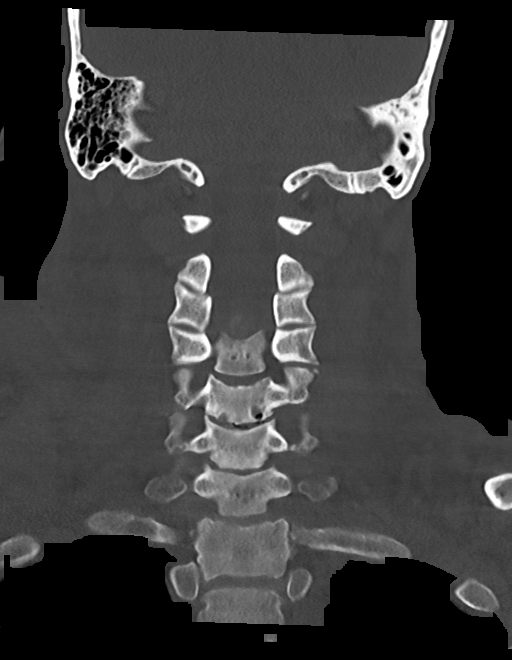
[im 28/46  bone]
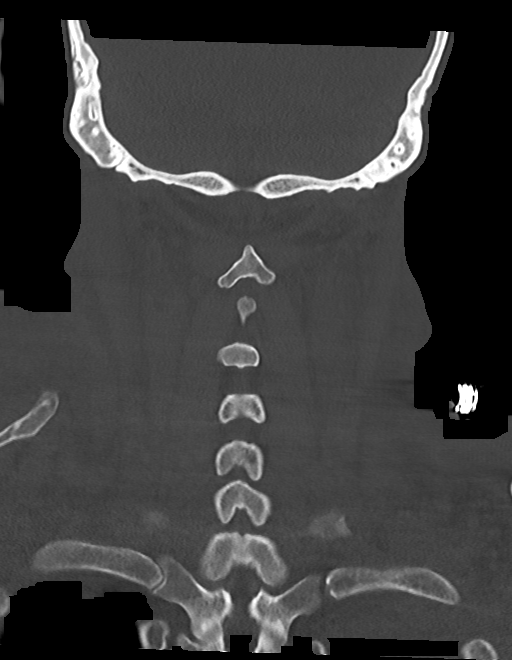

[14 of 33 positions shown; findings below may reference images not displayed]

FINDINGS: Alignment: Reversal of cervical lordosis without listhesis.

Skull base and vertebrae: Negative for acute fracture. Subtle
endplate erosion at the infected C4-5 disc level, eccentric towards
the left uncovertebral joint. No fracture or bony destruction.

Soft tissues and spinal canal: Paravertebral edema. Underestimated
fluid collections compared to prior MRI.

Disc levels: Disc space infection as noted above. Disc space
narrowing with endplate degeneration at C5-6 and C6-7.

Upper chest: Left pleural effusion.
IMPRESSION: Discitis/osteomyelitis with abscess at C4-5 that is underestimated
relative to prior MRI. No fracture or significant bony destruction.

## 2021-06-19 NOTE — Progress Notes (Signed)
Mobility Specialist Progress Note: ? ? 06/19/21 1139  ?Mobility  ?Activity Ambulated with assistance in hallway  ?Level of Assistance Minimal assist, patient does 75% or more  ?Assistive Device Front wheel walker  ?Distance Ambulated (ft) 300 ft  ?Activity Response Tolerated well  ?$Mobility charge 1 Mobility  ? ?Pt received in chair willing to participate in mobility. No complaints of pain. Left in chair with call bell in reach and all needs met.  ? ?Logan Nelson ?Mobility Specialist ?Primary Phone [phone] ? ?

## 2021-06-19 NOTE — Progress Notes (Signed)
Subjective: ?Patient reports no neck pain, resolved headache, no arm pain, no change in the chronic weakness and numbness of the right arm that he states has been there for about 2 years since some type of accident.  He denies pain with swallowing ? ?Objective: ?Vital signs in last 24 hours: ?Temp:  [97.6 ?F (36.4 ?C)-99 ?F (37.2 ?C)] 97.9 ?F (36.6 ?C) (05/13 0522) ?Pulse Rate:  [79-93] 80 (05/13 0522) ?Resp:  [11-22] 18 (05/13 0522) ?BP: (106-142)/(68-99) 109/68 (05/13 0522) ?SpO2:  [89 %-100 %] 100 % (05/13 0522) ? ?Intake/Output from previous day: ?05/12 0701 - 05/13 0700 ?In: 950 [I.V.:600; IV Piggyback:350] ?Out: -  ?Intake/Output this shift: ?No intake/output data recorded. ? ?On exam he is awake and alert but he is a very poor historian, he moves all 4 extremities that appears to be stable compared to previous exams dating back to early April, his strength in the right upper extremity 3 to 4- out of 5, his left upper extremity is now 5 out of 5 in all muscle groups.  He has good dexterity and is using his hands to feed himself and use the remote control. ? ?Lab Results: ?Lab Results  ?Component Value Date  ? WBC 12.1 (H) 06/18/2021  ? HGB 10.4 (L) 06/18/2021  ? HCT 32.6 (L) 06/18/2021  ? MCV 91.1 06/18/2021  ? PLT 445 (H) 06/18/2021  ? ?Lab Results  ?Component Value Date  ? INR 1.1 05/10/2021  ? ?BMET ?Lab Results  ?Component Value Date  ? NA 138 06/18/2021  ? K 3.0 (L) 06/18/2021  ? CL 106 06/18/2021  ? CO2 24 06/18/2021  ? GLUCOSE 159 (H) 06/18/2021  ? BUN 31 (H) 06/18/2021  ? CREATININE 0.74 06/18/2021  ? CALCIUM 8.2 (L) 06/18/2021  ? ? ?Studies/Results: ?CT CERVICAL SPINE WO CONTRAST ? ?Result Date: 06/19/2021 ?CLINICAL DATA:  Cervical radiculopathy with infection suspected. Evaluate quality of vertebral body bone due to bony infection. EXAM: CT CERVICAL SPINE WITHOUT CONTRAST TECHNIQUE: Multidetector CT imaging of the cervical spine was performed without intravenous contrast. Multiplanar CT image  reconstructions were also generated. RADIATION DOSE REDUCTION: This exam was performed according to the departmental dose-optimization program which includes automated exposure control, adjustment of the mA and/or kV according to patient size and/or use of iterative reconstruction technique. COMPARISON:  Cervical MRI from yesterday FINDINGS: Alignment: Reversal of cervical lordosis without listhesis. Skull base and vertebrae: Negative for acute fracture. Subtle endplate erosion at the infected C4-5 disc level, eccentric towards the left uncovertebral joint. No fracture or bony destruction. Soft tissues and spinal canal: Paravertebral edema. Underestimated fluid collections compared to prior MRI. Disc levels: Disc space infection as noted above. Disc space narrowing with endplate degeneration at C5-6 and C6-7. Upper chest: Left pleural effusion. IMPRESSION: Discitis/osteomyelitis with abscess at C4-5 that is underestimated relative to prior MRI. No fracture or significant bony destruction. Electronically Signed   By: Tiburcio Pea M.D.   On: 06/19/2021 06:29  ? ?MR CERVICAL SPINE WO CONTRAST ? ?Result Date: 06/18/2021 ?CLINICAL DATA:  Cervical osteomyelitis.  MSSA bacteremia. EXAM: MRI CERVICAL SPINE WITHOUT CONTRAST TECHNIQUE: Multiplanar, multisequence MR imaging of the cervical spine was performed. No intravenous contrast was administered. COMPARISON:  Cervical spine MRI 05/11/2021 FINDINGS: Alignment: Straightening/mild reversal of the normal cervical lordosis. No significant listhesis. Vertebrae: Diffusely diminished bone marrow T1 signal intensity likely related to known anemia. No fracture. New midline ventral epidural fluid collection extending from the C4 superior to C5 inferior endplates and measuring 5 x 10  x 30 mm (AP x transverse x craniocaudal). This likely extends from the C4-5 disc space which is mildly narrowed and demonstrates areas of fluid signal, most notably anteriorly. Similar appearance of  endplate changes at C5-6 and C6-7 compared to the prior MRI which may be degenerative. Cord: Moderate cord flattening at C4-5 by the epidural collection. No definite cord edema. Posterior Fossa, vertebral arteries, paraspinal tissues: New prevertebral fluid which measures up to 1.1 cm in AP thickness at the C2-3 level. Diffuse soft tissue edema throughout the neck likely reflecting anasarca. Chronic pontine infarct. Disc levels: Moderate to severe spinal stenosis at C4-5 due to the epidural collection. IMPRESSION: 1. New ventral epidural fluid collection at C4-5 compatible with an abscess and associated with discitis and a new prevertebral fluid collection. Result in moderate to severe spinal stenosis. No cord edema. 2. Anasarca. These results will be called to the ordering clinician or representative by the Radiologist Assistant, and communication documented in the PACS or [employer]. Electronically Signed   By: Sebastian Ache M.D.   On: 06/18/2021 20:02  ? ?MR THORACIC SPINE WO CONTRAST ? ?Result Date: 06/18/2021 ?CLINICAL DATA:  Thoracic osteomyelitis. MSSA bacteremia. EXAM: MRI THORACIC SPINE WITHOUT CONTRAST TECHNIQUE: Multiplanar, multisequence MR imaging of the thoracic spine was performed. No intravenous contrast was administered. COMPARISON:  Thoracic spine radiographs 06/17/2021. Thoracic spine MRI 05/11/2021. Chest CT 05/10/2021. FINDINGS: Alignment:  Mild scoliosis.  No listhesis. Vertebrae: Diffusely diminished bone marrow T1 signal intensity likely related to known anemia. Similar appearance scattered foci of T2 hyperintensity along multiple thoracic endplates compared to the prior MRI, likely degenerative. No definite findings of discitis or epidural abscess. Persistent right-sided facet edema at T4-5, favored to be degenerative given lack of gross interval osseous destruction, no significant joint fluid, and appearance on the prior CT. Cord:  Normal cord signal and morphology. Paraspinal and other  soft tissues: Small bilateral pleural effusions. Diffuse body wall edema. Disc levels: Similar appearance of mild multilevel thoracic disc degeneration without compressive stenosis. IMPRESSION: 1. No definite evidence of thoracic spine infection. 2. Persistent right-sided facet edema at T4-5, favored to be degenerative. 3. Small bilateral pleural effusions and diffuse body wall edema/anasarca. Electronically Signed   By: Sebastian Ache M.D.   On: 06/18/2021 19:50  ? ?MR LUMBAR SPINE WO CONTRAST ? ?Result Date: 06/18/2021 ?CLINICAL DATA:  Lumbar osteomyelitis.  MSSA bacteremia. EXAM: MRI LUMBAR SPINE WITHOUT CONTRAST TECHNIQUE: Multiplanar, multisequence MR imaging of the lumbar spine was performed. No intravenous contrast was administered. COMPARISON:  Lumbar spine radiographs 06/17/2021. CT abdomen and pelvis 06/01/2021. FINDINGS: Image quality is degraded by anasarca and ascites. Segmentation: Standard. Alignment:  Normal. Vertebrae: Diffusely diminished bone marrow T1 signal intensity likely related to known anemia. Advanced disc space narrowing at L5-S1 with a small amount of fluid signal in the disc space where vacuum disc phenomenon was present on the prior CT, favored to be degenerative and without gross interval endplate destruction to clearly indicate discitis/osteomyelitis. No fracture. Conus medullaris and cauda equina: Conus extends to the T12-L1 level. Conus and cauda equina appear normal. Paraspinal and other soft tissues: Anasarca. No drainable paraspinal fluid collection. Disc levels: L1-2: Minimal leftward disc bulging and mild facet arthrosis without significant stenosis. L2-3: Mild disc bulging and mild facet arthrosis without significant stenosis. L3-4: Mild left eccentric disc bulging and mild facet arthrosis without significant stenosis. L4-5: Mild disc desiccation and mild disc space narrowing. Disc bulging, a left foraminal disc protrusion, and mild facet arthrosis result in mild  spinal  stenosis, mild-to-moderate bilateral lateral recess stenosis, and moderate bilateral neural foraminal stenosis. L5-S1: Severe disc space narrowing. Disc bulging, endplate spurring, and moderate facet arthrosis result

## 2021-06-19 NOTE — Progress Notes (Signed)
Mobility Specialist Progress Note: ? ? 06/19/21 1435  ?Mobility  ?Activity Transferred from chair to bed  ?Level of Assistance Minimal assist, patient does 75% or more  ?Assistive Device  ?(HHA)  ?Distance Ambulated (ft) 2 ft  ?Activity Response Tolerated well  ?$Mobility charge 1 Mobility  ? ?Pt received sitting of foot part of chair asking to go back to bed. Complaints of back pain. MinA to stand and step to bed. Left in bed with call bell in reach and all needs met.  ? ?Logan Nelson ?Mobility Specialist ?Primary Phone [phone] ? ?

## 2021-06-19 NOTE — Progress Notes (Signed)
?PROGRESS NOTE ? ? ? ?Logan Nelson  ZOX:[id number] DOB: 07/28/1965 DOA: 06/16/2021 ?PCP: Georganna SkeansWilson, Amelia, MD  ? ? ?Brief Narrative:  ?56 year old gentleman with history of hypertension, anasarca, recently admitted for incarcerated abdominal hernia with primary repair  Who left AGAINST MEDICAL ADVICE before he was placed to a rehab.  He subsequently again admitted from 4/4-5/11 with severe debility and malnutrition and failure to thrive.  While he was in the hospital, developed MSSA bacteremia and sepsis, back pain. Was under treatment but left AMA 5/10 just to come back due to profound weakness and now decided he will complete treatment. ? ? ?Assessment & Plan: ?  ?MSSA bacteremia developed while in the hospital, primary source unknown.  Likely sacral decubitus ulcer not present on admission. ?Blood cultures 5/8, MSSA ?Blood cultures 5/10, no growth so far ?Blood cultures 5/11, no growth so far. ?TTE negative for vegetation ?TEE scheduled for 5/15. ?Currently remains on Unasyn ?MRI T-spine, L-spine, sacral spine with multilevel osteomyelitis, cervical abscess but no central cord compression.  Seen by neurosurgery and recommended no surgical management. ?Adequate pain medications.  Followed by ID. ?Anticipate prolonged IV antibiotics 6 to 8 weeks. ? ?Acute systolic congestive heart failure: ?Echocardiogram 3/23 with ejection fraction 60 to 65% ?Repeat echocardiogram with 30 to 35%.  Currently on IV Lasix.  Will change to oral Lasix.  Euvolemic. ?Once infection work-up is completed, will need ischemia evaluation.  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 ?We will see ejection fraction with echocardiogram that is scheduled for 5/15. ? ?Severe protein calorie malnutrition: Augment nutrition. ? ?Severe debility, frailty: Work with PT OT after MRI.  Refer to SNF. ? ? ?Pressure Injury 06/17/21 Sacrum Stage 3 -  Full thickness tissue loss. Subcutaneous fat may be visible but  bone, tendon or muscle are NOT exposed. 2 in vertical by .5 in horizontal--approximately--wound tunneling and pink (Active)  ?06/17/21 1159  ?Location: Sacrum  ?Location Orientation:   ?Staging: Stage 3 -  Full thickness tissue loss. Subcutaneous fat may be visible but bone, tendon or muscle are NOT exposed.  ?Wound Description (Comments): 2 in vertical by .5 in horizontal--approximately--wound tunneling and pink  ?Present on Admission:   ? ?Essential hypertension: Blood pressure are stable. ? ?Anemia of chronic disease: 3 PRBC transfusion during previous hospitalization.  No active bleeding.  Continue to monitor. ? ? ?DVT prophylaxis: SCDs Start: 06/17/21 1144 ? ? ?Code Status: Full code ?Family Communication: None. ?Disposition Plan: Status is: Inpatient ?Remains inpatient appropriate because: Treatment for bacteremia.  Unsafe discharge. ?  ? ? ?Consultants:  ?Infectious disease ? ?Procedures:  ?None ? ?Antimicrobials:  ?Unasyn 5/11--- ? ? ?Subjective: ?Patient seen and examined.  Denies any complaints.  Upper back pain with some improvement with oxycodone and lidocaine patch. ?He is agreeable to go to rehab, he tells me that he has no place to go after completing rehab.  Able to get out of the bed with nursing help. ?Denies any difficulty swallowing or neck swelling. ? ?Objective: ?Vitals:  ? 06/18/21 1954 06/19/21 0000 06/19/21 0522 06/19/21 0753  ?BP: 118/89 120/90 109/68 (!) 115/95  ?Pulse: 84 82 80 78  ?Resp: 18 19 18 18   ?Temp: 97.9 ?F (36.6 ?C) 98 ?F (36.7 ?C) 97.9 ?F (36.6 ?C) 97.7 ?F (36.5 ?C)  ?TempSrc: Oral Oral  Oral  ?SpO2: 100% 95% 100% 99%  ?Weight:      ?Height:      ? ? ?Intake/Output Summary (Last 24 hours) at  06/19/2021 1115 ?Last data filed at 06/19/2021 0930 ?Gross per 24 hour  ?Intake 1190 ml  ?Output --  ?Net 1190 ml  ? ?Filed Weights  ? 06/18/21 0600  ?Weight: 67.3 kg  ? ? ?Examination: ? ?General exam: Appears calm and comfortable.  Frail and debilitated.  Chronically sick looking. ?Alert  oriented x4.  Blunted mood and affect. ?Respiratory system: Clear to auscultation. Respiratory effort normal. ?Cardiovascular system: S1 & S2 heard, RRR.  ?Gastrointestinal system: Soft.  Nontender.  Midline surgical scars clean and dry. ?Central nervous system: Alert and oriented.  Global weakness. ?Extremities: Right upper extremity weaker than left and that is mostly chronic.  Globally weak. ?Skin:  ?Stage II left buttock ?Stage III sacral pressure wound.  Dressing on.  Clean and dry. ? ? ? ?Data Reviewed: I have personally reviewed following labs and imaging studies ? ?CBC: ?Recent Labs  ?Lab 06/13/21 ?1414 06/14/21 ?1655 06/16/21 ?0500 06/16/21 ?2159 06/18/21 ?0246  ?WBC 15.2* 13.1* 17.6* 14.5* 12.1*  ?NEUTROABS 12.8* 10.5*  --  10.8*  --   ?HGB 10.2* 9.2* 8.8* 9.2* 10.4*  ?HCT 32.0* 29.0* 27.4* 29.0* 32.6*  ?MCV 92.8 91.8 91.3 91.8 91.1  ?PLT 413* 351 338 346 445*  ? ?Basic Metabolic Panel: ?Recent Labs  ?Lab 06/13/21 ?1414 06/14/21 ?1127 06/16/21 ?0500 06/16/21 ?2159 06/18/21 ?0246  ?NA 138 140 138 141 138  ?K 4.0 4.0 4.0 4.2 3.0*  ?CL 102 103 107 108 106  ?CO2 29 29 26 24 24   ?GLUCOSE 116* 125* 124* 92 159*  ?BUN 23* 29* 33* 40* 31*  ?CREATININE 0.57* 0.60* 0.64 0.78 0.74  ?CALCIUM 8.8* 8.9 8.7* 9.0 8.2*  ?MG 1.8 1.9 1.9  --   --   ? ?GFR: ?Estimated Creatinine Clearance: 90.8 mL/min (by C-G formula based on SCr of 0.74 mg/dL). ?Liver Function Tests: ?Recent Labs  ?Lab 06/13/21 ?1414 06/17/21 ?0421  ?AST 21 24  ?ALT 22 17  ?ALKPHOS 94 89  ?BILITOT 0.6 1.0  ?PROT 7.1 6.8  ?ALBUMIN 3.0* 2.8*  ? ?Recent Labs  ?Lab 06/17/21 ?0421  ?LIPASE 19  ? ?No results for input(s): AMMONIA in the last 168 hours. ?Coagulation Profile: ?No results for input(s): INR, PROTIME in the last 168 hours. ?Cardiac Enzymes: ?No results for input(s): CKTOTAL, CKMB, CKMBINDEX, TROPONINI in the last 168 hours. ?BNP (last 3 results) ?No results for input(s): PROBNP in the last 8760 hours. ?HbA1C: ?No results for input(s): HGBA1C in the  last 72 hours. ?CBG: ?Recent Labs  ?Lab 06/18/21 ?2014  ?GLUCAP 120*  ? ?Lipid Profile: ?No results for input(s): CHOL, HDL, LDLCALC, TRIG, CHOLHDL, LDLDIRECT in the last 72 hours. ?Thyroid Function Tests: ?No results for input(s): TSH, T4TOTAL, FREET4, T3FREE, THYROIDAB in the last 72 hours. ?Anemia Panel: ?No results for input(s): VITAMINB12, FOLATE, FERRITIN, TIBC, IRON, RETICCTPCT in the last 72 hours. ?Sepsis Labs: ?Recent Labs  ?Lab 06/14/21 ?1655 06/15/21 ?0053 06/16/21 ?0500  ?PROCALCITON 1.03 1.34 5.33  ? ? ?Recent Results (from the past 240 hour(s))  ?Culture, blood (Routine X 2) w Reflex to ID Panel     Status: Abnormal  ? Collection Time: 06/14/21  4:45 PM  ? Specimen: BLOOD  ?Result Value Ref Range Status  ? Specimen Description BLOOD LEFT ANTECUBITAL  Final  ? Special Requests   Final  ?  BOTTLES DRAWN AEROBIC AND ANAEROBIC Blood Culture results may not be optimal due to an inadequate volume of blood received in culture bottles  ? Culture  Setup Time   Final  ?  GRAM POSITIVE COCCI IN CLUSTERS ?IN BOTH AEROBIC AND ANAEROBIC BOTTLES ?CRITICAL VALUE NOTED.  VALUE IS CONSISTENT WITH PREVIOUSLY REPORTED AND CALLED VALUE. ?  ? Culture (A)  Final  ?  STAPHYLOCOCCUS AUREUS ?SUSCEPTIBILITIES PERFORMED ON PREVIOUS CULTURE WITHIN THE LAST 5 DAYS. ?Performed at [facility], [place] [address], [place], [place] [address] ?  ? Report Status 06/17/2021 FINAL  Final  ?Culture, blood (Routine X 2) w Reflex to ID Panel     Status: Abnormal  ? Collection Time: 06/14/21  4:55 PM  ? Specimen: BLOOD  ?Result Value Ref Range Status  ? Specimen Description BLOOD RIGHT ANTECUBITAL  Final  ? Special Requests   Final  ?  BOTTLES DRAWN AEROBIC AND ANAEROBIC Blood Culture adequate volume  ? Culture  Setup Time   Final  ?  GRAM POSITIVE COCCI IN CLUSTERS ?IN BOTH AEROBIC AND ANAEROBIC BOTTLES ?CRITICAL RESULT CALLED TO, READ BACK BY AND VERIFIED WITH: PHARM D P.VEGA ON DE:[id number] AT 0825 BY E.PARRISH ?Performed at [facility], [place] [address], [place], [place] [address] ?  ? Culture STAPHYLOCOCCUS AUREUS (A)  Final  ? Report Status 06/17/2021 FINAL  Final  ? Organism ID, Bacteria STAPHYLOCOCCUS AUREUS  Final  ?    Susceptibilit

## 2021-06-19 NOTE — Progress Notes (Addendum)
? ? ?  Was asked to obtain consent for this patient's TEE on Monday but upon review of his chart, he is noted to have a retropharyngeal abscess and is pending ENT evaluation.  I reviewed with Dr. Flora Lipps and would not proceed with TEE until cleared to do so by ENT given this. Hospitalist made aware. Will send a message to the Card Master to follow-up on Monday as we may need to reschedule his TEE for later in the week.  ? ?Signed, ?Ellsworth Lennox, PA-C ?06/19/2021, 3:55 PM ? ? ?Was made aware by the Hospitalist team that ENT said there was no airway compression so should be cleared to proceed. I will review with our team tomorrow and follow-up on recommendations and obtain consent.  ? ?Signed, ?Ellsworth Lennox, PA-C ?06/19/2021, 4:53 PM ?Pager: [phone] ? ?

## 2021-06-19 NOTE — Evaluation (Signed)
Physical Therapy Evaluation ?Patient Details ?Name: Logan Nelson ?MRN: [id number] ?DOB: 1965-11-11 ?Today's Date: 06/19/2021 ? ?History of Present Illness ? Pt is 56 yr old M admitted on 06/16/21 with c/o weakness and back pain.  Recent admissions for hernia repair and B LE weakness/falls where pt has left AMA both times.  Imaging (+) for C4-5 abcess and OM of coccyx.  No surgical intervention planned at this time. PMH: anemia, arthritis, bradycardia, falls, HTN  ?Clinical Impression ? Pt is poor historian but appears to be homeless.  Has had multiple readmission where he has left AMA, fallen, and had to return to hospital.  Currently, pt is requiring mod A for bed mobility and refusing any additional functional mobility due to c/o pain and amb earlier with mobility team.  Per mobility team, able to walk in halls with min A and use of RW.  Pt demos dec LE strength, difficulty with bed mobility and dec activity tolerance and would benefit from skilled PT in acute care to address deficits indicated and progress to OOB activity to patient tolerance.  Pt would benefit from SNF placement upon D/C to address strength and balance deficits to prevent further readmissions.   ?   ? ?Recommendations for follow up therapy are one component of a multi-disciplinary discharge planning process, led by the attending physician.  Recommendations may be updated based on patient status, additional functional criteria and insurance authorization. ? ?Follow Up Recommendations Skilled nursing-short term rehab (<3 hours/day) ? ?  ?Assistance Recommended at Discharge Frequent or constant Supervision/Assistance  ?Patient can return home with the following ? A lot of help with walking and/or transfers;A lot of help with bathing/dressing/bathroom;Assistance with cooking/housework;Assist for transportation;Help with stairs or ramp for entrance ? ?  ?Equipment Recommendations  (Poor historian but it sounds like he has RW; w/c is in room)   ?Recommendations for Other Services ?    ?  ?Functional Status Assessment Patient has had a recent decline in their functional status and/or demonstrates limited ability to make significant improvements in function in a reasonable and predictable amount of time  ? ?  ?Precautions / Restrictions Precautions ?Precautions: Fall ?Restrictions ?Weight Bearing Restrictions: No  ? ?  ? ?Mobility ? Bed Mobility ?Overal bed mobility: Needs Assistance ?  ?  ?  ?  ?Sit to supine: Mod assist ?  ?General bed mobility comments: Pt req's mod A to transition R LE back into bed.  Unable to position self in bed needing max A assist with chuck pad to scoot pelvis.  Refuses any further mobility ?  ? ?Transfers ?  ?  ?  ?  ?  ?  ?  ?  ?  ?  ?  ? ?Ambulation/Gait ?  ?  ?  ?  ?  ?  ?  ?  ? ?Stairs ?  ?  ?  ?  ?  ? ?Wheelchair Mobility ?  ? ?Modified Rankin (Stroke Patients Only) ?  ? ?  ? ?Balance   ?  ?  ?  ?  ?  ?  ?  ?  ?  ?  ?  ?  ?  ?  ?  ?  ?  ?  ?   ? ? ? ?Pertinent Vitals/Pain Pain Assessment ?Pain Assessment: 0-10 ?Pain Score: 10-Worst pain ever ?Pain Location: Reports L scapula/shoulder pain 10/10.  States he's not doing anything unless someone comes to massage it ?Pain Descriptors / Indicators: Discomfort ?Pain Intervention(s): Repositioned, Limited activity within  patient's tolerance  ? ? ?Home Living Family/patient expects to be discharged to:: Shelter/Homeless (States he used to lives with granddad but he moved and he doesn't know where he moved to) ?Living Arrangements: Alone ?  ?  ?  ?  ?  ?  ?  ?Home Equipment: Wheelchair - manual ?Additional Comments: States he was just given w/c but hasn't really used it  ?  ?Prior Function Prior Level of Function : Needs assist ?  ?  ?  ?Physical Assist : Mobility (physical) ?  ?  ?Mobility Comments: Frequent readmissions where pt has left AMA and then fallen and returned to hospital ?  ?  ? ? ?Hand Dominance  ?   ? ?  ?Extremity/Trunk Assessment  ? Upper Extremity Assessment ?Upper  Extremity Assessment: Defer to OT evaluation ?  ? ?Lower Extremity Assessment ?Lower Extremity Assessment: Generalized weakness ?  ? ?   ?Communication  ? Communication: No difficulties  ?Cognition Arousal/Alertness: Awake/alert ?Behavior During Therapy: Agitated ?Overall Cognitive Status: Difficult to assess (Poor historian) ?  ?  ?  ?  ?  ?  ?  ?  ?  ?  ?  ?  ?  ?  ?  ?  ?General Comments: Pt is half OOB when PT arrives.  States he needs to get back to bed and needs help lifting his LE.  Refuses to amb with PT stating he "did that earlier" and now is in pain.  Of note, amb 300 ft with MS team this AM. ?  ?  ? ?  ?General Comments   ? ?  ?Exercises    ? ?Assessment/Plan  ?  ?PT Assessment Patient needs continued PT services  ?PT Problem List Decreased strength;Decreased mobility;Decreased range of motion;Decreased activity tolerance;Decreased balance;Decreased knowledge of use of DME;Pain ? ?   ?  ?PT Treatment Interventions DME instruction;Therapeutic exercise;Wheelchair mobility training;Gait training;Balance training;Stair training;Functional mobility training;Therapeutic activities;Patient/family education   ? ?PT Goals (Current goals can be found in the Care Plan section)  ?Acute Rehab PT Goals ?Patient Stated Goal: Pt's goal is to decrease pain ?PT Goal Formulation: With patient ?Time For Goal Achievement: 07/03/21 ?Potential to Achieve Goals: Good ? ?  ?Frequency Min 2X/week ?  ? ? ?Co-evaluation   ?  ?  ?  ?  ? ? ?  ?AM-PAC PT "6 Clicks" Mobility  ?Outcome Measure Help needed turning from your back to your side while in a flat bed without using bedrails?: A Lot ?Help needed moving from lying on your back to sitting on the side of a flat bed without using bedrails?: A Lot ?Help needed moving to and from a bed to a chair (including a wheelchair)?: A Lot ?Help needed standing up from a chair using your arms (e.g., wheelchair or bedside chair)?: A Lot ?Help needed to walk in hospital room?: A Lot ?Help needed  climbing 3-5 steps with a railing? : A Lot ?6 Click Score: 12 ? ?  ?End of Session   ?Activity Tolerance: Patient limited by pain;Treatment limited secondary to agitation ?Patient left: in bed;with call bell/phone within reach ?Nurse Communication: Mobility status ?PT Visit Diagnosis: Other abnormalities of gait and mobility (R26.89);History of falling (Z91.81) ?Pain - Right/Left: Left ?Pain - part of body: Shoulder ?  ? ?Time: [id number] ?PT Time Calculation (min) (ACUTE ONLY): 9 min ? ? ?Charges:   PT Evaluation ?$PT Eval Low Complexity: 1 Low ?  ?  ?   ? ? ?Mayumi Summerson A. Trae Bovenzi, PT, DPT ?  Acute Rehabilitation Services ?Office: [phone]  ? ?[place] ?06/19/2021, 3:34 PM ? ?

## 2021-06-19 NOTE — Evaluation (Signed)
Occupational Therapy Evaluation ?Patient Details ?Name: Logan Nelson ?MRN: [id number] ?DOB: [birth year] ?Today's Date: 06/19/2021 ? ? ?History of Present Illness Pt is 56 yr old M admitted on 06/16/21 with c/o weakness and back pain.  Recent admissions for hernia repair and B LE weakness/falls where pt has left AMA both times.  Imaging (+) for C4-5 abcess and OM of coccyx.  No surgical intervention planned at this time. PMH: anemia, arthritis, bradycardia, falls, HTN  ? ?Clinical Impression ?  ?56 yo male reporting indep with mobility and ADLs prior to admission with baseline RUE weakness that he attributes to a fall in recent past. He is significantly limited with mobility this date due to pain in upper back and shoulders. He demonstrates significantly impaired cervical spine rotation and note guarding and muscle tension over shoulders. He is able to transition to EOB and standing at bedside with mod A. He is unable to tolerate additional activity following sitting for 2-3 minutes due to his pain. Gentle stretching demonstrated to pt with goal of improving pain. Pt will benefit from continue acute OT services.   ?   ? ?Recommendations for follow up therapy are one component of a multi-disciplinary discharge planning process, led by the attending physician.  Recommendations may be updated based on patient status, additional functional criteria and insurance authorization.  ? ?Follow Up Recommendations ? Skilled nursing-short term rehab (<3 hours/day)  ?  ?Assistance Recommended at Discharge Frequent or constant Supervision/Assistance  ?Patient can return home with the following A lot of help with walking and/or transfers;A lot of help with bathing/dressing/bathroom;Assist for transportation;Assistance with cooking/housework ? ?  ?Functional Status Assessment ? Patient has had a recent decline in their functional status and demonstrates the ability to make significant improvements in function in a reasonable and  predictable amount of time.  ?Equipment Recommendations ? BSC/3in1;Tub/shower bench;Wheelchair (measurements OT);Wheelchair cushion (measurements OT)  ?  ?   ?Precautions / Restrictions Precautions ?Precautions: Fall ?Restrictions ?Weight Bearing Restrictions: No  ? ?  ? ?Mobility Bed Mobility ?Overal bed mobility: Needs Assistance ?Bed Mobility: Supine to Sit, Sit to Supine ?  ?  ?Supine to sit: Mod assist ?Sit to supine: Mod assist ?  ?  ?  ? ?Transfers ?Overall transfer level: Needs assistance ?Equipment used: 1 person hand held assist ?Transfers: Sit to/from Stand ?Sit to Stand: Mod assist, Min assist ?  ?  ?  ?  ?  ?  ?  ? ?  ?Balance Overall balance assessment: Needs assistance ?  ?  ?  ?  ?  ?  ?   ? ?ADL either performed or assessed with clinical judgement  ? ?ADL   ?Eating/Feeding: Set up;Bed level ?  ?Grooming: Bed level;Set up ?  ?Upper Body Bathing: Minimal assistance;Bed level ?  ?Lower Body Bathing: Maximal assistance;Bed level ?  ?Upper Body Dressing : Bed level;Minimal assistance ?  ?Lower Body Dressing: Maximal assistance;Bed level ?  ?Toilet Transfer: Minimal assistance;BSC/3in1 ?  ?  ?  ?  ?  ?  ?General ADL Comments: pt limited with mobility due to shoulder and neck pain  ? ? ? ?Vision Baseline Vision/History: 0 No visual deficits ?Vision Assessment?: No apparent visual deficits  ?   ?   ?   ? ?Pertinent Vitals/Pain Pain Assessment ?Pain Assessment: Faces ?Faces Pain Scale: Hurts whole lot ?Pain Location: pain in B shoulders, limited cervical ROM ?Pain Descriptors / Indicators: Guarding, Cramping ?Pain Intervention(s): Limited activity within patient's tolerance, Premedicated before session, Utilized relaxation  techniques, Repositioned  ? ? ? ?Hand Dominance Right ?  ?Extremity/Trunk Assessment Upper Extremity Assessment ?Upper Extremity Assessment: RUE deficits/detail ?RUE Deficits / Details: edema impacting hand ROM, decreased overhead reaching with shoulder flexion limited to approx 15 degrees  active, tolerating PROM shoulder flexion to approx 70 degrees ?LUE Coordination: decreased fine motor ?  ?Lower Extremity Assessment ?Lower Extremity Assessment: Generalized weakness ?  ?Cervical / Trunk Assessment ?Cervical / Trunk Assessment: Kyphotic;Other exceptions (C4-C5 epidural abscess, pt reports severe pain and note decreased cervical rotation R>L) ?  ?Communication Communication ?Communication: No difficulties ?  ?Cognition Arousal/Alertness: Awake/alert ?  ?Overall Cognitive Status: Difficult to assess ?Area of Impairment: Safety/judgement, Awareness ?  ?  ?  ?  ?  ?  ?  ?Memory: Decreased short-term memory ?Following Commands: Follows one step commands with increased time ?Safety/Judgement: Decreased awareness of safety ?Awareness: Intellectual ?  ?  ?  ?  ?   ?   ?   ? ? ?Home Living Family/patient expects to be discharged to:: Shelter/Homeless ?Living Arrangements: Alone ?  ?  ?  ?  ?  ?  ?  ?Home Equipment: Wheelchair - manual ?  ?Additional Comments: States he was just given w/c but hasn't really used it ?  ? ?  ?Prior Functioning/Environment Prior Level of Function : Needs assist ?  ?  ?  ?Physical Assist : Mobility (physical) ?  ?  ?Mobility Comments: frequent recent admissions, pt leaving AMA ?  ?  ? ?  ?  ?OT Problem List: Decreased strength;Decreased range of motion;Impaired balance (sitting and/or standing);Decreased activity tolerance;Decreased knowledge of use of DME or AE;Decreased safety awareness;Decreased knowledge of precautions;Pain ?  ?   ?OT Treatment/Interventions: Self-care/ADL training;Therapeutic exercise;Balance training;Therapeutic activities;DME and/or AE instruction  ?  ?OT Goals(Current goals can be found in the care plan section) Acute Rehab OT Goals ?Patient Stated Goal: Decrease pain ?OT Goal Formulation: With patient ?Time For Goal Achievement: 07/03/21 ?ADL Goals ?Pt Will Perform Grooming: with supervision;standing ?Pt Will Perform Upper Body Bathing: with  set-up;sitting ?Pt Will Perform Lower Body Bathing: with set-up;sitting/lateral leans ?Pt Will Perform Upper Body Dressing: with set-up;sitting ?Pt Will Perform Lower Body Dressing: with set-up;sitting/lateral leans ?Pt Will Transfer to Toilet: with supervision;bedside commode  ?OT Frequency: Min 2X/week ?  ? ?   ?AM-PAC OT "6 Clicks" Daily Activity     ?Outcome Measure Help from another person eating meals?: None ?Help from another person taking care of personal grooming?: A Little ?Help from another person toileting, which includes using toliet, bedpan, or urinal?: A Little ?Help from another person bathing (including washing, rinsing, drying)?: A Lot ?Help from another person to put on and taking off regular upper body clothing?: A Lot ?Help from another person to put on and taking off regular lower body clothing?: A Lot ?6 Click Score: 16 ?  ?End of Session   ? ?Activity Tolerance:   limited by pain  ?Patient left:   in bed  ? ?OT Visit Diagnosis: Unsteadiness on feet (R26.81);Other abnormalities of gait and mobility (R26.89);Muscle weakness (generalized) (M62.81);History of falling (Z91.81) ?Hemiplegia - Right/Left: Right ?Hemiplegia - caused by: Unspecified  ?              ?Time: 1650-1706 ?OT Time Calculation (min): 16 min ?Charges:  OT General Charges ?$OT Visit: 1 Visit ?OT Evaluation ?$OT Eval Low Complexity: 1 Low ? ? ?Kasandra Knudsen, OTR/L ?06/19/2021, 5:19 PM ?

## 2021-06-19 NOTE — Progress Notes (Signed)
Brief ID note: ? ?Patient underwent imaging as ordered by Dr. West Bali overnight.   ? ?This was notable for a new ventral epidural abscess behind C4-C5 causing moderate stenosis but no flattening of the cord and no signal changes of the cord.  He has no neurologic deficits as a result.  He was evaluated by neurosurgery overnight who obtained a CT as well.  This showed no significant bony destruction.  At this time, they recommend holding off on urgent surgical intervention as his exam is stable and his weakness predates the development of the epidural fluid collection.  He remains on antibiotics to hopefully control his infection.  Neurosurgery is also recommending ENT evaluation for retropharyngeal abscess that they have noted as this may or may not need to be addressed separately from his paraspinous infection. ? ?Additionally, MR sacrum noted marrow edema throughout the entire first through fourth coccygeal segments with cortical erosion indicating acute osteomyelitis as well as surrounding edema throughout the gluteus musculature and posterior subcutaneous fat with no walled off abscess visualized.   ? ? ?Plan: ?Will continue Unasyn at this time to treat his disseminated MSSA infection.  Appreciate neurosurgery evaluation who is also recommending ENT evaluation.  His repeat blood cultures at this time are no growth to date.  Dr Jerilynn Mages is back Monday. ? ? ?Mignon Pine ?[facility] ?[facility] ?06/19/2021, 3:08 PM ? ?

## 2021-06-19 NOTE — Progress Notes (Signed)
Spoke with Dr Franky Macho who originally consulted on Logan Nelson, and he reviewed the imaging and agrees no surgery is indicated at this time ?

## 2021-06-20 ENCOUNTER — Inpatient Hospital Stay (HOSPITAL_COMMUNITY): Payer: Medicaid Other

## 2021-06-20 ENCOUNTER — Inpatient Hospital Stay (HOSPITAL_COMMUNITY): Payer: Medicaid Other | Admitting: Certified Registered Nurse Anesthetist

## 2021-06-20 ENCOUNTER — Encounter (HOSPITAL_COMMUNITY): Payer: Self-pay | Admitting: Internal Medicine

## 2021-06-20 ENCOUNTER — Encounter (HOSPITAL_COMMUNITY)
Admission: EM | Disposition: A | Discharge status: Skilled Nursing Facility | Payer: Self-pay | Source: Home / Self Care | Attending: Internal Medicine

## 2021-06-20 DIAGNOSIS — G061 Intraspinal abscess and granuloma: Secondary | ICD-10-CM | POA: Diagnosis not present

## 2021-06-20 DIAGNOSIS — I6381 Other cerebral infarction due to occlusion or stenosis of small artery: Secondary | ICD-10-CM | POA: Diagnosis not present

## 2021-06-20 DIAGNOSIS — B9561 Methicillin susceptible Staphylococcus aureus infection as the cause of diseases classified elsewhere: Secondary | ICD-10-CM | POA: Diagnosis not present

## 2021-06-20 DIAGNOSIS — M47812 Spondylosis without myelopathy or radiculopathy, cervical region: Secondary | ICD-10-CM | POA: Diagnosis not present

## 2021-06-20 DIAGNOSIS — Z8673 Personal history of transient ischemic attack (TIA), and cerebral infarction without residual deficits: Secondary | ICD-10-CM

## 2021-06-20 DIAGNOSIS — R531 Weakness: Secondary | ICD-10-CM | POA: Diagnosis not present

## 2021-06-20 DIAGNOSIS — I10 Essential (primary) hypertension: Secondary | ICD-10-CM

## 2021-06-20 DIAGNOSIS — M4802 Spinal stenosis, cervical region: Secondary | ICD-10-CM | POA: Diagnosis not present

## 2021-06-20 DIAGNOSIS — D649 Anemia, unspecified: Secondary | ICD-10-CM

## 2021-06-20 DIAGNOSIS — J90 Pleural effusion, not elsewhere classified: Secondary | ICD-10-CM | POA: Diagnosis not present

## 2021-06-20 DIAGNOSIS — M1990 Unspecified osteoarthritis, unspecified site: Secondary | ICD-10-CM | POA: Diagnosis not present

## 2021-06-20 DIAGNOSIS — M4642 Discitis, unspecified, cervical region: Secondary | ICD-10-CM | POA: Diagnosis not present

## 2021-06-20 DIAGNOSIS — R7881 Bacteremia: Secondary | ICD-10-CM | POA: Diagnosis not present

## 2021-06-20 HISTORY — PX: RADIOLOGY WITH ANESTHESIA: SHX6223

## 2021-06-20 LAB — MAGNESIUM: Magnesium: 1.5 mg/dL — ABNORMAL LOW (ref 1.7–2.4)

## 2021-06-20 SURGERY — MRI WITH ANESTHESIA
Anesthesia: General

## 2021-06-20 MED ORDER — FUROSEMIDE 20 MG PO TABS
20.0000 mg | ORAL_TABLET | Freq: Every day | ORAL | Status: DC
  Administered 2021-06-20 – 2021-06-27 (×7): 20 mg via ORAL
  Filled 2021-06-20 (×7): qty 1

## 2021-06-20 MED ORDER — MIDAZOLAM HCL 2 MG/2ML IJ SOLN
INTRAMUSCULAR | Status: DC | PRN
  Administered 2021-06-20: 2 mg via INTRAVENOUS

## 2021-06-20 MED ORDER — CHLORHEXIDINE GLUCONATE 0.12 % MT SOLN: 15.0000 mL | Freq: Once | OROMUCOSAL | Status: AC

## 2021-06-20 MED ORDER — SUGAMMADEX SODIUM 200 MG/2ML IV SOLN
INTRAVENOUS | Status: DC | PRN
  Administered 2021-06-20: 200 mg via INTRAVENOUS

## 2021-06-20 MED ORDER — ONDANSETRON HCL 4 MG/2ML IJ SOLN: 4.0000 mg | Freq: Once | INTRAMUSCULAR | Status: DC | PRN

## 2021-06-20 MED ORDER — PROPOFOL 10 MG/ML IV BOLUS
INTRAVENOUS | Status: DC | PRN
  Administered 2021-06-20: 130 mg via INTRAVENOUS

## 2021-06-20 MED ORDER — FENTANYL CITRATE (PF) 100 MCG/2ML IJ SOLN: 25.0000 ug | INTRAMUSCULAR | Status: DC | PRN

## 2021-06-20 MED ORDER — ONDANSETRON HCL 4 MG/2ML IJ SOLN
INTRAMUSCULAR | Status: DC | PRN
  Administered 2021-06-20: 4 mg via INTRAVENOUS

## 2021-06-20 MED ORDER — LIDOCAINE 2% (20 MG/ML) 5 ML SYRINGE
INTRAMUSCULAR | Status: DC | PRN
  Administered 2021-06-20: 60 mg via INTRAVENOUS

## 2021-06-20 MED ORDER — PHENYLEPHRINE HCL-NACL 20-0.9 MG/250ML-% IV SOLN
INTRAVENOUS | Status: DC | PRN
  Administered 2021-06-20: 40 ug/min via INTRAVENOUS

## 2021-06-20 MED ORDER — GADOBUTROL 1 MMOL/ML IV SOLN
6.7000 mL | Freq: Once | INTRAVENOUS | Status: AC | PRN
  Administered 2021-06-20: 6.7 mL via INTRAVENOUS

## 2021-06-20 MED ORDER — PHENYLEPHRINE 80 MCG/ML (10ML) SYRINGE FOR IV PUSH (FOR BLOOD PRESSURE SUPPORT)
PREFILLED_SYRINGE | INTRAVENOUS | Status: DC | PRN
Start: 2021-06-20 — End: 2021-06-20
  Administered 2021-06-20: 240 ug via INTRAVENOUS
  Administered 2021-06-20: 160 ug via INTRAVENOUS

## 2021-06-20 MED ORDER — FENTANYL CITRATE (PF) 100 MCG/2ML IJ SOLN
INTRAMUSCULAR | Status: AC
  Filled 2021-06-20: qty 2

## 2021-06-20 MED ORDER — CHLORHEXIDINE GLUCONATE 0.12 % MT SOLN
OROMUCOSAL | Status: AC
  Administered 2021-06-20: 15 mL via OROMUCOSAL
  Filled 2021-06-20: qty 15

## 2021-06-20 MED ORDER — DEXAMETHASONE SODIUM PHOSPHATE 10 MG/ML IJ SOLN
INTRAMUSCULAR | Status: DC | PRN
  Administered 2021-06-20: 10 mg via INTRAVENOUS

## 2021-06-20 MED ORDER — OXYCODONE HCL 5 MG PO TABS
10.0000 mg | ORAL_TABLET | ORAL | Status: DC | PRN
  Administered 2021-06-20 – 2021-07-04 (×31): 10 mg via ORAL
  Filled 2021-06-20 (×34): qty 2

## 2021-06-20 MED ORDER — FENTANYL CITRATE (PF) 100 MCG/2ML IJ SOLN
INTRAMUSCULAR | Status: DC | PRN
  Administered 2021-06-20: 100 ug via INTRAVENOUS

## 2021-06-20 MED ORDER — MIDAZOLAM HCL 2 MG/2ML IJ SOLN
INTRAMUSCULAR | Status: AC
  Filled 2021-06-20: qty 2

## 2021-06-20 MED ORDER — FENTANYL CITRATE (PF) 250 MCG/5ML IJ SOLN: INTRAMUSCULAR | Status: DC | PRN

## 2021-06-20 MED ORDER — ROCURONIUM BROMIDE 10 MG/ML (PF) SYRINGE
PREFILLED_SYRINGE | INTRAVENOUS | Status: DC | PRN
Start: 2021-06-20 — End: 2021-06-20
  Administered 2021-06-20: 100 mg via INTRAVENOUS

## 2021-06-20 MED ORDER — LACTATED RINGERS IV SOLN: INTRAVENOUS | Status: DC

## 2021-06-20 MED ORDER — MAGNESIUM SULFATE 2 GM/50ML IV SOLN
2.0000 g | Freq: Once | INTRAVENOUS | Status: AC
  Administered 2021-06-20: 2 g via INTRAVENOUS
  Filled 2021-06-20: qty 50

## 2021-06-20 MED ORDER — ORAL CARE MOUTH RINSE: 15.0000 mL | Freq: Once | OROMUCOSAL | Status: AC

## 2021-06-20 NOTE — Plan of Care (Signed)

## 2021-06-20 NOTE — Progress Notes (Signed)
? ? ?  I spoke with Dr. Anne Fu about this patient since the TEE was initially scheduled with him for tomorrow. He said a retropharyngeal abscess is a contraindication for the TEE and would also be challenging due to the limited mobility in his neck. Would continue with IV antibiotics for now and can consider a TEE in the future once his abscess has resolved. Hospitalist team made aware and will also send a message to the Card Master. Endo updated. ? ?Signed, ?Ellsworth Lennox, PA-C ?06/20/2021, 1:47 PM ? ?

## 2021-06-20 NOTE — Transfer of Care (Signed)
Immediate Anesthesia Transfer of Care Note ? ?Patient: Logan Nelson ? ?Procedure(s) Performed: MRI WITH ANESTHESIA ? ?Patient Location: PACU ? ?Anesthesia Type:General ? ?Level of Consciousness: awake and alert  ? ?Airway & Oxygen Therapy: Patient Spontanous Breathing and Patient connected to face mask oxygen ? ?Post-op Assessment: Report given to RN and Post -op Vital signs reviewed and stable ? ?Post vital signs: Reviewed and stable ? ?Last Vitals:  ?Vitals Value Taken Time  ?BP 123/92 06/20/21 1733  ?Temp 36.3 06/20/21 1733  ?Pulse 67 06/20/21 1732  ?Resp 18 06/20/21 1734  ?SpO2 95 % 06/20/21 1732  ?Vitals shown include unvalidated device data. ? ?Last Pain:  ?Vitals:  ? 06/20/21 1500  ?TempSrc: Axillary  ?PainSc:   ?   ? ?Patients Stated Pain Goal: 0 (06/19/21 2014) ? ?Complications: No notable events documented. ?

## 2021-06-20 NOTE — Progress Notes (Addendum)
Patient seen and examined this morning.  He is complaining of neck pain today and states "I cannot move."  He states he has been this way for "2 days."  And that he is holding his cup of water in his left hand and drinking.  His exam appears to be unchanged where he is 3 out of 5 in the right upper extremity and 4-5 out of 5 in the left upper extremity.  He states he cannot lift his left arm but I can lift it for him and he can hold it straight up in the air without difficulty.  He wiggles his toes. ? ?This is so difficult because he is very unreliable and a poor historian.  As best I can tell his physical exam is unchanged. ? ?I first ordered a CT scan of the cervical spine with and without because an MRI for him requires general anesthesia, but as noted on the CT scan a couple of days ago this under calls of the size of the epidural abscess which we would like to reevaluate given his new complaints.  CT called me and therefore I have canceled the CT scan and I have ordered an MRI since this is the necessary study.  I have ordered an MRI of the cervical spine without and with contrast to be done under anesthesia.  I have called the MRI tech and they state they will coordinate with anesthesia.  CT scan of soft tissues ordered for tomorrow.  I do not see an ENT consult on the chart at this time. ?

## 2021-06-20 NOTE — Progress Notes (Signed)
Pharmacy Antibiotic Note ? ?Logan Nelson is a 56 y.o. 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  Pharmacy has been consulted for Unasyn dosing. ? ?WBC elevated at 12.1 (5/12), afebrile.  Scr<1. TEE originally scheduled for 5/15, however this was cancelled pending ENT evaluation. Unasyn selected over other agents to cover for possible infected sacral decubitus ulcer. Also a good choice considering possible retropharyngeal abscess.  ? ?Plan: ?Continue Unasyn 3g IV q6h ?Trend WBC, temp, renal function  ?F/U ID recs ? ?Height: 5' 5.98" (167.6 cm) ?Weight: 67.3 kg (148 lb 5.9 oz) ?IBW/kg (Calculated) : 63.76 ? ?Temp (24hrs), Avg:98 ?F (36.7 ?C), Min:97.8 ?F (36.6 ?C), Max:98.2 ?F (36.8 ?C) ? ?Recent Labs  ?Lab 06/13/21 ?1414 06/14/21 ?1127 06/14/21 ?1655 06/16/21 ?0500 06/16/21 ?2159 06/18/21 ?0246  ?WBC 15.2*  --  13.1* 17.6* 14.5* 12.1*  ?CREATININE 0.57* 0.60*  --  0.64 0.78 0.74  ? ?  ?Estimated Creatinine Clearance: 90.8 mL/min (by C-G formula based on SCr of 0.74 mg/dL).   ? ?No Known Allergies ? ?Antimicrobials this admission: ?Unasyn 5/11 ? ?Microbiology results: ?Unasyn 5/10>> ?Cefepime 5/8>>5/9 ?Cefazolin 5/9>>5/10  ?Flagyl 5/9, 5/11  ? ?Jani Gravel, PharmD ?PGY-1 Acute Care Resident  ?06/20/2021 9:39 AM  ? ? ?

## 2021-06-20 NOTE — Anesthesia Procedure Notes (Signed)
Procedure Name: Intubation ?Date/Time: 06/20/2021 4:13 PM ?Performed by: Tressia Miners, CRNA ?Pre-anesthesia Checklist: Patient identified, Emergency Drugs available, Suction available and Patient being monitored ?Patient Re-evaluated:Patient Re-evaluated prior to induction ?Oxygen Delivery Method: Circle System Utilized ?Preoxygenation: Pre-oxygenation with 100% oxygen ?Induction Type: IV induction ?Ventilation: Mask ventilation without difficulty ?Laryngoscope Size: Glidescope and 4 ?Tube type: Oral ?Tube size: 7.5 mm ?Number of attempts: 1 ?Airway Equipment and Method: Stylet ?Placement Confirmation: ETT inserted through vocal cords under direct vision, positive ETCO2 and breath sounds checked- equal and bilateral ?Secured at: 22 cm ?Tube secured with: Tape ?Dental Injury: Teeth and Oropharynx as per pre-operative assessment  ?Comments: Elective glidescope intubation d/t cervical 4-5 abscess with associated numbness and weakness and several very loose teeth pre-operatively. Midline neck stabilization maintained throughout induction/intubation ? ? ? ? ?

## 2021-06-20 NOTE — Progress Notes (Signed)
?PROGRESS NOTE ? ? ? ?Logan Nelson  [id number] DOB: 1965/06/05 DOA: 06/16/2021 ?PCP: [name], MD  ? ? ?Brief Narrative:  ?56 year old gentleman with history of hypertension, anasarca, recently admitted for incarcerated abdominal hernia with primary repair  Who left AGAINST MEDICAL ADVICE before he was placed to a rehab.  He subsequently again admitted from 4/4-5/11 with severe debility and malnutrition and failure to thrive.  While he was in the hospital, developed MSSA bacteremia and sepsis, back pain. Was under treatment but left AMA 5/10 just to come back due to profound weakness and now decided he will complete treatment. ? ? ?Assessment & Plan: ?  ?MSSA bacteremia developed while in the hospital, primary source unknown.  Likely sacral decubitus ulcer not present on admission. ?Blood cultures 5/8, MSSA ?Blood cultures 5/10, no growth so far ?Blood cultures 5/11, no growth so far. ?TTE negative for vegetation ?TEE scheduled for 5/15. ?Currently remains on Unasyn ?MRI T-spine, L-spine, sacral spine with multilevel osteomyelitis, cervical abscess but no central cord compression.  Seen by neurosurgery and recommended no surgical management. ?With worsening pain 5/14, neurosurgery ordered for MRI C-spine today. ?Adequate pain medications.  Followed by ID. ?Anticipate prolonged IV antibiotics 6 to 8 weeks. ?With anterior cervical abscess, case discussed with ENT, Dr. Redmond Baseman 5/13.  He recommended repeat CT soft tissues 5/15, if persistent or increasing in size then to call back for ENT consultation. ?No airway compromise, they think there should not be any issues with TEE. ? ?Acute systolic congestive heart failure: ?Echocardiogram 3/23 with ejection fraction 60 to 65% ?Repeat echocardiogram with 30 to 35%.  Currently on IV Lasix.  Will change to oral Lasix.  Euvolemic. ?Once infection work-up is completed, will need ischemia evaluation.  Given acute drop in ejection fraction this is likely due to stress  cardiomyopathy. ?We will see ejection fraction with echocardiogram that is scheduled for 5/15. ? ?Severe protein calorie malnutrition: Augment nutrition. ? ?Severe debility, frailty: Work with PT OT .Refer to SNF. ? ? ?Pressure Injury 06/17/21 Sacrum Stage 3 -  Full thickness tissue loss. Subcutaneous fat may be visible but bone, tendon or muscle are NOT exposed. 2 in vertical by .5 in horizontal--approximately--wound tunneling and pink (Active)  ?06/17/21 1159  ?Location: Sacrum  ?Location Orientation:   ?Staging: Stage 3 -  Full thickness tissue loss. Subcutaneous fat may be visible but bone, tendon or muscle are NOT exposed.  ?Wound Description (Comments): 2 in vertical by .5 in horizontal--approximately--wound tunneling and pink  ?Present on Admission:   ? ?Essential hypertension: Blood pressure are stable. ? ?Anemia of chronic disease: 3 PRBC transfusion during previous hospitalization.  No active bleeding.  Continue to monitor. ? ?Hypomagnesemia: Replace and monitor electrolytes. ? ? ?DVT prophylaxis: SCDs Start: 06/17/21 1144 ? ? ?Code Status: Full code ?Family Communication: None. ?Disposition Plan: Status is: Inpatient ?Remains inpatient appropriate because: Treatment for bacteremia.  Unsafe discharge. ?  ? ? ?Consultants:  ?Infectious disease ?Neurosurgery ?ENT, ? ?Procedures:  ?None ? ?Antimicrobials:  ?Unasyn 5/11--- ? ? ?Subjective: ? ?Patient seen and examined.  He tells me he is having more trouble with his neck pain and pain going to the both scapular region.  Difficult to lay down in the bed.  We discussed about going up on the pain medications.  Seen by surgery.  They are rescheduling for repeat MRI. ? ?Objective: ?Vitals:  ? 06/19/21 1956 06/19/21 2326 06/20/21 0458 06/20/21 [id number]  ?BP: 120/85 125/88 117/90 (!) 125/95  ?Pulse: 78 85 66 69  ?  Resp: 19 19 18 18   ?Temp: 98.1 ?F (36.7 ?C) 98 ?F (36.7 ?C) 97.9 ?F (36.6 ?C) 97.9 ?F (36.6 ?C)  ?TempSrc: Oral Oral Oral Oral  ?SpO2: 100% 100% 99% 100%   ?Weight:      ?Height:      ? ? ?Intake/Output Summary (Last 24 hours) at 06/20/2021 1149 ?Last data filed at 06/20/2021 0730 ?Gross per 24 hour  ?Intake 460 ml  ?Output 850 ml  ?Net -390 ml  ? ?Filed Weights  ? 06/18/21 0600  ?Weight: 67.3 kg  ? ? ?Examination: ? ?General exam: Appears anxious and in mild distress today. ?Alert oriented x4.  Blunted mood and affect. ?Respiratory system: Clear to auscultation. Respiratory effort normal. ?Cardiovascular system: S1 & S2 heard, RRR.  ?Gastrointestinal system: Soft.  Nontender.  Midline surgical scars clean and dry. ?Central nervous system: Alert and oriented.  Global weakness. ?Extremities: Difficulties moving both hands, left weaker than right.  Neck is stiffness present. ?Skin:  ?Stage II left buttock ?Stage III sacral pressure wound.  Dressing on.  Clean and dry. ? ? ? ?Data Reviewed: I have personally reviewed following labs and imaging studies ? ?CBC: ?Recent Labs  ?Lab 06/13/21 ?1414 06/14/21 ?1655 06/16/21 ?0500 06/16/21 ?2159 06/18/21 ?0246  ?WBC 15.2* 13.1* 17.6* 14.5* 12.1*  ?NEUTROABS 12.8* 10.5*  --  10.8*  --   ?HGB 10.2* 9.2* 8.8* 9.2* 10.4*  ?HCT 32.0* 29.0* 27.4* 29.0* 32.6*  ?MCV 92.8 91.8 91.3 91.8 91.1  ?PLT 413* 351 338 346 445*  ? ?Basic Metabolic Panel: ?Recent Labs  ?Lab 06/13/21 ?1414 06/14/21 ?1127 06/16/21 ?0500 06/16/21 ?2159 06/18/21 ?0246 06/20/21 ?1046  ?NA 138 140 138 141 138  --   ?K 4.0 4.0 4.0 4.2 3.0*  --   ?CL 102 103 107 108 106  --   ?CO2 29 29 26 24 24   --   ?GLUCOSE 116* 125* 124* 92 159*  --   ?BUN 23* 29* 33* 40* 31*  --   ?CREATININE 0.57* 0.60* 0.64 0.78 0.74  --   ?CALCIUM 8.8* 8.9 8.7* 9.0 8.2*  --   ?MG 1.8 1.9 1.9  --   --  1.5*  ? ?GFR: ?Estimated Creatinine Clearance: 90.8 mL/min (by C-G formula based on SCr of 0.74 mg/dL). ?Liver Function Tests: ?Recent Labs  ?Lab 06/13/21 ?1414 06/17/21 ?0421  ?AST 21 24  ?ALT 22 17  ?ALKPHOS 94 89  ?BILITOT 0.6 1.0  ?PROT 7.1 6.8  ?ALBUMIN 3.0* 2.8*  ? ?Recent Labs  ?Lab  06/17/21 ?0421  ?LIPASE 19  ? ?No results for input(s): AMMONIA in the last 168 hours. ?Coagulation Profile: ?No results for input(s): INR, PROTIME in the last 168 hours. ?Cardiac Enzymes: ?No results for input(s): CKTOTAL, CKMB, CKMBINDEX, TROPONINI in the last 168 hours. ?BNP (last 3 results) ?No results for input(s): PROBNP in the last 8760 hours. ?HbA1C: ?No results for input(s): HGBA1C in the last 72 hours. ?CBG: ?Recent Labs  ?Lab 06/18/21 ?2014  ?GLUCAP 120*  ? ?Lipid Profile: ?No results for input(s): CHOL, HDL, LDLCALC, TRIG, CHOLHDL, LDLDIRECT in the last 72 hours. ?Thyroid Function Tests: ?No results for input(s): TSH, T4TOTAL, FREET4, T3FREE, THYROIDAB in the last 72 hours. ?Anemia Panel: ?No results for input(s): VITAMINB12, FOLATE, FERRITIN, TIBC, IRON, RETICCTPCT in the last 72 hours. ?Sepsis Labs: ?Recent Labs  ?Lab 06/14/21 ?1655 06/15/21 ?0053 06/16/21 ?0500  ?PROCALCITON 1.03 1.34 5.33  ? ? ?Recent Results (from the past 240 hour(s))  ?Culture, blood (Routine X 2) w Reflex  to ID Panel     Status: Abnormal  ? Collection Time: 06/14/21  4:45 PM  ? Specimen: BLOOD  ?Result Value Ref Range Status  ? Specimen Description BLOOD LEFT ANTECUBITAL  Final  ? Special Requests   Final  ?  BOTTLES DRAWN AEROBIC AND ANAEROBIC Blood Culture results may not be optimal due to an inadequate volume of blood received in culture bottles  ? Culture  Setup Time   Final  ?  GRAM POSITIVE COCCI IN CLUSTERS ?IN BOTH AEROBIC AND ANAEROBIC BOTTLES ?CRITICAL VALUE NOTED.  VALUE IS CONSISTENT WITH PREVIOUSLY REPORTED AND CALLED VALUE. ?  ? Culture (A)  Final  ?  STAPHYLOCOCCUS AUREUS ?SUSCEPTIBILITIES PERFORMED ON PREVIOUS CULTURE WITHIN THE LAST 5 DAYS. ?Performed at [facility], [place] [address], [place], [place] [address] ?  ? Report Status 06/17/2021 FINAL  Final  ?Culture, blood (Routine X 2) w Reflex to ID Panel     Status: Abnormal  ? Collection Time: 06/14/21  4:55 PM  ? Specimen: BLOOD  ?Result Value Ref Range  Status  ? Specimen Description BLOOD RIGHT ANTECUBITAL  Final  ? Special Requests   Final  ?  BOTTLES DRAWN AEROBIC AND ANAEROBIC Blood Culture adequate volume  ? Culture  Setup Time   Final  ?  GRAM POSITIVE COCCI IN CL

## 2021-06-20 NOTE — Anesthesia Preprocedure Evaluation (Addendum)
Anesthesia Evaluation  ?Patient identified by MRN, date of birth, ID band ?Patient awake ? ? ? ?Reviewed: ?Allergy & Precautions, NPO status , Patient's Chart, lab work & pertinent test results ? ?Airway ?Mallampati: I ? ?TM Distance: >3 FB ?Neck ROM: Full ? ? ? Dental ? ?(+) Loose, Dental Advisory Given, Poor Dentition,  ?  ?Pulmonary ?former smoker,  ?  ?Pulmonary exam normal ? ? ? ? ? ? ? Cardiovascular ?hypertension, Pt. on medications ?Normal cardiovascular exam ? ? ?  ?Neuro/Psych ?CVA negative psych ROS  ? GI/Hepatic ?Neg liver ROS, GERD  Controlled,  ?Endo/Other  ?negative endocrine ROS ? Renal/GU ?negative Renal ROSLab Results ?     Component                Value               Date                 ?     CREATININE               0.74                06/18/2021           ?       K                        3.0 (L)             06/18/2021           ?       ?negative genitourinary ?  ?Musculoskeletal ? ?(+) Arthritis ,  ? Abdominal ?  ?Peds ? Hematology ? ?(+) Blood dyscrasia, anemia , Lab Results ?     Component                Value               Date                 ?     WBC                      12.1 (H)            06/18/2021           ?     HGB                      10.4 (L)            06/18/2021           ?     HCT                      32.6 (L)            06/18/2021           ?     MCV                      91.1                06/18/2021           ?     PLT                      445 (H)  06/18/2021           ?   ?Anesthesia Other Findings ?Weakness ? Reproductive/Obstetrics ?negative OB ROS ? ?  ? ? ? ? ? ? ? ? ? ? ? ? ? ?  ?  ? ? ? ? ? ? ? ?Anesthesia Physical ? ?Anesthesia Plan ? ?ASA: 3 ? ?Anesthesia Plan: General  ? ?Post-op Pain Management:   ? ?Induction: Intravenous ? ?PONV Risk Score and Plan: 3 and Treatment may vary due to age or medical condition, Ondansetron, Midazolam and Dexamethasone ? ?Airway Management Planned: Oral ETT ? ?Additional Equipment:   ? ?Intra-op Plan:  ? ?Post-operative Plan: Extubation in OR ? ?Informed Consent: I have reviewed the patients History and Physical, chart, labs and discussed the procedure including the risks, benefits and alternatives for the proposed anesthesia with the patient or authorized representative who has indicated his/her understanding and acceptance.  ? ? ? ?Dental advisory given ? ?Plan Discussed with: CRNA ? ?Anesthesia Plan Comments:   ? ? ? ? ? ?Anesthesia Quick Evaluation ? ?

## 2021-06-20 NOTE — Anesthesia Postprocedure Evaluation (Signed)
Anesthesia Post Note ? ?Patient: Logan Nelson ? ?Procedure(s) Performed: MRI WITH ANESTHESIA ? ?  ? ?Patient location during evaluation: PACU ?Anesthesia Type: General ?Level of consciousness: awake ?Pain management: pain level controlled ?Vital Signs Assessment: post-procedure vital signs reviewed and stable ?Respiratory status: spontaneous breathing, nonlabored ventilation, respiratory function stable and patient connected to nasal cannula oxygen ?Cardiovascular status: blood pressure returned to baseline and stable ?Postop Assessment: no apparent nausea or vomiting ?Anesthetic complications: no ? ? ?No notable events documented. ? ?Last Vitals:  ?Vitals:  ? 06/20/21 1748 06/20/21 1811  ?BP: 129/87 (!) 118/95  ?Pulse: 86 95  ?Resp: 17 17  ?Temp: (!) 36.3 ?C 36.6 ?C  ?SpO2: 97% 98%  ?  ?Last Pain:  ?Vitals:  ? 06/20/21 1811  ?TempSrc: Oral  ?PainSc:   ? ? ?  ?  ?  ?  ?  ?  ? ?Logan Nelson P [name] ? ? ? ? ?

## 2021-06-20 NOTE — Progress Notes (Signed)
Patient arrived back to his room. Vital signs taken and stable. Patient resting comfortably and call bell within reach. Dinner ordered for him. ?

## 2021-06-21 ENCOUNTER — Encounter (HOSPITAL_COMMUNITY): Payer: Self-pay | Admitting: Radiology

## 2021-06-21 ENCOUNTER — Encounter (HOSPITAL_COMMUNITY)
Admission: EM | Disposition: A | Discharge status: Skilled Nursing Facility | Payer: Self-pay | Source: Home / Self Care | Attending: Internal Medicine

## 2021-06-21 DIAGNOSIS — G062 Extradural and subdural abscess, unspecified: Secondary | ICD-10-CM

## 2021-06-21 DIAGNOSIS — B9561 Methicillin susceptible Staphylococcus aureus infection as the cause of diseases classified elsewhere: Secondary | ICD-10-CM | POA: Diagnosis not present

## 2021-06-21 DIAGNOSIS — R7881 Bacteremia: Secondary | ICD-10-CM | POA: Diagnosis not present

## 2021-06-21 DIAGNOSIS — M4642 Discitis, unspecified, cervical region: Secondary | ICD-10-CM | POA: Diagnosis not present

## 2021-06-21 DIAGNOSIS — G061 Intraspinal abscess and granuloma: Secondary | ICD-10-CM | POA: Diagnosis not present

## 2021-06-21 DIAGNOSIS — M4802 Spinal stenosis, cervical region: Secondary | ICD-10-CM | POA: Diagnosis not present

## 2021-06-21 LAB — COMPREHENSIVE METABOLIC PANEL
ALT: 17 U/L (ref 0–44)
AST: 19 U/L (ref 15–41)
Albumin: 2.3 g/dL — ABNORMAL LOW (ref 3.5–5.0)
Alkaline Phosphatase: 75 U/L (ref 38–126)
Anion gap: 6 (ref 5–15)
BUN: 16 mg/dL (ref 6–20)
CO2: 23 mmol/L (ref 22–32)
Calcium: 8.2 mg/dL — ABNORMAL LOW (ref 8.9–10.3)
Chloride: 109 mmol/L (ref 98–111)
Creatinine, Ser: 0.56 mg/dL — ABNORMAL LOW (ref 0.61–1.24)
GFR, Estimated: >60 mL/min (ref >60)
Glucose, Bld: 162 mg/dL — ABNORMAL HIGH (ref 70–99)
Potassium: 5 mmol/L (ref 3.5–5.1)
Sodium: 138 mmol/L (ref 135–145)
Total Bilirubin: 0.4 mg/dL (ref 0.3–1.2)
Total Protein: 5.8 g/dL — ABNORMAL LOW (ref 6.5–8.1)

## 2021-06-21 LAB — CBC WITH DIFFERENTIAL/PLATELET
Abs Immature Granulocytes: 0.12 10*3/uL — ABNORMAL HIGH (ref 0.00–0.07)
Basophils Absolute: 0 10*3/uL (ref 0.0–0.1)
Basophils Relative: 0 %
Eosinophils Absolute: 0 10*3/uL (ref 0.0–0.5)
Eosinophils Relative: 0 %
HCT: 34.9 % — ABNORMAL LOW (ref 39.0–52.0)
Hemoglobin: 10.8 g/dL — ABNORMAL LOW (ref 13.0–17.0)
Immature Granulocytes: 1 %
Lymphocytes Relative: 8 %
Lymphs Abs: 1.2 10*3/uL (ref 0.7–4.0)
MCH: 29.3 pg (ref 26.0–34.0)
MCHC: 30.9 g/dL (ref 30.0–36.0)
MCV: 94.6 fL (ref 80.0–100.0)
Monocytes Absolute: 0.6 10*3/uL (ref 0.1–1.0)
Monocytes Relative: 4 %
Neutro Abs: 12.9 10*3/uL — ABNORMAL HIGH (ref 1.7–7.7)
Neutrophils Relative %: 87 %
Platelets: 487 10*3/uL — ABNORMAL HIGH (ref 150–400)
RBC: 3.69 MIL/uL — ABNORMAL LOW (ref 4.22–5.81)
RDW: 17.9 % — ABNORMAL HIGH (ref 11.5–15.5)
WBC: 14.9 10*3/uL — ABNORMAL HIGH (ref 4.0–10.5)
nRBC: 0 % (ref 0.0–0.2)

## 2021-06-21 LAB — CULTURE, BLOOD (ROUTINE X 2)
Culture: NO GROWTH
Culture: NO GROWTH

## 2021-06-21 LAB — PHOSPHORUS: Phosphorus: 2.3 mg/dL — ABNORMAL LOW (ref 2.5–4.6)

## 2021-06-21 SURGERY — ECHOCARDIOGRAM, TRANSESOPHAGEAL
Anesthesia: Monitor Anesthesia Care

## 2021-06-21 MED ORDER — ENOXAPARIN SODIUM 40 MG/0.4ML IJ SOSY
40.0000 mg | PREFILLED_SYRINGE | Freq: Every day | INTRAMUSCULAR | Status: DC
  Administered 2021-06-21 – 2021-06-22 (×2): 40 mg via SUBCUTANEOUS
  Filled 2021-06-21 (×2): qty 0.4

## 2021-06-21 MED ORDER — CEFAZOLIN SODIUM-DEXTROSE 2-4 GM/100ML-% IV SOLN
2.0000 g | Freq: Three times a day (TID) | INTRAVENOUS | Status: DC
  Administered 2021-06-24 – 2021-07-05 (×35): 2 g via INTRAVENOUS
  Filled 2021-06-21 (×38): qty 100

## 2021-06-21 NOTE — Progress Notes (Signed)
Patient ID: Logan Nelson, male   DOB: 1966/01/20, 56 y.o.   MRN: [id number] ?BP (!) 138/99 (BP Location: Left Arm)   Pulse 80   Temp 98.2 ?F (36.8 ?C) (Oral)   Resp 18   Ht 5' 5.98" (1.676 m)   Wt 67.3 kg   SpO2 100%   BMI 23.96 kg/m?  ?Will plan on OR given the assurance that he will be in a hospital setting until the abx course is finished. ?Surgery at C4/5. I do have serious misgivings about his lack of reliability, and doubt his new found religion regarding following medical recommendations. However under any other circumstance I would proceed with the operation and safeguards have been put in place. He needs dentistry to deal with his oral cavity as I believe it is a continuous source of bacterial spread.  ?Risks and benefits were discussed at great length with [name]. He voiced understanding and wishes to proceed. Will schedule for this week ?His current exam is unchanged, moving all extremities. He is at his baseline.  ?

## 2021-06-21 NOTE — Progress Notes (Addendum)
? ?[facility] Infectious Diseases Follow Up Note ? ?Patient Identification: ?Patient Name: Logan Nelson MRN: [id number] [place] Date: 06/16/2021  9:31 PM ?Age: 56 y.o.Today's Date: 06/21/2021 ? ?Reason for Visit: MSSA bacteremia  ? ?Principal Problem: ?  MSSA bacteremia ?Active Problems: ?  Hypertension ?  Protein-calorie malnutrition, severe ?  Anemia of chronic disease ?  Generalized weakness ?  Pressure injury of buttock, stage 2 ([place]) ?  Acute systolic (congestive) heart failure (HCC) ?  Prolonged QT interval ?  Epidural abscess ? ? ?Antibiotics:  ?Unasyn 5/10-c ?Cefepime 5/8, cefazolin 5/9 ?Metronidazole 5/8-5/10 ? ?Lines/Hardwares:  ? ?Interval Events: Continues to be afebrile, Leukocytosis up to 14.  MRI C-spine with C4-C5 epidural abscess with associated discitis resulting moderate to severe spinal stenosis, no cord edema.  Acute osteomyelitis of coccyx ? ? 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 ? ?Recommendations ?Continue Unasyn, plan to switch to cefazolin after 7 to 10 days course of Unasyn for infected decubitus ulcer ?Follow-up repeat blood cultures ?TEE postponed for 5/17 ?Monitor CBC and BMP ?For metastatic sites of infection ?Follow-up neurosurgery plans for surgical intervention ? ?Rest of the management as per the primary team. ?Thank you for the consult. Please page with pertinent questions or  concerns. ? ?______________________________________________________________________ ?Subjective ?patient seen and examined at the bedside.  ?Asking for analgesics ?Denies any increasing weakness numbness or tingling ?Back pain stable ? ?Vitals ?BP (!) 118/95 (BP Location: Left Arm)   Pulse 95   Temp (!) 97.4 ?F (36.3 ?C) (Oral)   Resp 16   Ht 5' 5.98" (1.676 m)   Wt 67.3 kg   SpO2 98%   BMI 23.96 kg/m?  ? ?  ?Physical Exam ?Constitutional: Lying in the bed and appears comfortable ?   Comments:  ? ?Cardiovascular:  ?   Rate and Rhythm: Normal rate and regular rhythm.  ?   Heart sounds:  ? ?Pulmonary:  ?   Effort: Pulmonary effort is normal on room air ?   Comments:  ? ?Abdominal:  ?   Palpations: Abdomen is soft.  ?   Tenderness: Nondistended and nontender ? ?Musculoskeletal:     ?   General: No swelling or tenderness in peripheral joints ? ?Skin: ?   Comments:  ? ?Neurological:  ?   General: Weakness in right upper extremity and right lower extremity seems to be stable.  Awake alert and oriented ? ?Psychiatric:     ?   Mood and Affect: Mood normal.  ? ? ?Pertinent Microbiology ?Results for orders placed or performed during the hospital encounter of 06/16/21  ?Culture, blood (Routine X 2) w Reflex to ID Panel     Status: None (Preliminary result)  ? Collection Time: 06/18/21 11:20 PM  ? Specimen: BLOOD  ?Result Value Ref Range Status  ? Specimen Description BLOOD LEFT ANTECUBITAL  Final  ? Special Requests   Final  ?  BOTTLES DRAWN AEROBIC AND ANAEROBIC Blood Culture results may not be optimal due to an inadequate volume of blood received in culture bottles  ? Culture   Final  ?  NO GROWTH 3 DAYS ?Performed at [facility], [place] [address] [place], [place] [address] ?  ? Report Status PENDING  Incomplete  ?Culture, blood (Routine X 2) w Reflex to ID Panel     Status: None (Preliminary result)  ? Collection Time: 06/18/21 11:30 PM  ? Specimen: BLOOD LEFT HAND  ?Result Value Ref Range Status  ? Specimen  Description BLOOD LEFT HAND  Final  ? Special Requests   Final  ?  BOTTLES DRAWN AEROBIC ONLY Blood Culture adequate volume  ? Culture   Final  ?  NO GROWTH 3 DAYS ?Performed at [facility], [place] [address], [place], [place] [address] ?  ? Report Status PENDING  Incomplete  ? ?Pertinent Lab. ? ?  Latest Ref Rng & Units 06/21/2021  ?  1:01 AM 06/18/2021  ?  2:46 AM 06/16/2021  ?  9:59 PM  ?CBC  ?WBC 4.0 - 10.5 K/uL 14.9   12.1   14.5    ?Hemoglobin 13.0 - 17.0 g/dL 10.8   10.4   9.2    ?Hematocrit 39.0 - 52.0 % 34.9   32.6   29.0    ?Platelets 150 - 400 K/uL 487   445   346    ? ? ?  Latest Ref Rng & Units 06/21/2021  ?  1:01 AM 06/18/2021  ?  2:46 AM 06/17/2021  ?  4:21 AM  ?CMP  ?Glucose 70 - 99 mg/dL 162   159     ?BUN 6 - 20 mg/dL 16   31     ?Creatinine 0.61 - 1.24 mg/dL 0.56   0.74     ?Sodium 135 - 145 mmol/L 138   138     ?Potassium 3.5 - 5.1 mmol/L 5.0   3.0     ?Chloride 98 - 111 mmol/L 109   106     ?CO2 22 - 32 mmol/L 23   24     ?Calcium 8.9 - 10.3 mg/dL 8.2   8.2     ?Total Protein 6.5 - 8.1 g/dL 5.8    6.8    ?Total Bilirubin 0.3 - 1.2 mg/dL 0.4    1.0    ?Alkaline Phos 38 - 126 U/L 75    89    ?AST 15 - 41 U/L 19    24    ?ALT 0 - 44 U/L 17    17    ? ? ? ?Pertinent Imaging today ?Plain films and CT images have been personally visualized and interpreted; radiology reports have been reviewed. Decision making incorporated into the Impression / Recommendations. ? ?MR CERVICAL SPINE W WO CONTRAST ? ?Result Date: 06/21/2021 ?CLINICAL DATA:  Initial evaluation for epidural abscess. EXAM: MRI CERVICAL SPINE WITHOUT AND WITH CONTRAST TECHNIQUE: Multiplanar and multiecho pulse sequences of the cervical spine, to include the craniocervical junction and cervicothoracic junction, were obtained without and with intravenous contrast. CONTRAST:  6.67mL GADAVIST GADOBUTROL 1 MMOL/ML IV SOLN COMPARISON:  Recent MRI from 06/18/2021. FINDINGS: Alignment: Reversal of the normal cervical lordosis with apex at C5. No  interval listhesis. Vertebrae: Stable vertebral body height without acute or interval fracture. Diffusely decreased T1/T2 signal intensity throughout the visualized marrow, likely related to patient's history of anemia. Again seen is a ventral epidural fluid collection along the midline extending from C3-4 to C5-6. This is relatively similar in size measuring 5 x 12 x 30 mm (AP by transverse by craniocaudad). Again, this is likely emanating from the  C4-5 disc space which is somewhat narrowed with internal fluid signal intensity. Reactive endplate changes about the C5-6 and C6-7 interspaces are similar to previous exams, and may be degenerative. Cord: Moderate cord flattening at the level of C4-5 with the thecal sac narrowed to approximately 6 mm in AP diameter, similar to previous. Cord flattening is slightly worse on the right (series 12, image 20). There is suspected subtle and/or early cord signal changes at this level, best seen on axial T2 weighted sequence (series 12, images 19, 20). Posterior Fossa, vertebral arteries, paraspinal tissues: Chronic pontine lacunar infarct noted. Craniocervical junction normal. Layering retropharyngeal effusion with diffuse anasarca throughout the external soft tissues. Normal flow voids seen within the vertebral arteries bilaterally. A left pleural effusion at the left lung apex is partially visualized. Disc levels: Moderate to severe spinal stenosis at C4-5 due to the epidural collection, relatively stable. Degenerative reactive endplate changes at 075-GRM and C6-7 without significant stenosis. No other new or progressive finding. IMPRESSION: 1. No significant interval change in size of ventral epidural fluid collection extending from C3-4 to C5-6, most consistent with epidural abscess. Resultant moderate to severe spinal stenosis at C4-5 with moderate cord flattening, similar. Suspected subtle/early cord signal changes as above. 2. Diffuse anasarca with left pleural effusion,  partially visualized. Electronically Signed   By: Jeannine Boga M.D.   On: 06/21/2021 01:17   ? ? ?I spent approx 55 minutes for this patient encounter including review of prior medical records, coordination of care with primary/other specialist wi

## 2021-06-21 NOTE — Progress Notes (Signed)
?PROGRESS NOTE ? ? ? ?Logan Nelson  [id number] DOB: December 03, 1965 DOA: 06/16/2021 ?PCP: [name], MD  ? ? ?Brief Narrative:  ?56 year old gentleman with history of hypertension, anasarca, recently admitted for incarcerated abdominal hernia with primary repair  Who left AGAINST MEDICAL ADVICE before he was placed to a rehab.  He subsequently again admitted from 4/4-5/11 with severe debility and malnutrition and failure to thrive.  While he was in the hospital, developed MSSA bacteremia and sepsis, back pain. Was under treatment but left AMA 5/10 just to come back due to profound weakness and now decided he will complete treatment. ? ? ?Assessment & Plan: ?  ?MSSA bacteremia developed while in the hospital, primary source unknown.  Likely sacral decubitus ulcer not present on admission. ?Blood cultures 5/8, MSSA ?Blood cultures 5/10, no growth so far ?Blood cultures 5/11, no growth so far. ?TTE negative for vegetation ?TEE postponed by cardiology.  Will inform them today. ?Currently remains on Unasyn and Ancef.  Followed by ID. ?MRI T-spine, L-spine, sacral spine with multilevel osteomyelitis, cervical abscess but no central cord compression.   ?With worsening pain 5/14, repeat MRI with cervical spine osteomyelitis.  Neurosurgery planning for discectomy and spinal fixation. ?Adequate pain medications.  Followed by ID. ?Anticipate prolonged IV antibiotics 6 to 8 weeks. ?With anterior cervical abscess, seen by ENT.  No ENT intervention needed. Pus evacuation with cervical discectomy. ? ?Acute systolic congestive heart failure: ?Echocardiogram 3/23 with ejection fraction 60 to 65% ?Repeat echocardiogram with 30 to 35%. on oral Lasix.  Euvolemic. ?Once infection work-up is completed, will need ischemia evaluation.  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 ?Given his clinical improvement, TEE will be best to evaluate repeat ejection fraction. ? ?Severe protein calorie  malnutrition: Augment nutrition. ? ?Severe debility, frailty: Work with PT OT .Refer to SNF.  Patient will need prolonged antibiotics.  May stay in the hospital. ? ? ?Pressure Injury 06/17/21 Sacrum Stage 3 -  Full thickness tissue loss. Subcutaneous fat may be visible but bone, tendon or muscle are NOT exposed. 2 in vertical by .5 in horizontal--approximately--wound tunneling and pink (Active)  ?06/17/21 1159  ?Location: Sacrum  ?Location Orientation:   ?Staging: Stage 3 -  Full thickness tissue loss. Subcutaneous fat may be visible but bone, tendon or muscle are NOT exposed.  ?Wound Description (Comments): 2 in vertical by .5 in horizontal--approximately--wound tunneling and pink  ?Present on Admission:   ? ?Essential hypertension: Blood pressure are stable. ? ?Anemia of chronic disease: 3 PRBC transfusion during previous hospitalization.  No active bleeding.  Continue to monitor. ? ?Hypomagnesemia: Replaced and adequate.  ? ? ?DVT prophylaxis: enoxaparin (LOVENOX) injection 40 mg Start: 06/21/21 1300 ?SCDs Start: 06/17/21 1144 ? ? ?Code Status: Full code ?Family Communication: None. ?Disposition Plan: Status is: Inpatient ?Remains inpatient appropriate because: Treatment for bacteremia.  Unsafe discharge. ?  ? ? ?Consultants:  ?Infectious disease ?Neurosurgery ?ENT, ? ?Procedures:  ?None ? ?Antimicrobials:  ?Unasyn 5/11--- ?Ancef 5/15--- ? ? ?Subjective: ? ?Patient seen and examined.  No overnight events.  Still complains of not having adequate pain control.  He has not received pain medications as prescribed. ?Discussed at bedside with neurosurgery, ID and ENT. ?Patient is agreeable to go to surgery.  He is also agreeable to complete treatment for as many months it takes to get better.   ? ? ?Objective: ?Vitals:  ? 06/20/21 1733 06/20/21 1748 06/20/21 1811 06/21/21 1000  ?BP: (!) 123/92 129/87 (!) 118/95   ?  Pulse: 67 86 95   ?Resp: 20 17 17 16   ?Temp: (!) 97.4 ?F (36.3 ?C) (!) 97.4 ?F (36.3 ?C) 97.8 ?F (36.6 ?C)  (!) 97.4 ?F (36.3 ?C)  ?TempSrc:   Oral Oral  ?SpO2: 95% 97% 98% 98%  ?Weight:      ?Height:      ? ? ?Intake/Output Summary (Last 24 hours) at 06/21/2021 1416 ?Last data filed at 06/21/2021 1315 ?Gross per 24 hour  ?Intake 1895.29 ml  ?Output 1885 ml  ?Net 10.29 ml  ? ?Filed Weights  ? 06/18/21 0600  ?Weight: 67.3 kg  ? ? ?Examination: ? ?General exam: Appears comfortable.  Talks about inadequate pain control in his neck. ?Alert oriented x4.  Blunted mood and affect. ?Extremely poor dentition.  No visible abscess. ?Respiratory system: Clear to auscultation. Respiratory effort normal. ?Cardiovascular system: S1 & S2 heard, RRR.  ?Gastrointestinal system: Soft.  Nontender.  Midline surgical scars clean and dry. ?Central nervous system: Alert and oriented.  Global weakness. ?Extremities: Difficulties moving both hands, left weaker than right.  Neck stiff with some fullness. ?Skin:  ?Stage II left buttock ?Stage III sacral pressure wound.  Dressing on.  Clean and dry. ? ? ? ?Data Reviewed: I have personally reviewed following labs and imaging studies ? ?CBC: ?Recent Labs  ?Lab 06/14/21 ?1655 06/16/21 ?0500 06/16/21 ?2159 06/18/21 ?0246 06/21/21 ?0101  ?WBC 13.1* 17.6* 14.5* 12.1* 14.9*  ?NEUTROABS 10.5*  --  10.8*  --  12.9*  ?HGB 9.2* 8.8* 9.2* 10.4* 10.8*  ?HCT 29.0* 27.4* 29.0* 32.6* 34.9*  ?MCV 91.8 91.3 91.8 91.1 94.6  ?PLT 351 338 346 445* 487*  ? ?Basic Metabolic Panel: ?Recent Labs  ?Lab 06/16/21 ?0500 06/16/21 ?2159 06/18/21 ?0246 06/20/21 ?1046 06/21/21 ?0101  ?NA 138 141 138  --  138  ?K 4.0 4.2 3.0*  --  5.0  ?CL 107 108 106  --  109  ?CO2 26 24 24   --  23  ?GLUCOSE 124* 92 159*  --  162*  ?BUN 33* 40* 31*  --  16  ?CREATININE 0.64 0.78 0.74  --  0.56*  ?CALCIUM 8.7* 9.0 8.2*  --  8.2*  ?MG 1.9  --   --  1.5*  --   ?PHOS  --   --   --   --  2.3*  ? ?GFR: ?Estimated Creatinine Clearance: 90.8 mL/min (A) (by C-G formula based on SCr of 0.56 mg/dL (L)). ?Liver Function Tests: ?Recent Labs  ?Lab 06/17/21 ?0421  06/21/21 ?0101  ?AST 24 19  ?ALT 17 17  ?ALKPHOS 89 75  ?BILITOT 1.0 0.4  ?PROT 6.8 5.8*  ?ALBUMIN 2.8* 2.3*  ? ?Recent Labs  ?Lab 06/17/21 ?0421  ?LIPASE 19  ? ?No results for input(s): AMMONIA in the last 168 hours. ?Coagulation Profile: ?No results for input(s): INR, PROTIME in the last 168 hours. ?Cardiac Enzymes: ?No results for input(s): CKTOTAL, CKMB, CKMBINDEX, TROPONINI in the last 168 hours. ?BNP (last 3 results) ?No results for input(s): PROBNP in the last 8760 hours. ?HbA1C: ?No results for input(s): HGBA1C in the last 72 hours. ?CBG: ?Recent Labs  ?Lab 06/18/21 ?2014  ?GLUCAP 120*  ? ?Lipid Profile: ?No results for input(s): CHOL, HDL, LDLCALC, TRIG, CHOLHDL, LDLDIRECT in the last 72 hours. ?Thyroid Function Tests: ?No results for input(s): TSH, T4TOTAL, FREET4, T3FREE, THYROIDAB in the last 72 hours. ?Anemia Panel: ?No results for input(s): VITAMINB12, FOLATE, FERRITIN, TIBC, IRON, RETICCTPCT in the last 72 hours. ?Sepsis Labs: ?Recent Labs  ?Lab  06/14/21 ?1655 06/15/21 ?0053 06/16/21 ?0500  ?PROCALCITON 1.03 1.34 5.33  ? ? ?Recent Results (from the past 240 hour(s))  ?Culture, blood (Routine X 2) w Reflex to ID Panel     Status: Abnormal  ? Collection Time: 06/14/21  4:45 PM  ? Specimen: BLOOD  ?Result Value Ref Range Status  ? Specimen Description BLOOD LEFT ANTECUBITAL  Final  ? Special Requests   Final  ?  BOTTLES DRAWN AEROBIC AND ANAEROBIC Blood Culture results may not be optimal due to an inadequate volume of blood received in culture bottles  ? Culture  Setup Time   Final  ?  GRAM POSITIVE COCCI IN CLUSTERS ?IN BOTH AEROBIC AND ANAEROBIC BOTTLES ?CRITICAL VALUE NOTED.  VALUE IS CONSISTENT WITH PREVIOUSLY REPORTED AND CALLED VALUE. ?  ? Culture (A)  Final  ?  STAPHYLOCOCCUS AUREUS ?SUSCEPTIBILITIES PERFORMED ON PREVIOUS CULTURE WITHIN THE LAST 5 DAYS. ?Performed at [facility], [place] [address], [place], [place] [address] ?  ? Report Status 06/17/2021 FINAL  Final  ?Culture, blood (Routine  X 2) w Reflex to ID Panel     Status: Abnormal  ? Collection Time: 06/14/21  4:55 PM  ? Specimen: BLOOD  ?Result Value Ref Range Status  ? Specimen Description BLOOD RIGHT ANTECUBITAL  Final  ? Special

## 2021-06-21 NOTE — Progress Notes (Signed)
OT Cancellation Note ? ?Patient Details ?Name: Malon Branton ?MRN: [id number] ?DOB: 1965/09/21 ? ? ?Cancelled Treatment:    Reason Eval/Treat Not Completed: Pain limiting ability to participate (Pt declining due to pain, "I just hurt everywhere." OT treatment to continue efforts) ? ?Greene Diodato A Brealynn Contino ?06/21/2021, 4:25 PM ?

## 2021-06-21 NOTE — Social Work (Addendum)
?  CSW following. Pt recently went AMA from hospital. Pt has a SNF recommendation but has no insurance and a history of substance use. SNF placement will be unlikely.  ? ?Jimmy Picket, LCSW ?Clinical Social Worker ? ?

## 2021-06-21 NOTE — Progress Notes (Signed)
?   06/21/21 1500  ?Clinical Encounter Type  ?Visited With Patient ?Engineer[employer] Representatives Sheria Lang and [place]))  ?Visit Type Initial;Psychological support;Spiritual support;Other (Comment) ?(Meeting with Family - Patient Organ Donor)  ?Referral From Nurse  ?Consult/Referral To Chaplain  ? ?Chaplain was told by patient that he felt as if he was not getting better might die. Patient has no family support; however, he has a 58 y.o. daughter and wants to live. Spoke with nurse Romeo Apple) to determine situation who confirmed that he has not had visitors. At patient's request, Chaplain spoke with Ms.Kesha Doyce Para (mother of daughter) who indicated that she will try to visit. Chaplain offered encouragement and prayer for patient. ? ?Jon Gills, Resident Chaplain ?([phone] ?

## 2021-06-21 NOTE — Consult Note (Signed)
Reason for Consult:Epidural abscess ?Referring Physician: Christella Noa ? ?Logan Nelson is an 56 y.o. male.  ?HPI: 56 yo with chronic neck pain and stable epidural abscess currently being treated with antibiotics.  MRI obtained yesterday: ? ?IMPRESSION: ?1. No significant interval change in size of ventral epidural fluid ?collection extending from C3-4 to C5-6, most consistent with ?epidural abscess. Resultant moderate to severe spinal stenosis at ?C4-5 with moderate cord flattening, similar. Suspected subtle/early ?cord signal changes as above. ? ? ?Past Medical History:  ?Diagnosis Date  ? Anemia   ? Arthritis   ? Bradycardia   ? Dyspnea   ? Falling episodes   ? GERD (gastroesophageal reflux disease)   ? Hernia of abdominal wall   ? Hypertension   ? Lower extremity edema   ? ? ?Past Surgical History:  ?Procedure Laterality Date  ? ABDOMINAL SURGERY    ? INGUINAL HERNIA REPAIR Right 04/30/2021  ? Procedure: HERNIA REPAIR INGUINAL WITH MESH;  Surgeon: Stechschulte, Nickola Major, MD;  Location: [facility];  Service: General;  Laterality: Right;  ? LYSIS OF ADHESION  04/30/2021  ? Procedure: LYSIS OF ADHESION;  Surgeon: Felicie Morn, MD;  Location: [facility];  Service: General;;  ? RADIOLOGY WITH ANESTHESIA N/A 06/16/2021  ? Procedure: MRI CERVICAL THORACIC LUMBER AND SACRUM;  Surgeon: Radiologist, Medication, MD;  Location: [facility];  Service: Radiology;  Laterality: N/A;  ? RADIOLOGY WITH ANESTHESIA N/A 06/18/2021  ? Procedure: MRI WITH ANESTHESIA;  Surgeon: Radiologist, Medication, MD;  Location: [facility];  Service: Radiology;  Laterality: N/A;  ? RADIOLOGY WITH ANESTHESIA N/A 06/20/2021  ? Procedure: MRI WITH ANESTHESIA;  Surgeon: Radiologist, Medication, MD;  Location: [facility];  Service: Radiology;  Laterality: N/A;  ? UMBILICAL HERNIA REPAIR N/A 04/30/2021  ? Procedure: OPEN UMBILICAL AND EPIGASTRIC HERNIA REPAIR;  Surgeon: Felicie Morn, MD;  Location: [facility];  Service: General;  Laterality: N/A;  ? ? ?History reviewed. No  pertinent family history. ? ?Social History:  reports that he quit smoking about 13 months ago. His smoking use included cigarettes. He has never used smokeless tobacco. He reports that he does not currently use alcohol. He reports that he does not currently use drugs. ? ?Allergies: No Known Allergies ? ?Medications: I have reviewed the patient's current medications. ? ?Results for orders placed or performed during the hospital encounter of 06/16/21 (from the past 48 hour(s))  ?Magnesium     Status: Abnormal  ? Collection Time: 06/20/21 10:46 AM  ?Result Value Ref Range  ? Magnesium 1.5 (L) 1.7 - 2.4 mg/dL  ?  Comment: Performed at [facility], [place] [address], [place], [place] [address]  ?CBC with Differential/Platelet     Status: Abnormal  ? Collection Time: 06/21/21  1:01 AM  ?Result Value Ref Range  ? WBC 14.9 (H) 4.0 - 10.5 K/uL  ? RBC 3.69 (L) 4.22 - 5.81 MIL/uL  ? Hemoglobin 10.8 (L) 13.0 - 17.0 g/dL  ? HCT 34.9 (L) 39.0 - 52.0 %  ? MCV 94.6 80.0 - 100.0 fL  ? MCH 29.3 26.0 - 34.0 pg  ? MCHC 30.9 30.0 - 36.0 g/dL  ? RDW 17.9 (H) 11.5 - 15.5 %  ? Platelets 487 (H) 150 - 400 K/uL  ? nRBC 0.0 0.0 - 0.2 %  ? Neutrophils Relative % 87 %  ? Neutro Abs 12.9 (H) 1.7 - 7.7 K/uL  ? Lymphocytes Relative 8 %  ? Lymphs Abs 1.2 0.7 - 4.0 K/uL  ? Monocytes  Relative 4 %  ? Monocytes Absolute 0.6 0.1 - 1.0 K/uL  ? Eosinophils Relative 0 %  ? Eosinophils Absolute 0.0 0.0 - 0.5 K/uL  ? Basophils Relative 0 %  ? Basophils Absolute 0.0 0.0 - 0.1 K/uL  ? Immature Granulocytes 1 %  ? Abs Immature Granulocytes 0.12 (H) 0.00 - 0.07 K/uL  ?  Comment: Performed at [facility], [place] [address], [place], [place] [address]  ?Comprehensive metabolic panel     Status: Abnormal  ? Collection Time: 06/21/21  1:01 AM  ?Result Value Ref Range  ? Sodium 138 135 - 145 mmol/L  ? Potassium 5.0 3.5 - 5.1 mmol/L  ? Chloride 109 98 - 111 mmol/L  ? CO2 23 22 - 32 mmol/L  ? Glucose, Bld 162 (H) 70 - 99 mg/dL  ?  Comment: Glucose reference  range applies only to samples taken after fasting for at least 8 hours.  ? BUN 16 6 - 20 mg/dL  ? Creatinine, Ser 0.56 (L) 0.61 - 1.24 mg/dL  ? Calcium 8.2 (L) 8.9 - 10.3 mg/dL  ? Total Protein 5.8 (L) 6.5 - 8.1 g/dL  ? Albumin 2.3 (L) 3.5 - 5.0 g/dL  ? AST 19 15 - 41 U/L  ? ALT 17 0 - 44 U/L  ? Alkaline Phosphatase 75 38 - 126 U/L  ? Total Bilirubin 0.4 0.3 - 1.2 mg/dL  ? GFR, Estimated >60 >60 mL/min  ?  Comment: (NOTE) ?Calculated using the CKD-EPI Creatinine Equation (2021) ?  ? Anion gap 6 5 - 15  ?  Comment: Performed at [facility], [place] [address] [place], [place] [address]  ?Phosphorus     Status: Abnormal  ? Collection Time: 06/21/21  1:01 AM  ?Result Value Ref Range  ? Phosphorus 2.3 (L) 2.5 - 4.6 mg/dL  ?  Comment: Performed at [facility], [place] [address], [place],  [address]  ? ? ?MR CERVICAL SPINE W WO CONTRAST ? ?Result Date: 06/21/2021 ?CLINICAL DATA:  Initial evaluation for epidural abscess. EXAM: MRI CERVICAL SPINE WITHOUT AND WITH CONTRAST TECHNIQUE: Multiplanar and multiecho pulse sequences of the cervical spine, to include the craniocervical junction and cervicothoracic junction, were obtained without and with intravenous contrast. CONTRAST:  6.65mL GADAVIST GADOBUTROL 1 MMOL/ML IV SOLN COMPARISON:  Recent MRI from 06/18/2021. FINDINGS: Alignment: Reversal of the normal cervical lordosis with apex at C5. No interval listhesis. Vertebrae: Stable vertebral body height without acute or interval fracture. Diffusely decreased T1/T2 signal intensity throughout the visualized marrow, likely related to patient's history of anemia. Again seen is a ventral epidural fluid collection along the midline extending from C3-4 to C5-6. This is relatively similar in size measuring 5 x 12 x 30 mm (AP by transverse by craniocaudad). Again, this is likely emanating from the C4-5 disc space which is somewhat narrowed with internal fluid signal intensity. Reactive endplate changes about the C5-6 and  C6-7 interspaces are similar to previous exams, and may be degenerative. Cord: Moderate cord flattening at the level of C4-5 with the thecal sac narrowed to approximately 6 mm in AP diameter, similar to previous. Cord flattening is slightly worse on the right (series 12, image 20). There is suspected subtle and/or early cord signal changes at this level, best seen on axial T2 weighted sequence (series 12, images 19, 20). Posterior Fossa, vertebral arteries, paraspinal tissues: Chronic pontine lacunar infarct noted. Craniocervical junction normal. Layering retropharyngeal effusion with diffuse anasarca throughout the external soft tissues.  Normal flow voids seen within the vertebral arteries bilaterally. A left pleural effusion at the left lung apex is partially visualized. Disc levels: Moderate to severe spinal stenosis at C4-5 due to the epidural collection, relatively stable. Degenerative reactive endplate changes at 075-GRM and C6-7 without significant stenosis. No other new or progressive finding. IMPRESSION: 1. No significant interval change in size of ventral epidural fluid collection extending from C3-4 to C5-6, most consistent with epidural abscess. Resultant moderate to severe spinal stenosis at C4-5 with moderate cord flattening, similar. Suspected subtle/early cord signal changes as above. 2. Diffuse anasarca with left pleural effusion, partially visualized. Electronically Signed   By: Jeannine Boga M.D.   On: 06/21/2021 01:17   ? ?Review of Systems ?Blood pressure (!) 118/95, pulse 95, temperature (!) 97.4 ?F (36.3 ?C), temperature source Oral, resp. rate 16, height 5' 5.98" (1.676 m), weight 67.3 kg, SpO2 98 %. ?Physical Exam ? ?AAO x 3 - NAD ?Speaks without SOB ?PERRL/EOMI ?Nose - atraumatic without septal deviation ?OC/OP - clear ?Neck - thick without skin erythema or edema ? ? ?Assessment/Plan: ? ?Epidural abscess ? ?Discussed with Dr. Christella Noa - will sign off from ENT standpoint.  Please let me  know if I can help. ? ?[phone] ? ?Boyce Medici. ?06/21/2021, 12:15 PM  ? ? ? ? ?

## 2021-06-21 NOTE — Progress Notes (Signed)
I went off call yesterday at 3pm, but saw the MRI this am. It is read as stable, but I think it looks a little tighter. I have communicated with Dr Franky Macho who will review the films as he assumes neurosurgical care today. ?

## 2021-06-22 ENCOUNTER — Encounter (HOSPITAL_COMMUNITY): Payer: Self-pay | Admitting: Internal Medicine

## 2021-06-22 ENCOUNTER — Other Ambulatory Visit: Payer: Self-pay | Admitting: Neurosurgery

## 2021-06-22 DIAGNOSIS — R7881 Bacteremia: Secondary | ICD-10-CM | POA: Diagnosis not present

## 2021-06-22 DIAGNOSIS — B9561 Methicillin susceptible Staphylococcus aureus infection as the cause of diseases classified elsewhere: Secondary | ICD-10-CM | POA: Diagnosis not present

## 2021-06-22 DIAGNOSIS — G061 Intraspinal abscess and granuloma: Secondary | ICD-10-CM | POA: Diagnosis not present

## 2021-06-22 DIAGNOSIS — M4642 Discitis, unspecified, cervical region: Secondary | ICD-10-CM | POA: Diagnosis not present

## 2021-06-22 DIAGNOSIS — M4802 Spinal stenosis, cervical region: Secondary | ICD-10-CM | POA: Diagnosis not present

## 2021-06-22 NOTE — Progress Notes (Signed)
?PROGRESS NOTE ? ? ? ?Logan RaringDarryl Keith Nelson  ZOX:[id number] DOB: 08/12/1965 DOA: 06/16/2021 ?PCP: Logan SkeansWilson, Amelia, MD  ? ? ?Brief Narrative:  ?56 year old gentleman with history of hypertension, anasarca, recently admitted for incarcerated abdominal hernia with primary repair  Who left AGAINST MEDICAL ADVICE before he was placed to a rehab.  He subsequently again admitted from 4/4-5/11 with severe debility and malnutrition and failure to thrive.  While he was in the hospital, developed MSSA bacteremia and sepsis, back pain. Was under treatment but left AMA 5/10 just to come back due to profound weakness and now decided he will complete treatment. ? ? ?Assessment & Plan: ?  ?MSSA bacteremia developed while in the hospital, primary source unknown.  Likely sacral decubitus ulcer not present on admission. ?Blood cultures 5/8, MSSA ?Blood cultures 5/10, no growth so far ?Blood cultures 5/11, no growth so far. ?TTE negative for vegetation ?TEE postponed by cardiology.  Cardiology was renotified as ENT cleared him. ?Currently remains on Unasyn and Ancef.  Followed by ID. ?MRI T-spine, L-spine, sacral spine with multilevel osteomyelitis, cervical abscess but no central cord compression.   ?With worsening pain 5/14, repeat MRI with cervical spine osteomyelitis.  Neurosurgery planning for discectomy and spinal fixation tomorrow. ?Adequate pain medications.  Followed by ID. ?Anticipate prolonged IV antibiotics 6 to 8 weeks. ?With anterior cervical abscess, seen by ENT.  No ENT intervention needed. Pus evacuation with cervical discectomy. ? ?Acute systolic congestive heart failure: ?Echocardiogram 3/23 with ejection fraction 60 to 65% ?Repeat echocardiogram with 30 to 35%. on oral Lasix.  Euvolemic. ?Once infection work-up is completed, will need ischemia evaluation.  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 ?Given his clinical improvement, TEE will be best to evaluate repeat ejection  fraction. ? ?Severe protein calorie malnutrition: Augment nutrition. ? ?Severe debility, frailty: Work with PT OT .Refer to SNF.  Patient will need prolonged antibiotics.  May stay in the hospital. ? ? ?Pressure Injury 06/17/21 Sacrum Stage 3 -  Full thickness tissue loss. Subcutaneous fat may be visible but bone, tendon or muscle are NOT exposed. 2 in vertical by .5 in horizontal--approximately--wound tunneling and pink (Active)  ?06/17/21 1159  ?Location: Sacrum  ?Location Orientation:   ?Staging: Stage 3 -  Full thickness tissue loss. Subcutaneous fat may be visible but bone, tendon or muscle are NOT exposed.  ?Wound Description (Comments): 2 in vertical by .5 in horizontal--approximately--wound tunneling and pink  ?Present on Admission:   ? ?Essential hypertension: Blood pressure are stable. ? ?Anemia of chronic disease: 3 PRBC transfusion during previous hospitalization.  No active bleeding.  Continue to monitor. ? ?Hypomagnesemia: Replaced and adequate.  ? ? ?DVT prophylaxis: enoxaparin (LOVENOX) injection 40 mg Start: 06/21/21 1300 ?SCDs Start: 06/17/21 1144 ? ? ?Code Status: Full code ?Family Communication: None. ?Disposition Plan: Status is: Inpatient ?Remains inpatient appropriate because: Treatment for bacteremia.  Unsafe discharge. ?  ? ? ?Consultants:  ?Infectious disease ?Neurosurgery ?ENT, ? ?Procedures:  ?None ? ?Antimicrobials:  ?Unasyn 5/11--- ?Ancef 5/15--- ? ? ?Subjective: ? ?Patient seen and examined.  Afebrile overnight.  Hurts everywhere.  He thinks he may "just die with the pain".  Denies any difficulty swallowing.  Denies any nausea or vomiting.  Bowel movements are normal. ? ?Objective: ?Vitals:  ? 06/21/21 1708 06/21/21 2118 06/22/21 0513 06/22/21 0734  ?BP: (!) 138/99 (!) 141/115 (!) 130/103 (!) 144/106  ?Pulse: 80 (!) 106 87 98  ?Resp: 18  16 19   ?Temp: 98.2 ?F (36.8 ?  C) 98 ?F (36.7 ?C) 98 ?F (36.7 ?C) 97.6 ?F (36.4 ?C)  ?TempSrc: Oral Oral Oral Oral  ?SpO2: 100% 100% 97% (!) 83%   ?Weight:      ?Height:      ? ? ?Intake/Output Summary (Last 24 hours) at 06/22/2021 1427 ?Last data filed at 06/21/2021 1708 ?Gross per 24 hour  ?Intake 120 ml  ?Output 200 ml  ?Net -80 ml  ? ?Filed Weights  ? 06/18/21 0600  ?Weight: 67.3 kg  ? ? ?Examination: ? ?General exam: Appears anxious.  Mild distress due to pain. ?Alert oriented x4.  Blunted mood and affect. ?Extremely poor dentition.  No visible abscess. ?Respiratory system: Clear to auscultation. Respiratory effort normal. ?Cardiovascular system: S1 & S2 heard, RRR.  ?Gastrointestinal system: Soft.  Nontender.  Midline surgical scars clean and dry. ?Central nervous system: Alert and oriented.  Global weakness. ?Extremities: Difficulties moving both hands, left weaker than right.  Neck stiff with some fullness. ?Skin:  ?Stage II left buttock ?Stage III sacral pressure wound.  Dressing on.  Clean and dry. ? ? ? ?Data Reviewed: I have personally reviewed following labs and imaging studies ? ?CBC: ?Recent Labs  ?Lab 06/16/21 ?0500 06/16/21 ?2159 06/18/21 ?0246 06/21/21 ?0101  ?WBC 17.6* 14.5* 12.1* 14.9*  ?NEUTROABS  --  10.8*  --  12.9*  ?HGB 8.8* 9.2* 10.4* 10.8*  ?HCT 27.4* 29.0* 32.6* 34.9*  ?MCV 91.3 91.8 91.1 94.6  ?PLT 338 346 445* 487*  ? ?Basic Metabolic Panel: ?Recent Labs  ?Lab 06/16/21 ?0500 06/16/21 ?2159 06/18/21 ?0246 06/20/21 ?1046 06/21/21 ?0101  ?NA 138 141 138  --  138  ?K 4.0 4.2 3.0*  --  5.0  ?CL 107 108 106  --  109  ?CO2 26 24 24   --  23  ?GLUCOSE 124* 92 159*  --  162*  ?BUN 33* 40* 31*  --  16  ?CREATININE 0.64 0.78 0.74  --  0.56*  ?CALCIUM 8.7* 9.0 8.2*  --  8.2*  ?MG 1.9  --   --  1.5*  --   ?PHOS  --   --   --   --  2.3*  ? ?GFR: ?Estimated Creatinine Clearance: 90.8 mL/min (A) (by C-G formula based on SCr of 0.56 mg/dL (L)). ?Liver Function Tests: ?Recent Labs  ?Lab 06/17/21 ?0421 06/21/21 ?0101  ?AST 24 19  ?ALT 17 17  ?ALKPHOS 89 75  ?BILITOT 1.0 0.4  ?PROT 6.8 5.8*  ?ALBUMIN 2.8* 2.3*  ? ?Recent Labs  ?Lab 06/17/21 ?0421   ?LIPASE 19  ? ?No results for input(s): AMMONIA in the last 168 hours. ?Coagulation Profile: ?No results for input(s): INR, PROTIME in the last 168 hours. ?Cardiac Enzymes: ?No results for input(s): CKTOTAL, CKMB, CKMBINDEX, TROPONINI in the last 168 hours. ?BNP (last 3 results) ?No results for input(s): PROBNP in the last 8760 hours. ?HbA1C: ?No results for input(s): HGBA1C in the last 72 hours. ?CBG: ?Recent Labs  ?Lab 06/18/21 ?2014  ?GLUCAP 120*  ? ?Lipid Profile: ?No results for input(s): CHOL, HDL, LDLCALC, TRIG, CHOLHDL, LDLDIRECT in the last 72 hours. ?Thyroid Function Tests: ?No results for input(s): TSH, T4TOTAL, FREET4, T3FREE, THYROIDAB in the last 72 hours. ?Anemia Panel: ?No results for input(s): VITAMINB12, FOLATE, FERRITIN, TIBC, IRON, RETICCTPCT in the last 72 hours. ?Sepsis Labs: ?Recent Labs  ?Lab 06/16/21 ?0500  ?PROCALCITON 5.33  ? ? ?Recent Results (from the past 240 hour(s))  ?Culture, blood (Routine X 2) w Reflex to ID Panel  Status: Abnormal  ? Collection Time: 06/14/21  4:45 PM  ? Specimen: BLOOD  ?Result Value Ref Range Status  ? Specimen Description BLOOD LEFT ANTECUBITAL  Final  ? Special Requests   Final  ?  BOTTLES DRAWN AEROBIC AND ANAEROBIC Blood Culture results may not be optimal due to an inadequate volume of blood received in culture bottles  ? Culture  Setup Time   Final  ?  GRAM POSITIVE COCCI IN CLUSTERS ?IN BOTH AEROBIC AND ANAEROBIC BOTTLES ?CRITICAL VALUE NOTED.  VALUE IS CONSISTENT WITH PREVIOUSLY REPORTED AND CALLED VALUE. ?  ? Culture (A)  Final  ?  STAPHYLOCOCCUS AUREUS ?SUSCEPTIBILITIES PERFORMED ON PREVIOUS CULTURE WITHIN THE LAST 5 DAYS. ?Performed at [facility], [place] [address] [place], [place] [address] ?  ? Report Status 06/17/2021 FINAL  Final  ?Culture, blood (Routine X 2) w Reflex to ID Panel     Status: Abnormal  ? Collection Time: 06/14/21  4:55 PM  ? Specimen: BLOOD  ?Result Value Ref Range Status  ? Specimen Description BLOOD RIGHT ANTECUBITAL   Final  ? Special Requests   Final  ?  BOTTLES DRAWN AEROBIC AND ANAEROBIC Blood Culture adequate volume  ? Culture  Setup Time   Final  ?  GRAM POSITIVE COCCI IN CLUSTERS ?IN BOTH AEROBIC AND ANAEROBIC BOTTLES ?CRITICAL

## 2021-06-22 NOTE — Plan of Care (Signed)

## 2021-06-22 NOTE — Anesthesia Preprocedure Evaluation (Addendum)
Anesthesia Evaluation  ?Patient identified by MRN, date of birth, ID band ?Patient awake ? ? ? ?Reviewed: ?Allergy & Precautions, NPO status , Patient's Chart, lab work & pertinent test results, reviewed documented beta blocker date and time  ? ?Airway ?Mallampati: II ? ?TM Distance: >3 FB ?Neck ROM: Full ? ? ? Dental ? ?(+) Loose,  ?  ?Pulmonary ?shortness of breath and with exertion, former smoker,  ?  ?Pulmonary exam normal ?breath sounds clear to auscultation ? ? ? ? ? ? Cardiovascular ?hypertension, Pt. on medications ?+CHF  ?Normal cardiovascular exam ?Rhythm:Regular Rate:Normal ? ?Echo 06/15/21 ?1. There is a mobile filamentous structure in the RA. 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 No valvular lesions are present to suggest endocarditis. Of note, this structure was present on the prior echo from March 2023.  ??2. LV myocardium is hyper trabeculated. Consider contrast echo or MRI to exclude noncompaction. Left ventricular ejection fraction, by estimation, is 30 to 35%. The left ventricle has moderately decreased function. The  ?left ventricle demonstrates global hypokinesis. Indeterminate diastolic filling due to E-A fusion.  ??3. Right ventricular systolic function is low normal. The right ventricular size is normal. There is normal pulmonary artery systolic pressure. The estimated right ventricular systolic pressure is 35.9 mmHg.  ??4. Left atrial size was severely dilated.  ??5. Mild to moderate mitral regurgitation is present on this study. The prior study demonstrated possible moderate to severe MR. Would recommend TEE for clarification. The mitral valve is grossly normal. Mild to moderate mitral valve regurgitation. No evidence of mitral stenosis.  ??6. The aortic valve is tricuspid. There is mild calcification of the aortic valve. Aortic valve regurgitation is not visualized. Aortic valve sclerosis is present, with no evidence of aortic valve stenosis.  ??7. The inferior  vena cava is normal in size with greater than 50% respiratory variability, suggesting right atrial pressure of 3 mmHg. ? ?EKG  ?Ectopic atrial rhythm, frequent PAC's, sinus pauses, LAFB, anterolateral t wave inversion, prolonged QT interval ?  ?Neuro/Psych ?Cervical epidural ascess ?CVA, Residual Symptoms negative psych ROS  ? GI/Hepatic ?GERD  Medicated,Anasarca ? ?  ?Endo/Other  ?negative endocrine ROSProtein calorie malnutrition-severe ? Renal/GU ?negative Renal ROS  ?negative genitourinary ?  ?Musculoskeletal ? ?(+) Arthritis , Osteoarthritis,  Cervical osteomyelitis ?Sacral decubitus ulcer  ? Abdominal ?  ?Peds ? Hematology ? ?(+) Blood dyscrasia, anemia , MSSA bacteremia   ?Anesthesia Other Findings ? ? Reproductive/Obstetrics ? ?  ? ? ? ? ? ? ? ? ? ? ? ? ? ?  ?  ? ? ? ? ? ? ? ?Anesthesia Physical ?Anesthesia Plan ? ?ASA: 3 ? ?Anesthesia Plan: General  ? ?Post-op Pain Management: Tylenol PO (pre-op)*, Ketamine IV*, Precedex and Dilaudid IV  ? ?Induction: Intravenous and Cricoid pressure planned ? ?PONV Risk Score and Plan: 3 and Treatment may vary due to age or medical condition, Ondansetron and Midazolam ? ?Airway Management Planned: Oral ETT ? ?Additional Equipment: None ? ?Intra-op Plan:  ? ?Post-operative Plan: Extubation in OR ? ?Informed Consent: I have reviewed the patients History and Physical, chart, labs and discussed the procedure including the risks, benefits and alternatives for the proposed anesthesia with the patient or authorized representative who has indicated his/her understanding and acceptance.  ? ? ? ?Dental advisory given ? ?Plan Discussed with: CRNA and Anesthesiologist ? ?Anesthesia Plan Comments:   ? ? ? ? ? ?Anesthesia Quick Evaluation ? ?

## 2021-06-22 NOTE — Progress Notes (Signed)
Patient ID: Logan Nelson, male   DOB: 08-12-1965, 56 y.o.   MRN: [id number] ?BP (!) 137/92 (BP Location: Right Arm)   Pulse 92   Temp 98.8 ?F (37.1 ?C) (Oral)   Resp 17   Ht 5' 5.98" (1.676 m)   Wt 67.3 kg   SpO2 97%   BMI 23.96 kg/m?  ?Alert and oriented x4 speech is clear and fluent ?Moving all extremities ?For OR tomorrow, ACDF C4/5 ?BP (!) 137/92 (BP Location: Right Arm)   Pulse 92   Temp 98.8 ?F (37.1 ?C) (Oral)   Resp 17   Ht 5' 5.98" (1.676 m)   Wt 67.3 kg   SpO2 97%   BMI 23.96 kg/m?  ?Logan Nelson has decided to undergo an anterior cervical decompression and arthrodesis for abscess at levels C4/5. Risks and benefits including but not limited to bleeding, infection, paralysis, weakness in one or both extremities, bowel and/or bladder dysfunction, fusion failure, hardware failure, need for further surgery, no relief of pain. He understands and wishes to proceed.  ?

## 2021-06-22 NOTE — Progress Notes (Addendum)
Mobility Specialist Progress Note: ? ? 06/22/21 1130  ?Mobility  ?Activity Ambulated with assistance in room;Transferred from chair to bed  ?Level of Assistance Minimal assist, patient does 75% or more  ?Assistive Device  ?(HHA)  ?Distance Ambulated (ft) 4 ft  ?Activity Response Tolerated well  ?$Mobility charge 1 Mobility  ? ?Pt received in chair asking to go back to bed. Complaints of "pain all over". Left in bed with call bell in reach and all needs met.  ? ?Tuere Nwosu ?Mobility Specialist ?Primary Phone [phone] ? ?

## 2021-06-22 NOTE — Progress Notes (Signed)
Mobility Specialist Progress Note: ? ? 06/22/21 0950  ?Mobility  ?Activity Ambulated with assistance in room;Transferred from bed to chair  ?Level of Assistance Minimal assist, patient does 75% or more  ?Assistive Device  ?(HHA)  ?Distance Ambulated (ft) 4 ft  ?Activity Response Tolerated well  ?$Mobility charge 1 Mobility  ? ?Pt received in bed willing to sit in chair for breakfast. Complaints of "hurting all over". Left in chair with call bell in reach and all needs met.  ? ?Logan Nelson ?Mobility Specialist ?Primary Phone [phone] ? ?

## 2021-06-22 NOTE — Progress Notes (Signed)
Physical Therapy Treatment ?Patient Details ?Name: Logan Nelson ?MRN: [id number] ?DOB: 06-27-1965 ?Today's Date: 06/22/2021 ? ? ?History of Present Illness Pt is 56 yr old M admitted on 06/16/21 with c/o weakness and back pain.  Recent admissions for hernia repair and B LE weakness/falls where pt has left AMA both times.  Imaging (+) for C4-5 abcess and OM of coccyx.  No surgical intervention planned at this time. PMH: anemia, arthritis, bradycardia, falls, HTN ? ?  ?PT Comments  ? ? Pt received supine and agreeable to session with encouragement. Pt demonstrating ability to come to sitting EOB with min a to elevate trunk secondary to pain and general fatigue. Pt requiring min guard for transfers and throughout ambulation for safety. Pt demonstrating ability to static stand for extended time without LOB. Pt declining further mobility secondary to pain, and requesting back to bed. Educated pt on benefits of continued mobility, pt verbalizing understanding. Pt continues to benefit from skilled PT services to progress toward functional mobility goals.   ?  ?Recommendations for follow up therapy are one component of a multi-disciplinary discharge planning process, led by the attending physician.  Recommendations may be updated based on patient status, additional functional criteria and insurance authorization. ? ?Follow Up Recommendations ? Skilled nursing-short term rehab (<3 hours/day) ?  ?  ?Assistance Recommended at Discharge Frequent or constant Supervision/Assistance  ?Patient can return home with the following A lot of help with walking and/or transfers;A lot of help with bathing/dressing/bathroom;Assistance with cooking/housework;Assist for transportation;Help with stairs or ramp for entrance ?  ?Equipment Recommendations ?  (Poor historian but it sounds like he has RW; w/c is in room)  ?  ?Recommendations for Other Services Rehab consult ? ? ?  ?Precautions / Restrictions Precautions ?Precautions:  Fall ?Precaution Comments: bilat LE edema, R UE edema, distending abdomen ?Restrictions ?Weight Bearing Restrictions: No  ?  ? ?Mobility ? Bed Mobility ?Overal bed mobility: Needs Assistance ?Bed Mobility: Supine to Sit, Sit to Supine ?  ?  ?Supine to sit: Min assist ?Sit to supine: Min assist ?  ?General bed mobility comments: min a to elevate trunk, refusing to reposition in bed at end of session, close to edge and with poor alignment ?  ? ?Transfers ?Overall transfer level: Needs assistance ?Equipment used: Rolling walker (2 wheels) ?Transfers: Sit to/from Stand ?Sit to Stand: Min guard ?  ?  ?  ?  ?  ?General transfer comment: pt initially unsteady, fair standing balance with RW support for extended time for peri-care ?  ? ?Ambulation/Gait ?Ambulation/Gait assistance: Min guard ?Gait Distance (Feet): 90 Feet ?Assistive device: Rolling walker (2 wheels) ?Gait Pattern/deviations: Step-through pattern, Decreased stride length, Decreased step length - right, Decreased step length - left, Trunk flexed, Narrow base of support ?Gait velocity: slightly decreased ?  ?  ?General Gait Details: no LOB, c/o general pain and fatigue throughout, distance limited to pt stated toelrance ? ? ?Stairs ?  ?  ?  ?  ?  ? ? ?Wheelchair Mobility ?  ? ?Modified Rankin (Stroke Patients Only) ?  ? ? ?  ?Balance Overall balance assessment: Needs assistance ?Sitting-balance support: Feet supported, Bilateral upper extremity supported ?Sitting balance-Leahy Scale: Fair ?Sitting balance - Comments: sitting EOB on air matress ?Postural control: Posterior lean ?Standing balance support: Bilateral upper extremity supported, During functional activity (RW) ?Standing balance-Leahy Scale: Poor ?Standing balance comment: pt able to maintain static standing balance with supervision but required min guard for dynamic standing balance. ?  ?  ?  ?  ?  ?  ?  ?  ?  ?  ?  ?  ? ?  ?  Cognition Arousal/Alertness: Awake/alert ?Behavior During Therapy: Flat  affect ?Overall Cognitive Status: Difficult to assess ?Area of Impairment: Safety/judgement, Awareness ?  ?  ?  ?  ?  ?  ?  ?  ?Orientation Level: Disoriented to ?Current Attention Level: Selective ?Memory: Decreased short-term memory ?Following Commands: Follows one step commands with increased time ?Safety/Judgement: Decreased awareness of safety ?Awareness: Intellectual ?Problem Solving: Requires verbal cues, Requires tactile cues ?  ?  ?  ? ?  ?Exercises   ? ?  ?General Comments   ?  ?  ? ?Pertinent Vitals/Pain Pain Assessment ?Pain Assessment: Faces ?Faces Pain Scale: Hurts even more ?Pain Location: "everywehre" ?Pain Descriptors / Indicators: Guarding, Cramping ?Pain Intervention(s): Limited activity within patient's tolerance, Monitored during session, Repositioned  ? ? ?Home Living   ?  ?  ?  ?  ?  ?  ?  ?  ?  ?   ?  ?Prior Function    ?  ?  ?   ? ?PT Goals (current goals can now be found in the care plan section) Acute Rehab PT Goals ?Patient Stated Goal: Pt's goal is to decrease pain ?PT Goal Formulation: With patient ?Time For Goal Achievement: 07/03/21 ? ?  ?Frequency ? ? ? Min 2X/week ? ? ? ?  ?PT Plan Current plan remains appropriate  ? ? ?Co-evaluation   ?  ?  ?  ?  ? ?  ?AM-PAC PT "6 Clicks" Mobility   ?Outcome Measure ? Help needed turning from your back to your side while in a flat bed without using bedrails?: A Lot ?Help needed moving from lying on your back to sitting on the side of a flat bed without using bedrails?: A Lot ?Help needed moving to and from a bed to a chair (including a wheelchair)?: A Lot ?Help needed standing up from a chair using your arms (e.g., wheelchair or bedside chair)?: A Lot ?Help needed to walk in hospital room?: A Lot ?Help needed climbing 3-5 steps with a railing? : A Lot ?6 Click Score: 12 ? ?  ?End of Session Equipment Utilized During Treatment: Gait belt ?Activity Tolerance: Patient limited by pain ?Patient left: in bed;with call bell/phone within reach ?Nurse  Communication: Mobility status ?PT Visit Diagnosis: Other abnormalities of gait and mobility (R26.89);History of falling (Z91.81) ?Pain - Right/Left: Left ?Pain - part of body: Shoulder ?  ? ? ?Time: [id number] ?PT Time Calculation (min) (ACUTE ONLY): 15 min ? ?Charges:  $Gait Training: 8-22 mins          ?          ? ?Audry Riles. PTA ?Acute Rehabilitation Services ?Office: [phone] ? ? ? ?Betsey Holiday Carrieann Spielberg ?06/22/2021, 2:21 PM ? ?

## 2021-06-23 ENCOUNTER — Inpatient Hospital Stay (HOSPITAL_COMMUNITY): Payer: Medicaid Other | Admitting: Anesthesiology

## 2021-06-23 ENCOUNTER — Encounter (HOSPITAL_COMMUNITY)
Admission: EM | Disposition: A | Discharge status: Skilled Nursing Facility | Payer: Self-pay | Source: Home / Self Care | Attending: Internal Medicine

## 2021-06-23 ENCOUNTER — Other Ambulatory Visit: Payer: Self-pay

## 2021-06-23 ENCOUNTER — Encounter (HOSPITAL_COMMUNITY): Payer: Self-pay | Admitting: Internal Medicine

## 2021-06-23 ENCOUNTER — Inpatient Hospital Stay (HOSPITAL_COMMUNITY): Payer: Medicaid Other

## 2021-06-23 DIAGNOSIS — G061 Intraspinal abscess and granuloma: Secondary | ICD-10-CM | POA: Diagnosis not present

## 2021-06-23 DIAGNOSIS — Z981 Arthrodesis status: Secondary | ICD-10-CM | POA: Diagnosis not present

## 2021-06-23 DIAGNOSIS — I5021 Acute systolic (congestive) heart failure: Secondary | ICD-10-CM | POA: Diagnosis not present

## 2021-06-23 DIAGNOSIS — I509 Heart failure, unspecified: Secondary | ICD-10-CM | POA: Diagnosis not present

## 2021-06-23 DIAGNOSIS — B9561 Methicillin susceptible Staphylococcus aureus infection as the cause of diseases classified elsewhere: Secondary | ICD-10-CM | POA: Diagnosis not present

## 2021-06-23 DIAGNOSIS — D638 Anemia in other chronic diseases classified elsewhere: Secondary | ICD-10-CM

## 2021-06-23 DIAGNOSIS — R531 Weakness: Secondary | ICD-10-CM

## 2021-06-23 DIAGNOSIS — I110 Hypertensive heart disease with heart failure: Secondary | ICD-10-CM

## 2021-06-23 DIAGNOSIS — I6990 Unspecified sequelae of unspecified cerebrovascular disease: Secondary | ICD-10-CM | POA: Diagnosis not present

## 2021-06-23 DIAGNOSIS — Z8669 Personal history of other diseases of the nervous system and sense organs: Secondary | ICD-10-CM | POA: Diagnosis not present

## 2021-06-23 DIAGNOSIS — D649 Anemia, unspecified: Secondary | ICD-10-CM | POA: Diagnosis not present

## 2021-06-23 DIAGNOSIS — R7881 Bacteremia: Secondary | ICD-10-CM | POA: Diagnosis not present

## 2021-06-23 DIAGNOSIS — M4322 Fusion of spine, cervical region: Secondary | ICD-10-CM | POA: Diagnosis not present

## 2021-06-23 HISTORY — PX: ANTERIOR CERVICAL DECOMP/DISCECTOMY FUSION: SHX1161

## 2021-06-23 LAB — COMPREHENSIVE METABOLIC PANEL
ALT: 13 U/L (ref 0–44)
AST: 20 U/L (ref 15–41)
Albumin: 2.3 g/dL — ABNORMAL LOW (ref 3.5–5.0)
Alkaline Phosphatase: 72 U/L (ref 38–126)
Anion gap: 7 (ref 5–15)
BUN: 12 mg/dL (ref 6–20)
CO2: 25 mmol/L (ref 22–32)
Calcium: 8.3 mg/dL — ABNORMAL LOW (ref 8.9–10.3)
Chloride: 104 mmol/L (ref 98–111)
Creatinine, Ser: 0.51 mg/dL — ABNORMAL LOW (ref 0.61–1.24)
GFR, Estimated: >60 mL/min (ref >60)
Glucose, Bld: 78 mg/dL (ref 70–99)
Potassium: 4 mmol/L (ref 3.5–5.1)
Sodium: 136 mmol/L (ref 135–145)
Total Bilirubin: 0.8 mg/dL (ref 0.3–1.2)
Total Protein: 5.6 g/dL — ABNORMAL LOW (ref 6.5–8.1)

## 2021-06-23 LAB — CBC WITH DIFFERENTIAL/PLATELET
Abs Immature Granulocytes: 0.1 10*3/uL — ABNORMAL HIGH (ref 0.00–0.07)
Basophils Absolute: 0 10*3/uL (ref 0.0–0.1)
Basophils Relative: 0 %
Eosinophils Absolute: 0.3 10*3/uL (ref 0.0–0.5)
Eosinophils Relative: 2 %
HCT: 31.3 % — ABNORMAL LOW (ref 39.0–52.0)
Hemoglobin: 10 g/dL — ABNORMAL LOW (ref 13.0–17.0)
Immature Granulocytes: 1 %
Lymphocytes Relative: 19 %
Lymphs Abs: 2.5 10*3/uL (ref 0.7–4.0)
MCH: 29.6 pg (ref 26.0–34.0)
MCHC: 31.9 g/dL (ref 30.0–36.0)
MCV: 92.6 fL (ref 80.0–100.0)
Monocytes Absolute: 1.4 10*3/uL — ABNORMAL HIGH (ref 0.1–1.0)
Monocytes Relative: 10 %
Neutro Abs: 9.1 10*3/uL — ABNORMAL HIGH (ref 1.7–7.7)
Neutrophils Relative %: 68 %
Platelets: 458 10*3/uL — ABNORMAL HIGH (ref 150–400)
RBC: 3.38 MIL/uL — ABNORMAL LOW (ref 4.22–5.81)
RDW: 17.9 % — ABNORMAL HIGH (ref 11.5–15.5)
WBC: 13.3 10*3/uL — ABNORMAL HIGH (ref 4.0–10.5)
nRBC: 0 % (ref 0.0–0.2)

## 2021-06-23 LAB — TYPE AND SCREEN
ABO/RH(D): O POS
Antibody Screen: NEGATIVE

## 2021-06-23 LAB — PHOSPHORUS: Phosphorus: 3.3 mg/dL (ref 2.5–4.6)

## 2021-06-23 LAB — CULTURE, BLOOD (ROUTINE X 2)
Culture: NO GROWTH
Culture: NO GROWTH
Special Requests: ADEQUATE

## 2021-06-23 LAB — SURGICAL PCR SCREEN
MRSA, PCR: NEGATIVE
Staphylococcus aureus: NEGATIVE

## 2021-06-23 LAB — MAGNESIUM: Magnesium: 1.6 mg/dL — ABNORMAL LOW (ref 1.7–2.4)

## 2021-06-23 IMAGING — CR DG CERVICAL SPINE 2 OR 3 VIEWS
4 series · 4 of 4 positions shown · non-contrast
Comparison: MRI [DATE].

CLINICAL DATA: Cervical decompression and epidural abscess
evacuation.

EXAM:
CERVICAL SPINE - 2-3 VIEW

[AP (1 of 4)]
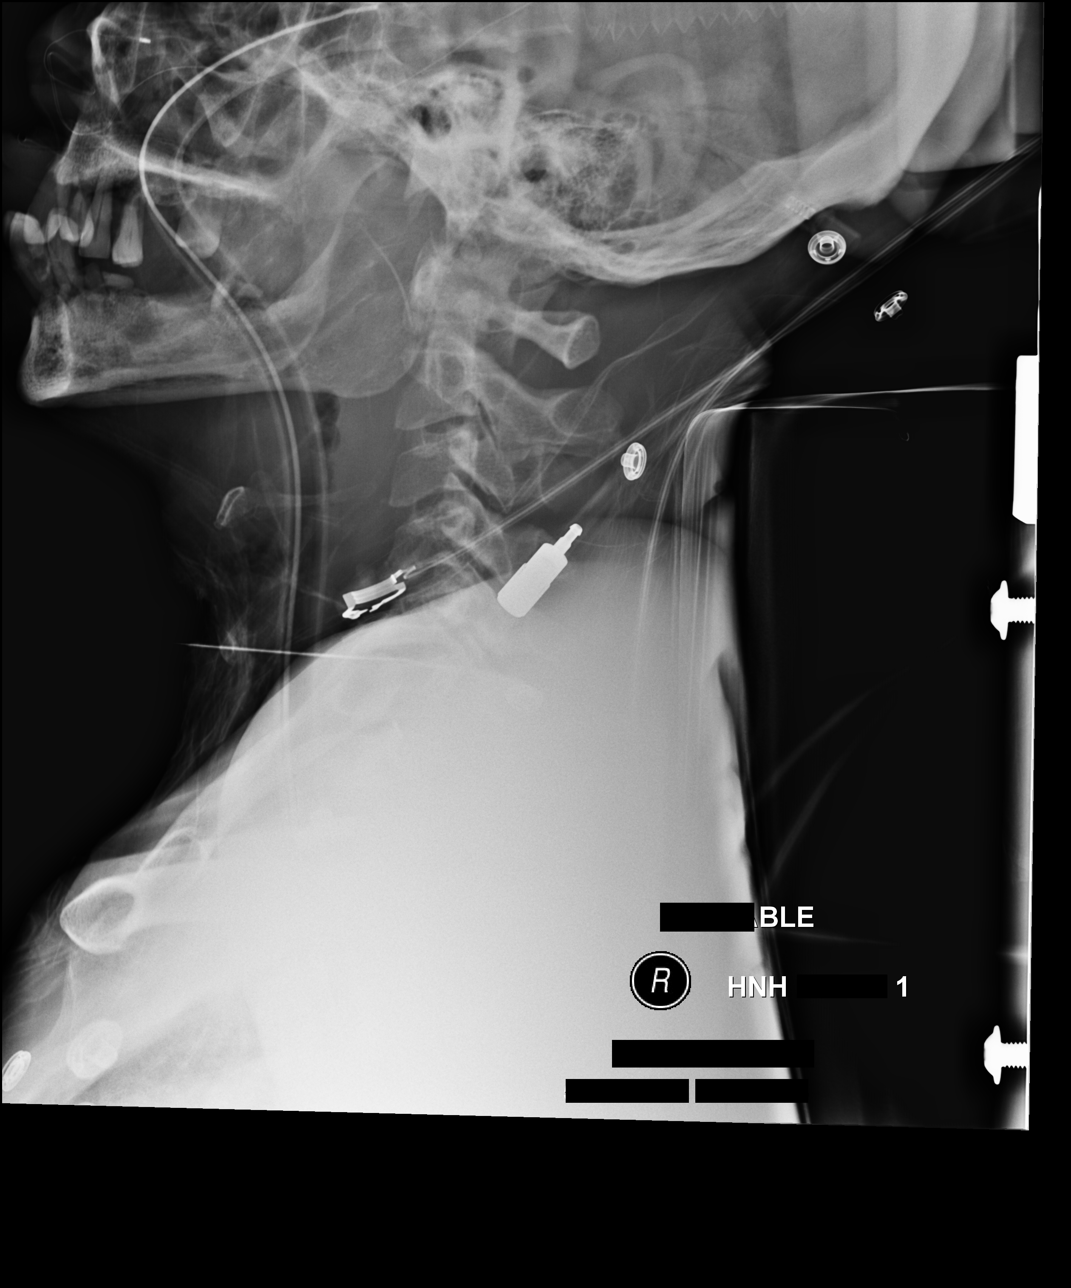

[AP (2 of 4)]
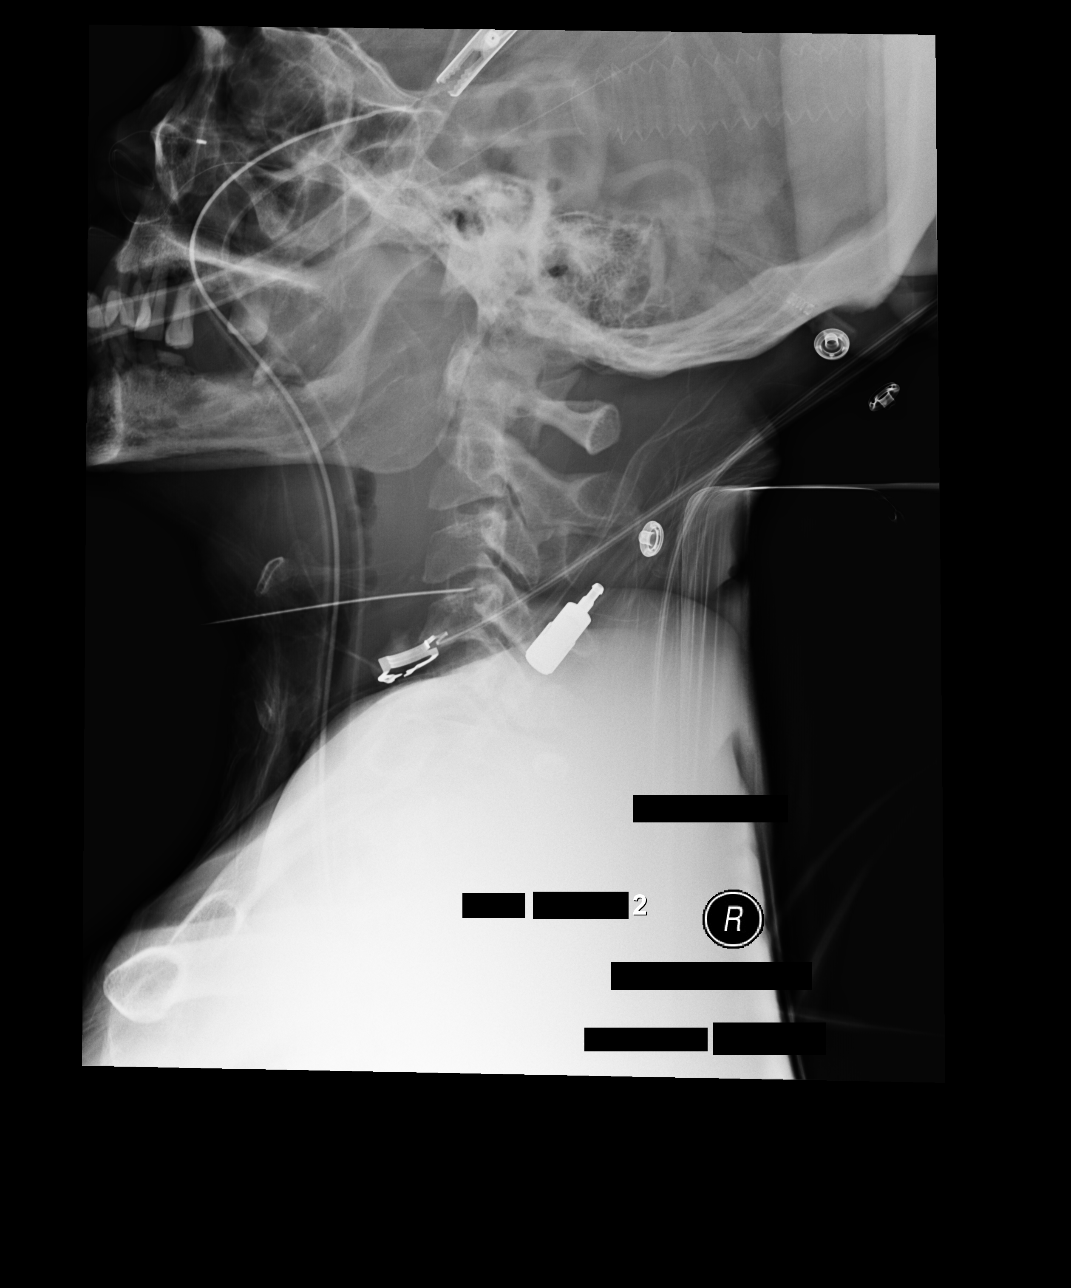

[AP (3 of 4)]
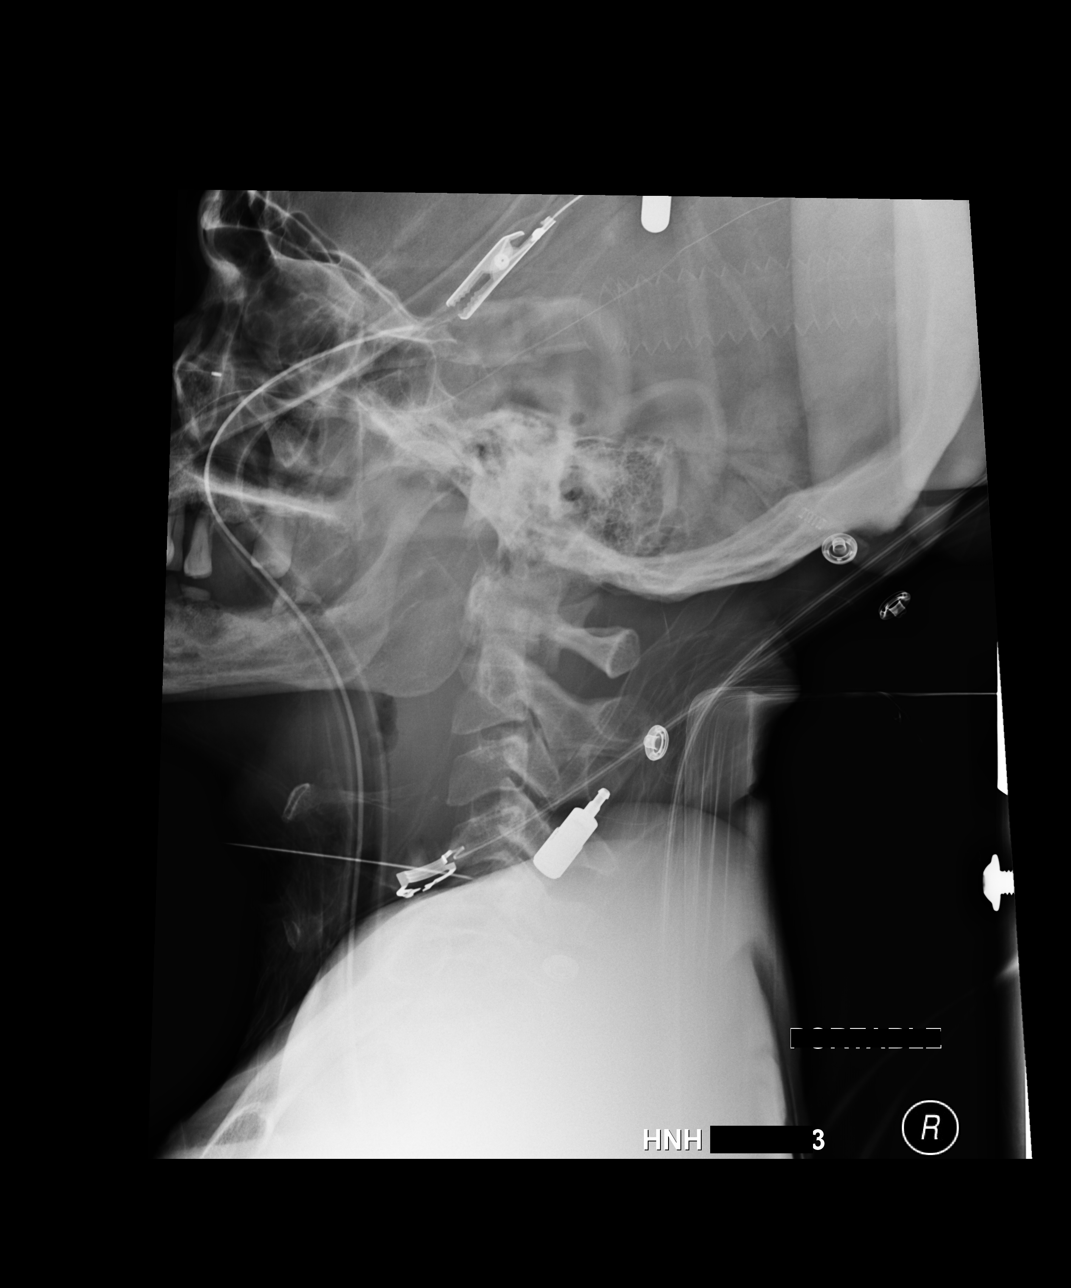

[AP (4 of 4)]
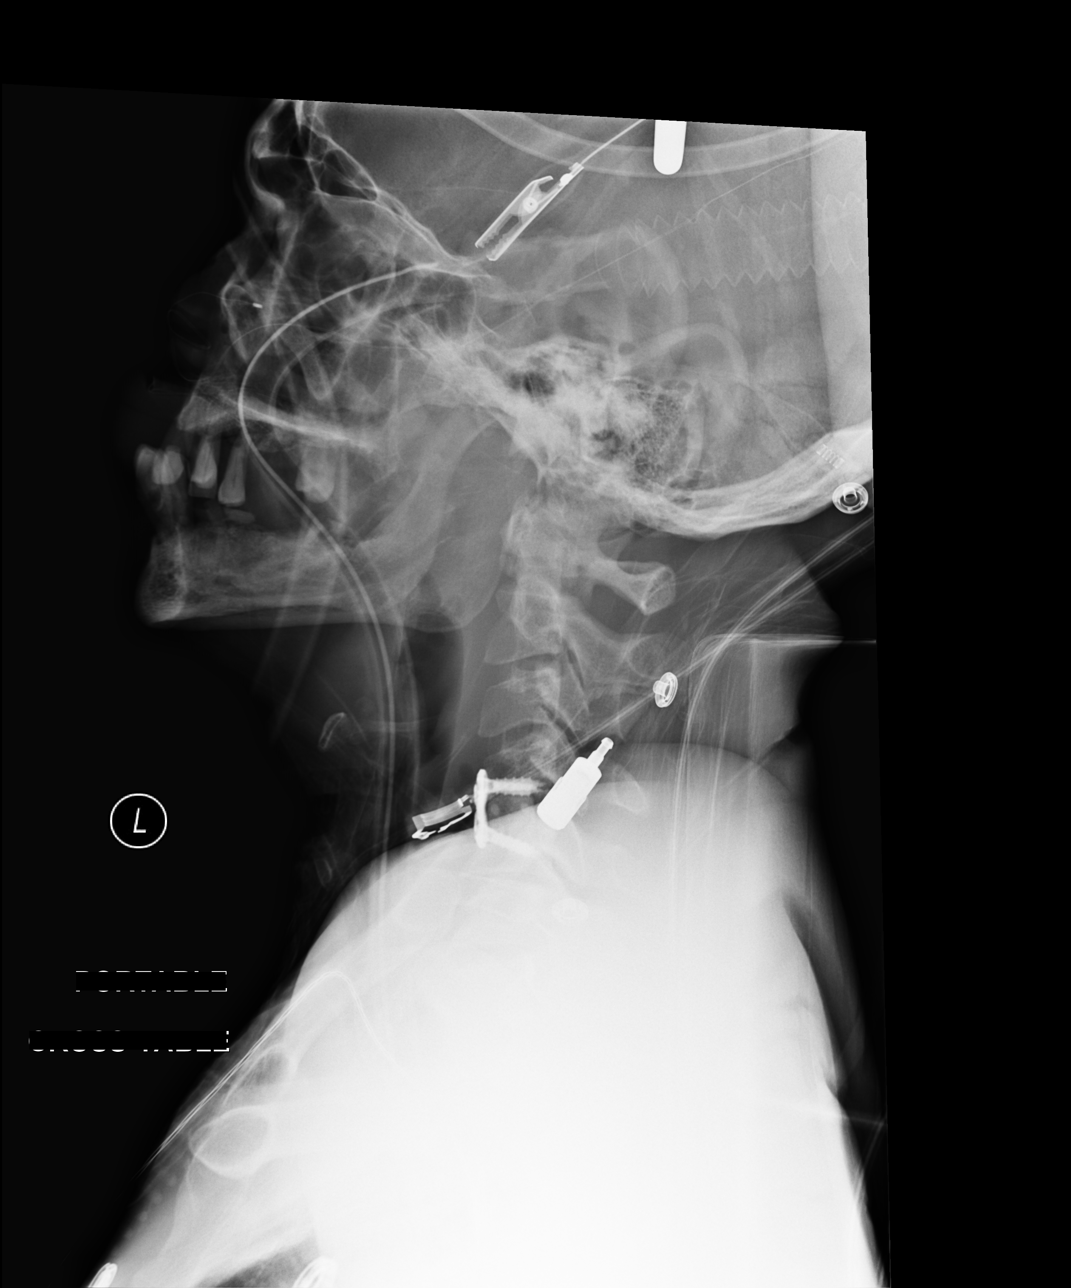

[4 of 4 positions shown; findings below may reference images not displayed]

FINDINGS: Four intraoperative cross-table lateral projections were obtained of
the cervical spine. The first image demonstrates surgical probe at
approximately the C5 level. The second image demonstrates surgical
probe at the C3-4 level. The third image demonstrates surgical probe
at the C4-5 level. The fourth image demonstrates the patient be
status post surgical anterior fusion of C4-5.
IMPRESSION: Surgical localization during surgical anterior fusion of C4-5.

## 2021-06-23 SURGERY — ECHOCARDIOGRAM, TRANSESOPHAGEAL
Anesthesia: General

## 2021-06-23 SURGERY — ANTERIOR CERVICAL DECOMPRESSION/DISCECTOMY FUSION 1 LEVEL
Anesthesia: General | Site: Spine Cervical

## 2021-06-23 MED ORDER — MENTHOL 3 MG MT LOZG: 1.0000 | LOZENGE | OROMUCOSAL | Status: DC | PRN

## 2021-06-23 MED ORDER — PROPOFOL 10 MG/ML IV BOLUS
INTRAVENOUS | Status: AC
  Filled 2021-06-23: qty 20

## 2021-06-23 MED ORDER — ACETAMINOPHEN 325 MG PO TABS: 650.0000 mg | ORAL_TABLET | ORAL | Status: DC | PRN

## 2021-06-23 MED ORDER — FENTANYL CITRATE (PF) 250 MCG/5ML IJ SOLN
INTRAMUSCULAR | Status: DC | PRN
Start: 2021-06-23 — End: 2021-06-23
  Administered 2021-06-23 (×2): 50 ug via INTRAVENOUS

## 2021-06-23 MED ORDER — ROCURONIUM BROMIDE 10 MG/ML (PF) SYRINGE
PREFILLED_SYRINGE | INTRAVENOUS | Status: DC | PRN
  Administered 2021-06-23 (×2): 50 mg via INTRAVENOUS

## 2021-06-23 MED ORDER — LIDOCAINE 2% (20 MG/ML) 5 ML SYRINGE
INTRAMUSCULAR | Status: AC
  Filled 2021-06-23: qty 5

## 2021-06-23 MED ORDER — SODIUM CHLORIDE 0.9 % IV SOLN
INTRAVENOUS | Status: DC | PRN
  Administered 2021-06-23: 3 g via INTRAVENOUS

## 2021-06-23 MED ORDER — PHENOL 1.4 % MT LIQD: 1.0000 | OROMUCOSAL | Status: DC | PRN

## 2021-06-23 MED ORDER — VANCOMYCIN HCL 1000 MG IV SOLR
INTRAVENOUS | Status: AC
  Filled 2021-06-23: qty 20

## 2021-06-23 MED ORDER — DEXAMETHASONE SODIUM PHOSPHATE 10 MG/ML IJ SOLN
INTRAMUSCULAR | Status: AC
  Filled 2021-06-23: qty 1

## 2021-06-23 MED ORDER — VASOPRESSIN 20 UNIT/ML IV SOLN
INTRAVENOUS | Status: AC
  Filled 2021-06-23: qty 1

## 2021-06-23 MED ORDER — ROCURONIUM BROMIDE 10 MG/ML (PF) SYRINGE
PREFILLED_SYRINGE | INTRAVENOUS | Status: AC
  Filled 2021-06-23: qty 10

## 2021-06-23 MED ORDER — HYDROMORPHONE HCL 1 MG/ML IJ SOLN
0.5000 mg | Freq: Once | INTRAMUSCULAR | Status: AC
  Administered 2021-06-23: 0.5 mg via INTRAVENOUS
  Filled 2021-06-23: qty 0.5

## 2021-06-23 MED ORDER — PHENYLEPHRINE HCL-NACL 20-0.9 MG/250ML-% IV SOLN
INTRAVENOUS | Status: DC | PRN
  Administered 2021-06-23: 50 ug/min via INTRAVENOUS

## 2021-06-23 MED ORDER — CHLORHEXIDINE GLUCONATE 0.12 % MT SOLN
OROMUCOSAL | Status: AC
  Administered 2021-06-23: 15 mL
  Filled 2021-06-23: qty 15

## 2021-06-23 MED ORDER — LIDOCAINE-EPINEPHRINE 0.5 %-1:200000 IJ SOLN
INTRAMUSCULAR | Status: DC | PRN
  Administered 2021-06-23: 5 mL

## 2021-06-23 MED ORDER — 0.9 % SODIUM CHLORIDE (POUR BTL) OPTIME
TOPICAL | Status: DC | PRN
  Administered 2021-06-23: 1000 mL

## 2021-06-23 MED ORDER — EPHEDRINE 5 MG/ML INJ
INTRAVENOUS | Status: AC
  Filled 2021-06-23: qty 5

## 2021-06-23 MED ORDER — ACETAMINOPHEN 650 MG RE SUPP: 650.0000 mg | RECTAL | Status: DC | PRN

## 2021-06-23 MED ORDER — HEMOSTATIC AGENTS (NO CHARGE) OPTIME
TOPICAL | Status: DC | PRN
  Administered 2021-06-23: 1 via TOPICAL

## 2021-06-23 MED ORDER — SODIUM CHLORIDE 0.9% FLUSH: 3.0000 mL | INTRAVENOUS | Status: DC | PRN

## 2021-06-23 MED ORDER — OXYCODONE HCL 5 MG PO TABS: 5.0000 mg | ORAL_TABLET | Freq: Once | ORAL | Status: DC | PRN

## 2021-06-23 MED ORDER — MIDAZOLAM HCL 2 MG/2ML IJ SOLN
INTRAMUSCULAR | Status: AC
  Filled 2021-06-23: qty 2

## 2021-06-23 MED ORDER — HEPARIN SODIUM (PORCINE) 5000 UNIT/ML IJ SOLN
5000.0000 [IU] | Freq: Three times a day (TID) | INTRAMUSCULAR | Status: DC
  Administered 2021-06-23 – 2021-07-05 (×36): 5000 [IU] via SUBCUTANEOUS
  Filled 2021-06-23 (×35): qty 1

## 2021-06-23 MED ORDER — CHLORHEXIDINE GLUCONATE 0.12 % MT SOLN: 15.0000 mL | Freq: Once | OROMUCOSAL | Status: AC

## 2021-06-23 MED ORDER — LIDOCAINE 2% (20 MG/ML) 5 ML SYRINGE
INTRAMUSCULAR | Status: DC | PRN
  Administered 2021-06-23: 80 mg via INTRAVENOUS

## 2021-06-23 MED ORDER — ORAL CARE MOUTH RINSE: 15.0000 mL | Freq: Once | OROMUCOSAL | Status: AC

## 2021-06-23 MED ORDER — OXYCODONE HCL 5 MG/5ML PO SOLN: 5.0000 mg | Freq: Once | ORAL | Status: DC | PRN

## 2021-06-23 MED ORDER — ONDANSETRON HCL 4 MG/2ML IJ SOLN
INTRAMUSCULAR | Status: DC | PRN
  Administered 2021-06-23: 4 mg via INTRAVENOUS

## 2021-06-23 MED ORDER — LIDOCAINE-EPINEPHRINE 0.5 %-1:200000 IJ SOLN
INTRAMUSCULAR | Status: AC
  Filled 2021-06-23: qty 1

## 2021-06-23 MED ORDER — LACTATED RINGERS IV SOLN: INTRAVENOUS | Status: DC

## 2021-06-23 MED ORDER — DIAZEPAM 5 MG PO TABS
5.0000 mg | ORAL_TABLET | Freq: Four times a day (QID) | ORAL | Status: DC | PRN
  Administered 2021-06-23 – 2021-07-01 (×6): 5 mg via ORAL
  Filled 2021-06-23 (×7): qty 1

## 2021-06-23 MED ORDER — MIDAZOLAM HCL 2 MG/2ML IJ SOLN
INTRAMUSCULAR | Status: DC | PRN
  Administered 2021-06-23: 1 mg via INTRAVENOUS

## 2021-06-23 MED ORDER — PHENYLEPHRINE 80 MCG/ML (10ML) SYRINGE FOR IV PUSH (FOR BLOOD PRESSURE SUPPORT)
PREFILLED_SYRINGE | INTRAVENOUS | Status: AC
  Filled 2021-06-23: qty 10

## 2021-06-23 MED ORDER — HYDROMORPHONE HCL 1 MG/ML IJ SOLN: 0.2500 mg | INTRAMUSCULAR | Status: DC | PRN

## 2021-06-23 MED ORDER — PROPOFOL 10 MG/ML IV BOLUS
INTRAVENOUS | Status: DC | PRN
  Administered 2021-06-23: 100 mg via INTRAVENOUS

## 2021-06-23 MED ORDER — SUGAMMADEX SODIUM 200 MG/2ML IV SOLN
INTRAVENOUS | Status: DC | PRN
  Administered 2021-06-23 (×2): 200 mg via INTRAVENOUS

## 2021-06-23 MED ORDER — PHENYLEPHRINE 80 MCG/ML (10ML) SYRINGE FOR IV PUSH (FOR BLOOD PRESSURE SUPPORT)
PREFILLED_SYRINGE | INTRAVENOUS | Status: DC | PRN
  Administered 2021-06-23 (×2): 160 ug via INTRAVENOUS
  Administered 2021-06-23: 240 ug via INTRAVENOUS
  Administered 2021-06-23: 160 ug via INTRAVENOUS
  Administered 2021-06-23: 80 ug via INTRAVENOUS

## 2021-06-23 MED ORDER — THROMBIN 5000 UNITS EX SOLR
CUTANEOUS | Status: AC
  Filled 2021-06-23: qty 10000

## 2021-06-23 MED ORDER — THROMBIN 5000 UNITS EX SOLR
CUTANEOUS | Status: DC | PRN
  Administered 2021-06-23 (×2): 5000 [IU] via TOPICAL

## 2021-06-23 MED ORDER — POTASSIUM CHLORIDE IN NACL 20-0.9 MEQ/L-% IV SOLN
INTRAVENOUS | Status: DC
  Filled 2021-06-23: qty 1000

## 2021-06-23 MED ORDER — DEXAMETHASONE SODIUM PHOSPHATE 10 MG/ML IJ SOLN
INTRAMUSCULAR | Status: DC | PRN
Start: 2021-06-23 — End: 2021-06-23
  Administered 2021-06-23: 10 mg via INTRAVENOUS

## 2021-06-23 MED ORDER — VASOPRESSIN 20 UNIT/ML IV SOLN
INTRAVENOUS | Status: DC | PRN
  Administered 2021-06-23 (×6): 1 [IU] via INTRAVENOUS

## 2021-06-23 MED ORDER — ALBUMIN HUMAN 5 % IV SOLN
INTRAVENOUS | Status: DC | PRN
Start: 2021-06-23 — End: 2021-06-23

## 2021-06-23 MED ORDER — SODIUM CHLORIDE 0.9 % IV SOLN
250.0000 mL | INTRAVENOUS | Status: DC
  Administered 2021-06-23: 250 mL via INTRAVENOUS

## 2021-06-23 MED ORDER — SODIUM CHLORIDE (PF) 0.9 % IJ SOLN
INTRAMUSCULAR | Status: AC
  Filled 2021-06-23: qty 20

## 2021-06-23 MED ORDER — FENTANYL CITRATE (PF) 250 MCG/5ML IJ SOLN
INTRAMUSCULAR | Status: AC
  Filled 2021-06-23: qty 5

## 2021-06-23 MED ORDER — EPHEDRINE SULFATE-NACL 50-0.9 MG/10ML-% IV SOSY
PREFILLED_SYRINGE | INTRAVENOUS | Status: DC | PRN
  Administered 2021-06-23: 10 mg via INTRAVENOUS
  Administered 2021-06-23: 5 mg via INTRAVENOUS
  Administered 2021-06-23: 10 mg via INTRAVENOUS

## 2021-06-23 MED ORDER — VANCOMYCIN HCL 1000 MG IV SOLR
INTRAVENOUS | Status: DC | PRN
Start: 2021-06-23 — End: 2021-06-23
  Administered 2021-06-23: 1000 mg

## 2021-06-23 MED ORDER — SODIUM CHLORIDE 0.9% FLUSH
3.0000 mL | Freq: Two times a day (BID) | INTRAVENOUS | Status: DC
  Administered 2021-06-23 – 2021-06-24 (×3): 3 mL via INTRAVENOUS

## 2021-06-23 SURGICAL SUPPLY — 52 items
ADH SKN CLS APL DERMABOND .7 (GAUZE/BANDAGES/DRESSINGS) ×1
ALLOGRAFT 7X14X11 (Bone Implant) ×1 IMPLANT
BAG COUNTER SPONGE SURGICOUNT (BAG) ×4 IMPLANT
BAG SPNG CNTER NS LX DISP (BAG) ×2
BAND INSRT 18 STRL LF DISP RB (MISCELLANEOUS) ×2
BAND RUBBER #18 3X1/16 STRL (MISCELLANEOUS) ×6 IMPLANT
BLADE CLIPPER SURG (BLADE) IMPLANT
BUR DRUM 4.0 (BURR) ×3 IMPLANT
BUR MATCHSTICK NEURO 3.0 LAGG (BURR) ×3 IMPLANT
CANISTER SUCT 3000ML PPV (MISCELLANEOUS) ×3 IMPLANT
CARTRIDGE OIL MAESTRO DRILL (MISCELLANEOUS) ×2 IMPLANT
DECANTER SPIKE VIAL GLASS SM (MISCELLANEOUS) ×2 IMPLANT
DERMABOND ADVANCED (GAUZE/BANDAGES/DRESSINGS) ×1
DERMABOND ADVANCED .7 DNX12 (GAUZE/BANDAGES/DRESSINGS) ×2 IMPLANT
DIFFUSER DRILL AIR PNEUMATIC (MISCELLANEOUS) ×3 IMPLANT
DRAPE HALF SHEET 40X57 (DRAPES) IMPLANT
DRAPE LAPAROTOMY 100X72 PEDS (DRAPES) ×3 IMPLANT
DRAPE MICROSCOPE LEICA (MISCELLANEOUS) ×3 IMPLANT
DURAPREP 6ML APPLICATOR 50/CS (WOUND CARE) ×3 IMPLANT
ELECT COATED BLADE 2.86 ST (ELECTRODE) ×3 IMPLANT
ELECT REM PT RETURN 9FT ADLT (ELECTROSURGICAL) ×2
ELECTRODE REM PT RTRN 9FT ADLT (ELECTROSURGICAL) ×2 IMPLANT
GAUZE 4X4 16PLY ~~LOC~~+RFID DBL (SPONGE) IMPLANT
GLOVE ECLIPSE 6.5 STRL STRAW (GLOVE) ×3 IMPLANT
GLOVE EXAM NITRILE XL STR (GLOVE) IMPLANT
GOWN STRL REUS W/ TWL LRG LVL3 (GOWN DISPOSABLE) ×4 IMPLANT
GOWN STRL REUS W/ TWL XL LVL3 (GOWN DISPOSABLE) IMPLANT
GOWN STRL REUS W/TWL 2XL LVL3 (GOWN DISPOSABLE) IMPLANT
GOWN STRL REUS W/TWL LRG LVL3 (GOWN DISPOSABLE) ×4
GOWN STRL REUS W/TWL XL LVL3 (GOWN DISPOSABLE)
KIT BASIN OR (CUSTOM PROCEDURE TRAY) ×3 IMPLANT
KIT TURNOVER KIT B (KITS) ×3 IMPLANT
NDL HYPO 25X1 1.5 SAFETY (NEEDLE) ×2 IMPLANT
NDL SPNL 22GX3.5 QUINCKE BK (NEEDLE) ×2 IMPLANT
NEEDLE HYPO 25X1 1.5 SAFETY (NEEDLE) ×2 IMPLANT
NEEDLE SPNL 22GX3.5 QUINCKE BK (NEEDLE) ×2 IMPLANT
NS IRRIG 1000ML POUR BTL (IV SOLUTION) ×3 IMPLANT
OIL CARTRIDGE MAESTRO DRILL (MISCELLANEOUS) ×2
PACK LAMINECTOMY NEURO (CUSTOM PROCEDURE TRAY) ×3 IMPLANT
PAD ARMBOARD 7.5X6 YLW CONV (MISCELLANEOUS) ×9 IMPLANT
PIN DISTRACTION 14MM (PIN) IMPLANT
PLATE ACP 1.6 18 (Plate) ×1 IMPLANT
SCREW ACP VA SD 3.5X15 (Screw) ×4 IMPLANT
SCREW ACP VA ST 3.5X15 (Screw) IMPLANT
SPONGE INTESTINAL PEANUT (DISPOSABLE) ×3 IMPLANT
SPONGE SURGIFOAM ABS GEL SZ50 (HEMOSTASIS) ×3 IMPLANT
SUT VIC AB 0 CT1 27 (SUTURE) ×2
SUT VIC AB 0 CT1 27XBRD ANTBC (SUTURE) IMPLANT
SUT VIC AB 3-0 SH 8-18 (SUTURE) ×4 IMPLANT
TOWEL GREEN STERILE (TOWEL DISPOSABLE) ×3 IMPLANT
TOWEL GREEN STERILE FF (TOWEL DISPOSABLE) ×3 IMPLANT
WATER STERILE IRR 1000ML POUR (IV SOLUTION) ×3 IMPLANT

## 2021-06-23 NOTE — Discharge Summary (Signed)
Physician Discharge Summary  ?Emeterio Balke LKG:[id number] DOB: 1965-10-10 DOA: 05/10/2021 ? ?PCP: [name], MD ? ?Admit date: 05/10/2021 ?Discharge date: 06/16/2021 ? ? ? ?Recommendations for Outpatient Follow-up:  ?Left AGAINST MEDICAL ADVICE ? ? ?Discharge Diagnoses:  ?Principal Problem: ?  Lower extremity weakness ?Active Problems: ?  Protein-calorie malnutrition, severe ?  Generalized weakness ?  Pressure injury of buttock, stage 2 ([place]) ?  Hypertension ?  Anemia of chronic disease ?  Hypokalemia ?  GERD (gastroesophageal reflux disease) ?  Hypophosphatemia ?  Hypomagnesemia ?  Vitamin D deficiency ?  Pressure injury of sacral region, unstageable ([place]) ?  Anasarca ?  Acute heart failure with preserved ejection fraction (HFpEF) ([place]) ?  MSSA bacteremia ?  Pressure ulcer of sacral region, stage 3 ([place]) ? ? ? ?Filed Weights  ? 05/13/21 1600 06/09/21 0643 06/14/21 0500  ?Weight: 65 kg 69.9 kg 70 kg  ? ? ?History of present illness:  ?56 y.o. male with past medical history of severe protein calorie malnutrition, hypertension, anasarca, abdominal wall hernia presented to the hospital with multiple falls and swelling. ?-Recently hospitalized for incarcerated abdominal hernia, had primary repair but left AMA before he could be placed in rehab ?-Admitted with severe debility, protein calorie malnutrition and failure to thrive ?-Hospital course complicated by sepsis, MSSA bacteremia ?- ID following.  Will be undergoing MRI of the spine and sacrum to rule out osteomyelitis. ?2D Echo also shows worsening EF. ? ?Hospital Course:  ? ?Sepsis secondary to MSSA bacteremia. Not present on admission. ?-Developed fever, leukocytosis, tachycardia and tachypnea on 5/8. ?-Blood cultures grew MSSA ?-2D echo noted drop in EF to 30%, no clear vegetation ?-ID consulting, needs TEE, unfortunately threatening to leave AMA now ?-MRIs of cervical and lumbar scans pending ?-Now on IV Unasyn, follow-up repeat cultures ?-Left AGAINST  MEDICAL ADVICE 5/10 ? ?Acute systolic CHF, severe MR ?Patient was on aggressive diuresis here in the hospital. ?Echocardiogram 3/23 shows EF of 60-65% with moderate to severe MR due to flail leaflet.,  Repeat echo with EF 30, 35%, moderate MR ?-Not agreeable to additional cardiac work-up at this time, threatening to leave AMA today ?-Given IV Lasix today ? ?  ?Severe protein calorie malnutrition. ?Hypoalbuminemia ?-Suspect chronic liver disease, severe hepatic steatosis noted on imaging, denies EtOH use ?-CT abdomen/pelvis earlier this admission, notable for anasarca,, mild ascites ?-[facility] consulted.  Recommend colonoscopy and EGD once he is ambulatory with improvement in nutritional status. ?Outpatient follow-up with GI recommended. ?  ?Right-sided weakness. ?Deconditioning. ?Presents with multiple falls.  Weakness appears to be more on the right upper extremity. ?Neurology was consulted.  Unable to identify and pinpoint an etiology based on the work-up. Recommended outpatient follow-up and treatment of malnutrition. ?MRIs were done, C-spine with enhancement of the interspinous process/nonspecific , neurosurgery was consulted and they felt that there is no evidence of infection and no further work-up was recommended. ?B1 normal, B12 low normal, vitamin D low, vitamin A low.  All currently being replaced. ?ESR normal.  CRP mildly elevated but improving.  CK normal.  TSH normal. ?PT OT consulted recommended SNF ?-Outpatient EMG recommended for right upper extremity. ?  ?Pressure ulcer stage II, present on admission at left buttock. ?-Continue current wound care ?Wound is tunneling per wound care's note and continues to have purulent greenish discharge. ?  ?Recently repair of incarcerated ventral incisional hernia. ?CT abdomen unremarkable for any acute abnormality this admit.  Monitor for now. ?  ?Neck pain.  Spasticity ?-Supportive care,  DC NSAIDs ?  ?HTN. ?Blood pressure stable. ?Continue current regimen. ?   ?Sinus tachycardia/SVT ?EKG shows sinus tachycardia. ?Intermittently appears to have some SVTs as well. ?-Add p.o. metoprolol ?  ?Iron deficiency anemia likely from nutritional deficiency. ?-So far 3 PRBC transfusion. ?GI was consulted.  No active bleeding seen. ?Treated with IV iron, subcutaneous B12 injection ?Continue oral iron supplements and B complex.,  Patient GI follow-up ?  ?Hypokalemia, hypocalcemia, hypophosphatemia, hypomagnesemia. ?Currently being replaced. ?Monitor. ?  ?Consultants:  ?Infectious disease, neurosurgery, GI, Neuro ? ?Discharge Exam: ?Vitals:  ? 06/16/21 0811 06/16/21 1100  ?BP: (!) 139/107 (!) 132/110  ?Pulse: (!) 124 (!) 125  ?Resp: 14 18  ?Temp: 98.4 ?F (36.9 ?C) (!) 97.4 ?F (36.3 ?C)  ?SpO2: 93% 94%  ? ?General exam: Chronically ill thinly built male sitting up in bed, AAOx3, angry and irritable ?HEENT: No JVD ?CVS: S1-S2, regular rhythm ?Lungs: Decreased breath sounds the bases ?Abdomen: Soft, nontender, nondistended, bowel sounds present ?Extremities: Trace edema ?Skin: Deep sacral decubitus wound with dressing  ?Psychiatry: Irritable ?  ? ?Discharge Instructions ? ? ? ?Allergies as of 06/16/2021   ?No Known Allergies ?  ? ?  ?Medication List  ?  ? ?ASK your doctor about these medications   ? ?Aspirin Low Dose 81 MG chewable tablet ?Generic drug: aspirin ?Chew 1 tablet (81 mg total) by mouth daily. ?Ask about: Should I take this medication? ?  ?CertaVite/Antioxidants Tabs ?Take 1 tablet by mouth daily. ?Ask about: Should I take this medication? ?  ?Vitamin D (Ergocalciferol) 1.25 MG (50000 UNIT) Caps capsule ?Commonly known as: DRISDOL ?Take 1 capsule (50,000 Units total) by mouth every 7 (seven) days. ?Ask about: Should I take this medication? ?  ? ?  ? ?No Known Allergies ? Follow-up Information   ? ? Stechschulte, Nickola Major, MD. Call.   ?Specialty: Surgery ?Contact information: ?[address] ?[place] [address] ?[phone] ? ? ?  ?  ? ? [name], MD. Schedule  an appointment as soon as possible for a visit.   ?Specialty: Family Medicine ?Why: Please call and schedule follow up with Primary care physician listed. ?Contact information: ?[address] ?[place] [address] ?[phone] ? ? ?  ?  ? ? [employer] Follow up.   ?Why: [employer] will provide a charity wheelchair to the patient prior to discharge to home. ?Contact information: ?[address] ?[place] [address] ?[phone] ? ? ?  ?  ? ? [place]             . Go on 06/18/2021.   ?Why: You are scheduled for an appointment with the woundcare clinic on 06/18/2021 at 8 am.  Please follow up regarding your scheduled appointment for your wound on your buttocks area. ?Contact information: ?[address] ?[place] [address] ?[phone] ? ?  ?  ? ?  ?  ? ?  ? ? ? ?The results of significant diagnostics from this hospitalization (including imaging, microbiology, ancillary and laboratory) are listed below for reference.   ? ?Significant Diagnostic Studies: ?CT ABDOMEN PELVIS WO CONTRAST ? ?Result Date: 06/01/2021 ?CLINICAL DATA:  Abdominal pain, acute, nonlocalized EXAM: CT ABDOMEN AND PELVIS WITHOUT CONTRAST TECHNIQUE: Multidetector CT imaging of the abdomen and pelvis was performed following the standard protocol without IV contrast. RADIATION DOSE REDUCTION: This exam was performed according to the departmental dose-optimization program which includes automated exposure control, adjustment  of the mA and/or kV according to patient size and/or use of iterative reconstruction technique. COMPARISON:  CT May 10, 2021. FINDINGS: Lower chest: Small bilateral pleural effusions with adjacent consolidations. Hepatobiliary: Severe hepatic steatosis. Gallbladder is not well visualized. No biliary ductal dilation. Pancreas: No pancreatic ductal dilation. Spleen: No splenomegaly. Adrenals/Urinary Tract: Bilateral adrenal glands are  within normal limits. No hydronephrosis. Urinary bladder is unremarkable for degree of distension. Stomach/Bowel: Radiopaque contrast material traverses the distal colon. Bowel loops are not well evaluated sec

## 2021-06-23 NOTE — Anesthesia Postprocedure Evaluation (Signed)
Anesthesia Post Note ? ?Patient: Fenris Cauble ? ?Procedure(s) Performed: Anterior cervical Decompression and epidural Abcess evacuation with arthodesis cervical Four -five (Spine Cervical) ? ?  ? ?Patient location during evaluation: PACU ?Anesthesia Type: General ?Level of consciousness: awake and alert and oriented ?Pain management: pain level controlled ?Vital Signs Assessment: post-procedure vital signs reviewed and stable ?Respiratory status: spontaneous breathing, nonlabored ventilation and respiratory function stable ?Cardiovascular status: blood pressure returned to baseline and stable ?Postop Assessment: no apparent nausea or vomiting ?Anesthetic complications: no ? ? ?No notable events documented. ? ?Last Vitals:  ?Vitals:  ? 06/23/21 1530 06/23/21 1545  ?BP: (!) 138/101 (!) 168/98  ?Pulse: 87 93  ?Resp: (!) 21 17  ?Temp: 36.7 ?C   ?SpO2: 97% 96%  ?  ?Last Pain:  ?Vitals:  ? 06/23/21 1530  ?TempSrc:   ?PainSc: 0-No pain  ? ? ?  ?  ?  ?  ?  ?  ? ?Dusty Raczkowski A. ? ? ? ? ?

## 2021-06-23 NOTE — Op Note (Signed)
06/23/2021 ? ?3:48 PM ? ?PATIENT:  Logan Nelson  56 y.o. male ? ?PRE-OPERATIVE DIAGNOSIS:  Cervical epidural abscess ? ?POST-OPERATIVE DIAGNOSIS:  Cervical epidural abscess ? ?PROCEDURE:  Anterior Cervical decompression C4/5 ?Arthrodesis C4-5 with 93mm structural allograft ?Anterior instrumentation(Nuvasive) C4-5 ? ?SURGEON:   Surgeon(s): ?Ashok Pall, MD ?Eustace Moore, MD ? ? ?ASSISTANTS:jones, Shanon Brow ? ?ANESTHESIA:   general ? ?EBL:  Total I/O ?In: 1050 [I.V.:700; IV Piggyback:350] ?Out: 1700 [Urine:1650; Blood:50] ? ?BLOOD ADMINISTERED:none ? ?CELL SAVER GIVEN:none ? ?COUNT:per nursing ? ?DRAINS: none  ? ?SPECIMEN:  epidural tissue and prevertebral tissue ? ?DICTATION: Logan Nelson was taken to the operating room, intubated, and placed under general anesthesia without difficulty. He was positioned supine with his head in slight extension on a horseshoe headrest. The neck was prepped and draped in a sterile manner. I infiltrated 5 cc's 1/2%lidocaine/1:200,000 strength epinephrine into the planned incision starting from the midline to the medial border of the left sternocleidomastoid muscle. I opened the incision with a 10 blade and dissected sharply through soft tissue to the platysma. I dissected in the plane superior to the platysma both rostrally and caudally. I then opened the platysma in a horizontal fashion with Metzenbaum scissors, and dissected in the inferior plane rostrally and caudally. With both blunt and sharp technique I created an avascular corridor to the cervical spine. I placed a spinal needle(s) in the disc space at C4/5 . I then reflected the longus colli from C4 to C5 and placed self retaining retractors. I opened the disc space(s) at C4/5 with a 15 blade. I removed disc with curettes, Kerrison punches, and the drill. Using the drill I removed osteophytes and prepared for the decompression.  ?I decompressed the spinal canal and the C5 root(s) with the drill, Kerrison punches, and the  curettes. I used the microscope to aid in microdissection. I removed the posterior longitudinal ligament to fully expose and decompress the thecal sac. We did not encounter frank pus but rather slimy gelatinous material. There was a small amount of liquid material in the epidural space and we did take cultures. I exposed the roots laterally taking down the C4/5  uncovertebral joints. With the decompression complete I moved on to the arthrodesis. ?I used the drill to level the surfaces of C4 and 5. I removed soft tissue to prepare the disc space and the bony surfaces. I measured the space and placed a 21mm structural allograft into the disc space.  ?I then placed the anterior instrumentation. I placed 2 screws in each vertebral body through the plate. I locked the screws into place. Intraoperative xray showed the graft, plate, and screws to be in good position. I irrigated the wound, achieved hemostasis, and closed the wound in layers. I approximated the platysma, and the subcuticular plane with vicryl sutures. I used Dermabond for a sterile dressing.  ? ?PLAN OF CARE: Admit to inpatient  ? ?PATIENT DISPOSITION:  PACU - hemodynamically stable. ?  ?Delay start of Pharmacological VTE agent (>24hrs) due to surgical blood loss or risk of bleeding:  no ? ?  ?

## 2021-06-23 NOTE — Progress Notes (Signed)
Patient ID: Logan Nelson, male   DOB: 1965/10/21, 56 y.o.   MRN: [id number] ?Moving all extremities post op.  ?Will need an mri, but will wait until next week. Postoperative changes will be evident for some time.  ?No collar is needed, nor should be worn.  ?Wound is clean, and dry. No signs of infection.  ?

## 2021-06-23 NOTE — Transfer of Care (Signed)
Immediate Anesthesia Transfer of Care Note ? ?Patient: Brandley Raines ? ?Procedure(s) Performed: Anterior cervical Decompression and epidural Abcess evacuation with arthodesis cervical Four -five (Spine Cervical) ? ?Patient Location: PACU ? ?Anesthesia Type:General ? ?Level of Consciousness: awake and drowsy ? ?Airway & Oxygen Therapy: Patient Spontanous Breathing ? ?Post-op Assessment: Report given to RN and Post -op Vital signs reviewed and stable ? ?Post vital signs: Reviewed and stable ? ?Last Vitals:  ?Vitals Value Taken Time  ?BP 164/85 06/23/21 1534  ?Temp    ?Pulse 88 06/23/21 1535  ?Resp 25 06/23/21 1535  ?SpO2 98 % 06/23/21 1535  ?Vitals shown include unvalidated device data. ? ?Last Pain:  ?Vitals:  ? 06/23/21 1147  ?TempSrc: Oral  ?PainSc:   ?   ? ?Patients Stated Pain Goal: 3 (06/23/21 1142) ? ?Complications: No notable events documented. ?

## 2021-06-23 NOTE — Anesthesia Procedure Notes (Signed)
Procedure Name: Intubation ?Date/Time: 06/23/2021 1:35 PM ?Performed by: Imagene Riches, CRNA ?Pre-anesthesia Checklist: Patient identified, Emergency Drugs available, Suction available and Patient being monitored ?Patient Re-evaluated:Patient Re-evaluated prior to induction ?Oxygen Delivery Method: Circle System Utilized ?Preoxygenation: Pre-oxygenation with 100% oxygen ?Induction Type: IV induction ?Ventilation: Mask ventilation without difficulty ?Laryngoscope Size: Glidescope and 4 ?Grade View: Grade I ?Tube type: Oral ?Tube size: 7.5 mm ?Number of attempts: 1 ?Airway Equipment and Method: Stylet and Oral airway ?Placement Confirmation: ETT inserted through vocal cords under direct vision, positive ETCO2 and breath sounds checked- equal and bilateral ?Secured at: 22 cm ?Tube secured with: Tape ?Dental Injury: Teeth and Oropharynx as per pre-operative assessment  ?Comments: Elective glide scope in effort to preserve dentition. Patient has multiple teeth that are only partially still attached to the root. Teeth remain in place post intubation.  ? ? ? ? ?

## 2021-06-23 NOTE — Progress Notes (Signed)
Patient ID: Logan Nelson, male   DOB: [birth year], 56 y.o.   MRN: [id number] ?BP (!) 141/105   Pulse 73   Temp 97.8 ?F (36.6 ?C) (Oral)   Resp 18   Ht 5' 5.98" (1.676 m)   Wt 67.3 kg   SpO2 96%   BMI 23.96 kg/m?  ?Alert and oriented x 4 ?Speech is clear ?Moving all extremities ?OR today for abscess drainage, cervical spine which is the only location of the abscess. ?Logan Nelson has asked pertinent questions and I have answered them to his sAtisfaction. OR for ACDF to drain abscess ?

## 2021-06-23 NOTE — Progress Notes (Signed)
? ? ? [facility] ?                                                                            ? ? ?Logan Nelson, is a 56 y.o. male, DOB - 1965-11-21, [id number] ?Admit date - 06/16/2021    ?Outpatient Primary MD for the patient is Logan Mai, MD ? ?LOS - 6  days ? ? ? ?Brief summary  ? ?56 year old gentleman with history of hypertension, anasarca, recently admitted for incarcerated abdominal hernia with primary repair  Who left AGAINST MEDICAL ADVICE before he was placed to a rehab.  He subsequently again admitted from 4/4-5/11 with severe debility and malnutrition and failure to thrive.  While he was in the hospital, developed MSSA bacteremia and sepsis, back pain. Was under treatment but left AMA 5/10 just to come back due to profound weakness and now decided he will complete treatment. ? ? ?Assessment & Plan  ? ? ?Assessment and Plan: ? ?MSSA bacteremia ?Probably secondary to sacral decubitus ulcer ?Blood cultures from 06/14/2021 show MSSA and repeat cultures have been negative so far.   ?TTE is negative for age vegetation.  TEE postponed by cardiology. ? ?Patient remains on Unasyn and Ancef, ID on board. ?Neurosurgery on board and plan for cervical decompression. ?Anticipate prolonged IV antibiotics up to 8 weeks. ? ? ? ?Acute systolic congestive heart failure ?Echocardiogram showed left ventricular ejection fraction of 30 to 35%.  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 ? ?Currently euvolemic after diuresis. ?Patient will need ischemic evaluation after infection improved. ? ? ? ?Severe protein calorie malnutrition ?Dietary on board. ? ? ?Hypertension ?Blood pressure parameters are optimal. ? ? ? ?Hypomagnesemia replaced ? ? ? ?Anemia of chronic disease ?Continue to monitor. ? ? ?Severe debility and frailty ?Therapy evaluations recommending SNF ? ? ? ?RN Pressure Injury Documentation: ?Pressure Injury 06/17/21 Sacrum Stage 3 -  Full thickness tissue loss. Subcutaneous fat may be visible but bone,  tendon or muscle are NOT exposed. 2 in vertical by .5 in horizontal--approximately--wound tunneling and pink (Active)  ?06/17/21 1159  ?Location: Sacrum  ?Location Orientation:   ?Staging: Stage 3 -  Full thickness tissue loss. Subcutaneous fat may be visible but bone, tendon or muscle are NOT exposed.  ?Wound Description (Comments): 2 in vertical by .5 in horizontal--approximately--wound tunneling and pink  ?Present on Admission:   ?Dressing Type Foam - Lift dressing to assess site every shift 06/22/21 2032  ?Local wound care.  ? ?Malnutrition Type: ? ?Nutrition Problem: Severe Malnutrition ?Etiology: chronic illness (h/o hernia) ? ? ?Malnutrition Characteristics: ? ?Signs/Symptoms: severe fat depletion, severe muscle depletion ? ? ?Nutrition Interventions: ? ?Interventions: Boost Breeze, MVI, Prostat, Refer to RD note for recommendations ? ?Estimated body mass index is 23.96 kg/m? as calculated from the following: ?  Height as of this encounter: 5' 5.98" (1.676 m). ?  Weight as of this encounter: 67.3 kg. ? ?Code Status: full code.  ?DVT Prophylaxis:  SCDs Start: 06/17/21 1144 ? ? ?Level of Care: Level of care: Med-Surg ?Family Communication: none at bedside.  ? ?Disposition Plan:     Remains inpatient appropriate:  IV antibiotics.  ? ?Procedures:  ?anterior cervical decompression and arthrodesis for abscess at levels  C4/5.by Dr Franky Machoabbell on 5/17 ? ?Consultants:   ?Neuro surgery.  ? ?Antimicrobials:  ? ?Anti-infectives (From admission, onward)  ? ? Start     Dose/Rate Route Frequency Ordered Stop  ? 06/24/21 0200  [MAR Hold]  ceFAZolin (ANCEF) IVPB 2g/100 mL premix        (MAR Hold since Wed 06/23/2021 at 1114.Hold Reason: Transfer to a Procedural area)  ? 2 g ?200 mL/hr over 30 Minutes Intravenous Every 8 hours 06/21/21 1058    ? 06/17/21 1230  [MAR Hold]  Ampicillin-Sulbactam (UNASYN) 3 g in sodium chloride 0.9 % 100 mL IVPB        (MAR Hold since Wed 06/23/2021 at 1114.Hold Reason: Transfer to a Procedural area)  ?  3 g ?200 mL/hr over 30 Minutes Intravenous Every 6 hours 06/17/21 1219 06/23/21 2359  ? 06/17/21 0430  Ampicillin-Sulbactam (UNASYN) 3 g in sodium chloride 0.9 % 100 mL IVPB       ? 3 g ?200 mL/hr over 30 Minutes Intravenous  Once 06/17/21 0407 06/17/21 0619  ? 06/17/21 0415  metroNIDAZOLE (FLAGYL) IVPB 500 mg  Status:  Discontinued       ? 500 mg ?100 mL/hr over 60 Minutes Intravenous Every 12 hours 06/17/21 0407 06/17/21 1226  ? ?  ? ? ? ?Medications ? ?Scheduled Meds: ? [MAR Hold] docusate sodium  100 mg Oral BID  ? [MAR Hold] furosemide  20 mg Oral Daily  ? [MAR Hold] lidocaine  1 patch Transdermal Q24H  ? [MAR Hold] metoprolol tartrate  25 mg Oral BID  ? [MAR Hold] sodium chloride flush  3 mL Intravenous Q12H  ? ?Continuous Infusions: ? [MAR Hold] ampicillin-sulbactam (UNASYN) IV 3 g (06/23/21 0749)  ? [MAR Hold]  ceFAZolin (ANCEF) IV    ? lactated ringers 10 mL/hr at 06/23/21 1233  ? ?PRN Meds:.0.9 % irrigation (POUR BTL), [MAR Hold] acetaminophen **OR** [MAR Hold] acetaminophen, [MAR Hold] bisacodyl, hemostatic agents (no charge) Optime, [MAR Hold] hydrALAZINE, [MAR Hold] nicotine, [MAR Hold] oxyCODONE, [MAR Hold] polyethylene glycol, thrombin ? ? ? ?Subjective:  ? ?Styles Laural BenesJohnson was seen and examined today.  Pain controlled. No chest pain or sob. Is looking forward for the procedure this afternoon.  ? ?Objective:  ? ?Vitals:  ? 06/22/21 2015 06/23/21 0513 06/23/21 0742 06/23/21 1147  ?BP: 128/90 (!) 139/107 (!) 138/99 (!) 141/105  ?Pulse: 92 86 61 73  ?Resp: 18 18 17 18   ?Temp: 98.4 ?F (36.9 ?C) 98.2 ?F (36.8 ?C) 98.4 ?F (36.9 ?C) 97.8 ?F (36.6 ?C)  ?TempSrc: Oral Oral Oral Oral  ?SpO2: 98% 97% 100% 96%  ?Weight:      ?Height:      ? ? ?Intake/Output Summary (Last 24 hours) at 06/23/2021 1307 ?Last data filed at 06/23/2021 1006 ?Gross per 24 hour  ?Intake --  ?Output 2850 ml  ?Net -2850 ml  ? ?Filed Weights  ? 06/18/21 0600  ?Weight: 67.3 kg  ? ? ? ?Exam ?General exam: disheveled appearing gentleman, not in  distress.  ?Respiratory system: Clear to auscultation. Respiratory effort normal. ?Cardiovascular system: S1 & S2 heard, RRR. No JVD, No pedal edema. ?Gastrointestinal system: Abdomen is nondistended, soft and nontender. Normal bowel sounds heard. ?Central nervous system: Alert and oriented. No focal neurological deficits. ?Extremities: Symmetric 5 x 5 power. ?Skin: No rashes, lesions or ulcers ?Psychiatry: Mood & affect appropriate.  ? ? ?Data Reviewed:  I have personally reviewed following labs and imaging studies ? ? ?CBC ?Lab Results  ?Component Value Date  ?  WBC 13.3 (H) 06/23/2021  ? RBC 3.38 (L) 06/23/2021  ? HGB 10.0 (L) 06/23/2021  ? HCT 31.3 (L) 06/23/2021  ? MCV 92.6 06/23/2021  ? MCH 29.6 06/23/2021  ? PLT 458 (H) 06/23/2021  ? MCHC 31.9 06/23/2021  ? RDW 17.9 (H) 06/23/2021  ? LYMPHSABS 2.5 06/23/2021  ? MONOABS 1.4 (H) 06/23/2021  ? EOSABS 0.3 06/23/2021  ? BASOSABS 0.0 06/23/2021  ? ? ? ?Last metabolic panel ?Lab Results  ?Component Value Date  ? NA 136 06/23/2021  ? K 4.0 06/23/2021  ? CL 104 06/23/2021  ? CO2 25 06/23/2021  ? BUN 12 06/23/2021  ? CREATININE 0.51 (L) 06/23/2021  ? GLUCOSE 78 06/23/2021  ? GFRNONAA >60 06/23/2021  ? GFRAA >60 01/23/2019  ? CALCIUM 8.3 (L) 06/23/2021  ? PHOS 3.3 06/23/2021  ? PROT 5.6 (L) 06/23/2021  ? ALBUMIN 2.3 (L) 06/23/2021  ? LABGLOB 2.6 04/13/2021  ? AGRATIO 1.0 04/13/2021  ? BILITOT 0.8 06/23/2021  ? ALKPHOS 72 06/23/2021  ? AST 20 06/23/2021  ? ALT 13 06/23/2021  ? ANIONGAP 7 06/23/2021  ? ? ?CBG (last 3)  ?No results for input(s): GLUCAP in the last 72 hours.  ? ? ?Coagulation Profile: ?No results for input(s): INR, PROTIME in the last 168 hours. ? ? ?Radiology Studies: ?No results found. ? ? ? ? ?Hosie Poisson M.D. ?[facility] ?06/23/2021, 1:07 PM ? ?Available via Epic secure chat 7am-7pm ?After 7 pm, please refer to night coverage provider listed on amion. ? ? ? ?

## 2021-06-24 ENCOUNTER — Inpatient Hospital Stay: Payer: Self-pay

## 2021-06-24 DIAGNOSIS — D638 Anemia in other chronic diseases classified elsewhere: Secondary | ICD-10-CM | POA: Diagnosis not present

## 2021-06-24 DIAGNOSIS — R7881 Bacteremia: Secondary | ICD-10-CM | POA: Diagnosis not present

## 2021-06-24 DIAGNOSIS — R531 Weakness: Secondary | ICD-10-CM | POA: Diagnosis not present

## 2021-06-24 DIAGNOSIS — I5021 Acute systolic (congestive) heart failure: Secondary | ICD-10-CM | POA: Diagnosis not present

## 2021-06-24 DIAGNOSIS — B9561 Methicillin susceptible Staphylococcus aureus infection as the cause of diseases classified elsewhere: Secondary | ICD-10-CM | POA: Diagnosis not present

## 2021-06-24 MED ORDER — BOOST / RESOURCE BREEZE PO LIQD CUSTOM
1.0000 | Freq: Three times a day (TID) | ORAL | Status: DC
  Administered 2021-06-24 – 2021-07-05 (×31): 1 via ORAL

## 2021-06-24 MED ORDER — MAGNESIUM SULFATE 2 GM/50ML IV SOLN
2.0000 g | Freq: Once | INTRAVENOUS | Status: AC
  Administered 2021-06-24: 2 g via INTRAVENOUS
  Filled 2021-06-24: qty 50

## 2021-06-24 MED ORDER — SODIUM CHLORIDE 0.9% FLUSH
10.0000 mL | Freq: Two times a day (BID) | INTRAVENOUS | Status: DC
  Administered 2021-06-25: 10 mL

## 2021-06-24 MED ORDER — PROSOURCE TF PO LIQD
45.0000 mL | Freq: Three times a day (TID) | ORAL | Status: DC
  Administered 2021-06-24 – 2021-06-25 (×4): 45 mL
  Filled 2021-06-24 (×5): qty 45

## 2021-06-24 MED ORDER — SODIUM CHLORIDE 0.9% FLUSH: 10.0000 mL | INTRAVENOUS | Status: DC | PRN

## 2021-06-24 MED ORDER — ADULT MULTIVITAMIN W/MINERALS CH
1.0000 | ORAL_TABLET | Freq: Every day | ORAL | Status: DC
  Administered 2021-06-24 – 2021-07-05 (×12): 1 via ORAL
  Filled 2021-06-24 (×12): qty 1

## 2021-06-24 NOTE — Progress Notes (Signed)
Patient ID: Logan Nelson, male   DOB: 1965-07-13, 56 y.o.   MRN: [id number] BP 127/78 (BP Location: Right Arm)   Pulse (!) 110   Temp 98.7 F (37.1 C) (Oral)   Resp 18   Ht 5' 5.98" (1.676 m)   Wt 67.3 kg   SpO2 98%   BMI 23.96 kg/m  Wound is clean, and dry OR cultures growing staph aureus Moving all extremities Will need full course of abx

## 2021-06-24 NOTE — Progress Notes (Signed)
Mobility Specialist Progress Note:   06/24/21 1442  Mobility  Activity Ambulated with assistance in hallway  Level of Assistance Contact guard assist, steadying assist  Assistive Device Front wheel walker  Distance Ambulated (ft) 570 ft  Activity Response Tolerated well  $Mobility charge 1 Mobility   Pt received in bed willing to participate in mobility. No complaints of pain. Left in bed with call bell in reach and all needs met.   [facility] Public librarian Phone [phone]

## 2021-06-24 NOTE — Progress Notes (Signed)
   We were asked to help arrange TEE to rule out endocarditis given bacteremia. Reviewed chart. Patient just underwent surgery of his cervical spine yesterday for a cervical epidural abscess. S/p anterior cervical decompression of C4-5 with arthrodesis with structural allograft and anterior instrumentation. Reviewed with Dr. Jens Som. Patient is not a candidate for TEE right. He needs to recover from his cervical spine surgery. If patient is going to be here for a while, please notify us prior to discharge and we can reassess. I will update primary team.  Corrin Parker, PA-C 06/24/2021 4:36 PM

## 2021-06-24 NOTE — Progress Notes (Signed)
Pt has an order for PICC placement. Attempted to get consent but pt refused to have the procedure. Unit RN notified.

## 2021-06-24 NOTE — Progress Notes (Addendum)
I have seen and examined the patient. I have personally reviewed the clinical findings, laboratory findings, microbiological data and imaging studies. The assessment and treatment plan was discussed with the Nurse Practitioner Janene Madeira. I agree with her/his recommendations except following additions/corrections.  S/p OR 5/17 with anterior cervical decompression c4/5, arthrodesis with structural allograft and anterior instrumentation. OR cx growing Staph aureus. No frank pus but slimy gelatinous material noted in OR  Afebrile, blood cultures have cleared Per Cardiology, high risk candidate for TEE and no current plans. UDS recently positive for cocaine and will need supervised setting for prolonged IV abtx + physical limitations  Unasyn switched to IV cefazolin on 5/17 after 7 days course for infected Sacral ulcer, continue IV cefazolin Will need 6 to 8 weeks of IV abtx from 06/23/21 Following cultures peripherally this week Monitor CBC, BMP  Rosiland Oz, MD Infectious Disease Physician [facility] [address] [place], [place] [address] Phone: [phone]  Fax: [place] for Infectious Disease  Date of Admission:  06/16/2021      Total days of antibiotics   Unasyn 5/10 >> current           ASSESSMENT: Logan Nelson is a 56 y.o. male admitted with nosocomial onset MSSA bacteremia complicated by [id number] epidural abscess with discitis/OM now s/p anterior cervical decompression and abscess evacuation with placement of screws. Intraoperative cultures are pending with cultures growing something yet to be ID'd. Unasyn for treatment now with transition to cefazolin to complete 6-8 weeks of treatment. He already has a long indication for treatment with his spine infection so TEE would contribute much else from an ID standpoint, but given the decreased EF 30-35%, severely dilated LA and what seems to  be discordant MR findings between previous TTEs, cardiology may want TEE to further clarify treatment plan.   Infected Sacral Decubitus Ulcer - continue Unasyn for broader coverage for 10-d total (5/10 start).   Disposition - OT recommending SNF/short term rehab. Would be best to get ongoing IV antibiotics in SNF as well as I worry about him being able to handle this at home given physical limitations for self care and basic needs/mobility.   OK to get PICC line from ID standpoint as he has cleared his blood cultures.    PLAN: Continue Unasyn through 5/20 then switch back to cefazolin 5/11 to continue 6 weeks Agree with SNF rec from rehab ?TEE if cardiology wants one to eval discordance between MR findings between TTE's and reduced EF.  OK from ID perspective to place PICC line     Principal Problem:   MSSA bacteremia Active Problems:   Hypertension   Protein-calorie malnutrition, severe   Anemia of chronic disease   Generalized weakness   Pressure injury of buttock, stage 2 (HCC)   Acute systolic (congestive) heart failure (HCC)   Prolonged QT interval   Epidural abscess   Abscess in epidural space of cervical spine    docusate sodium  100 mg Oral BID   furosemide  20 mg Oral Daily   heparin injection (subcutaneous)  5,000 Units Subcutaneous Q8H   lidocaine  1 patch Transdermal Q24H   metoprolol tartrate  25 mg Oral BID   sodium chloride flush  3 mL Intravenous Q12H   sodium chloride flush  3 mL Intravenous Q12H    SUBJECTIVE: [name] feels significant improvement in pain in his neck today compared to yesterday.  He has a good appetite and mentions that he should do what he is supposed to so he can get better.  No s/e from ABX No fevers/chills   Review of Systems: Review of Systems  Constitutional:  Negative for chills.  HENT:  Negative for sore throat.   Respiratory:  Negative for cough and sputum production.   Cardiovascular:  Negative for chest pain.   Gastrointestinal:  Negative for abdominal pain, diarrhea and vomiting.  Genitourinary:  Negative for dysuria and flank pain.  Musculoskeletal:  Positive for neck pain (improved). Negative for joint pain and myalgias.  Skin:  Negative for rash.  Neurological:  Negative for dizziness, tingling and headaches.  Psychiatric/Behavioral:  Negative for depression and substance abuse. The patient is not nervous/anxious and does not have insomnia.      No Known Allergies  OBJECTIVE: Vitals:   06/23/21 2021 06/24/21 0010 06/24/21 0445 06/24/21 0754  BP: 119/90 (!) 120/97 107/80 108/81  Pulse: (!) 101 100 85 72  Resp: 20 19 19 18   Temp: 98.6 F (37 C) 98.3 F (36.8 C) 97.6 F (36.4 C) 97.9 F (36.6 C)  TempSrc: Oral Oral Oral Oral  SpO2: 100% 100% 100% 97%  Weight:      Height:       Body mass index is 23.96 kg/m.  Physical Exam Vitals reviewed.  Constitutional:      General: He is not in acute distress.    Appearance: He is ill-appearing. He is not toxic-appearing.  Neck:     Comments: Can move neck side to side freely. Some pain/soreness with lateral rotation but overall much improved.  Cardiovascular:     Rate and Rhythm: Normal rate.  Pulmonary:     Effort: Pulmonary effort is normal.  Abdominal:     General: Abdomen is flat. Bowel sounds are normal. There is no distension.  Musculoskeletal:     Comments: UE weakness improved   Skin:    General: Skin is warm and dry.  Neurological:     Mental Status: He is alert. He is disoriented.    Lab Results Lab Results  Component Value Date   WBC 13.3 (H) 06/23/2021   HGB 10.0 (L) 06/23/2021   HCT 31.3 (L) 06/23/2021   MCV 92.6 06/23/2021   PLT 458 (H) 06/23/2021    Lab Results  Component Value Date   CREATININE 0.51 (L) 06/23/2021   BUN 12 06/23/2021   NA 136 06/23/2021   K 4.0 06/23/2021   CL 104 06/23/2021   CO2 25 06/23/2021    Lab Results  Component Value Date   ALT 13 06/23/2021   AST 20 06/23/2021    ALKPHOS 72 06/23/2021   BILITOT 0.8 06/23/2021     Microbiology: Recent Results (from the past 240 hour(s))  Culture, blood (Routine X 2) w Reflex to ID Panel     Status: Abnormal   Collection Time: 06/14/21  4:45 PM   Specimen: BLOOD  Result Value Ref Range Status   Specimen Description BLOOD LEFT ANTECUBITAL  Final   Special Requests   Final    BOTTLES DRAWN AEROBIC AND ANAEROBIC Blood Culture results may not be optimal due to an inadequate volume of blood received in culture bottles   Culture  Setup Time   Final    GRAM POSITIVE COCCI IN CLUSTERS IN BOTH AEROBIC AND ANAEROBIC BOTTLES CRITICAL VALUE NOTED.  VALUE IS CONSISTENT WITH PREVIOUSLY REPORTED AND CALLED VALUE.    Culture (A)  Final  STAPHYLOCOCCUS AUREUS SUSCEPTIBILITIES PERFORMED ON PREVIOUS CULTURE WITHIN THE LAST 5 DAYS. Performed at [facility], [place] [address] [place], [place] [address]    Report Status 06/17/2021 FINAL  Final  Culture, blood (Routine X 2) w Reflex to ID Panel     Status: Abnormal   Collection Time: 06/14/21  4:55 PM   Specimen: BLOOD  Result Value Ref Range Status   Specimen Description BLOOD RIGHT ANTECUBITAL  Final   Special Requests   Final    BOTTLES DRAWN AEROBIC AND ANAEROBIC Blood Culture adequate volume   Culture  Setup Time   Final    GRAM POSITIVE COCCI IN CLUSTERS IN BOTH AEROBIC AND ANAEROBIC BOTTLES CRITICAL RESULT CALLED TO, READ BACK BY AND VERIFIED WITH: PHARM D P.VEGA ON [id number] AT 0825 BY E.PARRISH Performed at [facility], [place] [address], [place], [place] [address]    Culture STAPHYLOCOCCUS AUREUS (A)  Final   Report Status 06/17/2021 FINAL  Final   Organism ID, Bacteria STAPHYLOCOCCUS AUREUS  Final      Susceptibility   Staphylococcus aureus - MIC*    CIPROFLOXACIN <=0.5 SENSITIVE Sensitive     ERYTHROMYCIN >=8 RESISTANT Resistant     GENTAMICIN <=0.5 SENSITIVE Sensitive     OXACILLIN 0.5 SENSITIVE Sensitive     TETRACYCLINE <=1 SENSITIVE Sensitive      VANCOMYCIN 1 SENSITIVE Sensitive     TRIMETH/SULFA <=10 SENSITIVE Sensitive     CLINDAMYCIN RESISTANT Resistant     RIFAMPIN <=0.5 SENSITIVE Sensitive     Inducible Clindamycin POSITIVE Resistant     * STAPHYLOCOCCUS AUREUS  Blood Culture ID Panel (Reflexed)     Status: Abnormal   Collection Time: 06/14/21  4:55 PM  Result Value Ref Range Status   Enterococcus faecalis NOT DETECTED NOT DETECTED Final   Enterococcus Faecium NOT DETECTED NOT DETECTED Final   Listeria monocytogenes NOT DETECTED NOT DETECTED Final   Staphylococcus species DETECTED (A) NOT DETECTED Final    Comment: CRITICAL RESULT CALLED TO, READ BACK BY AND VERIFIED WITH: PHARM D P.VEGA ON PN:[id number] AT 0825 BY E.PARRISH    Staphylococcus aureus (BCID) DETECTED (A) NOT DETECTED Final    Comment: CRITICAL RESULT CALLED TO, READ BACK BY AND VERIFIED WITH: PHARM D P.VEGA ON [id number] AT 0825 BY E.PARRISH    Staphylococcus epidermidis NOT DETECTED NOT DETECTED Final   Staphylococcus lugdunensis NOT DETECTED NOT DETECTED Final   Streptococcus species NOT DETECTED NOT DETECTED Final   Streptococcus agalactiae NOT DETECTED NOT DETECTED Final   Streptococcus pneumoniae NOT DETECTED NOT DETECTED Final   Streptococcus pyogenes NOT DETECTED NOT DETECTED Final   A.calcoaceticus-baumannii NOT DETECTED NOT DETECTED Final   Bacteroides fragilis NOT DETECTED NOT DETECTED Final   Enterobacterales NOT DETECTED NOT DETECTED Final   Enterobacter cloacae complex NOT DETECTED NOT DETECTED Final   Escherichia coli NOT DETECTED NOT DETECTED Final   Klebsiella aerogenes NOT DETECTED NOT DETECTED Final   Klebsiella oxytoca NOT DETECTED NOT DETECTED Final   Klebsiella pneumoniae NOT DETECTED NOT DETECTED Final   Proteus species NOT DETECTED NOT DETECTED Final   Salmonella species NOT DETECTED NOT DETECTED Final   Serratia marcescens NOT DETECTED NOT DETECTED Final   Haemophilus influenzae NOT DETECTED NOT DETECTED Final   Neisseria  meningitidis NOT DETECTED NOT DETECTED Final   Pseudomonas aeruginosa NOT DETECTED NOT DETECTED Final   Stenotrophomonas maltophilia NOT DETECTED NOT DETECTED Final   Candida albicans NOT DETECTED NOT DETECTED Final   Candida auris NOT DETECTED  NOT DETECTED Final   Candida glabrata NOT DETECTED NOT DETECTED Final   Candida krusei NOT DETECTED NOT DETECTED Final   Candida parapsilosis NOT DETECTED NOT DETECTED Final   Candida tropicalis NOT DETECTED NOT DETECTED Final   Cryptococcus neoformans/gattii NOT DETECTED NOT DETECTED Final   Meth resistant mecA/C and MREJ NOT DETECTED NOT DETECTED Final    Comment: Performed at [facility] [place] [address], [place], [place] [address]  Culture, blood (Routine X 2) w Reflex to ID Panel     Status: None   Collection Time: 06/16/21  4:27 AM   Specimen: BLOOD RIGHT ARM  Result Value Ref Range Status   Specimen Description BLOOD RIGHT ARM  Final   Special Requests   Final    BOTTLES DRAWN AEROBIC ONLY Blood Culture results may not be optimal due to an excessive volume of blood received in culture bottles   Culture   Final    NO GROWTH 5 DAYS Performed at [facility], [place] [address], [place], [place] [address]    Report Status 06/21/2021 FINAL  Final  Culture, blood (Routine X 2) w Reflex to ID Panel     Status: None   Collection Time: 06/16/21  4:27 AM   Specimen: BLOOD RIGHT HAND  Result Value Ref Range Status   Specimen Description BLOOD RIGHT HAND  Final   Special Requests   Final    BOTTLES DRAWN AEROBIC ONLY Blood Culture results may not be optimal due to an excessive volume of blood received in culture bottles   Culture   Final    NO GROWTH 5 DAYS Performed at [facility], [place] [address] [place], [place] [address]    Report Status 06/21/2021 FINAL  Final  Urine Culture     Status: None   Collection Time: 06/16/21  9:31 PM   Specimen: Urine, Clean Catch  Result Value Ref Range Status   Specimen Description URINE,  CLEAN CATCH  Final   Special Requests NONE  Final   Culture   Final    NO GROWTH Performed at [facility], [place] [address] [place], [place] [address]    Report Status 06/18/2021 FINAL  Final  Culture, blood (Routine X 2) w Reflex to ID Panel     Status: None   Collection Time: 06/18/21 11:20 PM   Specimen: BLOOD  Result Value Ref Range Status   Specimen Description BLOOD LEFT ANTECUBITAL  Final   Special Requests   Final    BOTTLES DRAWN AEROBIC AND ANAEROBIC Blood Culture results may not be optimal due to an inadequate volume of blood received in culture bottles   Culture   Final    NO GROWTH 5 DAYS Performed at [facility], [place] [address] [place], [place] [address]    Report Status 06/23/2021 FINAL  Final  Culture, blood (Routine X 2) w Reflex to ID Panel     Status: None   Collection Time: 06/18/21 11:30 PM   Specimen: BLOOD LEFT HAND  Result Value Ref Range Status   Specimen Description BLOOD LEFT HAND  Final   Special Requests   Final    BOTTLES DRAWN AEROBIC ONLY Blood Culture adequate volume   Culture   Final    NO GROWTH 5 DAYS Performed at [facility], [place] [address] [place], [place] [address]    Report Status 06/23/2021 FINAL  Final  Surgical pcr screen     Status:  None   Collection Time: 06/23/21 10:50 AM   Specimen: Nasal Mucosa; Nasal Swab  Result Value Ref Range Status   MRSA, PCR NEGATIVE NEGATIVE Final   Staphylococcus aureus NEGATIVE NEGATIVE Final    Comment: (NOTE) The Xpert SA Assay (FDA approved for NASAL specimens in patients 42 years of age and older), is one component of a comprehensive surveillance program. It is not intended to diagnose infection nor to guide or monitor treatment. Performed at [facility], [place] [address], [place], [place] [address]   Aerobic/Anaerobic Culture w Gram Stain (surgical/deep wound)     Status: None (Preliminary result)   Collection Time: 06/23/21  1:49 PM   Specimen: Soft Tissue, Other   Result Value Ref Range Status   Specimen Description TISSUE  Final   Special Requests ANTERIOR CERVICAL AND EPIDURAL ABSCESS  Final   Gram Stain NO WBC SEEN NO ORGANISMS SEEN   Final   Culture   Final    CULTURE REINCUBATED FOR BETTER GROWTH Performed at [facility], [place] [address], [place], [place] [address]    Report Status PENDING  Incomplete  Aerobic/Anaerobic Culture w Gram Stain (surgical/deep wound)     Status: None (Preliminary result)   Collection Time: 06/23/21  2:32 PM   Specimen: PATH Other; Tissue  Result Value Ref Range Status   Specimen Description TISSUE  Final   Special Requests NONE  Final   Gram Stain   Final    RARE WBC PRESENT, PREDOMINANTLY MONONUCLEAR RARE GRAM POSITIVE COCCI    Culture   Final    CULTURE REINCUBATED FOR BETTER GROWTH Performed at [facility], [place] [address], [place], [place] [address]    Report Status PENDING  Incomplete    Janene Madeira, MSN, NP-C [facility] for Infectious Disease [email or web address] Pager: [phone] Office: [phone] [facility] Main Line: [place] Communication Welcome

## 2021-06-24 NOTE — Progress Notes (Signed)
Physical Therapy Treatment Patient Details Name: Logan Nelson MRN: [id number] DOB: [birth year] Today's Date: 06/24/2021   History of Present Illness Pt is 56 yr old M admitted on 06/16/21 with c/o weakness and back pain.  Recent admissions for hernia repair and B LE weakness/falls where pt has left AMA both times.  Imaging (+) for C4-5 abcess and OM of coccyx. On 5/17, pt underwent cervical abscess drain and ACDF of C4-5. PMH: anemia, arthritis, bradycardia, falls, HTN    PT Comments    Pt in bed and has been seen for gait today, after having a procedure yesterday for abscess on his neck.  Pt is having pain and weakness generally but did not have collar on as it is discontinued.  Follow acutely for mobility as tolerated and to encourage him to walk and move.  Follow up with his orthopedic docs, neurosurgery MD's and his cardiologists to see when pt can be moved to rehab setting.   Recommendations for follow up therapy are one component of a multi-disciplinary discharge planning process, led by the attending physician.  Recommendations may be updated based on patient status, additional functional criteria and insurance authorization.  Follow Up Recommendations  Skilled nursing-short term rehab (<3 hours/day)     Assistance Recommended at Discharge Frequent or constant Supervision/Assistance  Patient can return home with the following A lot of help with walking and/or transfers;A lot of help with bathing/dressing/bathroom;Assistance with cooking/housework;Assistance with feeding;Direct supervision/assist for medications management;Direct supervision/assist for financial management;Assist for transportation;Help with stairs or ramp for entrance   Equipment Recommendations  Rolling walker (2 wheels);Wheelchair (measurements PT);Wheelchair cushion (measurements PT);Other (comment)    Recommendations for Other Services       Precautions / Restrictions Precautions Precautions:  Fall;Cervical Precaution Booklet Issued: Yes (comment) Precaution Comments: no brace needed per orders Restrictions Weight Bearing Restrictions: No     Mobility  Bed Mobility               General bed mobility comments: declines    Transfers                        Ambulation/Gait                   Stairs             Wheelchair Mobility    Modified Rankin (Stroke Patients Only)       Balance                                            Cognition Arousal/Alertness: Awake/alert Behavior During Therapy: WFL for tasks assessed/performed Overall Cognitive Status: No family/caregiver present to determine baseline cognitive functioning                     Current Attention Level: Selective Memory: Decreased short-term memory, Decreased recall of precautions Following Commands: Follows one step commands with increased time Safety/Judgement: Decreased awareness of safety Awareness: Emergent Problem Solving: Requires verbal cues, Requires tactile cues General Comments: pt declining to move despite opportunity        Exercises General Exercises - Lower Extremity Ankle Circles/Pumps: AROM, 5 reps Quad Sets: AROM, 10 reps Gluteal Sets: AROM, 10 reps Heel Slides: AROM, 10 reps Hip ABduction/ADduction: AROM, 10 reps Straight Leg Raises: AROM, 10 reps    General Comments  Pertinent Vitals/Pain Pain Assessment Pain Assessment: No/denies pain    Home Living                          Prior Function            PT Goals (current goals can now be found in the care plan section)      Frequency    Min 2X/week      PT Plan Current plan remains appropriate    Co-evaluation              AM-PAC PT "6 Clicks" Mobility   Outcome Measure  Help needed turning from your back to your side while in a flat bed without using bedrails?: A Lot Help needed moving from lying on your back to  sitting on the side of a flat bed without using bedrails?: A Lot Help needed moving to and from a bed to a chair (including a wheelchair)?: A Lot Help needed standing up from a chair using your arms (e.g., wheelchair or bedside chair)?: A Lot Help needed to walk in hospital room?: A Lot Help needed climbing 3-5 steps with a railing? : A Lot 6 Click Score: 12    End of Session Equipment Utilized During Treatment: Gait belt Activity Tolerance: Patient limited by fatigue Patient left: in bed;with call bell/phone within reach;with bed alarm set Nurse Communication: Mobility status PT Visit Diagnosis: Other abnormalities of gait and mobility (R26.89);History of falling (Z91.81) Pain - Right/Left: Left Pain - part of body: Shoulder     Time: [id number] PT Time Calculation (min) (ACUTE ONLY): 17 min  Charges:  $Therapeutic Exercise: 8-22 mins Ramond Dial 06/24/2021, 5:32 PM  Mee Hives, PT PhD Acute Rehab Dept. Number: [place] and [place]

## 2021-06-24 NOTE — Progress Notes (Signed)
Nutrition Follow-up  DOCUMENTATION CODES:  Severe malnutrition in context of chronic illness  INTERVENTION:  - Continue current diet as ordered - Boost Breeze po TID, each supplement provides 250 kcal and 9 grams of protein - 30 ml ProSource Plus TID, each supplement provides 100 kcals and 15 grams protein.  - MVI with minerals daily - Double protein with meals  NUTRITION DIAGNOSIS:  Severe Malnutrition related to chronic illness (h/o hernia) as evidenced by severe fat depletion, severe muscle depletion. - remains applicable  GOAL:  Patient will meet greater than or equal to 90% of their needs - progressing, diet and supplements in place  MONITOR:  PO intake, Supplement acceptance, Diet advancement, Labs, Weight trends  REASON FOR ASSESSMENT:  Malnutrition Screening Tool    ASSESSMENT:  Pt with recent hospitalization for incarcerated abdominal hernia with primary repair but left AMA prior to rehab placement. He was subsequently admitted with severe debility, malnutrition, and FTT from 4/3-5/10.  During the hospitalization, he developed MSSA bacteremia and sepsis and ID was following with plan for MRI of the spine and sacrum to r/o osteo.  He was also noted to have worsening EF.  He was upset about being NPO for multiple MRI scans with sedation and left AMA.  He returned within a few hours due to weakness and back pain. He reports that he walked out to the bus stop and took a misstep and fell.  Pt continues to have good intake and nutrition status is stable with current plan. Since last assessment, imaging obtained which showed an epidural abscess. Taken to OR 5/17 for abscess drainage by neurosurgery.   Average Meal Intake: 5/11-5/18: 80% intake x 6 recorded meals  Nutritionally Relevant Medications: Scheduled Meds:  docusate sodium  100 mg Oral BID   furosemide  20 mg Oral Daily   Continuous Infusions:  sodium chloride 250 mL (06/23/21 2000)    ceFAZolin (ANCEF) IV 2 g  (06/24/21 1731)   PRN Meds: bisacodyl, polyethylene glycol  Labs Reviewed  NUTRITION - FOCUSED PHYSICAL EXAM: Flowsheet Row Most Recent Value  Orbital Region Severe depletion  Upper Arm Region Severe depletion  Thoracic and Lumbar Region Moderate depletion  Buccal Region Severe depletion  Temple Region Severe depletion  Clavicle Bone Region Severe depletion  Clavicle and Acromion Bone Region Severe depletion  Scapular Bone Region Severe depletion  Dorsal Hand Moderate depletion  Patellar Region Severe depletion  Anterior Thigh Region Severe depletion  Posterior Calf Region Severe depletion  Edema (RD Assessment) Moderate  [moderate BUE, mild BLE]  Hair Reviewed  Eyes Reviewed  Mouth Reviewed  Skin Reviewed  Nails Reviewed    Diet Order:   Diet Order             Diet regular Room service appropriate? Yes; Fluid consistency: Thin  Diet effective now                  EDUCATION NEEDS:  Education needs have been addressed  Skin:  Skin Integrity Issues:: Stage III Stage III: per WOC: sacral wound (2.5 cm x 1.9 cm x 2 cm) has greatly improved from previous assessment on 06/07/21  Last BM:  5/18  Height:  Ht Readings from Last 1 Encounters:  06/18/21 5' 5.98" (1.676 m)    Weight:  Wt Readings from Last 1 Encounters:  06/18/21 67.3 kg    Ideal Body Weight:  64.5 kg  BMI:  Body mass index is 23.96 kg/m.  Estimated Nutritional Needs:  Kcal:  2100-2300  Protein:  100-115g Fluid:  >/=2L   Ranell Patrick, RD, LDN Clinical Dietitian RD pager # available in AMION  After hours/weekend pager # available in [facility]

## 2021-06-24 NOTE — Progress Notes (Signed)
Occupational Therapy Treatment Patient Details Name: Logan RaringDarryl Keith Suber MRN: [id number] DOB: 06/11/1965 Today's Date: 06/24/2021   History of present illness Pt is 56 yr old M admitted on 06/16/21 with c/o weakness and back pain.  Recent admissions for hernia repair and B LE weakness/falls where pt has left AMA both times.  Imaging (+) for C4-5 abcess and OM of coccyx. On 5/17, pt underwent cervical abscess drain and ACDF of C4-5. PMH: anemia, arthritis, bradycardia, falls, HTN   OT comments  Pt seen for first OT session since cervical sx with reports of decreased pain levels. Pt continues to be limited by deficits in balance, coordination and overall safety awareness. Educated on cervical precautions for bed mobility, transfers and ADLs with further reinforced needed. Continue to rec SNF rehab based on deficits with pt agreeable to this recommendation.    Recommendations for follow up therapy are one component of a multi-disciplinary discharge planning process, led by the attending physician.  Recommendations may be updated based on patient status, additional functional criteria and insurance authorization.    Follow Up Recommendations  Skilled nursing-short term rehab (<3 hours/day)    Assistance Recommended at Discharge Frequent or constant Supervision/Assistance  Patient can return home with the following  A lot of help with bathing/dressing/bathroom;Assist for transportation;Assistance with cooking/housework;A little help with walking and/or transfers;Direct supervision/assist for medications management;Direct supervision/assist for financial management   Equipment Recommendations  BSC/3in1;Tub/shower bench;Wheelchair (measurements OT);Wheelchair cushion (measurements OT)    Recommendations for Other Services      Precautions / Restrictions Precautions Precautions: Fall;Cervical Precaution Booklet Issued: Yes (comment) Precaution Comments: no brace needed per  orders Restrictions Weight Bearing Restrictions: No       Mobility Bed Mobility Overal bed mobility: Needs Assistance Bed Mobility: Supine to Sit, Sit to Supine     Supine to sit: Supervision, HOB elevated Sit to supine: Min assist, HOB elevated   General bed mobility comments: use of bedrail, cues for cervical precautions but able to sit EOB without assist. Min A to get B LE back into bed    Transfers Overall transfer level: Needs assistance Equipment used: 1 person hand held assist Transfers: Sit to/from Stand Sit to Stand: Min guard           General transfer comment: impulsively standing at bedside holding to bedrail, min guard for safety and to hold IV line to prevent pulling     Balance Overall balance assessment: Needs assistance Sitting-balance support: Feet supported, Bilateral upper extremity supported Sitting balance-Leahy Scale: Fair     Standing balance support: Single extremity supported, During functional activity Standing balance-Leahy Scale: Poor                             ADL either performed or assessed with clinical judgement   ADL Overall ADL's : Needs assistance/impaired Eating/Feeding: Independent;Bed level                   Lower Body Dressing: Moderate assistance;Sitting/lateral leans;Sit to/from stand Lower Body Dressing Details (indicate cue type and reason): able to cross B LE to reach socks, will need increased assist in standing due to balance               General ADL Comments: Pt with improving pain levels since sx, though still limited by coordination, safety awareness and active participation in tasks    Extremity/Trunk Assessment Upper Extremity Assessment Upper Extremity Assessment: RUE deficits/detail;LUE deficits/detail RUE Deficits /  Details: reports decreased sensation in digits that impairs ability to hold items. weaker grasp RUE Sensation: decreased light touch;decreased proprioception RUE  Coordination: decreased fine motor;decreased gross motor LUE Deficits / Details: reports decreased sensation in digits that impairs ability to hold items. LUE Sensation: decreased light touch LUE Coordination: decreased fine motor   Lower Extremity Assessment Lower Extremity Assessment: Defer to PT evaluation        Vision   Vision Assessment?: No apparent visual deficits   Perception     Praxis      Cognition Arousal/Alertness: Awake/alert Behavior During Therapy: Flat affect, Impulsive Overall Cognitive Status: No family/caregiver present to determine baseline cognitive functioning Area of Impairment: Safety/judgement, Awareness, Problem solving, Attention                   Current Attention Level: Selective Memory: Decreased short-term memory Following Commands: Follows one step commands with increased time Safety/Judgement: Decreased awareness of safety Awareness: Emergent Problem Solving: Requires verbal cues, Requires tactile cues General Comments: Pt with poor awareness of safety and situation, impulsive and benefits from safety cues        Exercises      Shoulder Instructions       General Comments      Pertinent Vitals/ Pain       Pain Assessment Pain Assessment: No/denies pain Pain Intervention(s): RN gave pain meds during session  Home Living                                          Prior Functioning/Environment              Frequency  Min 2X/week        Progress Toward Goals  OT Goals(current goals can now be found in the care plan section)  Progress towards OT goals: Progressing toward goals  Acute Rehab OT Goals Patient Stated Goal: get insurance sorted out, go to rehab OT Goal Formulation: With patient Time For Goal Achievement: 07/03/21 Potential to Achieve Goals: Fair ADL Goals Pt Will Perform Grooming: with supervision;standing Pt Will Perform Upper Body Bathing: with set-up;sitting Pt Will  Perform Lower Body Bathing: with set-up;sitting/lateral leans Pt Will Perform Upper Body Dressing: with set-up;sitting Pt Will Perform Lower Body Dressing: with set-up;sitting/lateral leans Pt Will Transfer to Toilet: with supervision;bedside commode Pt Will Perform Toileting - Clothing Manipulation and hygiene: with supervision;sitting/lateral leans Pt/caregiver will Perform Home Exercise Program: Increased strength;Both right and left upper extremity;With theraband;With Supervision;With written HEP provided Additional ADL Goal #1: Pt will tolerate 3 minutes of dynamic standing task wtih min G as a precursor to ADL performance  Plan Discharge plan remains appropriate    Co-evaluation                 AM-PAC OT "6 Clicks" Daily Activity     Outcome Measure   Help from another person eating meals?: None Help from another person taking care of personal grooming?: A Little Help from another person toileting, which includes using toliet, bedpan, or urinal?: A Lot Help from another person bathing (including washing, rinsing, drying)?: A Lot Help from another person to put on and taking off regular upper body clothing?: A Little Help from another person to put on and taking off regular lower body clothing?: A Lot 6 Click Score: 16    End of Session    OT Visit Diagnosis: Unsteadiness on feet (  R26.81);Other abnormalities of gait and mobility (R26.89);Muscle weakness (generalized) (M62.81);History of falling (Z91.81) Hemiplegia - Right/Left: Right Hemiplegia - dominant/non-dominant: Dominant Hemiplegia - caused by: Unspecified   Activity Tolerance Patient tolerated treatment well   Patient Left in bed;with call bell/phone within reach;Other (comment) (MDs at bedside)   Nurse Communication Mobility status        Time: [phone] OT Time Calculation (min): 24 min  Charges: OT General Charges $OT Visit: 1 Visit OT Treatments $Self Care/Home Management : 8-22 mins $Therapeutic  Activity: 8-22 mins  Bradd Canary, OTR/L Acute Rehab Services Office: [phone]   Lorre Munroe 06/24/2021, 9:41 AM

## 2021-06-24 NOTE — Progress Notes (Addendum)
[facility]                                                                               Logan Nelson, is a 56 y.o. male, DOB - March 21, 1965, [id number] Admit date - 06/16/2021    Outpatient Primary MD for the patient is [name], MD  LOS - 7  days    Brief summary   56 year old gentleman with history of hypertension, anasarca, recently admitted for incarcerated abdominal hernia with primary repair  Who left [place] before he was placed to a rehab.  He subsequently again admitted from 4/4-5/11 with severe debility and malnutrition and failure to thrive.  While he was in the hospital, developed MSSA bacteremia and sepsis, back pain. Was under treatment but left AMA 5/10 just to come back due to profound weakness and now decided he will complete treatment.   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  Source from ? sacral decubitus ulcer Blood cultures from 06/14/2021 show MSSA and repeat cultures have been negative so far.   TTE is negative for age vegetation.  Will check with cardiology for TEE.  Patient remains on Ancef, ID on board. Neurosurgery on board  and pt underwent anterior cervical decompression and abscess evacuation with placement of screws.  Intra operative cultures are pending.  Anticipate prolonged IV antibiotics up to 8 weeks.    Acute systolic congestive heart failure Echocardiogram showed left ventricular ejection fraction of 30 to 35%.  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 Currently euvolemic after diuresis. Patient will need ischemic evaluation after infection improved.    Severe protein calorie malnutrition Dietary on board.   Hypertension Blood pressure parameters are well controlled.     Hypomagnesemia replaced Recheck in am.    Anemia of chronic disease Continue to monitor. Normocytic.    Severe debility and frailty Therapy  evaluations recommending SNF  Severe malnutrition.  Dietary on board to assist with supplementation.     RN Pressure Injury Documentation: Pressure Injury 06/17/21 Sacrum Stage 3 -  Full thickness tissue loss. Subcutaneous fat may be visible but bone, tendon or muscle are NOT exposed. 2 in vertical by .5 in horizontal--approximately--wound tunneling and pink (Active)  06/17/21 1159  Location: Sacrum  Location Orientation:   Staging: Stage 3 -  Full thickness tissue loss. Subcutaneous fat may be visible but bone, tendon or muscle are NOT exposed.  Wound Description (Comments): 2 in vertical by .5 in horizontal--approximately--wound tunneling and pink  Present on Admission:   Dressing Type Foam - Lift dressing to assess site every shift 06/24/21 0900  Local wound care.   Malnutrition Type:  Nutrition Problem: Severe Malnutrition Etiology: chronic illness (h/o hernia)   Malnutrition Characteristics:  Signs/Symptoms: severe fat depletion, severe muscle depletion   Nutrition Interventions:  Interventions: Boost Breeze, MVI, Prostat, Refer to RD note for recommendations  Estimated body mass index is 23.96 kg/m as calculated from the following:   Height as of this encounter: 5' 5.98" (1.676 m).   Weight as of this encounter: 67.3 kg.  Code Status: full code.  DVT Prophylaxis:  heparin injection  5,000 Units Start: 06/23/21 1715 SCD's Start: 06/23/21 1621 SCDs Start: 06/17/21 1144   Level of Care: Level of care: Med-Surg Family Communication: none at bedside.   Disposition Plan:     Remains inpatient appropriate:  IV antibiotics.   Procedures:  anterior cervical decompression and arthrodesis for abscess at levels C4/5.by Dr Christella Noa on 5/17  Consultants:   Neuro surgery.   Antimicrobials:   Anti-infectives (From admission, onward)    Start     Dose/Rate Route Frequency Ordered Stop   06/24/21 0200  ceFAZolin (ANCEF) IVPB 2g/100 mL premix        2 g 200 mL/hr over  30 Minutes Intravenous Every 8 hours 06/21/21 1058     06/23/21 1514  vancomycin (VANCOCIN) powder  Status:  Discontinued          As needed 06/23/21 1515 06/23/21 1524   06/17/21 1230  Ampicillin-Sulbactam (UNASYN) 3 g in sodium chloride 0.9 % 100 mL IVPB        3 g 200 mL/hr over 30 Minutes Intravenous Every 6 hours 06/17/21 1219 06/23/21 2359   06/17/21 0430  Ampicillin-Sulbactam (UNASYN) 3 g in sodium chloride 0.9 % 100 mL IVPB        3 g 200 mL/hr over 30 Minutes Intravenous  Once 06/17/21 0407 06/17/21 0619   06/17/21 0415  metroNIDAZOLE (FLAGYL) IVPB 500 mg  Status:  Discontinued        500 mg 100 mL/hr over 60 Minutes Intravenous Every 12 hours 06/17/21 0407 06/17/21 1226        Medications  Scheduled Meds:  docusate sodium  100 mg Oral BID   furosemide  20 mg Oral Daily   heparin injection (subcutaneous)  5,000 Units Subcutaneous Q8H   lidocaine  1 patch Transdermal Q24H   metoprolol tartrate  25 mg Oral BID   sodium chloride flush  3 mL Intravenous Q12H   sodium chloride flush  3 mL Intravenous Q12H   Continuous Infusions:  sodium chloride 250 mL (06/23/21 2000)   0.9 % NaCl with KCl 20 mEq / L Stopped (06/24/21 0059)    ceFAZolin (ANCEF) IV 2 g (06/24/21 0909)   PRN Meds:.acetaminophen **OR** acetaminophen, bisacodyl, diazepam, hydrALAZINE, menthol-cetylpyridinium **OR** phenol, oxyCODONE, polyethylene glycol, sodium chloride flush    Subjective:   Logan Nelson was seen and examined today.  Pain better controlled.  No other complaints.  Objective:   Vitals:   06/24/21 0010 06/24/21 0445 06/24/21 0754 06/24/21 1545  BP: (!) 120/97 107/80 108/81 118/78  Pulse: 100 85 72 100  Resp: 19 19 18 16   Temp: 98.3 F (36.8 C) 97.6 F (36.4 C) 97.9 F (36.6 C) 98.1 F (36.7 C)  TempSrc: Oral Oral Oral Oral  SpO2: 100% 100% 97% 98%  Weight:      Height:        Intake/Output Summary (Last 24 hours) at 06/24/2021 1625 Last data filed at 06/24/2021 1617 Gross  per 24 hour  Intake 1850.68 ml  Output 950 ml  Net 900.68 ml    Filed Weights   06/18/21 0600  Weight: 67.3 kg     Exam General exam: Appears calm and comfortable  Respiratory system: Clear to auscultation. Respiratory effort normal. Cardiovascular system: S1 & S2 heard, RRR. No JVD, . No pedal edema. Gastrointestinal system: Abdomen is nondistended, soft and nontender. Normal bowel sounds heard. Central nervous system: Alert and oriented. No focal neurological deficits. Extremities: Symmetric 5 x 5 power. Skin: No rashes, lesions or ulcers Psychiatry:  Mood & affect appropriate.     Data Reviewed:  I have personally reviewed following labs and imaging studies   CBC Lab Results  Component Value Date   WBC 13.3 (H) 06/23/2021   RBC 3.38 (L) 06/23/2021   HGB 10.0 (L) 06/23/2021   HCT 31.3 (L) 06/23/2021   MCV 92.6 06/23/2021   MCH 29.6 06/23/2021   PLT 458 (H) 06/23/2021   MCHC 31.9 06/23/2021   RDW 17.9 (H) 06/23/2021   LYMPHSABS 2.5 06/23/2021   MONOABS 1.4 (H) 06/23/2021   EOSABS 0.3 06/23/2021   BASOSABS 0.0 [id number]     Last metabolic panel Lab Results  Component Value Date   NA 136 06/23/2021   K 4.0 06/23/2021   CL 104 06/23/2021   CO2 25 06/23/2021   BUN 12 06/23/2021   CREATININE 0.51 (L) 06/23/2021   GLUCOSE 78 06/23/2021   GFRNONAA >60 06/23/2021   GFRAA >60 01/23/2019   CALCIUM 8.3 (L) 06/23/2021   PHOS 3.3 06/23/2021   PROT 5.6 (L) 06/23/2021   ALBUMIN 2.3 (L) 06/23/2021   LABGLOB 2.6 04/13/2021   AGRATIO 1.0 04/13/2021   BILITOT 0.8 06/23/2021   ALKPHOS 72 06/23/2021   AST 20 06/23/2021   ALT 13 06/23/2021   ANIONGAP 7 06/23/2021    CBG (last 3)  No results for input(s): GLUCAP in the last 72 hours.    Coagulation Profile: No results for input(s): INR, PROTIME in the last 168 hours.   Radiology Studies: DG Cervical Spine 2 or 3 views  Result Date: 06/23/2021 CLINICAL DATA:  Cervical decompression and epidural abscess  evacuation. EXAM: CERVICAL SPINE - 2-3 VIEW COMPARISON:  MRI of Jun 20, 2021. FINDINGS: Four intraoperative cross-table lateral projections were obtained of the cervical spine. The first image demonstrates surgical probe at approximately the C5 level. The second image demonstrates surgical probe at the C3-4 level. The third image demonstrates surgical probe at the C4-5 level. The fourth image demonstrates the patient be status post surgical anterior fusion of C4-5. IMPRESSION: Surgical localization during surgical anterior fusion of C4-5. Electronically Signed   By: Marijo Conception M.D.   On: 06/23/2021 15:41       Hosie Poisson M.D. [facility] 06/24/2021, 4:25 PM  Available via Epic secure chat 7am-7pm After 7 pm, please refer to night coverage provider listed on amion.

## 2021-06-25 DIAGNOSIS — I5021 Acute systolic (congestive) heart failure: Secondary | ICD-10-CM | POA: Diagnosis not present

## 2021-06-25 DIAGNOSIS — R7881 Bacteremia: Secondary | ICD-10-CM | POA: Diagnosis not present

## 2021-06-25 DIAGNOSIS — D638 Anemia in other chronic diseases classified elsewhere: Secondary | ICD-10-CM | POA: Diagnosis not present

## 2021-06-25 DIAGNOSIS — B9561 Methicillin susceptible Staphylococcus aureus infection as the cause of diseases classified elsewhere: Secondary | ICD-10-CM | POA: Diagnosis not present

## 2021-06-25 DIAGNOSIS — R531 Weakness: Secondary | ICD-10-CM | POA: Diagnosis not present

## 2021-06-25 DIAGNOSIS — G062 Extradural and subdural abscess, unspecified: Secondary | ICD-10-CM | POA: Diagnosis not present

## 2021-06-25 LAB — CBC WITH DIFFERENTIAL/PLATELET
Abs Immature Granulocytes: 0.06 10*3/uL (ref 0.00–0.07)
Basophils Absolute: 0 10*3/uL (ref 0.0–0.1)
Basophils Relative: 0 %
Eosinophils Absolute: 0.1 10*3/uL (ref 0.0–0.5)
Eosinophils Relative: 1 %
HCT: 29.7 % — ABNORMAL LOW (ref 39.0–52.0)
Hemoglobin: 9.1 g/dL — ABNORMAL LOW (ref 13.0–17.0)
Immature Granulocytes: 0 %
Lymphocytes Relative: 14 %
Lymphs Abs: 2.1 10*3/uL (ref 0.7–4.0)
MCH: 29.1 pg (ref 26.0–34.0)
MCHC: 30.6 g/dL (ref 30.0–36.0)
MCV: 94.9 fL (ref 80.0–100.0)
Monocytes Absolute: 1.4 10*3/uL — ABNORMAL HIGH (ref 0.1–1.0)
Monocytes Relative: 9 %
Neutro Abs: 10.9 10*3/uL — ABNORMAL HIGH (ref 1.7–7.7)
Neutrophils Relative %: 76 %
Platelets: 423 10*3/uL — ABNORMAL HIGH (ref 150–400)
RBC: 3.13 MIL/uL — ABNORMAL LOW (ref 4.22–5.81)
RDW: 17.9 % — ABNORMAL HIGH (ref 11.5–15.5)
WBC: 14.6 10*3/uL — ABNORMAL HIGH (ref 4.0–10.5)
nRBC: 0 % (ref 0.0–0.2)

## 2021-06-25 LAB — BASIC METABOLIC PANEL
Anion gap: 6 (ref 5–15)
BUN: 14 mg/dL (ref 6–20)
CO2: 24 mmol/L (ref 22–32)
Calcium: 8.1 mg/dL — ABNORMAL LOW (ref 8.9–10.3)
Chloride: 107 mmol/L (ref 98–111)
Creatinine, Ser: 0.46 mg/dL — ABNORMAL LOW (ref 0.61–1.24)
GFR, Estimated: >60 mL/min (ref >60)
Glucose, Bld: 95 mg/dL (ref 70–99)
Potassium: 3.3 mmol/L — ABNORMAL LOW (ref 3.5–5.1)
Sodium: 137 mmol/L (ref 135–145)

## 2021-06-25 MED ORDER — MAGNESIUM HYDROXIDE 400 MG/5ML PO SUSP: 30.0000 mL | Freq: Every day | ORAL | Status: DC | PRN

## 2021-06-25 MED ORDER — SENNOSIDES-DOCUSATE SODIUM 8.6-50 MG PO TABS
2.0000 | ORAL_TABLET | Freq: Two times a day (BID) | ORAL | Status: DC
  Administered 2021-06-25 – 2021-07-03 (×9): 2 via ORAL
  Filled 2021-06-25 (×14): qty 2

## 2021-06-25 MED ORDER — POLYETHYLENE GLYCOL 3350 17 G PO PACK
17.0000 g | PACK | Freq: Two times a day (BID) | ORAL | Status: DC
  Administered 2021-06-25 – 2021-06-26 (×3): 17 g via ORAL
  Filled 2021-06-25 (×3): qty 1

## 2021-06-25 NOTE — Plan of Care (Signed)
  Problem: Education: Goal: Knowledge of General Education information will improve Description Including pain rating scale, medication(s)/side effects and non-pharmacologic comfort measures Outcome: Progressing   Problem: Health Behavior/Discharge Planning: Goal: Ability to manage health-related needs will improve Outcome: Progressing   

## 2021-06-25 NOTE — NC FL2 (Signed)
[place] LEVEL OF CARE SCREENING TOOL     IDENTIFICATION  Patient Name: Logan Nelson Birthdate: [birth year] Sex: male Admission Date (Current Location): 06/16/2021  [facility] and [place] Number:  Herbalist and Address:  [facility], [place] [address], [place], [place] [address]      Provider Number: [id number]  Attending Physician Name and Address:  Hosie Poisson, MD  Relative Name and Phone Number:       Current Level of Care: Hospital Recommended Level of Care: [place] Prior Approval Number:    Date Approved/Denied:   PASRR Number: [id number]  Discharge Plan: SNF    Current Diagnoses: Patient Active Problem List   Diagnosis Date Noted   Abscess in epidural space of cervical spine 06/23/2021   Epidural abscess    Acute systolic (congestive) heart failure ([place]) 06/17/2021   Prolonged QT interval 06/17/2021   MSSA bacteremia    Pressure ulcer of sacral region, stage 3 ([place])    Acute heart failure with preserved ejection fraction (HFpEF) ([place]) 06/13/2021   Anasarca    Pressure injury of sacral region, unstageable ([place]) 05/25/2021   Pressure injury of buttock, stage 2 ([place]) 05/14/2021   Hypomagnesemia 05/13/2021   Vitamin D deficiency 05/13/2021   Generalized weakness 05/12/2021   Hypophosphatemia 05/12/2021   Lower extremity weakness 05/11/2021   Hypokalemia 05/11/2021   Anemia of chronic disease 05/11/2021   GERD (gastroesophageal reflux disease) 05/11/2021   Bilateral inguinal hernia 04/23/2021   Protein-calorie malnutrition, severe 04/14/2021   Protein calorie malnutrition ([place]) 04/13/2021   Protein-calorie malnutrition ([place]) 04/12/2021   Hepatic steatosis 04/12/2021   Stroke ([place]) 04/11/2021   Small bowel obstruction ([place]) 10/13/2020   Anemia due to vitamin B12 deficiency 10/13/2020   Hypertension 01/09/2015   Incisional hernia 01/07/2015    Orientation RESPIRATION BLADDER Height &  Weight     Self, Time, Situation, Place  Normal Incontinent Weight: 148 lb 5.9 oz (67.3 kg) Height:  5' 5.98" (167.6 cm)  BEHAVIORAL SYMPTOMS/MOOD NEUROLOGICAL BOWEL NUTRITION STATUS      Incontinent    AMBULATORY STATUS COMMUNICATION OF NEEDS Skin   Extensive Assist Verbally Normal                       Personal Care Assistance Level of Assistance  Dressing, Bathing, Feeding Bathing Assistance: Limited assistance Feeding assistance: Limited assistance Dressing Assistance: Limited assistance     Functional Limitations Info  Sight, Hearing, Speech Sight Info: Impaired Hearing Info: Impaired Speech Info: Adequate    SPECIAL CARE FACTORS FREQUENCY  PT (By licensed PT), OT (By licensed OT) (IV antibiotics)                    Contractures Contractures Info: Not present    Additional Factors Info  Code Status Code Status Info: FULL CODE             Current Medications (06/25/2021):  This is the current hospital active medication list Current Facility-Administered Medications  Medication Dose Route Frequency Provider Last Rate Last Admin   0.9 %  sodium chloride infusion  250 mL Intravenous Continuous Ashok Pall, MD 1 mL/hr at 06/23/21 2000 250 mL at 06/23/21 2000   0.9 % NaCl with KCl 20 mEq/ L  infusion   Intravenous Continuous Ashok Pall, MD   Stopped at 06/24/21 0059   acetaminophen (TYLENOL) tablet 650 mg  650 mg Oral Q4H PRN Ashok Pall, MD  Or   acetaminophen (TYLENOL) suppository 650 mg  650 mg Rectal Q4H PRN Ashok Pall, MD       bisacodyl (DULCOLAX) EC tablet 5 mg  5 mg Oral Daily PRN Ashok Pall, MD       ceFAZolin (ANCEF) IVPB 2g/100 mL premix  2 g Intravenous Q8H Ashok Pall, MD 200 mL/hr at 06/25/21 0955 2 g at 06/25/21 0955   diazepam (VALIUM) tablet 5 mg  5 mg Oral Q6H PRN Ashok Pall, MD   5 mg at 06/25/21 0951   feeding supplement (BOOST / RESOURCE BREEZE) liquid 1 Container  1 Container Oral TID BM Hosie Poisson, MD   1  Container at 06/25/21 0957   feeding supplement (PROSource TF) liquid 45 mL  45 mL Per Tube TID Hosie Poisson, MD   45 mL at 06/25/21 0956   furosemide (LASIX) tablet 20 mg  20 mg Oral Daily Ashok Pall, MD   20 mg at 06/25/21 0951   heparin injection 5,000 Units  5,000 Units Subcutaneous Q8H Ashok Pall, MD   5,000 Units at 06/25/21 0502   hydrALAZINE (APRESOLINE) injection 5 mg  5 mg Intravenous Q4H PRN Ashok Pall, MD       lidocaine (LIDODERM) 5 % 1 patch  1 patch Transdermal Q24H Ashok Pall, MD   1 patch at 06/25/21 0956   magnesium hydroxide (MILK OF MAGNESIA) suspension 30 mL  30 mL Oral Daily PRN Hosie Poisson, MD       menthol-cetylpyridinium (CEPACOL) lozenge 3 mg  1 lozenge Oral PRN Ashok Pall, MD       Or   phenol (CHLORASEPTIC) mouth spray 1 spray  1 spray Mouth/Throat PRN Ashok Pall, MD       metoprolol tartrate (LOPRESSOR) tablet 25 mg  25 mg Oral BID Ashok Pall, MD   25 mg at 06/25/21 [id number]   multivitamin with minerals tablet 1 tablet  1 tablet Oral Daily Hosie Poisson, MD   1 tablet at 06/25/21 [id number]   oxyCODONE (Oxy IR/ROXICODONE) immediate release tablet 10 mg  10 mg Oral Q4H PRN Ashok Pall, MD   10 mg at 06/25/21 [id number]   polyethylene glycol (MIRALAX / GLYCOLAX) packet 17 g  17 g Oral BID Hosie Poisson, MD       senna-docusate (Senokot-S) tablet 2 tablet  2 tablet Oral BID Hosie Poisson, MD       sodium chloride flush (NS) 0.9 % injection 10-40 mL  10-40 mL Intracatheter Q12H Hosie Poisson, MD   10 mL at 06/25/21 0957   sodium chloride flush (NS) 0.9 % injection 10-40 mL  10-40 mL Intracatheter PRN Hosie Poisson, MD       sodium chloride flush (NS) 0.9 % injection 3 mL  3 mL Intravenous Q12H Ashok Pall, MD   3 mL at 06/24/21 1121   sodium chloride flush (NS) 0.9 % injection 3 mL  3 mL Intravenous Q12H Ashok Pall, MD   3 mL at 06/24/21 2310   sodium chloride flush (NS) 0.9 % injection 3 mL  3 mL Intravenous PRN Ashok Pall, MD         Discharge  Medications: Please see discharge summary for a list of discharge medications.  Relevant Imaging Results:  Relevant Lab Results:   Additional Information SS# [id number]  Wandra Feinstein [place], [place]

## 2021-06-25 NOTE — Progress Notes (Signed)
SW following for SNF placement, will need continued rehab and longterm IV abx (currently on cefazolin). Pt does have [employer] Medicaid per chart and confirmed in [facility]. Pt with barriers to SNF placement including managed MCD, history of substance use, and history of non-compliance.   SNF search started, will f/u with offers as available.   Dellie Burns, MSW, LCSW [phone] (coverage)

## 2021-06-25 NOTE — Plan of Care (Signed)
?  Problem: Elimination: ?Goal: Will not experience complications related to bowel motility ?Outcome: Not Progressing ?  ?Problem: Nutrition: ?Goal: Adequate nutrition will be maintained ?Outcome: Not Progressing ?  ?Problem: Safety: ?Goal: Ability to remain free from injury will improve ?Outcome: Not Progressing ?  ?Problem: Skin Integrity: ?Goal: Risk for impaired skin integrity will decrease ?Outcome: Not Progressing ?  ?

## 2021-06-25 NOTE — Progress Notes (Signed)
[facility]                                                                               Logan Nelson, is a 56 y.o. male, DOB - 1965-02-14, [id number] Admit date - 06/16/2021    Outpatient Primary MD for the patient is Logan Mai, MD  LOS - 8  days    Brief summary   56 year old gentleman with history of hypertension, anasarca, recently admitted for incarcerated abdominal hernia with primary repair  Who left Logan Nelson before he was placed to a rehab.  He subsequently again admitted from 4/4-5/11 with severe debility and malnutrition and failure to thrive.  While he was in the hospital, developed MSSA bacteremia and sepsis, back pain. Was under treatment but left AMA 5/10 just to come back due to profound weakness and now decided he will complete treatment.   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  Source from ? sacral decubitus ulcer Blood cultures from 06/14/2021 show MSSA and repeat cultures have been negative so far.   TTE is negative for age vegetation.  Will check with cardiology for TEE.  Patient remains on Ancef, ID on board. Neurosurgery on board  and pt underwent anterior cervical decompression and abscess evacuation with placement of screws.  Intra operative cultures are pending.  Anticipate prolonged IV antibiotics up to 8 weeks. Unfortunately pt is difficult to place in SNF.     Acute systolic congestive heart failure Echocardiogram showed left ventricular ejection fraction of 30 to 35%.  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 Currently euvolemic after diuresis. Patient will need ischemic evaluation after infection improved.    Severe protein calorie malnutrition Dietary on board.   Hypertension Blood pressure parameters are well controlled.     Hypomagnesemia replaced Recheck in am.    Anemia of chronic disease Continue to  monitor. Normocytic.    Severe debility and frailty Therapy evaluations recommending SNF  Severe malnutrition.  Dietary on board to assist with supplementation.    Constipation:  - started him on bowel regimen.     RN Pressure Injury Documentation: Pressure Injury 06/17/21 Sacrum Stage 3 -  Full thickness tissue loss. Subcutaneous fat may be visible but bone, tendon or muscle are NOT exposed. 2 in vertical by .5 in horizontal--approximately--wound tunneling and pink (Active)  06/17/21 1159  Location: Sacrum  Location Orientation:   Staging: Stage 3 -  Full thickness tissue loss. Subcutaneous fat may be visible but bone, tendon or muscle are NOT exposed.  Wound Description (Comments): 2 in vertical by .5 in horizontal--approximately--wound tunneling and pink  Present on Admission:   Dressing Type Foam - Lift dressing to assess site every shift 06/24/21 2047  Local wound care.   Malnutrition Type:  Nutrition Problem: Severe Malnutrition Etiology: chronic illness (h/o hernia)   Malnutrition Characteristics:  Signs/Symptoms: severe fat depletion, severe muscle depletion   Nutrition Interventions:  Interventions: Boost Breeze, MVI, Prostat, Refer to RD note for recommendations  Estimated body mass index is 23.96 kg/m as calculated from the following:   Height as of this encounter: 5' 5.98" (1.676 m).  Weight as of this encounter: 67.3 kg.  Code Status: full code.  DVT Prophylaxis:  heparin injection 5,000 Units Start: 06/23/21 1715 SCD's Start: 06/23/21 1621 SCDs Start: 06/17/21 1144   Level of Care: Level of care: Med-Surg Family Communication: none at bedside.   Disposition Plan:     Remains inpatient appropriate:  IV antibiotics.   Procedures:  anterior cervical decompression and arthrodesis for abscess at levels C4/5.by Dr Logan Nelson on 5/17  Consultants:   Neuro surgery.   Antimicrobials:   Anti-infectives (From admission, onward)    Start      Dose/Rate Route Frequency Ordered Stop   06/24/21 0200  ceFAZolin (ANCEF) IVPB 2g/100 mL premix        2 g 200 mL/hr over 30 Minutes Intravenous Every 8 hours 06/21/21 1058     06/23/21 1514  vancomycin (VANCOCIN) powder  Status:  Discontinued          As needed 06/23/21 1515 06/23/21 1524   06/17/21 1230  Ampicillin-Sulbactam (UNASYN) 3 g in sodium chloride 0.9 % 100 mL IVPB        3 g 200 mL/hr over 30 Minutes Intravenous Every 6 hours 06/17/21 1219 06/23/21 2359   06/17/21 0430  Ampicillin-Sulbactam (UNASYN) 3 g in sodium chloride 0.9 % 100 mL IVPB        3 g 200 mL/hr over 30 Minutes Intravenous  Once 06/17/21 0407 06/17/21 0619   06/17/21 0415  metroNIDAZOLE (FLAGYL) IVPB 500 mg  Status:  Discontinued        500 mg 100 mL/hr over 60 Minutes Intravenous Every 12 hours 06/17/21 0407 06/17/21 1226        Medications  Scheduled Meds:  feeding supplement  1 Container Oral TID BM   feeding supplement (PROSource TF)  45 mL Per Tube TID   furosemide  20 mg Oral Daily   heparin injection (subcutaneous)  5,000 Units Subcutaneous Q8H   lidocaine  1 patch Transdermal Q24H   metoprolol tartrate  25 mg Oral BID   multivitamin with minerals  1 tablet Oral Daily   polyethylene glycol  17 g Oral BID   senna-docusate  2 tablet Oral BID   sodium chloride flush  10-40 mL Intracatheter Q12H   sodium chloride flush  3 mL Intravenous Q12H   sodium chloride flush  3 mL Intravenous Q12H   Continuous Infusions:  sodium chloride 250 mL (06/23/21 2000)   0.9 % NaCl with KCl 20 mEq / L Stopped (06/24/21 0059)    ceFAZolin (ANCEF) IV 2 g (06/25/21 0955)   PRN Meds:.acetaminophen **OR** acetaminophen, bisacodyl, diazepam, hydrALAZINE, magnesium hydroxide, menthol-cetylpyridinium **OR** phenol, oxyCODONE, sodium chloride flush, sodium chloride flush    Subjective:   Logan Nelson was seen and examined today.  Requesting stool softeners. No BM yet.  No chest pain or sob.  Able to move all  extremities.  Objective:   Vitals:   06/24/21 1545 06/24/21 2105 06/25/21 0505 06/25/21 0724  BP: 118/78 127/78 (!) 125/93 (!) 137/94  Pulse: 100 (!) 110 96 100  Resp: 16 18 18 16   Temp: 98.1 F (36.7 C) 98.7 F (37.1 C) 98 F (36.7 C) 98.1 F (36.7 C)  TempSrc: Oral Oral Oral Oral  SpO2: 98% 98% 98% 100%  Weight:      Height:        Intake/Output Summary (Last 24 hours) at 06/25/2021 1421 Last data filed at 06/25/2021 0546 Gross per 24 hour  Intake 846.68 ml  Output 750 ml  Net 96.68 ml    Filed Weights   06/18/21 0600  Weight: 67.3 kg     Exam General exam: Appears calm and comfortable  Respiratory system: Clear to auscultation. Respiratory effort normal. Cardiovascular system: S1 & S2 heard, RRR. No JVD. No pedal edema. Gastrointestinal system: Abdomen is nondistended, soft and nontender.  Normal bowel sounds heard. Central nervous system: Alert and oriented. No focal neurological deficits. Extremities: Symmetric 5 x 5 power. Skin: No rashes, lesions or ulcers Psychiatry: . Mood & affect appropriate.      Data Reviewed:  I have personally reviewed following labs and imaging studies   CBC Lab Results  Component Value Date   WBC 14.6 (H) 06/25/2021   RBC 3.13 (L) 06/25/2021   HGB 9.1 (L) 06/25/2021   HCT 29.7 (L) 06/25/2021   MCV 94.9 06/25/2021   MCH 29.1 06/25/2021   PLT 423 (H) 06/25/2021   MCHC 30.6 06/25/2021   RDW 17.9 (H) 06/25/2021   LYMPHSABS 2.1 06/25/2021   MONOABS 1.4 (H) 06/25/2021   EOSABS 0.1 06/25/2021   BASOSABS 0.0 [id number]     Last metabolic panel Lab Results  Component Value Date   NA 137 06/25/2021   K 3.3 (L) 06/25/2021   CL 107 06/25/2021   CO2 24 06/25/2021   BUN 14 06/25/2021   CREATININE 0.46 (L) 06/25/2021   GLUCOSE 95 06/25/2021   GFRNONAA >60 06/25/2021   GFRAA >60 01/23/2019   CALCIUM 8.1 (L) 06/25/2021   PHOS 3.3 06/23/2021   PROT 5.6 (L) 06/23/2021   ALBUMIN 2.3 (L) 06/23/2021   LABGLOB 2.6 04/13/2021    AGRATIO 1.0 04/13/2021   BILITOT 0.8 06/23/2021   ALKPHOS 72 06/23/2021   AST 20 06/23/2021   ALT 13 06/23/2021   ANIONGAP 6 06/25/2021    CBG (last 3)  No results for input(s): GLUCAP in the last 72 hours.    Coagulation Profile: No results for input(s): INR, PROTIME in the last 168 hours.   Radiology Studies: DG Cervical Spine 2 or 3 views  Result Date: 06/23/2021 CLINICAL DATA:  Cervical decompression and epidural abscess evacuation. EXAM: CERVICAL SPINE - 2-3 VIEW COMPARISON:  MRI of Jun 20, 2021. FINDINGS: Four intraoperative cross-table lateral projections were obtained of the cervical spine. The first image demonstrates surgical probe at approximately the C5 level. The second image demonstrates surgical probe at the C3-4 level. The third image demonstrates surgical probe at the C4-5 level. The fourth image demonstrates the patient be status post surgical anterior fusion of C4-5. IMPRESSION: Surgical localization during surgical anterior fusion of C4-5. Electronically Signed   By: Marijo Conception M.D.   On: 06/23/2021 15:41   [name] EKG SITE RITE  Result Date: 06/24/2021 If Site Rite image not attached, placement could not be confirmed due to current cardiac rhythm.      Hosie Poisson M.D. [facility] 06/25/2021, 2:21 PM  Available via Epic secure chat 7am-7pm After 7 pm, please refer to night coverage provider listed on amion.

## 2021-06-26 DIAGNOSIS — I5021 Acute systolic (congestive) heart failure: Secondary | ICD-10-CM | POA: Diagnosis not present

## 2021-06-26 DIAGNOSIS — D638 Anemia in other chronic diseases classified elsewhere: Secondary | ICD-10-CM | POA: Diagnosis not present

## 2021-06-26 DIAGNOSIS — R531 Weakness: Secondary | ICD-10-CM | POA: Diagnosis not present

## 2021-06-26 DIAGNOSIS — G062 Extradural and subdural abscess, unspecified: Secondary | ICD-10-CM | POA: Diagnosis not present

## 2021-06-26 DIAGNOSIS — R7881 Bacteremia: Secondary | ICD-10-CM | POA: Diagnosis not present

## 2021-06-26 DIAGNOSIS — B9561 Methicillin susceptible Staphylococcus aureus infection as the cause of diseases classified elsewhere: Secondary | ICD-10-CM | POA: Diagnosis not present

## 2021-06-26 LAB — CBC WITH DIFFERENTIAL/PLATELET
Abs Immature Granulocytes: 0.05 10*3/uL (ref 0.00–0.07)
Basophils Absolute: 0 10*3/uL (ref 0.0–0.1)
Basophils Relative: 0 %
Eosinophils Absolute: 0.3 10*3/uL (ref 0.0–0.5)
Eosinophils Relative: 3 %
HCT: 29.4 % — ABNORMAL LOW (ref 39.0–52.0)
Hemoglobin: 8.9 g/dL — ABNORMAL LOW (ref 13.0–17.0)
Immature Granulocytes: 0 %
Lymphocytes Relative: 23 %
Lymphs Abs: 2.6 10*3/uL (ref 0.7–4.0)
MCH: 28.9 pg (ref 26.0–34.0)
MCHC: 30.3 g/dL (ref 30.0–36.0)
MCV: 95.5 fL (ref 80.0–100.0)
Monocytes Absolute: 1.4 10*3/uL — ABNORMAL HIGH (ref 0.1–1.0)
Monocytes Relative: 12 %
Neutro Abs: 6.9 10*3/uL (ref 1.7–7.7)
Neutrophils Relative %: 62 %
Platelets: 395 10*3/uL (ref 150–400)
RBC: 3.08 MIL/uL — ABNORMAL LOW (ref 4.22–5.81)
RDW: 17.5 % — ABNORMAL HIGH (ref 11.5–15.5)
WBC: 11.3 10*3/uL — ABNORMAL HIGH (ref 4.0–10.5)
nRBC: 0 % (ref 0.0–0.2)

## 2021-06-26 LAB — BASIC METABOLIC PANEL
Anion gap: 4 — ABNORMAL LOW (ref 5–15)
BUN: 14 mg/dL (ref 6–20)
CO2: 28 mmol/L (ref 22–32)
Calcium: 8.2 mg/dL — ABNORMAL LOW (ref 8.9–10.3)
Chloride: 110 mmol/L (ref 98–111)
Creatinine, Ser: 0.53 mg/dL — ABNORMAL LOW (ref 0.61–1.24)
GFR, Estimated: >60 mL/min (ref >60)
Glucose, Bld: 79 mg/dL (ref 70–99)
Potassium: 3.5 mmol/L (ref 3.5–5.1)
Sodium: 142 mmol/L (ref 135–145)

## 2021-06-26 MED ORDER — POLYETHYLENE GLYCOL 3350 17 G PO PACK: 17.0000 g | PACK | Freq: Two times a day (BID) | ORAL | Status: DC | PRN

## 2021-06-26 NOTE — Progress Notes (Signed)
Patient ID: Logan Nelson, male   DOB: [birth year], 56 y.o.   MRN: [id number] BP 131/90 (BP Location: Right Arm)   Pulse 72   Temp 98.8 F (37.1 C) (Oral)   Resp 18   Ht 5' 5.98" (1.676 m)   Wt 67.3 kg   SpO2 99%   BMI 23.96 kg/m  Alert, oriented x 4 Moving upper extremities Needs a dental consultation Continue with abx, and therapy

## 2021-06-26 NOTE — Plan of Care (Signed)
  Problem: Clinical Measurements: Goal: Diagnostic test results will improve Outcome: Progressing   Problem: Activity: Goal: Risk for activity intolerance will decrease Outcome: Progressing   Problem: Nutrition: Goal: Adequate nutrition will be maintained Outcome: Progressing   Problem: Pain Managment: Goal: General experience of comfort will improve Outcome: Progressing   Problem: Skin Integrity: Goal: Risk for impaired skin integrity will decrease Outcome: Progressing   

## 2021-06-26 NOTE — Progress Notes (Signed)
Id brief note   Cervical OR cx staph aureus S/p instrumentation c4/5 on 5/17 Mssa bacteremia    A/p Continue cefazolin Will need 6 weeks iv abx then followed by chronic cefadroxil suppression given presence of hardware Data for rifampin use is not overwhelming in its favor and risk/benefit doesn't support its use  New id team to take over monday

## 2021-06-26 NOTE — Plan of Care (Signed)
  Problem: Clinical Measurements: Goal: Will remain free from infection Outcome: Progressing   Problem: Clinical Measurements: Goal: Diagnostic test results will improve Outcome: Progressing   Problem: Nutrition: Goal: Adequate nutrition will be maintained Outcome: Progressing   

## 2021-06-26 NOTE — Progress Notes (Signed)
[facility]                                                                               Logan Nelson, is a 56 y.o. male, DOB - December 03, 1965, [id number] Admit date - 06/16/2021    Outpatient Primary MD for the patient is [name], MD  LOS - 9  days    Brief summary   56 year old gentleman with history of hypertension, anasarca, recently admitted for incarcerated abdominal hernia with primary repair  Who left [place] before he was placed to a rehab.  He subsequently again admitted from 4/4-5/11 with severe debility and malnutrition and failure to thrive.  While he was in the hospital, developed MSSA bacteremia and sepsis, back pain. Was under treatment but left AMA 5/10 just to come back due to profound weakness and now decided he will complete treatment.   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  Source from ? sacral decubitus ulcer Blood cultures from 06/14/2021 show MSSA and repeat cultures have been negative so far.   TTE is negative for age vegetation.  Will check with cardiology for TEE.  Patient remains on Ancef, ID on board. Cultures growing staph aureus sensitive to oxacillin.  Neurosurgery on board  and pt underwent anterior cervical decompression and abscess evacuation with placement of screws.  Intra operative cultures are pending.  Anticipate prolonged IV antibiotics up to 8 weeks. Pt remains afebrile and leukocytosis is improving.  Unfortunately pt is difficult to place in SNF. Will need to stay inpatient for the course of IV antibiotics.     Acute systolic congestive heart failure Echocardiogram showed left ventricular ejection fraction of 30 to 35%.  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 Currently euvolemic after diuresis. Patient will need ischemic evaluation after infection improved.    Severe protein calorie malnutrition Dietary on  board.   Hypertension Blood pressure parameters are optimal.    Hypokalemia; replaced.  Hypomagnesemia replaced Recheck in am.  Hypophosphatemia: replaced.    Anemia of chronic disease Continue to monitor. Normocytic.    Severe debility and frailty Therapy evaluations recommending SNF  Severe malnutrition.  Dietary on board to assist with supplementation.    Constipation:  - started him on bowel regimen.  - with bowel movements today.     RN Pressure Injury Documentation: Pressure Injury 06/17/21 Sacrum Stage 3 -  Full thickness tissue loss. Subcutaneous fat may be visible but bone, tendon or muscle are NOT exposed. 2 in vertical by .5 in horizontal--approximately--wound tunneling and pink (Active)  06/17/21 1159  Location: Sacrum  Location Orientation:   Staging: Stage 3 -  Full thickness tissue loss. Subcutaneous fat may be visible but bone, tendon or muscle are NOT exposed.  Wound Description (Comments): 2 in vertical by .5 in horizontal--approximately--wound tunneling and pink  Present on Admission:   Dressing Type Foam - Lift dressing to assess site every shift;Moist to dry 06/26/21 0949  Local wound care.   Malnutrition Type:  Nutrition Problem: Severe Malnutrition Etiology: chronic illness (h/o hernia)   Malnutrition Characteristics:  Signs/Symptoms: severe fat depletion, severe muscle depletion  Nutrition Interventions:  Interventions: Boost Breeze, MVI, Prostat, Refer to RD note for recommendations  Estimated body mass index is 23.96 kg/m as calculated from the following:   Height as of this encounter: 5' 5.98" (1.676 m).   Weight as of this encounter: 67.3 kg.  Code Status: full code.  DVT Prophylaxis:  heparin injection 5,000 Units Start: 06/23/21 1715 SCD's Start: 06/23/21 1621 SCDs Start: 06/17/21 1144   Level of Care: Level of care: Med-Surg Family Communication: none at bedside.   Disposition Plan:     Remains inpatient  appropriate:  IV antibiotics. SNF placement.   Procedures:  anterior cervical decompression and arthrodesis for abscess at levels C4/5.by Dr Christella Noa on 5/17  Consultants:   Neuro surgery.  ID  Antimicrobials:   Anti-infectives (From admission, onward)    Start     Dose/Rate Route Frequency Ordered Stop   06/24/21 0200  ceFAZolin (ANCEF) IVPB 2g/100 mL premix        2 g 200 mL/hr over 30 Minutes Intravenous Every 8 hours 06/21/21 1058     06/23/21 1514  vancomycin (VANCOCIN) powder  Status:  Discontinued          As needed 06/23/21 1515 06/23/21 1524   06/17/21 1230  Ampicillin-Sulbactam (UNASYN) 3 g in sodium chloride 0.9 % 100 mL IVPB        3 g 200 mL/hr over 30 Minutes Intravenous Every 6 hours 06/17/21 1219 06/23/21 2359   06/17/21 0430  Ampicillin-Sulbactam (UNASYN) 3 g in sodium chloride 0.9 % 100 mL IVPB        3 g 200 mL/hr over 30 Minutes Intravenous  Once 06/17/21 0407 06/17/21 0619   06/17/21 0415  metroNIDAZOLE (FLAGYL) IVPB 500 mg  Status:  Discontinued        500 mg 100 mL/hr over 60 Minutes Intravenous Every 12 hours 06/17/21 0407 06/17/21 1226        Medications  Scheduled Meds:  feeding supplement  1 Container Oral TID BM   feeding supplement (PROSource TF)  45 mL Per Tube TID   furosemide  20 mg Oral Daily   heparin injection (subcutaneous)  5,000 Units Subcutaneous Q8H   lidocaine  1 patch Transdermal Q24H   metoprolol tartrate  25 mg Oral BID   multivitamin with minerals  1 tablet Oral Daily   polyethylene glycol  17 g Oral BID   senna-docusate  2 tablet Oral BID   sodium chloride flush  10-40 mL Intracatheter Q12H   sodium chloride flush  3 mL Intravenous Q12H   sodium chloride flush  3 mL Intravenous Q12H   Continuous Infusions:  sodium chloride 250 mL (06/23/21 2000)   0.9 % NaCl with KCl 20 mEq / L Stopped (06/24/21 0059)    ceFAZolin (ANCEF) IV 2 g (06/26/21 0949)   PRN Meds:.acetaminophen **OR** acetaminophen, bisacodyl, diazepam,  hydrALAZINE, magnesium hydroxide, menthol-cetylpyridinium **OR** phenol, oxyCODONE, sodium chloride flush, sodium chloride flush    Subjective:   Logan Nelson was seen and examined today.  No new complaints. Pain well controlled. Patient having bowel movements.   Objective:   Vitals:   06/25/21 1554 06/25/21 2025 06/26/21 0503 06/26/21 0925  BP: 128/84 (!) 149/94 (!) 136/103 131/90  Pulse: 89 91 77 72  Resp: 16 18 18 18   Temp: 98.2 F (36.8 C) 98.2 F (36.8 C) 97.9 F (36.6 C) 98.8 F (37.1 C)  TempSrc: Oral Oral Oral Oral  SpO2: 100% 100% 100% 99%  Weight:  Height:        Intake/Output Summary (Last 24 hours) at 06/26/2021 1439 Last data filed at 06/26/2021 1100 Gross per 24 hour  Intake 777.06 ml  Output 2450 ml  Net -1672.94 ml    Filed Weights   06/18/21 0600  Weight: 67.3 kg     Exam General exam: Appears calm and comfortable  Respiratory system: Clear to auscultation. Respiratory effort normal. Cardiovascular system: S1 & S2 heard, RRR. No JVD no pedal edema.  Gastrointestinal system: Abdomen is nondistended, soft and nontender.  Normal bowel sounds heard. Central nervous system: Alert and oriented. No focal neurological deficits. Extremities: Symmetric 5 x 5 power. Skin: No rashes, lesions or ulcers Psychiatry:  Mood & affect appropriate.       Data Reviewed:  I have personally reviewed following labs and imaging studies   CBC Lab Results  Component Value Date   WBC 11.3 (H) 06/26/2021   RBC 3.08 (L) 06/26/2021   HGB 8.9 (L) 06/26/2021   HCT 29.4 (L) 06/26/2021   MCV 95.5 06/26/2021   MCH 28.9 06/26/2021   PLT 395 06/26/2021   MCHC 30.3 06/26/2021   RDW 17.5 (H) 06/26/2021   LYMPHSABS 2.6 06/26/2021   MONOABS 1.4 (H) 06/26/2021   EOSABS 0.3 06/26/2021   BASOSABS 0.0 [id number]     Last metabolic panel Lab Results  Component Value Date   NA 142 06/26/2021   K 3.5 06/26/2021   CL 110 06/26/2021   CO2 28 06/26/2021   BUN 14  06/26/2021   CREATININE 0.53 (L) 06/26/2021   GLUCOSE 79 06/26/2021   GFRNONAA >60 06/26/2021   GFRAA >60 01/23/2019   CALCIUM 8.2 (L) 06/26/2021   PHOS 3.3 06/23/2021   PROT 5.6 (L) 06/23/2021   ALBUMIN 2.3 (L) 06/23/2021   LABGLOB 2.6 04/13/2021   AGRATIO 1.0 04/13/2021   BILITOT 0.8 06/23/2021   ALKPHOS 72 06/23/2021   AST 20 06/23/2021   ALT 13 06/23/2021   ANIONGAP 4 (L) 06/26/2021    CBG (last 3)  No results for input(s): GLUCAP in the last 72 hours.    Coagulation Profile: No results for input(s): INR, PROTIME in the last 168 hours.   Radiology Studies: [name] EKG SITE RITE  Result Date: 06/24/2021 If Site Rite image not attached, placement could not be confirmed due to current cardiac rhythm.      Hosie Poisson M.D. [facility] 06/26/2021, 2:39 PM  Available via Epic secure chat 7am-7pm After 7 pm, please refer to night coverage provider listed on amion.

## 2021-06-27 ENCOUNTER — Inpatient Hospital Stay (HOSPITAL_COMMUNITY): Payer: Medicaid Other

## 2021-06-27 DIAGNOSIS — B9561 Methicillin susceptible Staphylococcus aureus infection as the cause of diseases classified elsewhere: Secondary | ICD-10-CM | POA: Diagnosis not present

## 2021-06-27 DIAGNOSIS — I5021 Acute systolic (congestive) heart failure: Secondary | ICD-10-CM | POA: Diagnosis not present

## 2021-06-27 DIAGNOSIS — D638 Anemia in other chronic diseases classified elsewhere: Secondary | ICD-10-CM | POA: Diagnosis not present

## 2021-06-27 DIAGNOSIS — R601 Generalized edema: Secondary | ICD-10-CM | POA: Diagnosis not present

## 2021-06-27 DIAGNOSIS — G062 Extradural and subdural abscess, unspecified: Secondary | ICD-10-CM | POA: Diagnosis not present

## 2021-06-27 DIAGNOSIS — R531 Weakness: Secondary | ICD-10-CM | POA: Diagnosis not present

## 2021-06-27 DIAGNOSIS — M19011 Primary osteoarthritis, right shoulder: Secondary | ICD-10-CM | POA: Diagnosis not present

## 2021-06-27 DIAGNOSIS — R7881 Bacteremia: Secondary | ICD-10-CM | POA: Diagnosis not present

## 2021-06-27 LAB — CBC WITH DIFFERENTIAL/PLATELET
Abs Immature Granulocytes: 0.03 10*3/uL (ref 0.00–0.07)
Basophils Absolute: 0 10*3/uL (ref 0.0–0.1)
Basophils Relative: 0 %
Eosinophils Absolute: 0.2 10*3/uL (ref 0.0–0.5)
Eosinophils Relative: 2 %
HCT: 28.8 % — ABNORMAL LOW (ref 39.0–52.0)
Hemoglobin: 8.8 g/dL — ABNORMAL LOW (ref 13.0–17.0)
Immature Granulocytes: 0 %
Lymphocytes Relative: 17 %
Lymphs Abs: 1.6 10*3/uL (ref 0.7–4.0)
MCH: 29.1 pg (ref 26.0–34.0)
MCHC: 30.6 g/dL (ref 30.0–36.0)
MCV: 95.4 fL (ref 80.0–100.0)
Monocytes Absolute: 1.2 10*3/uL — ABNORMAL HIGH (ref 0.1–1.0)
Monocytes Relative: 12 %
Neutro Abs: 6.6 10*3/uL (ref 1.7–7.7)
Neutrophils Relative %: 69 %
Platelets: 373 10*3/uL (ref 150–400)
RBC: 3.02 MIL/uL — ABNORMAL LOW (ref 4.22–5.81)
RDW: 17.9 % — ABNORMAL HIGH (ref 11.5–15.5)
WBC: 9.7 10*3/uL (ref 4.0–10.5)
nRBC: 0 % (ref 0.0–0.2)

## 2021-06-27 LAB — BASIC METABOLIC PANEL
Anion gap: 4 — ABNORMAL LOW (ref 5–15)
BUN: 10 mg/dL (ref 6–20)
CO2: 27 mmol/L (ref 22–32)
Calcium: 8.2 mg/dL — ABNORMAL LOW (ref 8.9–10.3)
Chloride: 110 mmol/L (ref 98–111)
Creatinine, Ser: 0.4 mg/dL — ABNORMAL LOW (ref 0.61–1.24)
GFR, Estimated: >60 mL/min (ref >60)
Glucose, Bld: 119 mg/dL — ABNORMAL HIGH (ref 70–99)
Potassium: 3.3 mmol/L — ABNORMAL LOW (ref 3.5–5.1)
Sodium: 141 mmol/L (ref 135–145)

## 2021-06-27 LAB — MAGNESIUM: Magnesium: 1.6 mg/dL — ABNORMAL LOW (ref 1.7–2.4)

## 2021-06-27 IMAGING — CT CT SHOULDER*R* W/O CM
3 series · 11 of 33 positions shown, 13 images · non-contrast
Comparison: CT [DATE]

CLINICAL DATA: Septic arthritis suspected, shoulder, xray done

EXAM:
CT OF THE UPPER RIGHT EXTREMITY WITHOUT CONTRAST
TECHNIQUE: Multidetector CT imaging of the upper right extremity was performed
according to the standard protocol.
RADIATION DOSE REDUCTION: This exam was performed according to the
departmental dose-optimization program which includes automated
exposure control, adjustment of the mA and/or kV according to
patient size and/or use of iterative reconstruction technique.

[Series 4: (id) st ax · axial · 0.57mm/px · z∈[-552,-454]mm · 3 of 81 slices shown, 4 images]
[im 19/81  soft-tissue]
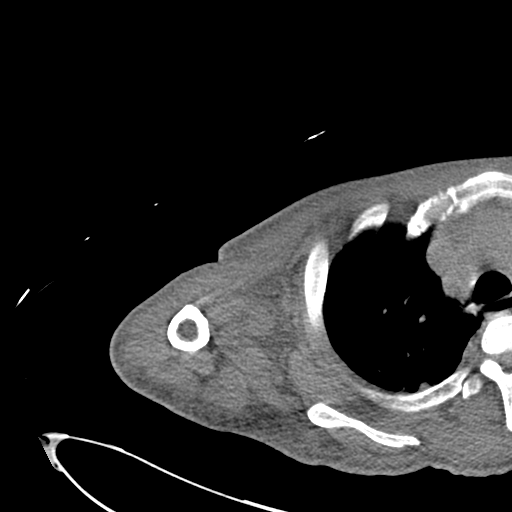
[im 19/81  bone]
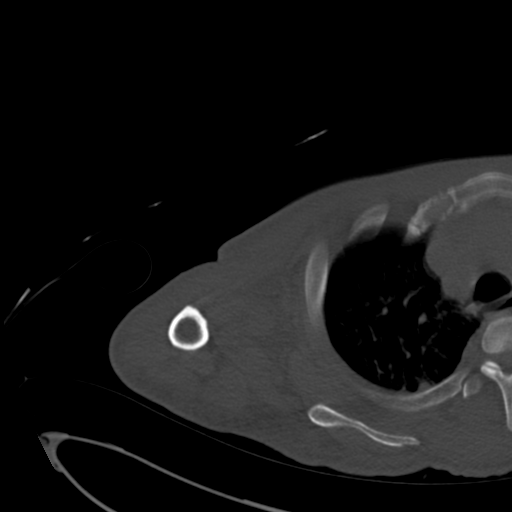
[im 44/81  bone]
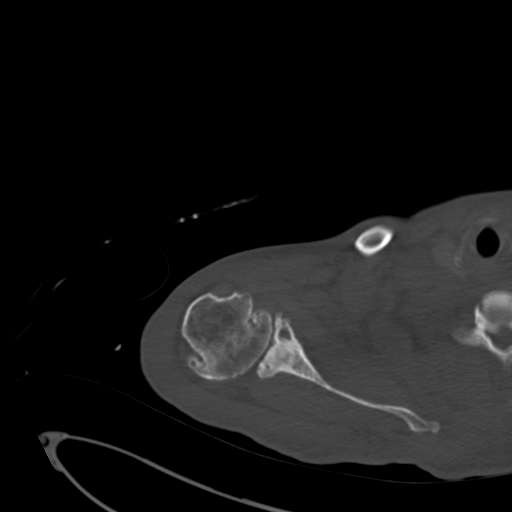
[im 68/81  bone]
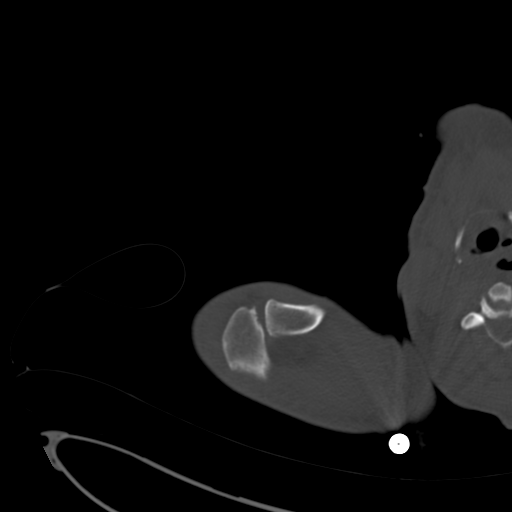

[Series 6: (id) st cor · coronal · 0.31mm/px · 3 of 101 slices shown]
[im 21/101  bone]
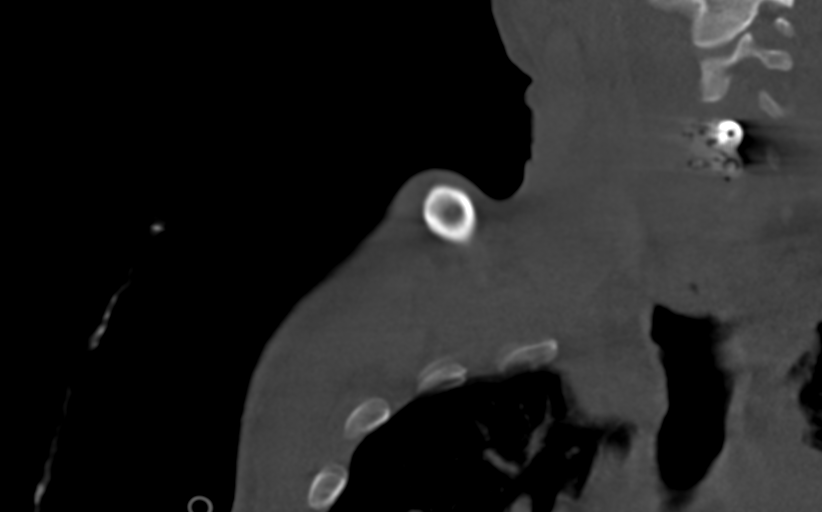
[im 41/101  bone]
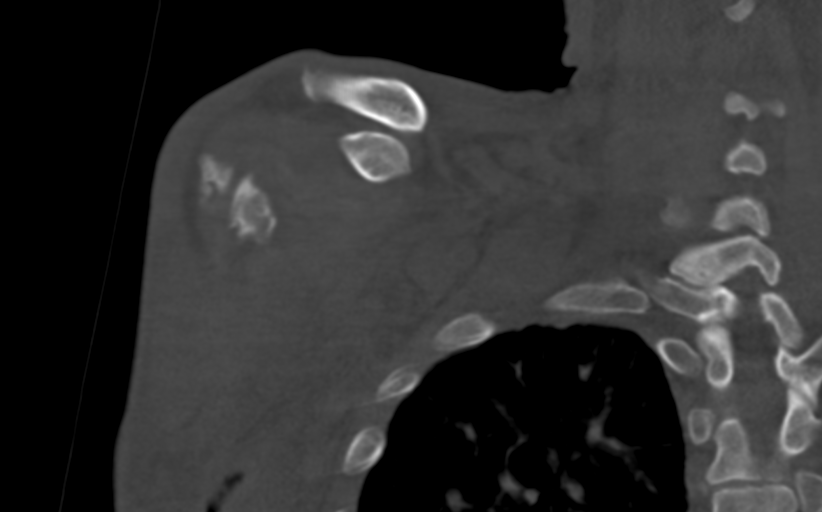
[im 61/101  bone]
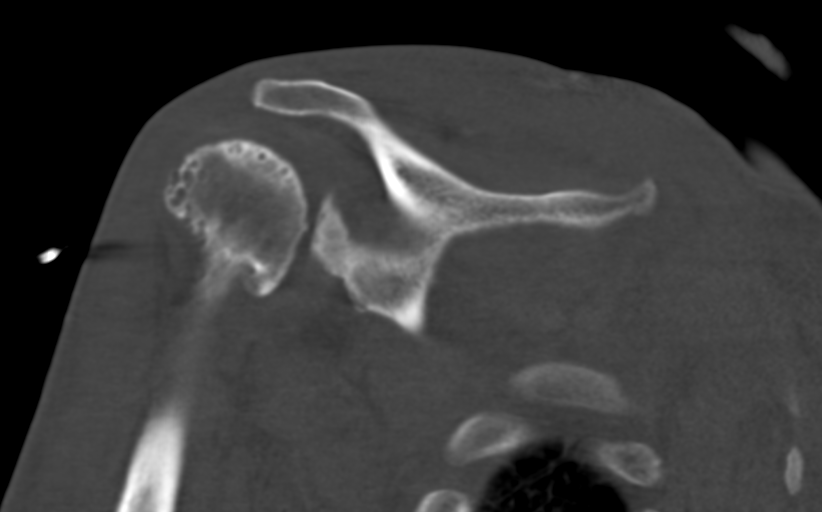

[Series 8: (id) st sag · sagittal · 0.31mm/px · 5 of 151 slices shown, 6 images]
[im 51/151  bone]
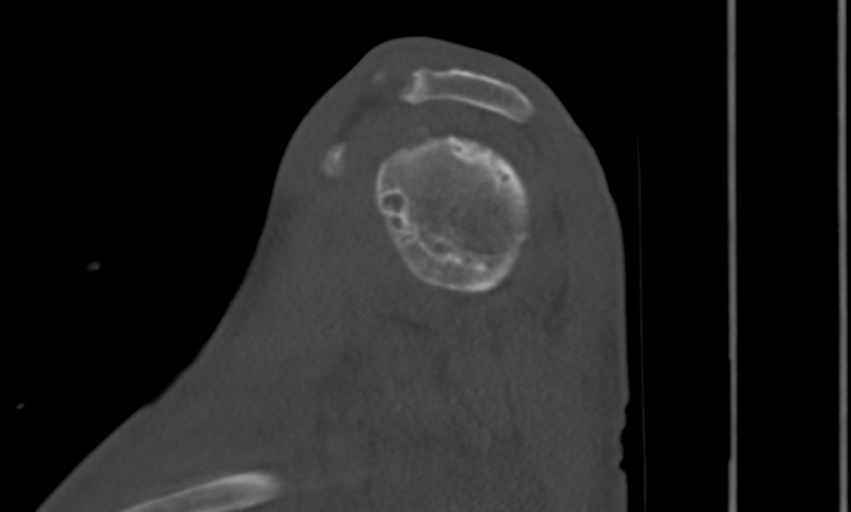
[im 63/151  bone]
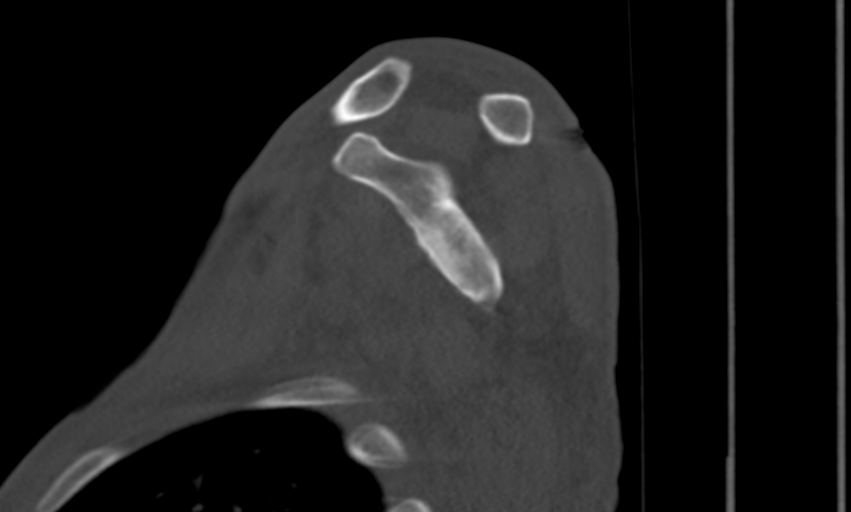
[im 76/151  soft-tissue]
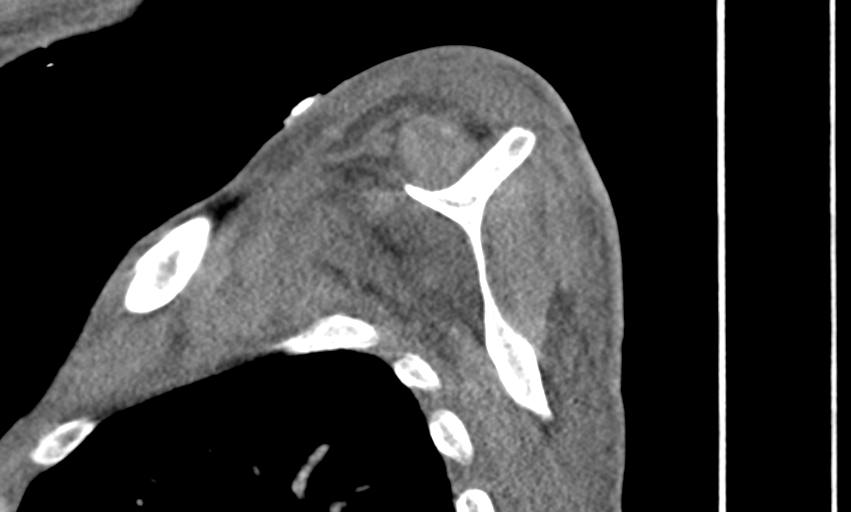
[im 76/151  bone]
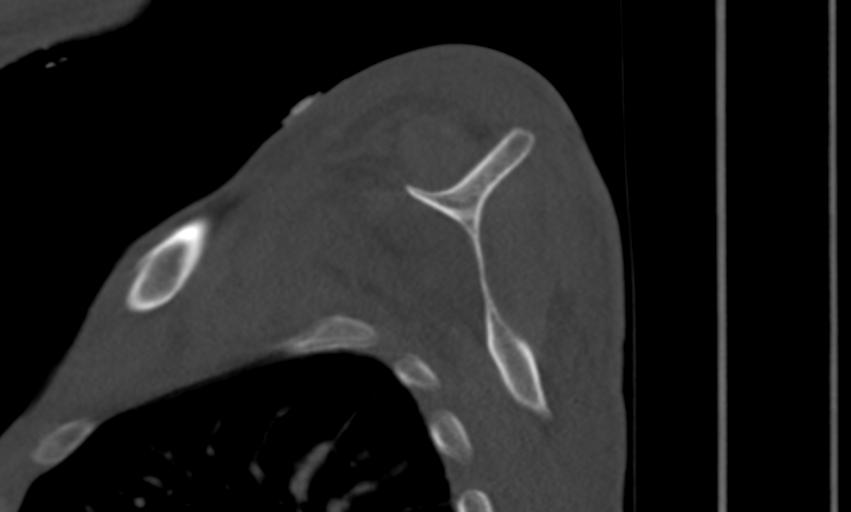
[im 88/151  bone]
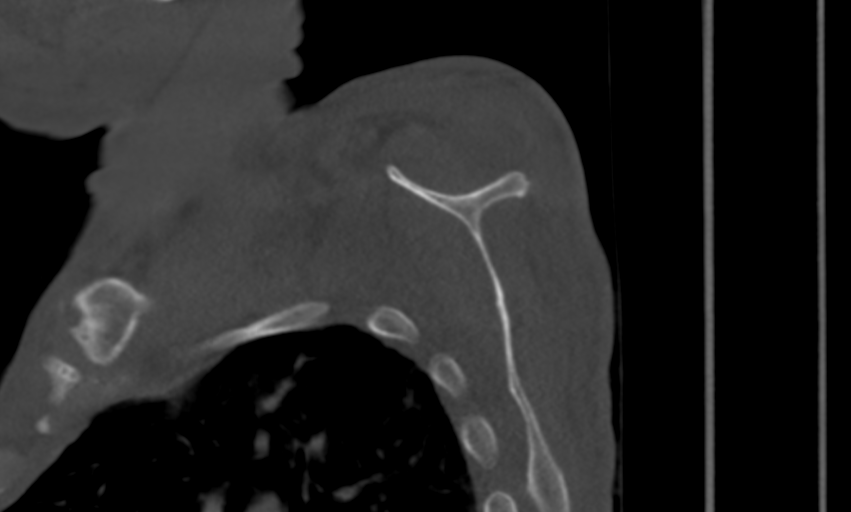
[im 101/151  bone]
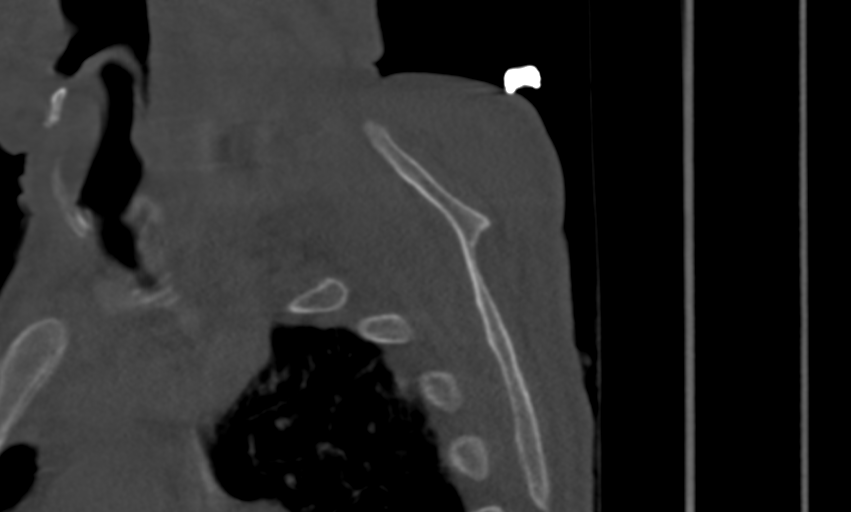

[11 of 33 positions shown; findings below may reference images not displayed]

FINDINGS: Bones/Joint/Cartilage

No acute fracture. No dislocation. Severe glenohumeral joint
osteoarthritis manifested by joint space narrowing, subchondral
sclerosis/cystic change, and marginal osteophyte formation. There
are prominent subchondral cystic changes within the inferior glenoid
and superior humeral head. Mild superior glenoid retroversion.
Suspect small glenohumeral joint effusion.

Moderate degenerative changes of the acromioclavicular joint. No
large subacromial-subdeltoid bursal fluid collection. Remaining
visualized osseous structures appear grossly intact.

Ligaments

Suboptimally assessed by CT.

Muscles and Tendons

Preserved muscle bulk of the rotator cuff musculature without
significant atrophy or fatty infiltration. Rotator cuff tendons
appear grossly intact within the limitations of CT.

Soft tissues

Generalized anasarca. No evidence of a organized fluid collection
about the shoulder.
IMPRESSION: 1. Severe glenohumeral joint osteoarthritis. Suspect small
glenohumeral joint effusion, presumably reactive/degenerative.
Septic arthritis is unable to be excluded by imaging.
2. Moderate degenerative changes of the acromioclavicular joint.
3. Generalized anasarca.

## 2021-06-27 MED ORDER — POTASSIUM CHLORIDE CRYS ER 20 MEQ PO TBCR
40.0000 meq | EXTENDED_RELEASE_TABLET | Freq: Two times a day (BID) | ORAL | Status: DC
  Administered 2021-06-27 – 2021-07-05 (×16): 40 meq via ORAL
  Filled 2021-06-27 (×16): qty 2

## 2021-06-27 NOTE — Progress Notes (Signed)
Patient ID: Logan Nelson, male   DOB: May 21, 1965, 56 y.o.   MRN: [id number] BP (!) 143/97 (BP Location: Right Arm)   Pulse 94   Temp 98.9 F (37.2 C) (Oral)   Resp 18   Ht 5' 5.98" (1.676 m)   Wt 67.3 kg   SpO2 96%   BMI 23.96 kg/m  No changes in exam. Will continue to follow

## 2021-06-27 NOTE — Progress Notes (Addendum)
[facility]                                                                               Logan Nelson, is a 56 y.o. male, DOB - [birth year], [id number]:[id number] Admit date - 06/16/2021    Outpatient Primary MD for the patient is [name], MD  LOS - 10  days    Brief summary   56 year old gentleman with history of hypertension, anasarca, recently admitted for incarcerated abdominal hernia with primary repair  Who left [place] before he was placed to a rehab.  He subsequently again admitted from 4/4-5/11 with severe debility and malnutrition and failure to thrive.  While he was in the hospital, developed MSSA bacteremia and sepsis, back pain. Was under treatment but left AMA 5/10 just to come back due to profound weakness and now decided he will complete treatment.   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  Source from ? sacral decubitus ulcer Blood cultures from 06/14/2021 show MSSA and repeat cultures have been negative so far.   TTE is negative for age vegetation.  Will check with cardiology for TEE.  Patient remains on Ancef, ID on board. Recommendations to completed 6 weeks of ANCEF followed by suppression with cefadroxil.  Intra operative Cultures growing staph aureus sensitive to oxacillin.  Neurosurgery on board  and pt underwent anterior cervical decompression and abscess evacuation with placement of screws.  Anticipate prolonged IV antibiotics up to 8 weeks. Pt remains afebrile and leukocytosis is improving.  Unfortunately pt is difficult to place in SNF. Will need to stay inpatient for the course of IV antibiotics.     Acute systolic congestive heart failure Echocardiogram showed left ventricular ejection fraction of 30 to 35%.  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 Currently euvolemic after diuresis. Patient will need ischemic evaluation after infection  improved.    Severe protein calorie malnutrition Dietary on board.   Hypertension Blood pressure parameters are well controlled.   Right shoulder pain, unable to lift it.  Tenderness  Will get CT shoulder to check for septic arthritis.    Hypokalemia; replaced.  Hypomagnesemia replaced Recheck in am.  Hypophosphatemia: replaced.    Anemia of chronic disease Continue to monitor. Normocytic.    Severe debility and frailty Therapy evaluations recommending SNF  Severe malnutrition.  Dietary on board to assist with supplementation.    Constipation:  - started him on bowel regimen.  - with bowel movements today.     RN Pressure Injury Documentation: Pressure Injury 06/17/21 Sacrum Stage 3 -  Full thickness tissue loss. Subcutaneous fat may be visible but bone, tendon or muscle are NOT exposed. 2 in vertical by .5 in horizontal--approximately--wound tunneling and pink (Active)  06/17/21 1159  Location: Sacrum  Location Orientation:   Staging: Stage 3 -  Full thickness tissue loss. Subcutaneous fat may be visible but bone, tendon or muscle are NOT exposed.  Wound Description (Comments): 2 in vertical by .5 in horizontal--approximately--wound tunneling and pink  Present on Admission:   Dressing Type Foam - Lift dressing to assess site every shift;Gauze (Comment);Moist to dry 06/27/21  1348  Local wound care.   Malnutrition Type:  Nutrition Problem: Severe Malnutrition Etiology: chronic illness (h/o hernia)   Malnutrition Characteristics:  Signs/Symptoms: severe fat depletion, severe muscle depletion   Nutrition Interventions:  Interventions: Boost Breeze, MVI, Prostat, Refer to RD note for recommendations  Estimated body mass index is 23.96 kg/m as calculated from the following:   Height as of this encounter: 5' 5.98" (1.676 m).   Weight as of this encounter: 67.3 kg.  Code Status: full code.  DVT Prophylaxis:  heparin injection 5,000 Units Start:  06/23/21 1715 SCD's Start: 06/23/21 1621 SCDs Start: 06/17/21 1144   Level of Care: Level of care: Med-Surg Family Communication: none at bedside.   Disposition Plan:     Remains inpatient appropriate:  IV antibiotics. SNF placement.   Procedures:  anterior cervical decompression and arthrodesis for abscess at levels C4/5.by Dr Christella Noa on 5/17  Consultants:   Neuro surgery.  ID  Antimicrobials:   Anti-infectives (From admission, onward)    Start     Dose/Rate Route Frequency Ordered Stop   06/24/21 0200  ceFAZolin (ANCEF) IVPB 2g/100 mL premix        2 g 200 mL/hr over 30 Minutes Intravenous Every 8 hours 06/21/21 1058     06/23/21 1514  vancomycin (VANCOCIN) powder  Status:  Discontinued          As needed 06/23/21 1515 06/23/21 1524   06/17/21 1230  Ampicillin-Sulbactam (UNASYN) 3 g in sodium chloride 0.9 % 100 mL IVPB        3 g 200 mL/hr over 30 Minutes Intravenous Every 6 hours 06/17/21 1219 06/23/21 2359   06/17/21 0430  Ampicillin-Sulbactam (UNASYN) 3 g in sodium chloride 0.9 % 100 mL IVPB        3 g 200 mL/hr over 30 Minutes Intravenous  Once 06/17/21 0407 06/17/21 0619   06/17/21 0415  metroNIDAZOLE (FLAGYL) IVPB 500 mg  Status:  Discontinued        500 mg 100 mL/hr over 60 Minutes Intravenous Every 12 hours 06/17/21 0407 06/17/21 1226        Medications  Scheduled Meds:  feeding supplement  1 Container Oral TID BM   furosemide  20 mg Oral Daily   heparin injection (subcutaneous)  5,000 Units Subcutaneous Q8H   lidocaine  1 patch Transdermal Q24H   metoprolol tartrate  25 mg Oral BID   multivitamin with minerals  1 tablet Oral Daily   potassium chloride  40 mEq Oral BID   senna-docusate  2 tablet Oral BID   sodium chloride flush  10-40 mL Intracatheter Q12H   sodium chloride flush  3 mL Intravenous Q12H   sodium chloride flush  3 mL Intravenous Q12H   Continuous Infusions:  sodium chloride 250 mL (06/23/21 2000)    ceFAZolin (ANCEF) IV 2 g (06/27/21  0933)   PRN Meds:.acetaminophen **OR** acetaminophen, bisacodyl, diazepam, hydrALAZINE, magnesium hydroxide, menthol-cetylpyridinium **OR** phenol, oxyCODONE, polyethylene glycol, sodium chloride flush, sodium chloride flush    Subjective:   Logan Nelson was seen and examined today.  Right shoulder pain.   Objective:   Vitals:   06/26/21 2006 06/27/21 0434 06/27/21 0759 06/27/21 0932  BP: (!) 145/82 (!) 139/102 (!) 138/102 (!) 143/97  Pulse: 85 81 90 94  Resp: 18 18 18    Temp: 98.3 F (36.8 C) 98 F (36.7 C) 98.9 F (37.2 C)   TempSrc: Oral Oral Oral   SpO2: 100% 100% 96%   Weight:  Height:        Intake/Output Summary (Last 24 hours) at 06/27/2021 1518 Last data filed at 06/27/2021 1350 Gross per 24 hour  Intake 1767.7 ml  Output 1400 ml  Net 367.7 ml    Filed Weights   06/18/21 0600  Weight: 67.3 kg     Exam General exam: Appears calm and comfortable  Respiratory system: Clear to auscultation. Respiratory effort normal. Cardiovascular system: S1 & S2 heard, RRR. No JVDNo pedal edema. Gastrointestinal system: Abdomen is nondistended, soft and nontender.  Normal bowel sounds heard. Central nervous system: Alert and oriented. No focal neurological deficits. Extremities: right upper extremity swelling and tenderness in the shoulder.  Skin: No rashes, lesions or ulcers Psychiatry: Mood & affect appropriate.        Data Reviewed:  I have personally reviewed following labs and imaging studies   CBC Lab Results  Component Value Date   WBC 9.7 06/27/2021   RBC 3.02 (L) 06/27/2021   HGB 8.8 (L) 06/27/2021   HCT 28.8 (L) 06/27/2021   MCV 95.4 06/27/2021   MCH 29.1 06/27/2021   PLT 373 06/27/2021   MCHC 30.6 06/27/2021   RDW 17.9 (H) 06/27/2021   LYMPHSABS 1.6 06/27/2021   MONOABS 1.2 (H) 06/27/2021   EOSABS 0.2 06/27/2021   BASOSABS 0.0 [id number]     Last metabolic panel Lab Results  Component Value Date   NA 141 06/27/2021   K 3.3 (L)  06/27/2021   CL 110 06/27/2021   CO2 27 06/27/2021   BUN 10 06/27/2021   CREATININE 0.40 (L) 06/27/2021   GLUCOSE 119 (H) 06/27/2021   GFRNONAA >60 06/27/2021   GFRAA >60 01/23/2019   CALCIUM 8.2 (L) 06/27/2021   PHOS 3.3 06/23/2021   PROT 5.6 (L) 06/23/2021   ALBUMIN 2.3 (L) 06/23/2021   LABGLOB 2.6 04/13/2021   AGRATIO 1.0 04/13/2021   BILITOT 0.8 06/23/2021   ALKPHOS 72 06/23/2021   AST 20 06/23/2021   ALT 13 06/23/2021   ANIONGAP 4 (L) 06/27/2021    CBG (last 3)  No results for input(s): GLUCAP in the last 72 hours.    Coagulation Profile: No results for input(s): INR, PROTIME in the last 168 hours.   Radiology Studies: No results found.     Hosie Poisson M.D. [facility] 06/27/2021, 3:18 PM  Available via Epic secure chat 7am-7pm After 7 pm, please refer to night coverage provider listed on amion.

## 2021-06-28 ENCOUNTER — Inpatient Hospital Stay (HOSPITAL_COMMUNITY): Payer: Medicaid Other

## 2021-06-28 ENCOUNTER — Encounter (HOSPITAL_COMMUNITY): Payer: Self-pay | Admitting: Neurosurgery

## 2021-06-28 DIAGNOSIS — R609 Edema, unspecified: Secondary | ICD-10-CM

## 2021-06-28 DIAGNOSIS — M00011 Staphylococcal arthritis, right shoulder: Secondary | ICD-10-CM | POA: Diagnosis not present

## 2021-06-28 DIAGNOSIS — R188 Other ascites: Secondary | ICD-10-CM | POA: Diagnosis not present

## 2021-06-28 DIAGNOSIS — L89153 Pressure ulcer of sacral region, stage 3: Secondary | ICD-10-CM | POA: Diagnosis not present

## 2021-06-28 DIAGNOSIS — G061 Intraspinal abscess and granuloma: Secondary | ICD-10-CM | POA: Diagnosis not present

## 2021-06-28 DIAGNOSIS — I5021 Acute systolic (congestive) heart failure: Secondary | ICD-10-CM | POA: Diagnosis not present

## 2021-06-28 DIAGNOSIS — L89302 Pressure ulcer of unspecified buttock, stage 2: Secondary | ICD-10-CM | POA: Diagnosis not present

## 2021-06-28 DIAGNOSIS — R7881 Bacteremia: Secondary | ICD-10-CM | POA: Diagnosis not present

## 2021-06-28 DIAGNOSIS — D638 Anemia in other chronic diseases classified elsewhere: Secondary | ICD-10-CM | POA: Diagnosis not present

## 2021-06-28 DIAGNOSIS — M009 Pyogenic arthritis, unspecified: Secondary | ICD-10-CM | POA: Diagnosis not present

## 2021-06-28 DIAGNOSIS — B9561 Methicillin susceptible Staphylococcus aureus infection as the cause of diseases classified elsewhere: Secondary | ICD-10-CM | POA: Diagnosis not present

## 2021-06-28 DIAGNOSIS — G062 Extradural and subdural abscess, unspecified: Secondary | ICD-10-CM | POA: Diagnosis not present

## 2021-06-28 DIAGNOSIS — A4101 Sepsis due to Methicillin susceptible Staphylococcus aureus: Secondary | ICD-10-CM | POA: Diagnosis not present

## 2021-06-28 DIAGNOSIS — J9811 Atelectasis: Secondary | ICD-10-CM | POA: Diagnosis not present

## 2021-06-28 DIAGNOSIS — E43 Unspecified severe protein-calorie malnutrition: Secondary | ICD-10-CM | POA: Diagnosis not present

## 2021-06-28 DIAGNOSIS — E1169 Type 2 diabetes mellitus with other specified complication: Secondary | ICD-10-CM | POA: Diagnosis not present

## 2021-06-28 DIAGNOSIS — I110 Hypertensive heart disease with heart failure: Secondary | ICD-10-CM | POA: Diagnosis not present

## 2021-06-28 DIAGNOSIS — J90 Pleural effusion, not elsewhere classified: Secondary | ICD-10-CM | POA: Diagnosis not present

## 2021-06-28 DIAGNOSIS — J9601 Acute respiratory failure with hypoxia: Secondary | ICD-10-CM | POA: Diagnosis not present

## 2021-06-28 DIAGNOSIS — J811 Chronic pulmonary edema: Secondary | ICD-10-CM | POA: Diagnosis not present

## 2021-06-28 DIAGNOSIS — R0602 Shortness of breath: Secondary | ICD-10-CM | POA: Diagnosis not present

## 2021-06-28 DIAGNOSIS — I248 Other forms of acute ischemic heart disease: Secondary | ICD-10-CM | POA: Diagnosis not present

## 2021-06-28 DIAGNOSIS — R531 Weakness: Secondary | ICD-10-CM | POA: Diagnosis not present

## 2021-06-28 LAB — BRAIN NATRIURETIC PEPTIDE: B Natriuretic Peptide: >4500 pg/mL — ABNORMAL HIGH (ref 0.0–100.0)

## 2021-06-28 LAB — AEROBIC/ANAEROBIC CULTURE W GRAM STAIN (SURGICAL/DEEP WOUND): Gram Stain: NONE SEEN

## 2021-06-28 LAB — BASIC METABOLIC PANEL
Anion gap: 6 (ref 5–15)
BUN: 11 mg/dL (ref 6–20)
CO2: 26 mmol/L (ref 22–32)
Calcium: 8.9 mg/dL (ref 8.9–10.3)
Chloride: 106 mmol/L (ref 98–111)
Creatinine, Ser: 0.45 mg/dL — ABNORMAL LOW (ref 0.61–1.24)
GFR, Estimated: >60 mL/min (ref >60)
Glucose, Bld: 120 mg/dL — ABNORMAL HIGH (ref 70–99)
Potassium: 4 mmol/L (ref 3.5–5.1)
Sodium: 138 mmol/L (ref 135–145)

## 2021-06-28 LAB — TROPONIN I (HIGH SENSITIVITY)
Troponin I (High Sensitivity): 57 ng/L — ABNORMAL HIGH (ref <18)
Troponin I (High Sensitivity): 55 ng/L — ABNORMAL HIGH (ref <18)

## 2021-06-28 IMAGING — CT CT ANGIO CHEST
2 of 6 series · 18 of 36 positions shown · IV contrast (agent unspecified)
Comparison: [DATE]

CLINICAL DATA: Pulmonary embolism suspected with high probability.

EXAM:
CT ANGIOGRAPHY CHEST WITH CONTRAST
TECHNIQUE: Multidetector CT imaging of the chest was performed using the
standard protocol during bolus administration of intravenous
contrast. Multiplanar CT image reconstructions and MIPs were
obtained to evaluate the vascular anatomy.

[Series 7: pe thins · axial · 0.72mm/px · z∈[+1204,+1475]mm · 17 of 431 slices shown]
[im 22/431  lung]
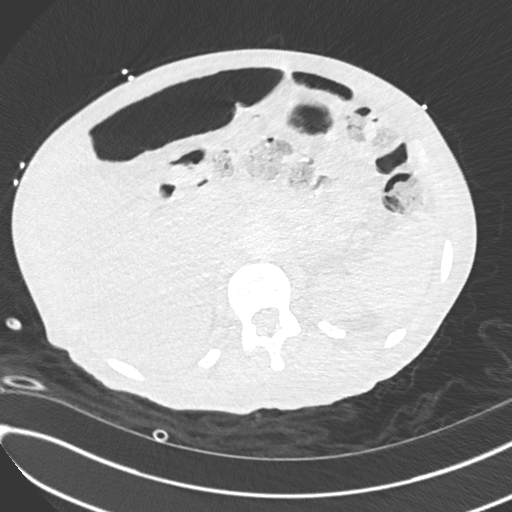
[im 44/431  mediastinal]
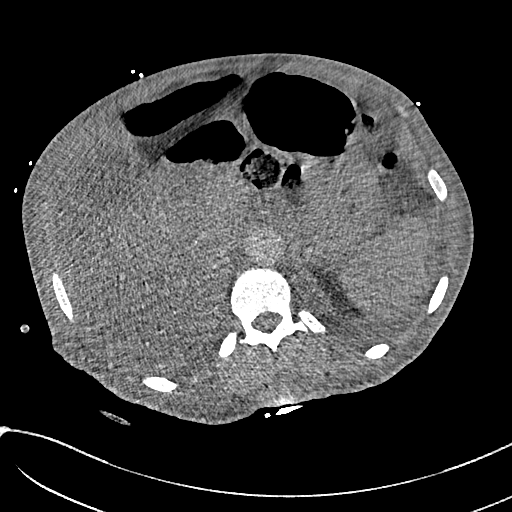
[im 65/431  lung]
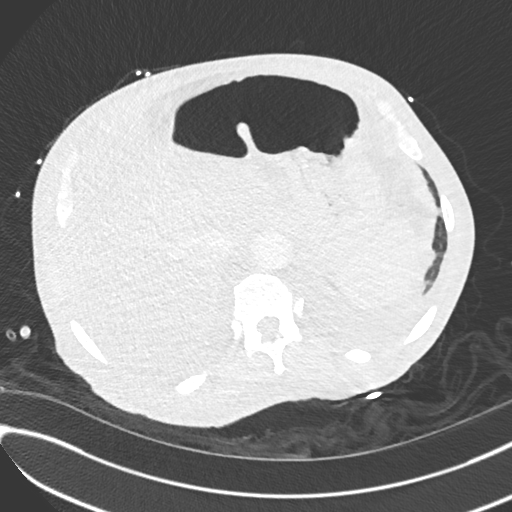
[im 87/431  mediastinal]
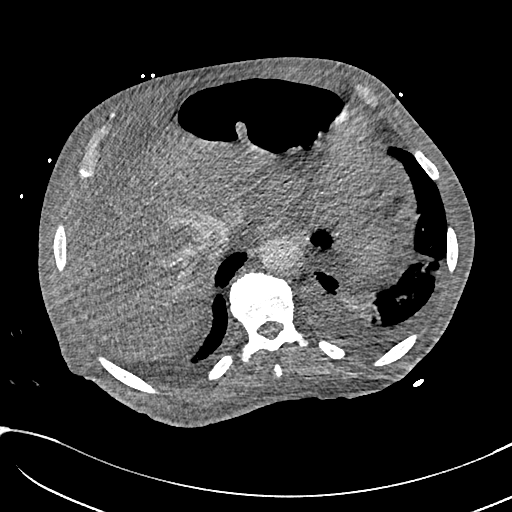
[im 130/431  lung]
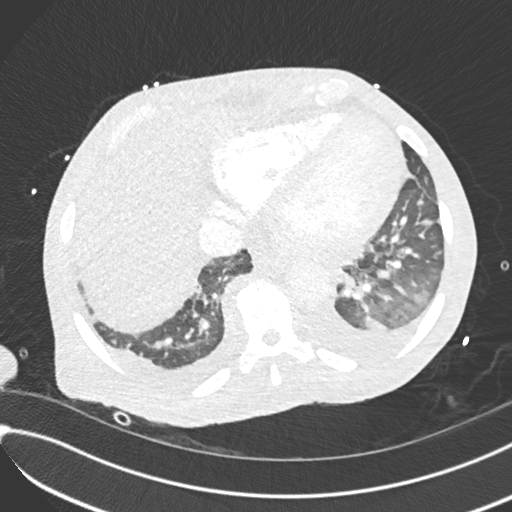
[im 151/431  mediastinal]
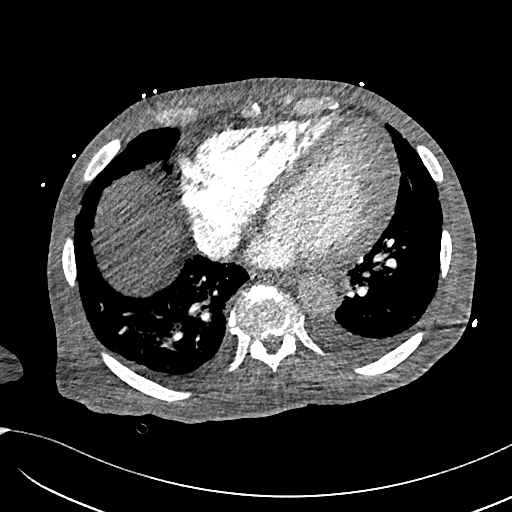
[im 173/431  lung]
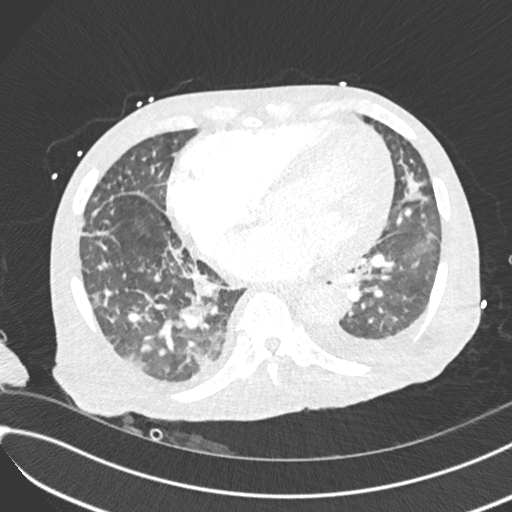
[im 194/431  mediastinal]
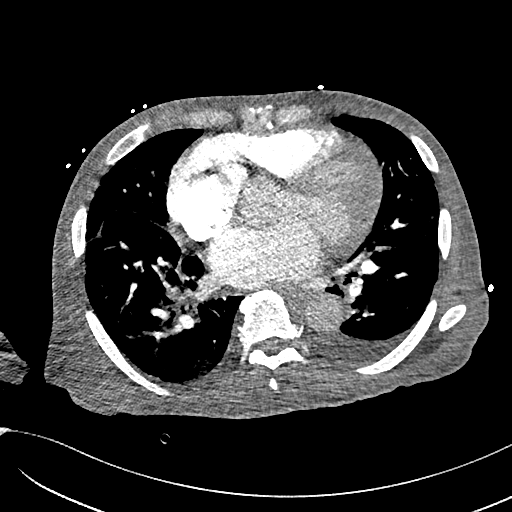
[im 216/431  lung]
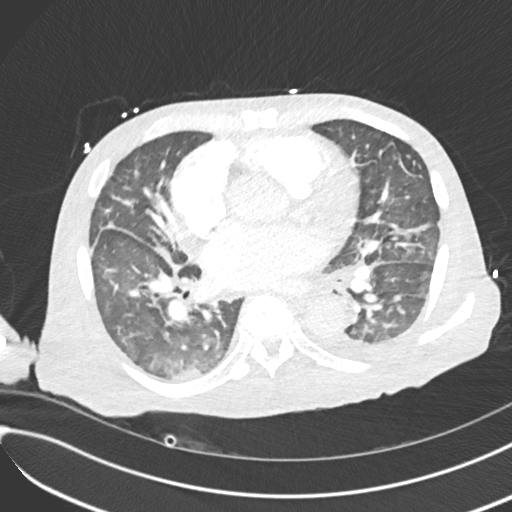
[im 237/431  mediastinal]
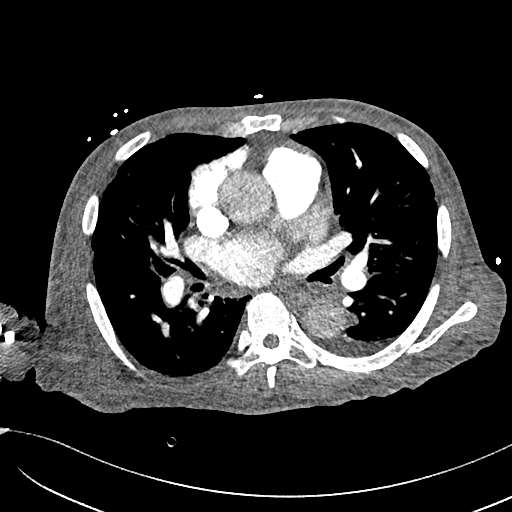
[im 259/431  lung]
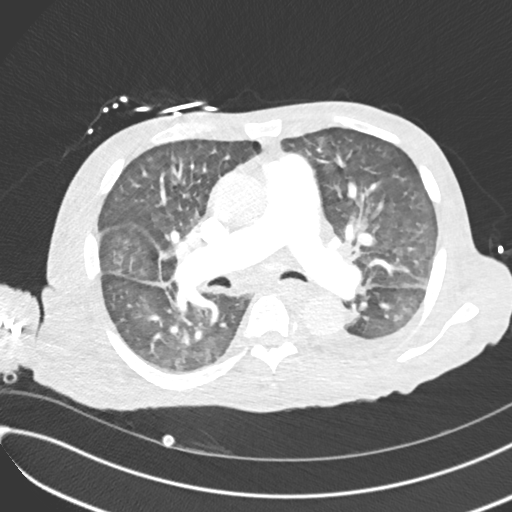
[im 280/431  mediastinal]
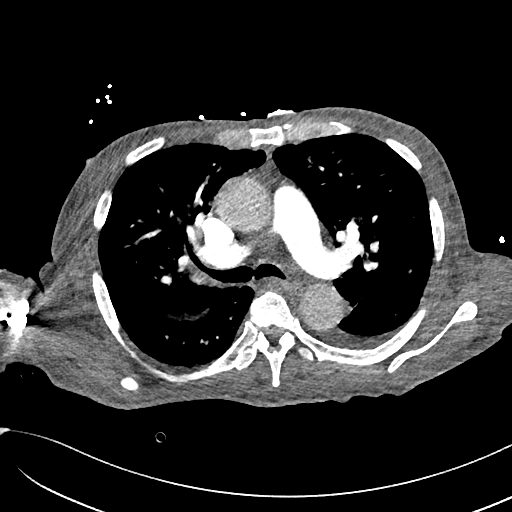
[im 302/431  lung]
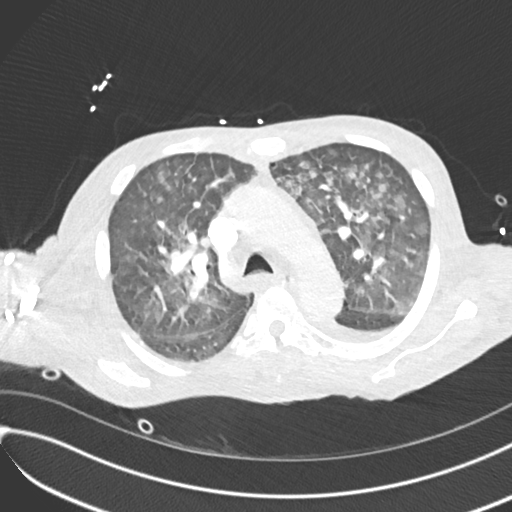
[im 345/431  mediastinal]
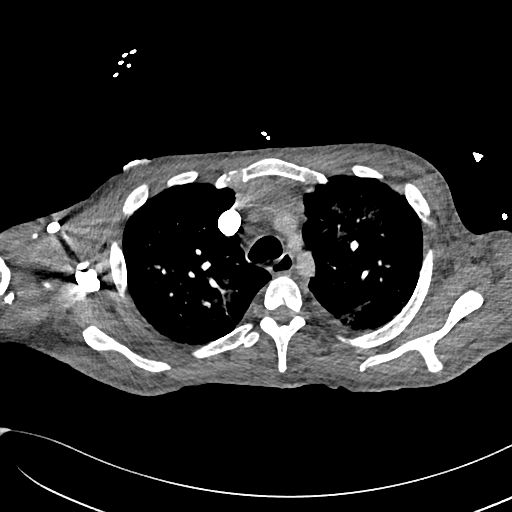
[im 366/431  lung]
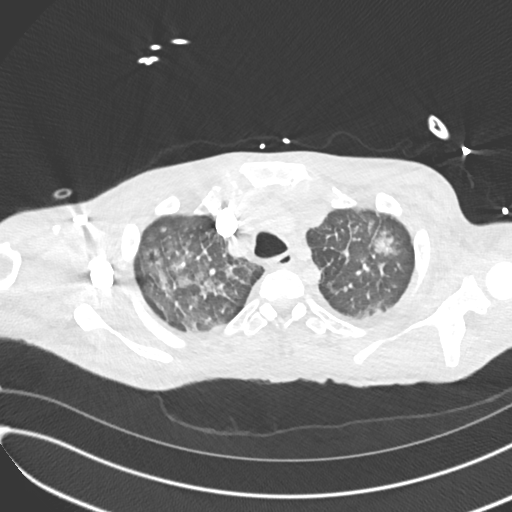
[im 388/431  mediastinal]
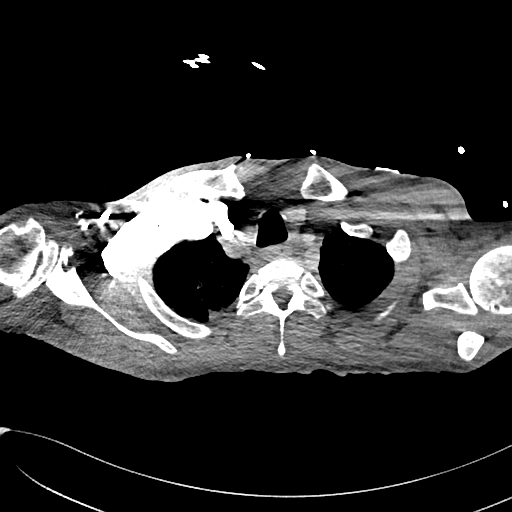
[im 409/431  lung]
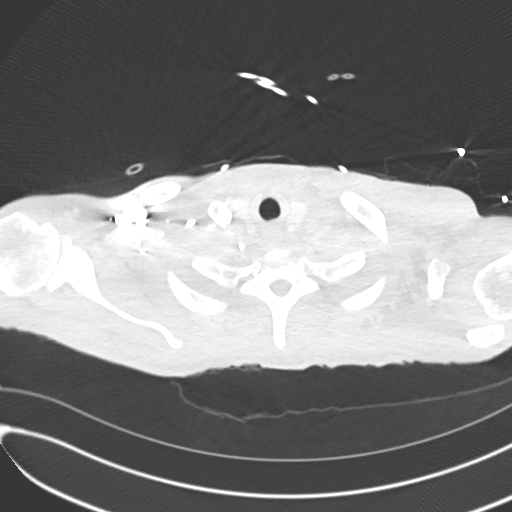

[Series 8: pe 2mm cor · coronal · 0.59mm/px · 1 of 126 slices shown]
[im 63/126  mediastinal]
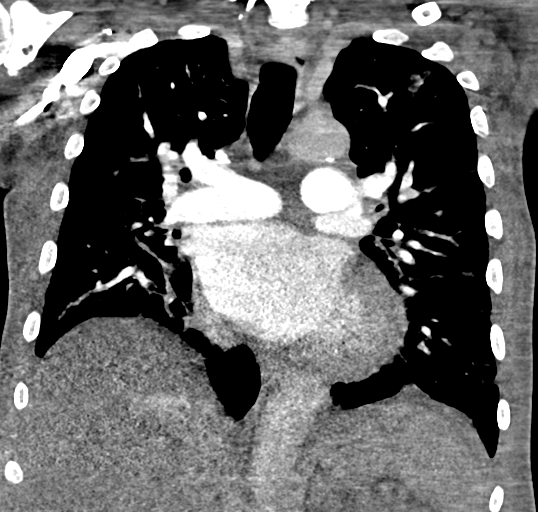

[18 of 36 positions shown; findings below may reference images not displayed]

RADIATION DOSE REDUCTION: This exam was performed according to the
departmental dose-optimization program which includes automated
exposure control, adjustment of the mA and/or kV according to
patient size and/or use of iterative reconstruction technique.

CONTRAST:  80mL OMNIPAQUE IOHEXOL 350 MG/ML SOLN
FINDINGS: Cardiovascular: Satisfactory opacification of the pulmonary arteries
to the segmental level. No evidence of pulmonary embolism. Diffuse
cardiac enlargement. No pericardial effusion.

Mediastinum/Nodes: Esophagus is decompressed. No significant
lymphadenopathy. Thyroid gland is unremarkable. Diffuse soft tissue
edema in the chest wall and mediastinum.

Lungs/Pleura: Small bilateral pleural effusions. Atelectasis in the
lung bases. Diffuse patchy and confluent areas of airspace disease
throughout both lungs with interstitial thickening. Changes have
developed since the previous study and may represent edema or
multifocal pneumonia. Diffuse peribronchial thickening.

Upper Abdomen: Diffuse soft tissue edema suggesting anasarca. No
focal acute abnormalities.

Musculoskeletal: Degenerative changes in the spine and shoulders.
Areas of bone sclerosis at the vertebral endplates likely
representing degenerative change.

Review of the MIP images confirms the above findings.
IMPRESSION: 1. No evidence of significant pulmonary embolus.
2. Diffuse cardiac enlargement.
3. Bilateral pleural effusions with basilar atelectasis.
4. Interval progression of diffuse patchy and confluent airspace and
interstitial disease in both lungs most likely representing edema
although multifocal pneumonia could also have this appearance.

## 2021-06-28 IMAGING — DX DG CHEST 1V PORT
1 series · 1 of 1 positions shown · non-contrast
Comparison: Chest radiographs [DATE] and earlier.

CLINICAL DATA: 56 year old male with shortness of breath.

EXAM:
PORTABLE CHEST 1 VIEW

[chest]
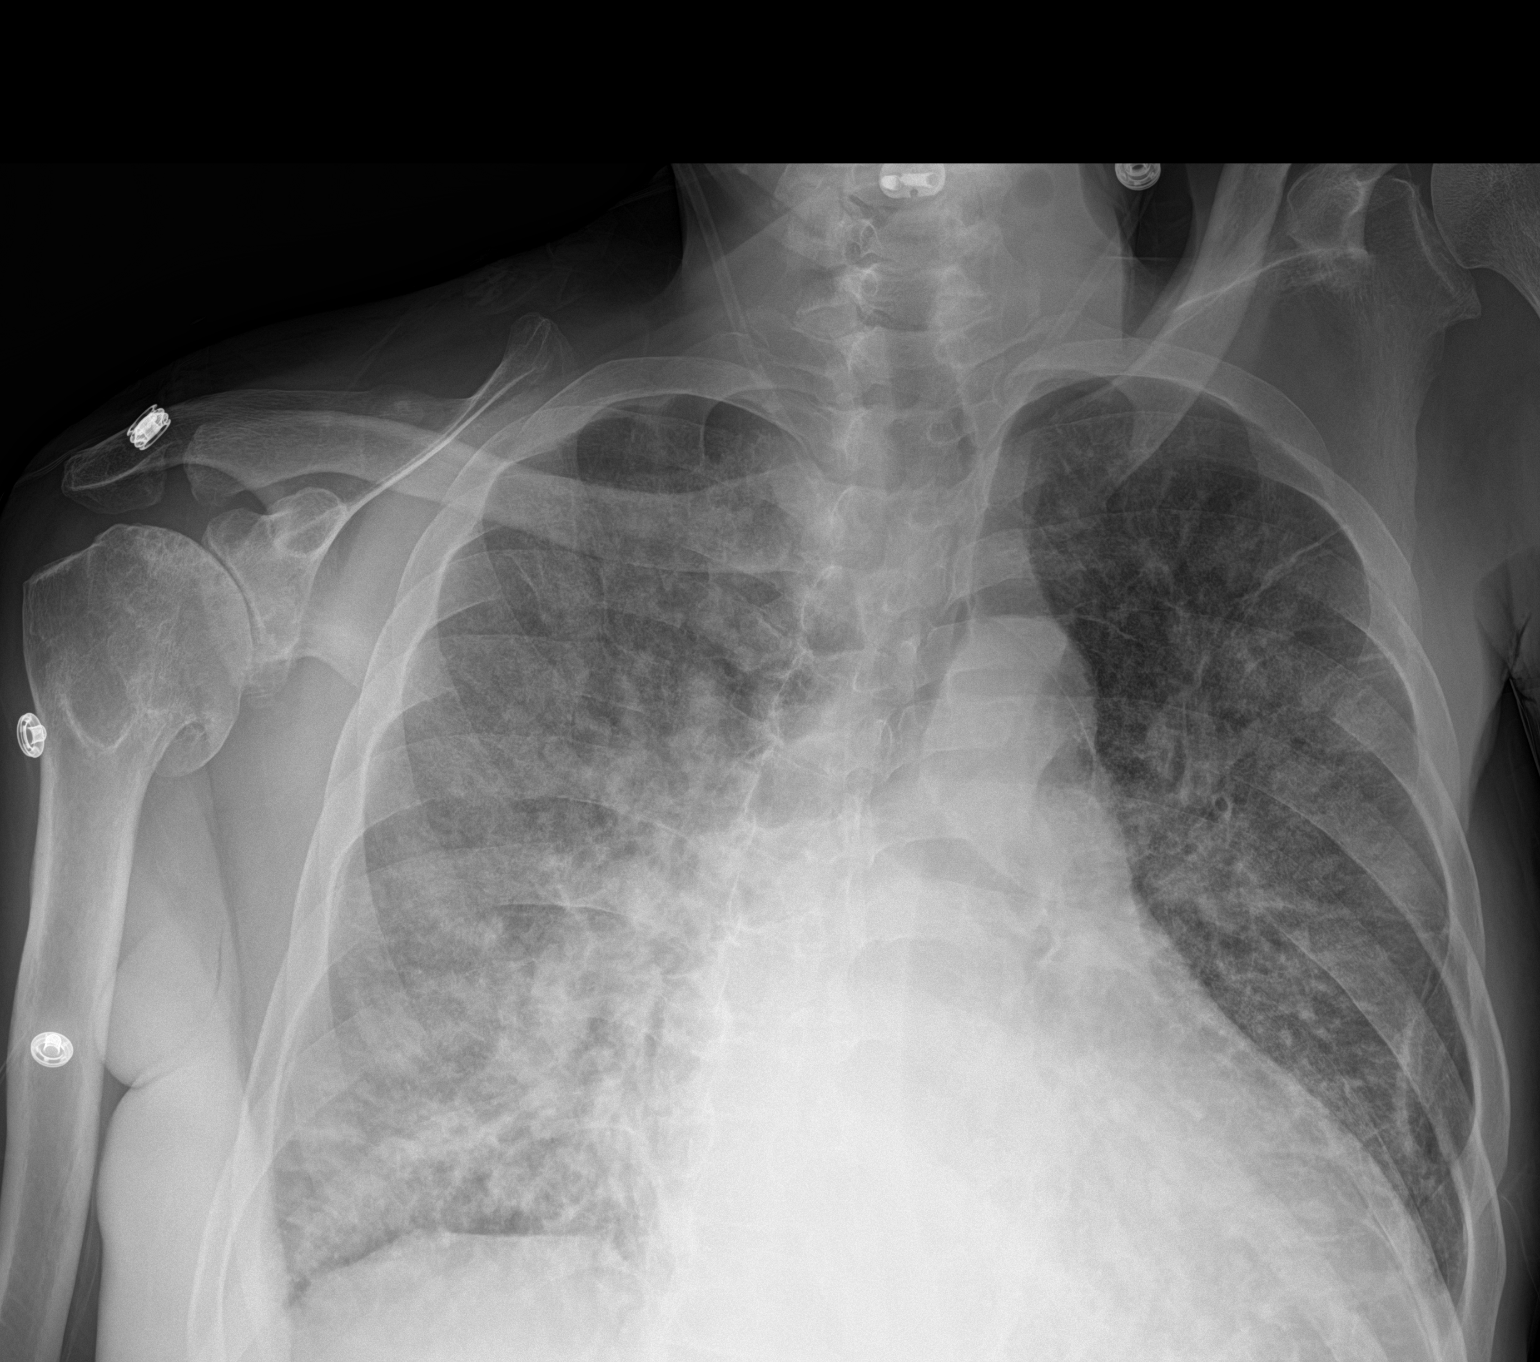

[1 of 1 positions shown; findings below may reference images not displayed]

FINDINGS: Portable AP semi upright view at [OQ] hours. Increased bilateral
pulmonary interstitial opacity, diffuse and slightly greater on the
right. Underlying cardiomegaly again noted. Stable cardiac size and
mediastinal contours. Visualized tracheal air column is within
normal limits. No pneumothorax. No pleural effusion or air
bronchograms identified. No acute osseous abnormality identified.
Chronic severe degeneration at the right glenohumeral joint.
IMPRESSION: Evidence of acute pulmonary edema superimposed on cardiomegaly.
Edema perhaps asymmetrically greater on the right. Viral/atypical
respiratory infection felt less likely.
No pleural effusion or other acute cardiopulmonary abnormality
identified.

## 2021-06-28 MED ORDER — SODIUM CHLORIDE 0.9 % IV SOLN: INTRAVENOUS | Status: DC | PRN

## 2021-06-28 MED ORDER — FUROSEMIDE 10 MG/ML IJ SOLN
40.0000 mg | Freq: Once | INTRAMUSCULAR | Status: AC
  Administered 2021-06-28: 40 mg via INTRAVENOUS
  Filled 2021-06-28: qty 4

## 2021-06-28 MED ORDER — MORPHINE SULFATE (PF) 2 MG/ML IV SOLN
1.0000 mg | INTRAVENOUS | Status: DC | PRN
  Administered 2021-06-29 – 2021-07-05 (×15): 2 mg via INTRAVENOUS
  Filled 2021-06-28 (×15): qty 1

## 2021-06-28 MED ORDER — FUROSEMIDE 10 MG/ML IJ SOLN
40.0000 mg | Freq: Two times a day (BID) | INTRAMUSCULAR | Status: DC
Start: 2021-06-29 — End: 2021-07-03
  Administered 2021-06-29 – 2021-07-03 (×9): 40 mg via INTRAVENOUS
  Filled 2021-06-28 (×9): qty 4

## 2021-06-28 MED ORDER — IOHEXOL 350 MG/ML SOLN
80.0000 mL | Freq: Once | INTRAVENOUS | Status: AC | PRN
  Administered 2021-06-28: 80 mL via INTRAVENOUS

## 2021-06-28 MED ORDER — IPRATROPIUM-ALBUTEROL 0.5-2.5 (3) MG/3ML IN SOLN
3.0000 mL | Freq: Once | RESPIRATORY_TRACT | Status: AC
  Administered 2021-06-28: 3 mL via RESPIRATORY_TRACT
  Filled 2021-06-28: qty 3

## 2021-06-28 MED ORDER — MAGNESIUM SULFATE 2 GM/50ML IV SOLN
2.0000 g | Freq: Once | INTRAVENOUS | Status: AC
  Administered 2021-06-28: 2 g via INTRAVENOUS
  Filled 2021-06-28: qty 50

## 2021-06-28 NOTE — Progress Notes (Signed)
[facility]  Date of Admission:  06/16/2021           Reason for visit: Follow up on MSSA Bacteremia  Current antibiotics: Cefazolin  ASSESSMENT:    56 y.o. male admitted with:  MSSA bacteremia: Complicated by [id number] epidural abscess with discitis/OM.  Status post anterior cervical decompression and abscess evacuation with instrumentation on 06/23/21.  OR cultures grew MSSA. Currently on Cefazolin.  Per cardiology, patient is high risk for TEE and so this was not been obtained although unlikely to change antibiotic plan at this juncture. Infected sacral decubitus ulcer: Completed a course of Unasyn x 10 days and now on Cefazolin for problem #1.  Right shoulder pain and UE swelling: CT shoulder shows severe glenohumeral joint osteoarthritis. Suspect small glenohumeral joint effusion, presumably reactive/degenerative. Septic arthritis is unable to be excluded by imaging.  Venous duplex pending.  Acute HFrEF:  TTE 06/15/21 showed LVEF 30-35%, global hypokinesis.  Diuresis per primary team.  RECOMMENDATIONS:    Continue cefazolin Wound care Lab monitoring If right UE duplex is negative, consider aspiration of shoulder to rule out septic arthritis Will follow   Principal Problem:   MSSA bacteremia Active Problems:   Hypertension   Protein-calorie malnutrition, severe   Anemia of chronic disease   Generalized weakness   Pressure injury of buttock, stage 2 (HCC)   Acute systolic (congestive) heart failure (HCC)   Prolonged QT interval   Epidural abscess   Abscess in epidural space of cervical spine    MEDICATIONS:    Scheduled Meds:  feeding supplement  1 Container Oral TID BM   heparin injection (subcutaneous)  5,000 Units Subcutaneous Q8H   lidocaine  1 patch Transdermal Q24H   metoprolol tartrate  25 mg Oral BID   multivitamin with minerals  1 tablet Oral Daily   potassium chloride  40 mEq Oral BID   senna-docusate  2 tablet Oral BID    Continuous Infusions:   ceFAZolin (ANCEF) IV 2 g (06/28/21 0956)   PRN Meds:.acetaminophen **OR** acetaminophen, bisacodyl, diazepam, hydrALAZINE, magnesium hydroxide, menthol-cetylpyridinium **OR** phenol, morphine injection, oxyCODONE, polyethylene glycol  SUBJECTIVE:   24 hour events:  No acute events Feeling more short of breath, no fevers Getting an [name] today    Review of Systems  All other systems reviewed and are negative.    OBJECTIVE:   Blood pressure (!) 132/108, pulse (!) 109, temperature 97.6 F (36.4 C), temperature source Oral, resp. rate 18, height 5\' 5"  (1.651 m), weight 67.9 kg, SpO2 94 %. Body mass index is 24.91 kg/m.  Physical Exam Constitutional:      General: He is not in acute distress.    Comments: Chronically ill appearing, sitting up in the chair.   HENT:     Head: Normocephalic and atraumatic.     Mouth/Throat:     Comments: Dentition is poor.  Eyes:     Extraocular Movements: Extraocular movements intact.     Conjunctiva/sclera: Conjunctivae normal.  Pulmonary:     Effort: No respiratory distress.     Comments: Mildly increased WOB.  Abdominal:     General: There is no distension.     Palpations: Abdomen is soft.     Tenderness: There is no abdominal tenderness.  Musculoskeletal:        General: Normal range of motion.     Cervical back: Normal range of motion and neck supple.  Skin:    General: Skin is warm and dry.  Findings: No rash.  Neurological:     General: No focal deficit present.     Mental Status: He is alert and oriented to person, place, and time.  Psychiatric:        Mood and Affect: Mood normal.        Behavior: Behavior normal.     Lab Results: Lab Results  Component Value Date   WBC 9.7 06/27/2021   HGB 8.8 (L) 06/27/2021   HCT 28.8 (L) 06/27/2021   MCV 95.4 06/27/2021   PLT 373 06/27/2021    Lab Results  Component Value Date   NA 138 06/28/2021   K 4.0 06/28/2021   CO2 26 06/28/2021   GLUCOSE  120 (H) 06/28/2021   BUN 11 06/28/2021   CREATININE 0.45 (L) 06/28/2021   CALCIUM 8.9 06/28/2021   GFRNONAA >60 06/28/2021   GFRAA >60 01/23/2019    Lab Results  Component Value Date   ALT 13 06/23/2021   AST 20 06/23/2021   ALKPHOS 72 06/23/2021   BILITOT 0.8 06/23/2021       Component Value Date/Time   CRP 15.5 (H) 06/14/2021 1655       Component Value Date/Time   ESRSEDRATE 30 (H) 06/14/2021 1655     I have reviewed the micro and lab results in Epic.  Imaging: CT SHOULDER RIGHT WO CONTRAST  Result Date: 06/27/2021 CLINICAL DATA:  Septic arthritis suspected, shoulder, xray done EXAM: CT OF THE UPPER RIGHT EXTREMITY WITHOUT CONTRAST TECHNIQUE: Multidetector CT imaging of the upper right extremity was performed according to the standard protocol. RADIATION DOSE REDUCTION: This exam was performed according to the departmental dose-optimization program which includes automated exposure control, adjustment of the mA and/or kV according to patient size and/or use of iterative reconstruction technique. COMPARISON:  CT 05/10/2021 FINDINGS: Bones/Joint/Cartilage No acute fracture. No dislocation. Severe glenohumeral joint osteoarthritis manifested by joint space narrowing, subchondral sclerosis/cystic change, and marginal osteophyte formation. There are prominent subchondral cystic changes within the inferior glenoid and superior humeral head. Mild superior glenoid retroversion. Suspect small glenohumeral joint effusion. Moderate degenerative changes of the acromioclavicular joint. No large subacromial-subdeltoid bursal fluid collection. Remaining visualized osseous structures appear grossly intact. Ligaments Suboptimally assessed by CT. Muscles and Tendons Preserved muscle bulk of the rotator cuff musculature without significant atrophy or fatty infiltration. Rotator cuff tendons appear grossly intact within the limitations of CT. Soft tissues Generalized anasarca. No evidence of a organized  fluid collection about the shoulder. IMPRESSION: 1. Severe glenohumeral joint osteoarthritis. Suspect small glenohumeral joint effusion, presumably reactive/degenerative. Septic arthritis is unable to be excluded by imaging. 2. Moderate degenerative changes of the acromioclavicular joint. 3. Generalized anasarca. Electronically Signed   By: Davina Poke D.O.   On: 06/27/2021 18:30   DG CHEST PORT 1 VIEW  Result Date: 06/28/2021 CLINICAL DATA:  56 year old male with shortness of breath. EXAM: PORTABLE CHEST 1 VIEW COMPARISON:  Chest radiographs 06/17/2021 and earlier. FINDINGS: Portable AP semi upright view at 1043 hours. Increased bilateral pulmonary interstitial opacity, diffuse and slightly greater on the right. Underlying cardiomegaly again noted. Stable cardiac size and mediastinal contours. Visualized tracheal air column is within normal limits. No pneumothorax. No pleural effusion or air bronchograms identified. No acute osseous abnormality identified. Chronic severe degeneration at the right glenohumeral joint. IMPRESSION: Evidence of acute pulmonary edema superimposed on cardiomegaly. Edema perhaps asymmetrically greater on the right. Viral/atypical respiratory infection felt less likely. No pleural effusion or other acute cardiopulmonary abnormality identified. Electronically Signed   By:  Genevie Ann M.D.   On: 06/28/2021 10:54   VAS [name] UPPER EXTREMITY VENOUS DUPLEX  Result Date: 06/28/2021 UPPER VENOUS STUDY  Patient Name:  Logan Nelson  Date of Exam:   06/28/2021 Medical Rec #: [id number]             Accession #:    [id number] Date of Birth: August 05, 1965              Patient Gender: M Patient Age:   32 years Exam Location:  [facility] Procedure:      VAS [name] UPPER EXTREMITY VENOUS DUPLEX Referring Phys: Hosie Poisson --------------------------------------------------------------------------------  Indications: Edema Limitations: Edema, bandages, line. Comparison Study: Prior negative  right upper extremity venous duplex done 06/11/21 Performing Technologist: Sharion Dove RVS  Examination Guidelines: A complete evaluation includes B-mode imaging, spectral Doppler, color Doppler, and power Doppler as needed of all accessible portions of each vessel. Bilateral testing is considered an integral part of a complete examination. Limited examinations for reoccurring indications may be performed as noted.  Right Findings: +----------+------------+---------+-----------+----------+-------+ RIGHT     CompressiblePhasicitySpontaneousPropertiesSummary +----------+------------+---------+-----------+----------+-------+ IJV           Full       Yes       Yes                      +----------+------------+---------+-----------+----------+-------+ Subclavian               Yes       Yes                      +----------+------------+---------+-----------+----------+-------+ Axillary      Full       Yes       Yes                      +----------+------------+---------+-----------+----------+-------+ Brachial      Full                                          +----------+------------+---------+-----------+----------+-------+ Radial        Full                                          +----------+------------+---------+-----------+----------+-------+ Ulnar         Full                                          +----------+------------+---------+-----------+----------+-------+ Cephalic      Full                                          +----------+------------+---------+-----------+----------+-------+ Basilic                  Yes       Yes                      +----------+------------+---------+-----------+----------+-------+  Left Findings: +----------+------------+---------+-----------+----------+-------+ LEFT      CompressiblePhasicitySpontaneousPropertiesSummary +----------+------------+---------+-----------+----------+-------+ Subclavian  Yes       Yes                      +----------+------------+---------+-----------+----------+-------+  Summary:  Right: No evidence of deep vein thrombosis in the upper extremity. No evidence of superficial vein thrombosis in the upper extremity. No change since study done 06/11/21  Left: No evidence of thrombosis in the subclavian.  *See table(s) above for measurements and observations.    Preliminary      Imaging independently reviewed in Epic.    [facility] [phone] pager 06/28/2021, 1:01 PM

## 2021-06-28 NOTE — Progress Notes (Signed)
Occupational Therapy Treatment Patient Details Name: Logan Nelson MRN: [id number] DOB: 1965-07-08 Today's Date: 06/28/2021   History of present illness Pt is 56 yr old M admitted on 06/16/21 with c/o weakness and back pain.  Recent admissions for hernia repair and B LE weakness/falls where pt has left AMA both times.  Imaging (+) for C4-5 abcess and OM of coccyx. On 5/17, pt underwent cervical abscess drain and ACDF of C4-5. PMH: anemia, arthritis, bradycardia, falls, HTN   OT comments  Patient continues to make steady progress towards goals in skilled OT session. Patient's session encompassed  education with regard to cervical precautions, ADLs, and functional mobility. Patient with no recollection of cervical precautions, benefiting from visual demonstration from OT, and able to adhere to precautions with movement. R hand noted to be swollen due to positioning, therefore education provided to promote increased blood flow and positioning provided. Patient min guard for functional mobility, with HR 118-120 with movement and VSS on 2L. OT will continue to follow acutely.    Recommendations for follow up therapy are one component of a multi-disciplinary discharge planning process, led by the attending physician.  Recommendations may be updated based on patient status, additional functional criteria and insurance authorization.    Follow Up Recommendations  Skilled nursing-short term rehab (<3 hours/day)    Assistance Recommended at Discharge Frequent or constant Supervision/Assistance  Patient can return home with the following  A lot of help with bathing/dressing/bathroom;Assist for transportation;Assistance with cooking/housework;A little help with walking and/or transfers;Direct supervision/assist for medications management;Direct supervision/assist for financial management   Equipment Recommendations  BSC/3in1;Tub/shower bench;Wheelchair (measurements OT);Wheelchair cushion (measurements  OT)    Recommendations for Other Services      Precautions / Restrictions Precautions Precautions: Fall;Cervical Precaution Booklet Issued: No Precaution Comments: patient requires visual demonstration to recall precautions Restrictions Weight Bearing Restrictions: No       Mobility Bed Mobility   Bed Mobility: Sit to Supine       Sit to supine: Mod assist, HOB elevated   General bed mobility comments: sitting EOB upon OT arrival, significant difficulty returning legs into bed (more than likely due to large Sizewise bed)    Transfers Overall transfer level: Needs assistance Equipment used: Rolling walker (2 wheels) Transfers: Sit to/from Stand Sit to Stand: Min guard           General transfer comment: minimally impulsive to stand but demonstrating increased cognizance for understanding safety concerns with quick movements, decreased awareness noted when using RW, almost walking into objects on R despite cues     Balance Overall balance assessment: Needs assistance Sitting-balance support: Feet supported, Bilateral upper extremity supported Sitting balance-Leahy Scale: Fair Sitting balance - Comments: sitting EOB on air matress   Standing balance support: Bilateral upper extremity supported, During functional activity Standing balance-Leahy Scale: Poor Standing balance comment: reliant on RW for support                           ADL either performed or assessed with clinical judgement   ADL                                         General ADL Comments: Session focus on functional mobility and reiteration of cervical precautions    Extremity/Trunk Assessment  Vision       Perception     Praxis      Cognition Arousal/Alertness: Awake/alert Behavior During Therapy: WFL for tasks assessed/performed Overall Cognitive Status: No family/caregiver present to determine baseline cognitive functioning Area of  Impairment: Safety/judgement, Awareness, Problem solving, Attention, Memory                   Current Attention Level: Sustained Memory: Decreased short-term memory, Decreased recall of precautions Following Commands: Follows one step commands with increased time, Follows multi-step commands consistently Safety/Judgement: Decreased awareness of safety, Decreased awareness of deficits Awareness: Emergent Problem Solving: Requires verbal cues, Requires tactile cues General Comments: patient benefits from visual demonstrations to assist in understanding        Exercises      Shoulder Instructions       General Comments      Pertinent Vitals/ Pain       Pain Assessment Pain Assessment: Faces Faces Pain Scale: Hurts a little bit Pain Location: generalized with movement Pain Descriptors / Indicators: Guarding Pain Intervention(s): Limited activity within patient's tolerance, Monitored during session, Repositioned  Home Living                                          Prior Functioning/Environment              Frequency  Min 2X/week        Progress Toward Goals  OT Goals(current goals can now be found in the care plan section)  Progress towards OT goals: Progressing toward goals  Acute Rehab OT Goals Patient Stated Goal: to do more activity with PT tomorrow OT Goal Formulation: With patient Time For Goal Achievement: 07/03/21 Potential to Achieve Goals: Fair  Plan Discharge plan remains appropriate    Co-evaluation                 AM-PAC OT "6 Clicks" Daily Activity     Outcome Measure   Help from another person eating meals?: None Help from another person taking care of personal grooming?: A Little Help from another person toileting, which includes using toliet, bedpan, or urinal?: A Lot Help from another person bathing (including washing, rinsing, drying)?: A Lot Help from another person to put on and taking off regular  upper body clothing?: A Little Help from another person to put on and taking off regular lower body clothing?: A Lot 6 Click Score: 16    End of Session Equipment Utilized During Treatment: Gait belt;Rolling walker (2 wheels);Oxygen  OT Visit Diagnosis: Unsteadiness on feet (R26.81);Other abnormalities of gait and mobility (R26.89);Muscle weakness (generalized) (M62.81);History of falling (Z91.81) Hemiplegia - Right/Left: Right Hemiplegia - dominant/non-dominant: Dominant Hemiplegia - caused by: Unspecified   Activity Tolerance Patient tolerated treatment well   Patient Left in bed;with call bell/phone within reach   Nurse Communication Mobility status;Other (comment) (NT to empty urinal canister)        Time: 1601-0932 OT Time Calculation (min): 34 min  Charges: OT General Charges $OT Visit: 1 Visit OT Treatments $Self Care/Home Management : 23-37 mins  Pollyann Glen E. Markeese Boyajian, OTR/L Acute Rehabilitation Services [phone]   Cherlyn Cushing 06/28/2021, 3:52 PM

## 2021-06-28 NOTE — Progress Notes (Signed)
[facility]                                                                               Logan Nelson, is a 56 y.o. male, DOB - [birth year], [id number] Admit date - 06/16/2021    Outpatient Primary MD for the patient is Logan Mai, MD  LOS - 11  days    Brief summary   56 year old gentleman with history of hypertension, anasarca, recently admitted for incarcerated abdominal hernia with primary repair  Who left [facility] before he was placed to a rehab.  He subsequently again admitted from 4/4-5/11 with severe debility and malnutrition and failure to thrive.  While he was in the hospital, developed MSSA bacteremia and sepsis, back pain. Was under treatment but left AMA 5/10 just to come back due to profound weakness and now decided he will complete treatment. This admission he underwent anterior cervical decompression and abscess evacuation. Intra operative cultures growing MSSA. ID onboard and recommended 6 weeks of IV ancef. Meanwhile trying to obtain dental consult for evaluation of dental caries.   This morning patient was seen, reported sob, and is on 2 lit of [place] oxygen.    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  Source from ? sacral decubitus ulcer vs dental caries. Blood cultures from 06/14/2021 show MSSA and repeat cultures have been negative so far.   Patient remains on Ancef, ID on board. Recommendations to completed 6 weeks of ANCEF followed by suppression with cefadroxil.  Intra operative Cultures growing staph aureus sensitive to oxacillin.  Neurosurgery on board  and pt underwent anterior cervical decompression and abscess evacuation with placement of screws.  TTE is negative for age vegetation.  Reached out to Cardiology for TEE, but as per Dr Gavin Potters, patient is not a candidate for TEE in view of his recent cervical spine surgery. Please reach out to  cardiology prior to discharge to see if he isa a candidate for TEE as he is a difficult to place.  Pt remains afebrile and leukocytosis is improving.  Unfortunately pt is difficult to place in SNF. Will need to stay inpatient for the course of IV antibiotics.     Acute systolic congestive heart failure/ acute respiratory failure with hypoxia secondary to pulmonary edema. Patient reports sob, has tachypnea, requiring 2 lit of [place] oxygen.  CXR ordered, showed pulmonary edema, . Patient requiring 2 lit of [place] oxygen to keep sats greater than 90%. BNP ordered, troponin.   IV lasix 40 mg ordered. Started him on IV lasix 40 mg daily. Strict in take and output, daily weights.  In view of his right upper extremity swelling, suspicious for DVT, will go ahead and get CT angio to evaluate for PE.  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 Patient will need ischemic evaluation after infection improved.  Tachycardia: EKG shows sinus tachy, with  t wave inversions in the lateral leads, with QT prolongation.  Probably from increased work of breathing.  Keep k>4 and Magnesium >2.  Severe protein calorie malnutrition Dietary on board.   Hypertension Blood pressure parameters are suboptimally controlled.    Right shoulder pain and right upper extremity swelling  CT shoulder shows Severe glenohumeral joint osteoarthritis. Suspect small glenohumeral joint effusion, presumably reactive/degenerative. Septic arthritis is unable to be excluded by imaging. Moderate degenerative changes of the acromioclavicular joint. Right upper extremity swelling, with tenderness, Venous duplex of the RUE ordered to evaluate for DVT.    Hypokalemia, hypomagnesemia and hypophosphatemia:  Replaced, recheck in am.    Anemia of chronic disease Continue to monitor. Normocytic.    Severe debility and frailty Therapy  evaluations recommending SNF  Severe malnutrition.  Dietary on board to assist with supplementation.    Constipation:  - started him on bowel regimen.  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 Subcutaneous fat may be visible but bone, tendon or muscle are NOT exposed. 2 in vertical by .5 in horizontal--approximately--wound tunneling and pink (Active)  06/17/21 1159  Location: Sacrum  Location Orientation:   Staging: Stage 3 -  Full thickness tissue loss. Subcutaneous fat may be visible but bone, tendon or muscle are NOT exposed.  Wound Description (Comments): 2 in vertical by .5 in horizontal--approximately--wound tunneling and pink  Present on Admission:   Dressing Type Foam - Lift dressing to assess site every shift;Gauze (Comment);Moist to dry 06/28/21 1015  Local wound care.   Malnutrition Type:  Nutrition Problem: Severe Malnutrition Etiology: chronic illness (h/o hernia)   Malnutrition Characteristics:  Signs/Symptoms: severe fat depletion, severe muscle depletion   Nutrition Interventions:  Interventions: Boost Breeze, MVI, Prostat, Refer to RD note for recommendations  Estimated body mass index is 23.96 kg/m as calculated from the following:   Height as of this encounter: 5' 5.98" (1.676 m).   Weight as of this encounter: 67.3 kg.  Code Status: full code.  DVT Prophylaxis:  heparin injection 5,000 Units Start: 06/23/21 1715 SCDs Start: 06/17/21 1144   Level of Care: Level of care: Progressive Family Communication: none at bedside.   Disposition Plan:     Remains inpatient appropriate:  IV antibiotics. SNF placement.   Procedures:  anterior cervical decompression and arthrodesis for abscess at levels C4/5.by Dr Christella Noa on 5/17  Consultants:   Neuro surgery.  ID  Antimicrobials:   Anti-infectives (From admission, onward)    Start     Dose/Rate Route  Frequency Ordered Stop   06/24/21 0200  ceFAZolin (ANCEF) IVPB 2g/100 mL premix        2 g 200 mL/hr over 30 Minutes Intravenous Every 8 hours 06/21/21 1058     06/23/21 1514  vancomycin (VANCOCIN) powder  Status:  Discontinued          As needed 06/23/21 1515 06/23/21 1524   06/17/21 1230  Ampicillin-Sulbactam (UNASYN) 3 g in sodium chloride 0.9 % 100 mL IVPB        3 g 200 mL/hr over 30 Minutes Intravenous Every 6 hours 06/17/21 1219 06/23/21 2359   06/17/21 0430  Ampicillin-Sulbactam (UNASYN) 3 g in sodium chloride 0.9 % 100 mL IVPB        3 g 200 mL/hr over 30 Minutes Intravenous  Once 06/17/21 0407 06/17/21 0619   06/17/21 0415  metroNIDAZOLE (FLAGYL) IVPB 500 mg  Status:  Discontinued        500 mg 100 mL/hr over 60 Minutes Intravenous Every 12 hours 06/17/21 0407 06/17/21 1226  Medications  Scheduled Meds:  feeding supplement  1 Container Oral TID BM   heparin injection (subcutaneous)  5,000 Units Subcutaneous Q8H   lidocaine  1 patch Transdermal Q24H   metoprolol tartrate  25 mg Oral BID   multivitamin with minerals  1 tablet Oral Daily   potassium chloride  40 mEq Oral BID   senna-docusate  2 tablet Oral BID   Continuous Infusions:   ceFAZolin (ANCEF) IV 2 g (06/28/21 0956)   PRN Meds:.acetaminophen **OR** acetaminophen, bisacodyl, diazepam, hydrALAZINE, magnesium hydroxide, menthol-cetylpyridinium **OR** phenol, morphine injection, oxyCODONE, polyethylene glycol    Subjective:   Obrian Flanary was seen and examined today. Pt reports sob, on oxygen, unable to lie flat,    Objective:   Vitals:   06/27/21 2154 06/28/21 0621 06/28/21 0813 06/28/21 1010  BP: (!) 169/86 (!) 177/110 (!) 141/111 (!) 152/117  Pulse: (!) 101 (!) 128 (!) 115 (!) 117  Resp: 20 (!) 24 18 19   Temp: 99.1 F (37.3 C) 98.7 F (37.1 C) 98.1 F (36.7 C) 98.8 F (37.1 C)  TempSrc: Oral Oral Oral Oral  SpO2: 96% 93% 100% 98%  Weight:      Height:        Intake/Output Summary  (Last 24 hours) at 06/28/2021 1205 Last data filed at 06/28/2021 1113 Gross per 24 hour  Intake 1031.25 ml  Output 1800 ml  Net -768.75 ml   Filed Weights   06/18/21 0600  Weight: 67.3 kg     Exam General exam: ill appearing gentleman, in mild distress from sob.  Respiratory system: crackles at bases. On 2l it of [place] oxygen . Scattered wheezing heard.  Cardiovascular system: S1 & S2 heard,tachycardic, JVD cannot be appreciated, No pedal edema. Gastrointestinal system: Abdomen is soft, distended, non tender, bowel sounds wnl.  Central nervous system: Alert and oriented. No focal neurological deficits. Extremities: Symmetric 5 x 5 power. Skin: sacral decubitus ulcer.  Psychiatry: anxious.        Data Reviewed:  I have personally reviewed following labs and imaging studies   CBC Lab Results  Component Value Date   WBC 9.7 06/27/2021   RBC 3.02 (L) 06/27/2021   HGB 8.8 (L) 06/27/2021   HCT 28.8 (L) 06/27/2021   MCV 95.4 06/27/2021   MCH 29.1 06/27/2021   PLT 373 06/27/2021   MCHC 30.6 06/27/2021   RDW 17.9 (H) 06/27/2021   LYMPHSABS 1.6 06/27/2021   MONOABS 1.2 (H) 06/27/2021   EOSABS 0.2 06/27/2021   BASOSABS 0.0 [id number]     Last metabolic panel Lab Results  Component Value Date   NA 141 06/27/2021   K 3.3 (L) 06/27/2021   CL 110 06/27/2021   CO2 27 06/27/2021   BUN 10 06/27/2021   CREATININE 0.40 (L) 06/27/2021   GLUCOSE 119 (H) 06/27/2021   GFRNONAA >60 06/27/2021   GFRAA >60 01/23/2019   CALCIUM 8.2 (L) 06/27/2021   PHOS 3.3 06/23/2021   PROT 5.6 (L) 06/23/2021   ALBUMIN 2.3 (L) 06/23/2021   LABGLOB 2.6 04/13/2021   AGRATIO 1.0 04/13/2021   BILITOT 0.8 06/23/2021   ALKPHOS 72 06/23/2021   AST 20 06/23/2021   ALT 13 06/23/2021   ANIONGAP 4 (L) 06/27/2021    CBG (last 3)  No results for input(s): GLUCAP in the last 72 hours.    Coagulation Profile: No results for input(s): INR, PROTIME in the last 168 hours.   Radiology Studies: CT  SHOULDER RIGHT WO CONTRAST  Result Date: 06/27/2021 CLINICAL DATA:  Septic arthritis suspected, shoulder, xray done EXAM: CT OF THE UPPER RIGHT EXTREMITY WITHOUT CONTRAST TECHNIQUE: Multidetector CT imaging of the upper right extremity was performed according to the standard protocol. RADIATION DOSE REDUCTION: This exam was performed according to the departmental dose-optimization program which includes automated exposure control, adjustment of the mA and/or kV according to patient size and/or use of iterative reconstruction technique. COMPARISON:  CT 05/10/2021 FINDINGS: Bones/Joint/Cartilage No acute fracture. No dislocation. Severe glenohumeral joint osteoarthritis manifested by joint space narrowing, subchondral sclerosis/cystic change, and marginal osteophyte formation. There are prominent subchondral cystic changes within the inferior glenoid and superior humeral head. Mild superior glenoid retroversion. Suspect small glenohumeral joint effusion. Moderate degenerative changes of the acromioclavicular joint. No large subacromial-subdeltoid bursal fluid collection. Remaining visualized osseous structures appear grossly intact. Ligaments Suboptimally assessed by CT. Muscles and Tendons Preserved muscle bulk of the rotator cuff musculature without significant atrophy or fatty infiltration. Rotator cuff tendons appear grossly intact within the limitations of CT. Soft tissues Generalized anasarca. No evidence of a organized fluid collection about the shoulder. IMPRESSION: 1. Severe glenohumeral joint osteoarthritis. Suspect small glenohumeral joint effusion, presumably reactive/degenerative. Septic arthritis is unable to be excluded by imaging. 2. Moderate degenerative changes of the acromioclavicular joint. 3. Generalized anasarca. Electronically Signed   By: Davina Poke D.O.   On: 06/27/2021 18:30   DG CHEST PORT 1 VIEW  Result Date: 06/28/2021 CLINICAL DATA:  55 year old male with shortness of breath.  EXAM: PORTABLE CHEST 1 VIEW COMPARISON:  Chest radiographs 06/17/2021 and earlier. FINDINGS: Portable AP semi upright view at 1043 hours. Increased bilateral pulmonary interstitial opacity, diffuse and slightly greater on the right. Underlying cardiomegaly again noted. Stable cardiac size and mediastinal contours. Visualized tracheal air column is within normal limits. No pneumothorax. No pleural effusion or air bronchograms identified. No acute osseous abnormality identified. Chronic severe degeneration at the right glenohumeral joint. IMPRESSION: Evidence of acute pulmonary edema superimposed on cardiomegaly. Edema perhaps asymmetrically greater on the right. Viral/atypical respiratory infection felt less likely. No pleural effusion or other acute cardiopulmonary abnormality identified. Electronically Signed   By: Genevie Ann M.D.   On: 06/28/2021 10:54       Hosie Poisson M.D. [facility] 06/28/2021, 12:05 PM  Available via Epic secure chat 7am-7pm After 7 pm, please refer to night coverage provider listed on amion.

## 2021-06-28 NOTE — Progress Notes (Signed)
   06/28/21 0621  Assess: MEWS Score  Temp 98.7 F (37.1 C)  BP (!) 177/110  Pulse Rate (!) 128  Resp (!) 24  SpO2 93 %  O2 Device Nasal Cannula  O2 Flow Rate (L/min) 2 L/min  Assess: MEWS Score  MEWS Temp 0  MEWS Systolic 0  MEWS Pulse 2  MEWS RR 1  MEWS LOC 0  MEWS Score 3  MEWS Score Color Yellow  Assess: if the MEWS score is Yellow or Red  Were vital signs taken at a resting state? Yes  Focused Assessment No change from prior assessment  Early Detection of Sepsis Score *See Row Information* Low  MEWS guidelines implemented *See Row Information* Yes  Treat  MEWS Interventions Escalated (See documentation below)  Take Vital Signs  Increase Vital Sign Frequency  Yellow: Q 2hr X 2 then Q 4hr X 2, if remains yellow, continue Q 4hrs  Escalate  MEWS: Escalate Yellow: discuss with charge nurse/RN and consider discussing with provider and RRT  Notify: Charge Nurse/RN  Name of Charge Nurse/RN Notified Jewel, RN  Date Charge Nurse/RN Notified 06/28/21  Time Charge Nurse/RN Notified 0630  Notify: Rapid Response  Name of Rapid Response RN Notified David, RRT  Date Rapid Response Notified 06/28/21  Time Rapid Response Notified 0625  Document  Patient Outcome Stabilized after interventions  Progress note created (see row info) Yes   Patient having ABD breathing while lying in bed but get better when he sat up at the edge of the bed. RRT order incentive spirometer and flutter valve for patient to use. Patient educated on how to use them and he expressed understanding. We continue to monitor.

## 2021-06-28 NOTE — Progress Notes (Signed)
PT Cancellation Note  Patient Details Name: Logan Nelson MRN: [id number] DOB: [birth year]   Cancelled Treatment:    Reason Eval/Treat Not Completed: Pain limiting ability to participate;Fatigue/lethargy limiting ability to participate. Patient asking for pain meds, then sleeping soundly sitting up on side of bed. Will re-attempt later as time allows.     Stella Encarnacion 06/28/2021, 2:42 PM

## 2021-06-28 NOTE — Progress Notes (Signed)
VASCULAR LAB    Right upper extremity venous duplex has been performed.  See CV proc for preliminary results.   Letanya Froh, RVT 06/28/2021, 12:10 PM

## 2021-06-28 NOTE — Progress Notes (Signed)
Pt is have labored breathing. MD aware. Pt transfer to 3E06, report given. Pt dressing change see flow sheet.

## 2021-06-29 ENCOUNTER — Inpatient Hospital Stay: Payer: Self-pay

## 2021-06-29 DIAGNOSIS — I248 Other forms of acute ischemic heart disease: Secondary | ICD-10-CM | POA: Diagnosis not present

## 2021-06-29 DIAGNOSIS — B9561 Methicillin susceptible Staphylococcus aureus infection as the cause of diseases classified elsewhere: Secondary | ICD-10-CM | POA: Diagnosis not present

## 2021-06-29 DIAGNOSIS — J9601 Acute respiratory failure with hypoxia: Secondary | ICD-10-CM | POA: Diagnosis not present

## 2021-06-29 DIAGNOSIS — I110 Hypertensive heart disease with heart failure: Secondary | ICD-10-CM | POA: Diagnosis not present

## 2021-06-29 DIAGNOSIS — M009 Pyogenic arthritis, unspecified: Secondary | ICD-10-CM | POA: Diagnosis not present

## 2021-06-29 DIAGNOSIS — I5021 Acute systolic (congestive) heart failure: Secondary | ICD-10-CM | POA: Diagnosis not present

## 2021-06-29 DIAGNOSIS — L89153 Pressure ulcer of sacral region, stage 3: Secondary | ICD-10-CM | POA: Diagnosis not present

## 2021-06-29 DIAGNOSIS — R188 Other ascites: Secondary | ICD-10-CM | POA: Diagnosis not present

## 2021-06-29 DIAGNOSIS — G061 Intraspinal abscess and granuloma: Secondary | ICD-10-CM | POA: Diagnosis not present

## 2021-06-29 DIAGNOSIS — R7881 Bacteremia: Secondary | ICD-10-CM | POA: Diagnosis not present

## 2021-06-29 DIAGNOSIS — R531 Weakness: Secondary | ICD-10-CM | POA: Diagnosis not present

## 2021-06-29 DIAGNOSIS — E43 Unspecified severe protein-calorie malnutrition: Secondary | ICD-10-CM | POA: Diagnosis not present

## 2021-06-29 DIAGNOSIS — E1169 Type 2 diabetes mellitus with other specified complication: Secondary | ICD-10-CM | POA: Diagnosis not present

## 2021-06-29 DIAGNOSIS — A4101 Sepsis due to Methicillin susceptible Staphylococcus aureus: Secondary | ICD-10-CM | POA: Diagnosis not present

## 2021-06-29 DIAGNOSIS — D638 Anemia in other chronic diseases classified elsewhere: Secondary | ICD-10-CM | POA: Diagnosis not present

## 2021-06-29 LAB — BASIC METABOLIC PANEL
Anion gap: 6 (ref 5–15)
BUN: 13 mg/dL (ref 6–20)
CO2: 25 mmol/L (ref 22–32)
Calcium: 8.7 mg/dL — ABNORMAL LOW (ref 8.9–10.3)
Chloride: 108 mmol/L (ref 98–111)
Creatinine, Ser: 0.53 mg/dL — ABNORMAL LOW (ref 0.61–1.24)
GFR, Estimated: >60 mL/min (ref >60)
Glucose, Bld: 103 mg/dL — ABNORMAL HIGH (ref 70–99)
Potassium: 4.5 mmol/L (ref 3.5–5.1)
Sodium: 139 mmol/L (ref 135–145)

## 2021-06-29 LAB — CBC WITH DIFFERENTIAL/PLATELET
Abs Immature Granulocytes: 0.05 10*3/uL (ref 0.00–0.07)
Basophils Absolute: 0.1 10*3/uL (ref 0.0–0.1)
Basophils Relative: 1 %
Eosinophils Absolute: 0.1 10*3/uL (ref 0.0–0.5)
Eosinophils Relative: 1 %
HCT: 31.7 % — ABNORMAL LOW (ref 39.0–52.0)
Hemoglobin: 9.6 g/dL — ABNORMAL LOW (ref 13.0–17.0)
Immature Granulocytes: 0 %
Lymphocytes Relative: 18 %
Lymphs Abs: 2.5 10*3/uL (ref 0.7–4.0)
MCH: 28.6 pg (ref 26.0–34.0)
MCHC: 30.3 g/dL (ref 30.0–36.0)
MCV: 94.3 fL (ref 80.0–100.0)
Monocytes Absolute: 1.3 10*3/uL — ABNORMAL HIGH (ref 0.1–1.0)
Monocytes Relative: 10 %
Neutro Abs: 9.4 10*3/uL — ABNORMAL HIGH (ref 1.7–7.7)
Neutrophils Relative %: 70 %
Platelets: 423 10*3/uL — ABNORMAL HIGH (ref 150–400)
RBC: 3.36 MIL/uL — ABNORMAL LOW (ref 4.22–5.81)
RDW: 18.3 % — ABNORMAL HIGH (ref 11.5–15.5)
WBC: 13.5 10*3/uL — ABNORMAL HIGH (ref 4.0–10.5)
nRBC: 0 % (ref 0.0–0.2)

## 2021-06-29 LAB — PHOSPHORUS: Phosphorus: 3 mg/dL (ref 2.5–4.6)

## 2021-06-29 LAB — MAGNESIUM: Magnesium: 1.8 mg/dL (ref 1.7–2.4)

## 2021-06-29 MED ORDER — METOPROLOL TARTRATE 50 MG PO TABS
50.0000 mg | ORAL_TABLET | Freq: Two times a day (BID) | ORAL | Status: AC
  Administered 2021-06-29 – 2021-07-03 (×9): 50 mg via ORAL
  Filled 2021-06-29 (×9): qty 1

## 2021-06-29 MED ORDER — METOPROLOL TARTRATE 5 MG/5ML IV SOLN
5.0000 mg | Freq: Once | INTRAVENOUS | Status: AC
  Administered 2021-06-29: 5 mg via INTRAVENOUS
  Filled 2021-06-29: qty 5

## 2021-06-29 NOTE — Progress Notes (Signed)
Physical Therapy Treatment Patient Details Name: Logan Nelson MRN: [id number] DOB: 04/10/1965 Today's Date: 06/29/2021   History of Present Illness Pt is 56 yr old M admitted on 06/16/21 with c/o weakness and back pain.  Recent admissions for hernia repair and B LE weakness/falls where pt has left AMA both times.  Imaging (+) for C4-5 abcess and OM of coccyx. On 5/17, pt underwent cervical abscess drain and ACDF of C4-5. PMH: anemia, arthritis, bradycardia, falls, HTN    PT Comments    Pt was seen for short session with bed ex as he had recently completed a longer walk with mobility tech.  Talked with him about goals of therapy, after which he asked why he has to stay here longer.  Messaged team to ask about the reasons for ongoing stay, and will hopefully get him soon an answer.  Follow acutely for goals of PT as outlined in POC.   Recommendations for follow up therapy are one component of a multi-disciplinary discharge planning process, led by the attending physician.  Recommendations may be updated based on patient status, additional functional criteria and insurance authorization.  Follow Up Recommendations  Skilled nursing-short term rehab (<3 hours/day)     Assistance Recommended at Discharge Frequent or constant Supervision/Assistance  Patient can return home with the following A lot of help with walking and/or transfers;A lot of help with bathing/dressing/bathroom;Assistance with cooking/housework;Assistance with feeding;Direct supervision/assist for medications management;Assist for transportation;Direct supervision/assist for financial management;Help with stairs or ramp for entrance   Equipment Recommendations  Rolling walker (2 wheels);Wheelchair (measurements PT);Wheelchair cushion (measurements PT);Other (comment)    Recommendations for Other Services       Precautions / Restrictions Precautions Precautions: Fall;Cervical Precaution Booklet Issued: Yes  (comment) Restrictions Weight Bearing Restrictions: No     Mobility  Bed Mobility Overal bed mobility: Needs Assistance             General bed mobility comments: stayed in bed    Transfers                        Ambulation/Gait                   Stairs             Wheelchair Mobility    Modified Rankin (Stroke Patients Only)       Balance                                            Cognition Arousal/Alertness: Awake/alert Behavior During Therapy: WFL for tasks assessed/performed Overall Cognitive Status: No family/caregiver present to determine baseline cognitive functioning Area of Impairment: Safety/judgement, Awareness, Problem solving, Attention, Memory                   Current Attention Level: Selective Memory: Decreased short-term memory Following Commands: Follows one step commands with increased time Safety/Judgement: Decreased awareness of safety, Decreased awareness of deficits Awareness: Emergent Problem Solving: Requires verbal cues, Slow processing          Exercises General Exercises - Lower Extremity Ankle Circles/Pumps: AROM, 5 reps Quad Sets: AROM, 10 reps Gluteal Sets: AROM, 10 reps Short Arc Quad: AAROM, 10 reps Heel Slides: AROM, 10 reps Hip ABduction/ADduction: AROM, 10 reps Straight Leg Raises: AROM, 10 reps    General Comments General comments (skin integrity, edema, etc.):  Pt was in bed this afternoon before PT arrived, thinks he can do the ex though      Pertinent Vitals/Pain Pain Assessment Pain Assessment: Faces Faces Pain Scale: Hurts a little bit Pain Location: wound on sacrum Pain Descriptors / Indicators: Guarding Pain Intervention(s): Limited activity within patient's tolerance, Monitored during session, Premedicated before session, Repositioned    Home Living                          Prior Function            PT Goals (current goals can now be  found in the care plan section) Acute Rehab PT Goals Patient Stated Goal: get home ASAP Progress towards PT goals: Progressing toward goals    Frequency    Min 2X/week      PT Plan Current plan remains appropriate    Co-evaluation              AM-PAC PT "6 Clicks" Mobility   Outcome Measure  Help needed turning from your back to your side while in a flat bed without using bedrails?: A Lot Help needed moving from lying on your back to sitting on the side of a flat bed without using bedrails?: A Lot Help needed moving to and from a bed to a chair (including a wheelchair)?: A Lot Help needed standing up from a chair using your arms (e.g., wheelchair or bedside chair)?: A Lot Help needed to walk in hospital room?: A Lot Help needed climbing 3-5 steps with a railing? : A Lot 6 Click Score: 12    End of Session Equipment Utilized During Treatment: Gait belt Activity Tolerance: Patient limited by fatigue Patient left: in bed;with call bell/phone within reach;with bed alarm set Nurse Communication: Mobility status PT Visit Diagnosis: Other abnormalities of gait and mobility (R26.89);History of falling (Z91.81)     Time: [id number] PT Time Calculation (min) (ACUTE ONLY): 20 min  Charges:  $Therapeutic Exercise: 8-22 mins $Therapeutic Activity: 8-22 mins    Ramond Dial 06/29/2021, 4:14 PM  Mee Hives, PT PhD Acute Rehab Dept. Number: [place] and [place]

## 2021-06-29 NOTE — Progress Notes (Signed)
Patient noncompliant with fall risk precautions including bed alarm, use of bed rails, and dangling on edge of bed. Patient A&Ox4 with no legal guardian or medical/psych history to justify going against patient wishes. Informed patient that he has the right to refuse interventions but refusal puts him at risk of falls and complications resulting from them. Patient expresses understanding. Bed alarm off and bed rails down as of time of writing.

## 2021-06-29 NOTE — Progress Notes (Addendum)
[facility]                                                                               Mekel Arnwine, is a 56 y.o. male, DOB - 22-Nov-1965, HYQ:[id number] Admit date - 06/16/2021    Outpatient Primary MD for the patient is [name], MD  LOS - 12  days    Brief summary   56 year old gentleman with history of hypertension, anasarca, recently admitted for incarcerated abdominal hernia with primary repair  Who left AGAINST MEDICAL ADVICE before he was placed to a rehab.  He subsequently again admitted from 4/4-5/11 with severe debility and malnutrition and failure to thrive.  While he was in the hospital, developed MSSA bacteremia and sepsis, back pain. Was under treatment but left AMA 5/10 just to come back due to profound weakness and now decided he will complete treatment. This admission he underwent anterior cervical decompression and abscess evacuation. Intra operative cultures growing MSSA. ID onboard and recommended 6 weeks of IV ancef. Meanwhile trying to obtain dental consult for evaluation of dental caries/ teeth extraction. Called Dr Ross Marcus and left a message, waiting to hear. Hospital course complicated by right shoulder pain, CT shoulder done, showed severe osteoarthritis, Suspect small glenohumeral joint effusion,. IR consulted for joint aspiration to evaluate for septic arthritis.  Discussed the plan with the patient.    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  Source from ? sacral decubitus ulcer vs dental caries. Blood cultures from 06/14/2021 show MSSA and repeat cultures have been negative so far.   Patient remains on Ancef, ID on board. Recommendations to completed 6 weeks of ANCEF followed by suppression with cefadroxil.  Intra operative Cultures growing staph aureus sensitive to oxacillin.  Neurosurgery on board  and pt underwent anterior cervical decompression and  abscess evacuation with placement of screws.  TTE is negative for age vegetation.  Reached out to Cardiology for TEE, but as per Dr Alvera Novel, patient is not a candidate for TEE in view of his recent cervical spine surgery. Please reach out to cardiology prior to discharge to see if he isa a candidate for TEE as he is a difficult to place.  Pending Dental consult - please follow up with Dr Ross Marcus.  Pt remains afebrile and leukocytosis is improving.  Unfortunately pt is difficult to place in SNF. Will need to stay inpatient for the course of IV antibiotics.    Acute systolic congestive heart failure/ acute respiratory failure with hypoxia secondary to pulmonary edema. Patient reports sob, has tachypnea, requiring 2 lit of [place] oxygen on 06/27/21 CXR ordered, showed pulmonary edema, . Patient required 2 lit of [place] oxygen to keep sats greater than 90%. BNP is greater than 4500, troponins flat and minimally elevated.  He was started on IV lasix 40 mg BID, he has diuresed about 5 lit in the last 48 hours.  Continue with Strict in take and output, daily weights.  If he is adequately diuresed in the next 24 hours, recommend to transition to oral lasix 40 mg BID. He is weaned off oxygen.  Patient also non  compliant to fluid intake.  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 Patient will need ischemic evaluation after infection improved. Repeat CXR in am.   Tachycardia: EKG shows sinus tachy, with  t wave inversions in the lateral leads, with QT prolongation.  Probably from increased work of breathing. Improved.  Keep k>4 and Magnesium >2.   Severe protein calorie malnutrition Dietary on board.   Hypertension Blood pressure parameters are better controlled.    Right shoulder pain and right upper extremity swelling  CT shoulder shows Severe glenohumeral joint osteoarthritis. Suspect small  glenohumeral joint effusion, presumably reactive/degenerative. Septic arthritis is unable to be excluded by imaging. Moderate degenerative changes of the acromioclavicular joint. Right upper extremity swelling, with tenderness, Venous duplex of the RUE ordered to evaluate for DVT.    Hypokalemia, hypomagnesemia and hypophosphatemia:  Replaced, repeat levels wnl.    Anemia of chronic disease Continue to monitor. Normocytic.    Severe debility and frailty Therapy evaluations recommending SNF  Severe malnutrition.  Dietary on board to assist with supplementation.    Constipation:  - started him on bowel regimen.  - pt having regular BM's  Elevated troponin from demand ischemia from pulmonary edema and acute CHF.   Anemia of chronic disease:  Hemoglobin is stable around 9.   RN Pressure Injury Documentation: Pressure Injury 06/17/21 Sacrum Stage 3 -  Full thickness tissue loss. Subcutaneous fat may be visible but bone, tendon or muscle are NOT exposed. 2 in vertical by .5 in horizontal--approximately--wound tunneling and pink (Active)  06/17/21 1159  Location: Sacrum  Location Orientation:   Staging: Stage 3 -  Full thickness tissue loss. Subcutaneous fat may be visible but bone, tendon or muscle are NOT exposed.  Wound Description (Comments): 2 in vertical by .5 in horizontal--approximately--wound tunneling and pink  Present on Admission:   Dressing Type Foam - Lift dressing to assess site every shift 06/29/21 0900  Local wound care.   Malnutrition Type:  Nutrition Problem: Severe Malnutrition Etiology: chronic illness (h/o hernia)   Malnutrition Characteristics:  Signs/Symptoms: severe fat depletion, severe muscle depletion   Nutrition Interventions:  Interventions: Boost Breeze, MVI, Prostat, Refer to RD note for recommendations  Estimated body mass index is 24.95 kg/m as calculated from the following:   Height as of this encounter: 5\' 5"  (1.651 m).   Weight  as of this encounter: 68 kg.  Code Status: full code.  DVT Prophylaxis:  heparin injection 5,000 Units Start: 06/23/21 1715 SCDs Start: 06/17/21 1144   Level of Care: Level of care: Progressive Family Communication: none at bedside.   Disposition Plan:     Remains inpatient appropriate:  IV antibiotics. SNF placement.   Procedures:  anterior cervical decompression and arthrodesis for abscess at levels C4/5.by Dr Christella Noa on 5/17  Consultants:   Neuro surgery.  ID  IR  Dental consult.  Antimicrobials:   Anti-infectives (From admission, onward)    Start     Dose/Rate Route Frequency Ordered Stop   06/24/21 0200  ceFAZolin (ANCEF) IVPB 2g/100 mL premix        2 g 200 mL/hr over 30 Minutes Intravenous Every 8 hours 06/21/21 1058     06/23/21 1514  vancomycin (VANCOCIN) powder  Status:  Discontinued          As needed 06/23/21 1515 06/23/21 1524   06/17/21 1230  Ampicillin-Sulbactam (UNASYN) 3 g in sodium chloride 0.9 % 100 mL  IVPB        3 g 200 mL/hr over 30 Minutes Intravenous Every 6 hours 06/17/21 1219 06/23/21 2359   06/17/21 0430  Ampicillin-Sulbactam (UNASYN) 3 g in sodium chloride 0.9 % 100 mL IVPB        3 g 200 mL/hr over 30 Minutes Intravenous  Once 06/17/21 0407 06/17/21 0619   06/17/21 0415  metroNIDAZOLE (FLAGYL) IVPB 500 mg  Status:  Discontinued        500 mg 100 mL/hr over 60 Minutes Intravenous Every 12 hours 06/17/21 0407 06/17/21 1226        Medications  Scheduled Meds:  feeding supplement  1 Container Oral TID BM   furosemide  40 mg Intravenous BID   heparin injection (subcutaneous)  5,000 Units Subcutaneous Q8H   lidocaine  1 patch Transdermal Q24H   metoprolol tartrate  25 mg Oral BID   multivitamin with minerals  1 tablet Oral Daily   potassium chloride  40 mEq Oral BID   senna-docusate  2 tablet Oral BID   Continuous Infusions:  sodium chloride 10 mL/hr at 06/28/21 1830    ceFAZolin (ANCEF) IV 2 g (06/29/21 0858)   PRN Meds:.sodium  chloride, acetaminophen **OR** acetaminophen, bisacodyl, diazepam, hydrALAZINE, magnesium hydroxide, menthol-cetylpyridinium **OR** phenol, morphine injection, oxyCODONE, polyethylene glycol    Subjective:   Easten Sauber was seen and examined today. Patient reports sob has improved. Weaned off oxygen this am.  No chest pain . Overall pain is better.     Objective:   Vitals:   06/28/21 1950 06/29/21 0400 06/29/21 0833 06/29/21 1252  BP: (!) 153/104 108/83    Pulse: (!) 105 (!) 108 (!) 109 86  Resp: 18 18    Temp: 98 F (36.7 C) 98 F (36.7 C) 98.6 F (37 C) 98.2 F (36.8 C)  TempSrc: Oral Oral Oral Oral  SpO2: 100% 98% 95%   Weight:  68 kg    Height:        Intake/Output Summary (Last 24 hours) at 06/29/2021 1422 Last data filed at 06/29/2021 1300 Gross per 24 hour  Intake 120 ml  Output 3500 ml  Net -3380 ml    Filed Weights   06/18/21 0600 06/28/21 1243 06/29/21 0400  Weight: 67.3 kg 67.9 kg 68 kg     Exam General exam: Ill appearing gentleman, not in distress.  Respiratory system: diminished air entry at bases.  Cardiovascular system: S1 & S2 heard, tachycardic,  No pedal edema. Gastrointestinal system: Abdomen is nondistended, soft and nontender. Normal bowel sounds heard. Central nervous system: Alert and oriented. Grossly non focal.  Extremities: Right shoulder tenderness, RUE swollen.  Skin: sacral decubitus ulcer.  Psychiatry: Mood is appropriate.     Data Reviewed:  I have personally reviewed following labs and imaging studies   CBC Lab Results  Component Value Date   WBC 13.5 (H) 06/29/2021   RBC 3.36 (L) 06/29/2021   HGB 9.6 (L) 06/29/2021   HCT 31.7 (L) 06/29/2021   MCV 94.3 06/29/2021   MCH 28.6 06/29/2021   PLT 423 (H) 06/29/2021   MCHC 30.3 06/29/2021   RDW 18.3 (H) 06/29/2021   LYMPHSABS 2.5 06/29/2021   MONOABS 1.3 (H) 06/29/2021   EOSABS 0.1 06/29/2021   BASOSABS 0.1 [id number]     Last metabolic panel Lab Results   Component Value Date   NA 139 06/29/2021   K 4.5 06/29/2021   CL 108 06/29/2021   CO2 25 06/29/2021   BUN 13 06/29/2021  CREATININE 0.53 (L) 06/29/2021   GLUCOSE 103 (H) 06/29/2021   GFRNONAA >60 06/29/2021   GFRAA >60 01/23/2019   CALCIUM 8.7 (L) 06/29/2021   PHOS 3.0 06/29/2021   PROT 5.6 (L) 06/23/2021   ALBUMIN 2.3 (L) 06/23/2021   LABGLOB 2.6 04/13/2021   AGRATIO 1.0 04/13/2021   BILITOT 0.8 06/23/2021   ALKPHOS 72 06/23/2021   AST 20 06/23/2021   ALT 13 06/23/2021   ANIONGAP 6 06/29/2021    CBG (last 3)  No results for input(s): GLUCAP in the last 72 hours.    Coagulation Profile: No results for input(s): INR, PROTIME in the last 168 hours.   Radiology Studies: CT Angio Chest Pulmonary Embolism (PE) W or WO Contrast  Result Date: 06/28/2021 CLINICAL DATA:  Pulmonary embolism suspected with high probability. EXAM: CT ANGIOGRAPHY CHEST WITH CONTRAST TECHNIQUE: Multidetector CT imaging of the chest was performed using the standard protocol during bolus administration of intravenous contrast. Multiplanar CT image reconstructions and MIPs were obtained to evaluate the vascular anatomy. RADIATION DOSE REDUCTION: This exam was performed according to the departmental dose-optimization program which includes automated exposure control, adjustment of the mA and/or kV according to patient size and/or use of iterative reconstruction technique. CONTRAST:  16mL OMNIPAQUE IOHEXOL 350 MG/ML SOLN COMPARISON:  05/10/2021 FINDINGS: Cardiovascular: Satisfactory opacification of the pulmonary arteries to the segmental level. No evidence of pulmonary embolism. Diffuse cardiac enlargement. No pericardial effusion. Mediastinum/Nodes: Esophagus is decompressed. No significant lymphadenopathy. Thyroid gland is unremarkable. Diffuse soft tissue edema in the chest wall and mediastinum. Lungs/Pleura: Small bilateral pleural effusions. Atelectasis in the lung bases. Diffuse patchy and confluent areas of  airspace disease throughout both lungs with interstitial thickening. Changes have developed since the previous study and may represent edema or multifocal pneumonia. Diffuse peribronchial thickening. Upper Abdomen: Diffuse soft tissue edema suggesting anasarca. No focal acute abnormalities. Musculoskeletal: Degenerative changes in the spine and shoulders. Areas of bone sclerosis at the vertebral endplates likely representing degenerative change. Review of the MIP images confirms the above findings. IMPRESSION: 1. No evidence of significant pulmonary embolus. 2. Diffuse cardiac enlargement. 3. Bilateral pleural effusions with basilar atelectasis. 4. Interval progression of diffuse patchy and confluent airspace and interstitial disease in both lungs most likely representing edema although multifocal pneumonia could also have this appearance. Electronically Signed   By: Lucienne Capers M.D.   On: 06/28/2021 17:47   CT SHOULDER RIGHT WO CONTRAST  Result Date: 06/27/2021 CLINICAL DATA:  Septic arthritis suspected, shoulder, xray done EXAM: CT OF THE UPPER RIGHT EXTREMITY WITHOUT CONTRAST TECHNIQUE: Multidetector CT imaging of the upper right extremity was performed according to the standard protocol. RADIATION DOSE REDUCTION: This exam was performed according to the departmental dose-optimization program which includes automated exposure control, adjustment of the mA and/or kV according to patient size and/or use of iterative reconstruction technique. COMPARISON:  CT 05/10/2021 FINDINGS: Bones/Joint/Cartilage No acute fracture. No dislocation. Severe glenohumeral joint osteoarthritis manifested by joint space narrowing, subchondral sclerosis/cystic change, and marginal osteophyte formation. There are prominent subchondral cystic changes within the inferior glenoid and superior humeral head. Mild superior glenoid retroversion. Suspect small glenohumeral joint effusion. Moderate degenerative changes of the  acromioclavicular joint. No large subacromial-subdeltoid bursal fluid collection. Remaining visualized osseous structures appear grossly intact. Ligaments Suboptimally assessed by CT. Muscles and Tendons Preserved muscle bulk of the rotator cuff musculature without significant atrophy or fatty infiltration. Rotator cuff tendons appear grossly intact within the limitations of CT. Soft tissues Generalized anasarca. No evidence of a organized  fluid collection about the shoulder. IMPRESSION: 1. Severe glenohumeral joint osteoarthritis. Suspect small glenohumeral joint effusion, presumably reactive/degenerative. Septic arthritis is unable to be excluded by imaging. 2. Moderate degenerative changes of the acromioclavicular joint. 3. Generalized anasarca. Electronically Signed   By: Davina Poke D.O.   On: 06/27/2021 18:30   DG CHEST PORT 1 VIEW  Result Date: 06/28/2021 CLINICAL DATA:  56 year old male with shortness of breath. EXAM: PORTABLE CHEST 1 VIEW COMPARISON:  Chest radiographs 06/17/2021 and earlier. FINDINGS: Portable AP semi upright view at 1043 hours. Increased bilateral pulmonary interstitial opacity, diffuse and slightly greater on the right. Underlying cardiomegaly again noted. Stable cardiac size and mediastinal contours. Visualized tracheal air column is within normal limits. No pneumothorax. No pleural effusion or air bronchograms identified. No acute osseous abnormality identified. Chronic severe degeneration at the right glenohumeral joint. IMPRESSION: Evidence of acute pulmonary edema superimposed on cardiomegaly. Edema perhaps asymmetrically greater on the right. Viral/atypical respiratory infection felt less likely. No pleural effusion or other acute cardiopulmonary abnormality identified. Electronically Signed   By: Genevie Ann M.D.   On: 06/28/2021 10:54   VAS [name] UPPER EXTREMITY VENOUS DUPLEX  Result Date: 06/28/2021 UPPER VENOUS STUDY  Patient Name:  TEDFORD MCGOWEN  Date of Exam:    06/28/2021 Medical Rec #: [id number]             Accession #:    [id number] Date of Birth: [birth year]              Patient Gender: M Patient Age:   83 years Exam Location:  [facility] Procedure:      VAS [name] UPPER EXTREMITY VENOUS DUPLEX Referring Phys: Hosie Poisson --------------------------------------------------------------------------------  Indications: Edema Limitations: Edema, bandages, line. Comparison Study: Prior negative right upper extremity venous duplex done 06/11/21 Performing Technologist: Sharion Dove RVS  Examination Guidelines: A complete evaluation includes B-mode imaging, spectral Doppler, color Doppler, and power Doppler as needed of all accessible portions of each vessel. Bilateral testing is considered an integral part of a complete examination. Limited examinations for reoccurring indications may be performed as noted.  Right Findings: +----------+------------+---------+-----------+----------+-------+ RIGHT     CompressiblePhasicitySpontaneousPropertiesSummary +----------+------------+---------+-----------+----------+-------+ IJV           Full       Yes       Yes                      +----------+------------+---------+-----------+----------+-------+ Subclavian               Yes       Yes                      +----------+------------+---------+-----------+----------+-------+ Axillary      Full       Yes       Yes                      +----------+------------+---------+-----------+----------+-------+ Brachial      Full                                          +----------+------------+---------+-----------+----------+-------+ Radial        Full                                          +----------+------------+---------+-----------+----------+-------+  Ulnar         Full                                          +----------+------------+---------+-----------+----------+-------+ Cephalic      Full                                           +----------+------------+---------+-----------+----------+-------+ Basilic                  Yes       Yes                      +----------+------------+---------+-----------+----------+-------+  Left Findings: +----------+------------+---------+-----------+----------+-------+ LEFT      CompressiblePhasicitySpontaneousPropertiesSummary +----------+------------+---------+-----------+----------+-------+ Subclavian               Yes       Yes                      +----------+------------+---------+-----------+----------+-------+  Summary:  Right: No evidence of deep vein thrombosis in the upper extremity. No evidence of superficial vein thrombosis in the upper extremity. No change since study done 06/11/21  Left: No evidence of thrombosis in the subclavian.  *See table(s) above for measurements and observations.  Diagnosing physician: Monica Martinez MD Electronically signed by Monica Martinez MD on 06/28/2021 at 5:17:26 PM.    Final        Hosie Poisson M.D. [facility] 06/29/2021, 2:22 PM  Available via Epic secure chat 7am-7pm After 7 pm, please refer to night coverage provider listed on amion.   ADDENDUM; Notified by RN that patient went into atrial flutter with rates in 120 to 150's/min.  EKG shows Atrial flutter/ atrial fib.  IV metoprolol 5 mg ordered.  Rate currently in 100's/min. His [place] 2 VASC Score is 2 for heart failure and hypertension. Requested cardiology consultation to see if he needs to be on anti coagulation especially in view of his recent cervical surgery.    Valera Castle MD

## 2021-06-29 NOTE — Progress Notes (Signed)
Mobility Specialist Progress Note:   06/29/21 1400  Mobility  Activity Ambulated with assistance in hallway  Level of Assistance Standby assist, set-up cues, supervision of patient - no hands on  Assistive Device Front wheel walker  Distance Ambulated (ft) 500 ft  Activity Response Tolerated well  $Mobility charge 1 Mobility   Pt received in bed willing to participate in mobility. Complaints of back pain. Left in bed with call bell in reach and all needs met.   [facility] Public librarian Phone [phone]

## 2021-06-30 ENCOUNTER — Inpatient Hospital Stay (HOSPITAL_COMMUNITY): Payer: Medicaid Other

## 2021-06-30 DIAGNOSIS — I5021 Acute systolic (congestive) heart failure: Secondary | ICD-10-CM | POA: Diagnosis not present

## 2021-06-30 DIAGNOSIS — B9561 Methicillin susceptible Staphylococcus aureus infection as the cause of diseases classified elsewhere: Secondary | ICD-10-CM | POA: Diagnosis not present

## 2021-06-30 DIAGNOSIS — M00011 Staphylococcal arthritis, right shoulder: Secondary | ICD-10-CM | POA: Diagnosis not present

## 2021-06-30 DIAGNOSIS — L89302 Pressure ulcer of unspecified buttock, stage 2: Secondary | ICD-10-CM | POA: Diagnosis not present

## 2021-06-30 DIAGNOSIS — G061 Intraspinal abscess and granuloma: Secondary | ICD-10-CM | POA: Diagnosis not present

## 2021-06-30 DIAGNOSIS — R7881 Bacteremia: Secondary | ICD-10-CM | POA: Diagnosis not present

## 2021-06-30 LAB — CBC WITH DIFFERENTIAL/PLATELET
Abs Immature Granulocytes: 0.04 10*3/uL (ref 0.00–0.07)
Basophils Absolute: 0.1 10*3/uL (ref 0.0–0.1)
Basophils Relative: 1 %
Eosinophils Absolute: 0.2 10*3/uL (ref 0.0–0.5)
Eosinophils Relative: 2 %
HCT: 28.2 % — ABNORMAL LOW (ref 39.0–52.0)
Hemoglobin: 8.5 g/dL — ABNORMAL LOW (ref 13.0–17.0)
Immature Granulocytes: 0 %
Lymphocytes Relative: 29 %
Lymphs Abs: 3 10*3/uL (ref 0.7–4.0)
MCH: 29 pg (ref 26.0–34.0)
MCHC: 30.1 g/dL (ref 30.0–36.0)
MCV: 96.2 fL (ref 80.0–100.0)
Monocytes Absolute: 1.4 10*3/uL — ABNORMAL HIGH (ref 0.1–1.0)
Monocytes Relative: 13 %
Neutro Abs: 5.5 10*3/uL (ref 1.7–7.7)
Neutrophils Relative %: 55 %
Platelets: 352 10*3/uL (ref 150–400)
RBC: 2.93 MIL/uL — ABNORMAL LOW (ref 4.22–5.81)
RDW: 18.4 % — ABNORMAL HIGH (ref 11.5–15.5)
WBC: 10.1 10*3/uL (ref 4.0–10.5)
nRBC: 0 % (ref 0.0–0.2)

## 2021-06-30 LAB — BASIC METABOLIC PANEL
Anion gap: 5 (ref 5–15)
BUN: 10 mg/dL (ref 6–20)
CO2: 26 mmol/L (ref 22–32)
Calcium: 8.3 mg/dL — ABNORMAL LOW (ref 8.9–10.3)
Chloride: 107 mmol/L (ref 98–111)
Creatinine, Ser: 0.48 mg/dL — ABNORMAL LOW (ref 0.61–1.24)
GFR, Estimated: >60 mL/min (ref >60)
Glucose, Bld: 92 mg/dL (ref 70–99)
Potassium: 4 mmol/L (ref 3.5–5.1)
Sodium: 138 mmol/L (ref 135–145)

## 2021-06-30 LAB — MAGNESIUM: Magnesium: 1.7 mg/dL (ref 1.7–2.4)

## 2021-06-30 LAB — PHOSPHORUS: Phosphorus: 2.6 mg/dL (ref 2.5–4.6)

## 2021-06-30 MED ORDER — SPIRONOLACTONE 25 MG PO TABS
25.0000 mg | ORAL_TABLET | Freq: Every day | ORAL | Status: DC
  Administered 2021-06-30 – 2021-07-05 (×6): 25 mg via ORAL
  Filled 2021-06-30 (×6): qty 1

## 2021-06-30 MED ORDER — PROSOURCE PLUS PO LIQD
30.0000 mL | Freq: Two times a day (BID) | ORAL | Status: DC
  Administered 2021-06-30 – 2021-07-05 (×9): 30 mL via ORAL
  Filled 2021-06-30 (×10): qty 30

## 2021-06-30 MED ORDER — MAGNESIUM SULFATE 4 GM/100ML IV SOLN
4.0000 g | Freq: Once | INTRAVENOUS | Status: AC
  Administered 2021-06-30: 4 g via INTRAVENOUS
  Filled 2021-06-30 (×2): qty 100

## 2021-06-30 NOTE — Progress Notes (Signed)
Mobility Specialist Progress Note:   06/30/21 1433  Mobility  Activity Ambulated with assistance in hallway  Level of Assistance Modified independent, requires aide device or extra time  Assistive Device Front wheel walker  Distance Ambulated (ft) 600 ft  Activity Response Tolerated well  $Mobility charge 1 Mobility   Pt received in bed willing to participate in mobility. No complaints of pain. Left in bed with call bell in reach and all needs met.   [facility] Public librarian Phone [phone]

## 2021-06-30 NOTE — Progress Notes (Signed)
Nutrition Follow-up  DOCUMENTATION CODES:   Severe malnutrition in context of chronic illness  INTERVENTION:  Continue Boost Breeze po TID, each supplement provides 250 kcal and 9 grams of protein  Provide 30 ml Prosource plus po BID, each supplement provides 100 kcal and 15 grams of protein.   Encourage adequate PO intake.   NUTRITION DIAGNOSIS:   Severe Malnutrition related to chronic illness (h/o hernia) as evidenced by severe fat depletion, severe muscle depletion; ongoing  GOAL:   Patient will meet greater than or equal to 90% of their needs; progressing  MONITOR:   PO intake, Supplement acceptance, Labs, Weight trends, Skin, I & O's  REASON FOR ASSESSMENT:   Malnutrition Screening Tool    ASSESSMENT:   Pt with recent hospitalization for incarcerated abdominal hernia with primary repair but left AMA prior to rehab placement. He was subsequently admitted with severe debility, malnutrition, and FTT from 4/3-5/10.  During the hospitalization, he developed MSSA bacteremia and sepsis and ID was following with plan for MRI of the spine and sacrum to r/o osteo.  He was also noted to have worsening EF.  He was upset about being NPO for multiple MRI scans with sedation and left AMA.  He returned within a few hours due to weakness and back pain. He reports that he walked out to the bus stop and took a misstep and fell. Taken to OR 5/17 for abscess drainage by neurosurgery.   Meal completion has been 100%. Pt currently has a good appetite with no difficulties. Pt currently has Boost Breeze ordered and has been consuming them. RD to additionally order Prosource plus to aid in increased protein needs. Pt encouraged to eat his food at meals and to drink his supplements.   Labs and medications reviewed.   Diet Order:   Diet Order             Diet regular Room service appropriate? Yes; Fluid consistency: Thin  Diet effective now                   EDUCATION NEEDS:   Education  needs have been addressed  Skin:  Skin Assessment: Reviewed RN Assessment Skin Integrity Issues:: Stage III Stage III: per WOC: sacral wound (2.5 cm x 1.9 cm x 2 cm) has greatly improved from previous assessment on 06/07/21  Last BM:  5/23  Height:   Ht Readings from Last 1 Encounters:  06/28/21 5\' 5"  (1.651 m)    Weight:   Wt Readings from Last 1 Encounters:  06/30/21 65.4 kg   BMI:  Body mass index is 23.99 kg/m.  Estimated Nutritional Needs:   Kcal:  2100-2300  Protein:  100-115g  Fluid:  >/=2L  Corrin Parker, MS, RD, LDN RD pager number/after hours weekend pager number on Amion.

## 2021-06-30 NOTE — Progress Notes (Addendum)
[facility]  Date of Admission:  06/16/2021           Reason for visit: Follow up on MSSA Bacteremia   Current antibiotics: Cefazolin   ASSESSMENT:    56 y.o. male admitted with:  MSSA bacteremia: Complicated by [id number] epidural abscess with discitis/OM.  Status post anterior cervical decompression and abscess evacuation with instrumentation on 06/23/21.  OR cultures grew MSSA. Currently on Cefazolin.  Per cardiology, patient is high risk for TEE and so this was not been obtained although unlikely to change antibiotic plan at this juncture. Infected sacral decubitus ulcer: Completed a course of Unasyn x 10 days and now on Cefazolin for problem #1.  Right shoulder pain and UE swelling: CT shoulder shows severe glenohumeral joint osteoarthritis. Suspect small glenohumeral joint effusion, presumably reactive/degenerative. Septic arthritis is unable to be excluded by imaging.  Venous duplex to assess his swelling was negative for DVT.  Acute HFrEF:  TTE 06/15/21 showed LVEF 30-35%, global hypokinesis.  Diuresis per primary team.   RECOMMENDATIONS:    Continue cefazolin Plan for aspiration of right shoulder to exclude septic arthritis currently on hold as symptoms seem more related to wrist and forearm swelling.  Duplex was negative for DVT Wound care Lab monitoring Will follow   Principal Problem:   MSSA bacteremia Active Problems:   Hypertension   Protein-calorie malnutrition, severe   Anemia of chronic disease   Generalized weakness   Pressure injury of buttock, stage 2 (HCC)   Acute systolic (congestive) heart failure (HCC)   Prolonged QT interval   Epidural abscess   Abscess in epidural space of cervical spine    MEDICATIONS:    Scheduled Meds:  feeding supplement  1 Container Oral TID BM   furosemide  40 mg Intravenous BID   heparin injection (subcutaneous)  5,000 Units Subcutaneous Q8H   lidocaine  1 patch Transdermal Q24H   metoprolol  tartrate  50 mg Oral BID   multivitamin with minerals  1 tablet Oral Daily   potassium chloride  40 mEq Oral BID   senna-docusate  2 tablet Oral BID   Continuous Infusions:  sodium chloride 10 mL/hr at 06/28/21 1830    ceFAZolin (ANCEF) IV 2 g (06/30/21 0309)   PRN Meds:.sodium chloride, acetaminophen **OR** acetaminophen, bisacodyl, diazepam, hydrALAZINE, magnesium hydroxide, menthol-cetylpyridinium **OR** phenol, morphine injection, oxyCODONE, polyethylene glycol  SUBJECTIVE:   24 hour events:  No acute events noted PT recommending SNF Tmax 98.8 Right UE duplex negative for DVT  He reports right arm swelling.  Particularly at the wrist.  Right shoulder ROM is still limited.  He is feeling depressed.   Review of Systems  All other systems reviewed and are negative.    OBJECTIVE:   Blood pressure (!) 133/100, pulse 89, temperature 97.9 F (36.6 C), temperature source Oral, resp. rate (!) 24, height 5\' 5"  (1.651 m), weight 65.4 kg, SpO2 91 %. Body mass index is 23.99 kg/m.  Physical Exam Constitutional:      General: He is not in acute distress.    Appearance: Normal appearance.  HENT:     Head: Normocephalic and atraumatic.     Mouth/Throat:     Comments: Dentition is poor.  Eyes:     Extraocular Movements: Extraocular movements intact.     Conjunctiva/sclera: Conjunctivae normal.  Pulmonary:     Effort: Pulmonary effort is normal. No respiratory distress.  Abdominal:     General: There is no distension.  Palpations: Abdomen is soft.     Tenderness: There is no abdominal tenderness.  Musculoskeletal:     Cervical back: Normal range of motion and neck supple.     Comments: Right shoulder ROM limited due to discomfort. Right wrist and forearm edematous. PIV site without erythema or drainage.   Skin:    General: Skin is warm and dry.     Findings: No rash.  Neurological:     General: No focal deficit present.     Mental Status: He is alert and oriented to  person, place, and time.  Psychiatric:        Mood and Affect: Mood normal.        Behavior: Behavior normal.     Lab Results: Lab Results  Component Value Date   WBC 10.1 06/30/2021   HGB 8.5 (L) 06/30/2021   HCT 28.2 (L) 06/30/2021   MCV 96.2 06/30/2021   PLT 352 06/30/2021    Lab Results  Component Value Date   NA 138 06/30/2021   K 4.0 06/30/2021   CO2 26 06/30/2021   GLUCOSE 92 06/30/2021   BUN 10 06/30/2021   CREATININE 0.48 (L) 06/30/2021   CALCIUM 8.3 (L) 06/30/2021   GFRNONAA >60 06/30/2021   GFRAA >60 01/23/2019    Lab Results  Component Value Date   ALT 13 06/23/2021   AST 20 06/23/2021   ALKPHOS 72 06/23/2021   BILITOT 0.8 06/23/2021       Component Value Date/Time   CRP 15.5 (H) 06/14/2021 1655       Component Value Date/Time   ESRSEDRATE 30 (H) 06/14/2021 1655     I have reviewed the micro and lab results in Epic.  Imaging: CT Angio Chest Pulmonary Embolism (PE) W or WO Contrast  Result Date: 06/28/2021 CLINICAL DATA:  Pulmonary embolism suspected with high probability. EXAM: CT ANGIOGRAPHY CHEST WITH CONTRAST TECHNIQUE: Multidetector CT imaging of the chest was performed using the standard protocol during bolus administration of intravenous contrast. Multiplanar CT image reconstructions and MIPs were obtained to evaluate the vascular anatomy. RADIATION DOSE REDUCTION: This exam was performed according to the departmental dose-optimization program which includes automated exposure control, adjustment of the mA and/or kV according to patient size and/or use of iterative reconstruction technique. CONTRAST:  83mL OMNIPAQUE IOHEXOL 350 MG/ML SOLN COMPARISON:  05/10/2021 FINDINGS: Cardiovascular: Satisfactory opacification of the pulmonary arteries to the segmental level. No evidence of pulmonary embolism. Diffuse cardiac enlargement. No pericardial effusion. Mediastinum/Nodes: Esophagus is decompressed. No significant lymphadenopathy. Thyroid gland is  unremarkable. Diffuse soft tissue edema in the chest wall and mediastinum. Lungs/Pleura: Small bilateral pleural effusions. Atelectasis in the lung bases. Diffuse patchy and confluent areas of airspace disease throughout both lungs with interstitial thickening. Changes have developed since the previous study and may represent edema or multifocal pneumonia. Diffuse peribronchial thickening. Upper Abdomen: Diffuse soft tissue edema suggesting anasarca. No focal acute abnormalities. Musculoskeletal: Degenerative changes in the spine and shoulders. Areas of bone sclerosis at the vertebral endplates likely representing degenerative change. Review of the MIP images confirms the above findings. IMPRESSION: 1. No evidence of significant pulmonary embolus. 2. Diffuse cardiac enlargement. 3. Bilateral pleural effusions with basilar atelectasis. 4. Interval progression of diffuse patchy and confluent airspace and interstitial disease in both lungs most likely representing edema although multifocal pneumonia could also have this appearance. Electronically Signed   By: Lucienne Capers M.D.   On: 06/28/2021 17:47   DG CHEST PORT 1 VIEW  Result Date: 06/28/2021 CLINICAL  DATA:  56 year old male with shortness of breath. EXAM: PORTABLE CHEST 1 VIEW COMPARISON:  Chest radiographs 06/17/2021 and earlier. FINDINGS: Portable AP semi upright view at 1043 hours. Increased bilateral pulmonary interstitial opacity, diffuse and slightly greater on the right. Underlying cardiomegaly again noted. Stable cardiac size and mediastinal contours. Visualized tracheal air column is within normal limits. No pneumothorax. No pleural effusion or air bronchograms identified. No acute osseous abnormality identified. Chronic severe degeneration at the right glenohumeral joint. IMPRESSION: Evidence of acute pulmonary edema superimposed on cardiomegaly. Edema perhaps asymmetrically greater on the right. Viral/atypical respiratory infection felt less  likely. No pleural effusion or other acute cardiopulmonary abnormality identified. Electronically Signed   By: Genevie Ann M.D.   On: 06/28/2021 10:54   VAS [name] UPPER EXTREMITY VENOUS DUPLEX  Result Date: 06/28/2021 UPPER VENOUS STUDY  Patient Name:  [facility]  Date of Exam:   06/28/2021 Medical Rec #: [id number]             Accession #:    [id number] Date of Birth: [birth year]              Patient Gender: M Patient Age:   70 years Exam Location:  [facility] Procedure:      VAS [name] UPPER EXTREMITY VENOUS DUPLEX Referring Phys: Hosie Poisson --------------------------------------------------------------------------------  Indications: Edema Limitations: Edema, bandages, line. Comparison Study: Prior negative right upper extremity venous duplex done 06/11/21 Performing Technologist: Sharion Dove RVS  Examination Guidelines: A complete evaluation includes B-mode imaging, spectral Doppler, color Doppler, and power Doppler as needed of all accessible portions of each vessel. Bilateral testing is considered an integral part of a complete examination. Limited examinations for reoccurring indications may be performed as noted.  Right Findings: +----------+------------+---------+-----------+----------+-------+ RIGHT     CompressiblePhasicitySpontaneousPropertiesSummary +----------+------------+---------+-----------+----------+-------+ IJV           Full       Yes       Yes                      +----------+------------+---------+-----------+----------+-------+ Subclavian               Yes       Yes                      +----------+------------+---------+-----------+----------+-------+ Axillary      Full       Yes       Yes                      +----------+------------+---------+-----------+----------+-------+ Brachial      Full                                          +----------+------------+---------+-----------+----------+-------+ Radial        Full                                           +----------+------------+---------+-----------+----------+-------+ Ulnar         Full                                          +----------+------------+---------+-----------+----------+-------+ Cephalic  Full                                          +----------+------------+---------+-----------+----------+-------+ Basilic                  Yes       Yes                      +----------+------------+---------+-----------+----------+-------+  Left Findings: +----------+------------+---------+-----------+----------+-------+ LEFT      CompressiblePhasicitySpontaneousPropertiesSummary +----------+------------+---------+-----------+----------+-------+ Subclavian               Yes       Yes                      +----------+------------+---------+-----------+----------+-------+  Summary:  Right: No evidence of deep vein thrombosis in the upper extremity. No evidence of superficial vein thrombosis in the upper extremity. No change since study done 06/11/21  Left: No evidence of thrombosis in the subclavian.  *See table(s) above for measurements and observations.  Diagnosing physician: Monica Martinez MD Electronically signed by Monica Martinez MD on 06/28/2021 at 5:17:26 PM.    Final    [name] EKG SITE RITE  Result Date: 06/29/2021 If Site Rite image not attached, placement could not be confirmed due to current cardiac rhythm.    Imaging independently reviewed in Epic.    Rayn[facility] [phone] pager 06/30/2021, 8:23 AM

## 2021-06-30 NOTE — Consult Note (Addendum)
Cardiology Consultation:   Patient ID: Logan Nelson MRN: [id number]; DOB: 11-May-1965  Admit date: 06/16/2021 Date of Consult: 06/30/2021  PCP:  [name], MD   [facility] Providers Cardiologist:  Early Osmond, MD        Patient Profile:   Logan Nelson is a 56 y.o. male with a hx of bradycardia, hypertension, abdominal wall hernias, GERD, polysubstance abuse (cocaine, smoking), and B12 deficiency  who is being seen 06/30/2021 for the evaluation of new onset atrial fibrillation at the request of Dr. Broadus John.  History of Present Illness:   Mr. Bleakley established cardiology care with Dr. Ali Lowe 12/22/2020 with atypical chest pain and dyspnea on exertion.  He complained of hernia pain daily.  Given his nonspecific complaints, he underwent dobutamine stress echo which was unremarkable.   He was admitted 04/11/2021 with abdominal pain found to have chronically incarcerated ventral and inguinal hernias.  He was found to be not malnourished related to partial obstruction of these hernias.  However UDS also positive for benzodiazepines, cocaine, and THC.  He was discharged with plans to improve nutrition and to undergo outpatient surgical intervention.  However he continued to have intolerance to p.o. intake.  He was admitted again 04/23/2021 for hernia repair.  He successfully completed open repair, but left AMA prior to being placed in rehab.  He presented back on 05/11/2021 with worsening weakness, severe debility, malnutrition, and failure to thrive.  He did not have help at home and was unable to walk around his house.  He had frequent falls and could not get out of the floor.  He developed MSSA bacteremia and sepsis during his second hospitalization.  Cardiology was contacted for TEE, however due to retropharyngeal abscess, this could not be completed.  Echocardiogram with worsening EF, as below.    Echocardiogram 06/15/2021 revealed a mobile filamentous structure in the RA  suspicious for Chiari network that was present on echo from March 2023, no lesions suggestive of endocarditis, LVEF was estimated at 30 to 35% with hyper trabeculation with recommendations for cardiac MRI to exclude noncompaction, mildly reduced RV function, normal PA pressure, severely dilated left atrium, and mild to moderate MR (prior study demonstrated possible moderate to severe MR).   Infectious disease was following with plans for MRI of the spine and sacrum to rule out osteomyelitis.  Patient was upset about being n.p.o. for multiple MRI scans with sedation and left AMA again.  He returned within a few hours due to weakness and back pain.  He reports that he was able to walk to the bus stop but fell before getting on the bus.  He was readmitted again 06/17/2021 stating he now understands the importance of treatment and will stay in the hospital.  During this hospitalization, he underwent anterior cervical decompression and epidural abscess evacuation with arthrodesis C4-5 on 06/23/2021.  On 06/24/2021 we were again asked for TEE, however it was recommended that the patient recover from his spinal surgery before engaging in another procedure.  OR cultures growing Staph aureus, ID following.  Following cervical spine surgery, he has received targeted doses of IV Lasix.  On 06/28/2021 patient was noted to have labored breathing and transferred to [place].  On 06/29/2021 patient noted shortness of breath and was tachypneic requiring 2L [place] O2.  Labs remarkable for: Mg 1.6, K 4.5 HS troponin 57 --> 55 BNP > 4500 sCr 0.53  He was started on 40 mg IV Lasix twice daily with good diuresis. He also  had right shoulder pain and swelling.  Venous duplex negative for DVT.  CTA ruled out PE, but did show a interstitial disease suspicious for edema, but cannot rule out pneumonia.  Last evening he converted to coarse A-fib versus atrial flutter with variable block with heart rates in the 110s.  Cardiology was  consulted.    Telemetry reviewed and shows periods of sinus rhythm, Afib RVR and flutter with HR in the 130s, he is currently in sinus rhythm in the 80s. He states that he felt very short of breath yesterday evening when in Afib/flutter. He is now lying flat and breathing comfortable, off of O2. His main complaint is right hand and arm swelling.   He has transitioned to increased lasix of 40 mg IV BID. Metoprolol has been increased to 50 mg BID.   Mg has improved to 1.7 --> will give an additional 4 g IV.  He reports that he has stopped using cocaine and cigarettes. He denies recent/new chest pain. He has "regular daily pain."   Past Medical History:  Diagnosis Date   Anemia    Arthritis    Bradycardia    Dyspnea    Falling episodes    GERD (gastroesophageal reflux disease)    Hernia of abdominal wall    Hypertension    Lower extremity edema     Past Surgical History:  Procedure Laterality Date   ABDOMINAL SURGERY     ANTERIOR CERVICAL DECOMP/DISCECTOMY FUSION N/A 06/23/2021   Procedure: Anterior cervical Decompression and epidural Abcess evacuation with arthodesis cervical Four -five;  Surgeon: Ashok Pall, MD;  Location: [place];  Service: Neurosurgery;  Laterality: N/A;   INGUINAL HERNIA REPAIR Right 04/30/2021   Procedure: HERNIA REPAIR INGUINAL WITH MESH;  Surgeon: Stechschulte, Nickola Major, MD;  Location: [place];  Service: General;  Laterality: Right;   LYSIS OF ADHESION  04/30/2021   Procedure: LYSIS OF ADHESION;  Surgeon: Felicie Morn, MD;  Location: [place];  Service: General;;   RADIOLOGY WITH ANESTHESIA N/A 06/16/2021   Procedure: MRI CERVICAL THORACIC LUMBER AND SACRUM;  Surgeon: Radiologist, Medication, MD;  Location: [place];  Service: Radiology;  Laterality: N/A;   RADIOLOGY WITH ANESTHESIA N/A 06/18/2021   Procedure: MRI WITH ANESTHESIA;  Surgeon: Radiologist, Medication, MD;  Location: [place];  Service: Radiology;  Laterality: N/A;   RADIOLOGY WITH ANESTHESIA N/A  06/20/2021   Procedure: MRI WITH ANESTHESIA;  Surgeon: Radiologist, Medication, MD;  Location: [place];  Service: Radiology;  Laterality: N/A;   UMBILICAL HERNIA REPAIR N/A 04/30/2021   Procedure: OPEN UMBILICAL AND EPIGASTRIC HERNIA REPAIR;  Surgeon: Felicie Morn, MD;  Location: [place];  Service: General;  Laterality: N/A;     Home Medications:  Prior to Admission medications   Not on File    Inpatient Medications: Scheduled Meds:  feeding supplement  1 Container Oral TID BM   furosemide  40 mg Intravenous BID   heparin injection (subcutaneous)  5,000 Units Subcutaneous Q8H   lidocaine  1 patch Transdermal Q24H   metoprolol tartrate  50 mg Oral BID   multivitamin with minerals  1 tablet Oral Daily   potassium chloride  40 mEq Oral BID   senna-docusate  2 tablet Oral BID   Continuous Infusions:  sodium chloride 10 mL/hr at 06/28/21 1830    ceFAZolin (ANCEF) IV 2 g (06/30/21 0904)   magnesium sulfate bolus IVPB     PRN Meds: sodium chloride, acetaminophen **OR** acetaminophen, bisacodyl, diazepam, hydrALAZINE, magnesium hydroxide, menthol-cetylpyridinium **OR**  phenol, morphine injection, oxyCODONE, polyethylene glycol  Allergies:   No Known Allergies  Social History:   Social History   Socioeconomic History   Marital status: Single    Spouse name: Not on file   Number of children: Not on file   Years of education: Not on file   Highest education level: Not on file  Occupational History   Not on file  Tobacco Use   Smoking status: Former    Types: Cigarettes    Quit date: 04/28/2020    Years since quitting: 1.1   Smokeless tobacco: Never  Vaping Use   Vaping Use: Never used  Substance and Sexual Activity   Alcohol use: Not Currently   Drug use: Not Currently   Sexual activity: Not Currently  Other Topics Concern   Not on file  Social History Narrative   ** Merged History Encounter **       Social Determinants of Health   Financial Resource Strain: Not  on file  Food Insecurity: Not on file  Transportation Needs: Not on file  Physical Activity: Not on file  Stress: Not on file  Social Connections: Not on file  Intimate Partner Violence: Not on file    Family History:   History reviewed. No pertinent family history.   ROS:  Please see the history of present illness.   All other ROS reviewed and negative.     Physical Exam/Data:   Vitals:   06/29/21 1957 06/30/21 0111 06/30/21 0300 06/30/21 0730  BP: 115/81 (!) 118/95 (!) 118/97 (!) 133/100  Pulse: (!) 107 90 87 89  Resp: 20 15 20  (!) 24  Temp: 98.8 F (37.1 C) 98.3 F (36.8 C) 98.1 F (36.7 C) 97.9 F (36.6 C)  TempSrc: Oral Oral Oral Oral  SpO2: 99% 98% 97% 91%  Weight:  65.4 kg    Height:        Intake/Output Summary (Last 24 hours) at 06/30/2021 1140 Last data filed at 06/30/2021 0816 Gross per 24 hour  Intake 1060 ml  Output 1800 ml  Net -740 ml      06/30/2021    1:11 AM 06/29/2021    4:00 AM 06/28/2021   12:43 PM  Last 3 Weights  Weight (lbs) 144 lb 2.9 oz 149 lb 14.6 oz 149 lb 11.1 oz  Weight (kg) 65.4 kg 68 kg 67.9 kg     Body mass index is 23.99 kg/m.  General:  Well nourished, well developed, in no acute distress HEENT: normal Neck: + JVD Vascular: No carotid bruits; Distal pulses 2+ bilaterally Cardiac:  normal S1, S2; RRR; no murmur  Lungs:  crackles throughout while lying flat Abd: soft, nontender, no hepatomegaly  Ext: minimal LE edema, right arm and hand swelling, right radial pulse intact, right UE warm and dry Musculoskeletal:  No deformities, BUE and BLE strength normal and equal Skin: warm and dry  Neuro:  CNs 2-12 intact, no focal abnormalities noted Psych:  Normal affect   EKG:  The EKG was personally reviewed and demonstrates: Coarse atrial fibrillation versus atrial flutter with variable block with ventricular rate 113 Telemetry:  Telemetry was personally reviewed and demonstrates:  sinus rhythm --> Afib / flutter 110-130s --> sinus  in the 80s  Relevant CV Studies:  Echo 06/15/21:  1. There is a mobile filamentous structure in the RA. 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 No valvular lesions are present to suggest endocarditis.  Of note, this structure was present on the prior echo  from March 2023.   2. LV myocardium is hyper trabeculated. Consider contrast echo or MRI to  exclude noncompaction. Left ventricular ejection fraction, by estimation,  is 30 to 35%. The left ventricle has moderately decreased function. The  left ventricle demonstrates global   hypokinesis. Indeterminate diastolic filling due to E-A fusion.   3. Right ventricular systolic function is low normal. The right  ventricular size is normal. There is normal pulmonary artery systolic  pressure. The estimated right ventricular systolic pressure is [id number] mmHg.   4. Left atrial size was severely dilated.   5. Mild to moderate mitral regurgitation is present on this study. The  prior study demonstrated possible moderate to severe MR. Would recommend  TEE for clarification. The mitral valve is grossly normal. Mild to  moderate mitral valve regurgitation. No  evidence of mitral stenosis.   6. The aortic valve is tricuspid. There is mild calcification of the  aortic valve. Aortic valve regurgitation is not visualized. Aortic valve  sclerosis is present, with no evidence of aortic valve stenosis.   7. The inferior vena cava is normal in size with greater than 50%  respiratory variability, suggesting right atrial pressure of 3 mmHg.  Laboratory Data:  High Sensitivity Troponin:   Recent Labs  Lab 06/28/21 1034 06/28/21 1236  TROPONINIHS 57* 55*     Chemistry Recent Labs  Lab 06/27/21 0055 06/28/21 1034 06/29/21 0152 06/30/21 0340  NA 141 138 139 138  K 3.3* 4.0 4.5 4.0  CL 110 106 108 107  CO2 27 26 25 26   GLUCOSE 119* 120* 103* 92  BUN 10 11 13 10   CREATININE 0.40* 0.45* 0.53* 0.48*  CALCIUM 8.2* 8.9 8.7* 8.3*  MG 1.6*  --  1.8 1.7   GFRNONAA >60 >60 >60 >60  ANIONGAP 4* 6 6 5     No results for input(s): PROT, ALBUMIN, AST, ALT, ALKPHOS, BILITOT in the last 168 hours. Lipids No results for input(s): CHOL, TRIG, HDL, LABVLDL, LDLCALC, CHOLHDL in the last 168 hours.  Hematology Recent Labs  Lab 06/27/21 0055 06/29/21 0152 06/30/21 0340  WBC 9.7 13.5* 10.1  RBC 3.02* 3.36* 2.93*  HGB 8.8* 9.6* 8.5*  HCT 28.8* 31.7* 28.2*  MCV 95.4 94.3 96.2  MCH 29.1 28.6 29.0  MCHC 30.6 30.3 30.1  RDW 17.9* 18.3* 18.4*  PLT 373 423* 352   Thyroid No results for input(s): TSH, FREET4 in the last 168 hours.  BNP Recent Labs  Lab 06/28/21 1236  BNP >4,500.0*    DDimer No results for input(s): DDIMER in the last 168 hours.   Radiology/Studies:  CT Angio Chest Pulmonary Embolism (PE) W or WO Contrast  Result Date: 06/28/2021 CLINICAL DATA:  Pulmonary embolism suspected with high probability. EXAM: CT ANGIOGRAPHY CHEST WITH CONTRAST TECHNIQUE: Multidetector CT imaging of the chest was performed using the standard protocol during bolus administration of intravenous contrast. Multiplanar CT image reconstructions and MIPs were obtained to evaluate the vascular anatomy. RADIATION DOSE REDUCTION: This exam was performed according to the departmental dose-optimization program which includes automated exposure control, adjustment of the mA and/or kV according to patient size and/or use of iterative reconstruction technique. CONTRAST:  36mL OMNIPAQUE IOHEXOL 350 MG/ML SOLN COMPARISON:  05/10/2021 FINDINGS: Cardiovascular: Satisfactory opacification of the pulmonary arteries to the segmental level. No evidence of pulmonary embolism. Diffuse cardiac enlargement. No pericardial effusion. Mediastinum/Nodes: Esophagus is decompressed. No significant lymphadenopathy. Thyroid gland is unremarkable. Diffuse soft tissue edema in the chest wall and mediastinum. Lungs/Pleura: Small bilateral  pleural effusions. Atelectasis in the lung bases. Diffuse  patchy and confluent areas of airspace disease throughout both lungs with interstitial thickening. Changes have developed since the previous study and may represent edema or multifocal pneumonia. Diffuse peribronchial thickening. Upper Abdomen: Diffuse soft tissue edema suggesting anasarca. No focal acute abnormalities. Musculoskeletal: Degenerative changes in the spine and shoulders. Areas of bone sclerosis at the vertebral endplates likely representing degenerative change. Review of the MIP images confirms the above findings. IMPRESSION: 1. No evidence of significant pulmonary embolus. 2. Diffuse cardiac enlargement. 3. Bilateral pleural effusions with basilar atelectasis. 4. Interval progression of diffuse patchy and confluent airspace and interstitial disease in both lungs most likely representing edema although multifocal pneumonia could also have this appearance. Electronically Signed   By: Burman Nieves M.D.   On: 06/28/2021 17:47   CT SHOULDER RIGHT WO CONTRAST  Result Date: 06/27/2021 CLINICAL DATA:  Septic arthritis suspected, shoulder, xray done EXAM: CT OF THE UPPER RIGHT EXTREMITY WITHOUT CONTRAST TECHNIQUE: Multidetector CT imaging of the upper right extremity was performed according to the standard protocol. RADIATION DOSE REDUCTION: This exam was performed according to the departmental dose-optimization program which includes automated exposure control, adjustment of the mA and/or kV according to patient size and/or use of iterative reconstruction technique. COMPARISON:  CT 05/10/2021 FINDINGS: Bones/Joint/Cartilage No acute fracture. No dislocation. Severe glenohumeral joint osteoarthritis manifested by joint space narrowing, subchondral sclerosis/cystic change, and marginal osteophyte formation. There are prominent subchondral cystic changes within the inferior glenoid and superior humeral head. Mild superior glenoid retroversion. Suspect small glenohumeral joint effusion. Moderate  degenerative changes of the acromioclavicular joint. No large subacromial-subdeltoid bursal fluid collection. Remaining visualized osseous structures appear grossly intact. Ligaments Suboptimally assessed by CT. Muscles and Tendons Preserved muscle bulk of the rotator cuff musculature without significant atrophy or fatty infiltration. Rotator cuff tendons appear grossly intact within the limitations of CT. Soft tissues Generalized anasarca. No evidence of a organized fluid collection about the shoulder. IMPRESSION: 1. Severe glenohumeral joint osteoarthritis. Suspect small glenohumeral joint effusion, presumably reactive/degenerative. Septic arthritis is unable to be excluded by imaging. 2. Moderate degenerative changes of the acromioclavicular joint. 3. Generalized anasarca. Electronically Signed   By: [name] D.O.   On: 06/27/2021 18:30   DG CHEST PORT 1 VIEW  Result Date: 06/28/2021 CLINICAL DATA:  56 year old male with shortness of breath. EXAM: PORTABLE CHEST 1 VIEW COMPARISON:  Chest radiographs 06/17/2021 and earlier. FINDINGS: Portable AP semi upright view at 1043 hours. Increased bilateral pulmonary interstitial opacity, diffuse and slightly greater on the right. Underlying cardiomegaly again noted. Stable cardiac size and mediastinal contours. Visualized tracheal air column is within normal limits. No pneumothorax. No pleural effusion or air bronchograms identified. No acute osseous abnormality identified. Chronic severe degeneration at the right glenohumeral joint. IMPRESSION: Evidence of acute pulmonary edema superimposed on cardiomegaly. Edema perhaps asymmetrically greater on the right. Viral/atypical respiratory infection felt less likely. No pleural effusion or other acute cardiopulmonary abnormality identified. Electronically Signed   By: Odessa Fleming M.D.   On: 06/28/2021 10:54   VAS [name] UPPER EXTREMITY VENOUS DUPLEX  Result Date: 06/28/2021 UPPER VENOUS STUDY  Patient Name:  MARCH WALDROP  Date of Exam:   06/28/2021 Medical Rec #: [id number]             Accession #:    [id number] Date of Birth: August 23, 1965              Patient Gender: M Patient Age:  56 years Exam Location:  [facility] Procedure:      VAS [name] UPPER EXTREMITY VENOUS DUPLEX Referring Phys: Hosie Poisson --------------------------------------------------------------------------------  Indications: Edema Limitations: Edema, bandages, line. Comparison Study: Prior negative right upper extremity venous duplex done 06/11/21 Performing Technologist: Sharion Dove RVS  Examination Guidelines: A complete evaluation includes B-mode imaging, spectral Doppler, color Doppler, and power Doppler as needed of all accessible portions of each vessel. Bilateral testing is considered an integral part of a complete examination. Limited examinations for reoccurring indications may be performed as noted.  Right Findings: +----------+------------+---------+-----------+----------+-------+ RIGHT     CompressiblePhasicitySpontaneousPropertiesSummary +----------+------------+---------+-----------+----------+-------+ IJV           Full       Yes       Yes                      +----------+------------+---------+-----------+----------+-------+ Subclavian               Yes       Yes                      +----------+------------+---------+-----------+----------+-------+ Axillary      Full       Yes       Yes                      +----------+------------+---------+-----------+----------+-------+ Brachial      Full                                          +----------+------------+---------+-----------+----------+-------+ Radial        Full                                          +----------+------------+---------+-----------+----------+-------+ Ulnar         Full                                          +----------+------------+---------+-----------+----------+-------+ Cephalic      Full                                           +----------+------------+---------+-----------+----------+-------+ Basilic                  Yes       Yes                      +----------+------------+---------+-----------+----------+-------+  Left Findings: +----------+------------+---------+-----------+----------+-------+ LEFT      CompressiblePhasicitySpontaneousPropertiesSummary +----------+------------+---------+-----------+----------+-------+ Subclavian               Yes       Yes                      +----------+------------+---------+-----------+----------+-------+  Summary:  Right: No evidence of deep vein thrombosis in the upper extremity. No evidence of superficial vein thrombosis in the upper extremity. No change since study done 06/11/21  Left: No evidence of thrombosis in the subclavian.  *See table(s) above for measurements and observations.  Diagnosing physician: Monica Martinez  MD Electronically signed by Monica Martinez MD on 06/28/2021 at 5:17:26 PM.    Final    [name] EKG SITE RITE  Result Date: 06/29/2021 If Site Rite image not attached, placement could not be confirmed due to current cardiac rhythm.    Assessment and Plan:   Atrial fibrillation /flutter with RVR He has since converted to sinus rhythm in the 80s Suspect due to infection + recent surgery +/- acute CHF Metoprolol titrated to 50 mg BID - agree with this and may continue to titrate as needed - SBP 110-130s - suspect in the setting of infection and recent surgery - stressed the importance of avoiding cocaine - he states he thinks its from the stress of not seeing his 60 yo daughter over the last couple of months - was symptomatic with SOB - Mg 1.6 --> 1.7 --> will give another 4 g IV - not a DCCV candidate at this time - will avoid antiarrhythmics while not on anticoagulation   Need for chronic anticoagulation Patient has a history of poor compliance and substance abuse.  We will likely not be a long-term  anticoagulation candidate.  He is now 7 days from last surgery.  Would like to get input from surgical team regarding use of heparin drip while he is in the hospital. Hopefully arrhythmia will improve as his clinical status improves.    Acute systolic heart failure Acute respiratory failure - now resolved LVEF 30 to 35%, normal on echo 04/2021 BNP > 4500 Troponin elevation mild and flat inconsistent with ACS Due to new systolic heart failure, will eventually need an ischemic evaluation, but will need to demonstrate compliance given his multiple recent AMA discharges and substance abuse. Also has been noncompliant with fluid restrictions, fall precautions, and bed alarm.  Would like to start GDMT as BP allows: transition metoprolol to succinate prior to discharge Would like to add on ARB, but will need to stress compliance with follow up appointments and labs.  - agree with 40 mg IV lasix BID --> 3.1 L urine output yesterday - still with crackles and JVD, continue diuresis   MSSA bacteremia ID following TEE deferred in the postoperative period Patient is a difficult placement and will need to stay inpatient for the duration of his IV antibiotics.   Right hand / arm swelling - radial pulse intact - UE warm and dry - duplex negative for DVT    Risk Assessment/Risk Scores:     New York Heart Association (NYHA) Functional Class NYHA Class II  CHA2DS2-VASc Score = 1   This indicates a 0.6% annual risk of stroke. The patient's score is based upon: CHF History: 1 HTN History: 0 Diabetes History: 0 Stroke History: 0 Vascular Disease History: 0 Age Score: 0 Gender Score: 0          For questions or updates, please contact [place] Please consult [email or web address] for contact info under    Signed, Ledora Bottcher, PA  06/30/2021 11:40 AM  Personally seen and examined. Agree with above.  56 year old with several hospitalizations, AMA, polysubstance abuse, recent  cervical spine surgery, retropharyngeal abscess, now with A-fib in the setting of ejection fraction 30 to 35%.  Agree with current plan. Would switch to DOAC if possible from neurosurgery perspective.  Given compliance issues, not sure if he will take this medication. Continue with diuresis for shortness of breath.  Seems reasonable.  No changes made to your current plan.  He is not a candidate for TEE at this  time given prior description of retropharyngeal abscess as well as recent cervical spine surgery.  Candee Furbish, MD

## 2021-06-30 NOTE — Progress Notes (Signed)
PROGRESS NOTE    Logan Nelson  [id number] DOB: 1965-12-12 DOA: 06/16/2021 PCP: [name], MD  56 y.o. male with past medical history of severe protein calorie malnutrition, hypertension, anasarca, abdominal wall hernia presented to the hospital with multiple falls and swelling. -Recently hospitalized for incarcerated abdominal hernia, had primary repair but left AMA before he could be placed in rehab -Admitted 4/3 with severe debility, protein calorie malnutrition and failure to thrive [facility] course complicated by sepsis, MSSA bacteremia - ID following.  Will be undergoing MRI of the spine and sacrum to rule out osteomyelitis.2D Echo also showed worsening EF negative for vegetation -On 5/10 he left AMA, back in the ER same night -MRI thoracic lumbar and sacral spine noted concern for coccygeal osteomyelitis and cervical spine epidural abscess without cord compression, seen by neurosurgery, underwent anterior C4/C5 decompression, arthrodesis C4-C5 with 7 mm structural allograft and anterior instrumentation, TEE was deferred acutely in the setting of recent C-spine surgery  Subjective: -Feels okay, complains of pain and discomfort in his right wrist, denies any discomfort in his shoulders  Assessment and Plan:  MSSA bacteremia  Cervical spine epidural abscess Coccygeal osteomyelitis -Neurosurgery consulted underwent anterior cervical decompression, abscess evacuation and instrumentation 5/17, OR cultures grew MSSA -Remains on IV cefazolin -Seen by cardiology, felt to be high risk for TEE at this time, will require long-term antibiotic course regardless -Repeat blood cultures are negative -He is reluctant to have PICC line placed due to prior complications, will continue to counsel regarding this  Infected sacral decubitus ulcer -Completed 10 days of IV Unasyn, currently on IV Ancef for MSSA bacteremia -Continue local wound care  Right forearm swelling and report of  right shoulder pain -CT right shoulder showed severe glenohumeral osteoarthritis with small effusion -Duplex negative for DVT -Patient denies any pain or discomfort today, has full range of motion, will hold off on joint aspiration today and monitor clinically  Acute systolic congestive heart failure: Echocardiogram 3/23 with ejection fraction 60 to 65% Repeat echocardiogram with 30 to 35%. -Remains on IV Lasix, continue IV lasix -Add Aldactone -Not a good candidate for SGLT2 he is high risk of UTI in the setting of above issues -Per cards not appropriate for TEE in the setting of recent C spine surgery -Would benefit from cardiology FU  Transient A-fib RVR -Back in sinus rhythm, continue metoprolol -Will hold off on anticoagulation acutely in setting of recent C-spine surgery and infection, fortunately he is back in sinus rhythm   Severe protein calorie malnutrition: Continue supplements  Severe debility, frailty: Continue PT OT, SNF recommended TOC consulting,.  Patient will need prolonged antibiotics.    Essential hypertension: Blood pressure are stable.   Anemia of chronic disease: 3 PRBC transfusion during previous hospitalization.  No active bleeding.  Continue to monitor.   Hypomagnesemia: Replaced and adequate.      DVT prophylaxis: Heparin SQ   Code Status: Full code Family Communication: None. Disposition Plan: Status is: Inpatient Remains inpatient appropriate because: Treatment for bacteremia.  Unsafe discharge.      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 Subcutaneous fat may be visible but bone, tendon or muscle are NOT exposed. 2 in vertical by .5 in horizontal--approximately--wound tunneling and pink (Active)  06/17/21 1159  Location: Sacrum  Location Orientation:   Staging: Stage 3 -  Full thickness tissue loss. Subcutaneous fat may be visible but bone, tendon or muscle are NOT exposed.  Wound Description (Comments): 2 in  vertical by .5 in horizontal--approximately--wound tunneling and pink  Present on Admission:       Consultants:  Infectious disease Neurosurgery ENT,   Procedures: 5/17 PROCEDURE:  Anterior Cervical decompression C4/5 Arthrodesis C4-5 with 29mm structural allograft Anterior instrumentation(Nuvasive) C4-5   SURGEON:   Surgeon(s): Ashok Pall, MD Eustace Moore, MD   Objective: Vitals:   06/29/21 1957 06/30/21 0111 06/30/21 0300 06/30/21 0730  BP: 115/81 (!) 118/95 (!) 118/97 (!) 133/100  Pulse: (!) 107 90 87 89  Resp: 20 15 20  (!) 24  Temp: 98.8 F (37.1 C) 98.3 F (36.8 C) 98.1 F (36.7 C) 97.9 F (36.6 C)  TempSrc: Oral Oral Oral Oral  SpO2: 99% 98% 97% 91%  Weight:  65.4 kg    Height:        Intake/Output Summary (Last 24 hours) at 06/30/2021 1146 Last data filed at 06/30/2021 0816 Gross per 24 hour  Intake 1060 ml  Output 1800 ml  Net -740 ml   Filed Weights   06/28/21 1243 06/29/21 0400 06/30/21 0111  Weight: 67.9 kg 68 kg 65.4 kg    Examination:  General exam: Chronically ill thinly built cachectic male laying in bed, AAOx3, no distress HEENT: No JVD CVS: S1-S2, regular rate rhythm Lungs: Few basilar rales, otherwise clear Abdomen: Soft, nontender, bowel sounds present Extremities: Right forearm swelling noted, good range of motion of right shoulder without any tenderness or discomfort Psychiatry:  Mood & affect appropriate.     Data Reviewed:   CBC: Recent Labs  Lab 06/25/21 0716 06/26/21 0326 06/27/21 0055 06/29/21 0152 06/30/21 0340  WBC 14.6* 11.3* 9.7 13.5* 10.1  NEUTROABS 10.9* 6.9 6.6 9.4* 5.5  HGB 9.1* 8.9* 8.8* 9.6* 8.5*  HCT 29.7* 29.4* 28.8* 31.7* 28.2*  MCV 94.9 95.5 95.4 94.3 96.2  PLT 423* 395 373 423* [id number]   Basic Metabolic Panel: Recent Labs  Lab 06/26/21 0326 06/27/21 0055 06/28/21 1034 06/29/21 0152 06/30/21 0340  NA 142 141 138 139 138  K 3.5 3.3* 4.0 4.5 4.0  CL 110 110 106 108 107  CO2 28 27 26 25 26    GLUCOSE 79 119* 120* 103* 92  BUN 14 10 11 13 10   CREATININE 0.53* 0.40* 0.45* 0.53* 0.48*  CALCIUM 8.2* 8.2* 8.9 8.7* 8.3*  MG  --  1.6*  --  1.8 1.7  PHOS  --   --   --  3.0 2.6   GFR: Estimated Creatinine Clearance: 87.6 mL/min (A) (by C-G formula based on SCr of 0.48 mg/dL (L)). Liver Function Tests: No results for input(s): AST, ALT, ALKPHOS, BILITOT, PROT, ALBUMIN in the last 168 hours. No results for input(s): LIPASE, AMYLASE in the last 168 hours. No results for input(s): AMMONIA in the last 168 hours. Coagulation Profile: No results for input(s): INR, PROTIME in the last 168 hours. Cardiac Enzymes: No results for input(s): CKTOTAL, CKMB, CKMBINDEX, TROPONINI in the last 168 hours. BNP (last 3 results) No results for input(s): PROBNP in the last 8760 hours. HbA1C: No results for input(s): HGBA1C in the last 72 hours. CBG: No results for input(s): GLUCAP in the last 168 hours. Lipid Profile: No results for input(s): CHOL, HDL, LDLCALC, TRIG, CHOLHDL, LDLDIRECT in the last 72 hours. Thyroid Function Tests: No results for input(s): TSH, T4TOTAL, FREET4, T3FREE, THYROIDAB in the last 72 hours. Anemia Panel: No results for input(s): VITAMINB12, FOLATE, FERRITIN, TIBC, IRON, RETICCTPCT in the last 72 hours. Urine analysis:    Component Value Date/Time   COLORURINE  AMBER (A) 06/17/2021 1007   APPEARANCEUR HAZY (A) 06/17/2021 1007   LABSPEC 1.028 06/17/2021 1007   PHURINE 5.0 06/17/2021 1007   GLUCOSEU NEGATIVE 06/17/2021 1007   HGBUR LARGE (A) 06/17/2021 1007   BILIRUBINUR NEGATIVE 06/17/2021 1007   KETONESUR NEGATIVE 06/17/2021 1007   PROTEINUR NEGATIVE 06/17/2021 1007   NITRITE NEGATIVE 06/17/2021 1007   LEUKOCYTESUR NEGATIVE 06/17/2021 1007   Sepsis Labs: @LABRCNTIP (procalcitonin:4,lacticidven:4)  ) Recent Results (from the past 240 hour(s))  Surgical pcr screen     Status: None   Collection Time: 06/23/21 10:50 AM   Specimen: Nasal Mucosa; Nasal Swab  Result  Value Ref Range Status   MRSA, PCR NEGATIVE NEGATIVE Final   Staphylococcus aureus NEGATIVE NEGATIVE Final    Comment: (NOTE) The Xpert SA Assay (FDA approved for NASAL specimens in patients 58 years of age and older), is one component of a comprehensive surveillance program. It is not intended to diagnose infection nor to guide or monitor treatment. Performed at [facility], [place] [address], [place], [place] [address]   Aerobic/Anaerobic Culture w Gram Stain (surgical/deep wound)     Status: None   Collection Time: 06/23/21  1:49 PM   Specimen: Soft Tissue, Other  Result Value Ref Range Status   Specimen Description TISSUE  Final   Special Requests ANTERIOR CERVICAL AND EPIDURAL ABSCESS  Final   Gram Stain NO WBC SEEN NO ORGANISMS SEEN   Final   Culture   Final    FEW STAPHYLOCOCCUS AUREUS CRITICAL RESULT CALLED TO, READ BACK BY AND VERIFIED WITH: RN ASHLEY.S AT 1139 ON 06/24/2021 BY T.SAAD. NO ANAEROBES ISOLATED Performed at [facility], [place] [address] [place], [place] [address]    Report Status 06/28/2021 FINAL  Final   Organism ID, Bacteria STAPHYLOCOCCUS AUREUS  Final      Susceptibility   Staphylococcus aureus - MIC*    CIPROFLOXACIN <=0.5 SENSITIVE Sensitive     ERYTHROMYCIN >=8 RESISTANT Resistant     GENTAMICIN <=0.5 SENSITIVE Sensitive     OXACILLIN <=0.25 SENSITIVE Sensitive     TETRACYCLINE <=1 SENSITIVE Sensitive     VANCOMYCIN 1 SENSITIVE Sensitive     TRIMETH/SULFA <=10 SENSITIVE Sensitive     CLINDAMYCIN RESISTANT Resistant     RIFAMPIN <=0.5 SENSITIVE Sensitive     Inducible Clindamycin POSITIVE Resistant     * FEW STAPHYLOCOCCUS AUREUS  Aerobic/Anaerobic Culture w Gram Stain (surgical/deep wound)     Status: None   Collection Time: 06/23/21  2:32 PM   Specimen: PATH Other; Tissue  Result Value Ref Range Status   Specimen Description TISSUE  Final   Special Requests NONE  Final   Gram Stain   Final    RARE WBC PRESENT, PREDOMINANTLY  MONONUCLEAR RARE GRAM POSITIVE COCCI Performed at [facility], [address], [place], [place] [address]    Culture   Final    FEW STAPHYLOCOCCUS AUREUS SUSCEPTIBILITIES PERFORMED ON PREVIOUS CULTURE WITHIN THE LAST 5 DAYS. NO ANAEROBES ISOLATED; CULTURE IN PROGRESS FOR 5 DAYS    Report Status 06/28/2021 FINAL  Final     Radiology Studies: CT Angio Chest Pulmonary Embolism (PE) W or WO Contrast  Result Date: 06/28/2021 CLINICAL DATA:  Pulmonary embolism suspected with high probability. EXAM: CT ANGIOGRAPHY CHEST WITH CONTRAST TECHNIQUE: Multidetector CT imaging of the chest was performed using the standard protocol during bolus administration of intravenous contrast. Multiplanar CT image reconstructions and MIPs were obtained to evaluate the vascular anatomy. RADIATION DOSE  REDUCTION: This exam was performed according to the departmental dose-optimization program which includes automated exposure control, adjustment of the mA and/or kV according to patient size and/or use of iterative reconstruction technique. CONTRAST:  35mL OMNIPAQUE IOHEXOL 350 MG/ML SOLN COMPARISON:  05/10/2021 FINDINGS: Cardiovascular: Satisfactory opacification of the pulmonary arteries to the segmental level. No evidence of pulmonary embolism. Diffuse cardiac enlargement. No pericardial effusion. Mediastinum/Nodes: Esophagus is decompressed. No significant lymphadenopathy. Thyroid gland is unremarkable. Diffuse soft tissue edema in the chest wall and mediastinum. Lungs/Pleura: Small bilateral pleural effusions. Atelectasis in the lung bases. Diffuse patchy and confluent areas of airspace disease throughout both lungs with interstitial thickening. Changes have developed since the previous study and may represent edema or multifocal pneumonia. Diffuse peribronchial thickening. Upper Abdomen: Diffuse soft tissue edema suggesting anasarca. No focal acute abnormalities. Musculoskeletal: Degenerative changes in the spine and  shoulders. Areas of bone sclerosis at the vertebral endplates likely representing degenerative change. Review of the MIP images confirms the above findings. IMPRESSION: 1. No evidence of significant pulmonary embolus. 2. Diffuse cardiac enlargement. 3. Bilateral pleural effusions with basilar atelectasis. 4. Interval progression of diffuse patchy and confluent airspace and interstitial disease in both lungs most likely representing edema although multifocal pneumonia could also have this appearance. Electronically Signed   By: Lucienne Capers M.D.   On: 06/28/2021 17:47   VAS [name] UPPER EXTREMITY VENOUS DUPLEX  Result Date: 06/28/2021 UPPER VENOUS STUDY  Patient Name:  GINGER NOYOLA  Date of Exam:   06/28/2021 Medical Rec #: [id number]             Accession #:    [id number] Date of Birth: [birth year]              Patient Gender: M Patient Age:   78 years Exam Location:  [facility] Procedure:      VAS [name] UPPER EXTREMITY VENOUS DUPLEX Referring Phys: Hosie Poisson --------------------------------------------------------------------------------  Indications: Edema Limitations: Edema, bandages, line. Comparison Study: Prior negative right upper extremity venous duplex done 06/11/21 Performing Technologist: Sharion Dove RVS  Examination Guidelines: A complete evaluation includes B-mode imaging, spectral Doppler, color Doppler, and power Doppler as needed of all accessible portions of each vessel. Bilateral testing is considered an integral part of a complete examination. Limited examinations for reoccurring indications may be performed as noted.  Right Findings: +----------+------------+---------+-----------+----------+-------+ RIGHT     CompressiblePhasicitySpontaneousPropertiesSummary +----------+------------+---------+-----------+----------+-------+ IJV           Full       Yes       Yes                      +----------+------------+---------+-----------+----------+-------+ Subclavian                Yes       Yes                      +----------+------------+---------+-----------+----------+-------+ Axillary      Full       Yes       Yes                      +----------+------------+---------+-----------+----------+-------+ Brachial      Full                                          +----------+------------+---------+-----------+----------+-------+ Radial  Full                                          +----------+------------+---------+-----------+----------+-------+ Ulnar         Full                                          +----------+------------+---------+-----------+----------+-------+ Cephalic      Full                                          +----------+------------+---------+-----------+----------+-------+ Basilic                  Yes       Yes                      +----------+------------+---------+-----------+----------+-------+  Left Findings: +----------+------------+---------+-----------+----------+-------+ LEFT      CompressiblePhasicitySpontaneousPropertiesSummary +----------+------------+---------+-----------+----------+-------+ Subclavian               Yes       Yes                      +----------+------------+---------+-----------+----------+-------+  Summary:  Right: No evidence of deep vein thrombosis in the upper extremity. No evidence of superficial vein thrombosis in the upper extremity. No change since study done 06/11/21  Left: No evidence of thrombosis in the subclavian.  *See table(s) above for measurements and observations.  Diagnosing physician: Monica Martinez MD Electronically signed by Monica Martinez MD on 06/28/2021 at 5:17:26 PM.    Final    [name] EKG SITE RITE  Result Date: 06/29/2021 If Site Rite image not attached, placement could not be confirmed due to current cardiac rhythm.    Scheduled Meds:  feeding supplement  1 Container Oral TID BM   furosemide  40 mg Intravenous BID   heparin  injection (subcutaneous)  5,000 Units Subcutaneous Q8H   lidocaine  1 patch Transdermal Q24H   metoprolol tartrate  50 mg Oral BID   multivitamin with minerals  1 tablet Oral Daily   potassium chloride  40 mEq Oral BID   senna-docusate  2 tablet Oral BID   Continuous Infusions:  sodium chloride 10 mL/hr at 06/28/21 1830    ceFAZolin (ANCEF) IV 2 g (06/30/21 0904)   magnesium sulfate bolus IVPB       LOS: 13 days    Time spent: 71min  Domenic Polite, MD [facility]   06/30/2021, 11:46 AM

## 2021-06-30 NOTE — Progress Notes (Signed)
Patient noncompliant with fall risk measures; refuses to sit on recliner during bedding change stating "it'll be too cold". Educated patient on risk for and complications of falling. Patient remains standing at this time. Cleaning in progress.

## 2021-06-30 NOTE — TOC Progression Note (Signed)
Transition of Care [facility]) - Progression Note    Patient Details  Name: Logan Nelson MRN: [id number] Date of Birth: 04/13/1965  Transition [facility]) CM/SW Contact  Ivette Loyal, [place] Phone Number: 06/30/2021, 1:54 PM  Clinical Narrative:    Pt not medically stable for DC, has a bed at [facility]. CSW will continue to follow for DC planning needs.         Expected Discharge Plan and Services                                                 Social Determinants of Health (SDOH) Interventions    Readmission Risk Interventions    06/08/2021    1:48 PM 05/05/2021   10:01 AM  Readmission Risk Prevention Plan  Transportation Screening Complete Complete  Medication Review (RN Care Manager) Referral to Pharmacy Referral to Pharmacy  PCP or Specialist appointment within 3-5 days of discharge Complete Complete  HRI or Home Care Consult Patient refused Patient refused  SW Recovery Care/Counseling Consult Complete Complete  Palliative Care Screening Not Applicable Not Applicable  Skilled Nursing Facility Not Applicable Complete

## 2021-07-01 DIAGNOSIS — R7881 Bacteremia: Secondary | ICD-10-CM | POA: Diagnosis not present

## 2021-07-01 DIAGNOSIS — M00011 Staphylococcal arthritis, right shoulder: Secondary | ICD-10-CM | POA: Diagnosis not present

## 2021-07-01 DIAGNOSIS — G061 Intraspinal abscess and granuloma: Secondary | ICD-10-CM | POA: Diagnosis not present

## 2021-07-01 DIAGNOSIS — L89302 Pressure ulcer of unspecified buttock, stage 2: Secondary | ICD-10-CM | POA: Diagnosis not present

## 2021-07-01 DIAGNOSIS — B9561 Methicillin susceptible Staphylococcus aureus infection as the cause of diseases classified elsewhere: Secondary | ICD-10-CM | POA: Diagnosis not present

## 2021-07-01 DIAGNOSIS — I5021 Acute systolic (congestive) heart failure: Secondary | ICD-10-CM | POA: Diagnosis not present

## 2021-07-01 LAB — BASIC METABOLIC PANEL
Anion gap: 7 (ref 5–15)
BUN: 14 mg/dL (ref 6–20)
CO2: 26 mmol/L (ref 22–32)
Calcium: 8.6 mg/dL — ABNORMAL LOW (ref 8.9–10.3)
Chloride: 105 mmol/L (ref 98–111)
Creatinine, Ser: 0.5 mg/dL — ABNORMAL LOW (ref 0.61–1.24)
GFR, Estimated: >60 mL/min (ref >60)
Glucose, Bld: 79 mg/dL (ref 70–99)
Potassium: 4.1 mmol/L (ref 3.5–5.1)
Sodium: 138 mmol/L (ref 135–145)

## 2021-07-01 LAB — CBC
HCT: 32.3 % — ABNORMAL LOW (ref 39.0–52.0)
Hemoglobin: 9.8 g/dL — ABNORMAL LOW (ref 13.0–17.0)
MCH: 29.4 pg (ref 26.0–34.0)
MCHC: 30.3 g/dL (ref 30.0–36.0)
MCV: 97 fL (ref 80.0–100.0)
Platelets: 373 10*3/uL (ref 150–400)
RBC: 3.33 MIL/uL — ABNORMAL LOW (ref 4.22–5.81)
RDW: 18.5 % — ABNORMAL HIGH (ref 11.5–15.5)
WBC: 10.4 10*3/uL (ref 4.0–10.5)
nRBC: 0 % (ref 0.0–0.2)

## 2021-07-01 MED ORDER — SODIUM CHLORIDE 0.9% FLUSH: 10.0000 mL | INTRAVENOUS | Status: DC | PRN

## 2021-07-01 MED ORDER — CHLORHEXIDINE GLUCONATE CLOTH 2 % EX PADS
6.0000 | MEDICATED_PAD | Freq: Every day | CUTANEOUS | Status: DC
  Administered 2021-07-02 – 2021-07-05 (×3): 6 via TOPICAL

## 2021-07-01 MED ORDER — SODIUM CHLORIDE 0.9% FLUSH
10.0000 mL | Freq: Two times a day (BID) | INTRAVENOUS | Status: DC
  Administered 2021-07-02: 10 mL
  Administered 2021-07-02: 30 mL
  Administered 2021-07-03 – 2021-07-05 (×4): 10 mL

## 2021-07-01 MED ORDER — LORAZEPAM 2 MG/ML IJ SOLN
1.0000 mg | INTRAMUSCULAR | Status: AC | PRN
  Administered 2021-07-01 – 2021-07-02 (×2): 1 mg via INTRAVENOUS
  Filled 2021-07-01 (×2): qty 1

## 2021-07-01 NOTE — Progress Notes (Signed)
Occupational Therapy Treatment Patient Details Name: Logan Nelson MRN: [id number] DOB: 05/12/1965 Today's Date: 07/01/2021   History of present illness 56 yr old M admitted 06/16/21 with weakness and back pain.  Recent admissions for hernia repair and B LE weakness/falls where pt has left AMA both times.  Imaging (+) for C4-5 abscess and OM of coccyx. On 5/17 ACDF of C4-5 with I&D of abscess. PMH: anemia, arthritis, bradycardia, falls, HTN   OT comments  Patient seen after completing ambulation with mobility specialist. Patient concerned over edema in RUE and addressed with retrograde massage and education. Patient provided hand squeezer to address hand use. Patient instructed on use of sock aide for donning socks and required mod assist due to difficulty donning on aide and pulling up. Gown change performed while standing with min assist. Patient instructed in HEP with level one therapy band and for LUE and AAROM exercises for RUE. Patient appears motivated to improve functionally. Acute OT to continue to follow.     Recommendations for follow up therapy are one component of a multi-disciplinary discharge planning process, led by the attending physician.  Recommendations may be updated based on patient status, additional functional criteria and insurance authorization.    Follow Up Recommendations  Skilled nursing-short term rehab (<3 hours/day)    Assistance Recommended at Discharge Frequent or constant Supervision/Assistance  Patient can return home with the following  A lot of help with bathing/dressing/bathroom;Assist for transportation;Assistance with cooking/housework;A little help with walking and/or transfers;Direct supervision/assist for medications management;Direct supervision/assist for financial management   Equipment Recommendations  BSC/3in1;Tub/shower bench;Wheelchair (measurements OT);Wheelchair cushion (measurements OT)    Recommendations for Other Services       Precautions / Restrictions Precautions Precautions: Fall;Cervical Precaution Comments: pt not concerned with cervical precautions       Mobility Bed Mobility Overal bed mobility: Needs Assistance                  Transfers                   General transfer comment: up in recliner and seated on EOB at end of session     Balance Overall balance assessment: Needs assistance Sitting-balance support: Feet supported, Bilateral upper extremity supported Sitting balance-Leahy Scale: Fair Sitting balance - Comments: sitting EOB on air matress at end of session   Standing balance support: Single extremity supported Standing balance-Leahy Scale: Poor Standing balance comment: reliant on RW for support                           ADL either performed or assessed with clinical judgement   ADL Overall ADL's : Needs assistance/impaired                 Upper Body Dressing : Minimal assistance;Standing Upper Body Dressing Details (indicate cue type and reason): changed gown standing Lower Body Dressing: Moderate assistance;Sitting/lateral leans;Sit to/from stand;With adaptive equipment Lower Body Dressing Details (indicate cue type and reason): education on sock aide use for donning socks; able to doff socks               General ADL Comments: patient states he has difficulty donning socks and was educated on sock aid use with difficulty putting sock on aid and pulling up    Extremity/Trunk Assessment Upper Extremity Assessment RUE Deficits / Details: reports decreased sensation in digits that impairs ability to hold items. weaker grasp RUE Sensation: decreased light touch;decreased proprioception  RUE Coordination: decreased fine motor;decreased gross motor LUE Deficits / Details: reports decreased sensation in digits that impairs ability to hold items. LUE Sensation: decreased light touch LUE Coordination: decreased fine motor             Vision       Perception     Praxis      Cognition Arousal/Alertness: Awake/alert Behavior During Therapy: Flat affect Overall Cognitive Status: No family/caregiver present to determine baseline cognitive functioning Area of Impairment: Safety/judgement, Awareness, Problem solving, Attention, Memory                       Following Commands: Follows one step commands consistently       General Comments: recalls therapist from previous visit. Frequent cues for safety        Exercises Exercises: General Upper Extremity General Exercises - Upper Extremity Shoulder Flexion: AAROM, Right, Strengthening, Left Shoulder ABduction: Strengthening, Left, 10 reps, AAROM, Right Digit Composite Flexion: Squeeze ball, 10 reps    Shoulder Instructions       General Comments retrograde massage to RUE hand    Pertinent Vitals/ Pain       Pain Assessment Pain Assessment: Faces Faces Pain Scale: Hurts a little bit Pain Location: shoulders Pain Descriptors / Indicators: Aching Pain Intervention(s): Limited activity within patient's tolerance, Monitored during session  Home Living                                          Prior Functioning/Environment              Frequency  Min 2X/week        Progress Toward Goals  OT Goals(current goals can now be found in the care plan section)  Progress towards OT goals: Progressing toward goals  Acute Rehab OT Goals Patient Stated Goal: get better OT Goal Formulation: With patient Time For Goal Achievement: 07/03/21 Potential to Achieve Goals: Fair ADL Goals Pt Will Perform Grooming: with supervision;standing Pt Will Perform Upper Body Bathing: with set-up;sitting Pt Will Perform Lower Body Bathing: with set-up;sitting/lateral leans Pt Will Perform Upper Body Dressing: with set-up;sitting Pt Will Perform Lower Body Dressing: with set-up;sitting/lateral leans Pt Will Transfer to Toilet: with  supervision;bedside commode Pt Will Perform Toileting - Clothing Manipulation and hygiene: with supervision;sitting/lateral leans Pt/caregiver will Perform Home Exercise Program: Increased strength;Both right and left upper extremity;With theraband;With Supervision;With written HEP provided Additional ADL Goal #1: Pt will tolerate 3 minutes of dynamic standing task wtih min G as a precursor to ADL performance  Plan Discharge plan remains appropriate    Co-evaluation                 AM-PAC OT "6 Clicks" Daily Activity     Outcome Measure   Help from another person eating meals?: None Help from another person taking care of personal grooming?: A Little Help from another person toileting, which includes using toliet, bedpan, or urinal?: A Lot Help from another person bathing (including washing, rinsing, drying)?: A Lot Help from another person to put on and taking off regular upper body clothing?: A Little Help from another person to put on and taking off regular lower body clothing?: A Lot 6 Click Score: 16    End of Session Equipment Utilized During Treatment: Rolling walker (2 wheels)  OT Visit Diagnosis: Unsteadiness on feet (R26.81);Other abnormalities of gait  and mobility (R26.89);Muscle weakness (generalized) (M62.81);History of falling (Z91.81) Hemiplegia - Right/Left: Right Hemiplegia - dominant/non-dominant: Dominant Hemiplegia - caused by: Unspecified   Activity Tolerance Patient tolerated treatment well   Patient Left in bed;with call bell/phone within reach   Nurse Communication Mobility status        Time: 6767-2094 OT Time Calculation (min): 31 min  Charges: OT General Charges $OT Visit: 1 Visit OT Treatments $Self Care/Home Management : 8-22 mins $Therapeutic Exercise: 8-22 mins  Alfonse Flavors, OTA Acute Rehabilitation Services  Pager [phone] Office [phone]   Dewain Penning 07/01/2021, 3:09 PM

## 2021-07-01 NOTE — Progress Notes (Signed)
[facility] for Infectious Disease  Date of Admission:  06/16/2021           Reason for visit: Follow up on MSSA Bacteremia   Current antibiotics: Cefazolin   ASSESSMENT:    56 y.o. male admitted with:  MSSA bacteremia: Complicated by [id number] epidural abscess with discitis/OM.  Status post anterior cervical decompression and abscess evacuation with instrumentation on 06/23/21.  OR cultures grew MSSA. Currently on Cefazolin.  Per cardiology, patient is high risk for TEE and so this was not been obtained although unlikely to change antibiotic plan at this juncture. Infected sacral decubitus ulcer: Completed a course of Unasyn x 10 days and now on Cefazolin for problem #1.  Right shoulder pain and UE swelling: CT shoulder shows severe glenohumeral joint osteoarthritis. Suspect small glenohumeral joint effusion, presumably reactive/degenerative. Septic arthritis is unable to be excluded by imaging, however, he has adequate ROM at this time.  Venous duplex to assess his swelling was negative for DVT.  Acute HFrEF:  TTE 06/15/21 showed LVEF 30-35%, global hypokinesis.  RECOMMENDATIONS:    Continue cefazolin Wound care, lab monitoring Has a bed at [facility] per CSW notes from yesterday Will follow for dispo planning   Principal Problem:   MSSA bacteremia Active Problems:   Hypertension   Protein-calorie malnutrition, severe   Anemia of chronic disease   Generalized weakness   Pressure injury of buttock, stage 2 (HCC)   Acute systolic (congestive) heart failure (HCC)   Prolonged QT interval   Epidural abscess   Abscess in epidural space of cervical spine    MEDICATIONS:    Scheduled Meds:  (feeding supplement) PROSource Plus  30 mL Oral BID BM   feeding supplement  1 Container Oral TID BM   furosemide  40 mg Intravenous BID   heparin injection (subcutaneous)  5,000 Units Subcutaneous Q8H   lidocaine  1 patch Transdermal Q24H   metoprolol tartrate  50 mg Oral BID    multivitamin with minerals  1 tablet Oral Daily   potassium chloride  40 mEq Oral BID   senna-docusate  2 tablet Oral BID   spironolactone  25 mg Oral Daily   Continuous Infusions:  sodium chloride 10 mL/hr at 06/28/21 1830    ceFAZolin (ANCEF) IV 2 g (07/01/21 0821)   PRN Meds:.sodium chloride, acetaminophen **OR** acetaminophen, bisacodyl, diazepam, hydrALAZINE, magnesium hydroxide, menthol-cetylpyridinium **OR** phenol, morphine injection, oxyCODONE, polyethylene glycol  SUBJECTIVE:   24 hour events:  No acute events Afebrile Non adherent with fall precautions No new cx No new images   No complaints.  He wants to leave.  His shoulder is doing fine and his arm swelling is not worse.   His urine cannister on the wall is about to over flow and he is waiting for that to be emptied.   Review of Systems  All other systems reviewed and are negative.    OBJECTIVE:   Blood pressure (!) 128/95, pulse 88, temperature 98.3 F (36.8 C), temperature source Oral, resp. rate 19, height 5\' 5"  (1.651 m), weight 66.1 kg, SpO2 95 %. Body mass index is 24.26 kg/m.  Physical Exam Constitutional:      Comments: Chronically ill appearing but in no distress.   HENT:     Head: Normocephalic and atraumatic.     Mouth/Throat:     Comments: His dentition is poor.  Eyes:     Extraocular Movements: Extraocular movements intact.     Conjunctiva/sclera: Conjunctivae normal.  Pulmonary:  Effort: Pulmonary effort is normal. No respiratory distress.  Abdominal:     General: There is no distension.     Palpations: Abdomen is soft.  Musculoskeletal:        General: Swelling present. Normal range of motion.     Cervical back: Normal range of motion and neck supple.  Skin:    General: Skin is warm and dry.     Findings: No rash.  Neurological:     General: No focal deficit present.     Mental Status: He is oriented to person, place, and time.  Psychiatric:        Mood and Affect: Mood  normal.        Behavior: Behavior normal.     Lab Results: Lab Results  Component Value Date   WBC 10.4 07/01/2021   HGB 9.8 (L) 07/01/2021   HCT 32.3 (L) 07/01/2021   MCV 97.0 07/01/2021   PLT 373 07/01/2021    Lab Results  Component Value Date   NA 138 07/01/2021   K 4.1 07/01/2021   CO2 26 07/01/2021   GLUCOSE 79 07/01/2021   BUN 14 07/01/2021   CREATININE 0.50 (L) 07/01/2021   CALCIUM 8.6 (L) 07/01/2021   GFRNONAA >60 07/01/2021   GFRAA >60 01/23/2019    Lab Results  Component Value Date   ALT 13 06/23/2021   AST 20 06/23/2021   ALKPHOS 72 06/23/2021   BILITOT 0.8 06/23/2021       Component Value Date/Time   CRP 15.5 (H) 06/14/2021 1655       Component Value Date/Time   ESRSEDRATE 30 (H) 06/14/2021 1655     I have reviewed the micro and lab results in Epic.  Imaging: [name] EKG SITE RITE  Result Date: 06/29/2021 If Site Rite image not attached, placement could not be confirmed due to current cardiac rhythm.    Imaging independently reviewed in Epic.    Raynelle Highland for Infectious Disease [facility] [phone] pager 07/01/2021, 8:25 AM

## 2021-07-01 NOTE — Progress Notes (Signed)
Peripherally Inserted Central Catheter Placement  The IV Nurse has discussed with the patient and/or persons authorized to consent for the patient, the purpose of this procedure and the potential benefits and risks involved with this procedure.  The benefits include less needle sticks, lab draws from the catheter, and the patient may be discharged home with the catheter. Risks include, but not limited to, infection, bleeding, blood clot (thrombus formation), and puncture of an artery; nerve damage and irregular heartbeat and possibility to perform a PICC exchange if needed/ordered by physician.  Alternatives to this procedure were also discussed.  Bard Power PICC patient education guide, fact sheet on infection prevention and patient information card has been provided to patient /or left at bedside.    PICC Placement Documentation  PICC Single Lumen 07/01/21 Right Basilic 40 cm 0 cm (Active)  Indication for Insertion or Continuance of Line Prolonged intravenous therapies 07/01/21 2207  Exposed Catheter (cm) 0 cm 07/01/21 2207  Site Assessment Clean, Dry, Intact 07/01/21 2207  Line Status Flushed;Blood return noted;Saline locked 07/01/21 2207  Dressing Type Transparent 07/01/21 2207  Dressing Status Antimicrobial disc in place;Clean, Dry, Intact 07/01/21 2207  Safety Lock Not Applicable 07/01/21 2207  Line Care Connections checked and tightened 07/01/21 2207  Line Adjustment (NICU/IV Team Only) No 07/01/21 2207  Dressing Intervention New dressing 07/01/21 2207  Dressing Change Due 07/08/21 07/01/21 2207       Logan Nelson, Norton Pastel 07/01/2021, 10:09 PM

## 2021-07-01 NOTE — Progress Notes (Signed)
PHARMACY CONSULT NOTE FOR:  OUTPATIENT  PARENTERAL ANTIBIOTIC THERAPY (OPAT)  Indication: MSSA bacteremia/cervical epidural abscess Regimen: Cefazolin 2 gm IV Q 8 hours End date: 08/04/21  IV antibiotic discharge orders are pended. To discharging provider:  please sign these orders via discharge navigator,  Select New Orders & click on the button choice - Manage This Unsigned Work.     Thank you for allowing pharmacy to be a part of this patient's care.  Jimmy Footman, PharmD, BCPS, BCIDP Infectious Diseases Clinical Pharmacist Phone: [phone] 07/01/2021, 11:06 AM

## 2021-07-01 NOTE — Progress Notes (Signed)
Physical Therapy Treatment Patient Details Name: Logan Nelson MRN: [id number] DOB: 1966-02-06 Today's Date: 07/01/2021   History of Present Illness 56 yr old M admitted 06/16/21 with weakness and back pain.  Recent admissions for hernia repair and B LE weakness/falls where pt has left AMA both times.  Imaging (+) for C4-5 abscess and OM of coccyx. On 5/17 ACDF of C4-5 with I&D of abscess. PMH: anemia, arthritis, bradycardia, falls, HTN    PT Comments    Pt initially resistant to mobility but ultimately agreeable to getting up to walk. Pt would not respond to questions for cognitive assessment or PLOF but did follow commands during session. Pt reports shoulder pain and maintains scapular elevation with cues to relax and depress. Pt with decreased safety awareness but with lack of insight and reception from staff. Pt would benefit from supervision for safety and will decrease acute frequency to maximize balance.     Recommendations for follow up therapy are one component of a multi-disciplinary discharge planning process, led by the attending physician.  Recommendations may be updated based on patient status, additional functional criteria and insurance authorization.  Follow Up Recommendations  Skilled nursing-short term rehab (<3 hours/day)     Assistance Recommended at Discharge Intermittent Supervision/Assistance  Patient can return home with the following A little help with bathing/dressing/bathroom;A little help with walking and/or transfers;Assistance with cooking/housework;Direct supervision/assist for financial management;Direct supervision/assist for medications management   Equipment Recommendations  None recommended by PT    Recommendations for Other Services       Precautions / Restrictions Precautions Precautions: Fall;Cervical Precaution Comments: pt not concerned with cervical precautions     Mobility  Bed Mobility Overal bed mobility: Needs Assistance Bed  Mobility: Supine to Sit     Supine to sit: Supervision     General bed mobility comments: HOB flat with supervision for lines. pt with lines wrapped around him in sidelying with pt able to rise to sitting with one leg in bed then swing it over railing as pt lying upside down in bed    Transfers Overall transfer level: Needs assistance   Transfers: Sit to/from Stand Sit to Stand: Supervision           General transfer comment: supervision for lines with pt able to stand from bed x 3. mod i to pivot bed to [facility]    Ambulation/Gait Ambulation/Gait assistance: Supervision Gait Distance (Feet): 400 Feet Assistive device: Rolling walker (2 wheels) Gait Pattern/deviations: Step-through pattern, Decreased stride length, Trunk flexed   Gait velocity interpretation: 1.31 - 2.62 ft/sec, indicative of limited community ambulator   General Gait Details: cues for direction as pt unable to recall room number and for scapular depression with use of RW. pt then walked 15' in room without RW with increased alteration to gait with recommendation for maintained RW use   Stairs Stairs:  (pt denied attempting)           Wheelchair Mobility    Modified Rankin (Stroke Patients Only)       Balance                                            Cognition Arousal/Alertness: Awake/alert Behavior During Therapy: Flat affect Overall Cognitive Status: No family/caregiver present to determine baseline cognitive functioning Area of Impairment: Safety/judgement, Awareness, Problem solving, Attention, Memory  Following Commands: Follows one step commands consistently       General Comments: pt would not respond to orientation or safety questions and frequently stating "stop being nosey" when asking about PLOF or home setup. Pt very insistent on doing things his way in his time        Exercises      General Comments        Pertinent  Vitals/Pain Pain Assessment Pain Score: 4  Pain Location: shoulders Pain Descriptors / Indicators: Aching Pain Intervention(s): Limited activity within patient's tolerance, Monitored during session, Other (comment) (cues for shoulder relaxation as pt maintains elevation)    Home Living                          Prior Function            PT Goals (current goals can now be found in the care plan section) Acute Rehab PT Goals Time For Goal Achievement: 07/15/21 Potential to Achieve Goals: Fair Progress towards PT goals: Goals met and updated - see care plan    Frequency    Min 1X/week      PT Plan Current plan remains appropriate;Frequency needs to be updated    Co-evaluation              AM-PAC PT "6 Clicks" Mobility   Outcome Measure  Help needed turning from your back to your side while in a flat bed without using bedrails?: A Little Help needed moving from lying on your back to sitting on the side of a flat bed without using bedrails?: A Little Help needed moving to and from a bed to a chair (including a wheelchair)?: A Little Help needed standing up from a chair using your arms (e.g., wheelchair or bedside chair)?: A Little Help needed to walk in hospital room?: A Little Help needed climbing 3-5 steps with a railing? : Total 6 Click Score: 16    End of Session   Activity Tolerance: Patient tolerated treatment well Patient left: Other (comment) (on [facility] with call button. RN states pt has refused all alarms and was ok in current position) Nurse Communication: Mobility status PT Visit Diagnosis: Other abnormalities of gait and mobility (R26.89);History of falling (Z91.81)     Time: 4628-6381 PT Time Calculation (min) (ACUTE ONLY): 26 min  Charges:  $Gait Training: 8-22 mins $Therapeutic Activity: 8-22 mins                     Sherryl Valido P, PT Acute Rehabilitation Services Pager: [phone] Office: [place] 07/01/2021,  12:35 PM

## 2021-07-01 NOTE — Progress Notes (Addendum)
Progress Note  Patient Name: Logan Nelson Date of Encounter: 07/01/2021  [facility] Cardiologist: Early Osmond, MD   Subjective   Patient denies any chest pain, sob. Has infrequent palpitations when he feels anxious or depressed. Maintaining sinus rhythm per telemetry   Inpatient Medications    Scheduled Meds:  (feeding supplement) PROSource Plus  30 mL Oral BID BM   feeding supplement  1 Container Oral TID BM   furosemide  40 mg Intravenous BID   heparin injection (subcutaneous)  5,000 Units Subcutaneous Q8H   lidocaine  1 patch Transdermal Q24H   metoprolol tartrate  50 mg Oral BID   multivitamin with minerals  1 tablet Oral Daily   potassium chloride  40 mEq Oral BID   senna-docusate  2 tablet Oral BID   spironolactone  25 mg Oral Daily   Continuous Infusions:  sodium chloride 10 mL/hr at 06/28/21 1830    ceFAZolin (ANCEF) IV 2 g (07/01/21 0821)   PRN Meds: sodium chloride, acetaminophen **OR** acetaminophen, bisacodyl, diazepam, hydrALAZINE, magnesium hydroxide, menthol-cetylpyridinium **OR** phenol, morphine injection, oxyCODONE, polyethylene glycol   Vital Signs    Vitals:   06/30/21 1800 06/30/21 2123 07/01/21 0141 07/01/21 0813  BP: 120/85 114/78  (!) 128/95  Pulse: 89   88  Resp: 18 20  19   Temp: 98.2 F (36.8 C) 98.1 F (36.7 C)  98.3 F (36.8 C)  TempSrc: Oral Oral  Oral  SpO2: 100% 97%  95%  Weight:   66.1 kg   Height:        Intake/Output Summary (Last 24 hours) at 07/01/2021 0928 Last data filed at 07/01/2021 [id number] Gross per 24 hour  Intake 440 ml  Output 3350 ml  Net -2910 ml      07/01/2021    1:41 AM 06/30/2021    1:11 AM 06/29/2021    4:00 AM  Last 3 Weights  Weight (lbs) 145 lb 12.8 oz 144 lb 2.9 oz 149 lb 14.6 oz  Weight (kg) 66.134 kg 65.4 kg 68 kg      Telemetry    Sinus rhythm - Personally Reviewed  ECG    No new tracings since 5/23 - Personally Reviewed  Physical Exam   GEN: No acute distress. Sitting  comfortably on the side of the bed  Neck: No JVD Cardiac: RRR, no murmurs, rubs, or gallops.  Respiratory: Fine crackles in bilateral lung bases GI: Soft, nontender, non-distended  MS: 1+ pitting edema in bilateral lower extremities  Neuro:  Nonfocal  Psych: Normal affect   Labs    High Sensitivity Troponin:   Recent Labs  Lab 06/28/21 1034 06/28/21 1236  TROPONINIHS 57* 55*     Chemistry Recent Labs  Lab 06/27/21 0055 06/28/21 1034 06/29/21 0152 06/30/21 0340 07/01/21 0414  NA 141   < > 139 138 138  K 3.3*   < > 4.5 4.0 4.1  CL 110   < > 108 107 105  CO2 27   < > 25 26 26   GLUCOSE 119*   < > 103* 92 79  BUN 10   < > 13 10 14   CREATININE 0.40*   < > 0.53* 0.48* 0.50*  CALCIUM 8.2*   < > 8.7* 8.3* 8.6*  MG 1.6*  --  1.8 1.7  --   GFRNONAA >60   < > >60 >60 >60  ANIONGAP 4*   < > 6 5 7    < > = values in this interval not displayed.  Lipids No results for input(s): CHOL, TRIG, HDL, LABVLDL, LDLCALC, CHOLHDL in the last 168 hours.  Hematology Recent Labs  Lab 06/29/21 0152 06/30/21 0340 07/01/21 0414  WBC 13.5* 10.1 10.4  RBC 3.36* 2.93* 3.33*  HGB 9.6* 8.5* 9.8*  HCT 31.7* 28.2* 32.3*  MCV 94.3 96.2 97.0  MCH 28.6 29.0 29.4  MCHC 30.3 30.1 30.3  RDW 18.3* 18.4* 18.5*  PLT 423* 352 373   Thyroid No results for input(s): TSH, FREET4 in the last 168 hours.  BNP Recent Labs  Lab 06/28/21 1236  BNP >4,500.0*    DDimer No results for input(s): DDIMER in the last 168 hours.   Radiology    [name] EKG SITE RITE  Result Date: 06/29/2021 If [facility] image not attached, placement could not be confirmed due to current cardiac rhythm.   Cardiac Studies   Echo 06/15/21:  1. There is a mobile filamentous structure in the RA. 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 No valvular lesions are present to suggest endocarditis.  Of note, this structure was present on the prior echo from March 2023.   2. LV myocardium is hyper trabeculated. Consider contrast echo or MRI to   exclude noncompaction. Left ventricular ejection fraction, by estimation,  is 30 to 35%. The left ventricle has moderately decreased function. The  left ventricle demonstrates global   hypokinesis. Indeterminate diastolic filling due to E-A fusion.   3. Right ventricular systolic function is low normal. The right  ventricular size is normal. There is normal pulmonary artery systolic  pressure. The estimated right ventricular systolic pressure is [id number].   4. Left atrial size was severely dilated.   5. Mild to moderate mitral regurgitation is present on this study. The  prior study demonstrated possible moderate to severe MR. Would recommend  TEE for clarification. The mitral valve is grossly normal. Mild to  moderate mitral valve regurgitation. No  evidence of mitral stenosis.   6. The aortic valve is tricuspid. There is mild calcification of the  aortic valve. Aortic valve regurgitation is not visualized. Aortic valve  sclerosis is present, with no evidence of aortic valve stenosis.   7. The inferior vena cava is normal in size with greater than 50%  respiratory variability, suggesting right atrial pressure of 3 mmHg.  Patient Profile     56 y.o. male with a hx of bradycardia, hypertension, abdominal wall hernias, GERD, polysubstance abuse (cocaine, smoking), and B12 deficiency who was seen 06/30/2021 for the evaluation of new onset atrial fibrillation at the request of Dr. Broadus John  Assessment & Plan    Atrial fibrillation /flutter with RVR - He has since converted to sinus rhythm in the 80s - Suspect his episode of atrial fibrillation/flutter was due to infection + recent surgery +/- acute CHF - Metoprolol titrated to 50 mg BID - agree with this and may continue to titrate as needed, BP stable  - stressed the importance of avoiding cocaine - Mg has been low this admission (1.6 on 5/21), supplemented mag yesterday  - will avoid antiarrhythmics while not on anticoagulation - Not on  any anticoagulation at this time due to recent C-spine surgery/infection. Since Afib occurred in the setting of infection, recent surgery-- may not need AC if he does not have recurrence of afib. CHADS-VASc=1  - Additionally, patient has a history of poor compliance and substance abuse and is a poor long-term anticoagulation candidate.     Acute systolic heart failure Acute respiratory failure - now resolved -  LVEF 30 to 35% this admission, normal on echo 04/2021 - BNP > 4500 - hsTn 57>>55. Trop elevation mild and flat inconsistent with ACS - Due to new systolic heart failure, will eventually need an ischemic evaluation. However, patient will need to demonstrate compliance given his multiple recent AMA discharges and substance abuse. Also has been noncompliant with fluid restrictions, fall precautions, and bed alarm.  - Would like to start GDMT as BP allows - Continue metoprolol 50 mg BID for now, plan to transition to succinate prior to discharge  - Would like to add on ARB, but will need to stress compliance with follow up appointments and labs. BP is somewhat labile, so will hold off for now as patient continues to receive IV diuretics - agree with 40 mg IV lasix BID --> output 3.35 L urine yesterday and is net -10.4 L since admission. Renal function stable. Continue to have crackles in lungs and trace ankle edema, but can likely be transitioned to oral lasix soon      MSSA bacteremia - ID following - TEE deferred in the postoperative period - Patient is a difficult placement and will need to stay inpatient for the duration of his IV antibiotics.   Right hand / arm swelling - radial pulse intact - UE warm and dry - duplex negative for DVT      For questions or updates, please contact [facility] Please consult [email or web address] for contact info under        Signed, Margie Billet, PA-C  07/01/2021, 9:28 AM    Personally seen and examined. Agree with above.  Laying on his  side in bed, appears fairly comfortable.  He is worried about potential PICC line.  States that he cannot tolerate that. Acute systolic heart failure-EF 35%. Currently in sinus rhythm. Continuing with IV Lasix 40 twice daily.  Not ready yet to proceed with other goal-directed medical therapy such as ARB.  Creatinine is stable.  10 L out since admission.  Excellent.  Minimal crackles in lungs. Right arm swelling negative for DVT. Unable to perform TEE at this point given recent cervical spine surgery and potential retropharyngeal abscess.  Continue with antibiotics per ID. Not a good long-term anticoagulation candidate.  Candee Furbish, MD

## 2021-07-01 NOTE — Progress Notes (Signed)
PROGRESS NOTE    Logan Nelson  [id number] DOB: Jun 17, 1965 DOA: 06/16/2021 PCP: [name], MD  56 y.o. male with past medical history of severe protein calorie malnutrition, hypertension, anasarca, abdominal wall hernia presented to the hospital with multiple falls and swelling. -Recently hospitalized for incarcerated abdominal hernia, had primary repair but left AMA before he could be placed in rehab -Admitted 4/3 with severe debility, protein calorie malnutrition and failure to thrive [facility] course complicated by sepsis, MSSA bacteremia - ID following.  Will be undergoing MRI of the spine and sacrum to rule out osteomyelitis.2D Echo also showed worsening EF negative for vegetation -On 5/10 he left AMA, back in the ER same night -MRI thoracic lumbar and sacral spine noted concern for coccygeal osteomyelitis and cervical spine epidural abscess without cord compression, seen by neurosurgery, underwent anterior C4/C5 decompression, arthrodesis C4-C5 with 7 mm structural allograft and anterior instrumentation, TEE was deferred acutely in the setting of recent C-spine surgery -5/24 AM developed transient rapid A-fib  Subjective: -Feels okay, reluctant to get PICC line placed, worried about all potential complications, denies any shoulder pain, has mild discomfort in his right wrist  Assessment and Plan:  MSSA bacteremia  Cervical spine epidural abscess Coccygeal osteomyelitis -Neurosurgery consulted underwent anterior cervical decompression, abscess evacuation and instrumentation 5/17, OR cultures grew MSSA -Remains on IV cefazolin to complete 6-week course on 6/28 -Seen by cardiology, felt to be high risk for TEE at this time, will require long-term antibiotic course regardless -Repeat blood cultures are negative -I spent 20 minutes talking about PICC line and its importance prior to discharge to rehab, will order PRN Ativan  Infected sacral decubitus ulcer -Completed 10  days of IV Unasyn, currently on IV Ancef for MSSA bacteremia -Continue local wound care  Right forearm swelling and report of right shoulder pain -CT right shoulder showed severe glenohumeral osteoarthritis with small effusion -Duplex negative for DVT -Patient denies any pain or discomfort in the shoulder any longer, has full range of motion, monitor clinically  Acute systolic congestive heart failure: Echocardiogram 3/23 with ejection fraction 60 to 65% Repeat echocardiogram with 30 to 35%. -Improving on diuretics, he is 10.3 L negative, continue IV Lasix today -Started on Aldactone yesterday -Not a good candidate for SGLT2 he is high risk of UTI in the setting of above issues -Per cards not appropriate for TEE in the setting of recent C spine surgery -Would benefit from cardiology FU  Transient A-fib RVR, 5/24 early a.m. -Back in sinus rhythm, continue metoprolol -Appreciate cardiology input, called and discussed with neurosurgery Dr. Christella Noa 5/25, he is okay with systemic anticoagulation at this time   Severe protein calorie malnutrition: Continue supplements  Severe debility, frailty: Continue PT OT, SNF recommended TOC consulting,.  Patient will need prolonged antibiotics.    Essential hypertension: Blood pressure are stable.   Anemia of chronic disease: 3 PRBC transfusion during previous hospitalization.  No active bleeding.  Continue to monitor.   Hypomagnesemia: Replaced and adequate.      DVT prophylaxis: Heparin SQ   Code Status: Full code Family Communication: None. Disposition Plan: SNF likely 3 days Remains inpatient appropriate because: Treatment for bacteremia.  Unsafe discharge.      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 Subcutaneous fat may be visible but bone, tendon or muscle are NOT exposed. 2 in vertical by .5 in horizontal--approximately--wound tunneling and pink (Active)  06/17/21 1159  Location: Sacrum  Location  Orientation:   Staging:  Stage 3 -  Full thickness tissue loss. Subcutaneous fat may be visible but bone, tendon or muscle are NOT exposed.  Wound Description (Comments): 2 in vertical by .5 in horizontal--approximately--wound tunneling and pink  Present on Admission:       Consultants:  Infectious disease Neurosurgery ENT,   Procedures: 5/17 PROCEDURE:  Anterior Cervical decompression C4/5 Arthrodesis C4-5 with 53mm structural allograft Anterior instrumentation(Nuvasive) C4-5   SURGEON:   Surgeon(s): Ashok Pall, MD Eustace Moore, MD   Objective: Vitals:   06/30/21 1800 06/30/21 2123 07/01/21 0141 07/01/21 0813  BP: 120/85 114/78  (!) 128/95  Pulse: 89   88  Resp: 18 20  19   Temp: 98.2 F (36.8 C) 98.1 F (36.7 C)  98.3 F (36.8 C)  TempSrc: Oral Oral  Oral  SpO2: 100% 97%  95%  Weight:   66.1 kg   Height:        Intake/Output Summary (Last 24 hours) at 07/01/2021 1150 Last data filed at 07/01/2021 [id number] Gross per 24 hour  Intake 440 ml  Output 4350 ml  Net -3910 ml   Filed Weights   06/29/21 0400 06/30/21 0111 07/01/21 0141  Weight: 68 kg 65.4 kg 66.1 kg    Examination:  General exam: Chronically ill male cachectic laying in bed, AAOx3, no distress HEENT: Positive JVD CVS: S1-S2, regular rhythm Lungs: Few basilar rales, otherwise clear Abdomen: Soft, nontender, bowel sounds present  Extremities: Right forearm swelling noted, good range of motion of right shoulder without any tenderness or discomfort Psychiatry:  Mood & affect appropriate.     Data Reviewed:   CBC: Recent Labs  Lab 06/25/21 0716 06/26/21 0326 06/27/21 0055 06/29/21 0152 06/30/21 0340 07/01/21 0414  WBC 14.6* 11.3* 9.7 13.5* 10.1 10.4  NEUTROABS 10.9* 6.9 6.6 9.4* 5.5  --   HGB 9.1* 8.9* 8.8* 9.6* 8.5* 9.8*  HCT 29.7* 29.4* 28.8* 31.7* 28.2* 32.3*  MCV 94.9 95.5 95.4 94.3 96.2 97.0  PLT 423* 395 373 423* 352 [id number]   Basic Metabolic Panel: Recent Labs  Lab 06/27/21 0055  06/28/21 1034 06/29/21 0152 06/30/21 0340 07/01/21 0414  NA 141 138 139 138 138  K 3.3* 4.0 4.5 4.0 4.1  CL 110 106 108 107 105  CO2 27 26 25 26 26   GLUCOSE 119* 120* 103* 92 79  BUN 10 11 13 10 14   CREATININE 0.40* 0.45* 0.53* 0.48* 0.50*  CALCIUM 8.2* 8.9 8.7* 8.3* 8.6*  MG 1.6*  --  1.8 1.7  --   PHOS  --   --  3.0 2.6  --    GFR: Estimated Creatinine Clearance: 87.6 mL/min (A) (by C-G formula based on SCr of 0.5 mg/dL (L)). Liver Function Tests: No results for input(s): AST, ALT, ALKPHOS, BILITOT, PROT, ALBUMIN in the last 168 hours. No results for input(s): LIPASE, AMYLASE in the last 168 hours. No results for input(s): AMMONIA in the last 168 hours. Coagulation Profile: No results for input(s): INR, PROTIME in the last 168 hours. Cardiac Enzymes: No results for input(s): CKTOTAL, CKMB, CKMBINDEX, TROPONINI in the last 168 hours. BNP (last 3 results) No results for input(s): PROBNP in the last 8760 hours. HbA1C: No results for input(s): HGBA1C in the last 72 hours. CBG: No results for input(s): GLUCAP in the last 168 hours. Lipid Profile: No results for input(s): CHOL, HDL, LDLCALC, TRIG, CHOLHDL, LDLDIRECT in the last 72 hours. Thyroid Function Tests: No results for input(s): TSH, T4TOTAL, FREET4, T3FREE, THYROIDAB in the last 72 hours.  Anemia Panel: No results for input(s): VITAMINB12, FOLATE, FERRITIN, TIBC, IRON, RETICCTPCT in the last 72 hours. Urine analysis:    Component Value Date/Time   COLORURINE AMBER (A) 06/17/2021 1007   APPEARANCEUR HAZY (A) 06/17/2021 1007   LABSPEC 1.028 06/17/2021 1007   PHURINE 5.0 06/17/2021 1007   GLUCOSEU NEGATIVE 06/17/2021 1007   HGBUR LARGE (A) 06/17/2021 1007   BILIRUBINUR NEGATIVE 06/17/2021 1007   KETONESUR NEGATIVE 06/17/2021 1007   PROTEINUR NEGATIVE 06/17/2021 1007   NITRITE NEGATIVE 06/17/2021 1007   LEUKOCYTESUR NEGATIVE 06/17/2021 1007   Sepsis Labs: @LABRCNTIP (procalcitonin:4,lacticidven:4)  ) Recent  Results (from the past 240 hour(s))  Surgical pcr screen     Status: None   Collection Time: 06/23/21 10:50 AM   Specimen: Nasal Mucosa; Nasal Swab  Result Value Ref Range Status   MRSA, PCR NEGATIVE NEGATIVE Final   Staphylococcus aureus NEGATIVE NEGATIVE Final    Comment: (NOTE) The Xpert SA Assay (FDA approved for NASAL specimens in patients 92 years of age and older), is one component of a comprehensive surveillance program. It is not intended to diagnose infection nor to guide or monitor treatment. Performed at [facility], [place] [address] [place], [place] [address]   Aerobic/Anaerobic Culture w Gram Stain (surgical/deep wound)     Status: None   Collection Time: 06/23/21  1:49 PM   Specimen: Soft Tissue, Other  Result Value Ref Range Status   Specimen Description TISSUE  Final   Special Requests ANTERIOR CERVICAL AND EPIDURAL ABSCESS  Final   Gram Stain NO WBC SEEN NO ORGANISMS SEEN   Final   Culture   Final    FEW STAPHYLOCOCCUS AUREUS CRITICAL RESULT CALLED TO, READ BACK BY AND VERIFIED WITH: RN ASHLEY.S AT 1139 ON 06/24/2021 BY T.SAAD. NO ANAEROBES ISOLATED Performed at [facility], [place] [address] [place], [place] [address]    Report Status 06/28/2021 FINAL  Final   Organism ID, Bacteria STAPHYLOCOCCUS AUREUS  Final      Susceptibility   Staphylococcus aureus - MIC*    CIPROFLOXACIN <=0.5 SENSITIVE Sensitive     ERYTHROMYCIN >=8 RESISTANT Resistant     GENTAMICIN <=0.5 SENSITIVE Sensitive     OXACILLIN <=0.25 SENSITIVE Sensitive     TETRACYCLINE <=1 SENSITIVE Sensitive     VANCOMYCIN 1 SENSITIVE Sensitive     TRIMETH/SULFA <=10 SENSITIVE Sensitive     CLINDAMYCIN RESISTANT Resistant     RIFAMPIN <=0.5 SENSITIVE Sensitive     Inducible Clindamycin POSITIVE Resistant     * FEW STAPHYLOCOCCUS AUREUS  Aerobic/Anaerobic Culture w Gram Stain (surgical/deep wound)     Status: None   Collection Time: 06/23/21  2:32 PM   Specimen: PATH Other; Tissue   Result Value Ref Range Status   Specimen Description TISSUE  Final   Special Requests NONE  Final   Gram Stain   Final    RARE WBC PRESENT, PREDOMINANTLY MONONUCLEAR RARE GRAM POSITIVE COCCI Performed at [facility], [address] [place], [place] [address]    Culture   Final    FEW STAPHYLOCOCCUS AUREUS SUSCEPTIBILITIES PERFORMED ON PREVIOUS CULTURE WITHIN THE LAST 5 DAYS. NO ANAEROBES ISOLATED; CULTURE IN PROGRESS FOR 5 DAYS    Report Status 06/28/2021 FINAL  Final     Radiology Studies: [name] EKG SITE RITE  Result Date: 06/29/2021 If Site Rite image not attached, placement could not be confirmed due to current cardiac rhythm.    Scheduled Meds:  (feeding supplement) PROSource Plus  30 mL Oral BID BM   feeding supplement  1 Container Oral TID BM   furosemide  40 mg Intravenous BID   heparin injection (subcutaneous)  5,000 Units Subcutaneous Q8H   lidocaine  1 patch Transdermal Q24H   metoprolol tartrate  50 mg Oral BID   multivitamin with minerals  1 tablet Oral Daily   potassium chloride  40 mEq Oral BID   senna-docusate  2 tablet Oral BID   spironolactone  25 mg Oral Daily   Continuous Infusions:  sodium chloride 10 mL/hr at 06/28/21 1830    ceFAZolin (ANCEF) IV 2 g (07/01/21 0821)     LOS: 14 days    Time spent: 105min  Domenic Polite, MD [facility]   07/01/2021, 11:50 AM

## 2021-07-01 NOTE — Progress Notes (Signed)
Picc line placed in R arm, pt had some swelling in the R hand and wrist, no swelling in the upper arm where picc was placed.  MD was notified of Picc placement and was ok for Picc line to be placed there, just told RN to continue to watch arm.  Will continue to monitor, Thanks Lavonda Jumbo RN.

## 2021-07-01 NOTE — Progress Notes (Signed)
Mobility Specialist Progress Note:   07/01/21 1428  Mobility  Activity Ambulated with assistance in hallway  Level of Assistance Standby assist, set-up cues, supervision of patient - no hands on  Assistive Device Front wheel walker  Distance Ambulated (ft) 600 ft  Activity Response Tolerated well  $Mobility charge 1 Mobility   Pt received in bed willing to participate in mobility. No complaints of pain. Left in bed with call bell in reach and all needs met.   [facility] Teofilo Lupinacci Mobility Specialist

## 2021-07-02 ENCOUNTER — Encounter (HOSPITAL_COMMUNITY): Payer: Self-pay | Admitting: Neurosurgery

## 2021-07-02 DIAGNOSIS — L89302 Pressure ulcer of unspecified buttock, stage 2: Secondary | ICD-10-CM | POA: Diagnosis not present

## 2021-07-02 DIAGNOSIS — M00011 Staphylococcal arthritis, right shoulder: Secondary | ICD-10-CM | POA: Diagnosis not present

## 2021-07-02 DIAGNOSIS — B9561 Methicillin susceptible Staphylococcus aureus infection as the cause of diseases classified elsewhere: Secondary | ICD-10-CM | POA: Diagnosis not present

## 2021-07-02 DIAGNOSIS — R7881 Bacteremia: Secondary | ICD-10-CM | POA: Diagnosis not present

## 2021-07-02 DIAGNOSIS — I5021 Acute systolic (congestive) heart failure: Secondary | ICD-10-CM | POA: Diagnosis not present

## 2021-07-02 DIAGNOSIS — G061 Intraspinal abscess and granuloma: Secondary | ICD-10-CM | POA: Diagnosis not present

## 2021-07-02 LAB — CBC
HCT: 31.1 % — ABNORMAL LOW (ref 39.0–52.0)
Hemoglobin: 9.5 g/dL — ABNORMAL LOW (ref 13.0–17.0)
MCH: 29.7 pg (ref 26.0–34.0)
MCHC: 30.5 g/dL (ref 30.0–36.0)
MCV: 97.2 fL (ref 80.0–100.0)
Platelets: 351 10*3/uL (ref 150–400)
RBC: 3.2 MIL/uL — ABNORMAL LOW (ref 4.22–5.81)
RDW: 18.8 % — ABNORMAL HIGH (ref 11.5–15.5)
WBC: 11.5 10*3/uL — ABNORMAL HIGH (ref 4.0–10.5)
nRBC: 0 % (ref 0.0–0.2)

## 2021-07-02 NOTE — Progress Notes (Signed)
PROGRESS NOTE    Logan Nelson  [id number] DOB: [birth year] DOA: 06/16/2021 PCP: [name], MD  56 y.o. male with past medical history of severe protein calorie malnutrition, hypertension, anasarca, abdominal wall hernia presented to the hospital with multiple falls and swelling. -Recently hospitalized for incarcerated abdominal hernia, had primary repair but left AMA before he could be placed in rehab -Admitted 4/3 with severe debility, protein calorie malnutrition and failure to thrive [facility] course complicated by sepsis, MSSA bacteremia - ID following.  Will be undergoing MRI of the spine and sacrum to rule out osteomyelitis.2D Echo also showed worsening EF negative for vegetation -On 5/10 he left AMA, back in the ER same night -MRI thoracic lumbar and sacral spine noted concern for coccygeal osteomyelitis and cervical spine epidural abscess without cord compression, seen by neurosurgery, underwent anterior C4/C5 decompression, arthrodesis C4-C5 with 7 mm structural allograft and anterior instrumentation, TEE was deferred acutely in the setting of recent C-spine surgery -5/24 AM developed transient rapid A-fib  Subjective: -Feels okay, reluctant to get PICC line placed, worried about all potential complications, denies any shoulder pain, has mild discomfort in his right wrist  Assessment and Plan:  MSSA bacteremia  Cervical spine epidural abscess Coccygeal osteomyelitis -Neurosurgery consulted underwent anterior cervical decompression, abscess evacuation and instrumentation 5/17, OR cultures grew MSSA -Remains on IV cefazolin to complete 6-week course on 6/28 -Seen by cardiology, felt to be high risk for TEE at this time, will require long-term antibiotic course regardless -Repeat blood cultures are negative -PICC line placed 5/25  Infected sacral decubitus ulcer -Completed 10 days of IV Unasyn, currently on IV Ancef for MSSA bacteremia -Continue local wound  care  Right forearm swelling and report of right shoulder pain -CT right shoulder showed severe glenohumeral osteoarthritis with small effusion -Duplex negative for DVT -Patient denies any pain or discomfort in the shoulder any longer, has full range of motion, monitor clinically  Acute systolic congestive heart failure: Echocardiogram 3/23 with ejection fraction 60 to 65% Repeat echocardiogram with 30 to 35%. -Improving on diuretics, he is 10.3 L negative, continue IV Lasix today -Started on Aldactone  -Not a good candidate for SGLT2 he is high risk of UTI in the setting of above issues -Per cards not appropriate for TEE in the setting of recent C spine surgery -Would benefit from cardiology FU  Transient A-fib RVR, 5/24 early a.m. -Back in sinus rhythm, continue metoprolol -Appreciate cardiology input, called and discussed with neurosurgery Dr. Christella Noa 5/25, he is okay with systemic anticoagulation at this time -Discussed with cards, not a good long-term anticoagulation candidate, fortunately remains in sinus rhythm   Severe protein calorie malnutrition: Continue supplements  Severe debility, frailty: Continue PT OT, SNF recommended TOC consulting,.  Patient will need prolonged antibiotics.    Essential hypertension: Blood pressure are stable.   Anemia of chronic disease: 3 PRBC transfusion during previous hospitalization.  No active bleeding.  -Stable, monitor   Hypomagnesemia: Replaced and adequate.      DVT prophylaxis: Heparin SQ   Code Status: Full code Family Communication: None. 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 Subcutaneous fat may be visible but bone, tendon or muscle are NOT exposed. 2 in vertical by .5 in horizontal--approximately--wound tunneling and pink (Active)  06/17/21 1159  Location: Sacrum  Location Orientation:   Staging: Stage 3 -  Full thickness tissue loss. Subcutaneous  fat may be visible  but bone, tendon or muscle are NOT exposed.  Wound Description (Comments): 2 in vertical by .5 in horizontal--approximately--wound tunneling and pink  Present on Admission:       Consultants:  Infectious disease Neurosurgery ENT,   Procedures: 5/17 PROCEDURE:  Anterior Cervical decompression C4/5 Arthrodesis C4-5 with 33mm structural allograft Anterior instrumentation(Nuvasive) C4-5   SURGEON:   Surgeon(s): Ashok Pall, MD Eustace Moore, MD   Objective: Vitals:   07/02/21 0152 07/02/21 0400 07/02/21 0705 07/02/21 0914  BP:  (!) 124/94 (!) 135/101 (!) 126/91  Pulse:  84 90 91  Resp:  18  16  Temp: 98.3 F (36.8 C) 98.8 F (37.1 C)  98.3 F (36.8 C)  TempSrc:  Oral  Oral  SpO2:  100%  100%  Weight:  65.5 kg    Height:        Intake/Output Summary (Last 24 hours) at 07/02/2021 1103 Last data filed at 07/02/2021 1045 Gross per 24 hour  Intake 1310 ml  Output 4910 ml  Net -3600 ml   Filed Weights   06/30/21 0111 07/01/21 0141 07/02/21 0400  Weight: 65.4 kg 66.1 kg 65.5 kg    Examination:  General exam: Chronically ill cachectic male laying in bed, AAOx3, no distress HEENT: Positive JVD CVS: S1-S2, regular rate rhythm Lungs: Few basilar rales, otherwise clear Abdomen: Soft, nontender, bowel sounds present Extremities: Scant right arm swelling, right arm PICC line noted  Psychiatry:  Mood & affect appropriate.     Data Reviewed:   CBC: Recent Labs  Lab 06/26/21 0326 06/27/21 0055 06/29/21 0152 06/30/21 0340 07/01/21 0414 07/02/21 0422  WBC 11.3* 9.7 13.5* 10.1 10.4 11.5*  NEUTROABS 6.9 6.6 9.4* 5.5  --   --   HGB 8.9* 8.8* 9.6* 8.5* 9.8* 9.5*  HCT 29.4* 28.8* 31.7* 28.2* 32.3* 31.1*  MCV 95.5 95.4 94.3 96.2 97.0 97.2  PLT 395 373 423* 352 373 [id number]   Basic Metabolic Panel: Recent Labs  Lab 06/27/21 0055 06/28/21 1034 06/29/21 0152 06/30/21 0340 07/01/21 0414  NA 141 138 139 138 138  K 3.3* 4.0 4.5 4.0 4.1  CL 110 106  108 107 105  CO2 27 26 25 26 26   GLUCOSE 119* 120* 103* 92 79  BUN 10 11 13 10 14   CREATININE 0.40* 0.45* 0.53* 0.48* 0.50*  CALCIUM 8.2* 8.9 8.7* 8.3* 8.6*  MG 1.6*  --  1.8 1.7  --   PHOS  --   --  3.0 2.6  --    GFR: Estimated Creatinine Clearance: 87.6 mL/min (A) (by C-G formula based on SCr of 0.5 mg/dL (L)). Liver Function Tests: No results for input(s): AST, ALT, ALKPHOS, BILITOT, PROT, ALBUMIN in the last 168 hours. No results for input(s): LIPASE, AMYLASE in the last 168 hours. No results for input(s): AMMONIA in the last 168 hours. Coagulation Profile: No results for input(s): INR, PROTIME in the last 168 hours. Cardiac Enzymes: No results for input(s): CKTOTAL, CKMB, CKMBINDEX, TROPONINI in the last 168 hours. BNP (last 3 results) No results for input(s): PROBNP in the last 8760 hours. HbA1C: No results for input(s): HGBA1C in the last 72 hours. CBG: No results for input(s): GLUCAP in the last 168 hours. Lipid Profile: No results for input(s): CHOL, HDL, LDLCALC, TRIG, CHOLHDL, LDLDIRECT in the last 72 hours. Thyroid Function Tests: No results for input(s): TSH, T4TOTAL, FREET4, T3FREE, THYROIDAB in the last 72 hours. Anemia Panel: No results for input(s): VITAMINB12, FOLATE, FERRITIN, TIBC, IRON, RETICCTPCT in the last  72 hours. Urine analysis:    Component Value Date/Time   COLORURINE AMBER (A) 06/17/2021 1007   APPEARANCEUR HAZY (A) 06/17/2021 1007   LABSPEC 1.028 06/17/2021 1007   PHURINE 5.0 06/17/2021 1007   GLUCOSEU NEGATIVE 06/17/2021 1007   HGBUR LARGE (A) 06/17/2021 1007   BILIRUBINUR NEGATIVE 06/17/2021 1007   KETONESUR NEGATIVE 06/17/2021 1007   PROTEINUR NEGATIVE 06/17/2021 1007   NITRITE NEGATIVE 06/17/2021 1007   LEUKOCYTESUR NEGATIVE 06/17/2021 1007   Sepsis Labs: @LABRCNTIP (procalcitonin:4,lacticidven:4)  ) Recent Results (from the past 240 hour(s))  Surgical pcr screen     Status: None   Collection Time: 06/23/21 10:50 AM   Specimen:  Nasal Mucosa; Nasal Swab  Result Value Ref Range Status   MRSA, PCR NEGATIVE NEGATIVE Final   Staphylococcus aureus NEGATIVE NEGATIVE Final    Comment: (NOTE) The Xpert SA Assay (FDA approved for NASAL specimens in patients 31 years of age and older), is one component of a comprehensive surveillance program. It is not intended to diagnose infection nor to guide or monitor treatment. Performed at [facility], [place] [address] [place], [place] [address]   Aerobic/Anaerobic Culture w Gram Stain (surgical/deep wound)     Status: None   Collection Time: 06/23/21  1:49 PM   Specimen: Soft Tissue, Other  Result Value Ref Range Status   Specimen Description TISSUE  Final   Special Requests ANTERIOR CERVICAL AND EPIDURAL ABSCESS  Final   Gram Stain NO WBC SEEN NO ORGANISMS SEEN   Final   Culture   Final    FEW STAPHYLOCOCCUS AUREUS CRITICAL RESULT CALLED TO, READ BACK BY AND VERIFIED WITH: RN ASHLEY.S AT 1139 ON 06/24/2021 BY T.SAAD. NO ANAEROBES ISOLATED Performed at [facility], [place] [address] [place], [place] [address]    Report Status 06/28/2021 FINAL  Final   Organism ID, Bacteria STAPHYLOCOCCUS AUREUS  Final      Susceptibility   Staphylococcus aureus - MIC*    CIPROFLOXACIN <=0.5 SENSITIVE Sensitive     ERYTHROMYCIN >=8 RESISTANT Resistant     GENTAMICIN <=0.5 SENSITIVE Sensitive     OXACILLIN <=0.25 SENSITIVE Sensitive     TETRACYCLINE <=1 SENSITIVE Sensitive     VANCOMYCIN 1 SENSITIVE Sensitive     TRIMETH/SULFA <=10 SENSITIVE Sensitive     CLINDAMYCIN RESISTANT Resistant     RIFAMPIN <=0.5 SENSITIVE Sensitive     Inducible Clindamycin POSITIVE Resistant     * FEW STAPHYLOCOCCUS AUREUS  Aerobic/Anaerobic Culture w Gram Stain (surgical/deep wound)     Status: None   Collection Time: 06/23/21  2:32 PM   Specimen: PATH Other; Tissue  Result Value Ref Range Status   Specimen Description TISSUE  Final   Special Requests NONE  Final   Gram Stain   Final     RARE WBC PRESENT, PREDOMINANTLY MONONUCLEAR RARE GRAM POSITIVE COCCI Performed at [facility], [address], [place], [place] [address]    Culture   Final    FEW STAPHYLOCOCCUS AUREUS SUSCEPTIBILITIES PERFORMED ON PREVIOUS CULTURE WITHIN THE LAST 5 DAYS. NO ANAEROBES ISOLATED; CULTURE IN PROGRESS FOR 5 DAYS    Report Status 06/28/2021 FINAL  Final     Radiology Studies: No results found.   Scheduled Meds:  (feeding supplement) PROSource Plus  30 mL Oral BID BM   Chlorhexidine Gluconate Cloth  6 each Topical Daily   feeding supplement  1 Container Oral TID BM   furosemide  40 mg Intravenous BID   heparin injection (  subcutaneous)  5,000 Units Subcutaneous Q8H   lidocaine  1 patch Transdermal Q24H   metoprolol tartrate  50 mg Oral BID   multivitamin with minerals  1 tablet Oral Daily   potassium chloride  40 mEq Oral BID   senna-docusate  2 tablet Oral BID   sodium chloride flush  10-40 mL Intracatheter Q12H   spironolactone  25 mg Oral Daily   Continuous Infusions:  sodium chloride 10 mL/hr at 06/28/21 1830    ceFAZolin (ANCEF) IV 2 g (07/02/21 0435)     LOS: 15 days    Time spent: 60min  Domenic Polite, MD [facility]   07/02/2021, 11:03 AM

## 2021-07-02 NOTE — Progress Notes (Signed)
   07/01/21 2012  Assess: MEWS Score  BP (!) 141/87  Pulse Rate (!) 114  Resp 18  O2 Device Room Air  Assess: MEWS Score  MEWS Temp 0  MEWS Systolic 0  MEWS Pulse 2  MEWS RR 0  MEWS LOC 0  MEWS Score 2  MEWS Score Color Yellow  Assess: if the MEWS score is Yellow or Red  Were vital signs taken at a resting state? Yes  Focused Assessment No change from prior assessment  Early Detection of Sepsis Score *See Row Information* Low  MEWS guidelines implemented *See Row Information* No, other (Comment) (pt was worried about the Picc line he had to get)  Document  Patient Outcome Stabilized after interventions  Progress note created (see row info) Yes

## 2021-07-02 NOTE — Progress Notes (Signed)
Heart Failure Stewardship Pharmacist Progress Note   PCP: [name], MD PCP-Cardiologist: Orbie Pyo, MD    HPI:  56 yo male with PMH of severe protein malnutrition, HTN, anasarca, abdominal wall hernia, bradycardia, GERD, polysubstance use (cocaine, smoking) who presented to hospital after a fall and generalized weakness after leaving AMA multiple times the past two months.  He was initially admitted 03/05 for an incarcerated abdominal hernia secondary to a fall with a positive UDS for BZD, cocaine, and THC.  Was discharged with plans to improve nutrition and schedule outpatient surgical intervention but was hospitalized again 03/17 for an open primary repair but left AMA 03/29 before getting placed in rehab.  At this time, his LVEF was normal (60-65%) with mild LVH, moderate/severe MR, and normal RV.  He was hospitalized again on 04/03 after another fall in the setting of severe protein calorie malnutrition and failure to thrive.  This admission was complicated by sepsis secondary to MSSA bacteremia.  Patient was found to have BL LEE and required aggressive IV diuresis with net negative 24.45 L removed.  A repeat Echo 05/09 showed a significantly reduced LVEF of 30-35% from 2 months prior with new global hypokinesis, low-normal RV function, and improved MR to mild/moderate.  Cardiology recommended a cardiac MRI at this time as well as MRI of spine/sacrum to rule out osteomyelitis, but patient left AMA 05/10 before imaging could be obtained.  A few hours after leaving AMA 05/10, he presented to ED for readmission after falling at the bus stop outside the hospital.  Infectious workup revealed C4-5 epidural abscess with discitis and osteomyelitis and no TEE was obtained as patient was deemed high risk after undergoing spinal surgery on 05/17.  Since surgery, patient was getting IV diuresis but reported labored breathing and was tachypneic requiring 2L [employer].  BNP was elevated and CTA on 05/22  showed diffuse cardiac enlargement with bilateral pleural effusions.    Cardiology was consulted on 05/24 for new onset Afib where they noted crackles and JVD; Lopressor was increased to 50 mg BID.  Still with JVD on exam the next day, but continues to diurese well - spironolactone 25 mg daily added.   Current HF Medications: Diuretic: furosemide IV 40 mg BID Beta Blocker: Lopressor 50 mg BID Aldosterone Antagonist: spironolactone 25 mg daily Other: potassium 40 mEq BID  Prior to admission HF Medications: None  Pertinent Lab Values: (no BMP today - values from 05/25) Serum creatinine 0.5, BUN 14, Potassium 4.1, Sodium 138, BNP >4500, Magnesium 1.7, A1c 4%  Vital Signs: Weight: 144 lbs (admission weight: 148 lbs) Blood pressure: 120-40s/80-100s  Heart rate: 90-100  I/O: -3.3 L yesterday; net -12.39 L   Medication Assistance / Insurance Benefits Check: Does the patient have prescription insurance?  Yes Type of insurance plan: Medicaid  Outpatient Pharmacy:  Prior to admission outpatient pharmacy: none Is the patient willing to use [facility] TOC pharmacy at discharge? Yes Is the patient willing to transition their outpatient pharmacy to utilize a [facility] outpatient pharmacy?   Pending    Assessment: 1. Acute on chronic systolic CHF (LVEF 30-35%), due to medical noncompliance and substance use. NYHA class II-III symptoms. - Continue Lasix IV 40 mg BID - Continue Lopressor 50 mg BID - agree with switching to Toprol XL before discharge (will try to target 200 mg daily) - No ARNI with concern for patient compliance.  If BP continues to be elevated, would add on losartan 25 mg daily to optimize GDMT. -  Continue spironolactone 25 mg daily - No SGLT2i with concern for loss to follow up/overall compliance  - Continue daily potassium supplementation of 40 mEq daily to maintain K > 4   Plan: 1) Medication changes recommended at this time: - Add losartan 25 mg daily over the weekend for  additional BP control and GDMT optimization - Give IV Mg 2g x1 to keep Mg > 2. Recheck Mg with AM labs - Recheck BMP tomorrow before adding losartan  2) Patient assistance: - none pending  3)  Education  - To be completed prior to discharge  Filbert Schilder, PharmD PGY1 Pharmacy Resident 07/02/2021  4:21 PM

## 2021-07-02 NOTE — TOC Progression Note (Signed)
Transition of Care [facility]) - Progression Note    Patient Details  Name: Logan Nelson MRN: [id number] Date of Birth: 09/08/1965  [facility] CM/SW Contact  Lockie Pares, RN Phone Number: 07/02/2021, 2:35 PM  Clinical Narrative:     Patient complaining in room, states he going to leave AMA.  Went to room to speak to patient. He had a even voice with a small amount of frustration. He thought he was leaving for [place] [facility] today. He has family that is living near there. He mentioned signing out AMA to his nursing staff.  Made self available for venting and voicing concerns.  Encouraged him to stay the course and see this through Spoke to RN , MD , and CSW about conversation.       Expected Discharge Plan and Services        A couple of days to SNF with PICC line.                                          Social Determinants of Health (SDOH) Interventions Food Insecurity Interventions: Intervention Not Indicated Housing Interventions: Intervention Not Indicated Transportation Interventions: Intervention Not Indicated  Readmission Risk Interventions    07/02/2021    2:35 PM 06/08/2021    1:48 PM 05/05/2021   10:01 AM  Readmission Risk Prevention Plan  Transportation Screening  Complete Complete  Medication Review (RN Care Manager)  Referral to Pharmacy Referral to Pharmacy  PCP or Specialist appointment within 3-5 days of discharge  Complete Complete  HRI or Home Care Consult  Patient refused Patient refused  SW Recovery Care/Counseling Consult  Complete Complete  Palliative Care Screening  Not Applicable Not Applicable  Skilled Nursing Facility Complete Not Applicable Complete

## 2021-07-02 NOTE — Progress Notes (Addendum)
Mobility Specialist Progress Note:   07/02/21 1515  Mobility  Activity Ambulated with assistance in hallway  Level of Assistance Modified independent, requires aide device or extra time  Assistive Device Wheelchair  Distance Ambulated (ft) 550 ft  Activity Response Tolerated well  $Mobility charge 1 Mobility   Pt received in bed willing to participate in mobility. No complaints of pain. Left in bed with call bell in reach and all needs met.   [facility] Nargis Abrams Mobility Specialist

## 2021-07-02 NOTE — Progress Notes (Signed)
[facility]  Date of Admission:  06/16/2021           Reason for visit: Follow up on MSSA Bacteremia   Current antibiotics: Cefazolin    ASSESSMENT:    56 y.o. male admitted with:  MSSA bacteremia: Complicated by H0-6 epidural abscess with discitis/OM and OM of the coccyx.  Status post anterior cervical decompression and abscess evacuation with instrumentation on 06/23/21.  OR cultures grew MSSA. Currently on Cefazolin.  Per cardiology, patient is high risk for TEE and so this was not been obtained although unlikely to change antibiotic plan at this juncture. Infected sacral decubitus ulcer with OM of the coccyx: Completed a course of Unasyn x 10 days and now on Cefazolin for problem #1.  Right shoulder pain and UE swelling: CT shoulder shows severe glenohumeral joint osteoarthritis. Suspect small glenohumeral joint effusion, presumably reactive/degenerative. Septic arthritis is unable to be excluded by imaging, however, he has adequate ROM at this time.  Venous duplex to assess his swelling was negative for DVT.   RECOMMENDATIONS:    Continue cefazolin See OPAT below Will sign off, please call as needed  Diagnosis: Epidural abscess, discitis/OM, bacteremia, OM of the coccyx  Culture Result: MSSA  No Known Allergies  OPAT Orders Discharge antibiotics to be given via PICC line Discharge antibiotics: Per pharmacy protocol Cefazolin 2gm q8h  Duration: 6 weeks End Date: 08/04/21  [facility] Care Per Protocol:  Home health RN for IV administration and teaching; PICC line care and labs.    Labs weekly while on IV antibiotics: _xx_ CBC with differential _xx_ BMP __ CMP _xxx_ CRP xx_ ESR __ Vancomycin trough __ CK  _xx_ Please pull PIC at completion of IV antibiotics __ Please leave PIC in place until doctor has seen patient or been notified  Fax weekly labs to [phone]  Clinic Follow Up Appt: 07/29/21 @ 10:30am with Dr  West Bali    Principal Problem:   MSSA bacteremia Active Problems:   Hypertension   Protein-calorie malnutrition, severe   Anemia of chronic disease   Generalized weakness   Pressure injury of buttock, stage 2 (HCC)   Acute systolic (congestive) heart failure (HCC)   Prolonged QT interval   Epidural abscess   Abscess in epidural space of cervical spine    MEDICATIONS:    Scheduled Meds:  (feeding supplement) PROSource Plus  30 mL Oral BID BM   Chlorhexidine Gluconate Cloth  6 each Topical Daily   feeding supplement  1 Container Oral TID BM   furosemide  40 mg Intravenous BID   heparin injection (subcutaneous)  5,000 Units Subcutaneous Q8H   lidocaine  1 patch Transdermal Q24H   metoprolol tartrate  50 mg Oral BID   multivitamin with minerals  1 tablet Oral Daily   potassium chloride  40 mEq Oral BID   senna-docusate  2 tablet Oral BID   sodium chloride flush  10-40 mL Intracatheter Q12H   spironolactone  25 mg Oral Daily   Continuous Infusions:  sodium chloride 10 mL/hr at 06/28/21 1830    ceFAZolin (ANCEF) IV 2 g (07/02/21 0435)   PRN Meds:.sodium chloride, acetaminophen **OR** acetaminophen, bisacodyl, hydrALAZINE, LORazepam, magnesium hydroxide, menthol-cetylpyridinium **OR** phenol, morphine injection, oxyCODONE, polyethylene glycol, sodium chloride flush  SUBJECTIVE:   24 hour events:  PICC line placed yesterday Afebrile Stable labs No new micro No new imaging  He has no new issues.  He really wants to get out of the  hospital.  He had a PICC line placed.   Review of Systems  All other systems reviewed and are negative.    OBJECTIVE:   Blood pressure (!) 135/101, pulse 90, temperature 98.8 F (37.1 C), temperature source Oral, resp. rate 18, height _0  (1.651 m), weight 65.5 kg, SpO2 100 %. Body mass index is 24.01 kg/m.  Physical Exam Constitutional:      General: He is not in acute distress.    Appearance: Normal appearance.  HENT:     Head:  Normocephalic and atraumatic.     Mouth/Throat:     Comments: Dentition is poor.  Eyes:     Extraocular Movements: Extraocular movements intact.     Conjunctiva/sclera: Conjunctivae normal.  Pulmonary:     Effort: Pulmonary effort is normal. No respiratory distress.  Abdominal:     General: There is no distension.     Palpations: Abdomen is soft.  Musculoskeletal:     Cervical back: Normal range of motion and neck supple.     Comments: Right wrist with swelling but ROM is preserved.  No fluctuance, tenderness, drainage.  Right UE PICC in place.   Skin:    General: Skin is warm and dry.     Findings: No rash.  Neurological:     General: No focal deficit present.     Mental Status: He is alert and oriented to person, place, and time.  Psychiatric:        Mood and Affect: Mood normal.        Behavior: Behavior normal.     Lab Results: Lab Results  Component Value Date   WBC 11.5 (H) 07/02/2021   HGB 9.5 (L) 07/02/2021   HCT 31.1 (L) 07/02/2021   MCV 97.2 07/02/2021   PLT 351 07/02/2021    Lab Results  Component Value Date   NA 138 07/01/2021   K 4.1 07/01/2021   CO2 26 07/01/2021   GLUCOSE 79 07/01/2021   BUN 14 07/01/2021   CREATININE 0.50 (L) 07/01/2021   CALCIUM 8.6 (L) 07/01/2021   GFRNONAA >60 07/01/2021   GFRAA >60 01/23/2019    Lab Results  Component Value Date   ALT 13 06/23/2021   AST 20 06/23/2021   ALKPHOS 72 06/23/2021   BILITOT 0.8 06/23/2021       Component Value Date/Time   CRP 15.5 (H) 06/14/2021 1655       Component Value Date/Time   ESRSEDRATE 30 (H) 06/14/2021 1655     I have reviewed the micro and lab results in Epic.  Imaging: No results found.   Imaging independently reviewed in Epic.    [facility] [phone] pager 07/02/2021, 8:18 AM  I have personally spent 50 minutes involved in face-to-face and non-face-to-face activities for this patient on  the day of the visit. Professional time spent includes the following activities: Preparing to see the patient (review of tests), Obtaining and/or reviewing separately obtained history (admission/discharge record), Performing a medically appropriate examination and/or evaluation , Ordering medications/tests/procedures, referring and communicating with other health care professionals, Documenting clinical information in the EMR, Independently interpreting results (not separately reported), Communicating results to the patient/family/caregiver, Counseling and educating the patient/family/caregiver and Care coordination (not separately reported).

## 2021-07-02 NOTE — Progress Notes (Incomplete)
Heart Failure Nurse Navigator Progress Note  PCP: [name], MD PCP-Cardiologist: Lynnette Caffey Admission Diagnosis: None? Admitted from: Home   Presentation:   Logan Nelson presented with fall at bus stop , back pain, after a few hours of leaving AMA from admission of incarcerated abdominal hernia, malnutrition, FTT, MSSA bacteremia and sepsis with worsening EF. Was supposed to originally go to a rehab center. Patient had a PICC placed for extended IV antibiotics and plans to g to [facility] for rehab and long term antibiotic therapy.   Patient was educated on the sign and symptoms of heart failure, daily weights, when to call his doctor or go to the ER. Diet/ fluid restrictions, taking all his medications as prescribed and attending his medical appointments. Patient had no questions and stated he understood the education. Scheduled hospital follow up in HF TOC on 07/13/21 @ 11 am. (Per Dr. Jomarie Longs)  ECHO/ LVEF: 30-35%   Clinical Course:  Past Medical History:  Diagnosis Date   Anemia    Arthritis    Bradycardia    Dyspnea    Falling episodes    GERD (gastroesophageal reflux disease)    Hernia of abdominal wall    Hypertension    Lower extremity edema      Social History   Socioeconomic History   Marital status: Single    Spouse name: Not on file   Number of children: 1   Years of education: Not on file   Highest education level: High school graduate  Occupational History    Comment: pending disability  Tobacco Use   Smoking status: Former    Types: Cigarettes    Quit date: 04/28/2020    Years since quitting: 1.1   Smokeless tobacco: Never  Vaping Use   Vaping Use: Never used  Substance and Sexual Activity   Alcohol use: Not Currently    Comment: before admission   Drug use: Not Currently    Types: Cocaine    Comment: past history   Sexual activity: Not Currently  Other Topics Concern   Not on file  Social History Narrative   ** Merged History  Encounter **       Social Determinants of Health   Financial Resource Strain: Not on file  Food Insecurity: No Food Insecurity   Worried About Programme researcher, broadcasting/film/video in the Last Year: Never true   Ran Out of Food in the Last Year: Never true  Transportation Needs: No Transportation Needs   Lack of Transportation (Medical): No   Lack of Transportation (Non-Medical): No  Physical Activity: Not on file  Stress: Not on file  Social Connections: Not on file    High Risk Criteria for Readmission and/or Poor Patient Outcomes: Heart failure hospital admissions (last 6 months): 0  No Show rate: 6 % Difficult social situation: No  Demonstrates medication adherence: no Primary Language: English Literacy level: Reading, writing, comprehension  Barriers of Care:   Diet/fluid restrictions Daily weights Medication/ appointment compliance  Considerations/Referrals:   Referral made to Heart Failure Pharmacist Stewardship: yes Referral made to Heart Failure CSW/NCM TOC: yes Referral made to Heart & Vascular TOC clinic: yes 07/13/21 @ 11 am. (Per Dr. Jomarie Longs)  Items for Follow-up on DC/TOC: Medication understanding Diet/ fluid restrictions    Rhae Hammock, BSN, RN Heart Failure Print production planner Chat Only

## 2021-07-02 NOTE — Progress Notes (Signed)
Communicated to the primary RN, Ronalee Belts, that PICC line was placed to the rt arm. [facility] MD on call aware.

## 2021-07-02 NOTE — Progress Notes (Signed)
Progress Note  Patient Name: Logan Nelson Date of Encounter: 07/02/2021  [facility] Cardiologist: Orbie Pyo, MD   Subjective   Fairly comfortable, no chest pain.  Inpatient Medications    Scheduled Meds:  (feeding supplement) PROSource Plus  30 mL Oral BID BM   Chlorhexidine Gluconate Cloth  6 each Topical Daily   feeding supplement  1 Container Oral TID BM   furosemide  40 mg Intravenous BID   heparin injection (subcutaneous)  5,000 Units Subcutaneous Q8H   lidocaine  1 patch Transdermal Q24H   metoprolol tartrate  50 mg Oral BID   multivitamin with minerals  1 tablet Oral Daily   potassium chloride  40 mEq Oral BID   senna-docusate  2 tablet Oral BID   sodium chloride flush  10-40 mL Intracatheter Q12H   spironolactone  25 mg Oral Daily   Continuous Infusions:  sodium chloride 10 mL/hr at 06/28/21 1830    ceFAZolin (ANCEF) IV 2 g (07/02/21 0435)   PRN Meds: sodium chloride, acetaminophen **OR** acetaminophen, bisacodyl, hydrALAZINE, LORazepam, magnesium hydroxide, menthol-cetylpyridinium **OR** phenol, morphine injection, oxyCODONE, polyethylene glycol, sodium chloride flush   Vital Signs    Vitals:   07/02/21 0100 07/02/21 0152 07/02/21 0400 07/02/21 0705  BP: 132/88  (!) 124/94 (!) 135/101  Pulse: (!) 101  84 90  Resp: 18  18   Temp: 99.8 F (37.7 C) 98.3 F (36.8 C) 98.8 F (37.1 C)   TempSrc: Oral  Oral   SpO2: 99%  100%   Weight:   65.5 kg   Height:        Intake/Output Summary (Last 24 hours) at 07/02/2021 0910 Last data filed at 07/02/2021 0800 Gross per 24 hour  Intake 1310 ml  Output 4710 ml  Net -3400 ml      07/02/2021    4:00 AM 07/01/2021    1:41 AM 06/30/2021    1:11 AM  Last 3 Weights  Weight (lbs) 144 lb 4.8 oz 145 lb 12.8 oz 144 lb 2.9 oz  Weight (kg) 65.454 kg 66.134 kg 65.4 kg      Telemetry    SR - Personally Reviewed Physical Exam   GEN: No acute distress.   Neck: No JVD C collar Cardiac: RRR, no  murmurs, rubs, or gallops.  Respiratory: Clear to auscultation bilaterally. GI: Soft, nontender, non-distended  MS: No edema; No deformity. Neuro:  Nonfocal  Psych: Normal affect   Labs    High Sensitivity Troponin:   Recent Labs  Lab 06/28/21 1034 06/28/21 1236  TROPONINIHS 57* 55*     Chemistry Recent Labs  Lab 06/27/21 0055 06/28/21 1034 06/29/21 0152 06/30/21 0340 07/01/21 0414  NA 141   < > 139 138 138  K 3.3*   < > 4.5 4.0 4.1  CL 110   < > 108 107 105  CO2 27   < > 25 26 26   GLUCOSE 119*   < > 103* 92 79  BUN 10   < > 13 10 14   CREATININE 0.40*   < > 0.53* 0.48* 0.50*  CALCIUM 8.2*   < > 8.7* 8.3* 8.6*  MG 1.6*  --  1.8 1.7  --   GFRNONAA >60   < > >60 >60 >60  ANIONGAP 4*   < > 6 5 7    < > = values in this interval not displayed.    Lipids No results for input(s): CHOL, TRIG, HDL, LABVLDL, LDLCALC, CHOLHDL in the last  168 hours.  Hematology Recent Labs  Lab 06/30/21 0340 07/01/21 0414 07/02/21 0422  WBC 10.1 10.4 11.5*  RBC 2.93* 3.33* 3.20*  HGB 8.5* 9.8* 9.5*  HCT 28.2* 32.3* 31.1*  MCV 96.2 97.0 97.2  MCH 29.0 29.4 29.7  MCHC 30.1 30.3 30.5  RDW 18.4* 18.5* 18.8*  PLT 352 373 351   Thyroid No results for input(s): TSH, FREET4 in the last 168 hours.  BNP Recent Labs  Lab 06/28/21 1236  BNP >4,500.0*    DDimer No results for input(s): DDIMER in the last 168 hours.   Radiology    No results found.  Cardiac Studies   Echocardiogram 06/15/2021-EF 30 to 35% moderate to severe mitral GERD rotation no evidence of aortic valve stenosis.  Patient Profile     56 y.o. male with acute systolic heart failure atrial fibrillation/flutter with rapid ventricular response polysubstance use cocaine, smoking with MSSA bacteremia followed by infectious disease with recent cervical spine surgery debridement  Assessment & Plan    Acute systolic heart failure EF 35% - Doing very well with IV diuresis.  Awaiting creatinine value for today.  Has been  stable.  He is out 13.6 L since admission.  Continue with spironolactone 25 mg.  Also on metoprolol.  Atrial fibrillation with rapid ventricular response - Converted to sinus rhythm in the 80s. -Continue with metoprolol 50 twice daily -Not a good long-term anticoagulation candidate. -CHADSVASc officially 1 for CHF.  MSSA bacteremia - Infectious disease following.  Unfortunately we have been asked to perform TEE on him a few times but given his cervical spine/recent cervical spine surgery as well as documentation of retropharyngeal abscess, he is not a good candidate at this point.  I would continue to treat with IV antibiotics regardless as directed by infectious disease.  Social determinants of health - Has had many AMA's discharges recently.  Continue to encourage cocaine cessation.   For questions or updates, please contact [facility] Please consult [email or web address] for contact info under        Signed, Donato Schultz, MD  07/02/2021, 9:10 AM

## 2021-07-03 DIAGNOSIS — I480 Paroxysmal atrial fibrillation: Secondary | ICD-10-CM | POA: Diagnosis not present

## 2021-07-03 DIAGNOSIS — B9561 Methicillin susceptible Staphylococcus aureus infection as the cause of diseases classified elsewhere: Secondary | ICD-10-CM | POA: Diagnosis not present

## 2021-07-03 DIAGNOSIS — I5021 Acute systolic (congestive) heart failure: Secondary | ICD-10-CM | POA: Diagnosis not present

## 2021-07-03 DIAGNOSIS — R7881 Bacteremia: Secondary | ICD-10-CM | POA: Diagnosis not present

## 2021-07-03 LAB — BASIC METABOLIC PANEL
Anion gap: 5 (ref 5–15)
BUN: 14 mg/dL (ref 6–20)
CO2: 28 mmol/L (ref 22–32)
Calcium: 8.5 mg/dL — ABNORMAL LOW (ref 8.9–10.3)
Chloride: 104 mmol/L (ref 98–111)
Creatinine, Ser: 0.52 mg/dL — ABNORMAL LOW (ref 0.61–1.24)
GFR, Estimated: >60 mL/min (ref >60)
Glucose, Bld: 101 mg/dL — ABNORMAL HIGH (ref 70–99)
Potassium: 4.1 mmol/L (ref 3.5–5.1)
Sodium: 137 mmol/L (ref 135–145)

## 2021-07-03 LAB — MAGNESIUM: Magnesium: 1.6 mg/dL — ABNORMAL LOW (ref 1.7–2.4)

## 2021-07-03 MED ORDER — LOSARTAN POTASSIUM 25 MG PO TABS
25.0000 mg | ORAL_TABLET | Freq: Every day | ORAL | Status: DC
  Administered 2021-07-03 – 2021-07-05 (×3): 25 mg via ORAL
  Filled 2021-07-03 (×3): qty 1

## 2021-07-03 MED ORDER — METOPROLOL SUCCINATE ER 100 MG PO TB24
100.0000 mg | ORAL_TABLET | Freq: Every day | ORAL | Status: DC
  Administered 2021-07-04 – 2021-07-05 (×2): 100 mg via ORAL
  Filled 2021-07-03 (×2): qty 1

## 2021-07-03 MED ORDER — MAGNESIUM SULFATE 2 GM/50ML IV SOLN
2.0000 g | Freq: Once | INTRAVENOUS | Status: AC
  Administered 2021-07-03: 2 g via INTRAVENOUS
  Filled 2021-07-03: qty 50

## 2021-07-03 MED ORDER — FUROSEMIDE 40 MG PO TABS
40.0000 mg | ORAL_TABLET | Freq: Every day | ORAL | Status: DC
  Administered 2021-07-04 – 2021-07-05 (×2): 40 mg via ORAL
  Filled 2021-07-03 (×2): qty 1

## 2021-07-03 NOTE — Progress Notes (Signed)
Progress Note  Patient Name: Logan Nelson Date of Encounter: 07/03/2021  [facility] Cardiologist: Orbie Pyo, MD   Subjective   Understands he has a bed at a SNF, but wants to go today or otherwise he is thinking about leaving AMA. Encouraged him to stay until he can be transferred to make sure he has the best chance at recovery.  Inpatient Medications    Scheduled Meds:  (feeding supplement) PROSource Plus  30 mL Oral BID BM   Chlorhexidine Gluconate Cloth  6 each Topical Daily   feeding supplement  1 Container Oral TID BM   [START ON 07/04/2021] furosemide  40 mg Oral Daily   heparin injection (subcutaneous)  5,000 Units Subcutaneous Q8H   lidocaine  1 patch Transdermal Q24H   losartan  25 mg Oral Daily   [START ON 07/04/2021] metoprolol succinate  100 mg Oral Daily   metoprolol tartrate  50 mg Oral BID   multivitamin with minerals  1 tablet Oral Daily   potassium chloride  40 mEq Oral BID   senna-docusate  2 tablet Oral BID   sodium chloride flush  10-40 mL Intracatheter Q12H   spironolactone  25 mg Oral Daily   Continuous Infusions:  sodium chloride 10 mL/hr at 06/28/21 1830    ceFAZolin (ANCEF) IV 2 g (07/03/21 1151)   magnesium sulfate bolus IVPB     PRN Meds: sodium chloride, acetaminophen **OR** acetaminophen, bisacodyl, hydrALAZINE, magnesium hydroxide, menthol-cetylpyridinium **OR** phenol, morphine injection, oxyCODONE, polyethylene glycol, sodium chloride flush   Vital Signs    Vitals:   07/03/21 0039 07/03/21 0554 07/03/21 0557 07/03/21 0736  BP: 132/90 (!) 139/96  (!) 159/91  Pulse:    77  Resp: 17 13  19   Temp: 98.6 F (37 C) 98.8 F (37.1 C)  97.7 F (36.5 C)  TempSrc:  Oral  Oral  SpO2: 100% 100%  100%  Weight:   64.4 kg   Height:        Intake/Output Summary (Last 24 hours) at 07/03/2021 1216 Last data filed at 07/03/2021 1119 Gross per 24 hour  Intake 240 ml  Output 4200 ml  Net -3960 ml      07/03/2021    5:57 AM  07/02/2021    4:00 AM 07/01/2021    1:41 AM  Last 3 Weights  Weight (lbs) 141 lb 15.6 oz 144 lb 4.8 oz 145 lb 12.8 oz  Weight (kg) 64.4 kg 65.454 kg 66.134 kg      Telemetry    NSR - Personally Reviewed  ECG    No new since 5/23 - Personally Reviewed  Physical Exam   GEN: No acute distress.   Neck: No JVD Cardiac: RRR, no murmurs, rubs, or gallops.  Respiratory: Clear to auscultation bilaterally. GI: Soft, nontender, non-distended  MS: No edema; No deformity. Neuro:  Nonfocal  Psych: Normal affect   Labs    High Sensitivity Troponin:   Recent Labs  Lab 06/28/21 1034 06/28/21 1236  TROPONINIHS 57* 55*     Chemistry Recent Labs  Lab 06/29/21 0152 06/30/21 0340 07/01/21 0414 07/03/21 0458  NA 139 138 138 137  K 4.5 4.0 4.1 4.1  CL 108 107 105 104  CO2 25 26 26 28   GLUCOSE 103* 92 79 101*  BUN 13 10 14 14   CREATININE 0.53* 0.48* 0.50* 0.52*  CALCIUM 8.7* 8.3* 8.6* 8.5*  MG 1.8 1.7  --  1.6*  GFRNONAA >60 >60 >60 >60  ANIONGAP 6 5  7 5    Lipids No results for input(s): CHOL, TRIG, HDL, LABVLDL, LDLCALC, CHOLHDL in the last 168 hours.  Hematology Recent Labs  Lab 06/30/21 0340 07/01/21 0414 07/02/21 0422  WBC 10.1 10.4 11.5*  RBC 2.93* 3.33* 3.20*  HGB 8.5* 9.8* 9.5*  HCT 28.2* 32.3* 31.1*  MCV 96.2 97.0 97.2  MCH 29.0 29.4 29.7  MCHC 30.1 30.3 30.5  RDW 18.4* 18.5* 18.8*  PLT 352 373 351   Thyroid No results for input(s): TSH, FREET4 in the last 168 hours.  BNP Recent Labs  Lab 06/28/21 1236  BNP >4,500.0*    DDimer No results for input(s): DDIMER in the last 168 hours.   Radiology    No results found.  Cardiac Studies   Echo 06/15/21 1. There is a mobile filamentous structure in the RA. 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 No valvular lesions are present to suggest endocarditis.  Of note, this structure was present on the prior echo from March 2023.   2. LV myocardium is hyper trabeculated. Consider contrast echo or MRI to  exclude  noncompaction. Left ventricular ejection fraction, by estimation,  is 30 to 35%. The left ventricle has moderately decreased function. The  left ventricle demonstrates global   hypokinesis. Indeterminate diastolic filling due to E-A fusion.   3. Right ventricular systolic function is low normal. The right  ventricular size is normal. There is normal pulmonary artery systolic  pressure. The estimated right ventricular systolic pressure is 35.9 mmHg.   4. Left atrial size was severely dilated.   5. Mild to moderate mitral regurgitation is present on this study. The  prior study demonstrated possible moderate to severe MR. Would recommend  TEE for clarification. The mitral valve is grossly normal. Mild to  moderate mitral valve regurgitation. No  evidence of mitral stenosis.   6. The aortic valve is tricuspid. There is mild calcification of the  aortic valve. Aortic valve regurgitation is not visualized. Aortic valve  sclerosis is present, with no evidence of aortic valve stenosis.   7. The inferior vena cava is normal in size with greater than 50%  respiratory variability, suggesting right atrial pressure of 3 mmHg.   Comparison(s): Changes from prior study are noted. EF now 30-35%. MR is  now mild to moderate. 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   Patient Profile     56 y.o. male with acute systolic heart failure atrial fibrillation/flutter with rapid ventricular response polysubstance use cocaine, smoking with MSSA bacteremia followed by infectious disease with recent cervical spine surgery debridement  Assessment & Plan    Acute systolic and diastolic heart failure -EF 35% -Admission weight 67.3 kg, current weight 64.4 kg, net negative ~17 L (does not agree with weights) -currently on furosemide 40 mg IV BID. Appears euvolemic, will transition to oral to start tomorrow -Albumin on admission 2.3, likely that hypoalbuminemia also  contributing to edema -Continue spironolactone 25 mg daily, losartan 25 mg daily, metoprolol (will consolidate to succinate today) -renal function stable. Cr 0.52, K 4.1 -with bacteremia, limited mobility, felt to be high risk for UTI so SGLT2i not started  Paroxysmal atrial fibrillation with RVR -brief, spontaneously converted -continue metoprolol CHA2DS2/VAS Stroke Risk Points=1 -see notes from Dr. Anne Fu, not felt to be good long term anticoagulant candidate. 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   Medication Recommendations:   Furosemide 40 mg daily Losartan 25 mg daily Metoprolol succinate 100 mg daily Spironolactone 25 mg daily Other recommendations (labs, testing, etc):  none Follow up as an outpatient:  Has a follow up at the HF impact clinic scheduled 07/13/21  For questions or updates, please contact [facility] Please consult [email or web address] for contact info under     Signed, Jodelle RedBridgette Delayni Streed, MD  07/03/2021, 12:16 PM

## 2021-07-03 NOTE — Progress Notes (Addendum)
PROGRESS NOTE    Logan Nelson  [id number] DOB: 02-03-1966 DOA: 06/16/2021 PCP: [name], MD  56 y.o. male with past medical history of severe protein calorie malnutrition, hypertension, anasarca, abdominal wall hernia presented to the hospital with multiple falls and swelling. -Recently hospitalized for incarcerated abdominal hernia, had primary repair but left AMA before he could be placed in rehab -Admitted 4/3 with severe debility, protein calorie malnutrition and failure to thrive [facility] course complicated by sepsis, MSSA bacteremia - ID following.  Will be undergoing MRI of the spine and sacrum to rule out osteomyelitis.2D Echo also showed worsening EF negative for vegetation -On 5/10 he left AMA, back in the ER same night -MRI thoracic lumbar and sacral spine noted concern for coccygeal osteomyelitis and cervical spine epidural abscess without cord compression, seen by neurosurgery, underwent anterior C4/C5 decompression, arthrodesis C4-C5 with 7 mm structural allograft and anterior instrumentation, TEE was deferred acutely in the setting of recent C-spine surgery -5/24 AM developed transient rapid A-fib -Volume overload improving on diuretics  Subjective: -Feels okay, no events overnight, breathing is improving, swelling in his right arm continues to improve,  wants to go to rehab  Assessment and Plan:  MSSA bacteremia  Cervical spine epidural abscess Coccygeal osteomyelitis -Neurosurgery consulted underwent anterior cervical decompression, abscess evacuation and instrumentation 5/17, OR cultures grew MSSA -Remains on IV cefazolin to complete 6-week course on 6/28 -Seen by cardiology, felt to be high risk for TEE at this time, will require long-term antibiotic course regardless -Repeat blood cultures are negative -PICC line placed 5/25  Infected sacral decubitus ulcer -Completed 10 days of IV Unasyn, currently on IV Ancef for MSSA bacteremia -Continue local  wound care  Right forearm swelling and report of right shoulder pain -CT right shoulder showed severe glenohumeral osteoarthritis with small effusion -Duplex negative for DVT -Patient denies any pain or discomfort in the shoulder any longer, has full range of motion, monitor clinically  Acute systolic congestive heart failure: Echocardiogram 3/23 with ejection fraction 60 to 65% Repeat echocardiogram with 30 to 35%. -Improving on diuretics, he is 17.8 L negative, creatinine is stable -Started on Aldactone, add low-dose losartan -Transition to oral diuretics in 1 to 2 days -Not a good candidate for SGLT2 he is high risk of UTI in the setting of above issues -Per cards not appropriate for TEE in the setting of recent C spine surgery -Will need cardiology follow-up  Transient A-fib RVR, 5/24 early a.m. -Back in sinus rhythm, continue metoprolol -Appreciate cardiology input, called and discussed with neurosurgery Dr. Christella Noa 5/25, he is okay with systemic anticoagulation at this time -Discussed with cards, not a good long-term anticoagulation candidate, fortunately remains in sinus rhythm   Severe protein calorie malnutrition: Continue supplements  Severe debility, frailty: Continue PT OT, SNF recommended TOC consulting,.  Patient will need prolonged antibiotics.    Essential hypertension: Blood pressure are stable.   Anemia of chronic disease: 3 PRBC transfusion during previous hospitalization.  No active bleeding.  -Stable, monitor   Hypomagnesemia: Replaced and adequate.      DVT prophylaxis: Heparin SQ   Code Status: Full code Family Communication: None. 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 Subcutaneous fat may be visible but bone, tendon or muscle are NOT exposed. 2 in vertical by .5 in horizontal--approximately--wound tunneling and pink (Active)  06/17/21 1159  Location: Sacrum  Location  Orientation:   Staging: Stage  3 -  Full thickness tissue loss. Subcutaneous fat may be visible but bone, tendon or muscle are NOT exposed.  Wound Description (Comments): 2 in vertical by .5 in horizontal--approximately--wound tunneling and pink  Present on Admission:       Consultants:  Infectious disease Neurosurgery ENT,   Procedures: 5/17 PROCEDURE:  Anterior Cervical decompression C4/5 Arthrodesis C4-5 with 38mm structural allograft Anterior instrumentation(Nuvasive) C4-5   SURGEON:   Surgeon(s): Ashok Pall, MD Eustace Moore, MD   Objective: Vitals:   07/03/21 0039 07/03/21 0554 07/03/21 0557 07/03/21 0736  BP: 132/90 (!) 139/96  (!) 159/91  Pulse:    77  Resp: 17 13  19   Temp: 98.6 F (37 C) 98.8 F (37.1 C)  97.7 F (36.5 C)  TempSrc:  Oral  Oral  SpO2: 100% 100%  100%  Weight:   64.4 kg   Height:        Intake/Output Summary (Last 24 hours) at 07/03/2021 [id number] Last data filed at 07/03/2021 0557 Gross per 24 hour  Intake --  Output 3600 ml  Net -3600 ml   Filed Weights   07/01/21 0141 07/02/21 0400 07/03/21 0557  Weight: 66.1 kg 65.5 kg 64.4 kg    Examination:  General exam: Chronically ill male sitting up in bed, AAOx3, no distress HEENT: Positive JVD CVS: S1-S2, regular rhythm Lungs: Diminished breath sounds to bases otherwise clear Abdomen: Soft, nontender, bowel sounds present Extremities: Scant right arm swelling, right arm PICC line Psychiatry:  Mood & affect appropriate.     Data Reviewed:   CBC: Recent Labs  Lab 06/27/21 0055 06/29/21 0152 06/30/21 0340 07/01/21 0414 07/02/21 0422  WBC 9.7 13.5* 10.1 10.4 11.5*  NEUTROABS 6.6 9.4* 5.5  --   --   HGB 8.8* 9.6* 8.5* 9.8* 9.5*  HCT 28.8* 31.7* 28.2* 32.3* 31.1*  MCV 95.4 94.3 96.2 97.0 97.2  PLT 373 423* 352 373 [id number]   Basic Metabolic Panel: Recent Labs  Lab 06/27/21 0055 06/28/21 1034 06/29/21 0152 06/30/21 0340 07/01/21 0414 07/03/21 0458  NA 141 138 139 138 138 137   K 3.3* 4.0 4.5 4.0 4.1 4.1  CL 110 106 108 107 105 104  CO2 27 26 25 26 26 28   GLUCOSE 119* 120* 103* 92 79 101*  BUN 10 11 13 10 14 14   CREATININE 0.40* 0.45* 0.53* 0.48* 0.50* 0.52*  CALCIUM 8.2* 8.9 8.7* 8.3* 8.6* 8.5*  MG 1.6*  --  1.8 1.7  --  1.6*  PHOS  --   --  3.0 2.6  --   --    GFR: Estimated Creatinine Clearance: 87.6 mL/min (A) (by C-G formula based on SCr of 0.52 mg/dL (L)). Liver Function Tests: No results for input(s): AST, ALT, ALKPHOS, BILITOT, PROT, ALBUMIN in the last 168 hours. No results for input(s): LIPASE, AMYLASE in the last 168 hours. No results for input(s): AMMONIA in the last 168 hours. Coagulation Profile: No results for input(s): INR, PROTIME in the last 168 hours. Cardiac Enzymes: No results for input(s): CKTOTAL, CKMB, CKMBINDEX, TROPONINI in the last 168 hours. BNP (last 3 results) No results for input(s): PROBNP in the last 8760 hours. HbA1C: No results for input(s): HGBA1C in the last 72 hours. CBG: No results for input(s): GLUCAP in the last 168 hours. Lipid Profile: No results for input(s): CHOL, HDL, LDLCALC, TRIG, CHOLHDL, LDLDIRECT in the last 72 hours. Thyroid Function Tests: No results for input(s): TSH, T4TOTAL, FREET4, T3FREE, THYROIDAB in the last 72 hours.  Anemia Panel: No results for input(s): VITAMINB12, FOLATE, FERRITIN, TIBC, IRON, RETICCTPCT in the last 72 hours. Urine analysis:    Component Value Date/Time   COLORURINE AMBER (A) 06/17/2021 1007   APPEARANCEUR HAZY (A) 06/17/2021 1007   LABSPEC 1.028 06/17/2021 1007   PHURINE 5.0 06/17/2021 1007   GLUCOSEU NEGATIVE 06/17/2021 1007   HGBUR LARGE (A) 06/17/2021 1007   BILIRUBINUR NEGATIVE 06/17/2021 1007   KETONESUR NEGATIVE 06/17/2021 1007   PROTEINUR NEGATIVE 06/17/2021 1007   NITRITE NEGATIVE 06/17/2021 1007   LEUKOCYTESUR NEGATIVE 06/17/2021 1007   Sepsis Labs: @LABRCNTIP (procalcitonin:4,lacticidven:4)  ) Recent Results (from the past 240 hour(s))  Surgical  pcr screen     Status: None   Collection Time: 06/23/21 10:50 AM   Specimen: Nasal Mucosa; Nasal Swab  Result Value Ref Range Status   MRSA, PCR NEGATIVE NEGATIVE Final   Staphylococcus aureus NEGATIVE NEGATIVE Final    Comment: (NOTE) The Xpert SA Assay (FDA approved for NASAL specimens in patients 109 years of age and older), is one component of a comprehensive surveillance program. It is not intended to diagnose infection nor to guide or monitor treatment. Performed at [facility], [place] [address] [place], [place] [address]   Aerobic/Anaerobic Culture w Gram Stain (surgical/deep wound)     Status: None   Collection Time: 06/23/21  1:49 PM   Specimen: Soft Tissue, Other  Result Value Ref Range Status   Specimen Description TISSUE  Final   Special Requests ANTERIOR CERVICAL AND EPIDURAL ABSCESS  Final   Gram Stain NO WBC SEEN NO ORGANISMS SEEN   Final   Culture   Final    FEW STAPHYLOCOCCUS AUREUS CRITICAL RESULT CALLED TO, READ BACK BY AND VERIFIED WITH: RN ASHLEY.S AT 1139 ON 06/24/2021 BY T.SAAD. NO ANAEROBES ISOLATED Performed at [facility], [place] [address], [place], [place] [address]    Report Status 06/28/2021 FINAL  Final   Organism ID, Bacteria STAPHYLOCOCCUS AUREUS  Final      Susceptibility   Staphylococcus aureus - MIC*    CIPROFLOXACIN <=0.5 SENSITIVE Sensitive     ERYTHROMYCIN >=8 RESISTANT Resistant     GENTAMICIN <=0.5 SENSITIVE Sensitive     OXACILLIN <=0.25 SENSITIVE Sensitive     TETRACYCLINE <=1 SENSITIVE Sensitive     VANCOMYCIN 1 SENSITIVE Sensitive     TRIMETH/SULFA <=10 SENSITIVE Sensitive     CLINDAMYCIN RESISTANT Resistant     RIFAMPIN <=0.5 SENSITIVE Sensitive     Inducible Clindamycin POSITIVE Resistant     * FEW STAPHYLOCOCCUS AUREUS  Aerobic/Anaerobic Culture w Gram Stain (surgical/deep wound)     Status: None   Collection Time: 06/23/21  2:32 PM   Specimen: PATH Other; Tissue  Result Value Ref Range Status   Specimen  Description TISSUE  Final   Special Requests NONE  Final   Gram Stain   Final    RARE WBC PRESENT, PREDOMINANTLY MONONUCLEAR RARE GRAM POSITIVE COCCI Performed at [facility], [address] [place], [place] [address]    Culture   Final    FEW STAPHYLOCOCCUS AUREUS SUSCEPTIBILITIES PERFORMED ON PREVIOUS CULTURE WITHIN THE LAST 5 DAYS. NO ANAEROBES ISOLATED; CULTURE IN PROGRESS FOR 5 DAYS    Report Status 06/28/2021 FINAL  Final     Radiology Studies: No results found.   Scheduled Meds:  (feeding supplement) PROSource Plus  30 mL Oral BID BM   Chlorhexidine Gluconate Cloth  6 each Topical Daily   feeding supplement  1 Container  Oral TID BM   furosemide  40 mg Intravenous BID   heparin injection (subcutaneous)  5,000 Units Subcutaneous Q8H   lidocaine  1 patch Transdermal Q24H   metoprolol tartrate  50 mg Oral BID   multivitamin with minerals  1 tablet Oral Daily   potassium chloride  40 mEq Oral BID   senna-docusate  2 tablet Oral BID   sodium chloride flush  10-40 mL Intracatheter Q12H   spironolactone  25 mg Oral Daily   Continuous Infusions:  sodium chloride 10 mL/hr at 06/28/21 1830    ceFAZolin (ANCEF) IV 2 g (07/03/21 0143)     LOS: 16 days    Time spent: 16min  Domenic Polite, MD [facility]   07/03/2021, 8:12 AM

## 2021-07-04 DIAGNOSIS — B9561 Methicillin susceptible Staphylococcus aureus infection as the cause of diseases classified elsewhere: Secondary | ICD-10-CM | POA: Diagnosis not present

## 2021-07-04 DIAGNOSIS — R7881 Bacteremia: Secondary | ICD-10-CM | POA: Diagnosis not present

## 2021-07-04 LAB — BASIC METABOLIC PANEL
Anion gap: 6 (ref 5–15)
BUN: 15 mg/dL (ref 6–20)
CO2: 27 mmol/L (ref 22–32)
Calcium: 8.9 mg/dL (ref 8.9–10.3)
Chloride: 105 mmol/L (ref 98–111)
Creatinine, Ser: 0.44 mg/dL — ABNORMAL LOW (ref 0.61–1.24)
GFR, Estimated: >60 mL/min (ref >60)
Glucose, Bld: 84 mg/dL (ref 70–99)
Potassium: 4.6 mmol/L (ref 3.5–5.1)
Sodium: 138 mmol/L (ref 135–145)

## 2021-07-04 MED ORDER — LOSARTAN POTASSIUM 25 MG PO TABS: 25.0000 mg | ORAL_TABLET | Freq: Every day | ORAL | Status: DC

## 2021-07-04 MED ORDER — POTASSIUM CHLORIDE CRYS ER 20 MEQ PO TBCR
20.0000 meq | EXTENDED_RELEASE_TABLET | Freq: Every day | ORAL | Status: DC
Start: 2021-07-04 — End: 2022-04-05

## 2021-07-04 MED ORDER — CEFAZOLIN IV (FOR PTA / DISCHARGE USE ONLY): 2.0000 g | Freq: Three times a day (TID) | INTRAVENOUS | 0 refills | Status: AC

## 2021-07-04 MED ORDER — FUROSEMIDE 40 MG PO TABS
40.0000 mg | ORAL_TABLET | Freq: Every day | ORAL | 0 refills | Status: DC
Start: 2021-07-04 — End: 2021-08-25

## 2021-07-04 MED ORDER — OXYCODONE HCL 5 MG PO TABS: 5.0000 mg | ORAL_TABLET | Freq: Four times a day (QID) | ORAL | 0 refills | Status: DC | PRN

## 2021-07-04 MED ORDER — SENNOSIDES-DOCUSATE SODIUM 8.6-50 MG PO TABS: 1.0000 | ORAL_TABLET | Freq: Two times a day (BID) | ORAL | Status: DC

## 2021-07-04 MED ORDER — METOPROLOL SUCCINATE ER 100 MG PO TB24: 100.0000 mg | ORAL_TABLET | Freq: Every day | ORAL | Status: DC

## 2021-07-04 MED ORDER — SPIRONOLACTONE 25 MG PO TABS: 25.0000 mg | ORAL_TABLET | Freq: Every day | ORAL | Status: DC

## 2021-07-04 MED ORDER — ACETAMINOPHEN 325 MG PO TABS
650.0000 mg | ORAL_TABLET | Freq: Four times a day (QID) | ORAL | Status: DC | PRN
Start: 2021-07-04 — End: 2022-04-05

## 2021-07-04 NOTE — TOC Progression Note (Signed)
Transition of Care [facility]) - Progression Note    Patient Details  Name: Logan Nelson MRN: [id number] Date of Birth: [birth year]  Transition of [facility] CM/SW [address], LCSW Phone Number: 07/04/2021, 9:24 AM  Clinical Narrative:     CSW spoke with Loma Boston from [facility] about pt admitting today. Teena explained that facility can accept tomorrow.  TOC team will continue to assist with discharge planning needs.       Expected Discharge Plan and Services                                                 Social Determinants of Health (SDOH) Interventions Food Insecurity Interventions: Intervention Not Indicated Housing Interventions: Intervention Not Indicated Transportation Interventions: Intervention Not Indicated  Readmission Risk Interventions    07/02/2021    2:35 PM 06/08/2021    1:48 PM 05/05/2021   10:01 AM  Readmission Risk Prevention Plan  Transportation Screening  Complete Complete  Medication Review (RN Care Manager)  Referral to Pharmacy Referral to Pharmacy  PCP or Specialist appointment within 3-5 days of discharge  Complete Complete  HRI or Home Care Consult  Patient refused Patient refused  SW Recovery Care/Counseling Consult  Complete Complete  Palliative Care Screening  Not Applicable Not Applicable  Skilled Nursing Facility Complete Not Applicable Complete

## 2021-07-04 NOTE — Discharge Summary (Addendum)
Physician Discharge Summary  Logan Nelson HRC:[id number] DOB: Mar 28, 1965 DOA: 06/16/2021  PCP: [name], MD  Admit date: 06/16/2021 Discharge date: 07/05/2021  Time spent: 45 minutes  Recommendations for Outpatient Follow-up:  SNF for short-term rehab Continue IV Ancef until 6/28 to complete 6-week course, will need PICC line removed after completion of antibiotic course CHF TOC clinic on 6/6   Discharge Diagnoses:  Principal Problem:   MSSA bacteremia C-spine epidural abscess Anasarca Hypoalbuminemia   Acute systolic (congestive) heart failure (HCC)   Prolonged QT interval   Protein-calorie malnutrition, severe   Generalized weakness   Pressure injury of buttock, stage 2 (HCC)   Hypertension   Anemia of chronic disease   Epidural abscess   Abscess in epidural space of cervical spine   Discharge Condition: Stable  Diet recommendation: Low-sodium  Filed Weights   07/03/21 0557 07/04/21 0601 07/05/21 0202  Weight: 64.4 kg 65.3 kg 65.3 kg    History of present illness:  56 y.o. male with past medical history of severe protein calorie malnutrition, hypertension, anasarca, abdominal wall hernia presented to the hospital with multiple falls and swelling. -Recently hospitalized for incarcerated abdominal hernia, had primary repair but left AMA before he could be placed in rehab -Admitted 4/3 with severe debility, protein calorie malnutrition and failure to thrive [facility] course complicated by sepsis, MSSA bacteremia - ID following.  Will be undergoing MRI of the spine and sacrum to rule out osteomyelitis.2D Echo also showed worsening EF negative for vegetation -On 5/10 he left AMA, back in the ER same night -MRI thoracic lumbar and sacral spine noted concern for coccygeal osteomyelitis and cervical spine epidural abscess without cord compression, seen by neurosurgery, underwent anterior C4/C5 decompression, arthrodesis C4-C5 with 7 mm structural allograft and  anterior instrumentation, TEE was deferred acutely in the setting of recent C-spine surgery -5/24 AM developed transient rapid A-fib -Volume overload improving on diuretics    Hospital Course:   MSSA bacteremia  Cervical spine epidural abscess Coccygeal osteomyelitis -Neurosurgery consulted underwent anterior cervical decompression, abscess evacuation and instrumentation 5/17, OR cultures grew MSSA -Remains on IV cefazolin to complete 6-week course on 6/28 -Seen by cardiology, felt to be high risk for TEE at this time, will require long-term antibiotic course regardless -Repeat blood cultures are negative -PICC line placed 5/25 -Follow-up with infectious disease   Infected sacral decubitus ulcer -Completed 10 days of IV Unasyn, currently on IV Ancef for MSSA bacteremia -Continue local wound care   Right forearm swelling and report of right shoulder pain -CT right shoulder showed severe glenohumeral osteoarthritis with small effusion -Duplex negative for DVT -Any now with hardly any shoulder symptoms   Acute systolic congestive heart failure: Echocardiogram 3/23 with ejection fraction 60 to 65% Repeat echocardiogram with 30 to 35%, seen by cardiology this admission, not felt to be appropriate for ischemic work-up now in the setting of bacteremia, osteomyelitis abscess etc. -Improving on diuretics, he is 19 L negative, creatinine is stable -Started on Aldactone, losartan, transitioned to oral diuretics yesterday -Not a good candidate for SGLT2 he is high risk of UTI in the setting of above issues -CHF ToC follow up arranged   Transient A-fib RVR, 5/24 early a.m. -Back in sinus rhythm, continue metoprolol -Appreciate cardiology input, called and discussed with neurosurgery Dr. Christella Noa 5/25, he is okay with systemic anticoagulation at this time -Discussed with cards, not felt to be a good long-term anticoagulation candidate, fortunately remains in sinus rhythm   Severe protein  calorie malnutrition: Continue  supplements  Severe debility, frailty: Continue PT OT, SNF recommended TOC consulting,.  Patient will need prolonged antibiotics.     Essential hypertension: Blood pressure are stable.   Anemia of chronic disease: 3 PRBC transfusion during previous hospitalization.  No active bleeding.  -Stable, monitor   Hypomagnesemia: Replaced    Pressure wound  Pressure Injury 06/17/21 Sacrum Stage 3 -  Full thickness tissue loss. Subcutaneous fat may be visible but bone, tendon or muscle are NOT exposed. 2 in vertical by .5 in horizontal--approximately--wound tunneling and pink (Active)  06/17/21 1159  Location: Sacrum  Location Orientation:   Staging: Stage 3 -  Full thickness tissue loss. Subcutaneous fat may be visible but bone, tendon or muscle are NOT exposed.  Wound Description (Comments): 2 in vertical by .5 in horizontal--approximately--wound tunneling and pink  Present on Admission:       Consultants:  Infectious disease Neurosurgery ENT,   Procedures: 5/17 PROCEDURE:  Anterior Cervical decompression C4/5 Arthrodesis C4-5 with 86m structural allograft Anterior instrumentation(Nuvasive) C4-5   SURGEON:   Surgeon(s): CAshok Pall MD JEustace Moore MD   Discharge Exam: Vitals:   07/05/21 0513 07/05/21 0720  BP: (!) 123/93 120/84  Pulse: 95 90  Resp: 18 17  Temp: 98.2 F (36.8 C) 98.5 F (36.9 C)  SpO2: 100% 100%   General exam: Chronically ill male sitting up in bed, AAOx3, no distress HEENT: Positive JVD CVS: S1-S2, regular rhythm Lungs: Diminished breath sounds to bases otherwise clear Abdomen: Soft, nontender, bowel sounds present Extremities: Scant right arm swelling, right arm PICC line Psychiatry:  Mood & affect appropriate.   Discharge Instructions   Discharge Instructions     Diet - low sodium heart healthy   Complete by: As directed    Discharge wound care:   Complete by: As directed    Apply a moistened saline gauze  into the sacral wound loosely (do not pack tight), then cover with dry 4 x 4 and secure with sacral foam dressing, tip up. Change daily or PRN soiling.   Home infusion instructions   Complete by: As directed    Instructions: Flushing of vascular access device: 0.9% NaCl pre/post medication administration and prn patency; Heparin 100 u/ml, 559mfor implanted ports and Heparin 10u/ml, 65m96mor all other central venous catheters.   Increase activity slowly   Complete by: As directed       Allergies as of 07/05/2021   No Known Allergies      Medication List     TAKE these medications    acetaminophen 325 MG tablet Commonly known as: TYLENOL Take 2 tablets (650 mg total) by mouth every 6 (six) hours as needed for mild pain ((score 1 to 3) or temp > 100.5).   ceFAZolin  IVPB Commonly known as: ANCEF Inject 2 g into the vein every 8 (eight) hours. Indication:  MSSA bacteremia/cervical epidural abscess First Dose: Yes Last Day of Therapy:  08/04/21 Labs - Once weekly:  CBC/D and BMP, Labs - Every other week:  ESR and CRP   furosemide 40 MG tablet Commonly known as: LASIX Take 1 tablet (40 mg total) by mouth daily.   losartan 25 MG tablet Commonly known as: COZAAR Take 1 tablet (25 mg total) by mouth daily.   metoprolol succinate 100 MG 24 hr tablet Commonly known as: TOPROL-XL Take 1 tablet (100 mg total) by mouth daily. Take with or immediately following a meal.   oxyCODONE 5 MG immediate release tablet Commonly known as:  Oxy IR/ROXICODONE Take 1 tablet (5 mg total) by mouth every 6 (six) hours as needed for moderate pain.   potassium chloride SA 20 MEQ tablet Commonly known as: KLOR-CON M Take 1 tablet (20 mEq total) by mouth daily.   senna-docusate 8.6-50 MG tablet Commonly known as: Senokot-S Take 1 tablet by mouth 2 (two) times daily.   spironolactone 25 MG tablet Commonly known as: ALDACTONE Take 1 tablet (25 mg total) by mouth daily.               Home  Infusion Instuctions  (From admission, onward)           Start     Ordered   07/04/21 0000  Home infusion instructions       Question:  Instructions  Answer:  Flushing of vascular access device: 0.9% NaCl pre/post medication administration and prn patency; Heparin 100 u/ml, 81m for implanted ports and Heparin 10u/ml, 575mfor all other central venous catheters.   07/04/21 0925              Discharge Care Instructions  (From admission, onward)           Start     Ordered   07/04/21 0000  Discharge wound care:       Comments: Apply a moistened saline gauze into the sacral wound loosely (do not pack tight), then cover with dry 4 x 4 and secure with sacral foam dressing, tip up. Change daily or PRN soiling.   07/04/21 0930           No Known Allergies  Follow-up Information     CaAshok PallMD Follow up in 3 month(s).   Specialty: Neurosurgery Contact information: [phone]         MaRosiland OzMD Follow up on 07/29/2021.   Specialty: Infectious Diseases Why: Appointment at 10:30am Contact information: [phone]         [facility]. Go in 11 day(s).   Specialty: Cardiology Why: Hospital follow up PLEASE bring current medication list to appointment FREE valet parking, Entrance [address], off of [address] [address]: [phone]               The results of significant diagnostics from this hospitalization (including imaging, microbiology, ancillary and laboratory) are listed below for reference.    Significant Diagnostic Studies: DG Chest 2 View  Result Date: 06/17/2021 CLINICAL DATA:  Fall with back pain EXAM: CHEST - 2 VIEW COMPARISON:  06/14/2021 FINDINGS: Cardiomegaly. Congested appearance of  vessels with possible small left pleural effusion. No pneumothorax. Improved retrocardiac aeration. IMPRESSION: CHF pattern. Improved retrocardiac aeration. Electronically Signed   By: JoJorje Guild.D.   On: 06/17/2021 05:19   DG Cervical Spine 2 or 3 views  Result Date: 06/23/2021 CLINICAL DATA:  Cervical decompression and epidural abscess evacuation. EXAM: CERVICAL SPINE - 2-3 VIEW COMPARISON:  MRI of Jun 20, 2021. FINDINGS: Four intraoperative cross-table lateral projections were obtained of the cervical spine. The first image demonstrates surgical probe at approximately the C5 level. The second image demonstrates surgical probe at the C3-4 level. The third image demonstrates surgical probe at the C4-5 level. The fourth image demonstrates the patient be status post surgical anterior fusion of C4-5. IMPRESSION: Surgical localization during surgical anterior fusion of C4-5. Electronically  Signed   By: Marijo Conception M.D.   On: 06/23/2021 15:41   DG Thoracic Spine 2 View  Result Date: 06/17/2021 CLINICAL DATA:  Back pain after fall EXAM: THORACIC SPINE 2 VIEWS COMPARISON:  Thoracic MRI 05/11/2021 FINDINGS: No acute fracture or traumatic malalignment seen along the thoracic spine. Lower thoracic disc space narrowing and far-lateral spurring with mild dextrocurvature. Generalized interstitial coarsening with bands of atelectasis at the right base. IMPRESSION: No acute finding in the thoracic spine. Electronically Signed   By: Jorje Guild M.D.   On: 06/17/2021 05:22   DG Lumbar Spine Complete  Result Date: 06/17/2021 CLINICAL DATA:  Back pain after fall EXAM: LUMBAR SPINE - COMPLETE 4+ VIEW COMPARISON:  06/01/2021 abdominal CT FINDINGS: No acute fracture or traumatic malalignment. Decreased sensitivity from overlapping bowel gas and de-mineralization. Lumbar spine degeneration especially affecting the collapsed L5-S1 disc space where there is ridging. Lower lumbar facet osteoarthritis with  spurring. IMPRESSION: Lumbar spine degeneration without acute finding. Electronically Signed   By: Jorje Guild M.D.   On: 06/17/2021 05:21   CT Angio Chest Pulmonary Embolism (PE) W or WO Contrast  Result Date: 06/28/2021 CLINICAL DATA:  Pulmonary embolism suspected with high probability. EXAM: CT ANGIOGRAPHY CHEST WITH CONTRAST TECHNIQUE: Multidetector CT imaging of the chest was performed using the standard protocol during bolus administration of intravenous contrast. Multiplanar CT image reconstructions and MIPs were obtained to evaluate the vascular anatomy. RADIATION DOSE REDUCTION: This exam was performed according to the departmental dose-optimization program which includes automated exposure control, adjustment of the mA and/or kV according to patient size and/or use of iterative reconstruction technique. CONTRAST:  93m OMNIPAQUE IOHEXOL 350 MG/ML SOLN COMPARISON:  05/10/2021 FINDINGS: Cardiovascular: Satisfactory opacification of the pulmonary arteries to the segmental level. No evidence of pulmonary embolism. Diffuse cardiac enlargement. No pericardial effusion. Mediastinum/Nodes: Esophagus is decompressed. No significant lymphadenopathy. Thyroid gland is unremarkable. Diffuse soft tissue edema in the chest wall and mediastinum. Lungs/Pleura: Small bilateral pleural effusions. Atelectasis in the lung bases. Diffuse patchy and confluent areas of airspace disease throughout both lungs with interstitial thickening. Changes have developed since the previous study and may represent edema or multifocal pneumonia. Diffuse peribronchial thickening. Upper Abdomen: Diffuse soft tissue edema suggesting anasarca. No focal acute abnormalities. Musculoskeletal: Degenerative changes in the spine and shoulders. Areas of bone sclerosis at the vertebral endplates likely representing degenerative change. Review of the MIP images confirms the above findings. IMPRESSION: 1. No evidence of significant pulmonary  embolus. 2. Diffuse cardiac enlargement. 3. Bilateral pleural effusions with basilar atelectasis. 4. Interval progression of diffuse patchy and confluent airspace and interstitial disease in both lungs most likely representing edema although multifocal pneumonia could also have this appearance. Electronically Signed   By: WLucienne CapersM.D.   On: 06/28/2021 17:47   CT CERVICAL SPINE WO CONTRAST  Result Date: 06/19/2021 CLINICAL DATA:  Cervical radiculopathy with infection suspected. Evaluate quality of vertebral body bone due to bony infection. EXAM: CT CERVICAL SPINE WITHOUT CONTRAST TECHNIQUE: Multidetector CT imaging of the cervical spine was performed without intravenous contrast. Multiplanar CT image reconstructions were also generated. RADIATION DOSE REDUCTION: This exam was performed according to the departmental dose-optimization program which includes automated exposure control, adjustment of the mA and/or kV according to patient size and/or use of iterative reconstruction technique. COMPARISON:  Cervical MRI from yesterday FINDINGS: Alignment: Reversal of cervical lordosis without listhesis. Skull base and vertebrae: Negative for acute fracture. Subtle endplate erosion at the infected C4-5 disc level, eccentric  towards the left uncovertebral joint. No fracture or bony destruction. Soft tissues and spinal canal: Paravertebral edema. Underestimated fluid collections compared to prior MRI. Disc levels: Disc space infection as noted above. Disc space narrowing with endplate degeneration at C5-6 and C6-7. Upper chest: Left pleural effusion. IMPRESSION: Discitis/osteomyelitis with abscess at C4-5 that is underestimated relative to prior MRI. No fracture or significant bony destruction. Electronically Signed   By: Jorje Guild M.D.   On: 06/19/2021 06:29   MR CERVICAL SPINE WO CONTRAST  Result Date: 06/18/2021 CLINICAL DATA:  Cervical osteomyelitis.  MSSA bacteremia. EXAM: MRI CERVICAL SPINE WITHOUT  CONTRAST TECHNIQUE: Multiplanar, multisequence MR imaging of the cervical spine was performed. No intravenous contrast was administered. COMPARISON:  Cervical spine MRI 05/11/2021 FINDINGS: Alignment: Straightening/mild reversal of the normal cervical lordosis. No significant listhesis. Vertebrae: Diffusely diminished bone marrow T1 signal intensity likely related to known anemia. No fracture. New midline ventral epidural fluid collection extending from the C4 superior to C5 inferior endplates and measuring 5 x 10 x 30 mm (AP x transverse x craniocaudal). This likely extends from the C4-5 disc space which is mildly narrowed and demonstrates areas of fluid signal, most notably anteriorly. Similar appearance of endplate changes at V4-9 and C6-7 compared to the prior MRI which may be degenerative. Cord: Moderate cord flattening at C4-5 by the epidural collection. No definite cord edema. Posterior Fossa, vertebral arteries, paraspinal tissues: New prevertebral fluid which measures up to 1.1 cm in AP thickness at the C2-3 level. Diffuse soft tissue edema throughout the neck likely reflecting anasarca. Chronic pontine infarct. Disc levels: Moderate to severe spinal stenosis at C4-5 due to the epidural collection. IMPRESSION: 1. New ventral epidural fluid collection at C4-5 compatible with an abscess and associated with discitis and a new prevertebral fluid collection. Result in moderate to severe spinal stenosis. No cord edema. 2. Anasarca. These results will be called to the ordering clinician or representative by the Radiologist Assistant, and communication documented in the PACS or [employer]. Electronically Signed   By: Logan Bores M.D.   On: 06/18/2021 20:02   MR THORACIC SPINE WO CONTRAST  Result Date: 06/18/2021 CLINICAL DATA:  Thoracic osteomyelitis. MSSA bacteremia. EXAM: MRI THORACIC SPINE WITHOUT CONTRAST TECHNIQUE: Multiplanar, multisequence MR imaging of the thoracic spine was performed. No  intravenous contrast was administered. COMPARISON:  Thoracic spine radiographs 06/17/2021. Thoracic spine MRI 05/11/2021. Chest CT 05/10/2021. FINDINGS: Alignment:  Mild scoliosis.  No listhesis. Vertebrae: Diffusely diminished bone marrow T1 signal intensity likely related to known anemia. Similar appearance scattered foci of T2 hyperintensity along multiple thoracic endplates compared to the prior MRI, likely degenerative. No definite findings of discitis or epidural abscess. Persistent right-sided facet edema at T4-5, favored to be degenerative given lack of gross interval osseous destruction, no significant joint fluid, and appearance on the prior CT. Cord:  Normal cord signal and morphology. Paraspinal and other soft tissues: Small bilateral pleural effusions. Diffuse body wall edema. Disc levels: Similar appearance of mild multilevel thoracic disc degeneration without compressive stenosis. IMPRESSION: 1. No definite evidence of thoracic spine infection. 2. Persistent right-sided facet edema at T4-5, favored to be degenerative. 3. Small bilateral pleural effusions and diffuse body wall edema/anasarca. Electronically Signed   By: Logan Bores M.D.   On: 06/18/2021 19:50   MR LUMBAR SPINE WO CONTRAST  Result Date: 06/18/2021 CLINICAL DATA:  Lumbar osteomyelitis.  MSSA bacteremia. EXAM: MRI LUMBAR SPINE WITHOUT CONTRAST TECHNIQUE: Multiplanar, multisequence MR imaging of the lumbar spine was performed. No  intravenous contrast was administered. COMPARISON:  Lumbar spine radiographs 06/17/2021. CT abdomen and pelvis 06/01/2021. FINDINGS: Image quality is degraded by anasarca and ascites. Segmentation: Standard. Alignment:  Normal. Vertebrae: Diffusely diminished bone marrow T1 signal intensity likely related to known anemia. Advanced disc space narrowing at L5-S1 with a small amount of fluid signal in the disc space where vacuum disc phenomenon was present on the prior CT, favored to be degenerative and without  gross interval endplate destruction to clearly indicate discitis/osteomyelitis. No fracture. Conus medullaris and cauda equina: Conus extends to the T12-L1 level. Conus and cauda equina appear normal. Paraspinal and other soft tissues: Anasarca. No drainable paraspinal fluid collection. Disc levels: L1-2: Minimal leftward disc bulging and mild facet arthrosis without significant stenosis. L2-3: Mild disc bulging and mild facet arthrosis without significant stenosis. L3-4: Mild left eccentric disc bulging and mild facet arthrosis without significant stenosis. L4-5: Mild disc desiccation and mild disc space narrowing. Disc bulging, a left foraminal disc protrusion, and mild facet arthrosis result in mild spinal stenosis, mild-to-moderate bilateral lateral recess stenosis, and moderate bilateral neural foraminal stenosis. L5-S1: Severe disc space narrowing. Disc bulging, endplate spurring, and moderate facet arthrosis result in moderate bilateral neural foraminal stenosis without significant spinal stenosis. IMPRESSION: 1. No definite lumbar spine infection. Disc and endplate changes at B0-J6 are favored to be degenerative rather than reflective of discitis. 2. Mild spinal stenosis at L4-5. 3. Moderate neural foraminal stenosis at L4-5 and L5-S1. Electronically Signed   By: Logan Bores M.D.   On: 06/18/2021 19:36   MR CERVICAL SPINE W WO CONTRAST  Result Date: 06/21/2021 CLINICAL DATA:  Initial evaluation for epidural abscess. EXAM: MRI CERVICAL SPINE WITHOUT AND WITH CONTRAST TECHNIQUE: Multiplanar and multiecho pulse sequences of the cervical spine, to include the craniocervical junction and cervicothoracic junction, were obtained without and with intravenous contrast. CONTRAST:  6.69m GADAVIST GADOBUTROL 1 MMOL/ML IV SOLN COMPARISON:  Recent MRI from 06/18/2021. FINDINGS: Alignment: Reversal of the normal cervical lordosis with apex at C5. No interval listhesis. Vertebrae: Stable vertebral body height without  acute or interval fracture. Diffusely decreased T1/T2 signal intensity throughout the visualized marrow, likely related to patient's history of anemia. Again seen is a ventral epidural fluid collection along the midline extending from C3-4 to C5-6. This is relatively similar in size measuring 5 x 12 x 30 mm (AP by transverse by craniocaudad). Again, this is likely emanating from the C4-5 disc space which is somewhat narrowed with internal fluid signal intensity. Reactive endplate changes about the C5-6 and C6-7 interspaces are similar to previous exams, and may be degenerative. Cord: Moderate cord flattening at the level of C4-5 with the thecal sac narrowed to approximately 6 mm in AP diameter, similar to previous. Cord flattening is slightly worse on the right (series 12, image 20). There is suspected subtle and/or early cord signal changes at this level, best seen on axial T2 weighted sequence (series 12, images 19, 20). Posterior Fossa, vertebral arteries, paraspinal tissues: Chronic pontine lacunar infarct noted. Craniocervical junction normal. Layering retropharyngeal effusion with diffuse anasarca throughout the external soft tissues. Normal flow voids seen within the vertebral arteries bilaterally. A left pleural effusion at the left lung apex is partially visualized. Disc levels: Moderate to severe spinal stenosis at C4-5 due to the epidural collection, relatively stable. Degenerative reactive endplate changes at CG8-3and C6-7 without significant stenosis. No other new or progressive finding. IMPRESSION: 1. No significant interval change in size of ventral epidural fluid collection extending from C3-4  to C5-6, most consistent with epidural abscess. Resultant moderate to severe spinal stenosis at C4-5 with moderate cord flattening, similar. Suspected subtle/early cord signal changes as above. 2. Diffuse anasarca with left pleural effusion, partially visualized. Electronically Signed   By: Jeannine Boga  M.D.   On: 06/21/2021 01:17   CT SHOULDER RIGHT WO CONTRAST  Result Date: 06/27/2021 CLINICAL DATA:  Septic arthritis suspected, shoulder, xray done EXAM: CT OF THE UPPER RIGHT EXTREMITY WITHOUT CONTRAST TECHNIQUE: Multidetector CT imaging of the upper right extremity was performed according to the standard protocol. RADIATION DOSE REDUCTION: This exam was performed according to the departmental dose-optimization program which includes automated exposure control, adjustment of the mA and/or kV according to patient size and/or use of iterative reconstruction technique. COMPARISON:  CT 05/10/2021 FINDINGS: Bones/Joint/Cartilage No acute fracture. No dislocation. Severe glenohumeral joint osteoarthritis manifested by joint space narrowing, subchondral sclerosis/cystic change, and marginal osteophyte formation. There are prominent subchondral cystic changes within the inferior glenoid and superior humeral head. Mild superior glenoid retroversion. Suspect small glenohumeral joint effusion. Moderate degenerative changes of the acromioclavicular joint. No large subacromial-subdeltoid bursal fluid collection. Remaining visualized osseous structures appear grossly intact. Ligaments Suboptimally assessed by CT. Muscles and Tendons Preserved muscle bulk of the rotator cuff musculature without significant atrophy or fatty infiltration. Rotator cuff tendons appear grossly intact within the limitations of CT. Soft tissues Generalized anasarca. No evidence of a organized fluid collection about the shoulder. IMPRESSION: 1. Severe glenohumeral joint osteoarthritis. Suspect small glenohumeral joint effusion, presumably reactive/degenerative. Septic arthritis is unable to be excluded by imaging. 2. Moderate degenerative changes of the acromioclavicular joint. 3. Generalized anasarca. Electronically Signed   By: Davina Poke D.O.   On: 06/27/2021 18:30   MR SACRUM SI JOINTS WO CONTRAST  Result Date: 06/18/2021 CLINICAL  DATA:  Evaluate for osteomyelitis. MSSA bacteremia and sepsis. EXAM: MRI LUMBAR SPINE WITHOUT CONTRAST TECHNIQUE: Multiplanar, multisequence MR imaging of the lumbar spine was performed. No intravenous contrast was administered. COMPARISON:  Lumbar spine radiographs 06/17/2021; CT abdomen and pelvis 06/01/2021 FINDINGS: Segmentation:  Normal sacral and coccygeal segmentation. Alignment:  Physiologic. Vertebrae and soft tissues: The sacral and coccygeal vertebral body heights are normal. There is soft tissue ulceration seen dorsal to the entire first through fourth coccygeal segments. There is marrow edema throughout all of these 4 coccygeal segments. No marrow edema is seen within the adjacent S5 or higher sacral vertebral bodies. There is cortical erosion at the dorsal and volar aspects of the third and fourth coccygeal segments. There is moderate to high-grade surrounding edema throughout the gluteus musculature and posterior subcutaneous fat. There is decreased T1 and increased T2 signal fluid measuring up to approximately 1.3 cm in AP dimension just anterior to the sacrum and coccyx, greatest at the S4 through second coccygeal levels (sagittal series 3, image 12). The femoral heads are partially visualized. There is serpiginous signal within the bilateral femoral head suspicious for avascular necrosis as also suggested on prior CT. There is severe L5-S1 disc space narrowing with high-grade chronic fat intensity marrow endplate degenerative change. Mild-to-moderate bilateral sacroiliac joint space narrowing and peripheral osteophytosis degenerative changes. The urinary bladder is grossly unremarkable. IMPRESSION: 1. Dorsal soft tissue ulceration adjacent to the first through fourth coccygeal segments. Marrow edema throughout the entire first through fourth coccygeal segments with cortical erosion of the third and fourth segments indicating acute osteomyelitis. 2. High-grade surrounding edema throughout the  gluteus musculature and posterior subcutaneous fat. Presacral fluid measuring up to 1.3 cm in AP  dimension. Otherwise, no walled-off abscess is visualized. Electronically Signed   By: Yvonne Kendall M.D.   On: 06/18/2021 19:26   DG CHEST PORT 1 VIEW  Result Date: 06/28/2021 CLINICAL DATA:  56 year old male with shortness of breath. EXAM: PORTABLE CHEST 1 VIEW COMPARISON:  Chest radiographs 06/17/2021 and earlier. FINDINGS: Portable AP semi upright view at 1043 hours. Increased bilateral pulmonary interstitial opacity, diffuse and slightly greater on the right. Underlying cardiomegaly again noted. Stable cardiac size and mediastinal contours. Visualized tracheal air column is within normal limits. No pneumothorax. No pleural effusion or air bronchograms identified. No acute osseous abnormality identified. Chronic severe degeneration at the right glenohumeral joint. IMPRESSION: Evidence of acute pulmonary edema superimposed on cardiomegaly. Edema perhaps asymmetrically greater on the right. Viral/atypical respiratory infection felt less likely. No pleural effusion or other acute cardiopulmonary abnormality identified. Electronically Signed   By: Genevie Ann M.D.   On: 06/28/2021 10:54   DG CHEST PORT 1 VIEW  Result Date: 06/14/2021 CLINICAL DATA:  Sepsis EXAM: PORTABLE CHEST 1 VIEW COMPARISON:  Previous studies including the CT done on 2021-06-03 FINDINGS: Transverse diameter of heart is increased. Central pulmonary vessels are prominent. Increased interstitial markings are seen in the parahilar regions and lower lung fields. There is homogeneous opacity in the retrocardiac region in the left lower lung fields. There is no pneumothorax. Degenerative changes are noted in the right shoulder. IMPRESSION: Cardiomegaly. Prominence of central pulmonary vessels and increased interstitial markings in the parahilar regions suggest CHF. There is homogeneous infiltrate in the left lower lung fields suggesting  atelectasis/pneumonia and possibly left pleural effusion. Electronically Signed   By: Elmer Picker M.D.   On: 06/14/2021 16:33   DG Abd Portable 1V  Result Date: 06/14/2021 CLINICAL DATA:  Sepsis EXAM: PORTABLE ABDOMEN - 1 VIEW COMPARISON:  05/11/2021 FINDINGS: There is moderate gaseous distention of colon. There is no significant small bowel dilation. There is dense infiltrate with air bronchogram in the medial left lower lung fields suggesting atelectasis/pneumonia. IMPRESSION: There is moderate gaseous distention of colon suggesting ileus. There is dense infiltrate with air bronchogram in the medial left lower lung fields suggesting atelectasis/pneumonia in the left lower lobe. Electronically Signed   By: Elmer Picker M.D.   On: 06/14/2021 16:35   ECHOCARDIOGRAM COMPLETE  Result Date: 06/15/2021    ECHOCARDIOGRAM REPORT   Patient Name:   Logan Nelson Date of Exam: 06/15/2021 Medical Rec #:  [id number]            Height:       66.0 in Accession #:    [id number]           Weight:       154.3 lb Date of Birth:  [birth year]             BSA:          1.791 m Patient Age:    37 years             BP:           115/91 mmHg Patient Gender: M                    HR:           103 bpm. Exam Location:  Inpatient Procedure: 2D Echo, Cardiac Doppler and Color Doppler Indications:    Bacteremia  History:        Patient has prior history of Echocardiogram examinations. Risk  Factors:Hypertension and Former Smoker. GERD.  Sonographer:    Clayton Lefort RDCS (AE) Referring Phys: [id number] [place]  1. There is a mobile filamentous structure in the RA. 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 No valvular lesions are present to suggest endocarditis. Of note, this structure was present on the prior echo from March 2023.  2. LV myocardium is hyper trabeculated. Consider contrast echo or MRI to exclude noncompaction. Left ventricular ejection fraction, by estimation, is 30 to 35%. The left  ventricle has moderately decreased function. The left ventricle demonstrates global  hypokinesis. Indeterminate diastolic filling due to E-A fusion.  3. Right ventricular systolic function is low normal. The right ventricular size is normal. There is normal pulmonary artery systolic pressure. The estimated right ventricular systolic pressure is 79.8 mmHg.  4. Left atrial size was severely dilated.  5. Mild to moderate mitral regurgitation is present on this study. The prior study demonstrated possible moderate to severe MR. Would recommend TEE for clarification. The mitral valve is grossly normal. Mild to moderate mitral valve regurgitation. No evidence of mitral stenosis.  6. The aortic valve is tricuspid. There is mild calcification of the aortic valve. Aortic valve regurgitation is not visualized. Aortic valve sclerosis is present, with no evidence of aortic valve stenosis.  7. The inferior vena cava is normal in size with greater than 50% respiratory variability, suggesting right atrial pressure of 3 mmHg. Comparison(s): Changes from prior study are noted. EF now 30-35%. MR is now mild to moderate. 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 FINDINGS  Left Ventricle: LV myocardium is hyper trabeculated. Consider contrast echo or MRI to exclude noncompaction. Left ventricular ejection fraction, by estimation, is 30 to 35%. The left ventricle has moderately decreased function. The left ventricle demonstrates global hypokinesis. The left ventricular internal cavity size was normal in size. There is no left ventricular hypertrophy. Indeterminate diastolic filling due to E-A fusion. Right Ventricle: The right ventricular size is normal. No increase in right ventricular wall thickness. Right ventricular systolic function is low normal. There is normal pulmonary artery systolic pressure. The tricuspid regurgitant velocity is 2.87 m/s,  and with an assumed right atrial pressure of  3 mmHg, the estimated right ventricular systolic pressure is 92.1 mmHg. Left Atrium: Left atrial size was severely dilated. Right Atrium: Right atrial size was normal in size. Prominent Chiari network. Pericardium: Trivial pericardial effusion is present. Mitral Valve: Mild to moderate mitral regurgitation is present on this study. The prior study demonstrated possible moderate to severe MR. Would recommend TEE for clarification. The mitral valve is grossly normal. Mild to moderate mitral valve regurgitation. No evidence of mitral valve stenosis. Tricuspid Valve: The tricuspid valve is grossly normal. Tricuspid valve regurgitation is trivial. No evidence of tricuspid stenosis. Aortic Valve: The aortic valve is tricuspid. There is mild calcification of the aortic valve. Aortic valve regurgitation is not visualized. Aortic valve sclerosis is present, with no evidence of aortic valve stenosis. Aortic valve mean gradient measures 3.0 mmHg. Aortic valve peak gradient measures 4.5 mmHg. Aortic valve area, by VTI measures 2.61 cm. Pulmonic Valve: The pulmonic valve was grossly normal. Pulmonic valve regurgitation is not visualized. No evidence of pulmonic stenosis. Aorta: The aortic root and ascending aorta are structurally normal, with no evidence of dilitation. Venous: The inferior vena cava is normal in size with greater than 50% respiratory variability, suggesting right atrial pressure of 3 mmHg. IAS/Shunts: The atrial septum is grossly  normal.  LEFT VENTRICLE PLAX 2D LVIDd:         5.20 cm LVIDs:         4.30 cm LV PW:         1.10 cm LV IVS:        1.10 cm LVOT diam:     2.30 cm      3D Volume EF: LV SV:         46           3D EF:        47 % LV SV Index:   26           LV EDV:       191 ml LVOT Area:     4.15 cm     LV ESV:       100 ml                             LV SV:        91 ml  LV Volumes (MOD) LV vol d, MOD A2C: 115.0 ml LV vol d, MOD A4C: 155.0 ml LV vol s, MOD A2C: 75.1 ml LV vol s, MOD A4C: 92.9 ml LV  SV MOD A2C:     39.9 ml LV SV MOD A4C:     155.0 ml LV SV MOD BP:      53.4 ml RIGHT VENTRICLE             IVC RV Basal diam:  3.50 cm     IVC diam: 1.60 cm RV S prime:     13.70 cm/s TAPSE (M-mode): 2.2 cm LEFT ATRIUM             Index        RIGHT ATRIUM           Index LA diam:        3.70 cm 2.07 cm/m   RA Area:     18.40 cm LA Vol (A2C):   86.6 ml 48.35 ml/m  RA Volume:   51.00 ml  28.47 ml/m LA Vol (A4C):   92.7 ml 51.75 ml/m LA Biplane Vol: 94.7 ml 52.87 ml/m  AORTIC VALVE AV Area (Vmax):    2.94 cm AV Area (Vmean):   2.89 cm AV Area (VTI):     2.61 cm AV Vmax:           106.00 cm/s AV Vmean:          74.400 cm/s AV VTI:            0.177 m AV Peak Grad:      4.5 mmHg AV Mean Grad:      3.0 mmHg LVOT Vmax:         75.10 cm/s LVOT Vmean:        51.800 cm/s LVOT VTI:          0.111 m LVOT/AV VTI ratio: 0.63  AORTA Ao Root diam: 3.70 cm Ao Asc diam:  3.70 cm MR Peak grad: 98.8 mmHg   TRICUSPID VALVE MR Mean grad: 62.0 mmHg   TR Peak grad:   32.9 mmHg MR Vmax:      497.00 cm/s TR Vmax:        287.00 cm/s MR Vmean:     368.0 cm/s  SHUNTS                           Systemic VTI:  0.11 m                           Systemic Diam: 2.30 cm Eleonore Chiquito MD Electronically signed by Eleonore Chiquito MD Signature Date/Time: 06/15/2021/5:02:31 PM    Final    VAS [name] UPPER EXTREMITY VENOUS DUPLEX  Result Date: 06/28/2021 UPPER VENOUS STUDY  Patient Name:  Logan Nelson  Date of Exam:   06/28/2021 Medical Rec #: [id number]             Accession #:    [id number] Date of Birth: 1965-10-14              Patient Gender: M Patient Age:   58 years Exam Location[facility]  [facility] Procedure:      VAS [name] UPPER EXTREMITY VENOUS DUPLEX Referring Phys: Hosie Poisson --------------------------------------------------------------------------------  Indications: Edema Limitations: Edema, bandages, line. Comparison Study: Prior negative right upper extremity venous duplex done 06/11/21 Performing  Technologist: Sharion Dove RVS  Examination Guidelines: A complete evaluation includes B-mode imaging, spectral Doppler, color Doppler, and power Doppler as needed of all accessible portions of each vessel. Bilateral testing is considered an integral part of a complete examination. Limited examinations for reoccurring indications may be performed as noted.  Right Findings: +----------+------------+---------+-----------+----------+-------+ RIGHT     CompressiblePhasicitySpontaneousPropertiesSummary +----------+------------+---------+-----------+----------+-------+ IJV           Full       Yes       Yes                      +----------+------------+---------+-----------+----------+-------+ Subclavian               Yes       Yes                      +----------+------------+---------+-----------+----------+-------+ Axillary      Full       Yes       Yes                      +----------+------------+---------+-----------+----------+-------+ Brachial      Full                                          +----------+------------+---------+-----------+----------+-------+ Radial        Full                                          +----------+------------+---------+-----------+----------+-------+ Ulnar         Full                                          +----------+------------+---------+-----------+----------+-------+ Cephalic      Full                                          +----------+------------+---------+-----------+----------+-------+ Basilic  Yes       Yes                      +----------+------------+---------+-----------+----------+-------+  Left Findings: +----------+------------+---------+-----------+----------+-------+ LEFT      CompressiblePhasicitySpontaneousPropertiesSummary +----------+------------+---------+-----------+----------+-------+ Subclavian               Yes       Yes                       +----------+------------+---------+-----------+----------+-------+  Summary:  Right: No evidence of deep vein thrombosis in the upper extremity. No evidence of superficial vein thrombosis in the upper extremity. No change since study done 06/11/21  Left: No evidence of thrombosis in the subclavian.  *See table(s) above for measurements and observations.  Diagnosing physician: Monica Martinez MD Electronically signed by Monica Martinez MD on 06/28/2021 at 5:17:26 PM.    Final    VAS [name] UPPER EXTREMITY VENOUS DUPLEX  Result Date: 06/11/2021 UPPER VENOUS STUDY  Patient Name:  Logan Nelson  Date of Exam:   06/11/2021 Medical Rec #: [id number]             Accession #:    [id number] Date of Birth: [birth year]              Patient Gender: M Patient Age:   58 years Exam Location:  [facility] Procedure:      VAS [name] UPPER EXTREMITY VENOUS DUPLEX Referring Phys: PRANAV PATEL --------------------------------------------------------------------------------  Indications: Edema Comparison Study: no prior Performing Technologist: Archie Patten RVS  Examination Guidelines: A complete evaluation includes B-mode imaging, spectral Doppler, color Doppler, and power Doppler as needed of all accessible portions of each vessel. Bilateral testing is considered an integral part of a complete examination. Limited examinations for reoccurring indications may be performed as noted.  Right Findings: +----------+------------+---------+-----------+----------+-------+ RIGHT     CompressiblePhasicitySpontaneousPropertiesSummary +----------+------------+---------+-----------+----------+-------+ IJV           Full       Yes       Yes                      +----------+------------+---------+-----------+----------+-------+ Subclavian    Full       Yes       Yes                      +----------+------------+---------+-----------+----------+-------+ Axillary      Full       Yes       Yes                       +----------+------------+---------+-----------+----------+-------+ Brachial      Full       Yes       Yes                      +----------+------------+---------+-----------+----------+-------+ Radial        Full                                          +----------+------------+---------+-----------+----------+-------+ Ulnar         Full                                          +----------+------------+---------+-----------+----------+-------+  Cephalic      Full                                          +----------+------------+---------+-----------+----------+-------+ Basilic       Full                                          +----------+------------+---------+-----------+----------+-------+  Left Findings: +----------+------------+---------+-----------+----------+-------+ LEFT      CompressiblePhasicitySpontaneousPropertiesSummary +----------+------------+---------+-----------+----------+-------+ Subclavian               Yes       Yes                      +----------+------------+---------+-----------+----------+-------+  Summary:  Right: No evidence of deep vein thrombosis in the upper extremity. No evidence of superficial vein thrombosis in the upper extremity.  Left: No evidence of thrombosis in the subclavian.  *See table(s) above for measurements and observations.  Diagnosing physician: Harold Barban MD Electronically signed by Harold Barban MD on 06/11/2021 at 8:13:59 PM.    Final    [name] EKG SITE RITE  Result Date: 06/29/2021 If Site Rite image not attached, placement could not be confirmed due to current cardiac rhythm.  [name] EKG SITE RITE  Result Date: 06/24/2021 If Site Rite image not attached, placement could not be confirmed due to current cardiac rhythm.   Microbiology: No results found for this or any previous visit (from the past 240 hour(s)).   Labs: Basic Metabolic Panel: Recent Labs  Lab 06/29/21 0152 06/30/21 0340 07/01/21 0414  07/03/21 0458 07/04/21 0458  NA 139 138 138 137 138  K 4.5 4.0 4.1 4.1 4.6  CL 108 107 105 104 105  CO2 25 26 26 28 27   GLUCOSE 103* 92 79 101* 84  BUN 13 10 14 14 15   CREATININE 0.53* 0.48* 0.50* 0.52* 0.44*  CALCIUM 8.7* 8.3* 8.6* 8.5* 8.9  MG 1.8 1.7  --  1.6*  --   PHOS 3.0 2.6  --   --   --    Liver Function Tests: No results for input(s): AST, ALT, ALKPHOS, BILITOT, PROT, ALBUMIN in the last 168 hours. No results for input(s): LIPASE, AMYLASE in the last 168 hours. No results for input(s): AMMONIA in the last 168 hours. CBC: Recent Labs  Lab 06/29/21 0152 06/30/21 0340 07/01/21 0414 07/02/21 0422  WBC 13.5* 10.1 10.4 11.5*  NEUTROABS 9.4* 5.5  --   --   HGB 9.6* 8.5* 9.8* 9.5*  HCT 31.7* 28.2* 32.3* 31.1*  MCV 94.3 96.2 97.0 97.2  PLT 423* 352 373 351   Cardiac Enzymes: No results for input(s): CKTOTAL, CKMB, CKMBINDEX, TROPONINI in the last 168 hours. BNP: BNP (last 3 results) Recent Labs    03/30/21 1129 06/28/21 1236  BNP 63.5 >4,500.0*    ProBNP (last 3 results) No results for input(s): PROBNP in the last 8760 hours.  CBG: No results for input(s): GLUCAP in the last 168 hours.     Signed:  Domenic Polite MD.  [facility] 07/05/2021, 9:41 AM

## 2021-07-05 NOTE — Progress Notes (Signed)
Mobility Specialist Progress Note:   07/05/21 1041  Mobility  Activity Ambulated with assistance in hallway  Level of Assistance Standby assist, set-up cues, supervision of patient - no hands on  Assistive Device Front wheel walker  Distance Ambulated (ft) 500 ft  Activity Response Tolerated well  $Mobility charge 1 Mobility   Pt received EOB willing to participate in mobility. No complaints of pain. Left in bed with call bell in reach and all needs met.   [facility] Dejai Schubach Mobility Specialist

## 2021-07-05 NOTE — TOC Transition Note (Signed)
Transition of Care [facility]) - CM/SW Discharge Note   Patient Details  Name: Logan Nelson MRN: [id number] Date of Birth: [birth year]  [facility] CM/SW Contact:  Bethann Berks[place], [place] Phone Number: 07/05/2021, 10:25 AM   Clinical Narrative:      Patient will DC to: [facility] Anticipated DC date: 07/05/21 Transport by: Corey Harold   Per MD patient ready for DC to [facility]. RN, patient, and facility notified of DC. Discharge Summary and FL2 sent to facility. RN to call report prior to discharge [phone] Room 118A). DC packet on chart. Ambulance transport requested for patient.   CSW will sign off for now as social work intervention is no longer needed. Please consult [name] again if new needs arise.   Final next level of care: Skilled Nursing Facility Barriers to Discharge: No Barriers Identified   Patient Goals and CMS Choice        Discharge Placement              Patient chooses bed at:  [facility]) Patient to be transferred to facility by: [facility]   Patient and family notified of of transfer: 07/05/21  Discharge Plan and Services                                     Social Determinants of Health (SDOH) Interventions Food Insecurity Interventions: Intervention Not Indicated Housing Interventions: Intervention Not Indicated Transportation Interventions: Intervention Not Indicated   Readmission Risk Interventions    07/02/2021    2:35 PM 06/08/2021    1:48 PM 05/05/2021   10:01 AM  Readmission Risk Prevention Plan  Transportation Screening  Complete Complete  Medication Review (RN Care Manager)  Referral to Pharmacy Referral to Pharmacy  PCP or Specialist appointment within 3-5 days of discharge  Complete Complete  HRI or Home Care Consult  Patient refused Patient refused  SW Recovery Care/Counseling Consult  Complete Complete  Palliative Care Screening  Not Applicable Not Applicable  Skilled Nursing Facility Complete Not Applicable  Complete

## 2021-07-05 NOTE — Progress Notes (Signed)
RN called report to receiving facility and spoke with [place] RN.

## 2021-07-07 DIAGNOSIS — L89159 Pressure ulcer of sacral region, unspecified stage: Secondary | ICD-10-CM | POA: Diagnosis not present

## 2021-07-07 DIAGNOSIS — I509 Heart failure, unspecified: Secondary | ICD-10-CM | POA: Diagnosis not present

## 2021-07-07 DIAGNOSIS — D649 Anemia, unspecified: Secondary | ICD-10-CM | POA: Diagnosis not present

## 2021-07-07 DIAGNOSIS — I10 Essential (primary) hypertension: Secondary | ICD-10-CM | POA: Diagnosis not present

## 2021-07-07 DIAGNOSIS — R7881 Bacteremia: Secondary | ICD-10-CM | POA: Diagnosis not present

## 2021-07-07 DIAGNOSIS — I4891 Unspecified atrial fibrillation: Secondary | ICD-10-CM | POA: Diagnosis not present

## 2021-07-07 DIAGNOSIS — B9561 Methicillin susceptible Staphylococcus aureus infection as the cause of diseases classified elsewhere: Secondary | ICD-10-CM | POA: Diagnosis not present

## 2021-07-08 DIAGNOSIS — I10 Essential (primary) hypertension: Secondary | ICD-10-CM | POA: Diagnosis not present

## 2021-07-10 ENCOUNTER — Emergency Department (HOSPITAL_COMMUNITY)
Admission: EM | Admit: 2021-07-10 | Discharge: 2021-07-10 | Disposition: A | Discharge status: Skilled Nursing Facility | Payer: Medicaid Other | Attending: Emergency Medicine | Admitting: Emergency Medicine

## 2021-07-10 ENCOUNTER — Other Ambulatory Visit: Payer: Self-pay

## 2021-07-10 ENCOUNTER — Encounter (HOSPITAL_COMMUNITY): Payer: Self-pay

## 2021-07-10 DIAGNOSIS — T82898A Other specified complication of vascular prosthetic devices, implants and grafts, initial encounter: Secondary | ICD-10-CM | POA: Diagnosis not present

## 2021-07-10 DIAGNOSIS — X58XXXA Exposure to other specified factors, initial encounter: Secondary | ICD-10-CM | POA: Diagnosis not present

## 2021-07-10 MED ORDER — CEFAZOLIN SODIUM-DEXTROSE 2-4 GM/100ML-% IV SOLN
2.0000 g | Freq: Once | INTRAVENOUS | Status: AC
Start: 2021-07-10 — End: 2021-07-10
  Administered 2021-07-10: 2 g via INTRAVENOUS
  Filled 2021-07-10: qty 100

## 2021-07-10 NOTE — ED Provider Notes (Signed)
[facility] EMERGENCY DEPARTMENT Provider Note   CSN: [id number] Arrival date & time: 07/10/21  0827     History  Chief Complaint  Patient presents with   PICC line issues    Logan Nelson is a 55 y.o. male.  Patient sent here from nursing home due to PICC line issue.  Has been getting IV Ancef for surgical wound infection.  Recently discharged.  Denies any complaints.  No fevers or chills.  The history is provided by the patient.      Home Medications Prior to Admission medications   Medication Sig Start Date End Date Taking? Authorizing Provider  acetaminophen (TYLENOL) 325 MG tablet Take 2 tablets (650 mg total) by mouth every 6 (six) hours as needed for mild pain ((score 1 to 3) or temp > 100.5). 07/04/21   Domenic Polite, MD  ceFAZolin (ANCEF) IVPB Inject 2 g into the vein every 8 (eight) hours. Indication:  MSSA bacteremia/cervical epidural abscess First Dose: Yes Last Day of Therapy:  08/04/21 Labs - Once weekly:  CBC/D and BMP, Labs - Every other week:  ESR and CRP 07/04/21 08/07/21  Domenic Polite, MD  furosemide (LASIX) 40 MG tablet Take 1 tablet (40 mg total) by mouth daily. 07/04/21   Domenic Polite, MD  losartan (COZAAR) 25 MG tablet Take 1 tablet (25 mg total) by mouth daily. 07/04/21   Domenic Polite, MD  metoprolol succinate (TOPROL-XL) 100 MG 24 hr tablet Take 1 tablet (100 mg total) by mouth daily. Take with or immediately following a meal. 07/04/21   Domenic Polite, MD  oxyCODONE (OXY IR/ROXICODONE) 5 MG immediate release tablet Take 1 tablet (5 mg total) by mouth every 6 (six) hours as needed for moderate pain. 07/04/21   Domenic Polite, MD  potassium chloride SA (KLOR-CON M) 20 MEQ tablet Take 1 tablet (20 mEq total) by mouth daily. 07/04/21   Domenic Polite, MD  senna-docusate (SENOKOT-S) 8.6-50 MG tablet Take 1 tablet by mouth 2 (two) times daily. 07/04/21   Domenic Polite, MD  spironolactone (ALDACTONE) 25 MG tablet Take 1 tablet (25  mg total) by mouth daily. 07/04/21   Domenic Polite, MD      Allergies    Patient has no known allergies.    Review of Systems   Review of Systems  Physical Exam Updated Vital Signs BP 117/77   Pulse 86   Temp 98.1 F (36.7 C)   Resp 17   SpO2 99%  Physical Exam Skin:    Comments: Sacral wound with clean margins, no signs of infection  Neurological:     Mental Status: He is alert.    ED Results / Procedures / Treatments   Labs (all labs ordered are listed, but only abnormal results are displayed) Labs Reviewed - No data to display  EKG None  Radiology No results found.  Procedures Procedures    Medications Ordered in ED Medications  ceFAZolin (ANCEF) IVPB 2g/100 mL premix (2 g Intravenous New Bag/Given 07/10/21 0857)    ED Course/ Medical Decision Making/ A&P                           Medical Decision Making Risk Prescription drug management.   Logan Nelson is here with PICC line issue.  Normal vitals.  No fever.  Patient on IV Ancef for sacral wound/bacteremia.  PICC line was not flushing at nursing home today was sent here and nurse was able to  flush PICC line without any issues with good blood return.  Patient was given a dose IV Ancef.  His sacral wound overall appears well.  Discharged in good condition.  This chart was dictated using voice recognition software.  Despite best efforts to proofread,  errors can occur which can change the documentation meaning.         Final Clinical Impression(s) / ED Diagnoses Final diagnoses:  Occluded PICC line, initial encounter [facility])    Rx / [place] Orders ED Discharge Orders     None         Lennice Sites, DO 07/10/21 6283

## 2021-07-10 NOTE — Progress Notes (Signed)
PICc exchange ordered, secure chat with megan RN re.  States she was able to flush and obtain good blood return.  No need for PICC exchange at this time, Order canceled.

## 2021-07-10 NOTE — ED Notes (Signed)
[facility] called to transport patient  

## 2021-07-10 NOTE — ED Notes (Addendum)
Able to flush  picc line and received blood return. Flushed with 20cc and cap changed.

## 2021-07-10 NOTE — ED Triage Notes (Addendum)
Pt BIB [facility] from [facility] in [place] d/t his Rt sided PICC line not flushing since this morning. A/Ox4, verbal- able to make needs known. Pt also c/o a sore on his bottom. 138/92, 78 bpm, 18 resp, 100% on RA, CBG 106.

## 2021-07-10 NOTE — Discharge Instructions (Signed)
PICC line is working.  Patient was given morning dose of Ancef.

## 2021-07-12 DIAGNOSIS — F331 Major depressive disorder, recurrent, moderate: Secondary | ICD-10-CM | POA: Diagnosis not present

## 2021-07-13 ENCOUNTER — Telehealth (HOSPITAL_COMMUNITY): Payer: Self-pay | Admitting: *Deleted

## 2021-07-13 ENCOUNTER — Encounter (HOSPITAL_COMMUNITY): Payer: Medicaid Other

## 2021-07-13 NOTE — Telephone Encounter (Signed)
Heart Failure Nurse Navigator Progress Note   Unable to leave appointment reminder message for 11 am appointment on 07/13/21.  Rhae Hammock, BSN, Scientist, clinical (histocompatibility and immunogenetics) Only

## 2021-07-14 DIAGNOSIS — M6281 Muscle weakness (generalized): Secondary | ICD-10-CM | POA: Diagnosis not present

## 2021-07-14 DIAGNOSIS — E440 Moderate protein-calorie malnutrition: Secondary | ICD-10-CM | POA: Diagnosis not present

## 2021-07-14 DIAGNOSIS — L89153 Pressure ulcer of sacral region, stage 3: Secondary | ICD-10-CM | POA: Diagnosis not present

## 2021-07-15 DIAGNOSIS — R600 Localized edema: Secondary | ICD-10-CM | POA: Diagnosis not present

## 2021-07-17 DIAGNOSIS — I10 Essential (primary) hypertension: Secondary | ICD-10-CM | POA: Diagnosis not present

## 2021-07-20 DIAGNOSIS — I10 Essential (primary) hypertension: Secondary | ICD-10-CM | POA: Diagnosis not present

## 2021-07-20 DIAGNOSIS — R701 Abnormal plasma viscosity: Secondary | ICD-10-CM | POA: Diagnosis not present

## 2021-07-20 DIAGNOSIS — R7982 Elevated C-reactive protein (CRP): Secondary | ICD-10-CM | POA: Diagnosis not present

## 2021-07-20 DIAGNOSIS — R700 Elevated erythrocyte sedimentation rate: Secondary | ICD-10-CM | POA: Diagnosis not present

## 2021-07-21 DIAGNOSIS — M4628 Osteomyelitis of vertebra, sacral and sacrococcygeal region: Secondary | ICD-10-CM | POA: Diagnosis not present

## 2021-07-21 DIAGNOSIS — E440 Moderate protein-calorie malnutrition: Secondary | ICD-10-CM | POA: Diagnosis not present

## 2021-07-21 DIAGNOSIS — R7881 Bacteremia: Secondary | ICD-10-CM | POA: Diagnosis not present

## 2021-07-21 DIAGNOSIS — I4891 Unspecified atrial fibrillation: Secondary | ICD-10-CM | POA: Diagnosis not present

## 2021-07-21 DIAGNOSIS — I5021 Acute systolic (congestive) heart failure: Secondary | ICD-10-CM | POA: Diagnosis not present

## 2021-07-21 DIAGNOSIS — I10 Essential (primary) hypertension: Secondary | ICD-10-CM | POA: Diagnosis not present

## 2021-07-21 DIAGNOSIS — B9561 Methicillin susceptible Staphylococcus aureus infection as the cause of diseases classified elsewhere: Secondary | ICD-10-CM | POA: Diagnosis not present

## 2021-07-21 DIAGNOSIS — L89153 Pressure ulcer of sacral region, stage 3: Secondary | ICD-10-CM | POA: Diagnosis not present

## 2021-07-21 DIAGNOSIS — G061 Intraspinal abscess and granuloma: Secondary | ICD-10-CM | POA: Diagnosis not present

## 2021-07-21 DIAGNOSIS — M6281 Muscle weakness (generalized): Secondary | ICD-10-CM | POA: Diagnosis not present

## 2021-07-21 DIAGNOSIS — L89159 Pressure ulcer of sacral region, unspecified stage: Secondary | ICD-10-CM | POA: Diagnosis not present

## 2021-07-26 DIAGNOSIS — I509 Heart failure, unspecified: Secondary | ICD-10-CM | POA: Diagnosis not present

## 2021-07-26 DIAGNOSIS — R7881 Bacteremia: Secondary | ICD-10-CM | POA: Diagnosis not present

## 2021-07-26 DIAGNOSIS — R600 Localized edema: Secondary | ICD-10-CM | POA: Diagnosis not present

## 2021-07-28 DIAGNOSIS — M6281 Muscle weakness (generalized): Secondary | ICD-10-CM | POA: Diagnosis not present

## 2021-07-28 DIAGNOSIS — R600 Localized edema: Secondary | ICD-10-CM | POA: Diagnosis not present

## 2021-07-28 DIAGNOSIS — L89153 Pressure ulcer of sacral region, stage 3: Secondary | ICD-10-CM | POA: Diagnosis not present

## 2021-07-28 DIAGNOSIS — M25511 Pain in right shoulder: Secondary | ICD-10-CM | POA: Diagnosis not present

## 2021-07-28 DIAGNOSIS — M25512 Pain in left shoulder: Secondary | ICD-10-CM | POA: Diagnosis not present

## 2021-07-28 DIAGNOSIS — E440 Moderate protein-calorie malnutrition: Secondary | ICD-10-CM | POA: Diagnosis not present

## 2021-07-29 ENCOUNTER — Ambulatory Visit: Payer: Medicaid Other | Admitting: Infectious Diseases

## 2021-07-29 DIAGNOSIS — I10 Essential (primary) hypertension: Secondary | ICD-10-CM | POA: Diagnosis not present

## 2021-08-02 DIAGNOSIS — M25511 Pain in right shoulder: Secondary | ICD-10-CM | POA: Diagnosis not present

## 2021-08-02 DIAGNOSIS — M19011 Primary osteoarthritis, right shoulder: Secondary | ICD-10-CM | POA: Diagnosis not present

## 2021-08-02 DIAGNOSIS — I10 Essential (primary) hypertension: Secondary | ICD-10-CM | POA: Diagnosis not present

## 2021-08-02 DIAGNOSIS — D649 Anemia, unspecified: Secondary | ICD-10-CM | POA: Diagnosis not present

## 2021-08-02 DIAGNOSIS — R7881 Bacteremia: Secondary | ICD-10-CM | POA: Diagnosis not present

## 2021-08-02 DIAGNOSIS — I509 Heart failure, unspecified: Secondary | ICD-10-CM | POA: Diagnosis not present

## 2021-08-04 DIAGNOSIS — M6281 Muscle weakness (generalized): Secondary | ICD-10-CM | POA: Diagnosis not present

## 2021-08-04 DIAGNOSIS — E440 Moderate protein-calorie malnutrition: Secondary | ICD-10-CM | POA: Diagnosis not present

## 2021-08-04 DIAGNOSIS — L89153 Pressure ulcer of sacral region, stage 3: Secondary | ICD-10-CM | POA: Diagnosis not present

## 2021-08-07 DIAGNOSIS — E43 Unspecified severe protein-calorie malnutrition: Secondary | ICD-10-CM | POA: Diagnosis not present

## 2021-08-09 DIAGNOSIS — I509 Heart failure, unspecified: Secondary | ICD-10-CM | POA: Diagnosis not present

## 2021-08-09 DIAGNOSIS — F331 Major depressive disorder, recurrent, moderate: Secondary | ICD-10-CM | POA: Diagnosis not present

## 2021-08-09 DIAGNOSIS — R7881 Bacteremia: Secondary | ICD-10-CM | POA: Diagnosis not present

## 2021-08-09 DIAGNOSIS — I10 Essential (primary) hypertension: Secondary | ICD-10-CM | POA: Diagnosis not present

## 2021-08-09 DIAGNOSIS — M25511 Pain in right shoulder: Secondary | ICD-10-CM | POA: Diagnosis not present

## 2021-08-09 DIAGNOSIS — D649 Anemia, unspecified: Secondary | ICD-10-CM | POA: Diagnosis not present

## 2021-08-09 DIAGNOSIS — R600 Localized edema: Secondary | ICD-10-CM | POA: Diagnosis not present

## 2021-08-09 DIAGNOSIS — L89159 Pressure ulcer of sacral region, unspecified stage: Secondary | ICD-10-CM | POA: Diagnosis not present

## 2021-08-09 DIAGNOSIS — I4891 Unspecified atrial fibrillation: Secondary | ICD-10-CM | POA: Diagnosis not present

## 2021-08-18 NOTE — Progress Notes (Addendum)
Subjective:    Logan Nelson - 56 y.o. male MRN [id number]  Date of birth: 11-Jun-1965  HPI  Logan Nelson is a 56 year-old male who presents for hospital discharge follow-up.   Current issues and/or concerns: 06/16/2021 - 07/05/2021 [facility] per MD note: Admit date: 06/16/2021 Discharge date: 07/05/2021   Time spent: 45 minutes   Recommendations for Outpatient Follow-up:  SNF for short-term rehab Continue IV Ancef until 6/28 to complete 6-week course, will need PICC line removed after completion of antibiotic course CHF TOC clinic on 6/6   Discharge Diagnoses:  Principal Problem:   MSSA bacteremia C-spine epidural abscess Anasarca Hypoalbuminemia   Acute systolic (congestive) heart failure (HCC)   Prolonged QT interval   Protein-calorie malnutrition, severe   Generalized weakness   Pressure injury of buttock, stage 2 (HCC)   Hypertension   Anemia of chronic disease   Epidural abscess   Abscess in epidural space of cervical spine   History of present illness:  56 y.o. male with past medical history of severe protein calorie malnutrition, hypertension, anasarca, abdominal wall hernia presented to the hospital with multiple falls and swelling. -Recently hospitalized for incarcerated abdominal hernia, had primary repair but left AMA before he could be placed in rehab -Admitted 4/3 with severe debility, protein calorie malnutrition and failure to thrive [facility] course complicated by sepsis, MSSA bacteremia - ID following.  Will be undergoing MRI of the spine and sacrum to rule out osteomyelitis.2D Echo also showed worsening EF negative for vegetation -On 5/10 he left AMA, back in the ER same night -MRI thoracic lumbar and sacral spine noted concern for coccygeal osteomyelitis and cervical spine epidural abscess without cord compression, seen by neurosurgery, underwent anterior C4/C5 decompression, arthrodesis C4-C5 with 7 mm structural allograft and  anterior instrumentation, TEE was deferred acutely in the setting of recent C-spine surgery -5/24 AM developed transient rapid A-fib -Volume overload improving on diuretics     Hospital Course:    MSSA bacteremia  Cervical spine epidural abscess Coccygeal osteomyelitis -Neurosurgery consulted underwent anterior cervical decompression, abscess evacuation and instrumentation 5/17, OR cultures grew MSSA -Remains on IV cefazolin to complete 6-week course on 6/28 -Seen by cardiology, felt to be high risk for TEE at this time, will require long-term antibiotic course regardless -Repeat blood cultures are negative -PICC line placed 5/25 -Follow-up with infectious disease   Infected sacral decubitus ulcer -Completed 10 days of IV Unasyn, currently on IV Ancef for MSSA bacteremia -Continue local wound care   Right forearm swelling and report of right shoulder pain -CT right shoulder showed severe glenohumeral osteoarthritis with small effusion -Duplex negative for DVT -Any now with hardly any shoulder symptoms   Acute systolic congestive heart failure: Echocardiogram 3/23 with ejection fraction 60 to 65% Repeat echocardiogram with 30 to 35%, seen by cardiology this admission, not felt to be appropriate for ischemic work-up now in the setting of bacteremia, osteomyelitis abscess etc. -Improving on diuretics, he is 19 L negative, creatinine is stable -Started on Aldactone, losartan, transitioned to oral diuretics yesterday -Not a good candidate for SGLT2 he is high risk of UTI in the setting of above issues -CHF ToC follow up arranged   Transient A-fib RVR, 5/24 early a.m. -Back in sinus rhythm, continue metoprolol -Appreciate cardiology input, called and discussed with neurosurgery Dr. Christella Noa 5/25, he is okay with systemic anticoagulation at this time -Discussed with cards, not felt to be a good long-term anticoagulation candidate, fortunately remains in sinus rhythm  Severe protein  calorie malnutrition: Continue supplements  Severe debility, frailty: Continue PT OT, SNF recommended TOC consulting,.  Patient will need prolonged antibiotics.     Essential hypertension: Blood pressure are stable.   Anemia of chronic disease: 3 PRBC transfusion during previous hospitalization.  No active bleeding.  -Stable, monitor   Hypomagnesemia: Replaced    Pressure wound  Pressure Injury 06/17/21 Sacrum Stage 3 -  Full thickness tissue loss. Subcutaneous fat may be visible but bone, tendon or muscle are NOT exposed. 2 in vertical by .5 in horizontal--approximately--wound tunneling and pink (Active)  06/17/21 1159  Location: Sacrum  Location Orientation:   Staging: Stage 3 -  Full thickness tissue loss. Subcutaneous fat may be visible but bone, tendon or muscle are NOT exposed.  Wound Description (Comments): 2 in vertical by .5 in horizontal--approximately--wound tunneling and pink  Present on Admission:     Consultants:  Infectious disease Neurosurgery ENT,  Home Infusion Instructions    Home infusion instructions       Question:  Instructions  Answer:  Flushing of vascular access device: 0.9% NaCl pre/post medication administration and prn patency; Heparin 100 u/ml, 32ml for implanted ports and Heparin 10u/ml, 51ml for all other central venous catheters.     Discharge wound care:       Comments: Apply a moistened saline gauze into the sacral wound loosely (do not pack tight), then cover with dry 4 x 4 and secure with sacral foam dressing, tip up. Change daily or PRN soiling.   07/04/21 0930    Follow-Ups Follow up with Coletta Memos, MD (Neurosurgery) in 3 months (09/30/2021) Follow up with Odette Fraction, MD (Infectious Diseases) on 07/29/2021; Appointment at 10:30am Go to [facility] (Cardiology) in 11 days (07/13/2021); Hospital follow up  PLEASE bring current medication list to appointment  FREE valet parking, Entrance C, off  [address]  07/10/2021 [facility] Emergency Department per DO note: Logan Nelson is here with PICC line issue.  Normal vitals.  No fever.  Patient on IV Ancef for sacral wound/bacteremia.  PICC line was not flushing at nursing home today was sent here and nurse was able to flush PICC line without any issues with good blood return.  Patient was given a dose IV Ancef.  His sacral wound overall appears well.  Discharged in good condition.   This chart was dictated using voice recognition software.  Despite best efforts to proofread,  errors can occur which can change the documentation meaning.    Today's visit 08/25/2021: Reports feeling overall improved since hospital discharge. However, right upper extremity pain persisting with limited range of motion. Oxycodone and ointment helps and requesting refills. Persisting sacral pain. He is cleansing area with soap/water and placing a gauze after. PICC line removed on 08/09/2021 prior to discharge from rehab facility. He is back to eating/drinking as normal. Needs medications refilled. Reports has not been to any recommended specialists/therapy appointments. Reports he wasn't aware of referral recommendations. Nelson when he discharged from the hospital they only told him to come to primary care.     ROS per HPI    Health Maintenance:  Health Maintenance Due  Topic Date Due   COVID-19 Vaccine (1) Never done   TETANUS/TDAP  Never done   COLONOSCOPY (Pts 45-47yrs Insurance coverage will need to be confirmed)  Never done   Zoster Vaccines- Shingrix (1 of 2) Never done     Past Medical History: Patient  Active Problem List   Diagnosis Date Noted   Abscess in epidural space of cervical spine 06/23/2021   Epidural abscess    Acute systolic (congestive) heart failure (HCC) 06/17/2021   Prolonged QT interval 06/17/2021   MSSA bacteremia    Pressure ulcer of sacral region, stage 3 (HCC)    Acute heart failure with preserved  ejection fraction (HFpEF) (HCC) 06/13/2021   Anasarca    Pressure injury of sacral region, unstageable (HCC) 05/25/2021   Pressure injury of buttock, stage 2 (HCC) 05/14/2021   Hypomagnesemia 05/13/2021   Vitamin D deficiency 05/13/2021   Generalized weakness 05/12/2021   Hypophosphatemia 05/12/2021   Lower extremity weakness 05/11/2021   Hypokalemia 05/11/2021   Anemia of chronic disease 05/11/2021   GERD (gastroesophageal reflux disease) 05/11/2021   Bilateral inguinal hernia 04/23/2021   Protein-calorie malnutrition, severe 04/14/2021   Protein calorie malnutrition (HCC) 04/13/2021   Protein-calorie malnutrition (HCC) 04/12/2021   Hepatic steatosis 04/12/2021   Stroke (HCC) 04/11/2021   Small bowel obstruction (HCC) 10/13/2020   Anemia due to vitamin B12 deficiency 10/13/2020   Hypertension 01/09/2015   Incisional hernia 01/07/2015      Social History   reports that he quit smoking about 15 months ago. His smoking use included cigarettes. He has never used smokeless tobacco. He reports that he does not currently use alcohol. He reports that he does not currently use drugs after having used the following drugs: Cocaine.   Family History  family history is not on file.   Medications: reviewed and updated   Objective:   Physical Exam BP 121/82 (BP Location: Left Arm, Patient Position: Sitting, Cuff Size: Normal)   Pulse 84   Temp 98.3 F (36.8 C)   Resp 18   Ht 5' 6.5" (1.689 m)   Wt 140 lb (63.5 kg)   SpO2 97%   BMI 22.26 kg/m   Physical Exam HENT:     Head: Normocephalic and atraumatic.  Eyes:     Extraocular Movements: Extraocular movements intact.     Conjunctiva/sclera: Conjunctivae normal.     Pupils: Pupils are equal, round, and reactive to light.  Cardiovascular:     Rate and Rhythm: Normal rate and regular rhythm.     Pulses: Normal pulses.     Heart sounds: Normal heart sounds.  Pulmonary:     Effort: Pulmonary effort is normal.     Breath sounds:  Normal breath sounds.  Musculoskeletal:     Cervical back: Normal range of motion and neck supple.     Comments: RUE decreased range of motion.   Neurological:     General: No focal deficit present.     Mental Status: He is alert and oriented to person, place, and time.  Psychiatric:        Mood and Affect: Mood normal.        Behavior: Behavior normal.        Assessment & Plan:  1. Hospital discharge follow-up - Reviewed hospital course, current medications, ensured proper follow-up in place, and addressed concerns.   2. MSSA bacteremia 3. Abscess in epidural space of cervical spine 4. Coccygeal arthritis [facility]) - Referral to Neurosurgery for further evaluation and management.  - Referral to Infectious Disease for further evaluation and management.  - Ambulatory referral to Infectious Disease - Ambulatory referral to Neurosurgery  5. Pressure injury of sacral region, stage 3 [facility]) - Referral to Wound Care Center for further evaluation and management.  - AMB referral to wound care  center  6. Osteoarthritis of glenohumeral joint, right - Counseled per office policy I do not prescribe Oxycodone. Patient verbalized understanding.  - Continue over-the-counter regimen.  - Referral to Orthopedic Surgery and Physical Therapy for further evaluation and management.  - Ambulatory referral to Orthopedic Surgery - Ambulatory referral to Physical Therapy  7. Acute systolic (congestive) heart failure (HCC) - Continue Spironolactone, Losartan, and Furosemide as prescribed.  - Referral to Cardiology for further evaluation and management. - Keep appointment 09/03/2021 with Orlie Pollen, MD at Vascular Surgery.  - Follow-up with primary provider as scheduled.  - spironolactone (ALDACTONE) 25 MG tablet; Take 1 tablet (25 mg total) by mouth daily.  Dispense: 30 tablet; Refill: 1 - losartan (COZAAR) 25 MG tablet; Take 1 tablet (25 mg total) by mouth daily.  Dispense: 30 tablet; Refill: 1 -  furosemide (LASIX) 40 MG tablet; Take 1 tablet (40 mg total) by mouth daily.  Dispense: 30 tablet; Refill: 1 - Ambulatory referral to Cardiology  8. Transient atrial fibrillation ([place]) 9. Atrial fibrillation with RVR (HCC) - Continue Metoprolol Succinate as prescribed.  - Referral to Cardiology for further evaluation and management.  - Follow-up with primary provider as scheduled.  - metoprolol succinate (TOPROL-XL) 100 MG 24 hr tablet; Take 1 tablet (100 mg total) by mouth daily. Take with or immediately following a meal.  Dispense: 30 tablet; Refill: 1 - Ambulatory referral to Cardiology  10. Severe protein-calorie malnutrition ([place]) - Patient reports back to eating and drinking as normal.   11. Debility 12. Frailty - Referral to Physical Therapy for further evaluation and management.  - Ambulatory referral to Physical Therapy  13. Essential (primary) hypertension - Continue present management.  - Referral to Cardiology for further evaluation and management.  - Ambulatory referral to Cardiology  14. Anemia of chronic disease - Stable prior to hospital discharge.   15. Hypomagnesemia - Replaced prior to hospital discharge.   Patient was given clear instructions to go to Emergency Department or return to medical center if symptoms don't improve, worsen, or new problems develop.The patient verbalized understanding.  I discussed the assessment and treatment plan with the patient. The patient was provided an opportunity to ask questions and all were answered. The patient agreed with the plan and demonstrated an understanding of the instructions.   The patient was advised to call back or seek an in-person evaluation if the symptoms worsen or if the condition fails to improve as anticipated.    Durene Fruits, NP 08/25/2021, 12:32 PM Primary Care at [facility]

## 2021-08-19 ENCOUNTER — Other Ambulatory Visit: Payer: Self-pay | Admitting: *Deleted

## 2021-08-19 DIAGNOSIS — M7989 Other specified soft tissue disorders: Secondary | ICD-10-CM

## 2021-08-25 ENCOUNTER — Telehealth: Payer: Self-pay | Admitting: Family

## 2021-08-25 ENCOUNTER — Ambulatory Visit (INDEPENDENT_AMBULATORY_CARE_PROVIDER_SITE_OTHER): Payer: Medicaid Other | Admitting: Family

## 2021-08-25 VITALS — BP 121/82 | HR 84 | Temp 98.3°F | Resp 18 | Ht 66.5 in | Wt 140.0 lb

## 2021-08-25 DIAGNOSIS — R54 Age-related physical debility: Secondary | ICD-10-CM

## 2021-08-25 DIAGNOSIS — G061 Intraspinal abscess and granuloma: Secondary | ICD-10-CM

## 2021-08-25 DIAGNOSIS — R5381 Other malaise: Secondary | ICD-10-CM | POA: Diagnosis not present

## 2021-08-25 DIAGNOSIS — Z7689 Persons encountering health services in other specified circumstances: Secondary | ICD-10-CM

## 2021-08-25 DIAGNOSIS — I10 Essential (primary) hypertension: Secondary | ICD-10-CM | POA: Diagnosis not present

## 2021-08-25 DIAGNOSIS — L89153 Pressure ulcer of sacral region, stage 3: Secondary | ICD-10-CM | POA: Diagnosis not present

## 2021-08-25 DIAGNOSIS — B9561 Methicillin susceptible Staphylococcus aureus infection as the cause of diseases classified elsewhere: Secondary | ICD-10-CM

## 2021-08-25 DIAGNOSIS — M4698 Unspecified inflammatory spondylopathy, sacral and sacrococcygeal region: Secondary | ICD-10-CM | POA: Diagnosis not present

## 2021-08-25 DIAGNOSIS — Z09 Encounter for follow-up examination after completed treatment for conditions other than malignant neoplasm: Secondary | ICD-10-CM

## 2021-08-25 DIAGNOSIS — I4891 Unspecified atrial fibrillation: Secondary | ICD-10-CM | POA: Diagnosis not present

## 2021-08-25 DIAGNOSIS — D638 Anemia in other chronic diseases classified elsewhere: Secondary | ICD-10-CM

## 2021-08-25 DIAGNOSIS — I5021 Acute systolic (congestive) heart failure: Secondary | ICD-10-CM

## 2021-08-25 DIAGNOSIS — M19011 Primary osteoarthritis, right shoulder: Secondary | ICD-10-CM | POA: Diagnosis not present

## 2021-08-25 DIAGNOSIS — R7881 Bacteremia: Secondary | ICD-10-CM

## 2021-08-25 DIAGNOSIS — E43 Unspecified severe protein-calorie malnutrition: Secondary | ICD-10-CM

## 2021-08-25 MED ORDER — FUROSEMIDE 40 MG PO TABS: 40.0000 mg | ORAL_TABLET | Freq: Every day | ORAL | 1 refills | Status: DC

## 2021-08-25 MED ORDER — METOPROLOL SUCCINATE ER 100 MG PO TB24: 100.0000 mg | ORAL_TABLET | Freq: Every day | ORAL | 1 refills | Status: DC

## 2021-08-25 MED ORDER — SPIRONOLACTONE 25 MG PO TABS: 25.0000 mg | ORAL_TABLET | Freq: Every day | ORAL | 1 refills | Status: DC

## 2021-08-25 MED ORDER — LOSARTAN POTASSIUM 25 MG PO TABS: 25.0000 mg | ORAL_TABLET | Freq: Every day | ORAL | 1 refills | Status: DC

## 2021-08-25 NOTE — Progress Notes (Signed)
.  Pt presents to establish care, and hospital f/u states that he is still experiencing bilateral hand edema

## 2021-08-31 ENCOUNTER — Ambulatory Visit: Payer: Medicaid Other | Admitting: Internal Medicine

## 2021-08-31 NOTE — Progress Notes (Deleted)
[facility]  CHIEF COMPLAINT:    Follow up for MSSA infection  SUBJECTIVE:    Logan Nelson is a 56 y.o. male with PMHx as below who presents to the clinic for MSSA infection.   Patient presents today for hospital follow-up.  He was hospitalized for a prolonged period of time at [facility] from 06/16/2021 to 07/05/2021 when he presented with sepsis due to MSSA bacteremia.  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  He underwent cervical decompression and abscess evacuation with instrumentation on 06/23/2021.  His operative cultures grew MSSA.  For his infected sacral decubitus ulcer with osteomyelitis, he completed an empiric 10-day course of Unasyn prior to being converted to a prolonged course of cefazolin.  He was discharged with OP AT orders for cefazolin 2 g every 8 hours x6 weeks through 08/04/2021.  He was discharged on 5/28 to a skilled nursing facility for short-term rehab.  He had a emergency department visit on 07/10/2021 following hospital discharge for a PICC line issue.  Where the line was not flushing at his SNF however it was working well in the emergency department and he was subsequently discharged.  At that time his sacral wound overall appeared well.  He had a hospital follow-up appointment with Dr Elinor Parkinson that he did not keep on 07/29/2021.  He subsequently completed his IV antibiotics on 08/04/2021.  PICC line was removed.  And he has now been off antibiotics for the past few weeks.  He was seen at a new primary care visit on 7/19 for hospital follow-up to establish care.  He was seen by Delfin Gant, NP.  At that time he was overall improved since hospital discharge.  He did continue to complain of right upper extremity pain and swelling that was present while in the hospital.  He continued to have sacral pain which she was cleansing with soap and water.  His PICC line  had been removed on 08/09/2021.  He had not been to any of his recommended specialist appointments such as ID, neurosurgery, cardiology.  He was subsequently referred at that visit and presents today.  Please see A&P for the details of today's visit and status of the patient's medical problems.   Patient's Medications  New Prescriptions   No medications on file  Previous Medications   ACETAMINOPHEN (TYLENOL) 325 MG TABLET    Take 2 tablets (650 mg total) by mouth every 6 (six) hours as needed for mild pain ((score 1 to 3) or temp > 100.5).   DICLOFENAC SODIUM (VOLTAREN) 1 % GEL    Apply 2 g topically 2 (two) times daily.   FUROSEMIDE (LASIX) 40 MG TABLET    Take 1 tablet (40 mg total) by mouth daily.   LOSARTAN (COZAAR) 25 MG TABLET    Take 1 tablet (25 mg total) by mouth daily.   METOPROLOL SUCCINATE (TOPROL-XL) 100 MG 24 HR TABLET    Take 1 tablet (100 mg total) by mouth daily. Take with or immediately following a meal.   OXYCODONE (OXY IR/ROXICODONE) 5 MG IMMEDIATE RELEASE TABLET    Take 1 tablet (5 mg total) by mouth every 6 (six) hours as needed for moderate pain.   POTASSIUM CHLORIDE SA (KLOR-CON M) 20 MEQ TABLET    Take 1 tablet (20 mEq total) by mouth daily.   SENNA-DOCUSATE (SENOKOT-S) 8.6-50 MG TABLET    Take 1 tablet  by mouth 2 (two) times daily.   SERTRALINE (ZOLOFT) 25 MG TABLET    Take 25 mg by mouth every morning.   SPIRONOLACTONE (ALDACTONE) 25 MG TABLET    Take 1 tablet (25 mg total) by mouth daily.  Modified Medications   No medications on file  Discontinued Medications   No medications on file      Past Medical History:  Diagnosis Date   Anemia    Arthritis    Bradycardia    Dyspnea    Falling episodes    GERD (gastroesophageal reflux disease)    Hernia of abdominal wall    Hypertension    Lower extremity edema     Social History   Tobacco Use   Smoking status: Former    Types: Cigarettes    Quit date: 04/28/2020    Years since quitting: 1.3   Smokeless  tobacco: Never  Vaping Use   Vaping Use: Never used  Substance Use Topics   Alcohol use: Not Currently    Comment: before admission   Drug use: Not Currently    Types: Cocaine    Comment: past history    No family history on file.  No Known Allergies  ROS   OBJECTIVE:    There were no vitals filed for this visit. There is no height or weight on file to calculate BMI.  Physical Exam   Labs and Microbiology:    Latest Ref Rng & Units 07/02/2021    4:22 AM 07/01/2021    4:14 AM 06/30/2021    3:40 AM  CBC  WBC 4.0 - 10.5 K/uL 11.5  10.4  10.1   Hemoglobin 13.0 - 17.0 g/dL 9.5  9.8  8.5   Hematocrit 39.0 - 52.0 % 31.1  32.3  28.2   Platelets 150 - 400 K/uL 351  373  352       Latest Ref Rng & Units 07/04/2021    4:58 AM 07/03/2021    4:58 AM 07/01/2021    4:14 AM  CMP  Glucose 70 - 99 mg/dL 84  419  79   BUN 6 - 20 mg/dL 15  14  14    Creatinine 0.61 - 1.24 mg/dL  6.22  2.97   Sodium 135 - 145 mmol/L 138  137  138   Potassium 3.5 - 5.1 mmol/L 4.6  4.1  4.1   Chloride 98 - 111 mmol/L 105  104  105   CO2 22 - 32 mmol/L 27  28  26    Calcium 8.9 - 10.3 mg/dL 8.9  8.5  8.6        ASSESSMENT & PLAN:    No problem-specific Assessment & Plan notes found for this encounter.   No orders of the defined types were placed in this encounter.    There are no diagnoses linked to this encounter.  Patient has completed a prolonged course of IV antibiotics for his MSSA bacteremia complicated by epidural abscess, discitis/osteomyelitis, and sacral wound.  He underwent cervical decompression and abscess evacuation with instrumentation 06/23/2021 with neurosurgery and completed 6 full weeks of IV therapy from that date.  He was referred to NSGY for follow-up recently when he established care with his primary care and reports he will be seeing them on ***.  He has completed an adequate antibiotic treatment course for his bacteremia and sacral wound.  An argument could be made for  oral suppressive antibiotics given the epidural abscess status post evacuation with instrumentation.  However,  he has now been off antibiotics for over 3 weeks and has no concerning signs or symptoms for relapsed infection.  Will continue to monitor off antibiotics and strict return precautions given.  Follow up for clinical re-evaluation in 6 weeks, sooner if needed.    Vedia Coffer [facility] 08/31/2021, 9:56 AM

## 2021-09-01 ENCOUNTER — Ambulatory Visit: Payer: Medicaid Other | Admitting: Orthopaedic Surgery

## 2021-09-01 ENCOUNTER — Encounter (HOSPITAL_BASED_OUTPATIENT_CLINIC_OR_DEPARTMENT_OTHER): Payer: Medicaid Other | Attending: Physician Assistant | Admitting: Internal Medicine

## 2021-09-01 DIAGNOSIS — M4802 Spinal stenosis, cervical region: Secondary | ICD-10-CM | POA: Insufficient documentation

## 2021-09-01 DIAGNOSIS — L89159 Pressure ulcer of sacral region, unspecified stage: Secondary | ICD-10-CM | POA: Diagnosis present

## 2021-09-01 DIAGNOSIS — I110 Hypertensive heart disease with heart failure: Secondary | ICD-10-CM | POA: Diagnosis not present

## 2021-09-01 DIAGNOSIS — B9561 Methicillin susceptible Staphylococcus aureus infection as the cause of diseases classified elsewhere: Secondary | ICD-10-CM | POA: Insufficient documentation

## 2021-09-01 DIAGNOSIS — I5022 Chronic systolic (congestive) heart failure: Secondary | ICD-10-CM | POA: Insufficient documentation

## 2021-09-01 DIAGNOSIS — L89153 Pressure ulcer of sacral region, stage 3: Secondary | ICD-10-CM | POA: Diagnosis not present

## 2021-09-01 NOTE — Progress Notes (Signed)
Logan, Nelson ([id number]) Visit Report for 09/01/2021 Allergy List Details Patient Name: Date of Service: Logan Nelson 09/01/2021 12:45 PM Medical Record Number: [id number] Patient Account Number: [id number] Date of Birth/Sex: Treating RN: 1965/05/01 (56 y.o. Tammy Sours Primary Care Brenisha Tsui: Thomes Dinning Other Clinician: Referring Danel Requena: Treating Jeanee Fabre/Extender: Douglass Rivers in Treatment: 0 Allergies Active Allergies No Known Drug Allergies Allergy Notes Electronic Signature(s) Signed: 09/01/2021 5:11:09 PM By: Shawn Stall RN, BSN Entered By: Shawn Stall on 08/31/2021 11:20:55 -------------------------------------------------------------------------------- Arrival Information Details Patient Name: Date of Service: Logan Nelson, [name] 09/01/2021 12:45 PM Medical Record Number: [id number] Patient Account Number: [id number] Date of Birth/Sex: Treating RN: 1965-08-02 (56 y.o. Tammy Sours Primary Care Tydus Sanmiguel: Thomes Dinning Other Clinician: Referring Briseidy Spark: Treating Shirelle Tootle/Extender: Douglass Rivers in Treatment: 0 Visit Information Patient Arrived: Ambulatory Arrival Time: 12:54 Accompanied By: self Transfer Assistance: None Patient Identification Verified: Yes Secondary Verification Process Completed: Yes Patient Requires Transmission-Based Precautions: No Patient Has Alerts: No Electronic Signature(s) Signed: 09/01/2021 5:11:09 PM By: Shawn Stall RN, BSN Entered By: Shawn Stall on 09/01/2021 12:58:11 -------------------------------------------------------------------------------- Clinic Level of Care Assessment Details Patient Name: Date of Service: Logan Addison K. 09/01/2021 12:45 PM Medical Record Number: [id number] Patient Account Number: [id number] Date of Birth/Sex: Treating RN: 19-Nov-1965 (56 y.o. Tammy Sours Primary Care Hae Ahlers: Thomes Dinning Other  Clinician: Referring [place] Searles: Treating Kurstyn Larios/Extender: Douglass Rivers in Treatment: 0 Clinic Level of Care Assessment Items TOOL 2 Quantity Score X- 1 0 Use when only an EandM is performed on the INITIAL visit ASSESSMENTS - Nursing Assessment / Reassessment X- 1 20 General Physical Exam (combine w/ comprehensive assessment (listed just below) when performed on new pt. evals) X- 1 25 Comprehensive Assessment (HX, ROS, Risk Assessments, Wounds Hx, etc.) ASSESSMENTS - Wound and Skin A ssessment / Reassessment []  - 0 Simple Wound Assessment / Reassessment - one wound []  - 0 Complex Wound Assessment / Reassessment - multiple wounds X- 1 10 Dermatologic / Skin Assessment (not related to wound area) ASSESSMENTS - Ostomy and/or Continence Assessment and Care []  - 0 Incontinence Assessment and Management []  - 0 Ostomy Care Assessment and Management (repouching, etc.) PROCESS - Coordination of Care X - Simple Patient / Family Education for ongoing care 1 15 []  - 0 Complex (extensive) Patient / Family Education for ongoing care X- 1 10 Staff obtains , Records, T Results / Process Orders est []  - 0 Staff telephones HHA, Nursing Homes / Clarify orders / etc []  - 0 Routine Transfer to another Facility (non-emergent condition) []  - 0 Routine Hospital Admission (non-emergent condition) []  - 0 New Admissions / / Ordering NPWT Apligraf, etc. , []  - 0 Emergency Hospital Admission (emergent condition) X- 1 10 Simple Discharge Coordination []  - 0 Complex (extensive) Discharge Coordination PROCESS - Special Needs []  - 0 Pediatric / Minor Patient Management []  - 0 Isolation Patient Management []  - 0 Hearing / Language / Visual special needs []  - 0 Assessment of Community assistance (transportation, D/C planning, etc.) []  - 0 Additional assistance / Altered mentation []  - 0 Support Surface(s) Assessment (bed, cushion,  seat, etc.) INTERVENTIONS - Wound Cleansing / Measurement []  - 0 Wound Imaging (photographs - any number of wounds) []  - 0 Wound Tracing (instead of photographs) []  - 0 Simple Wound Measurement - one wound []  - 0 Complex Wound Measurement - multiple wounds []  - 0 Simple Wound Cleansing -  one wound []  - 0 Complex Wound Cleansing - multiple wounds INTERVENTIONS - Wound Dressings []  - 0 Small Wound Dressing one or multiple wounds []  - 0 Medium Wound Dressing one or multiple wounds []  - 0 Large Wound Dressing one or multiple wounds []  - 0 Application of Medications - injection INTERVENTIONS - Miscellaneous []  - 0 External ear exam []  - 0 Specimen Collection (cultures, biopsies, blood, body fluids, etc.) []  - 0 Specimen(s) / Culture(s) sent or taken to Lab for analysis []  - 0 Patient Transfer (multiple staff / Lift / Similar devices) []  - 0 Simple Staple / Suture removal (25 or less) []  - 0 Complex Staple / Suture removal (26 or more) []  - 0 Hypo / Hyperglycemic Management (close monitor of Blood Glucose) []  - 0 Ankle / Brachial Index (ABI) - do not check if billed separately Has the patient been seen at the hospital within the last three years: Yes Total Score: 90 Level Of Care: New/Established - Level 3 Electronic Signature(s) Signed: 09/01/2021 5:11:09 PM By: RN, BSN Entered By: on 09/01/2021 13:24:51 -------------------------------------------------------------------------------- Encounter Discharge Information Details Patient Name: Date of Service: , [name] 09/01/2021 12:45 PM Medical Record Number: Patient Account Number: Date of Birth/Sex: Treating RN: 08-Jun-1965 (56 y.o. Primary Care [place] Alfredo: Other Clinician: Referring Emre Stock: Treating Naeema Patlan/Extender: in Treatment: 0 Encounter Discharge Information Items Discharge  Condition: Stable Ambulatory Status: Ambulatory Discharge Destination: Home Transportation: Private Auto Accompanied By: self Schedule Follow-up Appointment: No Clinical Summary of Care: Electronic Signature(s) Signed: 09/01/2021 5:11:09 PM By: Shawn Stall RN, BSN Entered By: Shawn Stall on 09/01/2021 13:25:10 -------------------------------------------------------------------------------- Lower Extremity Assessment Details Patient Name: Date of Service: Logan Nelson K. 09/01/2021 12:45 PM Medical Record Number: [id number] Patient Account Number: [id number] Date of Birth/Sex: Treating RN: 06/21/1965 (56 y.o. Tammy Sours Primary Care Lonnetta Kniskern: Thomes Dinning Other Clinician: Referring Machell Wirthlin: Treating Donoven Pett/Extender: Douglass Rivers in Treatment: 0 Electronic Signature(s) Signed: 09/01/2021 5:11:09 PM By: Shawn Stall RN, BSN Signed: 09/01/2021 5:11:09 PM By: 09/03/2021 RN, BSN Entered By: Logan Addison on 09/01/2021 13:02:50 -------------------------------------------------------------------------------- Multi-Disciplinary Care Plan Details Patient Name: Date of Service: [id number], [name] 09/01/2021 12:45 PM Medical Record Number: 07/09/1965 Patient Account Number: [id number] Date of Birth/Sex: Treating RN: 1965/03/15 (56 y.o. Douglass Rivers Primary Care Mikaela Hilgeman: 09/03/2021 Other Clinician: Referring Shakeeta Godette: Treating Lamin Chandley/Extender: Shawn Stall in Treatment: 0 Active Inactive Electronic Signature(s) Signed: 09/01/2021 5:11:09 PM By: Shawn Stall RN, BSN Entered By: Shawn Stall on 09/01/2021 13:18:06 -------------------------------------------------------------------------------- Pain Assessment Details Patient Name: Date of Service: Logan Nelson, [name] 09/01/2021 12:45 PM Medical Record Number: [id number] Patient Account Number: [id number] Date of Birth/Sex: Treating RN: 1966-01-29 (56 y.o.  Tammy Sours Primary Care Artur Winningham: Thomes Dinning Other Clinician: Referring Phil Corti: Treating Russel Morain/Extender: Douglass Rivers in Treatment: 0 Active Problems Location of Pain Severity and Description of Pain Patient Has Paino Yes Site Locations Rate the pain. Current Pain Level: 9 Pain Management and Medication Current Pain Management: Medication: No Cold Application: No Rest: No Massage: No Activity: No T.E.N.S.: No Heat Application: No Leg drop or elevation: No Is the Current Pain Management Adequate: Adequate How does your wound impact your activities of daily livingo Sleep: No Bathing: No Appetite: No Relationship With Others: No Bladder Continence: No Emotions: No Bowel Continence: No Work: No Toileting:  No Drive: No Dressing: No Hobbies: No Electronic Signature(s) Signed: 09/01/2021 5:11:09 PM By: Shawn Stall RN, BSN Entered By: Shawn Stall on 09/01/2021 13:01:11 -------------------------------------------------------------------------------- Patient/Caregiver Education Details Patient Name: Date of Service: Logan Nelson 7/26/2023andnbsp12:45 PM Medical Record Number: [id number] Patient Account Number: [id number] Date of Birth/Gender: Treating RN: 12-29-1965 (56 y.o. Tammy Sours Primary Care Physician: Thomes Dinning Other Clinician: Referring Physician: Treating Physician/Extender: Douglass Rivers in Treatment: 0 Education Assessment Education Provided To: Patient Education Topics Provided Welcome [facility]: o Handouts: Welcome [facility] o Methods: Explain/Verbal Responses: Reinforcements needed Electronic Signature(s) Signed: 09/01/2021 5:11:09 PM By: Shawn Stall RN, BSN Entered By: Shawn Stall on 09/01/2021 13:18:16 -------------------------------------------------------------------------------- Vitals Details Patient Name: Date of  Service: Logan Nelson, [name] 09/01/2021 12:45 PM Medical Record Number: [id number] Patient Account Number: [id number] Date of Birth/Sex: Treating RN: [birth year] (56 y.o. Tammy Sours Primary Care Bali Lyn: Thomes Dinning Other Clinician: Referring Colsen Modi: Treating Darcell Sabino/Extender: Douglass Rivers in Treatment: 0 Vital Signs Time Taken: 12:58 Temperature (F): 97.8 Height (in): 66 Pulse (bpm): 51 Source: Stated Respiratory Rate (breaths/min): 20 Weight (lbs): 170 Blood Pressure (mmHg): 131/72 Source: Stated Reference Range: 80 - 120 mg / dl Body Mass Index (BMI): 27.4 Electronic Signature(s) Signed: 09/01/2021 5:11:09 PM By: Shawn Stall RN, BSN Entered By: Shawn Stall on 09/01/2021 12:59:16

## 2021-09-01 NOTE — Progress Notes (Signed)
KHANI, PAINO ([id number]) Visit Report for 09/01/2021 Chief Complaint Document Details Patient Name: Date of Service: Logan Nelson 09/01/2021 12:45 PM Medical Record Number: [id number] Patient Account Number: [id number] Date of Birth/Sex: Treating RN: 1965-11-18 (55 y.o. Logan Nelson Primary Care Provider: Thomes Nelson Other Clinician: Referring Provider: Treating Provider/Extender: Logan Nelson in Treatment: 0 Information Obtained from: Patient Chief Complaint 09/01/2021; patient is here for review of a sacral pressure ulcer Electronic Signature(s) Signed: 09/01/2021 4:38:07 PM By: Logan Najjar MD Entered By: Logan Nelson on 09/01/2021 13:52:11 -------------------------------------------------------------------------------- HPI Details Patient Name: Date of Service: Logan Nelson, Logan RRYL K. 09/01/2021 12:45 PM Medical Record Number: [id number] Patient Account Number: [id number] Date of Birth/Sex: Treating RN: February 19, 1965 (55 y.o. Logan Nelson Primary Care Provider: Thomes Nelson Other Clinician: Referring Provider: Treating Provider/Extender: Logan Nelson in Treatment: 0 History of Present Illness HPI Description: ADMISSION 09/01/2021 This is an unfortunate 56 year old man who was hospitalized from 06/16/2021 through 07/05/2021 with an abscess at the site of a previous cervical decompression and MSSA sepsis. He has completed his 6 weeks of Ancef. During the course of the hospitalization and rehabilitation he was noted to have a stage II wound on his buttock and then a stage III wound on the sacrum. I do not have a lot of information on this however I believe he is here for review of this. The patient for his own part is complaining of severe pain in the lower end of the coccyx. I note that he had a MRI done on 06/18/2021 of the lumbar spine. This showed marrow edema throughout the entire first through fourth coccygeal  segments with cortical erosion of the third and fourth segments including osteomyelitis. He also had surrounding edema throughout the gluteus musculature. This finding would be concerning for continued osteomyelitis/chronic osteomyelitis in this area given his continued complaints of pain ALSO the patient asked me to look at his right shoulder he is supposed to see Dr. Roda Nelson of orthopedics although his appointment was delayed till later this week. He is having trouble moving it. He had a CT scan of the shoulder it was not suggestive of a septic arthritis. He gave me a vague history of falling His Outstretched Arm While in Rehabilitation. He Is Not Complaining of Fever or Chills His Only Real Complaint Is Pain in His Lower Back That Makes Him Difficult to Sleep. Past Medical History heart failure with reduced ejection fraction, cervical spine epidural abscess which I believe was postop. His primary doctor in the hospital discharge follow-up suggested he had an infected sacral decubitus ulcer completing 10 days of Unasyn and continuing on Ancef. He does not mention what his local wound care was Electronic Signature(s) Signed: 09/01/2021 4:38:07 PM By: Logan Najjar MD Entered By: Logan Nelson on 09/01/2021 14:00:29 -------------------------------------------------------------------------------- Physical Exam Details Patient Name: Date of Service: Logan Nelson, Logan RRYL K. 09/01/2021 12:45 PM Medical Record Number: [id number] Patient Account Number: [id number] Date of Birth/Sex: Treating RN: March 10, 1965 (56 y.o. Logan Nelson Primary Care Provider: Thomes Nelson Other Clinician: Referring Provider: Treating Provider/Extender: Logan Nelson in Treatment: 0 Constitutional Sitting or standing Blood Pressure is within target range for patient.. Pulse regular and within target range for patient.Marland Kitchen Respirations regular, non-labored and within target range.. Temperature is normal  and within the target range for the patient.Marland Kitchen Appears in no distress. Musculoskeletal He appears to have a right rotator cuff tear. There is no effusion  in the right shoulder. Notes Wound exam; his sacrum extending right into his coccyx were carefully inspected. There is no open wound here nor is there a wound on his left buttock which looking at his records was the other issue. There is no obvious ballotable tenderness although he does appear to be tender in the lower part of his coccyx area. Electronic Signature(s) Signed: 09/01/2021 4:38:07 PM By: Logan Najjar MD Entered By: Logan Nelson on 09/01/2021 14:01:44 -------------------------------------------------------------------------------- Physician Orders Details Patient Name: Date of Service: Logan Nelson, Logan RRYL K. 09/01/2021 12:45 PM Medical Record Number: [id number] Patient Account Number: [id number] Date of Birth/Sex: Treating RN: [birth year] (56 y.o. Logan Nelson Primary Care Provider: Thomes Nelson Other Clinician: Referring Provider: Treating Provider/Extender: Logan Nelson in Treatment: 0 Verbal / Phone Orders: No Diagnosis Coding Discharge From [facility]charge from [facility] - Call if any future wound care needs. Follow up with your primary care provider, orthopedics about right shoulder, and pain in the sacral region. Electronic Signature(s) Signed: 09/01/2021 4:38:07 PM By: Logan Najjar MD Signed: 09/01/2021 5:11:09 PM By: Logan Stall RN, BSN Entered By: Logan Nelson on 09/01/2021 13:23:22 -------------------------------------------------------------------------------- Problem List Details Patient Name: Date of Service: Logan Nelson, Logan RRYL K. 09/01/2021 12:45 PM Medical Record Number: [id number] Patient Account Number: [id number] Date of Birth/Sex: Treating RN: 07-14-1965 (56 y.o. Logan Nelson Primary Care Provider: Thomes Nelson Other Clinician: Referring  Provider: Treating Provider/Extender: Logan Nelson in Treatment: 0 Active Problems ICD-10 Encounter Code Description Active Date MDM Diagnosis L89.159 Pressure ulcer of sacral region, unspecified stage 09/01/2021 No Yes M48.02 Spinal stenosis, cervical region 09/01/2021 No Yes B95.61 Methicillin susceptible Staphylococcus aureus infection as the cause of 09/01/2021 No Yes diseases classified elsewhere M75.100 Unspecified rotator cuff tear or rupture of unspecified shoulder, not specified 09/01/2021 No Yes as traumatic Inactive Problems Resolved Problems Electronic Signature(s) Signed: 09/01/2021 4:38:07 PM By: Logan Najjar MD Entered By: Logan Nelson on 09/01/2021 13:49:50 -------------------------------------------------------------------------------- Progress Note Details Patient Name: Date of Service: Logan Nelson, Logan RRYL K. 09/01/2021 12:45 PM Medical Record Number: [id number] Patient Account Number: [id number] Date of Birth/Sex: Treating RN: 14-Jun-1965 (56 y.o. Logan Nelson Primary Care Provider: Thomes Nelson Other Clinician: Referring Provider: Treating Provider/Extender: Logan Nelson in Treatment: 0 Subjective Chief Complaint Information obtained from Patient 09/01/2021; patient is here for review of a sacral pressure ulcer History of Present Illness (HPI) ADMISSION 09/01/2021 This is an unfortunate 56 year old man who was hospitalized from 06/16/2021 through 07/05/2021 with an abscess at the site of a previous cervical decompression and MSSA sepsis. He has completed his 6 weeks of Ancef. During the course of the hospitalization and rehabilitation he was noted to have a stage II wound on his buttock and then a stage III wound on the sacrum. I do not have a lot of information on this however I believe he is here for review of this. The patient for his own part is complaining of severe pain in the lower end of the coccyx. I  note that he had a MRI done on 06/18/2021 of the lumbar spine. This showed marrow edema throughout the entire first through fourth coccygeal segments with cortical erosion of the third and fourth segments including osteomyelitis. He also had surrounding edema throughout the gluteus musculature. This finding would be concerning for continued osteomyelitis/chronic osteomyelitis in this area given his continued complaints of pain ALSO the patient asked me to look at his right  shoulder he is supposed to see Dr. Erlinda Hong of orthopedics although his appointment was delayed till later this week. He is having trouble moving it. He had a CT scan of the shoulder it was not suggestive of a septic arthritis. He gave me a vague history of falling His Outstretched Arm While in Rehabilitation. He Is Not Complaining of Fever or Chills His Only Real Complaint Is Pain in His Lower Back That Makes Him Difficult to Sleep. Past Medical History heart failure with reduced ejection fraction, cervical spine epidural abscess which I believe was postop. His primary doctor in the hospital discharge follow-up suggested he had an infected sacral decubitus ulcer completing 10 days of Unasyn and continuing on Ancef. He does not mention what his local wound care was Patient History Information obtained from Patient, Chart. Allergies No Known Drug Allergies Social History Former smoker - quit 04/2020. Medical History Hematologic/Lymphatic Patient has history of Anemia Cardiovascular Patient has history of Hypertension Musculoskeletal Patient has history of Osteoarthritis Hospitalization/Surgery History - 06/2021 cervical fusion. - 0000000 umbilical/ingunial hernia repair. - 06/2021 abscess epidural space of cervical spine. Medical A Surgical History Notes nd Constitutional Symptoms (General Health) protein-calorie malnutrition Cardiovascular bradycardia lower leg edema Gastrointestinal GERD hernia abdominal wall SBO hepatic  steatosis Objective Constitutional Sitting or standing Blood Pressure is within target range for patient.. Pulse regular and within target range for patient.Marland Kitchen Respirations regular, non-labored and within target range.. Temperature is normal and within the target range for the patient.Marland Kitchen Appears in no distress. Vitals Time Taken: 12:58 PM, Height: 66 in, Source: Stated, Weight: 170 lbs, Source: Stated, BMI: 27.4, Temperature: 97.8 F, Pulse: 51 bpm, Respiratory Rate: 20 breaths/min, Blood Pressure: 131/72 mmHg. Musculoskeletal He appears to have a right rotator cuff tear. There is no effusion in the right shoulder. General Notes: Wound exam; his sacrum extending right into his coccyx were carefully inspected. There is no open wound here nor is there a wound on his left buttock which looking at his records was the other issue. There is no obvious ballotable tenderness although he does appear to be tender in the lower part of his coccyx area. Assessment Active Problems ICD-10 Pressure ulcer of sacral region, unspecified stage Spinal stenosis, cervical region Methicillin susceptible Staphylococcus aureus infection as the cause of diseases classified elsewhere Unspecified rotator cuff tear or rupture of unspecified shoulder, not specified as traumatic Plan Discharge From [facility]: Discharge from [facility] - Call if any future wound care needs. Follow up with your primary care provider, orthopedics about right shoulder, and pain in the sacral region. 1. MSSA bacteremia secondary to cervical spine abscess postoperative decompression. He has received and finished antibiotics. He follows with infectious disease. I am concerned about the degree of pain in the lower coccyx he describes and I wonder whether he is going to require further imaging of this area. No doubt his cervical spine will also require further imaging 2. I suspect he has a right rotator cuff tear probably related to a  fall in rehab not exactly sure of the timeframe I asked him to mention this at his next appointment. With orthopedics. I did not detect any obvious swelling or suggestion of effusion in the shoulder 3. He does not need to follow in our clinic he does not have any open wounds but I am concerned about the area under the wounded area on his coccyx Electronic Signature(s) Signed: 09/01/2021 4:38:07 PM By: Linton Ham MD Entered By: Linton Ham on 09/01/2021 14:03:31 --------------------------------------------------------------------------------  HxROS Details Patient Name: Date of Service: Logan Nelson 09/01/2021 12:45 PM Medical Record Number: [id number] Patient Account Number: [id number] Date of Birth/Sex: Treating RN: May 22, 1965 (56 y.o. Hessie Diener Primary Care Provider: Marny Lowenstein Other Clinician: Referring Provider: Treating Provider/Extender: Abel Presto in Treatment: 0 Information Obtained From Patient Chart Constitutional Symptoms (General Health) Medical History: Past Medical History Notes: protein-calorie malnutrition Hematologic/Lymphatic Medical History: Positive for: Anemia Cardiovascular Medical History: Positive for: Hypertension Past Medical History Notes: bradycardia lower leg edema Gastrointestinal Medical History: Past Medical History Notes: GERD hernia abdominal wall SBO hepatic steatosis Musculoskeletal Medical History: Positive for: Osteoarthritis Immunizations Pneumococcal Vaccine: Received Pneumococcal Vaccination: Yes Received Pneumococcal Vaccination On or After 60th Birthday: Yes Implantable Devices No devices added Hospitalization / Surgery History Type of Hospitalization/Surgery 06/2021 cervical fusion 0000000 umbilical/ingunial hernia repair 06/2021 abscess epidural space of cervical spine Family and Social History Former smoker - quit 04/2020; Financial Concerns: No; Food, Clothing or  Shelter Needs: No; Support System Lacking: No; Transportation Concerns: No Engineer, maintenance) Signed: 09/01/2021 4:38:07 PM By: Linton Ham MD Signed: 09/01/2021 5:11:09 PM By: Deon Pilling RN, BSN Entered By: Deon Pilling on 08/31/2021 11:27:06 -------------------------------------------------------------------------------- SuperBill Details Patient Name: Date of Service: Logan Nelson, Logan RRYL K. 09/01/2021 Medical Record Number: [id number] Patient Account Number: [id number] Date of Birth/Sex: Treating RN: 1965/12/31 (56 y.o. Hessie Diener Primary Care Provider: Marny Lowenstein Other Clinician: Referring Provider: Treating Provider/Extender: Abel Presto in Treatment: 0 Diagnosis Coding ICD-10 Codes Code Description L89.159 Pressure ulcer of sacral region, unspecified stage M48.02 Spinal stenosis, cervical region B95.61 Methicillin susceptible Staphylococcus aureus infection as the cause of diseases classified elsewhere M75.100 Unspecified rotator cuff tear or rupture of unspecified shoulder, not specified as traumatic Facility Procedures CPT4 Code: [id number] Description: 99213 - WOUND CARE VISIT-LEV 3 EST PT Modifier: Quantity: 1 Physician Procedures : CPT4 Code Description Modifier [id number] WC PHYS LEVEL 3 NEW PT ICD-10 Diagnosis Description L89.159 Pressure ulcer of sacral region, unspecified stage M48.02 Spinal stenosis, cervical region B95.61 Methicillin susceptible Staphylococcus aureus infection  as the cause of diseases classified elsewh M75.100 Unspecified rotator cuff tear or rupture of unspecified shoulder, not specified as traumatic Quantity: 1 ere Electronic Signature(s) Signed: 09/01/2021 4:38:07 PM By: Linton Ham MD Entered By: Linton Ham on 09/01/2021 14:03:59

## 2021-09-01 NOTE — Progress Notes (Signed)
Office Note     CC:  Right hand swelling  Requesting Provider:  [name], MD  HPI: Logan Nelson is a 56 y.o. (1965/08/29) male presenting at the request of .[name], MD for right upper extremity swelling, pain.  Patient has an extensive surgical history of the last 6 months including anterior cervical decompression, arthrodesis with allograft, also underwent open incarcerated incisional hernia repair.  After his anterior cervical decompression, Logan Nelson noted pain in the right upper extremity, pain in the shoulder radiating down to the hand, numbness in the hand.  He presents today to ensure there is not a vascular etiology.  On exam, Logan Nelson was doing well, major complaints included right upper extremity pain, weakness, numbness in the hand, inability to use.  Past Medical History:  Diagnosis Date   Anemia    Arthritis    Bradycardia    Dyspnea    Falling episodes    GERD (gastroesophageal reflux disease)    Hernia of abdominal wall    Hypertension    Lower extremity edema     Past Surgical History:  Procedure Laterality Date   ABDOMINAL SURGERY     ANTERIOR CERVICAL DECOMP/DISCECTOMY FUSION N/A 06/23/2021   Procedure: Anterior cervical Decompression and epidural Abcess evacuation with arthodesis cervical Four -five;  Surgeon: Coletta Memos, MD;  Location: [facility] OR;  Service: Neurosurgery;  Laterality: N/A;   INGUINAL HERNIA REPAIR Right 04/30/2021   Procedure: HERNIA REPAIR INGUINAL WITH MESH;  Surgeon: Stechschulte, Hyman Hopes, MD;  Location: [facility] OR;  Service: General;  Laterality: Right;   LYSIS OF ADHESION  04/30/2021   Procedure: LYSIS OF ADHESION;  Surgeon: Quentin Ore, MD;  Location: [facility] OR;  Service: General;;   RADIOLOGY WITH ANESTHESIA N/A 06/16/2021   Procedure: MRI CERVICAL THORACIC LUMBER AND SACRUM;  Surgeon: Radiologist, Medication, MD;  Location: [facility] OR;  Service: Radiology;  Laterality: N/A;   RADIOLOGY WITH ANESTHESIA N/A 06/18/2021    Procedure: MRI WITH ANESTHESIA;  Surgeon: Radiologist, Medication, MD;  Location: [facility] OR;  Service: Radiology;  Laterality: N/A;   RADIOLOGY WITH ANESTHESIA N/A 06/20/2021   Procedure: MRI WITH ANESTHESIA;  Surgeon: Radiologist, Medication, MD;  Location: [facility] OR;  Service: Radiology;  Laterality: N/A;   UMBILICAL HERNIA REPAIR N/A 04/30/2021   Procedure: OPEN UMBILICAL AND EPIGASTRIC HERNIA REPAIR;  Surgeon: Quentin Ore, MD;  Location: [facility] OR;  Service: General;  Laterality: N/A;    Social History   Socioeconomic History   Marital status: Single    Spouse name: Not on file   Number of children: 1   Years of education: Not on file   Highest education level: High school graduate  Occupational History    Comment: pending disability  Tobacco Use   Smoking status: Former    Types: Cigarettes    Quit date: 04/28/2020    Years since quitting: 1.3   Smokeless tobacco: Never  Vaping Use   Vaping Use: Never used  Substance and Sexual Activity   Alcohol use: Not Currently    Comment: before admission   Drug use: Not Currently    Types: Cocaine    Comment: past history   Sexual activity: Not Currently  Other Topics Concern   Not on file  Social History Narrative   ** Merged History Encounter **       Social Determinants of Health   Financial Resource Strain: Not on file  Food Insecurity: No Food Insecurity (07/02/2021)   Hunger Vital Sign  Worried About Programme researcher, broadcasting/film/video in the Last Year: Never true    Ran Out of Food in the Last Year: Never true  Transportation Needs: No Transportation Needs (07/02/2021)   PRAPARE - Administrator, Civil Service (Medical): No    Lack of Transportation (Non-Medical): No  Physical Activity: Not on file  Stress: Not on file  Social Connections: Not on file  Intimate Partner Violence: Not on file   No family history on file.  Current Outpatient Medications  Medication Sig Dispense Refill   acetaminophen (TYLENOL) 325 MG  tablet Take 2 tablets (650 mg total) by mouth every 6 (six) hours as needed for mild pain ((score 1 to 3) or temp > 100.5).     diclofenac Sodium (VOLTAREN) 1 % GEL Apply 2 g topically 2 (two) times daily.     furosemide (LASIX) 40 MG tablet Take 1 tablet (40 mg total) by mouth daily. 30 tablet 1   losartan (COZAAR) 25 MG tablet Take 1 tablet (25 mg total) by mouth daily. 30 tablet 1   metoprolol succinate (TOPROL-XL) 100 MG 24 hr tablet Take 1 tablet (100 mg total) by mouth daily. Take with or immediately following a meal. 30 tablet 1   oxyCODONE (OXY IR/ROXICODONE) 5 MG immediate release tablet Take 1 tablet (5 mg total) by mouth every 6 (six) hours as needed for moderate pain. 20 tablet 0   potassium chloride SA (KLOR-CON M) 20 MEQ tablet Take 1 tablet (20 mEq total) by mouth daily.     senna-docusate (SENOKOT-S) 8.6-50 MG tablet Take 1 tablet by mouth 2 (two) times daily.     sertraline (ZOLOFT) 25 MG tablet Take 25 mg by mouth every morning.     spironolactone (ALDACTONE) 25 MG tablet Take 1 tablet (25 mg total) by mouth daily. 30 tablet 1   No current facility-administered medications for this visit.    No Known Allergies   REVIEW OF SYSTEMS:   [X]  denotes positive finding, [ ]  denotes negative finding Cardiac  Comments:  Chest pain or chest pressure:    Shortness of breath upon exertion:    Short of breath when lying flat:    Irregular heart rhythm:        Vascular    Pain in calf, thigh, or hip brought on by ambulation:    Pain in feet at night that wakes you up from your sleep:     Blood clot in your veins:    Leg swelling:         Pulmonary    Oxygen at home:    Productive cough:     Wheezing:         Neurologic    Sudden weakness in arms or legs:     Sudden numbness in arms or legs:     Sudden onset of difficulty speaking or slurred speech:    Temporary loss of vision in one eye:     Problems with dizziness:         Gastrointestinal    Blood in stool:      Vomited blood:         Genitourinary    Burning when urinating:     Blood in urine:        Psychiatric    Major depression:         Hematologic    Bleeding problems:    Problems with blood clotting too easily:        Skin  Rashes or ulcers:        Constitutional    Fever or chills:      PHYSICAL EXAMINATION:  There were no vitals filed for this visit.  General:  WDWN in NAD; vital signs documented above Gait: Not observed HENT: WNL, normocephalic Pulmonary: normal non-labored breathing , without wheezing Cardiac: regular HR,  Abdomen: soft, NT, no masses Skin: without rashes Vascular Exam/Pulses:  Right Left  Radial 2+ (normal) 2+ (normal)  Ulnar 2+ (normal) 2+ (normal)  Femoral    Popliteal             Extremities: without ischemic changes, without Gangrene , without cellulitis; without open wounds;  Edema in the right hand and arm Musculoskeletal: no muscle wasting or atrophy  Neurologic: A&O X 3;  No focal weakness or paresthesias are detected Psychiatric:  The pt has Normal affect.   Non-Invasive Vascular Imaging:   Previous right upper extremity duplex ultrasound reviewed demonstrating no DVT Right upper extremity segmental pressures demonstrate normal perfusion to the hand    ASSESSMENT/PLAN: Ousman Yoshio Seliga is a 56 y.o. male presenting with significant deficits in the right upper extremity status post recent anterior cervical decompression for epidural abscess.  Patient does not have a DVT noted on imaging, nor does he have vascular insufficiency.  On physical exam, he had a palpable pulse in the hand.  The edema is likely due to immobility, and being in the dependent position.  He does not have a vascular etiology for his right upper extremity symptoms. He has follow up with his Dr. Franky Macho in the coming weeks.    Victorino Sparrow, MD [facility] [phone]

## 2021-09-01 NOTE — Progress Notes (Signed)
Logan Nelson, Logan Nelson ([id number]) Visit Report for 09/01/2021 Abuse Risk Screen Details Patient Name: Date of Service: Logan Nelson 09/01/2021 12:45 PM Medical Record Number: [id number] Patient Account Number: [id number] Date of Birth/Sex: Treating RN: 1965-02-10 (56 y.o. Tammy Sours Primary Care Kirsten Mckone: Thomes Dinning Other Clinician: Referring Eureka Valdes: Treating Macyn Remmert/Extender: Douglass Rivers in Treatment: 0 Abuse Risk Screen Items Answer ABUSE RISK SCREEN: Has anyone close to you tried to hurt or harm you recentlyo No Do you feel uncomfortable with anyone in your familyo No Has anyone forced you do things that you didnt want to doo No Electronic Signature(s) Signed: 09/01/2021 5:11:09 PM By: Shawn Stall RN, BSN Entered By: Shawn Stall on 09/01/2021 12:59:37 -------------------------------------------------------------------------------- Activities of Daily Living Details Patient Name: Date of Service: Logan Nelson 09/01/2021 12:45 PM Medical Record Number: [id number] Patient Account Number: [id number] Date of Birth/Sex: Treating RN: 02-03-1966 (56 y.o. Tammy Sours Primary Care Kaydince Towles: Thomes Dinning Other Clinician: Referring Kien Mirsky: Treating Draysen Weygandt/Extender: Douglass Rivers in Treatment: 0 Activities of Daily Living Items Answer Activities of Daily Living (Please select one for each item) Drive Automobile Not Able T Medications ake Completely Able Use T elephone Completely Able Care for Appearance Completely Able Use T oilet Completely Able Bath / Shower Completely Able Dress Self Completely Able Feed Self Completely Able Walk Completely Able Get In / Out Bed Completely Able Housework Completely Able Prepare Meals Completely Able Handle Money Completely Able Shop for Self Completely Able Electronic Signature(s) Signed: 09/01/2021 5:11:09 PM By: Shawn Stall RN, BSN Entered By: Shawn Stall on 09/01/2021 12:59:53 -------------------------------------------------------------------------------- Education Screening Details Patient Name: Date of Service: Logan Nelson, Logan RRYL K. 09/01/2021 12:45 PM Medical Record Number: [id number] Patient Account Number: [id number] Date of Birth/Sex: Treating RN: 19-Sep-1965 (56 y.o. Tammy Sours Primary Care Daleisa Halperin: Thomes Dinning Other Clinician: Referring Nusaiba Guallpa: Treating Nunzio Banet/Extender: Douglass Rivers in Treatment: 0 Primary Learner Assessed: Patient Learning Preferences/Education Level/Primary Language Learning Preference: Explanation, Demonstration, Printed Material Highest Education Level: College or Above Preferred Language: English Cognitive Barrier Language Barrier: No Translator Needed: No Memory Deficit: No Emotional Barrier: No Cultural/Religious Beliefs Affecting Medical Care: No Physical Barrier Impaired Vision: No Impaired Hearing: No Decreased Hand dexterity: Yes Limitations: right arm mobility needs shoulder sx per patient. Knowledge/Comprehension Knowledge Level: High Comprehension Level: High Ability to understand written instructions: High Ability to understand verbal instructions: High Motivation Anxiety Level: Calm Cooperation: Cooperative Education Importance: Acknowledges Need Interest in Health Problems: Asks Questions Perception: Coherent Willingness to Engage in Self-Management High Activities: Readiness to Engage in Self-Management High Activities: Electronic Signature(s) Signed: 09/01/2021 5:11:09 PM By: Shawn Stall RN, BSN Entered By: Shawn Stall on 09/01/2021 13:00:28 -------------------------------------------------------------------------------- Fall Risk Assessment Details Patient Name: Date of Service: Logan Nelson, Logan RRYL K. 09/01/2021 12:45 PM Medical Record Number: [id number] Patient Account Number: [id number] Date of Birth/Sex: Treating  RN: 03-03-1965 (56 y.o. Tammy Sours Primary Care Devarius Nelles: Thomes Dinning Other Clinician: Referring Kaegan Hettich: Treating Cortlandt Capuano/Extender: Douglass Rivers in Treatment: 0 Fall Risk Assessment Items Have you had 2 or more falls in the last 12 monthso 0 Yes Have you had any fall that resulted in injury in the last 12 monthso 0 Yes FALLS RISK SCREEN History of falling - immediate or within 3 months 0 No Secondary diagnosis (Do you have 2 or more medical diagnoseso) 0 No Ambulatory aid None/bed rest/wheelchair/nurse 0 Yes Crutches/cane/walker 0 No Furniture 0  No Intravenous therapy Access/Saline/Heparin Lock 0 No Gait/Transferring Normal/ bed rest/ wheelchair 0 Yes Weak (short steps with or without shuffle, stooped but able to lift head while walking, may seek 0 No support from furniture) Impaired (short steps with shuffle, may have difficulty arising from chair, head down, impaired 0 No balance) Mental Status Oriented to own ability 0 Yes Electronic Signature(s) Signed: 09/01/2021 5:11:09 PM By: Shawn Stall RN, BSN Entered By: Shawn Stall on 09/01/2021 13:00:46 -------------------------------------------------------------------------------- Foot Assessment Details Patient Name: Date of Service: Logan Nelson, Logan RRYL K. 09/01/2021 12:45 PM Medical Record Number: [id number] Patient Account Number: [id number] Date of Birth/Sex: Treating RN: [birth year] (57 y.o. Tammy Sours Primary Care Esaias Cleavenger: Thomes Dinning Other Clinician: Referring Mariena Meares: Treating Esmay Amspacher/Extender: Douglass Rivers in Treatment: 0 Foot Assessment Items Site Locations + = Sensation present, - = Sensation absent, C = Callus, U = Ulcer R = Redness, W = Warmth, M = Maceration, PU = Pre-ulcerative lesion F = Fissure, S = Swelling, D = Dryness Assessment Right: Left: Other Deformity: No No Prior Foot Ulcer: No No Prior Amputation: No No Charcot Joint: No  No Ambulatory Status: Gait: Notes No BLE wounds. Electronic Signature(s) Signed: 09/01/2021 5:11:09 PM By: Shawn Stall RN, BSN Entered By: Shawn Stall on 09/01/2021 13:02:42 -------------------------------------------------------------------------------- Nutrition Risk Screening Details Patient Name: Date of Service: Logan Nelson 09/01/2021 12:45 PM Medical Record Number: [id number] Patient Account Number: [id number] Date of Birth/Sex: Treating RN: [birth year] (56 y.o. Tammy Sours Primary Care Tatijana Bierly: Thomes Dinning Other Clinician: Referring Katriona Schmierer: Treating Walter Grima/Extender: Douglass Rivers in Treatment: 0 Height (in): 66 Weight (lbs): 170 Body Mass Index (BMI): 27.4 Nutrition Risk Screening Items Score Screening NUTRITION RISK SCREEN: I have an illness or condition that made me change the kind and/or amount of food I eat 2 Yes I eat fewer than two meals per day 0 No I eat few fruits and vegetables, or milk products 0 No I have three or more drinks of beer, liquor or wine almost every day 0 No I have tooth or mouth problems that make it hard for me to eat 0 No I don't always have enough money to buy the food I need 0 No I eat alone most of the time 0 No I take three or more different prescribed or over-the-counter drugs a day 1 Yes Without wanting to, I have lost or gained 10 pounds in the last six months 0 No I am not always physically able to shop, cook and/or feed myself 0 No Nutrition Protocols Good Risk Protocol Moderate Risk Protocol 0 Provide education on nutrition High Risk Proctocol Risk Level: Moderate Risk Score: 3 Electronic Signature(s) Signed: 09/01/2021 5:11:09 PM By: Shawn Stall RN, BSN Entered By: Shawn Stall on 09/01/2021 13:00:58

## 2021-09-03 ENCOUNTER — Ambulatory Visit (INDEPENDENT_AMBULATORY_CARE_PROVIDER_SITE_OTHER): Payer: Medicaid Other | Admitting: Vascular Surgery

## 2021-09-03 ENCOUNTER — Encounter: Payer: Self-pay | Admitting: Vascular Surgery

## 2021-09-03 ENCOUNTER — Ambulatory Visit (HOSPITAL_COMMUNITY)
Admission: RE | Admit: 2021-09-03 | Discharge: 2021-09-03 | Disposition: A | Discharge status: Home / Self Care | Payer: Medicaid Other | Source: Ambulatory Visit | Attending: Vascular Surgery | Admitting: Vascular Surgery

## 2021-09-03 VITALS — BP 124/80 | HR 55 | Temp 98.1°F | Resp 20 | Ht 66.5 in | Wt 172.0 lb

## 2021-09-03 DIAGNOSIS — M7989 Other specified soft tissue disorders: Secondary | ICD-10-CM | POA: Diagnosis not present

## 2021-09-06 DIAGNOSIS — Z20822 Contact with and (suspected) exposure to covid-19: Secondary | ICD-10-CM | POA: Diagnosis not present

## 2021-09-06 NOTE — Progress Notes (Unsigned)
Cardiology Office Note    Date:  09/08/2021   ID:  Logan Nelson, DOB Sep 13, 1965, MRN [id number]   PCP:  Rema Fendt, NP   [facility]  Cardiologist:  None   Advanced Practice Provider:  No care team member to display Electrophysiologist:  None   [id number]   No chief complaint on file.   History of Present Illness:  Logan Nelson is a 56 y.o. male who saw Dr. Lynnette Caffey 12/2020 with sinus bradycardia chest pain and dyspnea. Dobutamine stress unremarkable.  with acute systolic heart failure atrial fibrillation/flutter with rapid ventricular response polysubstance use cocaine, smoking, with MSSA bacteremia followed by infectious disease with recent cervical spine surgery debridement  Hospitalized 04/2021 for incarcerated abdominal hernia, had primary repair but left AMA before he could be placed in rehab -Admitted 4/3 with severe debility, protein calorie malnutrition and failure to thrive, Hospital course complicated by sepsis, MSSA bacteremia  ID following.  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   Patient comes in for f/u. Only taking 3 meds and doesn't know which ones. Asking for anxiety meds. Pharmacy says his meds are there to pick up. Says he has shortness of breath when he is confined and becomes anxious and heart races. Currently living with his aunt but has to  move.says he may check himself back into rehab. Says he's Not doing cocaine or smoking cigarettes but has a partially smoked cigarette in his hat.   Past Medical History:  Diagnosis Date   Anemia    Arthritis    Bradycardia    Dyspnea    Falling episodes    GERD (gastroesophageal reflux disease)    Hernia of abdominal wall    Hypertension    Lower extremity edema     Past Surgical History:  Procedure Laterality Date   ABDOMINAL SURGERY     ANTERIOR CERVICAL DECOMP/DISCECTOMY FUSION N/A 06/23/2021   Procedure: Anterior cervical Decompression and epidural Abcess evacuation with arthodesis cervical Four -five;  Surgeon: Coletta Memos, MD;  [facility];  Service: Neurosurgery;  Laterality: N/A;   INGUINAL HERNIA REPAIR Right 04/30/2021   Procedure: HERNIA REPAIR INGUINAL WITH MESH;  Surgeon: Stechschulte, Hyman Hopes, MD;  Location: [facility] OR;  Service: General;  Laterality: Right;   LYSIS OF ADHESION  04/30/2021   Procedure: LYSIS OF ADHESION;  Surgeon: Quentin Ore, MD;  Location: [facility] OR;  Service: General;;   RADIOLOGY WITH ANESTHESIA N/A 06/16/2021   Procedure: MRI CERVICAL THORACIC LUMBER AND SACRUM;  Surgeon: Radiologist, Medication, MD;  Location: [facility] OR;  Service: Radiology;  Laterality: N/A;   RADIOLOGY WITH ANESTHESIA N/A 06/18/2021   Procedure: MRI WITH ANESTHESIA;  Surgeon: Radiologist, Medication, MD;  Location: [facility] OR;  Service: Radiology;  Laterality: N/A;   RADIOLOGY WITH ANESTHESIA N/A 06/20/2021   Procedure: MRI WITH ANESTHESIA;  Surgeon: Radiologist, Medication, MD;  Location: [facility] OR;  Service: Radiology;  Laterality: N/A;   UMBILICAL HERNIA REPAIR N/A 04/30/2021  Procedure: OPEN UMBILICAL AND EPIGASTRIC HERNIA REPAIR;  Surgeon: Felicie Morn, MD;  Location: [facility] OR;  Service: General;  Laterality: N/A;    Current Medications: Current Meds  Medication Sig   acetaminophen (TYLENOL) 325 MG tablet Take 2 tablets (650 mg total) by mouth every 6 (six) hours as needed for  mild pain ((score 1 to 3) or temp > 100.5).   diclofenac Sodium (VOLTAREN) 1 % GEL Apply 2 g topically 2 (two) times daily.   furosemide (LASIX) 40 MG tablet Take 1 tablet (40 mg total) by mouth daily.   losartan (COZAAR) 25 MG tablet Take 1 tablet (25 mg total) by mouth daily.   metoprolol succinate (TOPROL-XL) 100 MG 24 hr tablet Take 1 tablet (100 mg total) by mouth daily. Take with or immediately following a meal.   oxyCODONE (OXY IR/ROXICODONE) 5 MG immediate release tablet Take 1 tablet (5 mg total) by mouth every 6 (six) hours as needed for moderate pain.   potassium chloride SA (KLOR-CON M) 20 MEQ tablet Take 1 tablet (20 mEq total) by mouth daily.   senna-docusate (SENOKOT-S) 8.6-50 MG tablet Take 1 tablet by mouth 2 (two) times daily.   sertraline (ZOLOFT) 25 MG tablet Take 25 mg by mouth every morning.   spironolactone (ALDACTONE) 25 MG tablet Take 1 tablet (25 mg total) by mouth daily.     Allergies:   Patient has no known allergies.   Social History   Socioeconomic History   Marital status: Single    Spouse name: Not on file   Number of children: 1   Years of education: Not on file   Highest education level: High school graduate  Occupational History    Comment: pending disability  Tobacco Use   Smoking status: Former    Types: Cigarettes    Quit date: 04/28/2020    Years since quitting: 1.3   Smokeless tobacco: Never  Vaping Use   Vaping Use: Never used  Substance and Sexual Activity   Alcohol use: Not Currently    Comment: before admission   Drug use: Not Currently    Types: Cocaine    Comment: past history   Sexual activity: Not Currently  Other Topics Concern   Not on file  Social History Narrative   ** Merged History Encounter **       Social Determinants of Health   Financial Resource Strain: Not on file  Food Insecurity: No Food Insecurity (07/02/2021)   Hunger Vital Sign    Worried About Running Out of Food in the Last Year: Never true    Ran Out  of Food in the Last Year: Never true  Transportation Needs: No Transportation Needs (07/02/2021)   PRAPARE - Hydrologist (Medical): No    Lack of Transportation (Non-Medical): No  Physical Activity: Not on file  Stress: Not on file  Social Connections: Not on file     Family History:  The patient's  family history is not on file.   ROS:   Please see the history of present illness.    ROS All other systems reviewed and are negative.   PHYSICAL EXAM:   VS:  BP 110/70 (BP Location: Left Arm, Patient Position: Sitting, Cuff Size: Normal)   Pulse 70   Ht 5' 6.5" (1.689 m)   Wt 176 lb (79.8 kg)   SpO2 98%   BMI 27.98 kg/m   Physical Exam  GEN: Well nourished, well developed, in no acute distress,  smells cigarettes smoking  Neck: no JVD, carotid bruits, or masses Cardiac:RRR; no murmurs, rubs, or gallops  Respiratory:  decreased breath sounds clear to auscultation bilaterally, normal work of breathing GI: soft, nontender, nondistended, + BS Ext: without cyanosis, clubbing, or edema, Good distal pulses bilaterally Neuro:  Alert and Oriented x 3,  Psych: euthymic mood, full affect  Wt Readings from Last 3 Encounters:  09/08/21 176 lb (79.8 kg)  09/03/21 172 lb (78 kg)  08/25/21 140 lb (63.5 kg)      Studies/Labs Reviewed:   EKG:  EKG is not ordered today.     Recent Labs: 05/13/2021: TSH 2.975 06/23/2021: ALT 13 06/28/2021: B Natriuretic Peptide >4,500.0 07/02/2021: Hemoglobin 9.5; Platelets 351 07/03/2021: Magnesium 1.6 07/04/2021: BUN 15; Creatinine, Ser 0.44; Potassium 4.6; Sodium 138   Lipid Panel    Component Value Date/Time   CHOL 62 04/23/2021 1801   TRIG 41 05/03/2021 0435   HDL 26 (L) 04/23/2021 1801   CHOLHDL 2.4 04/23/2021 1801   VLDL 10 04/23/2021 1801   LDLCALC 26 04/23/2021 1801    Additional studies/ records that were reviewed today include:    Echo 06/15/21 1. There is a mobile filamentous structure in the RA. 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 No valvular lesions are present to suggest endocarditis.  Of note, this structure was present on the prior echo from March 2023.   2. LV myocardium is hyper trabeculated. Consider contrast echo or MRI to  exclude noncompaction. Left ventricular ejection fraction, by estimation,  is 30 to 35%. The left ventricle has moderately decreased function. The  left ventricle demonstrates global   hypokinesis. Indeterminate diastolic filling due to E-A fusion.   3. Right ventricular systolic function is low normal. The right  ventricular size is normal. There is normal pulmonary artery systolic  pressure. The estimated right ventricular systolic pressure is 35.9 mmHg.   4. Left atrial size was severely dilated.   5. Mild to moderate mitral regurgitation is present on this study. The  prior study demonstrated possible moderate to severe MR. Would recommend  TEE for clarification. The mitral valve is grossly normal. Mild to  moderate mitral valve regurgitation. No  evidence of mitral stenosis.   6. The aortic valve is tricuspid. There is mild calcification of the  aortic valve. Aortic valve regurgitation is not visualized. Aortic valve  sclerosis is present, with no evidence of aortic valve stenosis.   7. The inferior vena cava is normal in size with greater than 50%  respiratory variability, suggesting right atrial pressure of 3 mmHg.   Comparison(s): Changes from prior study are noted. EF now 30-35%. MR is  now mild to moderate. 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   Risk Assessment/Calculations:    CHA2DS2-VASc Score = 1   This indicates a 0.6% annual risk of stroke. The patient's score is based upon: CHF History: 1 HTN History: 0 Diabetes History: 0 Stroke History: 0 Vascular Disease History: 0 Age Score: 0 Gender Score: 0         ASSESSMENT:    1. Chronic systolic CHF (congestive heart failure) (HCC)   2.  Paroxysmal atrial fibrillation (HCC)   3. MSSA bacteremia      PLAN:  In order of problems listed above:  Chronic systolic and diastolic heart failure -EF 35% diuresed ~17 L  Appears euvolemic patient taking 3 meds but not sure which ones. Will refill spironolactone  25 mg daily, losartan 25 mg daily, metoprolol. Refused blood work today -with bacteremia, limited mobility, felt to be high risk for UTI so SGLT2i not started   Paroxysmal atrial fibrillation with RVR -brief, spontaneously converted -continue metoprolol CHA2DS2/VAS Stroke Risk Points=1 -see notes from Dr. Anne Fu, not felt to be good long term anticoagulant candidate. As long as he remains in SR, we do not need to start anticoagulation. Having fast HR's with anxiety but HR regular here today   MSSA bacteremia: -with cervical spine epidural abscess s/p debridement and coccygeal osteomyelitis -see prior recommendations, not recommended for TEE given recent cervical spine surgery and retropharyngeal abscess    Shared Decision Making/Informed Consent        Medication Adjustments/Labs and Tests Ordered: Current medicines are reviewed at length with the patient today.  Concerns regarding medicines are outlined above.  Medication changes, Labs and Tests ordered today are listed in the Patient Instructions below. Patient Instructions  Medication Instructions:    Your physician recommends that you continue on your current medications as directed. Please refer to the Current Medication list given to you today.  *If you need a refill on your cardiac medications before your next appointment, please call your pharmacy*   Lab Work: BMET AND CBC TODAY   If you have labs (blood work) drawn today and your tests are completely normal, you will receive your results only by: MyChart Message (if you have MyChart) OR A paper copy in the mail If you have any lab test that is abnormal or we need to change your treatment, we will call  you to review the results.   Testing/Procedures: NONE ORDERED  TODAY    Follow-Up: At [facility], you and your health needs are our priority.  As part of our continuing mission to provide you with exceptional heart care, we have created designated Provider Care Teams.  These Care Teams include your primary Cardiologist (physician) and Advanced Practice Providers (APPs -  Physician Assistants and Nurse Practitioners) who all work together to provide you with the care you need, when you need it.  We recommend signing up for the patient portal called "MyChart".  Sign up information is provided on this After Visit Summary.  MyChart is used to connect with patients for Virtual Visits (Telemedicine).  Patients are able to view lab/test results, encounter notes, upcoming appointments, etc.  Non-urgent messages can be sent to your provider as well.   To learn more about what you can do with MyChart, go to [email or web address].    Your next appointment:   2 month(s)  The format for your next appointment:   In Person   Provider:   None  Dr. Lynnette Caffey    Other Instructions   Important Information About Sugar         Signed, Jacolyn Reedy, PA-C  09/08/2021 11:10 AM    [facility] [address] [place], [place], [place]  [address] Phone: [phone]; Fax: [phone]

## 2021-09-08 ENCOUNTER — Ambulatory Visit (INDEPENDENT_AMBULATORY_CARE_PROVIDER_SITE_OTHER): Payer: Medicaid Other | Admitting: Physician Assistant

## 2021-09-08 ENCOUNTER — Encounter: Payer: Self-pay | Admitting: Physician Assistant

## 2021-09-08 VITALS — BP 110/70 | HR 70 | Ht 66.5 in | Wt 176.0 lb

## 2021-09-08 DIAGNOSIS — I480 Paroxysmal atrial fibrillation: Secondary | ICD-10-CM | POA: Diagnosis not present

## 2021-09-08 DIAGNOSIS — B9561 Methicillin susceptible Staphylococcus aureus infection as the cause of diseases classified elsewhere: Secondary | ICD-10-CM

## 2021-09-08 DIAGNOSIS — R7881 Bacteremia: Secondary | ICD-10-CM

## 2021-09-08 DIAGNOSIS — I5022 Chronic systolic (congestive) heart failure: Secondary | ICD-10-CM | POA: Diagnosis not present

## 2021-09-08 NOTE — Patient Instructions (Addendum)
Medication Instructions:    Your physician recommends that you continue on your current medications as directed. Please refer to the Current Medication list given to you today.  *If you need a refill on your cardiac medications before your next appointment, please call your pharmacy*   Lab Work: BMET AND CBC TODAY   If you have labs (blood work) drawn today and your tests are completely normal, you will receive your results only by: MyChart Message (if you have MyChart) OR A paper copy in the mail If you have any lab test that is abnormal or we need to change your treatment, we will call you to review the results.   Testing/Procedures: NONE ORDERED  TODAY    Follow-Up: At [facility], you and your health needs are our priority.  As part of our continuing mission to provide you with exceptional heart care, we have created designated Provider Care Teams.  These Care Teams include your primary Cardiologist (physician) and Advanced Practice Providers (APPs -  Physician Assistants and Nurse Practitioners) who all work together to provide you with the care you need, when you need it.  We recommend signing up for the patient portal called "MyChart".  Sign up information is provided on this After Visit Summary.  MyChart is used to connect with patients for Virtual Visits (Telemedicine).  Patients are able to view lab/test results, encounter notes, upcoming appointments, etc.  Non-urgent messages can be sent to your provider as well.   To learn more about what you can do with MyChart, go to [email or web address].    Your next appointment:   2 month(s)  The format for your next appointment:   In Person   Provider:   None  Dr. Lynnette Caffey    Other Instructions   Important Information About Sugar

## 2021-09-08 NOTE — Progress Notes (Signed)
Patient ID: Logan Nelson, male    DOB: [birth year]  MRN: [id number]  CC: No chief complaint on file.   Subjective: Logan Nelson is a 56 y.o. male who presents for  His concerns today include:  Established with/referred to Cards, PT, Ortho, Wound Care, Neurosurgery, Infectious Disease 09/03/2021 Vascular  09/01/2021 Wound Care 09/01/2021 no show Ortho  08/31/2021 Infectious Disease  09/08/2021 Cards  Heard from PT or Neurosurgery?   Patient Active Problem List   Diagnosis Date Noted   Abscess in epidural space of cervical spine 06/23/2021   Epidural abscess    Acute systolic (congestive) heart failure ([place]) 06/17/2021   Prolonged QT interval 06/17/2021   MSSA bacteremia    Pressure ulcer of sacral region, stage 3 ([place])    Acute heart failure with preserved ejection fraction (HFpEF) ([place]) 06/13/2021   Anasarca    Pressure injury of sacral region, unstageable ([place]) 05/25/2021   Pressure injury of buttock, stage 2 ([place]) 05/14/2021   Hypomagnesemia 05/13/2021   Vitamin D deficiency 05/13/2021   Generalized weakness 05/12/2021   Hypophosphatemia 05/12/2021   Lower extremity weakness 05/11/2021   Hypokalemia 05/11/2021   Anemia of chronic disease 05/11/2021   GERD (gastroesophageal reflux disease) 05/11/2021   Bilateral inguinal hernia 04/23/2021   Protein-calorie malnutrition, severe 04/14/2021   Protein calorie malnutrition ([place]) 04/13/2021   Protein-calorie malnutrition ([place]) 04/12/2021   Hepatic steatosis 04/12/2021   Stroke ([place]) 04/11/2021   Small bowel obstruction ([place]) 10/13/2020   Anemia due to vitamin B12 deficiency 10/13/2020   Hypertension 01/09/2015   Incisional hernia 01/07/2015     Current Outpatient Medications on File Prior to Visit  Medication Sig Dispense Refill   acetaminophen (TYLENOL) 325 MG tablet Take 2 tablets (650 mg total) by mouth every 6 (six) hours as needed for mild pain ((score 1 to 3) or temp > 100.5).     diclofenac Sodium  (VOLTAREN) 1 % GEL Apply 2 g topically 2 (two) times daily.     furosemide (LASIX) 40 MG tablet Take 1 tablet (40 mg total) by mouth daily. 30 tablet 1   losartan (COZAAR) 25 MG tablet Take 1 tablet (25 mg total) by mouth daily. 30 tablet 1   metoprolol succinate (TOPROL-XL) 100 MG 24 hr tablet Take 1 tablet (100 mg total) by mouth daily. Take with or immediately following a meal. 30 tablet 1   oxyCODONE (OXY IR/ROXICODONE) 5 MG immediate release tablet Take 1 tablet (5 mg total) by mouth every 6 (six) hours as needed for moderate pain. 20 tablet 0   potassium chloride SA (KLOR-CON M) 20 MEQ tablet Take 1 tablet (20 mEq total) by mouth daily.     senna-docusate (SENOKOT-S) 8.6-50 MG tablet Take 1 tablet by mouth 2 (two) times daily.     sertraline (ZOLOFT) 25 MG tablet Take 25 mg by mouth every morning.     spironolactone (ALDACTONE) 25 MG tablet Take 1 tablet (25 mg total) by mouth daily. 30 tablet 1   No current facility-administered medications on file prior to visit.    No Known Allergies  Social History   Socioeconomic History   Marital status: Single    Spouse name: Not on file   Number of children: 1   Years of education: Not on file   Highest education level: High school graduate  Occupational History    Comment: pending disability  Tobacco Use   Smoking status: Former    Types: Cigarettes    Quit date: 04/28/2020  Years since quitting: 1.3   Smokeless tobacco: Never  Vaping Use   Vaping Use: Never used  Substance and Sexual Activity   Alcohol use: Not Currently    Comment: before admission   Drug use: Not Currently    Types: Cocaine    Comment: past history   Sexual activity: Not Currently  Other Topics Concern   Not on file  Social History Narrative   ** Merged History Encounter **       Social Determinants of Health   Financial Resource Strain: Not on file  Food Insecurity: No Food Insecurity (07/02/2021)   Hunger Vital Sign    Worried About Running Out  of Food in the Last Year: Never true    Ran Out of Food in the Last Year: Never true  Transportation Needs: No Transportation Needs (07/02/2021)   PRAPARE - Administrator, Civil Service (Medical): No    Lack of Transportation (Non-Medical): No  Physical Activity: Not on file  Stress: Not on file  Social Connections: Not on file  Intimate Partner Violence: Not on file    No family history on file.  Past Surgical History:  Procedure Laterality Date   ABDOMINAL SURGERY     ANTERIOR CERVICAL DECOMP/DISCECTOMY FUSION N/A 06/23/2021   Procedure: Anterior cervical Decompression and epidural Abcess evacuation with arthodesis cervical Four -five;  Surgeon: Coletta Memos, MD;  Location[facility];  Service: Neurosurgery;  Laterality: N/A;   INGUINAL HERNIA REPAIR Right 04/30/2021   Procedure: HERNIA REPAIR INGUINAL WITH MESH;  Surgeon: Stechschulte, Hyman Hopes, MD;  Location: [facility] OR;  Service: General;  Laterality: Right;   LYSIS OF ADHESION  04/30/2021   Procedure: LYSIS OF ADHESION;  Surgeon: Quentin Ore, MD;  Location: [facility] OR;  Service: General;;   RADIOLOGY WITH ANESTHESIA N/A 06/16/2021   Procedure: MRI CERVICAL THORACIC LUMBER AND SACRUM;  Surgeon: Radiologist, Medication, MD;  Location: [facility] OR;  Service: Radiology;  Laterality: N/A;   RADIOLOGY WITH ANESTHESIA N/A 06/18/2021   Procedure: MRI WITH ANESTHESIA;  Surgeon: Radiologist, Medication, MD;  Location: [facility] OR;  Service: Radiology;  Laterality: N/A;   RADIOLOGY WITH ANESTHESIA N/A 06/20/2021   Procedure: MRI WITH ANESTHESIA;  Surgeon: Radiologist, Medication, MD;  Location: [facility] OR;  Service: Radiology;  Laterality: N/A;   UMBILICAL HERNIA REPAIR N/A 04/30/2021   Procedure: OPEN UMBILICAL AND EPIGASTRIC HERNIA REPAIR;  Surgeon: Quentin Ore, MD;  Location: [facility] OR;  Service: General;  Laterality: N/A;    ROS: Review of Systems Negative except as stated above  PHYSICAL EXAM: There were no vitals taken for this  visit.  Physical Exam  {male adult master:[id number]} {male adult master:[id number]}     Latest Ref Rng & Units 07/04/2021    4:58 AM 07/03/2021    4:58 AM 07/01/2021    4:14 AM  CMP  Glucose 70 - 99 mg/dL 84  540  79   BUN 6 - 20 mg/dL 15  14  14    Creatinine 0.61 - 1.24 mg/dL  9.81  1.91   Sodium 135 - 145 mmol/L 138  137  138   Potassium 3.5 - 5.1 mmol/L 4.6  4.1  4.1   Chloride 98 - 111 mmol/L 105  104  105   CO2 22 - 32 mmol/L 27  28  26    Calcium 8.9 - 10.3 mg/dL 8.9  8.5  8.6    Lipid Panel     Component Value Date/Time   CHOL 62 04/23/2021  1801   TRIG 41 05/03/2021 0435   HDL 26 (L) 04/23/2021 1801   CHOLHDL 2.4 04/23/2021 1801   VLDL 10 04/23/2021 1801   LDLCALC 26 04/23/2021 1801    CBC    Component Value Date/Time   WBC 11.5 (H) 07/02/2021 0422   RBC 3.20 (L) 07/02/2021 0422   HGB 9.5 (L) 07/02/2021 0422   HGB 12.3 (L) 12/08/2020 1103   HCT 31.1 (L) 07/02/2021 0422   HCT 38.6 12/08/2020 1103   PLT 351 07/02/2021 0422   PLT 259 12/08/2020 1103   MCV 97.2 07/02/2021 0422   MCV 93 12/08/2020 1103   MCH 29.7 07/02/2021 0422   MCHC 30.5 07/02/2021 0422   RDW 18.8 (H) 07/02/2021 0422   RDW 12.7 12/08/2020 1103   LYMPHSABS 3.0 06/30/2021 0340   LYMPHSABS 1.7 12/08/2020 1103   MONOABS 1.4 (H) 06/30/2021 0340   EOSABS 0.2 06/30/2021 0340   EOSABS 0.1 12/08/2020 1103   BASOSABS 0.1 06/30/2021 0340   BASOSABS 0.0 12/08/2020 1103    ASSESSMENT AND PLAN:  There are no diagnoses linked to this encounter.   Patient was given the opportunity to ask questions.  Patient verbalized understanding of the plan and was able to repeat key elements of the plan. Patient was given clear instructions to go to Emergency Department or return to medical center if symptoms don't improve, worsen, or new problems develop.The patient verbalized understanding.   No orders of the defined types were placed in this encounter.    Requested Prescriptions    No prescriptions  requested or ordered in this encounter    No follow-ups on file.  Rema Fendt, NP

## 2021-09-13 ENCOUNTER — Encounter: Payer: Medicaid Other | Admitting: Family

## 2021-09-14 ENCOUNTER — Ambulatory Visit: Payer: Medicaid Other | Admitting: Family Medicine

## 2021-09-15 ENCOUNTER — Telehealth: Payer: Self-pay | Admitting: Family Medicine

## 2021-09-15 ENCOUNTER — Ambulatory Visit (INDEPENDENT_AMBULATORY_CARE_PROVIDER_SITE_OTHER): Payer: Medicaid Other | Admitting: Physician Assistant

## 2021-09-15 VITALS — BP 117/77 | Temp 97.6°F

## 2021-09-15 DIAGNOSIS — G8929 Other chronic pain: Secondary | ICD-10-CM | POA: Diagnosis not present

## 2021-09-15 DIAGNOSIS — M25511 Pain in right shoulder: Secondary | ICD-10-CM | POA: Diagnosis not present

## 2021-09-15 NOTE — Telephone Encounter (Signed)
Copied from CRM [id number]. Topic: General - Other >> Sep 15, 2021 10:14 AM Macon Large wrote: Reason for CRM: Pt very upset that Dr. Andrey Campanile referred him to another doctor for pain but he is being told by that location that they can not prescribe him any pain medications. Pt stated he feels like he is getting the run around and would like a return call to advise what can be done about his pain because he is now being told he needs to contact his primary doctor. Cb# [phone]

## 2021-09-15 NOTE — Progress Notes (Unsigned)
Office Visit Note   Patient: Logan Nelson           Date of Birth: 1965/06/23           MRN: [id number] Visit Date: 09/15/2021              Requested by: Rema Fendt, NP [address] [place],  [place] [address] PCP: [name], MD   Assessment & Plan: Visit Diagnoses:  1. 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  Today, we obtained vital signs which were unremarkable.  I have discussed this with Dr.Brystol Wasilewski where we have offered the patient referral to Dr. Steward Drone for glenohumeral joint injection.  He is interested in this.  He will follow-up with [name] as needed.  Follow-Up Instructions: Return if symptoms worsen or fail to improve.   Orders:  No orders of the defined types were placed in this encounter.  No orders of the defined types were placed in this encounter.     Procedures: No procedures performed   Clinical Data: No additional findings.   Subjective: Chief Complaint  Patient presents with   Right Shoulder - Pain    HPI patient is a 56 year old gentleman who comes in today for right upper extremity pain.  He was recently admitted to [facility] in May of this year with MSSA bacteremia likely seeded from cervical spine epidural abscess with cord compression and coccygeal osteomyelitis.  He underwent anterior decompression C4-C5 as well as arthrodesis C4-C5.  During his hospital stay it was found that he had advanced degenerative changes to the right glenohumeral joint with questionable septic arthritis.  During his hospital stay, his shoulder symptoms resolved.  He is here today for follow-up.  He complains of pain from the neck down the entire right upper arm.  His pain is constant and is worse with any movement of the neck.  He denies any paresthesias to the right upper extremity.  He denies any fevers  or chills.  I am unable to answer any more questions as he does not feel like answering any more.  He is very upset that his primary care provider will not refill his oxycodone and has asked [name] to continue these refills.  Review of Systems as detailed in HPI.  All others reviewed and are negative.   Objective: Vital Signs: BP 117/77   Temp 97.6 F (36.4 C) (Oral)   Physical Exam well-developed well-nourished gentleman in no acute distress.  Alert and oriented x 3.  Blood pressure 117/77, heart rate 62 oral temperature 97.6 F  Ortho Exam cervical spine exam/right shoulder exam-unable to obtain due to patient not willing to participate.  Specialty Comments:  No specialty comments available.  Imaging: No results found.   PMFS History: Patient Active Problem List   Diagnosis Date Noted   Abscess in epidural space of cervical spine 06/23/2021   Epidural abscess    Acute systolic (congestive) heart failure ([facility]) 06/17/2021   Prolonged QT interval 06/17/2021   MSSA bacteremia    Pressure ulcer of sacral region, stage 3 ([facility])    Acute heart failure with preserved ejection fraction (HFpEF) ([facility]) 06/13/2021   Anasarca    Pressure injury of sacral region, unstageable ([facility]) 05/25/2021   Pressure injury of buttock, stage 2 ([facility]) 05/14/2021   Hypomagnesemia 05/13/2021   Vitamin D deficiency 05/13/2021  Generalized weakness 05/12/2021   Hypophosphatemia 05/12/2021   Lower extremity weakness 05/11/2021   Hypokalemia 05/11/2021   Anemia of chronic disease 05/11/2021   GERD (gastroesophageal reflux disease) 05/11/2021   Bilateral inguinal hernia 04/23/2021   Protein-calorie malnutrition, severe 04/14/2021   Protein calorie malnutrition ([facility]) 04/13/2021   Protein-calorie malnutrition ([facility]) 04/12/2021   Hepatic steatosis 04/12/2021   Stroke ([facility]) 04/11/2021   Small bowel obstruction ([facility]) 10/13/2020   Anemia due to vitamin B12 deficiency 10/13/2020   Hypertension 01/09/2015    Incisional hernia 01/07/2015   Past Medical History:  Diagnosis Date   Anemia    Arthritis    Bradycardia    Dyspnea    Falling episodes    GERD (gastroesophageal reflux disease)    Hernia of abdominal wall    Hypertension    Lower extremity edema     No family history on file.  Past Surgical History:  Procedure Laterality Date   ABDOMINAL SURGERY     ANTERIOR CERVICAL DECOMP/DISCECTOMY FUSION N/A 06/23/2021   Procedure: Anterior cervical Decompression and epidural Abcess evacuation with arthodesis cervical Four -five;  Surgeon: Coletta Memos, MD;  Location: [facility] OR;  Service: Neurosurgery;  Laterality: N/A;   INGUINAL HERNIA REPAIR Right 04/30/2021   Procedure: HERNIA REPAIR INGUINAL WITH MESH;  Surgeon: Stechschulte, Hyman Hopes, MD;  Location: [facility] OR;  Service: General;  Laterality: Right;   LYSIS OF ADHESION  04/30/2021   Procedure: LYSIS OF ADHESION;  Surgeon: Quentin Ore, MD;  Location: [facility] OR;  Service: General;;   RADIOLOGY WITH ANESTHESIA N/A 06/16/2021   Procedure: MRI CERVICAL THORACIC LUMBER AND SACRUM;  Surgeon: Radiologist, Medication, MD;  Location: [facility] OR;  Service: Radiology;  Laterality: N/A;   RADIOLOGY WITH ANESTHESIA N/A 06/18/2021   Procedure: MRI WITH ANESTHESIA;  Surgeon: Radiologist, Medication, MD;  Location: [facility] OR;  Service: Radiology;  Laterality: N/A;   RADIOLOGY WITH ANESTHESIA N/A 06/20/2021   Procedure: MRI WITH ANESTHESIA;  Surgeon: Radiologist, Medication, MD;  Location: [facility] OR;  Service: Radiology;  Laterality: N/A;   UMBILICAL HERNIA REPAIR N/A 04/30/2021   Procedure: OPEN UMBILICAL AND EPIGASTRIC HERNIA REPAIR;  Surgeon: Quentin Ore, MD;  Location: [facility] OR;  Service: General;  Laterality: N/A;   Social History   Occupational History    Comment: pending disability  Tobacco Use   Smoking status: Former    Types: Cigarettes    Quit date: 04/28/2020    Years since quitting: 1.3   Smokeless tobacco: Never  Vaping Use   Vaping Use: Never used   Substance and Sexual Activity   Alcohol use: Not Currently    Comment: before admission   Drug use: Not Currently    Types: Cocaine    Comment: past history   Sexual activity: Not Currently

## 2021-09-16 NOTE — Telephone Encounter (Signed)
Please provide feedback for patient

## 2021-09-20 ENCOUNTER — Telehealth: Payer: Self-pay | Admitting: Physician Assistant

## 2021-09-20 NOTE — Telephone Encounter (Signed)
Patient states at his last appt with Elon Jester they discussed someone calling him about receiving injections in his neck for his arthritis. He reports he hasn't heard anything and would like a callback with an update. Please advise.

## 2021-09-20 NOTE — Telephone Encounter (Signed)
Called patient to let him know none of our providers referred him for neck injections. Informed patient that he saw Trenda Moots PA on 8/9 and she referred him to Dr. Steward Drone. Gave patient number to contact these providers. Informed patient that from his chart he has an appointment with Dr. Steward Drone on 09/29/21. Patient thanked me for the information.

## 2021-09-21 ENCOUNTER — Encounter: Payer: Self-pay | Admitting: Family

## 2021-09-21 ENCOUNTER — Telehealth: Payer: Self-pay | Admitting: Family Medicine

## 2021-09-21 ENCOUNTER — Other Ambulatory Visit: Payer: Self-pay

## 2021-09-21 ENCOUNTER — Ambulatory Visit (INDEPENDENT_AMBULATORY_CARE_PROVIDER_SITE_OTHER): Payer: Medicaid Other | Admitting: Family

## 2021-09-21 VITALS — BP 122/78 | HR 60 | Resp 16 | Ht 66.5 in | Wt 178.2 lb

## 2021-09-21 DIAGNOSIS — R7881 Bacteremia: Secondary | ICD-10-CM

## 2021-09-21 DIAGNOSIS — B9561 Methicillin susceptible Staphylococcus aureus infection as the cause of diseases classified elsewhere: Secondary | ICD-10-CM | POA: Diagnosis not present

## 2021-09-21 DIAGNOSIS — L89153 Pressure ulcer of sacral region, stage 3: Secondary | ICD-10-CM

## 2021-09-21 DIAGNOSIS — G061 Intraspinal abscess and granuloma: Secondary | ICD-10-CM

## 2021-09-21 MED ORDER — CEFADROXIL 500 MG PO CAPS: 500.0000 mg | ORAL_CAPSULE | Freq: Two times a day (BID) | ORAL | 0 refills | Status: DC

## 2021-09-21 NOTE — Progress Notes (Signed)
Subjective:    Patient ID: Logan Nelson, male    DOB: [birth year], 56 y.o.   MRN: [id number]  Chief Complaint  Patient presents with   Establish Care    Patient in pain and thought this visit was for pain meds. Will see ortho on 09/29/21 for injections. He also states he has not been able to get refills of BP meds from his PCP.Secure chat sent to PCP requesting refills on all B meds they handle.     HPI:  Logan Nelson is a 56 y.o. male with previous hospitalization for MSSA bacteremia  complicated by D3-T7 epidural abscess with discitis/osteomyelitis s/p anterior cervical decompression and abscess evacuation and osteomyelitis of the sacrum who was treated with 6 weeks of Cefazolin with end date of 08/04/21. TEE was unable to be completed secondary to neck surgery. Unfortunately he did not show either of his 2 follow up appointments. Was seen by his Internal Medicine provider on 08/25/21 with referrals placed to Wound Care for his Stage 3 pressure injury and ID and Neurosurgery for follow up of his MSSA infection and neck/shoulder pain. Here today for follow up.  Logan Nelson completed his antibiotics at the end of June as prescribed with no adverse side effects and has been off antibiotics since that time. Continues to have pain located in his neck and right shoulder with severity that he is not able to use them without pain. Pain has been refractory to Voltaren gel. Is scheduled with Orthopedics for shoulder injection. Has concern that infection is causing his shoulder pain. Neck surgical wound has healed without complication. Stage 3 ulcer has also healed over without problems. Denies systemic symptoms of fevers, chills, or sweats. Previous ESR 15.5 and Sed rate of 30.  No Known Allergies    Outpatient Medications Prior to Visit  Medication Sig Dispense Refill   losartan (COZAAR) 25 MG tablet Take 1 tablet (25 mg total) by mouth daily. 30 tablet 1   metoprolol succinate  (TOPROL-XL) 100 MG 24 hr tablet Take 1 tablet (100 mg total) by mouth daily. Take with or immediately following a meal. 30 tablet 1   spironolactone (ALDACTONE) 25 MG tablet Take 1 tablet (25 mg total) by mouth daily. 30 tablet 1   acetaminophen (TYLENOL) 325 MG tablet Take 2 tablets (650 mg total) by mouth every 6 (six) hours as needed for mild pain ((score 1 to 3) or temp > 100.5). (Patient not taking: Reported on 09/21/2021)     diclofenac Sodium (VOLTAREN) 1 % GEL Apply 2 g topically 2 (two) times daily. (Patient not taking: Reported on 09/21/2021)     furosemide (LASIX) 40 MG tablet Take 1 tablet (40 mg total) by mouth daily. (Patient not taking: Reported on 09/21/2021) 30 tablet 1   oxyCODONE (OXY IR/ROXICODONE) 5 MG immediate release tablet Take 1 tablet (5 mg total) by mouth every 6 (six) hours as needed for moderate pain. (Patient not taking: Reported on 09/21/2021) 20 tablet 0   potassium chloride SA (KLOR-CON M) 20 MEQ tablet Take 1 tablet (20 mEq total) by mouth daily. (Patient not taking: Reported on 09/21/2021)     senna-docusate (SENOKOT-S) 8.6-50 MG tablet Take 1 tablet by mouth 2 (two) times daily. (Patient not taking: Reported on 09/21/2021)     sertraline (ZOLOFT) 25 MG tablet Take 25 mg by mouth every morning. (Patient not taking: Reported on 09/21/2021)     No facility-administered medications prior to visit.     Past Medical History:  Diagnosis Date   Anemia    Arthritis    Bradycardia    Dyspnea    Falling episodes    GERD (gastroesophageal reflux disease)    Hernia of abdominal wall    Hypertension    Lower extremity edema      Past Surgical History:  Procedure Laterality Date   ABDOMINAL SURGERY     ANTERIOR CERVICAL DECOMP/DISCECTOMY FUSION N/A 06/23/2021   Procedure: Anterior cervical Decompression and epidural Abcess evacuation with arthodesis cervical Four -five;  Surgeon: Ashok Pall, MD;  Location: [place];  Service: Neurosurgery;  Laterality: N/A;   INGUINAL  HERNIA REPAIR Right 04/30/2021   Procedure: HERNIA REPAIR INGUINAL WITH MESH;  Surgeon: Stechschulte, Nickola Major, MD;  Location: [place];  Service: General;  Laterality: Right;   LYSIS OF ADHESION  04/30/2021   Procedure: LYSIS OF ADHESION;  Surgeon: Felicie Morn, MD;  Location: [place];  Service: General;;   RADIOLOGY WITH ANESTHESIA N/A 06/16/2021   Procedure: MRI CERVICAL THORACIC LUMBER AND SACRUM;  Surgeon: Radiologist, Medication, MD;  Location: [place];  Service: Radiology;  Laterality: N/A;   RADIOLOGY WITH ANESTHESIA N/A 06/18/2021   Procedure: MRI WITH ANESTHESIA;  Surgeon: Radiologist, Medication, MD;  Location: [place];  Service: Radiology;  Laterality: N/A;   RADIOLOGY WITH ANESTHESIA N/A 06/20/2021   Procedure: MRI WITH ANESTHESIA;  Surgeon: Radiologist, Medication, MD;  Location: [place];  Service: Radiology;  Laterality: N/A;   UMBILICAL HERNIA REPAIR N/A 04/30/2021   Procedure: OPEN UMBILICAL AND EPIGASTRIC HERNIA REPAIR;  Surgeon: Felicie Morn, MD;  Location: [place];  Service: General;  Laterality: N/A;       Review of Systems  Constitutional:  Negative for chills, diaphoresis, fatigue and fever.  Respiratory:  Negative for cough, chest tightness, shortness of breath and wheezing.   Cardiovascular:  Negative for chest pain.  Gastrointestinal:  Negative for abdominal pain, diarrhea, nausea and vomiting.  Musculoskeletal:  Positive for neck pain and neck stiffness.       Right shoulder pain      Objective:    BP 122/78   Pulse 60   Resp 16   Ht 5' 6.5" (1.689 m)   Wt 178 lb 3.2 oz (80.8 kg)   SpO2 99%   BMI 28.33 kg/m  Nursing note and vital signs reviewed.  Physical Exam Constitutional:      General: He is not in acute distress.    Appearance: He is well-developed.  Neck:     Comments: Anterior cervical incision healed without complication.  Cardiovascular:     Rate and Rhythm: Normal rate and regular rhythm.     Heart sounds: Normal heart sounds.   Pulmonary:     Effort: Pulmonary effort is normal.     Breath sounds: Normal breath sounds.  Genitourinary:    Comments: Previous wounds have completely closed and healed over without problems.  Skin:    General: Skin is warm and dry.  Neurological:     Mental Status: He is alert and oriented to person, place, and time.  Psychiatric:        Behavior: Behavior normal.        Thought Content: Thought content normal.        Judgment: Judgment normal.         09/21/2021    9:16 AM 08/25/2021   10:01 AM 11/04/2020    1:52 PM  Depression screen PHQ 2/9  Decreased Interest 0 0 1  Down, Depressed, Hopeless 0  0   PHQ - 2 Score 0 0 1  Altered sleeping   1  Tired, decreased energy   0  Change in appetite   0  Feeling bad or failure about yourself    0  Trouble concentrating   0  Moving slowly or fidgety/restless   0  Suicidal thoughts   0  PHQ-9 Score   2       Assessment & Plan:    Patient Active Problem List   Diagnosis Date Noted   Abscess in epidural space of cervical spine 06/23/2021   Epidural abscess    Acute systolic (congestive) heart failure (HCC) 06/17/2021   Prolonged QT interval 06/17/2021   MSSA bacteremia    Pressure ulcer of sacral region, stage 3 ([place])    Acute heart failure with preserved ejection fraction (HFpEF) ([place]) 06/13/2021   Anasarca    Pressure injury of sacral region, unstageable ([place]) 05/25/2021   Pressure injury of buttock, stage 2 ([place]) 05/14/2021   Hypomagnesemia 05/13/2021   Vitamin D deficiency 05/13/2021   Generalized weakness 05/12/2021   Hypophosphatemia 05/12/2021   Lower extremity weakness 05/11/2021   Hypokalemia 05/11/2021   Anemia of chronic disease 05/11/2021   GERD (gastroesophageal reflux disease) 05/11/2021   Bilateral inguinal hernia 04/23/2021   Protein-calorie malnutrition, severe 04/14/2021   Protein calorie malnutrition ([place]) 04/13/2021   Protein-calorie malnutrition ([place]) 04/12/2021   Hepatic steatosis 04/12/2021    Stroke ([place]) 04/11/2021   Small bowel obstruction ([place]) 10/13/2020   Anemia due to vitamin B12 deficiency 10/13/2020   Hypertension 01/09/2015   Incisional hernia 01/07/2015     Problem List Items Addressed This Visit       Nervous and Auditory   Abscess in epidural space of cervical spine    Logan Nelson completed 6 weeks of IV cefazolin at the end of June and has not been on antibiotics since that time with continued neck and right shoulder pain. Surgical incision has healed. There is no clear evidence of shoulder infection at this time given that his other wounds have healed as well. If concern for right shoulder infection would recommend aspiration. Refuses lab work today and complains of needing pain medication.  Does not appear to need antibiotics but he is insistent it will help with his pain. Recommend follow up with Dr. Christella Noa. Will give 30 days of cefadroxil and plan to see him back in 1 month or sooner if needed.         Other   MSSA bacteremia - Primary    Appears resolved with no systemic symptoms and has been off antibiotics for almost 1.5 months.       Pressure ulcer of sacral region, stage 3 (HCC)    Wound has resolved without complication and no evidence of infection.         I am having Logan Nelson start on cefadroxil. I am also having him maintain his acetaminophen, oxyCODONE, senna-docusate, potassium chloride SA, sertraline, diclofenac Sodium, spironolactone, losartan, furosemide, and metoprolol succinate.   Meds ordered this encounter  Medications   cefadroxil (DURICEF) 500 MG capsule    Sig: Take 1 capsule (500 mg total) by mouth 2 (two) times daily.    Dispense:  60 capsule    Refill:  0    Order Specific Question:   Supervising Provider    Answer:   Carlyle Basques [[id number]]     Follow-up: Return in about 1 month (around 10/22/2021), or if symptoms worsen or fail to  improve.   Terri Piedra, MSN, FNP-C Nurse Practitioner [facility] [place] number: [phone]

## 2021-09-21 NOTE — Telephone Encounter (Signed)
..   Medicaid Managed Care   Unsuccessful Outreach Note  09/21/2021 Name: Logan Nelson MRN: [id number] DOB: April 11, 1965  Referred by: [name], MD Reason for referral : High Risk Managed Medicaid (I called the patient today to get him scheduled with the MM Team. I left my name and number on his VM.)   An unsuccessful telephone outreach was attempted today. The patient was referred to the case management team for assistance with care management and care coordination.   Follow Up Plan: The care management team will reach out to the patient again over the next 14 days.   Weston Settle Care Guide, High Risk Medicaid Managed Care Embedded Care Coordination [facility]  [facility]    SIGNATURE

## 2021-09-21 NOTE — Assessment & Plan Note (Signed)
Appears resolved with no systemic symptoms and has been off antibiotics for almost 1.5 months.

## 2021-09-21 NOTE — Assessment & Plan Note (Addendum)
Logan Nelson completed 6 weeks of IV cefazolin at the end of June and has not been on antibiotics since that time with continued neck and right shoulder pain. Surgical incision has healed. There is no clear evidence of shoulder infection at this time given that his other wounds have healed as well. If concern for right shoulder infection would recommend aspiration. Refuses lab work today and complains of needing pain medication.  Does not appear to need antibiotics but he is insistent it will help with his pain. Recommend follow up with Logan Nelson. Will give 30 days of cefadroxil and plan to see him back in 1 month or sooner if needed.

## 2021-09-21 NOTE — Assessment & Plan Note (Signed)
Wound has resolved without complication and no evidence of infection.

## 2021-09-21 NOTE — Telephone Encounter (Signed)
Are you referring the patient?

## 2021-09-21 NOTE — Patient Instructions (Addendum)
Nice to see you.  Take the cefadroxil twice daily until complete.   Refills have been sent to the pharmacy.  Make a follow up appointment with Dr. Sueanne Margarita office  [phone]  Plan for follow up in 1 months or sooner if needed with lab work on the same day.  Have a great day and stay safe!

## 2021-09-21 NOTE — Telephone Encounter (Signed)
Are you referring the patient?  

## 2021-09-23 DIAGNOSIS — G061 Intraspinal abscess and granuloma: Secondary | ICD-10-CM | POA: Diagnosis not present

## 2021-09-23 NOTE — Telephone Encounter (Signed)
error 

## 2021-09-29 ENCOUNTER — Ambulatory Visit (INDEPENDENT_AMBULATORY_CARE_PROVIDER_SITE_OTHER): Payer: Medicaid Other | Admitting: Orthopaedic Surgery

## 2021-09-29 ENCOUNTER — Telehealth: Payer: Self-pay | Admitting: Family Medicine

## 2021-09-29 ENCOUNTER — Telehealth: Payer: Self-pay

## 2021-09-29 DIAGNOSIS — G8929 Other chronic pain: Secondary | ICD-10-CM

## 2021-09-29 DIAGNOSIS — M25511 Pain in right shoulder: Secondary | ICD-10-CM

## 2021-09-29 MED ORDER — TRIAMCINOLONE ACETONIDE 40 MG/ML IJ SUSP
80.0000 mg | INTRAMUSCULAR | Status: AC | PRN
  Administered 2021-09-29: 80 mg via INTRA_ARTICULAR

## 2021-09-29 MED ORDER — LIDOCAINE HCL 1 % IJ SOLN
4.0000 mL | INTRAMUSCULAR | Status: AC | PRN
  Administered 2021-09-29: 4 mL

## 2021-09-29 NOTE — Progress Notes (Signed)
Chief Complaint: Right shoulder pain     History of Present Illness:    Logan Nelson is a 56 y.o. male right-hand-dominant presents with right shoulder pain which has now been ongoing for several years.  He states that he has very limited overhead activity.  He recently had an infection drained in his neck.  He is here today as a referral from Dr. Warren Danes office for right shoulder ultrasound-guided injection    Surgical History:   None  PMH/PSH/Family History/Social History/Meds/Allergies:    Past Medical History:  Diagnosis Date  . Anemia   . Arthritis   . Bradycardia   . Dyspnea   . Falling episodes   . GERD (gastroesophageal reflux disease)   . Hernia of abdominal wall   . Hypertension   . Lower extremity edema    Past Surgical History:  Procedure Laterality Date  . ABDOMINAL SURGERY    . ANTERIOR CERVICAL DECOMP/DISCECTOMY FUSION N/A 06/23/2021   Procedure: Anterior cervical Decompression and epidural Abcess evacuation with arthodesis cervical Four -five;  Surgeon: Coletta Memos, MD;  [facility];  Service: Neurosurgery;  Laterality: N/A;  . INGUINAL HERNIA REPAIR Right 04/30/2021   Procedure: HERNIA REPAIR INGUINAL WITH MESH;  Surgeon: Stechschulte, Hyman Hopes, MD;  Location: [facility] OR;  Service: General;  Laterality: Right;  . LYSIS OF ADHESION  04/30/2021   Procedure: LYSIS OF ADHESION;  Surgeon: Quentin Ore, MD;  Location: [facility] OR;  Service: General;;  . RADIOLOGY WITH ANESTHESIA N/A 06/16/2021   Procedure: MRI CERVICAL THORACIC LUMBER AND SACRUM;  Surgeon: Radiologist, Medication, MD;  Location: [facility] OR;  Service: Radiology;  Laterality: N/A;  . RADIOLOGY WITH ANESTHESIA N/A 06/18/2021   Procedure: MRI WITH ANESTHESIA;  Surgeon: Radiologist, Medication, MD;  Location: [facility] OR;  Service: Radiology;  Laterality: N/A;  . RADIOLOGY WITH ANESTHESIA N/A 06/20/2021   Procedure: MRI WITH ANESTHESIA;  Surgeon: Radiologist, Medication, MD;   Location[facility];  Service: Radiology;  Laterality: N/A;  . UMBILICAL HERNIA REPAIR N/A 04/30/2021   Procedure: OPEN UMBILICAL AND EPIGASTRIC HERNIA REPAIR;  Surgeon: Quentin Ore, MD;  Location: [facility] OR;  Service: General;  Laterality: N/A;   Social History   Socioeconomic History  . Marital status: Single    Spouse name: Not on file  . Number of children: 1  . Years of education: Not on file  . Highest education level: High school graduate  Occupational History    Comment: pending disability  Tobacco Use  . Smoking status: Former    Types: Cigarettes    Quit date: 04/28/2020    Years since quitting: 1.4  . Smokeless tobacco: Never  Vaping Use  . Vaping Use: Never used  Substance and Sexual Activity  . Alcohol use: Not Currently    Comment: before admission  . Drug use: Not Currently    Types: Cocaine    Comment: past history  . Sexual activity: Not Currently  Other Topics Concern  . Not on file  Social History Narrative   ** Merged History Encounter **       Social Determinants of Health   Financial Resource Strain: Not on file  Food Insecurity: No Food Insecurity (07/02/2021)   Hunger Vital Sign   . Worried About Programme researcher, broadcasting/film/video in the Last Year: Never true   .  Ran Out of Food in the Last Year: Never true  Transportation Needs: No Transportation Needs (07/02/2021)   PRAPARE - Transportation   . Lack of Transportation (Medical): No   . Lack of Transportation (Non-Medical): No  Physical Activity: Not on file  Stress: Not on file  Social Connections: Not on file   No family history on file. No Known Allergies Current Outpatient Medications  Medication Sig Dispense Refill  . acetaminophen (TYLENOL) 325 MG tablet Take 2 tablets (650 mg total) by mouth every 6 (six) hours as needed for mild pain ((score 1 to 3) or temp > 100.5). (Patient not taking: Reported on 09/21/2021)    . cefadroxil (DURICEF) 500 MG capsule Take 1 capsule (500 mg total) by mouth 2 (two)  times daily. 60 capsule 0  . diclofenac Sodium (VOLTAREN) 1 % GEL Apply 2 g topically 2 (two) times daily. (Patient not taking: Reported on 09/21/2021)    . furosemide (LASIX) 40 MG tablet Take 1 tablet (40 mg total) by mouth daily. (Patient not taking: Reported on 09/21/2021) 30 tablet 1  . losartan (COZAAR) 25 MG tablet Take 1 tablet (25 mg total) by mouth daily. 30 tablet 1  . metoprolol succinate (TOPROL-XL) 100 MG 24 hr tablet Take 1 tablet (100 mg total) by mouth daily. Take with or immediately following a meal. 30 tablet 1  . oxyCODONE (OXY IR/ROXICODONE) 5 MG immediate release tablet Take 1 tablet (5 mg total) by mouth every 6 (six) hours as needed for moderate pain. (Patient not taking: Reported on 09/21/2021) 20 tablet 0  . potassium chloride SA (KLOR-CON M) 20 MEQ tablet Take 1 tablet (20 mEq total) by mouth daily. (Patient not taking: Reported on 09/21/2021)    . senna-docusate (SENOKOT-S) 8.6-50 MG tablet Take 1 tablet by mouth 2 (two) times daily. (Patient not taking: Reported on 09/21/2021)    . sertraline (ZOLOFT) 25 MG tablet Take 25 mg by mouth every morning. (Patient not taking: Reported on 09/21/2021)    . spironolactone (ALDACTONE) 25 MG tablet Take 1 tablet (25 mg total) by mouth daily. 30 tablet 1   No current facility-administered medications for this visit.   No results found.  Review of Systems:   A ROS was performed including pertinent positives and negatives as documented in the HPI.  Physical Exam :   Constitutional: NAD and appears stated age Neurological: Alert and oriented Psych: Appropriate affect and cooperative There were no vitals taken for this visit.   Comprehensive Musculoskeletal Exam:    Right shoulder range of motion actively passively is to 20 degrees flexion.  External rotation at the side is to neutral.  Internal rotation of the back pocket.  Significant glenohumeral pain  Imaging:   Xray (right shoulder 3 views): Advanced glenohumeral  osteoarthritis    I personally reviewed and interpreted the radiographs.   Assessment:   56 y.o. male with advanced glenohumeral osteoarthritis.  At today's visit we did discuss the role for an ultrasound-guided injection.  He would like to proceed with this.  I will plan to see him back as needed  Plan :    -Right shoulder ultrasound-guided injection performed after verbal consent obtained    Procedure Note  Patient: Logan Nelson             Date of Birth: 1965/10/01           MRN: [id number]             Visit Date: 09/29/2021  Procedures:  Visit Diagnoses: No diagnosis found.  Large Joint Inj: R glenohumeral on 09/29/2021 9:29 AM Indications: pain Details: 22 G 1.5 in needle, ultrasound-guided anterior approach  Arthrogram: No  Medications: 4 mL lidocaine 1 %; 80 mg triamcinolone acetonide 40 MG/ML Outcome: tolerated well, no immediate complications Procedure, treatment alternatives, risks and benefits explained, specific risks discussed. Consent was given by the patient. Immediately prior to procedure a time out was called to verify the correct patient, procedure, equipment, support staff and site/side marked as required. Patient was prepped and draped in the usual sterile fashion.          I personally saw and evaluated the patient, and participated in the management and treatment plan.  Huel Cote, MD Attending Physician, Orthopedic Surgery  This document was dictated using Dragon voice recognition software. A reasonable attempt at proof reading has been made to minimize errors.

## 2021-09-29 NOTE — Telephone Encounter (Signed)
Call to patient- reviewed medication list and medications that he should have RF on. Patient states the pharmacy does not have RF. Call to pharmacy and there are 4 medications due to be filled- they will fill for patient now. Call to patient- informed Rx will be ready this afternoon- he did ask about cost and I told him he would need to contact pharmacy for cost.  Patient states he missed his appointment due to ED visit and was supposed to be rescheduled by office for follow up- patient advised I would send message to scheduling- he will be out of medication in 30 days. Patient states he will need transportation also.

## 2021-09-29 NOTE — Telephone Encounter (Signed)
Copied from CRM [id number]. Topic: General - Other >> Sep 29, 2021 12:01 PM Everette C wrote: Reason for CRM: The patient has requested to speak directly with a member of clinical staff regarding their previously discussed medications  The patient would also like to follow up on their previous request for a referral   Please contact the patient further when possible

## 2021-09-29 NOTE — Telephone Encounter (Signed)
Copied from CRM [id number]. Topic: General - Other >> Sep 29, 2021  9:32 AM Everette C wrote: Reason for CRM: The patient is out of medicine but uncertain of which medication they're in need of   Please contact the patient further when possible to review their medications  Please contact the patient further when possible  The patient has stressed the urgency of their request  On call pt states he is needing is "heart medication" but unable to provide any details regarding the RX.

## 2021-09-29 NOTE — Patient Outreach (Signed)
Care Coordination  09/29/2021  Logan Nelson 08/13/1965 [id number]   Medicaid Managed Care   Unsuccessful Outreach Note  09/29/2021 Name: Logan Nelson MRN: [id number] DOB: [birth year]  Referred by: [name], MD Reason for referral : High Risk Managed Medicaid (Mm Social work telephone outreach )   A second unsuccessful telephone outreach was attempted today. The patient was referred to the case management team for assistance with care management and care coordination.   Follow Up Plan: The care management team will reach out to the patient again over the next 7 days.   [name], BSW, [place] [facility]  [place]  High Risk Managed Medicaid Team  [phone]

## 2021-09-29 NOTE — Patient Instructions (Signed)
Visit Information  [name]  - as a part of your Medicaid benefit, you are eligible for care management and care coordination services at no cost or copay. I was unable to reach you by phone today but would be happy to help you with your health related needs. Please feel free to call me @ [phone].   A member of the Managed Medicaid care management team will reach out to you again over the next 7 days.   [name], BSW, [facility]  [place]  High Risk Managed Medicaid Team  [phone]  

## 2021-09-30 ENCOUNTER — Telehealth: Payer: Self-pay

## 2021-09-30 ENCOUNTER — Telehealth: Payer: Self-pay | Admitting: Orthopaedic Surgery

## 2021-09-30 NOTE — Telephone Encounter (Signed)
Pt called with an FYI for Dr Steward Drone. Pt states injection helped with pains and swelling but cant really move the shoulder that much. Please call pt at [phone].

## 2021-09-30 NOTE — Patient Instructions (Signed)
Visit Information  [name]  - as a part of your Medicaid benefit, you are eligible for care management and care coordination services at no cost or copay. I was unable to reach you by phone today but would be happy to help you with your health related needs. Please feel free to call me @ [phone].   A member of the Managed Medicaid care management team will reach out to you again over the next 7 days.   [name], BSW, [place] [facility]  [place]  High Risk Managed Medicaid Team  [phone]

## 2021-09-30 NOTE — Telephone Encounter (Signed)
Returned pt called. Patient said he was out of medication but he busy now,call him back and hung phone up

## 2021-09-30 NOTE — Patient Outreach (Signed)
Care Coordination  09/30/2021  Logan Nelson 25-Feb-1965 [id number]  Patient returned BSW telephone call and left BSW multiple messages. BSW contacted patient back and left a voicemail.   [name], BSW, [place] [facility]  [place]  High Risk Managed Medicaid Team  [phone]

## 2021-10-01 ENCOUNTER — Telehealth: Payer: Self-pay

## 2021-10-01 NOTE — Patient Instructions (Signed)
Visit Information  [name] was given information about Medicaid Managed Care team care coordination services as a part of their [facility] Community Plan Medicaid benefit. [name] verbally consented to engagement with the [facility] Managed Care team.   If you are experiencing a medical emergency, please call 911 or report to your local emergency department or urgent care.   If you have a non-emergency medical problem during routine business hours, please contact your provider's office and ask to speak with a nurse.   For questions related to your [facility], please call: [phone] or visit the homepage here: [email or web address]  If you would like to schedule transportation through your [facility], please call the following number at least 2 days in advance of your appointment: [phone]   Rides for urgent appointments can also be made after hours by calling Member Services.  Call the Behavioral Health Crisis Line at [phone], at any time, 24 hours a day, 7 days a week. If you are in danger or need immediate medical attention call 911.  If you would like help to quit smoking, call 1-800-QUIT-NOW ((984)816-9610) OR Espaol: 1-855-Djelo-Ya (0-626-948-5462) o para ms informacin haga clic aqu or Text READY to 703-500 to register via text  Mr. [name] - following are the goals we discussed in your visit today:   Goals Addressed   None      Social Worker will follow up in 5 days .   [name], BSW, [place] [facility]  [place]  High Risk Managed Medicaid Team  [phone]   Following is a copy of your plan of care:  There are no care plans that you recently modified to display for this patient.

## 2021-10-01 NOTE — Patient Outreach (Signed)
Medicaid Managed Care Social Work Note  10/01/2021 Name:  Logan Nelson MRN:  [id number] DOB:  10-Apr-1965  Logan Nelson is an 56 y.o. year old male who is a primary patient of [name], MD.  The Medicaid Managed Care Coordination team was consulted for assistance with:  Food Insecurity Community Resources   Logan Nelson was given information about Medicaid Managed Care Coordination team services today. Logan Nelson Patient agreed to services and verbal consent obtained.  Engaged with patient  for by telephone forinitial visit in response to referral for case management and/or care coordination services.   Assessments/Interventions:  Review of past medical history, allergies, medications, health status, including review of consultants reports, laboratory and other test data, was performed as part of comprehensive evaluation and provision of chronic care management services.  SDOH: (Social Determinant of Health) assessments and interventions performed: BSW returned patients telephone call, he stated he is currently living with his aunt and does not have any food. He has applied for foodstamps and waiting to receive his card. BSW informed patient she could provide him with food pantries and hot meals, but he states he is unable to write them down and cannot use his hand, he states he may be able to find a ride to the pantries . Patient also states he can walk to a near by church for food. Patient states he aunt leaves in the morning and does not come back until later. He states come 8/28 he and his aunt has to be out of the apartment and do not have anywhere to go. Patient then stated he may or may not have somewhere to go on the 28. He stated he has already received resources from his case manager at [facility] and has contacted housing authority. BSW informed patient housing is very slim and he can be placed on waitlist.   Advanced Directives Status:  Not addressed in this  encounter.  Care Plan                 No Known Allergies  Medications Reviewed Today     Reviewed by Veryl Speak, FNP (Family Nurse Practitioner) on 09/21/21 at [phone]  Med List Status: <None>   Medication Order Taking? Sig Documenting Provider Last Dose Status Informant  acetaminophen (TYLENOL) 325 MG tablet [id number] No Take 2 tablets (650 mg total) by mouth every 6 (six) hours as needed for mild pain ((score 1 to 3) or temp > 100.5).  Patient not taking: Reported on 09/21/2021   Zannie Cove, MD Not Taking Active   cefadroxil (DURICEF) 500 MG capsule [id number] Yes Take 1 capsule (500 mg total) by mouth 2 (two) times daily. Veryl Speak, FNP  Active   diclofenac Sodium (VOLTAREN) 1 % GEL [id number] No Apply 2 g topically 2 (two) times daily.  Patient not taking: Reported on 09/21/2021   [provider] Not Taking Active   furosemide (LASIX) 40 MG tablet [id number] No Take 1 tablet (40 mg total) by mouth daily.  Patient not taking: Reported on 09/21/2021   Rema Fendt, NP Not Taking Active   losartan (COZAAR) 25 MG tablet [id number] Yes Take 1 tablet (25 mg total) by mouth daily. Rema Fendt, NP Taking Active   metoprolol succinate (TOPROL-XL) 100 MG 24 hr tablet [id number] Yes Take 1 tablet (100 mg total) by mouth daily. Take with or immediately following a meal. Rema Fendt, NP Taking Active   oxyCODONE (OXY IR/ROXICODONE) 5 MG immediate  release tablet [id number] No Take 1 tablet (5 mg total) by mouth every 6 (six) hours as needed for moderate pain.  Patient not taking: Reported on 09/21/2021   Zannie Cove, MD Not Taking Active   potassium chloride SA (KLOR-CON M) 20 MEQ tablet [id number] No Take 1 tablet (20 mEq total) by mouth daily.  Patient not taking: Reported on 09/21/2021   Zannie Cove, MD Not Taking Active   senna-docusate (SENOKOT-S) 8.6-50 MG tablet [id number] No Take 1 tablet by mouth 2 (two) times daily.  Patient not taking: Reported on  09/21/2021   Zannie Cove, MD Not Taking Active   sertraline (ZOLOFT) 25 MG tablet [id number] No Take 25 mg by mouth every morning.  Patient not taking: Reported on 09/21/2021   [provider] Not Taking Active   spironolactone (ALDACTONE) 25 MG tablet [id number] Yes Take 1 tablet (25 mg total) by mouth daily. Rema Fendt, NP Taking Active             Patient Active Problem List   Diagnosis Date Noted   Abscess in epidural space of cervical spine 06/23/2021   Epidural abscess    Acute systolic (congestive) heart failure (HCC) 06/17/2021   Prolonged QT interval 06/17/2021   MSSA bacteremia    Pressure ulcer of sacral region, stage 3 (HCC)    Acute heart failure with preserved ejection fraction (HFpEF) (HCC) 06/13/2021   Anasarca    Pressure injury of sacral region, unstageable (HCC) 05/25/2021   Pressure injury of buttock, stage 2 (HCC) 05/14/2021   Hypomagnesemia 05/13/2021   Vitamin D deficiency 05/13/2021   Generalized weakness 05/12/2021   Hypophosphatemia 05/12/2021   Lower extremity weakness 05/11/2021   Hypokalemia 05/11/2021   Anemia of chronic disease 05/11/2021   GERD (gastroesophageal reflux disease) 05/11/2021   Bilateral inguinal hernia 04/23/2021   Protein-calorie malnutrition, severe 04/14/2021   Protein calorie malnutrition (HCC) 04/13/2021   Protein-calorie malnutrition (HCC) 04/12/2021   Hepatic steatosis 04/12/2021   Stroke (HCC) 04/11/2021   Small bowel obstruction (HCC) 10/13/2020   Anemia due to vitamin B12 deficiency 10/13/2020   Hypertension 01/09/2015   Incisional hernia 01/07/2015    Conditions to be addressed/monitored per PCP order:   community resources  There are no care plans that you recently modified to display for this patient.   Follow up:  Patient agrees to Care Plan and Follow-up.  Plan: The Managed Medicaid care management team will reach out to the patient again over the next 5 days.  Date/time of next scheduled  Social Work care management/care coordination outreach:  10/08/21  [name], [name], [facility]  [employer] [facility] [employer] [facility]  High Risk Managed Medicaid Team  [phone]

## 2021-10-08 ENCOUNTER — Other Ambulatory Visit: Payer: Self-pay

## 2021-10-08 NOTE — Patient Outreach (Signed)
Medicaid Managed Care Social Work Note  10/08/2021 Name:  Logan Nelson MRN:  [id number] DOB:  1966-02-08  Logan Nelson is an 56 y.o. year old male who is a primary patient of [name], MD.  The Medicaid Managed Care Coordination team was consulted for assistance with:  Community Resources   Mr. Mcbain was given information about Medicaid Managed Care Coordination team services today. Breckon Feliciana Rossetti Patient agreed to services and verbal consent obtained.  Engaged with patient  for by telephone forfollow up visit in response to referral for case management and/or care coordination services.   Assessments/Interventions:  Review of past medical history, allergies, medications, health status, including review of consultants reports, laboratory and other test data, was performed as part of comprehensive evaluation and provision of chronic care management services.  SDOH: (Social Determinant of Health) assessments and interventions performed: BSW completed a telephone outreach with patient, he stated he moved with his aunt to an apartment [address]. He does have a friend that is able to take him to the food pantries for food. Patient would like to live on his own. BSW will continue to work with patient for housing.   Advanced Directives Status:  Not addressed in this encounter.  Care Plan                 No Known Allergies  Medications Reviewed Today     Reviewed by Veryl Speak, FNP (Family Nurse Practitioner) on 09/21/21 at [phone]  Med List Status: <None>   Medication Order Taking? Sig Documenting Provider Last Dose Status Informant  acetaminophen (TYLENOL) 325 MG tablet [id number] No Take 2 tablets (650 mg total) by mouth every 6 (six) hours as needed for mild pain ((score 1 to 3) or temp > 100.5).  Patient not taking: Reported on 09/21/2021   Zannie Cove, MD Not Taking Active   cefadroxil (DURICEF) 500 MG capsule [id number] Yes Take 1 capsule (500  mg total) by mouth 2 (two) times daily. Veryl Speak, FNP  Active   diclofenac Sodium (VOLTAREN) 1 % GEL [id number] No Apply 2 g topically 2 (two) times daily.  Patient not taking: Reported on 09/21/2021   [provider] Not Taking Active   furosemide (LASIX) 40 MG tablet [id number] No Take 1 tablet (40 mg total) by mouth daily.  Patient not taking: Reported on 09/21/2021   Rema Fendt, NP Not Taking Active   losartan (COZAAR) 25 MG tablet [id number] Yes Take 1 tablet (25 mg total) by mouth daily. Rema Fendt, NP Taking Active   metoprolol succinate (TOPROL-XL) 100 MG 24 hr tablet [id number] Yes Take 1 tablet (100 mg total) by mouth daily. Take with or immediately following a meal. Rema Fendt, NP Taking Active   oxyCODONE (OXY IR/ROXICODONE) 5 MG immediate release tablet [id number] No Take 1 tablet (5 mg total) by mouth every 6 (six) hours as needed for moderate pain.  Patient not taking: Reported on 09/21/2021   Zannie Cove, MD Not Taking Active   potassium chloride SA (KLOR-CON M) 20 MEQ tablet [id number] No Take 1 tablet (20 mEq total) by mouth daily.  Patient not taking: Reported on 09/21/2021   Zannie Cove, MD Not Taking Active   senna-docusate (SENOKOT-S) 8.6-50 MG tablet [id number] No Take 1 tablet by mouth 2 (two) times daily.  Patient not taking: Reported on 09/21/2021   Zannie Cove, MD Not Taking Active   sertraline (ZOLOFT) 25 MG tablet [id number] No Take 25  mg by mouth every morning.  Patient not taking: Reported on 09/21/2021   [provider] Not Taking Active   spironolactone (ALDACTONE) 25 MG tablet [id number] Yes Take 1 tablet (25 mg total) by mouth daily. Rema Fendt, NP Taking Active             Patient Active Problem List   Diagnosis Date Noted   Abscess in epidural space of cervical spine 06/23/2021   Epidural abscess    Acute systolic (congestive) heart failure (HCC) 06/17/2021   Prolonged QT interval 06/17/2021   MSSA  bacteremia    Pressure ulcer of sacral region, stage 3 (HCC)    Acute heart failure with preserved ejection fraction (HFpEF) (HCC) 06/13/2021   Anasarca    Pressure injury of sacral region, unstageable (HCC) 05/25/2021   Pressure injury of buttock, stage 2 (HCC) 05/14/2021   Hypomagnesemia 05/13/2021   Vitamin D deficiency 05/13/2021   Generalized weakness 05/12/2021   Hypophosphatemia 05/12/2021   Lower extremity weakness 05/11/2021   Hypokalemia 05/11/2021   Anemia of chronic disease 05/11/2021   GERD (gastroesophageal reflux disease) 05/11/2021   Bilateral inguinal hernia 04/23/2021   Protein-calorie malnutrition, severe 04/14/2021   Protein calorie malnutrition (HCC) 04/13/2021   Protein-calorie malnutrition (HCC) 04/12/2021   Hepatic steatosis 04/12/2021   Stroke (HCC) 04/11/2021   Small bowel obstruction (HCC) 10/13/2020   Anemia due to vitamin B12 deficiency 10/13/2020   Hypertension 01/09/2015   Incisional hernia 01/07/2015    Conditions to be addressed/monitored per PCP order:   housing   There are no care plans that you recently modified to display for this patient.   Follow up:  Patient agrees to Care Plan and Follow-up.  Plan: The Managed Medicaid care management team will reach out to the patient again over the next 30 days.  Date/time of next scheduled Social Work care management/care coordination outreach:  11/08/21  [name], [name], [facility]  [facility]  High Risk Managed Medicaid Team  [phone]

## 2021-10-08 NOTE — Patient Instructions (Signed)
Visit Information  [name] was given information about Medicaid Managed Care team care coordination services as a part of their [employer] Community Plan Medicaid benefit. Logan Nelson verbally consented to engagement with the Medicaid Managed Care team.   If you are experiencing a medical emergency, please call 911 or report to your local emergency department or urgent care.   If you have a non-emergency medical problem during routine business hours, please contact your provider's office and ask to speak with a nurse.   For questions related to your [employer] Community Plan Medicaid, please call: [phone] or visit the homepage here: [email or web address]  If you would like to schedule transportation through your [employer] Community [employer] Medicaid, please call the following number at least 2 days in advance of your appointment: [phone]   Rides for urgent appointments can also be made after hours by calling Member Services.  Call the Behavioral Health Crisis Line at [phone], at any time, 24 hours a day, 7 days a week. If you are in danger or need immediate medical attention call 911.  If you would like help to quit smoking, call 1-800-QUIT-NOW (1-800-784-8669) OR Espaol: 1-855-Djelo-Ya (1-855-335-3569) o para ms informacin haga clic aqu or Text READY to 200-400 to register via text  [name] - following are the goals we discussed in your visit today:   Goals Addressed   None       Social Worker will follow up in 30 days.   Priyal Musquiz, BSW, [facility]  [place]  High Risk Managed Medicaid Team  [phone]   Following is a copy of your plan of care:  There are no care plans that you recently modified to display for this patient.   

## 2021-10-13 ENCOUNTER — Ambulatory Visit: Payer: Self-pay

## 2021-10-13 NOTE — Telephone Encounter (Signed)
  Chief Complaint: COVID exposure Symptoms: nasal congestion Frequency: 1-2 days Pertinent Negatives: Patient denies fever, SOB or any other symptoms.  Disposition: [] ED /[] Urgent Care (no appt availability in office) / [] Appointment(In office/virtual)/ []  [facility]/ [] Home Care/ [] Refused Recommended Disposition /[]  Mobile Bus/ [x]  Follow-up with PCP Additional Notes: pt states a friend he has been around or rides with tested positive for COVID. Pt is requesting to come in and get tested. Alondra, agent had canceled appt for today at 1440 with Dr. but unable to schedule d/t pt needing transportation scheduled. Advised him that I would send to practice and have someone fu on scheduling appt and transportation. Pt verbalized understanding.   Reason for Disposition  [1] COVID-19 infection suspected by caller or triager AND [2] mild symptoms (cough, fever, or others) AND [3] has not gotten tested yet  Answer Assessment - Initial Assessment Questions 1. COVID-19 DIAGNOSIS: "How do you know that you have COVID?" (e.g., positive lab test or self-test, diagnosed by doctor or NP/PA, symptoms after exposure).     Unsure  2. COVID-19 EXPOSURE: "Was there any known exposure to COVID before the symptoms began?" CDC Definition of close contact: within 6 feet (2 meters) for a total of 15 minutes or more over a 24-hour period.      he been rounding around with tested positive  3. ONSET: "When did the COVID-19 symptoms start?"      1-2 days ago  5. COUGH: "Do you have a cough?" If Yes, ask: "How bad is the cough?"       Slight cough  6. FEVER: "Do you have a fever?" If Yes, ask: "What is your temperature, how was it measured, and when did it start?"     no 7. RESPIRATORY STATUS: "Describe your breathing?" (e.g., normal; shortness of breath, wheezing, unable to speak)      Normal  9. OTHER SYMPTOMS: "Do you have any other symptoms?"  (e.g., chills, fatigue, headache, loss  of smell or taste, muscle pain, sore throat)     Nasal congestion  10. HIGH RISK DISEASE: "Do you have any chronic medical problems?" (e.g., asthma, heart or lung disease, weak immune system, obesity, etc.)       Unsure  Protocols used: Coronavirus (COVID-19) Diagnosed or Suspected-A-AH

## 2021-10-13 NOTE — Telephone Encounter (Signed)
Patient was called regarding COVID exposure triage note. Patient called  me a idiot and told me I did not know how to do my job. He said that he called me an appointment . Patient was very rude,loud, and disrespectful.

## 2021-10-14 ENCOUNTER — Ambulatory Visit (INDEPENDENT_AMBULATORY_CARE_PROVIDER_SITE_OTHER): Payer: Medicaid Other | Admitting: Orthopaedic Surgery

## 2021-10-14 DIAGNOSIS — M25511 Pain in right shoulder: Secondary | ICD-10-CM | POA: Diagnosis not present

## 2021-10-14 DIAGNOSIS — G8929 Other chronic pain: Secondary | ICD-10-CM | POA: Diagnosis not present

## 2021-10-14 MED ORDER — LIDOCAINE HCL 1 % IJ SOLN
4.0000 mL | INTRAMUSCULAR | Status: AC | PRN
  Administered 2021-10-14: 4 mL

## 2021-10-14 MED ORDER — TRIAMCINOLONE ACETONIDE 40 MG/ML IJ SUSP
80.0000 mg | INTRAMUSCULAR | Status: AC | PRN
  Administered 2021-10-14: 80 mg via INTRA_ARTICULAR

## 2021-10-14 NOTE — Progress Notes (Signed)
Chief Complaint: Right shoulder pain     History of Present Illness:   10/14/2021: Presents today for follow-up of his right shoulder.  Overall he only got approximately 1 day of relief.  He is hoping to discuss further treatment options as he is significantly having right shoulder pain.  Logan Nelson is a 56 y.o. male right-hand-dominant presents with right shoulder pain which has now been ongoing for several years.  He states that he has very limited overhead activity.  He recently had an infection drained in his neck.  He is here today as a referral from Dr. Warren Danes office for right shoulder ultrasound-guided injection    Surgical History:   None  PMH/PSH/Family History/Social History/Meds/Allergies:    Past Medical History:  Diagnosis Date   Anemia    Arthritis    Bradycardia    Dyspnea    Falling episodes    GERD (gastroesophageal reflux disease)    Hernia of abdominal wall    Hypertension    Lower extremity edema    Past Surgical History:  Procedure Laterality Date   ABDOMINAL SURGERY     ANTERIOR CERVICAL DECOMP/DISCECTOMY FUSION N/A 06/23/2021   Procedure: Anterior cervical Decompression and epidural Abcess evacuation with arthodesis cervical Four -five;  Surgeon: Coletta Memos, MD;  Location: [facility] OR;  Service: Neurosurgery;  Laterality: N/A;   INGUINAL HERNIA REPAIR Right 04/30/2021   Procedure: HERNIA REPAIR INGUINAL WITH MESH;  Surgeon: Stechschulte, Hyman Hopes, MD;  Location: [facility] OR;  Service: General;  Laterality: Right;   LYSIS OF ADHESION  04/30/2021   Procedure: LYSIS OF ADHESION;  Surgeon: Quentin Ore, MD;  Location: [facility] OR;  Service: General;;   RADIOLOGY WITH ANESTHESIA N/A 06/16/2021   Procedure: MRI CERVICAL THORACIC LUMBER AND SACRUM;  Surgeon: Radiologist, Medication, MD;  Location: [facility] OR;  Service: Radiology;  Laterality: N/A;   RADIOLOGY WITH ANESTHESIA N/A 06/18/2021   Procedure: MRI WITH ANESTHESIA;  Surgeon:  Radiologist, Medication, MD;  Location: [facility] OR;  Service: Radiology;  Laterality: N/A;   RADIOLOGY WITH ANESTHESIA N/A 06/20/2021   Procedure: MRI WITH ANESTHESIA;  Surgeon: Radiologist, Medication, MD;  Location: [facility] OR;  Service: Radiology;  Laterality: N/A;   UMBILICAL HERNIA REPAIR N/A 04/30/2021   Procedure: OPEN UMBILICAL AND EPIGASTRIC HERNIA REPAIR;  Surgeon: Quentin Ore, MD;  Location: [facility] OR;  Service: General;  Laterality: N/A;   Social History   Socioeconomic History   Marital status: Single    Spouse name: Not on file   Number of children: 1   Years of education: Not on file   Highest education level: High school graduate  Occupational History    Comment: pending disability  Tobacco Use   Smoking status: Former    Types: Cigarettes    Quit date: 04/28/2020    Years since quitting: 1.4   Smokeless tobacco: Never  Vaping Use   Vaping Use: Never used  Substance and Sexual Activity   Alcohol use: Not Currently    Comment: before admission   Drug use: Not Currently    Types: Cocaine    Comment: past history   Sexual activity: Not Currently  Other Topics Concern   Not on file  Social History Narrative   ** Merged History Encounter **       Social Determinants of Health  Financial Resource Strain: Medium Risk (10/01/2021)   Overall Financial Resource Strain (CARDIA)    Difficulty of Paying Living Expenses: Somewhat hard  Food Insecurity: No Food Insecurity (10/01/2021)   Hunger Vital Sign    Worried About Running Out of Food in the Last Year: Never true    Ran Out of Food in the Last Year: Never true  Transportation Needs: No Transportation Needs (07/02/2021)   PRAPARE - Administrator, Civil Service (Medical): No    Lack of Transportation (Non-Medical): No  Physical Activity: Not on file  Stress: Not on file  Social Connections: Not on file   No family history on file. No Known Allergies Current Outpatient Medications  Medication Sig  Dispense Refill   acetaminophen (TYLENOL) 325 MG tablet Take 2 tablets (650 mg total) by mouth every 6 (six) hours as needed for mild pain ((score 1 to 3) or temp > 100.5). (Patient not taking: Reported on 09/21/2021)     cefadroxil (DURICEF) 500 MG capsule Take 1 capsule (500 mg total) by mouth 2 (two) times daily. 60 capsule 0   diclofenac Sodium (VOLTAREN) 1 % GEL Apply 2 g topically 2 (two) times daily. (Patient not taking: Reported on 09/21/2021)     furosemide (LASIX) 40 MG tablet Take 1 tablet (40 mg total) by mouth daily. (Patient not taking: Reported on 09/21/2021) 30 tablet 1   losartan (COZAAR) 25 MG tablet Take 1 tablet (25 mg total) by mouth daily. 30 tablet 1   metoprolol succinate (TOPROL-XL) 100 MG 24 hr tablet Take 1 tablet (100 mg total) by mouth daily. Take with or immediately following a meal. 30 tablet 1   oxyCODONE (OXY IR/ROXICODONE) 5 MG immediate release tablet Take 1 tablet (5 mg total) by mouth every 6 (six) hours as needed for moderate pain. (Patient not taking: Reported on 09/21/2021) 20 tablet 0   potassium chloride SA (KLOR-CON M) 20 MEQ tablet Take 1 tablet (20 mEq total) by mouth daily. (Patient not taking: Reported on 09/21/2021)     senna-docusate (SENOKOT-S) 8.6-50 MG tablet Take 1 tablet by mouth 2 (two) times daily. (Patient not taking: Reported on 09/21/2021)     sertraline (ZOLOFT) 25 MG tablet Take 25 mg by mouth every morning. (Patient not taking: Reported on 09/21/2021)     spironolactone (ALDACTONE) 25 MG tablet Take 1 tablet (25 mg total) by mouth daily. 30 tablet 1   No current facility-administered medications for this visit.   No results found.  Review of Systems:   A ROS was performed including pertinent positives and negatives as documented in the HPI.  Physical Exam :   Constitutional: NAD and appears stated age Neurological: Alert and oriented Psych: Appropriate affect and cooperative There were no vitals taken for this visit.   Comprehensive  Musculoskeletal Exam:    Right shoulder range of motion actively passively is to 20 degrees flexion.  External rotation at the side is to neutral.  Internal rotation of the back pocket.  Significant glenohumeral pain  Imaging:   Xray (right shoulder 3 views): Advanced glenohumeral osteoarthritis    I personally reviewed and interpreted the radiographs.   Assessment:   56 y.o. male with advanced glenohumeral osteoarthritis.  I did describe that overall 1 day of relief I do not believe that it was likely to be in his glenohumeral joint as I would typically see at a minimum of 3 weeks of relief.  At this visit I have discussed the possibility of an  additional injection into the right shoulder to hopefully get him some longer lasting relief.  He would like to proceed with this.  Plan :    -Right shoulder ultrasound-guided injection performed after verbal consent obtained    Procedure Note  Patient: Logan Nelson             Date of Birth: 08-Jan-1966           MRN: [id number]             Visit Date: 10/14/2021  Procedures: Visit Diagnoses: No diagnosis found.  Large Joint Inj: R glenohumeral on 10/14/2021 11:00 AM Indications: pain Details: 22 G 1.5 in needle, ultrasound-guided anterior approach  Arthrogram: No  Medications: 4 mL lidocaine 1 %; 80 mg triamcinolone acetonide 40 MG/ML Outcome: tolerated well, no immediate complications Procedure, treatment alternatives, risks and benefits explained, specific risks discussed. Consent was given by the patient. Immediately prior to procedure a time out was called to verify the correct patient, procedure, equipment, support staff and site/side marked as required. Patient was prepped and draped in the usual sterile fashion.          I personally saw and evaluated the patient, and participated in the management and treatment plan.  Huel Cote, MD Attending Physician, Orthopedic Surgery  This document was dictated using  Dragon voice recognition software. A reasonable attempt at proof reading has been made to minimize errors.

## 2021-10-19 ENCOUNTER — Telehealth: Payer: Self-pay

## 2021-10-19 NOTE — Telephone Encounter (Signed)
Patient called office stating he had missed  call from office and was following up. Informed patient that it was a reminder call for his upcoming appointment on 9/13. He would like to cancel appointment since he does not have any transportation to get to visit. Offered bus passes to patient, but he refused. Will call office back to reschedule. Juanita Laster, RMA

## 2021-10-20 ENCOUNTER — Ambulatory Visit: Payer: Medicaid Other | Admitting: Family

## 2021-10-26 ENCOUNTER — Other Ambulatory Visit: Payer: Self-pay | Admitting: Family

## 2021-10-26 DIAGNOSIS — I5021 Acute systolic (congestive) heart failure: Secondary | ICD-10-CM

## 2021-10-26 DIAGNOSIS — M25511 Pain in right shoulder: Secondary | ICD-10-CM | POA: Diagnosis not present

## 2021-10-26 DIAGNOSIS — G061 Intraspinal abscess and granuloma: Secondary | ICD-10-CM | POA: Diagnosis not present

## 2021-10-26 DIAGNOSIS — I4891 Unspecified atrial fibrillation: Secondary | ICD-10-CM

## 2021-11-02 ENCOUNTER — Telehealth: Payer: Self-pay | Admitting: Licensed Clinical Social Worker

## 2021-11-02 NOTE — Telephone Encounter (Signed)
Copied from [place] [id number]. Topic: General - Other >> Nov 02, 2021  2:51 PM [place] B wrote: Reason for [id number] trying to reschedule appt with social worker

## 2021-11-08 ENCOUNTER — Ambulatory Visit: Payer: Medicaid Other

## 2021-11-08 NOTE — Progress Notes (Signed)
**Note Logan-Identified via Obfuscation** Cardiology Office Note:    Date:  11/09/2021   ID:  Logan Nelson, DOB 1965/10/04, MRN [id number]  PCP:  [name], MD   [facility] Providers Cardiologist:  Lenna Sciara, MD Referring MD: Camillia Herter, NP   Chief Complaint/Reason for Referral: Bradycardia  ASSESSMENT:    1. Cardiomyopathy, unspecified type ([place])   2. Mitral valve insufficiency, unspecified etiology   3. Paroxysmal atrial fibrillation ([facility])   4. Aortic atherosclerosis ([place])   5. Hyperlipidemia LDL goal <70    PATIENT DID NOT APPEAR FOR APPOINTMENT   PLAN:    In order of problems listed above: 1.  Cardiomyopathy: 2.  Mitral regurgitation: TEE was deferred back in May when the patient had just underwent cervical spine surgery. 3.  Paroxysmal atrial fibrillation: 4.  Aortic atherosclerosis: We will start aspirin 81 mg and atorvastatin 40 mg.   5.  Hyperlipidemia:  Check lipid panel, LFTs, and LP(a) in 2 months.  Goal LDL is less than 70.             Dispo:  No follow-ups on file.      Medication Adjustments/Labs and Tests Ordered: Current medicines are reviewed at length with the patient today.  Concerns regarding medicines are outlined above.  The following changes have been made:    Labs/tests ordered: No orders of the defined types were placed in this encounter.   Medication Changes: No orders of the defined types were placed in this encounter.    Current medicines are reviewed at length with the patient today.  The patient  concerns regarding medicines.   History of Present Illness:    FOCUSED PROBLEM LIST:   1.  Hypertension 2.  GERD 3.  Cardiomyopathy with ejection fraction of 30 to 35% with moderate mitral regurgitation 4.  History of cocaine and cigarette abuse 5.  Aortic atherosclerosis on CT scan abdomen pelvis 2023 6.  Paroxysmal atrial fibrillation and atrial flutter of short duration; CV 2 score 1 not on anticoagulation due to medical noncompliance 7.   History of MSSA bacteremia  November 2022 consultation: Logan Nelson is a 55 y.o. male with the indicated history here for recommendations regarding bradycardia. The patient saw his PCP recently prior to an ED presentation.  At that appointment, his HR was noted to be in the high 50s.  Patient then presented to the emergency department with abdominal pain.  He has a history of abdominal hernias.  He was evaluated by Dr. Kristie Cowman of general surgery and ultimately discharged with plans for outpatient surgery follow-up.  In terms of symptoms the patient tells me that when his hernia hurts him he will get lower chest pain.  He is not able to specify the character of his pain after questioning.  He also tells me he gets short of breath but it is unclear whether this occurs due to abdominal pain or separately.  He tells me that he has been having abdominal pain chest pain and shortness of breath every day for a long long time.  It does not seem to be associated with exertion or worse with exertion but again he is unable to really specify this for me.  He denies any severe bleeding or bruising, presyncope, syncope, palpitations, or paroxysmal nocturnal dyspnea.  He has had no edema.  Plan: Obtain stress echocardiogram given precordial pain and dyspnea.  Today: The patient was admitted in March due to poor nutritional status and found to have a large umbilical hernia and  a right inguinal hernia.  Plans for him to follow-up with general surgery made.  He underwent ventral hernia repair in March but apparently left AMA.  An echocardiogram during that admission possible severe mitral regurgitation with preserved ejection fraction.  He was then admitted with bilateral lower extremity weakness.  He did by an accident as a bacteremia.  An echocardiogram at that time demonstrated an ejection fraction of 30% and a possible vegetation with moderate mitral regurgitation.  The patient left AMA.  He presented the next  day after a fall.  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  Cultures grew out MSSA.  He was seen by cardiology and a TEE was deferred.  He did have short duration atrial fibrillation and flutter however given the CV score of 1, recent neurosurgery, as well as noncompliance with medical therapy, anticoagulation was deferred.  He was started on metoprolol 50 mg twice daily and diuresis.  He was seen in follow-up in early August.  He was only taking 3 medications specify which ones.  He was still smoking at that time as well.  His spironolactone, losartan, and metoprolol were refilled.  Noted to be tachycardic with regular rhythm no EKG was done.       Current Medications: No outpatient medications have been marked as taking for the 11/09/21 encounter (Office Visit) with Early Osmond, MD.     Allergies:    Patient has no known allergies.   Social History:   Social History   Tobacco Use   Smoking status: Former    Types: Cigarettes    Quit date: 04/28/2020    Years since quitting: 1.5   Smokeless tobacco: Never  Vaping Use   Vaping Use: Never used  Substance Use Topics   Alcohol use: Not Currently    Comment: before admission   Drug use: Not Currently    Types: Cocaine    Comment: past history     Family Hx: No family history on file.   Review of Systems:   Please see the history of present illness.    All other systems reviewed and are negative.     EKGs/Labs/Other Test Reviewed:    EKG:  EKG performed May 2023 that I personally reviewed demonstrates atrial flutter  Prior CV studies:   TTE May 2023:  1. There is a mobile filamentous structure in the RA. 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 No valvular lesions are present to suggest endocarditis.  Of note, this structure was present on the prior echo from March 2023.   2. LV myocardium is hyper trabeculated. Consider  contrast echo or MRI to  exclude noncompaction. Left ventricular ejection fraction, by estimation,  is 30 to 35%. The left ventricle has moderately decreased function. The  left ventricle demonstrates global   hypokinesis. Indeterminate diastolic filling due to E-A fusion.   3. Right ventricular systolic function is low normal. The right  ventricular size is normal. There is normal pulmonary artery systolic  pressure. The estimated right ventricular systolic pressure is [id number] mmHg.   4. Left atrial size was severely dilated.   5. Mild to moderate mitral regurgitation is present on this study. The  prior study demonstrated possible moderate to severe MR. Would recommend  TEE for clarification. The mitral valve is grossly normal. Mild to  moderate mitral valve regurgitation. No  evidence of mitral stenosis.   6. The aortic valve is  tricuspid. There is mild calcification of the  aortic valve. Aortic valve regurgitation is not visualized. Aortic valve  sclerosis is present, with no evidence of aortic valve stenosis.   7. The inferior vena cava is normal in size with greater than 50%  respiratory variability, suggesting right atrial pressure of 3 mmHg.   TTE March 2023:  1. Left ventricular ejection fraction, by estimation, is 60 to 65%. The  left ventricle has normal function. The left ventricle has no regional  wall motion abnormalities. There is mild left ventricular hypertrophy.  Left ventricular diastolic parameters  were normal.   2. Right ventricular systolic function is normal. The right ventricular  size is normal. There is normal pulmonary artery systolic pressure. The  estimated right ventricular systolic pressure is 54.6 mmHg.   3. Left atrial size was mildly dilated.   4. Cannot exclude a flail A2 leaflet. The mitral valve is myxomatous.  Moderate to severe mitral valve regurgitation. There is moderate late  systolic prolapse of the middle segment of the anterior leaflet of the   mitral valve.   5. The aortic valve is tricuspid. Aortic valve regurgitation is not  visualized.   6. Aortic dilatation noted. There is borderline dilatation of the aortic  root, measuring 38 mm.   7. The inferior vena cava is normal in size with greater than 50%  respiratory variability, suggesting right atrial pressure of 3 mmHg.   DSE 2022:  1. Baseline echo with normal LV function, mild MR/TR/PI; mild LAE, RAE  and RVE; no stress-induced wall motion abnormalities with dobutamine  infusion; no ischemia noteed.   2. This is a negative stress echocardiogram for ischemia.   3. This is a low risk study.   Other studies Reviewed: Review of the additional studies/records demonstrates: CT abdomen pelvis 2023 with aortic atherosclerosis  Recent Labs: 05/13/2021: TSH 2.975 06/23/2021: ALT 13 06/28/2021: B Natriuretic Peptide >4,500.0 07/02/2021: Hemoglobin 9.5; Platelets 351 07/03/2021: Magnesium 1.6 07/04/2021: BUN 15; Creatinine, Ser 0.44; Potassium 4.6; Sodium 138   Recent Lipid Panel Lab Results  Component Value Date/Time   CHOL 62 04/23/2021 06:01 PM   TRIG 41 05/03/2021 04:35 AM   HDL 26 (L) 04/23/2021 06:01 PM   LDLCALC 26 04/23/2021 06:01 PM    Risk Assessment/Calculations:     CHA2DS2-VASc Score = 1   This indicates a 0.6% annual risk of stroke. The patient's score is based upon: CHF History: 1 HTN History: 0 Diabetes History: 0 Stroke History: 0 Vascular Disease History: 0 Age Score: 0 Gender Score: 0            Physical Exam:    VS:  There were no vitals taken for this visit.   Wt Readings from Last 3 Encounters:  09/21/21 178 lb 3.2 oz (80.8 kg)  09/08/21 176 lb (79.8 kg)  09/03/21 172 lb (78 kg)      Signed, Early Osmond, MD  11/09/2021 11:19 AM    [address], [place], [place]  [address] Phone: [phone]; Fax: [phone]   Note:  This document was prepared using Dragon voice recognition software  and may include unintentional dictation errors.

## 2021-11-09 ENCOUNTER — Ambulatory Visit (INDEPENDENT_AMBULATORY_CARE_PROVIDER_SITE_OTHER): Payer: Medicaid Other | Admitting: Internal Medicine

## 2021-11-09 DIAGNOSIS — E785 Hyperlipidemia, unspecified: Secondary | ICD-10-CM

## 2021-11-09 DIAGNOSIS — I429 Cardiomyopathy, unspecified: Secondary | ICD-10-CM

## 2021-11-09 DIAGNOSIS — I480 Paroxysmal atrial fibrillation: Secondary | ICD-10-CM

## 2021-11-09 DIAGNOSIS — I340 Nonrheumatic mitral (valve) insufficiency: Secondary | ICD-10-CM

## 2021-11-09 DIAGNOSIS — I700 Atherosclerosis of aorta: Secondary | ICD-10-CM

## 2021-11-11 ENCOUNTER — Other Ambulatory Visit: Payer: Self-pay

## 2021-11-11 NOTE — Patient Outreach (Signed)
Care Coordination  11/11/2021  Wyman Sabastien Tyler 1965-05-06 [id number]    Medicaid Managed Care   Unsuccessful Outreach Note  11/11/2021 Name: Logan Nelson MRN: [id number] DOB: 1965-12-25  Referred by: [name], MD Reason for referral : High Risk Managed Medicaid (MM Social Work [employer] )   An unsuccessful telephone outreach was attempted today. The patient was referred to the case management team for assistance with care management and care coordination.   Follow Up Plan: The care management team will reach out to the patient again over the next 30 days.   Mickel Fuchs, BSW, [place] Managed Medicaid Team  [phone]

## 2021-11-11 NOTE — Patient Instructions (Signed)
Visit Information  [name]  - as a part of your Medicaid benefit, you are eligible for care management and care coordination services at no cost or copay. I was unable to reach you by phone today but would be happy to help you with your health related needs. Please feel free to call me @ [phone]).   A member of the Managed Medicaid care management team will reach out to you again over the next 30 days.   [name], BSW, MHA [facility]  [place]  High Risk Managed Medicaid Team  [phone]  

## 2021-11-18 ENCOUNTER — Ambulatory Visit (HOSPITAL_BASED_OUTPATIENT_CLINIC_OR_DEPARTMENT_OTHER): Payer: Medicaid Other | Admitting: Orthopaedic Surgery

## 2021-11-22 DIAGNOSIS — M25511 Pain in right shoulder: Secondary | ICD-10-CM | POA: Diagnosis not present

## 2021-11-23 NOTE — Telephone Encounter (Signed)
LCSWA called patient today to introduce herself and to assess patients' mental health needs. Patient did not answer the phone. LCSWA was not able to leave a message.

## 2021-12-01 ENCOUNTER — Ambulatory Visit (INDEPENDENT_AMBULATORY_CARE_PROVIDER_SITE_OTHER): Payer: Medicaid Other | Admitting: Orthopaedic Surgery

## 2021-12-01 DIAGNOSIS — M19011 Primary osteoarthritis, right shoulder: Secondary | ICD-10-CM | POA: Diagnosis not present

## 2021-12-01 DIAGNOSIS — M25511 Pain in right shoulder: Secondary | ICD-10-CM | POA: Diagnosis not present

## 2021-12-01 DIAGNOSIS — G8929 Other chronic pain: Secondary | ICD-10-CM | POA: Diagnosis not present

## 2021-12-01 MED ORDER — LIDOCAINE HCL 1 % IJ SOLN
4.0000 mL | INTRAMUSCULAR | Status: AC | PRN
  Administered 2021-12-01: 4 mL

## 2021-12-01 MED ORDER — TRIAMCINOLONE ACETONIDE 40 MG/ML IJ SUSP
80.0000 mg | INTRAMUSCULAR | Status: AC | PRN
  Administered 2021-12-01: 80 mg via INTRA_ARTICULAR

## 2021-12-01 NOTE — Progress Notes (Signed)
Chief Complaint: Right shoulder pain     History of Present Illness:   12/01/2021: Right shoulder osteoarthritis.  He presents today for follow-up and injection.  He states that his motion is improved since last injection.  He is here today seeking an additional injection.  Logan Nelson is a 56 y.o. male right-hand-dominant presents with right shoulder pain which has now been ongoing for several years.  He states that he has very limited overhead activity.  He recently had an infection drained in his neck.  He is here today as a referral from Dr. Phoebe Sharps office for right shoulder ultrasound-guided injection    Surgical History:   None  PMH/PSH/Family History/Social History/Meds/Allergies:    Past Medical History:  Diagnosis Date   Anemia    Arthritis    Bradycardia    Dyspnea    Falling episodes    GERD (gastroesophageal reflux disease)    Hernia of abdominal wall    Hypertension    Lower extremity edema    Past Surgical History:  Procedure Laterality Date   ABDOMINAL SURGERY     ANTERIOR CERVICAL DECOMP/DISCECTOMY FUSION N/A 06/23/2021   Procedure: Anterior cervical Decompression and epidural Abcess evacuation with arthodesis cervical Four -five;  Surgeon: Ashok Pall, MD;  Location: [place];  Service: Neurosurgery;  Laterality: N/A;   INGUINAL HERNIA REPAIR Right 04/30/2021   Procedure: HERNIA REPAIR INGUINAL WITH MESH;  Surgeon: Stechschulte, Nickola Major, MD;  Location: [place];  Service: General;  Laterality: Right;   LYSIS OF ADHESION  04/30/2021   Procedure: LYSIS OF ADHESION;  Surgeon: Felicie Morn, MD;  Location: [place];  Service: General;;   RADIOLOGY WITH ANESTHESIA N/A 06/16/2021   Procedure: MRI CERVICAL THORACIC LUMBER AND SACRUM;  Surgeon: Radiologist, Medication, MD;  Location: [place];  Service: Radiology;  Laterality: N/A;   RADIOLOGY WITH ANESTHESIA N/A 06/18/2021   Procedure: MRI WITH ANESTHESIA;  Surgeon: Radiologist,  Medication, MD;  Location: [place];  Service: Radiology;  Laterality: N/A;   RADIOLOGY WITH ANESTHESIA N/A 06/20/2021   Procedure: MRI WITH ANESTHESIA;  Surgeon: Radiologist, Medication, MD;  Location: [place];  Service: Radiology;  Laterality: N/A;   UMBILICAL HERNIA REPAIR N/A 04/30/2021   Procedure: OPEN UMBILICAL AND EPIGASTRIC HERNIA REPAIR;  Surgeon: Felicie Morn, MD;  [facility];  Service: General;  Laterality: N/A;   Social History   Socioeconomic History   Marital status: Single    Spouse name: Not on file   Number of children: 1   Years of education: Not on file   Highest education level: High school graduate  Occupational History    Comment: pending disability  Tobacco Use   Smoking status: Former    Types: Cigarettes    Quit date: 04/28/2020    Years since quitting: 1.5   Smokeless tobacco: Never  Vaping Use   Vaping Use: Never used  Substance and Sexual Activity   Alcohol use: Not Currently    Comment: before admission   Drug use: Not Currently    Types: Cocaine    Comment: past history   Sexual activity: Not Currently  Other Topics Concern   Not on file  Social History Narrative   ** Merged History Encounter **       Social Determinants of Radio broadcast assistant  Strain: Medium Risk (10/01/2021)   Overall Financial Resource Strain (CARDIA)    Difficulty of Paying Living Expenses: Somewhat hard  Food Insecurity: No Food Insecurity (10/01/2021)   Hunger Vital Sign    Worried About Running Out of Food in the Last Year: Never true    [place] in the Last Year: Never true  Transportation Needs: No Transportation Needs (07/02/2021)   PRAPARE - Hydrologist (Medical): No    Lack of Transportation (Non-Medical): No  Physical Activity: Not on file  Stress: Not on file  Social Connections: Not on file   No family history on file. No Known Allergies Current Outpatient Medications  Medication Sig Dispense Refill    acetaminophen (TYLENOL) 325 MG tablet Take 2 tablets (650 mg total) by mouth every 6 (six) hours as needed for mild pain ((score 1 to 3) or temp > 100.5). (Patient not taking: Reported on 09/21/2021)     cefadroxil (DURICEF) 500 MG capsule Take 1 capsule (500 mg total) by mouth 2 (two) times daily. 60 capsule 0   diclofenac Sodium (VOLTAREN) 1 % GEL Apply 2 g topically 2 (two) times daily. (Patient not taking: Reported on 09/21/2021)     furosemide (LASIX) 40 MG tablet Take 1 tablet (40 mg total) by mouth daily. (Patient not taking: Reported on 09/21/2021) 30 tablet 1   losartan (COZAAR) 25 MG tablet Take 1 tablet (25 mg total) by mouth daily. 30 tablet 1   metoprolol succinate (TOPROL-XL) 100 MG 24 hr tablet Take 1 tablet (100 mg total) by mouth daily. Take with or immediately following a meal. 30 tablet 1   oxyCODONE (OXY IR/ROXICODONE) 5 MG immediate release tablet Take 1 tablet (5 mg total) by mouth every 6 (six) hours as needed for moderate pain. (Patient not taking: Reported on 09/21/2021) 20 tablet 0   potassium chloride SA (KLOR-CON M) 20 MEQ tablet Take 1 tablet (20 mEq total) by mouth daily. (Patient not taking: Reported on 09/21/2021)     senna-docusate (SENOKOT-S) 8.6-50 MG tablet Take 1 tablet by mouth 2 (two) times daily. (Patient not taking: Reported on 09/21/2021)     sertraline (ZOLOFT) 25 MG tablet Take 25 mg by mouth every morning. (Patient not taking: Reported on 09/21/2021)     spironolactone (ALDACTONE) 25 MG tablet Take 1 tablet (25 mg total) by mouth daily. 30 tablet 1   No current facility-administered medications for this visit.   No results found.  Review of Systems:   A ROS was performed including pertinent positives and negatives as documented in the HPI.  Physical Exam :   Constitutional: NAD and appears stated age Neurological: Alert and oriented Psych: Appropriate affect and cooperative There were no vitals taken for this visit.   Comprehensive Musculoskeletal Exam:     Right shoulder range of motion actively passively is to 20 degrees flexion.  External rotation at the side is to neutral.  Internal rotation of the back pocket.  Significant glenohumeral pain  Imaging:   Xray (right shoulder 3 views): Advanced glenohumeral osteoarthritis    I personally reviewed and interpreted the radiographs.   Assessment:   56 y.o. male with advanced glenohumeral osteoarthritis.  Unfortunately at this time I described that I do not necessarily believe he would be a candidate for shoulder arthroplasty.  That effect I do believe that as needed injections are ideal for him.  He would like 1 of these today.  Plan :    -Right  shoulder ultrasound-guided injection performed after verbal consent obtained    Procedure Note  Patient: Logan Nelson             Date of Birth: 1965/08/02           MRN: [id number]             Visit Date: 12/01/2021  Procedures: Visit Diagnoses: No diagnosis found.  Large Joint Inj: R glenohumeral on 12/01/2021 9:50 AM Indications: pain Details: 22 G 1.5 in needle, ultrasound-guided anterior approach  Arthrogram: No  Medications: 4 mL lidocaine 1 %; 80 mg triamcinolone acetonide 40 MG/ML Outcome: tolerated well, no immediate complications Procedure, treatment alternatives, risks and benefits explained, specific risks discussed. Consent was given by the patient. Immediately prior to procedure a time out was called to verify the correct patient, procedure, equipment, support staff and site/side marked as required. Patient was prepped and draped in the usual sterile fashion.          I personally saw and evaluated the patient, and participated in the management and treatment plan.  Vanetta Mulders, MD Attending Physician, Orthopedic Surgery  This document was dictated using Dragon voice recognition software. A reasonable attempt at proof reading has been made to minimize errors.

## 2021-12-02 ENCOUNTER — Other Ambulatory Visit (HOSPITAL_COMMUNITY): Payer: Self-pay | Admitting: Neurosurgery

## 2021-12-02 DIAGNOSIS — G061 Intraspinal abscess and granuloma: Secondary | ICD-10-CM

## 2021-12-02 DIAGNOSIS — M25511 Pain in right shoulder: Secondary | ICD-10-CM

## 2021-12-10 ENCOUNTER — Other Ambulatory Visit: Payer: Self-pay

## 2021-12-10 NOTE — Patient Outreach (Signed)
Care Coordination  12/10/2021  Logan Nelson 04/18/1965 [id number]    Medicaid Managed Care   Unsuccessful Outreach Note  12/10/2021 Name: Logan Nelson MRN: [id number] DOB: 10-05-1965  Referred by: [name], MD Reason for referral : High Risk Managed Medicaid (MM Social work telephone outreach )   A second unsuccessful telephone outreach was attempted today. The patient was referred to the case management team for assistance with care management and care coordination.   Follow Up Plan: A HIPAA compliant phone message was left for the patient providing contact information and requesting a return call.   Mickel Fuchs, BSW, [place] Managed Medicaid Team  [phone]

## 2021-12-10 NOTE — Patient Instructions (Signed)
Visit Information  Mr. Less Woolsey  - as a part of your Medicaid benefit, you are eligible for care management and care coordination services at no cost or copay. I was unable to reach you by phone today but would be happy to help you with your health related needs. Please feel free to call me @ [phone]).   A member of the Managed Medicaid care management team will reach out to you again over the next 30 days.   Mickel Fuchs, BSW, [place] Managed Medicaid Team  [phone]

## 2021-12-21 ENCOUNTER — Ambulatory Visit (INDEPENDENT_AMBULATORY_CARE_PROVIDER_SITE_OTHER): Payer: Medicaid Other | Admitting: Family Medicine

## 2021-12-21 ENCOUNTER — Telehealth: Payer: Self-pay

## 2021-12-21 ENCOUNTER — Encounter: Payer: Self-pay | Admitting: Family Medicine

## 2021-12-21 ENCOUNTER — Telehealth: Payer: Self-pay | Admitting: Family Medicine

## 2021-12-21 VITALS — BP 133/89 | HR 73 | Temp 98.1°F | Resp 16 | Wt 219.0 lb

## 2021-12-21 DIAGNOSIS — I5021 Acute systolic (congestive) heart failure: Secondary | ICD-10-CM

## 2021-12-21 DIAGNOSIS — I4891 Unspecified atrial fibrillation: Secondary | ICD-10-CM

## 2021-12-21 DIAGNOSIS — I10 Essential (primary) hypertension: Secondary | ICD-10-CM | POA: Diagnosis not present

## 2021-12-21 DIAGNOSIS — F32A Depression, unspecified: Secondary | ICD-10-CM

## 2021-12-21 DIAGNOSIS — F419 Anxiety disorder, unspecified: Secondary | ICD-10-CM | POA: Diagnosis not present

## 2021-12-21 MED ORDER — SPIRONOLACTONE 25 MG PO TABS: 25.0000 mg | ORAL_TABLET | Freq: Every day | ORAL | 1 refills | Status: DC

## 2021-12-21 MED ORDER — LOSARTAN POTASSIUM 25 MG PO TABS: 25.0000 mg | ORAL_TABLET | Freq: Every day | ORAL | 1 refills | Status: DC

## 2021-12-21 MED ORDER — METOPROLOL SUCCINATE ER 100 MG PO TB24: 100.0000 mg | ORAL_TABLET | Freq: Every day | ORAL | 1 refills | Status: DC

## 2021-12-21 MED ORDER — SERTRALINE HCL 25 MG PO TABS: 25.0000 mg | ORAL_TABLET | Freq: Every morning | ORAL | 1 refills | Status: DC

## 2021-12-21 NOTE — Telephone Encounter (Signed)
Paitent maybe be late for his appt, His transportation from [name] hasn't show up yet.

## 2021-12-21 NOTE — Patient Instructions (Signed)
Visit Information  Logan Nelson was given information about Medicaid Managed Care team care coordination services as a part of their [facility] Community Plan Medicaid benefit. Logan Nelson verbally consented to engagement with the [facility] Managed Care team.   If you are experiencing a medical emergency, please call 911 or report to your local emergency department or urgent care.   If you have a non-emergency medical problem during routine business hours, please contact your provider's office and ask to speak with a nurse.   For questions related to your [facility], please call: [phone] or visit the homepage here: [email or web address]  If you would like to schedule transportation through your [facility], please call the following number at least 2 days in advance of your appointment: [phone]   Rides for urgent appointments can also be made after hours by calling Member Services.  Call the Behavioral Health Crisis Line at [phone], at any time, 24 hours a day, 7 days a week. If you are in danger or need immediate medical attention call 911.  If you would like help to quit smoking, call 1-800-QUIT-NOW ((470)191-8119) OR Espaol: 1-855-Djelo-Ya (3-094-076-8088) o para ms informacin haga clic aqu or Text READY to 110-315 to register via text  Logan Nelson - following are the goals we discussed in your visit today:   Goals Addressed   None       Social Worker will follow up in 30 days.   Logan Nelson, BSW, [facility]  [place]  High Risk Managed Medicaid Team  [phone]   Following is a copy of your plan of care:  There are no care plans that you recently modified to display for this patient.

## 2021-12-21 NOTE — Patient Outreach (Signed)
BSW received a telephone message from patient asking for resources for any free thanksgiving meals and dental resources. Patient asked for BSW to mail the resources to the address on file. BSW mailed resources for Thanksgiving meals and dental resources.   Logan Nelson, BSW, [facility]  [place]  High Risk Managed Medicaid Team  [phone]

## 2021-12-23 ENCOUNTER — Encounter: Payer: Self-pay | Admitting: Family Medicine

## 2021-12-23 NOTE — Progress Notes (Signed)
Established Patient Office Visit  Subjective    Patient ID: Logan Nelson, male    DOB: [birth year]  Age: 56 y.o. MRN: [id number]  CC:  Chief Complaint  Patient presents with   Follow-up    HPI Logan Nelson presents for follow up of chronic med issues. Patient denies acute complaints.    Outpatient Encounter Medications as of 12/21/2021  Medication Sig   acetaminophen (TYLENOL) 325 MG tablet Take 2 tablets (650 mg total) by mouth every 6 (six) hours as needed for mild pain ((score 1 to 3) or temp > 100.5).   [DISCONTINUED] losartan (COZAAR) 25 MG tablet Take 1 tablet (25 mg total) by mouth daily.   [DISCONTINUED] metoprolol succinate (TOPROL-XL) 100 MG 24 hr tablet Take 1 tablet (100 mg total) by mouth daily. Take with or immediately following a meal.   [DISCONTINUED] spironolactone (ALDACTONE) 25 MG tablet Take 1 tablet (25 mg total) by mouth daily.   cefadroxil (DURICEF) 500 MG capsule Take 1 capsule (500 mg total) by mouth 2 (two) times daily. (Patient not taking: Reported on 12/21/2021)   diclofenac Sodium (VOLTAREN) 1 % GEL Apply 2 g topically 2 (two) times daily. (Patient not taking: Reported on 09/21/2021)   furosemide (LASIX) 40 MG tablet Take 1 tablet (40 mg total) by mouth daily. (Patient not taking: Reported on 09/21/2021)   losartan (COZAAR) 25 MG tablet Take 1 tablet (25 mg total) by mouth daily.   metoprolol succinate (TOPROL-XL) 100 MG 24 hr tablet Take 1 tablet (100 mg total) by mouth daily. Take with or immediately following a meal.   oxyCODONE (OXY IR/ROXICODONE) 5 MG immediate release tablet Take 1 tablet (5 mg total) by mouth every 6 (six) hours as needed for moderate pain. (Patient not taking: Reported on 09/21/2021)   potassium chloride SA (KLOR-CON M) 20 MEQ tablet Take 1 tablet (20 mEq total) by mouth daily. (Patient not taking: Reported on 09/21/2021)   senna-docusate (SENOKOT-S) 8.6-50 MG tablet Take 1 tablet by mouth 2 (two) times daily. (Patient not  taking: Reported on 09/21/2021)   sertraline (ZOLOFT) 25 MG tablet Take 1 tablet (25 mg total) by mouth every morning.   spironolactone (ALDACTONE) 25 MG tablet Take 1 tablet (25 mg total) by mouth daily.   [DISCONTINUED] sertraline (ZOLOFT) 25 MG tablet Take 25 mg by mouth every morning. (Patient not taking: Reported on 09/21/2021)   No facility-administered encounter medications on file as of 12/21/2021.    Past Medical History:  Diagnosis Date   Anemia    Arthritis    Bradycardia    Dyspnea    Falling episodes    GERD (gastroesophageal reflux disease)    Hernia of abdominal wall    Hypertension    Lower extremity edema     Past Surgical History:  Procedure Laterality Date   ABDOMINAL SURGERY     ANTERIOR CERVICAL DECOMP/DISCECTOMY FUSION N/A 06/23/2021   Procedure: Anterior cervical Decompression and epidural Abcess evacuation with arthodesis cervical Four -five;  Surgeon: Coletta Memos, MD;  [facility];  Service: Neurosurgery;  Laterality: N/A;   INGUINAL HERNIA REPAIR Right 04/30/2021   Procedure: HERNIA REPAIR INGUINAL WITH MESH;  Surgeon: Stechschulte, Hyman Hopes, MD;  Location: [facility];  Service: General;  Laterality: Right;   LYSIS OF ADHESION  04/30/2021   Procedure: LYSIS OF ADHESION;  Surgeon: Quentin Ore, MD;  Location: [facility];  Service: General;;   RADIOLOGY WITH ANESTHESIA N/A 06/16/2021   Procedure: MRI CERVICAL THORACIC LUMBER AND  SACRUM;  Surgeon: Radiologist, Medication, MD;  Location: [facility];  Service: Radiology;  Laterality: N/A;   RADIOLOGY WITH ANESTHESIA N/A 06/18/2021   Procedure: MRI WITH ANESTHESIA;  Surgeon: Radiologist, Medication, MD;  [facility];  Service: Radiology;  Laterality: N/A;   RADIOLOGY WITH ANESTHESIA N/A 06/20/2021   Procedure: MRI WITH ANESTHESIA;  Surgeon: Radiologist, Medication, MD;  [facility];  Service: Radiology;  Laterality: N/A;   UMBILICAL HERNIA REPAIR N/A 04/30/2021   Procedure: OPEN UMBILICAL AND EPIGASTRIC HERNIA  REPAIR;  Surgeon: Quentin Ore, MD;  [facility];  Service: General;  Laterality: N/A;    History reviewed. No pertinent family history.  Social History   Socioeconomic History   Marital status: Single    Spouse name: Not on file   Number of children: 1   Years of education: Not on file   Highest education level: High school graduate  Occupational History    Comment: pending disability  Tobacco Use   Smoking status: Former    Types: Cigarettes    Quit date: 04/28/2020    Years since quitting: 1.6   Smokeless tobacco: Never  Vaping Use   Vaping Use: Never used  Substance and Sexual Activity   Alcohol use: Not Currently    Comment: before admission   Drug use: Not Currently    Types: Cocaine    Comment: past history   Sexual activity: Not Currently  Other Topics Concern   Not on file  Social History Narrative   ** Merged History Encounter **       Social Determinants of Health   Financial Resource Strain: Medium Risk (10/01/2021)   Overall Financial Resource Strain (CARDIA)    Difficulty of Paying Living Expenses: Somewhat hard  Food Insecurity: No Food Insecurity (10/01/2021)   Hunger Vital Sign    Worried About Running Out of Food in the Last Year: Never true    Ran Out of Food in the Last Year: Never true  Transportation Needs: No Transportation Needs (07/02/2021)   PRAPARE - Administrator, Civil Service (Medical): No    Lack of Transportation (Non-Medical): No  Physical Activity: Not on file  Stress: Not on file  Social Connections: Not on file  Intimate Partner Violence: Not on file    Review of Systems  Psychiatric/Behavioral:  Positive for depression. Negative for suicidal ideas.   All other systems reviewed and are negative.       Objective    BP 133/89   Pulse 73   Temp 98.1 F (36.7 C) (Oral)   Resp 16   Wt 219 lb (99.3 kg)   SpO2 96%   BMI 34.82 kg/m   Physical Exam Vitals and nursing note reviewed.   Constitutional:      General: He is not in acute distress. Cardiovascular:     Rate and Rhythm: Regular rhythm.  Pulmonary:     Effort: Pulmonary effort is normal.     Breath sounds: Normal breath sounds.  Abdominal:     Palpations: Abdomen is soft.     Tenderness: There is no abdominal tenderness.  Musculoskeletal:     Right lower leg: No edema.     Left lower leg: No edema.  Neurological:     General: No focal deficit present.     Mental Status: He is alert and oriented to person, place, and time.         Assessment & Plan:   1. Primary hypertension Appears stable.  Meds refilled.  - losartan (COZAAR) 25 MG tablet; Take 1 tablet (25 mg total) by mouth daily.  Dispense: 90 tablet; Refill: 1 - spironolactone (ALDACTONE) 25 MG tablet; Take 1 tablet (25 mg total) by mouth daily.  Dispense: 90 tablet; Refill: 1  2. Transient atrial fibrillation (HCC) Management as per consultant. Meds refilled.  - metoprolol succinate (TOPROL-XL) 100 MG 24 hr tablet; Take 1 tablet (100 mg total) by mouth daily. Take with or immediately following a meal.  Dispense: 90 tablet; Refill: 1  3. Anxiety and depression Zoloft prescribed.    Return in about 6 months (around 06/21/2022) for follow up.   Tommie Raymond, MD

## 2021-12-27 ENCOUNTER — Telehealth: Payer: Self-pay

## 2021-12-27 NOTE — Patient Outreach (Signed)
BSW completed a telephone outreach with patient to provide with information for free Thanksgiving meal on 11/21 from 5-7pm at [address] [facility].. Patient wrote the address down.   [name], BSW, [facility]  [place]  High Risk Managed Medicaid Team  [phone]

## 2021-12-29 DIAGNOSIS — H179 Unspecified corneal scar and opacity: Secondary | ICD-10-CM | POA: Diagnosis not present

## 2021-12-30 DIAGNOSIS — H5213 Myopia, bilateral: Secondary | ICD-10-CM | POA: Diagnosis not present

## 2022-01-20 ENCOUNTER — Other Ambulatory Visit: Payer: Medicaid Other

## 2022-01-20 NOTE — Patient Outreach (Signed)
Medicaid Managed Care Social Work Note  01/20/2022 Name:  Logan Nelson MRN:  [id number] DOB:  07/11/1965  Logan Nelson is an 56 y.o. year old male who is a primary patient of [name], MD.  The Medicaid Managed Care Coordination team was consulted for assistance with:  Community Resources   [name] was given information about Medicaid Managed Care Coordination team services today. Logan Nelson Patient agreed to services and verbal consent obtained.  Engaged with patient  for by telephone forfollow up visit in response to referral for case management and/or care coordination services.   Assessments/Interventions:  Review of past medical history, allergies, medications, health status, including review of consultants reports, laboratory and other test data, was performed as part of comprehensive evaluation and provision of chronic care management services.  SDOH: (Social Determinant of Health) assessments and interventions performed: SDOH Interventions    Flowsheet Row ED to Hosp-Admission (Discharged) from 06/16/2021 in [facility] HF PCU  SDOH Interventions   Food Insecurity Interventions Intervention Not Indicated  Housing Interventions Intervention Not Indicated  Transportation Interventions Intervention Not Indicated     BSW completed a telephone outreach with patient. He stated he is taking things day by day but is in pain. Patient states he doctor canceled his appointment and he does not know why, patient did not contact the office to see why the appointment was cancelled. Patient stated he was in a lot of pain. BSW encouraged patient to contact the office to reschedule the appointment.  Advanced Directives Status:  Not addressed in this encounter.  Care Plan                 No Known Allergies  Medications Reviewed Today     Reviewed by [name], MD (Physician) on 12/23/21 at 1216  Med List Status: <None>   Medication Order  Taking? Sig Documenting Provider Last Dose Status Informant  acetaminophen (TYLENOL) 325 MG tablet [id number] Yes Take 2 tablets (650 mg total) by mouth every 6 (six) hours as needed for mild pain ((score 1 to 3) or temp > 100.5). Zannie Cove, MD Taking Active   cefadroxil (DURICEF) 500 MG capsule [id number] No Take 1 capsule (500 mg total) by mouth 2 (two) times daily.  Patient not taking: Reported on 12/21/2021   Veryl Speak, FNP Not Taking Consider Medication Status and Discontinue   diclofenac Sodium (VOLTAREN) 1 % GEL [id number] No Apply 2 g topically 2 (two) times daily.  Patient not taking: Reported on 09/21/2021   [provider] Not Taking Active   furosemide (LASIX) 40 MG tablet [id number] No Take 1 tablet (40 mg total) by mouth daily.  Patient not taking: Reported on 09/21/2021   Rema Fendt, NP Not Taking Active   losartan (COZAAR) 25 MG tablet [id number]  Take 1 tablet (25 mg total) by mouth daily. [name], MD  Active   metoprolol succinate (TOPROL-XL) 100 MG 24 hr tablet [id number]  Take 1 tablet (100 mg total) by mouth daily. Take with or immediately following a meal. [name], MD  Active   oxyCODONE (OXY IR/ROXICODONE) 5 MG immediate release tablet [id number] No Take 1 tablet (5 mg total) by mouth every 6 (six) hours as needed for moderate pain.  Patient not taking: Reported on 09/21/2021   Zannie Cove, MD Not Taking Active   potassium chloride SA (KLOR-CON M) 20 MEQ tablet [id number] No Take 1 tablet (20 mEq total) by mouth daily.  Patient not  taking: Reported on 09/21/2021   Domenic Polite, MD Not Taking Active   senna-docusate (SENOKOT-S) 8.6-50 MG tablet [id number] No Take 1 tablet by mouth 2 (two) times daily.  Patient not taking: Reported on 09/21/2021   Domenic Polite, MD Not Taking Active   sertraline (ZOLOFT) 25 MG tablet [id number]  Take 1 tablet (25 mg total) by mouth every morning. Logan Mai, MD  Active   spironolactone (ALDACTONE) 25  MG tablet [id number]  Take 1 tablet (25 mg total) by mouth daily. Logan Mai, MD  Active             Patient Active Problem List   Diagnosis Date Noted   Abscess in epidural space of cervical spine 06/23/2021   Epidural abscess    Acute systolic (congestive) heart failure ([place]) 06/17/2021   Prolonged QT interval 06/17/2021   MSSA bacteremia    Pressure ulcer of sacral region, stage 3 ([place])    Acute heart failure with preserved ejection fraction (HFpEF) ([place]) 06/13/2021   Anasarca    Pressure injury of sacral region, unstageable ([place]) 05/25/2021   Pressure injury of buttock, stage 2 ([place]) 05/14/2021   Hypomagnesemia 05/13/2021   Vitamin D deficiency 05/13/2021   Generalized weakness 05/12/2021   Hypophosphatemia 05/12/2021   Lower extremity weakness 05/11/2021   Hypokalemia 05/11/2021   Anemia of chronic disease 05/11/2021   GERD (gastroesophageal reflux disease) 05/11/2021   Bilateral inguinal hernia 04/23/2021   Protein-calorie malnutrition, severe 04/14/2021   Protein calorie malnutrition ([place]) 04/13/2021   Protein-calorie malnutrition ([place]) 04/12/2021   Hepatic steatosis 04/12/2021   Stroke ([place]) 04/11/2021   Small bowel obstruction ([place]) 10/13/2020   Anemia due to vitamin B12 deficiency 10/13/2020   Hypertension 01/09/2015   Incisional hernia 01/07/2015    Conditions to be addressed/monitored per PCP order:   community resources  There are no care plans that you recently modified to display for this patient.   Follow up:  Patient agrees to Care Plan and Follow-up.  Plan: The Managed Medicaid care management team will reach out to the patient again over the next 30 days.  Date/time of next scheduled Social Work care management/care coordination outreach:  02/21/22  Mickel Fuchs, Arita Miss, [place] Medicaid Team  [phone]

## 2022-01-20 NOTE — Patient Instructions (Signed)
Visit Information  Logan Nelson was given information about Medicaid Managed Care team care coordination services as a part of their [facility] Medicaid benefit. Logan Nelson verbally consented to engagement with the [facility] Managed Care team.   If you are experiencing a medical emergency, please call 911 or report to your local emergency department or urgent care.   If you have a non-emergency medical problem during routine business hours, please contact your provider's office and ask to speak with a nurse.   For questions related to your [facility], please call: [phone] or visit the homepage here: [email or web address]  If you would like to schedule transportation through your [facility], please call the following number at least 2 days in advance of your appointment: [phone]   Rides for urgent appointments can also be made after hours by calling Member Services.  Call the Behavioral Health Crisis Line at [phone], at any time, 24 hours a day, 7 days a week. If you are in danger or need immediate medical attention call 911.  If you would like help to quit smoking, call 1-800-QUIT-NOW ((385) 690-1329) OR Espaol: 1-855-Djelo-Ya (5-643-329-5188) o para ms informacin haga clic aqu or Text READY to 416-606 to register via text  Logan Nelson - following are the goals we discussed in your visit today:   Goals Addressed   None       Social Worker will follow up on 02/21/22.   [name], BSW, [facility]  [place]  High Risk Managed Medicaid Team  [phone]   Following is a copy of your plan of care:  There are no care plans that you recently modified to display for this patient.

## 2022-02-09 ENCOUNTER — Ambulatory Visit (INDEPENDENT_AMBULATORY_CARE_PROVIDER_SITE_OTHER): Payer: Medicaid Other | Admitting: Orthopaedic Surgery

## 2022-02-09 DIAGNOSIS — M19011 Primary osteoarthritis, right shoulder: Secondary | ICD-10-CM

## 2022-02-09 MED ORDER — TRIAMCINOLONE ACETONIDE 40 MG/ML IJ SUSP
80.0000 mg | INTRAMUSCULAR | Status: AC | PRN
  Administered 2022-02-09: 80 mg via INTRA_ARTICULAR

## 2022-02-09 MED ORDER — LIDOCAINE HCL 1 % IJ SOLN
4.0000 mL | INTRAMUSCULAR | Status: AC | PRN
  Administered 2022-02-09: 4 mL

## 2022-02-09 NOTE — Progress Notes (Signed)
Chief Complaint: Right shoulder pain     History of Present Illness:   02/09/2022: Right shoulder osteoarthritis.  He presents today for follow-up and injection.  He states that his motion is improved since last injection.  He is here today seeking an additional injection.  Logan Nelson is a 57 y.o. male right-hand-dominant presents with right shoulder pain which has now been ongoing for several years.  He states that he has very limited overhead activity.  He recently had an infection drained in his neck.  He is here today as a referral from Dr. Warren Danes office for right shoulder ultrasound-guided injection    Surgical History:   None  PMH/PSH/Family History/Social History/Meds/Allergies:    Past Medical History:  Diagnosis Date   Anemia    Arthritis    Bradycardia    Dyspnea    Falling episodes    GERD (gastroesophageal reflux disease)    Hernia of abdominal wall    Hypertension    Lower extremity edema    Past Surgical History:  Procedure Laterality Date   ABDOMINAL SURGERY     ANTERIOR CERVICAL DECOMP/DISCECTOMY FUSION N/A 06/23/2021   Procedure: Anterior cervical Decompression and epidural Abcess evacuation with arthodesis cervical Four -five;  Surgeon: Coletta Memos, MD;  [facility];  Service: Neurosurgery;  Laterality: N/A;   INGUINAL HERNIA REPAIR Right 04/30/2021   Procedure: HERNIA REPAIR INGUINAL WITH MESH;  Surgeon: Stechschulte, Hyman Hopes, MD;  Location: [facility] OR;  Service: General;  Laterality: Right;   LYSIS OF ADHESION  04/30/2021   Procedure: LYSIS OF ADHESION;  Surgeon: Quentin Ore, MD;  Location: [facility] OR;  Service: General;;   RADIOLOGY WITH ANESTHESIA N/A 06/16/2021   Procedure: MRI CERVICAL THORACIC LUMBER AND SACRUM;  Surgeon: Radiologist, Medication, MD;  Location: [facility] OR;  Service: Radiology;  Laterality: N/A;   RADIOLOGY WITH ANESTHESIA N/A 06/18/2021   Procedure: MRI WITH ANESTHESIA;  Surgeon: Radiologist,  Medication, MD;  Location: [facility] OR;  Service: Radiology;  Laterality: N/A;   RADIOLOGY WITH ANESTHESIA N/A 06/20/2021   Procedure: MRI WITH ANESTHESIA;  Surgeon: Radiologist, Medication, MD;  Location: [facility] OR;  Service: Radiology;  Laterality: N/A;   UMBILICAL HERNIA REPAIR N/A 04/30/2021   Procedure: OPEN UMBILICAL AND EPIGASTRIC HERNIA REPAIR;  Surgeon: Quentin Ore, MD;  Location: [facility] OR;  Service: General;  Laterality: N/A;   Social History   Socioeconomic History   Marital status: Single    Spouse name: Not on file   Number of children: 1   Years of education: Not on file   Highest education level: High school graduate  Occupational History    Comment: pending disability  Tobacco Use   Smoking status: Former    Types: Cigarettes    Quit date: 04/28/2020    Years since quitting: 1.7   Smokeless tobacco: Never  Vaping Use   Vaping Use: Never used  Substance and Sexual Activity   Alcohol use: Not Currently    Comment: before admission   Drug use: Not Currently    Types: Cocaine    Comment: past history   Sexual activity: Not Currently  Other Topics Concern   Not on file  Social History Narrative   ** Merged History Encounter **       Social Determinants of Corporate investment banker  Strain: Medium Risk (10/01/2021)   Overall Financial Resource Strain (CARDIA)    Difficulty of Paying Living Expenses: Somewhat hard  Food Insecurity: No Food Insecurity (10/01/2021)   Hunger Vital Sign    Worried About Running Out of Food in the Last Year: Never true    Logan Nelson in the Last Year: Never true  Transportation Needs: No Transportation Needs (07/02/2021)   PRAPARE - Hydrologist (Medical): No    Lack of Transportation (Non-Medical): No  Physical Activity: Not on file  Stress: Not on file  Social Connections: Not on file   No family history on file. No Known Allergies Current Outpatient Medications  Medication Sig Dispense Refill    acetaminophen (TYLENOL) 325 MG tablet Take 2 tablets (650 mg total) by mouth every 6 (six) hours as needed for mild pain ((score 1 to 3) or temp > 100.5).     diclofenac Sodium (VOLTAREN) 1 % GEL Apply 2 g topically 2 (two) times daily. (Patient not taking: Reported on 09/21/2021)     furosemide (LASIX) 40 MG tablet Take 1 tablet (40 mg total) by mouth daily. (Patient not taking: Reported on 09/21/2021) 30 tablet 1   losartan (COZAAR) 25 MG tablet Take 1 tablet (25 mg total) by mouth daily. 90 tablet 1   metoprolol succinate (TOPROL-XL) 100 MG 24 hr tablet Take 1 tablet (100 mg total) by mouth daily. Take with or immediately following a meal. 90 tablet 1   oxyCODONE (OXY IR/ROXICODONE) 5 MG immediate release tablet Take 1 tablet (5 mg total) by mouth every 6 (six) hours as needed for moderate pain. (Patient not taking: Reported on 09/21/2021) 20 tablet 0   potassium chloride SA (KLOR-CON M) 20 MEQ tablet Take 1 tablet (20 mEq total) by mouth daily. (Patient not taking: Reported on 09/21/2021)     senna-docusate (SENOKOT-S) 8.6-50 MG tablet Take 1 tablet by mouth 2 (two) times daily. (Patient not taking: Reported on 09/21/2021)     sertraline (ZOLOFT) 25 MG tablet Take 1 tablet (25 mg total) by mouth every morning. 90 tablet 1   spironolactone (ALDACTONE) 25 MG tablet Take 1 tablet (25 mg total) by mouth daily. 90 tablet 1   No current facility-administered medications for this visit.   No results found.  Review of Systems:   A ROS was performed including pertinent positives and negatives as documented in the HPI.  Physical Exam :   Constitutional: NAD and appears stated age Neurological: Alert and oriented Psych: Appropriate affect and cooperative There were no vitals taken for this visit.   Comprehensive Musculoskeletal Exam:    Right shoulder range of motion actively passively is to 20 degrees flexion.  External rotation at the side is to neutral.  Internal rotation of the back pocket.   Significant glenohumeral pain  Imaging:   Xray (right shoulder 3 views): Advanced glenohumeral osteoarthritis    I personally reviewed and interpreted the radiographs.   Assessment:   57 y.o. male with advanced glenohumeral osteoarthritis.  Unfortunately at this time I described that I do not necessarily believe he would be a candidate for shoulder arthroplasty.  That effect I do believe that as needed injections are ideal for him.  He would like 1 of these today.  Plan :    -Right shoulder ultrasound-guided injection performed after verbal consent obtained    Procedure Note  Patient: Logan Nelson  Date of Birth: 10/21/1965           MRN: [id number]             Visit Date: 02/09/2022  Procedures: Visit Diagnoses:  1. Primary osteoarthritis, right shoulder     Large Joint Inj: R glenohumeral on 02/09/2022 11:51 AM Indications: pain Details: 22 G 1.5 in needle, ultrasound-guided anterior approach  Arthrogram: No  Medications: 4 mL lidocaine 1 %; 80 mg triamcinolone acetonide 40 MG/ML Outcome: tolerated well, no immediate complications Procedure, treatment alternatives, risks and benefits explained, specific risks discussed. Consent was given by the patient. Immediately prior to procedure a time out was called to verify the correct patient, procedure, equipment, support staff and site/side marked as required. Patient was prepped and draped in the usual sterile fashion.           I personally saw and evaluated the patient, and participated in the management and treatment plan.  Vanetta Mulders, MD Attending Physician, Orthopedic Surgery  This document was dictated using Dragon voice recognition software. A reasonable attempt at proof reading has been made to minimize errors.

## 2022-02-21 ENCOUNTER — Other Ambulatory Visit: Payer: Medicaid Other

## 2022-02-21 NOTE — Patient Instructions (Signed)
Visit Information  Mr. Scarpelli was given information about Medicaid Managed Care team care coordination services as a part of their [place] Medicaid benefit. Ajai Markanthony Gedney verbally consented to engagement with the [facility] Managed Care team.   If you are experiencing a medical emergency, please call 911 or report to your local emergency department or urgent care.   If you have a non-emergency medical problem during routine business hours, please contact your provider's office and ask to speak with a nurse.   For questions related to your [facility], please call: [phone] or visit the homepage here: [email or web address]  If you would like to schedule transportation through your [facility], please call the following number at least 2 days in advance of your appointment: [phone]   Rides for urgent appointments can also be made after hours by calling Member Services.  Call the [place] at [phone], at any time, 24 hours a day, 7 days a week. If you are in danger or need immediate medical attention call 911.  If you would like help to quit smoking, call 1-800-QUIT-NOW (407)260-3784) OR Espaol: 1-855-Djelo-Ya (3-664-403-4742) o para ms informacin haga clic aqu or Text READY to 200-400 to register via text  Mr. Caporale - following are the goals we discussed in your visit today:   Goals Addressed   None     Social Worker will follow up on 03/11/22.   Mickel Fuchs, BSW, [place] Managed Medicaid Team  [phone]   Following is a copy of your plan of care:  There are no care plans that you recently modified to display for this patient.

## 2022-02-21 NOTE — Patient Outreach (Signed)
Patient asked for BSW to call him back in an hour because he was at the grocery store.   Logan Nelson, BSW, [place] Managed Medicaid Team  [phone]

## 2022-02-21 NOTE — Patient Outreach (Signed)
Medicaid Managed Care Social Work Note  02/21/2022 Name:  Logan Nelson MRN:  [id number] DOB:  1966/01/18  Logan Nelson is an 57 y.o. year old male who is a primary patient of [name], MD.  The Medicaid Managed Care Coordination team was consulted for assistance with:  Community Resources   Logan Nelson was given information about Medicaid Managed Care Coordination team services today. Veronica Beverley Fiedler Patient agreed to services and verbal consent obtained.  Engaged with patient  for by telephone forfollow up visit in response to referral for case management and/or care coordination services.   Assessments/Interventions:  Review of past medical history, allergies, medications, health status, including review of consultants reports, laboratory and other test data, was performed as part of comprehensive evaluation and provision of chronic care management services.  SDOH: (Social Determinant of Health) assessments and interventions performed: SDOH Interventions    Flowsheet Row ED to Hosp-Admission (Discharged) from 06/16/2021 in [place] HF PCU  SDOH Interventions   Food Insecurity Interventions Intervention Not Indicated  Housing Interventions Intervention Not Indicated  Transportation Interventions Intervention Not Indicated     BSW completed a telephone outreach with patient. He stated he is doing well but would like some extra food resources and would like some resources for assistance with a deposit for housing. Patient states he is able to afford the rent, but cannot do it all at once. BSW will mail patient resources for food and deposit assistance.  Advanced Directives Status:  Not addressed in this encounter.  Care Plan                 No Known Allergies  Medications Reviewed Today     Reviewed by [name], MD (Physician) on 12/23/21 at 1216  Med List Status: <None>   Medication Order Taking? Sig Documenting Provider Last Dose Status  Informant  acetaminophen (TYLENOL) 325 MG tablet [id number] Yes Take 2 tablets (650 mg total) by mouth every 6 (six) hours as needed for mild pain ((score 1 to 3) or temp > 100.5). Domenic Polite, MD Taking Active   cefadroxil (DURICEF) 500 MG capsule [id number] No Take 1 capsule (500 mg total) by mouth 2 (two) times daily.  Patient not taking: Reported on 12/21/2021   [place], FNP Not Taking Consider Medication Status and Discontinue   diclofenac Sodium (VOLTAREN) 1 % GEL [id number] No Apply 2 g topically 2 (two) times daily.  Patient not taking: Reported on 09/21/2021   [provider] Not Taking Active   furosemide (LASIX) 40 MG tablet [id number] No Take 1 tablet (40 mg total) by mouth daily.  Patient not taking: Reported on 09/21/2021   Camillia Herter, NP Not Taking Active   losartan (COZAAR) 25 MG tablet [id number]  Take 1 tablet (25 mg total) by mouth daily. [name], MD  Active   metoprolol succinate (TOPROL-XL) 100 MG 24 hr tablet [id number]  Take 1 tablet (100 mg total) by mouth daily. Take with or immediately following a meal. [name], MD  Active   oxyCODONE (OXY IR/ROXICODONE) 5 MG immediate release tablet [id number] No Take 1 tablet (5 mg total) by mouth every 6 (six) hours as needed for moderate pain.  Patient not taking: Reported on 09/21/2021   Domenic Polite, MD Not Taking Active   potassium chloride SA (KLOR-CON M) 20 MEQ tablet [id number] No Take 1 tablet (20 mEq total) by mouth daily.  Patient not taking: Reported on 09/21/2021   Domenic Polite,  MD Not Taking Active   senna-docusate (SENOKOT-S) 8.6-50 MG tablet [id number] No Take 1 tablet by mouth 2 (two) times daily.  Patient not taking: Reported on 09/21/2021   Domenic Polite, MD Not Taking Active   sertraline (ZOLOFT) 25 MG tablet [id number]  Take 1 tablet (25 mg total) by mouth every morning. [name], MD  Active   spironolactone (ALDACTONE) 25 MG tablet [id number]  Take 1 tablet (25 mg total)  by mouth daily. [name], MD  Active             Patient Active Problem List   Diagnosis Date Noted   Abscess in epidural space of cervical spine 06/23/2021   Epidural abscess    Acute systolic (congestive) heart failure ([place]) 06/17/2021   Prolonged QT interval 06/17/2021   MSSA bacteremia    Pressure ulcer of sacral region, stage 3 ([place])    Acute heart failure with preserved ejection fraction (HFpEF) ([place]) 06/13/2021   Anasarca    Pressure injury of sacral region, unstageable ([place]) 05/25/2021   Pressure injury of buttock, stage 2 ([place]) 05/14/2021   Hypomagnesemia 05/13/2021   Vitamin D deficiency 05/13/2021   Generalized weakness 05/12/2021   Hypophosphatemia 05/12/2021   Lower extremity weakness 05/11/2021   Hypokalemia 05/11/2021   Anemia of chronic disease 05/11/2021   GERD (gastroesophageal reflux disease) 05/11/2021   Bilateral inguinal hernia 04/23/2021   Protein-calorie malnutrition, severe 04/14/2021   Protein calorie malnutrition ([place]) 04/13/2021   Protein-calorie malnutrition ([place]) 04/12/2021   Hepatic steatosis 04/12/2021   Stroke ([place]) 04/11/2021   Small bowel obstruction ([place]) 10/13/2020   Anemia due to vitamin B12 deficiency 10/13/2020   Hypertension 01/09/2015   Incisional hernia 01/07/2015    Conditions to be addressed/monitored per PCP order:   community resources  There are no care plans that you recently modified to display for this patient.   Follow up:  Patient agrees to Care Plan and Follow-up.  Plan: The Managed Medicaid care management team will reach out to the patient again over the next 14 days.  Date/time of next scheduled Social Work care management/care coordination outreach:  03/11/22  Mickel Fuchs, Arita Miss, [place] Medicaid Team  [phone]

## 2022-03-11 ENCOUNTER — Other Ambulatory Visit: Payer: Medicaid Other

## 2022-03-11 NOTE — Patient Outreach (Signed)
Medicaid Managed Care Social Work Note  03/11/2022 Name:  Logan Nelson MRN:  [id number] DOB:  1965/03/27  Logan Nelson is an 57 y.o. year old male who is a primary patient of [name], MD.  The Medicaid Managed Care Coordination team was consulted for assistance with:  Community Resources   [name] was given information about Medicaid Managed Care Coordination team services today. Logan Nelson Patient agreed to services and verbal consent obtained.  Engaged with patient  for by telephone forfollow up visit in response to referral for case management and/or care coordination services.   Assessments/Interventions:  Review of past medical history, allergies, medications, health status, including review of consultants reports, laboratory and other test data, was performed as part of comprehensive evaluation and provision of chronic care management services.  SDOH: (Social Determinant of Health) assessments and interventions performed: SDOH Interventions    Flowsheet Row ED to Hosp-Admission (Discharged) from 06/16/2021 in [place] [facility] PCU  SDOH Interventions   Food Insecurity Interventions Intervention Not Indicated  Housing Interventions Intervention Not Indicated  Transportation Interventions Intervention Not Indicated     BSW completed a telephone outreach with patient to follow up on letter. Patient stated he did not receive the letter BSW sent to him. BSW will resend resources to patient.  Advanced Directives Status:  Not addressed in this encounter.  Care Plan                 No Known Allergies  Medications Reviewed Today     Reviewed by [name], MD (Physician) on 12/23/21 at 1216  Med List Status: <None>   Medication Order Taking? Sig Documenting Provider Last Dose Status Informant  acetaminophen (TYLENOL) 325 MG tablet [id number] Yes Take 2 tablets (650 mg total) by mouth every 6 (six) hours as needed for mild pain ((score 1 to  3) or temp > 100.5). Domenic Polite, MD Taking Active   cefadroxil (DURICEF) 500 MG capsule [id number] No Take 1 capsule (500 mg total) by mouth 2 (two) times daily.  Patient not taking: Reported on 12/21/2021   Golden Circle, FNP Not Taking Consider Medication Status and Discontinue   diclofenac Sodium (VOLTAREN) 1 % GEL [id number] No Apply 2 g topically 2 (two) times daily.  Patient not taking: Reported on 09/21/2021   [provider] Not Taking Active   furosemide (LASIX) 40 MG tablet [id number] No Take 1 tablet (40 mg total) by mouth daily.  Patient not taking: Reported on 09/21/2021   Camillia Herter, NP Not Taking Active   losartan (COZAAR) 25 MG tablet [id number]  Take 1 tablet (25 mg total) by mouth daily. [name], MD  Active   metoprolol succinate (TOPROL-XL) 100 MG 24 hr tablet [id number]  Take 1 tablet (100 mg total) by mouth daily. Take with or immediately following a meal. [name], MD  Active   oxyCODONE (OXY IR/ROXICODONE) 5 MG immediate release tablet [id number] No Take 1 tablet (5 mg total) by mouth every 6 (six) hours as needed for moderate pain.  Patient not taking: Reported on 09/21/2021   Domenic Polite, MD Not Taking Active   potassium chloride SA (KLOR-CON M) 20 MEQ tablet [id number] No Take 1 tablet (20 mEq total) by mouth daily.  Patient not taking: Reported on 09/21/2021   Domenic Polite, MD Not Taking Active   senna-docusate (SENOKOT-S) 8.6-50 MG tablet [id number] No Take 1 tablet by mouth 2 (two) times daily.  Patient not taking: Reported on  09/21/2021   Domenic Polite, MD Not Taking Active   sertraline (ZOLOFT) 25 MG tablet [id number]  Take 1 tablet (25 mg total) by mouth every morning. [name], MD  Active   spironolactone (ALDACTONE) 25 MG tablet [id number]  Take 1 tablet (25 mg total) by mouth daily. [name], MD  Active             Patient Active Problem List   Diagnosis Date Noted   Abscess in epidural space of cervical  spine 06/23/2021   Epidural abscess    Acute systolic (congestive) heart failure ([place]) 06/17/2021   Prolonged QT interval 06/17/2021   MSSA bacteremia    Pressure ulcer of sacral region, stage 3 ([place])    Acute heart failure with preserved ejection fraction (HFpEF) ([place]) 06/13/2021   Anasarca    Pressure injury of sacral region, unstageable ([place]) 05/25/2021   Pressure injury of buttock, stage 2 ([place]) 05/14/2021   Hypomagnesemia 05/13/2021   Vitamin D deficiency 05/13/2021   Generalized weakness 05/12/2021   Hypophosphatemia 05/12/2021   Lower extremity weakness 05/11/2021   Hypokalemia 05/11/2021   Anemia of chronic disease 05/11/2021   GERD (gastroesophageal reflux disease) 05/11/2021   Bilateral inguinal hernia 04/23/2021   Protein-calorie malnutrition, severe 04/14/2021   Protein calorie malnutrition ([place]) 04/13/2021   Protein-calorie malnutrition ([place]) 04/12/2021   Hepatic steatosis 04/12/2021   Stroke ([place]) 04/11/2021   Small bowel obstruction ([place]) 10/13/2020   Anemia due to vitamin B12 deficiency 10/13/2020   Hypertension 01/09/2015   Incisional hernia 01/07/2015    Conditions to be addressed/monitored per PCP order:   community resources  There are no care plans that you recently modified to display for this patient.   Follow up:  Patient agrees to Care Plan and Follow-up.  Plan: The Managed Medicaid care management team will reach out to the patient again over the next 14 days.  Date/time of next scheduled Social Work care management/care coordination outreach:  03/31/22  Mickel Fuchs, Arita Miss, [place] Medicaid Team  [phone]

## 2022-03-11 NOTE — Patient Instructions (Signed)
Visit Information  Mr. Bitner was given information about Medicaid Managed Care team care coordination services as a part of their [place] Medicaid benefit. Senay Antonius Hartlage verbally consented to engagement with the [facility] Managed Care team.   If you are experiencing a medical emergency, please call 911 or report to your local emergency department or urgent care.   If you have a non-emergency medical problem during routine business hours, please contact your provider's office and ask to speak with a nurse.   For questions related to your [facility], please call: [phone] or visit the homepage here: [email or web address]  If you would like to schedule transportation through your [facility], please call the following number at least 2 days in advance of your appointment: [phone]   Rides for urgent appointments can also be made after hours by calling Member Services.  Call the [place] at [phone], at any time, 24 hours a day, 7 days a week. If you are in danger or need immediate medical attention call 911.  If you would like help to quit smoking, call 1-800-QUIT-NOW 3234388067) OR Espaol: 1-855-Djelo-Ya (0-254-270-6237) o para ms informacin haga clic aqu or Text READY to 200-400 to register via text  Mr. Osei - following are the goals we discussed in your visit today:   Goals Addressed   None       Social Worker will follow up in 14 days.   Mickel Fuchs, BSW, [place] Managed Medicaid Team  [phone]   Following is a copy of your plan of care:  There are no care plans that you recently modified to display for this patient.

## 2022-03-21 ENCOUNTER — Ambulatory Visit: Payer: Medicaid Other | Admitting: Family Medicine

## 2022-03-31 ENCOUNTER — Other Ambulatory Visit: Payer: Medicaid Other

## 2022-03-31 NOTE — Patient Instructions (Signed)
  Medicaid Managed Care   Unsuccessful Outreach Note  03/31/2022 Name: Logan Nelson MRN: [id number] DOB: [birth year]  Referred by: [name], MD Reason for referral : High Risk Managed Medicaid (MM Social work telephone outreach )   An unsuccessful telephone outreach was attempted today. The patient was referred to the case management team for assistance with care management and care coordination.   Follow Up Plan: The patient has been provided with contact information for the care management team and has been advised to call with any health related questions or concerns.   Mickel Fuchs, BSW, [place] Managed Medicaid Team  [phone]

## 2022-03-31 NOTE — Patient Outreach (Signed)
  Medicaid Managed Care   Unsuccessful Outreach Note  03/31/2022 Name: Logan Nelson MRN: [id number] DOB: 1966-01-14  Referred by: [name], MD Reason for referral : High Risk Managed Medicaid (MM Social work telephone outreach )   An unsuccessful telephone outreach was attempted today. The patient was referred to the case management team for assistance with care management and care coordination.   Follow Up Plan: The patient has been provided with contact information for the care management team and has been advised to call with any health related questions or concerns.   Mickel Fuchs, BSW, [place] Managed Medicaid Team  [phone]

## 2022-04-05 ENCOUNTER — Encounter: Payer: Self-pay | Admitting: Family Medicine

## 2022-04-05 ENCOUNTER — Ambulatory Visit (INDEPENDENT_AMBULATORY_CARE_PROVIDER_SITE_OTHER): Payer: Medicaid Other | Admitting: Family Medicine

## 2022-04-05 VITALS — BP 152/109 | HR 82 | Temp 98.1°F | Resp 16 | Ht 66.0 in | Wt 242.8 lb

## 2022-04-05 DIAGNOSIS — I4891 Unspecified atrial fibrillation: Secondary | ICD-10-CM | POA: Diagnosis not present

## 2022-04-05 DIAGNOSIS — F419 Anxiety disorder, unspecified: Secondary | ICD-10-CM

## 2022-04-05 DIAGNOSIS — F32A Depression, unspecified: Secondary | ICD-10-CM

## 2022-04-05 DIAGNOSIS — Z6839 Body mass index (BMI) 39.0-39.9, adult: Secondary | ICD-10-CM | POA: Diagnosis not present

## 2022-04-05 DIAGNOSIS — I10 Essential (primary) hypertension: Secondary | ICD-10-CM

## 2022-04-05 MED ORDER — POTASSIUM CHLORIDE CRYS ER 20 MEQ PO TBCR: 20.0000 meq | EXTENDED_RELEASE_TABLET | Freq: Every day | ORAL | 1 refills | Status: DC

## 2022-04-05 MED ORDER — LOSARTAN POTASSIUM 25 MG PO TABS: 25.0000 mg | ORAL_TABLET | Freq: Every day | ORAL | 1 refills | Status: DC

## 2022-04-05 MED ORDER — SERTRALINE HCL 25 MG PO TABS: 25.0000 mg | ORAL_TABLET | Freq: Every morning | ORAL | 1 refills | Status: DC

## 2022-04-05 MED ORDER — METOPROLOL SUCCINATE ER 100 MG PO TB24: 100.0000 mg | ORAL_TABLET | Freq: Every day | ORAL | 1 refills | Status: DC

## 2022-04-05 MED ORDER — FUROSEMIDE 40 MG PO TABS: 40.0000 mg | ORAL_TABLET | Freq: Every day | ORAL | 1 refills | Status: DC

## 2022-04-05 MED ORDER — SPIRONOLACTONE 25 MG PO TABS: 25.0000 mg | ORAL_TABLET | Freq: Every day | ORAL | 1 refills | Status: DC

## 2022-04-05 NOTE — Progress Notes (Unsigned)
Patient is here for their 3 month follow-up Patient has no concerns today Care gaps have been discussed with patient

## 2022-04-07 ENCOUNTER — Encounter: Payer: Self-pay | Admitting: Family Medicine

## 2022-04-07 NOTE — Progress Notes (Signed)
Established Patient Office Visit  Subjective    Patient ID: Logan Nelson, male    DOB: 27-Jun-1965  Age: 57 y.o. MRN: [id number]  CC:  Chief Complaint  Patient presents with   Follow-up    HPI Logan Nelson presents for routine follow up of chronic med issues. Patient reports that he had not been compliant with meds 2/2 $$.    Outpatient Encounter Medications as of 04/05/2022  Medication Sig   losartan (COZAAR) 25 MG tablet Take 1 tablet (25 mg total) by mouth daily.   metoprolol succinate (TOPROL-XL) 100 MG 24 hr tablet Take 1 tablet (100 mg total) by mouth daily. Take with or immediately following a meal.   senna-docusate (SENOKOT-S) 8.6-50 MG tablet Take 1 tablet by mouth 2 (two) times daily. (Patient not taking: Reported on 09/21/2021)   sertraline (ZOLOFT) 25 MG tablet Take 1 tablet (25 mg total) by mouth every morning.   spironolactone (ALDACTONE) 25 MG tablet Take 1 tablet (25 mg total) by mouth daily.   [DISCONTINUED] acetaminophen (TYLENOL) 325 MG tablet Take 2 tablets (650 mg total) by mouth every 6 (six) hours as needed for mild pain ((score 1 to 3) or temp > 100.5).   [DISCONTINUED] diclofenac Sodium (VOLTAREN) 1 % GEL Apply 2 g topically 2 (two) times daily. (Patient not taking: Reported on 09/21/2021)   [DISCONTINUED] furosemide (LASIX) 40 MG tablet Take 1 tablet (40 mg total) by mouth daily. (Patient not taking: Reported on 09/21/2021)   [DISCONTINUED] furosemide (LASIX) 40 MG tablet Take 1 tablet (40 mg total) by mouth daily.   [DISCONTINUED] losartan (COZAAR) 25 MG tablet Take 1 tablet (25 mg total) by mouth daily. (Patient not taking: Reported on 04/05/2022)   [DISCONTINUED] metoprolol succinate (TOPROL-XL) 100 MG 24 hr tablet Take 1 tablet (100 mg total) by mouth daily. Take with or immediately following a meal. (Patient not taking: Reported on 04/05/2022)   [DISCONTINUED] oxyCODONE (OXY IR/ROXICODONE) 5 MG immediate release tablet Take 1 tablet (5 mg total)  by mouth every 6 (six) hours as needed for moderate pain. (Patient not taking: Reported on 09/21/2021)   [DISCONTINUED] potassium chloride SA (KLOR-CON M) 20 MEQ tablet Take 1 tablet (20 mEq total) by mouth daily. (Patient not taking: Reported on 09/21/2021)   [DISCONTINUED] potassium chloride SA (KLOR-CON M) 20 MEQ tablet Take 1 tablet (20 mEq total) by mouth daily.   [DISCONTINUED] sertraline (ZOLOFT) 25 MG tablet Take 1 tablet (25 mg total) by mouth every morning. (Patient not taking: Reported on 04/05/2022)   [DISCONTINUED] spironolactone (ALDACTONE) 25 MG tablet Take 1 tablet (25 mg total) by mouth daily. (Patient not taking: Reported on 04/05/2022)   No facility-administered encounter medications on file as of 04/05/2022.    Past Medical History:  Diagnosis Date   Anemia    Arthritis    Bradycardia    Dyspnea    Falling episodes    GERD (gastroesophageal reflux disease)    Hernia of abdominal wall    Hypertension    Lower extremity edema     Past Surgical History:  Procedure Laterality Date   ABDOMINAL SURGERY     ANTERIOR CERVICAL DECOMP/DISCECTOMY FUSION N/A 06/23/2021   Procedure: Anterior cervical Decompression and epidural Abcess evacuation with arthodesis cervical Four -five;  Surgeon: Ashok Pall, MD;  Location: [place];  Service: Neurosurgery;  Laterality: N/A;   INGUINAL HERNIA REPAIR Right 04/30/2021   Procedure: HERNIA REPAIR INGUINAL WITH MESH;  Surgeon: Stechschulte, Nickola Major, MD;  Location: [place];  Service: General;  Laterality: Right;   LYSIS OF ADHESION  04/30/2021   Procedure: LYSIS OF ADHESION;  Surgeon: Felicie Morn, MD;  Location: [place];  Service: General;;   RADIOLOGY WITH ANESTHESIA N/A 06/16/2021   Procedure: MRI CERVICAL THORACIC LUMBER AND SACRUM;  Surgeon: Radiologist, Medication, MD;  Location: [place];  Service: Radiology;  Laterality: N/A;   RADIOLOGY WITH ANESTHESIA N/A 06/18/2021   Procedure: MRI WITH ANESTHESIA;  Surgeon: Radiologist, Medication, MD;   Location: [place];  Service: Radiology;  Laterality: N/A;   RADIOLOGY WITH ANESTHESIA N/A 06/20/2021   Procedure: MRI WITH ANESTHESIA;  Surgeon: Radiologist, Medication, MD;  Location: [place];  Service: Radiology;  Laterality: N/A;   UMBILICAL HERNIA REPAIR N/A 04/30/2021   Procedure: OPEN UMBILICAL AND EPIGASTRIC HERNIA REPAIR;  Surgeon: Felicie Morn, MD;  Location: [place];  Service: General;  Laterality: N/A;    No family history on file.  Social History   Socioeconomic History   Marital status: Single    Spouse name: Not on file   Number of children: 1   Years of education: Not on file   Highest education level: High school graduate  Occupational History    Comment: pending disability  Tobacco Use   Smoking status: Some Days    Types: Cigarettes    Last attempt to quit: 04/28/2020    Years since quitting: 1.9   Smokeless tobacco: Never  Vaping Use   Vaping Use: Never used  Substance and Sexual Activity   Alcohol use: Not Currently    Comment: before admission   Drug use: Not Currently    Types: Cocaine    Comment: past history   Sexual activity: Not Currently  Other Topics Concern   Not on file  Social History Narrative   ** Merged History Encounter **       Social Determinants of Health   Financial Resource Strain: Medium Risk (10/01/2021)   Overall Financial Resource Strain (CARDIA)    Difficulty of Paying Living Expenses: Somewhat hard  Food Insecurity: No Food Insecurity (10/01/2021)   Hunger Vital Sign    Worried About Running Out of Food in the Last Year: Never true    Ran Out of Food in the Last Year: Never true  Transportation Needs: No Transportation Needs (07/02/2021)   PRAPARE - Hydrologist (Medical): No    Lack of Transportation (Non-Medical): No  Physical Activity: Not on file  Stress: Not on file  Social Connections: Not on file  Intimate Partner Violence: Not on file    Review of Systems  All other systems  reviewed and are negative.       Objective    BP (!) 152/109   Pulse 82   Temp 98.1 F (36.7 C) (Oral)   Resp 16   Ht '5\' 6"'$  (1.676 m)   Wt 242 lb 12.8 oz (110.1 kg)   SpO2 95%   BMI 39.19 kg/m   Physical Exam Vitals and nursing note reviewed.  Constitutional:      General: He is not in acute distress.    Appearance: He is obese.  Cardiovascular:     Rate and Rhythm: Regular rhythm.  Pulmonary:     Effort: Pulmonary effort is normal.     Breath sounds: Normal breath sounds.  Abdominal:     Palpations: Abdomen is soft.     Tenderness: There is no abdominal tenderness.  Musculoskeletal:     Right lower leg: No  edema.     Left lower leg: No edema.  Neurological:     General: No focal deficit present.     Mental Status: He is alert and oriented to person, place, and time.         Assessment & Plan:   1. Primary hypertension Elevated readings. Meds refilled. Compliance discussed.  - losartan (COZAAR) 25 MG tablet; Take 1 tablet (25 mg total) by mouth daily.  Dispense: 90 tablet; Refill: 1 - spironolactone (ALDACTONE) 25 MG tablet; Take 1 tablet (25 mg total) by mouth daily.  Dispense: 90 tablet; Refill: 1  2. Transient atrial fibrillation (HCC) Meds refilled.  - metoprolol succinate (TOPROL-XL) 100 MG 24 hr tablet; Take 1 tablet (100 mg total) by mouth daily. Take with or immediately following a meal.  Dispense: 90 tablet; Refill: 1  3. Anxiety and depression Meds refilled.   4. Class 2 severe obesity due to excess calories with serious comorbidity and body mass index (BMI) of 39.0 to 39.9 in adult [facility])      Return in about 4 weeks (around 05/03/2022) for follow up.   Becky Sax, MD

## 2022-04-12 ENCOUNTER — Other Ambulatory Visit: Payer: Self-pay | Admitting: *Deleted

## 2022-04-12 ENCOUNTER — Other Ambulatory Visit: Payer: Self-pay

## 2022-04-12 ENCOUNTER — Ambulatory Visit: Payer: Self-pay

## 2022-04-12 DIAGNOSIS — I4891 Unspecified atrial fibrillation: Secondary | ICD-10-CM

## 2022-04-12 DIAGNOSIS — I10 Essential (primary) hypertension: Secondary | ICD-10-CM

## 2022-04-12 MED ORDER — SERTRALINE HCL 25 MG PO TABS
25.0000 mg | ORAL_TABLET | Freq: Every morning | ORAL | 1 refills | Status: DC
  Filled 2022-04-12 (×2): qty 90, 90d supply, fill #0

## 2022-04-12 MED ORDER — LOSARTAN POTASSIUM 25 MG PO TABS
25.0000 mg | ORAL_TABLET | Freq: Every day | ORAL | 1 refills | Status: DC
  Filled 2022-04-12 (×2): qty 90, 90d supply, fill #0

## 2022-04-12 MED ORDER — SPIRONOLACTONE 25 MG PO TABS
25.0000 mg | ORAL_TABLET | Freq: Every day | ORAL | 1 refills | Status: DC
  Filled 2022-04-12 (×2): qty 90, 90d supply, fill #0

## 2022-04-12 MED ORDER — METOPROLOL SUCCINATE ER 100 MG PO TB24
100.0000 mg | ORAL_TABLET | Freq: Every day | ORAL | 1 refills | Status: DC
  Filled 2022-04-12 (×2): qty 90, 90d supply, fill #0

## 2022-04-12 NOTE — Telephone Encounter (Signed)
PEC is on the line and say patient doesn't have enough money for his medication, but dr.wilson told him that he could get it sent to [facility] instead of his usually pharmacy for a cheaper/discounted price. Would you like to speak with them about it?   Medication was sent to [facility] '@[id number]'$ 

## 2022-04-12 NOTE — Telephone Encounter (Signed)
  Chief Complaint: medication assistance  Symptoms: BP elevated and HR  Frequency: today Pertinent Negatives: NA Disposition: '[]'$ ED /'[]'$ Urgent Care (no appt availability in office) / '[]'$ Appointment(In office/virtual)/ '[]'$  [facility]/ '[]'$ Home Care/ '[]'$ Refused Recommended Disposition /'[]'$ [facility] [facility]/ '[x]'$  Follow-up with PCP Additional Notes: pt calling because he is unable to get HTN [facility] from [facility] d/t no money and bank account frozen. Pt states that Dr. Redmond Pulling told him at [facility] that if he is unable to get meds to call back, called and spoke with Luellen Pucker, CMA. She states she will send to [facility] and see if pt is able to get from there. Advised pt to check with pharmacy in 1 hr to FU to see if he will be able to get medications. Pt provided with phone #.   Reason for Disposition  [1] Caller has NON-URGENT medicine question about med that PCP prescribed AND [2] triager unable to answer question  Answer Assessment - Initial Assessment Questions 1. NAME of MEDICINE: "What medicine(s) are you calling about?"     BP meds 2. QUESTION: "What is your question?" (e.g., double dose of medicine, side effect)     [facility] to get [facility] d/t bank account froze and no money to afford them 3. PRESCRIBER: "Who prescribed the medicine?" Reason: if prescribed by specialist, call should be referred to that group.     Dr. Redmond Pulling  4. SYMPTOMS: "Do you have any symptoms?" If Yes, ask: "What symptoms are you having?"  "How bad are the symptoms (e.g., mild, moderate, severe)     BP elevated and HR increased  Protocols used: Medication Question Call-A-AH

## 2022-04-21 ENCOUNTER — Ambulatory Visit (HOSPITAL_BASED_OUTPATIENT_CLINIC_OR_DEPARTMENT_OTHER): Payer: Medicaid Other | Admitting: Orthopaedic Surgery

## 2022-04-28 ENCOUNTER — Encounter (HOSPITAL_BASED_OUTPATIENT_CLINIC_OR_DEPARTMENT_OTHER): Payer: Self-pay

## 2022-04-28 ENCOUNTER — Ambulatory Visit (HOSPITAL_BASED_OUTPATIENT_CLINIC_OR_DEPARTMENT_OTHER): Payer: Medicaid Other | Attending: Neurosurgery

## 2022-04-28 ENCOUNTER — Ambulatory Visit (HOSPITAL_BASED_OUTPATIENT_CLINIC_OR_DEPARTMENT_OTHER): Payer: Medicaid Other

## 2022-05-04 ENCOUNTER — Ambulatory Visit (INDEPENDENT_AMBULATORY_CARE_PROVIDER_SITE_OTHER): Payer: Medicaid Other | Admitting: Family Medicine

## 2022-05-04 ENCOUNTER — Encounter: Payer: Self-pay | Admitting: Family Medicine

## 2022-05-04 VITALS — BP 159/95 | HR 57 | Temp 98.0°F | Resp 16 | Wt 245.0 lb

## 2022-05-04 DIAGNOSIS — Z6839 Body mass index (BMI) 39.0-39.9, adult: Secondary | ICD-10-CM

## 2022-05-04 DIAGNOSIS — I10 Essential (primary) hypertension: Secondary | ICD-10-CM

## 2022-05-04 MED ORDER — LOSARTAN POTASSIUM 50 MG PO TABS: 50.0000 mg | ORAL_TABLET | Freq: Every day | ORAL | 0 refills | Status: DC

## 2022-05-04 NOTE — Progress Notes (Signed)
Established Patient Office Visit  Subjective    Patient ID: Logan Nelson, male    DOB: [birth year]  Age: 57 y.o. MRN: [id number]  CC: No chief complaint on file.   HPI Logan Nelson presents for routine follow up of hypertension. Patient denies acute complaints or concerns.    Outpatient Encounter Medications as of 05/04/2022  Medication Sig   losartan (COZAAR) 50 MG tablet Take 1 tablet (50 mg total) by mouth daily.   metoprolol succinate (TOPROL-XL) 100 MG 24 hr tablet Take 1 tablet (100 mg total) by mouth daily. Take with or immediately following a meal.   sertraline (ZOLOFT) 25 MG tablet Take 1 tablet (25 mg total) by mouth every morning.   spironolactone (ALDACTONE) 25 MG tablet Take 1 tablet (25 mg total) by mouth daily.   [DISCONTINUED] losartan (COZAAR) 25 MG tablet Take 1 tablet (25 mg total) by mouth daily.   senna-docusate (SENOKOT-S) 8.6-50 MG tablet Take 1 tablet by mouth 2 (two) times daily. (Patient not taking: Reported on 09/21/2021)   No facility-administered encounter medications on file as of 05/04/2022.    Past Medical History:  Diagnosis Date   Anemia    Arthritis    Bradycardia    Dyspnea    Falling episodes    GERD (gastroesophageal reflux disease)    Hernia of abdominal wall    Hypertension    Lower extremity edema     Past Surgical History:  Procedure Laterality Date   ABDOMINAL SURGERY     ANTERIOR CERVICAL DECOMP/DISCECTOMY FUSION N/A 06/23/2021   Procedure: Anterior cervical Decompression and epidural Abcess evacuation with arthodesis cervical Four -five;  Surgeon: Ashok Pall, MD;  Location: [place];  Service: Neurosurgery;  Laterality: N/A;   INGUINAL HERNIA REPAIR Right 04/30/2021   Procedure: HERNIA REPAIR INGUINAL WITH MESH;  Surgeon: Stechschulte, Nickola Major, MD;  Location: [place];  Service: General;  Laterality: Right;   LYSIS OF ADHESION  04/30/2021   Procedure: LYSIS OF ADHESION;  Surgeon: Felicie Morn, MD;  Location: [place];  Service: General;;   RADIOLOGY WITH ANESTHESIA N/A 06/16/2021   Procedure: MRI CERVICAL THORACIC LUMBER AND SACRUM;  Surgeon: Radiologist, Medication, MD;  Location: [place];  Service: Radiology;  Laterality: N/A;   RADIOLOGY WITH ANESTHESIA N/A 06/18/2021   Procedure: MRI WITH ANESTHESIA;  Surgeon: Radiologist, Medication, MD;  Location: [place];  Service: Radiology;  Laterality: N/A;   RADIOLOGY WITH ANESTHESIA N/A 06/20/2021   Procedure: MRI WITH ANESTHESIA;  Surgeon: Radiologist, Medication, MD;  Location: [place];  Service: Radiology;  Laterality: N/A;   UMBILICAL HERNIA REPAIR N/A 04/30/2021   Procedure: OPEN UMBILICAL AND EPIGASTRIC HERNIA REPAIR;  Surgeon: Felicie Morn, MD;  Location: [place];  Service: General;  Laterality: N/A;    No family history on file.  Social History   Socioeconomic History   Marital status: Single    Spouse name: Not on file   Number of children: 1   Years of education: Not on file   Highest education level: High school graduate  Occupational History    Comment: pending disability  Tobacco Use   Smoking status: Some Days    Types: Cigarettes    Last attempt to quit: 04/28/2020    Years since quitting: 2.0   Smokeless tobacco: Never  Vaping Use   Vaping Use: Never used  Substance and Sexual Activity   Alcohol use: Not Currently    Comment: before admission   Drug use: Not Currently  Types: Cocaine    Comment: past history   Sexual activity: Not Currently  Other Topics Concern   Not on file  Social History Narrative   ** Merged History Encounter **       Social Determinants of Health   Financial Resource Strain: Medium Risk (10/01/2021)   Overall Financial Resource Strain (CARDIA)    Difficulty of Paying Living Expenses: Somewhat hard  Food Insecurity: No Food Insecurity (10/01/2021)   Hunger Vital Sign    Worried About Running Out of Food in the Last Year: Never true    Ran Out of Food in the Last Year: Never true  Transportation  Needs: No Transportation Needs (07/02/2021)   PRAPARE - Hydrologist (Medical): No    Lack of Transportation (Non-Medical): No  Physical Activity: Not on file  Stress: Not on file  Social Connections: Not on file  Intimate Partner Violence: Not on file    Review of Systems  All other systems reviewed and are negative.       Objective    BP (!) 159/95   Pulse (!) 57   Temp 98 F (36.7 C) (Oral)   Resp 16   Wt 245 lb (111.1 kg)   SpO2 95%   BMI 39.54 kg/m   Physical Exam Vitals and nursing note reviewed.  Constitutional:      General: He is not in acute distress.    Appearance: He is obese.  Cardiovascular:     Rate and Rhythm: Regular rhythm.  Pulmonary:     Effort: Pulmonary effort is normal.     Breath sounds: Normal breath sounds.  Abdominal:     Palpations: Abdomen is soft.     Tenderness: There is no abdominal tenderness.  Musculoskeletal:     Right lower leg: No edema.     Left lower leg: No edema.  Neurological:     General: No focal deficit present.     Mental Status: He is alert and oriented to person, place, and time.         Assessment & Plan:   1. Primary hypertension Slightly elevated readings. Will increase losartan from 25 mg to 50 mg daily.   2. Class 2 severe obesity due to excess calories with serious comorbidity and body mass index (BMI) of 39.0 to 39.9 in adult [facility])     Return in about 3 months (around 08/04/2022) for follow up.   Becky Sax, MD

## 2022-05-20 ENCOUNTER — Ambulatory Visit (INDEPENDENT_AMBULATORY_CARE_PROVIDER_SITE_OTHER): Payer: Medicaid Other | Admitting: Orthopaedic Surgery

## 2022-05-20 DIAGNOSIS — M25511 Pain in right shoulder: Secondary | ICD-10-CM | POA: Diagnosis not present

## 2022-05-20 DIAGNOSIS — M19011 Primary osteoarthritis, right shoulder: Secondary | ICD-10-CM

## 2022-05-20 MED ORDER — TRIAMCINOLONE ACETONIDE 40 MG/ML IJ SUSP
80.0000 mg | INTRAMUSCULAR | Status: AC | PRN
  Administered 2022-05-20: 80 mg via INTRA_ARTICULAR

## 2022-05-20 MED ORDER — LIDOCAINE HCL 1 % IJ SOLN
4.0000 mL | INTRAMUSCULAR | Status: AC | PRN
  Administered 2022-05-20: 4 mL

## 2022-05-20 NOTE — Progress Notes (Signed)
Chief Complaint: Right shoulder pain     History of Present Illness:   05/20/2022: Presents today as he would like an additional ultrasound-guided glenohumeral injection of the right shoulder.  Logan Nelson is a 57 y.o. male right-hand-dominant presents with right shoulder pain which has now been ongoing for several years.  He states that he has very limited overhead activity.  He recently had an infection drained in his neck.  He is here today as a referral from Dr. Warren Danes office for right shoulder ultrasound-guided injection    Surgical History:   None  PMH/PSH/Family History/Social History/Meds/Allergies:    Past Medical History:  Diagnosis Date   Anemia    Arthritis    Bradycardia    Dyspnea    Falling episodes    GERD (gastroesophageal reflux disease)    Hernia of abdominal wall    Hypertension    Lower extremity edema    Past Surgical History:  Procedure Laterality Date   ABDOMINAL SURGERY     ANTERIOR CERVICAL DECOMP/DISCECTOMY FUSION N/A 06/23/2021   Procedure: Anterior cervical Decompression and epidural Abcess evacuation with arthodesis cervical Four -five;  Surgeon: Coletta Memos, MD;  Location: [facility] OR;  Service: Neurosurgery;  Laterality: N/A;   INGUINAL HERNIA REPAIR Right 04/30/2021   Procedure: HERNIA REPAIR INGUINAL WITH MESH;  Surgeon: Stechschulte, Hyman Hopes, MD;  Location: [facility] OR;  Service: General;  Laterality: Right;   LYSIS OF ADHESION  04/30/2021   Procedure: LYSIS OF ADHESION;  Surgeon: Quentin Ore, MD;  Location: [facility] OR;  Service: General;;   RADIOLOGY WITH ANESTHESIA N/A 06/16/2021   Procedure: MRI CERVICAL THORACIC LUMBER AND SACRUM;  Surgeon: Radiologist, Medication, MD;  Location: [facility] OR;  Service: Radiology;  Laterality: N/A;   RADIOLOGY WITH ANESTHESIA N/A 06/18/2021   Procedure: MRI WITH ANESTHESIA;  Surgeon: Radiologist, Medication, MD;  Location: [facility] OR;  Service: Radiology;  Laterality: N/A;   RADIOLOGY  WITH ANESTHESIA N/A 06/20/2021   Procedure: MRI WITH ANESTHESIA;  Surgeon: Radiologist, Medication, MD;  Location: [facility] OR;  Service: Radiology;  Laterality: N/A;   UMBILICAL HERNIA REPAIR N/A 04/30/2021   Procedure: OPEN UMBILICAL AND EPIGASTRIC HERNIA REPAIR;  Surgeon: Quentin Ore, MD;  Location: [facility] OR;  Service: General;  Laterality: N/A;   Social History   Socioeconomic History   Marital status: Single    Spouse name: Not on file   Number of children: 1   Years of education: Not on file   Highest education level: High school graduate  Occupational History    Comment: pending disability  Tobacco Use   Smoking status: Some Days    Types: Cigarettes    Last attempt to quit: 04/28/2020    Years since quitting: 2.0   Smokeless tobacco: Never  Vaping Use   Vaping Use: Never used  Substance and Sexual Activity   Alcohol use: Not Currently    Comment: before admission   Drug use: Not Currently    Types: Cocaine    Comment: past history   Sexual activity: Not Currently  Other Topics Concern   Not on file  Social History Narrative   ** Merged History Encounter **       Social Determinants of Health   Financial Resource Strain: Medium Risk (10/01/2021)   Overall Financial Resource Strain (CARDIA)  Difficulty of Paying Living Expenses: Somewhat hard  Food Insecurity: No Food Insecurity (10/01/2021)   Hunger Vital Sign    Worried About Running Out of Food in the Last Year: Never true    Ran Out of Food in the Last Year: Never true  Transportation Needs: No Transportation Needs (07/02/2021)   PRAPARE - Administrator, Civil Service (Medical): No    Lack of Transportation (Non-Medical): No  Physical Activity: Not on file  Stress: Not on file  Social Connections: Not on file   No family history on file. No Known Allergies Current Outpatient Medications  Medication Sig Dispense Refill   losartan (COZAAR) 50 MG tablet Take 1 tablet (50 mg total) by mouth  daily. 90 tablet 0   metoprolol succinate (TOPROL-XL) 100 MG 24 hr tablet Take 1 tablet (100 mg total) by mouth daily. Take with or immediately following a meal. 90 tablet 1   senna-docusate (SENOKOT-S) 8.6-50 MG tablet Take 1 tablet by mouth 2 (two) times daily. (Patient not taking: Reported on 09/21/2021)     sertraline (ZOLOFT) 25 MG tablet Take 1 tablet (25 mg total) by mouth every morning. 90 tablet 1   spironolactone (ALDACTONE) 25 MG tablet Take 1 tablet (25 mg total) by mouth daily. 90 tablet 1   No current facility-administered medications for this visit.   No results found.  Review of Systems:   A ROS was performed including pertinent positives and negatives as documented in the HPI.  Physical Exam :   Constitutional: NAD and appears stated age Neurological: Alert and oriented Psych: Appropriate affect and cooperative There were no vitals taken for this visit.   Comprehensive Musculoskeletal Exam:    Right shoulder range of motion actively passively is to 20 degrees flexion.  External rotation at the side is to neutral.  Internal rotation of the back pocket.  Significant glenohumeral pain  Imaging:   Xray (right shoulder 3 views): Advanced glenohumeral osteoarthritis    I personally reviewed and interpreted the radiographs.   Assessment:   57 y.o. male with advanced glenohumeral osteoarthritis.  Unfortunately at this time I described that I do not necessarily believe he would be a candidate for shoulder arthroplasty.  That effect I do believe that as needed injections are ideal for him.  He would like 1 of these today.  Plan :    -Right shoulder ultrasound-guided injection performed after verbal consent obtained    Procedure Note  Patient: Logan Nelson             Date of Birth: 06-02-1965           MRN: [id number]             Visit Date: 05/20/2022  Procedures: Visit Diagnoses:  No diagnosis found.   Large Joint Inj: R glenohumeral on 05/20/2022  12:28 PM Indications: pain Details: 22 G 1.5 in needle, ultrasound-guided anterior approach  Arthrogram: No  Medications: 4 mL lidocaine 1 %; 80 mg triamcinolone acetonide 40 MG/ML Outcome: tolerated well, no immediate complications Procedure, treatment alternatives, risks and benefits explained, specific risks discussed. Consent was given by the patient. Immediately prior to procedure a time out was called to verify the correct patient, procedure, equipment, support staff and site/side marked as required. Patient was prepped and draped in the usual sterile fashion.          I personally saw and evaluated the patient, and participated in the management and treatment plan.  Huel Cote, MD  Attending Physician, Orthopedic Surgery  This document was dictated using Dragon voice recognition software. A reasonable attempt at proof reading has been made to minimize errors.

## 2022-05-23 ENCOUNTER — Encounter: Payer: Self-pay | Admitting: Pulmonary Disease

## 2022-05-23 ENCOUNTER — Ambulatory Visit (INDEPENDENT_AMBULATORY_CARE_PROVIDER_SITE_OTHER): Payer: Medicaid Other | Admitting: Pulmonary Disease

## 2022-05-23 VITALS — BP 130/84 | HR 57 | Ht 66.0 in | Wt 244.0 lb

## 2022-05-23 DIAGNOSIS — F17210 Nicotine dependence, cigarettes, uncomplicated: Secondary | ICD-10-CM | POA: Diagnosis not present

## 2022-05-23 DIAGNOSIS — R0602 Shortness of breath: Secondary | ICD-10-CM | POA: Diagnosis not present

## 2022-05-23 DIAGNOSIS — R0982 Postnasal drip: Secondary | ICD-10-CM | POA: Diagnosis not present

## 2022-05-23 DIAGNOSIS — Z77120 Contact with and (suspected) exposure to mold (toxic): Secondary | ICD-10-CM

## 2022-05-23 MED ORDER — FLUTICASONE-SALMETEROL 115-21 MCG/ACT IN AERO: 2.0000 | INHALATION_SPRAY | Freq: Two times a day (BID) | RESPIRATORY_TRACT | 12 refills | Status: DC

## 2022-05-23 MED ORDER — FEXOFENADINE HCL 180 MG PO TABS: 180.0000 mg | ORAL_TABLET | Freq: Every day | ORAL | 5 refills | Status: DC

## 2022-05-23 MED ORDER — FLUTICASONE PROPIONATE 50 MCG/ACT NA SUSP: 1.0000 | Freq: Every day | NASAL | 2 refills | Status: DC
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
## 2022-06-09 ENCOUNTER — Ambulatory Visit: Payer: Medicaid Other | Admitting: Family Medicine

## 2022-06-11 ENCOUNTER — Encounter: Payer: Self-pay | Admitting: Pulmonary Disease

## 2022-07-21 ENCOUNTER — Other Ambulatory Visit: Payer: Self-pay

## 2022-07-21 ENCOUNTER — Encounter (HOSPITAL_COMMUNITY): Payer: Self-pay

## 2022-07-21 ENCOUNTER — Emergency Department (HOSPITAL_COMMUNITY)
Admission: EM | Admit: 2022-07-21 | Discharge: 2022-07-21 | Discharge status: Against Medical Advice | Payer: Medicaid Other | Attending: Emergency Medicine | Admitting: Emergency Medicine

## 2022-07-21 DIAGNOSIS — R531 Weakness: Secondary | ICD-10-CM | POA: Diagnosis not present

## 2022-07-21 DIAGNOSIS — Z79899 Other long term (current) drug therapy: Secondary | ICD-10-CM | POA: Diagnosis not present

## 2022-07-21 DIAGNOSIS — M545 Low back pain, unspecified: Secondary | ICD-10-CM | POA: Insufficient documentation

## 2022-07-21 DIAGNOSIS — S0003XA Contusion of scalp, initial encounter: Secondary | ICD-10-CM | POA: Insufficient documentation

## 2022-07-21 DIAGNOSIS — W19XXXA Unspecified fall, initial encounter: Secondary | ICD-10-CM | POA: Insufficient documentation

## 2022-07-21 DIAGNOSIS — S0990XA Unspecified injury of head, initial encounter: Secondary | ICD-10-CM | POA: Diagnosis present

## 2022-07-21 LAB — BASIC METABOLIC PANEL
Anion gap: 9 (ref 5–15)
BUN: 16 mg/dL (ref 6–20)
CO2: 24 mmol/L (ref 22–32)
Calcium: 9.1 mg/dL (ref 8.9–10.3)
Chloride: 110 mmol/L (ref 98–111)
Creatinine, Ser: 0.95 mg/dL (ref 0.61–1.24)
GFR, Estimated: >60 mL/min (ref >60)
Glucose, Bld: 108 mg/dL — ABNORMAL HIGH (ref 70–99)
Potassium: 3.9 mmol/L (ref 3.5–5.1)
Sodium: 143 mmol/L (ref 135–145)

## 2022-07-21 LAB — CBC WITH DIFFERENTIAL/PLATELET
Abs Immature Granulocytes: 0.03 10*3/uL (ref 0.00–0.07)
Basophils Absolute: 0.1 10*3/uL (ref 0.0–0.1)
Basophils Relative: 1 %
Eosinophils Absolute: 0.2 10*3/uL (ref 0.0–0.5)
Eosinophils Relative: 3 %
HCT: 45.5 % (ref 39.0–52.0)
Hemoglobin: 15 g/dL (ref 13.0–17.0)
Immature Granulocytes: 0 %
Lymphocytes Relative: 25 %
Lymphs Abs: 1.9 10*3/uL (ref 0.7–4.0)
MCH: 30.7 pg (ref 26.0–34.0)
MCHC: 33 g/dL (ref 30.0–36.0)
MCV: 93 fL (ref 80.0–100.0)
Monocytes Absolute: 0.8 10*3/uL (ref 0.1–1.0)
Monocytes Relative: 11 %
Neutro Abs: 4.6 10*3/uL (ref 1.7–7.7)
Neutrophils Relative %: 60 %
Platelets: 223 10*3/uL (ref 150–400)
RBC: 4.89 MIL/uL (ref 4.22–5.81)
RDW: 14.3 % (ref 11.5–15.5)
WBC: 7.5 10*3/uL (ref 4.0–10.5)
nRBC: 0 % (ref 0.0–0.2)

## 2022-07-21 MED ORDER — LORAZEPAM 2 MG/ML IJ SOLN
2.0000 mg | INTRAMUSCULAR | Status: DC | PRN
  Filled 2022-07-21: qty 1

## 2022-07-21 NOTE — ED Notes (Signed)
Pt left AMA... MD Jeraldine Loots aware

## 2022-07-21 NOTE — ED Triage Notes (Addendum)
Pt states he fell three times today and states both leg and back is painful. Pt states' "I can barely walk." PT states, "I fall three times every week." Pt was able to walk to triage room. Pt denies blood thinners. Pt has a hematoma on left side of head approx the size of a ping pong ball. Pt states that's from the fall last week.

## 2022-07-21 NOTE — ED Provider Notes (Signed)
[place] EMERGENCY DEPARTMENT AT [facility] Provider Note   CSN: [id number] Arrival date & time: 07/21/22  1420     History  Chief Complaint  Patient presents with   Fall    Logan Nelson is a 57 y.o. male.  HPI Patient with history of prior cervical spine surgery now presents with concern for fall as well as increased frequency of falls in general. Surgery was about 1 month ago. He notes that over the past 2 days he has had increasing falls due to weakness in his lower extremities right greater than left. He notes some discomfort in his back, though made an inconsistent pattern. No fever, vomiting, headache. He did sustain a contusion to his left parietal scalp as well.     Home Medications Prior to Admission medications   Medication Sig Start Date End Date Taking? Authorizing Provider  fexofenadine (ALLEGRA) 180 MG tablet Take 1 tablet (180 mg total) by mouth daily. 05/23/22   Martina Sinner, MD  fluticasone (FLONASE) 50 MCG/ACT nasal spray Place 1 spray into both nostrils daily. 05/23/22   Martina Sinner, MD  fluticasone-salmeterol (ADVAIR HFA) 865-78 MCG/ACT inhaler Inhale 2 puffs into the lungs 2 (two) times daily. 05/23/22   Martina Sinner, MD  losartan (COZAAR) 50 MG tablet Take 1 tablet (50 mg total) by mouth daily. 05/04/22   [name], MD  metoprolol succinate (TOPROL-XL) 100 MG 24 hr tablet Take 1 tablet (100 mg total) by mouth daily. Take with or immediately following a meal. 04/12/22   [name], MD  sertraline (ZOLOFT) 25 MG tablet Take 1 tablet (25 mg total) by mouth every morning. 04/12/22   [name], MD  spironolactone (ALDACTONE) 25 MG tablet Take 1 tablet (25 mg total) by mouth daily. 04/12/22   [name], MD      Allergies    Patient has no known allergies.    Review of Systems   Review of Systems  All other systems reviewed and are negative.   Physical Exam Updated Vital Signs BP (!) 165/105 (BP Location:  Left Arm)   Pulse 62   Temp 98.2 F (36.8 C) (Oral)   Resp 20   Ht 5\' 6"  (1.676 m)   Wt 110.7 kg   SpO2 100%   BMI 39.39 kg/m  Physical Exam Vitals and nursing note reviewed.  Constitutional:      General: He is not in acute distress.    Appearance: He is well-developed.  HENT:     Head: Normocephalic and atraumatic.  Eyes:     Conjunctiva/sclera: Conjunctivae normal.  Cardiovascular:     Rate and Rhythm: Normal rate and regular rhythm.  Pulmonary:     Effort: Pulmonary effort is normal. No respiratory distress.     Breath sounds: No stridor.  Abdominal:     General: There is no distension.  Skin:    General: Skin is warm and dry.  Neurological:     Mental Status: He is alert and oriented to person, place, and time.     Cranial Nerves: Cranial nerves 2-12 are intact.     Comments: Upper extremity strength symmetric 5/5. Lower extremity right proximal strength 2/5, left 4/5.  Distal strength symmetric.     ED Results / Procedures / Treatments   Labs (all labs ordered are listed, but only abnormal results are displayed) Labs Reviewed  BASIC METABOLIC PANEL - Abnormal; Notable for the following components:      Result Value  Glucose, Bld 108 (*)    All other components within normal limits  CBC WITH DIFFERENTIAL/PLATELET    EKG None  Radiology No results found.  Procedures Procedures    Medications Ordered in ED Medications  LORazepam (ATIVAN) injection 2 mg (has no administration in time range)    ED Course/ Medical Decision Making/ A&P                             Medical Decision Making Patient presents with lower extremity weakness, frequency of falls in the context of prior cervical spine surgery. Patient is awake, alert, afebrile, not hypotensive, lower suspicion for infection, greater concern for inflammation or postop fluid collection given the patient's prior MRI cervical spine with this following his procedure.   Amount and/or Complexity  of Data Reviewed External Data Reviewed: notes. Labs: ordered. Decision-making details documented in ED Course. Radiology: ordered and independent interpretation performed. Decision-making details documented in ED Course.  Risk Prescription drug management. Decision regarding hospitalization. Diagnosis or treatment significantly limited by social determinants of health.   9:27 PM Labs unremarkable, patient remained in no distress for several hours in the ED., prior to his receiving his MRI patient stated that he was hungry, decided to leave AGAINST MEDICAL ADVICE.  The patient was encouraged to stay, he made this decision, has capacity to do so.        Final Clinical Impression(s) / ED Diagnoses Final diagnoses:  Weakness    Rx / DC Orders ED Discharge Orders     None         Gerhard Munch, MD 07/21/22 2128

## 2022-07-22 ENCOUNTER — Telehealth: Payer: Self-pay

## 2022-07-22 NOTE — Transitions of Care (Post Inpatient/ED Visit) (Signed)
   07/22/2022  Name: Logan Nelson MRN: [id number] DOB: 1965-05-04  Today's TOC FU Call Status: Today's TOC FU Call Status:: Successful TOC FU Call Competed TOC FU Call Complete Date: 07/22/22  Transition Care Management Follow-up Telephone Call Date of Discharge: 07/22/22 Discharge Facility: [facility]) Type of Discharge: Emergency Department Reason for ED Visit:  (weakness/pain) How have you been since you were released from the hospital?: Worse Any questions or concerns?: Yes (Patient feels he may need to go back into a living facility)  Items Reviewed: Did you receive and understand the discharge instructions provided?: Yes Any new allergies since your discharge?: No Dietary orders reviewed?: No  Medications Reviewed Today: Medications Reviewed Today     Reviewed by Martina Sinner, MD (Physician) on 06/11/22 at 1350  Med List Status: <None>   Medication Order Taking? Sig Documenting Provider Last Dose Status Informant  fexofenadine (ALLEGRA) 180 MG tablet [id number] Yes Take 1 tablet (180 mg total) by mouth daily. Martina Sinner, MD  Active   fluticasone Aleda Grana) 50 MCG/ACT nasal spray [id number] Yes Place 1 spray into both nostrils daily. Martina Sinner, MD  Active   fluticasone-salmeterol (ADVAIR [facility]) 528-41 MCG/ACT inhaler [id number] Yes Inhale 2 puffs into the lungs 2 (two) times daily. Martina Sinner, MD  Active   losartan (COZAAR) 50 MG tablet [id number] Yes Take 1 tablet (50 mg total) by mouth daily. [name], MD Taking Active   metoprolol succinate (TOPROL-XL) 100 MG 24 hr tablet [id number] Yes Take 1 tablet (100 mg total) by mouth daily. Take with or immediately following a meal. [name], MD Taking Active   sertraline (ZOLOFT) 25 MG tablet [id number] Yes Take 1 tablet (25 mg total) by mouth every morning. [name], MD Taking Active   spironolactone (ALDACTONE) 25 MG tablet [id number] Yes Take 1 tablet (25 mg total) by mouth daily.  [name], MD Taking Active             Home Care and Equipment/Supplies:    Functional Questionnaire: Do you need assistance with bathing/showering or dressing?: No Do you need assistance with meal preparation?: No Do you need assistance with eating?: No Do you have difficulty maintaining continence: No Do you need assistance with getting out of bed/getting out of a chair/moving?: No Do you have difficulty managing or taking your medications?: No  Follow up appointments reviewed: PCP Follow-up appointment confirmed?: NA Specialist Hospital Follow-up appointment confirmed?: NA Do you understand care options if your condition(s) worsen?: Yes-patient verbalized understanding   SDOH Interventions    Flowsheet Row ED to Hosp-Admission (Discharged) from 06/16/2021 in [facility] 3E HF PCU  SDOH Interventions   Food Insecurity Interventions Intervention Not Indicated  Housing Interventions Intervention Not Indicated  Transportation Interventions Intervention Not Indicated       [name], [name], [name]  Managed [name] Social Worker [phone]

## 2022-07-25 ENCOUNTER — Other Ambulatory Visit: Payer: Medicaid Other

## 2022-07-25 NOTE — Patient Outreach (Signed)
Medicaid [facility] Social Work Note  07/25/2022 Name:  Logan Nelson MRN:  [id number] DOB:  09/04/1965  Logan Nelson is an 57 y.o. year old male who is a primary patient of Logan Skeans, MD.  The Medicaid [facility] Coordination team was consulted for assistance with:  Community Resources   [name] was given information about Medicaid [place] [facility] Coordination team services today. Logan Nelson Patient agreed to services and verbal consent obtained.  Engaged with patient  for by telephone forfollow up visit in response to referral for case management and/or care coordination services.   Assessments/Interventions:  Review of past medical history, allergies, medications, Nelson status, including review of consultants reports, laboratory and other test data, was performed as part of comprehensive evaluation and provision of chronic care management services.  SDOH: (Social Determinant of Nelson) assessments and interventions performed: SDOH Interventions    Flowsheet Row ED to Hosp-Admission (Discharged) from 06/16/2021 in [facility] 3E HF PCU  SDOH Interventions   Food Insecurity Interventions Intervention Not Indicated  Housing Interventions Intervention Not Indicated  Transportation Interventions Intervention Not Indicated     Logan Nelson completed a telephone outreach with patient, he stated he did give his PCP a call on Friday to follow up from his ED visit. Patient states he is in pain. Logan Nelson will send a message to PCP about patient getting an appointment. Patient states he is currently living in a motel and would like to get housing.  Advanced Directives Status:  Not addressed in this encounter.  Care Plan                 No Known Allergies  Medications Reviewed Today     Reviewed by Logan Sinner, MD (Physician) on 06/11/22 at 1350  Med List Status: <None>   Medication Order Taking? Sig Documenting Provider Last Dose Status Informant   fexofenadine (ALLEGRA) 180 MG tablet [id number] Yes Take 1 tablet (180 mg total) by mouth daily. Logan Sinner, MD  Active   fluticasone Aleda Grana) 50 MCG/ACT nasal spray [id number] Yes Place 1 spray into both nostrils daily. Logan Sinner, MD  Active   fluticasone-salmeterol (ADVAIR [facility]) 086-57 MCG/ACT inhaler [id number] Yes Inhale 2 puffs into the lungs 2 (two) times daily. Logan Sinner, MD  Active   losartan (COZAAR) 50 MG tablet [id number] Yes Take 1 tablet (50 mg total) by mouth daily. Logan Skeans, MD Taking Active   metoprolol succinate (TOPROL-XL) 100 MG 24 hr tablet [id number] Yes Take 1 tablet (100 mg total) by mouth daily. Take with or immediately following a meal. Logan Skeans, MD Taking Active   sertraline (ZOLOFT) 25 MG tablet [id number] Yes Take 1 tablet (25 mg total) by mouth every morning. Logan Skeans, MD Taking Active   spironolactone (ALDACTONE) 25 MG tablet [id number] Yes Take 1 tablet (25 mg total) by mouth daily. 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   Epidural abscess    Acute systolic (congestive) heart failure (HCC) 06/17/2021   Prolonged QT interval 06/17/2021   MSSA bacteremia    Pressure ulcer of sacral region, stage 3 (HCC)    Acute heart failure with preserved ejection fraction (HFpEF) (HCC) 06/13/2021   Anasarca    Pressure injury of sacral region, unstageable (HCC) 05/25/2021   Pressure injury of buttock, stage 2 (HCC)  05/14/2021   Hypomagnesemia 05/13/2021   Vitamin D deficiency 05/13/2021   Generalized weakness 05/12/2021   Hypophosphatemia 05/12/2021   Lower extremity weakness 05/11/2021   Hypokalemia 05/11/2021   Anemia of chronic disease 05/11/2021   GERD (gastroesophageal reflux disease) 05/11/2021   Bilateral inguinal hernia 04/23/2021   Protein-calorie malnutrition, severe 04/14/2021   Protein calorie  malnutrition (HCC) 04/13/2021   Protein-calorie malnutrition (HCC) 04/12/2021   Hepatic steatosis 04/12/2021   Stroke (HCC) 04/11/2021   Small bowel obstruction (HCC) 10/13/2020   Anemia due to vitamin B12 deficiency 10/13/2020   Hypertension 01/09/2015   Incisional hernia 01/07/2015    Conditions to be addressed/monitored per PCP order:   community resources  There are no care plans that you recently modified to display for this patient.   Follow up:  Patient agrees to Care Plan and Follow-up.  Plan: The [place] Medicaid care management team will reach out to the patient again over the next 15 days.  Date/time of next scheduled Social Work care management/care coordination outreach:  08/15/22  Logan Nelson, Logan Nelson, Logan Nelson  [facility] Social Worker [phone]

## 2022-07-25 NOTE — Patient Instructions (Signed)
Visit Information  Logan Nelson was given information about Medicaid Managed Care team care coordination services as a part of their [facility] Community Plan Medicaid benefit. Logan Nelson verbally consented to engagement with the [facility] Managed Care team.   If you are experiencing a medical emergency, please call 911 or report to your local emergency department or urgent care.   If you have a non-emergency medical problem during routine business hours, please contact your provider's office and ask to speak with a nurse.   For questions related to your [facility], please call: [phone] or visit the homepage here: [email or web address]  If you would like to schedule transportation through your [facility], please call the following number at least 2 days in advance of your appointment: [phone]   Rides for urgent appointments can also be made after hours by calling Member Services.  Call the Behavioral Health Crisis Line at [phone], at any time, 24 hours a day, 7 days a week. If you are in danger or need immediate medical attention call 911.  If you would like help to quit smoking, call 1-800-QUIT-NOW (727 643 2112) OR Espaol: 1-855-Djelo-Ya (1-027-253-6644) o para ms informacin haga clic aqu or Text READY to 034-742 to register via text  Mr. Trabert - following are the goals we discussed in your visit today:   Goals Addressed   None       Social Worker will follow up in 15 days.   [name], [name], MHA [facility] Health  Managed Medicaid Social Worker [phone]   Following is a copy of your plan of care:  There are no care plans that you recently modified to display for this patient.

## 2022-08-15 ENCOUNTER — Ambulatory Visit: Payer: Medicaid Other

## 2022-08-17 ENCOUNTER — Ambulatory Visit: Payer: Self-pay

## 2022-08-17 NOTE — Telephone Encounter (Signed)
Pt called reporting that he is thinking of going to the ED because he cannot walk.   Left message to call back about symptoms.

## 2022-08-17 NOTE — Telephone Encounter (Signed)
Second attempt to reach pt. Left message to call back. 

## 2022-08-17 NOTE — Telephone Encounter (Signed)
Patient was called and advise to go to ER if he is having a medical emergency. Patient understood

## 2022-08-17 NOTE — Telephone Encounter (Signed)
Third attempt to reach pt. Left message to call back. 

## 2022-08-17 NOTE — Telephone Encounter (Signed)
  Chief Complaint: unable to walk- patient thinks his infection in spine may have returned Symptoms: back pian- unable to walk- weakness in legs Frequency: chronic- but worse today   Disposition: [x] ED /[] Urgent Care (no appt availability in office) / [] Appointment(In office/virtual)/ []  [facility]/ [] Home Care/ [] Refused Recommended Disposition /[] [facility]/ []  Follow-up with PCP Additional Notes: Patient advised to call 911 for transport to hospital- patient declines - he states he is at a friends home and does not want EMT/ambulance coming there. Patient advised if he does not have transportation- he needs to call 911- especially if he is unable to get up and walk. Patient states if he does not go to ED tonight- he will go tomorrow. Declines 911   Reason for Disposition  [1] SEVERE weakness (i.e., unable to walk or barely able to walk, requires support) AND [2] new-onset or worsening  Answer Assessment - Initial Assessment Questions 1. DESCRIPTION: "Describe how you are feeling."     Weakness in the and pain in legs- R 2. SEVERITY: "How bad is it?"  "Can you stand and walk?"   - MILD (0-3): Feels weak or tired, but does not interfere with work, school or normal activities.   - MODERATE (4-7): Able to stand and walk; weakness interferes with work, school, or normal activities.   - SEVERE (8-10): Unable to stand or walk; unable to do usual activities.     severe 3. ONSET: "When did these symptoms begin?" (e.g., hours, days, weeks, months)     Ongoing- patient states he is getting worse  6. OTHER SYMPTOMS: "Do you have any other symptoms?" (e.g., chest pain, fever, cough, SOB, vomiting, diarrhea, bleeding, other areas of pain)     Back pain  Protocols used: Weakness (Generalized) and Fatigue-A-AH

## 2022-08-18 ENCOUNTER — Ambulatory Visit: Payer: Medicaid Other

## 2022-08-18 NOTE — Patient Instructions (Signed)
Visit Information  [name] was given information about Medicaid Managed Care team care coordination services as a part of their [name] Community Plan Medicaid benefit. Logan Nelson verbally consented to engagement with the [facility] Managed Care team.   If you are experiencing a medical emergency, please call 911 or report to your local emergency department or urgent care.   If you have a non-emergency medical problem during routine business hours, please contact your provider's office and ask to speak with a nurse.   For questions related to your [facility], please call: [phone] or visit the homepage here: [email or web address]  If you would like to schedule transportation through your [facility], please call the following number at least 2 days in advance of your appointment: [phone]   Rides for urgent appointments can also be made after hours by calling Member Services.  Call the Behavioral Health Crisis Line at [phone], at any time, 24 hours a day, 7 days a week. If you are in danger or need immediate medical attention call 911.  If you would like help to quit smoking, call 1-800-QUIT-NOW ((410)353-3156) OR Espaol: 1-855-Djelo-Ya (3-664-403-4742) o para ms informacin haga clic aqu or Text READY to 595-638 to register via text  Mr. [name] - following are the goals we discussed in your visit today:   Goals Addressed   None       Social Worker will follow up in 30 days.   [name], [name], MHA [facility] Health  Managed Medicaid Social Worker [phone]   Following is a copy of your plan of care:  There are no care plans that you recently modified to display for this patient.

## 2022-08-18 NOTE — Patient Outreach (Signed)
Medicaid Managed Care Social Work Note  08/18/2022 Name:  Logan Nelson MRN:  [id number] DOB:  07-Feb-1966  Logan Nelson is an 57 y.o. year old male who is a primary patient of [name], MD.  The Medicaid Managed Care Coordination team was consulted for assistance with:  Community Resources   Logan Nelson was given information about Medicaid Managed Care Coordination team services today. Logan Nelson Patient agreed to services and verbal consent obtained.  Engaged with patient  for by telephone forfollow up visit in response to referral for case management and/or care coordination services.   Assessments/Interventions:  Review of past medical history, allergies, medications, health status, including review of consultants reports, laboratory and other test data, was performed as part of comprehensive evaluation and provision of chronic care management services.  SDOH: (Social Determinant of Health) assessments and interventions performed: SDOH Interventions    Flowsheet Row ED to Hosp-Admission (Discharged) from 06/16/2021 in [facility] 3E HF PCU  SDOH Interventions   Food Insecurity Interventions Intervention Not Indicated  Housing Interventions Intervention Not Indicated  Transportation Interventions Intervention Not Indicated      BSW completed a telephone outreach with patient, he states he is currently living with a friend and is wanting to move. He is not responsible for rent or utilities right now. Patient stated he would like some resources to move and for food. BSW will mail patient resources. Advanced Directives Status:  Not addressed in this encounter.  Care Plan                 No Known Allergies  Medications Reviewed Today     Reviewed by Martina Sinner, MD (Physician) on 06/11/22 at 1350  Med List Status: <None>   Medication Order Taking? Sig Documenting Provider Last Dose Status Informant  fexofenadine (ALLEGRA) 180 MG tablet  [id number] Yes Take 1 tablet (180 mg total) by mouth daily. Martina Sinner, MD  Active   fluticasone Aleda Grana) 50 MCG/ACT nasal spray [id number] Yes Place 1 spray into both nostrils daily. Martina Sinner, MD  Active   fluticasone-salmeterol (ADVAIR [facility]) 086-57 MCG/ACT inhaler [id number] Yes Inhale 2 puffs into the lungs 2 (two) times daily. Martina Sinner, MD  Active   losartan (COZAAR) 50 MG tablet [id number] Yes Take 1 tablet (50 mg total) by mouth daily. [name], MD Taking Active   metoprolol succinate (TOPROL-XL) 100 MG 24 hr tablet [id number] Yes Take 1 tablet (100 mg total) by mouth daily. Take with or immediately following a meal. [name], MD Taking Active   sertraline (ZOLOFT) 25 MG tablet [id number] Yes Take 1 tablet (25 mg total) by mouth every morning. [name], MD Taking Active   spironolactone (ALDACTONE) 25 MG tablet [id number] Yes Take 1 tablet (25 mg total) by mouth daily. 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   Epidural abscess    Acute systolic (congestive) heart failure (HCC) 06/17/2021   Prolonged QT interval 06/17/2021   MSSA bacteremia    Pressure ulcer of sacral region, stage 3 (HCC)    Acute heart failure with preserved ejection fraction (HFpEF) (HCC) 06/13/2021   Anasarca    Pressure injury of sacral region, unstageable (HCC) 05/25/2021   Pressure injury of buttock, stage 2 (HCC) 05/14/2021   Hypomagnesemia 05/13/2021   Vitamin D deficiency  05/13/2021   Generalized weakness 05/12/2021   Hypophosphatemia 05/12/2021   Lower extremity weakness 05/11/2021   Hypokalemia 05/11/2021   Anemia of chronic disease 05/11/2021   GERD (gastroesophageal reflux disease) 05/11/2021   Bilateral inguinal hernia 04/23/2021   Protein-calorie malnutrition, severe 04/14/2021   Protein calorie malnutrition (HCC) 04/13/2021   Protein-calorie  malnutrition (HCC) 04/12/2021   Hepatic steatosis 04/12/2021   Stroke (HCC) 04/11/2021   Small bowel obstruction (HCC) 10/13/2020   Anemia due to vitamin B12 deficiency 10/13/2020   Hypertension 01/09/2015   Incisional hernia 01/07/2015    Conditions to be addressed/monitored per PCP order:   community resources  There are no care plans that you recently modified to display for this patient.   Follow up:  Patient agrees to Care Plan and Follow-up.  Plan: The Managed Medicaid care management team will reach out to the patient again over the next 30 days.  Date/time of next scheduled Social Work care management/care coordination outreach:  09/18/22  [name], [name], [facility] Health  Managed [facility] Social Worker [phone]

## 2022-09-14 ENCOUNTER — Encounter (HOSPITAL_COMMUNITY): Payer: Self-pay

## 2022-09-14 ENCOUNTER — Emergency Department (HOSPITAL_COMMUNITY)
Admission: EM | Admit: 2022-09-14 | Discharge: 2022-09-14 | Disposition: A | Discharge status: Home / Self Care | Payer: Medicaid Other | Attending: Emergency Medicine | Admitting: Emergency Medicine

## 2022-09-14 ENCOUNTER — Emergency Department (HOSPITAL_COMMUNITY)
Admission: EM | Admit: 2022-09-14 | Discharge: 2022-09-15 | Disposition: A | Discharge status: Home / Self Care | Payer: Medicaid Other | Source: Home / Self Care | Attending: Emergency Medicine | Admitting: Emergency Medicine

## 2022-09-14 ENCOUNTER — Emergency Department (HOSPITAL_COMMUNITY): Payer: Medicaid Other

## 2022-09-14 ENCOUNTER — Other Ambulatory Visit: Payer: Self-pay

## 2022-09-14 DIAGNOSIS — M546 Pain in thoracic spine: Secondary | ICD-10-CM | POA: Diagnosis not present

## 2022-09-14 DIAGNOSIS — Z981 Arthrodesis status: Secondary | ICD-10-CM | POA: Diagnosis not present

## 2022-09-14 DIAGNOSIS — M549 Dorsalgia, unspecified: Secondary | ICD-10-CM

## 2022-09-14 DIAGNOSIS — Z79899 Other long term (current) drug therapy: Secondary | ICD-10-CM | POA: Insufficient documentation

## 2022-09-14 DIAGNOSIS — I10 Essential (primary) hypertension: Secondary | ICD-10-CM | POA: Insufficient documentation

## 2022-09-14 DIAGNOSIS — R531 Weakness: Secondary | ICD-10-CM | POA: Diagnosis present

## 2022-09-14 DIAGNOSIS — M47817 Spondylosis without myelopathy or radiculopathy, lumbosacral region: Secondary | ICD-10-CM | POA: Diagnosis not present

## 2022-09-14 DIAGNOSIS — M50322 Other cervical disc degeneration at C5-C6 level: Secondary | ICD-10-CM | POA: Diagnosis not present

## 2022-09-14 DIAGNOSIS — G061 Intraspinal abscess and granuloma: Secondary | ICD-10-CM | POA: Diagnosis not present

## 2022-09-14 DIAGNOSIS — M47816 Spondylosis without myelopathy or radiculopathy, lumbar region: Secondary | ICD-10-CM | POA: Diagnosis not present

## 2022-09-14 DIAGNOSIS — M545 Low back pain, unspecified: Secondary | ICD-10-CM | POA: Insufficient documentation

## 2022-09-14 DIAGNOSIS — M5104 Intervertebral disc disorders with myelopathy, thoracic region: Secondary | ICD-10-CM | POA: Diagnosis not present

## 2022-09-14 DIAGNOSIS — R29898 Other symptoms and signs involving the musculoskeletal system: Secondary | ICD-10-CM

## 2022-09-14 DIAGNOSIS — M48061 Spinal stenosis, lumbar region without neurogenic claudication: Secondary | ICD-10-CM | POA: Diagnosis not present

## 2022-09-14 DIAGNOSIS — M4807 Spinal stenosis, lumbosacral region: Secondary | ICD-10-CM | POA: Diagnosis not present

## 2022-09-14 DIAGNOSIS — M5459 Other low back pain: Secondary | ICD-10-CM | POA: Diagnosis not present

## 2022-09-14 MED ORDER — OXYCODONE-ACETAMINOPHEN 5-325 MG PO TABS
1.0000 | ORAL_TABLET | Freq: Once | ORAL | Status: AC
  Administered 2022-09-15: 1 via ORAL
  Filled 2022-09-14: qty 1

## 2022-09-14 MED ORDER — LORAZEPAM 1 MG PO TABS
1.0000 mg | ORAL_TABLET | Freq: Once | ORAL | Status: AC
  Administered 2022-09-14: 1 mg via ORAL
  Filled 2022-09-14: qty 1

## 2022-09-14 NOTE — ED Provider Notes (Signed)
[place] EMERGENCY DEPARTMENT AT [facility] Provider Note   CSN: [id number] Arrival date & time: 09/14/22  1628     History  Chief Complaint  Patient presents with   Extremity Weakness    Logan Nelson is a 57 y.o. male with past medical history of previous cervical spine surgery secondary to epidural abscess presents to the ED complaining of weakness in both of his legs.  States that this has been going on for a couple of months now but has been worse now associated with mid to lower back pain as well as episodes of bowel and bladder incontinence.  No known fevers at home.  States that the weakness in his legs is causing him to fall frequently.  Unable to obtain further history as patient states that he is unwilling to remain waiting in the ED as he is thirsty and hungry and want to follow-up with his specialist tomorrow.      Home Medications Prior to Admission medications   Medication Sig Start Date End Date Taking? Authorizing Provider  fexofenadine (ALLEGRA) 180 MG tablet Take 1 tablet (180 mg total) by mouth daily. 05/23/22   Martina Sinner, MD  fluticasone (FLONASE) 50 MCG/ACT nasal spray Place 1 spray into both nostrils daily. 05/23/22   Martina Sinner, MD  fluticasone-salmeterol (ADVAIR [facility]) 952-84 MCG/ACT inhaler Inhale 2 puffs into the lungs 2 (two) times daily. 05/23/22   Martina Sinner, MD  losartan (COZAAR) 50 MG tablet Take 1 tablet (50 mg total) by mouth daily. 05/04/22   [name], MD  metoprolol succinate (TOPROL-XL) 100 MG 24 hr tablet Take 1 tablet (100 mg total) by mouth daily. Take with or immediately following a meal. 04/12/22   [name], MD  sertraline (ZOLOFT) 25 MG tablet Take 1 tablet (25 mg total) by mouth every morning. 04/12/22   [name], MD  spironolactone (ALDACTONE) 25 MG tablet Take 1 tablet (25 mg total) by mouth daily. 04/12/22   [name], MD      Allergies    Patient has no known allergies.    Review  of Systems   Review of Systems  Unable to perform ROS: Other    Physical Exam Updated Vital Signs BP (!) 155/99   Pulse (!) 101   Temp 98.6 F (37 C) (Oral)   Resp 16   SpO2 98%  Physical Exam Vitals and nursing note reviewed.  Constitutional:      General: He is not in acute distress.    Appearance: He is not toxic-appearing.  HENT:     Head: Normocephalic and atraumatic.     Mouth/Throat:     Mouth: Mucous membranes are moist.  Eyes:     Extraocular Movements: Extraocular movements intact.  Cardiovascular:     Rate and Rhythm: Normal rate and regular rhythm.  Pulmonary:     Effort: Pulmonary effort is normal.     Breath sounds: Normal breath sounds.  Abdominal:     General: Abdomen is flat.     Palpations: Abdomen is soft.  Musculoskeletal:     Cervical back: Normal range of motion and neck supple.     Comments: 3/5 strength to proximal bilateral lower extremities, 4/5 strength to distal bilateral lower extremities, 5/5 symmetric strength to bilateral upper extremities, ambulatory with difficulty  Neurological:     Mental Status: He is alert and oriented to person, place, and time.     Cranial Nerves: Cranial nerves 2-12 are intact.  ED Results / Procedures / Treatments   Labs (all labs ordered are listed, but only abnormal results are displayed) Labs Reviewed - No data to display  EKG None  Radiology No results found.  Procedures Procedures    Medications Ordered in ED Medications - No data to display  ED Course/ Medical Decision Making/ A&P                                 Medical Decision Making  Medical Decision Making:   Logan Nelson is a 57 y.o. male who presented to the ED today with leg weakness detailed above.    Patient's presentation is complicated by their history of previous epidural abscess.  Complete initial physical exam performed, notably the patient was nontoxic-appearing.  He did have weakness to the bilateral lower  extremities as above but was ambulatory.    Reviewed and confirmed nursing documentation for past medical history, family history, social history.    Initial Assessment:   With the patient's presentation, CVA, spinal cord injury, ACS, arrhythmia, syncope, orthostatic hypotension, sepsis, hypoglycemia, hypoxia, electrolyte disturbance, endocrine disorder, anemia, environmental exposure, polypharmacy, epidural abscess/infectious process.   This is most consistent with an acute complicated illness  Initial Plan:  Objective evaluation as below reviewed   Initial Study Results:   Laboratory  Pt declined  Radiology:  Pt declined     Final Assessment and Plan:   57 year old male presents the ED complaining of lower extremity weakness.  He has had the symptoms intermittently for some time now.  Also admits to bowel and bladder dysfunction.  He has weakness particularly to the proximal lower extremities bilaterally.  After brief assessment of patient, patient asking if he can have something to eat and drink and what the timeframe for getting an MRI will be.  Discussed with patient that it is difficult to accurately predict how long he will wait for an MRI but it would likely be hours.  With this, patient states that he does not want to wait and will follow-up with his specialist in the morning and return to the ED if needed.  Discussed risk/benefits of staying versus leaving AMA and patient will opt to leave AGAINST MEDICAL ADVICE.  Patient ambulatory out of ED.   Clinical Impression:  1. Weakness of both lower extremities      AMA           Final Clinical Impression(s) / ED Diagnoses Final diagnoses:  Weakness of both lower extremities    Rx / DC Orders ED Discharge Orders     None         Richardson Dopp 09/14/22 1943    Lorre Nick, MD 09/15/22 2245

## 2022-09-14 NOTE — ED Triage Notes (Signed)
Pt states he has bilateral leg weakness that has been an ongoing issue for a few weeks that he state has came from " an infection on his spine ". States it started getting worse two weeks ago. States the leg weakness makes it difficult to walk but he can still ambulate. Pt also endorses back pain associated with the leg pain.

## 2022-09-14 NOTE — ED Triage Notes (Signed)
Pt is checking in for same reason, states he left and he was taken out of system. Coming back for MRI related to back pain and leg weakness

## 2022-09-14 NOTE — ED Provider Notes (Signed)
Logan Nelson Provider Note   CSN: [id number] Arrival date & time: 09/14/22  2016     History  Chief Complaint  Patient presents with   Back Pain    Logan Nelson is a 57 y.o. 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  Patient states this has been going on for a couple of months now.  He has associated mid to lower back pain as well as episodes of bladder incontinence.  He states he falls frequently due to the ongoing weakness in his legs.  Patient was in the ED earlier, but did not want to stay to wait for imaging at that time.  He returns now requesting MRI.  No known fevers at home.  Denies saddle anesthesia, numbness, syncope, headache, dizziness, light-headedness, visual disturbance, neck pain, neck stiffness, fever.  He reports he has had 6 falls this week.  Patient concerned he will reach a point of not being able to walk like last year, which resulted in him becoming hospitalized.        Home Medications Prior to Admission medications   Medication Sig Start Date End Date Taking? Authorizing Provider  fexofenadine (ALLEGRA) 180 MG tablet Take 1 tablet (180 mg total) by mouth daily. 05/23/22   Martina Sinner, MD  fluticasone (FLONASE) 50 MCG/ACT nasal spray Place 1 spray into both nostrils daily. 05/23/22   Martina Sinner, MD  fluticasone-salmeterol (ADVAIR HFA) 409-81 MCG/ACT inhaler Inhale 2 puffs into the lungs 2 (two) times daily. 05/23/22   Martina Sinner, MD  losartan (COZAAR) 50 MG tablet Take 1 tablet (50 mg total) by mouth daily. 05/04/22   [name], MD  metoprolol succinate (TOPROL-XL) 100 MG 24 hr tablet Take 1 tablet (100 mg total) by mouth daily. Take with or immediately following a meal. 04/12/22   [name], MD  sertraline (ZOLOFT) 25 MG tablet Take 1 tablet (25 mg total) by mouth  every morning. 04/12/22   [name], MD  spironolactone (ALDACTONE) 25 MG tablet Take 1 tablet (25 mg total) by mouth daily. 04/12/22   [name], MD      Allergies    Patient has no known allergies.    Review of Systems   Review of Systems  Constitutional:  Negative for fever.  Eyes:  Negative for visual disturbance.  Musculoskeletal:  Positive for back pain. Negative for neck pain and neck stiffness.  Neurological:  Positive for weakness. Negative for dizziness, syncope, light-headedness and headaches.    Physical Exam Updated Vital Signs BP 136/85   Pulse 69   Temp 97.9 F (36.6 C)   Resp 17   SpO2 98%  Physical Exam Vitals and nursing note reviewed.  Constitutional:      General: He is not in acute distress.    Appearance: Normal appearance. He is not ill-appearing or diaphoretic.  HENT:     Head: Mass (left parietal, 1 cm, mobile, rubbery and tender) present.  Cardiovascular:     Rate and Rhythm: Normal rate and regular rhythm.  Pulmonary:     Effort: Pulmonary effort is normal.  Musculoskeletal:     Thoracic back: Tenderness (midline) present.     Lumbar back: Tenderness (midline) present.     Comments: Patient able to change positions from lying to sitting to standing without assistance, but does have increase in back pain.  Skin:    General: Skin is warm and dry.     Capillary Refill: Capillary refill takes less than 2 seconds.  Neurological:     Mental Status: He is alert and oriented to person, place, and time. Mental status is at baseline.     GCS: GCS eye subscore is 4. GCS verbal subscore is 5. GCS motor subscore is 6.     Cranial Nerves: Cranial nerves 2-12 are intact. No cranial nerve deficit.     Sensory: Sensory deficit present.     Motor: Weakness present. No tremor or pronator drift.     Coordination: Coordination is intact.     Gait: Gait is intact.     Comments: Cranial Nerves:  II: peripheral fields grossly intact III,IV, VI: ptosis not  present, extra-ocular movements intact bilaterally, direct and consensual pupillary light reflexes intact bilaterally V: facial sensation, jaw opening, and bite strength equal bilaterally VII: eyebrow raise, eyelid close, smile, frown, pucker equal bilaterally VIII: hearing grossly normal bilaterally  IX,X: palate elevation and swallowing intact XI: bilateral shoulder shrug and lateral head rotation equal and strong XII: midline tongue extension  Motor: 5/5 strength in BUE; 3/5 strength in RLE and 4/5 strength in LLE.   Decreased sensation on the RLE when compared with LLE.  Patient able to ambulate without assistance.  Psychiatric:        Mood and Affect: Mood normal.        Behavior: Behavior normal.     ED Results / Procedures / Treatments   Labs (all labs ordered are listed, but only abnormal results are displayed) Labs Reviewed  COMPREHENSIVE METABOLIC PANEL - Abnormal; Notable for the following components:      Result Value   CO2 16 (*)    Glucose, Bld 101 (*)    All other components within normal limits  URINALYSIS, W/ REFLEX TO CULTURE (INFECTION SUSPECTED) - Abnormal; Notable for the following components:   APPearance HAZY (*)    Leukocytes,Ua MODERATE (*)    All other components within normal limits  CULTURE, BLOOD (ROUTINE X 2)  CULTURE, BLOOD (ROUTINE X 2)  CBC WITH DIFFERENTIAL/PLATELET  CBC WITH DIFFERENTIAL/PLATELET    EKG None  Radiology MR THORACIC SPINE WO CONTRAST  Result Date: 09/15/2022 CLINICAL DATA:  Thoracic myelopathy EXAM: MRI THORACIC SPINE WITHOUT CONTRAST TECHNIQUE: Multiplanar, multisequence MR imaging of the thoracic spine was performed. No intravenous contrast was administered. COMPARISON:  None Available. FINDINGS: Alignment:  Physiologic. Vertebrae: Modic type 1 endplate signal changes at T7-8. No acute fracture. Cord:  Normal signal and morphology. Paraspinal and other soft tissues: Negative. Disc levels: Mild multilevel degenerative disc  disease without spinal canal or neural foraminal stenosis. IMPRESSION: 1. No acute abnormality of the thoracic spine. 2. Mild multilevel degenerative disc disease without spinal canal or neural foraminal stenosis. Electronically Signed   By: Deatra Robinson M.D.   On: 09/15/2022 00:21    Procedures Procedures    Medications Ordered in ED Medications  LORazepam (ATIVAN) injection 1 mg (has no administration in time range)  LORazepam (ATIVAN) tablet 1 mg (1 mg Oral Given 09/14/22 2312)  oxyCODONE-acetaminophen (PERCOCET/ROXICET) 5-325 MG per tablet 1 tablet (1 tablet Oral Given 09/15/22 0025)  morphine (PF) 4 MG/ML injection 4 mg (4 mg Intravenous Given 09/15/22 0135)    ED Course/ Medical Decision Making/ A&P  Medical Decision Making Amount and/or Complexity of Data Reviewed Labs: ordered. Radiology: ordered.  Risk Prescription drug management.   This patient presents to the ED with chief complaint(s) of back pain, increasing weakness and falls, bladder incontinence with pertinent past medical history of cervical epidural abscess, HTN, stroke, lower extremity weakness, HFpEF.  The complaint involves an extensive differential diagnosis and also carries with it a high risk of complications and morbidity.    The differential diagnosis includes epidural abscess, degenerative disc disease, stenosis, cauda equina  The initial plan is to obtain MRI, patient initially refusing IV, labs stating he only wants the MRI done and "you will have my blood from last week"  Additional history obtained: Records reviewed previous admission documents  Initial Assessment:   Exam significant for overall well-appearing patient who is not in acute distress.  CN II through XII grossly intact.  5/5 strength in BUE.  3/5 strength in RLE and 4/5 strength in LLE.  Decree sensation on the RLE when compared to the LLE.  Patient able to ambulate without assistance.  No pronator drift.  Grip  strength is equal.  Abdomen is soft and nontender to palpation.  Independent visualization and interpretation of imaging: I independently visualized the following imaging with scope of interpretation limited to determining acute life threatening conditions related to emergency care: MRI thoracic, which revealed mild multilevel degenerative disc disease without spinal canal or neural foraminal stenosis.  Informed by MRI that patient was unable to complete MR lumbar at the time.  Patient was complaining of severe back pain and was not able to remain still for imaging.  Treatment and Reassessment: Patient was given oral pain medicine without much relief in symptoms.  Patient also given oral Ativan for MRI anxiety.  Discussed with patient benefits versus risks of not having complete workup including blood work and MRI with and without contrast due to patient's history of epidural abscess.  Patient initially refused, but after shared medical decision making conversation, patient is agreeable to IV, labs.  Patient given IV morphine with improvement in pain, he is able to rest comfortably.  Upon reassessment, patient is sleeping.  Independent ECG/labs interpretation:  The following labs were independently interpreted:  CBC without leukocytosis or anemia.  Metabolic panel without major electrolyte disturbance.  Renal and hepatic function are both normal.  UA with moderate amount of leukocytes but no bacteria.  Blood cultures were obtained and are in process.  Disposition:   6:42 AM Care of patient transferred to Fayrene Helper, PA-C at the end of my shift as the patient will require reassessment once labs/imaging have resulted. Patient presentation, ED course, and plan of care discussed with review of all pertinent labs and imaging. Please see his/her note for further details regarding further ED course and disposition. Plan at time of handoff is follow up on MRI imaging and determine disposition.  If imaging  shows no surgical emergency or other emergent finding and patient is able to ambulate without difficulty, will be appropriate for discharge with outpatient follow-up.  This may be altered or completely changed at the discretion of the oncoming team pending results of further workup.           Final Clinical Impression(s) / ED Diagnoses Final diagnoses:  Weakness of both lower extremities  Acute midline back pain, unspecified back location    Rx / DC Orders ED Discharge Orders     None         Lenard Simmer, PA-C 09/15/22  [id number]    Tilden Fossa, MD 09/17/22 [phone]

## 2022-09-15 ENCOUNTER — Emergency Department (HOSPITAL_COMMUNITY): Payer: Medicaid Other

## 2022-09-15 DIAGNOSIS — G061 Intraspinal abscess and granuloma: Secondary | ICD-10-CM | POA: Diagnosis not present

## 2022-09-15 DIAGNOSIS — M47816 Spondylosis without myelopathy or radiculopathy, lumbar region: Secondary | ICD-10-CM | POA: Diagnosis not present

## 2022-09-15 DIAGNOSIS — R531 Weakness: Secondary | ICD-10-CM | POA: Diagnosis not present

## 2022-09-15 DIAGNOSIS — Z981 Arthrodesis status: Secondary | ICD-10-CM | POA: Diagnosis not present

## 2022-09-15 DIAGNOSIS — M4807 Spinal stenosis, lumbosacral region: Secondary | ICD-10-CM | POA: Diagnosis not present

## 2022-09-15 DIAGNOSIS — M47817 Spondylosis without myelopathy or radiculopathy, lumbosacral region: Secondary | ICD-10-CM | POA: Diagnosis not present

## 2022-09-15 DIAGNOSIS — M48061 Spinal stenosis, lumbar region without neurogenic claudication: Secondary | ICD-10-CM | POA: Diagnosis not present

## 2022-09-15 DIAGNOSIS — M5104 Intervertebral disc disorders with myelopathy, thoracic region: Secondary | ICD-10-CM | POA: Diagnosis not present

## 2022-09-15 DIAGNOSIS — M50322 Other cervical disc degeneration at C5-C6 level: Secondary | ICD-10-CM | POA: Diagnosis not present

## 2022-09-15 LAB — CBC WITH DIFFERENTIAL/PLATELET
Abs Immature Granulocytes: 0.03 10*3/uL (ref 0.00–0.07)
Basophils Absolute: 0.1 10*3/uL (ref 0.0–0.1)
Basophils Relative: 1 %
Eosinophils Absolute: 0.3 10*3/uL (ref 0.0–0.5)
Eosinophils Relative: 4 %
HCT: 44.6 % (ref 39.0–52.0)
Hemoglobin: 14.3 g/dL (ref 13.0–17.0)
Immature Granulocytes: 0 %
Lymphocytes Relative: 30 %
Lymphs Abs: 2.7 10*3/uL (ref 0.7–4.0)
MCH: 28.9 pg (ref 26.0–34.0)
MCHC: 32.1 g/dL (ref 30.0–36.0)
MCV: 90.3 fL (ref 80.0–100.0)
Monocytes Absolute: 0.8 10*3/uL (ref 0.1–1.0)
Monocytes Relative: 9 %
Neutro Abs: 5 10*3/uL (ref 1.7–7.7)
Neutrophils Relative %: 56 %
Platelets: 210 10*3/uL (ref 150–400)
RBC: 4.94 MIL/uL (ref 4.22–5.81)
RDW: 14.4 % (ref 11.5–15.5)
WBC: 8.9 10*3/uL (ref 4.0–10.5)
nRBC: 0 % (ref 0.0–0.2)

## 2022-09-15 LAB — COMPREHENSIVE METABOLIC PANEL
ALT: 17 U/L (ref 0–44)
AST: 20 U/L (ref 15–41)
Albumin: 3.7 g/dL (ref 3.5–5.0)
Alkaline Phosphatase: 91 U/L (ref 38–126)
Anion gap: 11 (ref 5–15)
BUN: 18 mg/dL (ref 6–20)
CO2: 16 mmol/L — ABNORMAL LOW (ref 22–32)
Calcium: 9 mg/dL (ref 8.9–10.3)
Chloride: 110 mmol/L (ref 98–111)
Creatinine, Ser: 0.83 mg/dL (ref 0.61–1.24)
GFR, Estimated: >60 mL/min (ref >60)
Glucose, Bld: 101 mg/dL — ABNORMAL HIGH (ref 70–99)
Potassium: 4.7 mmol/L (ref 3.5–5.1)
Sodium: 137 mmol/L (ref 135–145)
Total Bilirubin: 0.7 mg/dL (ref 0.3–1.2)
Total Protein: 6.9 g/dL (ref 6.5–8.1)

## 2022-09-15 LAB — CULTURE, BLOOD (ROUTINE X 2)
Culture: NO GROWTH
Culture: NO GROWTH
Special Requests: ADEQUATE

## 2022-09-15 LAB — URINALYSIS, W/ REFLEX TO CULTURE (INFECTION SUSPECTED)
Bacteria, UA: NONE SEEN
Bilirubin Urine: NEGATIVE
Glucose, UA: NEGATIVE mg/dL
Hgb urine dipstick: NEGATIVE
Ketones, ur: NEGATIVE mg/dL
Nitrite: NEGATIVE
Protein, ur: NEGATIVE mg/dL
Specific Gravity, Urine: 1.021 (ref 1.005–1.030)
pH: 5 (ref 5.0–8.0)

## 2022-09-15 MED ORDER — LORAZEPAM 2 MG/ML IJ SOLN
1.0000 mg | Freq: Once | INTRAMUSCULAR | Status: AC
  Administered 2022-09-15: 1 mg via INTRAVENOUS
  Filled 2022-09-15: qty 1

## 2022-09-15 MED ORDER — MORPHINE SULFATE (PF) 4 MG/ML IV SOLN
4.0000 mg | Freq: Once | INTRAVENOUS | Status: AC
  Administered 2022-09-15: 4 mg via INTRAVENOUS
  Filled 2022-09-15: qty 1

## 2022-09-15 MED ORDER — LORAZEPAM 1 MG PO TABS: 1.0000 mg | ORAL_TABLET | Freq: Once | ORAL | Status: DC

## 2022-09-15 NOTE — Discharge Instructions (Signed)
You have been evaluated for your symptoms.  Fortunately MRIs of your spine did not show any concerning findings.  You may take over-the-counter Tylenol or ibuprofen as needed for pain.  Please call and follow-up closely with your primary care doctor with neurosurgeon as needed.  Return if you have any concern.

## 2022-09-15 NOTE — ED Notes (Signed)
Patient transported to MRI 

## 2022-09-15 NOTE — ED Provider Notes (Signed)
Received signout from previous provider, please see her note for complete H&P.  In short this is a 57 year old male prior epidural abscess of cervical spine requiring surgery on 06/23/2021 by neurosurgeon Dr. Franky Macho who presents to the ED with complaints of generalized weakness to both his legs ongoing for the past several months.  Does endorse bladder incontinence and frequent falls  -Labs ordered, independently viewed and interpreted by me.  Labs remarkable for normal WBC -The patient was maintained on a cardiac monitor.  I personally viewed and interpreted the cardiac monitored which showed an underlying rhythm of: sinus brady -Imaging independently viewed and interpreted by me and I agree with radiologist's interpretation.  Result remarkable for MRI of cervical/thoracic/lumbar without acute changes -This patient presents to the ED for concern of legs weakness, this involves an extensive number of treatment options, and is a complaint that carries with it a high risk of complications and morbidity.  The differential diagnosis includes spinal stenosis, epidural abscess, caudal equina, chronic back pain, stroke -Co morbidities that complicate the patient evaluation includes stroke, epidural abscess, HTN -Treatment includes ativan, percocet, morphine -Reevaluation of the patient after these medicines showed that the patient improved -PCP office notes or outside notes reviewed -Discussion with specialist neurosurgeon Dr. Franky Macho who have reviewed MRIs and felt pt does not need any emergent intervention.  -Escalation to admission/observation considered: patients feels much better, is comfortable with discharge, and will follow up with PCP -Prescription medication considered, patient comfortable with OTC meds -Social Determinant of Health considered which includes tobacco use, financial constraint, housing constraint  BP (!) 140/86 (BP Location: Right Arm)   Pulse (!) 58   Temp 97.8 F (36.6 C)  (Oral)   Resp 18   SpO2 99%   Results for orders placed or performed during the hospital encounter of 09/14/22  Blood culture (routine x 2)   Specimen: BLOOD  Result Value Ref Range   Specimen Description BLOOD LEFT ANTECUBITAL    Special Requests      BOTTLES DRAWN AEROBIC AND ANAEROBIC Blood Culture results may not be optimal due to an excessive volume of blood received in culture bottles   Culture      NO GROWTH < 12 HOURS Performed at [facility], [address], [place], [place] [address]    Report Status PENDING   Blood culture (routine x 2)   Specimen: BLOOD LEFT HAND  Result Value Ref Range   Specimen Description BLOOD LEFT HAND    Special Requests      BOTTLES DRAWN AEROBIC AND ANAEROBIC Blood Culture adequate volume   Culture      NO GROWTH < 12 HOURS Performed at [facility], [address] [place], [place] [address]    Report Status PENDING   Comprehensive metabolic panel  Result Value Ref Range   Sodium 137 135 - 145 mmol/L   Potassium 4.7 3.5 - 5.1 mmol/L   Chloride 110 98 - 111 mmol/L   CO2 16 (L) 22 - 32 mmol/L   Glucose, Bld 101 (H) 70 - 99 mg/dL   BUN 18 6 - 20 mg/dL   Creatinine, Ser 1.02 0.61 - 1.24 mg/dL   Calcium 9.0 8.9 - 72.5 mg/dL   Total Protein 6.9 6.5 - 8.1 g/dL   Albumin 3.7 3.5 - 5.0 g/dL   AST 20 15 - 41 U/L   ALT 17 0 - 44 U/L   Alkaline Phosphatase 91 38 - 126 U/L   Total Bilirubin  0.7 0.3 - 1.2 mg/dL   GFR, Estimated >14 >78 mL/min   Anion gap 11 5 - 15  Urinalysis, w/ Reflex to Culture (Infection Suspected) -Urine, Clean Catch  Result Value Ref Range   Specimen Source URINE, CLEAN CATCH    Color, Urine YELLOW YELLOW   APPearance HAZY (A) CLEAR   Specific Gravity, Urine 1.021 1.005 - 1.030   pH 5.0 5.0 - 8.0   Glucose, UA NEGATIVE NEGATIVE mg/dL   Hgb urine dipstick NEGATIVE NEGATIVE   Bilirubin Urine NEGATIVE NEGATIVE   Ketones, ur NEGATIVE NEGATIVE mg/dL   Protein, ur NEGATIVE NEGATIVE mg/dL   Nitrite NEGATIVE  NEGATIVE   Leukocytes,Ua MODERATE (A) NEGATIVE   RBC / HPF 0-5 0 - 5 RBC/hpf   WBC, UA 21-50 0 - 5 WBC/hpf   Bacteria, UA NONE SEEN NONE SEEN   Squamous Epithelial / HPF 6-10 0 - 5 /HPF   Mucus PRESENT   CBC with Differential/Platelet  Result Value Ref Range   WBC 8.9 4.0 - 10.5 K/uL   RBC 4.94 4.22 - 5.81 MIL/uL   Hemoglobin 14.3 13.0 - 17.0 g/dL   HCT 29.5 62.1 - 30.8 %   MCV 90.3 80.0 - 100.0 fL   MCH 28.9 26.0 - 34.0 pg   MCHC 32.1 30.0 - 36.0 g/dL   RDW 65.7 84.6 - 96.2 %   Platelets 210 150 - 400 K/uL   nRBC 0.0 0.0 - 0.2 %   Neutrophils Relative % 56 %   Neutro Abs 5.0 1.7 - 7.7 K/uL   Lymphocytes Relative 30 %   Lymphs Abs 2.7 0.7 - 4.0 K/uL   Monocytes Relative 9 %   Monocytes Absolute 0.8 0.1 - 1.0 K/uL   Eosinophils Relative 4 %   Eosinophils Absolute 0.3 0.0 - 0.5 K/uL   Basophils Relative 1 %   Basophils Absolute 0.1 0.0 - 0.1 K/uL   Immature Granulocytes 0 %   Abs Immature Granulocytes 0.03 0.00 - 0.07 K/uL   MR LUMBAR SPINE WO CONTRAST  Result Date: 09/15/2022 CLINICAL DATA:  Lower back pain, lower extremity weakness. History of epidural abscess. EXAM: MRI LUMBAR SPINE WITHOUT CONTRAST TECHNIQUE: Multiplanar, multisequence MR imaging of the lumbar spine was performed. No intravenous contrast was administered. COMPARISON:  Lumbar spine MRI 06/18/2021 FINDINGS: Segmentation: Standard; the lowest formed disc space is designated L5-S1. Alignment:  Normal. Vertebrae: Vertebral body heights are preserved. Background marrow signal is normal. There is multilevel degenerative endplate signal abnormality with mild edema at T11-T12, L1-L2, L4-L5, and L5-S1, similar to the prior study from 2023. There is no other marrow edema. T2/STIR hyperintensity along the posterior L5 endplate there is favored to reflect engorged venous plexus. No endplate irregularity or destruction to suggest discitis/osteomyelitis. There is no evidence of septic arthritis. Conus medullaris and cauda equina:  Conus extends to the T12-L1 level. Conus and cauda equina appear normal. Paraspinal and other soft tissues: There is trace perifacetal soft tissue edema bilaterally at L4-L5 without associated osseous erosion/destruction. The paraspinal soft tissues are otherwise unremarkable. Disc levels: T12-L1: No significant spinal canal or neural foraminal stenosis L1-L2: There is a mild disc bulge without significant spinal canal or neural foraminal stenosis L2-L3: There is a mild disc bulge and mild facet arthropathy resulting in mild bilateral neural foraminal stenosis without significant spinal canal stenosis, unchanged. L3-L4: There is a minimal disc bulge and mild facet arthropathy without significant spinal canal or neural foraminal stenosis, unchanged. L4-L5: There is a diffuse disc bulge, endplate  spurring, and moderate left and mild right facet arthropathy with ligamentum flavum thickening resulting in mild spinal canal and bilateral subarticular zone narrowing, and moderate bilateral neural foraminal stenosis, unchanged. L5-S1: There is a diffuse disc bulge, endplate spurring, and mild bilateral facet arthropathy resulting in mild bilateral subarticular zone narrowing and severe right and moderate left neural foraminal stenosis, unchanged. IMPRESSION: 1. No convincing evidence of discitis/osteomyelitis in the spine. T2 hyperintensity along the posterior L5 endplate is favored to reflect engorged venous plexus has a posterior epidural abscess. 2. Multilevel degenerative changes as above is overall not significantly changed since 2023, resulting in up to mild spinal canal stenosis at L4-L5 and severe right and moderate left neural foraminal stenosis at L5-S1. 3. Trace bilateral perifacetal soft tissue edema at L4-L5 without associated osseous erosion or destruction, favored degenerative in nature. Electronically Signed   By: Lesia Hausen M.D.   On: 09/15/2022 09:29   MR CERVICAL SPINE WO CONTRAST  Result Date:  09/15/2022 CLINICAL DATA:  57 year old male with weakness. 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 Postoperative changes since that time. EXAM: MRI CERVICAL SPINE WITHOUT CONTRAST TECHNIQUE: Multiplanar, multisequence MR imaging of the cervical spine was performed. No intravenous contrast was administered. COMPARISON:  Intraoperative radiographs 06/23/2021. Preoperative cervical spine MRI 06/20/2021 and earlier. FINDINGS: Study is intermittently degraded by motion artifact despite repeated imaging attempts. Alignment: Mild residual reversal of cervical lordosis. No significant spondylolisthesis. Vertebrae: C4-C5 ACDF hardware artifact is new from the previous MRI. Underlying heterogeneous but generally maintained T1 marrow signal throughout the cervical spine. No evidence of marrow edema or acute osseous abnormality on sagittal stir. Cord: Substantially limited evaluation due to persistent motion artifact on sagittal and axial T2 weighted imaging. But patency of the mid cervical spinal canal at the C4 level appears improved postoperatively. Posterior Fossa, vertebral arteries, paraspinal tissues: Cervicomedullary junction is within normal limits. Chronic probable lacunar infarct of the pons on series 3, image 7 is stable. Otherwise negative visible posterior fossa. Limited neck soft tissue evaluation due to persistent motion artifact. Disc levels: Limited due to motion. C2-C3 level central T2 hyperintensity on series 7, image 7 could be a small new disc protrusion or small volume residual epidural fluid. The C3-C4 level appears grossly negative now. C4-C5 ACDF is new.  No convincing spinal stenosis at that level. Disc and endplate degeneration at C5-C6 and C6-C7 with no definite spinal stenosis. C7-T1 and the visible upper thoracic levels appear negative. IMPRESSION: 1. Motion degraded exam despite repeated imaging attempts. Especially limited spinal cord and spinal canal  detail. 2. No active discitis osteomyelitis identified. C4-C5 ACDF with improved spinal canal patency there. 3. But possible small volume residual ventral epidural fluid at the C2-C3 level, or alternatively a small disc protrusion. But no evidence of moderate or severe cervical spinal stenosis. Electronically Signed   By: Odessa Fleming M.D.   On: 09/15/2022 09:19   MR THORACIC SPINE WO CONTRAST  Result Date: 09/15/2022 CLINICAL DATA:  Thoracic myelopathy EXAM: MRI THORACIC SPINE WITHOUT CONTRAST TECHNIQUE: Multiplanar, multisequence MR imaging of the thoracic spine was performed. No intravenous contrast was administered. COMPARISON:  None Available. FINDINGS: Alignment:  Physiologic. Vertebrae: Modic type 1 endplate signal changes at T7-8. No acute fracture. Cord:  Normal signal and morphology. Paraspinal and other soft tissues: Negative. Disc levels: Mild multilevel degenerative disc disease without spinal canal or neural foraminal stenosis. IMPRESSION: 1. No acute abnormality of the thoracic spine. 2. Mild multilevel degenerative disc disease without  spinal canal or neural foraminal stenosis. Electronically Signed   By: Deatra Robinson M.D.   On: 09/15/2022 00:21       Fayrene Helper, PA-C 09/15/22 1035    Terald Sleeper, MD 09/15/22 1230

## 2022-09-16 ENCOUNTER — Telehealth: Payer: Self-pay

## 2022-09-16 NOTE — Transitions of Care (Post Inpatient/ED Visit) (Signed)
   09/16/2022  Name: Logan Nelson MRN: [id number] DOB: [birth year]  Today's TOC FU Call Status: Today's TOC FU Call Status:: Successful TOC FU Call Completed TOC FU Call Complete Date: 09/16/22  Transition Care Management Follow-up Telephone Call Date of Discharge: 09/14/22 Discharge Facility: [facility]) Type of Discharge: Emergency Department Reason for ED Visit:  (weakness in legs) How have you been since you were released from the hospital?: Better Any questions or concerns?: Yes Patient Questions/Concerns:: Patient states he cannot afford his medications. He does receieve 948 monthly, he states he is homeless.  Items Reviewed: Did you receive and understand the discharge instructions provided?: Yes Medications obtained,verified, and reconciled?: No (No medication prescribed.) Any new allergies since your discharge?: No Dietary orders reviewed?: No Do you have support at home?: No  Medications Reviewed Today: Medications Reviewed Today   Medications were not reviewed in this encounter     Home Care and Equipment/Supplies: Were Home Health Services Ordered?: NA Any new equipment or medical supplies ordered?: NA  Functional Questionnaire: Do you need assistance with bathing/showering or dressing?: No Do you need assistance with meal preparation?: No Do you need assistance with eating?: No Do you have difficulty maintaining continence: No Do you need assistance with getting out of bed/getting out of a chair/moving?: No Do you have difficulty managing or taking your medications?: No  Follow up appointments reviewed: PCP Follow-up appointment confirmed?: No (Patient states he called but PCP will not give him anything for pain) MD Provider Line [phone] Given: No Specialist Hospital Follow-up appointment confirmed?: No Do you need transportation to your follow-up appointment?: No Do you understand care options if your condition(s) worsen?: Yes-patient  verbalized understanding   SDOH Interventions    Flowsheet Row ED to Hosp-Admission (Discharged) from 06/16/2021 in [facility] 3E HF PCU  SDOH Interventions   Food Insecurity Interventions Intervention Not Indicated  Housing Interventions Intervention Not Indicated  Transportation Interventions Intervention Not Indicated        [name], BSW, [facility]  Managed [facility] Social Worker [phone]

## 2022-09-17 ENCOUNTER — Emergency Department (HOSPITAL_COMMUNITY)
Admission: EM | Admit: 2022-09-17 | Discharge: 2022-09-17 | Disposition: A | Discharge status: Home / Self Care | Payer: Medicaid Other | Attending: Emergency Medicine | Admitting: Emergency Medicine

## 2022-09-17 ENCOUNTER — Encounter (HOSPITAL_COMMUNITY): Payer: Self-pay

## 2022-09-17 ENCOUNTER — Other Ambulatory Visit: Payer: Self-pay

## 2022-09-17 DIAGNOSIS — M79605 Pain in left leg: Secondary | ICD-10-CM | POA: Diagnosis present

## 2022-09-17 LAB — CBC WITH DIFFERENTIAL/PLATELET
Abs Immature Granulocytes: 0.04 10*3/uL (ref 0.00–0.07)
Basophils Absolute: 0.1 10*3/uL (ref 0.0–0.1)
Basophils Relative: 1 %
Eosinophils Absolute: 0.4 10*3/uL (ref 0.0–0.5)
Eosinophils Relative: 4 %
HCT: 45.3 % (ref 39.0–52.0)
Hemoglobin: 14.3 g/dL (ref 13.0–17.0)
Immature Granulocytes: 1 %
Lymphocytes Relative: 25 %
Lymphs Abs: 2 10*3/uL (ref 0.7–4.0)
MCH: 28.6 pg (ref 26.0–34.0)
MCHC: 31.6 g/dL (ref 30.0–36.0)
MCV: 90.6 fL (ref 80.0–100.0)
Monocytes Absolute: 1 10*3/uL (ref 0.1–1.0)
Monocytes Relative: 12 %
Neutro Abs: 4.6 10*3/uL (ref 1.7–7.7)
Neutrophils Relative %: 57 %
Platelets: 244 10*3/uL (ref 150–400)
RBC: 5 MIL/uL (ref 4.22–5.81)
RDW: 14.6 % (ref 11.5–15.5)
WBC: 8.1 10*3/uL (ref 4.0–10.5)
nRBC: 0 % (ref 0.0–0.2)

## 2022-09-17 LAB — BASIC METABOLIC PANEL
Anion gap: 10 (ref 5–15)
BUN: 21 mg/dL — ABNORMAL HIGH (ref 6–20)
CO2: 22 mmol/L (ref 22–32)
Calcium: 9 mg/dL (ref 8.9–10.3)
Chloride: 106 mmol/L (ref 98–111)
Creatinine, Ser: 1.19 mg/dL (ref 0.61–1.24)
GFR, Estimated: >60 mL/min (ref >60)
Glucose, Bld: 116 mg/dL — ABNORMAL HIGH (ref 70–99)
Potassium: 3.9 mmol/L (ref 3.5–5.1)
Sodium: 138 mmol/L (ref 135–145)

## 2022-09-17 MED ORDER — ACETAMINOPHEN 325 MG PO TABS
650.0000 mg | ORAL_TABLET | Freq: Once | ORAL | Status: AC
  Administered 2022-09-17: 650 mg via ORAL
  Filled 2022-09-17: qty 2

## 2022-09-17 MED ORDER — IBUPROFEN 800 MG PO TABS
800.0000 mg | ORAL_TABLET | Freq: Once | ORAL | Status: AC
  Administered 2022-09-17: 800 mg via ORAL
  Filled 2022-09-17: qty 1

## 2022-09-17 NOTE — ED Provider Notes (Signed)
[place] EMERGENCY DEPARTMENT AT [facility] Provider Note   CSN: [id number] Arrival date & time: 09/17/22  3086     History  Chief Complaint  Patient presents with   Leg Pain    Logan Nelson is a 57 y.o. male.  HPI 57 yo male history of cervical epidural abscess and coccygeal osteomyelitis during admission 5/23 patient presents complaining of some weakness in his left leg.  This has been ongoing but had improved at 1 point in time.  He states it is now back for the past 2 days.  He states he is having hard time getting in and out of the car.  Movement makes pain worse and he thinks this may be limiting his movement.  He has been seen and evaluated over the past several days with MRIs of his cervical, thoracic, and lumbar spine Cervical spine MRI 09/15/2022 showed no acute active discitis osteomyelitis identified C4-C5 improved spinal canal patency possible small volume residual ventral epidural fluid at C2-C3 level no evidence of moderate or severe cervical stenosis Thoracic/lumbar MRI on 09/15/2022 no convincing evidence of discitis osteomyelitis in the spine with trace bilateral.  Fest subtle soft tissue edema at L4-L5  DDX summary 09/17/22 ecommendations for Outpatient Follow-up:  SNF for short-term rehab Continue IV Ancef until 6/28 to complete 6-week course, will need PICC line removed after completion of antibiotic course CHF TOC clinic on 6/6     Discharge Diagnoses:  Principal Problem:   MSSA bacteremia C-spine epidural abscess Anasarca Hypoalbuminemia   Acute systolic (congestive) heart failure (HCC)   Prolonged QT interval   Protein-calorie malnutrition, severe   Generalized weakness   Pressure injury of buttock, stage 2 (HCC)   Hypertension   Anemia of chronic disease   Epidural abscess   Abscess in epidural space of cervical spine      Home Medications Prior to Admission medications   Medication Sig Start Date End Date Taking? Authorizing  Provider  fexofenadine (ALLEGRA) 180 MG tablet Take 1 tablet (180 mg total) by mouth daily. Patient not taking: Reported on 09/15/2022 05/23/22   Martina Sinner, MD  fluticasone [facility]) 50 MCG/ACT nasal spray Place 1 spray into both nostrils daily. Patient not taking: Reported on 09/15/2022 05/23/22   Martina Sinner, MD  fluticasone-salmeterol (ADVAIR HFA) [phone] MCG/ACT inhaler Inhale 2 puffs into the lungs 2 (two) times daily. Patient not taking: Reported on 09/15/2022 05/23/22   Martina Sinner, MD  losartan (COZAAR) 50 MG tablet Take 1 tablet (50 mg total) by mouth daily. Patient not taking: Reported on 09/15/2022 05/04/22   [name], MD  metoprolol succinate (TOPROL-XL) 100 MG 24 hr tablet Take 1 tablet (100 mg total) by mouth daily. Take with or immediately following a meal. Patient not taking: Reported on 09/15/2022 04/12/22   [name], MD  sertraline (ZOLOFT) 25 MG tablet Take 1 tablet (25 mg total) by mouth every morning. Patient not taking: Reported on 09/15/2022 04/12/22   [name], MD  spironolactone (ALDACTONE) 25 MG tablet Take 1 tablet (25 mg total) by mouth daily. Patient not taking: Reported on 09/15/2022 04/12/22   [name], MD      Allergies    Patient has no known allergies.    Review of Systems   Review of Systems  Physical Exam Updated Vital Signs BP (!) 151/99   Pulse 81   Temp 97.9 F (36.6 C) (Oral)   Resp 18   Ht 1.689 m (5' 6.5")  SpO2 98%   BMI 38.80 kg/m  Physical Exam Vitals reviewed.  Constitutional:      Appearance: Normal appearance.  HENT:     Head: Normocephalic.     Right Ear: External ear normal.     Left Ear: External ear normal.     Nose: Nose normal.     Mouth/Throat:     Pharynx: Oropharynx is clear.  Eyes:     Pupils: Pupils are equal, round, and reactive to light.  Cardiovascular:     Rate and Rhythm: Normal rate and regular rhythm.     Pulses: Normal pulses.  Pulmonary:     Effort: Pulmonary effort is normal.   Abdominal:     Palpations: Abdomen is soft.  Musculoskeletal:        General: Normal range of motion.     Cervical back: Normal range of motion.     Comments: Back and neck examined without external signs of trauma, swelling, redness, warmth, or point tenderness  Skin:    General: Skin is warm and dry.  Neurological:     General: No focal deficit present.     Mental Status: Mental status is at baseline.     Comments: Patient has no drift He is able to lift and hold his right leg off the bed for count of 10 He is able to lift the left leg off the bed for count of 10 although was not able to lift as far this may be limited to pain He has some decreased strength on plantarflexion of his left great toe with equal bilateral dorsiflexion of his great toes There is no sensory loss Perineal sensation intact  Psychiatric:        Mood and Affect: Mood normal.     ED Results / Procedures / Treatments   Labs (all labs ordered are listed, but only abnormal results are displayed) Labs Reviewed  BASIC METABOLIC PANEL - Abnormal; Notable for the following components:      Result Value   Glucose, Bld 116 (*)    BUN 21 (*)    All other components within normal limits  CBC WITH DIFFERENTIAL/PLATELET    EKG None  Radiology MR LUMBAR SPINE WO CONTRAST  Result Date: 09/15/2022 CLINICAL DATA:  Lower back pain, lower extremity weakness. History of epidural abscess. EXAM: MRI LUMBAR SPINE WITHOUT CONTRAST TECHNIQUE: Multiplanar, multisequence MR imaging of the lumbar spine was performed. No intravenous contrast was administered. COMPARISON:  Lumbar spine MRI 06/18/2021 FINDINGS: Segmentation: Standard; the lowest formed disc space is designated L5-S1. Alignment:  Normal. Vertebrae: Vertebral body heights are preserved. Background marrow signal is normal. There is multilevel degenerative endplate signal abnormality with mild edema at T11-T12, L1-L2, L4-L5, and L5-S1, similar to the prior study from  2023. There is no other marrow edema. T2/STIR hyperintensity along the posterior L5 endplate there is favored to reflect engorged venous plexus. No endplate irregularity or destruction to suggest discitis/osteomyelitis. There is no evidence of septic arthritis. Conus medullaris and cauda equina: Conus extends to the T12-L1 level. Conus and cauda equina appear normal. Paraspinal and other soft tissues: There is trace perifacetal soft tissue edema bilaterally at L4-L5 without associated osseous erosion/destruction. The paraspinal soft tissues are otherwise unremarkable. Disc levels: T12-L1: No significant spinal canal or neural foraminal stenosis L1-L2: There is a mild disc bulge without significant spinal canal or neural foraminal stenosis L2-L3: There is a mild disc bulge and mild facet arthropathy resulting in mild bilateral neural foraminal stenosis without significant  spinal canal stenosis, unchanged. L3-L4: There is a minimal disc bulge and mild facet arthropathy without significant spinal canal or neural foraminal stenosis, unchanged. L4-L5: There is a diffuse disc bulge, endplate spurring, and moderate left and mild right facet arthropathy with ligamentum flavum thickening resulting in mild spinal canal and bilateral subarticular zone narrowing, and moderate bilateral neural foraminal stenosis, unchanged. L5-S1: There is a diffuse disc bulge, endplate spurring, and mild bilateral facet arthropathy resulting in mild bilateral subarticular zone narrowing and severe right and moderate left neural foraminal stenosis, unchanged. IMPRESSION: 1. No convincing evidence of discitis/osteomyelitis in the spine. T2 hyperintensity along the posterior L5 endplate is favored to reflect engorged venous plexus has a posterior epidural abscess. 2. Multilevel degenerative changes as above is overall not significantly changed since 2023, resulting in up to mild spinal canal stenosis at L4-L5 and severe right and moderate left  neural foraminal stenosis at L5-S1. 3. Trace bilateral perifacetal soft tissue edema at L4-L5 without associated osseous erosion or destruction, favored degenerative in nature. Electronically Signed   By: Lesia Hausen M.D.   On: 09/15/2022 09:29   MR CERVICAL SPINE WO CONTRAST  Result Date: 09/15/2022 CLINICAL DATA:  57 year old male with weakness. 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 Postoperative changes since that time. EXAM: MRI CERVICAL SPINE WITHOUT CONTRAST TECHNIQUE: Multiplanar, multisequence MR imaging of the cervical spine was performed. No intravenous contrast was administered. COMPARISON:  Intraoperative radiographs 06/23/2021. Preoperative cervical spine MRI 06/20/2021 and earlier. FINDINGS: Study is intermittently degraded by motion artifact despite repeated imaging attempts. Alignment: Mild residual reversal of cervical lordosis. No significant spondylolisthesis. Vertebrae: C4-C5 ACDF hardware artifact is new from the previous MRI. Underlying heterogeneous but generally maintained T1 marrow signal throughout the cervical spine. No evidence of marrow edema or acute osseous abnormality on sagittal stir. Cord: Substantially limited evaluation due to persistent motion artifact on sagittal and axial T2 weighted imaging. But patency of the mid cervical spinal canal at the C4 level appears improved postoperatively. Posterior Fossa, vertebral arteries, paraspinal tissues: Cervicomedullary junction is within normal limits. Chronic probable lacunar infarct of the pons on series 3, image 7 is stable. Otherwise negative visible posterior fossa. Limited neck soft tissue evaluation due to persistent motion artifact. Disc levels: Limited due to motion. C2-C3 level central T2 hyperintensity on series 7, image 7 could be a small new disc protrusion or small volume residual epidural fluid. The C3-C4 level appears grossly negative now. C4-C5 ACDF is new.  No convincing  spinal stenosis at that level. Disc and endplate degeneration at C5-C6 and C6-C7 with no definite spinal stenosis. C7-T1 and the visible upper thoracic levels appear negative. IMPRESSION: 1. Motion degraded exam despite repeated imaging attempts. Especially limited spinal cord and spinal canal detail. 2. No active discitis osteomyelitis identified. C4-C5 ACDF with improved spinal canal patency there. 3. But possible small volume residual ventral epidural fluid at the C2-C3 level, or alternatively a small disc protrusion. But no evidence of moderate or severe cervical spinal stenosis. Electronically Signed   By: Odessa Fleming M.D.   On: 09/15/2022 09:19    Procedures Procedures    Medications Ordered in ED Medications  acetaminophen (TYLENOL) tablet 650 mg (has no administration in time range)  ibuprofen (ADVIL) tablet 800 mg (has no administration in time range)    ED Course/ Medical Decision Making/ A&P Clinical Course as of 09/17/22 0858  Sat Sep 17, 2022  0850 [facility] reviewed interpreted and within normal limits Basic metabolic  panel reviewed interpreted within normal limits [DR]    Clinical Course User Index [DR] Margarita Grizzle, MD                                 Medical Decision Making Amount and/or Complexity of Data Reviewed Labs: ordered.    57 year old male history of epidural abscess and sacral osteo presents today stating that he is having some increased pain and weakness of his left leg.  On exam this appears stable from prior.  He has no overt signs of infection including no tenderness, redness, fever, or leukocytosis. Patient has been seen and evaluated within the past 2 days with MRI of cervical, thoracic or lumbar spine without any evidence of overt abnormalities although some residual fluid in the lower cervical spine. I have discussed these findings with the patient.  He does have some social issues and is currently trying to leave a friend's house and find a hotel to stay  in. He has not been able to take his medication I suspect there is a significant component of financial stress contributing to patient's ongoing seeking of medical care.       Final Clinical Impression(s) / ED Diagnoses Final diagnoses:  Left leg pain    Rx / DC Orders ED Discharge Orders     None         Margarita Grizzle, MD 09/17/22 [phone]

## 2022-09-17 NOTE — ED Notes (Signed)
Pt dressed himself. Verbalized understanding of discharge instructions. Pt wheeled from ed.

## 2022-09-17 NOTE — ED Triage Notes (Signed)
Pt came in via POV d/t pain in both of his legs d/t a past infection in his spine (per pt) making hard for him to walk. States after being seen 2 days ago the pain went away for one day & now its back. Rates his pain 10/10, A/Ox4.

## 2022-09-17 NOTE — ED Notes (Signed)
Pt willing to take pants and shoes and socks off

## 2022-09-17 NOTE — Discharge Instructions (Signed)
Please get medications filled and take as prescribed. Follow up with your doctor next week. Return if worsening symptoms such as fever, worsening weakness

## 2022-09-19 ENCOUNTER — Emergency Department (HOSPITAL_COMMUNITY)
Admission: EM | Admit: 2022-09-19 | Discharge: 2022-09-19 | Discharge status: Against Medical Advice | Payer: Medicaid Other | Source: Home / Self Care

## 2022-09-19 ENCOUNTER — Other Ambulatory Visit: Payer: Medicaid Other

## 2022-09-19 NOTE — Patient Instructions (Signed)
Visit Information  Mr. Rosenbauer was given information about Medicaid Managed Care team care coordination services as a part of their [facility] Community Plan Medicaid benefit. Charlene Amil Stallone verbally consented to engagement with the [facility] Managed Care team.   If you are experiencing a medical emergency, please call 911 or report to your local emergency department or urgent care.   If you have a non-emergency medical problem during routine business hours, please contact your provider's office and ask to speak with a nurse.   For questions related to your [facility], please call: [phone] or visit the homepage here: [email or web address]  If you would like to schedule transportation through your [facility], please call the following number at least 2 days in advance of your appointment: [phone]   Rides for urgent appointments can also be made after hours by calling Member Services.  Call the Behavioral Health Crisis Line at [phone], at any time, 24 hours a day, 7 days a week. If you are in danger or need immediate medical attention call 911.  If you would like help to quit smoking, call 1-800-QUIT-NOW (562 164 8816) OR Espaol: 1-855-Djelo-Ya (7-425-956-3875) o para ms informacin haga clic aqu or Text READY to 643-329 to register via text  Mr. Flexer - following are the goals we discussed in your visit today:   Goals Addressed   None       Social Worker will follow up in 10 days.   [name], [name], MHA [facility]  Managed Medicaid Social Worker [phone]   Following is a copy of your plan of care:  There are no care plans that you recently modified to display for this patient.

## 2022-09-19 NOTE — Transitions of Care (Post Inpatient/ED Visit) (Signed)
   09/19/2022  Name: Logan Nelson MRN: [id number] DOB: 02-22-1965  Today's TOC FU Call Status: Today's TOC FU Call Status:: Successful TOC FU Call Completed TOC FU Call Complete Date: 09/19/22  Transition Care Management Follow-up Telephone Call Date of Discharge: 09/17/22 [facility] Type of Discharge: Emergency Department Reason for ED Visit:  (Leg Pain) How have you been since you were released from the hospital?: Better Any questions or concerns?: No  Items Reviewed: Did you receive and understand the discharge instructions provided?: Yes Medications obtained,verified, and reconciled?: No (No medications given) Any new allergies since your discharge?: No Dietary orders reviewed?: NA Do you have support at home?: No  Medications Reviewed Today: Medications Reviewed Today   Medications were not reviewed in this encounter     Home Care and Equipment/Supplies:    Functional Questionnaire: Do you need assistance with bathing/showering or dressing?: No Do you need assistance with meal preparation?: No Do you need assistance with eating?: No Do you have difficulty maintaining continence: No Do you need assistance with getting out of bed/getting out of a chair/moving?: No Do you have difficulty managing or taking your medications?: No  Follow up appointments reviewed: PCP Follow-up appointment confirmed?: No MD Provider Line Number:[phone] Given: No Specialist Hospital Follow-up appointment confirmed?: NA Do you need transportation to your follow-up appointment?: No Do you understand care options if your condition(s) worsen?: Yes-patient verbalized understanding    [name], [name], [facility]  Managed [facility] Social Worker [phone]

## 2022-09-19 NOTE — Patient Outreach (Signed)
Medicaid Managed Care Social Work Note  09/19/2022 Name:  Logan Nelson MRN:  [id number] DOB:  [birth year]  Logan Nelson is an 57 y.o. year old male who is a primary patient of Logan Skeans, MD.  The [facility] Managed Care Coordination team was consulted for assistance with:   housing   Logan Nelson was given information about Medicaid Managed Care Coordination team services today. Logan Nelson Patient agreed to services and verbal consent obtained.  Engaged with patient  for by telephone forfollow up visit in response to referral for case management and/or care coordination services.   Assessments/Interventions:  Review of past medical history, allergies, medications, health status, including review of consultants reports, laboratory and other test data, was performed as part of comprehensive evaluation and provision of chronic care management services.  SDOH: (Social Determinant of Health) assessments and interventions performed: SDOH Interventions    Flowsheet Row ED to Hosp-Admission (Discharged) from 06/16/2021 in [facility] 3E HF PCU  SDOH Interventions   Food Insecurity Interventions Intervention Not Indicated  Housing Interventions Intervention Not Indicated  Transportation Interventions Intervention Not Indicated     BSW completed a telephone outreach with patient, he states he did go back to the ED over the weekend. BSW spoke with patient about SNF, he states he does not want to go into a SNF because they take all of his money and he will not have enough money to pay his and his daughters phone bill. Patient states he is currently homeless and sometimes stays with a friend, he states he has tried the affordable housing options and they all have a waitlist he has also tried shelters and they are full.  Advanced Directives Status:  Not addressed in this encounter.  Care Plan                 No Known Allergies  Medications Reviewed Today    Medications were not reviewed in this encounter     Patient Active Problem List   Diagnosis Date Noted   Abscess in epidural space of cervical spine 06/23/2021   Epidural abscess    Acute systolic (congestive) heart failure (HCC) 06/17/2021   Prolonged QT interval 06/17/2021   MSSA bacteremia    Pressure ulcer of sacral region, stage 3 (HCC)    Acute heart failure with preserved ejection fraction (HFpEF) (HCC) 06/13/2021   Anasarca    Pressure injury of sacral region, unstageable (HCC) 05/25/2021   Pressure injury of buttock, stage 2 (HCC) 05/14/2021   Hypomagnesemia 05/13/2021   Vitamin D deficiency 05/13/2021   Generalized weakness 05/12/2021   Hypophosphatemia 05/12/2021   Lower extremity weakness 05/11/2021   Hypokalemia 05/11/2021   Anemia of chronic disease 05/11/2021   GERD (gastroesophageal reflux disease) 05/11/2021   Bilateral inguinal hernia 04/23/2021   Protein-calorie malnutrition, severe 04/14/2021   Protein calorie malnutrition (HCC) 04/13/2021   Protein-calorie malnutrition (HCC) 04/12/2021   Hepatic steatosis 04/12/2021   Stroke (HCC) 04/11/2021   Small bowel obstruction (HCC) 10/13/2020   Anemia due to vitamin B12 deficiency 10/13/2020   Hypertension 01/09/2015   Incisional hernia 01/07/2015    Conditions to be addressed/monitored per PCP order:   housing  There are no care plans that you recently modified to display for this patient.   Follow up:  Patient agrees to Care Plan and Follow-up.  Plan: The Managed Medicaid care management team will reach out to the patient again over the next 10 days.  Date/time of next scheduled Social  Work care management/care coordination outreach:  10/03/22  Logan Nelson, Logan Nelson, [facility]  Managed [facility] Social Worker [phone]

## 2022-10-03 ENCOUNTER — Other Ambulatory Visit: Payer: Medicaid Other

## 2022-10-03 NOTE — Patient Instructions (Signed)
Visit Information  [name] was given information about Medicaid Managed Care team care coordination services as a part of their [facility] Community Plan Medicaid benefit. Logan Nelson verbally consented to engagement with the [facility] Managed Care team.   If you are experiencing a medical emergency, please call 911 or report to your local emergency department or urgent care.   If you have a non-emergency medical problem during routine business hours, please contact your provider's office and ask to speak with a nurse.   For questions related to your [facility], please call: [phone] or visit the homepage here: [email or web address]  If you would like to schedule transportation through your [facility], please call the following number at least 2 days in advance of your appointment: [phone]   Rides for urgent appointments can also be made after hours by calling Member Services.  Call the [facility] Crisis Line at [phone], at any time, 24 hours a day, 7 days a week. If you are in danger or need immediate medical attention call 911.  If you would like help to quit smoking, call 1-800-QUIT-NOW (518-100-7718) OR Espaol: 1-855-Djelo-Ya (7-253-664-4034) o para ms informacin haga clic aqu or Text READY to 742-595 to register via text  [name] - following are the goals we discussed in your visit today:   Goals Addressed   None     Social Worker will follow up on 11/03/22.   Logan Nelson, Logan Nelson, Logan Nelson Logan Nelson  Managed Medicaid Social Worker [phone]   Following is a copy of your plan of care:  There are no care plans that you recently modified to display for this patient.

## 2022-10-03 NOTE — Patient Outreach (Signed)
  Medicaid Managed Care Social Work Note  10/03/2022 Name:  Logan Nelson MRN:  [id number] DOB:  02-Jul-1965  Logan Nelson is an 57 y.o. year old male who is a primary patient of Logan Skeans, MD.  The [facility] Managed Care Coordination team was consulted for assistance with:   housing   Logan Nelson was given information about Medicaid Managed Care Coordination team services today. Logan Nelson Patient agreed to services and verbal consent obtained.  Engaged with patient  for by telephone forfollow up visit in response to referral for case management and/or care coordination services.   Assessments/Interventions:  Review of past medical history, allergies, medications, health status, including review of consultants reports, laboratory and other test data, was performed as part of comprehensive evaluation and provision of chronic care management services.  SDOH: (Social Determinant of Health) assessments and interventions performed: SDOH Interventions    Flowsheet Row ED to Hosp-Admission (Discharged) from 06/16/2021 in [facility] 3E HF PCU  SDOH Interventions   Food Insecurity Interventions Intervention Not Indicated  Housing Interventions Intervention Not Indicated  Transportation Interventions Intervention Not Indicated     BSW completed a telephone outreach with patient, he states he still wants to go into a facility. He is living in his car, he was in the [employer] parking lot. Patient reports he is still in pain. BSW will continue to follow patient.  Advanced Directives Status:  Not addressed in this encounter.  Care Plan                 No Known Allergies  Medications Reviewed Today   Medications were not reviewed in this encounter     Patient Active Problem List   Diagnosis Date Noted   Abscess in epidural space of cervical spine 06/23/2021   Epidural abscess    Acute systolic (congestive) heart failure (HCC) 06/17/2021   Prolonged QT  interval 06/17/2021   MSSA bacteremia    Pressure ulcer of sacral region, stage 3 (HCC)    Acute heart failure with preserved ejection fraction (HFpEF) (HCC) 06/13/2021   Anasarca    Pressure injury of sacral region, unstageable (HCC) 05/25/2021   Pressure injury of buttock, stage 2 (HCC) 05/14/2021   Hypomagnesemia 05/13/2021   Vitamin D deficiency 05/13/2021   Generalized weakness 05/12/2021   Hypophosphatemia 05/12/2021   Lower extremity weakness 05/11/2021   Hypokalemia 05/11/2021   Anemia of chronic disease 05/11/2021   GERD (gastroesophageal reflux disease) 05/11/2021   Bilateral inguinal hernia 04/23/2021   Protein-calorie malnutrition, severe 04/14/2021   Protein calorie malnutrition (HCC) 04/13/2021   Protein-calorie malnutrition (HCC) 04/12/2021   Hepatic steatosis 04/12/2021   Stroke (HCC) 04/11/2021   Small bowel obstruction (HCC) 10/13/2020   Anemia due to vitamin B12 deficiency 10/13/2020   Hypertension 01/09/2015   Incisional hernia 01/07/2015    Conditions to be addressed/monitored per PCP order:   housing  There are no care plans that you recently modified to display for this patient.   Follow up:  Patient agrees to Care Plan and Follow-up.  Plan: The Managed Medicaid care management team will reach out to the patient again over the next 30 days.  Date/time of next scheduled Social Work care management/care coordination outreach:  11/03/22  [name], [name], [facility]  Managed [facility] Social Worker [phone]

## 2022-10-07 ENCOUNTER — Telehealth: Payer: Self-pay | Admitting: Family Medicine

## 2022-10-07 NOTE — Telephone Encounter (Signed)
Copied from CRM [id number]. Topic: General - Call Back - No Documentation >> Oct 05, 2022  9:36 AM Everette C wrote: Reason for CRM: Please contact further when possible

## 2022-10-11 ENCOUNTER — Telehealth: Payer: Self-pay | Admitting: Family Medicine

## 2022-10-11 NOTE — Telephone Encounter (Signed)
Called patient to set an appointment up with PCP per patients request,

## 2022-10-11 NOTE — Telephone Encounter (Signed)
Patient said he spoke with caseworker who said his PCP would contact him. Patient stated he has not had medication in a while but has gone back to the ED. He wants his PCP to call him back.

## 2022-10-12 NOTE — Telephone Encounter (Signed)
Patient called and given appt

## 2022-10-13 ENCOUNTER — Emergency Department (HOSPITAL_COMMUNITY)
Admission: EM | Admit: 2022-10-13 | Discharge: 2022-10-13 | Disposition: A | Discharge status: Home / Self Care | Payer: Medicaid Other | Attending: Emergency Medicine | Admitting: Emergency Medicine

## 2022-10-13 ENCOUNTER — Emergency Department (HOSPITAL_COMMUNITY): Payer: Medicaid Other

## 2022-10-13 ENCOUNTER — Other Ambulatory Visit (HOSPITAL_COMMUNITY): Payer: Self-pay

## 2022-10-13 ENCOUNTER — Other Ambulatory Visit: Payer: Self-pay

## 2022-10-13 DIAGNOSIS — I509 Heart failure, unspecified: Secondary | ICD-10-CM | POA: Diagnosis not present

## 2022-10-13 DIAGNOSIS — R059 Cough, unspecified: Secondary | ICD-10-CM | POA: Diagnosis present

## 2022-10-13 DIAGNOSIS — I110 Hypertensive heart disease with heart failure: Secondary | ICD-10-CM | POA: Insufficient documentation

## 2022-10-13 DIAGNOSIS — J101 Influenza due to other identified influenza virus with other respiratory manifestations: Secondary | ICD-10-CM | POA: Diagnosis not present

## 2022-10-13 DIAGNOSIS — Z79899 Other long term (current) drug therapy: Secondary | ICD-10-CM | POA: Diagnosis not present

## 2022-10-13 DIAGNOSIS — J189 Pneumonia, unspecified organism: Secondary | ICD-10-CM

## 2022-10-13 MED ORDER — BENZONATATE 100 MG PO CAPS
200.0000 mg | ORAL_CAPSULE | Freq: Once | ORAL | Status: AC
  Administered 2022-10-13: 200 mg via ORAL
  Filled 2022-10-13: qty 2

## 2022-10-13 MED ORDER — DOXYCYCLINE HYCLATE 100 MG PO TABS
100.0000 mg | ORAL_TABLET | Freq: Once | ORAL | Status: AC
  Administered 2022-10-13: 100 mg via ORAL
  Filled 2022-10-13: qty 1

## 2022-10-13 MED ORDER — DOXYCYCLINE HYCLATE 100 MG PO TABS
100.0000 mg | ORAL_TABLET | Freq: Two times a day (BID) | ORAL | 0 refills | Status: DC
  Filled 2022-10-13 – 2022-10-28 (×3): qty 20, 10d supply, fill #0

## 2022-10-13 NOTE — ED Provider Notes (Signed)
[facility] AT [facility] Provider Note   CSN: [id number] Arrival date & time: 10/13/22  0031     History  Chief Complaint  Patient presents with   Cough    Logan Nelson is a 57 y.o. male.  57 year old male with past medical history of hypertension, GERD, CHF, recently admitted to the hospital with epidural abscess and cervical spine.  Patient presents with complaint of cough x 2 weeks which is not worsening but not improving.  Cough is occasionally productive with white sputum.  Reports mild associated shortness of breath, denies fevers.  Reports his lower extremity edema to be at baseline.  States that he is trying to move but does not have anywhere to moved to.  He is out of funds to buy his medications and states he is not taking his regular medications for the past 2 months.  He is scheduled to see his PCP on September 18.  No known sick contacts.  No other complaints or concerns.       Home Medications Prior to Admission medications   Medication Sig Start Date End Date Taking? Authorizing Provider  doxycycline (VIBRAMYCIN) 100 MG capsule Take 1 capsule (100 mg total) by mouth 2 (two) times daily. 10/13/22  Yes Jeannie Fend, PA-C  fexofenadine (ALLEGRA) 180 MG tablet Take 1 tablet (180 mg total) by mouth daily. Patient not taking: Reported on 09/15/2022 05/23/22   Martina Sinner, MD  fluticasone [facility]) 50 MCG/ACT nasal spray Place 1 spray into both nostrils daily. Patient not taking: Reported on 09/15/2022 05/23/22   Martina Sinner, MD  fluticasone-salmeterol (ADVAIR HFA) [phone] MCG/ACT inhaler Inhale 2 puffs into the lungs 2 (two) times daily. Patient not taking: Reported on 09/15/2022 05/23/22   Martina Sinner, MD  losartan (COZAAR) 50 MG tablet Take 1 tablet (50 mg total) by mouth daily. Patient not taking: Reported on 09/15/2022 05/04/22   [name], MD  metoprolol succinate (TOPROL-XL) 100 MG 24 hr tablet Take 1 tablet (100  mg total) by mouth daily. Take with or immediately following a meal. Patient not taking: Reported on 09/15/2022 04/12/22   [name], MD  sertraline (ZOLOFT) 25 MG tablet Take 1 tablet (25 mg total) by mouth every morning. Patient not taking: Reported on 09/15/2022 04/12/22   [name], MD  spironolactone (ALDACTONE) 25 MG tablet Take 1 tablet (25 mg total) by mouth daily. Patient not taking: Reported on 09/15/2022 04/12/22   [name], MD      Allergies    Patient has no known allergies.    Review of Systems   Review of Systems Negative except as per HPI Physical Exam Updated Vital Signs BP (!) 125/96   Pulse 78   Temp 98.3 F (36.8 C) (Oral)   Resp 16   SpO2 97%  Physical Exam Vitals and nursing note reviewed.  Constitutional:      General: He is not in acute distress.    Appearance: He is well-developed. He is not diaphoretic.  HENT:     Head: Normocephalic and atraumatic.  Cardiovascular:     Rate and Rhythm: Normal rate and regular rhythm.     Heart sounds: Normal heart sounds.  Pulmonary:     Effort: Pulmonary effort is normal.     Breath sounds: Normal breath sounds.  Abdominal:     Palpations: Abdomen is soft.     Tenderness: There is no abdominal tenderness.  Musculoskeletal:     Right lower  leg: Edema present.     Left lower leg: Edema present.     Comments: Mild nonpitting lower extremity edema bilaterally.  Calves are soft and nontender.  Skin:    General: Skin is warm and dry.  Neurological:     Mental Status: He is alert and oriented to person, place, and time.  Psychiatric:        Behavior: Behavior normal.     ED Results / Procedures / Treatments   Labs (all labs ordered are listed, but only abnormal results are displayed) Labs Reviewed - No data to display  EKG None  Radiology DG Chest 2 View  Result Date: 10/13/2022 CLINICAL DATA:  Cough and congestion EXAM: CHEST - 2 VIEW COMPARISON:  Radiograph and CT 06/28/2021 FINDINGS: Stable  cardiomegaly. Aortic atherosclerotic calcification. Mid and lower lung predominant airspace and interstitial opacities. These are improved compared to 06/28/2021. No pleural effusion or pneumothorax. IMPRESSION: Findings suggestive of CHF with pulmonary edema. Atypical infection is felt less likely. Electronically Signed   By: Minerva Fester M.D.   On: 10/13/2022 00:55    Procedures Procedures    Medications Ordered in ED Medications  doxycycline (VIBRA-TABS) tablet 100 mg (100 mg Oral Given 10/13/22 0134)  benzonatate (TESSALON) capsule 200 mg (200 mg Oral Given 10/13/22 0134)    ED Course/ Medical Decision Making/ A&P                                 Medical Decision Making Amount and/or Complexity of Data Reviewed Radiology: ordered.   This patient presents to the ED for concern of cough, this involves an extensive number of treatment options, and is a complaint that carries with it a high risk of complications and morbidity.  The differential diagnosis includes but not limited to viral URI, PNA, CHF, asthma/COPD exacerabation, GERD   Co morbidities that complicate the patient evaluation  As reviewed above and additional history reviewed on file   Additional history obtained:  External records from outside source obtained and reviewed including history on file reviewed. Prior echo on file dated 06/15/21 with EF 30-35%  Imaging Studies ordered:  I ordered imaging studies including CXR  I independently visualized and interpreted imaging which showed CHF vs PNA I agree with the radiologist interpretation   Consultations Obtained:  I requested consultation with the ER attending Dr. Pilar Plate,  and discussed lab and imaging findings as well as pertinent plan - they recommend: abx for likely atypical infection   Problem List / ED Course / Critical interventions / Medication management  57 year old male with cough x 2 weeks, here tonight because he is sick of coughing. Exam is  reassuring, non toxic, in no distress. CXR with possible CHF vs PNA. Does not appear volume overloaded. Will cover with abx, referral to [facility] to consider medication assistance. Recommend recheck with PCP, return to ER as needed.  I ordered medication including tessalon, doxy  for cough, PNA  Reevaluation of the patient after these medicines showed that the patient stayed the same I have reviewed the patients home medicines and have made adjustments as needed   Social Determinants of Health:  Reports lack of money to pay for medications, provided with referral to [facility], patient has medicaid    Test / Admission - Considered:  Consider labs for further work up however, vitals largely reassuring, O2 sat 98% on room air, lungs are clear in all fields,  afebrile. Will tx with abx.          Final Clinical Impression(s) / ED Diagnoses Final diagnoses:  Community acquired pneumonia, unspecified laterality    Rx / DC Orders ED Discharge Orders          Ordered    doxycycline (VIBRAMYCIN) 100 MG capsule  2 times daily        10/13/22 0126              Jeannie Fend, PA-C 10/13/22 0141    Sabas Sous, MD 10/13/22 [phone]

## 2022-10-13 NOTE — ED Triage Notes (Signed)
Patient reports persistent productive cough with chest congestion for several days . Respirations unlabored/denies fever or chills .

## 2022-10-13 NOTE — Telephone Encounter (Signed)
See routing comment(s) for message information.

## 2022-10-13 NOTE — Discharge Instructions (Signed)
Take antibiotics as prescribed and complete the full course. Follow up with your doctor. A referral has been made to the hospital social worker. Your prescription has been sent to the hospital pharmacy.

## 2022-10-13 NOTE — Telephone Encounter (Signed)
RNCM consulted in regards to medication assistance.  Pt has Medicaid insurance coverage and is not eligible for Medication Assistance Through [facility]) program.  No further CM needs communicated at this time.

## 2022-10-13 NOTE — ED Notes (Signed)
Pt refused to change into gown and cardiac equipment.

## 2022-10-19 ENCOUNTER — Other Ambulatory Visit: Payer: Self-pay

## 2022-10-20 DIAGNOSIS — F331 Major depressive disorder, recurrent, moderate: Secondary | ICD-10-CM | POA: Diagnosis not present

## 2022-10-21 DIAGNOSIS — F331 Major depressive disorder, recurrent, moderate: Secondary | ICD-10-CM | POA: Diagnosis not present

## 2022-10-25 DIAGNOSIS — F331 Major depressive disorder, recurrent, moderate: Secondary | ICD-10-CM | POA: Diagnosis not present

## 2022-10-26 ENCOUNTER — Ambulatory Visit (INDEPENDENT_AMBULATORY_CARE_PROVIDER_SITE_OTHER): Payer: Medicaid Other | Admitting: Family Medicine

## 2022-10-26 ENCOUNTER — Encounter: Payer: Self-pay | Admitting: Family Medicine

## 2022-10-26 ENCOUNTER — Other Ambulatory Visit: Payer: Self-pay

## 2022-10-26 VITALS — BP 150/92 | HR 56 | Temp 98.1°F | Resp 16 | Wt 243.0 lb

## 2022-10-26 DIAGNOSIS — Z6839 Body mass index (BMI) 39.0-39.9, adult: Secondary | ICD-10-CM | POA: Diagnosis not present

## 2022-10-26 DIAGNOSIS — I10 Essential (primary) hypertension: Secondary | ICD-10-CM | POA: Diagnosis not present

## 2022-10-26 DIAGNOSIS — G894 Chronic pain syndrome: Secondary | ICD-10-CM | POA: Diagnosis not present

## 2022-10-26 DIAGNOSIS — F419 Anxiety disorder, unspecified: Secondary | ICD-10-CM | POA: Diagnosis not present

## 2022-10-26 DIAGNOSIS — M5440 Lumbago with sciatica, unspecified side: Secondary | ICD-10-CM | POA: Diagnosis not present

## 2022-10-26 DIAGNOSIS — G8929 Other chronic pain: Secondary | ICD-10-CM

## 2022-10-26 DIAGNOSIS — F32A Depression, unspecified: Secondary | ICD-10-CM

## 2022-10-26 MED ORDER — METOPROLOL SUCCINATE ER 100 MG PO TB24
100.0000 mg | ORAL_TABLET | Freq: Every day | ORAL | 1 refills | Status: DC
  Filled 2022-10-26: qty 90, 90d supply, fill #0

## 2022-10-26 MED ORDER — TRIAMCINOLONE ACETONIDE 40 MG/ML IJ SUSP
40.0000 mg | Freq: Once | INTRAMUSCULAR | Status: AC
Start: 2022-10-26 — End: 2022-10-26
  Administered 2022-10-26: 40 mg via INTRAMUSCULAR

## 2022-10-26 MED ORDER — SERTRALINE HCL 25 MG PO TABS
25.0000 mg | ORAL_TABLET | Freq: Every morning | ORAL | 1 refills | Status: DC
  Filled 2022-10-26: qty 90, 90d supply, fill #0

## 2022-10-26 MED ORDER — SPIRONOLACTONE 25 MG PO TABS
25.0000 mg | ORAL_TABLET | Freq: Every day | ORAL | 1 refills | Status: DC
  Filled 2022-10-26: qty 90, 90d supply, fill #0

## 2022-10-26 MED ORDER — LOSARTAN POTASSIUM 50 MG PO TABS
50.0000 mg | ORAL_TABLET | Freq: Every day | ORAL | 0 refills | Status: DC
  Filled 2022-10-26: qty 90, 90d supply, fill #0

## 2022-10-26 MED ORDER — OXYCODONE-ACETAMINOPHEN 10-325 MG PO TABS: 1.0000 | ORAL_TABLET | ORAL | 0 refills | Status: AC | PRN

## 2022-10-27 DIAGNOSIS — F331 Major depressive disorder, recurrent, moderate: Secondary | ICD-10-CM | POA: Diagnosis not present

## 2022-10-28 ENCOUNTER — Other Ambulatory Visit: Payer: Self-pay

## 2022-10-31 ENCOUNTER — Encounter: Payer: Self-pay | Admitting: Family Medicine

## 2022-10-31 NOTE — Progress Notes (Signed)
Established Patient Office Visit  Subjective    Patient ID: Logan Nelson, male    DOB: 07-09-1965  Age: 57 y.o. MRN: [id number]  CC:  Chief Complaint  Patient presents with   Medical Management of Chronic Issues    HPI Logan Nelson presents for follow up of chronic med issues. Patient denies acute complaints.   Outpatient Encounter Medications as of 10/26/2022  Medication Sig   doxycycline (VIBRA-TABS) 100 MG tablet Take 1 tablet (100 mg total) by mouth 2 (two) times daily.   fexofenadine (ALLEGRA) 180 MG tablet Take 1 tablet (180 mg total) by mouth daily.   fluticasone (FLONASE) 50 MCG/ACT nasal spray Place 1 spray into both nostrils daily.   fluticasone-salmeterol (ADVAIR HFA) 115-21 MCG/ACT inhaler Inhale 2 puffs into the lungs 2 (two) times daily.   losartan (COZAAR) 50 MG tablet Take 1 tablet (50 mg total) by mouth daily.   metoprolol succinate (TOPROL-XL) 100 MG 24 hr tablet Take 1 tablet (100 mg total) by mouth daily. Take with or immediately following a meal.   oxyCODONE-acetaminophen (PERCOCET) 10-325 MG tablet Take 1 tablet by mouth every 4 (four) hours as needed for up to 7 days for pain.   sertraline (ZOLOFT) 25 MG tablet Take 1 tablet (25 mg total) by mouth every morning.   spironolactone (ALDACTONE) 25 MG tablet Take 1 tablet (25 mg total) by mouth daily.   [DISCONTINUED] losartan (COZAAR) 50 MG tablet Take 1 tablet (50 mg total) by mouth daily.   [DISCONTINUED] metoprolol succinate (TOPROL-XL) 100 MG 24 hr tablet Take 1 tablet (100 mg total) by mouth daily. Take with or immediately following a meal.   [DISCONTINUED] sertraline (ZOLOFT) 25 MG tablet Take 1 tablet (25 mg total) by mouth every morning.   [DISCONTINUED] spironolactone (ALDACTONE) 25 MG tablet Take 1 tablet (25 mg total) by mouth daily.   [EXPIRED] triamcinolone acetonide (KENALOG-40) injection 40 mg    No facility-administered encounter medications on file as of 10/26/2022.    Past Medical  History:  Diagnosis Date   Anemia    Arthritis    Bradycardia    Dyspnea    Falling episodes    GERD (gastroesophageal reflux disease)    Hernia of abdominal wall    Hypertension    Lower extremity edema     Past Surgical History:  Procedure Laterality Date   ABDOMINAL SURGERY     ANTERIOR CERVICAL DECOMP/DISCECTOMY FUSION N/A 06/23/2021   Procedure: Anterior cervical Decompression and epidural Abcess evacuation with arthodesis cervical Four -five;  Surgeon: Coletta Memos, MD;  Location: [facility] OR;  Service: Neurosurgery;  Laterality: N/A;   INGUINAL HERNIA REPAIR Right 04/30/2021   Procedure: HERNIA REPAIR INGUINAL WITH MESH;  Surgeon: Stechschulte, Hyman Hopes, MD;  Location: [facility] OR;  Service: General;  Laterality: Right;   LYSIS OF ADHESION  04/30/2021   Procedure: LYSIS OF ADHESION;  Surgeon: Quentin Ore, MD;  Location: [facility] OR;  Service: General;;   RADIOLOGY WITH ANESTHESIA N/A 06/16/2021   Procedure: MRI CERVICAL THORACIC LUMBER AND SACRUM;  Surgeon: Radiologist, Medication, MD;  Location: [facility] OR;  Service: Radiology;  Laterality: N/A;   RADIOLOGY WITH ANESTHESIA N/A 06/18/2021   Procedure: MRI WITH ANESTHESIA;  Surgeon: Radiologist, Medication, MD;  Location: [facility] OR;  Service: Radiology;  Laterality: N/A;   RADIOLOGY WITH ANESTHESIA N/A 06/20/2021   Procedure: MRI WITH ANESTHESIA;  Surgeon: Radiologist, Medication, MD;  Location: [facility] OR;  Service: Radiology;  Laterality: N/A;   UMBILICAL HERNIA REPAIR N/A  04/30/2021   Procedure: OPEN UMBILICAL AND EPIGASTRIC HERNIA REPAIR;  Surgeon: Quentin Ore, MD;  Location: [facility] OR;  Service: General;  Laterality: N/A;    History reviewed. No pertinent family history.  Social History   Socioeconomic History   Marital status: Single    Spouse name: Not on file   Number of children: 1   Years of education: Not on file   Highest education level: High school graduate  Occupational History    Comment: pending disability  Tobacco Use    Smoking status: Some Days    Current packs/day: 0.00    Types: Cigarettes    Last attempt to quit: 04/28/2020    Years since quitting: 2.5   Smokeless tobacco: Never  Vaping Use   Vaping status: Never Used  Substance and Sexual Activity   Alcohol use: Not Currently    Comment: before admission   Drug use: Not Currently    Types: Cocaine    Comment: past history   Sexual activity: Not Currently  Other Topics Concern   Not on file  Social History Narrative   ** Merged History Encounter **       Social Determinants of Health   Financial Resource Strain: Medium Risk (09/16/2022)   Overall Financial Resource Strain (CARDIA)    Difficulty of Paying Living Expenses: Somewhat hard  Food Insecurity: Food Insecurity Present (10/26/2022)   Hunger Vital Sign    Worried About Running Out of Food in the Last Year: Sometimes true    Ran Out of Food in the Last Year: Sometimes true  Transportation Needs: No Transportation Needs (07/02/2021)   PRAPARE - Administrator, Civil Service (Medical): No    Lack of Transportation (Non-Medical): No  Physical Activity: Not on file  Stress: Not on file  Social Connections: Not on file  Intimate Partner Violence: Not At Risk (09/16/2022)   Humiliation, Afraid, Rape, and Kick questionnaire    Fear of Current or Ex-Partner: No    Emotionally Abused: No    Physically Abused: No    Sexually Abused: No    Review of Systems  All other systems reviewed and are negative.       Objective    BP (!) 150/92   Pulse (!) 56   Temp 98.1 F (36.7 C) (Oral)   Resp 16   Wt 243 lb (110.2 kg)   SpO2 97%   BMI 38.63 kg/m   Physical Exam Vitals and nursing note reviewed.  Constitutional:      General: He is not in acute distress.    Appearance: He is obese.  Cardiovascular:     Rate and Rhythm: Regular rhythm.  Pulmonary:     Effort: Pulmonary effort is normal.     Breath sounds: Normal breath sounds.  Abdominal:     Palpations: Abdomen  is soft.     Tenderness: There is no abdominal tenderness.  Musculoskeletal:     Right lower leg: No edema.     Left lower leg: No edema.  Neurological:     General: No focal deficit present.     Mental Status: He is alert and oriented to person, place, and time.         Assessment & Plan:   Anxiety and depression  Primary hypertension -     Spironolactone; Take 1 tablet (25 mg total) by mouth daily.  Dispense: 90 tablet; Refill: 1  Chronic pain syndrome -     Ambulatory referral to  Pain Clinic  Chronic midline low back pain with sciatica, sciatica laterality unspecified -     Triamcinolone Acetonide -     Ambulatory referral to Orthopedic Surgery  Class 2 severe obesity due to excess calories with serious comorbidity and body mass index (BMI) of 39.0 to 39.9 in adult [facility])  Other orders -     Losartan Potassium; Take 1 tablet (50 mg total) by mouth daily.  Dispense: 90 tablet; Refill: 0 -     Metoprolol Succinate ER; Take 1 tablet (100 mg total) by mouth daily. Take with or immediately following a meal.  Dispense: 90 tablet; Refill: 1 -     Sertraline HCl; Take 1 tablet (25 mg total) by mouth every morning.  Dispense: 90 tablet; Refill: 1 -     oxyCODONE-Acetaminophen; Take 1 tablet by mouth every 4 (four) hours as needed for up to 7 days for pain.  Dispense: 30 tablet; Refill: 0     Return in about 1 month (around 11/25/2022) for follow up.   Tommie Raymond, MD

## 2022-11-01 ENCOUNTER — Encounter (HOSPITAL_BASED_OUTPATIENT_CLINIC_OR_DEPARTMENT_OTHER): Payer: Self-pay | Admitting: Student

## 2022-11-01 ENCOUNTER — Ambulatory Visit (INDEPENDENT_AMBULATORY_CARE_PROVIDER_SITE_OTHER): Payer: Medicaid Other | Admitting: Student

## 2022-11-01 DIAGNOSIS — F331 Major depressive disorder, recurrent, moderate: Secondary | ICD-10-CM | POA: Diagnosis not present

## 2022-11-01 DIAGNOSIS — M19011 Primary osteoarthritis, right shoulder: Secondary | ICD-10-CM | POA: Diagnosis not present

## 2022-11-01 DIAGNOSIS — M25511 Pain in right shoulder: Secondary | ICD-10-CM

## 2022-11-01 MED ORDER — LIDOCAINE HCL 1 % IJ SOLN
4.0000 mL | INTRAMUSCULAR | Status: AC | PRN
Start: 2022-11-01 — End: 2022-11-01
  Administered 2022-11-01: 4 mL

## 2022-11-01 MED ORDER — TRIAMCINOLONE ACETONIDE 40 MG/ML IJ SUSP
2.0000 mL | INTRAMUSCULAR | Status: AC | PRN
Start: 2022-11-01 — End: 2022-11-01
  Administered 2022-11-01: 2 mL via INTRA_ARTICULAR

## 2022-11-01 NOTE — Progress Notes (Signed)
Chief Complaint: Right shoulder pain     History of Present Illness:    Logan Nelson is a 57 y.o. male presenting today for evaluation of his right shoulder as well as possible repeat cortisone injection.  He does have a known history of right shoulder osteoarthritis and last received an injection on 05/20/2022.  He states that while this did not take his pain away completely, it did improve for a few months.  It has now worn off and he rates his shoulder pain as an 8/10.  He is right-hand dominant.  Also reports that his PCP recently referred him to a pain clinic but he has not been seen yet.   Surgical History:   None  PMH/PSH/Family History/Social History/Meds/Allergies:    Past Medical History:  Diagnosis Date   Anemia    Arthritis    Bradycardia    Dyspnea    Falling episodes    GERD (gastroesophageal reflux disease)    Hernia of abdominal wall    Hypertension    Lower extremity edema    Past Surgical History:  Procedure Laterality Date   ABDOMINAL SURGERY     ANTERIOR CERVICAL DECOMP/DISCECTOMY FUSION N/A 06/23/2021   Procedure: Anterior cervical Decompression and epidural Abcess evacuation with arthodesis cervical Four -five;  Surgeon: Coletta Memos, MD;  Location: [facility] OR;  Service: Neurosurgery;  Laterality: N/A;   INGUINAL HERNIA REPAIR Right 04/30/2021   Procedure: HERNIA REPAIR INGUINAL WITH MESH;  Surgeon: Stechschulte, Hyman Hopes, MD;  Location: [facility] OR;  Service: General;  Laterality: Right;   LYSIS OF ADHESION  04/30/2021   Procedure: LYSIS OF ADHESION;  Surgeon: Quentin Ore, MD;  Location: [facility] OR;  Service: General;;   RADIOLOGY WITH ANESTHESIA N/A 06/16/2021   Procedure: MRI CERVICAL THORACIC LUMBER AND SACRUM;  Surgeon: Radiologist, Medication, MD;  Location: [facility] OR;  Service: Radiology;  Laterality: N/A;   RADIOLOGY WITH ANESTHESIA N/A 06/18/2021   Procedure: MRI WITH ANESTHESIA;  Surgeon: Radiologist, Medication, MD;   Location: [facility] OR;  Service: Radiology;  Laterality: N/A;   RADIOLOGY WITH ANESTHESIA N/A 06/20/2021   Procedure: MRI WITH ANESTHESIA;  Surgeon: Radiologist, Medication, MD;  Location: [facility] OR;  Service: Radiology;  Laterality: N/A;   UMBILICAL HERNIA REPAIR N/A 04/30/2021   Procedure: OPEN UMBILICAL AND EPIGASTRIC HERNIA REPAIR;  Surgeon: Quentin Ore, MD;  Location: [facility] OR;  Service: General;  Laterality: N/A;   Social History   Socioeconomic History   Marital status: Single    Spouse name: Not on file   Number of children: 1   Years of education: Not on file   Highest education level: High school graduate  Occupational History    Comment: pending disability  Tobacco Use   Smoking status: Some Days    Current packs/day: 0.00    Types: Cigarettes    Last attempt to quit: 04/28/2020    Years since quitting: 2.5   Smokeless tobacco: Never  Vaping Use   Vaping status: Never Used  Substance and Sexual Activity   Alcohol use: Not Currently    Comment: before admission   Drug use: Not Currently    Types: Cocaine    Comment: past history   Sexual activity: Not Currently  Other Topics Concern   Not on file  Social History Narrative   **  Merged History Encounter **       Social Determinants of Health   Financial Resource Strain: Medium Risk (09/16/2022)   Overall Financial Resource Strain (CARDIA)    Difficulty of Paying Living Expenses: Somewhat hard  Food Insecurity: Food Insecurity Present (10/26/2022)   Hunger Vital Sign    Worried About Running Out of Food in the Last Year: Sometimes true    Ran Out of Food in the Last Year: Sometimes true  Transportation Needs: No Transportation Needs (07/02/2021)   PRAPARE - Administrator, Civil Service (Medical): No    Lack of Transportation (Non-Medical): No  Physical Activity: Not on file  Stress: Not on file  Social Connections: Not on file   History reviewed. No pertinent family history. No Known Allergies Current  Outpatient Medications  Medication Sig Dispense Refill   doxycycline (VIBRA-TABS) 100 MG tablet Take 1 tablet (100 mg total) by mouth 2 (two) times daily. 20 tablet 0   fexofenadine (ALLEGRA) 180 MG tablet Take 1 tablet (180 mg total) by mouth daily. 30 tablet 5   fluticasone (FLONASE) 50 MCG/ACT nasal spray Place 1 spray into both nostrils daily. 16 g 2   fluticasone-salmeterol (ADVAIR HFA) 115-21 MCG/ACT inhaler Inhale 2 puffs into the lungs 2 (two) times daily. 1 each 12   losartan (COZAAR) 50 MG tablet Take 1 tablet (50 mg total) by mouth daily. 90 tablet 0   metoprolol succinate (TOPROL-XL) 100 MG 24 hr tablet Take 1 tablet (100 mg total) by mouth daily. Take with or immediately following a meal. 90 tablet 1   oxyCODONE-acetaminophen (PERCOCET) 10-325 MG tablet Take 1 tablet by mouth every 4 (four) hours as needed for up to 7 days for pain. 30 tablet 0   sertraline (ZOLOFT) 25 MG tablet Take 1 tablet (25 mg total) by mouth every morning. 90 tablet 1   spironolactone (ALDACTONE) 25 MG tablet Take 1 tablet (25 mg total) by mouth daily. 90 tablet 1   No current facility-administered medications for this visit.   No results found.  Review of Systems:   A ROS was performed including pertinent positives and negatives as documented in the HPI.  Physical Exam :   Constitutional: NAD and appears stated age Neurological: Alert and oriented Psych: Appropriate affect and cooperative There were no vitals taken for this visit.   Comprehensive Musculoskeletal Exam:    Right shoulder is tender to palpation over the anterior glenohumeral joint.  Active range of motion to 90 degrees forward flexion, 20 degrees external rotation, and internal rotation to back pocket.  Left shoulder active ROM to 160 forward flexion, 30 degrees ER, and IR to L4.  Imaging:    Assessment:   57 y.o. male with significant right shoulder osteoarthritis.  Given that he is not a candidate for shoulder arthroplasty at this  time, he has been managing with cortisone injections which have helped some.  He would like this repeated today which I feel is reasonable given his last injection was over 5 months ago.  Cortisone injection was performed of the right shoulder using ultrasound and patient tolerated this procedure well.  Will plan to have him return to clinic as needed.  Plan :    -Cortisone injection performed today of the right shoulder under ultrasound guidance after patient consent    Procedure Note  Patient: Othon Aulden Moren             Date of Birth: 1965-12-24  MRN: [id number]             Visit Date: 11/01/2022  Procedures: Visit Diagnoses:  1. Primary osteoarthritis, right shoulder      Large Joint Inj: R glenohumeral on 11/01/2022 5:17 PM Indications: pain Details: 22 G 1.5 in needle, anterior approach Medications: 4 mL lidocaine 1 %; 2 mL triamcinolone acetonide 40 MG/ML Outcome: tolerated well, no immediate complications Procedure, treatment alternatives, risks and benefits explained, specific risks discussed. Consent was given by the patient. Immediately prior to procedure a time out was called to verify the correct patient, procedure, equipment, support staff and site/side marked as required. Patient was prepped and draped in the usual sterile fashion.       I personally saw and evaluated the patient, and participated in the management and treatment plan.   Hazle Nordmann, PA-C Orthopedics

## 2022-11-03 ENCOUNTER — Other Ambulatory Visit: Payer: Medicaid Other

## 2022-11-03 NOTE — Patient Instructions (Signed)
Visit Information  Logan Nelson was given information about Medicaid Managed Care team care coordination services as a part of their [facility] Community Plan Medicaid benefit. Logan Nelson verbally consented to engagement with the [facility] Managed Care team.   If you are experiencing a medical emergency, please call 911 or report to your local emergency department or urgent care.   If you have a non-emergency medical problem during routine business hours, please contact your provider's office and ask to speak with a nurse.   For questions related to your [facility], please call: [phone] or visit the homepage here: [email or web address]  If you would like to schedule transportation through your [facility], please call the following number at least 2 days in advance of your appointment: [phone]   Rides for urgent appointments can also be made after hours by calling Member Services.  Call the Behavioral Health Crisis Line at ([phone], at any time, 24 hours a day, 7 days a week. If you are in danger or need immediate medical attention call 911.  If you would like help to quit smoking, call 1-800-QUIT-NOW (938-517-8802) OR Espaol: 1-855-Djelo-Ya (7-253-664-4034) o para ms informacin haga clic aqu or Text READY to 742-595 to register via text  Mr. Fahey - following are the goals we discussed in your visit today:   Goals Addressed   None       Social Worker will follow up on 12/05/22.   Logan Nelson, Logan Nelson, MHA [facility]  Managed Medicaid Social Worker [phone]   Following is a copy of your plan of care:  There are no care plans that you recently modified to display for this patient.

## 2022-11-03 NOTE — Patient Outreach (Signed)
Medicaid Managed Care Social Work Note  11/03/2022 Name:  Logan Nelson MRN:  [id number] DOB:  03/08/1965  Logan Nelson is an 57 y.o. year old male who is a primary patient of [name], MD.  The Medicaid Managed Care Coordination team was consulted for assistance with:  Community Resources   Mr. Baumgartner was given information about Medicaid Managed Care Coordination team services today. Logan Nelson Patient agreed to services and verbal consent obtained.  Engaged with patient  for by telephone forfollow up visit in response to referral for case management and/or care coordination services.   Assessments/Interventions:  Review of past medical history, allergies, medications, health status, including review of consultants reports, laboratory and other test data, was performed as part of comprehensive evaluation and provision of chronic care management services.  SDOH: (Social Determinant of Health) assessments and interventions performed: SDOH Interventions    Flowsheet Row ED to Hosp-Admission (Discharged) from 06/16/2021 in [facility] 3E HF PCU  SDOH Interventions   Food Insecurity Interventions Intervention Not Indicated  Housing Interventions Intervention Not Indicated  Transportation Interventions Intervention Not Indicated     BSW completed a telephone outreach with patient, he states he is still living in his car. Patient states he does not have the title to his car and has a 30 day tag and does not like to go far. Patient does have a new address for resources to be sent to. BSW will send resources for housing.   Advanced Directives Status:  Not addressed in this encounter.  Care Plan                 No Known Allergies  Medications Reviewed Today   Medications were not reviewed in this encounter     Patient Active Problem List   Diagnosis Date Noted   Abscess in epidural space of cervical spine 06/23/2021   Epidural abscess    Acute  systolic (congestive) heart failure (HCC) 06/17/2021   Prolonged QT interval 06/17/2021   MSSA bacteremia    Pressure ulcer of sacral region, stage 3 (HCC)    Acute heart failure with preserved ejection fraction (HFpEF) (HCC) 06/13/2021   Anasarca    Pressure injury of sacral region, unstageable (HCC) 05/25/2021   Pressure injury of buttock, stage 2 (HCC) 05/14/2021   Hypomagnesemia 05/13/2021   Vitamin D deficiency 05/13/2021   Generalized weakness 05/12/2021   Hypophosphatemia 05/12/2021   Lower extremity weakness 05/11/2021   Hypokalemia 05/11/2021   Anemia of chronic disease 05/11/2021   GERD (gastroesophageal reflux disease) 05/11/2021   Bilateral inguinal hernia 04/23/2021   Protein-calorie malnutrition, severe 04/14/2021   Protein calorie malnutrition (HCC) 04/13/2021   Protein-calorie malnutrition (HCC) 04/12/2021   Hepatic steatosis 04/12/2021   Stroke (HCC) 04/11/2021   Small bowel obstruction (HCC) 10/13/2020   Anemia due to vitamin B12 deficiency 10/13/2020   Hypertension 01/09/2015   Incisional hernia 01/07/2015    Conditions to be addressed/monitored per PCP order:   housing resources  There are no care plans that you recently modified to display for this patient.   Follow up:  Patient agrees to Care Plan and Follow-up.  Plan: The Managed Medicaid care management team will reach out to the patient again over the next 30 days.  Date/time of next scheduled Social Work care management/care coordination outreach:  12/05/22  [name], [name], [facility]  Managed [facility] Social Worker [phone]

## 2022-11-08 DIAGNOSIS — F331 Major depressive disorder, recurrent, moderate: Secondary | ICD-10-CM | POA: Diagnosis not present

## 2022-11-10 DIAGNOSIS — F331 Major depressive disorder, recurrent, moderate: Secondary | ICD-10-CM | POA: Diagnosis not present

## 2022-11-15 ENCOUNTER — Telehealth: Payer: Self-pay | Admitting: Family Medicine

## 2022-11-15 NOTE — Telephone Encounter (Signed)
Patient is requesting provider try to contact Dr Coletta Memos as he said he has called several times and no one has called him back. Please f/u with patient

## 2022-11-16 DIAGNOSIS — F331 Major depressive disorder, recurrent, moderate: Secondary | ICD-10-CM | POA: Diagnosis not present

## 2022-11-17 NOTE — Telephone Encounter (Signed)
I have attempted without success to contact this patient by phone to return their call and I left a message on answering machine.

## 2022-11-17 NOTE — Telephone Encounter (Signed)
Patient has called, returning Logan Nelson's call. Per Flonnie Overman will return his call later today, patient advised of this. Patient's callback # [phone]

## 2022-11-18 NOTE — Telephone Encounter (Signed)
Patient has been call and given the direct number to make an appt for pain management with Ms. Shenelll [phone]

## 2022-11-23 ENCOUNTER — Telehealth: Payer: Self-pay | Admitting: Family Medicine

## 2022-11-23 NOTE — Telephone Encounter (Signed)
Pt is calling in requesting a callback regarding his pain medication. Pt says he has reached out to the pain clinic but when he calls it prompts him to leave a message but he never hears back. Pt says he is in pain and needs his medication. Pt can be reached at [phone]. Please follow up with pt.

## 2022-11-24 NOTE — Telephone Encounter (Signed)
Copied from CRM [id number]. Topic: Appointment Scheduling - Scheduling Inquiry for Clinic >> Nov 24, 2022 10:15 AM Phill Myron wrote: Attn: Kieth Brightly, RMA      Msg: 11/18/22  2:41 PM Note in patients chart:  (Patient has been call and given the direct number to make an appt for pain management with Ms. Shenelll [phone])   Telephone # was nonworking when I tried it 11/24/22 @10 :15 am  11/24/22: Reviewed chart Referral was sent to [facility] for Pain Management Ph. # [phone] Address [address] [place], [place] [address].

## 2022-11-24 NOTE — Telephone Encounter (Signed)
Call place to patient and LVM with number for pain management again. Patient is to reach out for himself.

## 2022-12-05 ENCOUNTER — Telehealth: Payer: Self-pay | Admitting: Family Medicine

## 2022-12-05 ENCOUNTER — Other Ambulatory Visit: Payer: Self-pay

## 2022-12-05 NOTE — Patient Outreach (Signed)
Medicaid Managed Care Social Work Note  12/05/2022 Name:  Logan Nelson MRN:  [id number] DOB:  Jun 24, 1965  Logan Nelson is an 57 y.o. year old male who is a primary patient of [name], MD.  The [facility] Managed Care Coordination team was consulted for assistance with:   housing   [name] was given information about Medicaid Managed Care Coordination team services today. Logan Nelson Patient agreed to services and verbal consent obtained.  Engaged with patient  for by telephone forfollow up visit in response to referral for case management and/or care coordination services.   Assessments/Interventions:  Review of past medical history, allergies, medications, health status, including review of consultants reports, laboratory and other test data, was performed as part of comprehensive evaluation and provision of chronic care management services.  SDOH: (Social Determinant of Health) assessments and interventions performed: SDOH Interventions    Flowsheet Row ED to Hosp-Admission (Discharged) from 06/16/2021 in [facility] 3E HF PCU  SDOH Interventions   Food Insecurity Interventions Intervention Not Indicated  Housing Interventions Intervention Not Indicated  Transportation Interventions Intervention Not Indicated     BSW completed a telephone outreach with patient, he states he is tired of going to pain management and not getting anything for pain. Patient states he did contacted his PCP. Patient states he did get the resources BSW sent but no one is answering the phone or called back, BSW encouraged patient to continue to try. Patient states he is still living in his car. He does get a hotel at the beginning of the month when he gets his check.  Advanced Directives Status:  Not addressed in this encounter.  Care Plan                 No Known Allergies  Medications Reviewed Today   Medications were not reviewed in this encounter     Patient  Active Problem List   Diagnosis Date Noted   Abscess in epidural space of cervical spine 06/23/2021   Epidural abscess    Acute systolic (congestive) heart failure (HCC) 06/17/2021   Prolonged QT interval 06/17/2021   MSSA bacteremia    Pressure ulcer of sacral region, stage 3 (HCC)    Acute heart failure with preserved ejection fraction (HFpEF) (HCC) 06/13/2021   Anasarca    Pressure injury of sacral region, unstageable (HCC) 05/25/2021   Pressure injury of buttock, stage 2 (HCC) 05/14/2021   Hypomagnesemia 05/13/2021   Vitamin D deficiency 05/13/2021   Generalized weakness 05/12/2021   Hypophosphatemia 05/12/2021   Lower extremity weakness 05/11/2021   Hypokalemia 05/11/2021   Anemia of chronic disease 05/11/2021   GERD (gastroesophageal reflux disease) 05/11/2021   Bilateral inguinal hernia 04/23/2021   Protein-calorie malnutrition, severe 04/14/2021   Protein calorie malnutrition (HCC) 04/13/2021   Protein-calorie malnutrition (HCC) 04/12/2021   Hepatic steatosis 04/12/2021   Stroke (HCC) 04/11/2021   Small bowel obstruction (HCC) 10/13/2020   Anemia due to vitamin B12 deficiency 10/13/2020   Hypertension 01/09/2015   Incisional hernia 01/07/2015    Conditions to be addressed/monitored per PCP order:   housing  There are no care plans that you recently modified to display for this patient.   Follow up:  Patient agrees to Care Plan and Follow-up.  Plan: The Managed Medicaid care management team will reach out to the patient again over the next 30 days.  Date/time of next scheduled Social Work care management/care coordination outreach:  01/10/23 Logan Nelson, Logan Nelson, [facility]  Managed Medicaid Social Worker [phone]

## 2022-12-05 NOTE — Telephone Encounter (Signed)
I have attempted without success to contact this patient by phone to return their call and I left a message on answering machine.

## 2022-12-05 NOTE — Patient Instructions (Signed)
Visit Information  Mr. Rollo was given information about Medicaid Managed Care team care coordination services as a part of their [name] Community Plan Medicaid benefit. [name] verbally consented to engagement with the [facility] Managed Care team.   If you are experiencing a medical emergency, please call 911 or report to your local emergency department or urgent care.   If you have a non-emergency medical problem during routine business hours, please contact your provider's office and ask to speak with a nurse.   For questions related to your [facility], please call: [phone] or visit the homepage here: [email or web address]  If you would like to schedule transportation through your [facility], please call the following number at least 2 days in advance of your appointment: [phone]   Rides for urgent appointments can also be made after hours by calling Member Services.  Call the Behavioral Health Crisis Line at [phone], at any time, 24 hours a day, 7 days a week. If you are in danger or need immediate medical attention call 911.  If you would like help to quit smoking, call 1-800-QUIT-NOW ((410)353-3156) OR Espaol: 1-855-Djelo-Ya (3-664-403-4742) o para ms informacin haga clic aqu or Text READY to 595-638 to register via text  Mr. [name] - following are the goals we discussed in your visit today:   Goals Addressed   None       Social Worker will follow up in 30 days.   [name], [name], MHA [facility]  Managed Medicaid Social Worker [phone]   Following is a copy of your plan of care:  There are no care plans that you recently modified to display for this patient.

## 2022-12-05 NOTE — Telephone Encounter (Signed)
Pt states that he is unaware how to check his vm. Pt is calling back.

## 2022-12-05 NOTE — Telephone Encounter (Signed)
Pt is calling to report that pain management is not giving him his pain pills. They keep checking his heart with an EKG. He is already aware that he has high blood pressure. Please advise CB- [phone]

## 2022-12-06 ENCOUNTER — Telehealth: Payer: Self-pay | Admitting: Family Medicine

## 2022-12-06 NOTE — Telephone Encounter (Unsigned)
Copied from CRM [id number]. Topic: General - Other >> Dec 06, 2022 10:06 AM Everette C wrote: Reason for CRM: The patient would like to be contacted by a member of practice staff when possible to review their medications   Please contact further when possible

## 2022-12-07 NOTE — Telephone Encounter (Signed)
I have attempted without success to contact this patient by phone to return their call and I left a message on answering machine.

## 2022-12-07 NOTE — Telephone Encounter (Signed)
Patient is upset that he is not able to get pain medication due to his HBP. Patient said that he would like to come in to be reevaluate.

## 2022-12-07 NOTE — Telephone Encounter (Signed)
I have attempted without success to contact this patient by phone to return their call.

## 2022-12-20 ENCOUNTER — Other Ambulatory Visit: Payer: Self-pay | Admitting: Family Medicine

## 2022-12-20 ENCOUNTER — Encounter: Payer: Self-pay | Admitting: Family Medicine

## 2022-12-20 ENCOUNTER — Ambulatory Visit (INDEPENDENT_AMBULATORY_CARE_PROVIDER_SITE_OTHER): Payer: 59 | Admitting: Family Medicine

## 2022-12-20 VITALS — BP 138/87 | HR 57 | Temp 98.0°F | Resp 18 | Ht 65.51 in | Wt 216.8 lb

## 2022-12-20 DIAGNOSIS — G894 Chronic pain syndrome: Secondary | ICD-10-CM

## 2022-12-20 MED ORDER — OXYCODONE-ACETAMINOPHEN 5-325 MG PO TABS: 1.0000 | ORAL_TABLET | Freq: Two times a day (BID) | ORAL | 0 refills | Status: DC | PRN

## 2022-12-20 NOTE — Telephone Encounter (Signed)
Requested medication (s) are due for refill today -no  Requested medication (s) are on the active medication list -yes  Future visit scheduled -yes  Last refill: 12/20/22  Notes to clinic: duplicate request- non delegated Rx- filled today  Requested Prescriptions  Pending Prescriptions Disp Refills   oxyCODONE-acetaminophen (PERCOCET/ROXICET) 5-325 MG tablet 30 tablet 0    Sig: Take 1 tablet by mouth 2 (two) times daily as needed for severe pain (pain score 7-10).     Not Delegated - Analgesics:  Opioid Agonist Combinations Failed - 12/20/2022  2:34 PM      Failed - This refill cannot be delegated      Failed - Urine Drug Screen completed in last 360 days      Passed - Valid encounter within last 3 months    Recent Outpatient Visits           Today    [place] [facility] at [facility], MD   1 month ago Anxiety and depression   [place] [facility] at [facility], MD   7 months ago Primary hypertension   [place] [facility] at [facility], MD   8 months ago Primary hypertension   [place] [facility] at [facility], MD   12 months ago Primary hypertension   [place] [facility] at [facility], MD       Future Appointments             In 3 months [name], MD [facility] Primary [facility] at [facility]               Requested Prescriptions  Pending Prescriptions Disp Refills   oxyCODONE-acetaminophen (PERCOCET/ROXICET) 5-325 MG tablet 30 tablet 0    Sig: Take 1 tablet by mouth 2 (two) times daily as needed for severe pain (pain score 7-10).     Not Delegated - Analgesics:  Opioid Agonist Combinations Failed - 12/20/2022  2:34 PM      Failed - This refill cannot be delegated      Failed - Urine Drug Screen completed in last 360 days      Passed - Valid encounter within last 3 months    Recent Outpatient Visits           Today    [facility], MD   1 month ago Anxiety and depression   [place] [facility] at [facility], MD   7 months ago Primary hypertension   [place] [facility] at [facility], MD   8 months ago Primary hypertension   [place] [facility] at [facility], MD   12 months ago Primary hypertension    Primary Care at [facility], MD       Future Appointments             In 3 months [name], MD [facility] Primary Care at [facility]

## 2022-12-20 NOTE — Telephone Encounter (Signed)
Medication Refill -  Most Recent Primary Care Visit:  Provider: [name]  Department: [facility]  Visit Type: OFFICE VISIT  Date: 12/20/2022  Medication:  oxyCODONE-acetaminophen (PERCOCET/ROXICET) 5-325 MG tablet   Has the patient contacted their pharmacy? Patient is currently at the pharmacy and states that the pharmacy does not have this medication but the chart shows it was sent and received by pharmacy  Is this the correct pharmacy for this prescription? Yes If no, delete pharmacy and type the correct one.  This is the patient's preferred pharmacy:  [facility] [address], [place] - [address] [place] [address] Phone: [phone] Fax: [phone]   Has the prescription been filled recently? I am not sure, the prescription is showing sent today but pharmacy states they do not have it.  Is the patient out of the medication? Yes.  Has the patient been seen for an appointment in the last year OR does the patient have an upcoming appointment? Yes

## 2022-12-21 ENCOUNTER — Telehealth: Payer: Self-pay | Admitting: Family Medicine

## 2022-12-21 NOTE — Telephone Encounter (Signed)
Copied from CRM [id number]. Topic: General - Other >> Dec 20, 2022  4:49 PM Macon Large wrote: Reason for CRM: Pt called for an update on the authorization for oxyCODONE-acetaminophen (PERCOCET/ROXICET) 5-325 MG tablet. Pt informed that the Rx was sent to the pharmacy but pt stated that he was told it needed an approval. >> Dec 21, 2022  7:42 AM De Blanch wrote: [facility] calling with the pt on the line stated a PA is needed for medication oxyCODONE-acetaminophen (PERCOCET/ROXICET) 5-325 MG tablet. Please advise.

## 2022-12-22 ENCOUNTER — Telehealth: Payer: Self-pay | Admitting: Family Medicine

## 2022-12-22 ENCOUNTER — Other Ambulatory Visit: Payer: Self-pay

## 2022-12-22 NOTE — Telephone Encounter (Signed)
Patient called again sttd pharmacy can't fill his script for oxyCODONE-acetaminophen (PERCOCET/ROXICET) 5-325 MG tablet until the provider call insurance and give authorization. Please f/u with pharmacy to see exactly what is needed.

## 2022-12-22 NOTE — Telephone Encounter (Signed)
This has been sent to [facility] for completion of PA

## 2022-12-23 ENCOUNTER — Other Ambulatory Visit: Payer: Self-pay

## 2022-12-23 NOTE — Telephone Encounter (Signed)
Pt is calling to check on the status of the PA- has it been completed?

## 2022-12-23 NOTE — Telephone Encounter (Signed)
Glennie Hawk Calling from [facility] Medicaid is calling to check on the status of the PA. Provider can call in the urgent PA as the patient as been waiting for three days for the medication and is out of medication. CB- [phone]

## 2022-12-23 NOTE — Telephone Encounter (Signed)
Pt called in to let Dr Andrey Campanile know that pharmacy is going to give pt a 5 day supply until PA is done

## 2022-12-26 ENCOUNTER — Encounter: Payer: Self-pay | Admitting: Family Medicine

## 2022-12-26 ENCOUNTER — Other Ambulatory Visit: Payer: Self-pay | Admitting: Family Medicine

## 2022-12-26 ENCOUNTER — Telehealth: Payer: Self-pay | Admitting: Family Medicine

## 2022-12-26 MED ORDER — OXYCODONE-ACETAMINOPHEN 5-325 MG PO TABS: 1.0000 | ORAL_TABLET | Freq: Two times a day (BID) | ORAL | 0 refills | Status: DC | PRN

## 2022-12-26 NOTE — Telephone Encounter (Signed)
Called patient to let him know medication has been sent to pharmacy from provider.

## 2022-12-26 NOTE — Progress Notes (Signed)
Established Patient Office Visit  Subjective    Patient ID: Logan Nelson, male    DOB: 05-Aug-1965  Age: 57 y.o. MRN: [id number]  CC:  Chief Complaint  Patient presents with   Follow-up    HPI Logan Nelson presents for complaint of chronic pain. Patient reports that he has been taking his meds but lost them the latest refill along with his back pack on public transportation.   Outpatient Encounter Medications as of 12/20/2022  Medication Sig   doxycycline (VIBRA-TABS) 100 MG tablet Take 1 tablet (100 mg total) by mouth 2 (two) times daily.   fexofenadine (ALLEGRA) 180 MG tablet Take 1 tablet (180 mg total) by mouth daily.   fluticasone (FLONASE) 50 MCG/ACT nasal spray Place 1 spray into both nostrils daily.   fluticasone-salmeterol (ADVAIR HFA) 115-21 MCG/ACT inhaler Inhale 2 puffs into the lungs 2 (two) times daily.   losartan (COZAAR) 50 MG tablet Take 1 tablet (50 mg total) by mouth daily.   metoprolol succinate (TOPROL-XL) 100 MG 24 hr tablet Take 1 tablet (100 mg total) by mouth daily. Take with or immediately following a meal.   oxyCODONE-acetaminophen (PERCOCET/ROXICET) 5-325 MG tablet Take 1 tablet by mouth 2 (two) times daily as needed for severe pain (pain score 7-10).   sertraline (ZOLOFT) 25 MG tablet Take 1 tablet (25 mg total) by mouth every morning.   spironolactone (ALDACTONE) 25 MG tablet Take 1 tablet (25 mg total) by mouth daily.   No facility-administered encounter medications on file as of 12/20/2022.    Past Medical History:  Diagnosis Date   Anemia    Arthritis    Bradycardia    Dyspnea    Falling episodes    GERD (gastroesophageal reflux disease)    Hernia of abdominal wall    Hypertension    Lower extremity edema     Past Surgical History:  Procedure Laterality Date   ABDOMINAL SURGERY     ANTERIOR CERVICAL DECOMP/DISCECTOMY FUSION N/A 06/23/2021   Procedure: Anterior cervical Decompression and epidural Abcess evacuation with  arthodesis cervical Four -five;  Surgeon: Coletta Memos, MD;  Location[facility] OR;  Service: Neurosurgery;  Laterality: N/A;   INGUINAL HERNIA REPAIR Right 04/30/2021   Procedure: HERNIA REPAIR INGUINAL WITH MESH;  Surgeon: Stechschulte, Hyman Hopes, MD;  Location: [facility] OR;  Service: General;  Laterality: Right;   LYSIS OF ADHESION  04/30/2021   Procedure: LYSIS OF ADHESION;  Surgeon: Quentin Ore, MD;  Location: [facility] OR;  Service: General;;   RADIOLOGY WITH ANESTHESIA N/A 06/16/2021   Procedure: MRI CERVICAL THORACIC LUMBER AND SACRUM;  Surgeon: Radiologist, Medication, MD;  Location: [facility] OR;  Service: Radiology;  Laterality: N/A;   RADIOLOGY WITH ANESTHESIA N/A 06/18/2021   Procedure: MRI WITH ANESTHESIA;  Surgeon: Radiologist, Medication, MD;  Location: [facility] OR;  Service: Radiology;  Laterality: N/A;   RADIOLOGY WITH ANESTHESIA N/A 06/20/2021   Procedure: MRI WITH ANESTHESIA;  Surgeon: Radiologist, Medication, MD;  Location: [facility] OR;  Service: Radiology;  Laterality: N/A;   UMBILICAL HERNIA REPAIR N/A 04/30/2021   Procedure: OPEN UMBILICAL AND EPIGASTRIC HERNIA REPAIR;  Surgeon: Quentin Ore, MD;  Location: [facility] OR;  Service: General;  Laterality: N/A;    History reviewed. No pertinent family history.  Social History   Socioeconomic History   Marital status: Single    Spouse name: Not on file   Number of children: 1   Years of education: Not on file   Highest education level: High school graduate  Occupational History    Comment: pending disability  Tobacco Use   Smoking status: Some Days    Current packs/day: 0.00    Types: Cigarettes    Last attempt to quit: 04/28/2020    Years since quitting: 2.6   Smokeless tobacco: Never  Vaping Use   Vaping status: Never Used  Substance and Sexual Activity   Alcohol use: Not Currently    Comment: before admission   Drug use: Not Currently    Types: Cocaine    Comment: past history   Sexual activity: Not Currently  Other Topics Concern   Not  on file  Social History Narrative   ** Merged History Encounter **       Social Determinants of Health   Financial Resource Strain: Medium Risk (09/16/2022)   Overall Financial Resource Strain (CARDIA)    Difficulty of Paying Living Expenses: Somewhat hard  Food Insecurity: Food Insecurity Present (10/26/2022)   Hunger Vital Sign    Worried About Running Out of Food in the Last Year: Sometimes true    Ran Out of Food in the Last Year: Sometimes true  Transportation Needs: No Transportation Needs (07/02/2021)   PRAPARE - Administrator, Civil Service (Medical): No    Lack of Transportation (Non-Medical): No  Physical Activity: Not on file  Stress: Not on file  Social Connections: Not on file  Intimate Partner Violence: Not At Risk (09/16/2022)   Humiliation, Afraid, Rape, and Kick questionnaire    Fear of Current or Ex-Partner: No    Emotionally Abused: No    Physically Abused: No    Sexually Abused: No    Review of Systems  All other systems reviewed and are negative.       Objective    BP 138/87 (BP Location: Right Arm, Patient Position: Sitting, Cuff Size: Normal)   Pulse (!) 57   Temp 98 F (36.7 C) (Oral)   Resp 18   Ht 5' 5.51" (1.664 m)   Wt 216 lb 12.8 oz (98.3 kg)   SpO2 96%   BMI 35.52 kg/m   Physical Exam Vitals and nursing note reviewed.  Constitutional:      General: He is not in acute distress.    Appearance: He is obese.  Cardiovascular:     Rate and Rhythm: Regular rhythm.  Pulmonary:     Effort: Pulmonary effort is normal.     Breath sounds: Normal breath sounds.  Abdominal:     Palpations: Abdomen is soft.     Tenderness: There is no abdominal tenderness.  Musculoskeletal:     Right lower leg: No edema.     Left lower leg: No edema.  Neurological:     General: No focal deficit present.     Mental Status: He is alert and oriented to person, place, and time.         Assessment & Plan:   Chronic pain syndrome  Other  orders -     oxyCODONE-Acetaminophen; Take 1 tablet by mouth 2 (two) times daily as needed for severe pain (pain score 7-10).  Dispense: 30 tablet; Refill: 0     No follow-ups on file.   Tommie Raymond, MD

## 2022-12-26 NOTE — Telephone Encounter (Signed)
Pt is calling in because he spoke with his insurance and they said that his oxyCODONE-acetaminophen (PERCOCET/ROXICET) 5-325 MG tablet [[id number]] has been approved and they are requesting Dr. Andrey Campanile send a new prescription over to the pharmacy. Pt is requesting this be done with urgency because he is in pain and needs his medication. Please follow up with Logan Nelson with any concerns.

## 2022-12-26 NOTE — Telephone Encounter (Signed)
Patient called back again to see if the script for his oxyCODONE-acetaminophen (PERCOCET/ROXICET) 5-325 MG tablet  as been sent. He stated his insurance has approved but they need provider to send another script. Please f/u wither patient

## 2022-12-26 NOTE — Telephone Encounter (Signed)
Medication Refill -  Most Recent Primary Care Visit:  Provider: [name]  Department: [facility]  Visit Type: OFFICE VISIT  Date: 12/20/2022  Medication: acetaminophen (PERCOCET/ROXICET) 5-325 MG tablet   Has the patient contacted their pharmacy? yes (Agent: If yes, when and what did the pharmacy advise?)contact pcp  Is this the correct pharmacy for this prescription? yes  This is the patient's preferred pharmacy:  [id number] [place], [place] - [address] [place] [address] Phone: [phone] Fax: [phone]    Has the prescription been filled recently? no  Is the patient out of the medication? yes  Has the patient been seen for an appointment in the last year OR does the patient have an upcoming appointment? yes  Can we respond through MyChart? yes  Agent: Please be advised that Rx refills may take up to 3 business days. We ask that you follow-up with your pharmacy.

## 2022-12-27 NOTE — Telephone Encounter (Signed)
Requested medications are due for refill today.  no  Requested medications are on the active medications list.  yes  Last refill. 12/26/2022 #30 0 rf  Future visit scheduled.   yes  Notes to clinic.  Pt wants refill sent to a different pharmacy. Refill not delegated.    Requested Prescriptions  Pending Prescriptions Disp Refills   oxyCODONE-acetaminophen (PERCOCET/ROXICET) 5-325 MG tablet 30 tablet 0    Sig: Take 1 tablet by mouth 2 (two) times daily as needed for severe pain (pain score 7-10).     Not Delegated - Analgesics:  Opioid Agonist Combinations Failed - 12/26/2022  5:23 PM      Failed - This refill cannot be delegated      Failed - Urine Drug Screen completed in last 360 days      Passed - Valid encounter within last 3 months    Recent Outpatient Visits           1 week ago Chronic pain syndrome   [place] [facility], MD   2 months ago Anxiety and depression   [place] [facility], MD   7 months ago Primary hypertension   [place] [facility] at [facility], MD   8 months ago Primary hypertension    Primary Care at [facility], MD   1 year ago Primary hypertension    Primary Care at [facility], MD       Future Appointments             In 2 months [name], MD [facility] at [facility]

## 2023-01-02 ENCOUNTER — Ambulatory Visit: Payer: Self-pay | Admitting: *Deleted

## 2023-01-02 NOTE — Telephone Encounter (Signed)
  Chief Complaint: Difficulty triaging pt as he was vague with his descriptions.   He was trying to walk into the social services building as he was talking with me.    He is dragging his left leg because his knee is hurting and swollen.   "I can't bend it".   Symptoms: The pain medicine is not helping.    They reduced the dose of the oxycodone to 5 mg from 10 mg and that is not helping.    Frequency: The last couple of days Pertinent Negatives: Patient denies N/A Disposition: [] ED /[] Urgent Care (no appt availability in office) / [x] Appointment(In office/virtual)/ []  [facility]/ [] Home Care/ [] Refused Recommended Disposition /[] [facility]/ []  Follow-up with PCP Additional Notes: Appt made with Dr. Andrey Campanile for 01/09/2023 at 8:40.    I instructed him to go to the ED if he gets worse.   "I'm thinking about doing that anyway".   He mentioned he is dragging his left leg while ambulating into the social services office.   He made it inside.

## 2023-01-02 NOTE — Telephone Encounter (Signed)
I'm having trouble walking.   I'm trying.   I need to get into social services.    I just hurt.   My leg is swollen on the left.   I've been having trouble with my back.   Dr. Andrey Campanile gave me pain medicine but it was not working.    She reduced my pain medicine and now it don't work.   I had an infection on my spine and could not walk but now it seems like it's coming back.     I'm moving right now.   I'm upright.  My left arm is not hurting.   I was in the hospital I forgot what the problem was.   The dr drained this stuff off of me.    The oxycodone 10 mg helped me a lot.   She reduced it to 5 mg.   That don't really work.    I've got to be able to move.   It's my knee really bothering me.  Reason for Disposition  [1] Leg pain which occurs after walking a certain distance AND [2] disappears with rest AND [3] age > 58  Answer Assessment - Initial Assessment Questions 1. ONSET: "When did the pain start?"      My left leg started hurting a couple of days ago.     2. LOCATION: "Where is the pain located?"      Left leg  3. PAIN: "How bad is the pain?"    (Scale 1-10; or mild, moderate, severe)   -  MILD (1-3): doesn't interfere with normal activities    -  MODERATE (4-7): interferes with normal activities (e.g., work or school) or awakens from sleep, limping    -  SEVERE (8-10): excruciating pain, unable to do any normal activities, unable to walk     Severe     I'm out side of social services now.    I'm trying to get into the building.   I got to the car but now I can't get into the social services building.     4. WORK OR EXERCISE: "Has there been any recent work or exercise that involved this part of the body?"      No    My ankle is swollen.    I can feel it in my knee.   My knee is swelling.   5. CAUSE: "What do you think is causing the leg pain?"     I think arthritis in my knee.   But I'm not sure. 6. OTHER SYMPTOMS: "Do you have any other symptoms?" (e.g., chest pain, back pain,  breathing difficulty, swelling, rash, fever, numbness, weakness)     Swelling around left ankle.   Difficulty walking. 7. PREGNANCY: "Is there any chance you are pregnant?" "When was your last menstrual period?"     N/A  Protocols used: Leg Pain-A-AH

## 2023-01-09 ENCOUNTER — Encounter: Payer: Self-pay | Admitting: Family Medicine

## 2023-01-09 ENCOUNTER — Ambulatory Visit (INDEPENDENT_AMBULATORY_CARE_PROVIDER_SITE_OTHER): Payer: 59 | Admitting: Family Medicine

## 2023-01-09 VITALS — BP 132/90 | HR 62 | Temp 97.6°F | Resp 20 | Ht 66.5 in | Wt 218.4 lb

## 2023-01-09 DIAGNOSIS — G894 Chronic pain syndrome: Secondary | ICD-10-CM | POA: Diagnosis not present

## 2023-01-09 DIAGNOSIS — M25562 Pain in left knee: Secondary | ICD-10-CM | POA: Diagnosis not present

## 2023-01-09 DIAGNOSIS — I10 Essential (primary) hypertension: Secondary | ICD-10-CM | POA: Diagnosis not present

## 2023-01-09 NOTE — Progress Notes (Signed)
Established Patient Office Visit  Subjective    Patient ID: Logan Nelson, male    DOB: 1966/02/03  Age: 57 y.o. MRN: [id number]  CC:  Chief Complaint  Patient presents with   left knee and leg pain    HPI Logan Nelson presents for follow up of chronic pain. He reports that transportation issues have deterred him from doing pain management at this time. Patient also reports that he hasn't been taking his htn meds on a consistent basis.   Outpatient Encounter Medications as of 01/09/2023  Medication Sig   doxycycline (VIBRA-TABS) 100 MG tablet Take 1 tablet (100 mg total) by mouth 2 (two) times daily.   fexofenadine (ALLEGRA) 180 MG tablet Take 1 tablet (180 mg total) by mouth daily.   fluticasone (FLONASE) 50 MCG/ACT nasal spray Place 1 spray into both nostrils daily.   fluticasone-salmeterol (ADVAIR HFA) 115-21 MCG/ACT inhaler Inhale 2 puffs into the lungs 2 (two) times daily.   losartan (COZAAR) 50 MG tablet Take 1 tablet (50 mg total) by mouth daily.   metoprolol succinate (TOPROL-XL) 100 MG 24 hr tablet Take 1 tablet (100 mg total) by mouth daily. Take with or immediately following a meal.   oxyCODONE-acetaminophen (PERCOCET/ROXICET) 5-325 MG tablet Take 1 tablet by mouth 2 (two) times daily as needed for severe pain (pain score 7-10).   sertraline (ZOLOFT) 25 MG tablet Take 1 tablet (25 mg total) by mouth every morning.   spironolactone (ALDACTONE) 25 MG tablet Take 1 tablet (25 mg total) by mouth daily.   No facility-administered encounter medications on file as of 01/09/2023.    Past Medical History:  Diagnosis Date   Anemia    Arthritis    Bradycardia    Dyspnea    Falling episodes    GERD (gastroesophageal reflux disease)    Hernia of abdominal wall    Hypertension    Lower extremity edema     Past Surgical History:  Procedure Laterality Date   ABDOMINAL SURGERY     ANTERIOR CERVICAL DECOMP/DISCECTOMY FUSION N/A 06/23/2021   Procedure: Anterior  cervical Decompression and epidural Abcess evacuation with arthodesis cervical Four -five;  Surgeon: Coletta Memos, MD;  Location[facility] OR;  Service: Neurosurgery;  Laterality: N/A;   INGUINAL HERNIA REPAIR Right 04/30/2021   Procedure: HERNIA REPAIR INGUINAL WITH MESH;  Surgeon: Stechschulte, Hyman Hopes, MD;  Location: [facility] OR;  Service: General;  Laterality: Right;   LYSIS OF ADHESION  04/30/2021   Procedure: LYSIS OF ADHESION;  Surgeon: Quentin Ore, MD;  Location: [facility] OR;  Service: General;;   RADIOLOGY WITH ANESTHESIA N/A 06/16/2021   Procedure: MRI CERVICAL THORACIC LUMBER AND SACRUM;  Surgeon: Radiologist, Medication, MD;  Location: [facility] OR;  Service: Radiology;  Laterality: N/A;   RADIOLOGY WITH ANESTHESIA N/A 06/18/2021   Procedure: MRI WITH ANESTHESIA;  Surgeon: Radiologist, Medication, MD;  Location: [facility] OR;  Service: Radiology;  Laterality: N/A;   RADIOLOGY WITH ANESTHESIA N/A 06/20/2021   Procedure: MRI WITH ANESTHESIA;  Surgeon: Radiologist, Medication, MD;  Location: [facility] OR;  Service: Radiology;  Laterality: N/A;   UMBILICAL HERNIA REPAIR N/A 04/30/2021   Procedure: OPEN UMBILICAL AND EPIGASTRIC HERNIA REPAIR;  Surgeon: Quentin Ore, MD;  Location: [facility] OR;  Service: General;  Laterality: N/A;    History reviewed. No pertinent family history.  Social History   Socioeconomic History   Marital status: Single    Spouse name: Not on file   Number of children: 1   Years of  education: Not on file   Highest education level: High school graduate  Occupational History    Comment: pending disability  Tobacco Use   Smoking status: Some Days    Current packs/day: 0.00    Types: Cigarettes    Last attempt to quit: 04/28/2020    Years since quitting: 2.7   Smokeless tobacco: Never  Vaping Use   Vaping status: Never Used  Substance and Sexual Activity   Alcohol use: Not Currently    Comment: before admission   Drug use: Not Currently    Types: Cocaine    Comment: past history    Sexual activity: Not Currently  Other Topics Concern   Not on file  Social History Narrative   ** Merged History Encounter **       Social Determinants of Health   Financial Resource Strain: Medium Risk (09/16/2022)   Overall Financial Resource Strain (CARDIA)    Difficulty of Paying Living Expenses: Somewhat hard  Food Insecurity: Food Insecurity Present (10/26/2022)   Hunger Vital Sign    Worried About Running Out of Food in the Last Year: Sometimes true    Ran Out of Food in the Last Year: Sometimes true  Transportation Needs: No Transportation Needs (07/02/2021)   PRAPARE - Administrator, Civil Service (Medical): No    Lack of Transportation (Non-Medical): No  Physical Activity: Not on file  Stress: Not on file  Social Connections: Not on file  Intimate Partner Violence: Not At Risk (09/16/2022)   Humiliation, Afraid, Rape, and Kick questionnaire    Fear of Current or Ex-Partner: No    Emotionally Abused: No    Physically Abused: No    Sexually Abused: No    Review of Systems  All other systems reviewed and are negative.       Objective    BP (!) 132/90 (BP Location: Right Arm, Patient Position: Sitting, Cuff Size: Large)   Pulse 62   Temp 97.6 F (36.4 C) (Oral)   Resp 20   Ht 5' 6.5" (1.689 m)   Wt 218 lb 6.4 oz (99.1 kg)   SpO2 95%   BMI 34.72 kg/m   Physical Exam Vitals and nursing note reviewed.  Constitutional:      General: He is not in acute distress.    Appearance: He is obese.  Cardiovascular:     Rate and Rhythm: Regular rhythm.  Pulmonary:     Effort: Pulmonary effort is normal.     Breath sounds: Normal breath sounds.  Abdominal:     Palpations: Abdomen is soft.     Tenderness: There is no abdominal tenderness.  Musculoskeletal:     Right lower leg: No edema.     Left lower leg: No edema.  Neurological:     General: No focal deficit present.     Mental Status: He is alert and oriented to person, place, and time.          Assessment & Plan:   Chronic pain syndrome  Essential hypertension  Left knee pain, unspecified chronicity -     Ambulatory referral to Orthopedic Surgery     Return if symptoms worsen or fail to improve.   Tommie Raymond, MD

## 2023-01-12 ENCOUNTER — Other Ambulatory Visit: Payer: Self-pay

## 2023-01-12 NOTE — Patient Instructions (Signed)
Visit Information  [name] was given information about Medicaid Managed Care team care coordination services as a part of their [name] Community Plan Medicaid benefit. Logan Nelson verbally consented to engagement with the [facility] Managed Care team.   If you are experiencing a medical emergency, please call 911 or report to your local emergency department or urgent care.   If you have a non-emergency medical problem during routine business hours, please contact your provider's office and ask to speak with a nurse.   For questions related to your [facility], please call: [phone] or visit the homepage here: [email or web address]  If you would like to schedule transportation through your [facility], please call the following number at least 2 days in advance of your appointment: [phone]   Rides for urgent appointments can also be made after hours by calling Member Services.  Call the Behavioral Health Crisis Line at [phone], at any time, 24 hours a day, 7 days a week. If you are in danger or need immediate medical attention call 911.  If you would like help to quit smoking, call 1-800-QUIT-NOW ((410)353-3156) OR Espaol: 1-855-Djelo-Ya (3-664-403-4742) o para ms informacin haga clic aqu or Text READY to 595-638 to register via text  Logan Nelson - following are the goals we discussed in your visit today:   Goals Addressed   None       Social Worker will follow up in 30 days.   Logan Nelson, Logan Nelson, MHA [facility]  Managed Medicaid Social Worker [phone]   Following is a copy of your plan of care:  There are no care plans that you recently modified to display for this patient.

## 2023-01-12 NOTE — Patient Outreach (Signed)
  Medicaid Managed Care Social Work Note  01/12/2023 Name:  Logan Nelson MRN:  [id number] DOB:  07/08/1965  Logan Nelson is an 57 y.o. year old male who is a primary patient of [name], MD.  [facility] Managed Care Coordination team was consulted for assistance with:   housing   Mr. [name] was given information about Medicaid Managed Care Coordination team services today. [name] Patient agreed to services and verbal consent obtained.  Engaged with patient  for by telephone forfollow up visit in response to referral for case management and/or care coordination services.   Assessments/Interventions:  Review of past medical history, allergies, medications, health status, including review of consultants reports, laboratory and other test data, was performed as part of comprehensive evaluation and provision of chronic care management services.  SDOH: (Social Determinant of Health) assessments and interventions performed: SDOH Interventions    Flowsheet Row ED to Hosp-Admission (Discharged) from 06/16/2021 in [facility] 3E HF PCU  SDOH Interventions   Food Insecurity Interventions Intervention Not Indicated  Housing Interventions Intervention Not Indicated  Transportation Interventions Intervention Not Indicated      BSW completed a telephone outreach with patient, he states he has a few places that he can stay. He reports he still has not heard anything from any of the resources but will continue to try.  Advanced Directives Status:  Not addressed in this encounter.  Care Plan                 No Known Allergies  Medications Reviewed Today   Medications were not reviewed in this encounter     Patient Active Problem List   Diagnosis Date Noted   Abscess in epidural space of cervical spine 06/23/2021   Epidural abscess    Acute systolic (congestive) heart failure (HCC) 06/17/2021   Prolonged QT interval 06/17/2021   MSSA bacteremia     Pressure ulcer of sacral region, stage 3 (HCC)    Acute heart failure with preserved ejection fraction (HFpEF) (HCC) 06/13/2021   Anasarca    Pressure injury of sacral region, unstageable (HCC) 05/25/2021   Pressure injury of buttock, stage 2 (HCC) 05/14/2021   Hypomagnesemia 05/13/2021   Vitamin D deficiency 05/13/2021   Generalized weakness 05/12/2021   Hypophosphatemia 05/12/2021   Lower extremity weakness 05/11/2021   Hypokalemia 05/11/2021   Anemia of chronic disease 05/11/2021   GERD (gastroesophageal reflux disease) 05/11/2021   Bilateral inguinal hernia 04/23/2021   Protein-calorie malnutrition, severe 04/14/2021   Protein calorie malnutrition (HCC) 04/13/2021   Protein-calorie malnutrition (HCC) 04/12/2021   Hepatic steatosis 04/12/2021   Stroke (HCC) 04/11/2021   Small bowel obstruction (HCC) 10/13/2020   Anemia due to vitamin B12 deficiency 10/13/2020   Hypertension 01/09/2015   Incisional hernia 01/07/2015    Conditions to be addressed/monitored per PCP order:   housing  There are no care plans that you recently modified to display for this patient.   Follow up:  Patient agrees to Care Plan and Follow-up.  Plan: The Managed Medicaid care management team will reach out to the patient again over the next 30 days.  Date/time of next scheduled Social Work care management/care coordination outreach:  02/13/22  Logan Nelson, Logan Nelson, [facility]  Managed [facility] Social Worker [phone]

## 2023-01-17 ENCOUNTER — Ambulatory Visit (HOSPITAL_BASED_OUTPATIENT_CLINIC_OR_DEPARTMENT_OTHER): Payer: 59 | Admitting: Student

## 2023-01-25 ENCOUNTER — Other Ambulatory Visit: Payer: Self-pay | Admitting: Family Medicine

## 2023-01-25 ENCOUNTER — Ambulatory Visit: Payer: Medicaid Other | Admitting: Family Medicine

## 2023-01-25 NOTE — Telephone Encounter (Signed)
Medication Refill -  Most Recent Primary Care Visit:  Provider: [name]  Department: [facility]  Visit Type: OFFICE VISIT  Date: 01/09/2023  Medication: oxyCODONE-acetaminophen (PERCOCET/ROXICET) 5-325 MG tablet [[id number]]   Has the patient contacted their pharmacy? Yes  (Agent: If yes, when and what did the pharmacy advise?) Contact PCP   Is this the correct pharmacy for this prescription? Yes  This is the patient's preferred pharmacy:  [facility] [address] [place] - [address] [place] [address] Phone: [phone] Fax: [phone]  Has the prescription been filled recently? Yes  Is the patient out of the medication? Yes  Has the patient been seen for an appointment in the last year OR does the patient have an upcoming appointment? Yes  Can we respond through MyChart? No  Agent: Please be advised that Rx refills may take up to 3 business days. We ask that you follow-up with your pharmacy.

## 2023-01-25 NOTE — Telephone Encounter (Signed)
Requested medication (s) are due for refill today - yes  Requested medication (s) are on the active medication list -yes  Future visit scheduled -yes  Last refill: 12/26/22 #30  Notes to clinic: non delegated Rx  Requested Prescriptions  Pending Prescriptions Disp Refills   oxyCODONE-acetaminophen (PERCOCET/ROXICET) 5-325 MG tablet 30 tablet 0    Sig: Take 1 tablet by mouth 2 (two) times daily as needed for severe pain (pain score 7-10).     Not Delegated - Analgesics:  Opioid Agonist Combinations Failed - 01/25/2023  2:51 PM      Failed - This refill cannot be delegated      Failed - Urine Drug Screen completed in last 360 days      Passed - Valid encounter within last 3 months    Recent Outpatient Visits           2 weeks ago Chronic pain syndrome   [place] [facility], MD   1 month ago Chronic pain syndrome   [place] [facility], MD   3 months ago Anxiety and depression   [place] [facility], MD   8 months ago Primary hypertension   [place] [facility], MD   9 months ago Primary hypertension   [place] [facility] Lauris Poag, MD       Future Appointments             In 1 month [name], MD [facility] at [facility]               Requested Prescriptions  Pending Prescriptions Disp Refills   oxyCODONE-acetaminophen (PERCOCET/ROXICET) 5-325 MG tablet 30 tablet 0    Sig: Take 1 tablet by mouth 2 (two) times daily as needed for severe pain (pain score 7-10).     Not Delegated - Analgesics:  Opioid Agonist Combinations Failed - 01/25/2023  2:51 PM      Failed - This refill cannot be delegated      Failed - Urine Drug Screen completed in last 360 days      Passed - Valid encounter within last 3 months    Recent Outpatient Visits           2 weeks ago Chronic  pain syndrome   [place] [facility], MD   1 month ago Chronic pain syndrome   [place] [facility], MD   3 months ago Anxiety and depression   [place] [facility], MD   8 months ago Primary hypertension   [place] [facility], MD   9 months ago Primary hypertension   [employer] [facility], MD       Future Appointments             In 1 month [name], MD [facility]

## 2023-01-26 ENCOUNTER — Other Ambulatory Visit: Payer: Self-pay | Admitting: Family Medicine

## 2023-01-26 ENCOUNTER — Telehealth: Payer: Self-pay | Admitting: Family Medicine

## 2023-01-26 MED ORDER — OXYCODONE-ACETAMINOPHEN 5-325 MG PO TABS: 1.0000 | ORAL_TABLET | Freq: Two times a day (BID) | ORAL | 0 refills | Status: DC | PRN

## 2023-01-26 NOTE — Telephone Encounter (Signed)
Medication Refill -  Most Recent Primary Care Visit:  Provider: [name]  Department: [facility]  Visit Type: OFFICE VISIT  Date: 01/09/2023  Medication: oxyCODONE-acetaminophen (PERCOCET/ROXICET) 5-325 MG tablet   Pt is upset that his medication is not at the pharmacy. Pt requested medication on 01/25/2023. I did explain to pt that it could take up to 3 business days for the refill and did explain to the pt that the nurse sent the request over to his PCP on yesterday. We are waiting for his PCP to send in the medication but she has up to 48-72 business hours ( up to 3 business days) for the refill. Pt is wanting another request to be sent and states that his PCP told him it would be called on for him on 01/25/2023 and he is wanting to know why it take so long.   Has the patient contacted their pharmacy? Yes  Is this the correct pharmacy for this prescription? Yes If no, delete pharmacy and type the correct one.  This is the patient's preferred pharmacy:  [facility] [address] - [address] [place] [address] Phone: [phone] Fax: [phone]     Has the prescription been filled recently? Yes  Is the patient out of the medication? Yes  Has the patient been seen for an appointment in the last year OR does the patient have an upcoming appointment? Yes  Can we respond through MyChart? No  Agent: Please be advised that Rx refills may take up to 3 business days. We ask that you follow-up with your pharmacy.

## 2023-01-26 NOTE — Telephone Encounter (Signed)
Refill sent in to pharmacy on 12/19

## 2023-02-02 ENCOUNTER — Encounter: Payer: Self-pay | Admitting: *Deleted

## 2023-02-02 ENCOUNTER — Ambulatory Visit: Payer: Self-pay

## 2023-02-02 NOTE — Telephone Encounter (Signed)
This encounter was created in error - please disregard.

## 2023-02-02 NOTE — Telephone Encounter (Signed)
Summary: Pt reports that he walked in to a door and his teeth are loose.   Pt reports that he walked in to a door and his teeth are loose. Pt stated he tried to contact the dentist but the office was not open. Pt requests call back to advise. Cb# [phone]          Chief Complaint: Bottom tooth loose, painful. Walked into a door yesterday. Asking if practice knows of an emergency dentist - his dental office is closed. Symptoms: Pain Frequency: Yesterday Pertinent Negatives: Patient denies  Disposition: [] ED /[] Urgent Care (no appt availability in office) / [] Appointment(In office/virtual)/ []  [place] [facility]/ [] Home Care/ [] Refused Recommended Disposition /[]  Mobile Bus/ [x]  Follow-up with PCP Additional Notes: Please advise pt.  Reason for Disposition  [1] SEVERE pain (e.g., excruciating, unable to eat, unable to do any normal activities) AND [2] not improved 2 hours after pain medicine  Answer Assessment - Initial Assessment Questions 1. LOCATION: "Which tooth is hurting?"  (e.g., right-side/left-side, upper/lower, front/back)     Front bottom 2. ONSET: "When did the toothache start?"  (e.g., hours, days)      Yesterday 3. SEVERITY: "How bad is the toothache?"  (Scale 1-10; mild, moderate or severe)   - MILD (1-3): doesn't interfere with chewing    - MODERATE (4-7): interferes with chewing, interferes with normal activities, awakens from sleep     - SEVERE (8-10): unable to eat, unable to do any normal activities, excruciating pain        10 4. SWELLING: "Is there any visible swelling of your face?"     Jaw, bottom 5. OTHER SYMPTOMS: "Do you have any other symptoms?" (e.g., fever)     No 6. PREGNANCY: "Is there any chance you are pregnant?" "When was your last menstrual period?"     N/a  Protocols used: Toothache-A-AH

## 2023-02-02 NOTE — Telephone Encounter (Signed)
Patient called back and would like to know what Dr. Andrey Campanile recommends regarding tooth. Patient denies bleeding at this time and tooth is loose but intact. Difficulty talking. Recommended patient go to UC for evaluation. Requesting call back.

## 2023-02-02 NOTE — Telephone Encounter (Signed)
Called pt and left vm recommending ED, and will send dental resources via mail

## 2023-02-03 ENCOUNTER — Ambulatory Visit: Payer: Self-pay | Admitting: *Deleted

## 2023-02-03 NOTE — Telephone Encounter (Signed)
  Chief Complaint: tooth pain unable to eat or drink Symptoms: walked into a door tooth side ways and severe pain , unable to eat or drink becoming dizzy.  Frequency: 3 days  Pertinent Negatives: Patient denies na  Disposition: [x] ED /[] Urgent Care (no appt availability in office) / [] Appointment(In office/virtual)/ []  [place] [facility]/ [] Home Care/ [] Refused Recommended Disposition /[] [place] Mobile Bus/ []  Follow-up with PCP Additional Notes:   See previous encounters for same issue. Went to UC as directed yesterday and patient report UC would not see him . Instructed patient to go to ED. Patient does not want to go to ED due to they will refer him to find a dentist. Patient requesting any help he can get from PCP. Offered dental clinic to call and number for [facility] #[phone], but office is located in [place] and unsure patient will be able to travel . Please advise of any dentist available . Patient requesting a call back.      Reason for Disposition  [1] SEVERE pain (e.g., excruciating, unable to eat, unable to do any normal activities) AND [2] not improved 2 hours after pain medicine  Answer Assessment - Initial Assessment Questions 1. LOCATION: "Which tooth is hurting?"  (e.g., right-side/left-side, upper/lower, front/back)     Walked into door and "knocked tooth sideways" 2. ONSET: "When did the toothache start?"  (e.g., hours, days)      See previous encounters / 3 days 3. SEVERITY: "How bad is the toothache?"  (Scale 1-10; mild, moderate or severe)   - MILD (1-3): doesn't interfere with chewing    - MODERATE (4-7): interferes with chewing, interferes with normal activities, awakens from sleep     - SEVERE (8-10): unable to eat, unable to do any normal activities, excruciating pain        severe 4. SWELLING: "Is there any visible swelling of your face?"     Na  5. OTHER SYMPTOMS: "Do you have any other symptoms?" (e.g., fever)     Difficulty  eating and drinking  6. PREGNANCY: "Is there any chance you are pregnant?" "When was your last menstrual period?"     na  Protocols used: [facility]

## 2023-02-04 ENCOUNTER — Emergency Department (HOSPITAL_COMMUNITY)
Admission: EM | Admit: 2023-02-04 | Discharge: 2023-02-04 | Disposition: A | Discharge status: Home / Self Care | Payer: Medicaid Other | Attending: Emergency Medicine | Admitting: Emergency Medicine

## 2023-02-04 ENCOUNTER — Other Ambulatory Visit (HOSPITAL_COMMUNITY): Payer: Self-pay

## 2023-02-04 ENCOUNTER — Other Ambulatory Visit: Payer: Self-pay

## 2023-02-04 ENCOUNTER — Encounter (HOSPITAL_COMMUNITY): Payer: Self-pay

## 2023-02-04 DIAGNOSIS — K0889 Other specified disorders of teeth and supporting structures: Secondary | ICD-10-CM | POA: Insufficient documentation

## 2023-02-04 DIAGNOSIS — I10 Essential (primary) hypertension: Secondary | ICD-10-CM | POA: Diagnosis not present

## 2023-02-04 DIAGNOSIS — Z79899 Other long term (current) drug therapy: Secondary | ICD-10-CM | POA: Insufficient documentation

## 2023-02-04 MED ORDER — AMOXICILLIN-POT CLAVULANATE 875-125 MG PO TABS: 1.0000 | ORAL_TABLET | Freq: Two times a day (BID) | ORAL | 0 refills | Status: DC

## 2023-02-04 MED ORDER — AMOXICILLIN-POT CLAVULANATE 875-125 MG PO TABS
1.0000 | ORAL_TABLET | Freq: Two times a day (BID) | ORAL | 0 refills | Status: AC
  Filled 2023-02-04: qty 14, 7d supply, fill #0

## 2023-02-04 MED ORDER — AMOXICILLIN-POT CLAVULANATE 875-125 MG PO TABS
1.0000 | ORAL_TABLET | Freq: Once | ORAL | Status: AC
  Administered 2023-02-04: 1 via ORAL
  Filled 2023-02-04: qty 1

## 2023-02-04 NOTE — ED Provider Notes (Signed)
[place] EMERGENCY DEPARTMENT AT [facility] Provider Note   CSN: [id number] Arrival date & time: 02/04/23  1027     History  Chief Complaint  Patient presents with   Dental Problem    Logan Nelson is a 57 y.o. male with PMHx bradycardia, arthritis, GERD, HTN who presents to ED concerned for dental pain x3 days. Patient stating that he hit his mouth on a door earlier this week. Patient states that it hurts to eat d/t the pain, but is able to tolerate PO intake. Patient stating that he has not seen a dentist.   HPI     Home Medications Prior to Admission medications   Medication Sig Start Date End Date Taking? Authorizing Provider  amoxicillin-clavulanate (AUGMENTIN) 875-125 MG tablet Take 1 tablet by mouth every 12 (twelve) hours for 7 days. 02/04/23 02/11/23  Dorthy Cooler, PA-C  fexofenadine (ALLEGRA) 180 MG tablet Take 1 tablet (180 mg total) by mouth daily. 05/23/22   Martina Sinner, MD  fluticasone (FLONASE) 50 MCG/ACT nasal spray Place 1 spray into both nostrils daily. 05/23/22   Martina Sinner, MD  fluticasone-salmeterol (ADVAIR HFA) 324-40 MCG/ACT inhaler Inhale 2 puffs into the lungs 2 (two) times daily. 05/23/22   Martina Sinner, MD  losartan (COZAAR) 50 MG tablet Take 1 tablet (50 mg total) by mouth daily. 10/26/22   [name], MD  metoprolol succinate (TOPROL-XL) 100 MG 24 hr tablet Take 1 tablet (100 mg total) by mouth daily. Take with or immediately following a meal. 10/26/22   [name], MD  oxyCODONE-acetaminophen (PERCOCET/ROXICET) 5-325 MG tablet Take 1 tablet by mouth 2 (two) times daily as needed for severe pain (pain score 7-10). 01/26/23   [name], MD  sertraline (ZOLOFT) 25 MG tablet Take 1 tablet (25 mg total) by mouth every morning. 10/26/22   [name], MD  spironolactone (ALDACTONE) 25 MG tablet Take 1 tablet (25 mg total) by mouth daily. 10/26/22   [name], MD      Allergies    Patient has no  known allergies.    Review of Systems   Review of Systems  HENT:  Positive for dental problem.     Physical Exam Updated Vital Signs BP (!) 147/99 (BP Location: Right Arm)   Pulse (!) 55   Temp (!) 97.4 F (36.3 C) (Oral)   Resp 18   Ht 5\' 6"  (1.676 m)   Wt 98.9 kg   SpO2 100%   BMI 35.19 kg/m  Physical Exam Vitals and nursing note reviewed.  Constitutional:      General: He is not in acute distress.    Appearance: He is not ill-appearing or toxic-appearing.  HENT:     Head: Normocephalic and atraumatic.     Mouth/Throat:     Comments: Most of teeth are missing. One last tooth appears rotted and loose. No obvious oral swelling or lesions.  Eyes:     General: No scleral icterus.       Right eye: No discharge.        Left eye: No discharge.     Conjunctiva/sclera: Conjunctivae normal.  Cardiovascular:     Rate and Rhythm: Normal rate.  Pulmonary:     Effort: Pulmonary effort is normal.  Abdominal:     General: Abdomen is flat.  Skin:    General: Skin is warm and dry.  Neurological:     General: No focal deficit present.     Mental Status: He  is alert. Mental status is at baseline.  Psychiatric:        Mood and Affect: Mood normal.        Behavior: Behavior normal.     ED Results / Procedures / Treatments   Labs (all labs ordered are listed, but only abnormal results are displayed) Labs Reviewed - No data to display  EKG None  Radiology No results found.  Procedures Procedures    Medications Ordered in ED Medications  amoxicillin-clavulanate (AUGMENTIN) 875-125 MG per tablet 1 tablet (1 tablet Oral Given 02/04/23 1316)    ED Course/ Medical Decision Making/ A&P                                 Medical Decision Making   This patient presents to the ED for concern of dental pain, this involves an extensive number of treatment options, and is a complaint that carries with it a high risk of complications and morbidity.  The differential diagnosis  includes deep space infection   Co morbidities that complicate the patient evaluation  Bradycardia, GERD, HTN   Additional history obtained:  Dr. Andrey Campanile PCP  Problem List / ED Course / Critical interventions / Medication management  Patient presents to ED concerned for dental pain x3 days. Patient stating that he hit his mouth on a door recently. Physical exam showing multiple missing teeth - this last tooth appears infected and loose. No obvious oral swelling or other lesions. Patient afebrile with stable vitals. Provided patient with a dose of Augmentin in ED and with discharge with a 7 day course of Augmentin. Provided patient with list for dentist in the area. Educated patient that symptoms will return and may become more complicated if dentist follow up is not obtained. Patient verbally endorsed understanding of plan. I have reviewed the patients home medicines and have made adjustments as needed Patient afebrile with stable vitals.  Provided with return precautions.  Discharged in good condition.  Ddx: these are considered less likely due to history of present illness and physical exam findings -Deep space infection of head/neck: Denies dyspnea, dysphagia, trismus -Sepsis: patient afebrile without tachycardia or hypotension; no leukocytosis -Endocarditis: patient afebrile with stable vitals; patient denies HA, SOB, CP, cough; no skin findings on physical exam   Social Determinants of Health:  none          Final Clinical Impression(s) / ED Diagnoses Final diagnoses:  Pain, dental    Rx / DC Orders ED Discharge Orders          Ordered    amoxicillin-clavulanate (AUGMENTIN) 875-125 MG tablet  Every 12 hours,   Status:  Discontinued        02/04/23 1250    amoxicillin-clavulanate (AUGMENTIN) 875-125 MG tablet  Every 12 hours        02/04/23 1304              Dorthy Cooler, [place] 02/04/23 1326

## 2023-02-04 NOTE — Discharge Instructions (Addendum)
It was a pleasure caring for you today.  I have provided you with a list for dental offices in the area.  I have also personally had good luck with [facility] [phone].  Seek emergency care if experiencing any new or worsening symptoms.  Alternating between 650 mg Tylenol and 400 mg Advil: The best way to alternate taking Acetaminophen (example Tylenol) and Ibuprofen (example Advil/Motrin) is to take them 3 hours apart. For example, if you take ibuprofen at 6 am you can then take Tylenol at 9 am. You can continue this regimen throughout the day, making sure you do not exceed the recommended maximum dose for each drug.

## 2023-02-04 NOTE — ED Notes (Signed)
Patient is awaiting meds from [facility] pharmacy

## 2023-02-04 NOTE — ED Notes (Signed)
TOC meds given to patient. Declined vitals

## 2023-02-04 NOTE — ED Triage Notes (Signed)
Patient reports door hit him the mouth and knocked his tooth loose.  Appears to only have that one tooth that is decayed and hanging on barely.  Complains of pain in his mouth.

## 2023-02-06 NOTE — Telephone Encounter (Signed)
Mailed [place]

## 2023-02-14 ENCOUNTER — Other Ambulatory Visit: Payer: Self-pay

## 2023-02-14 NOTE — Patient Outreach (Signed)
  Medicaid Managed Care Social Work Note  02/14/2023 Name:  Logan Nelson MRN:  [id number] DOB:  1965-07-21  Logan Nelson is an 58 y.o. year old male who is a primary patient of Logan Bleacher, MD.  The Medicaid Managed Care Coordination team was consulted for assistance with:  Community Resources   [name] was given information about Medicaid Managed Care Coordination team services today. Logan Nelson Patient agreed to services and verbal consent obtained.  Engaged with patient  for by telephone forfollow up visit in response to referral for case management and/or care coordination services.   Patient is participating in a Managed Medicaid Plan:  Yes  Assessments/Interventions:  Review of past medical history, allergies, medications, health status, including review of consultants reports, laboratory and other test data, was performed as part of comprehensive evaluation and provision of chronic care management services.  SDOH: (Social Drivers of Health) assessments and interventions performed: SDOH Interventions    Flowsheet Row ED to Hosp-Admission (Discharged) from 06/16/2021 in [facility] 3E HF PCU  SDOH Interventions   Food Insecurity Interventions Intervention Not Indicated  Housing Interventions Intervention Not Indicated  Transportation Interventions Intervention Not Indicated     BSW completed a telephone outreach with patient, he states he is still staying with his niece and in the [place]. Patient states things are going well and would like housing and dental resources resent to address on file.   Advanced Directives Status:  Not addressed in this encounter.  Care Plan                 No Known Allergies  Medications Reviewed Today   Medications were not reviewed in this encounter     Patient Active Problem List   Diagnosis Date Noted   Abscess in epidural space of cervical spine 06/23/2021   Epidural abscess    Acute systolic (congestive)  heart failure (HCC) 06/17/2021   Prolonged QT interval 06/17/2021   MSSA bacteremia    Pressure ulcer of sacral region, stage 3 (HCC)    Acute heart failure with preserved ejection fraction (HFpEF) (HCC) 06/13/2021   Anasarca    Pressure injury of sacral region, unstageable (HCC) 05/25/2021   Pressure injury of buttock, stage 2 (HCC) 05/14/2021   Hypomagnesemia 05/13/2021   Vitamin D  deficiency 05/13/2021   Generalized weakness 05/12/2021   Hypophosphatemia 05/12/2021   Lower extremity weakness 05/11/2021   Hypokalemia 05/11/2021   Anemia of chronic disease 05/11/2021   GERD (gastroesophageal reflux disease) 05/11/2021   Bilateral inguinal hernia 04/23/2021   Protein-calorie malnutrition, severe 04/14/2021   Protein calorie malnutrition (HCC) 04/13/2021   Protein-calorie malnutrition (HCC) 04/12/2021   Hepatic steatosis 04/12/2021   Stroke (HCC) 04/11/2021   Small bowel obstruction (HCC) 10/13/2020   Anemia due to vitamin B12 deficiency 10/13/2020   Hypertension 01/09/2015   Incisional hernia 01/07/2015    Conditions to be addressed/monitored per PCP order:   housing and dental resources  There are no care plans that you recently modified to display for this patient.   Follow up:  Patient agrees to Care Plan and Follow-up.  Plan: The Managed Medicaid care management team will reach out to the patient again over the next 30 days.  Date/time of next scheduled Social Work care management/care coordination outreach:  03/17/23  Logan Nelson, HEDWIG, [facility]  Managed [employer] Social Worker [phone]

## 2023-02-14 NOTE — Patient Instructions (Signed)
Visit Information  Logan Nelson was given information about Medicaid Managed Care team care coordination services as a part of their [name] Community Plan Medicaid benefit. [name] verbally consented to engagement with the [facility] Managed Care team.   If you are experiencing a medical emergency, please call 911 or report to your local emergency department or urgent care.   If you have a non-emergency medical problem during routine business hours, please contact your provider's office and ask to speak with a nurse.   For questions related to your [facility], please call: [phone] or visit the homepage here: [email or web address]  If you would like to schedule transportation through your [facility], please call the following number at least 2 days in advance of your appointment: [phone]   Rides for urgent appointments can also be made after hours by calling Member Services.  Call the Behavioral Health Crisis Line at [phone], at any time, 24 hours a day, 7 days a week. If you are in danger or need immediate medical attention call 911.  If you would like help to quit smoking, call 1-800-QUIT-NOW ((410)353-3156) OR Espaol: 1-855-Djelo-Ya (3-664-403-4742) o para ms informacin haga clic aqu or Text READY to 595-638 to register via text  Mr. [name] - following are the goals we discussed in your visit today:   Goals Addressed   None       Social Worker will follow up in 30 days.   [name], [name], MHA [facility]  Managed Medicaid Social Worker [phone]   Following is a copy of your plan of care:  There are no care plans that you recently modified to display for this patient.

## 2023-02-23 DIAGNOSIS — F331 Major depressive disorder, recurrent, moderate: Secondary | ICD-10-CM | POA: Diagnosis not present

## 2023-02-24 DIAGNOSIS — F331 Major depressive disorder, recurrent, moderate: Secondary | ICD-10-CM | POA: Diagnosis not present

## 2023-02-27 ENCOUNTER — Other Ambulatory Visit: Payer: Self-pay | Admitting: Family Medicine

## 2023-02-27 NOTE — Telephone Encounter (Signed)
Medication Refill -  Most Recent Primary Care Visit:  Provider: [name]  Department[facility]  Visit Type: OFFICE VISIT  Date: 01/09/2023  Medication: oxyCODONE-acetaminophen (PERCOCET/ROXICET) 5-325 MG tablet   Has the patient contacted their pharmacy? No   Is this the correct pharmacy for this prescription? Yes If no, delete pharmacy and type the correct one.  This is the patient's preferred pharmacy:   [id number] [place], [place] - [address] Phone: [phone]  Fax: [phone]      Has the prescription been filled recently? No  Is the patient out of the medication? Yes  Has the patient been seen for an appointment in the last year OR does the patient have an upcoming appointment? Yes  Can we respond through MyChart? No    Please assist patient further

## 2023-02-27 NOTE — Telephone Encounter (Signed)
Requested medication (s) are due for refill today - yes  Requested medication (s) are on the active medication list -yes  Future visit scheduled -yes  Last refill: 01/26/23 #30  Notes to clinic: non delegated Rx  Requested Prescriptions  Pending Prescriptions Disp Refills   oxyCODONE-acetaminophen (PERCOCET/ROXICET) 5-325 MG tablet 30 tablet 0    Sig: Take 1 tablet by mouth 2 (two) times daily as needed for severe pain (pain score 7-10).     Not Delegated - Analgesics:  Opioid Agonist Combinations Failed - 02/27/2023  4:26 PM      Failed - This refill cannot be delegated      Failed - Urine Drug Screen completed in last 360 days      Passed - Valid encounter within last 3 months    Recent Outpatient Visits           1 month ago Chronic pain syndrome   [place] [facility], MD   2 months ago Chronic pain syndrome   [place] [facility], MD   4 months ago Anxiety and depression   [place] [facility], MD   9 months ago Primary hypertension   [place] [facility], MD   10 months ago Primary hypertension   [place] [facility], MD       Future Appointments             In 3 weeks [name], MD [facility] at [facility]               Requested Prescriptions  Pending Prescriptions Disp Refills   oxyCODONE-acetaminophen (PERCOCET/ROXICET) 5-325 MG tablet 30 tablet 0    Sig: Take 1 tablet by mouth 2 (two) times daily as needed for severe pain (pain score 7-10).     Not Delegated - Analgesics:  Opioid Agonist Combinations Failed - 02/27/2023  4:26 PM      Failed - This refill cannot be delegated      Failed - Urine Drug Screen completed in last 360 days      Passed - Valid encounter within last 3 months    Recent Outpatient Visits           1 month ago Chronic  pain syndrome   [place] [facility], MD   2 months ago Chronic pain syndrome   [place] [facility], MD   4 months ago Anxiety and depression   [place] [facility], MD   9 months ago Primary hypertension   [place] [facility], MD   10 months ago Primary hypertension   [place] [facility], MD       Future Appointments             In 3 weeks [name], MD [facility]

## 2023-02-28 ENCOUNTER — Other Ambulatory Visit: Payer: Self-pay | Admitting: Family Medicine

## 2023-02-28 NOTE — Telephone Encounter (Signed)
Requested medication (s) are due for refill today: yes  Requested medication (s) are on the active medication list: yes  Last refill:  01/26/23 #30  Future visit scheduled: yes  Notes to clinic:  med not delegated to NT to RF   Requested Prescriptions  Pending Prescriptions Disp Refills   oxyCODONE-acetaminophen (PERCOCET/ROXICET) 5-325 MG tablet 30 tablet 0    Sig: Take 1 tablet by mouth 2 (two) times daily as needed for severe pain (pain score 7-10).     Not Delegated - Analgesics:  Opioid Agonist Combinations Failed - 02/28/2023  1:46 PM      Failed - This refill cannot be delegated      Failed - Urine Drug Screen completed in last 360 days      Passed - Valid encounter within last 3 months    Recent Outpatient Visits           1 month ago Chronic pain syndrome   [place] [facility], MD   2 months ago Chronic pain syndrome   [place] [facility], MD   4 months ago Anxiety and depression   [place] [facility], MD   10 months ago Primary hypertension   [place] [facility], MD   10 months ago Primary hypertension   [place] [facility], MD       Future Appointments             In 3 weeks [name], MD [facility] at [facility]

## 2023-02-28 NOTE — Telephone Encounter (Signed)
Pt is calling in checking on the status of his oxyCODONE-acetaminophen (PERCOCET/ROXICET) 5-325 MG tablet [[id number]] , pt says he was told by Dr. Andrey Campanile she would put it in on the 18th and it still isn't at the pharmacy. Pt says he is in pain and needs the medication.

## 2023-03-01 ENCOUNTER — Telehealth: Payer: Self-pay | Admitting: Family Medicine

## 2023-03-01 ENCOUNTER — Other Ambulatory Visit: Payer: Self-pay | Admitting: Family Medicine

## 2023-03-01 ENCOUNTER — Ambulatory Visit: Payer: Self-pay | Admitting: Family Medicine

## 2023-03-01 DIAGNOSIS — F331 Major depressive disorder, recurrent, moderate: Secondary | ICD-10-CM | POA: Diagnosis not present

## 2023-03-01 NOTE — Telephone Encounter (Signed)
Copied from CRM [id number]. Topic: Clinical - Medication Refill >> Mar 01, 2023  1:01 PM Payton Doughty wrote: Most Recent Primary Care Visit:  Provider: [name]  [facility]  Visit Type: OFFICE VISIT  Date: 01/09/2023  Medication: oxyCODONE-acetaminophen (PERCOCET/ROXICET) 5-325  Has the patient contacted their pharmacy? Yes (Agent: If no, request that the patient contact the pharmacy for the refill. If patient does not wish to contact the pharmacy document the reason why and proceed with request.) (Agent: If yes, when and what did the pharmacy advise?)  Is this the correct pharmacy for this prescription? No If no, delete pharmacy and type the correct one.  This is the patient's preferred pharmacy:  [facility] [address] [place] - [address] [place] [address] Phone: [phone] Fax: [phone]  [facility] [address] [place] [address] Phone: [phone] Fax: [phone]   Has the prescription been filled recently? Yes  Is the patient out of the medication? Yes  Has the patient been seen for an appointment in the last year OR does the patient have an upcoming appointment? Yes  Can we respond through MyChart? No  Agent: Please be advised that Rx refills may take up to 3 business days. We ask that you follow-up with your pharmacy.

## 2023-03-01 NOTE — Telephone Encounter (Signed)
Copied from CRM [id number]. Topic: Clinical - Red Word Triage >> Mar 01, 2023  3:33 PM Shon Hale wrote: Red Word that prompted transfer to Nurse Triage: Severe pain.  Chief Complaint: check status of refill Symptoms: need refill on pain medication Frequency: n/a Pertinent Negatives: Patient denies n/a Disposition: [] ED /[] Urgent Care (no appt availability in office) / [] Appointment(In office/virtual)/ []  [facility]/ [] Home Care/ [] Refused Recommended Disposition /[]  Mobile Bus/ []  Follow-up with PCP Additional Notes: informed pt that the medication has been requested, it is waiting on PCP approval and it usually takes about 3 days to process.  Informed pt it should be ready soon and would send a message to the PCP office as a follow up.    Answer Assessment - Initial Assessment Questions 1. REASON FOR CALL or QUESTION: "What is your reason for calling today?" or "How can I best help you?" or "What question do you have that I can help answer?"     Patient calling to check on the status of pain medication he requested to refill on Monday.  Patient states it is a delay every month on his medication and he does not know why - states it usually takes about a week after he requests medication for him to get his refill.  Protocols used: Information Only Call - No Triage-A-AH

## 2023-03-01 NOTE — Telephone Encounter (Signed)
I called and spoke to patient.  Patient stated he missed his orthopedic about because he was 15 minutes late so he had to reschedule.

## 2023-03-01 NOTE — Telephone Encounter (Signed)
I sent message to MD Andrey Campanile

## 2023-03-01 NOTE — Telephone Encounter (Signed)
Medication Refill -  Most Recent Primary Care Visit:  Provider: [name]  Department[facility]  Visit Type: OFFICE VISIT  Date: 01/09/2023  Medication: oxyCODONE-acetaminophen (PERCOCET/ROXICET) 5-325 MG tablet   Has the patient contacted their pharmacy? Yes (Agent: If no, request that the patient contact the pharmacy for the refill. If patient does not wish to contact the pharmacy document the reason why and proceed with request.) (Agent: If yes, when and what did the pharmacy advise?)  Is this the correct pharmacy for this prescription? Yes If no, delete pharmacy and type the correct one.  This is the patient's preferred pharmacy:  [facility] [address] [place] - [address] [place] [address] Phone: [phone] Fax: [phone]  Has the prescription been filled recently? Yes  Is the patient out of the medication? Yes   Has the patient been seen for an appointment in the last year OR does the patient have an upcoming appointment? Yes  Can we respond through MyChart? No  Pt is upset he cannot get this refilled in a timely manner.

## 2023-03-02 ENCOUNTER — Other Ambulatory Visit: Payer: Self-pay | Admitting: Family Medicine

## 2023-03-02 DIAGNOSIS — F331 Major depressive disorder, recurrent, moderate: Secondary | ICD-10-CM | POA: Diagnosis not present

## 2023-03-02 MED ORDER — OXYCODONE-ACETAMINOPHEN 5-325 MG PO TABS: 1.0000 | ORAL_TABLET | Freq: Two times a day (BID) | ORAL | 0 refills | Status: AC | PRN

## 2023-03-02 NOTE — Telephone Encounter (Signed)
I called and spoke with patient made him aware that MD Andrey Campanile called in his prescription, however if he missed his orthopedic appointment next month she will not refill it again.

## 2023-03-02 NOTE — Telephone Encounter (Signed)
Copied from CRM [id number]. Topic: Clinical - Medication Refill >> Mar 02, 2023  9:49 AM Hector Shade B wrote: Most Recent Primary Care Visit:  Provider: [name]  [facility]  Visit Type: OFFICE VISIT  Date: 01/09/2023  Medication: oxyCODONE-acetaminophen (PERCOCET/ROXICET) 5-325 MG tablet  Has the patient contacted their pharmacy? No (Agent: If no, request that the patient contact the pharmacy for the refill. If patient does not wish to contact the pharmacy document the reason why and proceed with request.) (Agent: If yes, when and what did the pharmacy advise?)  Is this the correct pharmacy for this prescription? Yes If no, delete pharmacy and type the correct one.  This is the patient's preferred pharmacy:  [facility] [address] [place] - [address] [place] [address] Phone: [phone] Fax: [phone]  [facility] [address] [place] [address] Phone: [phone] Fax: [phone]  [facility] [address] [place] [address] Phone: [phone] Fax: [phone]   Has the prescription been filled recently? Yes  Is the patient out of the medication? Yes  Has the patient been seen for an appointment in the last year OR does the patient have an upcoming appointment? Yes  Can we respond through MyChart? No  Agent: Please be advised that Rx refills may take up to 3 business days. We ask that you follow-up with your pharmacy.   Advised patient to check with pharmacy today being that the prescription was filled he stated he called yesterday.  I advised him of the timeframe and advised him to check with the pharmacy today to check the status but it can take up to 3 business days

## 2023-03-08 DIAGNOSIS — F331 Major depressive disorder, recurrent, moderate: Secondary | ICD-10-CM | POA: Diagnosis not present

## 2023-03-09 DIAGNOSIS — F331 Major depressive disorder, recurrent, moderate: Secondary | ICD-10-CM | POA: Diagnosis not present

## 2023-03-14 ENCOUNTER — Encounter (HOSPITAL_BASED_OUTPATIENT_CLINIC_OR_DEPARTMENT_OTHER): Payer: Self-pay | Admitting: Student

## 2023-03-14 ENCOUNTER — Ambulatory Visit (HOSPITAL_BASED_OUTPATIENT_CLINIC_OR_DEPARTMENT_OTHER): Payer: Medicaid Other

## 2023-03-14 ENCOUNTER — Ambulatory Visit (HOSPITAL_BASED_OUTPATIENT_CLINIC_OR_DEPARTMENT_OTHER): Payer: Medicaid Other | Admitting: Student

## 2023-03-14 DIAGNOSIS — M19011 Primary osteoarthritis, right shoulder: Secondary | ICD-10-CM | POA: Diagnosis not present

## 2023-03-14 DIAGNOSIS — G8929 Other chronic pain: Secondary | ICD-10-CM

## 2023-03-14 DIAGNOSIS — M25511 Pain in right shoulder: Secondary | ICD-10-CM

## 2023-03-14 MED ORDER — LIDOCAINE HCL 1 % IJ SOLN
4.0000 mL | INTRAMUSCULAR | Status: AC | PRN
  Administered 2023-03-14: 4 mL

## 2023-03-14 MED ORDER — TRIAMCINOLONE ACETONIDE 40 MG/ML IJ SUSP
2.0000 mL | INTRAMUSCULAR | Status: AC | PRN
  Administered 2023-03-14: 2 mL via INTRA_ARTICULAR

## 2023-03-14 NOTE — Progress Notes (Signed)
 Chief Complaint: Right shoulder pain     History of Present Illness:   03/14/23: Patient presents today for follow-up of his right shoulder.  He reports that last cortisone injection on 11/01/2022 gave him significant relief but began wearing off about 1 week ago.  He now reports severe pain in the shoulder today.  This bothers him when reaching overhead or during activity such as driving.   Logan Nelson is a 58 y.o. male presenting today for evaluation of his right shoulder as well as possible repeat cortisone injection.  He does have a known history of right shoulder osteoarthritis and last received an injection on 05/20/2022.  He states that while this did not take his pain away completely, it did improve for a few months.  It has now worn off and he rates his shoulder pain as an 8/10.  He is right-hand dominant.  Also reports that his PCP recently referred him to a pain clinic but he has not been seen yet.   Surgical History:   None  PMH/PSH/Family History/Social History/Meds/Allergies:    Past Medical History:  Diagnosis Date   Anemia    Arthritis    Bradycardia    Dyspnea    Falling episodes    GERD (gastroesophageal reflux disease)    Hernia of abdominal wall    Hypertension    Lower extremity edema    Past Surgical History:  Procedure Laterality Date   ABDOMINAL SURGERY     ANTERIOR CERVICAL DECOMP/DISCECTOMY FUSION N/A 06/23/2021   Procedure: Anterior cervical Decompression and epidural Abcess evacuation with arthodesis cervical Four -five;  Surgeon: Gillie Duncans, MD;  [facility];  Service: Neurosurgery;  Laterality: N/A;   INGUINAL HERNIA REPAIR Right 04/30/2021   Procedure: HERNIA REPAIR INGUINAL WITH MESH;  Surgeon: Stechschulte, Deward PARAS, MD;  Location: [facility] OR;  Service: General;  Laterality: Right;   LYSIS OF ADHESION  04/30/2021   Procedure: LYSIS OF ADHESION;  Surgeon: Lyndel Deward PARAS, MD;  Location: [facility] OR;  Service:  General;;   RADIOLOGY WITH ANESTHESIA N/A 06/16/2021   Procedure: MRI CERVICAL THORACIC LUMBER AND SACRUM;  Surgeon: Radiologist, Medication, MD;  Location: [facility] OR;  Service: Radiology;  Laterality: N/A;   RADIOLOGY WITH ANESTHESIA N/A 06/18/2021   Procedure: MRI WITH ANESTHESIA;  Surgeon: Radiologist, Medication, MD;  Location: [facility] OR;  Service: Radiology;  Laterality: N/A;   RADIOLOGY WITH ANESTHESIA N/A 06/20/2021   Procedure: MRI WITH ANESTHESIA;  Surgeon: Radiologist, Medication, MD;  Location: [facility] OR;  Service: Radiology;  Laterality: N/A;   UMBILICAL HERNIA REPAIR N/A 04/30/2021   Procedure: OPEN UMBILICAL AND EPIGASTRIC HERNIA REPAIR;  Surgeon: Lyndel Deward PARAS, MD;  Location: [facility] OR;  Service: General;  Laterality: N/A;   Social History   Socioeconomic History   Marital status: Single    Spouse name: Not on file   Number of children: 1   Years of education: Not on file   Highest education level: High school graduate  Occupational History    Comment: pending disability  Tobacco Use   Smoking status: Some Days    Current packs/day: 0.00    Types: Cigarettes    Last attempt to quit: 04/28/2020    Years since quitting: 2.8   Smokeless tobacco: Never  Vaping Use   Vaping status: Never Used  Substance and Sexual Activity   Alcohol use: Not Currently    Comment: before admission   Drug use: Not Currently    Types: Cocaine    Comment: past history   Sexual activity: Not Currently  Other Topics Concern   Not on file  Social History Narrative   ** Merged History Encounter **       Social Drivers of Health   Financial Resource Strain: Medium Risk (09/16/2022)   Overall Financial Resource Strain (CARDIA)    Difficulty of Paying Living Expenses: Somewhat hard  Food Insecurity: Food Insecurity Present (10/26/2022)   Hunger Vital Sign    Worried About Running Out of Food in the Last Year: Sometimes true    Ran Out of Food in the Last Year: Sometimes true  Transportation Needs: No  Transportation Needs (07/02/2021)   PRAPARE - Administrator, Civil Service (Medical): No    Lack of Transportation (Non-Medical): No  Physical Activity: Not on file  Stress: Not on file  Social Connections: Not on file   History reviewed. No pertinent family history. No Known Allergies Current Outpatient Medications  Medication Sig Dispense Refill   fexofenadine  (ALLEGRA ) 180 MG tablet Take 1 tablet (180 mg total) by mouth daily. 30 tablet 5   fluticasone  (FLONASE ) 50 MCG/ACT nasal spray Place 1 spray into both nostrils daily. 16 g 2   fluticasone -salmeterol (ADVAIR  HFA) 115-21 MCG/ACT inhaler Inhale 2 puffs into the lungs 2 (two) times daily. 1 each 12   losartan  (COZAAR ) 50 MG tablet Take 1 tablet (50 mg total) by mouth daily. 90 tablet 0   metoprolol  succinate (TOPROL -XL) 100 MG 24 hr tablet Take 1 tablet (100 mg total) by mouth daily. Take with or immediately following a meal. 90 tablet 1   oxyCODONE -acetaminophen  (PERCOCET/ROXICET) 5-325 MG tablet Take 1 tablet by mouth 2 (two) times daily as needed for severe pain (pain score 7-10). 30 tablet 0   sertraline  (ZOLOFT ) 25 MG tablet Take 1 tablet (25 mg total) by mouth every morning. 90 tablet 1   spironolactone  (ALDACTONE ) 25 MG tablet Take 1 tablet (25 mg total) by mouth daily. 90 tablet 1   No current facility-administered medications for this visit.   No results found.  Review of Systems:   A ROS was performed including pertinent positives and negatives as documented in the HPI.  Physical Exam :   Constitutional: NAD and appears stated age Neurological: Alert and oriented Psych: Appropriate affect and cooperative There were no vitals taken for this visit.   Comprehensive Musculoskeletal Exam:    Right shoulder active range of motion to 90 degrees forward flexion and abduction, 20 degrees external rotation, and internal rotation to back pocket.  Tenderness to palpation over the anterior glenohumeral joint and  lateral deltoid.  Imaging:   Xray (right shoulder 3 views): Severe glenohumeral osteoarthritis with moderate degenerative changes in the [facility] joint.   I personally reviewed and interpreted the radiographs.  Assessment:   58 y.o. male with advanced right shoulder osteoarthritis.  Last injection over 4 months ago gave him great relief so we will plan to repeat this today.  Injection was performed under ultrasound guidance and he tolerated this procedure well.  Will have him continue following the shoulder as needed.  He does also report development of some pain in his right thumb, so I discussed that if this continues to be bothersome over the coming weeks I would likely see him back for x-ray and further evaluation.  Plan :    -Right shoulder glenohumeral injection performed today -Return to clinic as needed    Procedure Note  Patient: Logan Nelson             Date of Birth: 09-Feb-1965           MRN: [id number]             Visit Date: 03/14/2023  Procedures: Visit Diagnoses:  1. Primary osteoarthritis, right shoulder      Large Joint Inj: R glenohumeral on 03/14/2023 11:10 AM Indications: pain Details: 22 G 1.5 in needle, ultrasound-guided anterior approach Medications: 4 mL lidocaine  1 %; 2 mL triamcinolone  acetonide 40 MG/ML Outcome: tolerated well, no immediate complications Procedure, treatment alternatives, risks and benefits explained, specific risks discussed. Consent was given by the patient. Immediately prior to procedure a time out was called to verify the correct patient, procedure, equipment, support staff and site/side marked as required. Patient was prepped and draped in the usual sterile fashion.      I personally saw and evaluated the patient, and participated in the management and treatment plan.   Leonce Reveal, PA-C Orthopedics

## 2023-03-15 DIAGNOSIS — F331 Major depressive disorder, recurrent, moderate: Secondary | ICD-10-CM | POA: Diagnosis not present

## 2023-03-16 DIAGNOSIS — F331 Major depressive disorder, recurrent, moderate: Secondary | ICD-10-CM | POA: Diagnosis not present

## 2023-03-17 ENCOUNTER — Other Ambulatory Visit: Payer: Self-pay

## 2023-03-17 NOTE — Patient Outreach (Signed)
  Medicaid Managed Care Social Work Note  03/17/2023 Name:  [name] MRN:  [id number] DOB:  Jun 13, 1965  Logan Nelson is an 58 y.o. year old male who is a primary patient of [name], MD.  The [facility] Managed Care Coordination team was consulted for assistance with:   housing  Mr. Nelson was given information about Medicaid Managed Care Coordination team services today. Logan Nelson Patient agreed to services and verbal consent obtained.  Engaged with patient  for by telephone forfollow up visit in response to referral for case management and/or care coordination services.   Patient is participating in a Managed Medicaid Plan:  Yes  Assessments/Interventions:  Review of past medical history, allergies, medications, health status, including review of consultants reports, laboratory and other test data, was performed as part of comprehensive evaluation and provision of chronic care management services.  SDOH: (Social Drivers of Health) assessments and interventions performed: SDOH Interventions    Flowsheet Row ED to Hosp-Admission (Discharged) from 06/16/2021 in [facility] 3E HF PCU  SDOH Interventions   Food Insecurity Interventions Intervention Not Indicated  Housing Interventions Intervention Not Indicated  Transportation Interventions Intervention Not Indicated     BSW completed a telephone outreach with patient, he states he has completed an application for [employer] apartments in [place] and will be dropping off the application today. Patient states he is still in pain and is unable to get a refill until he goes to the orthopedic appointment. Patient states his appt is on the 55 and he will go.   Advanced Directives Status:  Not addressed in this encounter.  Care Plan                 No Known Allergies  Medications Reviewed Today   Medications were not reviewed in this encounter     Patient Active Problem List   Diagnosis Date Noted    Abscess in epidural space of cervical spine 06/23/2021   Epidural abscess    Acute systolic (congestive) heart failure (HCC) 06/17/2021   Prolonged QT interval 06/17/2021   MSSA bacteremia    Pressure ulcer of sacral region, stage 3 (HCC)    Acute heart failure with preserved ejection fraction (HFpEF) (HCC) 06/13/2021   Anasarca    Pressure injury of sacral region, unstageable (HCC) 05/25/2021   Pressure injury of buttock, stage 2 (HCC) 05/14/2021   Hypomagnesemia 05/13/2021   Vitamin D  deficiency 05/13/2021   Generalized weakness 05/12/2021   Hypophosphatemia 05/12/2021   Lower extremity weakness 05/11/2021   Hypokalemia 05/11/2021   Anemia of chronic disease 05/11/2021   GERD (gastroesophageal reflux disease) 05/11/2021   Bilateral inguinal hernia 04/23/2021   Protein-calorie malnutrition, severe 04/14/2021   Protein calorie malnutrition (HCC) 04/13/2021   Protein-calorie malnutrition (HCC) 04/12/2021   Hepatic steatosis 04/12/2021   Stroke (HCC) 04/11/2021   Small bowel obstruction (HCC) 10/13/2020   Anemia due to vitamin B12 deficiency 10/13/2020   Hypertension 01/09/2015   Incisional hernia 01/07/2015    Conditions to be addressed/monitored per PCP order:   housing  There are no care plans that you recently modified to display for this patient.   Follow up:  Patient agrees to Care Plan and Follow-up.  Plan: The Managed Medicaid care management team will reach out to the patient again over the next 30 days.  Date/time of next scheduled Social Work care management/care coordination outreach:  04/14/23  [name], HEDWIG, [facility] Health  Managed [facility] Social Worker [phone]

## 2023-03-17 NOTE — Patient Instructions (Signed)
Visit Information  [name] was given information about Medicaid Managed Care team care coordination services as a part of their [name] Community Plan Medicaid benefit. Logan Nelson verbally consented to engagement with the [facility] Managed Care team.   If you are experiencing a medical emergency, please call 911 or report to your local emergency department or urgent care.   If you have a non-emergency medical problem during routine business hours, please contact your provider's office and ask to speak with a nurse.   For questions related to your [facility], please call: [phone] or visit the homepage here: [email or web address]  If you would like to schedule transportation through your [facility], please call the following number at least 2 days in advance of your appointment: [phone]   Rides for urgent appointments can also be made after hours by calling Member Services.  Call the Behavioral Health Crisis Line at [phone], at any time, 24 hours a day, 7 days a week. If you are in danger or need immediate medical attention call 911.  If you would like help to quit smoking, call 1-800-QUIT-NOW ((410)353-3156) OR Espaol: 1-855-Djelo-Ya (3-664-403-4742) o para ms informacin haga clic aqu or Text READY to 595-638 to register via text  Logan Nelson - following are the goals we discussed in your visit today:   Goals Addressed   None       Social Worker will follow up in 30 days.   [name], [name], MHA [facility] Health  Managed Medicaid Social Worker [phone]   Following is a copy of your plan of care:  There are no care plans that you recently modified to display for this patient.

## 2023-03-21 ENCOUNTER — Emergency Department (HOSPITAL_COMMUNITY)
Admission: EM | Admit: 2023-03-21 | Discharge: 2023-03-21 | Discharge status: Against Medical Advice | Payer: Medicaid Other | Attending: Emergency Medicine | Admitting: Emergency Medicine

## 2023-03-21 ENCOUNTER — Encounter (HOSPITAL_COMMUNITY): Payer: Self-pay

## 2023-03-21 ENCOUNTER — Other Ambulatory Visit: Payer: Self-pay

## 2023-03-21 DIAGNOSIS — R519 Headache, unspecified: Secondary | ICD-10-CM | POA: Diagnosis present

## 2023-03-21 DIAGNOSIS — M791 Myalgia, unspecified site: Secondary | ICD-10-CM | POA: Diagnosis not present

## 2023-03-21 DIAGNOSIS — Z5321 Procedure and treatment not carried out due to patient leaving prior to being seen by health care provider: Secondary | ICD-10-CM | POA: Diagnosis not present

## 2023-03-21 DIAGNOSIS — R059 Cough, unspecified: Secondary | ICD-10-CM | POA: Insufficient documentation

## 2023-03-21 DIAGNOSIS — R0989 Other specified symptoms and signs involving the circulatory and respiratory systems: Secondary | ICD-10-CM | POA: Diagnosis not present

## 2023-03-21 LAB — RESP PANEL BY RT-PCR (RSV, FLU A&B, COVID)  RVPGX2
Influenza A by PCR: POSITIVE — AB
Influenza B by PCR: NEGATIVE
Resp Syncytial Virus by PCR: NEGATIVE
SARS Coronavirus 2 by RT PCR: NEGATIVE

## 2023-03-21 MED ORDER — ACETAMINOPHEN 325 MG PO TABS
650.0000 mg | ORAL_TABLET | Freq: Once | ORAL | Status: AC | PRN
  Administered 2023-03-21: 650 mg via ORAL
  Filled 2023-03-21: qty 2

## 2023-03-21 NOTE — ED Notes (Signed)
Pt eloped from the ED lobby. Called x3 for bed. No response.

## 2023-03-21 NOTE — ED Triage Notes (Signed)
Pt to ED c/o flu symptoms for "a couple days" reports runny nose, headache, body aches, cough.

## 2023-03-22 ENCOUNTER — Encounter: Payer: Self-pay | Admitting: Family Medicine

## 2023-03-22 ENCOUNTER — Ambulatory Visit (INDEPENDENT_AMBULATORY_CARE_PROVIDER_SITE_OTHER): Payer: Medicaid Other | Admitting: Family Medicine

## 2023-03-22 VITALS — BP 149/94 | HR 96 | Temp 98.6°F | Resp 20 | Ht 66.0 in | Wt 205.0 lb

## 2023-03-22 DIAGNOSIS — G894 Chronic pain syndrome: Secondary | ICD-10-CM

## 2023-03-22 DIAGNOSIS — F32A Depression, unspecified: Secondary | ICD-10-CM | POA: Diagnosis not present

## 2023-03-22 DIAGNOSIS — F419 Anxiety disorder, unspecified: Secondary | ICD-10-CM | POA: Diagnosis not present

## 2023-03-22 DIAGNOSIS — I10 Essential (primary) hypertension: Secondary | ICD-10-CM | POA: Diagnosis not present

## 2023-03-22 DIAGNOSIS — F331 Major depressive disorder, recurrent, moderate: Secondary | ICD-10-CM | POA: Diagnosis not present

## 2023-03-22 NOTE — Progress Notes (Unsigned)
Established Patient Office Visit  Subjective    Patient ID: Logan Nelson, male    DOB: 12-Jun-1965  Age: 58 y.o. MRN: [id number]  CC:  Chief Complaint  Patient presents with   Follow-up    HPI Logan Nelson presents for follow up of hypertension and anxiety/depression. Patient reports that he has not been consistent with taking his meds.   Outpatient Encounter Medications as of 03/22/2023  Medication Sig   fexofenadine (ALLEGRA) 180 MG tablet Take 1 tablet (180 mg total) by mouth daily.   fluticasone (FLONASE) 50 MCG/ACT nasal spray Place 1 spray into both nostrils daily.   fluticasone-salmeterol (ADVAIR HFA) 115-21 MCG/ACT inhaler Inhale 2 puffs into the lungs 2 (two) times daily.   losartan (COZAAR) 50 MG tablet Take 1 tablet (50 mg total) by mouth daily.   metoprolol succinate (TOPROL-XL) 100 MG 24 hr tablet Take 1 tablet (100 mg total) by mouth daily. Take with or immediately following a meal.   oxyCODONE-acetaminophen (PERCOCET/ROXICET) 5-325 MG tablet Take 1 tablet by mouth 2 (two) times daily as needed for severe pain (pain score 7-10).   sertraline (ZOLOFT) 25 MG tablet Take 1 tablet (25 mg total) by mouth every morning.   spironolactone (ALDACTONE) 25 MG tablet Take 1 tablet (25 mg total) by mouth daily.   No facility-administered encounter medications on file as of 03/22/2023.    Past Medical History:  Diagnosis Date   Anemia    Arthritis    Bradycardia    Dyspnea    Falling episodes    GERD (gastroesophageal reflux disease)    Hernia of abdominal wall    Hypertension    Lower extremity edema     Past Surgical History:  Procedure Laterality Date   ABDOMINAL SURGERY     ANTERIOR CERVICAL DECOMP/DISCECTOMY FUSION N/A 06/23/2021   Procedure: Anterior cervical Decompression and epidural Abcess evacuation with arthodesis cervical Four -five;  Surgeon: Coletta Memos, MD;  Location: [facility] OR;  Service: Neurosurgery;  Laterality: N/A;   INGUINAL HERNIA REPAIR  Right 04/30/2021   Procedure: HERNIA REPAIR INGUINAL WITH MESH;  Surgeon: Stechschulte, Hyman Hopes, MD;  Location: [facility] OR;  Service: General;  Laterality: Right;   LYSIS OF ADHESION  04/30/2021   Procedure: LYSIS OF ADHESION;  Surgeon: Quentin Ore, MD;  Location: [facility] OR;  Service: General;;   RADIOLOGY WITH ANESTHESIA N/A 06/16/2021   Procedure: MRI CERVICAL THORACIC LUMBER AND SACRUM;  Surgeon: Radiologist, Medication, MD;  Location: [facility] OR;  Service: Radiology;  Laterality: N/A;   RADIOLOGY WITH ANESTHESIA N/A 06/18/2021   Procedure: MRI WITH ANESTHESIA;  Surgeon: Radiologist, Medication, MD;  Location: [facility] OR;  Service: Radiology;  Laterality: N/A;   RADIOLOGY WITH ANESTHESIA N/A 06/20/2021   Procedure: MRI WITH ANESTHESIA;  Surgeon: Radiologist, Medication, MD;  Location: [facility] OR;  Service: Radiology;  Laterality: N/A;   UMBILICAL HERNIA REPAIR N/A 04/30/2021   Procedure: OPEN UMBILICAL AND EPIGASTRIC HERNIA REPAIR;  Surgeon: Quentin Ore, MD;  Location: [facility] OR;  Service: General;  Laterality: N/A;    History reviewed. No pertinent family history.  Social History   Socioeconomic History   Marital status: Single    Spouse name: Not on file   Number of children: 1   Years of education: Not on file   Highest education level: High school graduate  Occupational History    Comment: pending disability  Tobacco Use   Smoking status: Some Days    Current packs/day: 0.00    Types:  Cigarettes    Last attempt to quit: 04/28/2020    Years since quitting: 2.9   Smokeless tobacco: Never  Vaping Use   Vaping status: Never Used  Substance and Sexual Activity   Alcohol use: Not Currently    Comment: before admission   Drug use: Not Currently    Types: Cocaine    Comment: past history   Sexual activity: Not Currently  Other Topics Concern   Not on file  Social History Narrative   ** Merged History Encounter **       Social Drivers of Health   Financial Resource Strain: Medium Risk  (09/16/2022)   Overall Financial Resource Strain (CARDIA)    Difficulty of Paying Living Expenses: Somewhat hard  Food Insecurity: Food Insecurity Present (10/26/2022)   Hunger Vital Sign    Worried About Running Out of Food in the Last Year: Sometimes true    Ran Out of Food in the Last Year: Sometimes true  Transportation Needs: No Transportation Needs (07/02/2021)   PRAPARE - Administrator, Civil Service (Medical): No    Lack of Transportation (Non-Medical): No  Physical Activity: Not on file  Stress: Not on file  Social Connections: Not on file  Intimate Partner Violence: Not At Risk (09/16/2022)   Humiliation, Afraid, Rape, and Kick questionnaire    Fear of Current or Ex-Partner: No    Emotionally Abused: No    Physically Abused: No    Sexually Abused: No    Review of Systems  All other systems reviewed and are negative.       Objective    BP (!) 149/94 (BP Location: Right Arm, Patient Position: Sitting, Cuff Size: Large)   Pulse 96   Temp 98.6 F (37 C) (Oral)   Resp 20   Ht 5\' 6"  (1.676 m)   Wt 205 lb (93 kg)   SpO2 94%   BMI 33.09 kg/m   Physical Exam Vitals and nursing note reviewed.  Constitutional:      General: He is not in acute distress.    Appearance: He is obese.  Cardiovascular:     Rate and Rhythm: Regular rhythm.  Pulmonary:     Effort: Pulmonary effort is normal.     Breath sounds: Normal breath sounds.  Abdominal:     Palpations: Abdomen is soft.     Tenderness: There is no abdominal tenderness.  Musculoskeletal:     Right lower leg: No edema.     Left lower leg: No edema.  Neurological:     General: No focal deficit present.     Mental Status: He is alert and oriented to person, place, and time.         Assessment & Plan:   1. Essential hypertension Slighlty elevated readings. Continue. Discussed compliance  2. Anxiety and depression (Primary) Continue meds  3. Chronic pain syndrome Management as per consultant     Return in about 3 months (around 06/19/2023) for follow up, chronic med issues.   Tommie Raymond, MD

## 2023-03-24 ENCOUNTER — Encounter: Payer: Self-pay | Admitting: Family Medicine

## 2023-03-27 ENCOUNTER — Other Ambulatory Visit: Payer: Self-pay | Admitting: Family Medicine

## 2023-03-27 NOTE — Telephone Encounter (Signed)
 Copied from CRM [id number]. Topic: Clinical - Medication Refill >> Mar 27, 2023 12:55 PM Maxwell Marion wrote: Most Recent Primary Care Visit:  Provider: [name]  Department: [facility]  Visit Type: OFFICE VISIT  Date: 03/22/2023  Medication: oxyCODONE-acetaminophen (PERCOCET/ROXICET) 5-325 MG tablet  Has the patient contacted their pharmacy? No, patient has no refills (Agent: If no, request that the patient contact the pharmacy for the refill. If patient does not wish to contact the pharmacy document the reason why and proceed with request.) (Agent: If yes, when and what did the pharmacy advise?)  Is this the correct pharmacy for this prescription? Yes If no, delete pharmacy and type the correct one.  This is the patient's preferred pharmacy:  [facility] [address] [place] - [address] [place] [address] Phone: [phone] Fax: [phone]    Has the prescription been filled recently? Yes  Is the patient out of the medication? Yes  Has the patient been seen for an appointment in the last year OR does the patient have an upcoming appointment? Yes  Can we respond through MyChart? No  Agent: Please be advised that Rx refills may take up to 3 business days. We ask that you follow-up with your pharmacy.

## 2023-03-28 ENCOUNTER — Other Ambulatory Visit: Payer: Self-pay | Admitting: Family Medicine

## 2023-03-28 NOTE — Telephone Encounter (Signed)
 Copied from CRM [id number]. Topic: Clinical - Medication Refill >> Mar 28, 2023  9:55 AM Payton Doughty wrote: Most Recent Primary Care Visit:  Provider: [name]  Department: [facility]  Visit Type: OFFICE VISIT  Date: 03/22/2023  Medication: oxyCODONE-acetaminophen (PERCOCET/ROXICET) 5-325 MG tablet  Has the patient contacted their pharmacy? Yes (Agent: If no, request that the patient contact the pharmacy for the refill. If patient does not wish to contact the pharmacy document the reason why and proceed with request.) (Agent: If yes, when and what did the pharmacy advise?)  Is this the correct pharmacy for this prescription? Yes If no, delete pharmacy and type the correct one.  This is the patient's preferred pharmacy:  [facility] [address] [place] - [address] [place] [address] Phone: [phone] Fax: [phone]  Has the prescription been filled recently? Yes  Is the patient out of the medication? Yes  Has the patient been seen for an appointment in the last year OR does the patient have an upcoming appointment? Yes  Can we respond through MyChart? No  Agent: Please be advised that Rx refills may take up to 3 business days. We ask that you follow-up with your pharmacy.

## 2023-03-28 NOTE — Telephone Encounter (Signed)
 Requested medication (s) are due for refill today: Yes  Requested medication (s) are on the active medication list: Yes  Last refill:  03/02/23  Future visit scheduled: Yes  Notes to clinic:  Unable to refill per protocol, cannot delegate.      Requested Prescriptions  Pending Prescriptions Disp Refills   oxyCODONE-acetaminophen (PERCOCET/ROXICET) 5-325 MG tablet 30 tablet 0    Sig: Take 1 tablet by mouth 2 (two) times daily as needed for severe pain (pain score 7-10).     Not Delegated - Analgesics:  Opioid Agonist Combinations Failed - 03/28/2023  1:48 PM      Failed - This refill cannot be delegated      Failed - Urine Drug Screen completed in last 360 days      Passed - Valid encounter within last 3 months    Recent Outpatient Visits           6 days ago Anxiety and depression   [place] [facility], MD   2 months ago Chronic pain syndrome   [place] [facility], MD   3 months ago Chronic pain syndrome   [place] [facility], MD   5 months ago Anxiety and depression   [place] [facility] at [facility], MD   10 months ago Primary hypertension   [place] [facility], MD       Future Appointments             In 2 months [name], MD [facility] at [facility]

## 2023-03-29 NOTE — Telephone Encounter (Signed)
 Pt states he went to Ortho on 2/04.  He said he told Dr Andrey Campanile that last week when he had his appt w/ her.

## 2023-03-31 ENCOUNTER — Other Ambulatory Visit: Payer: Self-pay | Admitting: Family Medicine

## 2023-03-31 NOTE — Telephone Encounter (Signed)
 Copied from CRM [id number]. Topic: Clinical - Medication Refill >> Mar 31, 2023  8:11 AM Alessandra Bevels wrote: Most Recent Primary Care Visit:  Provider: [name]  [facility]: [facility]  Visit Type: OFFICE VISIT  Date: 03/22/2023  Medication: Medication oxyCODONE-acetaminophen (PERCOCET/ROXICET)    Has the patient contacted their pharmacy? Yes (Agent: If no, request that the patient contact the pharmacy for the refill. If patient does not wish to contact the pharmacy document the reason why and proceed with request.) (Agent: If yes, when and what did the pharmacy advise?)  Is this the correct pharmacy for this prescription? Yes If no, delete pharmacy and type the correct one.  This is the patient's preferred pharmacy:  [facility] [address] [place] - [address] [place] [address] Phone: [phone] Fax: [phone]   Has the prescription been filled recently? Yes  Is the patient out of the medication? Yes  Has the patient been seen for an appointment in the last year OR does the patient have an upcoming appointment? Yes  Can we respond through MyChart? Yes  Agent: Please be advised that Rx refills may take up to 3 business days. We ask that you follow-up with your pharmacy.

## 2023-03-31 NOTE — Telephone Encounter (Signed)
 Requested medication (s) are due for refill today: yes  Requested medication (s) are on the active medication list: yes  Last refill:  03/02/23 #30/0  Future visit scheduled: yes  Notes to clinic:  Unable to refill per protocol, cannot delegate.      Requested Prescriptions  Pending Prescriptions Disp Refills   oxyCODONE-acetaminophen (PERCOCET/ROXICET) 5-325 MG tablet 30 tablet 0    Sig: Take 1 tablet by mouth 2 (two) times daily as needed for severe pain (pain score 7-10).     Not Delegated - Analgesics:  Opioid Agonist Combinations Failed - 03/31/2023  4:03 PM      Failed - This refill cannot be delegated      Failed - Urine Drug Screen completed in last 360 days      Passed - Valid encounter within last 3 months    Recent Outpatient Visits           1 week ago Anxiety and depression   [place] [facility], MD   2 months ago Chronic pain syndrome   [place] [facility], MD   3 months ago Chronic pain syndrome   [place] [facility], MD   5 months ago Anxiety and depression   [place] [facility], MD   11 months ago Primary hypertension    [facility] at [facility], MD       Future Appointments             In 2 months [name], MD [facility] at [facility]

## 2023-04-03 NOTE — Telephone Encounter (Signed)
 Reason for CRM: PT is completely out of his oxyCODONE-acetaminophen (PERCOCET/ROXICET), he is requesting to have this sent over to his pharmacy by the end of today if possible?

## 2023-04-03 NOTE — Telephone Encounter (Signed)
 Pt following up on request for his oxyCODONE-acetaminophen (PERCOCET/ROXICET) 5-325 MG tablet .  Pt states he is out and needs asap

## 2023-04-04 ENCOUNTER — Ambulatory Visit: Payer: Self-pay | Admitting: Family Medicine

## 2023-04-04 ENCOUNTER — Telehealth: Payer: Self-pay

## 2023-04-04 ENCOUNTER — Telehealth: Payer: Self-pay | Admitting: Family Medicine

## 2023-04-04 NOTE — Telephone Encounter (Signed)
 Copied from CRM [id number]. Topic: Clinical - Red Word Triage >> Apr 04, 2023  1:39 PM [name] wrote: Red Word that prompted transfer to Nurse Triage: Patient states his right thumb is dislocated and it popped 4x yesterday and patient states its severe pain when you pop it back into place. Caller states PCP is aware.

## 2023-04-04 NOTE — Telephone Encounter (Signed)
 Agent from [facility] called with patient asking about oxycodone medication refill. Pt states he has ran out of medication and does not want to go back to the hospital with chronic pain. States that Dr. Andrey Campanile is aware that he takes medication for his pain management.I told [facility] that I will let Dr. Andrey Campanile know. Please advise.

## 2023-04-04 NOTE — Telephone Encounter (Signed)
 error

## 2023-04-06 ENCOUNTER — Telehealth: Payer: Self-pay | Admitting: Student

## 2023-04-06 ENCOUNTER — Telehealth: Payer: Self-pay | Admitting: Family Medicine

## 2023-04-06 NOTE — Telephone Encounter (Signed)
 Spoke with pt concerning his symptoms.  Will follow up on shoulder and thumb Mon Mar 3 at 11am.

## 2023-04-06 NOTE — Telephone Encounter (Signed)
 I called and spoke with patient and made him aware that MD Andrey Campanile stated "Discussed at last OV that patient will no longer be receiving pain meds from me once he is established with ortho

## 2023-04-06 NOTE — Telephone Encounter (Signed)
 Please advise patient.

## 2023-04-06 NOTE — Telephone Encounter (Signed)
 Patient states he was unaware of the message below. Patient states his orthopedic stated pain medication would have to come from his PCP.  Caller would like a follow up call today, stating he has been calling since 03/27/2022

## 2023-04-06 NOTE — Telephone Encounter (Signed)
 Pt requesting a call back concerning his pains. Pt asked for PA [employer] to give him a call back as soon as possible. Pt phone number is [phone].

## 2023-04-10 ENCOUNTER — Other Ambulatory Visit: Payer: Self-pay

## 2023-04-10 ENCOUNTER — Other Ambulatory Visit: Payer: Self-pay | Admitting: Family Medicine

## 2023-04-10 ENCOUNTER — Encounter (HOSPITAL_BASED_OUTPATIENT_CLINIC_OR_DEPARTMENT_OTHER): Payer: Self-pay | Admitting: Student

## 2023-04-10 ENCOUNTER — Ambulatory Visit (HOSPITAL_BASED_OUTPATIENT_CLINIC_OR_DEPARTMENT_OTHER): Payer: Medicaid Other | Admitting: Student

## 2023-04-10 ENCOUNTER — Ambulatory Visit (HOSPITAL_BASED_OUTPATIENT_CLINIC_OR_DEPARTMENT_OTHER)

## 2023-04-10 DIAGNOSIS — M542 Cervicalgia: Secondary | ICD-10-CM

## 2023-04-10 DIAGNOSIS — I10 Essential (primary) hypertension: Secondary | ICD-10-CM

## 2023-04-10 DIAGNOSIS — M65311 Trigger thumb, right thumb: Secondary | ICD-10-CM | POA: Diagnosis not present

## 2023-04-10 MED ORDER — SPIRONOLACTONE 25 MG PO TABS: 25.0000 mg | ORAL_TABLET | Freq: Every day | ORAL | 0 refills | Status: DC

## 2023-04-10 MED ORDER — METOPROLOL SUCCINATE ER 100 MG PO TB24: 100.0000 mg | ORAL_TABLET | Freq: Every day | ORAL | 0 refills | Status: DC

## 2023-04-10 MED ORDER — LIDOCAINE HCL 1 % IJ SOLN
1.0000 mL | INTRAMUSCULAR | Status: AC | PRN
  Administered 2023-04-10: 1 mL

## 2023-04-10 MED ORDER — LOSARTAN POTASSIUM 50 MG PO TABS: 50.0000 mg | ORAL_TABLET | Freq: Every day | ORAL | 0 refills | Status: DC

## 2023-04-10 MED ORDER — TRIAMCINOLONE ACETONIDE 40 MG/ML IJ SUSP
1.0000 mL | INTRAMUSCULAR | Status: AC | PRN
  Administered 2023-04-10: 1 mL

## 2023-04-10 NOTE — Progress Notes (Signed)
 Chief Complaint: Right thumb pain     History of Present Illness:    Logan Nelson is a 58 y.o. right-hand-dominant male here today for evaluation of pain in his right thumb.  States that this began about a year ago with no known injury and has been slowly worsening.  Patient states that his thumb has been locking which is now happening about 4-5 times a day.  Rates his pain level as severe.  Has been taking oxycodone consistently although reports that he is running out of this and has not been set up with pain management.  Does also report some pain in his neck and low back which is radiating into both legs.  He did undergo a C4-C5 anterior cervical decompression in May 2023.   Surgical History:   None  PMH/PSH/Family History/Social History/Meds/Allergies:    Past Medical History:  Diagnosis Date   Anemia    Arthritis    Bradycardia    Dyspnea    Falling episodes    GERD (gastroesophageal reflux disease)    Hernia of abdominal wall    Hypertension    Lower extremity edema    Past Surgical History:  Procedure Laterality Date   ABDOMINAL SURGERY     ANTERIOR CERVICAL DECOMP/DISCECTOMY FUSION N/A 06/23/2021   Procedure: Anterior cervical Decompression and epidural Abcess evacuation with arthodesis cervical Four -five;  Surgeon: Coletta Memos, MD;  Location: [facility] OR;  Service: Neurosurgery;  Laterality: N/A;   INGUINAL HERNIA REPAIR Right 04/30/2021   Procedure: HERNIA REPAIR INGUINAL WITH MESH;  Surgeon: Stechschulte, Hyman Hopes, MD;  Location: [facility] OR;  Service: General;  Laterality: Right;   LYSIS OF ADHESION  04/30/2021   Procedure: LYSIS OF ADHESION;  Surgeon: Quentin Ore, MD;  Location: [facility] OR;  Service: General;;   RADIOLOGY WITH ANESTHESIA N/A 06/16/2021   Procedure: MRI CERVICAL THORACIC LUMBER AND SACRUM;  Surgeon: Radiologist, Medication, MD;  Location: [facility] OR;  Service: Radiology;  Laterality: N/A;   RADIOLOGY WITH ANESTHESIA N/A  06/18/2021   Procedure: MRI WITH ANESTHESIA;  Surgeon: Radiologist, Medication, MD;  Location: [facility] OR;  Service: Radiology;  Laterality: N/A;   RADIOLOGY WITH ANESTHESIA N/A 06/20/2021   Procedure: MRI WITH ANESTHESIA;  Surgeon: Radiologist, Medication, MD;  Location: [facility] OR;  Service: Radiology;  Laterality: N/A;   UMBILICAL HERNIA REPAIR N/A 04/30/2021   Procedure: OPEN UMBILICAL AND EPIGASTRIC HERNIA REPAIR;  Surgeon: Quentin Ore, MD;  Location: [facility] OR;  Service: General;  Laterality: N/A;   Social History   Socioeconomic History   Marital status: Single    Spouse name: Not on file   Number of children: 1   Years of education: Not on file   Highest education level: High school graduate  Occupational History    Comment: pending disability  Tobacco Use   Smoking status: Some Days    Current packs/day: 0.00    Types: Cigarettes    Last attempt to quit: 04/28/2020    Years since quitting: 2.9   Smokeless tobacco: Never  Vaping Use   Vaping status: Never Used  Substance and Sexual Activity   Alcohol use: Not Currently    Comment: before admission   Drug use: Not Currently    Types: Cocaine    Comment: past history   Sexual activity: Not Currently  Other Topics Concern   Not on file  Social History Narrative   ** Merged History Encounter **       Social Drivers of Health   Financial Resource Strain: Medium Risk (09/16/2022)   Overall Financial Resource Strain (CARDIA)    Difficulty of Paying Living Expenses: Somewhat hard  Food Insecurity: Food Insecurity Present (10/26/2022)   Hunger Vital Sign    Worried About Running Out of Food in the Last Year: Sometimes true    Ran Out of Food in the Last Year: Sometimes true  Transportation Needs: No Transportation Needs (07/02/2021)   PRAPARE - Administrator, Civil Service (Medical): No    Lack of Transportation (Non-Medical): No  Physical Activity: Not on file  Stress: Not on file  Social Connections: Not on file    History reviewed. No pertinent family history. No Known Allergies Current Outpatient Medications  Medication Sig Dispense Refill   fexofenadine (ALLEGRA) 180 MG tablet Take 1 tablet (180 mg total) by mouth daily. 30 tablet 5   fluticasone (FLONASE) 50 MCG/ACT nasal spray Place 1 spray into both nostrils daily. 16 g 2   fluticasone-salmeterol (ADVAIR HFA) 115-21 MCG/ACT inhaler Inhale 2 puffs into the lungs 2 (two) times daily. 1 each 12   losartan (COZAAR) 50 MG tablet Take 1 tablet (50 mg total) by mouth daily. 90 tablet 0   metoprolol succinate (TOPROL-XL) 100 MG 24 hr tablet Take 1 tablet (100 mg total) by mouth daily. Take with or immediately following a meal. 90 tablet 1   oxyCODONE-acetaminophen (PERCOCET/ROXICET) 5-325 MG tablet Take 1 tablet by mouth 2 (two) times daily as needed for severe pain (pain score 7-10). 30 tablet 0   sertraline (ZOLOFT) 25 MG tablet Take 1 tablet (25 mg total) by mouth every morning. 90 tablet 1   spironolactone (ALDACTONE) 25 MG tablet Take 1 tablet (25 mg total) by mouth daily. 90 tablet 1   No current facility-administered medications for this visit.   No results found.  Review of Systems:   A ROS was performed including pertinent positives and negatives as documented in the HPI.  Physical Exam :   Constitutional: NAD and appears stated age Neurological: Alert and oriented Psych: Appropriate affect and cooperative There were no vitals taken for this visit.   Comprehensive Musculoskeletal Exam:    Palpable tender nodule of the A1 pulley at the base of the thumb.  Limited IP range of motion with active flexion.  No active triggering with active thumb ROM.  Tenderness with palpation of the first CMC joint.  Radial pulse 2+.  Distal neurosensory exam intact.  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  Negative for acute abnormality   I personally reviewed and interpreted the  radiographs.   Assessment:   58 y.o. male presenting with trigger finger of the right thumb that has been worsening over the last few months.  This is his dominant hand and triggering and pain are affecting his activities of daily living.  Given his symptoms I have offered a trigger finger cortisone injection which he is agreeable to.  Injection was performed in clinic of the right thumb A1 pulley without complication and he tolerated this well.  Patient has also requested refills of his pain medication today which I discussed that I cannot provide however would recommend getting him set up with pain management which he does seem reluctant to.  Given his reported neck and back  pain as well as history of cervical decompression and postop complications, I have recommended he follow-up with his surgeon for further evaluation.  Plan :    -Right trigger thumb cortisone injection performed today -Return to clinic as needed -Referral back to Dr. Franky Macho for follow-up of neck/back pain     Procedure Note  Patient: Mardell Donnelle Olmeda             Date of Birth: 1965-07-15           MRN: [id number]             Visit Date: 04/10/2023  Procedures: Visit Diagnoses:  1. Trigger thumb, right thumb     Hand/UE Inj: R thumb A1 for trigger finger on 04/10/2023 1:19 PM Indications: pain Details: 25 G needle Medications: 1 mL lidocaine 1 %; 1 mL triamcinolone acetonide 40 MG/ML Outcome: tolerated well, no immediate complications Procedure, treatment alternatives, risks and benefits explained, specific risks discussed. Consent was given by the patient. Immediately prior to procedure a time out was called to verify the correct patient, procedure, equipment, support staff and site/side marked as required. Patient was prepped and draped in the usual sterile fashion.      I personally saw and evaluated the patient, and participated in the management and treatment plan.  Hazle Nordmann,  PA-C Orthopedics

## 2023-04-10 NOTE — Patient Outreach (Signed)
 BSW completed a telephone outreach returning patients call, patient states he is in pain and needs pain medication. Patient states he does not have anything because his PCP did not prescribe his pain medication or his blood pressure medication. BSW informed his blood pressure medication was sent in today for a 30 day supply. Patient stated he has some blood pressure medicine but wants the pain medication. He states his pain management clinic stated they would not give him a refill for his pain medication for 2 months. BSW informed patient there was nothing more she could do about the pain medication. Informed if pain is unbearable to go to the ED. Patient became upset and disconnected the call.   Logan Nelson, MHA [facility]  Managed [facility] Social Worker [phone]

## 2023-04-14 ENCOUNTER — Other Ambulatory Visit: Payer: Self-pay

## 2023-04-14 NOTE — Patient Instructions (Signed)
 Visit Information  [name] was given information about Medicaid Managed Care team care coordination services as a part of their [facility] Community Plan Medicaid benefit. Logan Nelson verbally consented to engagement with the [facility] Managed Care team.   If you are experiencing a medical emergency, please call 911 or report to your local emergency department or urgent care.   If you have a non-emergency medical problem during routine business hours, please contact your provider's office and ask to speak with a nurse.   For questions related to your [facility], please call: [phone] or visit the homepage here: [email or web address]  If you would like to schedule transportation through your [facility], please call the following number at least 2 days in advance of your appointment: [phone]   Rides for urgent appointments can also be made after hours by calling Member Services.  Call the Behavioral Health Crisis Line at [phone], at any time, 24 hours a day, 7 days a week. If you are in danger or need immediate medical attention call 911.  If you would like help to quit smoking, call 1-800-QUIT-NOW (609-828-3836) OR Espaol: 1-855-Djelo-Ya (7-253-664-4034) o para ms informacin haga clic aqu or Text READY to 742-595 to register via text  Mr. Dudek - following are the goals we discussed in your visit today:   Goals Addressed   None      Social Worker will follow up .   [name], [name], MHA [facility]  Managed Medicaid Social Worker [phone]   Following is a copy of your plan of care:  There are no care plans that you recently modified to display for this patient.

## 2023-04-14 NOTE — Patient Outreach (Signed)
  Medicaid Managed Care Social Work Note  04/14/2023 Name:  Logan Nelson MRN:  [id number] DOB:  01/21/1966  Logan Nelson is an 58 y.o. year old male who is a primary patient of [name], MD.  The [facility] Managed Care Coordination team was consulted for assistance with:   housing  Mr. Iiams was given information about Medicaid Managed Care Coordination team services today. Logan Nelson Patient agreed to services and verbal consent obtained.  Engaged with patient  for by telephone forfollow up visit in response to referral for case management and/or care coordination services.   Patient is participating in a Managed Medicaid Plan:  Yes  Assessments/Interventions:  Review of past medical history, allergies, medications, health status, including review of consultants reports, laboratory and other test data, was performed as part of comprehensive evaluation and provision of chronic care management services.  SDOH: (Social Drivers of Health) assessments and interventions performed: SDOH Interventions    Flowsheet Row ED to Hosp-Admission (Discharged) from 06/16/2021 in [facility] 3E HF PCU  SDOH Interventions   Food Insecurity Interventions Intervention Not Indicated  Housing Interventions Intervention Not Indicated  Transportation Interventions Intervention Not Indicated     BSW completed a telephone outreach with patient, he states he is still in some pain with his thumb, He did go to the ED but the wait was too long. Patient states he did turn in his application for the apartment but has not heard anything from them yet. No other resources are needed at this time.   Advanced Directives Status:  Not addressed in this encounter.  Care Plan                 No Known Allergies  Medications Reviewed Today   Medications were not reviewed in this encounter     Patient Active Problem List   Diagnosis Date Noted   Abscess in epidural space of cervical  spine 06/23/2021   Epidural abscess    Acute systolic (congestive) heart failure (HCC) 06/17/2021   Prolonged QT interval 06/17/2021   MSSA bacteremia    Pressure ulcer of sacral region, stage 3 (HCC)    Acute heart failure with preserved ejection fraction (HFpEF) (HCC) 06/13/2021   Anasarca    Pressure injury of sacral region, unstageable (HCC) 05/25/2021   Pressure injury of buttock, stage 2 (HCC) 05/14/2021   Hypomagnesemia 05/13/2021   Vitamin D deficiency 05/13/2021   Generalized weakness 05/12/2021   Hypophosphatemia 05/12/2021   Lower extremity weakness 05/11/2021   Hypokalemia 05/11/2021   Anemia of chronic disease 05/11/2021   GERD (gastroesophageal reflux disease) 05/11/2021   Bilateral inguinal hernia 04/23/2021   Protein-calorie malnutrition, severe 04/14/2021   Protein calorie malnutrition (HCC) 04/13/2021   Protein-calorie malnutrition (HCC) 04/12/2021   Hepatic steatosis 04/12/2021   Stroke (HCC) 04/11/2021   Small bowel obstruction (HCC) 10/13/2020   Anemia due to vitamin B12 deficiency 10/13/2020   Hypertension 01/09/2015   Incisional hernia 01/07/2015    Conditions to be addressed/monitored per PCP order:   Housing  There are no care plans that you recently modified to display for this patient.   Follow up:  Patient agrees to Care Plan and Follow-up.  Plan: The Managed Medicaid care management team will reach out to the patient again over the next 60 days.  Date/time of next scheduled Social Work care management/care coordination outreach:  06/14/23  [name], [name], [facility]  Managed [facility] Social Worker [phone]

## 2023-04-17 ENCOUNTER — Telehealth (HOSPITAL_BASED_OUTPATIENT_CLINIC_OR_DEPARTMENT_OTHER): Payer: Self-pay | Admitting: Student

## 2023-04-17 DIAGNOSIS — M65311 Trigger thumb, right thumb: Secondary | ICD-10-CM

## 2023-04-17 NOTE — Telephone Encounter (Signed)
 Patient states that the injection that he received did not work. Please advise best contact for patient [phone]

## 2023-04-17 NOTE — Telephone Encounter (Signed)
 I called patient to advise. Voicemail is full. Will try again.

## 2023-04-18 ENCOUNTER — Other Ambulatory Visit: Payer: Self-pay

## 2023-04-18 NOTE — Telephone Encounter (Signed)
 I called, voicemail is full.

## 2023-04-18 NOTE — Telephone Encounter (Signed)
 I called, voicemail picks up before phone rings, voicemail full.

## 2023-04-19 NOTE — Telephone Encounter (Signed)
 I spoke with patient. Referral entered.  Patient wants you to be aware that shoulder injection helped right thumb pain. I explained that, under normal circumstances, a shoulder injection would not affect trigger thumb. His pain is worse. We discussed referral to Dr. Denese Killings. He agreed.

## 2023-05-01 DIAGNOSIS — Z6832 Body mass index (BMI) 32.0-32.9, adult: Secondary | ICD-10-CM | POA: Diagnosis not present

## 2023-05-01 DIAGNOSIS — M5126 Other intervertebral disc displacement, lumbar region: Secondary | ICD-10-CM | POA: Diagnosis not present

## 2023-05-06 ENCOUNTER — Other Ambulatory Visit: Payer: Self-pay | Admitting: Neurosurgery

## 2023-05-06 DIAGNOSIS — M5126 Other intervertebral disc displacement, lumbar region: Secondary | ICD-10-CM

## 2023-05-14 ENCOUNTER — Ambulatory Visit
Admission: RE | Admit: 2023-05-14 | Discharge: 2023-05-14 | Disposition: A | Discharge status: Home / Self Care | Source: Ambulatory Visit | Attending: Neurosurgery | Admitting: Neurosurgery

## 2023-05-14 DIAGNOSIS — M5126 Other intervertebral disc displacement, lumbar region: Secondary | ICD-10-CM

## 2023-05-14 DIAGNOSIS — M48061 Spinal stenosis, lumbar region without neurogenic claudication: Secondary | ICD-10-CM | POA: Diagnosis not present

## 2023-06-12 ENCOUNTER — Telehealth: Payer: Self-pay | Admitting: Family Medicine

## 2023-06-12 DIAGNOSIS — Z6832 Body mass index (BMI) 32.0-32.9, adult: Secondary | ICD-10-CM | POA: Diagnosis not present

## 2023-06-12 DIAGNOSIS — M48061 Spinal stenosis, lumbar region without neurogenic claudication: Secondary | ICD-10-CM | POA: Diagnosis not present

## 2023-06-12 DIAGNOSIS — M5126 Other intervertebral disc displacement, lumbar region: Secondary | ICD-10-CM | POA: Diagnosis not present

## 2023-06-12 NOTE — Telephone Encounter (Signed)
 Called pt to reschedule due to provider will not be in office on scheduled date. Pt stated "what does he need this appt for if the doctor does not want to listen to him". Pt did not let me speak during the phone call while I was trying to reschedule him. Pt hung up after saying I was not listening to him. Was not able to reschedule pt, and appt has been canceled. Called back to leave vm to let pt know his appt has been canceled and that he can call when he's ready to schedule his appt but his vm box was full and could not leave vm.

## 2023-06-14 ENCOUNTER — Ambulatory Visit: Payer: Self-pay

## 2023-06-15 ENCOUNTER — Telehealth: Payer: Self-pay | Admitting: *Deleted

## 2023-06-15 NOTE — Progress Notes (Signed)
 Complex Care Management Care Guide Note  06/15/2023 Name: Logan Nelson MRN: [id number] DOB: 06/19/1965  Logan Nelson is a 58 y.o. year old male who is a primary care patient of Abraham Abo, MD and is actively engaged with the care management team. I reached out to Monnie Anthony by phone today to assist with re-scheduling  with the BSW.  Follow up plan: Unsuccessful telephone outreach attempt made. A HIPAA compliant phone message was left for the patient providing contact information and requesting a return call.  Barnie Bora  [facility]  Value-Based Care Institute, [facility] Guide  Direct Dial: [phone]  Fax [phone]

## 2023-06-19 ENCOUNTER — Ambulatory Visit: Payer: Medicaid Other | Admitting: Family Medicine

## 2023-06-22 NOTE — Progress Notes (Signed)
 Complex Care Management Care Guide Note  06/22/2023 Name: Logan Nelson MRN: [id number] DOB: Sep 08, 1965  Logan Nelson is a 58 y.o. year old male who is a primary care patient of Logan Abo, MD and is actively engaged with the care management team. I reached out to Logan Nelson by phone today to assist with scheduling  with the RN Case Manager BSW.   Called with patient to find new primary care physician. Patient is scheduled for 09/08/23 at 10:40 AM with [facility]  Address: [address] Logan [place], [place], [place] [address] Phone: [phone].   Patient given Mobile Clinic information as well. May 21 Logan Nelson [address] [phone] to get refills for blood pressure medication. [phone]  Follow up plan: Telephone appointment with complex care management team member scheduled for:  BSW 5/22 and [facility] 5/23    Logan Nelson  [facility]  [facility] Guide  Direct Dial: [phone]  Fax [phone]

## 2023-06-23 ENCOUNTER — Telehealth: Payer: Self-pay

## 2023-06-23 NOTE — Telephone Encounter (Signed)
 Copied from CRM [id number]. Topic: Clinical - Prescription Issue >> Jun 22, 2023  2:37 PM Turkey B wrote: Reason for CRM: caller called in for pt trying to get his meds he is out of,, etoprolol succinate (TOPROL -XL) 100 MG 24 hr tablet, losartan  and spironolactone  (ALDACTONE ) 25 MG tablet. He doesn't want to see Dr Elvan Hamel anymore, and pharmacy wont feel until he sees a dr, soonest appt is Aug 1 at [facility] that caller requested. She is asking for short supply until he sees a different Dr. Please cb pt in regards to this

## 2023-06-29 ENCOUNTER — Telehealth: Payer: Self-pay

## 2023-06-30 ENCOUNTER — Telehealth: Payer: Self-pay

## 2023-07-10 ENCOUNTER — Other Ambulatory Visit: Payer: Self-pay | Admitting: Family Medicine

## 2023-07-10 DIAGNOSIS — I10 Essential (primary) hypertension: Secondary | ICD-10-CM

## 2023-07-10 NOTE — Telephone Encounter (Signed)
 Last Fill: Losartan : 04/10/23          Metoprolol : 04/10/23            Oxycodone : 03/02/23      Sertraline : 10/26/22      Spironolactone : 04/10/23  Last OV: 03/22/23 Next OV: 09/08/23  Routing to provider for review/authorization.   Copied from CRM [id number]. Topic: Clinical - Medication Question >> Jul 10, 2023 10:19 AM Hassie Lint wrote: Reason for CRM: Patient calling for a refill of his medication. He is unable to provide which medications he needs, just states all of them. Asked him to verify the pharmacy, states [facility], asked him which  pharmacy gave him 2 different address says he doesn't know.  Patient would like a call back at [phone]

## 2023-07-11 NOTE — Telephone Encounter (Signed)
 Pt has a TOC appt with another practice. Pt in between providers

## 2023-07-12 ENCOUNTER — Other Ambulatory Visit: Payer: Self-pay | Admitting: Family

## 2023-07-12 DIAGNOSIS — F32A Depression, unspecified: Secondary | ICD-10-CM

## 2023-07-12 DIAGNOSIS — I10 Essential (primary) hypertension: Secondary | ICD-10-CM

## 2023-07-12 MED ORDER — SERTRALINE HCL 25 MG PO TABS: 25.0000 mg | ORAL_TABLET | Freq: Every morning | ORAL | 0 refills | Status: DC

## 2023-07-12 MED ORDER — SPIRONOLACTONE 25 MG PO TABS: 25.0000 mg | ORAL_TABLET | Freq: Every day | ORAL | 0 refills | Status: DC

## 2023-07-12 MED ORDER — LOSARTAN POTASSIUM 50 MG PO TABS: 50.0000 mg | ORAL_TABLET | Freq: Every day | ORAL | 0 refills | Status: DC

## 2023-07-12 MED ORDER — METOPROLOL SUCCINATE ER 100 MG PO TB24: 100.0000 mg | ORAL_TABLET | Freq: Every day | ORAL | 0 refills | Status: DC

## 2023-07-12 NOTE — Telephone Encounter (Signed)
-   Losartan , Metoprolol  Succinate, Spironolactone , and Sertraline  prescribed 07/12/2023.  - Schedule appointment with Abraham Abo, MD for refills of Oxycodone -Acetaminophen .

## 2023-07-18 NOTE — Progress Notes (Signed)
 Patient has had 3 unsuccessful outreaches. 5/7, 5/22, and  5/23 No further outreach attempts will be made at this time. We have been unable to contact the patient to reschedule for complex care management services.   Barnie Bora  [facility]  Value-Based Care Institute, [facility] Guide  Direct Dial: [phone]  Fax [phone]

## 2023-07-19 NOTE — Telephone Encounter (Signed)
-   Losartan , Metoprolol  Succinate, Spironolactone , and Sertraline  prescribed 07/12/2023.  - Schedule appointment with Abraham Abo, MD for refills of Oxycodone -Acetaminophen .

## 2023-08-03 ENCOUNTER — Ambulatory Visit (HOSPITAL_BASED_OUTPATIENT_CLINIC_OR_DEPARTMENT_OTHER): Admitting: Student

## 2023-08-09 ENCOUNTER — Ambulatory Visit (HOSPITAL_BASED_OUTPATIENT_CLINIC_OR_DEPARTMENT_OTHER): Admitting: Student

## 2023-08-14 ENCOUNTER — Encounter (HOSPITAL_BASED_OUTPATIENT_CLINIC_OR_DEPARTMENT_OTHER): Payer: Self-pay | Admitting: Student

## 2023-08-14 ENCOUNTER — Ambulatory Visit (HOSPITAL_BASED_OUTPATIENT_CLINIC_OR_DEPARTMENT_OTHER): Admitting: Student

## 2023-08-14 DIAGNOSIS — M19011 Primary osteoarthritis, right shoulder: Secondary | ICD-10-CM

## 2023-08-14 DIAGNOSIS — M25511 Pain in right shoulder: Secondary | ICD-10-CM | POA: Diagnosis not present

## 2023-08-14 MED ORDER — TRIAMCINOLONE ACETONIDE 40 MG/ML IJ SUSP
2.0000 mL | INTRAMUSCULAR | Status: AC | PRN
  Administered 2023-08-14: 2 mL via INTRA_ARTICULAR

## 2023-08-14 MED ORDER — LIDOCAINE HCL 1 % IJ SOLN
4.0000 mL | INTRAMUSCULAR | Status: AC | PRN
Start: 2023-08-14 — End: 2023-08-14
  Administered 2023-08-14: 4 mL

## 2023-08-14 NOTE — Progress Notes (Signed)
 Chief Complaint: Right shoulder pain     History of Present Illness:   08/14/23: Jerardo presents to clinic today for follow-up evaluation of his right shoulder pain.  He states that his left shoulder has also been bothersome, however not nearly as much as the right.  Last cortisone injection on 2/4 gave him some relief however this is fully worn off.  Pain levels are moderate to severe without use of pain medication.  Pain in the right shoulder does not radiate.   03/14/23: Patient presents today for follow-up of his right shoulder.  He reports that last cortisone injection on 11/01/2022 gave him significant relief but began wearing off about 1 week ago.  He now reports severe pain in the shoulder today.  This bothers him when reaching overhead or during activity such as driving.  Surgical History:   None  PMH/PSH/Family History/Social History/Meds/Allergies:    Past Medical History:  Diagnosis Date   Anemia    Arthritis    Bradycardia    Dyspnea    Falling episodes    GERD (gastroesophageal reflux disease)    Hernia of abdominal wall    Hypertension    Lower extremity edema    Past Surgical History:  Procedure Laterality Date   ABDOMINAL SURGERY     ANTERIOR CERVICAL DECOMP/DISCECTOMY FUSION N/A 06/23/2021   Procedure: Anterior cervical Decompression and epidural Abcess evacuation with arthodesis cervical Four -five;  Surgeon: Gillie Duncans, MD;  Location: [facility] OR;  Service: Neurosurgery;  Laterality: N/A;   INGUINAL HERNIA REPAIR Right 04/30/2021   Procedure: HERNIA REPAIR INGUINAL WITH MESH;  Surgeon: Stechschulte, Deward PARAS, MD;  Location: [facility] OR;  Service: General;  Laterality: Right;   LYSIS OF ADHESION  04/30/2021   Procedure: LYSIS OF ADHESION;  Surgeon: Lyndel Deward PARAS, MD;  Location: [facility] OR;  Service: General;;   RADIOLOGY WITH ANESTHESIA N/A 06/16/2021   Procedure: MRI CERVICAL THORACIC LUMBER AND SACRUM;  Surgeon: Radiologist, Medication,  MD;  Location: [facility] OR;  Service: Radiology;  Laterality: N/A;   RADIOLOGY WITH ANESTHESIA N/A 06/18/2021   Procedure: MRI WITH ANESTHESIA;  Surgeon: Radiologist, Medication, MD;  Location: [facility] OR;  Service: Radiology;  Laterality: N/A;   RADIOLOGY WITH ANESTHESIA N/A 06/20/2021   Procedure: MRI WITH ANESTHESIA;  Surgeon: Radiologist, Medication, MD;  Location: [facility] OR;  Service: Radiology;  Laterality: N/A;   UMBILICAL HERNIA REPAIR N/A 04/30/2021   Procedure: OPEN UMBILICAL AND EPIGASTRIC HERNIA REPAIR;  Surgeon: Lyndel Deward PARAS, MD;  Location: [facility] OR;  Service: General;  Laterality: N/A;   Social History   Socioeconomic History   Marital status: Single    Spouse name: Not on file   Number of children: 1   Years of education: Not on file   Highest education level: High school graduate  Occupational History    Comment: pending disability  Tobacco Use   Smoking status: Some Days    Current packs/day: 0.00    Types: Cigarettes    Last attempt to quit: 04/28/2020    Years since quitting: 3.2   Smokeless tobacco: Never  Vaping Use   Vaping status: Never Used  Substance and Sexual Activity   Alcohol use: Not Currently    Comment: before admission   Drug use: Not Currently    Types: Cocaine    Comment: past  history   Sexual activity: Not Currently  Other Topics Concern   Not on file  Social History Narrative   ** Merged History Encounter **       Social Drivers of Health   Financial Resource Strain: Medium Risk (09/16/2022)   Overall Financial Resource Strain (CARDIA)    Difficulty of Paying Living Expenses: Somewhat hard  Food Insecurity: Food Insecurity Present (10/26/2022)   Hunger Vital Sign    Worried About Running Out of Food in the Last Year: Sometimes true    Ran Out of Food in the Last Year: Sometimes true  Transportation Needs: No Transportation Needs (07/02/2021)   PRAPARE - Administrator, Civil Service (Medical): No    Lack of Transportation  (Non-Medical): No  Physical Activity: Not on file  Stress: Not on file  Social Connections: Not on file   No family history on file. No Known Allergies Current Outpatient Medications  Medication Sig Dispense Refill   fexofenadine  (ALLEGRA ) 180 MG tablet Take 1 tablet (180 mg total) by mouth daily. 30 tablet 5   fluticasone  (FLONASE ) 50 MCG/ACT nasal spray Place 1 spray into both nostrils daily. 16 g 2   fluticasone -salmeterol (ADVAIR HFA) 115-21 MCG/ACT inhaler Inhale 2 puffs into the lungs 2 (two) times daily. 1 each 12   losartan  (COZAAR ) 50 MG tablet Take 1 tablet (50 mg total) by mouth daily. 90 tablet 0   metoprolol  succinate (TOPROL -XL) 100 MG 24 hr tablet Take 1 tablet (100 mg total) by mouth daily. Take with or immediately following a meal. 90 tablet 0   oxyCODONE -acetaminophen  (PERCOCET/ROXICET) 5-325 MG tablet Take 1 tablet by mouth 2 (two) times daily as needed for severe pain (pain score 7-10). 30 tablet 0   sertraline  (ZOLOFT ) 25 MG tablet Take 1 tablet (25 mg total) by mouth every morning. 90 tablet 0   spironolactone  (ALDACTONE ) 25 MG tablet Take 1 tablet (25 mg total) by mouth daily. 90 tablet 0   No current facility-administered medications for this visit.   No results found.  Review of Systems:   A ROS was performed including pertinent positives and negatives as documented in the HPI.  Physical Exam :   Constitutional: NAD and appears stated age Neurological: Alert and oriented Psych: Appropriate affect and cooperative There were no vitals taken for this visit.   Comprehensive Musculoskeletal Exam:    Exam of the right shoulder demonstrates tenderness palpation over the anterior glenohumeral joint.  Active range of motion is to 90 degrees forward flexion and 20 degrees of external rotation.  No overlying erythema or warmth.  Imaging:     Assessment:   58 y.o. male chronic right shoulder pain in the setting of advanced osteoarthritis.  He has been managing this  over the past few years with intermittent cortisone injections which have continued to give him some temporary relief.  Last injection was performed approximately 5 months ago therefore I have offered to repeat this again today.  Patient is agreeable and injection was performed of the right glenohumeral joint today under ultrasound guidance without any complication.  Will plan to see him back as needed.  Plan :    -Right shoulder glenohumeral injection performed today -Return to clinic as needed    Procedure Note  Patient: Logan Nelson             Date of Birth: 1965-09-05           MRN: [id number]  Visit Date: 08/14/2023  Procedures: Visit Diagnoses:  1. Primary osteoarthritis, right shoulder       Large Joint Inj: R glenohumeral on 08/14/2023 11:10 AM Indications: pain Details: 22 G 1.5 in needle, anterior approach Medications: 4 mL lidocaine  1 %; 2 mL triamcinolone  acetonide 40 MG/ML Outcome: tolerated well, no immediate complications Procedure, treatment alternatives, risks and benefits explained, specific risks discussed. Consent was given by the patient. Immediately prior to procedure a time out was called to verify the correct patient, procedure, equipment, support staff and site/side marked as required. Patient was prepped and draped in the usual sterile fashion.      I personally saw and evaluated the patient, and participated in the management and treatment plan.   Leonce Reveal, PA-C Orthopedics

## 2023-09-08 ENCOUNTER — Encounter: Payer: Self-pay | Admitting: Nurse Practitioner

## 2023-09-08 ENCOUNTER — Ambulatory Visit (INDEPENDENT_AMBULATORY_CARE_PROVIDER_SITE_OTHER): Payer: Self-pay | Admitting: Nurse Practitioner

## 2023-09-08 ENCOUNTER — Other Ambulatory Visit (HOSPITAL_COMMUNITY): Payer: Self-pay

## 2023-09-08 VITALS — BP 142/99 | HR 108 | Temp 97.5°F | Wt 205.2 lb

## 2023-09-08 DIAGNOSIS — I10 Essential (primary) hypertension: Secondary | ICD-10-CM

## 2023-09-08 DIAGNOSIS — F32A Depression, unspecified: Secondary | ICD-10-CM | POA: Diagnosis not present

## 2023-09-08 DIAGNOSIS — R0982 Postnasal drip: Secondary | ICD-10-CM

## 2023-09-08 DIAGNOSIS — I5021 Acute systolic (congestive) heart failure: Secondary | ICD-10-CM

## 2023-09-08 DIAGNOSIS — F419 Anxiety disorder, unspecified: Secondary | ICD-10-CM | POA: Diagnosis not present

## 2023-09-08 DIAGNOSIS — R0602 Shortness of breath: Secondary | ICD-10-CM | POA: Diagnosis not present

## 2023-09-08 MED ORDER — LOSARTAN POTASSIUM 50 MG PO TABS
50.0000 mg | ORAL_TABLET | Freq: Every day | ORAL | 0 refills | Status: DC
Start: 2023-09-08 — End: 2023-11-14
  Filled 2023-09-08 – 2023-09-18 (×2): qty 90, 90d supply, fill #0

## 2023-09-08 MED ORDER — SERTRALINE HCL 25 MG PO TABS
25.0000 mg | ORAL_TABLET | Freq: Every morning | ORAL | 0 refills | Status: DC
  Filled 2023-09-08 – 2023-09-18 (×2): qty 90, 90d supply, fill #0

## 2023-09-08 MED ORDER — FLUTICASONE-SALMETEROL 115-21 MCG/ACT IN AERO
2.0000 | INHALATION_SPRAY | Freq: Two times a day (BID) | RESPIRATORY_TRACT | 12 refills | Status: AC
  Filled 2023-09-08: qty 12, 30d supply, fill #0
  Filled 2023-09-08: qty 1, fill #0
  Filled 2023-09-18: qty 12, 30d supply, fill #0
  Filled 2023-12-19 – 2023-12-20 (×2): qty 12, 30d supply, fill #1
  Filled 2024-02-26 – 2024-02-29 (×2): qty 12, 30d supply, fill #2

## 2023-09-08 MED ORDER — METOPROLOL SUCCINATE ER 100 MG PO TB24
100.0000 mg | ORAL_TABLET | Freq: Every day | ORAL | 0 refills | Status: DC
  Filled 2023-09-08 – 2023-09-18 (×2): qty 90, 90d supply, fill #0

## 2023-09-08 MED ORDER — SPIRONOLACTONE 25 MG PO TABS
25.0000 mg | ORAL_TABLET | Freq: Every day | ORAL | 0 refills | Status: DC
  Filled 2023-09-08 – 2023-09-18 (×2): qty 90, 90d supply, fill #0

## 2023-09-08 MED ORDER — FEXOFENADINE HCL 180 MG PO TABS
180.0000 mg | ORAL_TABLET | Freq: Every day | ORAL | 5 refills | Status: AC
  Filled 2023-09-08 – 2023-09-18 (×2): qty 30, 30d supply, fill #0
  Filled 2024-02-26: qty 30, 30d supply, fill #1

## 2023-09-08 MED ORDER — FLUTICASONE PROPIONATE 50 MCG/ACT NA SUSP
1.0000 | Freq: Every day | NASAL | 2 refills | Status: AC
  Filled 2023-09-08: qty 16, 30d supply, fill #0
  Filled 2023-09-18: qty 16, 34d supply, fill #0
  Filled 2024-02-26 – 2024-02-29 (×2): qty 16, 34d supply, fill #1

## 2023-09-08 NOTE — Progress Notes (Signed)
 Subjective   Patient ID: Logan Nelson, male    DOB: 09-Oct-1965, 58 y.o.   MRN: [id number]  Chief Complaint  Patient presents with   Establish Care    Patient stated that he is in pain and need his medications     Referring provider: [name], MD  Logan Nelson is a 58 y.o. male with Past Medical History: No date: Anemia No date: Arthritis No date: Bradycardia No date: Dyspnea No date: Falling episodes No date: GERD (gastroesophageal reflux disease) No date: Hernia of abdominal wall No date: Hypertension No date: Lower extremity edema   HPI  Patient presents today to establish care.  This is a former patient of Dr. Tanda.  Overall he is stable.  He does have a history of congestive heart failure.  We will place a referral for him to cardiology for follow-up.  Patient does need refills on medications today.  He does decline labs today. Denies f/c/s, n/v/d, hemoptysis, PND, leg swelling Denies chest pain or edema     No Known Allergies  Immunization History  Administered Date(s) Administered   Influenza,inj,Quad PF,6+ Mos 01/09/2015   Influenza,inj,Quad PF,6-35 Mos 12/20/2021   Influenza-Unspecified 10/26/2022   Pfizer Fall 2024 Covid-19 Vaccine 6mos thru 20yrs 10/26/2022   Pneumococcal Polysaccharide-23 01/09/2015    Tobacco History: Social History   Tobacco Use  Smoking Status Some Days   Current packs/day: 0.00   Types: Cigarettes   Last attempt to quit: 04/28/2020   Years since quitting: 3.3  Smokeless Tobacco Never   Ready to quit: Not Answered Counseling given: Yes   Outpatient Encounter Medications as of 09/08/2023  Medication Sig   oxyCODONE -acetaminophen  (PERCOCET/ROXICET) 5-325 MG tablet Take 1 tablet by mouth 2 (two) times daily as needed for severe pain (pain score 7-10).   [DISCONTINUED] fexofenadine  (ALLEGRA ) 180 MG tablet Take 1 tablet (180 mg total) by mouth daily.   [DISCONTINUED] fluticasone  (FLONASE ) 50 MCG/ACT nasal  spray Place 1 spray into both nostrils daily.   [DISCONTINUED] fluticasone -salmeterol (ADVAIR  HFA) 115-21 MCG/ACT inhaler Inhale 2 puffs into the lungs 2 (two) times daily.   [DISCONTINUED] losartan  (COZAAR ) 50 MG tablet Take 1 tablet (50 mg total) by mouth daily.   [DISCONTINUED] metoprolol  succinate (TOPROL -XL) 100 MG 24 hr tablet Take 1 tablet (100 mg total) by mouth daily. Take with or immediately following a meal.   [DISCONTINUED] sertraline  (ZOLOFT ) 25 MG tablet Take 1 tablet (25 mg total) by mouth every morning.   [DISCONTINUED] spironolactone  (ALDACTONE ) 25 MG tablet Take 1 tablet (25 mg total) by mouth daily.   fexofenadine  (ALLEGRA ) 180 MG tablet Take 1 tablet (180 mg total) by mouth daily.   fluticasone  (FLONASE ) 50 MCG/ACT nasal spray Place 1 spray into both nostrils daily.   fluticasone -salmeterol (ADVAIR  HFA) 115-21 MCG/ACT inhaler Inhale 2 puffs into the lungs 2 (two) times daily.   losartan  (COZAAR ) 50 MG tablet Take 1 tablet (50 mg total) by mouth daily.   metoprolol  succinate (TOPROL -XL) 100 MG 24 hr tablet Take 1 tablet (100 mg total) by mouth daily. Take with or immediately following a meal.   sertraline  (ZOLOFT ) 25 MG tablet Take 1 tablet (25 mg total) by mouth every morning.   spironolactone  (ALDACTONE ) 25 MG tablet Take 1 tablet (25 mg total) by mouth daily.   No facility-administered encounter medications on file as of 09/08/2023.    Review of Systems  Review of Systems  Constitutional: Negative.   HENT: Negative.    Cardiovascular: Negative.  Gastrointestinal: Negative.   Allergic/Immunologic: Negative.   Neurological: Negative.   Psychiatric/Behavioral: Negative.       Objective:   BP (!) 142/99   Pulse (!) 108   Temp (!) 97.5 F (36.4 C) (Oral)   Wt 205 lb 3.2 oz (93.1 kg)   SpO2 99%   BMI 33.12 kg/m   Wt Readings from Last 5 Encounters:  09/08/23 205 lb 3.2 oz (93.1 kg)  03/22/23 205 lb (93 kg)  02/04/23 218 lb (98.9 kg)  01/09/23 218 lb 6.4 oz  (99.1 kg)  12/20/22 216 lb 12.8 oz (98.3 kg)     Physical Exam Vitals and nursing note reviewed.  Constitutional:      General: He is not in acute distress.    Appearance: He is well-developed.  Cardiovascular:     Rate and Rhythm: Normal rate and regular rhythm.  Pulmonary:     Effort: Pulmonary effort is normal.     Breath sounds: Normal breath sounds.  Skin:    General: Skin is warm and dry.  Neurological:     Mental Status: He is alert and oriented to person, place, and time.       Assessment & Plan:   Acute systolic (congestive) heart failure (HCC) -     Ambulatory referral to Cardiology  Post-nasal drainage -     Fexofenadine  HCl; Take 1 tablet (180 mg total) by mouth daily.  Dispense: 30 tablet; Refill: 5 -     Fluticasone  Propionate; Place 1 spray into both nostrils daily.  Dispense: 16 g; Refill: 2  Shortness of breath -     Fluticasone -Salmeterol; Inhale 2 puffs into the lungs 2 (two) times daily.  Dispense: 1 each; Refill: 12  Primary hypertension -     Losartan  Potassium; Take 1 tablet (50 mg total) by mouth daily.  Dispense: 90 tablet; Refill: 0 -     Metoprolol  Succinate ER; Take 1 tablet (100 mg total) by mouth daily. Take with or immediately following a meal.  Dispense: 90 tablet; Refill: 0 -     Spironolactone ; Take 1 tablet (25 mg total) by mouth daily.  Dispense: 90 tablet; Refill: 0 -     Ambulatory referral to Cardiology  Anxiety and depression -     Sertraline  HCl; Take 1 tablet (25 mg total) by mouth every morning.  Dispense: 90 tablet; Refill: 0     Return in about 3 months (around 12/09/2023).   Bascom GORMAN Borer, NP 09/08/2023

## 2023-09-13 ENCOUNTER — Ambulatory Visit: Payer: Self-pay

## 2023-09-13 NOTE — Telephone Encounter (Signed)
 FYI Only or Action Required?: FYI only for provider.  Patient was last seen in primary care on 09/08/2023 by Oley Bascom RAMAN, NP.  Called Nurse Triage reporting Erectile Dysfunction.  Symptoms began several years ago.  Interventions attempted: Nothing.  Symptoms are: gradually worsening.  Triage Disposition: See PCP When Office is Open (Within 3 Days)  Patient/caregiver understands and will follow disposition?: Yes   Copied from CRM #[id number]. Topic: Clinical - Medical Advice >> Sep 13, 2023 12:09 PM Rosaria BRAVO wrote: Reason for CRM: Pt called requesting to speak to a nurse, says this is private. Did not disclose any further information   Best contact: [phone] Reason for Disposition  All other penis - scrotum symptoms  (Exception: Painless rash < 24 hours duration.)  Answer Assessment - Initial Assessment Questions 1. SYMPTOM: What's the main symptom you're concerned about? (e.g., blood in semen, discharge or pus from penis, itching, pain, rash, swelling)     Problems getting an erection the past couple of times trying   2. ONSET: When did symptoms start?     Getting worse, ongoing a while since having surgery a couple years ago 3. PAIN: Is there any pain? If Yes, ask: How bad is it?  (Scale 1-10; or mild, moderate, severe)     No 5. URINE: Any difficulty passing urine? If Yes, ask: When was the last time?     No 6. OTHER SYMPTOMS: Do you have any other symptoms? (e.g., blood in urine, abdomen pain, fever)     No  Protocols used: Penis and Scrotum Symptoms-A-AH

## 2023-09-18 ENCOUNTER — Other Ambulatory Visit (HOSPITAL_COMMUNITY): Payer: Self-pay

## 2023-09-19 ENCOUNTER — Other Ambulatory Visit (HOSPITAL_COMMUNITY): Payer: Self-pay

## 2023-09-25 ENCOUNTER — Ambulatory Visit: Payer: Self-pay | Admitting: Nurse Practitioner

## 2023-10-26 ENCOUNTER — Emergency Department (HOSPITAL_COMMUNITY)
Admission: EM | Admit: 2023-10-26 | Discharge: 2023-10-26 | Discharge status: Against Medical Advice | Attending: Emergency Medicine | Admitting: Emergency Medicine

## 2023-10-26 DIAGNOSIS — Z5321 Procedure and treatment not carried out due to patient leaving prior to being seen by health care provider: Secondary | ICD-10-CM | POA: Diagnosis not present

## 2023-10-26 DIAGNOSIS — R220 Localized swelling, mass and lump, head: Secondary | ICD-10-CM | POA: Insufficient documentation

## 2023-10-26 NOTE — ED Triage Notes (Signed)
 Pt reports that his eyes are swollen. Pt reports that he has not taken his medications for pain or his blood pressure recently.

## 2023-10-26 NOTE — ED Notes (Signed)
 Pt not answering to multiple calls for triage

## 2023-10-27 ENCOUNTER — Encounter: Payer: Self-pay | Admitting: Nurse Practitioner

## 2023-10-27 ENCOUNTER — Other Ambulatory Visit (HOSPITAL_COMMUNITY): Payer: Self-pay

## 2023-10-27 ENCOUNTER — Ambulatory Visit (INDEPENDENT_AMBULATORY_CARE_PROVIDER_SITE_OTHER): Admitting: Nurse Practitioner

## 2023-10-27 VITALS — BP 147/85 | HR 57 | Wt 209.6 lb

## 2023-10-27 DIAGNOSIS — H5789 Other specified disorders of eye and adnexa: Secondary | ICD-10-CM

## 2023-10-27 MED ORDER — AMOXICILLIN 875 MG PO TABS
875.0000 mg | ORAL_TABLET | Freq: Two times a day (BID) | ORAL | 0 refills | Status: AC
Start: 2023-10-27 — End: 2023-11-23
  Filled 2023-10-27 – 2023-11-13 (×2): qty 20, 10d supply, fill #0

## 2023-10-27 NOTE — Progress Notes (Signed)
 Subjective   Patient ID: Logan Nelson, male    DOB: [birth year], 58 y.o.   MRN: [id number]  Chief Complaint  Patient presents with   Blurred Vision    Blurry vision for 4 days     Referring provider: [name], MD  Logan Nelson is a 58 y.o. male with Past Medical History: No date: Anemia No date: Arthritis No date: Bradycardia No date: Dyspnea No date: Falling episodes No date: GERD (gastroesophageal reflux disease) No date: Hernia of abdominal wall No date: Hypertension No date: Lower extremity edema   HPI  Patient presents today for an acute visit.  He has been having swelling and decreased vision for the past few days.  We will start patient on antibiotic and place referral to optometry.  We discussed that patient can do walk-in visit through [facility]. Denies f/c/s, n/v/d, hemoptysis, PND, leg swelling Denies chest pain or edema     No Known Allergies  Immunization History  Administered Date(s) Administered   Influenza,inj,Quad PF,6+ Mos 01/09/2015   Influenza,inj,Quad PF,6-35 Mos 12/20/2021   Influenza-Unspecified 10/26/2022   Pfizer Fall 2024 Covid-19 Vaccine 6mos thru 56yrs 10/26/2022   Pneumococcal Polysaccharide-23 01/09/2015    Tobacco History: Social History   Tobacco Use  Smoking Status Some Days   Current packs/day: 0.00   Types: Cigarettes   Last attempt to quit: 04/28/2020   Years since quitting: 3.5  Smokeless Tobacco Never   Ready to quit: Not Answered Counseling given: Yes   Outpatient Encounter Medications as of 10/27/2023  Medication Sig   amoxicillin  (AMOXIL ) 875 MG tablet Take 1 tablet (875 mg total) by mouth 2 (two) times daily for 10 days.   fexofenadine  (ALLEGRA ) 180 MG tablet Take 1 tablet (180 mg total) by mouth daily.   fluticasone  (FLONASE ) 50 MCG/ACT nasal spray Place 1 spray into both nostrils daily.   fluticasone -salmeterol (ADVAIR  HFA) 115-21 MCG/ACT inhaler Inhale 2 puffs into the lungs 2 (two)  times daily.   losartan  (COZAAR ) 50 MG tablet Take 1 tablet (50 mg total) by mouth daily.   metoprolol  succinate (TOPROL -XL) 100 MG 24 hr tablet Take 1 tablet (100 mg total) by mouth daily. Take with or immediately following a meal.   oxyCODONE -acetaminophen  (PERCOCET/ROXICET) 5-325 MG tablet Take 1 tablet by mouth 2 (two) times daily as needed for severe pain (pain score 7-10).   sertraline  (ZOLOFT ) 25 MG tablet Take 1 tablet (25 mg total) by mouth every morning.   spironolactone  (ALDACTONE ) 25 MG tablet Take 1 tablet (25 mg total) by mouth daily.   No facility-administered encounter medications on file as of 10/27/2023.    Review of Systems  Review of Systems  Constitutional: Negative.   HENT: Negative.    Cardiovascular: Negative.   Gastrointestinal: Negative.   Allergic/Immunologic: Negative.   Neurological: Negative.   Psychiatric/Behavioral: Negative.       Objective:   BP (!) 147/85   Pulse (!) 57   Wt 209 lb 9.6 oz (95.1 kg)   SpO2 95%   BMI 33.83 kg/m   Wt Readings from Last 5 Encounters:  10/27/23 209 lb 9.6 oz (95.1 kg)  09/08/23 205 lb 3.2 oz (93.1 kg)  03/22/23 205 lb (93 kg)  02/04/23 218 lb (98.9 kg)  01/09/23 218 lb 6.4 oz (99.1 kg)     Physical Exam Vitals and nursing note reviewed.  Constitutional:      General: He is not in acute distress.    Appearance: He is well-developed.  Eyes:     Comments: Swelling noted around eyes  Cardiovascular:     Rate and Rhythm: Normal rate and regular rhythm.  Pulmonary:     Effort: Pulmonary effort is normal.     Breath sounds: Normal breath sounds.  Skin:    General: Skin is warm and dry.  Neurological:     Mental Status: He is alert and oriented to person, place, and time.       Assessment & Plan:   Eye swelling -     Ambulatory referral to Optometry  Other orders -     Amoxicillin ; Take 1 tablet (875 mg total) by mouth 2 (two) times daily for 10 days.  Dispense: 20 tablet; Refill: 0      Return if symptoms worsen or fail to improve.   Bascom GORMAN Borer, NP 11/06/2023

## 2023-10-27 NOTE — Patient Instructions (Signed)
 [facility] (accepts Medicaid, Medicare, [employer], and/or Self-Pay) [facility]  [address], [address],  [place], [place] [address] [phone]  [facility]  [address] [place], [place] [address] [phone]  [facility]  [facility] [place] [address] [place], [place] [address] *Located next to [facility]  [phone]  [facility]  [address] [place], [place] [address]  *Located next to [employer] [phone]  [facility]  [address] [place], [place] [address] [phone]  [facility]  [address] [place], [place] [address]  [phone] *Located inside [facility]  [address] [place], [place] [address] [phone]  [facility] [address] [place], [place] [address] [phone] [address] [place], [place] [address] [phone]  [facility] [address] [place], [place] [address] [phone]  [facility]  [address] [place], [place] [address] [phone]  [facility]  [address] [place], [place] [address] [phone]

## 2023-10-30 ENCOUNTER — Telehealth: Payer: Self-pay

## 2023-10-30 DIAGNOSIS — I5021 Acute systolic (congestive) heart failure: Secondary | ICD-10-CM

## 2023-10-30 DIAGNOSIS — I10 Essential (primary) hypertension: Secondary | ICD-10-CM

## 2023-10-31 ENCOUNTER — Telehealth: Payer: Self-pay | Admitting: *Deleted

## 2023-10-31 NOTE — Progress Notes (Unsigned)
 Complex Care Management Note Care Guide Note  10/31/2023 Name: Logan Nelson MRN: [id number] DOB: 05/29/1965   Complex Care Management Outreach Attempts: An unsuccessful telephone outreach was attempted today to offer the patient information about available complex care management services.  Follow Up Plan:  Additional outreach attempts will be made to offer the patient complex care management information and services.   Encounter Outcome:  No Answer  Harlene Satterfield  [facility] Health  [facility] Guide  Direct Dial: [phone]  Fax [phone]

## 2023-11-01 NOTE — Progress Notes (Unsigned)
 Complex Care Management Note Care Guide Note  11/01/2023 Name: Rex Oesterle MRN: [id number] DOB: 1966-02-03   Complex Care Management Outreach Attempts: A second unsuccessful outreach was attempted today to offer the patient with information about available complex care management services.  Follow Up Plan:  Additional outreach attempts will be made to offer the patient complex care management information and services.   Encounter Outcome:  No Answer  Harlene Satterfield  [facility] Health  [facility] Guide  Direct Dial: [phone]  Fax [phone]

## 2023-11-02 NOTE — Progress Notes (Signed)
 Complex Care Management Note  Care Guide Note 11/02/2023 Name: Logan Nelson MRN: [id number] DOB: 10-29-1965  Logan Nelson is a 58 y.o. year old male who sees No primary care provider on file. for primary care. I reached out to Bennie Francis Louder by phone today to offer complex care management services.  Mr. Boley was given information about Complex Care Management services today including:   The Complex Care Management services include support from the care team which includes your Nurse Care Manager, Clinical Social Worker, or Pharmacist.  The Complex Care Management team is here to help remove barriers to the health concerns and goals most important to you. Complex Care Management services are voluntary, and the patient may decline or stop services at any time by request to their care team member.   Complex Care Management Consent Status: Patient agreed to services and verbal consent obtained.   Follow up plan:  Telephone appointment with complex care management team member scheduled for:  11/10/23 with RNCM and 9/29 with BSW   Encounter Outcome:  Patient Scheduled  Harlene Satterfield  [facility] Health  [facility] Guide  Direct Dial: [phone]  Fax [phone]

## 2023-11-06 ENCOUNTER — Other Ambulatory Visit (HOSPITAL_COMMUNITY): Payer: Self-pay

## 2023-11-06 ENCOUNTER — Encounter: Payer: Self-pay | Admitting: Nurse Practitioner

## 2023-11-06 ENCOUNTER — Other Ambulatory Visit: Payer: Self-pay

## 2023-11-07 ENCOUNTER — Other Ambulatory Visit: Payer: Self-pay

## 2023-11-09 ENCOUNTER — Inpatient Hospital Stay: Payer: Self-pay | Admitting: Nurse Practitioner

## 2023-11-10 ENCOUNTER — Other Ambulatory Visit: Payer: Self-pay | Admitting: *Deleted

## 2023-11-10 ENCOUNTER — Other Ambulatory Visit: Payer: Self-pay

## 2023-11-10 DIAGNOSIS — I10 Essential (primary) hypertension: Secondary | ICD-10-CM

## 2023-11-10 NOTE — Patient Outreach (Addendum)
 Complex Care Management   Visit Note  11/10/2023  Name:  Logan Nelson MRN: [id number] DOB: [birth year]  Situation: Referral received for Complex Care Management related to Heart Failure, SDOH Barriers:  Transportation Housing Insecurity patient is homeless Food insecurity Lack of essential utilities assistance, and HTN I obtained verbal consent from Patient.  Visit completed with Patient  on the phone  Background:   Past Medical History:  Diagnosis Date   Anemia    Arthritis    Bradycardia    Dyspnea    Falling episodes    GERD (gastroesophageal reflux disease)    Hernia of abdominal wall    Hypertension    Lower extremity edema     Assessment: Outreach completed today with Logan Nelson.  Logan Nelson informs me that he is currently homeless and living with friends.  Logan Nelson reports that he was in [facility] a year ago and he reports that he felt like he was in jail at the facility.  He informs me that he left [employer].  He informs me that he was discharged from [facility] to his aunts home and she lost her home and he became homeless.  Medication Review was attempted but patient reports that he does not know what he takes.  Social attempted to outreach patient on 11/06/23 and was unable to complete outreach.  I have rescheduled Social Worker visit for 11/16/23 @ 11 am and patient is agreeable to this date and time.  I have also made referral to pharmacy as patient reports that he is unable to afford him medications.   Patient Reported Symptoms:  Cognitive Cognitive Status: Able to follow simple commands, Alert and oriented to person, place, and time, Normal speech and language skills Cognitive/Intellectual Conditions Management [RPT]: None reported or documented in medical history or problem list   Health Maintenance Behaviors: Annual physical exam, Sleep adequate, Stress management Healing Pattern: Slow Health Facilitated by: Pain control, Healthy diet,  Prayer/meditation, Rest, Stress management  Neurological Neurological Review of Symptoms: Weakness Neurological Management Strategies: Adequate rest, Routine screening, Coping strategies Neurological Self-Management Outcome: 3 (uncertain) Neurological Comment: Patient reports that he has occassional headaches.  Patient has hx of CVA and reports that he has weakness in his lower extremities.  HEENT HEENT Symptoms Reported: No symptoms reported HEENT Management Strategies: Adequate rest, Routine screening HEENT Self-Management Outcome: 4 (good)    Cardiovascular Cardiovascular Symptoms Reported: Swelling in legs or feet Does patient have uncontrolled Hypertension?: Yes Is patient checking Blood Pressure at home?: No Cardiovascular Management Strategies: Adequate rest, Coping strategies, Medication therapy, Routine screening Cardiovascular Self-Management Outcome: 2 (bad)  Respiratory Respiratory Symptoms Reported: Shortness of breath Other Respiratory Symptoms: Patient reports that he has occassional shortness of breath. Respiratory Management Strategies: Adequate rest, Routine screening Respiratory Self-Management Outcome: 3 (uncertain)  Endocrine Endocrine Symptoms Reported: No symptoms reported Is patient diabetic?: No Endocrine Self-Management Outcome: 4 (good)  Gastrointestinal Gastrointestinal Symptoms Reported: No symptoms reported Gastrointestinal Management Strategies: Adequate rest, Coping strategies Gastrointestinal Self-Management Outcome: 4 (good)    Genitourinary Genitourinary Symptoms Reported: No symptoms reported Genitourinary Management Strategies: Adequate rest  Integumentary Integumentary Symptoms Reported: No symptoms reported Skin Management Strategies: Adequate rest Skin Self-Management Outcome: 4 (good)  Musculoskeletal Musculoskelatal Symptoms Reviewed: Difficulty walking, Unsteady gait, Weakness Additional Musculoskeletal Details: Patient reports that he was at  [facility] for a year.  He reports that he left [facility] and should be using medical devices to ambulate with but he did not take any of  the equipment with him.  He reports unsteady gait.  He currently has no DME. Musculoskeletal Management Strategies: Adequate rest, Coping strategies, Routine screening Musculoskeletal Self-Management Outcome: 2 (bad) Musculoskeletal Comment: Patient reports that he has a bad shoulder.  He reports that he receives shots in his arm. Falls in the past year?: Yes Number of falls in past year: 2 or more Was there an injury with Fall?: No Fall Risk Category Calculator: 2 Patient Fall Risk Level: Moderate Fall Risk Patient at Risk for Falls Due to: Impaired balance/gait, Impaired mobility Fall risk Follow up: Falls evaluation completed, Education provided, Falls prevention discussed  Psychosocial Psychosocial Symptoms Reported: No symptoms reported Behavioral Management Strategies: Coping strategies, Adequate rest Major Change/Loss/Stressor/Fears (CP): Denies Quality of Family Relationships: helpful, involved, supportive Do you feel physically threatened by others?: No    11/10/2023    PHQ2-9 Depression Screening   Little interest or pleasure in doing things Not at all  Feeling down, depressed, or hopeless Not at all  PHQ-2 - Total Score 0  Trouble falling or staying asleep, or sleeping too much    Feeling tired or having little energy    Poor appetite or overeating     Feeling bad about yourself - or that you are a failure or have let yourself or your family down    Trouble concentrating on things, such as reading the newspaper or watching television    Moving or speaking so slowly that other people could have noticed.  Or the opposite - being so fidgety or restless that you have been moving around a lot more than usual    Thoughts that you would be better off dead, or hurting yourself in some way    PHQ2-9 Total Score    If you checked off any  problems, how difficult have these problems made it for you to do your work, take care of things at home, or get along with other people    Depression Interventions/Treatment      There were no vitals filed for this visit.  Medications Reviewed Today   Medications were not reviewed in this encounter     Recommendation:   PCP Follow-up Continue Current Plan of Care Follow up with Social Worker and Pharmacist  Follow Up Plan:   Telephone follow-up 2 weeks: 11/29/23 @ 1130 am  Shonnie Poudrier, RN, Scientist, research (physical sciences), Theatre manager [employer] [phone]

## 2023-11-10 NOTE — Patient Instructions (Signed)
 Visit Information  Mr. Kahrs was given information about Medicaid Managed Care team care coordination services as a part of their [facility] Behavioral Health Community Plan Medicaid benefit.   If you would like to schedule transportation through your [facility], please call the following number at least 2 days in advance of your appointment: [phone]   Rides for urgent appointments can also be made after hours by calling Member Services.  Call the Behavioral Health Crisis Line at [phone], at any time, 24 hours a day, 7 days a week. If you are in danger or need immediate medical attention call 911.  Please see education materials related to HTN and CHF provided by MyChart link.  Patient verbalizes understanding of instructions and care plan provided today and agrees to view in MyChart. Active MyChart status and patient understanding of how to access instructions and care plan via MyChart confirmed with patient.     Telephone follow up appointment with Managed Medicaid care management team member scheduled for: 11/16/23 @ 11 am  Lexiana Spindel, Charity fundraiser, Scientist, research (physical sciences), Theatre manager [employer] [phone]

## 2023-11-13 ENCOUNTER — Other Ambulatory Visit (HOSPITAL_COMMUNITY): Payer: Self-pay

## 2023-11-14 ENCOUNTER — Telehealth: Payer: Self-pay

## 2023-11-14 ENCOUNTER — Other Ambulatory Visit (HOSPITAL_COMMUNITY): Payer: Self-pay

## 2023-11-14 DIAGNOSIS — I10 Essential (primary) hypertension: Secondary | ICD-10-CM

## 2023-11-14 DIAGNOSIS — F419 Anxiety disorder, unspecified: Secondary | ICD-10-CM

## 2023-11-14 MED ORDER — SERTRALINE HCL 25 MG PO TABS
25.0000 mg | ORAL_TABLET | Freq: Every morning | ORAL | 0 refills | Status: AC
  Filled 2023-11-14 – 2023-12-20 (×3): qty 90, 90d supply, fill #0

## 2023-11-14 MED ORDER — METOPROLOL SUCCINATE ER 100 MG PO TB24
100.0000 mg | ORAL_TABLET | Freq: Every day | ORAL | 0 refills | Status: AC
  Filled 2023-11-14 – 2023-12-20 (×3): qty 90, 90d supply, fill #0

## 2023-11-14 MED ORDER — SPIRONOLACTONE 25 MG PO TABS
25.0000 mg | ORAL_TABLET | Freq: Every day | ORAL | 0 refills | Status: AC
  Filled 2023-11-14 – 2023-12-20 (×3): qty 90, 90d supply, fill #0

## 2023-11-14 MED ORDER — LOSARTAN POTASSIUM 50 MG PO TABS
50.0000 mg | ORAL_TABLET | Freq: Every day | ORAL | 0 refills | Status: AC
  Filled 2023-11-14 – 2023-12-20 (×3): qty 90, 90d supply, fill #0

## 2023-11-14 NOTE — Telephone Encounter (Signed)
 Was notified by care guide that patient has had difficulty affording meds due to financial barriers. I have forward referral to population health pharmacy technician, Jill Simcox, to educate patient on [facility] Medicaid laws that allow him to obtain medication regardless of his ability to pay. Noted that patient does not have refills of maintenance medications on file at [facility] pharmacy. Will collaborate with PCP to send refills today. Will plan to schedule patient with pharmacy after initial outreach by by [facility].  Noted that patient declined labs at last PCP appt. He has not has BMP since 2024. Must obtain repeat labs at appt on 113/2/5 for continued refills of losartan  and spironolactone .   Lorain Baseman, PharmD [facility] [phone]

## 2023-11-14 NOTE — Patient Outreach (Signed)
 Patient called to reschedule his appointment. New initial appointment was made for 11/22/2023 at 10am.  Orlean Fey, BSW [place]  [facility] Social Worker, [employer] [phone]

## 2023-11-15 ENCOUNTER — Telehealth: Payer: Self-pay | Admitting: Pharmacy Technician

## 2023-11-15 ENCOUNTER — Telehealth (HOSPITAL_COMMUNITY): Payer: Self-pay | Admitting: Pharmacy Technician

## 2023-11-15 ENCOUNTER — Other Ambulatory Visit (HOSPITAL_COMMUNITY): Payer: Self-pay

## 2023-11-15 NOTE — Telephone Encounter (Signed)
 Erroneous Encounter.  Enis Riecke, CPhT [place] [facility] Office: [phone] Email: [email or web address]

## 2023-11-15 NOTE — Progress Notes (Addendum)
   11/15/2023 Name: Logan Nelson MRN: [id number] DOB: 05-Mar-1965  Pharmacist received referral informing that patient has had difficulty affording medications due to financial barriers. Contacted patient today to discuss this referral.   Medication Adherence Barriers Identified:  Patient was informed of the following information provided to me by the pharmacist:  Please educate patient that when he is due for refills of his medications, if he does not have the funds to pay that he should ask the pharmacy at [facility] to put it on a charge account. You can tell him that they will send him a bill in the mail and attempt to collect payment, but he will not be penalized for outstanding charges. This is a benefit of his [place] Medicaid insurance. Some pharmacies will not do this however, but our pharmacies will do it. It looks like he is not due for meds until around Nov 1, so this would just be education for the next time he needs medication. I agree with your plan to outreach tomorrow and again in early Nov, and you can schedule with me after that in first available on [facility] pharm (I think first available are in mid November). Let me know if you have questions or concerns. I am going to work with PCP to send refills to have on file at the pharmacy today. Thank you!   Medication Adherence Barriers Addressed/Actions Taken:  Reviewed medication changes per plan from last clinical pharmacist note Medication Access for Maintenance medications Will discuss medication access concerns with pharmacist Contacted pharmacy regarding  prescriptions Educated patient to contact pharmacy regarding new prescriptions and refills and to mention charge account at that time if funds are not available for him to purchase the medications. Per Pharmacy staff member, Losartan , Metoprolol  ER, Sertraline  and Spironolactone  were all filled for 90 day supplies on 8/11. Advair  and Flonase  were last filled on 8/11 as well. Will  f/u with patient around mid November to check in with him and see if he was able to get his medications.  Reviewed instructions for monitoring blood sugars at home and reminded patient to keep a written log to review with pharmacist Reminded patient of date/time of upcoming clinical pharmacist follow up and any upcoming PCP/specialists visits. Patient denies transportation barriers to the appointment. Yes  Next PCP appointment is scheduled for: 12/11/2023  Kate Caddy, CPhT [facility] Office: [phone] Email: [email or web address]

## 2023-11-16 ENCOUNTER — Other Ambulatory Visit: Payer: Self-pay | Admitting: *Deleted

## 2023-11-16 NOTE — Patient Instructions (Signed)
 Logan Nelson - I am sorry I was unable to reach you today for our scheduled appointment. I work with No primary care provider on file. and am calling to support your healthcare needs.  I have rescheduled your appointment for 12/05/23 @ 1130 am Please contact me at [phone] at your earliest convenience. I look forward to speaking with you soon.   Thank you,  Darl Brisbin, RN, BSN, ACM RN Care Manager [employer] [phone]

## 2023-11-22 ENCOUNTER — Other Ambulatory Visit: Payer: Self-pay

## 2023-11-22 NOTE — Patient Outreach (Signed)
 Complex Care Management   Visit Note  11/22/2023  Name:  Logan Nelson MRN: [id number] DOB: 1965-04-30  Situation: Referral received for Complex Care Management related to SDOH Barriers:  Transportation Housing   Food insecurity Financial Resource Strain I obtained verbal consent from Patient.  Visit completed with Patient  on the phone  Background:   Past Medical History:  Diagnosis Date   Anemia    Arthritis    Bradycardia    Dyspnea    Falling episodes    GERD (gastroesophageal reflux disease)    Hernia of abdominal wall    Hypertension    Lower extremity edema     Assessment:  BSW outreached patient today to assess for SDOH barriers. During the call, BSW assessed that housing, food insecurity, transportation, and financial strain were barriers for this patient. When Patient answered the phone he was hesitant to speak at this time because he was still asleep, BSW offered to reschedule but he said to make the call quick. Patient stated that he has been trying to speak with his case manager for over a year without success. Patient also reported that he currently does not have a primary care provider because his previous providers were not helpful and did not assist him in obtaining needed prescriptions. Patient stated that he is currently staying with a friend and there is no immediate rush for him to leave at this time. He reported needing food assistance and shared that he has visited the emergency department due to lack of food. Patient also stated that he has not been to a dentist or eye doctor within the past year. BSW will provide the patient with a list of resources addressing food insecurity, housing assistance, transportation, financial strain, and affordable dental and vision services that may be covered by Medicaid. BSW instructed patient to call if he has any further questions or needs additional support. BSW will close the case at this time.  [place] [place] [employer] of [employer] ([employer]) Food Not Bombs  [place] Occupational hygienist Food Pantry [employer] Housing Charity fundraiser of [employer] of [place] DSS [place] [employer] of [employer] [employer] Vision Assistance  [employer] Eyeg[employer] [facility]   Recommendation:   No recommendations at this time.  Follow Up Plan:   Patient has met all care management goals. Care Management case will be closed. Patient has been provided contact information should new needs arise.   Orlean Fey, BSW [place]  [facility] Social Worker, [employer] [facility] [phone]

## 2023-11-22 NOTE — Patient Instructions (Signed)
 Visit Information  Thank you for taking time to visit with me today. Please don't hesitate to contact me if I can be of assistance to you before our next scheduled appointment.  Our next appointment is no further scheduled appointments.   Please call the care guide team at [phone] if you need to cancel or reschedule your appointment.   Please call the Suicide and Crisis Lifeline: 988 call the [place]  National Suicide Prevention Lifeline: 920-258-5540 or TTY: 8651411891 TTY (802) 001-7399) to talk to a trained counselor call 1-800-273-TALK (toll free, 24 hour hotline) go to [facility] [address] [place] [phone]) call 911 if you are experiencing a Mental Health or Behavioral Health Crisis or need someone to talk to.  Patient verbalizes understanding of instructions and care plan provided today and agrees to view in MyChart. Active MyChart status and patient understanding of how to access instructions and care plan via MyChart confirmed with patient.     Orlean Fey, BSW [place]  [facility] Institute Social Worker, [employer] [facility] [phone]

## 2023-11-23 ENCOUNTER — Ambulatory Visit (HOSPITAL_BASED_OUTPATIENT_CLINIC_OR_DEPARTMENT_OTHER): Admitting: Student

## 2023-11-23 DIAGNOSIS — M25511 Pain in right shoulder: Secondary | ICD-10-CM | POA: Diagnosis not present

## 2023-11-23 DIAGNOSIS — M19011 Primary osteoarthritis, right shoulder: Secondary | ICD-10-CM | POA: Diagnosis not present

## 2023-11-23 MED ORDER — TRIAMCINOLONE ACETONIDE 40 MG/ML IJ SUSP
2.0000 mL | INTRAMUSCULAR | Status: AC | PRN
  Administered 2023-11-23: 2 mL via INTRA_ARTICULAR

## 2023-11-23 MED ORDER — LIDOCAINE HCL 1 % IJ SOLN
4.0000 mL | INTRAMUSCULAR | Status: AC | PRN
  Administered 2023-11-23: 4 mL

## 2023-11-23 NOTE — Progress Notes (Signed)
 Chief Complaint: Right shoulder pain     History of Present Illness:   11/23/23: Patient presents today for evaluation of his right shoulder.  Today he reports moderate levels of pain that are interfering with his daily activities.  Last injection performed just over 3 months ago has just recently worn off within the last 2 weeks.  He is not taking anything for pain.   7/725: Logan Nelson presents to clinic today for follow-up evaluation of his right shoulder pain.  He states that his left shoulder has also been bothersome, however not nearly as much as the right.  Last cortisone injection on 2/4 gave him some relief however this is fully worn off.  Pain levels are moderate to severe without use of pain medication.  Pain in the right shoulder does not radiate.  Surgical History:   None  PMH/PSH/Family History/Social History/Meds/Allergies:    Past Medical History:  Diagnosis Date   Anemia    Arthritis    Bradycardia    Dyspnea    Falling episodes    GERD (gastroesophageal reflux disease)    Hernia of abdominal wall    Hypertension    Lower extremity edema    Past Surgical History:  Procedure Laterality Date   ABDOMINAL SURGERY     ANTERIOR CERVICAL DECOMP/DISCECTOMY FUSION N/A 06/23/2021   Procedure: Anterior cervical Decompression and epidural Abcess evacuation with arthodesis cervical Four -five;  Surgeon: Gillie Duncans, MD;  Location[facility] OR;  Service: Neurosurgery;  Laterality: N/A;   INGUINAL HERNIA REPAIR Right 04/30/2021   Procedure: HERNIA REPAIR INGUINAL WITH MESH;  Surgeon: Stechschulte, Deward PARAS, MD;  Location: [facility] OR;  Service: General;  Laterality: Right;   LYSIS OF ADHESION  04/30/2021   Procedure: LYSIS OF ADHESION;  Surgeon: Lyndel Deward PARAS, MD;  Location: [facility] OR;  Service: General;;   RADIOLOGY WITH ANESTHESIA N/A 06/16/2021   Procedure: MRI CERVICAL THORACIC LUMBER AND SACRUM;  Surgeon: Radiologist, Medication, MD;  Location: [facility] OR;   Service: Radiology;  Laterality: N/A;   RADIOLOGY WITH ANESTHESIA N/A 06/18/2021   Procedure: MRI WITH ANESTHESIA;  Surgeon: Radiologist, Medication, MD;  Location: [facility] OR;  Service: Radiology;  Laterality: N/A;   RADIOLOGY WITH ANESTHESIA N/A 06/20/2021   Procedure: MRI WITH ANESTHESIA;  Surgeon: Radiologist, Medication, MD;  Location: [facility] OR;  Service: Radiology;  Laterality: N/A;   UMBILICAL HERNIA REPAIR N/A 04/30/2021   Procedure: OPEN UMBILICAL AND EPIGASTRIC HERNIA REPAIR;  Surgeon: Lyndel Deward PARAS, MD;  Location: [facility] OR;  Service: General;  Laterality: N/A;   Social History   Socioeconomic History   Marital status: Single    Spouse name: Not on file   Number of children: 1   Years of education: Not on file   Highest education level: High school graduate  Occupational History    Comment: pending disability  Tobacco Use   Smoking status: Some Days    Current packs/day: 0.00    Types: Cigarettes    Last attempt to quit: 04/28/2020    Years since quitting: 3.5   Smokeless tobacco: Never  Vaping Use   Vaping status: Never Used  Substance and Sexual Activity   Alcohol use: Not Currently    Comment: before admission   Drug use: Not Currently    Types: Cocaine    Comment: past history  Sexual activity: Not Currently  Other Topics Concern   Not on file  Social History Narrative   ** Merged History Encounter **       Social Drivers of Health   Financial Resource Strain: High Risk (11/22/2023)   Overall Financial Resource Strain (CARDIA)    Difficulty of Paying Living Expenses: Very hard  Food Insecurity: Food Insecurity Present (11/22/2023)   Hunger Vital Sign    Worried About Programme researcher, broadcasting/film/video in the Last Year: Often true    Ran Out of Food in the Last Year: Often true  Transportation Needs: Unmet Transportation Needs (11/22/2023)   PRAPARE - Administrator, Civil Service (Medical): Yes    Lack of Transportation (Non-Medical): Yes  Physical Activity:  Not on file  Stress: Not on file  Social Connections: Not on file   No family history on file. No Known Allergies Current Outpatient Medications  Medication Sig Dispense Refill   amoxicillin  (AMOXIL ) 875 MG tablet Take 1 tablet (875 mg total) by mouth 2 (two) times daily for 10 days. (Patient not taking: Reported on 11/23/2023) 20 tablet 0   fexofenadine  (ALLEGRA ) 180 MG tablet Take 1 tablet (180 mg total) by mouth daily. 30 tablet 5   fluticasone  (FLONASE ) 50 MCG/ACT nasal spray Place 1 spray into both nostrils daily. 16 g 2   fluticasone -salmeterol (ADVAIR  HFA) 115-21 MCG/ACT inhaler Inhale 2 puffs into the lungs 2 (two) times daily. 12 g 12   losartan  (COZAAR ) 50 MG tablet Take 1 tablet (50 mg total) by mouth daily. 90 tablet 0   metoprolol  succinate (TOPROL -XL) 100 MG 24 hr tablet Take 1 tablet (100 mg total) by mouth daily. Take with or immediately following a meal. 90 tablet 0   oxyCODONE -acetaminophen  (PERCOCET/ROXICET) 5-325 MG tablet Take 1 tablet by mouth 2 (two) times daily as needed for severe pain (pain score 7-10). 30 tablet 0   sertraline  (ZOLOFT ) 25 MG tablet Take 1 tablet (25 mg total) by mouth every morning. 90 tablet 0   spironolactone  (ALDACTONE ) 25 MG tablet Take 1 tablet (25 mg total) by mouth daily. 90 tablet 0   No current facility-administered medications for this visit.   No results found.  Review of Systems:   A ROS was performed including pertinent positives and negatives as documented in the HPI.  Physical Exam :   Constitutional: NAD and appears stated age Neurological: Alert and oriented Psych: Appropriate affect and cooperative There were no vitals taken for this visit.   Comprehensive Musculoskeletal Exam:    Active right shoulder range of motion is to 90 degrees forward flexion and abduction.  Tenderness over the anterior glenohumeral joint.  No notable erythema or warmth present.  Distal neurosensory exam is intact.  Imaging:     Assessment:    58 y.o. male with complaint of chronic anterior right shoulder pain in the setting of advanced osteoarthritis.  He has been managing with cortisone injections as he has been previously not recommended for surgical intervention.  He is able to actively elevate shoulder to approximately 90 degrees.  Last cortisone injection was given over 3 months ago and this did provide him some good relief so we will plan to repeat injection today.  Patient is agreeable and injection was performed in the right glenohumeral joint under ultrasound guidance.  Will plan to have him follow-up if symptoms recur or worsen.   Plan :    -Right shoulder glenohumeral injection performed today -Return to clinic as needed  Procedure Note  Patient: Logan Nelson             Date of Birth: 1965/06/07           MRN: [id number]             Visit Date: 11/23/2023  Procedures: Visit Diagnoses:  1. Primary osteoarthritis, right shoulder      Large Joint Inj: R glenohumeral on 11/23/2023 11:04 AM Indications: pain Details: 22 G 1.5 in needle, anterior approach Medications: 4 mL lidocaine  1 %; 2 mL triamcinolone  acetonide 40 MG/ML Outcome: tolerated well, no immediate complications Procedure, treatment alternatives, risks and benefits explained, specific risks discussed. Consent was given by the patient. Immediately prior to procedure a time out was called to verify the correct patient, procedure, equipment, support staff and site/side marked as required. Patient was prepped and draped in the usual sterile fashion.      I personally saw and evaluated the patient, and participated in the management and treatment plan.   Leonce Reveal, PA-C Orthopedics

## 2023-11-29 ENCOUNTER — Other Ambulatory Visit: Payer: Self-pay | Admitting: *Deleted

## 2023-11-29 NOTE — Patient Instructions (Signed)
 Logan Nelson - I am sorry I was unable to reach you today for our scheduled appointment. I work with Pcp, No and am calling to support your healthcare needs.   I have rescheduled your appointment for  12/25/23 @ 11 am. Please contact me at [phone] should you need to reschedule your appointment.  I look forward to speaking with you soon.   Thank you,  Jossalin Chervenak, RN, BSN, ACM RN Care Manager [employer] [phone]

## 2023-12-05 ENCOUNTER — Other Ambulatory Visit: Payer: Self-pay

## 2023-12-05 ENCOUNTER — Other Ambulatory Visit: Payer: Self-pay | Admitting: *Deleted

## 2023-12-05 MED ORDER — OXYCODONE HCL 5 MG PO TABS
5.0000 mg | ORAL_TABLET | Freq: Four times a day (QID) | ORAL | 0 refills | Status: AC | PRN
  Filled 2023-12-05: qty 20, 5d supply, fill #0
  Filled 2023-12-05: qty 56, 14d supply, fill #0

## 2023-12-05 NOTE — Patient Outreach (Signed)
 Complex Care Management   Visit Note  12/05/2023  Name:  Logan Nelson MRN: [id number] DOB: 09/27/1965  Situation: Referral received for Complex Care Management related to Heart Failure and HTN I obtained verbal consent from Patient.  Visit completed with Patient  on the phone  Background:   Past Medical History:  Diagnosis Date   Anemia    Arthritis    Bradycardia    Dyspnea    Falling episodes    GERD (gastroesophageal reflux disease)    Hernia of abdominal wall    Hypertension    Lower extremity edema     Assessment: Patient Reported Symptoms:  Cognitive Cognitive Status: Able to follow simple commands, Alert and oriented to person, place, and time, Normal speech and language skills   Health Maintenance Behaviors: Annual physical exam Healing Pattern: Slow  Neurological Neurological Review of Symptoms: Weakness Neurological Management Strategies: Adequate rest, Routine screening, Coping strategies Neurological Comment: Occassional headaches but resolved with ongoing medications.  HEENT HEENT Symptoms Reported: No symptoms reported HEENT Management Strategies: Routine screening    Cardiovascular Cardiovascular Symptoms Reported: Swelling in legs or feet Does patient have uncontrolled Hypertension?: Yes Is patient checking Blood Pressure at home?: No Patient's Recent BP reading at home: Last reported BP 10/27/2023-147/85 Cardiovascular Management Strategies: Routine screening, Medication therapy, Adequate rest  Respiratory Respiratory Symptoms Reported: Shortness of breath Other Respiratory Symptoms: Occassional SOB with good recovery Respiratory Management Strategies: Adequate rest, Routine screening  Endocrine Endocrine Symptoms Reported: No symptoms reported Is patient diabetic?: No    Gastrointestinal Gastrointestinal Symptoms Reported: No symptoms reported Gastrointestinal Management Strategies: Adequate rest, Coping strategies    Genitourinary  Genitourinary Symptoms Reported: No symptoms reported    Integumentary Integumentary Symptoms Reported: No symptoms reported Skin Management Strategies: Adequate rest  Musculoskeletal Musculoskelatal Symptoms Reviewed: Unsteady gait Musculoskeletal Management Strategies: Adequate rest, Coping strategies, Routine screening Musculoskeletal Comment: Shoulder issues recieves every 2 months steriod shot reported by patient      Psychosocial Psychosocial Symptoms Reported: No symptoms reported Behavioral Management Strategies: Coping strategies Major Change/Loss/Stressor/Fears (CP): Denies       There were no vitals filed for this visit.  Medications Reviewed Today     Reviewed by Alvia Olam BIRCH, RN (Registered Nurse) on 12/05/23 at 1148  Med List Status: <None>   Medication Order Taking? Sig Documenting Provider Last Dose Status Informant  fexofenadine  (ALLEGRA ) 180 MG tablet [id number] Yes Take 1 tablet (180 mg total) by mouth daily. Nichols, Tonya S, NP  Active   fluticasone  (FLONASE ) 50 MCG/ACT nasal spray [id number] Yes Place 1 spray into both nostrils daily. Oley Bascom RAMAN, NP  Active   fluticasone -salmeterol (ADVAIR  HFA) 115-21 MCG/ACT inhaler [id number] Yes Inhale 2 puffs into the lungs 2 (two) times daily. Oley Bascom RAMAN, NP  Active   losartan  (COZAAR ) 50 MG tablet [id number] Yes Take 1 tablet (50 mg total) by mouth daily. Nichols, Tonya S, NP  Active   metoprolol  succinate (TOPROL -XL) 100 MG 24 hr tablet [id number] Yes Take 1 tablet (100 mg total) by mouth daily. Take with or immediately following a meal. Oley Bascom RAMAN, NP  Active   oxyCODONE  (OXY IR/ROXICODONE ) 5 MG immediate release tablet [id number] Yes Take 1 tablet (5 mg total) by mouth every 6 (six) hours as needed.   Active   oxyCODONE -acetaminophen  (PERCOCET/ROXICET) 5-325 MG tablet [id number]  Take 1 tablet by mouth 2 (two) times daily as needed for severe pain (pain score 7-10). [name], MD  Active   sertraline   (ZOLOFT )  25 MG tablet [id number] Yes Take 1 tablet (25 mg total) by mouth every morning. Oley Bascom RAMAN, NP  Active   spironolactone  (ALDACTONE ) 25 MG tablet [id number] Yes Take 1 tablet (25 mg total) by mouth daily. Oley Bascom RAMAN, NP  Active             Recommendation:   PCP Follow-up Continue Current Plan of Care  Follow Up Plan:   Telephone follow up appointment date/time:  12/25/2023 @ 11:00 am   Olam Ku, RN, BSN [place]  [facility], [facility] RN Care Manager Direct Dial: [phone]  Fax: [phone]

## 2023-12-05 NOTE — Patient Instructions (Signed)
 Visit Information  Mr. Lewelling was given information about Medicaid Managed Care team care coordination services as a part of their [facility] Medicaid benefit.   If you would like to schedule transportation through your [facility], please call the following number at least 2 days in advance of your appointment: [phone]   Rides for urgent appointments can also be made after hours by calling Member Services.  Call the Behavioral Health Crisis Line at [phone], at any time, 24 hours a day, 7 days a week. If you are in danger or need immediate medical attention call 911.  Please see education materials related to HF provided declined mail out will be moving soon  Care plan and visit instructions communicated with the patient verbally today. The patient DECLINED to receive copy of care plan and patient instructions in any format.   Telephone follow up appointment with Managed Medicaid care management team member scheduled for:   Olam Ku, RN, BSN [place]  [facility], [facility] RN Care Manager Direct Dial: [phone]  Fax: [phone]

## 2023-12-11 ENCOUNTER — Ambulatory Visit: Payer: Self-pay | Admitting: Nurse Practitioner

## 2023-12-11 ENCOUNTER — Other Ambulatory Visit: Payer: Self-pay

## 2023-12-11 MED ORDER — OXYCODONE HCL 5 MG PO TABS
5.0000 mg | ORAL_TABLET | Freq: Four times a day (QID) | ORAL | 0 refills | Status: AC | PRN
  Filled 2023-12-11 – 2023-12-22 (×3): qty 20, 5d supply, fill #0

## 2023-12-19 ENCOUNTER — Other Ambulatory Visit: Payer: Self-pay

## 2023-12-19 ENCOUNTER — Telehealth: Payer: Self-pay | Admitting: Pharmacy Technician

## 2023-12-19 ENCOUNTER — Other Ambulatory Visit (HOSPITAL_COMMUNITY): Payer: Self-pay

## 2023-12-19 NOTE — Progress Notes (Signed)
   12/19/2023 Name: Logan Nelson MRN: [id number] DOB: 10-Mar-1965  Patient is appearing for a follow-up visit with the population health pharmacy technician. Last engaged with the clinical pharmacist to discuss medication adherence and medication access on 11/14/2023. Contacted patient today to discuss medication adherence and medication access.   Plan from last clinical pharmacist appointment:  Was notified by care guide that patient has had difficulty affording meds due to financial barriers. I have forward referral to population health pharmacy technician, Laquanda Bick, to educate patient on [facility] Medicaid laws that allow him to obtain medication regardless of his ability to pay. Noted that patient does not have refills of maintenance medications on file at [facility] pharmacy. Will collaborate with PCP to send refills today. Will plan to schedule patient with pharmacy after initial outreach by by [facility]. Noted that patient declined labs at last PCP appt. He has not has BMP since 2024. Must obtain repeat labs at appt on 113/2/5 for continued refills of losartan  and spironolactone .(copy/paste from last note)   Medication Adherence Barriers Identified:   Access issues with any new medication or testing device: Yes All maintenance meds including but not limited to the following-Advair , Losartan , Metoprolol ,Spironolactone  and Sertraline   Medication Adherence Barriers Addressed/Actions Taken:  Reviewed medication changes per plan from last clinical pharmacist note Medication Access for As listed above Will discuss medication access concerns with pharmacist Contacted pharmacy regarding new prescriptions Spoke to pharmacy staff and requested the aforementioned medications be refilled for the patient. Inquired if patient could set up an A/R account if he was unable to afford his Medicaid copay. Pharmacy staff informed patient would have to speak to a Pharmacist when he comes to pick up the  medications.  Outreached patient to discuss medication management. Unable to leave a voicemail for patient as patient's voicemail box is full.   Logan Nelson, CPhT [place] [facility] Office: [phone] Email: [email or web address]

## 2023-12-20 ENCOUNTER — Other Ambulatory Visit (HOSPITAL_COMMUNITY): Payer: Self-pay

## 2023-12-20 ENCOUNTER — Telehealth: Payer: Self-pay | Admitting: Pharmacy Technician

## 2023-12-20 ENCOUNTER — Other Ambulatory Visit: Payer: Self-pay

## 2023-12-20 NOTE — Progress Notes (Signed)
   12/20/2023 Name: Logan Nelson MRN: [id number] DOB: May 20, 1965  Pharmacist received referral informting that patient has had difficulty affording medications due to financial barriers Contacted patient today to discuss this referral.   Plan from last clinical pharmacist appointment:  Please educate patient that when he is due for refills of his medications, if he does not have the funds to pay that he should ask the pharmacy at [facility] to put it on a charge account. You can tell him that they will send him a bill in the mail and attempt to collect payment, but he will not be penalized for outstanding charges. This is a benefit of his [place] Medicaid insurance. Some pharmacies will not do this however, but our pharmacies will do it. It looks like he is not due for meds until around Nov 1, so this would just be education for the next time he needs medication. I agree with your plan to outreach tomorrow and again in early Nov, and you can schedule with me after that in first available on [facility] pharm (I think first available are in mid November). Let me know if you have questions or concerns. I am going to work with PCP to send refills to have on file at the pharmacy today. Thank you! (copy/paste from last note)   Medication Adherence Barriers Identified:   Access issues with any new medication or testing device: Yes Patient is due for refills of Advair , Losartan , Metoprolol , Spironolactone  and Sertraline    Medication Adherence Barriers Addressed/Actions Taken:  Reviewed medication changes per plan from last clinical pharmacist note Medication Access for Advair , Losartan , Metoprolol  Spironolactone  and Sertraline  Will discuss medication access concerns with pharmacist Contacted pharmacy regarding patient's refills on 12/20/2023 Educated patient to contact pharmacy regarding delivery and A/R account Patient returned the call from 12/19/23. Patient was informed [facility] had prescriptions ready for  him to pick up. Patient informs he does not have a ride to the pharmacy. Informed patient to call pharmacy and set up delivery and also reminded him of A/R account as PharmD relayed in above Plan. Transferred call to pharmacy for patient.  Reminded patient of date/time of upcoming clinical pharmacist follow up and any upcoming PCP/specialists visits. Patient denies transportation barriers to the appointment. No  Next clinical pharmacist appointment is scheduled for: 01/02/2024  Avamae Dehaan, CPhT [facility] Pharmacy Office: [phone] Email: [email or web address]

## 2023-12-21 ENCOUNTER — Other Ambulatory Visit: Payer: Self-pay

## 2023-12-21 ENCOUNTER — Other Ambulatory Visit (HOSPITAL_COMMUNITY): Payer: Self-pay

## 2023-12-22 ENCOUNTER — Other Ambulatory Visit (HOSPITAL_COMMUNITY): Payer: Self-pay

## 2023-12-22 ENCOUNTER — Telehealth: Payer: Self-pay | Admitting: Pharmacy Technician

## 2023-12-22 ENCOUNTER — Other Ambulatory Visit: Payer: Self-pay

## 2023-12-22 NOTE — Progress Notes (Addendum)
   12/22/2023 Name: Logan Nelson MRN: [id number] DOB: 04-22-1965  Pharmacist received referral informing that patient has had difficulty affording medications due to financial barriers Contacted patient today to discuss medication access.   Plan from last clinical pharmacist appointment:  Please educate patient that when he is due for refills of his medications, if he does not have the funds to pay that he should ask the pharmacy at [facility] to put it on a charge account. You can tell him that they will send him a bill in the mail and attempt to collect payment, but he will not be penalized for outstanding charges. This is a benefit of his [place] Medicaid insurance. Some pharmacies will not do this however, but our pharmacies will do it. It looks like he is not due for meds until around Nov 1, so this would just be education for the next time he needs medication. I agree with your plan to outreach tomorrow and again in early Nov, and you can schedule with me after that in first available on [facility] pharm (I think first available are in mid November). Let me know if you have questions or concerns. I am going to work with PCP to send refills to have on file at the pharmacy today. Thank you! (copy/paste from last note)    Medication Adherence Barriers Identified:   Access issues with any new medication or testing device: Yes Patient is due for refills of Advair , Losartan , Metoprolol , Spironolactone  and Sertraline    Medication Adherence Barriers Addressed/Actions Taken:  Medication Access for Advair , Losartan , Metoprolol , Spironolactone  and Sertraline  Will discuss medication access concerns with pharmacist Contacted pharmacy regarding new prescriptions Spoke to [place] at [facility]. She informs the aforementioned medications should be delivered today. She informs for some reason the oxycodone  was not with that shipment. Inquired if it could be shipped to patient. She informs it can be shipped to him but  it would not be delivered until Monday as they do no ship on the weekends. Will follow up with patient Monday to inquire if all medications were received.  Next clinical pharmacist appointment is scheduled for: 01/02/2024  Rushil Kimbrell, CPhT [facility] Population [facility] Pharmacy Office: [phone] Email: [email or web address]

## 2023-12-25 ENCOUNTER — Other Ambulatory Visit: Payer: Self-pay | Admitting: *Deleted

## 2023-12-25 ENCOUNTER — Telehealth: Payer: Self-pay | Admitting: Pharmacy Technician

## 2023-12-25 NOTE — Progress Notes (Signed)
   12/25/2023  Patient ID: Logan Nelson, male   DOB: 1965/07/09, 58 y.o.   MRN: [id number]  Pharmacist received referral informting that patient has had difficulty affording medications due to financial barriers. Outreach by [employer] technician was requested.   Outreached patient to discuss referral in regard to Medication Access. From a review of Medication Dispense history, blood pressure medications and inhaler were shipped 12/21/23 and delivered 12/22/23. Pain medication was picked up 11/14. Left voicemail for patient to return my call at their convenience. Of note, patient has in person visit scheduled with PharmD on 01/02/24.  Logan Nelson, CPhT [facility] [facility] Office: [phone] Email: [email or web address]

## 2023-12-25 NOTE — Patient Instructions (Signed)
 Logan Nelson - I am sorry I was unable to reach you today for our scheduled appointment. I work with Pcp, No and am calling to support your healthcare needs.  I have rescheduled your appointment for 01/30/24 @ 11 am.   Please contact me at [phone] at your earliest convenience. I look forward to speaking with you soon.   Thank you,  Antigone Crowell, RN, BSN, ACM RN Care Manager [employer] [phone]

## 2023-12-27 ENCOUNTER — Telehealth: Payer: Self-pay | Admitting: Pharmacy Technician

## 2023-12-27 NOTE — Progress Notes (Signed)
   12/27/2023 Name: Logan Nelson MRN: [id number] DOB: 1965-04-29  Pharmacist received referral informing that patient has had difficulty affording medications due to financial barriers. Contacted patient today to discuss medication access.  Plan from last clinical pharmacist appointment:  Please educate patient that when he is due for refills of his medications, if he does not have the funds to pay that he should ask the pharmacy at [facility] to put it on a charge account. You can tell him that they will send him a bill in the mail and attempt to collect payment, but he will not be penalized for outstanding charges. This is a benefit of his [place] Medicaid insurance. Some pharmacies will not do this however, but our pharmacies will do it. It looks like he is not due for meds until around Nov 1, so this would just be education for the next time he needs medication. I agree with your plan to outreach tomorrow and again in early Nov, and you can schedule with me after that in first available on [facility] pharm (I think first available are in mid November). Let me know if you have questions or concerns. I am going to work with PCP to send refills to have on file at the pharmacy today. Thank you! (copy/paste from last note)   Medication Adherence Barriers Identified:  Access issues with any new medication or testing device: Yes Patient was due for refills of Adviar, Losartan , Metoprolol , Spironolactone  and Sertraline  Inquired if patient had received the above medications. Patient informs he received those and his pain medication. He reports his pain medication is helping him as he is able to get up and move around. Patient inquired as to how to obtain refills. Informed patient to contact MD who prescribed pain medications to request a new prescription be sent to the pharmacy. Read patient the directions for the oxycodone  which says to take 1 tablet every 6 hours AS NEEDED. Patient verbalized  understanding. Informed patient to call phone number of bottle to order his blood pressure medications, Sertraline  and inhaler as listed above. Patient informs he was prescribed the inhaler before and never used it because he was not sure HOW to use it. Will forward this concern to the pharmacist who has an appointment with him next week.  Medication Adherence Barriers Addressed/Actions Taken:  Reviewed medication changes per plan from last clinical pharmacist note Educated patient to contact pharmacy regarding new prescriptions Reminded patient of date/time of upcoming clinical pharmacist follow up and any upcoming PCP/specialists visits. Patient denies transportation barriers to the appointment. No  Next clinical pharmacist appointment is scheduled for: 01/02/2024  Huldah Marin, CPhT [facility] Population [facility] Pharm[facility] Office: [phone] Email: [email or web address]

## 2024-01-02 ENCOUNTER — Other Ambulatory Visit: Payer: Self-pay

## 2024-01-02 NOTE — Progress Notes (Deleted)
 01/02/2024 Name: Logan Nelson MRN: [id number] DOB: [birth year]  No chief complaint on file.   Logan Nelson is a 58 y.o. year old male who presented for a telephone visit.   They were referred to the pharmacist by their PCP for assistance in managing medication access. SABRA PMH includes HTN, CVA, HF (EF 30-35% in 2023), hepatic steatosis, GERD, possible asthma/COPD   Subjective: Patient was last seen by PCP, Bascom Borer, NP, on 10/27/23. At last visit, BP was elevated to 147/85 mmHg, HR 57. He reported difficulty paying for medications and pharmacy technician, Jill Simcox, educated patient on use of AR account and helped facilitate refills of maintenance medications in November, which were mailed out to the patient. He did report that he did not know how to use his inhaler.  Today, patient presents in *** good spirits and presents without *** any assistance. ***Patient is accompanied by ***.    Care Team: Primary Care Provider: Bascom Borer, NP ; Next Scheduled Visit: 12/11/23 - no show, need to reschedule Cardiologist: Olivia Pavy, PA; Next Scheduled Visit: 01/22/24  Medication Access/Adherence  Current Pharmacy:  [facility] - [facility] [address] [place] [address] Phone: [phone] Fax: [phone]   Patient reports affordability concerns with their medications: {YES/NO:21197} Patient reports access/transportation concerns to their pharmacy: {YES/NO:21197} Patient reports adherence concerns with their medications:  {YES/NO:21197} ***  *** Patient denies adherence with medications, reports missing *** medications *** times per week, on average.   Heart Failure (EF 30-35% in 2023):  Current medications:  ACEi/ARB/ARNI: losartan  50 mg daily SGLT2i: none Beta blocker: metoprolol  succinate 100 mg daily Mineralocorticoid Receptor Antagonist: spironolactone  25 mg daily Diuretic regimen: none  Current home blood pressure  readings: *** Current home weights: ***  Patient {Actions; denies-reports:120008} volume overload signs or symptoms including ***shortness of breath, lower extremity edema, increased use of pillows at night   Current physical activity: ***  Current medication access support: ***  COPD/Asthma:  Current medications: fluticasone -salmeterol (Advair  HFA) 115-21 mcg/act - inhale 2 puffs BID Medications tried in the past:   Reports *** exacerbations in the past year  mMRC score: *** CAT score: ***  Current medication access support: ***    Objective:  BP Readings from Last 3 Encounters:  10/27/23 (!) 147/85  10/26/23 (!) 157/98  09/08/23 (!) 142/99    Lab Results  Component Value Date   HGBA1C 4.0 (L) 04/12/2021       Latest Ref Rng & Units 09/17/2022    7:58 AM 09/15/2022   12:39 AM 07/21/2022    2:42 PM  BMP  Glucose 70 - 99 mg/dL 883  898  891   BUN 6 - 20 mg/dL 21  18  16    Creatinine 0.61 - 1.24 mg/dL 8.80  9.16  9.04   Sodium 135 - 145 mmol/L 138  137  143   Potassium 3.5 - 5.1 mmol/L 3.9  4.7  3.9   Chloride 98 - 111 mmol/L 106  110  110   CO2 22 - 32 mmol/L 22  16  24    Calcium  8.9 - 10.3 mg/dL 9.0  9.0  9.1     Lab Results  Component Value Date   CHOL 62 04/23/2021   HDL 26 (L) 04/23/2021   LDLCALC 26 04/23/2021   TRIG 41 05/03/2021   CHOLHDL 2.4 04/23/2021    Medications Reviewed Today   Medications were not reviewed in this encounter  Assessment/Plan:   Heart Failure: - Currently {managed:26422} - Reviewed appropriate blood pressure monitoring technique and reviewed goal blood pressure - Reviewed to weigh daily and when to contact cardiology with weight gain - Reviewed dietary modifications including *** - Recommend to ***  - Meets financial criteria for *** patient assistance program through ***. Will collaborate with provider, CPhT, and patient to pursue assistance.     COPD: - Currently {CHL Controlled/Uncontrolled:[phone]}.   - Reviewed appropriate inhaler technique. - Recommend to ***  - Meets financial criteria for *** patient assistance program through ***. Will collaborate with provider, CPhT, and patient to pursue assistance.    Written patient instructions provided. Patient verbalized understanding of treatment plan.   Follow Up Plan:  Pharmacist *** PCP clinic visit in ***   Lorain Baseman, PharmD [facility] [phone]

## 2024-01-08 ENCOUNTER — Telehealth: Payer: Self-pay

## 2024-01-08 NOTE — Telephone Encounter (Signed)
 Copied from CRM #[id number]. Topic: General - Other >> Jan 03, 2024  2:14 PM Santiya F wrote: Reason for CRM: Patient is calling in because he received a call from the clinic pharmacist and he is returning the call.

## 2024-01-08 NOTE — Progress Notes (Deleted)
 " Cardiology Office Note:  .   Date:  01/08/2024  ID:  Logan Nelson, DOB Jan 02, 1966, MRN [id number] PCP: Pcp, No  [facility] Providers Cardiologist:  None { Click to update primary MD,subspecialty MD or APP then REFRESH:1}   History of Present Illness: .   Logan Nelson is a 58 y.o. male   who saw Dr. Wendel 12/2020 with sinus bradycardia chest pain and dyspnea. Dobutamine  stress unremarkable.   with acute systolic heart failure atrial fibrillation/flutter with rapid ventricular response polysubstance use cocaine, smoking, with MSSA bacteremia followed by infectious disease with recent cervical spine surgery debridement   Hospitalized 04/2021 for incarcerated abdominal hernia, had primary repair but left AMA before he could be placed in rehab -Admitted 4/3 with severe debility, protein calorie malnutrition and failure to thrive, Hospital course complicated by sepsis, MSSA bacteremia  ID following.  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   ROS: ***  Studies Reviewed: SABRA         Prior CV Studies: {Select studies to display:26339}   TTE May 2023:  1. There is a mobile filamentous structure in the RA. 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 No valvular lesions are present to suggest endocarditis.  Of note, this structure was present on the prior echo from March 2023.   2. LV myocardium is  hyper trabeculated. Consider contrast echo or MRI to  exclude noncompaction. Left ventricular ejection fraction, by estimation,  is 30 to 35%. The left ventricle has moderately decreased function. The  left ventricle demonstrates global   hypokinesis. Indeterminate diastolic filling due to E-A fusion.   3. Right ventricular systolic function is low normal. The right  ventricular size is normal. There is normal pulmonary artery systolic  pressure. The estimated right ventricular systolic pressure is 35.9 mmHg.   4. Left atrial size was severely dilated.   5. Mild to moderate mitral regurgitation is present on this study. The  prior study demonstrated possible moderate to severe MR. Would recommend  TEE for clarification. The mitral valve is grossly normal. Mild to  moderate mitral valve regurgitation. No  evidence of mitral stenosis.   6. The aortic valve is tricuspid. There is mild calcification of the  aortic valve. Aortic valve regurgitation is not visualized. Aortic valve  sclerosis is present, with no evidence of aortic valve stenosis.   7. The inferior vena cava is normal in size with greater than 50%  respiratory variability, suggesting right atrial pressure of 3 mmHg.    TTE March 2023:  1. Left ventricular ejection fraction, by estimation, is 60 to 65%. The  left ventricle has normal function. The left ventricle has no regional  wall motion abnormalities. There is mild left ventricular hypertrophy.  Left ventricular diastolic parameters  were normal.   2. Right ventricular systolic function is normal. The right ventricular  size is normal. There is normal pulmonary artery systolic pressure. The  estimated right ventricular systolic pressure is 30.2 mmHg.  3. Left atrial size was mildly dilated.   4. Cannot exclude a flail A2 leaflet. The mitral valve is myxomatous.  Moderate to severe mitral valve regurgitation. There is moderate late  systolic prolapse of the middle segment  of the anterior leaflet of the  mitral valve.   5. The aortic valve is tricuspid. Aortic valve regurgitation is not  visualized.   6. Aortic dilatation noted. There is borderline dilatation of the aortic  root, measuring 38 mm.   7. The inferior vena cava is normal in size with greater than 50%  respiratory variability, suggesting right atrial pressure of 3 mmHg.    DSE 2022:  1. Baseline echo with normal LV function, mild MR/TR/PI; mild LAE, RAE  and RVE; no stress-induced wall motion abnormalities with dobutamine   infusion; no ischemia noteed.   2. This is a negative stress echocardiogram for ischemia.   3. This is a low risk study.     Risk Assessment/Calculations:   {Does this patient have ATRIAL FIBRILLATION?:[id number]} No BP recorded.  {Refresh Note OR Click here to enter BP  :1}***       Physical Exam:   VS:  There were no vitals taken for this visit.   Orhtostatics: No data found. Wt Readings from Last 3 Encounters:  10/27/23 209 lb 9.6 oz (95.1 kg)  09/08/23 205 lb 3.2 oz (93.1 kg)  03/22/23 205 lb (93 kg)    GEN: Well nourished, well developed in no acute distress NECK: No JVD; No carotid bruits CARDIAC: ***RRR, no murmurs, rubs, gallops RESPIRATORY:  Clear to auscultation without rales, wheezing or rhonchi  ABDOMEN: Soft, non-tender, non-distended EXTREMITIES:  No edema; No deformity   ASSESSMENT AND PLAN: .    Hypertension    Cardiomyopathy with ejection fraction of 30 to 35% with moderate mitral regurgitation 4.  History of cocaine and cigarette abuse 5.  Aortic atherosclerosis on CT scan abdomen pelvis 2023 6.  Paroxysmal atrial fibrillation and atrial flutter of short duration; CV 2 score 1 not on anticoagulation due to medical noncompliance 7.  History of MSSA bacteremia       {Are you ordering a CV Procedure (e.g. stress test, cath, DCCV, TEE, etc)?   Press F2        :[id number]}  Dispo: ***  Signed, Olivia Pavy, PA-C   "

## 2024-01-18 NOTE — Telephone Encounter (Signed)
 Attempted to outreach patient to reschedule pharmacy visit. Unsuccessful outreach x2. Will request that [employer] outreach to reschedule.   Lorain Baseman, PharmD [facility] [phone]

## 2024-01-22 ENCOUNTER — Ambulatory Visit: Admitting: Physician Assistant

## 2024-01-22 ENCOUNTER — Telehealth: Payer: Self-pay

## 2024-01-22 ENCOUNTER — Telehealth: Payer: Self-pay | Admitting: Pharmacy Technician

## 2024-01-22 NOTE — Telephone Encounter (Signed)
 Outreached patient to discuss medication concerns expressed to Jill Simcox, CPhT, population marketing executive. Advised patient that we need to have him seen in clinic by provider to address his current issues. He has current supplies of his maintenance medications. Primary complaint today is swelling in his legs, pain in knees. Has ortho appt scheduled on 01/25/24, but patient reports he does not have transportation to this appt.  Attempted to provide him with the [facility] Medicaid transportation line that has previously been given to him. Patient became frustrated saying that they never show or make him late to his appt, so he refuses to call them to schedule again. Discussed that I do not have additional transportation resources to provide for him today. Patient ended call.   Lorain Baseman, PharmD [facility] [phone]

## 2024-01-22 NOTE — Progress Notes (Signed)
° °  01/22/2024 Name: Logan Nelson MRN: [id number] DOB: [birth year]  Incoming call received from patient at 9:15am this morning.HIPAA verified.  Plan from last clinical pharmacist appointment: Was notified by care guide that patient has had difficulty affording meds due to financial barriers. I have forward referral to population health pharmacy technician, Linea Calles, to educate patient on [facility] Medicaid laws that allow him to obtain medication regardless of his ability to pay. Noted that patient does not have refills of maintenance medications on file at [facility] pharmacy. Will collaborate with PCP to send refills today. Will plan to schedule patient with pharmacy after initial outreach by by [facility]. Noted that patient declined labs at last PCP appt. He has not has BMP since 2024. Must obtain repeat labs at appt on 113/2/5 for continued refills of losartan  and spironolactone (copy/paste from last note)   Medication Adherence Barriers Identified:  Patient called in to me today to inquire about an appointment he has today, where it was and what time it was. Advised patient he has an appointment at Cardiology at 9:30am. He asked if I would cancel it and reschedule it for him. Informed patient he would have to call as I do not work for that clinic and do not have access to their schedules. Provided phone number to call and reschedule. Patient also ifnorms he needs refills of his pain medication and an antibiotic he was given in ED for his back pain. He informs his back pain has come back with the change of weather. Advised patient to call his pain doctor for a refill of his pain medication. Will send message to embedded PharmD in regards to antibiotic medication patient is referring about for assistance.  Medication Adherence Barriers Addressed/Actions Taken:  Reviewed medication changes per plan from last clinical pharmacist note Medication Access for pain medication and antibiotic he was prescribed  for his back while in ED Will discuss medication access concerns with pharmacist Educated patient to contact pain doctor regarding refill of pain medication Requesting assistance from embedded PharmD regarding antibiotic for back  Next clinical pharmacist appointment is scheduled for: TBD  Kate Caddy, CPhT [facility] Office: [phone] Email: [email or web address]

## 2024-01-23 ENCOUNTER — Telehealth: Payer: Self-pay | Admitting: Pharmacy Technician

## 2024-01-23 NOTE — Progress Notes (Signed)
° °  01/23/2024 Incoming call received from  patient today to discuss refill on his pain medication.   Medication Adherence Barriers Identified:   Access issues with any new medication or testing device: Yes Patient informs he call his provider about refilling his Pain Medication and he was calling me to inquire about an update. Advised patient to call the prescriber's office back or give it a few more day as it typically takes 24-72 hours for a refill request to be fullfilled. Patient was recently advised to keep his upcoming Orthopedics appointment and to arrange transportation thru [facility] transportation system.  Medication Adherence Barriers Addressed/Actions Taken:  Reviewed medication changes per plan from last clinical pharmacist note Medication Access for pain medication per patient Will discuss medication access concerns with pharmacist Educated patient to contact prescriber or pharmacy regarding new prescriptions and refills   Next clinical pharmacist appointment is scheduled for: TBD  Kate Caddy, CPhT [facility] Office: [phone] Email: [email or web address]

## 2024-01-25 ENCOUNTER — Ambulatory Visit (HOSPITAL_BASED_OUTPATIENT_CLINIC_OR_DEPARTMENT_OTHER): Admitting: Student

## 2024-01-30 ENCOUNTER — Other Ambulatory Visit (HOSPITAL_COMMUNITY): Payer: Self-pay

## 2024-01-30 ENCOUNTER — Telehealth: Admitting: *Deleted

## 2024-01-30 ENCOUNTER — Other Ambulatory Visit: Payer: Self-pay

## 2024-01-30 NOTE — Patient Outreach (Signed)
 Complex Care Management   Visit Note  01/30/2024  Name:  Logan Nelson MRN: [id number] DOB: Jul 27, 1965  Situation: Referral received for Complex Care Management related to Heart Failure and CHG I obtained verbal consent from Patient.  Visit completed with Patient  on the phone  Background:   Past Medical History:  Diagnosis Date   Anemia    Arthritis    Bradycardia    Dyspnea    Falling episodes    GERD (gastroesophageal reflux disease)    Hernia of abdominal wall    Hypertension    Lower extremity edema     Assessment: Patient Reported Symptoms:  Cognitive Cognitive Status: Able to follow simple commands, Alert and oriented to person, place, and time, Normal speech and language skills Cognitive/Intellectual Conditions Management [RPT]: None reported or documented in medical history or problem list   Health Maintenance Behaviors: Annual physical exam, Sleep adequate Healing Pattern: Slow Health Facilitated by: Pain control, Healthy diet, Prayer/meditation, Rest, Stress management  Neurological Neurological Review of Symptoms: No symptoms reported Neurological Management Strategies: Adequate rest, Coping strategies, Routine screening Neurological Self-Management Outcome: 3 (uncertain)  HEENT HEENT Symptoms Reported: No symptoms reported HEENT Management Strategies: Routine screening HEENT Self-Management Outcome: 4 (good)    Cardiovascular Cardiovascular Symptoms Reported: Swelling in legs or feet Does patient have uncontrolled Hypertension?: Yes Is patient checking Blood Pressure at home?: No Cardiovascular Management Strategies: Routine screening, Medication therapy, Coping strategies, Adequate rest Cardiovascular Self-Management Outcome: 3 (uncertain)  Respiratory Respiratory Symptoms Reported: Shortness of breath Other Respiratory Symptoms: Occassional Shortness of breath with good recovery when he sits down and rest. Respiratory Management Strategies: Adequate  rest, Routine screening Respiratory Self-Management Outcome: 3 (uncertain)  Endocrine Endocrine Symptoms Reported: No symptoms reported Is patient diabetic?: No Endocrine Self-Management Outcome: 4 (good)  Gastrointestinal Gastrointestinal Symptoms Reported: No symptoms reported Gastrointestinal Management Strategies: Adequate rest, Coping strategies Gastrointestinal Self-Management Outcome: 4 (good)    Genitourinary Genitourinary Symptoms Reported: No symptoms reported Genitourinary Management Strategies: Adequate rest  Integumentary Integumentary Symptoms Reported: No symptoms reported Skin Management Strategies: Adequate rest Skin Self-Management Outcome: 4 (good)  Musculoskeletal Musculoskelatal Symptoms Reviewed: Unsteady gait Additional Musculoskeletal Details: Patient reports that he has a chronic pain in his left shoulder and back. Patient reports that he also has chronic back pain.  He reports that he is followed by neurosurgeon.  He reports that he also receives shots in his left arm to help with pain.  Patient reports that he has placed a called to neurosurgeons office as he needs a refill on pain medications.  Patient rates pain a 9/10 on pain scale.  Reports rest and heat helps with back pain at times. Musculoskeletal Management Strategies: Coping strategies, Routine screening, Adequate rest Musculoskeletal Self-Management Outcome: 3 (uncertain) Falls in the past year?: Yes Number of falls in past year: 2 or more Was there an injury with Fall?: No Fall Risk Category Calculator: 2 Patient Fall Risk Level: Moderate Fall Risk Patient at Risk for Falls Due to: Impaired balance/gait, Impaired mobility Fall risk Follow up: Falls evaluation completed, Education provided, Falls prevention discussed  Psychosocial Psychosocial Symptoms Reported: No symptoms reported Behavioral Management Strategies: Coping strategies Major Change/Loss/Stressor/Fears (CP): Denies Quality of Family  Relationships: helpful, involved, supportive Do you feel physically threatened by others?: No    01/30/2024    PHQ2-9 Depression Screening   Little interest or pleasure in doing things Not at all  Feeling down, depressed, or hopeless Not at all  PHQ-2 - Total Score 0  Trouble falling or staying asleep, or sleeping too much    Feeling tired or having little energy    Poor appetite or overeating     Feeling bad about yourself - or that you are a failure or have let yourself or your family down    Trouble concentrating on things, such as reading the newspaper or watching television    Moving or speaking so slowly that other people could have noticed.  Or the opposite - being so fidgety or restless that you have been moving around a lot more than usual    Thoughts that you would be better off dead, or hurting yourself in some way    PHQ2-9 Total Score    If you checked off any problems, how difficult have these problems made it for you to do your work, take care of things at home, or get along with other people    Depression Interventions/Treatment      There were no vitals filed for this visit. Pain Scale: 0-10 Pain Score: 9  Pain Type: Chronic pain Pain Location: Back (Patient also reports that his left shoulder and left lower leg hurt.) Pain Orientation: Lower Pain Intervention(s): Medication (See eMAR), Relaxation, Rest  Medications Reviewed Today     Reviewed by Jorja Nichole LABOR, RN (Case Manager) on 01/30/24 at [phone]  Med List Status: <None>   Medication Order Taking? Sig Documenting Provider Last Dose Status Informant  fexofenadine  (ALLEGRA ) 180 MG tablet [id number] Yes Take 1 tablet (180 mg total) by mouth daily. Nichols, Tonya S, NP  Active   fluticasone  (FLONASE ) 50 MCG/ACT nasal spray [id number] Yes Place 1 spray into both nostrils daily. Oley Bascom RAMAN, NP  Active   fluticasone -salmeterol (ADVAIR  HFA) 115-21 MCG/ACT inhaler [id number] Yes Inhale 2 puffs into the lungs 2  (two) times daily. Oley Bascom RAMAN, NP  Active   losartan  (COZAAR ) 50 MG tablet [id number] Yes Take 1 tablet (50 mg total) by mouth daily. Nichols, Tonya S, NP  Active   metoprolol  succinate (TOPROL -XL) 100 MG 24 hr tablet [id number] Yes Take 1 tablet (100 mg total) by mouth daily. Take with or immediately following a meal. Oley Bascom RAMAN, NP  Active   oxyCODONE  (OXY IR/ROXICODONE ) 5 MG immediate release tablet [id number]  Take 1 tablet (5 mg total) by mouth every 6 (six) hours as needed.  Patient not taking: Reported on 01/30/2024     Active   oxyCODONE  (OXY IR/ROXICODONE ) 5 MG immediate release tablet [id number]  Take 1 tablet (5 mg total) by mouth every 6 (six) hours as needed.  Patient not taking: Reported on 01/30/2024     Active   oxyCODONE -acetaminophen  (PERCOCET/ROXICET) 5-325 MG tablet [id number]  Take 1 tablet by mouth 2 (two) times daily as needed for severe pain (pain score 7-10).  Patient not taking: Reported on 01/30/2024   [name], MD  Active   sertraline  (ZOLOFT ) 25 MG tablet [id number] Yes Take 1 tablet (25 mg total) by mouth every morning. Oley Bascom RAMAN, NP  Active   spironolactone  (ALDACTONE ) 25 MG tablet [id number] Yes Take 1 tablet (25 mg total) by mouth daily. Oley Bascom RAMAN, NP  Active             Recommendation:   PCP Follow-up Specialty provider follow-up : Cardiology-02/28/24  Continue Current Plan of Care  Follow Up Plan:   Telephone follow-up in 1 month: 02/28/24  Zanyah Lentsch, RN, BSN, ACM RN Care Manager [facility] [phone]

## 2024-01-30 NOTE — Patient Instructions (Signed)
 Visit Information  [name] was given information about Medicaid Managed Care team care coordination services as a part of their [facility] Community Plan Medicaid benefit.   If you would like to schedule transportation through your [facility], please call the following number at least 2 days in advance of your appointment: [phone]   Rides for urgent appointments can also be made after hours by calling Member Services.  Call the Behavioral Health Crisis Line at [phone], at any time, 24 hours a day, 7 days a week. If you are in danger or need immediate medical attention call 911.  Please see education materials related to Heart failure and HTN provided as print materials.   Care plan and visit instructions communicated with the patient verbally today. The patient DECLINED to receive copy of care plan and patient instructions in any format.   Telephone follow up appointment with Managed Medicaid care management team member scheduled for: 03/26/24 @ 9:30 am.  Logan Qualley, RN, BSN, ACM RN Care Manager [facility] [phone]   Following is a copy of your plan of care:   Goals Addressed             This Visit's Progress    VBCI RN Care Plan-HTN   On track    Problems:  Chronic Disease Management support and education needs related to CHF and HTN  Goal: Over the next 90 days the Patient will attend all scheduled medical appointments: PCP and Specialist  as evidenced by keep all schedule appointments.         continue to work with Medical Illustrator and/or Social Worker to address care management and care coordination needs related to CHF and HTN as evidenced by adherence to care management team scheduled appointments     take all medications exactly as prescribed and will call provider for medication related questions as evidenced by compliance.      verbalize basic understanding of CHF and HTN disease process and self health  management plan as evidenced by verbal explanation, recognize and monitor symptoms and lifestyle changes.      Interventions:   Heart Failure Interventions: Basic overview and discussion of pathophysiology of Heart Failure reviewed Provided education on low sodium diet Discussed importance of daily weight and advised patient to weigh and record daily Reviewed role of diuretics in prevention of fluid overload and management of heart failure; Discussed the importance of keeping all appointments with provider Assessed social determinant of health barriers   Hypertension Interventions: Last practice recorded BP readings:  BP Readings from Last 3 Encounters:  10/27/23 (!) 147/85  10/26/23 (!) 157/98  09/08/23 (!) 142/99   Most recent eGFR/CrCl:  Lab Results  Component Value Date   EGFR 92 12/08/2020    No components found for: CRCL  Evaluation of current treatment plan related to hypertension self management and patient's adherence to plan as established by provider Provided education to patient re: stroke prevention, s/s of heart attack and stroke Reviewed medications with patient and discussed importance of compliance Discussed plans with patient for ongoing care management follow up and provided patient with direct contact information for care management team Advised patient, providing education and rationale, to monitor blood pressure daily and record, calling PCP for findings outside established parameters Provided education on prescribed diet Dash Diet Discussed complications of poorly controlled blood pressure such as heart disease, stroke, circulatory complications, vision complications, kidney impairment, sexual dysfunction Assessed social determinant of health barriers  Patient  Self-Care Activities:  Attend all scheduled provider appointments Call pharmacy for medication refills 3-7 days in advance of running out of medications Call provider office for new concerns or  questions  Take medications as prescribed   Work with the social worker to address care coordination needs and will continue to work with the clinical team to address health care and disease management related needs Work with the pharmacist to address medication management needs and will continue to work with the clinical team to address health care and disease management related needs keep legs up while sitting use salt in moderation watch for swelling in feet, ankles and legs every day eat more whole grains, fruits and vegetables, lean meats and healthy fats track symptoms and what helps feel better or worse dress right for the weather, hot or cold choose a place to take my blood pressure (home, clinic or office, retail store) learn about high blood pressure keep a blood pressure log take blood pressure log to all doctor appointments call doctor for signs and symptoms of high blood pressure keep all doctor appointments take medications for blood pressure exactly as prescribed report new symptoms to your doctor eat more whole grains, fruits and vegetables, lean meats and healthy fats limit salt intake to 1500 mg/day  Plan:  Telephone follow up appointment with care management team member scheduled for:  03/26/24 @ 9:30 am             Visit Information  Logan Nelson was given information about Medicaid Managed Care team care coordination services as a part of their [facility] Community Plan Medicaid benefit.   If you would like to schedule transportation through your [facility], please call the following number at least 2 days in advance of your appointment: [phone]   Rides for urgent appointments can also be made after hours by calling Member Services.  Call the Behavioral Health Crisis Line at [phone], at any time, 24 hours a day, 7 days a week. If you are in danger or need immediate medical attention call 911.  Please see education  materials related to Heart failure and HTN provided as print materials.   Care plan and visit instructions communicated with the patient verbally today. The patient DECLINED to receive copy of care plan and patient instructions in any format.   Telephone follow up appointment with Managed Medicaid care management team member scheduled for: 03/26/24 @ 9:30 am.   Maximos Zayas, RN, BSN, ACM RN Care Manager [facility] [phone]   Following is a copy of your plan of care:   Goals Addressed             This Visit's Progress    VBCI RN Care Plan-HTN   On track    Problems:  Chronic Disease Management support and education needs related to CHF and HTN  Goal: Over the next 90 days the Patient will attend all scheduled medical appointments: PCP and Specialist  as evidenced by keep all schedule appointments.         continue to work with Medical Illustrator and/or Social Worker to address care management and care coordination needs related to CHF and HTN as evidenced by adherence to care management team scheduled appointments     take all medications exactly as prescribed and will call provider for medication related questions as evidenced by compliance.      verbalize basic understanding of CHF and HTN disease process and self health management plan as  evidenced by verbal explanation, recognize and monitor symptoms and lifestyle changes.      Interventions:   Heart Failure Interventions: Basic overview and discussion of pathophysiology of Heart Failure reviewed Provided education on low sodium diet Discussed importance of daily weight and advised patient to weigh and record daily Reviewed role of diuretics in prevention of fluid overload and management of heart failure; Discussed the importance of keeping all appointments with provider Assessed social determinant of health barriers   Hypertension Interventions: Last practice recorded BP readings:  BP Readings from Last 3  Encounters:  10/27/23 (!) 147/85  10/26/23 (!) 157/98  09/08/23 (!) 142/99   Most recent eGFR/CrCl:  Lab Results  Component Value Date   EGFR 92 12/08/2020    No components found for: CRCL  Evaluation of current treatment plan related to hypertension self management and patient's adherence to plan as established by provider Provided education to patient re: stroke prevention, s/s of heart attack and stroke Reviewed medications with patient and discussed importance of compliance Discussed plans with patient for ongoing care management follow up and provided patient with direct contact information for care management team Advised patient, providing education and rationale, to monitor blood pressure daily and record, calling PCP for findings outside established parameters Provided education on prescribed diet Dash Diet Discussed complications of poorly controlled blood pressure such as heart disease, stroke, circulatory complications, vision complications, kidney impairment, sexual dysfunction Assessed social determinant of health barriers  Patient Self-Care Activities:  Attend all scheduled provider appointments Call pharmacy for medication refills 3-7 days in advance of running out of medications Call provider office for new concerns or questions  Take medications as prescribed   Work with the social worker to address care coordination needs and will continue to work with the clinical team to address health care and disease management related needs Work with the pharmacist to address medication management needs and will continue to work with the clinical team to address health care and disease management related needs keep legs up while sitting use salt in moderation watch for swelling in feet, ankles and legs every day eat more whole grains, fruits and vegetables, lean meats and healthy fats track symptoms and what helps feel better or worse dress right for the weather, hot or  cold choose a place to take my blood pressure (home, clinic or office, retail store) learn about high blood pressure keep a blood pressure log take blood pressure log to all doctor appointments call doctor for signs and symptoms of high blood pressure keep all doctor appointments take medications for blood pressure exactly as prescribed report new symptoms to your doctor eat more whole grains, fruits and vegetables, lean meats and healthy fats limit salt intake to 1500 mg/day  Plan:  Telephone follow up appointment with care management team member scheduled for:  03/26/24 @ 9:30 am

## 2024-02-02 ENCOUNTER — Other Ambulatory Visit: Payer: Self-pay

## 2024-02-02 MED ORDER — OXYCODONE HCL 5 MG PO TABS
5.0000 mg | ORAL_TABLET | Freq: Four times a day (QID) | ORAL | 0 refills | Status: AC
  Filled 2024-02-02: qty 20, 5d supply, fill #0

## 2024-02-05 ENCOUNTER — Other Ambulatory Visit: Payer: Self-pay

## 2024-02-05 ENCOUNTER — Telehealth: Payer: Self-pay | Admitting: Pharmacy Technician

## 2024-02-05 NOTE — Progress Notes (Signed)
" ° °  02/05/2024  Patient ID: Bennie Francis Louder, male   DOB: 02-13-1965, 58 y.o.   MRN: [id number]    02/05/2024 Name: Izeyah Deike MRN: [id number] DOB: 02-15-1965  Incoming call received from patient. HIPAA verified.  Medication Adherence Barriers Identified:  Access issues with any new medication or testing device: Yes Pain Medication per patient. Inquired if patient was speaking of Oxycodone  and patient informs yes that is the correct medication.   Medication Adherence Barriers Addressed/Actions Taken:  Reviewed medication changes per plan from last clinical pharmacist note Medication Access for Pain Medication per patient Will discuss medication access concerns with pharmacist Provided patient phone number to call [facility] as per Dr Annemarie, it appears the medication was filled on 02/02/24.   Next clinical pharmacist appointment is scheduled for: TBD  Kate Caddy, CPhT [facility] Office: [phone] Email: [email or web address]   "

## 2024-02-09 ENCOUNTER — Telehealth: Payer: Self-pay

## 2024-02-09 NOTE — Progress Notes (Signed)
 Complex Care Management Care Guide Note  02/09/2024 Name: Logan Nelson MRN: [id number] DOB: 07/07/1965  Logan Nelson is a 59 y.o. year old male who is a primary care patient of Pcp, No and is actively engaged with the care management team. I reached out to Arby Francis Louder by phone today to assist with re-scheduling  with the Pharmacist.  Follow up plan: Unsuccessful telephone outreach attempt made. A HIPAA compliant phone message was left for the patient providing contact information and requesting a return call.  [name] [facility], [facility] Guide  Direct Dial: [phone]  Fax [phone]

## 2024-02-16 NOTE — Progress Notes (Signed)
 Complex Care Management Care Guide Note  02/16/2024 Name: Logan Nelson MRN: [id number] DOB: [birth year]  [name] is a 59 y.o. year old male who is a primary care patient of Pcp, No and is actively engaged with the care management team. I reached out to [name] by phone today to assist with re-scheduling  with the Pharmacist.  Follow up plan: Unsuccessful telephone outreach attempt made. A HIPAA compliant phone message was left for the patient providing contact information and requesting a return call.  [name] [facility], [facility] Guide  Direct Dial: [phone]  Fax [phone]

## 2024-02-22 ENCOUNTER — Telehealth: Payer: Self-pay | Admitting: Pharmacy Technician

## 2024-02-22 NOTE — Progress Notes (Signed)
" ° °  02/22/2024  Patient ID: Logan Nelson, male   DOB: [birth year], 59 y.o.   MRN: [id number]  Patient engaged with clinical pharmacist for management of advice with medications on 01/22/2024 Outreach by [facility] technician was requested.   Outreached patient to discuss medication access and adherence medication management. Left voicemail for patient to return my call at their convenience.    Karsen Nakanishi, CPhT [facility] Office: [phone] Email: [email or web address]  "

## 2024-02-26 ENCOUNTER — Other Ambulatory Visit: Payer: Self-pay

## 2024-02-26 ENCOUNTER — Telehealth: Payer: Self-pay | Admitting: Pharmacy Technician

## 2024-02-26 NOTE — Progress Notes (Unsigned)
 " Cardiology Office Note:    Date:  02/26/2024   ID:  Logan Nelson, DOB [birth year], MRN [id number]  PCP:  Pcp, No   [facility] Providers Cardiologist:  None { Click to update primary MD,subspecialty MD or APP then REFRESH:1}    Referring MD: Oley Bascom RAMAN, NP   Chief complaint: Follow-up of CHF     History of Present Illness:   Logan Nelson is a 59 y.o. male with a hx of systolic heart failure, atrial fibrillation/flutter with RVR, polysubstance abuse (cocaine), tobacco use, MSSA bacteremia followed by infectious disease with cervical spine surgery debridement presenting for overdue annual follow-up.  Evaluated by Dr. Wendel in 2022 with sinus bradycardia, chest pain, dyspnea with unremarkable dobutamine  stress test.  Hospitalized in March 2023 for incarcerated abdominal hernia, had primary repair but left AMA before he could be placed in rehab.  Admitted April 2023 severely debilitated, with protein calorie malnutrition and failure to thrive, hospital course complicated by sepsis and MSSA bacteremia, followed by infectious disease.  2D echo demonstrating worsening LVEF, no vegetation.  Patient left Jun 16, 2021, however returned to the emergency department on the same night.  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  May 24 developed transient rapid A-fib with CHF, LVEF 35%, A-fib spontaneously converted on metoprolol .  No anticoagulation as it was not felt he would be a good long-term candidate for OAC use.  Previously felt to be at high risk for UTI, SGLT2i not started.  Patient did not show for his October 2023 appointment with cardiology.  Was seen 4 times  in the ED for issues of leg pain and weakness throughout 2024.  Treated for community-acquired pneumonia in September 2024 in the emergency department.  Patient did seek care with a new PCP in August 2025, who referred him to cardiology for follow-up of CHF.  CHF 06/15/2021 echo: LVEF 30-35%, LV myocardium hyper trabeculated, moderately decreased LV function, global hypokinesis, LA severely dilated, mild-moderate mitral regurg  PAF EKG Brief episode during 2023 hospitalization, spontaneously converted CHA2DS2-VASc See notes from Dr. Jeffrie, not felt to be a good long-term anticoagulation candidate  Polysubstance abuse History of cocaine use Question whether use is continued due to concurrent metoprolol  use   ROS:   Please see the history of present illness.    *** All other systems reviewed and are negative.     Past Medical History:  Diagnosis Date   Anemia    Arthritis    Bradycardia    Dyspnea    Falling episodes    GERD (gastroesophageal reflux disease)    Hernia of abdominal wall    Hypertension    Lower extremity edema     Past Surgical History:  Procedure Laterality Date   ABDOMINAL SURGERY     ANTERIOR CERVICAL DECOMP/DISCECTOMY FUSION N/A 06/23/2021   Procedure: Anterior cervical Decompression and epidural Abcess evacuation with arthodesis cervical Four -five;  Surgeon: Gillie Duncans, MD;  Location: [facility] OR;  Service: Neurosurgery;  Laterality: N/A;   INGUINAL HERNIA REPAIR Right 04/30/2021   Procedure: HERNIA REPAIR INGUINAL WITH MESH;  Surgeon: Stechschulte, Deward PARAS, MD;  Location: [facility] OR;  Service: General;  Laterality: Right;   LYSIS OF ADHESION  04/30/2021   Procedure: LYSIS  OF ADHESION;  Surgeon: Lyndel Deward PARAS, MD;  Location: [facility] OR;  Service: General;;   RADIOLOGY WITH ANESTHESIA N/A 06/16/2021   Procedure: MRI CERVICAL THORACIC LUMBER AND SACRUM;  Surgeon: Radiologist, Medication, MD;  Location: [facility] OR;  Service: Radiology;  Laterality: N/A;   RADIOLOGY WITH  ANESTHESIA N/A 06/18/2021   Procedure: MRI WITH ANESTHESIA;  Surgeon: Radiologist, Medication, MD;  Location: [facility] OR;  Service: Radiology;  Laterality: N/A;   RADIOLOGY WITH ANESTHESIA N/A 06/20/2021   Procedure: MRI WITH ANESTHESIA;  Surgeon: Radiologist, Medication, MD;  Location: [facility] OR;  Service: Radiology;  Laterality: N/A;   UMBILICAL HERNIA REPAIR N/A 04/30/2021   Procedure: OPEN UMBILICAL AND EPIGASTRIC HERNIA REPAIR;  Surgeon: Lyndel Deward PARAS, MD;  Location: [facility] OR;  Service: General;  Laterality: N/A;    Current Medications: Active Medications[1]   Allergies:   Patient has no known allergies.   Social History   Socioeconomic History   Marital status: Single    Spouse name: Not on file   Number of children: 1   Years of education: Not on file   Highest education level: High school graduate  Occupational History    Comment: pending disability  Tobacco Use   Smoking status: Some Days    Current packs/day: 0.00    Types: Cigarettes    Last attempt to quit: 04/28/2020    Years since quitting: 3.8   Smokeless tobacco: Never  Vaping Use   Vaping status: Never Used  Substance and Sexual Activity   Alcohol use: Not Currently    Comment: before admission   Drug use: Not Currently    Types: Cocaine    Comment: past history   Sexual activity: Not Currently  Other Topics Concern   Not on file  Social History Narrative   ** Merged History Encounter **       Social Drivers of Health   Tobacco Use: High Risk (11/06/2023)   Patient History    Smoking Tobacco Use: Some Days    Smokeless Tobacco Use: Never    Passive Exposure: Not on file  Financial Resource Strain: High Risk (11/22/2023)   Overall Financial Resource Strain (CARDIA)    Difficulty of Paying Living Expenses: Very hard  Food Insecurity: Food Insecurity Present (01/30/2024)   Epic    Worried About Programme Researcher, Broadcasting/film/video in the Last Year: Often true    Ran Out of Food in the Last Year: Often true   Transportation Needs: Unmet Transportation Needs (01/30/2024)   Epic    Lack of Transportation (Medical): Yes    Lack of Transportation (Non-Medical): Yes  Physical Activity: Not on file  Stress: Not on file  Social Connections: Not on file  Depression (PHQ2-9): Low Risk (01/30/2024)   Depression (PHQ2-9)    PHQ-2 Score: 0  Alcohol Screen: Low Risk (07/02/2021)   Alcohol Screen    Last Alcohol Screening Score (AUDIT): 1  Housing: High Risk (01/30/2024)   Epic    Unable to Pay for Housing in the Last Year: Yes    Number of Times Moved in the Last Year: 0    Homeless in the Last Year: Yes  Utilities: Not At Risk (12/05/2023)   Epic    Threatened with loss of utilities: No  Health Literacy: Not on file     Family History: The patient's ***family history is not on file.  EKGs/Labs/Other Studies Reviewed:    The following studies were reviewed today: ***      Recent Labs: No  results found for requested labs within last 365 days.  Recent Lipid Panel    Component Value Date/Time   CHOL 62 04/23/2021 1801   TRIG 41 05/03/2021 0435   HDL 26 (L) 04/23/2021 1801   CHOLHDL 2.4 04/23/2021 1801   VLDL 10 04/23/2021 1801   LDLCALC 26 04/23/2021 1801     Risk Assessment/Calculations:   {Does this patient have ATRIAL FIBRILLATION?:[id number]}  No BP recorded.  {Refresh Note OR Click here to enter BP  :1}***         Physical Exam:    VS:  There were no vitals taken for this visit.       Wt Readings from Last 3 Encounters:  10/27/23 209 lb 9.6 oz (95.1 kg)  09/08/23 205 lb 3.2 oz (93.1 kg)  03/22/23 205 lb (93 kg)     GEN: *** Well nourished, well developed in no acute distress HEENT: Normal NECK: No JVD; No carotid bruits CARDIAC: *** S1-S2 normal, RRR, no murmurs, rubs, gallops RESPIRATORY:  Clear to auscultation without rales, wheezing or rhonchi  MUSCULOSKELETAL:  No edema; No deformity  SKIN: Warm and dry NEUROLOGIC:  Alert and oriented x 3 PSYCHIATRIC:   Normal affect       Assessment & Plan   Disposition: *** Route to primary cardiologist      {Are you ordering a CV Procedure (e.g. stress test, cath, DCCV, TEE, etc)?   Press F2        :[id number]}    Medication Adjustments/Labs and Tests Ordered: Current medicines are reviewed at length with the patient today.  Concerns regarding medicines are outlined above.  No orders of the defined types were placed in this encounter.  No orders of the defined types were placed in this encounter.   There are no Patient Instructions on file for this visit.   Signed, Analysia Dungee E Anneth Brunell, NP  02/26/2024 9:34 PM     [facility]     [1]  No outpatient medications have been marked as taking for the 02/28/24 encounter (Appointment) with Toinette Lackie E, NP.   "

## 2024-02-26 NOTE — Progress Notes (Signed)
" ° °  02/26/2024 Name: Logan Nelson MRN: [id number] DOB: 09-01-1965  Patient is appearing for a follow-up visit with the population health pharmacy technician. Last engaged with the clinical pharmacist to discuss medication management on 01/22/2024. Contacted pharmacy today to discuss refilling medications.   Plan from last clinical pharmacist appointment:  Outreached patient to discuss medication concerns expressed to [name], CPhT, population health pharmacy technician. Advised patient that we need to have him seen in clinic by provider to address his current issues. He has current supplies of his maintenance medications. Primary complaint today is swelling in his legs, pain in knees. Has ortho appt scheduled on 01/25/24, but patient reports he does not have transportation to this appt. Attempted to provide him with the [facility] Medicaid transportation line that has previously been given to him. Patient became frustrated saying that they never show or make him late to his appt, so he refuses to call them to schedule again. Discussed that I do not have additional transportation resources to provide for him today. Patient ended call(copy/paste from last note)   Medication Adherence Barriers Identified:   Access issues with any new medication or testing device: Yes Patient needs refills of Flonase , Advair  and Allegra    Medication Adherence Barriers Addressed/Actions Taken:  Reviewed medication changes per plan from last clinical pharmacist note Medication Access for Flonase , Advair  and Allegra  Will discuss medication access concerns with pharmacist Contacted pharmacy regarding refills of Advair , Flonase  and Allegra  Spoke to [name] who informs the prescriptions have refills on them However, only Advair  and Flonase  can be billed to charge account. [name] informs Allegra  is OTC and they aren't allowed to put OTCs on charge accounts. Placed [name] on hold and alled patient to verify address.  Patient's address for this month is [address] which is what [name] has on file. Patient informs he will be returning to [address] next month.  [name], CPhT [facility] Office: [phone] Email: [name]  "

## 2024-02-26 NOTE — Progress Notes (Signed)
" ° °  02/26/2024 Name: Logan Nelson MRN: [id number] DOB: Aug 21, 1965  Patient is appearing for a follow-up visit with the population health pharmacy technician. Last engaged with the clinical pharmacist to discuss medication management on 01/22/2024. Contacted patient today to discuss medication adherence and medication access.   Plan from last clinical pharmacist appointment:  Outreached patient to discuss medication concerns expressed to Anesa Fronek, CPhT, population health pharmacy technician. Advised patient that we need to have him seen in clinic by provider to address his current issues. He has current supplies of his maintenance medications. Primary complaint today is swelling in his legs, pain in knees. Has ortho appt scheduled on 01/25/24, but patient reports he does not have transportation to this appt. Attempted to provide him with the [facility] Medicaid transportation line that has previously been given to him. Patient became frustrated saying that they never show or make him late to his appt, so he refuses to call them to schedule again. Discussed that I do not have additional transportation resources to provide for him today. Patient ended call(copy/paste from last note)   Medication Adherence Barriers Identified:  Patient made recommended medication changes per plan: Yes Patient informs he is taking his blood pressure medications. He informs he does not have any allergy medications or nasal sprays or inhalers.  Access issues with any new medication or testing device: Yes Patient informs he needs refills on Allegra , Flonase  and Advair  but he does not have a way to pay for them as he is not working. Will work with dispensing pharmacy about using A/R account for these 3 medications that he is in need of. Per Dr Annemarie, Flonase  and Allegra  last filled for 1 month supply on 09/18/2023. Advair  last filled for 1 month supply on 12/21/23. Losartan , Metoprolol , Sertraline  and Spironolactone  were all  filled for 90 day supplies on 12/21/2023. Patient was advised to call to get his transportation in order for his cardiology appointment this week. Patient informs he left them a message and they called back and left him a message and he needs to call them back. He informs he is unsure if he will be able to make it to the appointment. Encouraged patient to keep the appointment as it is important to follow up with his providers and encouraged him again to call and get his transportation lined up.  Medication Adherence Barriers Addressed/Actions Taken:  Reviewed medication changes per plan from last clinical pharmacist note Medication Access for Advair , Flonase  and Allegra  Will discuss medication access concerns with pharmacist Contacted pharmacy regarding refills  Reminded patient of date/time of upcoming clinical pharmacist follow up and any upcoming PCP/specialists visits. Patient denies transportation barriers to the appointment. No  Next clinical pharmacist appointment is scheduled for: 03/01/2023 at 11am  Kate Caddy, CPhT [facility] Office: [phone] Email: [email or web address]  "

## 2024-02-28 ENCOUNTER — Ambulatory Visit: Admitting: Emergency Medicine

## 2024-02-29 ENCOUNTER — Other Ambulatory Visit (HOSPITAL_COMMUNITY): Payer: Self-pay

## 2024-02-29 ENCOUNTER — Other Ambulatory Visit: Payer: Self-pay

## 2024-02-29 ENCOUNTER — Telehealth: Payer: Self-pay | Admitting: Pharmacy Technician

## 2024-02-29 NOTE — Progress Notes (Signed)
" ° °  02/29/2024 Name: Logan Nelson MRN: [id number] DOB: Apr 06, 1965  Patient is appearing for a follow-up visit with the population health pharmacy technician. Last engaged with the clinical pharmacist to discuss medication management on 01/22/2024. Contacted patient today to discuss medication adherence.   Plan from last clinical pharmacist appointment:  Outreached patient to discuss medication concerns expressed to Logan Nelson, CPhT, population health pharmacy technician. Advised patient that we need to have him seen in clinic by provider to address his current issues. He has current supplies of his maintenance medications. Primary complaint today is swelling in his legs, pain in knees. Has ortho appt scheduled on 01/25/24, but patient reports he does not have transportation to this appt. Attempted to provide him with the [facility] Medicaid transportation line that has previously been given to him. Patient became frustrated saying that they never show or make him late to his appt, so he refuses to call them to schedule again. Discussed that I do not have additional transportation resources to provide for him today. Patient ended call (copy/paste from last note)   Medication Adherence Barriers Identified:  Patient made recommended medication changes per plan: Yes Patient informs he is taking the medications prescribed for him except he is out of an inhaler. Access issues with any Logan medication or testing device: Yes Per fill history data at [facility], patient should be out of Allegra ,Flonase  and Advair . Sertraline , Spironolactone , Metoprolol  XL and Losartan  were all filled on 12/21/23 for 90 day supplies Inquired from PharmD if patient should continue on Allergra and Flonase  and she informs it would be okay to re order these medications as well as Advair  Patient had f/u scheduled with PharmD today but PharmD was not able to call the patient due to a crisis with another patient. Outreached patient to apologize  for PharmD missing appointment but informed him that she would outreach him later this afternoon or sometime next week and patient verbalized understanding. Patient did inform he has not received the inhalers. Will call pharmacy to follow up. Patient has barriers to transportation.  Medication Adherence Barriers Addressed/Actions Taken:  Reviewed medication changes per plan from last clinical pharmacist note Medication Access for Allegra , Flonase  and Advair  Will discuss medication access concerns with pharmacist Contacted pharmacy regarding refills Refills were called into Logan Nelson on 02/1924 and requested they be mailed to patient on A/R account. At that time, Logan Nelson informs Allegra  can not be put on A/R account due to it being OTC. Followed up with pharmacy today. Spoke to Logan Nelson. Logan Nelson informs Advair  and Flonase  were ready for patient to pick up. Informed Logan Nelson that these were to be mailed per previous phone call with Logan Nelson. She informs she will get them ready to mail out but patient would not receive until Monday or Tuesday next week.  Next clinical pharmacist appointment is scheduled for: TBD  Logan Nelson, CPhT [facility] Office: [phone] Email: [email or web address]   "

## 2024-03-07 ENCOUNTER — Other Ambulatory Visit

## 2024-03-07 ENCOUNTER — Other Ambulatory Visit (HOSPITAL_COMMUNITY): Payer: Self-pay

## 2024-03-07 ENCOUNTER — Telehealth: Payer: Self-pay

## 2024-03-07 NOTE — Telephone Encounter (Signed)
 Attempted to contact patient for scheduled appointment for medication management. Unable to leave VM (full).   Lorain Baseman, PharmD [facility] [phone]

## 2024-03-07 NOTE — Progress Notes (Unsigned)
" ° °  03/07/2024 Name: Logan Nelson MRN: [id number] DOB: 24-Sep-1965  No chief complaint on file.   {Visit Type:26650}. PMH includes ***   Subjective: Patient was last seen by PCP, ***, on ***. At last visit, ***  Today, patient presents in *** good spirits and presents without *** any assistance. ***Patient is accompanied by ***.    Care Team: Primary Care Provider: Pcp, No ; Next Scheduled Visit: *** {careteamprovider:27366}  Medication Access/Adherence  Current Pharmacy:  [facility] [address] [place] [address] Phone: [phone] Fax: [phone]   Patient reports affordability concerns with their medications: {YES/NO:21197} Patient reports access/transportation concerns to their pharmacy: {YES/NO:21197} Patient reports adherence concerns with their medications:  {YES/NO:21197} ***  *** Patient denies adherence with medications, reports missing *** medications *** times per week, on average.   {Pharmacy S/O Choices:26420}   Objective:  BP Readings from Last 3 Encounters:  10/27/23 (!) 147/85  10/26/23 (!) 157/98  09/08/23 (!) 142/99    Lab Results  Component Value Date   HGBA1C 4.0 (L) 04/12/2021       Latest Ref Rng & Units 09/17/2022    7:58 AM 09/15/2022   12:39 AM 07/21/2022    2:42 PM  BMP  Glucose 70 - 99 mg/dL 883  898  891   BUN 6 - 20 mg/dL 21  18  16    Creatinine 0.61 - 1.24 mg/dL 8.80  9.16  9.04   Sodium 135 - 145 mmol/L 138  137  143   Potassium 3.5 - 5.1 mmol/L 3.9  4.7  3.9   Chloride 98 - 111 mmol/L 106  110  110   CO2 22 - 32 mmol/L 22  16  24    Calcium  8.9 - 10.3 mg/dL 9.0  9.0  9.1     Lab Results  Component Value Date   CHOL 62 04/23/2021   HDL 26 (L) 04/23/2021   LDLCALC 26 04/23/2021   TRIG 41 05/03/2021   CHOLHDL 2.4 04/23/2021    Medications Reviewed Today   Medications were not reviewed in this encounter       Assessment/Plan:   {Pharmacy A/P  Choices:26421}  Written patient instructions provided. Patient verbalized understanding of treatment plan.   Follow Up Plan: *** Pharmacist *** PCP clinic visit in *** Patient seen with ***  ***  "

## 2024-03-08 ENCOUNTER — Other Ambulatory Visit: Payer: Self-pay

## 2024-03-08 NOTE — Patient Instructions (Signed)
 Logan Nelson - I am sorry I was unable to reach you today for our scheduled appointment. I work with Pcp, No and am calling to support your healthcare needs. Please contact me at [phone] at your earliest convenience. I look forward to speaking with you soon.   Thank you,  Orlean Fey, BSW [place]  [facility] Social Worker, [employer] [phone]

## 2024-03-14 ENCOUNTER — Encounter (HOSPITAL_BASED_OUTPATIENT_CLINIC_OR_DEPARTMENT_OTHER): Payer: Self-pay

## 2024-03-15 ENCOUNTER — Telehealth: Payer: Self-pay

## 2024-03-15 ENCOUNTER — Other Ambulatory Visit: Payer: Self-pay

## 2024-03-15 ENCOUNTER — Telehealth: Payer: Self-pay | Admitting: Pharmacy Technician

## 2024-03-15 NOTE — Patient Outreach (Signed)
 Patient called BSW to reschedule appointment. BSW will speak with patient on 2/10 at 1pm.  Orlean Fey, BSW [place]  [facility] Social Worker, [employer] [facility] [phone]

## 2024-03-15 NOTE — Patient Instructions (Signed)
 Logan Nelson - I have attempted to call you three times but have been unsuccessful in reaching you. I work with Pcp, No and am calling to support your healthcare needs. If I can be of assistance to you, please contact me at [phone].     Orlean Fey, BSW [place]  [facility] Social Worker, [employer] [facility] [phone]

## 2024-03-15 NOTE — Progress Notes (Cosign Needed)
" ° °  03/15/2024  Patient ID: Logan Nelson, male   DOB: 09-Jul-1965, 59 y.o.   MRN: [id number]  Incoming call received from patient. HIPAA verified. Patient informs he was returning my call. After chart review, BSW Orlean Fey was attempting to reach patient. Provided patient phone number for BSW. He informs he will call her today.  Chanci Ojala, CPhT [facility] Office: [phone] Email: [email or web address]  "

## 2024-03-19 ENCOUNTER — Telehealth

## 2024-03-25 ENCOUNTER — Telehealth

## 2024-03-26 ENCOUNTER — Telehealth: Admitting: *Deleted

## 2024-04-29 ENCOUNTER — Ambulatory Visit: Payer: Self-pay | Admitting: Nurse Practitioner
